# Patient Record
Sex: Male | Born: 1960 | Race: Black or African American | Hispanic: No | Smoking: Former smoker
Health system: Southern US, Community
[De-identification: ages and names within clinical notes are randomized; demographics above are authoritative.]

## PROBLEM LIST (undated history)

## (undated) DIAGNOSIS — J189 Pneumonia, unspecified organism: Secondary | ICD-10-CM

## (undated) DIAGNOSIS — J45909 Unspecified asthma, uncomplicated: Secondary | ICD-10-CM

## (undated) DIAGNOSIS — I82409 Acute embolism and thrombosis of unspecified deep veins of unspecified lower extremity: Secondary | ICD-10-CM

## (undated) DIAGNOSIS — I10 Essential (primary) hypertension: Secondary | ICD-10-CM

## (undated) DIAGNOSIS — Z9989 Dependence on other enabling machines and devices: Secondary | ICD-10-CM

## (undated) DIAGNOSIS — Z86718 Personal history of other venous thrombosis and embolism: Secondary | ICD-10-CM

## (undated) DIAGNOSIS — I509 Heart failure, unspecified: Secondary | ICD-10-CM

## (undated) DIAGNOSIS — N189 Chronic kidney disease, unspecified: Secondary | ICD-10-CM

## (undated) DIAGNOSIS — N289 Disorder of kidney and ureter, unspecified: Secondary | ICD-10-CM

## (undated) DIAGNOSIS — G4733 Obstructive sleep apnea (adult) (pediatric): Secondary | ICD-10-CM

## (undated) DIAGNOSIS — M109 Gout, unspecified: Secondary | ICD-10-CM

## (undated) DIAGNOSIS — I119 Hypertensive heart disease without heart failure: Secondary | ICD-10-CM

## (undated) DIAGNOSIS — I2699 Other pulmonary embolism without acute cor pulmonale: Secondary | ICD-10-CM

## (undated) DIAGNOSIS — G4730 Sleep apnea, unspecified: Secondary | ICD-10-CM

## (undated) DIAGNOSIS — J449 Chronic obstructive pulmonary disease, unspecified: Secondary | ICD-10-CM

## (undated) DIAGNOSIS — I480 Paroxysmal atrial fibrillation: Secondary | ICD-10-CM

## (undated) HISTORY — DX: Disorder of kidney and ureter, unspecified: N28.9

## (undated) HISTORY — DX: Sleep apnea, unspecified: G47.30

## (undated) HISTORY — DX: Acute embolism and thrombosis of unspecified deep veins of unspecified lower extremity: I82.409

## (undated) HISTORY — DX: Personal history of other venous thrombosis and embolism: Z86.718

## (undated) HISTORY — DX: Paroxysmal atrial fibrillation: I48.0

## (undated) HISTORY — DX: Hypertensive heart disease without heart failure: I11.9

## (undated) HISTORY — DX: Chronic kidney disease, unspecified: N18.9

## (undated) HISTORY — DX: Unspecified asthma, uncomplicated: J45.909

## (undated) HISTORY — DX: Chronic obstructive pulmonary disease, unspecified: J44.9

---

## 1990-03-24 HISTORY — PX: UMBILICAL HERNIA REPAIR: SHX196

## 2006-05-28 ENCOUNTER — Emergency Department: Payer: Self-pay | Admitting: Emergency Medicine

## 2006-05-28 ENCOUNTER — Other Ambulatory Visit: Payer: Self-pay

## 2006-06-08 ENCOUNTER — Ambulatory Visit: Payer: Self-pay | Admitting: Internal Medicine

## 2006-06-13 ENCOUNTER — Ambulatory Visit: Payer: Self-pay | Admitting: Internal Medicine

## 2007-02-08 ENCOUNTER — Emergency Department: Payer: Self-pay | Admitting: Emergency Medicine

## 2007-02-12 ENCOUNTER — Emergency Department: Payer: Self-pay | Admitting: Emergency Medicine

## 2007-02-16 ENCOUNTER — Emergency Department: Payer: Self-pay | Admitting: Emergency Medicine

## 2007-02-21 ENCOUNTER — Emergency Department: Payer: Self-pay | Admitting: Emergency Medicine

## 2007-02-24 ENCOUNTER — Ambulatory Visit: Payer: Self-pay | Admitting: Cardiology

## 2007-02-25 ENCOUNTER — Ambulatory Visit: Payer: Self-pay | Admitting: Cardiology

## 2007-02-25 ENCOUNTER — Encounter: Payer: Self-pay | Admitting: Cardiology

## 2007-02-25 ENCOUNTER — Inpatient Hospital Stay (HOSPITAL_COMMUNITY): Admission: EM | Admit: 2007-02-25 | Discharge: 2007-02-27 | Payer: Self-pay | Admitting: Emergency Medicine

## 2007-08-30 ENCOUNTER — Observation Stay (HOSPITAL_COMMUNITY): Admission: EM | Admit: 2007-08-30 | Discharge: 2007-08-31 | Payer: Self-pay | Admitting: Emergency Medicine

## 2007-08-31 ENCOUNTER — Ambulatory Visit: Payer: Self-pay | Admitting: Internal Medicine

## 2007-08-31 ENCOUNTER — Encounter (INDEPENDENT_AMBULATORY_CARE_PROVIDER_SITE_OTHER): Payer: Self-pay | Admitting: Cardiovascular Disease

## 2007-09-01 ENCOUNTER — Other Ambulatory Visit: Payer: Self-pay | Admitting: *Deleted

## 2007-09-01 ENCOUNTER — Encounter (INDEPENDENT_AMBULATORY_CARE_PROVIDER_SITE_OTHER): Payer: Self-pay | Admitting: *Deleted

## 2008-01-24 ENCOUNTER — Emergency Department (HOSPITAL_COMMUNITY): Admission: EM | Admit: 2008-01-24 | Discharge: 2008-01-24 | Payer: Self-pay | Admitting: Emergency Medicine

## 2008-02-22 ENCOUNTER — Emergency Department (HOSPITAL_COMMUNITY): Admission: EM | Admit: 2008-02-22 | Discharge: 2008-02-22 | Payer: Self-pay | Admitting: Emergency Medicine

## 2008-03-24 DIAGNOSIS — I2699 Other pulmonary embolism without acute cor pulmonale: Secondary | ICD-10-CM

## 2008-03-24 HISTORY — DX: Other pulmonary embolism without acute cor pulmonale: I26.99

## 2008-05-17 ENCOUNTER — Encounter: Payer: Self-pay | Admitting: Internal Medicine

## 2008-08-22 ENCOUNTER — Encounter: Payer: Self-pay | Admitting: Internal Medicine

## 2008-08-29 ENCOUNTER — Inpatient Hospital Stay (HOSPITAL_COMMUNITY): Admission: EM | Admit: 2008-08-29 | Discharge: 2008-08-31 | Payer: Self-pay | Admitting: Emergency Medicine

## 2008-08-29 ENCOUNTER — Ambulatory Visit: Payer: Self-pay | Admitting: Vascular Surgery

## 2008-08-29 ENCOUNTER — Encounter (INDEPENDENT_AMBULATORY_CARE_PROVIDER_SITE_OTHER): Payer: Self-pay | Admitting: Internal Medicine

## 2008-10-11 ENCOUNTER — Other Ambulatory Visit: Payer: Self-pay | Admitting: Internal Medicine

## 2008-11-01 ENCOUNTER — Other Ambulatory Visit: Payer: Self-pay | Admitting: Family Medicine

## 2009-05-07 ENCOUNTER — Emergency Department (HOSPITAL_COMMUNITY): Admission: EM | Admit: 2009-05-07 | Discharge: 2009-05-07 | Payer: Self-pay | Admitting: Emergency Medicine

## 2009-08-10 ENCOUNTER — Emergency Department (HOSPITAL_COMMUNITY): Admission: EM | Admit: 2009-08-10 | Discharge: 2009-08-10 | Payer: Self-pay | Admitting: Emergency Medicine

## 2010-05-14 ENCOUNTER — Emergency Department (HOSPITAL_COMMUNITY): Payer: Managed Care, Other (non HMO)

## 2010-05-14 ENCOUNTER — Inpatient Hospital Stay (HOSPITAL_COMMUNITY)
Admission: EM | Admit: 2010-05-14 | Discharge: 2010-05-16 | DRG: 310 | Disposition: A | Discharge status: Home / Self Care | Payer: Managed Care, Other (non HMO) | Attending: Internal Medicine | Admitting: Internal Medicine

## 2010-05-14 ENCOUNTER — Emergency Department (HOSPITAL_COMMUNITY)
Admission: EM | Admit: 2010-05-14 | Discharge: 2010-05-14 | Disposition: A | Discharge status: Home / Self Care | Payer: Managed Care, Other (non HMO) | Attending: Emergency Medicine | Admitting: Emergency Medicine

## 2010-05-14 DIAGNOSIS — R059 Cough, unspecified: Secondary | ICD-10-CM | POA: Insufficient documentation

## 2010-05-14 DIAGNOSIS — R05 Cough: Secondary | ICD-10-CM | POA: Insufficient documentation

## 2010-05-14 DIAGNOSIS — M79609 Pain in unspecified limb: Secondary | ICD-10-CM | POA: Diagnosis present

## 2010-05-14 DIAGNOSIS — G4733 Obstructive sleep apnea (adult) (pediatric): Secondary | ICD-10-CM | POA: Diagnosis present

## 2010-05-14 DIAGNOSIS — R748 Abnormal levels of other serum enzymes: Secondary | ICD-10-CM | POA: Diagnosis present

## 2010-05-14 DIAGNOSIS — N289 Disorder of kidney and ureter, unspecified: Secondary | ICD-10-CM | POA: Diagnosis present

## 2010-05-14 DIAGNOSIS — J209 Acute bronchitis, unspecified: Secondary | ICD-10-CM | POA: Diagnosis present

## 2010-05-14 DIAGNOSIS — J45909 Unspecified asthma, uncomplicated: Secondary | ICD-10-CM | POA: Insufficient documentation

## 2010-05-14 DIAGNOSIS — R071 Chest pain on breathing: Secondary | ICD-10-CM | POA: Insufficient documentation

## 2010-05-14 DIAGNOSIS — I10 Essential (primary) hypertension: Secondary | ICD-10-CM | POA: Diagnosis present

## 2010-05-14 DIAGNOSIS — I498 Other specified cardiac arrhythmias: Principal | ICD-10-CM | POA: Diagnosis present

## 2010-05-14 DIAGNOSIS — E876 Hypokalemia: Secondary | ICD-10-CM | POA: Diagnosis present

## 2010-05-14 DIAGNOSIS — Z86718 Personal history of other venous thrombosis and embolism: Secondary | ICD-10-CM

## 2010-05-14 DIAGNOSIS — E860 Dehydration: Secondary | ICD-10-CM | POA: Diagnosis present

## 2010-05-14 LAB — CBC
HCT: 47.5 % (ref 39.0–52.0)
Hemoglobin: 15.1 g/dL (ref 13.0–17.0)
MCH: 26.6 pg (ref 26.0–34.0)
RBC: 5.67 MIL/uL (ref 4.22–5.81)

## 2010-05-14 LAB — BASIC METABOLIC PANEL
BUN: 26 mg/dL — ABNORMAL HIGH (ref 6–23)
Calcium: 9 mg/dL (ref 8.4–10.5)
Creatinine, Ser: 2.54 mg/dL — ABNORMAL HIGH (ref 0.4–1.5)
GFR calc Af Amer: 33 mL/min — ABNORMAL LOW (ref >60)
Potassium: 4.1 mEq/L (ref 3.5–5.1)
Sodium: 138 mEq/L (ref 135–145)

## 2010-05-14 LAB — COMPREHENSIVE METABOLIC PANEL
ALT: 22 U/L (ref 0–53)
AST: 32 U/L (ref 0–37)
Albumin: 3.5 g/dL (ref 3.5–5.2)
CO2: 32 mEq/L (ref 19–32)
Chloride: 95 mEq/L — ABNORMAL LOW (ref 96–112)
Creatinine, Ser: 2.47 mg/dL — ABNORMAL HIGH (ref 0.4–1.5)
GFR calc Af Amer: 34 mL/min — ABNORMAL LOW (ref >60)
GFR calc non Af Amer: 28 mL/min — ABNORMAL LOW (ref >60)
Sodium: 138 mEq/L (ref 135–145)
Total Bilirubin: 0.5 mg/dL (ref 0.3–1.2)

## 2010-05-14 LAB — CARDIAC PANEL(CRET KIN+CKTOT+MB+TROPI)
CK, MB: 14.6 ng/mL (ref 0.3–4.0)
Relative Index: 2.6 — ABNORMAL HIGH (ref 0.0–2.5)

## 2010-05-14 LAB — BRAIN NATRIURETIC PEPTIDE: Pro B Natriuretic peptide (BNP): 36.9 pg/mL (ref 0.0–100.0)

## 2010-05-14 LAB — D-DIMER, QUANTITATIVE: D-Dimer, Quant: 0.4 ug/mL-FEU (ref 0.00–0.48)

## 2010-05-14 LAB — DIFFERENTIAL
Basophils Absolute: 0 10*3/uL (ref 0.0–0.1)
Basophils Relative: 0 % (ref 0–1)
Eosinophils Absolute: 0 10*3/uL (ref 0.0–0.7)
Eosinophils Relative: 0 % (ref 0–5)
Lymphocytes Relative: 18 % (ref 12–46)
Lymphs Abs: 0.9 10*3/uL (ref 0.7–4.0)
Monocytes Absolute: 0.2 10*3/uL (ref 0.1–1.0)

## 2010-05-14 MED ORDER — XENON XE 133 GAS
10.7000 | GAS_FOR_INHALATION | Freq: Once | RESPIRATORY_TRACT | Status: AC | PRN
  Administered 2010-05-14: 11 via RESPIRATORY_TRACT

## 2010-05-14 MED ORDER — TECHNETIUM TO 99M ALBUMIN AGGREGATED
4.0000 | Freq: Once | INTRAVENOUS | Status: AC | PRN
  Administered 2010-05-14: 4 via INTRAVENOUS

## 2010-05-15 LAB — CARDIAC PANEL(CRET KIN+CKTOT+MB+TROPI)
Total CK: 557 U/L — ABNORMAL HIGH (ref 7–232)
Troponin I: 0.12 ng/mL — ABNORMAL HIGH (ref 0.00–0.06)

## 2010-05-15 LAB — URINALYSIS, ROUTINE W REFLEX MICROSCOPIC
Bilirubin Urine: NEGATIVE
Hgb urine dipstick: NEGATIVE
Nitrite: NEGATIVE
Specific Gravity, Urine: 1.022 (ref 1.005–1.030)
Urobilinogen, UA: 1 mg/dL (ref 0.0–1.0)
pH: 5 (ref 5.0–8.0)

## 2010-05-15 LAB — BASIC METABOLIC PANEL
BUN: 25 mg/dL — ABNORMAL HIGH (ref 6–23)
Calcium: 9 mg/dL (ref 8.4–10.5)
Creatinine, Ser: 1.9 mg/dL — ABNORMAL HIGH (ref 0.4–1.5)
GFR calc Af Amer: 46 mL/min — ABNORMAL LOW (ref >60)
GFR calc non Af Amer: 38 mL/min — ABNORMAL LOW (ref >60)

## 2010-05-15 LAB — LIPID PANEL
Cholesterol: 165 mg/dL (ref 0–200)
HDL: 62 mg/dL (ref >39)
LDL Cholesterol: 91 mg/dL (ref 0–99)
Total CHOL/HDL Ratio: 2.7 RATIO
Triglycerides: 59 mg/dL (ref <150)

## 2010-05-15 LAB — HEMOGLOBIN A1C
Hgb A1c MFr Bld: 6.5 % — ABNORMAL HIGH (ref <5.7)
Mean Plasma Glucose: 140 mg/dL — ABNORMAL HIGH (ref <117)

## 2010-05-15 LAB — TSH: TSH: 0.857 u[IU]/mL (ref 0.350–4.500)

## 2010-05-15 LAB — CBC
MCH: 26.9 pg (ref 26.0–34.0)
MCHC: 31.5 g/dL (ref 30.0–36.0)
Platelets: 183 10*3/uL (ref 150–400)

## 2010-05-15 LAB — HEPARIN LEVEL (UNFRACTIONATED): Heparin Unfractionated: 0.67 IU/mL (ref 0.30–0.70)

## 2010-05-15 LAB — NA AND K (SODIUM & POTASSIUM), RAND UR: Potassium Urine: 65 mEq/L

## 2010-05-15 LAB — CREATININE, URINE, RANDOM: Creatinine, Urine: 231.5 mg/dL

## 2010-05-16 LAB — CBC
HCT: 44.3 % (ref 39.0–52.0)
MCH: 26.4 pg (ref 26.0–34.0)
MCV: 86.5 fL (ref 78.0–100.0)
RDW: 13.4 % (ref 11.5–15.5)
WBC: 6.3 10*3/uL (ref 4.0–10.5)

## 2010-05-16 LAB — URINE CULTURE: Culture  Setup Time: 201202220420

## 2010-05-30 NOTE — Discharge Summary (Signed)
NAMETARRANCE, JAGIELLO NO.:  [id number]  MEDICAL RECORD NO.:  [id number]           PATIENT TYPE:  I  LOCATION:  [id number]                         FACILITY:  [facility]:  Leana Gamer, MDDATE OF BIRTH:  January 06, 1961  DATE OF ADMISSION:  05/14/2010 DATE OF DISCHARGE:  05/16/2010                              DISCHARGE SUMMARY   DISCHARGE DISPOSITION:  Home.  FINAL DISCHARGE DIAGNOSES: 1. Nonsustained supraventricular tachycardia. 2. Obstructive sleep apnea. 3. Morbid obesity. 4. Hypertension. 5. Acute bronchitis. 6. Hypokalemia, resolved. 7. Abnormal cardiac enzymes, felt to be nonischemic. 8. Remote history of pulmonary emboli and deep vein thrombosis. 9. Acute renal insufficiency, resolved. 10.Dehydration, resolved.  DISCHARGE MEDICATIONS: 1. Albuterol inhaler 1 puff q.4h. p.r.n. 2. Prednisone taper over 4 days from 40 mg down to 10 mg p.o. daily. 3. Clonidine 0.1 mg p.o. daily. 4. Hydrochlorothiazide 25 mg p.o. daily. 5. Norvasc 10 mg p.o. daily. 6. Potassium chloride 20 mEq p.o. daily.  CONSULTANTS:  Ezzard Standing, MD, Cardiology  PROCEDURES:  None.  DIAGNOSTIC STUDIES: 1. A 12-lead EKG which shows normal sinus rhythm on today. 2. V/Q scan which showed low probability for pulmonary embolus. 3. Two-view chest x-ray which shows mild cardiomegaly and no active     disease. 4. Two-view chest x-ray which shows no acute disease.  PRIMARY CARE PHYSICIAN:  Unassigned in this area.  Primary physician in [place], [place].  The patient unable to give the doctor's name.  CODE STATUS:  Full code.  ALLERGIES:  CONTRAST MEDIA.  CHIEF COMPLAINTS:  Chest tightness, left arm pain, and diaphoresis.  HISTORY OF PRESENT ILLNESS:  Please refer to the H and P by Dr. Raelyn Number for details of the HPI.  However, in short this is a very morbidly obese gentleman with a history of remote DVT and pulmonary embolus, who presented with chest pain and the  above symptoms.  HOSPITAL COURSE: 1. Chest tightness, left arm pain, and diaphoresis.  The patient had     his cardiac enzymes cycled.  He had some mild elevation in his     cardiac enzymes and Dr. Wynonia Lawman from Cardiology was consulted.  He     felt that the elevation of cardiac enzymes were noncardiac and were     likely related to his renal function.  He also diagnosed the     patient with nonsustained supraventricular tachycardia.  It was     felt that the patient's symptoms were likely secondary to the NSVT.     The patient was started on a Cardizem drip, which controlled the     rate and was then discontinued.  He was continued on his clonidine     0.1.  On today, the patient has a normal sinus rhythm on EKG.  He     is ambulating without any symptoms and his heart rate is controlled     in the 80s.  The patient is to follow up with his primary care     physician for further evaluation and management. 2. Acute bronchitis.  The patient did have some wheezing felt to  be     secondary to acute bronchitis.  The patient apparently had been     started on a prednisone taper and unbeknownst was taking his     prednisone here in the hospital.  The patient is continued on a     prednisone taper over the next 4 days.  The patient also has an     albuterol inhaler to be used as needed. 3. Hypertension.  The patient does have hypertension.  However, I will     defer to his primary care physician for further titration.  He is     continued on his clonidine, Norvasc, and hydrochlorothiazide. 4. Hypokalemia.  The patient had some mild derangements in his     potassium and this has been replaced.  He is continued on potassium     supplementation. 5. Morbid obesity, obstructive sleep apnea.  The patient's body     habitus certainly lends itself a diagnosis of obstructive sleep     apnea.  The patient states that he was studied on a polysomnogram     approximately 2 years ago and was given a  prescription for CPAP.     He states, however, that he had wore the CPAP in the hospital and     he could not tolerate it, thus he did not continue with the CPAP.     I had a long discussion with the patient and he is in agreement     with it.  We are arranging for him to have an outpatient     polysomnogram and to proceed further from there.  Condition at time of discharge is stable.  Vital signs as follows; temperature 98.6, heart rate 80, blood pressure 172/83, respiratory rate 20, O2 sats are 92% on room air.  The patient does have sats dropping into the 80s during sleep and will need to have O2 available for sleep overnight.  Lungs, the patient only has some very, very incidental and occasional mild diffuse wheezing. Cardiovascular, he has got a normal S1 and S2.  He has normal sinus rhythm on the monitor.  There are no murmurs, rubs, or gallops noted. He is also in normal sinus rhythm with a 12-lead EKG.  Abdomen is morbidly obese, soft, nontender, nondistended.  I cannot appreciate any masses or hepatosplenomegaly.  Extremities show no clubbing, cyanosis, or edema.  Neurological, there no focal neurological deficits.  Cranial nerves II-XII are grossly intact.  Psychiatric, he is alert and oriented x3.  Good insight and cognition with recent and remote recall.  FOLLOWUP:  The patient will follow up with his primary care physician within 1 week.  The patient will also follow up for his outpatient polysomnogram for further treatment of his obstructive sleep apnea.  Total time for this discharge is greater than 30 minutes.     Leana Gamer, MD     MAM/MEDQ  D:  05/16/2010  T:  05/16/2010  Job:  [id number]  Electronically Signed by Liston Alba MD on 05/29/2010 09:50:55 PM

## 2010-06-10 LAB — POCT I-STAT, CHEM 8
BUN: 18 mg/dL (ref 6–23)
Creatinine, Ser: 1.3 mg/dL (ref 0.4–1.5)
Potassium: 3 mEq/L — ABNORMAL LOW (ref 3.5–5.1)
Sodium: 139 mEq/L (ref 135–145)

## 2010-06-10 LAB — DIFFERENTIAL
Eosinophils Relative: 1 % (ref 0–5)
Lymphocytes Relative: 33 % (ref 12–46)
Lymphs Abs: 1.5 10*3/uL (ref 0.7–4.0)
Monocytes Relative: 8 % (ref 3–12)
Neutrophils Relative %: 57 % (ref 43–77)

## 2010-06-10 LAB — CBC
HCT: 40.6 % (ref 39.0–52.0)
Platelets: 178 10*3/uL (ref 150–400)
RBC: 4.8 MIL/uL (ref 4.22–5.81)
WBC: 4.7 10*3/uL (ref 4.0–10.5)
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
## 2010-06-19 ENCOUNTER — Ambulatory Visit (HOSPITAL_BASED_OUTPATIENT_CLINIC_OR_DEPARTMENT_OTHER): Payer: Self-pay

## 2010-06-19 ENCOUNTER — Ambulatory Visit (HOSPITAL_BASED_OUTPATIENT_CLINIC_OR_DEPARTMENT_OTHER): Payer: Managed Care, Other (non HMO) | Attending: Internal Medicine

## 2010-06-19 DIAGNOSIS — G4733 Obstructive sleep apnea (adult) (pediatric): Secondary | ICD-10-CM | POA: Insufficient documentation

## 2010-06-22 DIAGNOSIS — G4733 Obstructive sleep apnea (adult) (pediatric): Secondary | ICD-10-CM

## 2010-06-22 DIAGNOSIS — R0609 Other forms of dyspnea: Secondary | ICD-10-CM

## 2010-06-22 DIAGNOSIS — R0989 Other specified symptoms and signs involving the circulatory and respiratory systems: Secondary | ICD-10-CM

## 2010-07-01 LAB — BASIC METABOLIC PANEL
BUN: 8 mg/dL (ref 6–23)
BUN: 9 mg/dL (ref 6–23)
CO2: 36 mEq/L — ABNORMAL HIGH (ref 19–32)
CO2: 36 mEq/L — ABNORMAL HIGH (ref 19–32)
Calcium: 8.5 mg/dL (ref 8.4–10.5)
Calcium: 8.8 mg/dL (ref 8.4–10.5)
Calcium: 9.3 mg/dL (ref 8.4–10.5)
Chloride: 100 mEq/L (ref 96–112)
Creatinine, Ser: 1.26 mg/dL (ref 0.4–1.5)
Creatinine, Ser: 1.3 mg/dL (ref 0.4–1.5)
Creatinine, Ser: 1.42 mg/dL (ref 0.4–1.5)
GFR calc Af Amer: >60 mL/min (ref >60)
GFR calc Af Amer: >60 mL/min (ref >60)
GFR calc non Af Amer: 53 mL/min — ABNORMAL LOW (ref >60)
GFR calc non Af Amer: >60 mL/min (ref >60)
Glucose, Bld: 106 mg/dL — ABNORMAL HIGH (ref 70–99)
Sodium: 142 mEq/L (ref 135–145)
Sodium: 142 mEq/L (ref 135–145)

## 2010-07-01 LAB — CBC
Hemoglobin: 13.6 g/dL (ref 13.0–17.0)
MCHC: 32.7 g/dL (ref 30.0–36.0)
MCHC: 33 g/dL (ref 30.0–36.0)
MCV: 84.2 fL (ref 78.0–100.0)
Platelets: 159 10*3/uL (ref 150–400)
Platelets: 161 10*3/uL (ref 150–400)
Platelets: 179 10*3/uL (ref 150–400)
RBC: 4.46 MIL/uL (ref 4.22–5.81)
RBC: 4.89 MIL/uL (ref 4.22–5.81)
RDW: 14.4 % (ref 11.5–15.5)
RDW: 14.6 % (ref 11.5–15.5)
RDW: 14.7 % (ref 11.5–15.5)
RDW: 15.1 % (ref 11.5–15.5)

## 2010-07-01 LAB — DIFFERENTIAL
Basophils Absolute: 0 10*3/uL (ref 0.0–0.1)
Basophils Relative: 0 % (ref 0–1)
Eosinophils Absolute: 0 10*3/uL (ref 0.0–0.7)
Lymphocytes Relative: 22 % (ref 12–46)
Monocytes Absolute: 0.5 10*3/uL (ref 0.1–1.0)
Monocytes Relative: 7 % (ref 3–12)
Monocytes Relative: 9 % (ref 3–12)
Neutro Abs: 4.9 10*3/uL (ref 1.7–7.7)

## 2010-07-01 LAB — PROTIME-INR
INR: 1.1 (ref 0.00–1.49)
INR: 1.1 (ref 0.00–1.49)
Prothrombin Time: 14.4 seconds (ref 11.6–15.2)
Prothrombin Time: 14.6 seconds (ref 11.6–15.2)

## 2010-07-01 LAB — D-DIMER, QUANTITATIVE: D-Dimer, Quant: 15.58 ug/mL-FEU — ABNORMAL HIGH (ref 0.00–0.48)

## 2010-07-01 LAB — BLOOD GAS, ARTERIAL
Bicarbonate: 34.2 mEq/L — ABNORMAL HIGH (ref 20.0–24.0)
pH, Arterial: 7.383 (ref 7.350–7.450)
pO2, Arterial: 77.4 mmHg — ABNORMAL LOW (ref 80.0–100.0)

## 2010-07-01 LAB — BRAIN NATRIURETIC PEPTIDE: Pro B Natriuretic peptide (BNP): <30 pg/mL (ref 0.0–100.0)

## 2010-07-01 LAB — POCT CARDIAC MARKERS: Troponin i, poc: <0.05 ng/mL (ref 0.00–0.09)

## 2010-07-01 LAB — APTT: aPTT: 51 seconds — ABNORMAL HIGH (ref 24–37)

## 2010-07-01 LAB — CK TOTAL AND CKMB (NOT AT ARMC): Total CK: 597 U/L — ABNORMAL HIGH (ref 7–232)

## 2010-07-01 LAB — CARDIAC PANEL(CRET KIN+CKTOT+MB+TROPI)
Relative Index: 0.9 (ref 0.0–2.5)
Total CK: 547 U/L — ABNORMAL HIGH (ref 7–232)
Troponin I: 0.06 ng/mL (ref 0.00–0.06)

## 2010-07-28 ENCOUNTER — Emergency Department (HOSPITAL_COMMUNITY)
Admission: EM | Admit: 2010-07-28 | Discharge: 2010-07-28 | Disposition: A | Discharge status: Home / Self Care | Payer: Managed Care, Other (non HMO) | Attending: Emergency Medicine | Admitting: Emergency Medicine

## 2010-07-28 DIAGNOSIS — M109 Gout, unspecified: Secondary | ICD-10-CM | POA: Insufficient documentation

## 2010-07-28 DIAGNOSIS — J45909 Unspecified asthma, uncomplicated: Secondary | ICD-10-CM | POA: Insufficient documentation

## 2010-07-28 DIAGNOSIS — I10 Essential (primary) hypertension: Secondary | ICD-10-CM | POA: Insufficient documentation

## 2010-08-06 NOTE — Discharge Summary (Signed)
Phillip [name], Phillip [name] NO.:  [id number]   MEDICAL RECORD NO.:  [id number]          PATIENT TYPE:  INP   LOCATION:  [id number]                         FACILITY:  [facility]   PHYSICIAN:  Neysa Bonito, MD  DATE OF BIRTH:  09-Apr-1960   DATE OF ADMISSION:  08/28/2008  DATE OF DISCHARGE:                               DISCHARGE SUMMARY   PRIMARY CARE PHYSICIAN:  Unassigned to [facility].   CHIEF COMPLAINT:  Shortness of breath.   HISTORY OF PRESENT ILLNESS:  This is a 49 year old morbidly obese, very  pleasant African American male with history significant for bronchial  asthma who presented with shortness of breath and left lower extremity  swelling.  The patient was diagnosed with PE as per the CT angiogram and  with popliteal DVT on the lower extremity Doppler.  The patient was  started on Coumadin and Lovenox, and currently he is stable for  discharge.  He does not require oxygen.  The patient is eager to leave  to home, although he does not have primary physician in our area.  He  stated that he called his insurance company, and they gave him a list he  can access on the Internet and he wanted to follow up the INR as  outpatient.  I had a lengthy discussion in regards to the follow-up and  the side effects of Coumadin, and beside the fact that the patient had  Coumadin and anticoagulation  material.  He is aware of the risk  regarding to the management of his Coumadin.   DISCHARGE DIAGNOSES:  1. Bilateral lower lobes pulmonary embolism.  2. Left lower extremity deep venous thrombosis.  3. Hypertension.  4. Morbid obesity.  5. History of bronchial asthma.   DISCHARGE MEDICATIONS:  1. Lovenox 180 mg for 3 more days.  2. Coumadin 15 mg nightly.  INR should be followed up no later than      Monday.  The patient is aware.  3. Hydrochlorothiazide 25 mg daily.  4. Norvasc 10 mg daily.  5. Clonidine 0.1 mg p.o. daily.  6. Potassium chloride 20 mEq p.o. daily.  7. Albuterol  inhaler 2 puffs q.i.d. p.r.n.  8. Nasal cannula oxygen 4 liters at night.   HOSPITAL PROCEDURES AND RESULTS:  CT angiogram on June 29, 2008  impression:  Is showing bilateral pulmonary emboli.   DISCHARGE PLAN:  The patient was discharged to follow up with his  primary physician.  Again the importance of follow-up of the INR is  stressed and explained to the patient thoroughly.  The patient is aware  and agreeable to the discharge and follow-up plans.      Neysa Bonito, MD  Electronically Signed     EME/MEDQ  D:  08/31/2008  T:  08/31/2008  Job:  [id number]
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
## 2010-08-06 NOTE — H&P (Signed)
Phillip [name], Phillip [name] NO.:  [id number]  MEDICAL RECORD NO.:  [id number]           PATIENT TYPE:  E  LOCATION:  WLED                         FACILITY:  [facility]  PHYSICIAN:  Lyn Records, MD      DATE OF BIRTH:  1961/03/08  DATE OF ADMISSION:  05/14/2010 DATE OF DISCHARGE:                             HISTORY & PHYSICAL   PRIMARY CARE PHYSICIAN:  Unassigned.  CHIEF COMPLAINT:  Chest tightness, left arm pain, diaphoresis  HISTORY OF PRESENT ILLNESS:  The patient is a 50 year old man with a history of PE in June 2010, chronic DVT, hypertension, OSA not on CPAP, morbid obesity who presents with an episode of sudden-onset diaphoresis "go feeling clammy," left arm pain, palpitations, dizziness, and chest tightness today that lasted from 12:30 p.m. to 2:30 p.m. and resolved when he got to the emergency room and was given aspirin and nitroglycerin.  The patient denies any syncope.  He does also report dyspnea on exertion with activities for the past 3 years, however, reports that it is worse intermittently.  He denies PND, orthopnea at this time. He was actually seen in the emergency department yesterday for some "congestion," chest tightness and was diagnosed with asthma exacerbation, however, these symptoms  feel different today.  The patient reports that his symptoms are also not similar to when he had his prior PE either, although he continues to report an occasional dull pain in the left lower part of his chest.  No fever, chills.  No recent illnesses.  No other acute complaints.  No lower extremity edema, although the patient reports that if he sits on a bar stool for a long time, he does get some numbness in his left leg.  REVIEW OF SYSTEMS:  Review of 10-organ systems is done is negative except as stated above in the HPI.  ALLERGIES:  No known drug allergies.  MEDICATIONS: 1. Clonidine 0.1 mg daily. 2. Norvasc 10 mg daily. 3. Hydrochlorothiazide 25 mg  daily. 4. KCl 20 mEq daily. 5. Albuterol as needed.  PAST MEDICAL HISTORY: 1. Asthma, PE in June 2010. 2. DVT (chronic). 3. Hypertension. 4. OSA, not on CPAP. 5. Hernia repair in 1992.  SOCIAL HISTORY:  Nonsmoker, quit more than 20 years ago.  No alcohol.  FAMILY HISTORY:  Father had heart transplant for what sounds like ischemic cardiomyopathy.  PHYSICAL EXAMINATION:  VITAL SIGNS:  Blood pressure 149/74, pulse 140, respirations 20, temperature 98.1. GENERAL:  The patient is in no acute distress. HEENT:  Mucous membranes moist.  Sclerae anicteric. CV:  Tachy.  No murmurs. LUNGS:  Clear to auscultation bilaterally.  No wheezes.  Nonlabored respiratory effort. ABDOMEN:  Obese, soft, nontender, nondistended. EXTREMITIES:  No lower extremity edema, although it is difficult to tell due to the patient's body habitus. NEUROLOGIC:  Cranial nerves II through XII grossly intact and nonfocal. SKIN:  No rashes. CHEST:  Nontender to palpation.  LABORATORY DATA:  White count 5.1, hemoglobin 15.1, platelets 221.  INR 0.95, troponin is 0.06, CK is 596, CK-MB 11.7.  Sodium 138, potassium 4.1, chloride 97, bicarbonate 28, BUN 26, creatinine  2.54 and prior creatinine was 1.26, glucose 224.  Chest x-ray is negative.  Echo from 2009 shows a normal EF, although it was a difficult study so they did not comment on much else.  ASSESSMENT AND PLAN:  The patient is a 50 year old man with a history of pulmonary embolism, deep vein thrombosis, hypertension, untreated sleep apnea, morbid obesity who presents with an episode of diaphoresis, chest pain, left arm pain, and palpitations today. 1. Chest tightness, dyspnea on exertion, left arm pain, diaphoresis,     palpitations.  At this time, his constellation of symptoms could     represent a recurrent pulmonary embolism versus an acute coronary     syndrome versus asthma versus other.  The patient's EKG at this     time is showing a narrow complex  regular supraventricular     tachycardia, but his EKG does not show any acute ischemic changes.     At this time, the most likely arrhythmia is 2:1 flutter that     appears too slow and the EKG is not classic for atrioventricular     nodal reentrant tachycardia.  The patient's first troponin is     negative, however, his CK-MB is slightly elevated.  I have     consulted Cardiology and they will see the patient. It is difficult     to determine a rate control strategy for this patient as he was     just seen in the emergency department yesterday for asthma, so we     would not want to start a beta-blocker.  However, he could also     have been diagnosed heart failure, so I would not want to use a     calcium channel blocker.  We will defer rate controlling options to     Cardiology.  At this point, the patient's vital signs are stable.     We will put him on empiric heparin drip for now for the elevated CK-     MB or possible acute coronary syndrome and also empiric treatment     for possible pulmonary embolism.  We will check a stat D-dimer.  We     will check a BNP.  Given the patient's symptoms of dyspnea on     exertion, we will check a TSH.  Given the tachycardia, I have also     ordered an echo to be done in the morning to evaluate the patient's     EF.  If the D-dimer is positive, we will check a V/Q scan.  The     patient's elevated creatinine makes it impossible to get a contrast     CT angio.  At this time, the patient does not have any symptoms and     feels better.  We will monitor the patient on tele, check serial     cardiac enzymes.  We will check a fasting lipid panel and     hemoglobin A1c to risk stratify him. 2. Acute kidney injury.  The patient's creatinine is up to 2.54 from a     baseline of 1.26.  We will check UA, urine culture, urine     electrolytes, and do a fluid challenge.  Recheck his creatinine in     the morning and hold his diuretics. 3. Hypertension.  We  will continue the patient's home clonidine,     amlodipine.  We will hold his home hydrochlorothiazide for the     acute kidney injury, as  above.  He may need to be started on     additional agents of his blood pressure, it is not controlled on     the above regimen. 4. Asthma.  The patient is currently stable from this standpoint.  We     will put him on as needed albuterol, may need to switch to Xopenex     if the patient continues to be tachycardic. 5. Fluid, electrolytes, nutrition.  Fluid challenge, as above.     Replete electrolytes as needed.  We will put the patient on heart-     healthy diet. 6. Prophylaxis.  The patient will be on a heparin drip, and we will     ask this to discontinue, he will need to be put on Lovenox for deep     vein thrombosis prophylaxis.  DISPOSITION:  The patient is full code.          ______________________________ Lyn Records, MD     JF/MEDQ  D:  05/14/2010  T:  05/14/2010  Job:  [id number]  Electronically Signed by Lyn Records MD on 08/06/2010 08:22:38 PM

## 2010-08-06 NOTE — H&P (Signed)
[name], Phillip [name] NO.:  [id number]   MEDICAL RECORD NO.:  [id number]          PATIENT TYPE:  INP   LOCATION:  [facility]                         FACILITY:  [facility]   PHYSICIAN:  Karma Lew, MD          DATE OF BIRTH:  02-12-1961   DATE OF ADMISSION:  02/24/2007  DATE OF DISCHARGE:                              HISTORY & PHYSICAL   CHIEF COMPLAINT:  Cough and nausea times one week.   HISTORY OF PRESENT ILLNESS:  The patient is a 50 year old male with no  significant cardiac history, who comes into the ER with complaints of  cough for the past month as well as nausea. Incidentally, the patient  also reported that he has had dyspnea on exertion for the past year as  well as some mild lower extremity edema that has worsened over the past  couple of weeks.  He has had a prior evaluation of his cardiac function,  which has been normal. He has also had a stress test, which is a nuclear  stress test, which he describes as normal. He has also had a workup for  obstructive sleep apnea, which he was told he did have, but has not  gotten his BiPAP or C-Pap machine.  In terms of his cough, he has had no  fevers. He has had some mild sputum production and initiated over one  month ago with little benefit with cough suppressants and antacids.  The  patient denies any substernal chest pain or pressure. No syncope. No  PND. He can not lie flat  but I think it was mostly due to his obesity.   PAST MEDICAL HISTORY:  1. Morbid obesity.  2. Hypertension.  3. GERD.  4. Asthma.   SOCIAL HISTORY:  Former IT trainer, but now retired. No smoking or  alcohol. No drugs.   FAMILY HISTORY:  His father had heart transplant at age 45 secondary to  ischemic cardiomyopathy.   MEDICATIONS:  Diovan, hydrochlorothiazide, Benzonatate and Reglan.   ALLERGIES:  No known drug allergies.   REVIEW OF SYSTEMS:  Negative except otherwise dictated by the HPI.   PHYSICAL EXAMINATION:  Blood pressure  173/100, heart rate of 95,  respirations are 12, sating 98% on room air. GENERAL:  A well-developed  and well-nourished morbidly obese African-American male in no acute  distress. HEENT: Moist mucous membranes. No scleral icterus.  No  conjunctival pallor.  No lymphadenopathy. NECK: Supple. Full room of  motion. No arterial venous distention. Jugular venous distention could  not be assessed due to obesity. CARDIOVASCULAR: Regular rate and rhythm,  no rubs, murmurs or gallops. CHEST: Is clear to auscultation  bilaterally. No wheezes, rales or rhonchi. ABDOMEN: Is soft, obese, but  nontender and nondistended. Normal active bowel sounds.  EXTREMITIES:  Trace pretibial edema to 1+. NEURO EXAM: Is nonfocal.   MEDICAL DECISION MAKING:  Chest x-ray demonstrates no acute infiltrates.  No evidence of volume overload.  EKGs normal sinus rhythm with left  ventricular hypertrophy with repolarization abnormalities as well as  some nonspecific  T  wave abnormality.   LABORATORY DATA:  Normal white count. Hemoglobin 13.5. BUN and  creatinine 17 and 1.5, which is new. Glucose mildly elevated at 116,  potassium slightly low at 3.1. His natruretic peptide is 38. A CPK of  1605 with a negative MB fraction. Troponin mildly elevated at 0.12.   IMPRESSION:  1. Mild right sided heart failure symptoms likely secondary to      obstructive sleep apnea and possibly exacerbated by recent viral      illness.  2. Uncontrolled hypertension.  3. Mild troponin elevation likely secondary to acute renal failure,      however, will rule out myocardial infarction by serial enzymes.      Hypertension  4. Acute renal failure.  5. Morbid obesity.   PLAN:  Admit the patient to Dr. Nichola Sizer service. Heparin and aspirin  for now given positive troponin. Will follow the enzymes serially. I  expect this is likely due to his increased creatinine and muscle mass.  His troponin is mildly elevated and do not think he has got an  acute  coronary syndrome. Will get an echocardiogram in the morning to evaluate  right sided pressures and I assume he probably has some diastolic  dysfunction as well given his LVH and uncontrolled hypertension.  Will  also initiate him on some beta blockers for better rate control and  hypertensive control. Continue him on his hydrochlorothiazide for now.  Pending what his bio marker trend is given an asymptomatic gentleman, I  do not feel strongly to start anticoagulation and will leave this to Dr.  Olevia Perches whether or not he wants to further restratify him.  Also probably  needs evaluation for his known obstructive sleep apnea maybe some help  getting his CPAP machine titrated at home.      Karma Lew, MD  Electronically Signed     JT/MEDQ  D:  02/25/2007  T:  02/25/2007  Job:  [id number]

## 2010-08-06 NOTE — Consult Note (Signed)
NAMEJAVARIS, Phillip [name] NO.:  [id number]   MEDICAL RECORD NO.:  [id number]          PATIENT TYPE:  INP   LOCATION:  2041                         FACILITY:  [facility]   PHYSICIAN:  Christena Deem. Melvyn Novas, MD, FCCPDATE OF BIRTH:  Feb 07, 1961   DATE OF CONSULTATION:  DATE OF DISCHARGE:  09/01/2007                                 CONSULTATION   CHIEF COMPLAINT:  Dyspnea and cough.   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  At that point, he weighed about  250 pounds and has been gradually up to 389.  However, at baseline, he  can still states that he can walk half a mile, but gets short of breath  if he walks up more than 14 steps.  Acutely 1 week prior to admission,  after traveling to [place], he developed chest tightness associated  with a hacking cough for which he used his Pro-Air p.r.n. (note that he  does not normally require any maintenance therapy) and then developed  chills up to 102 fever, and while in [place], was seen in the ER and  treated empirically with antibiotics and prednisone.  The antibiotic  consisted and Zithromax.  He did improve somewhat then developed  purulent cough production with apparent sputum after he was started on  the antibiotics and prednisone and seen in the emergency room.  However,  subsequent to this, his breathing worsened and  started to notice traces  of bloody sputum and went to [facility] where there was  concern that he might have cardiac issues and therefore was admitted to  the Cardiology Service.  However, workup there did not indicate any  definite MI and the CT scan suggested bilateral pneumonia.  We were  asked therefore at that point to see him.   The patient still feels somewhat congested but is not short of breath,  ambulating around the room, which is an improvement  from baseline.  He  denies any classic pleuritic pain, active sinus complaints, ongoing  fevers, chills, sweats, leg swelling, calf pain, or difficulty with  swallowing, recent dental work, or unusual exposure history.   PAST MEDICAL HISTORY:  1. Morbid obesity complicated by sleep apnea that was diagnosed in      [place] a year ago, for which he was prescribed CPAP machine,      but never used it.  2. Hypertension.  3. Suspected reflux.  apparent clinical diagnosis   ALLERGIES:  None known.   SOCIAL HISTORY:  He quit smoking years prior to his disability injury in  1999.  He denies any alcohol history.   FAMILY HISTORY:  Positive for ischemic heart disease of his father in  his 85s.   REVIEW OF SYSTEMS:  Taken in detail and negative except as outlined  above.   PHYSICAL EXAMINATION:  This is a pleasant massively obese black male in  no acute distress.  He is afebrile here in  the hospital and normal vital  signs.  HEENT is unremarkable.  Dentition intact.  Nasal dermis normal.  Ear canals are clear bilaterally.  Neck was supple without cervical  adenopathy or tenderness.  Trachea is midline with no megaly.  Lung  fields reveal a few inspiratory and expiratory rhonchi, but no definite  bronchial changes.  Regular rate and rhythm without murmur, gallop, or  rub.  S1 and S2 were diminished.  Abdomen was massively obese, but is  benign.  Extremities are warm without calf tenderness, cyanosis,  clubbing, or edema.  Bevelyn Buckles' sign is negative.   LABORATORY DATA:  CBC was normal.  Chemistry profile on admission  revealed a BUN of 22, creatinine of 1.72, but on the day I saw him, his  BUN was 16 with a creatinine of 1.4 and Bicarbon was 38.  All of this  was on September 01, 2007.  TSH is normal.  BNP is less than 100 and has been  that times several days.   Chest x-ray does suggest increased markings over baseline which is  available from February 24, 2007, does not show convincing  evidence of  lung disease.  On CT scan, he does have patchy bronco areas suggestive  of bronchopneumonia bilaterally in the bases.   Hemoglobin saturation is adequate on room air, but he does desaturate  into the mid 80s walking.   IMPRESSION:  1. Acute exertional hypoxemia related to possible underlying chronic      obstructive pulmonary disease/asthmatic bronchitis plus morbid      obesity with poor inspiratory capacity on this basis.  I recommend      he be treated acutely for bronchopneumonia with Avelox x5 days at      400 mg per day and then follow up either with Dr. Brynda Greathouse or back      here in the Pulmonary Department at [facility] if Dr. Brynda Greathouse      prefers.  I would also recommend for now he be maintained on Advair      250/50 b.i.d. with the options to stop it  once stabilizes to      obtain a baseline  set of PFTs obtained to see whether or not he is      in need of chronic maintenance therapy (if his FEV1 is less than      50% predicted or he has more than one exacerbation a year, he would      be a candidate for maintenance therapy with Advair or Symbicort).  2. Morbid obesity complicated by chronic poor exercise tolerance and      also now sleep apnea diagnosed in the last year.  He has been      nonadherent with CPAP.  We would be happy to see him in the      Pulmonary Clinic to work out the details for why he is nonadherent      if Dr. Brynda Greathouse would prefer or alternatively he could send him to a      sleep specialist in [place].  Morbid obesity is also probably      complicated by clinical gastrointestinal reflux disease.  In this      setting, he is maintained on Protonix, but also needs to understand      to avoid menthol lozenges noting that he was chewing ment gum on my      arrival, which I strongly recommended he not do.   At this point, I do not feel  continued hospitalization is needed and he  could be supplied with oxygen for home therapy for now, but will  need  careful outpatient follow-up either by Dr. Brynda Greathouse, a designated pulmonary  doctor at Dr. Jerene Dilling discretion in [place] or we will be happy to  see him back in [facility] Pulmonary Division.      Christena Deem. Melvyn Novas, MD, [facility]  Electronically Signed     MBW/MEDQ  D:  09/01/2007  T:  09/02/2007  Job:  [id number]   cc:   Eber Hong
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
## 2010-08-09 NOTE — Discharge Summary (Signed)
NAMEJEROMIE, MEZIERE NO.:  [id number]   MEDICAL RECORD NO.:  [id number]          PATIENT TYPE:  INP   LOCATION:  [place]                         FACILITY:  [place]:  Cristopher Estimable. Lattie Haw, MD, FACCDATE OF BIRTH:  March 29, 1960   DATE OF ADMISSION:  02/25/2007  DATE OF DISCHARGE:  02/27/2007                               DISCHARGE SUMMARY   PROCEDURES:  2D echocardiogram.   PRIMARY FINAL DISCHARGE DIAGNOSIS:  Dyspnea, multifactorial, probable  upper respiratory infection.   SECONDARY DIAGNOSES:  1. Sleep apnea, pulmonary consulted.  2. Hypertension, difficult to control.  3. Obesity.  4. Gastroesophageal reflux disease.  5. Asthma,  6. Family history of coronary artery disease.  7. Status post echocardiogram this admission, with severely limited      images, right ventricle never seen, and overall left ventricular      function may be okay.  8. Hyperglycemia, with a hemoglobin A1c of 6.3.  9. Dyslipidemia, with a total cholesterol of 178, triglycerides 29,      HDL 62, LDL 110.   HOSPITAL COURSE:  Mr. Benda is a 50 year old male with no previous  history of coronary artery disease.  He came to the emergency room on  February 25, 2007 for cough, nausea, and dyspnea on exertion.  He was  admitted for further evaluation.   His cardiac enzymes were negative for MI.  His potassium was slightly  low upon admission at 3.1, but this was supplemented.  His CKs were  elevated, with a peak of 1605, and MBs were slightly elevated as well,  with a peak of 14.9, but the index was within normal limits, and  troponin I was only minimally elevated at a peak of 0.12.  A urinary  creatinine was 244.5, with a urine sodium of 110.   Mr. Fleming was continued on his home diuretics.  He was given a Z-PAK for  possible upper respiratory infection.  He was continued on his home dose  of Diovan/HCTZ and had a beta blocker added to his medication regimen.  By discharge, his blood  pressure control had improved, and his blood  pressure was 163/83.  His heart rate was running in the 70s, in sinus  rhythm.  Dr. Percival Spanish felt that he would benefit from pulmonary consult,  and this was arranged as an outpatient.  On December 6. 2008, Dr.  Lattie Haw evaluated him and felt that he could be safely discharged with  outpatient followup arranged.   DISCHARGE CONDITION:  Improved.   DISCHARGE INSTRUCTIONS:  1. He is to stick to a low-sodium, heart-healthy diet.  2. He is to follow up with Dr. Gwenette Greet on March 11, 2007 at 11:30,      and to follow up with Dr. Percival Spanish as well.   DISCHARGE MEDICATIONS:  1. Protonix 40 mg b.i.d.  2. Diovan/HCT 320/25 daily  3. Reglan 10 mg t.i.d. a.c. and at bedtime.  4. Benzonatate p.r.n.  5. Tussionex  p.r.n.  6. Toprol-XL 100 mg daily.  7. Zithromax 250 mg a day.  8. Amlodipine 5 mg q. day.  Rosaria Ferries, PA-C      Cristopher Estimable. Lattie Haw, MD, [facility]  Electronically Signed    RB/MEDQ  D:  04/15/2007  T:  04/16/2007  Job:  [id number]

## 2010-09-17 ENCOUNTER — Emergency Department (HOSPITAL_COMMUNITY)
Admission: EM | Admit: 2010-09-17 | Discharge: 2010-09-18 | Disposition: A | Discharge status: Home / Self Care | Payer: Managed Care, Other (non HMO) | Attending: Emergency Medicine | Admitting: Emergency Medicine

## 2010-09-17 ENCOUNTER — Emergency Department (HOSPITAL_COMMUNITY): Payer: Managed Care, Other (non HMO)

## 2010-09-17 DIAGNOSIS — I10 Essential (primary) hypertension: Secondary | ICD-10-CM | POA: Insufficient documentation

## 2010-09-17 DIAGNOSIS — R079 Chest pain, unspecified: Secondary | ICD-10-CM | POA: Insufficient documentation

## 2010-09-17 DIAGNOSIS — Z86718 Personal history of other venous thrombosis and embolism: Secondary | ICD-10-CM | POA: Insufficient documentation

## 2010-09-17 DIAGNOSIS — M25469 Effusion, unspecified knee: Secondary | ICD-10-CM | POA: Insufficient documentation

## 2010-09-17 DIAGNOSIS — M25569 Pain in unspecified knee: Secondary | ICD-10-CM | POA: Insufficient documentation

## 2010-09-17 DIAGNOSIS — M1990 Unspecified osteoarthritis, unspecified site: Secondary | ICD-10-CM | POA: Insufficient documentation

## 2010-09-17 DIAGNOSIS — R55 Syncope and collapse: Secondary | ICD-10-CM | POA: Insufficient documentation

## 2010-09-17 DIAGNOSIS — R0602 Shortness of breath: Secondary | ICD-10-CM | POA: Insufficient documentation

## 2010-09-17 LAB — D-DIMER, QUANTITATIVE: D-Dimer, Quant: 1.04 ug/mL-FEU — ABNORMAL HIGH (ref 0.00–0.48)

## 2010-09-18 ENCOUNTER — Ambulatory Visit (HOSPITAL_COMMUNITY)
Admission: RE | Admit: 2010-09-18 | Discharge: 2010-09-18 | Disposition: A | Discharge status: Home / Self Care | Payer: Managed Care, Other (non HMO) | Source: Ambulatory Visit | Attending: Emergency Medicine | Admitting: Emergency Medicine

## 2010-09-18 DIAGNOSIS — M79609 Pain in unspecified limb: Secondary | ICD-10-CM | POA: Insufficient documentation

## 2010-12-19 LAB — CBC
HCT: 40.8
Hemoglobin: 13.3
Hemoglobin: 13.5
Hemoglobin: 13.9
MCHC: 32.7
MCHC: 32.8
MCHC: 33.1
MCV: 83.5
MCV: 83.8
Platelets: 173
RBC: 4.85
RBC: 4.86
RBC: 5.1
RDW: 14.4
RDW: 14.4
RDW: 14.5
WBC: 6.1

## 2010-12-19 LAB — COMPREHENSIVE METABOLIC PANEL
ALT: 52
Albumin: 3.6
Alkaline Phosphatase: 51
BUN: 16
CO2: 38 — ABNORMAL HIGH
Calcium: 8.8
Creatinine, Ser: 1.41
GFR calc non Af Amer: 54 — ABNORMAL LOW
Glucose, Bld: 101 — ABNORMAL HIGH
Potassium: 2.9 — ABNORMAL LOW
Sodium: 137
Total Protein: 6.2
Total Protein: 6.3

## 2010-12-19 LAB — CARDIAC PANEL(CRET KIN+CKTOT+MB+TROPI)
CK, MB: 11.7 — ABNORMAL HIGH
CK, MB: 13.2 — ABNORMAL HIGH
Relative Index: 1.2
Relative Index: 1.2
Total CK: 1013 — ABNORMAL HIGH
Total CK: 1085 — ABNORMAL HIGH
Total CK: 967 — ABNORMAL HIGH
Troponin I: 0.07 — ABNORMAL HIGH
Troponin I: 0.08 — ABNORMAL HIGH
Troponin I: 0.08 — ABNORMAL HIGH

## 2010-12-19 LAB — BASIC METABOLIC PANEL
BUN: 18
CO2: 35 — ABNORMAL HIGH
Calcium: 8.9
Chloride: 99
Creatinine, Ser: 1.44
GFR calc Af Amer: >60
GFR calc non Af Amer: 53 — ABNORMAL LOW
Glucose, Bld: 163 — ABNORMAL HIGH
Potassium: 3.3 — ABNORMAL LOW
Sodium: 143

## 2010-12-19 LAB — LIPID PANEL
LDL Cholesterol: 71
Triglycerides: 98

## 2010-12-19 LAB — DIFFERENTIAL
Basophils Absolute: 0
Basophils Relative: 1
Eosinophils Absolute: 0
Neutro Abs: 2.9
Neutrophils Relative %: 52

## 2010-12-19 LAB — HEMOGLOBIN A1C
Hgb A1c MFr Bld: 6.3 — ABNORMAL HIGH
Mean Plasma Glucose: 147

## 2010-12-19 LAB — CK TOTAL AND CKMB (NOT AT ARMC)
CK, MB: 11.9 — ABNORMAL HIGH
Relative Index: 0.7
Relative Index: 0.7
Total CK: 1762 — ABNORMAL HIGH

## 2010-12-19 LAB — HEPARIN LEVEL (UNFRACTIONATED): Heparin Unfractionated: 1.47 — ABNORMAL HIGH

## 2010-12-19 LAB — POCT CARDIAC MARKERS
CKMB, poc: 12.8
CKMB, poc: 8.4
Troponin i, poc: <0.05
Troponin i, poc: <0.05

## 2010-12-19 LAB — POCT I-STAT, CHEM 8
Calcium, Ion: 1.08 — ABNORMAL LOW
Chloride: 97
HCT: 44
Potassium: 2.7 — CL
Sodium: 139

## 2010-12-19 LAB — TROPONIN I: Troponin I: 0.13 — ABNORMAL HIGH

## 2010-12-19 LAB — URINALYSIS, ROUTINE W REFLEX MICROSCOPIC
Nitrite: NEGATIVE
Specific Gravity, Urine: 1.023
Urobilinogen, UA: 0.2

## 2010-12-19 LAB — TSH
TSH: 0.829
TSH: 2.765

## 2010-12-19 LAB — ALDOLASE
Aldolase: 16.2 U/L — ABNORMAL HIGH (ref <=8.1)
Aldolase: 22.6 U/L — ABNORMAL HIGH (ref <=8.1)

## 2010-12-19 LAB — EXPECTORATED SPUTUM ASSESSMENT W GRAM STAIN, RFLX TO RESP C

## 2010-12-19 LAB — B-NATRIURETIC PEPTIDE (CONVERTED LAB): Pro B Natriuretic peptide (BNP): 43

## 2010-12-24 LAB — POCT I-STAT, CHEM 8
Chloride: 97
HCT: 42
Potassium: 3.5

## 2010-12-24 LAB — OCCULT BLOOD X 1 CARD TO LAB, STOOL: Fecal Occult Bld: POSITIVE

## 2010-12-30 LAB — COMPREHENSIVE METABOLIC PANEL
Albumin: 3.5
Alkaline Phosphatase: 47
BUN: 17
Chloride: 99
Glucose, Bld: 116 — ABNORMAL HIGH
Potassium: 3.1 — ABNORMAL LOW
Total Bilirubin: 1.1

## 2010-12-30 LAB — DIFFERENTIAL
Basophils Absolute: 0.1
Basophils Relative: 1
Eosinophils Absolute: 0.1 — ABNORMAL LOW
Monocytes Relative: 8
Neutro Abs: 5
Neutrophils Relative %: 65

## 2010-12-30 LAB — URINE CULTURE: Special Requests: NEGATIVE

## 2010-12-30 LAB — CBC
HCT: 40.1
HCT: 40.3
Hemoglobin: 13.5
Hemoglobin: 13.5
MCHC: 33.4
MCV: 83.4
Platelets: 191
Platelets: 194
RDW: 14.7
RDW: 14.7
WBC: 7.7
WBC: 7.8

## 2010-12-30 LAB — HEPARIN LEVEL (UNFRACTIONATED)
Heparin Unfractionated: 0.62
Heparin Unfractionated: 0.78 — ABNORMAL HIGH
Heparin Unfractionated: 0.87 — ABNORMAL HIGH
Heparin Unfractionated: 1.04 — ABNORMAL HIGH

## 2010-12-30 LAB — URINALYSIS, ROUTINE W REFLEX MICROSCOPIC
Nitrite: NEGATIVE
Protein, ur: NEGATIVE
Specific Gravity, Urine: 1.026
Urobilinogen, UA: 1

## 2010-12-30 LAB — APTT
aPTT: 144 — ABNORMAL HIGH
aPTT: 28

## 2010-12-30 LAB — POCT CARDIAC MARKERS
CKMB, poc: 8.1
Operator id: 4761
Troponin i, poc: <0.05
Troponin i, poc: <0.05

## 2010-12-30 LAB — PROTIME-INR
Prothrombin Time: 13
Prothrombin Time: 13.3

## 2010-12-30 LAB — CARDIAC PANEL(CRET KIN+CKTOT+MB+TROPI)
CK, MB: 12.5 — ABNORMAL HIGH
Relative Index: 1
Total CK: 1089 — ABNORMAL HIGH
Troponin I: 0.08 — ABNORMAL HIGH

## 2010-12-30 LAB — CK TOTAL AND CKMB (NOT AT ARMC)
CK, MB: 14.9 — ABNORMAL HIGH
Total CK: 1605 — ABNORMAL HIGH

## 2010-12-30 LAB — LIPID PANEL
Cholesterol: 178
LDL Cholesterol: 110 — ABNORMAL HIGH

## 2010-12-30 LAB — TROPONIN I: Troponin I: 0.12 — ABNORMAL HIGH

## 2010-12-30 LAB — B-NATRIURETIC PEPTIDE (CONVERTED LAB): Pro B Natriuretic peptide (BNP): 38.3

## 2011-01-02 ENCOUNTER — Emergency Department (HOSPITAL_COMMUNITY): Payer: Managed Care, Other (non HMO)

## 2011-01-02 ENCOUNTER — Inpatient Hospital Stay (HOSPITAL_COMMUNITY)
Admission: EM | Admit: 2011-01-02 | Discharge: 2011-01-05 | DRG: 445 | Disposition: A | Discharge status: Home / Self Care | Payer: Managed Care, Other (non HMO) | Attending: Internal Medicine | Admitting: Internal Medicine

## 2011-01-02 DIAGNOSIS — M109 Gout, unspecified: Secondary | ICD-10-CM | POA: Diagnosis present

## 2011-01-02 DIAGNOSIS — Z86718 Personal history of other venous thrombosis and embolism: Secondary | ICD-10-CM

## 2011-01-02 DIAGNOSIS — I10 Essential (primary) hypertension: Secondary | ICD-10-CM | POA: Diagnosis present

## 2011-01-02 DIAGNOSIS — R112 Nausea with vomiting, unspecified: Secondary | ICD-10-CM | POA: Diagnosis present

## 2011-01-02 DIAGNOSIS — G4733 Obstructive sleep apnea (adult) (pediatric): Secondary | ICD-10-CM | POA: Diagnosis present

## 2011-01-02 DIAGNOSIS — K7689 Other specified diseases of liver: Secondary | ICD-10-CM | POA: Diagnosis present

## 2011-01-02 DIAGNOSIS — Z86711 Personal history of pulmonary embolism: Secondary | ICD-10-CM

## 2011-01-02 DIAGNOSIS — J45909 Unspecified asthma, uncomplicated: Secondary | ICD-10-CM | POA: Diagnosis present

## 2011-01-02 DIAGNOSIS — Z6841 Body Mass Index (BMI) 40.0 and over, adult: Secondary | ICD-10-CM

## 2011-01-02 DIAGNOSIS — K8020 Calculus of gallbladder without cholecystitis without obstruction: Principal | ICD-10-CM | POA: Diagnosis present

## 2011-01-02 DIAGNOSIS — E876 Hypokalemia: Secondary | ICD-10-CM | POA: Diagnosis present

## 2011-01-02 DIAGNOSIS — N289 Disorder of kidney and ureter, unspecified: Secondary | ICD-10-CM | POA: Diagnosis present

## 2011-01-02 LAB — COMPREHENSIVE METABOLIC PANEL
CO2: 31 mEq/L (ref 19–32)
Calcium: 9.6 mg/dL (ref 8.4–10.5)
Creatinine, Ser: 1.27 mg/dL (ref 0.50–1.35)
GFR calc Af Amer: 76 mL/min — ABNORMAL LOW (ref >90)
GFR calc non Af Amer: 65 mL/min — ABNORMAL LOW (ref >90)
Glucose, Bld: 118 mg/dL — ABNORMAL HIGH (ref 70–99)

## 2011-01-02 LAB — DIFFERENTIAL
Basophils Relative: 1 % (ref 0–1)
Eosinophils Absolute: 0 10*3/uL (ref 0.0–0.7)
Eosinophils Relative: 1 % (ref 0–5)
Lymphs Abs: 1.2 10*3/uL (ref 0.7–4.0)

## 2011-01-02 LAB — URINALYSIS, ROUTINE W REFLEX MICROSCOPIC
Glucose, UA: NEGATIVE mg/dL
Hgb urine dipstick: NEGATIVE
Ketones, ur: NEGATIVE mg/dL
Protein, ur: NEGATIVE mg/dL

## 2011-01-02 LAB — CBC
MCH: 26.2 pg (ref 26.0–34.0)
MCV: 84.2 fL (ref 78.0–100.0)
Platelets: 190 10*3/uL (ref 150–400)
RDW: 13.2 % (ref 11.5–15.5)
WBC: 3.3 10*3/uL — ABNORMAL LOW (ref 4.0–10.5)

## 2011-01-03 ENCOUNTER — Inpatient Hospital Stay (HOSPITAL_COMMUNITY): Payer: Managed Care, Other (non HMO)

## 2011-01-03 DIAGNOSIS — R1031 Right lower quadrant pain: Secondary | ICD-10-CM

## 2011-01-03 LAB — COMPREHENSIVE METABOLIC PANEL
ALT: 349 U/L — ABNORMAL HIGH (ref 0–53)
AST: 279 U/L — ABNORMAL HIGH (ref 0–37)
CO2: 38 mEq/L — ABNORMAL HIGH (ref 19–32)
Calcium: 9.4 mg/dL (ref 8.4–10.5)
Chloride: 97 mEq/L (ref 96–112)
GFR calc non Af Amer: 64 mL/min — ABNORMAL LOW (ref >90)
Sodium: 139 mEq/L (ref 135–145)
Total Bilirubin: 1.5 mg/dL — ABNORMAL HIGH (ref 0.3–1.2)

## 2011-01-03 LAB — CBC
Platelets: 176 10*3/uL (ref 150–400)
RBC: 5.04 MIL/uL (ref 4.22–5.81)
WBC: 2.9 10*3/uL — ABNORMAL LOW (ref 4.0–10.5)

## 2011-01-03 MED ORDER — SINCALIDE 5 MCG IJ SOLR: 0.0200 ug/kg | Freq: Once | INTRAMUSCULAR | Status: DC

## 2011-01-03 MED ORDER — TECHNETIUM TC 99M MEBROFENIN IV KIT
5.0000 | PACK | Freq: Once | INTRAVENOUS | Status: AC | PRN
  Administered 2011-01-03: 5 via INTRAVENOUS

## 2011-01-04 LAB — MAGNESIUM: Magnesium: 1.9 mg/dL (ref 1.5–2.5)

## 2011-01-04 LAB — COMPREHENSIVE METABOLIC PANEL
ALT: 324 U/L — ABNORMAL HIGH (ref 0–53)
BUN: 10 mg/dL (ref 6–23)
Calcium: 9.7 mg/dL (ref 8.4–10.5)
Creatinine, Ser: 1.16 mg/dL (ref 0.50–1.35)
GFR calc Af Amer: 84 mL/min — ABNORMAL LOW (ref >90)
Glucose, Bld: 93 mg/dL (ref 70–99)
Sodium: 139 mEq/L (ref 135–145)
Total Protein: 6.6 g/dL (ref 6.0–8.3)

## 2011-01-04 LAB — CBC
Hemoglobin: 14.1 g/dL (ref 13.0–17.0)
MCH: 27.1 pg (ref 26.0–34.0)
MCHC: 32 g/dL (ref 30.0–36.0)
MCV: 84.8 fL (ref 78.0–100.0)

## 2011-01-05 LAB — HEPATITIS PANEL, ACUTE
HCV Ab: NEGATIVE
Hep A IgM: NEGATIVE
Hepatitis B Surface Ag: NEGATIVE

## 2011-01-05 LAB — COMPREHENSIVE METABOLIC PANEL
Albumin: 3.4 g/dL — ABNORMAL LOW (ref 3.5–5.2)
BUN: 15 mg/dL (ref 6–23)
Calcium: 9.4 mg/dL (ref 8.4–10.5)
Creatinine, Ser: 1.54 mg/dL — ABNORMAL HIGH (ref 0.50–1.35)
GFR calc Af Amer: 60 mL/min — ABNORMAL LOW (ref >90)
Glucose, Bld: 90 mg/dL (ref 70–99)
Total Protein: 6.3 g/dL (ref 6.0–8.3)

## 2011-01-05 LAB — LIPASE, BLOOD: Lipase: 40 U/L (ref 11–59)

## 2011-01-05 LAB — AMYLASE: Amylase: 48 U/L (ref 0–105)

## 2011-01-05 NOTE — H&P (Signed)
NAMEANDER, Phillip [name]  MEDICAL RECORD NO.:  [id number]  LOCATION:  [facility]                         FACILITY:  [facility]  PHYSICIAN:  Derrill Kay, MD       DATE OF BIRTH:  14-Mar-1961  DATE OF ADMISSION:  01/02/2011 DATE OF DISCHARGE:                             HISTORY & PHYSICAL   CHIEF COMPLAINT:  Right upper quadrant abdominal pain.  HISTORY OF PRESENT ILLNESS:  [name] is a 50 year old male with a history of asthma, obesity, previous blood clots, who presents to the emergency department because of right upper quadrant abdominal pain that started suddenly this morning.  It has been sharp in nature.  He still has his gallbladder.  He had associated nausea and vomiting with it.  It started, went away, and then he ate and the pain got worse after he ate. His LFTs are elevated.  His alk phos is normal.  Total bili is normal. He denies any fevers.  His pain is much better now in the ED.  We are being asked to admit the patient as surgery-on-call did not deem this was a surgical patient.  The patient is much more comfortable now in the ED.  REVIEW OF SYSTEMS:  Otherwise negative.  PAST MEDICAL HISTORY: 1. Obstructive sleep apnea. 2. Nonsustained supraventricular tachycardia. 3. Morbid obesity. 4. Hypertension. 5. Remote history of pulmonary embolism and deep vein thrombosis, off     anticoagulation.  ALLERGIES:  None.  MEDICATIONS:  Amlodipine, clonidine, colchicine, HCTZ, potassium chloride.  SOCIAL HISTORY:  Denies smoking.  No alcohol.  No IV drug abuse.  He has no history of hepatitis.  PHYSICAL EXAMINATION:  VITAL SIGNS:  Temperature is 98.3; blood pressure initially is 192/106, now it is 159/81; pulse 62; respirations 20; 96% O2 sats on room air. GENERAL:  He is alert and oriented x4.  No apparent stress, cooperative, and friendly. HEENT:  Extraocular movements intact.  Pupils equal, reactive to light. Oropharynx clear.  Mucous  membrane moist. NECK:  No JVD.  No carotid bruits. COR:  Regular rate and rhythm without murmurs or gallops. CHEST:  Clear to auscultation bilaterally.  No wheezes, rhonchi or rales. ABDOMEN:  Soft.  It is tender to palpation in the right upper quadrant, nondistended, obese.  No rebound.  No guarding.  Positive bowel sounds. Nonacute abdomen. EXTREMITIES:  No clubbing, cyanosis, or edema. PSYCH:  Normal affect. NEURO:  No focal neurologic deficits. SKIN:  No rashes.  LABORATORY DATA:  Potassium 3.1, BUN and creatinine are normal.  AST is 553, ALT is 441, alk phos is 116, total bili is normal.  Lipase is normal.  Urinalysis is negative.  Cardiac enzymes negative.  White count 3.3, hemoglobin 13.4.  Abdominal ultrasound, cholelithiasis, poor quality due to his size.  ASSESSMENT/PLAN:  This is a 50 year old male with elevated liver function tests and biliary colic. 1. Biliary colic with possible acute cholecystitis with elevated liver     function tests.  Common bile duct stone was not visualized well on     the ultrasound due to his body habitus.  He might have passed the     stone; however,  his alk phos and total bilirubin is normal at this     time.  We will need to call surgery in the morning.  Dr. Harlow Asa,     who is on-call tonight has already been spoken to by the ED     physician; however, he deferred admission to [place], so I am not really     clear if he is aware that he needs to see the patient.  This will     need to be clarified in the morning as the patient probably would     need his gallbladder out at some point.  I will send off for acute     hepatitis panel. 2. Hypertension.  We will place him on some p.r.n. hydralazine also as     needed for systolic over 99991111 and diastolic over A999333. 3. History of venous thromboembolism in the past.  We will place him     on Lovenox prophylactically. 4. Morbid obesity, stable. 5. The patient is full code.  Further recommendation pending  over     hospital course.          ______________________________ Derrill Kay, MD     RD/MEDQ  D:  01/03/2011  T:  01/03/2011  Job:  [id number]  Electronically Signed by Derrill Kay MD on 01/05/2011 07:47:56 PM

## 2011-01-08 NOTE — Consult Note (Signed)
Phillip [name], Phillip [name] NO.:  [id number]  MEDICAL RECORD NO.:  [id number]  LOCATION:  98                         FACILITY:  [facility]  PHYSICIAN:  Orson Ape. Rise Patience, M.D.DATE OF BIRTH:  03-Oct-1960  DATE OF CONSULTATION:  01/03/2011 DATE OF DISCHARGE:                                CONSULTATION   PRIMARY CARE DOCTOR:  Dr. Selena Lesser Willey.  CARDIOLOGY:  Dr. Tollie Eth.  CHIEF COMPLAINT:  Right upper quadrant pain after eating.  BRIEF HISTORY:  The patient is a 50 year old black male who started having pain Saturday morning after eating eggs for breakfast.  It was in his right upper quadrant, lasted about 2 hours.  He had some nausea followed by vomiting and then felt better.  The 2nd episode was yesterday again after eating breakfast and taking his morning medications.  This time, it lasted about 6 hours.  He had nausea and vomiting again.  The pain was worse at this time, going to his back and his shoulders.  He actually went home from work and then rested for a while, but when he got up, he still had pain, came to the emergency room at [facility] for evaluation.  Currently, he is pain free.  PAST MEDICAL HISTORY: 1. Asthma. 2. Obesity, 69 inches and 181 kg down from 429 pounds.  BMI of 58.9. 3. Obstructive sleep apnea with CPAP. 4. Hypertension. 5. History of PE and DVT, August 31, 2008, currently off     anticoagulation. 6. Gout. 7. Hospitalized May 14, 2010 through May 16, 2010 with     nonsustained ventricular tachycardia and acute bronchitis.  A 2-D     echo done, May 15, 2010 was difficult to read because of its     size.  The EF was estimated at 55%.  PAST SURGICAL HISTORY:  Umbilical hernia repair in 1993.  FAMILY HISTORY:  Both parents are living.  His father had a heart transplant in 1999.  He is still living and doing well.  Mother is 42 and is on CPAP.  He has 3 brothers all in good health.  SOCIAL  HISTORY:  Tobacco never.  Alcohol none x20 years.  Drugs none. He is single.  He currently drives trucks.  He is a retired Agricultural consultant, on disability secondary to smoke inhalation.  REVIEW OF SYSTEMS:  Fever, none.  Weight down on diet.  CV:  Negative for headaches, dizziness, or syncope.  PULMONARY:  Negative.  He uses CPAP at night.  CARDIAC:  No chest pain or palpitations.  GI:  Negative for GERD.  Positive for nausea and vomiting x2 over the last week.  No diarrhea, constipation, or blood.  GU:  No trouble voiding.  LOWER EXTREMITIES:  No edema or claudication.  PSYCHIATRIC:  No changes. SKIN:  No changes.  CURRENT MEDICATIONS:  Norvasc, clonidine, colchicine, hydrochlorothiazide, and KCl.  ALLERGIES:  None.  PHYSICAL EXAMINATION:  GENERAL:  He is a well-nourished and well- developed African American male in no acute distress. VITAL SIGNS:  Temperature is 98.2, heart rate was 66, blood pressure was 186/97, respiratory rate is 12, and saturations are 100% on room air.  HEAD:  Normocephalic. EARS, NOSE, THROAT, AND MOUTH:  Within normal limits. NECK:  Trachea is in the midline.  There are no bruits.  No JVD. LUNGS:  Respiratory effort is normal.  Lungs are clear to auscultation and percussion. CARDIAC:  No murmur or rub was heard.  Normal S1 and S2.  Pulses are +2 and equal, both the upper and lower extremities. CHEST:  Nontender. ABDOMEN:  Bowel sounds are normal.  Abdomen is not distended.  He is not tender.  No hernia, masses, or abscess noted. GU/RECTAL:  Deferred. LYMPHATIC:  Lymphadenopathy, none palpated. MUSCULOSKELETAL:  He has trace edema in both lower extremities.  No other musculoskeletal changes. SKIN:  Normal.  No changes. NEUROLOGIC:  No focal deficits.  Cranial nerves II through XII are intact. PSYCHIATRIC:  Normal affect.  LABORATORY DATA:  Sodium is 139, potassium is 2.9, chloride is 97, CO2 is 38, BUN is 13, creatinine is 1.29, glucose is 99, total  bilirubin today is 1.5, alkaline phosphatase 114, SGOT 279, and SGPT is 349.  On admission, total bilirubin is 1.2, alkaline phosphatase 116, SGOT 553, and SGPT is 441.  White count is 2.9, hemoglobin 13.6, hematocrit 42, and platelets 176,000.  DIAGNOSTICS:  Ultrasound, October 2012, shows questionable gallstones at the neck.  No gallbladder wall thickening or pericholecystic fluid. Murphy sign was negative.  A HIDA scan was obtained, which showed a patent cystic duct and common bile duct.  Gallbladder EF was diminished at 15%.  He had no pain during the study.  ADDITIONAL LABORATORY DATA:  UA was negative.  Troponin was 0.03.  D- dimer was slightly elevated at 1.04.  IMPRESSION: 1. Questionable cholelithiasis with elevated LFTs.  HIDA scan showing     an ejection fraction of 15%. 2. Hypokalemia. 3. Mild renal insufficiency. 4. Leukopenia. 5. Hypertension. 6. History of supraventricular tachycardia, ejection fraction in     February was 55%. 7. History of pulmonary embolus and deep venous thrombosis. 8. Obstructive sleep apnea, on CPAP. 9. Gout. 10.History of asthma and bronchitis. 11.BMI of 58.9.  PLAN:  He has been seen and evaluated by Dr. Rise Patience.  He is currently completely asymptomatic.  We are going to recommend the hospitalist get him medically cleared for surgery.  We will keep him n.p.o., repeat his labs in the a.m., but he will need cardiac clearance prior to any surgery.  He will be seen in the a.m. by Dr. Hassell Done or Dr. Dalbert Batman.     Lydia Guiles, P.A.   ______________________________ Orson Ape. Rise Patience, M.D.    WDJ/MEDQ  D:  01/03/2011  T:  01/03/2011  Job:  [id number]  cc:   Ezzard Standing, M.D. [phone] Email: [email or web address]  Dr. Brynda Greathouse.  Electronically Signed by Jeanella Anton M.D. on 01/08/2011 08:55:40 AM

## 2011-01-24 NOTE — Discharge Summary (Signed)
NAMEMELBERN, CEFALO NO.:  [id number]  MEDICAL RECORD NO.:  [id number]  LOCATION:  [id number]                         FACILITY:  [facility]  PHYSICIAN:  Julieta Bellini, MDDATE OF BIRTH:  1960-11-27  DATE OF ADMISSION:  01/02/2011 DATE OF DISCHARGE:  01/05/2011                              DISCHARGE SUMMARY   DISCHARGE DIAGNOSES: 1. Presumed cholelithiasis with biliary colic unfortunately due to the     patient's body habitus.  HIDA scan and ultrasound of the abdomen     might not be accurate. 2. Transaminitis. 3. Hepatic steatosis. 4. Sleep apnea. 5. Morbid obesity. 6. History of supraventricular tachycardia, currently sinus rhythm. 7. History in the past of pulmonary embolism and deep venous     thrombosis.  At this point, off anticoagulation. 8. Gout. 9. Hypertension, which was uncontrolled.  DISCHARGE MEDICATIONS: 1. Clonidine 0.1 mg 1 tablet by mouth twice a day. 2. Losartan 50 mg 1 tablet by mouth daily. 3. Potassium chloride 40 mEq by mouth daily. 4. Albuterol 1 puff inhaled every 4 hours as needed for shortness of     breath. 5. Colchicine 0.6 mg 1 tablet by mouth daily as needed for gout. 6. HCTZ 25 mg 1 tablet by mouth daily. 7. Amlodipine 10 mg 1 tablet by mouth daily.  DISPOSITION AND FOLLOWUP:  The patient had been discharged in a stable and improved condition.  Currently, not complaining of any abdominal pain, nausea, vomiting, and able to tolerate p.o.  The patient had received instructions to follow a low-calorie and a low-fat diet and also keeping a heart healthy diet low in sodium.  He is going to arrange a followup appointment with primary care physician over the next 4-5 days.  It will be important to repeat at that time a comprehensive metabolic panel in order to look into the patient's LFTs to make sure that his transaminitis has now returned completely to normal, since it was already in the process of trending down.  Also important  to check on this lab work, the patient's creatinine and electrolytes, since his potassium was a little bit low most likely due to the use of diuretics. His creatinine was mildly elevated at discharge after he was started on losartan and his medications adjusted causing improvement in blood pressure control.  The patient will require further adjustment as an outpatient on his hypertension and also further followup and encouragement on his weight loss process.  PROCEDURE PERFORMED DURING THIS HOSPITALIZATION:  The patient has ultrasound of the abdomen that demonstrated questionable gallstones in a very limited examination due to the patient's body habitus without evidence of cholecystitis.  The common bile duct measures up to 1 cm proximally, however, it is now visualized within the mid and distal portions.  Hepatic steatosis, no overt hydronephrosis.  The patient also had a HIDA scan that demonstrated patency of the cystic and common bile ducts with a low gallbladder ejection fraction.  No other procedures were performed during this hospitalization.  Surgery was consulted initially.  Please refer to the consultation note for further details, and also cardiology was consulted for cardiac clearance in case that the patient require cholecystectomy, specifically,  Dr. Terrence Dupont.  HISTORY OF PRESENT ILLNESS:  The patient is a 50 year old male with a history of seasonal asthma, obesity, previous blood clots, who presents to the emergency department because of right upper quadrant abdominal pain that started suddenly on the morning of admission.  The patient described the pain to be sharp in nature and was associated with nausea and vomiting when the pain started.  The pain came after he ate fatty foods.  In the ED, the patient had elevated LFTs.  Alk phos was normal and his bilirubin was initially normal.  PERTINENT LABORATORY DATA:  Includes a CBC with differential showing a white blood cell  of 4.0, hemoglobin 13.4, platelets 190.  Troponins 0.03.  Urinalysis, negative.  Lipase 43.  Comprehensive metabolic panel demonstrated a sodium of 138, potassium 3.1, chloride 97, bicarb 31, glucose 118, BUN 13, creatinine 1.27, bilirubin 1.2, alk phos 116, AST 553, ALT 441, total protein 7.2, albumin 3.7, calcium 9.6, magnesium was 1.9.  Complete hepatitis panel was negative.  Amylase 48.  At discharge, comprehensive metabolic panel demonstrated a sodium of 139, potassium 3.3, but with orders to receive 40 mEq of potassium prior to going home, chloride 98, bicarb 37, glucose 90, BUN 15, creatinine 1.3, bilirubin 0.8, alk phos 121, AST 83, ALT 220, total protein 6.3, albumin 3.4, calcium 9.4.  HOSPITAL COURSE BY PROBLEM: 1. Abdominal pain with associated nausea and vomiting after the     patient ate fatty food.  Most likely per son, cholelithiasis with a     biliary colic at this point.  After discussing the case with     surgery and reviewing a HIDA scan and also ultrasound findings plus     the fact that after the patient stayed 1 day on clear liquid diet,     he was not having any further pain in his liver function and     started trending down appropriately.  The decision was not to     operate on this patient and just to follow his symptoms clinically.     The patient have significant high risk for surgical procedure at     this point due to his morbid obesity.  The patient will follow a     low-fat diet, and he will follow with primary care physician in     order to assess complete resolution of his symptoms. 2. Transaminitis at this point might be secondary to the biliary colic     that he had plus also due to hepatic steatosis found on his     ultrasound.  At discharge, levels were coming down and the patient     was not having any further symptoms.  Hepatitis panel was negative.     He will follow with primary care physician in order to repeat this     lab over the next 5  days. 3. Hepatic steatosis.  The patient advised to follow a low-fat diet.     He is not using any statins at this point.  The patient will need     further followup of this problem as an outpatient. 4. Sleep apnea.  He will continue using CPAP. 5. Morbid obesity.  The patient has been advised to follow a low-     calorie diet and also to increase exercise.  He will continue     following with primary care physician for further assistance and     help plus encouragement on his weight loss process. 6. History  of supraventricular tachycardia in the past.  The patient     is currently without any abnormal rate or rhythm and completely     normal cardiac enzymes.  He has been advised to follow a heart     healthy diet. 7. History of hypertension.  After the patient was evaluated by     Cardiology, recommendation was to start the patient on losartan 50     mg by mouth daily, and also to increase his clonidine to twice a     day.  The patient will continue taking the rest of his medications     as previously prescribed.  I will follow a low-sodium diet.  Once     he was kept on this medication for 2 days prior to discharge, his     vital signs demonstrated significant improvement and his blood     pressure controlled, but his creatinine went up a little bit most     likely due to the initiation of these medications and mild     decreased perfusion with a better control blood pressure.  He is     going to follow with primary care physician for further adjustment     on the dose of his antihypertensive drugs and to follow on the     elevation of the creatinine level. 8. History of PE and DVT in the past.  At this point, not having any     lower extremity pain.  No chest pain.  No shortness of breath.  He     is off anticoagulation. 9. Gout.  No acute flares.  He will continue using p.r.n. colchicine. 10.Elevated creatinine, after initiation of ARBs.  Most likely due to     the medication plus  the decreased perfusion, now that the blood     pressure is better controlled.  The patient has been advised to     keep himself hydrated and to follow with primary care physician in     order to repeat labs as an outpatient over the next 5 days and     adjust the doses as needed/discontinuation of the medication if     required.  PHYSICAL EXAMINATION:  VITAL SIGNS:  At discharge, temperature 97.7, respiratory system demonstrated a respiratory rate of 19, heart rate 67, blood pressure 137/81, oxygen saturation 96% on room air. GENERAL:  The patient was in no acute distress.  No nausea.  No vomiting.  No abdominal pain. RESPIRATORY SYSTEM:  Clear to auscultation bilaterally. HEART:  Regular rate and rhythm.  No murmurs, gallops, or rubs. ABDOMEN:  Soft, obese.  Positive bowel sounds.  No tenderness. EXTREMITIES:  No cyanosis.  Trace of edema was appreciated bilaterally. NEUROLOGIC EXAM:  Nonfocal.     Julieta Bellini, MD     CEM/MEDQ  D:  01/05/2011  T:  01/05/2011  Job:  [id number]  cc:   Dr. Randol Kern  Electronically Signed by Barton Dubois MD on 01/24/2011 10:29:53 PM

## 2011-03-11 ENCOUNTER — Encounter: Payer: Self-pay | Admitting: *Deleted

## 2011-03-11 ENCOUNTER — Emergency Department (HOSPITAL_COMMUNITY)
Admission: EM | Admit: 2011-03-11 | Discharge: 2011-03-12 | Disposition: A | Discharge status: Home / Self Care | Payer: Managed Care, Other (non HMO) | Attending: Emergency Medicine | Admitting: Emergency Medicine

## 2011-03-11 DIAGNOSIS — I10 Essential (primary) hypertension: Secondary | ICD-10-CM | POA: Insufficient documentation

## 2011-03-11 DIAGNOSIS — R358 Other polyuria: Secondary | ICD-10-CM | POA: Insufficient documentation

## 2011-03-11 DIAGNOSIS — M549 Dorsalgia, unspecified: Secondary | ICD-10-CM

## 2011-03-11 DIAGNOSIS — R61 Generalized hyperhidrosis: Secondary | ICD-10-CM | POA: Insufficient documentation

## 2011-03-11 DIAGNOSIS — R3589 Other polyuria: Secondary | ICD-10-CM | POA: Insufficient documentation

## 2011-03-11 DIAGNOSIS — R109 Unspecified abdominal pain: Secondary | ICD-10-CM | POA: Insufficient documentation

## 2011-03-11 DIAGNOSIS — Z79899 Other long term (current) drug therapy: Secondary | ICD-10-CM | POA: Insufficient documentation

## 2011-03-11 DIAGNOSIS — R631 Polydipsia: Secondary | ICD-10-CM | POA: Insufficient documentation

## 2011-03-11 HISTORY — DX: Essential (primary) hypertension: I10

## 2011-03-11 NOTE — ED Notes (Addendum)
Pt in c/o left flank pain x 2-3 hours, pain is increased with movement, denies other symptoms

## 2011-03-12 LAB — DIFFERENTIAL
Eosinophils Absolute: 0 10*3/uL (ref 0.0–0.7)
Lymphocytes Relative: 39 % (ref 12–46)
Lymphs Abs: 2.2 10*3/uL (ref 0.7–4.0)
Monocytes Relative: 9 % (ref 3–12)
Neutrophils Relative %: 51 % (ref 43–77)

## 2011-03-12 LAB — BASIC METABOLIC PANEL
BUN: 18 mg/dL (ref 6–23)
CO2: 36 mEq/L — ABNORMAL HIGH (ref 19–32)
GFR calc non Af Amer: 49 mL/min — ABNORMAL LOW (ref >90)
Glucose, Bld: 99 mg/dL (ref 70–99)
Potassium: 3.3 mEq/L — ABNORMAL LOW (ref 3.5–5.1)

## 2011-03-12 LAB — CBC
Hemoglobin: 14.2 g/dL (ref 13.0–17.0)
MCH: 27.4 pg (ref 26.0–34.0)
MCV: 84.8 fL (ref 78.0–100.0)
RBC: 5.19 MIL/uL (ref 4.22–5.81)

## 2011-03-12 LAB — URINALYSIS, ROUTINE W REFLEX MICROSCOPIC
Glucose, UA: NEGATIVE mg/dL
Hgb urine dipstick: NEGATIVE
Specific Gravity, Urine: 1.024 (ref 1.005–1.030)
Urobilinogen, UA: 1 mg/dL (ref 0.0–1.0)
pH: 6 (ref 5.0–8.0)

## 2011-03-12 MED ORDER — HYDROCODONE-ACETAMINOPHEN 5-500 MG PO TABS: 1.0000 | ORAL_TABLET | Freq: Four times a day (QID) | ORAL | Status: AC | PRN

## 2011-03-12 MED ORDER — CYCLOBENZAPRINE HCL 10 MG PO TABS: 10.0000 mg | ORAL_TABLET | Freq: Two times a day (BID) | ORAL | Status: AC | PRN

## 2011-03-12 NOTE — ED Notes (Signed)
Patient stable upon discharge.  

## 2011-03-12 NOTE — ED Notes (Signed)
Ask patient again if he could give a urine sample and he stated not at this time

## 2011-03-12 NOTE — ED Provider Notes (Signed)
History     CSN: [id number] Arrival date & time: 03/11/2011  8:26 PM   First MD Initiated Contact with Patient 03/11/11 2351      Chief Complaint  Patient presents with  . Flank Pain    (Consider location/radiation/quality/duration/timing/severity/associated sxs/prior treatment) HPI Comments: Patient was in the shower tonight when he developed abrupt onset of left flank pain that was 10 out of 10. Denies any nausea or vomiting but states it didn't make him breakout in a sweat. He states he's never had pain like this before and in the last 30 minutes to an hour the pain is started to improve on its own and now is 3/10.  Patient is a 50 y.o. male presenting with flank pain. The history is provided by the patient.  Flank Pain This is a new problem. The current episode started 1 to 2 hours ago. The problem occurs constantly. The problem has been gradually improving. Pertinent negatives include no chest pain, no abdominal pain, no headaches and no shortness of breath. The symptoms are aggravated by twisting (Certain positions). The symptoms are relieved by nothing. He has tried nothing for the symptoms. The treatment provided significant relief.    Past Medical History  Diagnosis Date  . Hypertension     History reviewed. No pertinent past surgical history.  History reviewed. No pertinent family history.  History  Substance Use Topics  . Smoking status: Not on file  . Smokeless tobacco: Not on file  . Alcohol Use:       Review of Systems  Constitutional: Positive for diaphoresis. Negative for chills.  Respiratory: Negative for shortness of breath.   Cardiovascular: Negative for chest pain.  Gastrointestinal: Negative for abdominal pain.  Genitourinary: Positive for flank pain. Negative for dysuria and difficulty urinating.       Polyuria and polydipsia  Neurological: Negative for headaches.  All other systems reviewed and are negative.    Allergies  Review of patient's  allergies indicates no known allergies.  Home Medications   Current Outpatient Rx  Name Route Sig Dispense Refill  . ALBUTEROL SULFATE HFA 108 (90 BASE) MCG/ACT IN AERS Inhalation Inhale 2 puffs into the lungs every 6 (six) hours as needed.      [name] AMLODIPINE BESYLATE 10 MG PO TABS Oral Take 10 mg by mouth daily.      [name] CLONIDINE HCL 0.1 MG PO TABS Oral Take 0.1 mg by mouth daily.      [name] HYDROCHLOROTHIAZIDE 25 MG PO TABS Oral Take 25 mg by mouth daily.      [name] POTASSIUM CHLORIDE CRYS CR 20 MEQ PO TBCR Oral Take 20 mEq by mouth daily.        BP 209/105  Pulse 95  Temp(Src) 98.7 F (37.1 C) (Oral)  Resp 20  SpO2 96%  Physical Exam  Nursing note and vitals reviewed. Constitutional: He is oriented to person, place, and time. He appears well-developed and well-nourished. No distress.  HENT:  Head: Normocephalic and atraumatic.  Mouth/Throat: Oropharynx is clear and moist.  Eyes: Conjunctivae and EOM are normal. Pupils are equal, round, and reactive to light.  Neck: Normal range of motion. Neck supple.  Cardiovascular: Normal rate, regular rhythm and intact distal pulses.   No murmur heard. Pulmonary/Chest: Effort normal and breath sounds normal. No respiratory distress. He has no wheezes. He has no rales.  Abdominal: Soft. He exhibits no distension. There is no tenderness. There is no rebound, no guarding and no CVA tenderness.  Musculoskeletal:  Normal range of motion. He exhibits no edema and no tenderness.       Unable to reproduce the pain by palpation however when the patient twisted to the right states his pain is worse.  Neurological: He is alert and oriented to person, place, and time.  Skin: Skin is warm and dry. No rash noted. No erythema.  Psychiatric: He has a normal mood and affect. His behavior is normal.    ED Course  Procedures (including critical care time)  Labs Reviewed  BASIC METABOLIC PANEL - Abnormal; Notable for the following:    Potassium 3.3 (*)     Chloride 95 (*)    CO2 36 (*)    Creatinine, Ser 1.62 (*)    GFR calc non Af Amer 49 (*)    GFR calc Af Amer 56 (*)    All other components within normal limits  URINALYSIS, ROUTINE W REFLEX MICROSCOPIC - Abnormal; Notable for the following:    Bilirubin Urine SMALL (*)    Ketones, ur TRACE (*)    All other components within normal limits  CBC  DIFFERENTIAL   No results found.   No diagnosis found.    MDM  Patient with left-sided back pain that started abruptly today while he was in the shower. Patient denies bending or twisting a certain way right before the pain started he said it was very sharp and intense and did not radiate. He denies any history of kidney stones and states that currently his left flank pain is much improved. It is now only a 3/10. He denies any change in his urine or dysuria. No fevers, and no neurologic symptoms. But states that recently he has had polydipsia. Patient is a morbidly obese man with no diagnosis of diabetes. He has a history of hypertension and today is 209/105. He denies any chest pain, shortness of breath, headache. Will check CBC, BMP, UA eval for blood in the urine and for signs of kidney stone or diabetes.  3:16 AM Labs within normal limits other than a creatinine that was 1.6 without any old to compare. Repeat blood pressure was 130/70. Most likely musculoskeletal pain. Will get supportive care. We'll have him followup with his doctor for recheck of his creatinine.      Blanchie Dessert, MD 03/12/11 [phone]

## 2011-03-12 NOTE — ED Notes (Signed)
Patient states that he is unable to use the restroom at this time

## 2011-08-27 ENCOUNTER — Emergency Department (HOSPITAL_COMMUNITY): Payer: Managed Care, Other (non HMO)

## 2011-08-27 ENCOUNTER — Emergency Department (HOSPITAL_COMMUNITY)
Admission: EM | Admit: 2011-08-27 | Discharge: 2011-08-27 | Disposition: A | Discharge status: Home / Self Care | Payer: Managed Care, Other (non HMO) | Attending: Emergency Medicine | Admitting: Emergency Medicine

## 2011-08-27 ENCOUNTER — Encounter (HOSPITAL_COMMUNITY): Payer: Self-pay | Admitting: *Deleted

## 2011-08-27 DIAGNOSIS — I10 Essential (primary) hypertension: Secondary | ICD-10-CM | POA: Insufficient documentation

## 2011-08-27 DIAGNOSIS — Z79899 Other long term (current) drug therapy: Secondary | ICD-10-CM | POA: Insufficient documentation

## 2011-08-27 DIAGNOSIS — B351 Tinea unguium: Secondary | ICD-10-CM | POA: Insufficient documentation

## 2011-08-27 DIAGNOSIS — M79673 Pain in unspecified foot: Secondary | ICD-10-CM

## 2011-08-27 DIAGNOSIS — M109 Gout, unspecified: Secondary | ICD-10-CM | POA: Insufficient documentation

## 2011-08-27 DIAGNOSIS — M79609 Pain in unspecified limb: Secondary | ICD-10-CM | POA: Insufficient documentation

## 2011-08-27 HISTORY — DX: Gout, unspecified: M10.9

## 2011-08-27 MED ORDER — IBUPROFEN 600 MG PO TABS: 600.0000 mg | ORAL_TABLET | Freq: Four times a day (QID) | ORAL | Status: AC | PRN

## 2011-08-27 MED ORDER — TERBINAFINE HCL 250 MG PO TABS: 250.0000 mg | ORAL_TABLET | Freq: Every day | ORAL | Status: DC

## 2011-08-27 NOTE — ED Notes (Signed)
Pt here from home with c/o pain on left foot radiating up left leg. Pain began 3 days ago. Pt describes pain as "pins and needles" and states that leg is sometimes numb. Hx of blood clots in 2009 -  treated at [facility], per pt.

## 2011-08-27 NOTE — ED Provider Notes (Signed)
History     CSN: [id number]  Arrival date & time 08/27/11  1047   First MD Initiated Contact with Patient 08/27/11 1248      Chief Complaint  Patient presents with  . Leg Pain    (Consider location/radiation/quality/duration/timing/severity/associated sxs/prior treatment) HPI Comments: Patient is [name] with a history of pulmonary embolism in 2009 and hypertension that presents emergency department with a chief complaint of left ankle pain.  Onset of symptoms began 3 days ago and has gradually worsened until today.  Pain is worsened with weightbearing and movement.  Pain radiates up to the left lateral thigh. Pt has not taken anything to relieve pain. Patient denies any shortness of breath, chest pain, calf pain, claudication, recent trauma, syncope, or palpitations. IN addition pt requests tx for onychomycosis.   Patient is a 51 y.o. male presenting with leg pain. The history is provided by the patient.  Leg Pain     Past Medical History  Diagnosis Date  . Hypertension   . Gout     Past Surgical History  Procedure Date  . Hernia repair     No family history on file.  History  Substance Use Topics  . Smoking status: Never Smoker   . Smokeless tobacco: Not on file  . Alcohol Use: No      Review of Systems  All other systems reviewed and are negative.    Allergies  Review of patient's allergies indicates no known allergies.  Home Medications   Current Outpatient Rx  Name Route Sig Dispense Refill  . AMLODIPINE BESYLATE 10 MG PO TABS Oral Take 10 mg by mouth daily.      [name] CLONIDINE HCL 0.1 MG PO TABS Oral Take 0.1 mg by mouth daily.      [name] HYDROCHLOROTHIAZIDE 25 MG PO TABS Oral Take 25 mg by mouth daily.      [name] POTASSIUM CHLORIDE CRYS ER 20 MEQ PO TBCR Oral Take 20 mEq by mouth daily.      . ALBUTEROL SULFATE HFA 108 (90 BASE) MCG/ACT IN AERS Inhalation Inhale 2 puffs into the lungs every 6 (six) hours as needed. For shortness of breath      BP  135/71  Pulse 70  Temp(Src) 98.1 F (36.7 C) (Oral)  Resp 14  Ht 5\' 10"  (1.778 m)  Wt 406 lb (184.16 kg)  BMI 58.25 kg/m2  SpO2 98%  Physical Exam  Nursing note and vitals reviewed. Constitutional: He is oriented to person, place, and time. He appears well-developed and well-nourished. No distress.       Morbidly obese, NAD  HENT:  Head: Normocephalic and atraumatic.  Eyes: Conjunctivae and EOM are normal.  Neck: Normal range of motion.  Cardiovascular:       RRR, intact distal pulses, no pitting edema  Pulmonary/Chest: Effort normal.       LCAB  Musculoskeletal: Normal range of motion.       No ttp of calf, thigh, or bony prominence of foot or ankle.   Neurological: He is alert and oriented to person, place, and time.  Skin: Skin is warm and dry. No rash noted. He is not diaphoretic.       No erythema, warmth, or extremity swelling   Psychiatric: He has a normal mood and affect. His behavior is normal.    ED Course  Procedures (including critical care time)  Labs Reviewed - No data to display Dg Ankle Complete Left  08/27/2011  *RADIOLOGY REPORT*  Clinical Data: Anterior medial ankle pain for 3 days.  No known injury.  LEFT ANKLE COMPLETE - 3+ VIEW  Comparison: None.  Findings: The mineralization and alignment are normal.  There is no evidence of acute fracture or dislocation.  There is medial malleolar spurring and a small plantar calcaneal spur.  The soft tissues surrounding the ankle appear diffusely prominent without apparent focal swelling.  IMPRESSION: No acute osseous findings or focal swelling identified.  Original Report Authenticated By: Vivia Ewing, M.D.     No diagnosis found.    MDM  Onchomycosis, foot pain  Exam non concerning for PE or DVT. Radiology reviewed. Pt dc with antiinflammatory medication and advised to f-u w pcp. Return precautions for PE and DVT discussed. Pt verbalizes understanding. Pt hemodynamically stable and in NAD prior to dc.          Verl Dicker, [place] 08/27/11 1355

## 2011-08-27 NOTE — Discharge Instructions (Signed)
SEEK IMMEDIATE MEDICAL CARE IF:  You have chest pain.  You have trouble breathing.  You have new or increased swelling or pain in one leg.  You cough up blood.  You notice blood in vomit, in a bowel movement, or in urine.  You have an oral temperature above 102 F (38.9 C), not controlled by medicine.

## 2011-08-27 NOTE — ED Notes (Signed)
Pt returned from xray

## 2011-08-30 NOTE — ED Provider Notes (Signed)
  I performed a history and physical examination of Phillip [name] and discussed his management with Ms. Larose Kells  I agree with the history, physical, assessment, and plan of care, with the following exceptions: None  I was present for the following procedures: None Time Spent in Critical Care of the patient: None Time spent in discussions with the patient and family:5 Rowyn Spilde Shelda Jakes, MD 08/30/11 [phone]

## 2012-01-07 ENCOUNTER — Emergency Department (HOSPITAL_COMMUNITY): Payer: Managed Care, Other (non HMO)

## 2012-01-07 ENCOUNTER — Encounter (HOSPITAL_COMMUNITY): Payer: Self-pay | Admitting: *Deleted

## 2012-01-07 ENCOUNTER — Emergency Department (HOSPITAL_COMMUNITY)
Admission: EM | Admit: 2012-01-07 | Discharge: 2012-01-07 | Disposition: A | Discharge status: Home / Self Care | Payer: Managed Care, Other (non HMO) | Attending: Emergency Medicine | Admitting: Emergency Medicine

## 2012-01-07 DIAGNOSIS — R109 Unspecified abdominal pain: Secondary | ICD-10-CM | POA: Insufficient documentation

## 2012-01-07 DIAGNOSIS — R10814 Left lower quadrant abdominal tenderness: Secondary | ICD-10-CM | POA: Insufficient documentation

## 2012-01-07 DIAGNOSIS — R944 Abnormal results of kidney function studies: Secondary | ICD-10-CM | POA: Insufficient documentation

## 2012-01-07 DIAGNOSIS — E876 Hypokalemia: Secondary | ICD-10-CM | POA: Insufficient documentation

## 2012-01-07 DIAGNOSIS — R110 Nausea: Secondary | ICD-10-CM | POA: Insufficient documentation

## 2012-01-07 DIAGNOSIS — Z79899 Other long term (current) drug therapy: Secondary | ICD-10-CM | POA: Insufficient documentation

## 2012-01-07 DIAGNOSIS — I10 Essential (primary) hypertension: Secondary | ICD-10-CM | POA: Insufficient documentation

## 2012-01-07 DIAGNOSIS — I491 Atrial premature depolarization: Secondary | ICD-10-CM | POA: Insufficient documentation

## 2012-01-07 LAB — COMPREHENSIVE METABOLIC PANEL
BUN: 17 mg/dL (ref 6–23)
Calcium: 9.4 mg/dL (ref 8.4–10.5)
Creatinine, Ser: 1.36 mg/dL — ABNORMAL HIGH (ref 0.50–1.35)
GFR calc Af Amer: 69 mL/min — ABNORMAL LOW (ref >90)
GFR calc non Af Amer: 60 mL/min — ABNORMAL LOW (ref >90)
Glucose, Bld: 106 mg/dL — ABNORMAL HIGH (ref 70–99)
Sodium: 141 mEq/L (ref 135–145)
Total Protein: 7 g/dL (ref 6.0–8.3)

## 2012-01-07 LAB — URINALYSIS, ROUTINE W REFLEX MICROSCOPIC
Nitrite: NEGATIVE
Protein, ur: NEGATIVE mg/dL
Specific Gravity, Urine: 1.024 (ref 1.005–1.030)
Urobilinogen, UA: 1 mg/dL (ref 0.0–1.0)

## 2012-01-07 LAB — LIPASE, BLOOD: Lipase: 39 U/L (ref 11–59)

## 2012-01-07 LAB — CBC WITH DIFFERENTIAL/PLATELET
Basophils Relative: 0 % (ref 0–1)
Eosinophils Absolute: 0 10*3/uL (ref 0.0–0.7)
Hemoglobin: 13.8 g/dL (ref 13.0–17.0)
Lymphs Abs: 1.9 10*3/uL (ref 0.7–4.0)
Monocytes Relative: 8 % (ref 3–12)
Neutro Abs: 2.3 10*3/uL (ref 1.7–7.7)
Neutrophils Relative %: 50 % (ref 43–77)
Platelets: 199 10*3/uL (ref 150–400)
RBC: 5.06 MIL/uL (ref 4.22–5.81)

## 2012-01-07 MED ORDER — SODIUM CHLORIDE 0.9 % IV SOLN
Freq: Once | INTRAVENOUS | Status: AC
  Administered 2012-01-07: 06:00:00 via INTRAVENOUS

## 2012-01-07 MED ORDER — PROMETHAZINE HCL 25 MG PO TABS: 25.0000 mg | ORAL_TABLET | Freq: Four times a day (QID) | ORAL | Status: DC | PRN

## 2012-01-07 MED ORDER — POTASSIUM CHLORIDE CRYS ER 20 MEQ PO TBCR
40.0000 meq | EXTENDED_RELEASE_TABLET | Freq: Once | ORAL | Status: AC
  Administered 2012-01-07: 40 meq via ORAL
  Filled 2012-01-07: qty 2

## 2012-01-07 MED ORDER — ONDANSETRON HCL 4 MG/2ML IJ SOLN
4.0000 mg | Freq: Once | INTRAMUSCULAR | Status: AC
  Administered 2012-01-07: 4 mg via INTRAVENOUS
  Filled 2012-01-07: qty 2

## 2012-01-07 NOTE — ED Notes (Signed)
Pt c/o left lower abd pain; woke him up this morning; nausea; last bm this morning

## 2012-01-07 NOTE — ED Provider Notes (Signed)
History     CSN: [id number]  Arrival date & time 01/07/12  [id number]   First MD Initiated Contact with Patient 01/07/12 [phone]      Chief Complaint  Patient presents with  . Abdominal Pain    (Consider location/radiation/quality/duration/timing/severity/associated sxs/prior treatment) HPI Comments: Phillip [name] 51 y.o. male   The chief complaint is: Patient presents with:   Abdominal Pain    51 year old male presents today with chief complaint of left lower quadrant abdominal pain. Patient states that he was awoken this morning with left lower quadrant abdominal pain. He states the pain "feels like its gas." Patient make daily bm - last one yesterday morning. +mild nausea. No vomiting, diarrhea. Pain is rated at 5/10. Does not radiate.  Nothing tried. No aggravating/alleviating factors.  Pain is intermittent. Denies fevers, chills, myalgias, arthralgias. Denies DOE, SOB, chest tightness or pressure, radiation to left arm, jaw or back, or diaphoresis. Denies dysuria, flank pain, suprapubic pain, frequency, urgency, or hematuria. Denies headaches, light headedness, weakness, visual disturbances.      The history is provided by the patient and medical records. No language interpreter was used.    Past Medical History  Diagnosis Date  . Hypertension   . Gout     Past Surgical History  Procedure Date  . Hernia repair     No family history on file.  History  Substance Use Topics  . Smoking status: Never Smoker   . Smokeless tobacco: Not on file  . Alcohol Use: No      Review of Systems  Constitutional: Negative for fever and chills.  Respiratory: Negative for cough and shortness of breath.   Cardiovascular: Negative for chest pain and palpitations.  Gastrointestinal: Positive for nausea and abdominal pain. Negative for vomiting, diarrhea and constipation.  Genitourinary: Negative for dysuria, urgency and frequency.  Musculoskeletal: Negative for myalgias and  arthralgias.  Skin: Negative for rash.  Neurological: Negative for headaches.    Allergies  Review of patient's allergies indicates no known allergies.  Home Medications   Current Outpatient Rx  Name Route Sig Dispense Refill  . ALBUTEROL SULFATE HFA 108 (90 BASE) MCG/ACT IN AERS Inhalation Inhale 2 puffs into the lungs every 6 (six) hours as needed. For shortness of breath    . AMLODIPINE BESYLATE 10 MG PO TABS Oral Take 10 mg by mouth daily.      [name] CLONIDINE HCL 0.1 MG PO TABS Oral Take 0.1 mg by mouth daily.      [name] HYDROCHLOROTHIAZIDE 25 MG PO TABS Oral Take 25 mg by mouth daily.      [name] POTASSIUM CHLORIDE CRYS ER 20 MEQ PO TBCR Oral Take 20 mEq by mouth daily.        BP 133/73  Pulse 64  Temp 98.5 F (36.9 C)  Resp 20  SpO2 100%  Physical Exam  Nursing note and vitals reviewed. Constitutional: He is oriented to person, place, and time. He appears well-developed and well-nourished. No distress.       Pleasant, morbidly obese male in NAD  HENT:  Head: Normocephalic and atraumatic.  Eyes: Conjunctivae normal are normal. No scleral icterus.  Neck: Normal range of motion. Neck supple.  Cardiovascular: Normal rate, normal heart sounds and intact distal pulses.        Regularly Irregular rhythm  Pulmonary/Chest: Effort normal and breath sounds normal. No respiratory distress.  Abdominal: Soft. Bowel sounds are normal. He exhibits no distension and no mass. There is tenderness (mild tenderness to deep  palpation of left lower quadrant). There is no rebound and no guarding.  Musculoskeletal: He exhibits no edema.  Neurological: He is alert and oriented to person, place, and time.  Skin: Skin is warm and dry. He is not diaphoretic.  Psychiatric: His behavior is normal.    ED Course  Procedures (including critical care time)  Results for orders placed during the hospital encounter of 01/07/12  CBC WITH DIFFERENTIAL      Component Value Range   WBC 4.6  4.0 - 10.5 K/uL   RBC  5.06  4.22 - 5.81 MIL/uL   Hemoglobin 13.8  13.0 - 17.0 g/dL   HCT 42.8  39.0 - 52.0 %   MCV 84.6  78.0 - 100.0 fL   MCH 27.3  26.0 - 34.0 pg   MCHC 32.2  30.0 - 36.0 g/dL   RDW 13.5  11.5 - 15.5 %   Platelets 199  150 - 400 K/uL   Neutrophils Relative 50  43 - 77 %   Neutro Abs 2.3  1.7 - 7.7 K/uL   Lymphocytes Relative 41  12 - 46 %   Lymphs Abs 1.9  0.7 - 4.0 K/uL   Monocytes Relative 8  3 - 12 %   Monocytes Absolute 0.4  0.1 - 1.0 K/uL   Eosinophils Relative 1  0 - 5 %   Eosinophils Absolute 0.0  0.0 - 0.7 K/uL   Basophils Relative 0  0 - 1 %   Basophils Absolute 0.0  0.0 - 0.1 K/uL  COMPREHENSIVE METABOLIC PANEL      Component Value Range   Sodium 141  135 - 145 mEq/L   Potassium 3.1 (*) 3.5 - 5.1 mEq/L   Chloride 99  96 - 112 mEq/L   CO2 33 (*) 19 - 32 mEq/L   Glucose, Bld 106 (*) 70 - 99 mg/dL   BUN 17  6 - 23 mg/dL   Creatinine, Ser 1.36 (*) 0.50 - 1.35 mg/dL   Calcium 9.4  8.4 - 10.5 mg/dL   Total Protein 7.0  6.0 - 8.3 g/dL   Albumin 3.6  3.5 - 5.2 g/dL   AST 19  0 - 37 U/L   ALT 16  0 - 53 U/L   Alkaline Phosphatase 66  39 - 117 U/L   Total Bilirubin 0.4  0.3 - 1.2 mg/dL   GFR calc non Af Amer 60 (*) >90 mL/min   GFR calc Af Amer 69 (*) >90 mL/min  LIPASE, BLOOD      Component Value Range   Lipase 39  11 - 59 U/L     Ct Abdomen Pelvis Wo Contrast  01/07/2012  *RADIOLOGY REPORT*  Clinical Data: Abdominal pain.  Left-sided flank pain.  CT ABDOMEN AND PELVIS WITHOUT CONTRAST  Technique:  Multidetector CT imaging of the abdomen and pelvis was performed following the standard protocol without intravenous contrast.  Comparison: No priors.  Findings:  Lung Bases: Unremarkable.  Abdomen/Pelvis:  There are no abnormal calcifications within the collecting system of either kidney, along the course of either ureter, or within the lumen of the urinary bladder.  Additionally, there is no hydroureteronephrosis or perinephric stranding to suggest significant urinary tract  obstruction at this time.  The unenhanced appearance of the liver, gallbladder, pancreas, spleen, bilateral adrenal glands and bilateral kidneys is unremarkable.  There is no ascites or pneumoperitoneum and no pathologic distension of bowel.  No definite pathologic lymphadenopathy identified within the abdomen or pelvis on this noncontrast  CT examination.  The appendix is normal.  Urinary bladder is unremarkable in appearance.  Musculoskeletal: There are no aggressive appearing lytic or blastic lesions noted in the visualized portions of the skeleton.  IMPRESSION: 1.  No acute findings in the abdomen or pelvis to account for the patient's symptoms.  Specifically, no abnormal urinary tract calculi. 2.  Normal appendix.   Original Report Authenticated By: Etheleen Mayhew, M.D.    Dg Abd Acute W/chest  01/07/2012  *RADIOLOGY REPORT*  Clinical Data: Left lower quadrant pain  ACUTE ABDOMEN SERIES (ABDOMEN 2 VIEW & CHEST 1 VIEW)  Comparison: Chest x-ray 05/14/2010  Findings: Prominent heart.  Negative for heart failure.  Lungs are clear without infiltrate or effusion.  Limited detail in the abdomen due to large patient size.  Negative for bowel obstruction.  Gas is present in nondilated large and small bowel.  Negative for free intraperitoneal air.  IMPRESSION: No acute abnormality.  The exam is limited by patient size.   Original Report Authenticated By: Truett Perna, M.D.       Date: 01/07/2012  Rate: 77  Rhythm: normal sinus rhythm  QRS Axis: normal  Intervals: normal  ST/T Wave abnormalities:   Conduction Disutrbances: none  Narrative Interpretation:  Normal EKG- multiple PACs  Old EKG Reviewed: No significant changes noted     No diagnosis found.    MDM  6:53 AM BP 133/73  Pulse 64  Temp 98.5 F (36.9 C)  Resp 20  SpO2 100% Patient physical exam unremarkable for acute abdomen. I suspect gas or constipation.  Patient with Decreased GFR, renal insufficiency.  Hypokalemia. Will get  an acute abdominal series.   8:52 AM CT abdomen negative for stones or other acute abnormalities.  I have advised patient that it is likely gas and that getting in a warm tub  Of water may help.  I also advised patient on healthy eating habits including a majority fresh fruits and vegetables, lean meats and plenty of water and exercise.   Patient with PACs on ekg and monitor.  Patient states that he gets winded when walking up steps and asks if this has something to do with PACs. I suspect that this has more to do with body habitus, but I am giving patient f.u with cardiology.  Discussed reasons to seek immediate care. Patient expresses understanding and agrees with plan.    Margarita Mail, PA-C 01/07/12 [place], PA-C 01/08/12 2125

## 2012-01-08 NOTE — ED Provider Notes (Signed)
Medical screening examination/treatment/procedure(s) were conducted as a shared visit with non-physician practitioner(s) and myself.  I personally evaluated the patient during the encounter   Malvin Johns, MD 01/08/12 2336

## 2012-05-30 ENCOUNTER — Encounter (HOSPITAL_COMMUNITY): Payer: Self-pay | Admitting: Emergency Medicine

## 2012-05-30 ENCOUNTER — Emergency Department (HOSPITAL_COMMUNITY): Payer: Managed Care, Other (non HMO)

## 2012-05-30 ENCOUNTER — Emergency Department (HOSPITAL_COMMUNITY)
Admission: EM | Admit: 2012-05-30 | Discharge: 2012-05-30 | Disposition: A | Discharge status: Home / Self Care | Payer: Managed Care, Other (non HMO) | Attending: Emergency Medicine | Admitting: Emergency Medicine

## 2012-05-30 DIAGNOSIS — Z79899 Other long term (current) drug therapy: Secondary | ICD-10-CM | POA: Insufficient documentation

## 2012-05-30 DIAGNOSIS — I10 Essential (primary) hypertension: Secondary | ICD-10-CM | POA: Insufficient documentation

## 2012-05-30 DIAGNOSIS — M109 Gout, unspecified: Secondary | ICD-10-CM

## 2012-05-30 MED ORDER — DEXAMETHASONE SODIUM PHOSPHATE 10 MG/ML IJ SOLN
10.0000 mg | Freq: Once | INTRAMUSCULAR | Status: AC
  Administered 2012-05-30: 10 mg via INTRAMUSCULAR
  Filled 2012-05-30: qty 1

## 2012-05-30 MED ORDER — INDOMETHACIN 25 MG PO CAPS: 25.0000 mg | ORAL_CAPSULE | Freq: Three times a day (TID) | ORAL | Status: DC

## 2012-05-30 MED ORDER — OXYCODONE-ACETAMINOPHEN 5-325 MG PO TABS: 1.0000 | ORAL_TABLET | ORAL | Status: DC | PRN

## 2012-05-30 NOTE — ED Notes (Signed)
Pt states has had gout in past in R foot, states he thinks it is now in L foot.

## 2012-05-30 NOTE — ED Provider Notes (Signed)
History    This chart was scribed for non-physician practitioner working with Delora Fuel, MD by Malen Gauze, ED Scribe. This patient was seen in room [place] and the patient's care was started at 6:03PM.     CSN: [id number]  Arrival date & time 05/30/12  1647   None     Chief Complaint  Patient presents with  . Foot Pain    (Consider location/radiation/quality/duration/timing/severity/associated sxs/prior treatment) The history is provided by the patient. No language interpreter was used.   Phillip [name] is a 52 y.o. male who presents to the Emergency Department complaining of constant, moderate left foot pain and swelling with an onset this morning around 14 hours ago. He reports a history of gout in his right foot, but he reports he thinks it is now present in his left foot. He has not taken any medications for his gout. He reports the pain woke him up from sleep. He reports warmness to his foot. He reports his gout is regularly followed by his PCP; he reports he would have just gone to his PCP's office if it was not the weekend. He denies any mechanism of injury to the foot. Denies HA, fever, neck pain, sore throat, rash, back pain, CP, SOB, abdominal pain, nausea, emesis, diarrhea, dysuria, or extremity weakness, numbness, or tingling. Denies history of cellulitis. No known allergies. No other pertinent medical symptoms.  Past Medical History  Diagnosis Date  . Hypertension   . Gout     Past Surgical History  Procedure Laterality Date  . Hernia repair      No family history on file.  History  Substance Use Topics  . Smoking status: Never Smoker   . Smokeless tobacco: Not on file  . Alcohol Use: No      Review of Systems 10 Systems reviewed and all are negative for acute change except as noted in the HPI.   Allergies  Review of patient's allergies indicates no known allergies.  Home Medications   Current Outpatient Rx  Name  Route  Sig  Dispense  Refill  .  amLODipine (NORVASC) 10 MG tablet   Oral   Take 10 mg by mouth daily.           . cloNIDine (CATAPRES) 0.1 MG tablet   Oral   Take 0.1 mg by mouth daily.           . hydrochlorothiazide (HYDRODIURIL) 25 MG tablet   Oral   Take 25 mg by mouth daily.           . potassium chloride SA (K-DUR,KLOR-CON) 20 MEQ tablet   Oral   Take 20 mEq by mouth daily.           [name] albuterol (PROVENTIL HFA;VENTOLIN HFA) 108 (90 BASE) MCG/ACT inhaler   Inhalation   Inhale 2 puffs into the lungs every 6 (six) hours as needed. For shortness of breath         . indomethacin (INDOCIN) 25 MG capsule   Oral   Take 1 capsule (25 mg total) by mouth 3 (three) times daily with meals. May take up to 50mg  three times a day if no improvement with 25mg .   30 capsule   0   . oxyCODONE-acetaminophen (PERCOCET) 5-325 MG per tablet   Oral   Take 1-2 tablets by mouth every 4 (four) hours as needed for pain.   10 tablet   0     BP 174/97  Pulse 97  Temp(Src) 97.8 F (  36.6 C) (Oral)  SpO2 100%  Physical Exam  Nursing note and vitals reviewed. Constitutional: He is oriented to person, place, and time. He appears well-developed.  HENT:  Head: Normocephalic.  Eyes: Conjunctivae are normal.  Neck: No tracheal deviation present.  Cardiovascular:  No murmur heard. Musculoskeletal: Normal range of motion. He exhibits edema and tenderness.  Left foot: Grossly swollen across the metatarsals with focal area of erythema over 1st MTP joint with warmth to the touch  Neurological: He is alert and oriented to person, place, and time. He has normal reflexes. No cranial nerve deficit. Coordination normal.  Skin: Skin is warm.  Psychiatric: He has a normal mood and affect.    ED Course  Procedures (including critical care time)  DIAGNOSTIC STUDIES: Oxygen Saturation is 100% on room air, normal by my interpretation.    COORDINATION OF CARE:  6:25PM - he will be given crutches, dexamethasone injection,  indomethacin, and percocet to alleviate the pain. He is advised to f/u with his PCP for further management of his chronic foot pain. He is ready for d/c.   Labs Reviewed - No data to display No results found.   1. Podagra       MDM  Patient with sxs consistent with gout flair. Treatment described above.  I have warned patient of other possiblities such as developing cellulitis.  Patient expresses understanding of sxs and is aware of reasons to retur to the ed or seek immediated medical care.   I personally performed the services described in this documentation, which was scribed in my presence. The recorded information has been reviewed and is accurate.        Margarita Mail, PA-C 05/31/12 [phone]

## 2012-05-30 NOTE — ED Notes (Signed)
Awaiting crutches

## 2012-05-30 NOTE — ED Notes (Signed)
D/c instructions reviewed w/ pt and family - pt and family deny any further questions or concerns at present. Pt ambulating independently w/ assistance of crutches w/ steady gait on d/c in no acute distress, A&Ox4. Rx given x2

## 2012-05-31 NOTE — ED Provider Notes (Signed)
Medical screening examination/treatment/procedure(s) were performed by non-physician practitioner and as supervising physician I was immediately available for consultation/collaboration.   Delora Fuel, MD [id number]

## 2012-12-13 ENCOUNTER — Emergency Department (HOSPITAL_COMMUNITY): Payer: Managed Care, Other (non HMO)

## 2012-12-13 ENCOUNTER — Encounter (HOSPITAL_COMMUNITY): Payer: Self-pay | Admitting: Emergency Medicine

## 2012-12-13 ENCOUNTER — Emergency Department (HOSPITAL_COMMUNITY)
Admission: EM | Admit: 2012-12-13 | Discharge: 2012-12-14 | Disposition: A | Discharge status: Home / Self Care | Payer: Managed Care, Other (non HMO) | Attending: Emergency Medicine | Admitting: Emergency Medicine

## 2012-12-13 DIAGNOSIS — Z862 Personal history of diseases of the blood and blood-forming organs and certain disorders involving the immune mechanism: Secondary | ICD-10-CM | POA: Insufficient documentation

## 2012-12-13 DIAGNOSIS — Y929 Unspecified place or not applicable: Secondary | ICD-10-CM | POA: Insufficient documentation

## 2012-12-13 DIAGNOSIS — Z8639 Personal history of other endocrine, nutritional and metabolic disease: Secondary | ICD-10-CM | POA: Insufficient documentation

## 2012-12-13 DIAGNOSIS — X500XXA Overexertion from strenuous movement or load, initial encounter: Secondary | ICD-10-CM | POA: Insufficient documentation

## 2012-12-13 DIAGNOSIS — I10 Essential (primary) hypertension: Secondary | ICD-10-CM | POA: Insufficient documentation

## 2012-12-13 DIAGNOSIS — S8990XA Unspecified injury of unspecified lower leg, initial encounter: Secondary | ICD-10-CM | POA: Insufficient documentation

## 2012-12-13 DIAGNOSIS — Z79899 Other long term (current) drug therapy: Secondary | ICD-10-CM | POA: Insufficient documentation

## 2012-12-13 DIAGNOSIS — Y9389 Activity, other specified: Secondary | ICD-10-CM | POA: Insufficient documentation

## 2012-12-13 DIAGNOSIS — M25562 Pain in left knee: Secondary | ICD-10-CM

## 2012-12-13 NOTE — ED Notes (Signed)
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 Pt presents with ice to the extremity. Pt is A/O x4 and in NAD.

## 2012-12-14 MED ORDER — NAPROXEN 500 MG PO TABS: 500.0000 mg | ORAL_TABLET | Freq: Two times a day (BID) | ORAL | Status: DC

## 2012-12-14 MED ORDER — HYDROCODONE-ACETAMINOPHEN 5-325 MG PO TABS
1.0000 | ORAL_TABLET | Freq: Once | ORAL | Status: AC
  Administered 2012-12-14: 1 via ORAL
  Filled 2012-12-14: qty 1

## 2012-12-14 MED ORDER — HYDROCODONE-ACETAMINOPHEN 5-325 MG PO TABS: 1.0000 | ORAL_TABLET | ORAL | Status: DC | PRN

## 2012-12-14 NOTE — ED Provider Notes (Signed)
CSN: [id number]     Arrival date & time 12/13/12  2055 History   First MD Initiated Contact with Patient 12/13/12 2322     Chief Complaint  Patient presents with  . Knee Injury   HPI  History provided by the patient. Patient is a 52 year old male with history of hypertension and gout who presents with complaints of left knee pain injury. 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 Since that time he has had continued pain and some swelling in the knee area. She does report having old injuries to both his knees he states came from heavy use and when he played football. He reports history of meniscal tear in his left knee but states he was never able to have surgery. He states his knee sometimes cause him problems however his pain injury today are much worse. Pain does not radiate. He denies any weakness or numbness in lower leg or foot. Pain is worse with moving and with bearing weight. He did apply ice which has only helped slightly. He has not used any medications or other treatments for symptoms. Denies any other aggravating or alleviating factors. No other injuries or complaints.     Past Medical History  Diagnosis Date  . Hypertension   . Gout    Past Surgical History  Procedure Laterality Date  . Hernia repair     No family history on file. History  Substance Use Topics  . Smoking status: Never Smoker   . Smokeless tobacco: Not on file  . Alcohol Use: No    Review of Systems  Musculoskeletal: Negative for back pain.  Neurological: Negative for weakness and numbness.  All other systems reviewed and are negative.    Allergies  Review of patient's allergies indicates no known allergies.  Home Medications   Current Outpatient Rx  Name  Route  Sig  Dispense  Refill  . amLODipine (NORVASC) 10 MG tablet   Oral   Take 10 mg by mouth daily.           . cloNIDine  (CATAPRES) 0.1 MG tablet   Oral   Take 0.1 mg by mouth daily.           . hydrochlorothiazide (HYDRODIURIL) 25 MG tablet   Oral   Take 25 mg by mouth daily.           . potassium chloride SA (K-DUR,KLOR-CON) 20 MEQ tablet   Oral   Take 20 mEq by mouth daily.           [name] albuterol (PROVENTIL HFA;VENTOLIN HFA) 108 (90 BASE) MCG/ACT inhaler   Inhalation   Inhale 2 puffs into the lungs every 6 (six) hours as needed. For shortness of breath         . HYDROcodone-acetaminophen (NORCO) 5-325 MG per tablet   Oral   Take 1-2 tablets by mouth every 4 (four) hours as needed for pain.   10 tablet   0   . naproxen (NAPROSYN) 500 MG tablet   Oral   Take 1 tablet (500 mg total) by mouth 2 (two) times daily.   30 tablet   0    BP 168/82  Pulse 83  Temp(Src) 98.3 F (36.8 C) (Oral)  Resp 20  SpO2 94% Physical Exam  Nursing note and vitals reviewed. Constitutional: He is oriented to person, place, and time. He  appears well-developed and well-nourished.  Morbidly obese  HENT:  Head: Normocephalic and atraumatic.  Cardiovascular: Normal rate and regular rhythm.   Pulmonary/Chest: Effort normal and breath sounds normal.  Abdominal: Soft.  Musculoskeletal:       Lumbar back: Normal.  Exam is somewhat limited secondary to patient habitus and his pains. There is reduced range of motion of the left knee. He does exhibit tenderness throughout the knee primarily the anterior aspect. There is no mass or deformity. Skin appears normal without erythema or change in temperature. Patient has normal distal pulses and sensation in the foot.  No pain or reduced range of motion of the left hip.  Neurological: He is alert and oriented to person, place, and time. He has normal strength. No sensory deficit.  Skin: Skin is warm. No rash noted. No erythema.  Psychiatric: He has a normal mood and affect. His behavior is normal.    ED Course  Procedures   Imaging Review Dg Knee Complete 4  Views Left  12/13/2012   CLINICAL DATA:  Fall. Left knee pain.  EXAM: LEFT KNEE - COMPLETE 4+ VIEW  COMPARISON:  Plain films left knee 09/17/2010.  FINDINGS: No acute bony or joint abnormality is identified. Small to moderate joint effusion is noted. Again seen is advanced tricompartmental osteoarthritis appearing worst about the medial compartment.  IMPRESSION: Negative for fracture.  Advanced tricompartmental osteoarthritis and small to moderate joint effusion.   Electronically Signed   By: Inge Rise M.D.   On: 12/13/2012 22:15    MDM   1. Knee pain, acute, left    Patient seen and evaluated. Patient appears well in no acute distress. X-rays reviewed without signs of fracture or dislocation of the knee. He does have significant pain and swelling around the knee. Will place a knee immobilizer. Given his weight he will be unable to use crutches. We'll recommend he use assisted walking device from a medical supply store such as walker.  Martie Lee, PA-C 12/14/12 [phone]

## 2012-12-14 NOTE — ED Provider Notes (Signed)
Medical screening examination/treatment/procedure(s) were performed by non-physician practitioner and as supervising physician I was immediately available for consultation/collaboration.  Hoy Morn, MD 12/14/12 0400

## 2013-01-26 ENCOUNTER — Emergency Department (HOSPITAL_COMMUNITY)
Admission: EM | Admit: 2013-01-26 | Discharge: 2013-01-26 | Disposition: A | Discharge status: Home / Self Care | Payer: Managed Care, Other (non HMO) | Attending: Emergency Medicine | Admitting: Emergency Medicine

## 2013-01-26 ENCOUNTER — Encounter (HOSPITAL_COMMUNITY): Payer: Self-pay | Admitting: Emergency Medicine

## 2013-01-26 ENCOUNTER — Emergency Department (HOSPITAL_COMMUNITY): Payer: Managed Care, Other (non HMO)

## 2013-01-26 DIAGNOSIS — R1084 Generalized abdominal pain: Secondary | ICD-10-CM | POA: Insufficient documentation

## 2013-01-26 DIAGNOSIS — Z862 Personal history of diseases of the blood and blood-forming organs and certain disorders involving the immune mechanism: Secondary | ICD-10-CM | POA: Insufficient documentation

## 2013-01-26 DIAGNOSIS — R0602 Shortness of breath: Secondary | ICD-10-CM | POA: Insufficient documentation

## 2013-01-26 DIAGNOSIS — R944 Abnormal results of kidney function studies: Secondary | ICD-10-CM | POA: Insufficient documentation

## 2013-01-26 DIAGNOSIS — Z79899 Other long term (current) drug therapy: Secondary | ICD-10-CM | POA: Insufficient documentation

## 2013-01-26 DIAGNOSIS — R110 Nausea: Secondary | ICD-10-CM | POA: Insufficient documentation

## 2013-01-26 DIAGNOSIS — N289 Disorder of kidney and ureter, unspecified: Secondary | ICD-10-CM

## 2013-01-26 DIAGNOSIS — Z8639 Personal history of other endocrine, nutritional and metabolic disease: Secondary | ICD-10-CM | POA: Insufficient documentation

## 2013-01-26 DIAGNOSIS — R109 Unspecified abdominal pain: Secondary | ICD-10-CM

## 2013-01-26 DIAGNOSIS — D709 Neutropenia, unspecified: Secondary | ICD-10-CM | POA: Insufficient documentation

## 2013-01-26 DIAGNOSIS — I10 Essential (primary) hypertension: Secondary | ICD-10-CM | POA: Insufficient documentation

## 2013-01-26 LAB — CBC WITH DIFFERENTIAL/PLATELET
Basophils Absolute: 0 10*3/uL (ref 0.0–0.1)
Basophils Relative: 0 % (ref 0–1)
Eosinophils Relative: 0 % (ref 0–5)
HCT: 44.6 % (ref 39.0–52.0)
Lymphocytes Relative: 53 % — ABNORMAL HIGH (ref 12–46)
MCHC: 33 g/dL (ref 30.0–36.0)
MCV: 82.3 fL (ref 78.0–100.0)
Monocytes Absolute: 0.5 10*3/uL (ref 0.1–1.0)
Neutro Abs: 1 10*3/uL — ABNORMAL LOW (ref 1.7–7.7)
Platelets: 197 10*3/uL (ref 150–400)
RDW: 13 % (ref 11.5–15.5)
WBC: 3.3 10*3/uL — ABNORMAL LOW (ref 4.0–10.5)

## 2013-01-26 LAB — COMPREHENSIVE METABOLIC PANEL
ALT: 28 U/L (ref 0–53)
AST: 32 U/L (ref 0–37)
Albumin: 3.9 g/dL (ref 3.5–5.2)
CO2: 31 mEq/L (ref 19–32)
Calcium: 9.7 mg/dL (ref 8.4–10.5)
Creatinine, Ser: 2.14 mg/dL — ABNORMAL HIGH (ref 0.50–1.35)
Sodium: 136 mEq/L (ref 135–145)
Total Protein: 7.5 g/dL (ref 6.0–8.3)

## 2013-01-26 LAB — URINALYSIS, ROUTINE W REFLEX MICROSCOPIC
Ketones, ur: NEGATIVE mg/dL
Leukocytes, UA: NEGATIVE
Protein, ur: NEGATIVE mg/dL
Urobilinogen, UA: 1 mg/dL (ref 0.0–1.0)

## 2013-01-26 MED ORDER — POTASSIUM CHLORIDE CRYS ER 20 MEQ PO TBCR
40.0000 meq | EXTENDED_RELEASE_TABLET | Freq: Once | ORAL | Status: AC
  Administered 2013-01-26: 40 meq via ORAL
  Filled 2013-01-26: qty 2

## 2013-01-26 MED ORDER — IOHEXOL 300 MG/ML  SOLN
50.0000 mL | Freq: Once | INTRAMUSCULAR | Status: AC | PRN
  Administered 2013-01-26: 50 mL via ORAL

## 2013-01-26 MED ORDER — SODIUM CHLORIDE 0.9 % IV BOLUS (SEPSIS)
1000.0000 mL | Freq: Once | INTRAVENOUS | Status: AC
  Administered 2013-01-26: 1000 mL via INTRAVENOUS

## 2013-01-26 NOTE — ED Provider Notes (Signed)
CSN: [id number]     Arrival date & time 01/26/13  0050 History   First MD Initiated Contact with Patient 01/26/13 0055     Chief Complaint  Patient presents with  . Abdominal Pain   (Consider location/radiation/quality/duration/timing/severity/associated sxs/prior Treatment) HPI Comments: Patient is a 52 year old male with a history of hypertension who presents for abdominal pain with onset 4 days ago. Patient states the pain is intermittent and radiates from his left mid abdomen to his right mid abdomen. Patient states the pain is brought on with eating and without alleviating factors. He states the pain is usually relieved after an hour or so. He has not tried anything over-the-counter for pain relief. Patient endorses associated fever 102.32F orally 2 days ago as well as darker colored urine with a foul smelling odor. He further endorses associated intermittent nausea, shortness of breath with worsening pain, and clay-colored stool with normal consistency. Patient denies associated chest pain, vomiting, diarrhea, melena or hematochezia, dysuria or hematuria, and numbness or tingling. Patient denies having any abdominal pain at this time.  Patient is a 52 y.o. male presenting with abdominal pain. The history is provided by the patient. No language interpreter was used.  Abdominal Pain Associated symptoms: fever, nausea and shortness of breath   Associated symptoms: no chest pain, no diarrhea, no dysuria, no hematuria and no vomiting     Past Medical History  Diagnosis Date  . Hypertension   . Gout    Past Surgical History  Procedure Laterality Date  . Hernia repair     Family History  Problem Relation Age of Onset  . Diabetes Father   . Hypertension Other   . Diabetes Other    History  Substance Use Topics  . Smoking status: Never Smoker   . Smokeless tobacco: Not on file  . Alcohol Use: No    Review of Systems  Constitutional: Positive for fever.  Respiratory: Positive for  shortness of breath.   Cardiovascular: Negative for chest pain.  Gastrointestinal: Positive for nausea and abdominal pain. Negative for vomiting, diarrhea and blood in stool.  Genitourinary: Negative for dysuria and hematuria.  All other systems reviewed and are negative.    Allergies  Review of patient's allergies indicates no known allergies.  Home Medications   Current Outpatient Rx  Name  Route  Sig  Dispense  Refill  . amLODipine (NORVASC) 10 MG tablet   Oral   Take 10 mg by mouth daily.           . cloNIDine (CATAPRES) 0.1 MG tablet   Oral   Take 0.1 mg by mouth daily.           . hydrochlorothiazide (HYDRODIURIL) 25 MG tablet   Oral   Take 25 mg by mouth daily.           . potassium chloride SA (K-DUR,KLOR-CON) 20 MEQ tablet   Oral   Take 20 mEq by mouth daily.           [name] albuterol (PROVENTIL HFA;VENTOLIN HFA) 108 (90 BASE) MCG/ACT inhaler   Inhalation   Inhale 2 puffs into the lungs every 6 (six) hours as needed. For shortness of breath          BP 106/74  Pulse 125  Temp(Src) 99.4 F (37.4 C) (Oral)  Resp 18  Ht 5\' 9"  (1.753 m)  Wt 340 lb (154.223 kg)  BMI 50.19 kg/m2  SpO2 99%  Physical Exam  Nursing note and vitals reviewed. Constitutional: He  is oriented to person, place, and time. He appears well-developed and well-nourished. No distress.  HENT:  Head: Normocephalic and atraumatic.  Eyes: Conjunctivae and EOM are normal. No scleral icterus.  Neck: Normal range of motion.  Cardiovascular: Normal rate, regular rhythm and normal heart sounds.   Pulmonary/Chest: Effort normal and breath sounds normal. No respiratory distress. He has no wheezes. He has no rales.  Abdominal: Soft. There is no rebound and no guarding.  Extremely large body habitus. No peritoneal signs or evidence of acute surgical abdomen. Mild diffuse tenderness appreciated.  Musculoskeletal: Normal range of motion.  Neurological: He is alert and oriented to person, place,  and time.  Skin: Skin is warm and dry. No rash noted. He is not diaphoretic. No erythema. No pallor.  Psychiatric: He has a normal mood and affect. His behavior is normal.    ED Course  Procedures (including critical care time) Labs Review Labs Reviewed  CBC WITH DIFFERENTIAL - Abnormal; Notable for the following:    WBC 3.3 (*)    Neutrophils Relative % 31 (*)    Neutro Abs 1.0 (*)    Lymphocytes Relative 53 (*)    Monocytes Relative 16 (*)    All other components within normal limits  COMPREHENSIVE METABOLIC PANEL - Abnormal; Notable for the following:    Potassium 2.8 (*)    Chloride 93 (*)    Glucose, Bld 121 (*)    Creatinine, Ser 2.14 (*)    GFR calc non Af Amer 34 (*)    GFR calc Af Amer 40 (*)    All other components within normal limits  URINALYSIS, ROUTINE W REFLEX MICROSCOPIC  LIPASE, BLOOD   Imaging Review Ct Abdomen Pelvis Wo Contrast  01/26/2013   CLINICAL DATA:  Abdominal pain. Dark urine.  EXAM: CT ABDOMEN AND PELVIS WITHOUT CONTRAST  TECHNIQUE: Multidetector CT imaging of the abdomen and pelvis was performed following the standard protocol without intravenous contrast.  COMPARISON:  CT of the abdomen and pelvis 01/07/2012.  FINDINGS: Lung Bases: Unremarkable.  Abdomen/Pelvis: There are no abnormal calcifications within the collecting system of either kidney, along the course of either ureter, or within the lumen of the urinary bladder. No hydroureteronephrosis or perinephric stranding to suggest urinary tract obstruction at this time. The unenhanced appearance of the kidneys is unremarkable bilaterally.  The unenhanced appearance of the liver, gallbladder, pancreas, spleen and bilateral adrenal glands is unremarkable. Mild atherosclerosis throughout the abdominal and pelvic vasculature, without definite aneurysm. No significant volume of ascites. No pneumoperitoneum. No pathologic distention of small bowel. Normal appendix. No definite lymphadenopathy identified within  the abdomen or pelvis on today's non contrast CT examination. Prostate gland and urinary bladder are unremarkable in appearance.  Musculoskeletal: There are no aggressive appearing lytic or blastic lesions noted in the visualized portions of the skeleton.  IMPRESSION: 1. No acute findings in the abdomen or pelvis to account for the patient's symptoms. 2. Normal appendix.   Electronically Signed   By: Vinnie Langton M.D.   On: 01/26/2013 05:09    EKG Interpretation   None       MDM   1. Kidney function abnormal   2. Neutropenia   3. Abdominal pain    52 year old male with a history of hypertension presents for abdominal pain. Abdominal pain is sporadic and associated with eating, radiating from his left midabdomen to his right midabdomen. Physical exam significant for tenderness in the right upper quadrant as well as left lower quadrant and right  lower quadrant. No peritoneal signs or evidence of acute surgical abdomen. Workup to include CBC, CMP, lipase, and UA as well as CT abdomen and pelvis. Patient declines pain medicine as he says his abdomen is not hurting him at this time.  Labs today significant for neutropenia as well as hypokalemia, and elevated creatinine. Kidney function significantly worsened since the patient's last visit a year ago. CT abdomen and pelvis with no acute intra abdominal processes to explain patient's pain. He is remain well and nontoxic appearing, hemodynamically stable, and afebrile throughout ED course. Potassium repleted orally today.  Have reviewed patient's findings with him. I have discussed with the patient I do not believe further workup is indicated at this time; however, I do believe he warrants close follow up with his primary care provider regarding his worsening kidney function and neutropenia. Patient verbalizes understanding. He is stable and appropriate for discharge with primary care followup. Return precautions discussed and patient agreeable to  plan with no unaddressed concerns.  Antonietta Breach, PA-C 01/26/13 2100

## 2013-01-26 NOTE — ED Notes (Signed)
Pt states he is having abd pain that starts on the left side and radiates around to the right  Pt states the pain varies in intensity from a 2 to a 5 on the pain scale  Pt states he noticed his urine in dark in color and has an odor  Pt states he has been drinking plenty of fluids  Mild nausea without vomiting

## 2013-02-01 NOTE — ED Provider Notes (Signed)
Medical screening examination/treatment/procedure(s) were conducted as a shared visit with non-physician practitioner(s) and myself.  I personally evaluated the patient during the encounter.  Morbidly obese male with worsening abd pain, concern for diverticulitis, difficult exam due to body habitus.  Plan for CT scan  Kalman Drape, MD 02/01/13 [phone]

## 2013-05-29 ENCOUNTER — Emergency Department (HOSPITAL_COMMUNITY)
Admission: EM | Admit: 2013-05-29 | Discharge: 2013-05-29 | Disposition: A | Discharge status: Home / Self Care | Payer: Managed Care, Other (non HMO) | Attending: Emergency Medicine | Admitting: Emergency Medicine

## 2013-05-29 ENCOUNTER — Encounter (HOSPITAL_COMMUNITY): Payer: Self-pay | Admitting: Emergency Medicine

## 2013-05-29 DIAGNOSIS — I10 Essential (primary) hypertension: Secondary | ICD-10-CM | POA: Insufficient documentation

## 2013-05-29 DIAGNOSIS — Z862 Personal history of diseases of the blood and blood-forming organs and certain disorders involving the immune mechanism: Secondary | ICD-10-CM | POA: Insufficient documentation

## 2013-05-29 DIAGNOSIS — T148XXA Other injury of unspecified body region, initial encounter: Secondary | ICD-10-CM

## 2013-05-29 DIAGNOSIS — Z8639 Personal history of other endocrine, nutritional and metabolic disease: Secondary | ICD-10-CM | POA: Insufficient documentation

## 2013-05-29 DIAGNOSIS — Z79899 Other long term (current) drug therapy: Secondary | ICD-10-CM | POA: Insufficient documentation

## 2013-05-29 DIAGNOSIS — Y93E1 Activity, personal bathing and showering: Secondary | ICD-10-CM | POA: Insufficient documentation

## 2013-05-29 DIAGNOSIS — Y9289 Other specified places as the place of occurrence of the external cause: Secondary | ICD-10-CM | POA: Insufficient documentation

## 2013-05-29 DIAGNOSIS — IMO0002 Reserved for concepts with insufficient information to code with codable children: Secondary | ICD-10-CM | POA: Insufficient documentation

## 2013-05-29 DIAGNOSIS — Z8719 Personal history of other diseases of the digestive system: Secondary | ICD-10-CM | POA: Insufficient documentation

## 2013-05-29 DIAGNOSIS — X58XXXA Exposure to other specified factors, initial encounter: Secondary | ICD-10-CM | POA: Insufficient documentation

## 2013-05-29 DIAGNOSIS — R1031 Right lower quadrant pain: Secondary | ICD-10-CM

## 2013-05-29 DIAGNOSIS — Z9889 Other specified postprocedural states: Secondary | ICD-10-CM | POA: Insufficient documentation

## 2013-05-29 LAB — URINALYSIS, ROUTINE W REFLEX MICROSCOPIC
Bilirubin Urine: NEGATIVE
Glucose, UA: NEGATIVE mg/dL
Hgb urine dipstick: NEGATIVE
Ketones, ur: NEGATIVE mg/dL
LEUKOCYTES UA: NEGATIVE
NITRITE: NEGATIVE
PH: 6 (ref 5.0–8.0)
Protein, ur: 30 mg/dL — AB
Specific Gravity, Urine: 1.02 (ref 1.005–1.030)
Urobilinogen, UA: 0.2 mg/dL (ref 0.0–1.0)

## 2013-05-29 LAB — CBC WITH DIFFERENTIAL/PLATELET
BASOS ABS: 0 10*3/uL (ref 0.0–0.1)
BASOS PCT: 1 % (ref 0–1)
Eosinophils Absolute: 0 10*3/uL (ref 0.0–0.7)
Eosinophils Relative: 1 % (ref 0–5)
HCT: 43.1 % (ref 39.0–52.0)
HEMOGLOBIN: 13.8 g/dL (ref 13.0–17.0)
LYMPHS PCT: 35 % (ref 12–46)
Lymphs Abs: 1.8 10*3/uL (ref 0.7–4.0)
MCH: 26.8 pg (ref 26.0–34.0)
MCHC: 32 g/dL (ref 30.0–36.0)
MCV: 83.9 fL (ref 78.0–100.0)
Monocytes Absolute: 0.4 10*3/uL (ref 0.1–1.0)
Monocytes Relative: 7 % (ref 3–12)
NEUTROS ABS: 3 10*3/uL (ref 1.7–7.7)
Neutrophils Relative %: 57 % (ref 43–77)
Platelets: 217 10*3/uL (ref 150–400)
RBC: 5.14 MIL/uL (ref 4.22–5.81)
RDW: 14.2 % (ref 11.5–15.5)
WBC: 5.2 10*3/uL (ref 4.0–10.5)

## 2013-05-29 LAB — URINE MICROSCOPIC-ADD ON

## 2013-05-29 LAB — BASIC METABOLIC PANEL
BUN: 20 mg/dL (ref 6–23)
CHLORIDE: 100 meq/L (ref 96–112)
CO2: 31 mEq/L (ref 19–32)
Calcium: 9.7 mg/dL (ref 8.4–10.5)
Creatinine, Ser: 1.48 mg/dL — ABNORMAL HIGH (ref 0.50–1.35)
GFR calc non Af Amer: 53 mL/min — ABNORMAL LOW (ref >90)
GFR, EST AFRICAN AMERICAN: 62 mL/min — AB (ref >90)
Glucose, Bld: 91 mg/dL (ref 70–99)
POTASSIUM: 4 meq/L (ref 3.7–5.3)
Sodium: 144 mEq/L (ref 137–147)

## 2013-05-29 NOTE — ED Provider Notes (Signed)
Medical screening examination/treatment/procedure(s) were conducted as a shared visit with non-physician practitioner(s) and myself.  I personally evaluated the patient during the encounter.   EKG Interpretation None      53 year old obese male presenting with right lower quadrant pain. Intermittent. Well-appearing, nontoxic, obese, no tenderness in the right lower quadrant or anywhere else in his abdomen. Pain is reproduced when he sits up from a supine position. No pain with passive range of motion of right hip. No hernia appreciated on genitourinary exam, although somewhat limited by morbid obesity. His symptoms seem most consistent with musculoskeletal pain/muscle strain.  Will treat with NSAIDs and have him follow up with his PCP.  Clinical Impression: 1. RLQ abdominal pain   2. Muscle strain       Arbie Cookey, MD 05/29/13 2044

## 2013-05-29 NOTE — ED Notes (Signed)
He c/o rlq area abd. Pain since Fri. (2 days ago), which is slowly becoming more pronounced.  He denies fever/n/v/d and is in no distress.

## 2013-05-29 NOTE — ED Provider Notes (Signed)
CSN: [id number]     Arrival date & time 05/29/13  1741 History   First MD Initiated Contact with Patient 05/29/13 1848     Chief Complaint  Patient presents with  . Abdominal Pain     (Consider location/radiation/quality/duration/timing/severity/associated sxs/prior Treatment) HPI Pt is a morbidly obese 53yo male with hx of HTN and gout c/o RLQ pain that started on Friday, 3/6, while pt was in the shower. Pt states pain is sharp and stabbing in nature, [id number] with certain movements that only last a few seconds at a time. Reports aching, 2/10 when sharp pain is not present. Does report hx of gallbladder problems but states this pain does not feel similar, denies upper abdominal pain. Reports hx of umbilical hernia repair in 1992, no other abdominal surgeries.  Denies fever, n/v/d.  Denies known injury.  Has not taken any pain medication PTA.  Past Medical History  Diagnosis Date  . Hypertension   . Gout    Past Surgical History  Procedure Laterality Date  . Hernia repair     Family History  Problem Relation Age of Onset  . Diabetes Father   . Hypertension Other   . Diabetes Other    History  Substance Use Topics  . Smoking status: Never Smoker   . Smokeless tobacco: Not on file  . Alcohol Use: No    Review of Systems  Constitutional: Negative for fever and chills.  Gastrointestinal: Positive for anal bleeding ( RLQ). Negative for nausea, vomiting and diarrhea.  Genitourinary: Negative for dysuria, frequency and hematuria.  Musculoskeletal: Negative for back pain.  All other systems reviewed and are negative.      Allergies  Review of patient's allergies indicates no known allergies.  Home Medications   Current Outpatient Rx  Name  Route  Sig  Dispense  Refill  . albuterol (PROVENTIL HFA;VENTOLIN HFA) 108 (90 BASE) MCG/ACT inhaler   Inhalation   Inhale 2 puffs into the lungs every 6 (six) hours as needed for shortness of breath.          [name] amLODipine (NORVASC) 10  MG tablet   Oral   Take 10 mg by mouth every morning.          . cloNIDine (CATAPRES) 0.1 MG tablet   Oral   Take 0.1 mg by mouth every morning.          . naproxen sodium (ANAPROX) 220 MG tablet   Oral   Take 220 mg by mouth 2 (two) times daily as needed (pain).          BP 145/66  Pulse 73  Temp(Src) 99.1 F (37.3 C) (Oral)  Resp 18  SpO2 96% Physical Exam  Nursing note and vitals reviewed. Constitutional: He appears well-developed and well-nourished.  Morbidly obese male standing next to exam bed, texting, NAD. Able to get back into exam bed w/o difficulty.  HENT:  Head: Normocephalic and atraumatic.  Eyes: Conjunctivae are normal. No scleral icterus.  Neck: Normal range of motion. Neck supple.  Cardiovascular: Normal rate, regular rhythm and normal heart sounds.   Pulmonary/Chest: Effort normal and breath sounds normal. No respiratory distress. He has no wheezes. He has no rales. He exhibits no tenderness.  Abdominal: Soft. Bowel sounds are normal. He exhibits no distension and no mass. There is no tenderness. There is no rebound and no guarding.  Obese abdomen, soft, non-tender. PE limited by body habitus.  Pain reproduced by having pt sit up.    Musculoskeletal: Normal  range of motion.  Neurological: He is alert.  Skin: Skin is warm and dry.    ED Course  Procedures (including critical care time) Labs Review Labs Reviewed  BASIC METABOLIC PANEL - Abnormal; Notable for the following:    Creatinine, Ser 1.48 (*)    GFR calc non Af Amer 53 (*)    GFR calc Af Amer 62 (*)    All other components within normal limits  URINALYSIS, ROUTINE W REFLEX MICROSCOPIC - Abnormal; Notable for the following:    Protein, ur 30 (*)    All other components within normal limits  CBC WITH DIFFERENTIAL  URINE MICROSCOPIC-ADD ON   Imaging Review No results found.   EKG Interpretation None      MDM   Final diagnoses:  RLQ abdominal pain  Muscle strain    pt c/o  intermittent RLQ pain that started Friday, 3/6. Denies fever, n/v/d. abd exam-limited due to body habitus. Soft, non-tender.  Pain reproduced by having pt sit up in exam bed.   Labs-CBC, BMP, UA: unremarkable.   Discussed pt with Dr. Doy Mince, will tx as muscle strain, acetaminophen and ibuprofen. Not concerned for surgical abdomen at this time, specifically appendicitis, hernia, or SBO.  Will discharge home to f/u with PCP. Return precautions provided. Pt verbalized understanding and agreement with tx plan.     Noland Fordyce, PA-C 05/29/13 2054

## 2013-05-29 NOTE — ED Notes (Signed)
Pt aware urine sample is needed 

## 2013-05-30 NOTE — ED Provider Notes (Signed)
Medical screening examination/treatment/procedure(s) were conducted as a shared visit with non-physician practitioner(s) and myself.  I personally evaluated the patient during the encounter.   EKG Interpretation None        Houston Siren III, MD 05/30/13 1329

## 2014-01-22 DIAGNOSIS — I509 Heart failure, unspecified: Secondary | ICD-10-CM

## 2014-01-22 HISTORY — DX: Heart failure, unspecified: I50.9

## 2014-05-05 ENCOUNTER — Emergency Department (HOSPITAL_COMMUNITY): Payer: Managed Care, Other (non HMO)

## 2014-05-05 ENCOUNTER — Encounter (HOSPITAL_COMMUNITY): Payer: Self-pay | Admitting: Emergency Medicine

## 2014-05-05 ENCOUNTER — Emergency Department (HOSPITAL_COMMUNITY)
Admission: EM | Admit: 2014-05-05 | Discharge: 2014-05-05 | Disposition: A | Discharge status: Home / Self Care | Payer: Managed Care, Other (non HMO) | Attending: Emergency Medicine | Admitting: Emergency Medicine

## 2014-05-05 DIAGNOSIS — M109 Gout, unspecified: Secondary | ICD-10-CM | POA: Diagnosis not present

## 2014-05-05 DIAGNOSIS — Z79899 Other long term (current) drug therapy: Secondary | ICD-10-CM | POA: Insufficient documentation

## 2014-05-05 DIAGNOSIS — J069 Acute upper respiratory infection, unspecified: Secondary | ICD-10-CM | POA: Insufficient documentation

## 2014-05-05 DIAGNOSIS — J029 Acute pharyngitis, unspecified: Secondary | ICD-10-CM | POA: Diagnosis present

## 2014-05-05 DIAGNOSIS — I10 Essential (primary) hypertension: Secondary | ICD-10-CM | POA: Insufficient documentation

## 2014-05-05 DIAGNOSIS — Z791 Long term (current) use of non-steroidal anti-inflammatories (NSAID): Secondary | ICD-10-CM | POA: Insufficient documentation

## 2014-05-05 MED ORDER — GI COCKTAIL ~~LOC~~
30.0000 mL | Freq: Once | ORAL | Status: AC
  Administered 2014-05-05: 30 mL via ORAL
  Filled 2014-05-05: qty 30

## 2014-05-05 MED ORDER — ALBUTEROL SULFATE HFA 108 (90 BASE) MCG/ACT IN AERS
2.0000 | INHALATION_SPRAY | RESPIRATORY_TRACT | Status: DC
  Administered 2014-05-05: 2 via RESPIRATORY_TRACT
  Filled 2014-05-05: qty 6.7

## 2014-05-05 NOTE — ED Notes (Addendum)
Pt reports he uses CPAP at home, states machine drained out of water and pt woke up SOB. Pt states he has hx of asthma but has run out of albuterol. Pt is alert and oriented. Pt also reports sore throat.

## 2014-05-05 NOTE — ED Notes (Signed)
Pt reports his cpap machine did not drain out of water, his sinuses were running and he was blowing out yellow phlegm.

## 2014-05-05 NOTE — Discharge Instructions (Signed)
Upper Respiratory Infection, Adult An upper respiratory infection (URI) is also sometimes known as the common cold. The upper respiratory tract includes the nose, sinuses, throat, trachea, and bronchi. Bronchi are the airways leading to the lungs. Most people improve within 1 week, but symptoms can last up to 2 weeks. A residual cough may last even longer.  CAUSES Many different viruses can infect the tissues lining the upper respiratory tract. The tissues become irritated and inflamed and often become very moist. Mucus production is also common. A cold is contagious. You can easily spread the virus to others by oral contact. This includes kissing, sharing a glass, coughing, or sneezing. Touching your mouth or nose and then touching a surface, which is then touched by another person, can also spread the virus. SYMPTOMS  Symptoms typically develop 1 to 3 days after you come in contact with a cold virus. Symptoms vary from person to person. They may include:  Runny nose.  Sneezing.  Nasal congestion.  Sinus irritation.  Sore throat.  Loss of voice (laryngitis).  Cough.  Fatigue.  Muscle aches.  Loss of appetite.  Headache.  Low-grade fever. DIAGNOSIS  You might diagnose your own cold based on familiar symptoms, since most people get a cold 2 to 3 times a year. Your caregiver can confirm this based on your exam. Most importantly, your caregiver can check that your symptoms are not due to another disease such as strep throat, sinusitis, pneumonia, asthma, or epiglottitis. Blood tests, throat tests, and X-rays are not necessary to diagnose a common cold, but they may sometimes be helpful in excluding other more serious diseases. Your caregiver will decide if any further tests are required. RISKS AND COMPLICATIONS  You may be at risk for a more severe case of the common cold if you smoke cigarettes, have chronic heart disease (such as heart failure) or lung disease (such as asthma), or if  you have a weakened immune system. The very young and very old are also at risk for more serious infections. Bacterial sinusitis, middle ear infections, and bacterial pneumonia can complicate the common cold. The common cold can worsen asthma and chronic obstructive pulmonary disease (COPD). Sometimes, these complications can require emergency medical care and may be life-threatening. PREVENTION  The best way to protect against getting a cold is to practice good hygiene. Avoid oral or hand contact with people with cold symptoms. Wash your hands often if contact occurs. There is no clear evidence that vitamin C, vitamin E, echinacea, or exercise reduces the chance of developing a cold. However, it is always recommended to get plenty of rest and practice good nutrition. TREATMENT  Treatment is directed at relieving symptoms. There is no cure. Antibiotics are not effective, because the infection is caused by a virus, not by bacteria. Treatment may include:  Increased fluid intake. Sports drinks offer valuable electrolytes, sugars, and fluids.  Breathing heated mist or steam (vaporizer or shower).  Eating chicken soup or other clear broths, and maintaining good nutrition.  Getting plenty of rest.  Using gargles or lozenges for comfort.  Controlling fevers with ibuprofen or acetaminophen as directed by your caregiver.  Increasing usage of your inhaler if you have asthma. Zinc gel and zinc lozenges, taken in the first 24 hours of the common cold, can shorten the duration and lessen the severity of symptoms. Pain medicines may help with fever, muscle aches, and throat pain. A variety of non-prescription medicines are available to treat congestion and runny nose. Your caregiver   can make recommendations and may suggest nasal or lung inhalers for other symptoms.  HOME CARE INSTRUCTIONS   Only take over-the-counter or prescription medicines for pain, discomfort, or fever as directed by your  caregiver.  Use a warm mist humidifier or inhale steam from a shower to increase air moisture. This may keep secretions moist and make it easier to breathe.  Drink enough water and fluids to keep your urine clear or pale yellow.  Rest as needed.  Return to work when your temperature has returned to normal or as your caregiver advises. You may need to stay home longer to avoid infecting others. You can also use a face mask and careful hand washing to prevent spread of the virus. SEEK MEDICAL CARE IF:   After the first few days, you feel you are getting worse rather than better.  You need your caregiver's advice about medicines to control symptoms.  You develop chills, worsening shortness of breath, or brown or red sputum. These may be signs of pneumonia.  You develop yellow or brown nasal discharge or pain in the face, especially when you bend forward. These may be signs of sinusitis.  You develop a fever, swollen neck glands, pain with swallowing, or white areas in the back of your throat. These may be signs of strep throat. SEEK IMMEDIATE MEDICAL CARE IF:   You have a fever.  You develop severe or persistent headache, ear pain, sinus pain, or chest pain.  You develop wheezing, a prolonged cough, cough up blood, or have a change in your usual mucus (if you have chronic lung disease).  You develop sore muscles or a stiff neck. Document Released: 09/03/2000 Document Revised: 06/02/2011 Document Reviewed: 06/15/2013 [employer] Patient Information 2015 [employer]. This information is not intended to replace advice given to you by your health care provider. Make sure you discuss any questions you have with your health care provider.  

## 2014-05-05 NOTE — ED Provider Notes (Signed)
CSN: [id number]     Arrival date & time 05/05/14  0201 History   First MD Initiated Contact with Patient 05/05/14 [phone]     Chief Complaint  Patient presents with  . Shortness of Breath  . Sore Throat      HPI Patient ports nasal congestion and cough over the past 48 hours.  No significant shortness of breath.  He feels like he has mild sore throat as well.  He feels that he has some postnasal drip.  Think he would benefit from azithromycin.  He denies fevers and chills.  No recent sick contacts.  Denies abdominal pain.  No nausea vomiting or diarrhea.  His symptoms are mild in severity.  Nothing worsens or improves his symptoms.  He does have a history of asthma and states he ran out of his albuterol.  He denies significant shortness of breath at this time but reports he has been coughing over the past 4 days.   Past Medical History  Diagnosis Date  . Hypertension   . Gout    Past Surgical History  Procedure Laterality Date  . Hernia repair     Family History  Problem Relation Age of Onset  . Diabetes Father   . Hypertension Other   . Diabetes Other    History  Substance Use Topics  . Smoking status: Never Smoker   . Smokeless tobacco: Not on file  . Alcohol Use: No    Review of Systems  All other systems reviewed and are negative.     Allergies  Review of patient's allergies indicates no known allergies.  Home Medications   Prior to Admission medications   Medication Sig Start Date End Date Taking? Authorizing Provider  albuterol (PROVENTIL HFA;VENTOLIN HFA) 108 (90 BASE) MCG/ACT inhaler Inhale 2 puffs into the lungs every 6 (six) hours as needed for shortness of breath.    Yes Historical Provider, MD  amLODipine (NORVASC) 10 MG tablet Take 10 mg by mouth every morning.    Yes Historical Provider, MD  cloNIDine (CATAPRES) 0.1 MG tablet Take 0.1 mg by mouth every morning.    Yes Historical Provider, MD  naproxen sodium (ANAPROX) 220 MG tablet Take 220 mg by mouth 2  (two) times daily as needed (pain).   Yes Historical Provider, MD   BP 155/65 mmHg  Pulse 88  Temp(Src) 99.4 F (37.4 C) (Oral)  Resp 18  SpO2 93% Physical Exam  Constitutional: He is oriented to person, place, and time. He appears well-developed and well-nourished.  HENT:  Head: Normocephalic and atraumatic.  Posterior pharynx is normal.  Uvula is midline.  Eyes: EOM are normal.  Neck: Normal range of motion.  Cardiovascular: Normal rate, regular rhythm, normal heart sounds and intact distal pulses.   Pulmonary/Chest: Effort normal and breath sounds normal. No respiratory distress.  Abdominal: Soft. He exhibits no distension. There is no tenderness.  Musculoskeletal: Normal range of motion.  Neurological: He is alert and oriented to person, place, and time.  Skin: Skin is warm and dry.  Psychiatric: He has a normal mood and affect. Judgment normal.  Nursing note and vitals reviewed.   ED Course  Procedures (including critical care time) Labs Review Labs Reviewed - No data to display  Imaging Review Dg Chest 2 View  05/05/2014   CLINICAL DATA:  Cold for a week. Cough for 3 days. Sore throat this morning. Shortness of breath.  EXAM: CHEST  2 VIEW  COMPARISON:  05/14/2010  FINDINGS: Shallow inspiration. Normal  heart size and pulmonary vascularity. No focal airspace disease or consolidation in the lungs. No blunting of costophrenic angles. No pneumothorax. Mediastinal contours appear intact.  IMPRESSION: No active cardiopulmonary disease.   Electronically Signed   By: Lucienne Capers M.D.   On: 05/05/2014 02:55     EKG Interpretation   Date/Time:  Friday May 05 2014 02:25:03 EST Ventricular Rate:  81 PR Interval:  151 QRS Duration: 88 QT Interval:  392 QTC Calculation: 455 R Axis:   -25 Text Interpretation:  Sinus rhythm Borderline left axis deviation Abnormal  R-wave progression, late transition Abnormal T, consider ischemia, lateral  leads No significant change was  found Confirmed by Laquinton Bihm  MD, Tristan Proto  ([id number]) on 05/05/2014 3:26:45 AM      MDM   Final diagnoses:  Upper respiratory tract infection    Suspect viral upper respiratory tract infection.  Well appearing.  Vital signs are normal.  Chest x-ray clear.  Discharge home in good condition.    Hoy Morn, MD 05/05/14 [phone]

## 2014-05-06 ENCOUNTER — Inpatient Hospital Stay (HOSPITAL_COMMUNITY)
Admission: EM | Admit: 2014-05-06 | Discharge: 2014-05-09 | DRG: 193 | Disposition: A | Discharge status: Home / Self Care | Payer: Managed Care, Other (non HMO) | Attending: Internal Medicine | Admitting: Internal Medicine

## 2014-05-06 ENCOUNTER — Encounter (HOSPITAL_COMMUNITY): Payer: Self-pay | Admitting: *Deleted

## 2014-05-06 ENCOUNTER — Emergency Department (HOSPITAL_COMMUNITY): Payer: Managed Care, Other (non HMO)

## 2014-05-06 DIAGNOSIS — Z6841 Body Mass Index (BMI) 40.0 and over, adult: Secondary | ICD-10-CM | POA: Diagnosis not present

## 2014-05-06 DIAGNOSIS — J45901 Unspecified asthma with (acute) exacerbation: Secondary | ICD-10-CM | POA: Diagnosis present

## 2014-05-06 DIAGNOSIS — E662 Morbid (severe) obesity with alveolar hypoventilation: Secondary | ICD-10-CM | POA: Diagnosis present

## 2014-05-06 DIAGNOSIS — N182 Chronic kidney disease, stage 2 (mild): Secondary | ICD-10-CM | POA: Diagnosis present

## 2014-05-06 DIAGNOSIS — J40 Bronchitis, not specified as acute or chronic: Secondary | ICD-10-CM | POA: Diagnosis present

## 2014-05-06 DIAGNOSIS — N184 Chronic kidney disease, stage 4 (severe): Secondary | ICD-10-CM | POA: Diagnosis present

## 2014-05-06 DIAGNOSIS — E876 Hypokalemia: Secondary | ICD-10-CM | POA: Diagnosis present

## 2014-05-06 DIAGNOSIS — Z8249 Family history of ischemic heart disease and other diseases of the circulatory system: Secondary | ICD-10-CM | POA: Diagnosis not present

## 2014-05-06 DIAGNOSIS — I129 Hypertensive chronic kidney disease with stage 1 through stage 4 chronic kidney disease, or unspecified chronic kidney disease: Secondary | ICD-10-CM | POA: Diagnosis present

## 2014-05-06 DIAGNOSIS — R0602 Shortness of breath: Secondary | ICD-10-CM | POA: Diagnosis present

## 2014-05-06 DIAGNOSIS — J189 Pneumonia, unspecified organism: Principal | ICD-10-CM | POA: Diagnosis present

## 2014-05-06 DIAGNOSIS — Z833 Family history of diabetes mellitus: Secondary | ICD-10-CM | POA: Diagnosis not present

## 2014-05-06 DIAGNOSIS — J9621 Acute and chronic respiratory failure with hypoxia: Secondary | ICD-10-CM | POA: Diagnosis present

## 2014-05-06 DIAGNOSIS — Z79899 Other long term (current) drug therapy: Secondary | ICD-10-CM | POA: Diagnosis not present

## 2014-05-06 DIAGNOSIS — J4530 Mild persistent asthma, uncomplicated: Secondary | ICD-10-CM | POA: Diagnosis present

## 2014-05-06 DIAGNOSIS — G4733 Obstructive sleep apnea (adult) (pediatric): Secondary | ICD-10-CM | POA: Diagnosis present

## 2014-05-06 DIAGNOSIS — R739 Hyperglycemia, unspecified: Secondary | ICD-10-CM | POA: Diagnosis not present

## 2014-05-06 DIAGNOSIS — R0902 Hypoxemia: Secondary | ICD-10-CM | POA: Diagnosis present

## 2014-05-06 DIAGNOSIS — Z9989 Dependence on other enabling machines and devices: Secondary | ICD-10-CM

## 2014-05-06 HISTORY — DX: Morbid (severe) obesity due to excess calories: E66.01

## 2014-05-06 HISTORY — DX: Obstructive sleep apnea (adult) (pediatric): Z99.89

## 2014-05-06 HISTORY — DX: Obstructive sleep apnea (adult) (pediatric): G47.33

## 2014-05-06 LAB — BRAIN NATRIURETIC PEPTIDE: B Natriuretic Peptide: 125.6 pg/mL — ABNORMAL HIGH (ref 0.0–100.0)

## 2014-05-06 LAB — COMPREHENSIVE METABOLIC PANEL
ALT: 21 U/L (ref 0–53)
AST: 32 U/L (ref 0–37)
Albumin: 3.9 g/dL (ref 3.5–5.2)
Alkaline Phosphatase: 85 U/L (ref 39–117)
Anion gap: 9 (ref 5–15)
BUN: 19 mg/dL (ref 6–23)
CALCIUM: 8.9 mg/dL (ref 8.4–10.5)
CO2: 30 mmol/L (ref 19–32)
CREATININE: 1.74 mg/dL — AB (ref 0.50–1.35)
Chloride: 99 mmol/L (ref 96–112)
GFR calc Af Amer: 51 mL/min — ABNORMAL LOW (ref >90)
GFR, EST NON AFRICAN AMERICAN: 44 mL/min — AB (ref >90)
Glucose, Bld: 209 mg/dL — ABNORMAL HIGH (ref 70–99)
POTASSIUM: 3.1 mmol/L — AB (ref 3.5–5.1)
Sodium: 138 mmol/L (ref 135–145)
Total Bilirubin: 0.6 mg/dL (ref 0.3–1.2)
Total Protein: 7.4 g/dL (ref 6.0–8.3)

## 2014-05-06 LAB — CBC WITH DIFFERENTIAL/PLATELET
BASOS ABS: 0 10*3/uL (ref 0.0–0.1)
Basophils Relative: 0 % (ref 0–1)
Eosinophils Absolute: 0 10*3/uL (ref 0.0–0.7)
Eosinophils Relative: 1 % (ref 0–5)
HEMATOCRIT: 42.6 % (ref 39.0–52.0)
Hemoglobin: 13.3 g/dL (ref 13.0–17.0)
Lymphocytes Relative: 18 % (ref 12–46)
Lymphs Abs: 1.5 10*3/uL (ref 0.7–4.0)
MCH: 27.1 pg (ref 26.0–34.0)
MCHC: 31.2 g/dL (ref 30.0–36.0)
MCV: 86.8 fL (ref 78.0–100.0)
MONO ABS: 0.7 10*3/uL (ref 0.1–1.0)
MONOS PCT: 8 % (ref 3–12)
NEUTROS PCT: 73 % (ref 43–77)
Neutro Abs: 5.9 10*3/uL (ref 1.7–7.7)
PLATELETS: 163 10*3/uL (ref 150–400)
RBC: 4.91 MIL/uL (ref 4.22–5.81)
RDW: 13.5 % (ref 11.5–15.5)
WBC: 8.1 10*3/uL (ref 4.0–10.5)

## 2014-05-06 LAB — I-STAT TROPONIN, ED: Troponin i, poc: 0.02 ng/mL (ref 0.00–0.08)

## 2014-05-06 MED ORDER — IOHEXOL 350 MG/ML SOLN
100.0000 mL | Freq: Once | INTRAVENOUS | Status: AC | PRN
  Administered 2014-05-06: 125 mL via INTRAVENOUS

## 2014-05-06 MED ORDER — IPRATROPIUM-ALBUTEROL 0.5-2.5 (3) MG/3ML IN SOLN
3.0000 mL | Freq: Once | RESPIRATORY_TRACT | Status: AC
  Administered 2014-05-06: 3 mL via RESPIRATORY_TRACT
  Filled 2014-05-06: qty 3

## 2014-05-06 MED ORDER — LEVOFLOXACIN IN D5W 750 MG/150ML IV SOLN
750.0000 mg | Freq: Once | INTRAVENOUS | Status: AC
  Administered 2014-05-07: 750 mg via INTRAVENOUS
  Filled 2014-05-06: qty 150

## 2014-05-06 MED ORDER — SODIUM CHLORIDE 0.9 % IV BOLUS (SEPSIS)
1000.0000 mL | Freq: Once | INTRAVENOUS | Status: AC
  Administered 2014-05-06: 1000 mL via INTRAVENOUS

## 2014-05-06 MED ORDER — METHYLPREDNISOLONE SODIUM SUCC 125 MG IJ SOLR
125.0000 mg | Freq: Once | INTRAMUSCULAR | Status: AC
  Administered 2014-05-06: 125 mg via INTRAVENOUS
  Filled 2014-05-06: qty 2

## 2014-05-06 NOTE — ED Provider Notes (Addendum)
CSN: [id number]     Arrival date & time 05/06/14  2036 History   First MD Initiated Contact with Patient 05/06/14 2053     Chief Complaint  Patient presents with  . URI  . Shortness of Breath     (Consider location/radiation/quality/duration/timing/severity/associated sxs/prior Treatment) The history is provided by the patient.  Phillip [name] is a 54 y.o. male hx of HTN, gout here with shortness of breath, congestion. He has been having shortness of breath and congestion for the last 4 days. Worse when he exerts himself. He has some productive cough with brownish sputum as well. Came in yesterday and had a normal chest x-ray so was sent home with albuterol. However he states that his symptoms has not been better even with the albuterol. Had fever 101 at home today. Wear bipap at night and woke up yesterday with shortness of breath.    Past Medical History  Diagnosis Date  . Hypertension   . Gout   . OSA on CPAP   . Morbid obesity    Past Surgical History  Procedure Laterality Date  . Hernia repair     Family History  Problem Relation Age of Onset  . Diabetes Father   . CAD Father   . Hypertension Other   . Diabetes Other    History  Substance Use Topics  . Smoking status: Never Smoker   . Smokeless tobacco: Not on file  . Alcohol Use: No    Review of Systems  Respiratory: Positive for shortness of breath.   All other systems reviewed and are negative.     Allergies  Review of patient's allergies indicates no known allergies.  Home Medications   Prior to Admission medications   Medication Sig Start Date End Date Taking? Authorizing Provider  albuterol (PROVENTIL HFA;VENTOLIN HFA) 108 (90 BASE) MCG/ACT inhaler Inhale 2 puffs into the lungs every 6 (six) hours as needed for shortness of breath.    Yes Historical Provider, MD  amLODipine (NORVASC) 10 MG tablet Take 10 mg by mouth every morning.    Yes Historical Provider, MD  cloNIDine (CATAPRES) 0.1 MG tablet Take  0.1 mg by mouth every morning.    Yes Historical Provider, MD  naproxen sodium (ANAPROX) 220 MG tablet Take 220 mg by mouth 2 (two) times daily as needed (pain).   Yes Historical Provider, MD   BP 159/89 mmHg  Pulse 115  Temp(Src) 99 F (37.2 C) (Oral)  Resp 22  SpO2 96% Physical Exam  Constitutional: He is oriented to person, place, and time.  Obese, tachypneic   HENT:  Head: Normocephalic.  Mouth/Throat: Oropharynx is clear and moist.  Eyes: Conjunctivae and EOM are normal. Pupils are equal, round, and reactive to light.  Neck: Normal range of motion. Neck supple.  Cardiovascular: Regular rhythm and normal heart sounds.   Slightly tachy   Pulmonary/Chest:  Tachypneic, diffuse wheezing.   Abdominal: Soft. Bowel sounds are normal. He exhibits no distension. There is no tenderness. There is no rebound.  Musculoskeletal: Normal range of motion.  1+ edema bilaterally   Neurological: He is alert and oriented to person, place, and time. No cranial nerve deficit. Coordination normal.  Skin: Skin is warm and dry.  Psychiatric: He has a normal mood and affect. His behavior is normal. Judgment and thought content normal.  Nursing note and vitals reviewed.   ED Course  Procedures (including critical care time) Labs Review Labs Reviewed  COMPREHENSIVE METABOLIC PANEL - Abnormal; Notable for the following:  Potassium 3.1 (*)    Glucose, Bld 209 (*)    Creatinine, Ser 1.74 (*)    GFR calc non Af Amer 44 (*)    GFR calc Af Amer 51 (*)    All other components within normal limits  BRAIN NATRIURETIC PEPTIDE - Abnormal; Notable for the following:    B Natriuretic Peptide 125.6 (*)    All other components within normal limits  CBC WITH DIFFERENTIAL/PLATELET  Randolm Idol, ED    Imaging Review Dg Chest 2 View  05/06/2014   CLINICAL DATA:  Cough congestion and dyspnea for 4 days.  EXAM: CHEST  2 VIEW  COMPARISON:  05/05/2014  FINDINGS: The lungs are clear. There are no effusions.  Pulmonary vasculature is normal. Hilar, mediastinal and cardiac contours appear unremarkable.  No significant skeletal abnormalities are evident. There is no significant interval change.  IMPRESSION: No active cardiopulmonary disease.   Electronically Signed   By: Andreas Newport M.D.   On: 05/06/2014 21:29   Dg Chest 2 View  05/05/2014   CLINICAL DATA:  Cold for a week. Cough for 3 days. Sore throat this morning. Shortness of breath.  EXAM: CHEST  2 VIEW  COMPARISON:  05/14/2010  FINDINGS: Shallow inspiration. Normal heart size and pulmonary vascularity. No focal airspace disease or consolidation in the lungs. No blunting of costophrenic angles. No pneumothorax. Mediastinal contours appear intact.  IMPRESSION: No active cardiopulmonary disease.   Electronically Signed   By: Lucienne Capers M.D.   On: 05/05/2014 02:55   Ct Angio Chest Pe W/cm &/or Wo Cm  05/06/2014   CLINICAL DATA:  Acute onset of shortness of breath and congestion. Productive cough. Initial encounter.  EXAM: CT ANGIOGRAPHY CHEST WITH CONTRAST  TECHNIQUE: Multidetector CT imaging of the chest was performed using the standard protocol during bolus administration of intravenous contrast. Multiplanar CT image reconstructions and MIPs were obtained to evaluate the vascular anatomy.  CONTRAST:  122mL OMNIPAQUE IOHEXOL 350 MG/ML SOLN  COMPARISON:  CTA of the chest performed 08/10/2009, and chest radiograph performed earlier today at 9:20 p.m.  FINDINGS: There is no evidence of pulmonary embolus.  Mild patchy right basilar airspace opacities are concerning for pneumonia. There is no evidence of pleural effusion or pneumothorax. No masses are identified; no abnormal focal contrast enhancement is seen.  The mediastinum is unremarkable in appearance. No mediastinal lymphadenopathy is seen. No pericardial effusion is identified. The great vessels are grossly unremarkable in appearance. No axillary lymphadenopathy is seen. The visualized portions of  the thyroid gland are unremarkable in appearance.  The visualized portions of the liver and spleen are unremarkable.  No acute osseous abnormalities are seen.  Review of the MIP images confirms the above findings.  IMPRESSION: 1. No evidence of pulmonary embolus. 2. Mild patchy right lower lobe pneumonia noted.   Electronically Signed   By: Garald Balding M.D.   On: 05/06/2014 23:45     EKG Interpretation   Date/Time:  Saturday May 06 2014 21:14:39 EST Ventricular Rate:  101 PR Interval:  147 QRS Duration: 87 QT Interval:  347 QTC Calculation: 450 R Axis:   -30 Text Interpretation:  Sinus tachycardia Atrial premature complexes Left  axis deviation Abnormal T, consider ischemia, lateral leads Baseline  wander in lead(s) I V4 V6 No significant change since last tracing  Confirmed by YAO  MD, DAVID ([id number]) on 05/06/2014 10:37:06 PM      MDM   Final diagnoses:  Shortness of breath  Bronchitis   Phillip  [name] is a 54 y.o. male here with SOB, wheezing. I think its likely multifactorial from obesity hypoventilation syndrome and bronchitis, possible pneumonia. Also consider new onset CHF or PE. Will get labs, cxr, BNP. Will give nebs and steroids and reassess.   11 PM Patient desat to 86% with ambulation. CXR showed no pneumonia. BNP normal. Given IV steroids. Cr. 1.7. CT pending. Hospitalist will admit.   11:49 PM CT showed no PE, but has pneumonia. Given levaquin, will admit.     Wandra Arthurs, MD 05/06/14 [id number]  Wandra Arthurs, MD 05/06/14 [phone]

## 2014-05-06 NOTE — H&P (Signed)
PCP: Phillip Noble, MD    Chief Complaint: Orders of breath and cough productive of brown sputum  HPI: Phillip [name] is a 54 y.o. male   has a past medical history of Hypertension; Gout; OSA on CPAP; and Morbid obesity.   Presented with  Patient has history of morbid obesity, sleep apnea on CPAP machine and asthma. Patient has reported that he has ran out of albuterol. For past 4 days he develop worsening cough, shortness of breath and wheezing. Low-grade fever up to 100.1. Patient was seen in [place] along emergency department yesterday on the 12th was given prescription for albuterol that time chest x-ray was unremarkable. Today at 2 AM patient woke up because while being on the Cipro she and he coughed up large amount of mucus. He notices mucus was brown. Patient continued to have worsening shortness of breath especially walking and presented back to emergency department. Was found to be somewhat hypoxic down to 86% while ambulating heart rate was noted to be elevated 1:15. Patient afebrile while in emergency department although reporting home fevers. Chest x-ray again unremarkable. Patient reports chest pain only with coughing. Given hypoxia and shortness of breath with risk factors of morbid obesity CT scan with contrast of the chest was ordered to rule out PE showed no PEut evidence of CAP  Hospitalist was called for admission for  CAP in the setting of asthma  Review of Systems:    Pertinent positives include: productive cough, Fevers, chills, wheezing. shortness of breath at rest.  dyspnea on exertion,   Constitutional:  No weight loss, night sweats, fatigue, weight loss  HEENT:  No headaches, Difficulty swallowing,Tooth/dental problems,Sore throat,  No sneezing, itching, ear ache, nasal congestion, post nasal drip,  Cardio-vascular:  No chest pain, Orthopnea, PND, anasarca, dizziness, palpitations.no Bilateral lower extremity swelling  GI:  No heartburn, indigestion, abdominal  pain, nausea, vomiting, diarrhea, change in bowel habits, loss of appetite, melena, blood in stool, hematemesis Resp:  no No excess mucus, no  No non-productive cough, No coughing up of blood.No change in color of mucus.No  Skin:  no rash or lesions. No jaundice GU:  no dysuria, change in color of urine, no urgency or frequency. No straining to urinate.  No flank pain.  Musculoskeletal:  No joint pain or no joint swelling. No decreased range of motion. No back pain.  Psych:  No change in mood or affect. No depression or anxiety. No memory loss.  Neuro: no localizing neurological complaints, no tingling, no weakness, no double vision, no gait abnormality, no slurred speech, no confusion  Otherwise ROS are negative except for above, 10 systems were reviewed  Past Medical History: Past Medical History  Diagnosis Date  . Hypertension   . Gout   . OSA on CPAP   . Morbid obesity    Past Surgical History  Procedure Laterality Date  . Hernia repair       Medications: Prior to Admission medications   Medication Sig Start Date End Date Taking? Authorizing Provider  albuterol (PROVENTIL HFA;VENTOLIN HFA) 108 (90 BASE) MCG/ACT inhaler Inhale 2 puffs into the lungs every 6 (six) hours as needed for shortness of breath.    Yes Historical Provider, MD  amLODipine (NORVASC) 10 MG tablet Take 10 mg by mouth every morning.    Yes Historical Provider, MD  cloNIDine (CATAPRES) 0.1 MG tablet Take 0.1 mg by mouth every morning.    Yes Historical Provider, MD  naproxen sodium (ANAPROX) 220 MG tablet  Take 220 mg by mouth 2 (two) times daily as needed (pain).   Yes Historical Provider, MD    Allergies:  No Known Allergies  Social History:  Ambulatory   Independently        reports that he has never smoked. He does not have any smokeless tobacco history on file. He reports that he does not drink alcohol or use illicit drugs.    Family History: family history includes CAD in his father;  Diabetes in his father and other; Hypertension in his other.    Physical Exam: Patient Vitals for the past 24 hrs:  BP Temp Temp src Pulse Resp SpO2  05/06/14 2042 159/89 mmHg 99 F (37.2 C) Oral 115 22 96 %    1. General:  in No Acute distress 2. Psychological: Alert and  Oriented 3. Head/ENT:   Moist Mucous Membranes                          Head Non traumatic, neck supple                          Normal   Dentition 4. SKIN: normal  Skin turgor,  Skin clean Dry and intact no rash 5. Heart: rapid but Regular rate and rhythm no Murmur, Rub or gallop 6. Lungs: Clear to auscultation bilaterally, some wheezes no crackles   7. Abdomen: Soft, non-tender, Non distended, obese 8. Lower extremities: no clubbing, cyanosis, or edema 9. Neurologically Grossly intact, moving all 4 extremities equally 10. MSK: Normal range of motion  body mass index is unknown because there is no weight on file.   Labs on Admission:   Results for orders placed or performed during the hospital encounter of 05/06/14 (from the past 24 hour(s))  CBC with Differential     Status: None   Collection Time: 05/06/14  9:28 PM  Result Value Ref Range   WBC 8.1 4.0 - 10.5 K/uL   RBC 4.91 4.22 - 5.81 MIL/uL   Hemoglobin 13.3 13.0 - 17.0 g/dL   HCT 42.6 39.0 - 52.0 %   MCV 86.8 78.0 - 100.0 fL   MCH 27.1 26.0 - 34.0 pg   MCHC 31.2 30.0 - 36.0 g/dL   RDW 13.5 11.5 - 15.5 %   Platelets 163 150 - 400 K/uL   Neutrophils Relative % 73 43 - 77 %   Neutro Abs 5.9 1.7 - 7.7 K/uL   Lymphocytes Relative 18 12 - 46 %   Lymphs Abs 1.5 0.7 - 4.0 K/uL   Monocytes Relative 8 3 - 12 %   Monocytes Absolute 0.7 0.1 - 1.0 K/uL   Eosinophils Relative 1 0 - 5 %   Eosinophils Absolute 0.0 0.0 - 0.7 K/uL   Basophils Relative 0 0 - 1 %   Basophils Absolute 0.0 0.0 - 0.1 K/uL  Comprehensive metabolic panel     Status: Abnormal   Collection Time: 05/06/14  9:28 PM  Result Value Ref Range   Sodium 138 135 - 145 mmol/L   Potassium  3.1 (L) 3.5 - 5.1 mmol/L   Chloride 99 96 - 112 mmol/L   CO2 30 19 - 32 mmol/L   Glucose, Bld 209 (H) 70 - 99 mg/dL   BUN 19 6 - 23 mg/dL   Creatinine, Ser 1.74 (H) 0.50 - 1.35 mg/dL   Calcium 8.9 8.4 - 10.5 mg/dL   Total Protein 7.4 6.0 -  8.3 g/dL   Albumin 3.9 3.5 - 5.2 g/dL   AST 32 0 - 37 U/L   ALT 21 0 - 53 U/L   Alkaline Phosphatase 85 39 - 117 U/L   Total Bilirubin 0.6 0.3 - 1.2 mg/dL   GFR calc non Af Amer 44 (L) >90 mL/min   GFR calc Af Amer 51 (L) >90 mL/min   Anion gap 9 5 - 15  Brain natriuretic peptide     Status: Abnormal   Collection Time: 05/06/14  9:28 PM  Result Value Ref Range   B Natriuretic Peptide 125.6 (H) 0.0 - 100.0 pg/mL  I-stat troponin, ED     Status: None   Collection Time: 05/06/14  9:40 PM  Result Value Ref Range   Troponin i, poc 0.02 0.00 - 0.08 ng/mL   Comment 3            UA not obtained  Lab Results  Component Value Date   HGBA1C * 05/14/2010    6.5 (NOTE)                                                                       According to the ADA Clinical Practice Recommendations for 2011, when HbA1c is used as a screening test:   >=6.5%   Diagnostic of Diabetes Mellitus           (if abnormal result  is confirmed)  5.7-6.4%   Increased risk of developing Diabetes Mellitus  References:Diagnosis and Classification of Diabetes Mellitus,Diabetes [id number] 1):S62-S69 and Standards of Medical Care in         Diabetes - 2011,Diabetes Care,2011,34  (Suppl 1):S11-S61.    CrCl cannot be calculated (Unknown ideal weight.).  BNP (last 3 results) No results for input(s): PROBNP in the last 8760 hours.  Other results:  I have pearsonaly reviewed this: ECG REPORT  Rate: 101  Rhythm: ST w PAC ST&T Change: no ischemic changes   There were no vitals filed for this visit.   Cultures:    Component Value Date/Time   SDES URINE, CLEAN CATCH 05/15/2010 0036   SPECREQUEST IMMUNE:NORM UT SYMPT:NEG 05/15/2010 0036   CULT INSIGNIFICANT  GROWTH 05/15/2010 0036   REPTSTATUS 05/16/2010 FINAL 05/15/2010 0036     Radiological Exams on Admission: Dg Chest 2 View  05/06/2014   CLINICAL DATA:  Cough congestion and dyspnea for 4 days.  EXAM: CHEST  2 VIEW  COMPARISON:  05/05/2014  FINDINGS: The lungs are clear. There are no effusions. Pulmonary vasculature is normal. Hilar, mediastinal and cardiac contours appear unremarkable.  No significant skeletal abnormalities are evident. There is no significant interval change.  IMPRESSION: No active cardiopulmonary disease.   Electronically Signed   By: Andreas Newport M.D.   On: 05/06/2014 21:29   Dg Chest 2 View  05/05/2014   CLINICAL DATA:  Cold for a week. Cough for 3 days. Sore throat this morning. Shortness of breath.  EXAM: CHEST  2 VIEW  COMPARISON:  05/14/2010  FINDINGS: Shallow inspiration. Normal heart size and pulmonary vascularity. No focal airspace disease or consolidation in the lungs. No blunting of costophrenic angles. No pneumothorax. Mediastinal contours appear intact.  IMPRESSION: No active cardiopulmonary disease.   Electronically Signed   By: Gwyndolyn Saxon  Gerilyn Nestle M.D.   On: 05/05/2014 02:55    Chart has been reviewed  Assessment/Plan  54 year old gentleman with history of asthma presents with wheezing and cough productive of brown sputum and fevers CT showed CAP  Present on Admission:  . CAP (community acquired pneumonia) -  - will admit for treatment of CAP will start on appropriate antibiotic coverage.   Obtain sputum cultures, blood cultures if febrile or if decompensates.  Provide oxygen as needed.   . OSA on CPAP - continue CPAP while in hospital  . Hypokalemia - replace . CKD (chronic kidney disease) stage 2, GFR 60-89 ml/min - continue to monitor after IV contrast has been administered  . Asthma exacerbation - give albuterol and Atrovent nebulizer. Patient received Solu-Medrol while in emergency department. For now hold off on steroids given diagnosis of pneumonia        Prophylaxis:  Lovenox, Protonix  CODE STATUS:  FULL CODE     Other plan as per orders.  I have spent a total of 55 min on this admission  Phillip [name] 05/06/2014, 11:21 PM  [facility]  Pager [phone]   after 2 AM please page floor coverage PA If 7AM-7PM, please contact the day team taking care of the patient  [email or web address]  Password [email or web address]

## 2014-05-06 NOTE — ED Notes (Signed)
Pt states that he has had cough and congestion x 4 days; pt was seen last pm and diagnosed with URI; pt states that he feels more short of breath and more so with activity; pt able to speak in full sentences in triage; pt  takes a deep breath at times and states "I feel like I need to catch my breath"; pt also reports productive cough with brownish sputum

## 2014-05-07 DIAGNOSIS — J189 Pneumonia, unspecified organism: Principal | ICD-10-CM

## 2014-05-07 LAB — PHOSPHORUS: Phosphorus: 2.7 mg/dL (ref 2.3–4.6)

## 2014-05-07 LAB — COMPREHENSIVE METABOLIC PANEL
ALBUMIN: 3.9 g/dL (ref 3.5–5.2)
ALT: 21 U/L (ref 0–53)
AST: 31 U/L (ref 0–37)
Alkaline Phosphatase: 79 U/L (ref 39–117)
Anion gap: 11 (ref 5–15)
BUN: 18 mg/dL (ref 6–23)
CO2: 29 mmol/L (ref 19–32)
CREATININE: 1.51 mg/dL — AB (ref 0.50–1.35)
Calcium: 8.9 mg/dL (ref 8.4–10.5)
Chloride: 98 mmol/L (ref 96–112)
GFR calc non Af Amer: 52 mL/min — ABNORMAL LOW (ref >90)
GFR, EST AFRICAN AMERICAN: 60 mL/min — AB (ref >90)
Glucose, Bld: 187 mg/dL — ABNORMAL HIGH (ref 70–99)
Potassium: 3.3 mmol/L — ABNORMAL LOW (ref 3.5–5.1)
Sodium: 138 mmol/L (ref 135–145)
Total Bilirubin: 0.4 mg/dL (ref 0.3–1.2)
Total Protein: 7.6 g/dL (ref 6.0–8.3)

## 2014-05-07 LAB — EXPECTORATED SPUTUM ASSESSMENT W REFEX TO RESP CULTURE: SPECIAL REQUESTS: NORMAL

## 2014-05-07 LAB — GLUCOSE, CAPILLARY: Glucose-Capillary: 192 mg/dL — ABNORMAL HIGH (ref 70–99)

## 2014-05-07 LAB — TSH: TSH: 1.724 u[IU]/mL (ref 0.350–4.500)

## 2014-05-07 LAB — STREP PNEUMONIAE URINARY ANTIGEN: STREP PNEUMO URINARY ANTIGEN: NEGATIVE

## 2014-05-07 LAB — MAGNESIUM: Magnesium: 1.8 mg/dL (ref 1.5–2.5)

## 2014-05-07 LAB — EXPECTORATED SPUTUM ASSESSMENT W GRAM STAIN, RFLX TO RESP C

## 2014-05-07 MED ORDER — POTASSIUM CHLORIDE CRYS ER 20 MEQ PO TBCR
20.0000 meq | EXTENDED_RELEASE_TABLET | Freq: Once | ORAL | Status: AC
  Administered 2014-05-07: 20 meq via ORAL
  Filled 2014-05-07: qty 1

## 2014-05-07 MED ORDER — CEFTRIAXONE SODIUM IN DEXTROSE 20 MG/ML IV SOLN
1.0000 g | INTRAVENOUS | Status: DC
  Administered 2014-05-07 – 2014-05-09 (×3): 1 g via INTRAVENOUS
  Filled 2014-05-07 (×3): qty 50

## 2014-05-07 MED ORDER — CLONIDINE HCL 0.1 MG PO TABS
0.1000 mg | ORAL_TABLET | Freq: Every morning | ORAL | Status: DC
  Administered 2014-05-07 – 2014-05-09 (×3): 0.1 mg via ORAL
  Filled 2014-05-07 (×3): qty 1

## 2014-05-07 MED ORDER — POTASSIUM CHLORIDE CRYS ER 20 MEQ PO TBCR
40.0000 meq | EXTENDED_RELEASE_TABLET | Freq: Once | ORAL | Status: AC
  Administered 2014-05-07: 40 meq via ORAL
  Filled 2014-05-07: qty 2

## 2014-05-07 MED ORDER — ALBUTEROL SULFATE (2.5 MG/3ML) 0.083% IN NEBU
2.5000 mg | INHALATION_SOLUTION | RESPIRATORY_TRACT | Status: DC | PRN
  Administered 2014-05-08: 2.5 mg via RESPIRATORY_TRACT
  Filled 2014-05-07: qty 3

## 2014-05-07 MED ORDER — SODIUM CHLORIDE 0.9 % IV SOLN
INTRAVENOUS | Status: DC
  Administered 2014-05-07: 02:00:00 via INTRAVENOUS

## 2014-05-07 MED ORDER — IPRATROPIUM BROMIDE 0.02 % IN SOLN
0.5000 mg | Freq: Four times a day (QID) | RESPIRATORY_TRACT | Status: DC
  Administered 2014-05-07: 0.5 mg via RESPIRATORY_TRACT
  Filled 2014-05-07: qty 2.5

## 2014-05-07 MED ORDER — IPRATROPIUM-ALBUTEROL 0.5-2.5 (3) MG/3ML IN SOLN
3.0000 mL | Freq: Two times a day (BID) | RESPIRATORY_TRACT | Status: DC
  Administered 2014-05-07 – 2014-05-09 (×4): 3 mL via RESPIRATORY_TRACT
  Filled 2014-05-07 (×4): qty 3

## 2014-05-07 MED ORDER — ENOXAPARIN SODIUM 40 MG/0.4ML ~~LOC~~ SOLN
40.0000 mg | SUBCUTANEOUS | Status: DC
  Administered 2014-05-07 – 2014-05-09 (×3): 40 mg via SUBCUTANEOUS
  Filled 2014-05-07 (×3): qty 0.4

## 2014-05-07 MED ORDER — AMLODIPINE BESYLATE 10 MG PO TABS
10.0000 mg | ORAL_TABLET | Freq: Every morning | ORAL | Status: DC
  Administered 2014-05-07 – 2014-05-09 (×3): 10 mg via ORAL
  Filled 2014-05-07 (×3): qty 1

## 2014-05-07 MED ORDER — DEXTROSE 5 % IV SOLN
500.0000 mg | INTRAVENOUS | Status: DC
  Administered 2014-05-07 – 2014-05-09 (×3): 500 mg via INTRAVENOUS
  Filled 2014-05-07 (×3): qty 500

## 2014-05-07 NOTE — ED Notes (Signed)
Patient transported to CT 

## 2014-05-07 NOTE — Progress Notes (Addendum)
PROGRESS NOTE    Phillip [name] [id number] DOB: 08-18-1960 DOA: 05/06/2014 PCP: [name], MD  HPI/Brief narrative 54 year old male patient with history of morbid obesity, OSA on nightly C Pap, asthma, presented with 4 days history of worsening cough, dyspnea, wheezing, low-grade fever. Recently seen in ED and prescribed albuterol. Return with worsening symptoms. In the ED, found to be hypoxic at 86%, tachycardic 115/m. Repeat chest x-ray unremarkable. CTA chest negative for PE but showed right lower lobe CAP.   Assessment/Plan:  1. Right lower lobe community acquired pneumonia: Continue IV Rocephin and azithromycin. 2. Acute hypoxic respiratory failure: Secondary to pneumonia complicating underlying OSA and asthma. Improved. 3. OSA: Continue nightly CPAP. 4. Asthma exacerbation: Had bronchospasm on admission which has since improved. No wheezing. Received a dose of Solu-Medrol in ED. Continue oxygen, bronchodilators and antibiotics. 5. Morbid obesity: 6. Hypokalemia: Replace and follow BMP 7. Essential hypertension: Controlled. Continue clonidine and amlodipine. 8. Stage II chronic kidney disease: Creatinine probably at baseline. 9. Hyperglycemia: Possibly from Solu-Medrol. Check CBGs and A1c. SSI as needed.   Code Status: Full Family Communication: None at bedside Disposition Plan: DC home possibly 2/15   Consultants:  None  Procedures:  None  Antibiotics:  IV Rocephin 2/13>  IV Azithromycin 2/13>   Subjective: Patient states that he feels a whole lot better. Denies dyspnea. Still has mild productive cough. No wheezing or chest pain.  Objective: Filed Vitals:   05/07/14 0524 05/07/14 0526 05/07/14 0533 05/07/14 0804  BP:   149/80   Pulse:   101   Temp:   98.9 F (37.2 C)   TempSrc:   Oral   Resp:   22   Height:      Weight:      SpO2: 80% 96% 97% 92%    Intake/Output Summary (Last 24 hours) at 05/07/14 1322 Last data filed at 05/07/14 0645  Gross  per 24 hour  Intake 1166.67 ml  Output    725 ml  Net 441.67 ml   Filed Weights   05/07/14 0130  Weight: 181.892 kg (401 lb)     Exam:  General exam: Moderately built and morbidly obese male sitting up comfortably in bed. Respiratory system: Occasional posterior rhonchi but otherwise clear to auscultation. No increased work of breathing. Cardiovascular system: S1 & S2 heard, RRR. No JVD, murmurs, gallops, clicks or pedal edema. Gastrointestinal system: Abdomen is nondistended, soft and nontender. Normal bowel sounds heard. Central nervous system: Alert and oriented. No focal neurological deficits. Extremities: Symmetric 5 x 5 power.   Data Reviewed: Basic Metabolic Panel:  Recent Labs Lab 05/06/14 2128 05/07/14 0444  NA 138 138  K 3.1* 3.3*  CL 99 98  CO2 30 29  GLUCOSE 209* 187*  BUN 19 18  CREATININE 1.74* 1.51*  CALCIUM 8.9 8.9  MG  --  1.8  PHOS  --  2.7   Liver Function Tests:  Recent Labs Lab 05/06/14 2128 05/07/14 0444  AST 32 31  ALT 21 21  ALKPHOS 85 79  BILITOT 0.6 0.4  PROT 7.4 7.6  ALBUMIN 3.9 3.9   No results for input(s): LIPASE, AMYLASE in the last 168 hours. No results for input(s): AMMONIA in the last 168 hours. CBC:  Recent Labs Lab 05/06/14 2128  WBC 8.1  NEUTROABS 5.9  HGB 13.3  HCT 42.6  MCV 86.8  PLT 163   Cardiac Enzymes: No results for input(s): CKTOTAL, CKMB, CKMBINDEX, TROPONINI in the last 168 hours. BNP (last  3 results) No results for input(s): PROBNP in the last 8760 hours. CBG: No results for input(s): GLUCAP in the last 168 hours.  Recent Results (from the past 240 hour(s))  Culture, sputum-assessment     Status: None   Collection Time: 05/07/14  2:49 AM  Result Value Ref Range Status   Specimen Description SPUTUM  Final   Special Requests Normal  Final   Sputum evaluation   Final    THIS SPECIMEN IS ACCEPTABLE. RESPIRATORY CULTURE REPORT TO FOLLOW.   Report Status 05/07/2014 FINAL  Final          Studies: Dg Chest 2 View  05/06/2014   CLINICAL DATA:  Cough congestion and dyspnea for 4 days.  EXAM: CHEST  2 VIEW  COMPARISON:  05/05/2014  FINDINGS: The lungs are clear. There are no effusions. Pulmonary vasculature is normal. Hilar, mediastinal and cardiac contours appear unremarkable.  No significant skeletal abnormalities are evident. There is no significant interval change.  IMPRESSION: No active cardiopulmonary disease.   Electronically Signed   By: Andreas Newport M.D.   On: 05/06/2014 21:29   Ct Angio Chest Pe W/cm &/or Wo Cm  05/06/2014   CLINICAL DATA:  Acute onset of shortness of breath and congestion. Productive cough. Initial encounter.  EXAM: CT ANGIOGRAPHY CHEST WITH CONTRAST  TECHNIQUE: Multidetector CT imaging of the chest was performed using the standard protocol during bolus administration of intravenous contrast. Multiplanar CT image reconstructions and MIPs were obtained to evaluate the vascular anatomy.  CONTRAST:  156mL OMNIPAQUE IOHEXOL 350 MG/ML SOLN  COMPARISON:  CTA of the chest performed 08/10/2009, and chest radiograph performed earlier today at 9:20 p.m.  FINDINGS: There is no evidence of pulmonary embolus.  Mild patchy right basilar airspace opacities are concerning for pneumonia. There is no evidence of pleural effusion or pneumothorax. No masses are identified; no abnormal focal contrast enhancement is seen.  The mediastinum is unremarkable in appearance. No mediastinal lymphadenopathy is seen. No pericardial effusion is identified. The great vessels are grossly unremarkable in appearance. No axillary lymphadenopathy is seen. The visualized portions of the thyroid gland are unremarkable in appearance.  The visualized portions of the liver and spleen are unremarkable.  No acute osseous abnormalities are seen.  Review of the MIP images confirms the above findings.  IMPRESSION: 1. No evidence of pulmonary embolus. 2. Mild patchy right lower lobe pneumonia noted.    Electronically Signed   By: Garald Balding M.D.   On: 05/06/2014 23:45        Scheduled Meds: . sodium chloride   Intravenous STAT  . amLODipine  10 mg Oral q morning - 10a  . azithromycin  500 mg Intravenous Q24H  . cefTRIAXone (ROCEPHIN)  IV  1 g Intravenous Q24H  . cloNIDine  0.1 mg Oral q morning - 10a  . enoxaparin (LOVENOX) injection  40 mg Subcutaneous Q24H  . ipratropium-albuterol  3 mL Nebulization BID   Continuous Infusions:   Active Problems:   Bronchitis   OSA on CPAP   Hypoxia   Hypokalemia   CKD (chronic kidney disease) stage 2, GFR 60-89 ml/min   Asthma exacerbation   Pneumonia   CAP (community acquired pneumonia)    Time spent: 82 minutes    Felix Meras, MD, FACP, FHM. [facility] Pager [phone]  If 7PM-7AM, please contact night-coverage [email or web address] Password [email or web address] 05/07/2014, 1:22 PM    LOS: 1 day

## 2014-05-07 NOTE — Progress Notes (Signed)
Pt has stated that he doesn't want to use CPAP tonight.  Pt states that he just wants to sleep with O2.  Pt to notify RT if he changes his mind.  RT to monitor and assess as needed.

## 2014-05-08 DIAGNOSIS — J9601 Acute respiratory failure with hypoxia: Secondary | ICD-10-CM

## 2014-05-08 LAB — GLUCOSE, CAPILLARY
GLUCOSE-CAPILLARY: 162 mg/dL — AB (ref 70–99)
Glucose-Capillary: 108 mg/dL — ABNORMAL HIGH (ref 70–99)
Glucose-Capillary: 105 mg/dL — ABNORMAL HIGH (ref 70–99)

## 2014-05-08 LAB — BASIC METABOLIC PANEL
Anion gap: 4 — ABNORMAL LOW (ref 5–15)
BUN: 24 mg/dL — ABNORMAL HIGH (ref 6–23)
CO2: 32 mmol/L (ref 19–32)
CREATININE: 1.53 mg/dL — AB (ref 0.50–1.35)
Calcium: 8.6 mg/dL (ref 8.4–10.5)
Chloride: 101 mmol/L (ref 96–112)
GFR calc Af Amer: 59 mL/min — ABNORMAL LOW (ref >90)
GFR, EST NON AFRICAN AMERICAN: 51 mL/min — AB (ref >90)
GLUCOSE: 124 mg/dL — AB (ref 70–99)
Potassium: 3.7 mmol/L (ref 3.5–5.1)
Sodium: 137 mmol/L (ref 135–145)

## 2014-05-08 LAB — LEGIONELLA ANTIGEN, URINE

## 2014-05-08 LAB — HEMOGLOBIN A1C
Hgb A1c MFr Bld: 6.3 % — ABNORMAL HIGH (ref 4.8–5.6)
Mean Plasma Glucose: 134 mg/dL

## 2014-05-08 NOTE — Procedures (Signed)
Pt refuses cpap

## 2014-05-08 NOTE — Progress Notes (Signed)
PROGRESS NOTE    Phillip [name] [id number] DOB: 01-Jul-1960 DOA: 05/06/2014 PCP: [name], MD  HPI/Brief narrative 54 year old male patient with history of morbid obesity, OSA on nightly C Pap, asthma, presented with 4 days history of worsening cough, dyspnea, wheezing, low-grade fever. Recently seen in ED and prescribed albuterol. Return with worsening symptoms. In the ED, found to be hypoxic at 86%, tachycardic 115/m. Repeat chest x-ray unremarkable. CTA chest negative for PE but showed right lower lobe CAP.   Assessment/Plan:  1. Right lower lobe community acquired pneumonia: Continue IV Rocephin and azithromycin for additional day and then DC on PO Abx.. 2. Acute hypoxic respiratory failure: Secondary to pneumonia complicating underlying OSA and asthma. Still hypoxic on RA at rest. Check desat eval prior to DC. 3. OSA: Continue nightly CPAP. 4. Asthma exacerbation: Had bronchospasm on admission which has since improved. No significant wheezing. Received a dose of Solu-Medrol in ED. Continue oxygen, bronchodilators and antibiotics. 5. Morbid obesity: 6. Hypokalemia: Replaced 7. Essential hypertension: Controlled. Continue clonidine and amlodipine. 8. Stage II chronic kidney disease: Creatinine at baseline. 9. Hyperglycemia: Possibly from Solu-Medrol. A1c 6.3. SSI as needed.   Code Status: Full Family Communication: None at bedside Disposition Plan: DC home possibly 2/16   Consultants:  None  Procedures:  None  Antibiotics:  IV Rocephin 2/13>  IV Azithromycin 2/13>   Subjective: Some productive cough- improving.  Objective: Filed Vitals:   05/08/14 0512 05/08/14 0635 05/08/14 0947 05/08/14 1005  BP: 142/66  146/69   Pulse: 80   83  Temp: 98.2 F (36.8 C)     TempSrc: Oral     Resp: 20   20  Height:      Weight:      SpO2: 98% 99% 97% 97%    Intake/Output Summary (Last 24 hours) at 05/08/14 1228 Last data filed at 05/08/14 0512  Gross per 24 hour    Intake    840 ml  Output   1325 ml  Net   -485 ml   Filed Weights   05/07/14 0130  Weight: 181.892 kg (401 lb)     Exam:  General exam: Moderately built and morbidly obese male sitting up comfortably in bed. Respiratory system: Occasional posterior rhonchi but otherwise clear to auscultation. No increased work of breathing. Cardiovascular system: S1 & S2 heard, RRR. No JVD, murmurs, gallops, clicks or pedal edema. Gastrointestinal system: Abdomen is nondistended, soft and nontender. Normal bowel sounds heard. Central nervous system: Alert and oriented. No focal neurological deficits. Extremities: Symmetric 5 x 5 power.   Data Reviewed: Basic Metabolic Panel:  Recent Labs Lab 05/06/14 2128 05/07/14 0444 05/08/14 0450  NA 138 138 137  K 3.1* 3.3* 3.7  CL 99 98 101  CO2 30 29 32  GLUCOSE 209* 187* 124*  BUN 19 18 24*  CREATININE 1.74* 1.51* 1.53*  CALCIUM 8.9 8.9 8.6  MG  --  1.8  --   PHOS  --  2.7  --    Liver Function Tests:  Recent Labs Lab 05/06/14 2128 05/07/14 0444  AST 32 31  ALT 21 21  ALKPHOS 85 79  BILITOT 0.6 0.4  PROT 7.4 7.6  ALBUMIN 3.9 3.9   No results for input(s): LIPASE, AMYLASE in the last 168 hours. No results for input(s): AMMONIA in the last 168 hours. CBC:  Recent Labs Lab 05/06/14 2128  WBC 8.1  NEUTROABS 5.9  HGB 13.3  HCT 42.6  MCV 86.8  PLT 163  Cardiac Enzymes: No results for input(s): CKTOTAL, CKMB, CKMBINDEX, TROPONINI in the last 168 hours. BNP (last 3 results) No results for input(s): PROBNP in the last 8760 hours. CBG:  Recent Labs Lab 05/07/14 2022 05/08/14 0921 05/08/14 1131  GLUCAP 192* 162* 108*    Recent Results (from the past 240 hour(s))  Culture, sputum-assessment     Status: None   Collection Time: 05/07/14  2:49 AM  Result Value Ref Range Status   Specimen Description SPUTUM  Final   Special Requests Normal  Final   Sputum evaluation   Final    THIS SPECIMEN IS ACCEPTABLE. RESPIRATORY  CULTURE REPORT TO FOLLOW.   Report Status 05/07/2014 FINAL  Final  Culture, respiratory (NON-Expectorated)     Status: None (Preliminary result)   Collection Time: 05/07/14  2:49 AM  Result Value Ref Range Status   Specimen Description SPUTUM  Final   Special Requests NONE  Final   Gram Stain PENDING  Incomplete   Culture   Final    NORMAL OROPHARYNGEAL FLORA Performed at [facility]    Report Status PENDING  Incomplete         Studies: Dg Chest 2 View  05/06/2014   CLINICAL DATA:  Cough congestion and dyspnea for 4 days.  EXAM: CHEST  2 VIEW  COMPARISON:  05/05/2014  FINDINGS: The lungs are clear. There are no effusions. Pulmonary vasculature is normal. Hilar, mediastinal and cardiac contours appear unremarkable.  No significant skeletal abnormalities are evident. There is no significant interval change.  IMPRESSION: No active cardiopulmonary disease.   Electronically Signed   By: Andreas Newport M.D.   On: 05/06/2014 21:29   Ct Angio Chest Pe W/cm &/or Wo Cm  05/06/2014   CLINICAL DATA:  Acute onset of shortness of breath and congestion. Productive cough. Initial encounter.  EXAM: CT ANGIOGRAPHY CHEST WITH CONTRAST  TECHNIQUE: Multidetector CT imaging of the chest was performed using the standard protocol during bolus administration of intravenous contrast. Multiplanar CT image reconstructions and MIPs were obtained to evaluate the vascular anatomy.  CONTRAST:  159mL OMNIPAQUE IOHEXOL 350 MG/ML SOLN  COMPARISON:  CTA of the chest performed 08/10/2009, and chest radiograph performed earlier today at 9:20 p.m.  FINDINGS: There is no evidence of pulmonary embolus.  Mild patchy right basilar airspace opacities are concerning for pneumonia. There is no evidence of pleural effusion or pneumothorax. No masses are identified; no abnormal focal contrast enhancement is seen.  The mediastinum is unremarkable in appearance. No mediastinal lymphadenopathy is seen. No pericardial effusion is  identified. The great vessels are grossly unremarkable in appearance. No axillary lymphadenopathy is seen. The visualized portions of the thyroid gland are unremarkable in appearance.  The visualized portions of the liver and spleen are unremarkable.  No acute osseous abnormalities are seen.  Review of the MIP images confirms the above findings.  IMPRESSION: 1. No evidence of pulmonary embolus. 2. Mild patchy right lower lobe pneumonia noted.   Electronically Signed   By: Garald Balding M.D.   On: 05/06/2014 23:45        Scheduled Meds: . amLODipine  10 mg Oral q morning - 10a  . azithromycin  500 mg Intravenous Q24H  . cefTRIAXone (ROCEPHIN)  IV  1 g Intravenous Q24H  . cloNIDine  0.1 mg Oral q morning - 10a  . enoxaparin (LOVENOX) injection  40 mg Subcutaneous Q24H  . ipratropium-albuterol  3 mL Nebulization BID   Continuous Infusions:   Active Problems:  Bronchitis   OSA on CPAP   Hypoxia   Hypokalemia   CKD (chronic kidney disease) stage 2, GFR 60-89 ml/min   Asthma exacerbation   Pneumonia   CAP (community acquired pneumonia)    Time spent: 33 minutes    Neha Waight, MD, FACP, FHM. [facility] Pager [phone]  If 7PM-7AM, please contact night-coverage [email or web address] Password [email or web address] 05/08/2014, 12:28 PM    LOS: 2 days

## 2014-05-09 LAB — CULTURE, RESPIRATORY: Culture: NORMAL

## 2014-05-09 LAB — GLUCOSE, CAPILLARY
GLUCOSE-CAPILLARY: 106 mg/dL — AB (ref 70–99)
Glucose-Capillary: 111 mg/dL — ABNORMAL HIGH (ref 70–99)
Glucose-Capillary: 99 mg/dL (ref 70–99)

## 2014-05-09 LAB — CULTURE, RESPIRATORY W GRAM STAIN

## 2014-05-09 MED ORDER — LEVOFLOXACIN 750 MG PO TABS: 750.0000 mg | ORAL_TABLET | Freq: Every day | ORAL | Status: DC

## 2014-05-09 MED ORDER — LEVOFLOXACIN 750 MG PO TABS
750.0000 mg | ORAL_TABLET | Freq: Every day | ORAL | Status: DC
  Administered 2014-05-09: 750 mg via ORAL
  Filled 2014-05-09: qty 1

## 2014-05-09 MED ORDER — LEVOFLOXACIN 750 MG PO TABS
750.0000 mg | ORAL_TABLET | Freq: Every day | ORAL | Status: DC
  Filled 2014-05-09: qty 1

## 2014-05-09 MED ORDER — ALBUTEROL SULFATE HFA 108 (90 BASE) MCG/ACT IN AERS: 2.0000 | INHALATION_SPRAY | Freq: Four times a day (QID) | RESPIRATORY_TRACT | Status: DC | PRN

## 2014-05-09 NOTE — Care Management Note (Signed)
CARE MANAGEMENT NOTE 05/09/2014  Patient:  Phillip [name], Phillip [name]   Account Number:  [id number]  Date Initiated:  05/09/2014  Documentation initiated by:  Marney Doctor  Subjective/Objective Assessment:   54 yo admitted with Bronchitis     Action/Plan:   From home alone   Anticipated DC Date:  05/09/2014   Anticipated DC Plan:  [place]  CM consult      Choice offered to / List presented to:             Status of service:  Completed, signed off Medicare Important Message given?   (If response is "NO", the following Medicare IM given date fields will be blank) Date Medicare IM given:   Medicare IM given by:   Date Additional Medicare IM given:   Additional Medicare IM given by:    Discharge Disposition:    Per UR Regulation:  Reviewed for med. necessity/level of care/duration of stay  If discussed at [place] of Stay Meetings, dates discussed:    Comments:  05/09/14 Marney Doctor RN,BSN,NCM Was notified by RN at 1225 that pt now states he wants a portable 02 tank for home.  [facility] called and informed of pt need. Order faxed to [place] and I was informed that it could take up to 4 hours for the tank to get here. Staff RN alerted of this. CM will continue to follow.  Marney Doctor RN,BSN,NCM [phone] MD order for Home 02.  Met with pt at bedside.  Pt states he has home 02 and a cpap at home currently (through [place]).  Pt states that he does not have a portable tank and he does not want one. He states that he has not used his 02 machine in about 3 years.  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  Pt encouraged multiple times to allow this CM to contact [facility] to obtain a portable tank for him and pt refuses. Staff RN made aware.  No other CM needs noted at this time.

## 2014-05-09 NOTE — Progress Notes (Signed)
SATURATION QUALIFICATIONS: (This note is used to comply with regulatory documentation for home oxygen)  Patient Saturations on Room Air at Rest = 94%  Patient Saturations on Room Air while Ambulating = 87%  Patient Saturations on 2Liters of oxygen while Ambulating = 99%  Please briefly explain why patient needs home oxygen:

## 2014-05-09 NOTE — Discharge Summary (Signed)
Physician Discharge Summary  Phillip [name] [id number] DOB: 1961/03/07 DOA: 05/06/2014  PCP: Phillip Noble, MD  Admit date: 05/06/2014 Discharge date: 05/09/2014  Time spent: Less than 30 minutes  Recommendations for Outpatient Follow-up:  1. Dr. Nicky Pugh, PCP in 5 days with repeat labs (CBC & BMP). 2. Recommend follow-up chest x-ray in 4-6 weeks to ensure resolution of pneumonia findings.  Discharge Diagnoses:  Active Problems:   Bronchitis   OSA on CPAP   Hypoxia   Hypokalemia   CKD (chronic kidney disease) stage 2, GFR 60-89 ml/min   Asthma exacerbation   Pneumonia   CAP (community acquired pneumonia)   Discharge Condition: Improved & Stable  Diet recommendation: Heart healthy diet.  Filed Weights   05/07/14 0130  Weight: 181.892 kg (401 lb)    History of present illness:  54 year old male patient with history of morbid obesity, OSA on nightly C Pap, asthma, presented with 4 days history of worsening cough, dyspnea, wheezing, low-grade fever. Recently seen in ED and prescribed albuterol. Return with worsening symptoms. In the ED, found to be hypoxic at 86%, tachycardic 115/m. Repeat chest x-ray unremarkable. CTA chest negative for PE but showed right lower lobe CAP.   Hospital Course:   1. Right lower lobe community acquired pneumonia: Treated empirically with IV Rocephin and azithromycin for 3 days. Will transition to by mouth levofloxacin and complete total 7 days treatment. Although initial chest x-ray was unremarkable, recommend follow-up chest x-ray in 4-6 weeks to ensure stability in no acute findings. 2. Acute hypoxic respiratory failure: Secondary to pneumonia complicating underlying OSA and asthma. Still hypoxic on RA with activity. Patient desaturated to 87% on room air with activity. He may have underlying chronic hypoxic respiratory failure. Patient will be discharged on oxygen 2 L/m, with activity. This can be reevaluated during outpatient  follow-up. 3. OSA: Continue nightly CPAP. 4. Asthma exacerbation: Had bronchospasm on admission which has since improved. No significant wheezing. Received a dose of Solu-Medrol in ED. Continue oxygen, bronchodilators and antibiotics. 5. Morbid obesity: 6. Hypokalemia: Replaced 7. Essential hypertension: Controlled. Continue clonidine and amlodipine. 8. Stage II chronic kidney disease: Creatinine at baseline. 9. Hyperglycemia: Possibly from Solu-Medrol. A1c 6.3. SSI as needed.  Consultations:  None  Procedures:  None    Discharge Exam:  Complaints:  Feels much better. Denies dyspnea. Minimal dry cough.  Filed Vitals:   05/09/14 0918 05/09/14 0937 05/09/14 1000 05/09/14 1005  BP:  118/59 121/67   Pulse:  92 93   Temp:   98.7 F (37.1 C)   TempSrc:   Oral   Resp:   20   Height:      Weight:      SpO2: 97%  98% 94%    General exam: Moderately built and morbidly obese male seen ambulating comfortably in the halls without assistance. Respiratory system: Occasional posterior rhonchi but otherwise clear to auscultation. No increased work of breathing. Cardiovascular system: S1 & S2 heard, RRR. No JVD, murmurs, gallops, clicks or pedal edema. Gastrointestinal system: Abdomen is nondistended, soft and nontender. Normal bowel sounds heard. Central nervous system: Alert and oriented. No focal neurological deficits. Extremities: Symmetric 5 x 5 power.  Discharge Instructions      Discharge Instructions    Call MD for:  difficulty breathing, headache or visual disturbances    Complete by:  As directed      Call MD for:  temperature >100.4    Complete by:  As directed  Diet - low sodium heart healthy    Complete by:  As directed      Discharge instructions    Complete by:  As directed   1) Continue CPAP at bedtime. 2) Oxygen via nasal cannula at 2 L/min, especially with activity.     Increase activity slowly    Complete by:  As directed             Medication  List    TAKE these medications        albuterol 108 (90 BASE) MCG/ACT inhaler  Commonly known as:  PROVENTIL HFA;VENTOLIN HFA  Inhale 2 puffs into the lungs every 6 (six) hours as needed for shortness of breath.     amLODipine 10 MG tablet  Commonly known as:  NORVASC  Take 10 mg by mouth every morning.     cloNIDine 0.1 MG tablet  Commonly known as:  CATAPRES  Take 0.1 mg by mouth every morning.     levofloxacin 750 MG tablet  Commonly known as:  LEVAQUIN  Take 1 tablet (750 mg total) by mouth daily. Start: 05/10/2014     naproxen sodium 220 MG tablet  Commonly known as:  ANAPROX  Take 220 mg by mouth 2 (two) times daily as needed (pain).       Follow-up Information    Follow up with Phillip Noble, MD. Schedule an appointment as soon as possible for a visit in 5 days.   Specialty:  Internal Medicine   Why:  Hospital follow-up. To be seen with repeat labs (CBC & BMP). Recommend repeating chest x-ray in 4-6 weeks.   Contact information:   [address], [place] [address] [phone]        The results of significant diagnostics from this hospitalization (including imaging, microbiology, ancillary and laboratory) are listed below for reference.    Significant Diagnostic Studies: Dg Chest 2 View  05/06/2014   CLINICAL DATA:  Cough congestion and dyspnea for 4 days.  EXAM: CHEST  2 VIEW  COMPARISON:  05/05/2014  FINDINGS: The lungs are clear. There are no effusions. Pulmonary vasculature is normal. Hilar, mediastinal and cardiac contours appear unremarkable.  No significant skeletal abnormalities are evident. There is no significant interval change.  IMPRESSION: No active cardiopulmonary disease.   Electronically Signed   By: Andreas Newport M.D.   On: 05/06/2014 21:29   Dg Chest 2 View  05/05/2014   CLINICAL DATA:  Cold for a week. Cough for 3 days. Sore throat this morning. Shortness of breath.  EXAM: CHEST  2 VIEW  COMPARISON:  05/14/2010  FINDINGS:  Shallow inspiration. Normal heart size and pulmonary vascularity. No focal airspace disease or consolidation in the lungs. No blunting of costophrenic angles. No pneumothorax. Mediastinal contours appear intact.  IMPRESSION: No active cardiopulmonary disease.   Electronically Signed   By: Lucienne Capers M.D.   On: 05/05/2014 02:55   Ct Angio Chest Pe W/cm &/or Wo Cm  05/06/2014   CLINICAL DATA:  Acute onset of shortness of breath and congestion. Productive cough. Initial encounter.  EXAM: CT ANGIOGRAPHY CHEST WITH CONTRAST  TECHNIQUE: Multidetector CT imaging of the chest was performed using the standard protocol during bolus administration of intravenous contrast. Multiplanar CT image reconstructions and MIPs were obtained to evaluate the vascular anatomy.  CONTRAST:  123mL OMNIPAQUE IOHEXOL 350 MG/ML SOLN  COMPARISON:  CTA of the chest performed 08/10/2009, and chest radiograph performed earlier today at 9:20 p.m.  FINDINGS: There  is no evidence of pulmonary embolus.  Mild patchy right basilar airspace opacities are concerning for pneumonia. There is no evidence of pleural effusion or pneumothorax. No masses are identified; no abnormal focal contrast enhancement is seen.  The mediastinum is unremarkable in appearance. No mediastinal lymphadenopathy is seen. No pericardial effusion is identified. The great vessels are grossly unremarkable in appearance. No axillary lymphadenopathy is seen. The visualized portions of the thyroid gland are unremarkable in appearance.  The visualized portions of the liver and spleen are unremarkable.  No acute osseous abnormalities are seen.  Review of the MIP images confirms the above findings.  IMPRESSION: 1. No evidence of pulmonary embolus. 2. Mild patchy right lower lobe pneumonia noted.   Electronically Signed   By: Garald Balding M.D.   On: 05/06/2014 23:45    Microbiology: Recent Results (from the past 240 hour(s))  Culture, sputum-assessment     Status: None    Collection Time: 05/07/14  2:49 AM  Result Value Ref Range Status   Specimen Description SPUTUM  Final   Special Requests Normal  Final   Sputum evaluation   Final    THIS SPECIMEN IS ACCEPTABLE. RESPIRATORY CULTURE REPORT TO FOLLOW.   Report Status 05/07/2014 FINAL  Final  Culture, respiratory (NON-Expectorated)     Status: None   Collection Time: 05/07/14  2:49 AM  Result Value Ref Range Status   Specimen Description SPUTUM  Final   Special Requests NONE  Final   Gram Stain   Final    MODERATE WBC PRESENT,BOTH PMN AND MONONUCLEAR FEW SQUAMOUS EPITHELIAL CELLS PRESENT MODERATE GRAM POSITIVE COCCI IN PAIRS AND CHAINS RARE GRAM NEGATIVE RODS Performed at [facility]    Culture   Final    NORMAL OROPHARYNGEAL FLORA Performed at [facility]    Report Status 05/09/2014 FINAL  Final     Labs: Basic Metabolic Panel:  Recent Labs Lab 05/06/14 2128 05/07/14 0444 05/08/14 0450  NA 138 138 137  K 3.1* 3.3* 3.7  CL 99 98 101  CO2 30 29 32  GLUCOSE 209* 187* 124*  BUN 19 18 24*  CREATININE 1.74* 1.51* 1.53*  CALCIUM 8.9 8.9 8.6  MG  --  1.8  --   PHOS  --  2.7  --    Liver Function Tests:  Recent Labs Lab 05/06/14 2128 05/07/14 0444  AST 32 31  ALT 21 21  ALKPHOS 85 79  BILITOT 0.6 0.4  PROT 7.4 7.6  ALBUMIN 3.9 3.9   No results for input(s): LIPASE, AMYLASE in the last 168 hours. No results for input(s): AMMONIA in the last 168 hours. CBC:  Recent Labs Lab 05/06/14 2128  WBC 8.1  NEUTROABS 5.9  HGB 13.3  HCT 42.6  MCV 86.8  PLT 163   Cardiac Enzymes: No results for input(s): CKTOTAL, CKMB, CKMBINDEX, TROPONINI in the last 168 hours. BNP: BNP (last 3 results)  Recent Labs  05/06/14 2128  BNP 125.6*    ProBNP (last 3 results) No results for input(s): PROBNP in the last 8760 hours.  CBG:  Recent Labs Lab 05/08/14 1131 05/08/14 1652 05/08/14 2130 05/09/14 0805 05/09/14 1130  GLUCAP 108* 105* 111* 106* 99         Signed:  HONGALGI,ANAND, MD, FACP, FHM. [facility] Pager [phone]  If 7PM-7AM, please contact night-coverage [email or web address] Password [email or web address] 05/09/2014, 11:54 AM

## 2014-05-09 NOTE — Progress Notes (Signed)
Patient's d/c instructions rendered,teach back utilized,patient verbalized  understanding. Notified CM that he requested a portable oxygen. CM said that she had proceeded with the request,and will follow -up. Patient notified.

## 2014-05-09 NOTE — Discharge Instructions (Signed)

## 2014-05-09 NOTE — Progress Notes (Signed)
Patient oxygen level when ambulating was 87% RA. At rest 94% RA and with 2L oxygen per nasal cannula- 99%. Dr. Algis Liming aware re: these results

## 2014-05-09 NOTE — Progress Notes (Signed)
Patient did not have 02 on.  He just finished washing up .  91 until deep breaths then he came up to 97.

## 2014-05-09 NOTE — Care Management Note (Signed)
CARE MANAGEMENT NOTE 05/09/2014  Patient:  Phillip [name], Phillip [name]   Account Number:  [id number]  Date Initiated:  05/09/2014  Documentation initiated by:  Marney Doctor  Subjective/Objective Assessment:   54 yo admitted with Bronchitis     Action/Plan:   From home alone   Anticipated DC Date:  05/09/2014   Anticipated DC Plan:  [place]  CM consult      Choice offered to / List presented to:             Status of service:  Completed, signed off Medicare Important Message given?   (If response is "NO", the following Medicare IM given date fields will be blank) Date Medicare IM given:   Medicare IM given by:   Date Additional Medicare IM given:   Additional Medicare IM given by:    Discharge Disposition:    Per UR Regulation:  Reviewed for med. necessity/level of care/duration of stay  If discussed at [place] of Stay Meetings, dates discussed:    Comments:  Marney Doctor RN,BSN,NCM [phone] MD order for Home 02.  Met with pt at bedside.  Pt states he has home 02 and a cpap at home currently (through [place]).  Pt states that he does not have a portable tank and he does not want one. He states that he has not used his 02 machine in about 3 years.  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  Pt encouraged multiple times to allow this CM to contact [facility] to obtain a portable tank for him and pt refuses. Staff RN made aware.  No other CM needs noted at this time.

## 2014-12-04 ENCOUNTER — Encounter (HOSPITAL_COMMUNITY): Payer: Self-pay | Admitting: Family Medicine

## 2014-12-04 ENCOUNTER — Emergency Department (HOSPITAL_BASED_OUTPATIENT_CLINIC_OR_DEPARTMENT_OTHER)
Admit: 2014-12-04 | Discharge: 2014-12-04 | Disposition: A | Discharge status: Home / Self Care | Payer: Managed Care, Other (non HMO) | Attending: Emergency Medicine | Admitting: Emergency Medicine

## 2014-12-04 ENCOUNTER — Emergency Department (HOSPITAL_COMMUNITY): Payer: Managed Care, Other (non HMO)

## 2014-12-04 ENCOUNTER — Emergency Department (HOSPITAL_COMMUNITY)
Admission: EM | Admit: 2014-12-04 | Discharge: 2014-12-04 | Disposition: A | Discharge status: Home / Self Care | Payer: Managed Care, Other (non HMO) | Attending: Emergency Medicine | Admitting: Emergency Medicine

## 2014-12-04 DIAGNOSIS — M79609 Pain in unspecified limb: Secondary | ICD-10-CM | POA: Diagnosis not present

## 2014-12-04 DIAGNOSIS — R05 Cough: Secondary | ICD-10-CM | POA: Insufficient documentation

## 2014-12-04 DIAGNOSIS — Z79899 Other long term (current) drug therapy: Secondary | ICD-10-CM | POA: Diagnosis not present

## 2014-12-04 DIAGNOSIS — G4733 Obstructive sleep apnea (adult) (pediatric): Secondary | ICD-10-CM | POA: Insufficient documentation

## 2014-12-04 DIAGNOSIS — M109 Gout, unspecified: Secondary | ICD-10-CM | POA: Insufficient documentation

## 2014-12-04 DIAGNOSIS — Z9981 Dependence on supplemental oxygen: Secondary | ICD-10-CM | POA: Insufficient documentation

## 2014-12-04 DIAGNOSIS — R0600 Dyspnea, unspecified: Secondary | ICD-10-CM | POA: Insufficient documentation

## 2014-12-04 DIAGNOSIS — I10 Essential (primary) hypertension: Secondary | ICD-10-CM | POA: Insufficient documentation

## 2014-12-04 DIAGNOSIS — R0602 Shortness of breath: Secondary | ICD-10-CM | POA: Diagnosis present

## 2014-12-04 LAB — CBC WITH DIFFERENTIAL/PLATELET
BASOS PCT: 0 % (ref 0–1)
Basophils Absolute: 0 10*3/uL (ref 0.0–0.1)
Eosinophils Absolute: 0 10*3/uL (ref 0.0–0.7)
Eosinophils Relative: 1 % (ref 0–5)
HEMATOCRIT: 41.6 % (ref 39.0–52.0)
Hemoglobin: 12.8 g/dL — ABNORMAL LOW (ref 13.0–17.0)
LYMPHS ABS: 1.4 10*3/uL (ref 0.7–4.0)
Lymphocytes Relative: 31 % (ref 12–46)
MCH: 27.5 pg (ref 26.0–34.0)
MCHC: 30.8 g/dL (ref 30.0–36.0)
MCV: 89.3 fL (ref 78.0–100.0)
MONO ABS: 0.3 10*3/uL (ref 0.1–1.0)
MONOS PCT: 7 % (ref 3–12)
NEUTROS ABS: 2.8 10*3/uL (ref 1.7–7.7)
NEUTROS PCT: 61 % (ref 43–77)
Platelets: 169 10*3/uL (ref 150–400)
RBC: 4.66 MIL/uL (ref 4.22–5.81)
RDW: 13.7 % (ref 11.5–15.5)
WBC: 4.5 10*3/uL (ref 4.0–10.5)

## 2014-12-04 LAB — URINALYSIS, ROUTINE W REFLEX MICROSCOPIC
Bilirubin Urine: NEGATIVE
GLUCOSE, UA: NEGATIVE mg/dL
Hgb urine dipstick: NEGATIVE
KETONES UR: NEGATIVE mg/dL
LEUKOCYTES UA: NEGATIVE
Nitrite: NEGATIVE
PH: 6.5 (ref 5.0–8.0)
Protein, ur: 30 mg/dL — AB
Specific Gravity, Urine: 1.017 (ref 1.005–1.030)
Urobilinogen, UA: 1 mg/dL (ref 0.0–1.0)

## 2014-12-04 LAB — BASIC METABOLIC PANEL
Anion gap: 7 (ref 5–15)
BUN: 16 mg/dL (ref 6–20)
CHLORIDE: 101 mmol/L (ref 101–111)
CO2: 35 mmol/L — AB (ref 22–32)
CREATININE: 1.29 mg/dL — AB (ref 0.61–1.24)
Calcium: 8.8 mg/dL — ABNORMAL LOW (ref 8.9–10.3)
GFR calc Af Amer: >60 mL/min (ref >60)
GFR calc non Af Amer: >60 mL/min (ref >60)
GLUCOSE: 97 mg/dL (ref 65–99)
POTASSIUM: 3.2 mmol/L — AB (ref 3.5–5.1)
Sodium: 143 mmol/L (ref 135–145)

## 2014-12-04 LAB — D-DIMER, QUANTITATIVE: D-Dimer, Quant: 0.7 ug/mL-FEU — ABNORMAL HIGH (ref 0.00–0.48)

## 2014-12-04 LAB — TROPONIN I: Troponin I: 0.03 ng/mL (ref <0.031)

## 2014-12-04 LAB — URINE MICROSCOPIC-ADD ON: URINE-OTHER: NONE SEEN

## 2014-12-04 LAB — BRAIN NATRIURETIC PEPTIDE: B Natriuretic Peptide: 185.8 pg/mL — ABNORMAL HIGH (ref 0.0–100.0)

## 2014-12-04 NOTE — ED Notes (Signed)
MD at bedside. 

## 2014-12-04 NOTE — ED Notes (Signed)
Patient states he has been short of breath for a while but tonight at 3:00 am it got worse. For about one month, he states he has a dry cough and fatigue. Pt has an inhaler but didn't use it before arriving.

## 2014-12-04 NOTE — Discharge Instructions (Signed)

## 2014-12-04 NOTE — Progress Notes (Signed)
*  PRELIMINARY RESULTS* Vascular Ultrasound Left lower extremity venous duplex has been completed.  Preliminary findings: negative for DVT and baker's cyst. Multiple varicose veins noted in the left calf.   Landry Mellow, RDMS, RVT  12/04/2014, 9:00 AM

## 2014-12-04 NOTE — ED Provider Notes (Signed)
CSN: [id number]     Arrival date & time 12/04/14  0417 History   First MD Initiated Contact with Patient 12/04/14 0447     Chief Complaint  Patient presents with  . Shortness of Breath     (Consider location/radiation/quality/duration/timing/severity/associated sxs/prior Treatment) HPI Comments: 54 year old male patient with history of morbid obesity, OSA on nightly C Pap, asthma, PE comes in with cc of dib, cough with congestion. Pt has been having dib for several weeks now, and he has been progressively getting worse. His DIB is exertional. No chest pain. Pt has been having some anterior chest congestion that started 2 weeks ago which has progressed. + cough, dry. No CAD, CHF. PT also c/o arm pain, L side for 2 months now which is constant. Worse with certain position and moving.  Patient is a 54 y.o. male presenting with shortness of breath. The history is provided by the patient.  Shortness of Breath Associated symptoms: cough   Associated symptoms: no abdominal pain and no chest pain     Past Medical History  Diagnosis Date  . Hypertension   . Gout   . OSA on CPAP   . Morbid obesity    Past Surgical History  Procedure Laterality Date  . Hernia repair     Family History  Problem Relation Age of Onset  . Diabetes Father   . CAD Father   . Hypertension Other   . Diabetes Other    Social History  Substance Use Topics  . Smoking status: Never Smoker   . Smokeless tobacco: None  . Alcohol Use: No    Review of Systems  Constitutional: Negative for activity change and appetite change.  Respiratory: Positive for cough and shortness of breath.   Cardiovascular: Negative for chest pain.  Gastrointestinal: Negative for abdominal pain.  Genitourinary: Negative for dysuria.  Musculoskeletal: Positive for arthralgias.  All other systems reviewed and are negative.     Allergies  Review of patient's allergies indicates no known allergies.  Home Medications   Prior to  Admission medications   Medication Sig Start Date End Date Taking? Authorizing Provider  albuterol (PROVENTIL HFA;VENTOLIN HFA) 108 (90 BASE) MCG/ACT inhaler Inhale 2 puffs into the lungs every 6 (six) hours as needed for shortness of breath. 05/09/14  Yes Modena Jansky, MD  amLODipine (NORVASC) 10 MG tablet Take 10 mg by mouth every morning.    Yes Historical Provider, MD  cloNIDine (CATAPRES) 0.1 MG tablet Take 0.1 mg by mouth every morning.    Yes Historical Provider, MD  colchicine 0.6 MG tablet Take 0.6 mg by mouth daily as needed. For gout   Yes Historical Provider, MD  levofloxacin (LEVAQUIN) 750 MG tablet Take 1 tablet (750 mg total) by mouth daily. Start: 05/10/2014 Patient not taking: Reported on 12/04/2014 05/09/14   Modena Jansky, MD  naproxen sodium (ANAPROX) 220 MG tablet Take 220 mg by mouth 2 (two) times daily as needed (pain).    Historical Provider, MD   BP 136/81 mmHg  Pulse 69  Temp(Src) 98.1 F (36.7 C) (Oral)  Resp 18  Ht 5\' 9"  (1.753 m)  Wt 401 lb (181.892 kg)  BMI 59.19 kg/m2  SpO2 95% Physical Exam  Constitutional: He is oriented to person, place, and time. He appears well-developed.  HENT:  Head: Normocephalic and atraumatic.  Eyes: Conjunctivae and EOM are normal. Pupils are equal, round, and reactive to light.  Neck: Normal range of motion. Neck supple.  Cardiovascular: Normal rate, regular  rhythm and normal heart sounds.   Pulmonary/Chest: Effort normal and breath sounds normal. No respiratory distress. He has no wheezes.  Abdominal: Soft. Bowel sounds are normal. He exhibits no distension. There is no tenderness. There is no rebound and no guarding.  Neurological: He is alert and oriented to person, place, and time.  Skin: Skin is warm.  Nursing note and vitals reviewed.   ED Course  Procedures (including critical care time) Labs Review Labs Reviewed  CBC WITH DIFFERENTIAL/PLATELET - Abnormal; Notable for the following:    Hemoglobin 12.8 (*)     All other components within normal limits  BRAIN NATRIURETIC PEPTIDE - Abnormal; Notable for the following:    B Natriuretic Peptide 185.8 (*)    All other components within normal limits  BASIC METABOLIC PANEL - Abnormal; Notable for the following:    Potassium 3.2 (*)    CO2 35 (*)    Creatinine, Ser 1.29 (*)    Calcium 8.8 (*)    All other components within normal limits  URINALYSIS, ROUTINE W REFLEX MICROSCOPIC (NOT AT [facility]) - Abnormal; Notable for the following:    Protein, ur 30 (*)    All other components within normal limits  D-DIMER, QUANTITATIVE (NOT AT [facility]) - Abnormal; Notable for the following:    D-Dimer, Quant 0.70 (*)    All other components within normal limits  TROPONIN I  URINE MICROSCOPIC-ADD ON    Imaging Review Dg Chest 2 View  12/04/2014   CLINICAL DATA:  Awoke with shortness of breath and difficulty breathing this morning.  EXAM: CHEST  2 VIEW  COMPARISON:  Radiographs and CT 05/06/2014  FINDINGS: The cardiomediastinal contours are normal. The lungs are clear. Pulmonary vasculature is normal. No consolidation, pleural effusion, or pneumothorax. No acute osseous abnormalities are seen.  IMPRESSION: No acute pulmonary process.   Electronically Signed   By: Jeb Levering M.D.   On: 12/04/2014 06:48   I have personally reviewed and evaluated these images and lab results as part of my medical decision-making.   EKG Interpretation   Date/Time:  Monday December 04 2014 05:52:45 EDT Ventricular Rate:  64 PR Interval:  154 QRS Duration: 91 QT Interval:  419 QTC Calculation: 432 R Axis:   -15 Text Interpretation:  Sinus rhythm Borderline left axis deviation Low  voltage, precordial leads Nonspecific T abnormalities, lateral leads No  significant change since last tracing Confirmed by Samadhi Mahurin, MD, Wilda Wetherell  ([id number]) on 12/04/2014 7:26:42 AM      MDM   Final diagnoses:  Dyspnea    Pt with DIB, cough, shoulder pain. All of his symptoms are not new. He  felt that his dib got worse at night, so he comes to the ER. He is comfortable right now. Lung exam is clear. Trop and CXR and EKG are reassuring. Dimer ordered for leg swelling - and US DVT ordered as it is +. Dr. Wilson Singer to f.u.     Varney Biles, MD 12/05/14 [phone]

## 2015-02-20 ENCOUNTER — Emergency Department (HOSPITAL_COMMUNITY): Payer: Managed Care, Other (non HMO)

## 2015-02-20 ENCOUNTER — Encounter (HOSPITAL_COMMUNITY): Payer: Self-pay | Admitting: Emergency Medicine

## 2015-02-20 ENCOUNTER — Inpatient Hospital Stay (HOSPITAL_COMMUNITY)
Admission: EM | Admit: 2015-02-20 | Discharge: 2015-02-22 | DRG: 291 | Disposition: A | Discharge status: Home / Self Care | Payer: Managed Care, Other (non HMO) | Attending: Internal Medicine | Admitting: Internal Medicine

## 2015-02-20 DIAGNOSIS — M10012 Idiopathic gout, left shoulder: Secondary | ICD-10-CM

## 2015-02-20 DIAGNOSIS — N183 Chronic kidney disease, stage 3 (moderate): Secondary | ICD-10-CM | POA: Diagnosis present

## 2015-02-20 DIAGNOSIS — Z833 Family history of diabetes mellitus: Secondary | ICD-10-CM

## 2015-02-20 DIAGNOSIS — M7989 Other specified soft tissue disorders: Secondary | ICD-10-CM | POA: Diagnosis not present

## 2015-02-20 DIAGNOSIS — N184 Chronic kidney disease, stage 4 (severe): Secondary | ICD-10-CM | POA: Diagnosis present

## 2015-02-20 DIAGNOSIS — R600 Localized edema: Secondary | ICD-10-CM | POA: Diagnosis present

## 2015-02-20 DIAGNOSIS — Z86711 Personal history of pulmonary embolism: Secondary | ICD-10-CM

## 2015-02-20 DIAGNOSIS — M79609 Pain in unspecified limb: Secondary | ICD-10-CM | POA: Diagnosis not present

## 2015-02-20 DIAGNOSIS — R0602 Shortness of breath: Secondary | ICD-10-CM | POA: Diagnosis present

## 2015-02-20 DIAGNOSIS — I5023 Acute on chronic systolic (congestive) heart failure: Secondary | ICD-10-CM | POA: Diagnosis present

## 2015-02-20 DIAGNOSIS — J9600 Acute respiratory failure, unspecified whether with hypoxia or hypercapnia: Secondary | ICD-10-CM | POA: Diagnosis present

## 2015-02-20 DIAGNOSIS — Z79899 Other long term (current) drug therapy: Secondary | ICD-10-CM

## 2015-02-20 DIAGNOSIS — R0600 Dyspnea, unspecified: Secondary | ICD-10-CM | POA: Diagnosis not present

## 2015-02-20 DIAGNOSIS — I10 Essential (primary) hypertension: Secondary | ICD-10-CM | POA: Diagnosis present

## 2015-02-20 DIAGNOSIS — J9601 Acute respiratory failure with hypoxia: Secondary | ICD-10-CM

## 2015-02-20 DIAGNOSIS — R7989 Other specified abnormal findings of blood chemistry: Secondary | ICD-10-CM | POA: Diagnosis present

## 2015-02-20 DIAGNOSIS — Z6841 Body Mass Index (BMI) 40.0 and over, adult: Secondary | ICD-10-CM | POA: Diagnosis not present

## 2015-02-20 DIAGNOSIS — M109 Gout, unspecified: Secondary | ICD-10-CM | POA: Diagnosis present

## 2015-02-20 DIAGNOSIS — J9621 Acute and chronic respiratory failure with hypoxia: Secondary | ICD-10-CM | POA: Diagnosis present

## 2015-02-20 DIAGNOSIS — G4733 Obstructive sleep apnea (adult) (pediatric): Secondary | ICD-10-CM | POA: Diagnosis present

## 2015-02-20 DIAGNOSIS — R0902 Hypoxemia: Secondary | ICD-10-CM | POA: Diagnosis present

## 2015-02-20 DIAGNOSIS — Z9119 Patient's noncompliance with other medical treatment and regimen: Secondary | ICD-10-CM

## 2015-02-20 DIAGNOSIS — E876 Hypokalemia: Secondary | ICD-10-CM | POA: Diagnosis present

## 2015-02-20 DIAGNOSIS — Z9981 Dependence on supplemental oxygen: Secondary | ICD-10-CM | POA: Diagnosis not present

## 2015-02-20 DIAGNOSIS — I130 Hypertensive heart and chronic kidney disease with heart failure and stage 1 through stage 4 chronic kidney disease, or unspecified chronic kidney disease: Principal | ICD-10-CM | POA: Diagnosis present

## 2015-02-20 DIAGNOSIS — Z9989 Dependence on other enabling machines and devices: Secondary | ICD-10-CM

## 2015-02-20 DIAGNOSIS — Z8249 Family history of ischemic heart disease and other diseases of the circulatory system: Secondary | ICD-10-CM

## 2015-02-20 DIAGNOSIS — R778 Other specified abnormalities of plasma proteins: Secondary | ICD-10-CM | POA: Diagnosis present

## 2015-02-20 HISTORY — DX: Pneumonia, unspecified organism: J18.9

## 2015-02-20 HISTORY — DX: Other pulmonary embolism without acute cor pulmonale: I26.99

## 2015-02-20 LAB — CBC
HCT: 43.9 % (ref 39.0–52.0)
Hemoglobin: 13.2 g/dL (ref 13.0–17.0)
MCH: 26.9 pg (ref 26.0–34.0)
MCHC: 30.1 g/dL (ref 30.0–36.0)
MCV: 89.4 fL (ref 78.0–100.0)
PLATELETS: 184 10*3/uL (ref 150–400)
RBC: 4.91 MIL/uL (ref 4.22–5.81)
RDW: 13.8 % (ref 11.5–15.5)
WBC: 4.7 10*3/uL (ref 4.0–10.5)

## 2015-02-20 LAB — I-STAT TROPONIN, ED: TROPONIN I, POC: 0.03 ng/mL (ref 0.00–0.08)

## 2015-02-20 LAB — APTT: APTT: 35 s (ref 24–37)

## 2015-02-20 LAB — BRAIN NATRIURETIC PEPTIDE: B NATRIURETIC PEPTIDE 5: 194.3 pg/mL — AB (ref 0.0–100.0)

## 2015-02-20 LAB — BASIC METABOLIC PANEL
Anion gap: 8 (ref 5–15)
BUN: 14 mg/dL (ref 6–20)
CHLORIDE: 97 mmol/L — AB (ref 101–111)
CO2: 35 mmol/L — ABNORMAL HIGH (ref 22–32)
CREATININE: 1.51 mg/dL — AB (ref 0.61–1.24)
Calcium: 8.9 mg/dL (ref 8.9–10.3)
GFR calc non Af Amer: 51 mL/min — ABNORMAL LOW (ref >60)
GFR, EST AFRICAN AMERICAN: 60 mL/min — AB (ref >60)
Glucose, Bld: 198 mg/dL — ABNORMAL HIGH (ref 65–99)
Potassium: 3.4 mmol/L — ABNORMAL LOW (ref 3.5–5.1)
SODIUM: 140 mmol/L (ref 135–145)

## 2015-02-20 LAB — PROTIME-INR
INR: 1.02 (ref 0.00–1.49)
PROTHROMBIN TIME: 13.6 s (ref 11.6–15.2)

## 2015-02-20 LAB — TROPONIN I
TROPONIN I: 0.04 ng/mL — AB (ref <0.031)
Troponin I: 0.03 ng/mL (ref <0.031)

## 2015-02-20 MED ORDER — ACETAMINOPHEN 325 MG PO TABS: 650.0000 mg | ORAL_TABLET | Freq: Four times a day (QID) | ORAL | Status: DC | PRN

## 2015-02-20 MED ORDER — METHYLPREDNISOLONE SODIUM SUCC 125 MG IJ SOLR
125.0000 mg | Freq: Once | INTRAMUSCULAR | Status: AC
  Administered 2015-02-20: 125 mg via INTRAVENOUS
  Filled 2015-02-20: qty 2

## 2015-02-20 MED ORDER — HEPARIN BOLUS VIA INFUSION
4000.0000 [IU] | Freq: Once | INTRAVENOUS | Status: DC
  Filled 2015-02-20: qty 4000

## 2015-02-20 MED ORDER — ALUM & MAG HYDROXIDE-SIMETH 200-200-20 MG/5ML PO SUSP: 30.0000 mL | Freq: Four times a day (QID) | ORAL | Status: DC | PRN

## 2015-02-20 MED ORDER — IOHEXOL 350 MG/ML SOLN
100.0000 mL | Freq: Once | INTRAVENOUS | Status: AC | PRN
  Administered 2015-02-20: 100 mL via INTRAVENOUS

## 2015-02-20 MED ORDER — ACETAMINOPHEN 650 MG RE SUPP: 650.0000 mg | Freq: Four times a day (QID) | RECTAL | Status: DC | PRN

## 2015-02-20 MED ORDER — ONDANSETRON HCL 4 MG/2ML IJ SOLN: 4.0000 mg | Freq: Four times a day (QID) | INTRAMUSCULAR | Status: DC | PRN

## 2015-02-20 MED ORDER — HEPARIN SODIUM (PORCINE) 5000 UNIT/ML IJ SOLN: 5000.0000 [IU] | Freq: Three times a day (TID) | INTRAMUSCULAR | Status: DC

## 2015-02-20 MED ORDER — CLONIDINE HCL 0.1 MG PO TABS
0.1000 mg | ORAL_TABLET | Freq: Two times a day (BID) | ORAL | Status: DC
  Administered 2015-02-21 – 2015-02-22 (×4): 0.1 mg via ORAL
  Filled 2015-02-20 (×4): qty 1

## 2015-02-20 MED ORDER — LORAZEPAM 2 MG/ML IJ SOLN
1.0000 mg | Freq: Once | INTRAMUSCULAR | Status: AC
  Administered 2015-02-20: 1 mg via INTRAVENOUS
  Filled 2015-02-20: qty 1

## 2015-02-20 MED ORDER — ALBUTEROL SULFATE HFA 108 (90 BASE) MCG/ACT IN AERS: 2.0000 | INHALATION_SPRAY | Freq: Four times a day (QID) | RESPIRATORY_TRACT | Status: DC | PRN

## 2015-02-20 MED ORDER — DIPHENHYDRAMINE HCL 50 MG/ML IJ SOLN
25.0000 mg | Freq: Once | INTRAMUSCULAR | Status: AC
  Administered 2015-02-20: 25 mg via INTRAVENOUS
  Filled 2015-02-20: qty 1

## 2015-02-20 MED ORDER — NITROGLYCERIN 0.4 MG SL SUBL: 0.4000 mg | SUBLINGUAL_TABLET | SUBLINGUAL | Status: DC | PRN

## 2015-02-20 MED ORDER — HEPARIN BOLUS VIA INFUSION
4000.0000 [IU] | Freq: Once | INTRAVENOUS | Status: AC
  Administered 2015-02-21: 4000 [IU] via INTRAVENOUS
  Filled 2015-02-20: qty 4000

## 2015-02-20 MED ORDER — HYDRALAZINE HCL 20 MG/ML IJ SOLN: 5.0000 mg | INTRAMUSCULAR | Status: DC | PRN

## 2015-02-20 MED ORDER — GUAIFENESIN ER 600 MG PO TB12
600.0000 mg | ORAL_TABLET | Freq: Two times a day (BID) | ORAL | Status: DC
Start: 2015-02-20 — End: 2015-02-22
  Administered 2015-02-21 – 2015-02-22 (×4): 600 mg via ORAL
  Filled 2015-02-20 (×6): qty 1

## 2015-02-20 MED ORDER — ATORVASTATIN CALCIUM 40 MG PO TABS
40.0000 mg | ORAL_TABLET | Freq: Every day | ORAL | Status: DC
  Administered 2015-02-21 (×2): 40 mg via ORAL
  Filled 2015-02-20 (×4): qty 1

## 2015-02-20 MED ORDER — AMLODIPINE BESYLATE 10 MG PO TABS
10.0000 mg | ORAL_TABLET | Freq: Every morning | ORAL | Status: DC
  Administered 2015-02-21 – 2015-02-22 (×2): 10 mg via ORAL
  Filled 2015-02-20 (×2): qty 1

## 2015-02-20 MED ORDER — ONDANSETRON HCL 4 MG PO TABS: 4.0000 mg | ORAL_TABLET | Freq: Four times a day (QID) | ORAL | Status: DC | PRN

## 2015-02-20 MED ORDER — HEPARIN (PORCINE) IN NACL 100-0.45 UNIT/ML-% IJ SOLN
2200.0000 [IU]/h | INTRAMUSCULAR | Status: DC
  Administered 2015-02-21: 2200 [IU]/h via INTRAVENOUS
  Filled 2015-02-20 (×2): qty 250

## 2015-02-20 MED ORDER — MORPHINE SULFATE (PF) 2 MG/ML IV SOLN: 2.0000 mg | INTRAVENOUS | Status: DC | PRN

## 2015-02-20 MED ORDER — ASPIRIN 325 MG PO TABS
325.0000 mg | ORAL_TABLET | Freq: Every day | ORAL | Status: DC
Start: 2015-02-20 — End: 2015-02-21
  Administered 2015-02-21 (×2): 325 mg via ORAL
  Filled 2015-02-20 (×2): qty 1

## 2015-02-20 MED ORDER — HEPARIN (PORCINE) IN NACL 100-0.45 UNIT/ML-% IJ SOLN
2200.0000 [IU]/h | INTRAMUSCULAR | Status: DC
  Filled 2015-02-20: qty 250

## 2015-02-20 MED ORDER — SODIUM CHLORIDE 0.9 % IJ SOLN
3.0000 mL | Freq: Two times a day (BID) | INTRAMUSCULAR | Status: DC
Start: 2015-02-20 — End: 2015-02-22

## 2015-02-20 NOTE — ED Notes (Signed)
Pt ambulated with out assistance SPO2 75-78 MD is aware.

## 2015-02-20 NOTE — ED Provider Notes (Signed)
CSN: [id number]     Arrival date & time 02/20/15  1616 History   First MD Initiated Contact with Patient 02/20/15 1655     Chief Complaint  Patient presents with  . Shortness of Breath     (Consider location/radiation/quality/duration/timing/severity/associated sxs/prior Treatment) Patient is a 54 y.o. male presenting with shortness of breath. The history is provided by the patient and medical records.  Shortness of Breath Severity:  Mild Onset quality:  Gradual Duration:  1 week Timing:  Constant Progression:  Worsening Chronicity:  Recurrent Context: activity   Relieved by:  None tried Worsened by:  Nothing tried Ineffective treatments:  None tried Associated symptoms: cough (dry)   Associated symptoms: no abdominal pain, no chest pain, no fever, no hemoptysis, no PND, no sputum production, no syncope and no vomiting     Past Medical History  Diagnosis Date  . Hypertension   . Gout   . OSA on CPAP   . Morbid obesity ([place])   . Pulmonary embolism ([place])   . PNA (pneumonia)    Past Surgical History  Procedure Laterality Date  . Hernia repair     Family History  Problem Relation Age of Onset  . Diabetes Father   . CAD Father   . Hypertension Other   . Diabetes Other    Social History  Substance Use Topics  . Smoking status: Never Smoker   . Smokeless tobacco: None  . Alcohol Use: No    Review of Systems  Constitutional: Negative for fever.  Eyes: Negative for photophobia and pain.  Respiratory: Positive for cough (dry) and shortness of breath. Negative for hemoptysis and sputum production.   Cardiovascular: Positive for leg swelling. Negative for chest pain, syncope and PND.  Gastrointestinal: Negative for nausea, vomiting and abdominal pain.  All other systems reviewed and are negative.     Allergies  Review of patient's allergies indicates no known allergies.  Home Medications   Prior to Admission medications   Medication Sig Start Date End Date  Taking? Authorizing Provider  albuterol (PROVENTIL HFA;VENTOLIN HFA) 108 (90 BASE) MCG/ACT inhaler Inhale 2 puffs into the lungs every 6 (six) hours as needed for shortness of breath. 05/09/14  Yes Modena Jansky, MD  amLODipine (NORVASC) 10 MG tablet Take 10 mg by mouth every morning.    Yes Historical Provider, MD  cloNIDine (CATAPRES) 0.1 MG tablet Take 0.1 mg by mouth every morning.    Yes Historical Provider, MD  colchicine 0.6 MG tablet Take 0.6 mg by mouth daily as needed. For gout   Yes Historical Provider, MD  naproxen sodium (ANAPROX) 220 MG tablet Take 220 mg by mouth 2 (two) times daily as needed (pain).   Yes Historical Provider, MD  levofloxacin (LEVAQUIN) 750 MG tablet Take 1 tablet (750 mg total) by mouth daily. Start: 05/10/2014 Patient not taking: Reported on 12/04/2014 05/09/14   Modena Jansky, MD   BP 126/64 mmHg  Pulse 69  Temp(Src) 97.8 F (36.6 C) (Oral)  Resp 13  Wt 400 lb (181.439 kg)  SpO2 97% Physical Exam  Constitutional: He is oriented to person, place, and time. He appears well-developed and well-nourished.  HENT:  Head: Normocephalic and atraumatic.  Eyes: Conjunctivae are normal. Pupils are equal, round, and reactive to light.  Neck: Normal range of motion.  Cardiovascular: Normal rate and regular rhythm.   Pulmonary/Chest: Effort normal and breath sounds normal. No respiratory distress.  Abdominal: Soft. He exhibits no distension. There is no tenderness. There  is no rebound.  Musculoskeletal: Normal range of motion.  Neurological: He is alert and oriented to person, place, and time. No cranial nerve deficit. Coordination normal.  Skin: Skin is warm and dry.  Nursing note and vitals reviewed.   ED Course  Procedures (including critical care time) Labs Review Labs Reviewed  BASIC METABOLIC PANEL - Abnormal; Notable for the following:    Potassium 3.4 (*)    Chloride 97 (*)    CO2 35 (*)    Glucose, Bld 198 (*)    Creatinine, Ser 1.51 (*)    GFR  calc non Af Amer 51 (*)    GFR calc Af Amer 60 (*)    All other components within normal limits  TROPONIN I - Abnormal; Notable for the following:    Troponin I 0.04 (*)    All other components within normal limits  BRAIN NATRIURETIC PEPTIDE - Abnormal; Notable for the following:    B Natriuretic Peptide 194.3 (*)    All other components within normal limits  CBC  I-STAT TROPOININ, ED    Imaging Review Dg Chest 2 View  02/20/2015  CLINICAL DATA:  Shortness of breath for 3 months worse in past 2 weeks, shortness of breath after normal every day activities, history obesity, pulmonary embolism, pneumonia, bronchitis, hypertension EXAM: CHEST  2 VIEW COMPARISON:  12/04/2014 FINDINGS: Enlargement of cardiac silhouette. Mediastinal contours and pulmonary vascularity normal. Lungs clear. No pleural effusion or pneumothorax. Bones unremarkable. IMPRESSION: Mild enlargement of cardiac silhouette. No acute abnormalities. Electronically Signed   By: Lavonia Dana M.D.   On: 02/20/2015 17:46   I have personally reviewed and evaluated these images and lab results as part of my medical decision-making.   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 Low sats on arrival. LLE edema. Initial  concern for PE, DVT, pneumonia, obstruction 2/2 body habitus, cardiac causes. [place] negative, CTA inconclusive, positive troponin and BNP. Attempted ambulation in the department and sats dropped to 70-71% consistently. Discussed with hospitalist the possibility of getting a V/Q scan in the am. Started on heparin and admitted for further management of hypoxia.      Merrily Pew, MD 02/21/15 1225

## 2015-02-20 NOTE — Progress Notes (Addendum)
ANTICOAGULATION CONSULT NOTE - Initial Consult  Pharmacy Consult for Heparin Indication: R/O PE  No Known Allergies  Patient Measurements: Weight: (!) 400 lb (181.439 kg) Heparin Dosing Weight:   Vital Signs: Temp: 97.8 F (36.6 C) (11/29 1628) Temp Source: Oral (11/29 1628) BP: 176/87 mmHg (11/29 2224) Pulse Rate: 82 (11/29 2224)  Labs:  Recent Labs  02/20/15 1639 02/20/15 1756  HGB 13.2  --   HCT 43.9  --   PLT 184  --   CREATININE 1.51*  --   TROPONINI  --  0.04*    Estimated Creatinine Clearance: 93.1 mL/min (by C-G formula based on Cr of 1.51).   Medical History: Past Medical History  Diagnosis Date  . Hypertension   . Gout   . OSA on CPAP   . Morbid obesity ([place])   . Pulmonary embolism ([place])   . PNA (pneumonia)     Medications:  Infusions:  . heparin      Assessment: Patient with history of PE.  BLE dopplers negative for DVT, but per MD, chest CT could not rule out PE.  MD wants heparin per pharmacy, will follow up with VQ scan.  Goal of Therapy:  Heparin level 0.3-0.7 units/ml Monitor platelets by anticoagulation protocol: Yes   Plan:  Heparin bolus 4000  units iv x1 Heparin drip at 2200 units/hr Daily CBC Next heparin level at 0800    Phillip [name] 02/20/2015,11:04 PM

## 2015-02-20 NOTE — Progress Notes (Signed)
VASCULAR LAB PRELIMINARY  PRELIMINARY  PRELIMINARY  PRELIMINARY  Bilateral lower extremity venous duplex  completed.    Preliminary report:  Bilateral:  No evidence of DVT, superficial thrombosis, or Baker's Cyst.    Lesly Pontarelli, RVT 02/20/2015, 8:32 PM

## 2015-02-20 NOTE — ED Notes (Signed)
Pt reports SOB for the past week. Pt had a period of immobility after a long ride. Hx of PNA and PE. Pt O2 sat 86% on RA. Pt placed on 2L [place]. Pt reports he has bilateral leg swelling, worse on L side.

## 2015-02-20 NOTE — H&P (Signed)
[facility] History and Physical  Phillip [name] [id number] DOB: 08/16/1960 DOA: 02/20/2015  Referring physician: ED physician PCP: [name], MD  Specialists:   Chief Complaint: Shortness of breath, asymmetric leg edema  HPI: Phillip [name] is a 54 y.o. male with PMH of hypertension, pulmonary embolism 04/24/2008, OSA on CPAP, obesity,CKD-III, gout, who presents with shortness of breath, asymmetric leg edema.  Patient reports that he has been having shortness of breath about one week. He does not have fever, use, chest pain. He has a mild dry cough, no wheezing. P 1atient also reports having mild leg edema (left is worse than the right). Patient had period of immobility (to [place] from to [place], 3-hour driving) recently. Patient does not have abdominal pain, diarrhea, symptoms of UTI, unilateral weakness, rashes. Patient was found to have oxygen desaturation to 86% on room air, and further to 76% on ambulation in emergency room.  In ED, patient was found to have negative lower extremity venous Doppler for DVT, elevated troponin 0.04, WBC 4.7, temperature normal, no tachycardia, potassium 3.4, stable renal function, negative chest x-ray, BNP 194.3. CT angiogram of chest is negative for PE, but the test is significantly limited beyond the lobar level due to patient's obesity. Patient is admitted to inpatient for further direction treatment.  Where does patient live?   At home   Can patient participate in ADLs?  Yes    Review of Systems:   General: no fevers, chills, no changes in body weight, has fatigue HEENT: no blurry vision, hearing changes or sore throat Pulm: has dyspnea, coughing, no wheezing CV: no chest pain, palpitations Abd: no nausea, vomiting, abdominal pain, diarrhea, constipation GU: no dysuria, burning on urination, increased urinary frequency, hematuria  Ext: has leg edema Neuro: no unilateral weakness, numbness, or tingling, no vision change or hearing loss Skin: no  rash MSK: No muscle spasm, no deformity, no limitation of range of movement in spin Heme: No easy bruising.  Travel history: No recent long distant travel.  Allergy: No Known Allergies  Past Medical History  Diagnosis Date  . Hypertension   . Gout   . OSA on CPAP   . Morbid obesity ([place])   . Pulmonary embolism ([place])   . PNA (pneumonia)     Past Surgical History  Procedure Laterality Date  . Hernia repair      Social History:  reports that he has never smoked. He does not have any smokeless tobacco history on file. He reports that he does not drink alcohol or use illicit drugs.  Family History:  Family History  Problem Relation Age of Onset  . Diabetes Father   . CAD Father   . Hypertension Other   . Diabetes Other      Prior to Admission medications   Medication Sig Start Date End Date Taking? Authorizing Provider  albuterol (PROVENTIL HFA;VENTOLIN HFA) 108 (90 BASE) MCG/ACT inhaler Inhale 2 puffs into the lungs every 6 (six) hours as needed for shortness of breath. 05/09/14  Yes Modena Jansky, MD  amLODipine (NORVASC) 10 MG tablet Take 10 mg by mouth every morning.    Yes Historical Provider, MD  cloNIDine (CATAPRES) 0.1 MG tablet Take 0.1 mg by mouth every morning.    Yes Historical Provider, MD  colchicine 0.6 MG tablet Take 0.6 mg by mouth daily as needed. For gout   Yes Historical Provider, MD  naproxen sodium (ANAPROX) 220 MG tablet Take 220 mg by mouth 2 (two) times daily as  needed (pain).   Yes Historical Provider, MD  levofloxacin (LEVAQUIN) 750 MG tablet Take 1 tablet (750 mg total) by mouth daily. Start: 05/10/2014 Patient not taking: Reported on 12/04/2014 05/09/14   Modena Jansky, MD    Physical Exam: Filed Vitals:   02/20/15 1743 02/20/15 1830 02/20/15 2042 02/20/15 2224  BP:  126/64 164/93 176/87  Pulse:  69 76 82  Temp:      TempSrc:      Resp:  13 20 22   Weight: 181.439 kg (400 lb)     SpO2:  97% 91% 93%   General: Not in acute  distress HEENT:       Eyes: PERRL, EOMI, no scleral icterus.       ENT: No discharge from the ears and nose, no pharynx injection, no tonsillar enlargement.        Neck: No JVD, no bruit, no mass felt. Heme: No neck lymph node enlargement. Cardiac: S1/S2, RRR, No murmurs, No gallops or rubs. Pulm: No rales, wheezing, rhonchi or rubs. Abd: Soft, nondistended, nontender, no rebound pain, no organomegaly, BS present. Ext: 1+ pitting leg edema in the left leg and trace leg edema in the R leg. 2+DP/PT pulse bilaterally. Musculoskeletal: No joint deformities, No joint redness or warmth, no limitation of ROM in spin. Skin: No rashes.  Neuro: Alert, oriented X3, cranial nerves II-XII grossly intact, muscle strength 5/5 in all extremities, sensation to light touch intact.  Psych: Patient is not psychotic, no suicidal or hemocidal ideation.  Labs on Admission:  Basic Metabolic Panel:  Recent Labs Lab 02/20/15 1639  NA 140  K 3.4*  CL 97*  CO2 35*  GLUCOSE 198*  BUN 14  CREATININE 1.51*  CALCIUM 8.9   Liver Function Tests: No results for input(s): AST, ALT, ALKPHOS, BILITOT, PROT, ALBUMIN in the last 168 hours. No results for input(s): LIPASE, AMYLASE in the last 168 hours. No results for input(s): AMMONIA in the last 168 hours. CBC:  Recent Labs Lab 02/20/15 1639  WBC 4.7  HGB 13.2  HCT 43.9  MCV 89.4  PLT 184   Cardiac Enzymes:  Recent Labs Lab 02/20/15 1756  TROPONINI 0.04*    BNP (last 3 results)  Recent Labs  05/06/14 2128 12/04/14 0538 02/20/15 1757  BNP 125.6* 185.8* 194.3*    ProBNP (last 3 results) No results for input(s): PROBNP in the last 8760 hours.  CBG: No results for input(s): GLUCAP in the last 168 hours.  Radiological Exams on Admission: Dg Chest 2 View  02/20/2015  CLINICAL DATA:  Shortness of breath for 3 months worse in past 2 weeks, shortness of breath after normal every day activities, history obesity, pulmonary embolism, pneumonia,  bronchitis, hypertension EXAM: CHEST  2 VIEW COMPARISON:  12/04/2014 FINDINGS: Enlargement of cardiac silhouette. Mediastinal contours and pulmonary vascularity normal. Lungs clear. No pleural effusion or pneumothorax. Bones unremarkable. IMPRESSION: Mild enlargement of cardiac silhouette. No acute abnormalities. Electronically Signed   By: Lavonia Dana M.D.   On: 02/20/2015 17:46   Ct Angio Chest Pe W/cm &/or Wo Cm  02/20/2015  CLINICAL DATA:  Shortness of breath since last week. History of pneumonia and pulmonary embolism. EXAM: CT ANGIOGRAPHY CHEST WITH CONTRAST TECHNIQUE: Multidetector CT imaging of the chest was performed using the standard protocol during bolus administration of intravenous contrast. Multiplanar CT image reconstructions and MIPs were obtained to evaluate the vascular anatomy. CONTRAST:  139mL OMNIPAQUE IOHEXOL 350 MG/ML SOLN COMPARISON:  05/06/2014 FINDINGS: THORACIC INLET/BODY WALL: No acute  abnormality. MEDIASTINUM: Mild cardiomegaly. No pericardial effusion. Suboptimal opacification of pulmonary arteries due to patient size and bolus dispersion. There is also intermittent respiratory motion. No evidence of pulmonary embolism with certainty mainly limited to the lobar level and proximal. Negative aorta. No adenopathy. LUNG WINDOWS: No consolidation. No effusion. 5 mm right middle lobe pulmonary nodule is stable over multiple years and benign. UPPER ABDOMEN: No acute findings. OSSEOUS: No acute fracture.  No suspicious lytic or blastic lesions. Review of the MIP images confirms the above findings. IMPRESSION: No evidence of pulmonary embolism, but significantly limited beyond the lobar level due to patient factors. Consider lower extremity Doppler in this patient with reported leg swelling. Electronically Signed   By: Monte Fantasia M.D.   On: 02/20/2015 20:02    EKG: Independently reviewed. QTC 470, T-wave inversion in lead I/aVL, which is existed in previous EKG on 05/06/14,  LAD   Assessment/Plan Principal Problem:   Acute respiratory failure (HCC) Active Problems:   OSA on CPAP   Hypoxia   Hypokalemia   CKD (chronic kidney disease), stage III   Hypertension   Gout   Elevated troponin   SOB (shortness of breath)   Leg edema  Acute respiratory failure ([place]): etiology is not clear. No pneumonia by chest x-ray. CT angiogram is negative for PE, but study is very limited due to patient's body habitus. Given his history of pulmonary embolism, this possibility cannot be ruled out completely. Another potential differential diagnosis congestive heart failure given bilateral leg edema, but his BNP is 194 and no pulmonary edema by chest x-ray, making this diagnosis less likely.  -will admit tele bed -Start IV heparin empirically for possible PE -V/Q in AM -prn albuterol Nebs for SOB  Elevated troponin: no chest pain. No new EKG change. Likely due to demanding ischemia 2/2 elevated bp (199/105). - cycle CE q6 x3 and repeat her EKG in the am  - Nitroglycerin, Morphine, and aspirin, lipitor  - Risk factor stratification: will check FLP and A1C  - Consider cardiology consult if test positive for CEs  - 2d echo  OSA - on CPAP  Mild Hypokalemia: Potassium is 3.4. -Follow-up by the map  CKD (chronic kidney disease), stage III: Baseline creatinine 1.3-1.7. His creatinine is 1.51, BUN 14, which is at baseline. -Follow-up renal function by BMP  Hypertension: bp =199/105 -amlodipine -increase clonidine frequency from 0.1 mg daily 2 twice daily -IV hydralazine prn -0.2 mg of nitroglycerin patch 1  Gout: stable.used to be on allopurinol. Currently not taking this medication. -hold allopurinol  DVT ppx: SQ Heparin    Code Status: Full code Family Communication: None at bed side.    Disposition Plan: Admit to inpatient   Date of Service 02/20/2015    Ivor Costa [facility] Pager [phone]  If 7PM-7AM, please contact  night-coverage [email or web address] Password [email or web address] 02/20/2015, 11:15 PM

## 2015-02-21 ENCOUNTER — Encounter (HOSPITAL_COMMUNITY): Payer: Self-pay | Admitting: *Deleted

## 2015-02-21 ENCOUNTER — Inpatient Hospital Stay (HOSPITAL_COMMUNITY): Payer: Managed Care, Other (non HMO)

## 2015-02-21 DIAGNOSIS — I10 Essential (primary) hypertension: Secondary | ICD-10-CM

## 2015-02-21 DIAGNOSIS — R0600 Dyspnea, unspecified: Secondary | ICD-10-CM

## 2015-02-21 DIAGNOSIS — J9621 Acute and chronic respiratory failure with hypoxia: Secondary | ICD-10-CM

## 2015-02-21 DIAGNOSIS — G4733 Obstructive sleep apnea (adult) (pediatric): Secondary | ICD-10-CM

## 2015-02-21 DIAGNOSIS — Z9981 Dependence on supplemental oxygen: Secondary | ICD-10-CM

## 2015-02-21 DIAGNOSIS — R600 Localized edema: Secondary | ICD-10-CM

## 2015-02-21 LAB — CBC
HEMATOCRIT: 45 % (ref 39.0–52.0)
HEMOGLOBIN: 13.7 g/dL (ref 13.0–17.0)
MCH: 27.3 pg (ref 26.0–34.0)
MCHC: 30.4 g/dL (ref 30.0–36.0)
MCV: 89.8 fL (ref 78.0–100.0)
PLATELETS: 196 10*3/uL (ref 150–400)
RBC: 5.01 MIL/uL (ref 4.22–5.81)
RDW: 13.8 % (ref 11.5–15.5)
WBC: 5.9 10*3/uL (ref 4.0–10.5)

## 2015-02-21 LAB — COMPREHENSIVE METABOLIC PANEL
ALBUMIN: 3.8 g/dL (ref 3.5–5.0)
ALK PHOS: 74 U/L (ref 38–126)
ALT: 18 U/L (ref 17–63)
ANION GAP: 3 — AB (ref 5–15)
AST: 19 U/L (ref 15–41)
BUN: 17 mg/dL (ref 6–20)
CALCIUM: 8.8 mg/dL — AB (ref 8.9–10.3)
CHLORIDE: 99 mmol/L — AB (ref 101–111)
CO2: 38 mmol/L — AB (ref 22–32)
Creatinine, Ser: 1.4 mg/dL — ABNORMAL HIGH (ref 0.61–1.24)
GFR calc non Af Amer: 56 mL/min — ABNORMAL LOW (ref >60)
GLUCOSE: 149 mg/dL — AB (ref 65–99)
POTASSIUM: 4 mmol/L (ref 3.5–5.1)
SODIUM: 140 mmol/L (ref 135–145)
Total Bilirubin: 0.6 mg/dL (ref 0.3–1.2)
Total Protein: 6.8 g/dL (ref 6.5–8.1)

## 2015-02-21 LAB — TROPONIN I: Troponin I: <0.03 ng/mL (ref <0.031)

## 2015-02-21 LAB — LIPID PANEL
Cholesterol: 175 mg/dL (ref 0–200)
HDL: 66 mg/dL (ref >40)
LDL CALC: 102 mg/dL — AB (ref 0–99)
Total CHOL/HDL Ratio: 2.7 RATIO
Triglycerides: 37 mg/dL (ref <150)
VLDL: 7 mg/dL (ref 0–40)

## 2015-02-21 LAB — URINALYSIS, ROUTINE W REFLEX MICROSCOPIC
Bilirubin Urine: NEGATIVE
GLUCOSE, UA: NEGATIVE mg/dL
HGB URINE DIPSTICK: NEGATIVE
Ketones, ur: NEGATIVE mg/dL
LEUKOCYTES UA: NEGATIVE
Nitrite: NEGATIVE
PROTEIN: NEGATIVE mg/dL
SPECIFIC GRAVITY, URINE: 1.018 (ref 1.005–1.030)
pH: 5 (ref 5.0–8.0)

## 2015-02-21 LAB — HEPARIN LEVEL (UNFRACTIONATED): Heparin Unfractionated: 1.12 IU/mL — ABNORMAL HIGH (ref 0.30–0.70)

## 2015-02-21 LAB — RAPID URINE DRUG SCREEN, HOSP PERFORMED
AMPHETAMINES: NOT DETECTED
BARBITURATES: NOT DETECTED
Benzodiazepines: NOT DETECTED
Cocaine: NOT DETECTED
Opiates: NOT DETECTED
TETRAHYDROCANNABINOL: NOT DETECTED

## 2015-02-21 LAB — HIV ANTIBODY (ROUTINE TESTING W REFLEX): HIV SCREEN 4TH GENERATION: NONREACTIVE

## 2015-02-21 MED ORDER — FUROSEMIDE 10 MG/ML IJ SOLN
40.0000 mg | Freq: Once | INTRAMUSCULAR | Status: AC
  Administered 2015-02-21: 40 mg via INTRAVENOUS
  Filled 2015-02-21: qty 4

## 2015-02-21 MED ORDER — TECHNETIUM TC 99M DIETHYLENETRIAME-PENTAACETIC ACID: 32.0000 | Freq: Once | INTRAVENOUS | Status: DC | PRN

## 2015-02-21 MED ORDER — ASPIRIN 81 MG PO CHEW
81.0000 mg | CHEWABLE_TABLET | Freq: Every day | ORAL | Status: DC
  Administered 2015-02-22: 81 mg via ORAL
  Filled 2015-02-21: qty 1

## 2015-02-21 MED ORDER — ALBUTEROL SULFATE (2.5 MG/3ML) 0.083% IN NEBU: 2.5000 mg | INHALATION_SOLUTION | Freq: Four times a day (QID) | RESPIRATORY_TRACT | Status: DC | PRN

## 2015-02-21 MED ORDER — HEPARIN (PORCINE) IN NACL 100-0.45 UNIT/ML-% IJ SOLN
1800.0000 [IU]/h | INTRAMUSCULAR | Status: DC
Start: 2015-02-21 — End: 2015-02-21
  Filled 2015-02-21: qty 250

## 2015-02-21 MED ORDER — HEPARIN (PORCINE) IN NACL 100-0.45 UNIT/ML-% IJ SOLN
1800.0000 [IU]/h | INTRAMUSCULAR | Status: DC
  Filled 2015-02-21: qty 250

## 2015-02-21 MED ORDER — PERFLUTREN LIPID MICROSPHERE
1.0000 mL | INTRAVENOUS | Status: AC | PRN
  Filled 2015-02-21: qty 10

## 2015-02-21 MED ORDER — NITROGLYCERIN 0.2 MG/HR TD PT24
0.2000 mg | MEDICATED_PATCH | Freq: Once | TRANSDERMAL | Status: AC
  Administered 2015-02-21: 0.2 mg via TRANSDERMAL
  Filled 2015-02-21: qty 1

## 2015-02-21 MED ORDER — TECHNETIUM TO 99M ALBUMIN AGGREGATED: 4.0000 | Freq: Once | INTRAVENOUS | Status: DC | PRN

## 2015-02-21 NOTE — Progress Notes (Signed)
PT Cancellation Note  Patient Details Name: Phillip [name] MRN: [id number] DOB: [birth year]   Cancelled Treatment:    Reason Eval/Treat Not Completed: Patient at procedure or test/unavailable (pt at radiology. Will follow. )   Philomena Doheny 02/21/2015, 9:37 AM [phone]

## 2015-02-21 NOTE — Progress Notes (Signed)
PT Cancellation Note  Patient Details Name: Phillip [name] MRN: [id number] DOB: 1960-08-02   Cancelled Treatment:    Reason Eval/Treat Not Completed: Patient at procedure or test/unavailable. Pt still gone for test. Will see tomorrow.    Blondell Reveal Kistler 02/21/2015, 1:58 PM [phone]

## 2015-02-21 NOTE — Progress Notes (Signed)
PROGRESS NOTE  Phillip [name] [id number] DOB: Apr 25, 1960 DOA: 02/20/2015 PCP: [name], MD  HPI/Recap of past 24 hours: Talking in full sentences, NAD, still some edema bilateral lower extremity, echo pending,   Assessment/Plan: Principal Problem:   Acute respiratory failure ([place]) Active Problems:   OSA on CPAP   Hypoxia   Hypokalemia   CKD (chronic kidney disease), stage III   Hypertension   Gout   Elevated troponin   SOB (shortness of breath)   Leg edema  Acute on chronic hypoxia respiratory failure [facility]):  Patient has been on home oxygen on prn basis for the last few years,  CT angiogram is negative for PE, but study is very limited due to patient's body habitus.  Given his history of pulmonary embolism, this possibility cannot be ruled out completely. He was started on heparin drip, this was stopped after V/q scan with low probability and venous US no DVT.   Another potential differential diagnosis congestive heart failure given bilateral leg edema, BNP 194 but in obese individual this is not reliable,  no pulmonary edema by chest x-ray, patient admit he has increase salt intake recently, noticed weight gain, nocturnal cough. Echo pending ,start iv lasix   Elevated troponin: first troponin 0.04, rest of troponin <0.03 no chest pain. No new EKG change. Likely due to demanding ischemia 2/2 elevated bp (199/105). -  Nitroglycerin, Morphine, and aspirin, lipitor (patient was not on asa or statin PTA) - Risk factor stratification: FLP with ldl 102,  A1C pending  -  2d echo pending  obesity/OSA - noncompliant with CPAP Life style modification  Mild Hypokalemia: Potassium is 3.4. -Follow-up by the map  CKD (chronic kidney disease), stage III: Baseline creatinine 1.3-1.7. His creatinine is 1.51, BUN 14, which is at baseline. -Follow-up renal function by BMP  Hypertension: bp =199/105 -amlodipine -increase clonidine frequency from 0.1 mg daily 2 twice daily -IV  hydralazine prn -0.2 mg of nitroglycerin patch 1 -bp better, advised patient to take daily asa 81mg   Gout: stable.used to be on allopurinol. Currently not taking this medication. -hold allopurinol, check uric acid  DVT ppx: SQ Heparin   Code Status: full  Family Communication: patient   Disposition Plan: home when medically stable, likely in 1-2 days   Consultants:  none  Procedures:  none  Antibiotics:  none   Objective: BP 148/86 mmHg  Pulse 63  Temp(Src) 98.1 F (36.7 C) (Oral)  Resp 20  Ht 5\' 6"  (1.676 m)  Wt 414 lb 1.6 oz (187.835 kg)  BMI 66.87 kg/m2  SpO2 94%  Intake/Output Summary (Last 24 hours) at 02/21/15 1215 Last data filed at 02/21/15 0855  Gross per 24 hour  Intake      0 ml  Output    500 ml  Net   -500 ml   Filed Weights   02/20/15 1743 02/21/15 0016  Weight: 400 lb (181.439 kg) 414 lb 1.6 oz (187.835 kg)    Exam:   General:  NAD, obesity  Cardiovascular: RRR  Respiratory: CTABL  Abdomen: Soft/ND/NT, positive BS  Musculoskeletal: bilateral lower extremity pitting Edema  Neuro: aaox3, no focal deficit  Data Reviewed: Basic Metabolic Panel:  Recent Labs Lab 02/20/15 1639 02/21/15 0431  NA 140 140  K 3.4* 4.0  CL 97* 99*  CO2 35* 38*  GLUCOSE 198* 149*  BUN 14 17  CREATININE 1.51* 1.40*  CALCIUM 8.9 8.8*   Liver Function Tests:  Recent Labs Lab 02/21/15 0431  AST  19  ALT 18  ALKPHOS 74  BILITOT 0.6  PROT 6.8  ALBUMIN 3.8   No results for input(s): LIPASE, AMYLASE in the last 168 hours. No results for input(s): AMMONIA in the last 168 hours. CBC:  Recent Labs Lab 02/20/15 1639 02/21/15 0500  WBC 4.7 5.9  HGB 13.2 13.7  HCT 43.9 45.0  MCV 89.4 89.8  PLT 184 196   Cardiac Enzymes:    Recent Labs Lab 02/20/15 1756 02/20/15 2311 02/21/15 0431 02/21/15 1120  TROPONINI 0.04* 0.03 <0.03 <0.03   BNP (last 3 results)  Recent Labs  05/06/14 2128 12/04/14 0538 02/20/15 1757  BNP 125.6*  185.8* 194.3*    ProBNP (last 3 results) No results for input(s): PROBNP in the last 8760 hours.  CBG: No results for input(s): GLUCAP in the last 168 hours.  No results found for this or any previous visit (from the past 240 hour(s)).   Studies: Dg Chest 2 View  02/20/2015  CLINICAL DATA:  Shortness of breath for 3 months worse in past 2 weeks, shortness of breath after normal every day activities, history obesity, pulmonary embolism, pneumonia, bronchitis, hypertension EXAM: CHEST  2 VIEW COMPARISON:  12/04/2014 FINDINGS: Enlargement of cardiac silhouette. Mediastinal contours and pulmonary vascularity normal. Lungs clear. No pleural effusion or pneumothorax. Bones unremarkable. IMPRESSION: Mild enlargement of cardiac silhouette. No acute abnormalities. Electronically Signed   By: Lavonia Dana M.D.   On: 02/20/2015 17:46   Ct Angio Chest Pe W/cm &/or Wo Cm  02/20/2015  CLINICAL DATA:  Shortness of breath since last week. History of pneumonia and pulmonary embolism. EXAM: CT ANGIOGRAPHY CHEST WITH CONTRAST TECHNIQUE: Multidetector CT imaging of the chest was performed using the standard protocol during bolus administration of intravenous contrast. Multiplanar CT image reconstructions and MIPs were obtained to evaluate the vascular anatomy. CONTRAST:  18mL OMNIPAQUE IOHEXOL 350 MG/ML SOLN COMPARISON:  05/06/2014 FINDINGS: THORACIC INLET/BODY WALL: No acute abnormality. MEDIASTINUM: Mild cardiomegaly. No pericardial effusion. Suboptimal opacification of pulmonary arteries due to patient size and bolus dispersion. There is also intermittent respiratory motion. No evidence of pulmonary embolism with certainty mainly limited to the lobar level and proximal. Negative aorta. No adenopathy. LUNG WINDOWS: No consolidation. No effusion. 5 mm right middle lobe pulmonary nodule is stable over multiple years and benign. UPPER ABDOMEN: No acute findings. OSSEOUS: No acute fracture.  No suspicious lytic or  blastic lesions. Review of the MIP images confirms the above findings. IMPRESSION: No evidence of pulmonary embolism, but significantly limited beyond the lobar level due to patient factors. Consider lower extremity Doppler in this patient with reported leg swelling. Electronically Signed   By: Monte Fantasia M.D.   On: 02/20/2015 20:02   Nm Pulmonary Perf And Vent  02/21/2015  CLINICAL DATA:  Shortness of breath. EXAM: NUCLEAR MEDICINE VENTILATION - PERFUSION LUNG SCAN TECHNIQUE: Ventilation images were obtained in multiple projections using inhaled aerosol Tc-78m DTPA. Perfusion images were obtained in multiple projections after intravenous injection of Tc-13m MAA. RADIOPHARMACEUTICALS:  32.0 Technetium-15m DTPA aerosol inhalation and 4.0 Technetium-18m MAA IV COMPARISON:  Chest x-ray 02/20/2015.  Chest CT 02/20/2015 FINDINGS: Elevation of the right hemidiaphragm again noted. Minimal ventilatory defects noted, possibly related to mild atelectasis. No perfusion defects noted. IMPRESSION: Low probability scan. No evidence of pulmonary embolus. No perfusion defects noted. Minimal ventilation defects noted, possibly related to mild atelectasis . Electronically Signed   By: [place]   On: 02/21/2015 10:47    Scheduled Meds: . amLODipine  10  mg Oral q morning - 10a  . [START ON 02/22/2015] aspirin  81 mg Oral Daily  . atorvastatin  40 mg Oral q1800  . cloNIDine  0.1 mg Oral BID  . furosemide  40 mg Intravenous Once  . guaiFENesin  600 mg Oral BID  . nitroGLYCERIN  0.2 mg Transdermal Once  . sodium chloride  3 mL Intravenous Q12H    Continuous Infusions:    Time spent: 53mins  Renia Mikelson MD, PhD  [facility] Pager [phone]. If 7PM-7AM, please contact night-coverage at [email or web address], password [facility] 02/21/2015, 12:15 PM  LOS: 1 day

## 2015-02-21 NOTE — Progress Notes (Signed)
  Echocardiogram 2D Echocardiogram has been performed.  Phillip [name] 02/21/2015, 5:11 PM

## 2015-02-21 NOTE — Progress Notes (Signed)
ANTICOAGULATION CONSULT NOTE - Follow Up Consult  Pharmacy Consult for Heparin Indication: r/o PE  No Known Allergies  Patient Measurements: Height: 5\' 6"  (167.6 cm) Weight: (!) 414 lb 1.6 oz (187.835 kg) IBW/kg (Calculated) : 63.8 Heparin Dosing Weight: 112 kg  Vital Signs: Temp: 98.1 F (36.7 C) (11/30 0605) Temp Source: Oral (11/30 0605) BP: 121/81 mmHg (11/30 0605) Pulse Rate: 63 (11/30 0605)  Labs:  Recent Labs  02/20/15 1639 02/20/15 1756 02/20/15 2311 02/21/15 0431 02/21/15 0500 02/21/15 0802  HGB 13.2  --   --   --  13.7  --   HCT 43.9  --   --   --  45.0  --   PLT 184  --   --   --  196  --   APTT  --   --  35  --   --   --   LABPROT  --   --  13.6  --   --   --   INR  --   --  1.02  --   --   --   HEPARINUNFRC  --   --   --   --   --  1.12*  CREATININE 1.51*  --   --  1.40*  --   --   TROPONINI  --  0.04* 0.03 <0.03  --   --     Estimated Creatinine Clearance: 99 mL/min (by C-G formula based on Cr of 1.4).   Assessment: 35 yoM with PMH of HTN, h/o of PE 04/24/2008, OSA on CPAP, obesity, CKD-III, gout, who presents with shortness of breath, asymmetric leg edema.  Admitted for r/o PE.  CT chest unable to completely rule out.  Dopplers and VQ scan pending.  Started on heparin infusion.  Today, 02/21/2015: - First heparin level supratherapeutic at 1.12 with infusion at 2200 units/hr and 4000 unit bolus - CBC WNL - No bleeding/complications reported.   Goal of Therapy:  Heparin level 0.3-0.7 units/ml Monitor platelets by anticoagulation protocol: Yes   Plan:  1.  Hold heparin infusion for 1 hour then resume at reduced rate of 1800 units/hr. 2.  Check heparin level 6 hours after resuming infusion. 3.  F/u results of PE work-up. 4.  Daily heparin level and CBC while on heparin infusion.  Hershal Coria 02/21/2015,9:12 AM

## 2015-02-21 NOTE — Care Management Note (Signed)
Case Management Note  Patient Details  Name: Renay Langenberg MRN: [id number] Date of Birth: 1960-10-24  Subjective/Objective:    54 y/o m admitted w/Acute respiratory failure.From home.PT cons-await recc.                Action/Plan:d/c plan home.   Expected Discharge Date:                  Expected Discharge Plan:  Home/Self Care  In-House Referral:     Discharge planning Services  CM Consult  Post Acute Care Choice:    Choice offered to:     DME Arranged:    DME Agency:     HH Arranged:    HH Agency:     Status of Service:  In process, will continue to follow  Medicare Important Message Given:    Date Medicare IM Given:    Medicare IM give by:    Date Additional Medicare IM Given:    Additional Medicare Important Message give by:     If discussed at [place] of Stay Meetings, dates discussed:    Additional Comments:  Dessa Phi, RN 02/21/2015, 2:22 PM

## 2015-02-22 DIAGNOSIS — R7989 Other specified abnormal findings of blood chemistry: Secondary | ICD-10-CM

## 2015-02-22 DIAGNOSIS — I5023 Acute on chronic systolic (congestive) heart failure: Secondary | ICD-10-CM

## 2015-02-22 DIAGNOSIS — N183 Chronic kidney disease, stage 3 (moderate): Secondary | ICD-10-CM

## 2015-02-22 LAB — HEMOGLOBIN A1C
HEMOGLOBIN A1C: 6.1 % — AB (ref 4.8–5.6)
MEAN PLASMA GLUCOSE: 128 mg/dL

## 2015-02-22 LAB — BASIC METABOLIC PANEL
ANION GAP: 6 (ref 5–15)
BUN: 25 mg/dL — ABNORMAL HIGH (ref 6–20)
CHLORIDE: 98 mmol/L — AB (ref 101–111)
CO2: 37 mmol/L — AB (ref 22–32)
Calcium: 8.8 mg/dL — ABNORMAL LOW (ref 8.9–10.3)
Creatinine, Ser: 1.67 mg/dL — ABNORMAL HIGH (ref 0.61–1.24)
GFR calc non Af Amer: 46 mL/min — ABNORMAL LOW (ref >60)
GFR, EST AFRICAN AMERICAN: 53 mL/min — AB (ref >60)
Glucose, Bld: 112 mg/dL — ABNORMAL HIGH (ref 65–99)
Potassium: 3.8 mmol/L (ref 3.5–5.1)
Sodium: 141 mmol/L (ref 135–145)

## 2015-02-22 LAB — MAGNESIUM: Magnesium: 1.8 mg/dL (ref 1.7–2.4)

## 2015-02-22 LAB — URIC ACID: Uric Acid, Serum: 8.4 mg/dL — ABNORMAL HIGH (ref 4.4–7.6)

## 2015-02-22 LAB — PSA: PSA: 0.38 ng/mL (ref 0.00–4.00)

## 2015-02-22 MED ORDER — ASPIRIN 81 MG PO CHEW: 81.0000 mg | CHEWABLE_TABLET | Freq: Every day | ORAL | Status: DC

## 2015-02-22 MED ORDER — FUROSEMIDE 40 MG PO TABS: 40.0000 mg | ORAL_TABLET | ORAL | Status: DC

## 2015-02-22 MED ORDER — CARVEDILOL 6.25 MG PO TABS: 6.2500 mg | ORAL_TABLET | Freq: Two times a day (BID) | ORAL | Status: DC

## 2015-02-22 MED ORDER — ATORVASTATIN CALCIUM 40 MG PO TABS: 40.0000 mg | ORAL_TABLET | Freq: Every day | ORAL | Status: DC

## 2015-02-22 MED ORDER — FEBUXOSTAT 40 MG PO TABS: 40.0000 mg | ORAL_TABLET | Freq: Every day | ORAL | Status: DC

## 2015-02-22 NOTE — Discharge Summary (Addendum)
Discharge Summary  Phillip [name] [id number] DOB: [birth year]  PCP: [name], MD  Admit date: 02/20/2015 Discharge date: 02/22/2015  Time spent: <6mins  Recommendations for Outpatient Follow-up:  1. F/u with PMD within a week for hospital discharge followup, repeat cbc/bmp at follow up 2. Establish care with [place] [facility]ology for chf 3. New meds: coreg/lasix/asa/lipitor/uloric  Discharge Diagnoses:  Active Hospital Problems   Diagnosis Date Noted  . Acute respiratory failure ([place]) 02/20/2015  . Elevated troponin 02/20/2015  . SOB (shortness of breath) 02/20/2015  . Leg edema 02/20/2015  . Hypertension   . Gout   . CKD (chronic kidney disease), stage III 05/06/2014  . Hypokalemia 05/06/2014  . OSA on CPAP 05/06/2014  . Hypoxia 05/06/2014    Resolved Hospital Problems   Diagnosis Date Noted Date Resolved  No resolved problems to display.    Discharge Condition: stable  Diet recommendation: heart healthy/carb modified  Filed Weights   02/20/15 1743 02/21/15 0016  Weight: 400 lb (181.439 kg) 414 lb 1.6 oz (187.835 kg)    History of present illness:  Phillip [name] is a 54 y.o. male with PMH of hypertension, pulmonary embolism 04/24/2008, OSA on CPAP, obesity,CKD-III, gout, who presents with shortness of breath, asymmetric leg edema.  Patient reports that he has been having shortness of breath about one week. He does not have fever, use, chest pain. He has a mild dry cough, no wheezing. P 1atient also reports having mild leg edema (left is worse than the right). Patient had period of immobility (to and from to [place], 3-hour driving) recently. Patient does not have abdominal pain, diarrhea, symptoms of UTI, unilateral weakness, rashes. Patient was found to have oxygen desaturation to 86% on room air, and further to 76% on ambulation in emergency room.  In ED, patient was found to have negative lower extremity venous Doppler for DVT, elevated troponin 0.04, WBC 4.7,  temperature normal, no tachycardia, potassium 3.4, stable renal function, negative chest x-ray, BNP 194.3. CT angiogram of chest is negative for PE, but the test is significantly limited beyond the lobar level due to patient's obesity. Patient is admitted to inpatient for further direction treatment.  Hospital Course:  Principal Problem:   Acute respiratory failure (H[facility]) Active Problems:   OSA on CPAP   Hypoxia   Hypokalemia   CKD (chronic kidney disease), stage III   Hypertension   Gout   Elevated troponin   SOB (shortness of breath)   Leg edema  Acute on chronic hypoxia respiratory failure [facility]):  Patient has been on home oxygen on prn basis for the last few years,  CT angiogram is negative for PE, but study is very limited due to patient's body habitus.  Given his history of pulmonary embolism, this possibility cannot be ruled out completely. He was started on heparin drip, this was stopped after V/q scan with low probability and venous US no DVT.  likely from congestive heart failure given bilateral leg edema, BNP 194 but in obese individual this is not reliable, no pulmonary edema by chest x-ray, patient admit he has increase salt intake recently, noticed weight gain, nocturnal cough. Echo EF 40-45%  ,improved with iv lasix Discharge home with oral lasix every other day, coreg  Acute on chronic systolic chf: new diagnose. Ef 40-45%. Started lasix/coreg, not started acei due to elevation of cr. Outpatient pmd and cardiology follow up, repeat bmp.  Elevated troponin: first troponin 0.04, rest of troponin <0.03 no chest pain. No new EKG  change. Likely due to demanding ischemia 2/2 elevated bp (199/105). - Nitroglycerin, Morphine, and aspirin, lipitor (patient was not on asa or statin PTA) - Risk factor stratification: FLP with ldl 102, A1C 6.1 - 2d echo ef 40 -45% Discharged home with asa/lipitor/coreg, outpatient cardiology follow up.  obesity/OSA - noncompliant with  CPAP Life style modification  Mild Hypokalemia: Potassium is 3.4. -replaced  CKD (chronic kidney disease), stage III: Baseline creatinine 1.3-1.7. His creatinine is 1.51, BUN 14, which is at baseline. -renal dosing meds, repeat bmp at hospital discharge follow up  Hypertension: bp =199/105 on admission -continue amlodipine -newly started on coreg, d/c clonidine -bp better, advised patient to take daily asa 81mg   Gout: stable.used to be on allopurinol. Currently not taking this medication. -hold allopurinol,  uric acid 8.4 Start uloric due to elevated cr level.    Code Status: full  Family Communication: patient   Disposition Plan: home 12/1   Consultants:  none  Procedures:  none  Antibiotics: none  Discharge Exam: BP 156/92 mmHg  Pulse 78  Temp(Src) 98 F (36.7 C) (Oral)  Resp 20  Ht 5\' 6"  (1.676 m)  Wt 414 lb 1.6 oz (187.835 kg)  BMI 66.87 kg/m2  SpO2 98%   General: NAD, obesity  Cardiovascular: RRR  Respiratory: CTABL  Abdomen: Soft/ND/NT, positive BS  Musculoskeletal: bilateral lower extremity pitting Edema has subsided  Neuro: aaox3, no focal deficit   Discharge Instructions You were cared for by a hospitalist during your hospital stay. If you have any questions about your discharge medications or the care you received while you were in the hospital after you are discharged, you can call the unit and asked to speak with the hospitalist on call if the hospitalist that took care of you is not available. Once you are discharged, your primary care physician will handle any further medical issues. Please note that NO REFILLS for any discharge medications will be authorized once you are discharged, as it is imperative that you return to your primary care physician (or establish a relationship with a primary care physician if you do not have one) for your aftercare needs so that they can reassess your need for medications and monitor your lab  values.  Discharge Instructions    Diet - low sodium heart healthy    Complete by:  As directed   Low fat     Increase activity slowly    Complete by:  As directed             Medication List    STOP taking these medications        cloNIDine 0.1 MG tablet  Commonly known as:  CATAPRES     levofloxacin 750 MG tablet  Commonly known as:  LEVAQUIN     naproxen sodium 220 MG tablet  Commonly known as:  ANAPROX      TAKE these medications        albuterol 108 (90 BASE) MCG/ACT inhaler  Commonly known as:  PROVENTIL HFA;VENTOLIN HFA  Inhale 2 puffs into the lungs every 6 (six) hours as needed for shortness of breath.     amLODipine 10 MG tablet  Commonly known as:  NORVASC  Take 10 mg by mouth every morning.     aspirin 81 MG chewable tablet  Chew 1 tablet (81 mg total) by mouth daily.     atorvastatin 40 MG tablet  Commonly known as:  LIPITOR  Take 1 tablet (40 mg total) by mouth daily at  6 PM.     carvedilol 6.25 MG tablet  Commonly known as:  COREG  Take 1 tablet (6.25 mg total) by mouth 2 (two) times daily with a meal.     colchicine 0.6 MG tablet  Take 0.6 mg by mouth daily as needed. For gout     febuxostat 40 MG tablet  Commonly known as:  ULORIC  Take 1 tablet (40 mg total) by mouth daily.     furosemide 40 MG tablet  Commonly known as:  LASIX  Take 1 tablet (40 mg total) by mouth every other day.       No Known Allergies     Follow-up Information    Follow up with [name], MD.   Specialty:  Internal Medicine   Contact information:   [address] [place] [address] [phone]       Follow up with [facility] In 3 weeks.   Specialty:  Cardiology   Why:  for chf   Contact information:   [address] [place] [phone]       The results of significant diagnostics from this hospitalization (including imaging, microbiology, ancillary and laboratory) are  listed below for reference.    Significant Diagnostic Studies: Dg Chest 2 View  02/20/2015  CLINICAL DATA:  Shortness of breath for 3 months worse in past 2 weeks, shortness of breath after normal every day activities, history obesity, pulmonary embolism, pneumonia, bronchitis, hypertension EXAM: CHEST  2 VIEW COMPARISON:  12/04/2014 FINDINGS: Enlargement of cardiac silhouette. Mediastinal contours and pulmonary vascularity normal. Lungs clear. No pleural effusion or pneumothorax. Bones unremarkable. IMPRESSION: Mild enlargement of cardiac silhouette. No acute abnormalities. Electronically Signed   By: Lavonia Dana M.D.   On: 02/20/2015 17:46   Ct Angio Chest Pe W/cm &/or Wo Cm  02/20/2015  CLINICAL DATA:  Shortness of breath since last week. History of pneumonia and pulmonary embolism. EXAM: CT ANGIOGRAPHY CHEST WITH CONTRAST TECHNIQUE: Multidetector CT imaging of the chest was performed using the standard protocol during bolus administration of intravenous contrast. Multiplanar CT image reconstructions and MIPs were obtained to evaluate the vascular anatomy. CONTRAST:  173mL OMNIPAQUE IOHEXOL 350 MG/ML SOLN COMPARISON:  05/06/2014 FINDINGS: THORACIC INLET/BODY WALL: No acute abnormality. MEDIASTINUM: Mild cardiomegaly. No pericardial effusion. Suboptimal opacification of pulmonary arteries due to patient size and bolus dispersion. There is also intermittent respiratory motion. No evidence of pulmonary embolism with certainty mainly limited to the lobar level and proximal. Negative aorta. No adenopathy. LUNG WINDOWS: No consolidation. No effusion. 5 mm right middle lobe pulmonary nodule is stable over multiple years and benign. UPPER ABDOMEN: No acute findings. OSSEOUS: No acute fracture.  No suspicious lytic or blastic lesions. Review of the MIP images confirms the above findings. IMPRESSION: No evidence of pulmonary embolism, but significantly limited beyond the lobar level due to patient factors.  Consider lower extremity Doppler in this patient with reported leg swelling. Electronically Signed   By: Monte Fantasia M.D.   On: 02/20/2015 20:02   Nm Pulmonary Perf And Vent  02/21/2015  CLINICAL DATA:  Shortness of breath. EXAM: NUCLEAR MEDICINE VENTILATION - PERFUSION LUNG SCAN TECHNIQUE: Ventilation images were obtained in multiple projections using inhaled aerosol Tc-5m DTPA. Perfusion images were obtained in multiple projections after intravenous injection of Tc-73m MAA. RADIOPHARMACEUTICALS:  32.0 Technetium-78m DTPA aerosol inhalation and 4.0 Technetium-21m MAA IV COMPARISON:  Chest x-ray 02/20/2015.  Chest CT 02/20/2015 FINDINGS: Elevation of the  right hemidiaphragm again noted. Minimal ventilatory defects noted, possibly related to mild atelectasis. No perfusion defects noted. IMPRESSION: Low probability scan. No evidence of pulmonary embolus. No perfusion defects noted. Minimal ventilation defects noted, possibly related to mild atelectasis . Electronically Signed   By: Marcello Moores  Register   On: 02/21/2015 10:47    Microbiology: No results found for this or any previous visit (from the past 240 hour(s)).   Labs: Basic Metabolic Panel:  Recent Labs Lab 02/20/15 1639 02/21/15 0431 02/22/15 0434  NA 140 140 141  K 3.4* 4.0 3.8  CL 97* 99* 98*  CO2 35* 38* 37*  GLUCOSE 198* 149* 112*  BUN 14 17 25*  CREATININE 1.51* 1.40* 1.67*  CALCIUM 8.9 8.8* 8.8*  MG  --   --  1.8   Liver Function Tests:  Recent Labs Lab 02/21/15 0431  AST 19  ALT 18  ALKPHOS 74  BILITOT 0.6  PROT 6.8  ALBUMIN 3.8   No results for input(s): LIPASE, AMYLASE in the last 168 hours. No results for input(s): AMMONIA in the last 168 hours. CBC:  Recent Labs Lab 02/20/15 1639 02/21/15 0500  WBC 4.7 5.9  HGB 13.2 13.7  HCT 43.9 45.0  MCV 89.4 89.8  PLT 184 196   Cardiac Enzymes:  Recent Labs Lab 02/20/15 1756 02/20/15 2311 02/21/15 0431 02/21/15 1120  TROPONINI 0.04* 0.03 <0.03 <0.03    BNP: BNP (last 3 results)  Recent Labs  05/06/14 2128 12/04/14 0538 02/20/15 1757  BNP 125.6* 185.8* 194.3*    ProBNP (last 3 results) No results for input(s): PROBNP in the last 8760 hours.  CBG: No results for input(s): GLUCAP in the last 168 hours.     SignedFlorencia Reasons MD, PhD  [facility] 02/22/2015, 11:40 AM

## 2015-02-22 NOTE — Progress Notes (Signed)
PT Cancellation Note  Patient Details Name: Phillip [name] MRN: [id number] DOB: 1960-07-29   Cancelled Treatment:    Reason Eval/Treat Not Completed: PT screened, no needs identified, will sign off. Spoke with pt who denied need for PT services. Pt reports he mobilizes without difficulty. Pt states he is ready to d/c home. Will sign off. Thanks.    Weston Anna, MPT Pager: [phone]

## 2015-03-07 ENCOUNTER — Encounter (HOSPITAL_COMMUNITY): Payer: Self-pay | Admitting: Emergency Medicine

## 2015-03-07 ENCOUNTER — Emergency Department (HOSPITAL_COMMUNITY): Payer: Managed Care, Other (non HMO)

## 2015-03-07 ENCOUNTER — Inpatient Hospital Stay (HOSPITAL_COMMUNITY)
Admission: EM | Admit: 2015-03-07 | Discharge: 2015-03-11 | DRG: 292 | Disposition: A | Discharge status: Home / Self Care | Payer: Managed Care, Other (non HMO) | Attending: Internal Medicine | Admitting: Internal Medicine

## 2015-03-07 DIAGNOSIS — R0602 Shortness of breath: Secondary | ICD-10-CM | POA: Diagnosis present

## 2015-03-07 DIAGNOSIS — Z8249 Family history of ischemic heart disease and other diseases of the circulatory system: Secondary | ICD-10-CM

## 2015-03-07 DIAGNOSIS — R079 Chest pain, unspecified: Secondary | ICD-10-CM | POA: Diagnosis present

## 2015-03-07 DIAGNOSIS — G4733 Obstructive sleep apnea (adult) (pediatric): Secondary | ICD-10-CM | POA: Diagnosis present

## 2015-03-07 DIAGNOSIS — Z86711 Personal history of pulmonary embolism: Secondary | ICD-10-CM

## 2015-03-07 DIAGNOSIS — J45909 Unspecified asthma, uncomplicated: Secondary | ICD-10-CM | POA: Diagnosis present

## 2015-03-07 DIAGNOSIS — I130 Hypertensive heart and chronic kidney disease with heart failure and stage 1 through stage 4 chronic kidney disease, or unspecified chronic kidney disease: Secondary | ICD-10-CM | POA: Diagnosis present

## 2015-03-07 DIAGNOSIS — I10 Essential (primary) hypertension: Secondary | ICD-10-CM | POA: Diagnosis not present

## 2015-03-07 DIAGNOSIS — I5023 Acute on chronic systolic (congestive) heart failure: Principal | ICD-10-CM | POA: Diagnosis present

## 2015-03-07 DIAGNOSIS — R0902 Hypoxemia: Secondary | ICD-10-CM | POA: Diagnosis present

## 2015-03-07 DIAGNOSIS — I5042 Chronic combined systolic (congestive) and diastolic (congestive) heart failure: Secondary | ICD-10-CM | POA: Diagnosis present

## 2015-03-07 DIAGNOSIS — I160 Hypertensive urgency: Secondary | ICD-10-CM | POA: Diagnosis present

## 2015-03-07 DIAGNOSIS — N183 Chronic kidney disease, stage 3 (moderate): Secondary | ICD-10-CM | POA: Diagnosis present

## 2015-03-07 DIAGNOSIS — Z7982 Long term (current) use of aspirin: Secondary | ICD-10-CM

## 2015-03-07 DIAGNOSIS — Z79899 Other long term (current) drug therapy: Secondary | ICD-10-CM

## 2015-03-07 DIAGNOSIS — E785 Hyperlipidemia, unspecified: Secondary | ICD-10-CM | POA: Diagnosis present

## 2015-03-07 DIAGNOSIS — Z833 Family history of diabetes mellitus: Secondary | ICD-10-CM

## 2015-03-07 DIAGNOSIS — Z6841 Body Mass Index (BMI) 40.0 and over, adult: Secondary | ICD-10-CM

## 2015-03-07 DIAGNOSIS — Z9989 Dependence on other enabling machines and devices: Secondary | ICD-10-CM

## 2015-03-07 DIAGNOSIS — R0789 Other chest pain: Secondary | ICD-10-CM | POA: Diagnosis not present

## 2015-03-07 DIAGNOSIS — N184 Chronic kidney disease, stage 4 (severe): Secondary | ICD-10-CM | POA: Diagnosis present

## 2015-03-07 DIAGNOSIS — M109 Gout, unspecified: Secondary | ICD-10-CM | POA: Diagnosis present

## 2015-03-07 DIAGNOSIS — Z8701 Personal history of pneumonia (recurrent): Secondary | ICD-10-CM

## 2015-03-07 DIAGNOSIS — I248 Other forms of acute ischemic heart disease: Secondary | ICD-10-CM | POA: Diagnosis present

## 2015-03-07 HISTORY — DX: Heart failure, unspecified: I50.9

## 2015-03-07 LAB — CBC WITH DIFFERENTIAL/PLATELET
BASOS ABS: 0 10*3/uL (ref 0.0–0.1)
Basophils Relative: 0 %
Eosinophils Absolute: 0.1 10*3/uL (ref 0.0–0.7)
Eosinophils Relative: 1 %
HCT: 41.1 % (ref 39.0–52.0)
Hemoglobin: 12.8 g/dL — ABNORMAL LOW (ref 13.0–17.0)
LYMPHS ABS: 1.5 10*3/uL (ref 0.7–4.0)
Lymphocytes Relative: 27 %
MCH: 27.8 pg (ref 26.0–34.0)
MCHC: 31.1 g/dL (ref 30.0–36.0)
MCV: 89.3 fL (ref 78.0–100.0)
MONO ABS: 0.6 10*3/uL (ref 0.1–1.0)
MONOS PCT: 10 %
NEUTROS PCT: 62 %
Neutro Abs: 3.3 10*3/uL (ref 1.7–7.7)
PLATELETS: 187 10*3/uL (ref 150–400)
RBC: 4.6 MIL/uL (ref 4.22–5.81)
RDW: 13.7 % (ref 11.5–15.5)
WBC: 5.5 10*3/uL (ref 4.0–10.5)

## 2015-03-07 LAB — COMPREHENSIVE METABOLIC PANEL
ALBUMIN: 3.6 g/dL (ref 3.5–5.0)
ALT: 14 U/L — AB (ref 17–63)
AST: 23 U/L (ref 15–41)
Alkaline Phosphatase: 72 U/L (ref 38–126)
Anion gap: 7 (ref 5–15)
BUN: 14 mg/dL (ref 6–20)
CHLORIDE: 101 mmol/L (ref 101–111)
CO2: 34 mmol/L — AB (ref 22–32)
Calcium: 8.6 mg/dL — ABNORMAL LOW (ref 8.9–10.3)
Creatinine, Ser: 1.51 mg/dL — ABNORMAL HIGH (ref 0.61–1.24)
GFR calc Af Amer: 60 mL/min — ABNORMAL LOW (ref >60)
GFR calc non Af Amer: 51 mL/min — ABNORMAL LOW (ref >60)
GLUCOSE: 93 mg/dL (ref 65–99)
POTASSIUM: 3.5 mmol/L (ref 3.5–5.1)
Sodium: 142 mmol/L (ref 135–145)
Total Bilirubin: 0.5 mg/dL (ref 0.3–1.2)
Total Protein: 6.4 g/dL — ABNORMAL LOW (ref 6.5–8.1)

## 2015-03-07 LAB — I-STAT TROPONIN, ED: TROPONIN I, POC: 0.04 ng/mL (ref 0.00–0.08)

## 2015-03-07 LAB — TROPONIN I: TROPONIN I: 0.04 ng/mL — AB (ref <0.031)

## 2015-03-07 LAB — PROTIME-INR
INR: 0.99 (ref 0.00–1.49)
Prothrombin Time: 13.3 seconds (ref 11.6–15.2)

## 2015-03-07 LAB — BRAIN NATRIURETIC PEPTIDE: B NATRIURETIC PEPTIDE 5: 282.4 pg/mL — AB (ref 0.0–100.0)

## 2015-03-07 MED ORDER — FEBUXOSTAT 40 MG PO TABS
40.0000 mg | ORAL_TABLET | Freq: Every day | ORAL | Status: DC
  Administered 2015-03-08 – 2015-03-11 (×4): 40 mg via ORAL
  Filled 2015-03-07 (×4): qty 1

## 2015-03-07 MED ORDER — NITROGLYCERIN 0.4 MG SL SUBL: 0.4000 mg | SUBLINGUAL_TABLET | SUBLINGUAL | Status: DC | PRN

## 2015-03-07 MED ORDER — ACETAMINOPHEN 325 MG PO TABS: 650.0000 mg | ORAL_TABLET | ORAL | Status: DC | PRN

## 2015-03-07 MED ORDER — HYDRALAZINE HCL 20 MG/ML IJ SOLN: 10.0000 mg | INTRAMUSCULAR | Status: DC | PRN

## 2015-03-07 MED ORDER — LEVOFLOXACIN 500 MG PO TABS
500.0000 mg | ORAL_TABLET | Freq: Every day | ORAL | Status: AC
  Administered 2015-03-08: 500 mg via ORAL
  Filled 2015-03-07: qty 1

## 2015-03-07 MED ORDER — ENOXAPARIN SODIUM 40 MG/0.4ML ~~LOC~~ SOLN
40.0000 mg | SUBCUTANEOUS | Status: DC
  Administered 2015-03-07 – 2015-03-10 (×4): 40 mg via SUBCUTANEOUS
  Filled 2015-03-07 (×5): qty 0.4

## 2015-03-07 MED ORDER — FUROSEMIDE 40 MG PO TABS
40.0000 mg | ORAL_TABLET | Freq: Every day | ORAL | Status: DC
  Administered 2015-03-07: 40 mg via ORAL
  Filled 2015-03-07 (×2): qty 1

## 2015-03-07 MED ORDER — ASPIRIN 81 MG PO CHEW
81.0000 mg | CHEWABLE_TABLET | Freq: Every day | ORAL | Status: DC
  Administered 2015-03-08 – 2015-03-11 (×4): 81 mg via ORAL
  Filled 2015-03-07 (×4): qty 1

## 2015-03-07 MED ORDER — CARVEDILOL 6.25 MG PO TABS
6.2500 mg | ORAL_TABLET | Freq: Two times a day (BID) | ORAL | Status: DC
  Administered 2015-03-07: 6.25 mg via ORAL
  Filled 2015-03-07: qty 1

## 2015-03-07 MED ORDER — ONDANSETRON HCL 4 MG/2ML IJ SOLN: 4.0000 mg | Freq: Four times a day (QID) | INTRAMUSCULAR | Status: DC | PRN

## 2015-03-07 MED ORDER — MORPHINE SULFATE (PF) 2 MG/ML IV SOLN: 2.0000 mg | INTRAVENOUS | Status: DC | PRN

## 2015-03-07 MED ORDER — ASPIRIN 81 MG PO CHEW
324.0000 mg | CHEWABLE_TABLET | Freq: Once | ORAL | Status: AC
  Administered 2015-03-07: 324 mg via ORAL
  Filled 2015-03-07: qty 4

## 2015-03-07 MED ORDER — ATORVASTATIN CALCIUM 40 MG PO TABS
40.0000 mg | ORAL_TABLET | Freq: Every day | ORAL | Status: DC
  Administered 2015-03-07 – 2015-03-11 (×5): 40 mg via ORAL
  Filled 2015-03-07 (×5): qty 1

## 2015-03-07 NOTE — H&P (Addendum)
PCP:  [name], MD  [facility] [place]  Referring provider Erin   Chief Complaint:  Chest pain and shortness of breath  HPI: Phillip [name] is a 54 y.o. male   has a past medical history of Hypertension; Gout; OSA on CPAP; Morbid obesity ([place]); Pulmonary embolism ([place]); PNA (pneumonia); and CHF (congestive heart failure) ([place]).   Presented with  Chest discomfort since yesterday it was coming and going. Started at rest while laying on his front. When he rolled he felt it in his shoulders and then left arm including his fingers.  Today at 2 PM he had chest pain level 3 felt like someone was standing on his chest. This sensation lasted one hours and currently resolved. Initial troponin is WNL ECG non-ischemic. Reports having stress test done in the past 1 year denies any fever or chills no syncope Patient has been on 2 L of oxygen at baseline for almost 1 year.  He states he was recommended to be on oxygen 3 years ago but did not feel like he needed that until past year. Patient have been having increased cough since developing sinus infection he was started empirically on Levaquin.   Of note 2 weeks ago patient was discharged from hospital with diagnosis of CHF exacerbation. He was well responded to Lasix at that time was instructed to follow up with cardiology unfortunately he was not able to do so.  Hospitalist was called for admission for Chest dyscomfort Review of Systems:    Pertinent positives include: chest pain, shortness of breath at rest. dyspnea on exertion,   Constitutional:  No weight loss, night sweats, Fevers, chills, fatigue, weight loss  HEENT:  No headaches, Difficulty swallowing,Tooth/dental problems,Sore throat,  No sneezing, itching, ear ache, nasal congestion, post nasal drip,  Cardio-vascular:  No Orthopnea, PND, anasarca, dizziness, palpitations.no Bilateral lower extremity swelling  GI:  No heartburn, indigestion, abdominal pain, nausea, vomiting,  diarrhea, change in bowel habits, loss of appetite, melena, blood in stool, hematemesis Resp:   No excess mucus, no productive cough, No non-productive cough, No coughing up of blood.No change in color of mucus.No wheezing. Skin:  no rash or lesions. No jaundice GU:  no dysuria, change in color of urine, no urgency or frequency. No straining to urinate.  No flank pain.  Musculoskeletal:  No joint pain or no joint swelling. No decreased range of motion. No back pain.  Psych:  No change in mood or affect. No depression or anxiety. No memory loss.  Neuro: no localizing neurological complaints, no tingling, no weakness, no double vision, no gait abnormality, no slurred speech, no confusion  Otherwise ROS are negative except for above, 10 systems were reviewed  Past Medical History: Past Medical History  Diagnosis Date  . Hypertension   . Gout   . OSA on CPAP   . Morbid obesity ([place])   . Pulmonary embolism ([place])   . PNA (pneumonia)   . CHF (congestive heart failure) [facility])    Past Surgical History  Procedure Laterality Date  . Hernia repair       Medications: Prior to Admission medications   Medication Sig Start Date End Date Taking? Authorizing Provider  albuterol (PROVENTIL HFA;VENTOLIN HFA) 108 (90 BASE) MCG/ACT inhaler Inhale 2 puffs into the lungs every 6 (six) hours as needed for shortness of breath. 05/09/14  Yes Modena Jansky, MD  aspirin 81 MG chewable tablet Chew 1 tablet (81 mg total) by mouth daily. 02/22/15  Yes Florencia Reasons,  MD  atorvastatin (LIPITOR) 40 MG tablet Take 1 tablet (40 mg total) by mouth daily at 6 PM. 02/22/15  Yes Florencia Reasons, MD  carvedilol (COREG) 6.25 MG tablet Take 1 tablet (6.25 mg total) by mouth 2 (two) times daily with a meal. 02/22/15  Yes Florencia Reasons, MD  febuxostat (ULORIC) 40 MG tablet Take 1 tablet (40 mg total) by mouth daily. 02/22/15  Yes Florencia Reasons, MD  furosemide (LASIX) 40 MG tablet Take 1 tablet (40 mg total) by mouth every other day. 02/22/15  Yes  Florencia Reasons, MD  levofloxacin (LEVAQUIN) 500 MG tablet Take 500 mg by mouth daily. ABT Start Date 03/02/15 & End Date 03/08/15.   Yes Historical Provider, MD  colchicine 0.6 MG tablet Take 0.6 mg by mouth daily as needed. For gout    Historical Provider, MD    Allergies:  No Known Allergies  Social History:  Ambulatory  independently   Lives at home alone,       reports that he has never smoked. He does not have any smokeless tobacco history on file. He reports that he does not drink alcohol or use illicit drugs.    Family History: family history includes CAD in his father; Diabetes in his father and other; Hypertension in his other.    Physical Exam: Patient Vitals for the past 24 hrs:  BP Temp Temp src Pulse Resp SpO2  03/07/15 2039 171/80 mmHg - - 75 18 98 %  03/07/15 1830 164/82 mmHg - - 64 19 98 %  03/07/15 1736 (!) 204/103 mmHg 98.8 F (37.1 C) Oral 73 22 (!) 89 %    1. General:  in No Acute distress 2. Psychological: Alert and   Oriented 3. Head/ENT:     Dry Mucous Membranes                          Head Non traumatic, neck supple                          Normal  Dentition 4. SKIN:   decreased Skin turgor,  Skin clean Dry and intact no rash 5. Heart: Regular rate and rhythm no Murmur, Rub or gallop 6. Lungs: Distant breath sounds due to habitus difficult exam no wheezes or crackles   7. Abdomen: Soft, non-tender, Non distended, obese 8. Lower extremities: no clubbing, cyanosis, or edema 9. Neurologically Grossly intact, moving all 4 extremities equally 10. MSK: Normal range of motion  body mass index is unknown because there is no weight on file.   Labs on Admission:   Results for orders placed or performed during the hospital encounter of 03/07/15 (from the past 24 hour(s))  CBC with Differential     Status: Abnormal   Collection Time: 03/07/15  6:06 PM  Result Value Ref Range   WBC 5.5 4.0 - 10.5 K/uL   RBC 4.60 4.22 - 5.81 MIL/uL   Hemoglobin 12.8 (L) 13.0 -  17.0 g/dL   HCT 41.1 39.0 - 52.0 %   MCV 89.3 78.0 - 100.0 fL   MCH 27.8 26.0 - 34.0 pg   MCHC 31.1 30.0 - 36.0 g/dL   RDW 13.7 11.5 - 15.5 %   Platelets 187 150 - 400 K/uL   Neutrophils Relative % 62 %   Lymphocytes Relative 27 %   Monocytes Relative 10 %   Eosinophils Relative 1 %   Basophils Relative 0 %  Neutro Abs 3.3 1.7 - 7.7 K/uL   Lymphs Abs 1.5 0.7 - 4.0 K/uL   Monocytes Absolute 0.6 0.1 - 1.0 K/uL   Eosinophils Absolute 0.1 0.0 - 0.7 K/uL   Basophils Absolute 0.0 0.0 - 0.1 K/uL   Smear Review MORPHOLOGY UNREMARKABLE   Comprehensive metabolic panel     Status: Abnormal   Collection Time: 03/07/15  6:06 PM  Result Value Ref Range   Sodium 142 135 - 145 mmol/L   Potassium 3.5 3.5 - 5.1 mmol/L   Chloride 101 101 - 111 mmol/L   CO2 34 (H) 22 - 32 mmol/L   Glucose, Bld 93 65 - 99 mg/dL   BUN 14 6 - 20 mg/dL   Creatinine, Ser 1.51 (H) 0.61 - 1.24 mg/dL   Calcium 8.6 (L) 8.9 - 10.3 mg/dL   Total Protein 6.4 (L) 6.5 - 8.1 g/dL   Albumin 3.6 3.5 - 5.0 g/dL   AST 23 15 - 41 U/L   ALT 14 (L) 17 - 63 U/L   Alkaline Phosphatase 72 38 - 126 U/L   Total Bilirubin 0.5 0.3 - 1.2 mg/dL   GFR calc non Af Amer 51 (L) >60 mL/min   GFR calc Af Amer 60 (L) >60 mL/min   Anion gap 7 5 - 15  Brain natriuretic peptide     Status: Abnormal   Collection Time: 03/07/15  6:06 PM  Result Value Ref Range   B Natriuretic Peptide 282.4 (H) 0.0 - 100.0 pg/mL  Protime-INR     Status: None   Collection Time: 03/07/15  6:06 PM  Result Value Ref Range   Prothrombin Time 13.3 11.6 - 15.2 seconds   INR 0.99 0.00 - 1.49  I-Stat Troponin, ED - 0, 3, 6 hours (not at [facility])     Status: None   Collection Time: 03/07/15  6:23 PM  Result Value Ref Range   Troponin i, poc 0.04 0.00 - 0.08 ng/mL   Comment 3            UA not obtained  Lab Results  Component Value Date   HGBA1C 6.1* 02/21/2015    CrCl cannot be calculated (Unknown ideal weight.).  BNP (last 3 results) No results for input(s):  PROBNP in the last 8760 hours.  Other results:  I have pearsonaly reviewed this: ECG REPORT  Rate:  79  Rhythm: NSR ST&T Change: no ischemic changes QTC 458   There were no vitals filed for this visit.   Cultures:    Component Value Date/Time   SDES SPUTUM 05/07/2014 0249   SDES URINE, CLEAN CATCH 05/07/2014 0249   SDES SPUTUM 05/07/2014 0249   SPECREQUEST Normal 05/07/2014 0249   SPECREQUEST NONE 05/07/2014 0249   SPECREQUEST NONE 05/07/2014 0249   CULT  05/07/2014 0249    NORMAL OROPHARYNGEAL FLORA Performed at [place] 05/07/2014 FINAL 05/07/2014 0249   REPTSTATUS 05/08/2014 FINAL 05/07/2014 0249   REPTSTATUS 05/09/2014 FINAL 05/07/2014 0249     Radiological Exams on Admission: Dg Chest 2 View  03/07/2015  CLINICAL DATA:  Left-sided chest pain beginning this afternoon. Worsening shortness of breath and fatigue for 2 days. Chronic kidney disease and congestive heart failure. EXAM: CHEST  2 VIEW COMPARISON:  02/20/2015 FINDINGS: Mild cardiomegaly stable. Both lungs are clear. No evidence of pneumothorax or pleural effusion. IMPRESSION: Mild cardiomegaly.  No active lung disease. Electronically Signed   By: Earle Gell M.D.   On: 03/07/2015 18:04  Chart has been reviewed  Family not at  Bedside    Assessment/Plan  54yo M with hx of chronic combined CHF, HTN, here with atypical chest pain   Present on Admission:  . Chest pain- given risk factors will admit, monitor on telemetry, cycle cardiac enzymes, obtain serial ECG. Further risk stratify with lipid panel, hgA1C, obtain TSH. Make sure patient is on Aspirin. Further treatment based on the currently pending results.  . SOB (shortness of breath) patient have had prior workup for this in the past and seems to be chronic and ongoing. We'll make sure he is on home oxygen. Patient may benefit from pulmonary rehabilitation, patient has history of asthma make sure it is on albuterol currently no  wheezing appreciated recently has been diagnosed with upper URI but was treated with Levaquin as days to more all lead him finish the antibiotic course it is possible patient has some underlying bronchitis.  . Hypoxia transient improved well patient was back to his home level of oxygen of 2 L  . CKD (chronic kidney disease), stage III - chronic currently at baseline we will avoid renal toxic medications  . Obesity - will obtain diet consult  . Hypertensive urgency - will resume home medications to ensure compliance. Give hydralazine as needed chest pain may be partially secondary to poorly controlled blood pressure  . OSA on CPAP -CPAPper resiratoryordered . Hypertension resume home medications  . Chronic combined systolic (congestive) and diastolic (congestive) heart failure (HCC) - increase dose of Lasix given worsening shortness of breath to 40 twice a day. Give 1 dose of Lasix IV to see if he may be retaining fluid intra-abdominally weight on admission 415 LB which is up about 10 pounds   Prophylaxis:  Lovenox   CODE STATUS:  FULL CODE  as per patient    Disposition: To home once workup is complete and patient is stable  Other plan as per orders.  I have spent a total of 55 min on this admission  Alexandera Kuntzman 03/07/2015, 8:42 PM  [facility]  Pager [phone]   after 2 AM please page floor coverage PA If 7AM-7PM, please contact the day team taking care of the patient  [email or web address]  Password [email or web address]

## 2015-03-07 NOTE — ED Notes (Addendum)
Pt complaining of left sided chest pain with left finger tingling starting around 14:00 this afternoon. 2 days prior began having worsening SOB and fatigue with the pain starting today. Denies N/V/D fever and chills. New diagnoses of CHF

## 2015-03-07 NOTE — ED Notes (Signed)
Pt transported to 1404 via stretcher, remains on monitor & O2 @ 2L/[place].

## 2015-03-07 NOTE — ED Notes (Addendum)
Pt remains on monitor & oxygen @ 2 L/[place].  Pt states "I took my ASA today."  Informed will speak to provider r/t same.  Pt verbalized understanding.

## 2015-03-07 NOTE — ED Notes (Signed)
UNABLE TO COLLECT LABS AT THIS TIME PATIENT IS GOING TO XRAY

## 2015-03-07 NOTE — ED Provider Notes (Signed)
CSN: [id number]     Arrival date & time 03/07/15  1723 History   First MD Initiated Contact with Patient 03/07/15 1733     Chief Complaint  Patient presents with  . Chest Pain  . Shortness of Breath     (Consider location/radiation/quality/duration/timing/severity/associated sxs/prior Treatment) Patient is a 54 y.o. male presenting with chest pain and shortness of breath.  Chest Pain Pain location:  L chest Pain quality: pressure and radiating   Pain quality comment:  Numb/tingling Pain radiates to:  L shoulder and L arm Pain severity:  Moderate (4/10) Onset quality:  Sudden Duration:  4 hours Timing:  Constant Progression:  Waxing and waning Chronicity:  New Relieved by:  Nothing Worsened by:  Exertion Associated symptoms: cough (2 months), diaphoresis, fatigue, lower extremity edema (since july, fluid pills,  decreased since then), palpitations and shortness of breath   Associated symptoms: no abdominal pain, no anorexia, no back pain, no fever, no headache, no nausea, no numbness and not vomiting   Risk factors: hypertension and obesity   Risk factors: no coronary artery disease and no diabetes mellitus   Risk factors comment:  Hx of stress test greater than 5 yrs ago (adenosine), CP feelign fatigued since October Shortness of Breath Associated symptoms: chest pain, cough (2 months) and diaphoresis   Associated symptoms: no abdominal pain, no fever, no headaches, no rash, no sore throat and no vomiting     Past Medical History  Diagnosis Date  . Hypertension   . Gout   . OSA on CPAP   . Morbid obesity ([place])   . Pulmonary embolism ([place])   . PNA (pneumonia)   . CHF (congestive heart failure) [facility])    Past Surgical History  Procedure Laterality Date  . Hernia repair     Family History  Problem Relation Age of Onset  . Diabetes Father   . CAD Father   . Hypertension Other   . Diabetes Other    Social History  Substance Use Topics  . Smoking status: Never  Smoker   . Smokeless tobacco: None  . Alcohol Use: No    Review of Systems  Constitutional: Positive for diaphoresis and fatigue. Negative for fever.  HENT: Negative for sore throat.   Eyes: Negative for visual disturbance.  Respiratory: Positive for cough (2 months) and shortness of breath.   Cardiovascular: Positive for chest pain and palpitations.  Gastrointestinal: Negative for nausea, vomiting, abdominal pain and anorexia.  Genitourinary: Negative for difficulty urinating.  Musculoskeletal: Negative for back pain and neck stiffness.  Skin: Negative for rash.  Neurological: Negative for syncope, numbness and headaches.      Allergies  Review of patient's allergies indicates no known allergies.  Home Medications   Prior to Admission medications   Medication Sig Start Date End Date Taking? Authorizing Provider  albuterol (PROVENTIL HFA;VENTOLIN HFA) 108 (90 BASE) MCG/ACT inhaler Inhale 2 puffs into the lungs every 6 (six) hours as needed for shortness of breath. 05/09/14  Yes Modena Jansky, MD  aspirin 81 MG chewable tablet Chew 1 tablet (81 mg total) by mouth daily. 02/22/15  Yes Florencia Reasons, MD  atorvastatin (LIPITOR) 40 MG tablet Take 1 tablet (40 mg total) by mouth daily at 6 PM. 02/22/15  Yes Florencia Reasons, MD  carvedilol (COREG) 6.25 MG tablet Take 1 tablet (6.25 mg total) by mouth 2 (two) times daily with a meal. 02/22/15  Yes Florencia Reasons, MD  febuxostat (ULORIC) 40 MG tablet Take 1 tablet (40  mg total) by mouth daily. 02/22/15  Yes Florencia Reasons, MD  furosemide (LASIX) 40 MG tablet Take 1 tablet (40 mg total) by mouth every other day. 02/22/15  Yes Florencia Reasons, MD  levofloxacin (LEVAQUIN) 500 MG tablet Take 500 mg by mouth daily. ABT Start Date 03/02/15 & End Date 03/08/15.   Yes Historical Provider, MD  colchicine 0.6 MG tablet Take 0.6 mg by mouth daily as needed. For gout    Historical Provider, MD   BP 171/76 mmHg  Pulse 71  Temp(Src) 98.2 F (36.8 C) (Oral)  Resp 16  Ht 5\' 9"  (1.753 m)   Wt 416 lb (188.696 kg)  BMI 61.40 kg/m2  SpO2 98% Physical Exam  Constitutional: He is oriented to person, place, and time. He appears well-developed and well-nourished. No distress.  HENT:  Head: Normocephalic and atraumatic.  Eyes: Conjunctivae and EOM are normal.  Neck: Normal range of motion.  Cardiovascular: Normal rate, regular rhythm, normal heart sounds and intact distal pulses.  Exam reveals no gallop and no friction rub.   No murmur heard. Pulmonary/Chest: Effort normal and breath sounds normal. No respiratory distress. He has no wheezes. He has no rales. He exhibits no tenderness.  Abdominal: Soft. He exhibits no distension. There is no tenderness. There is no guarding.  Musculoskeletal: He exhibits no edema.  Neurological: He is alert and oriented to person, place, and time.  Skin: Skin is warm and dry. He is not diaphoretic.  Nursing note and vitals reviewed.   ED Course  Procedures (including critical care time) Labs Review Labs Reviewed  CBC WITH DIFFERENTIAL/PLATELET - Abnormal; Notable for the following:    Hemoglobin 12.8 (*)    All other components within normal limits  COMPREHENSIVE METABOLIC PANEL - Abnormal; Notable for the following:    CO2 34 (*)    Creatinine, Ser 1.51 (*)    Calcium 8.6 (*)    Total Protein 6.4 (*)    ALT 14 (*)    GFR calc non Af Amer 51 (*)    GFR calc Af Amer 60 (*)    All other components within normal limits  BRAIN NATRIURETIC PEPTIDE - Abnormal; Notable for the following:    B Natriuretic Peptide 282.4 (*)    All other components within normal limits  TROPONIN I - Abnormal; Notable for the following:    Troponin I 0.04 (*)    All other components within normal limits  PROTIME-INR  URINE RAPID DRUG SCREEN, HOSP PERFORMED  TROPONIN I  TROPONIN I  I-STAT TROPOININ, ED    Imaging Review Dg Chest 2 View  03/07/2015  CLINICAL DATA:  Left-sided chest pain beginning this afternoon. Worsening shortness of breath and fatigue  for 2 days. Chronic kidney disease and congestive heart failure. EXAM: CHEST  2 VIEW COMPARISON:  02/20/2015 FINDINGS: Mild cardiomegaly stable. Both lungs are clear. No evidence of pneumothorax or pleural effusion. IMPRESSION: Mild cardiomegaly.  No active lung disease. Electronically Signed   By: Earle Gell M.D.   On: 03/07/2015 18:04   I have personally reviewed and evaluated these images and lab results as part of my medical decision-making.   EKG Interpretation   Date/Time:  Wednesday March 07 2015 17:33:22 EST Ventricular Rate:  79 PR Interval:  148 QRS Duration: 89 QT Interval:  399 QTC Calculation: 457 R Axis:   -48 Text Interpretation:  Sinus rhythm Left anterior fascicular block Abnormal  R-wave progression, late transition No significant change since last  tracing Confirmed by  [facility] MD, Junie Panning ([id number]) on 03/07/2015 7:22:40 PM      MDM   Final diagnoses:  Other chest pain   54 year old male with a history of morbid obesity, CK D, hypertension, PE, OSA, asthma, CHF with an ejection fraction of 45% presents with concern for chest pain and shortness of breath. Patient was recently admitted for shortness of breath, and had a CTA PE study and VQ scan which showed low probability for PE. Today patient developed chest pain which is a new symptom, and describes it as a pressure-like tingling which worsens with exertion and is associated so she was shortness of breath and diaphoresis. EKG is unchanged from prior. Chest x-ray shows mild cardiomegaly without signs of pulmonary edema. BNP is increased from prior up to 282. I-STAT troponin is 0.04. Patient given aspirin. He is chest pain-free at this time. He is high risk HEAR score with concerning symptoms and will admit for further chest pain rule out evaluation.     Gareth Morgan, MD 03/08/15 [phone]

## 2015-03-07 NOTE — ED Notes (Signed)
Hospitalist @ [facility].

## 2015-03-08 DIAGNOSIS — I160 Hypertensive urgency: Secondary | ICD-10-CM | POA: Diagnosis not present

## 2015-03-08 DIAGNOSIS — R0902 Hypoxemia: Secondary | ICD-10-CM

## 2015-03-08 DIAGNOSIS — I5023 Acute on chronic systolic (congestive) heart failure: Principal | ICD-10-CM

## 2015-03-08 DIAGNOSIS — I10 Essential (primary) hypertension: Secondary | ICD-10-CM | POA: Diagnosis not present

## 2015-03-08 DIAGNOSIS — E669 Obesity, unspecified: Secondary | ICD-10-CM

## 2015-03-08 DIAGNOSIS — R079 Chest pain, unspecified: Secondary | ICD-10-CM | POA: Diagnosis not present

## 2015-03-08 DIAGNOSIS — N183 Chronic kidney disease, stage 3 (moderate): Secondary | ICD-10-CM

## 2015-03-08 DIAGNOSIS — G4733 Obstructive sleep apnea (adult) (pediatric): Secondary | ICD-10-CM

## 2015-03-08 LAB — RAPID URINE DRUG SCREEN, HOSP PERFORMED
Amphetamines: NOT DETECTED
BARBITURATES: NOT DETECTED
Benzodiazepines: NOT DETECTED
Cocaine: NOT DETECTED
Opiates: NOT DETECTED
Tetrahydrocannabinol: NOT DETECTED

## 2015-03-08 LAB — TROPONIN I
Troponin I: 0.04 ng/mL — ABNORMAL HIGH (ref <0.031)
Troponin I: 0.03 ng/mL (ref <0.031)

## 2015-03-08 MED ORDER — IPRATROPIUM BROMIDE 0.02 % IN SOLN
0.5000 mg | Freq: Four times a day (QID) | RESPIRATORY_TRACT | Status: DC
  Administered 2015-03-08: 0.5 mg via RESPIRATORY_TRACT
  Filled 2015-03-08 (×2): qty 2.5

## 2015-03-08 MED ORDER — ALBUTEROL SULFATE (2.5 MG/3ML) 0.083% IN NEBU
2.5000 mg | INHALATION_SOLUTION | RESPIRATORY_TRACT | Status: DC | PRN
  Administered 2015-03-08: 2.5 mg via RESPIRATORY_TRACT
  Filled 2015-03-08 (×2): qty 3

## 2015-03-08 MED ORDER — IPRATROPIUM-ALBUTEROL 0.5-2.5 (3) MG/3ML IN SOLN: 3.0000 mL | Freq: Four times a day (QID) | RESPIRATORY_TRACT | Status: DC

## 2015-03-08 MED ORDER — ISOSORB DINITRATE-HYDRALAZINE 20-37.5 MG PO TABS
1.0000 | ORAL_TABLET | Freq: Two times a day (BID) | ORAL | Status: DC
  Administered 2015-03-08 (×2): 1 via ORAL
  Filled 2015-03-08 (×3): qty 1

## 2015-03-08 MED ORDER — FUROSEMIDE 10 MG/ML IJ SOLN
40.0000 mg | Freq: Two times a day (BID) | INTRAMUSCULAR | Status: DC
  Administered 2015-03-08 – 2015-03-11 (×6): 40 mg via INTRAVENOUS
  Filled 2015-03-08 (×6): qty 4

## 2015-03-08 MED ORDER — CARVEDILOL 12.5 MG PO TABS
12.5000 mg | ORAL_TABLET | Freq: Two times a day (BID) | ORAL | Status: DC
  Administered 2015-03-08 – 2015-03-11 (×8): 12.5 mg via ORAL
  Filled 2015-03-08 (×5): qty 1
  Filled 2015-03-08: qty 2
  Filled 2015-03-08 (×2): qty 1

## 2015-03-08 MED ORDER — IPRATROPIUM-ALBUTEROL 0.5-2.5 (3) MG/3ML IN SOLN
3.0000 mL | Freq: Three times a day (TID) | RESPIRATORY_TRACT | Status: DC
  Administered 2015-03-08 – 2015-03-10 (×9): 3 mL via RESPIRATORY_TRACT
  Filled 2015-03-08 (×8): qty 3

## 2015-03-08 MED ORDER — FUROSEMIDE 40 MG PO TABS
40.0000 mg | ORAL_TABLET | Freq: Two times a day (BID) | ORAL | Status: DC
  Administered 2015-03-08: 40 mg via ORAL
  Filled 2015-03-08: qty 1

## 2015-03-08 NOTE — Progress Notes (Signed)
[facility] PROGRESS NOTE Interim History: 54 y/o male who has PMH of Hypertension; Gout; OSA on CPAP; Morbid obesity ([place]); Pulmonary embolism ([place]); PNA (pneumonia); and CHF (congestive heart failure) ([place]). Presented with atypical CP and SOB. Found to have approx 10 pounds weight gain since last discharge and has signs of fluid overload.    Filed Weights   03/07/15 2126  Weight: 188.696 kg (416 lb)        Intake/Output Summary (Last 24 hours) at 03/08/15 1702 Last data filed at 03/08/15 1030  Gross per 24 hour  Intake    240 ml  Output    200 ml  Net     40 ml     Assessment/Plan: 1-Chest pain: atypical, but with risk factors  -troponin flat elevation and normalization after IV lasix given -most likely demand ischemia from CHF exacerbation -no ischemic changes on EKG -will monitor -continue tx for CHF -will continue b-blocker, continue ASA, continue statins and started on BIDIL  2-acute on chronic systolic HF: with mild hypoxia -last EF 40-45% -will continue low sodium diet -continue IV lasix -check daily weight and strict intake/output -will continue B-blocker and will add BIDIL  4-hypoxia: multifactorial: OSA, OHS and CHF exacerbation -recently recovering from acute bronchitis -will continue treatment for CHF exacerbation -low calorie diet and exercise discussed with patient -will continue CPAP QHS per RT  5-Obesity: Body mass index is 61.4 kg/(m^2). -discussed with patient about low calorie diet and exercise   6-Hypertensive urgency:  -will continue coreg (dose adjusted to 12.5mg  BID), will also continue lasix and will add BIDIL -will follow VS and adjust antihypertensive regimen as needed -continue low sodium diet  7-CKD (chronic kidney disease), stage III -Cr currently at baseline -will monitor with ongoing IV diuresis   8-OSA: will continue QHS CPAP    Code Status: Full Family Communication: no family at bedside Disposition Plan: continue  IV lasix, follow volume status and adjust medications for heart failure.   Consultants:  None   Procedures: ECHO: recently done; demonstrated EF 40-45%  Antibiotics:  levaquin (last dose on 12/15)  HPI/Subjective: No fever. Reports some orthopnea and SOB (with minimal exertion). Patient with O2 supplementation and signs of fluid overload.  Objective: Filed Vitals:   03/08/15 0552 03/08/15 0935 03/08/15 1443 03/08/15 1523  BP: 172/94  171/91   Pulse: 65  68   Temp: 98 F (36.7 C)  97.5 F (36.4 C)   TempSrc: Oral  Oral   Resp: 16  18   Height:      Weight:      SpO2: 97% 96% 100% 96%     Exam:  General: Alert, awake, oriented x3, in no acute distress. Still SOB and requiring 2 L of oxygen supplementation. Also reports some orthopnea  HEENT: No bruits, no goiter. Mild JVD on exam appreciated Heart: Regular rate and rhythm, no rubs or gallops on exam Lungs: decrease BS, positive bibasilar crackles, no wheezing  Abdomen: Soft, nontender, nondistended, positive bowel sounds.  Neuro: Grossly intact, nonfocal.   Data Reviewed: Basic Metabolic Panel:  Recent Labs Lab 03/07/15 1806  NA 142  K 3.5  CL 101  CO2 34*  GLUCOSE 93  BUN 14  CREATININE 1.51*  CALCIUM 8.6*   Liver Function Tests:  Recent Labs Lab 03/07/15 1806  AST 23  ALT 14*  ALKPHOS 72  BILITOT 0.5  PROT 6.4*  ALBUMIN 3.6   CBC:  Recent Labs Lab 03/07/15 1806  WBC 5.5  NEUTROABS  3.3  HGB 12.8*  HCT 41.1  MCV 89.3  PLT 187   Cardiac Enzymes:  Recent Labs Lab 03/07/15 2052 03/08/15 0300 03/08/15 0940  TROPONINI 0.04* 0.04* 0.03   BNP (last 3 results)  Recent Labs  12/04/14 0538 02/20/15 1757 03/07/15 1806  BNP 185.8* 194.3* 282.4*    Studies: Dg Chest 2 View  03/07/2015  CLINICAL DATA:  Left-sided chest pain beginning this afternoon. Worsening shortness of breath and fatigue for 2 days. Chronic kidney disease and congestive heart failure. EXAM: CHEST  2 VIEW  COMPARISON:  02/20/2015 FINDINGS: Mild cardiomegaly stable. Both lungs are clear. No evidence of pneumothorax or pleural effusion. IMPRESSION: Mild cardiomegaly.  No active lung disease. Electronically Signed   By: Earle Gell M.D.   On: 03/07/2015 18:04    Scheduled Meds: . aspirin  81 mg Oral Daily  . atorvastatin  40 mg Oral q1800  . carvedilol  12.5 mg Oral BID WC  . enoxaparin (LOVENOX) injection  40 mg Subcutaneous Q24H  . febuxostat  40 mg Oral Daily  . furosemide  40 mg Intravenous BID  . ipratropium-albuterol  3 mL Nebulization TID  . isosorbide-hydrALAZINE  1 tablet Oral BID   Continuous Infusions:   Time: 30 minutes  Barton Dubois  [facility] Pager [phone]. If 7PM-7AM, please contact night-coverage at [email or web address], password [facility] 03/08/2015, 5:02 PM

## 2015-03-09 DIAGNOSIS — I130 Hypertensive heart and chronic kidney disease with heart failure and stage 1 through stage 4 chronic kidney disease, or unspecified chronic kidney disease: Secondary | ICD-10-CM | POA: Diagnosis present

## 2015-03-09 DIAGNOSIS — E785 Hyperlipidemia, unspecified: Secondary | ICD-10-CM | POA: Diagnosis present

## 2015-03-09 DIAGNOSIS — R079 Chest pain, unspecified: Secondary | ICD-10-CM | POA: Diagnosis not present

## 2015-03-09 DIAGNOSIS — Z8249 Family history of ischemic heart disease and other diseases of the circulatory system: Secondary | ICD-10-CM | POA: Diagnosis not present

## 2015-03-09 DIAGNOSIS — Z7982 Long term (current) use of aspirin: Secondary | ICD-10-CM | POA: Diagnosis not present

## 2015-03-09 DIAGNOSIS — E669 Obesity, unspecified: Secondary | ICD-10-CM | POA: Diagnosis not present

## 2015-03-09 DIAGNOSIS — I248 Other forms of acute ischemic heart disease: Secondary | ICD-10-CM | POA: Diagnosis present

## 2015-03-09 DIAGNOSIS — J45909 Unspecified asthma, uncomplicated: Secondary | ICD-10-CM | POA: Diagnosis present

## 2015-03-09 DIAGNOSIS — G4733 Obstructive sleep apnea (adult) (pediatric): Secondary | ICD-10-CM | POA: Diagnosis present

## 2015-03-09 DIAGNOSIS — R0602 Shortness of breath: Secondary | ICD-10-CM | POA: Diagnosis present

## 2015-03-09 DIAGNOSIS — R0902 Hypoxemia: Secondary | ICD-10-CM | POA: Diagnosis present

## 2015-03-09 DIAGNOSIS — Z6841 Body Mass Index (BMI) 40.0 and over, adult: Secondary | ICD-10-CM | POA: Diagnosis not present

## 2015-03-09 DIAGNOSIS — M109 Gout, unspecified: Secondary | ICD-10-CM | POA: Diagnosis present

## 2015-03-09 DIAGNOSIS — I5023 Acute on chronic systolic (congestive) heart failure: Secondary | ICD-10-CM | POA: Diagnosis present

## 2015-03-09 DIAGNOSIS — N183 Chronic kidney disease, stage 3 (moderate): Secondary | ICD-10-CM | POA: Diagnosis present

## 2015-03-09 DIAGNOSIS — Z86711 Personal history of pulmonary embolism: Secondary | ICD-10-CM | POA: Diagnosis not present

## 2015-03-09 DIAGNOSIS — Z833 Family history of diabetes mellitus: Secondary | ICD-10-CM | POA: Diagnosis not present

## 2015-03-09 DIAGNOSIS — Z79899 Other long term (current) drug therapy: Secondary | ICD-10-CM | POA: Diagnosis not present

## 2015-03-09 DIAGNOSIS — Z8701 Personal history of pneumonia (recurrent): Secondary | ICD-10-CM | POA: Diagnosis not present

## 2015-03-09 DIAGNOSIS — I160 Hypertensive urgency: Secondary | ICD-10-CM | POA: Diagnosis present

## 2015-03-09 DIAGNOSIS — I10 Essential (primary) hypertension: Secondary | ICD-10-CM | POA: Diagnosis not present

## 2015-03-09 LAB — BASIC METABOLIC PANEL
ANION GAP: 7 (ref 5–15)
BUN: 18 mg/dL (ref 6–20)
CALCIUM: 8.7 mg/dL — AB (ref 8.9–10.3)
CO2: 37 mmol/L — AB (ref 22–32)
Chloride: 100 mmol/L — ABNORMAL LOW (ref 101–111)
Creatinine, Ser: 1.61 mg/dL — ABNORMAL HIGH (ref 0.61–1.24)
GFR, EST AFRICAN AMERICAN: 55 mL/min — AB (ref >60)
GFR, EST NON AFRICAN AMERICAN: 48 mL/min — AB (ref >60)
Glucose, Bld: 98 mg/dL (ref 65–99)
Potassium: 3.6 mmol/L (ref 3.5–5.1)
Sodium: 144 mmol/L (ref 135–145)

## 2015-03-09 MED ORDER — DOCUSATE SODIUM 100 MG PO CAPS
100.0000 mg | ORAL_CAPSULE | Freq: Two times a day (BID) | ORAL | Status: DC
  Administered 2015-03-09 – 2015-03-11 (×5): 100 mg via ORAL
  Filled 2015-03-09 (×7): qty 1

## 2015-03-09 MED ORDER — ISOSORB DINITRATE-HYDRALAZINE 20-37.5 MG PO TABS
1.5000 | ORAL_TABLET | Freq: Two times a day (BID) | ORAL | Status: DC
  Administered 2015-03-09 – 2015-03-11 (×5): 1.5 via ORAL
  Filled 2015-03-09 (×6): qty 1.5

## 2015-03-09 MED ORDER — POLYETHYLENE GLYCOL 3350 17 G PO PACK
17.0000 g | PACK | Freq: Every day | ORAL | Status: DC
  Administered 2015-03-09 – 2015-03-11 (×3): 17 g via ORAL
  Filled 2015-03-09 (×4): qty 1

## 2015-03-09 NOTE — Progress Notes (Signed)
[facility] PROGRESS NOTE Interim History: 54 y/o male who has PMH of Hypertension; Gout; OSA on CPAP; Morbid obesity ([place]); Pulmonary embolism ([place]); PNA (pneumonia); and CHF (congestive heart failure) ([place]). Presented with atypical CP and SOB. Found to have approx 10 pounds weight gain since last discharge and has signs of fluid overload.    Filed Weights   03/07/15 2126 03/09/15 0511  Weight: 188.696 kg (416 lb) 188.061 kg (414 lb 9.6 oz)        Intake/Output Summary (Last 24 hours) at 03/09/15 1302 Last data filed at 03/09/15 0048  Gross per 24 hour  Intake      0 ml  Output    300 ml  Net   -300 ml     Assessment/Plan: 1-Chest pain: atypical, but with risk factors. -troponin flat elevation and normalization after IV lasix given -most likely demand ischemia from CHF exacerbation -no ischemic changes on EKG -Patient is pain free now; will monitor -continue tx for CHF -will continue b-blocker, continue ASA, continue statins and BIDIL  2-acute on chronic systolic HF: with mild hypoxia -last EF 40-45% -will continue low sodium diet -continue IV lasix -check daily weight and strict intake/output -will continue B-blocker and BIDIL -close follow up of his renal function and electrolytes   4-hypoxia: multifactorial: OSA, OHS and CHF exacerbation -recently recovering from acute bronchitis -will continue treatment for CHF exacerbation -low calorie diet and exercise discussed with patient -will continue CPAP QHS per RT  5-Obesity: Body mass index is 61.2 kg/(m^2). -discussed with patient about low calorie diet and exercise   6-Hypertensive urgency:  -will continue coreg (dose adjusted to 12.5mg  BID), will also continue lasix and will increase BIDIL dose -will follow VS and adjust antihypertensive regimen as needed -continue low sodium diet  7-CKD (chronic kidney disease), stage III -Cr currently at baseline  -will monitor with ongoing IV diuresis   8-OSA:  will continue QHS CPAP   Code Status: Full Family Communication: no family at bedside Disposition Plan: continue IV lasix, follow volume status and adjust medications for heart failure.   Consultants:  None   Procedures: ECHO: recently done; demonstrated EF 40-45%  Antibiotics:  levaquin (last dose on 12/15)  HPI/Subjective: No fever. Denies CP; reports breathing is better, but still feels SOB and using 2L oxygen supplementation. Positive signs of fluid overload seen on exam.  Objective: Filed Vitals:   03/08/15 2120 03/08/15 2206 03/09/15 0511 03/09/15 0938  BP: 189/95  178/92   Pulse: 72 75 74   Temp: 98.9 F (37.2 C)  98.3 F (36.8 C)   TempSrc: Oral  Oral   Resp: 12 16 16    Height:      Weight:   188.061 kg (414 lb 9.6 oz)   SpO2: 99% 98% 97% 98%     Exam:  General: Alert, awake, oriented x3, in no acute distress. Denies CP. Still with orthopnea and SOB on exertion. Using 2 L of oxygen supplementation.  HEENT: No bruits, no goiter. Mild JVD on exam appreciated Heart: Regular rate and rhythm, no rubs or gallops on exam Lungs: decrease BS, positive bibasilar crackles, no wheezing  Abdomen: Soft, nontender, nondistended, positive bowel sounds.  Neuro: Grossly intact, nonfocal.   Data Reviewed: Basic Metabolic Panel:  Recent Labs Lab 03/07/15 1806 03/09/15 0423  NA 142 144  K 3.5 3.6  CL 101 100*  CO2 34* 37*  GLUCOSE 93 98  BUN 14 18  CREATININE 1.51* 1.61*  CALCIUM 8.6* 8.7*  Liver Function Tests:  Recent Labs Lab 03/07/15 1806  AST 23  ALT 14*  ALKPHOS 72  BILITOT 0.5  PROT 6.4*  ALBUMIN 3.6   CBC:  Recent Labs Lab 03/07/15 1806  WBC 5.5  NEUTROABS 3.3  HGB 12.8*  HCT 41.1  MCV 89.3  PLT 187   Cardiac Enzymes:  Recent Labs Lab 03/07/15 2052 03/08/15 0300 03/08/15 0940  TROPONINI 0.04* 0.04* 0.03   BNP (last 3 results)  Recent Labs  12/04/14 0538 02/20/15 1757 03/07/15 1806  BNP 185.8* 194.3* 282.4*     Studies: Dg Chest 2 View  03/07/2015  CLINICAL DATA:  Left-sided chest pain beginning this afternoon. Worsening shortness of breath and fatigue for 2 days. Chronic kidney disease and congestive heart failure. EXAM: CHEST  2 VIEW COMPARISON:  02/20/2015 FINDINGS: Mild cardiomegaly stable. Both lungs are clear. No evidence of pneumothorax or pleural effusion. IMPRESSION: Mild cardiomegaly.  No active lung disease. Electronically Signed   By: Earle Gell M.D.   On: 03/07/2015 18:04    Scheduled Meds: . aspirin  81 mg Oral Daily  . atorvastatin  40 mg Oral q1800  . carvedilol  12.5 mg Oral BID WC  . docusate sodium  100 mg Oral BID  . enoxaparin (LOVENOX) injection  40 mg Subcutaneous Q24H  . febuxostat  40 mg Oral Daily  . furosemide  40 mg Intravenous BID  . ipratropium-albuterol  3 mL Nebulization TID  . isosorbide-hydrALAZINE  1.5 tablet Oral BID  . polyethylene glycol  17 g Oral Daily   Continuous Infusions:   Time: 30 minutes  Barton Dubois  [facility] Pager [phone]. If 7PM-7AM, please contact night-coverage at [email or web address], password [facility] 03/09/2015, 1:02 PM

## 2015-03-10 DIAGNOSIS — I10 Essential (primary) hypertension: Secondary | ICD-10-CM

## 2015-03-10 DIAGNOSIS — R0602 Shortness of breath: Secondary | ICD-10-CM

## 2015-03-10 LAB — BASIC METABOLIC PANEL
Anion gap: 7 (ref 5–15)
BUN: 19 mg/dL (ref 6–20)
CHLORIDE: 97 mmol/L — AB (ref 101–111)
CO2: 40 mmol/L — ABNORMAL HIGH (ref 22–32)
CREATININE: 1.66 mg/dL — AB (ref 0.61–1.24)
Calcium: 8.3 mg/dL — ABNORMAL LOW (ref 8.9–10.3)
GFR calc Af Amer: 53 mL/min — ABNORMAL LOW (ref >60)
GFR, EST NON AFRICAN AMERICAN: 46 mL/min — AB (ref >60)
GLUCOSE: 85 mg/dL (ref 65–99)
Potassium: 3.3 mmol/L — ABNORMAL LOW (ref 3.5–5.1)
SODIUM: 144 mmol/L (ref 135–145)

## 2015-03-10 NOTE — Progress Notes (Signed)
Pt wants CPAP at around 2300, RT to monitor and assess as needed.

## 2015-03-10 NOTE — Progress Notes (Signed)
RT placed patient on CPAP. Patient home setting is 12 cmH2O. Patient is tolerating well. RT will continue to monitor.

## 2015-03-10 NOTE — Progress Notes (Signed)
[facility] PROGRESS NOTE Interim History: 53 y/o male who has PMH of Hypertension; Gout; OSA on CPAP; Morbid obesity ([place]); Pulmonary embolism ([place]); PNA (pneumonia); and CHF (congestive heart failure) ([place]). Presented with atypical CP and SOB. Found to have approx 10 pounds weight gain since last discharge and has signs of fluid overload.    Filed Weights   03/07/15 2126 03/09/15 0511 03/10/15 0639  Weight: 188.696 kg (416 lb) 188.061 kg (414 lb 9.6 oz) 187.698 kg (413 lb 12.8 oz)        Intake/Output Summary (Last 24 hours) at 03/10/15 1128 Last data filed at 03/10/15 1045  Gross per 24 hour  Intake    360 ml  Output    950 ml  Net   -590 ml     Assessment/Plan: 1-Chest pain: atypical, but with risk factors. -troponin flat elevation and normalization after IV lasix given -most likely demand ischemia from CHF exacerbation -no ischemic changes on EKG -Patient is pain free now; will monitor -continue tx for CHF -will continue b-blocker, continue ASA, continue statins and BIDIL  2-acute on chronic systolic HF: with mild hypoxia -last EF 40-45% -will continue low sodium diet -continue IV lasix -check daily weight and strict intake/output -will continue B-blocker and BIDIL -close follow up of his renal function and electrolytes   4-hypoxia: multifactorial: OSA, OHS and CHF exacerbation -recently recovering from acute bronchitis -will continue treatment for CHF exacerbation -low calorie diet and exercise discussed with patient -will continue CPAP QHS per RT  5-Obesity: Body mass index is 61.08 kg/(m^2). -discussed with patient about low calorie diet and exercise   6-Hypertensive urgency:  -will continue coreg (dose adjusted to 12.5mg  BID), will also continue lasix and will increase BIDIL dose -will follow VS and adjust antihypertensive regimen as needed -continue low sodium diet  7-CKD (chronic kidney disease), stage III -Cr currently at baseline  -will  monitor with ongoing IV diuresis   8-OSA: will continue QHS CPAP   Code Status: Full Family Communication: no family at bedside Disposition Plan: continue IV lasix for another 24 hours, follow volume status and adjust medications for heart failure.   Consultants:  None   Procedures: ECHO: recently done; demonstrated EF 40-45%  Antibiotics:  levaquin (last dose on 12/15)  HPI/Subjective: No fever. Denies CP; reports breathing is much better and endorses that he is urinating a lot.  Objective: Filed Vitals:   03/09/15 2018 03/09/15 2118 03/10/15 0639 03/10/15 0836  BP:  158/76 165/82   Pulse:  67 72   Temp:  98.7 F (37.1 C) 99 F (37.2 C)   TempSrc:  Oral Oral   Resp:  20 20   Height:      Weight:   187.698 kg (413 lb 12.8 oz)   SpO2: 98% 99% 99% 99%     Exam:  General: Alert, awake, oriented x3, in no acute distress. Denies CP and endorses improvement in his breathing.  HEENT: No bruits, no goiter. Very mild JVD on exam appreciated Heart: Regular rate and rhythm, no rubs or gallops on exam Lungs: improve air movement, decrease BS at bases still; no frank crackles, no wheezing  Abdomen: Soft, nontender, nondistended, positive bowel sounds.  Neuro: Grossly intact, nonfocal.   Data Reviewed: Basic Metabolic Panel:  Recent Labs Lab 03/07/15 1806 03/09/15 0423 03/10/15 0415  NA 142 144 144  K 3.5 3.6 3.3*  CL 101 100* 97*  CO2 34* 37* 40*  GLUCOSE 93 98 85  BUN 14 18  19  CREATININE 1.51* 1.61* 1.66*  CALCIUM 8.6* 8.7* 8.3*   Liver Function Tests:  Recent Labs Lab 03/07/15 1806  AST 23  ALT 14*  ALKPHOS 72  BILITOT 0.5  PROT 6.4*  ALBUMIN 3.6   CBC:  Recent Labs Lab 03/07/15 1806  WBC 5.5  NEUTROABS 3.3  HGB 12.8*  HCT 41.1  MCV 89.3  PLT 187   Cardiac Enzymes:  Recent Labs Lab 03/07/15 2052 03/08/15 0300 03/08/15 0940  TROPONINI 0.04* 0.04* 0.03   BNP (last 3 results)  Recent Labs  12/04/14 0538 02/20/15 1757  03/07/15 1806  BNP 185.8* 194.3* 282.4*    Studies: No results found.  Scheduled Meds: . aspirin  81 mg Oral Daily  . atorvastatin  40 mg Oral q1800  . carvedilol  12.5 mg Oral BID WC  . docusate sodium  100 mg Oral BID  . enoxaparin (LOVENOX) injection  40 mg Subcutaneous Q24H  . febuxostat  40 mg Oral Daily  . furosemide  40 mg Intravenous BID  . ipratropium-albuterol  3 mL Nebulization TID  . isosorbide-hydrALAZINE  1.5 tablet Oral BID  . polyethylene glycol  17 g Oral Daily   Continuous Infusions:   Time: 30 minutes  Barton Dubois  [facility] Pager [phone]. If 7PM-7AM, please contact night-coverage at [email or web address], password [facility] 03/10/2015, 11:28 AM  LOS: 1 day

## 2015-03-11 LAB — BASIC METABOLIC PANEL
ANION GAP: 8 (ref 5–15)
BUN: 19 mg/dL (ref 6–20)
CHLORIDE: 99 mmol/L — AB (ref 101–111)
CO2: 39 mmol/L — ABNORMAL HIGH (ref 22–32)
CREATININE: 1.57 mg/dL — AB (ref 0.61–1.24)
Calcium: 8.7 mg/dL — ABNORMAL LOW (ref 8.9–10.3)
GFR calc non Af Amer: 49 mL/min — ABNORMAL LOW (ref >60)
GFR, EST AFRICAN AMERICAN: 57 mL/min — AB (ref >60)
Glucose, Bld: 95 mg/dL (ref 65–99)
Potassium: 3.3 mmol/L — ABNORMAL LOW (ref 3.5–5.1)
SODIUM: 146 mmol/L — AB (ref 135–145)

## 2015-03-11 MED ORDER — CARVEDILOL 12.5 MG PO TABS: 12.5000 mg | ORAL_TABLET | Freq: Two times a day (BID) | ORAL | Status: DC

## 2015-03-11 MED ORDER — FUROSEMIDE 40 MG PO TABS: 60.0000 mg | ORAL_TABLET | Freq: Two times a day (BID) | ORAL | Status: DC

## 2015-03-11 MED ORDER — POTASSIUM CHLORIDE CRYS ER 20 MEQ PO TBCR
40.0000 meq | EXTENDED_RELEASE_TABLET | ORAL | Status: AC
  Administered 2015-03-11 (×2): 40 meq via ORAL
  Filled 2015-03-11 (×2): qty 2

## 2015-03-11 MED ORDER — IPRATROPIUM-ALBUTEROL 0.5-2.5 (3) MG/3ML IN SOLN: 3.0000 mL | Freq: Two times a day (BID) | RESPIRATORY_TRACT | Status: DC

## 2015-03-11 MED ORDER — ISOSORB DINITRATE-HYDRALAZINE 20-37.5 MG PO TABS: 1.5000 | ORAL_TABLET | Freq: Two times a day (BID) | ORAL | Status: DC

## 2015-03-11 NOTE — Discharge Summary (Signed)
Physician Discharge Summary  Phillip [name] [id number] DOB: 1961-03-04 DOA: 03/07/2015  PCP: [name], MD  Admit date: 03/07/2015 Discharge date: 03/11/2015  Time spent: 40 minutes  Recommendations for Outpatient Follow-up:  1. BMET to follow electrolytes and renal function 2. Reassess BP and adjust antihypertensive as needed 3. Needs further titration on his HF medications    BNP    Component Value Date/Time   PROBNP 36.9 05/14/2010 1430                Discharge Diagnoses:  Active Problems:   OSA on CPAP   Hypoxia   CKD (chronic kidney disease), stage III   Hypertension   SOB (shortness of breath)   Chest pain   Obesity   Hypertensive urgency   Chronic combined systolic (congestive) and diastolic (congestive) heart failure (HCC)   Acute on chronic systolic HF (heart failure) ([place])   Discharge Condition: stable and improved. Patient discharge home not needing oxygen and with ability to ambulate on hallways w/o feeling SOB. Will follow with PCP in 1 week and will set up appointment with heart failure clinic in 10 days  Diet recommendation: heart healthy and low calorie diet    History of present illness:  54 y.o. male has a past medical history of Hypertension; Gout; OSA on CPAP; Morbid obesity (Phillip [name]); Pulmonary embolism (Phillip [name]); PNA (pneumonia); and CHF (congestive heart failure) (Phillip [name]). Who presented with Chest discomfort since yesterday it was coming and going. Started at rest while laying on his front. When he rolled he felt it in his shoulders and then left arm including his fingers. Today at 2 PM he had chest pain level 3 felt like someone was standing on his chest. This sensation lasted one hours and currently resolved. Initial troponin is WNL ECG non-ischemic. Reports having stress test done in the past 1 year denies any fever or chills no syncope Patient has been on 2 L of oxygen at baseline for almost 1 year.  He states he was recommended to be on  oxygen 3 years ago but did not feel like he needed that until past year. Patient have been having increased cough since developing sinus infection he was started empirically on Levaquin.   Hospital Course:  1-Chest pain: atypical, but with risk factors. -troponin flat elevation and normalization after IV lasix given -most likely demand ischemia from CHF exacerbation -no ischemic changes on EKG -Patient is pain free at discharge -continue tx for CHF -will continue b-blocker, continue ASA, continue statins and BIDIL  2-acute on chronic systolic HF: with mild hypoxia -last EF 40-45% -will continue low sodium diet -will discharge on lasix BID 60mg  and also on coreg 12.5 mg BID and BIDIL -unable to use ACE or ARB due to renal failure -needs follow up with heart failure clinic -check daily weight  4-hypoxia: multifactorial: OSA, OHS and CHF exacerbation -recently recovering from acute bronchitis -will continue treatment for CHF  -low calorie diet and exercise discussed with patient -will continue CPAP QHS   5-Obesity: Body mass index is 61.08 kg/(m^2). -discussed with patient about low calorie diet and exercise   6-Hypertensive urgency/essential HTN  -will discharge on Coreg (dose adjusted to 12.5mg  BID), will also continue lasix, but now 60mg  BID and will discharge on 1.5 tablets of BIDIL twice a day -will recommend close follow of his VS and adjust antihypertensive regimen as needed -instructed to follow low sodium diet  7-CKD (chronic kidney disease), stage III -Cr currently at baseline  -  will recommend close follow up; especially with adjustment to diuretics   8-OSA: will continue QHS CPAP  9-HLD: continue statins   Procedures:  See below for x-ray reports   ECHO: recently done; demonstrated EF 40-45%  Consultations:  None   Discharge Exam: Filed Vitals:   03/11/15 0605 03/11/15 1352  BP: 145/59 146/59  Pulse: 70 71  Temp: 98.6 F (37 C) 98.6 F (37 C)    Resp: 20 20   General: Alert, awake, oriented x3, in no acute distress. Denies CP and endorses improvement in his breathing. No needs for O2 supplementation HEENT: No bruits, no goiter. Very mild JVD on exam appreciated (difficult to properly assess due to body habitus) Heart: Regular rate and rhythm, no rubs or gallops on exam Lungs: improve air movement, decrease BS at bases still; no frank crackles, no wheezing  Abdomen: Soft, nontender, nondistended, positive bowel sounds.  Neuro: Grossly intact, nonfocal.   Discharge Instructions  Discharge Instructions    Diet - low sodium heart healthy    Complete by:  As directed      Discharge instructions    Complete by:  As directed   Take medications as prescribed Follow low sodium diet (less 2 gram daily) Check weight on daily basis (red flag if you gain more than 3 pounds overnight and/or more than 5 pounds in a week) Maintain adequate hydration Use your CPAP every night (at least 8 hours)            Medication List    STOP taking these medications        levofloxacin 500 MG tablet  Commonly known as:  LEVAQUIN      TAKE these medications        albuterol 108 (90 BASE) MCG/ACT inhaler  Commonly known as:  PROVENTIL HFA;VENTOLIN HFA  Inhale 2 puffs into the lungs every 6 (six) hours as needed for shortness of breath.     aspirin 81 MG chewable tablet  Chew 1 tablet (81 mg total) by mouth daily.     atorvastatin 40 MG tablet  Commonly known as:  LIPITOR  Take 1 tablet (40 mg total) by mouth daily at 6 PM.     carvedilol 12.5 MG tablet  Commonly known as:  COREG  Take 1 tablet (12.5 mg total) by mouth 2 (two) times daily with a meal.     colchicine 0.6 MG tablet  Take 0.6 mg by mouth daily as needed. For gout     febuxostat 40 MG tablet  Commonly known as:  ULORIC  Take 1 tablet (40 mg total) by mouth daily.     furosemide 40 MG tablet  Commonly known as:  LASIX  Take 1.5 tablets (60 mg total) by mouth 2  (two) times daily.     isosorbide-hydrALAZINE 20-37.5 MG tablet  Commonly known as:  BIDIL  Take 1.5 tablets by mouth 2 (two) times daily.       No Known Allergies     Follow-up Information    Follow up with [name], MD. Schedule an appointment as soon as possible for a visit in 1 week.   Specialty:  Internal Medicine   Contact information:   [address] [place] [address] [phone]       Call [place].   Specialty:  Cardiology   Why:  to set up appointment in 10 days (Dr. [facility])   Contact information:   [address] [id number] mc [place] [phone]      The results of significant diagnostics from this hospitalization (including imaging, microbiology, ancillary and laboratory) are listed below for reference.    Significant Diagnostic Studies: Dg Chest 2 View  03/07/2015  CLINICAL DATA:  Left-sided chest pain beginning this afternoon. Worsening shortness of breath and fatigue for 2 days. Chronic kidney disease and congestive heart failure. EXAM: CHEST  2 VIEW COMPARISON:  02/20/2015 FINDINGS: Mild cardiomegaly stable. Both lungs are clear. No evidence of pneumothorax or pleural effusion. IMPRESSION: Mild cardiomegaly.  No active lung disease. Electronically Signed   By: Earle Gell M.D.   On: 03/07/2015 18:04   Dg Chest 2 View  02/20/2015  CLINICAL DATA:  Shortness of breath for 3 months worse in past 2 weeks, shortness of breath after normal every day activities, history obesity, pulmonary embolism, pneumonia, bronchitis, hypertension EXAM: CHEST  2 VIEW COMPARISON:  12/04/2014 FINDINGS: Enlargement of cardiac silhouette. Mediastinal contours and pulmonary vascularity normal. Lungs clear. No pleural effusion or pneumothorax. Bones unremarkable. IMPRESSION: Mild enlargement of cardiac silhouette. No acute abnormalities. Electronically Signed   By: Lavonia Dana M.D.    On: 02/20/2015 17:46   Ct Angio Chest Pe W/cm &/or Wo Cm  02/20/2015  CLINICAL DATA:  Shortness of breath since last week. History of pneumonia and pulmonary embolism. EXAM: CT ANGIOGRAPHY CHEST WITH CONTRAST TECHNIQUE: Multidetector CT imaging of the chest was performed using the standard protocol during bolus administration of intravenous contrast. Multiplanar CT image reconstructions and MIPs were obtained to evaluate the vascular anatomy. CONTRAST:  124mL OMNIPAQUE IOHEXOL 350 MG/ML SOLN COMPARISON:  05/06/2014 FINDINGS: THORACIC INLET/BODY WALL: No acute abnormality. MEDIASTINUM: Mild cardiomegaly. No pericardial effusion. Suboptimal opacification of pulmonary arteries due to patient size and bolus dispersion. There is also intermittent respiratory motion. No evidence of pulmonary embolism with certainty mainly limited to the lobar level and proximal. Negative aorta. No adenopathy. LUNG WINDOWS: No consolidation. No effusion. 5 mm right middle lobe pulmonary nodule is stable over multiple years and benign. UPPER ABDOMEN: No acute findings. OSSEOUS: No acute fracture.  No suspicious lytic or blastic lesions. Review of the MIP images confirms the above findings. IMPRESSION: No evidence of pulmonary embolism, but significantly limited beyond the lobar level due to patient factors. Consider lower extremity Doppler in this patient with reported leg swelling. Electronically Signed   By: Monte Fantasia M.D.   On: 02/20/2015 20:02   Nm Pulmonary Perf And Vent  02/21/2015  CLINICAL DATA:  Shortness of breath. EXAM: NUCLEAR MEDICINE VENTILATION - PERFUSION LUNG SCAN TECHNIQUE: Ventilation images were obtained in multiple projections using inhaled aerosol Tc-49m DTPA. Perfusion images were obtained in multiple projections after intravenous injection of Tc-64m MAA. RADIOPHARMACEUTICALS:  32.0 Technetium-31m DTPA aerosol inhalation and 4.0 Technetium-39m MAA IV COMPARISON:  Chest x-ray 02/20/2015.  Chest CT  02/20/2015 FINDINGS: Elevation of the right hemidiaphragm again noted. Minimal ventilatory defects noted, possibly related to mild atelectasis. No perfusion defects noted. IMPRESSION: Low probability scan. No evidence of pulmonary embolus. No perfusion defects noted. Minimal ventilation defects noted, possibly related to mild atelectasis . Electronically Signed   By: Marcello Moores  Register   On: 02/21/2015 10:47   Labs: Basic Metabolic Panel:  Recent Labs Lab 03/07/15 1806 03/09/15 0423 03/10/15 0415 03/11/15 0521  NA 142 144 144 146*  K 3.5 3.6 3.3* 3.3*  CL 101 100* 97* 99*  CO2 34* 37* 40* 39*  GLUCOSE 93 98 85 95  BUN 14  18 19 19   CREATININE 1.51* 1.61* 1.66* 1.57*  CALCIUM 8.6* 8.7* 8.3* 8.7*   Liver Function Tests:  Recent Labs Lab 03/07/15 1806  AST 23  ALT 14*  ALKPHOS 72  BILITOT 0.5  PROT 6.4*  ALBUMIN 3.6   CBC:  Recent Labs Lab 03/07/15 1806  WBC 5.5  NEUTROABS 3.3  HGB 12.8*  HCT 41.1  MCV 89.3  PLT 187   Cardiac Enzymes:  Recent Labs Lab 03/07/15 2052 03/08/15 0300 03/08/15 0940  TROPONINI 0.04* 0.04* 0.03   BNP: BNP (last 3 results)  Recent Labs  12/04/14 0538 02/20/15 1757 03/07/15 1806  BNP 185.8* 194.3* 282.4*    Signed:  Barton Dubois  [facility] 03/11/2015, 4:18 PM

## 2015-05-18 ENCOUNTER — Inpatient Hospital Stay (HOSPITAL_COMMUNITY)
Admission: EM | Admit: 2015-05-18 | Discharge: 2015-05-23 | DRG: 202 | Disposition: A | Discharge status: Home / Self Care | Payer: Managed Care, Other (non HMO) | Attending: Internal Medicine | Admitting: Internal Medicine

## 2015-05-18 ENCOUNTER — Encounter (HOSPITAL_COMMUNITY): Payer: Self-pay

## 2015-05-18 ENCOUNTER — Emergency Department (HOSPITAL_COMMUNITY): Payer: Managed Care, Other (non HMO)

## 2015-05-18 DIAGNOSIS — J9600 Acute respiratory failure, unspecified whether with hypoxia or hypercapnia: Secondary | ICD-10-CM | POA: Diagnosis not present

## 2015-05-18 DIAGNOSIS — B974 Respiratory syncytial virus as the cause of diseases classified elsewhere: Secondary | ICD-10-CM | POA: Diagnosis present

## 2015-05-18 DIAGNOSIS — I129 Hypertensive chronic kidney disease with stage 1 through stage 4 chronic kidney disease, or unspecified chronic kidney disease: Secondary | ICD-10-CM | POA: Diagnosis present

## 2015-05-18 DIAGNOSIS — N184 Chronic kidney disease, stage 4 (severe): Secondary | ICD-10-CM | POA: Diagnosis present

## 2015-05-18 DIAGNOSIS — Z9989 Dependence on other enabling machines and devices: Secondary | ICD-10-CM

## 2015-05-18 DIAGNOSIS — Z6841 Body Mass Index (BMI) 40.0 and over, adult: Secondary | ICD-10-CM

## 2015-05-18 DIAGNOSIS — R197 Diarrhea, unspecified: Secondary | ICD-10-CM | POA: Diagnosis present

## 2015-05-18 DIAGNOSIS — Z23 Encounter for immunization: Secondary | ICD-10-CM

## 2015-05-18 DIAGNOSIS — M109 Gout, unspecified: Secondary | ICD-10-CM | POA: Diagnosis present

## 2015-05-18 DIAGNOSIS — J9601 Acute respiratory failure with hypoxia: Secondary | ICD-10-CM | POA: Diagnosis present

## 2015-05-18 DIAGNOSIS — Z86711 Personal history of pulmonary embolism: Secondary | ICD-10-CM

## 2015-05-18 DIAGNOSIS — I5042 Chronic combined systolic (congestive) and diastolic (congestive) heart failure: Secondary | ICD-10-CM | POA: Diagnosis present

## 2015-05-18 DIAGNOSIS — I509 Heart failure, unspecified: Secondary | ICD-10-CM | POA: Insufficient documentation

## 2015-05-18 DIAGNOSIS — J45901 Unspecified asthma with (acute) exacerbation: Secondary | ICD-10-CM | POA: Diagnosis not present

## 2015-05-18 DIAGNOSIS — Z79899 Other long term (current) drug therapy: Secondary | ICD-10-CM

## 2015-05-18 DIAGNOSIS — G4733 Obstructive sleep apnea (adult) (pediatric): Secondary | ICD-10-CM | POA: Diagnosis present

## 2015-05-18 DIAGNOSIS — N179 Acute kidney failure, unspecified: Secondary | ICD-10-CM | POA: Diagnosis present

## 2015-05-18 DIAGNOSIS — E785 Hyperlipidemia, unspecified: Secondary | ICD-10-CM | POA: Diagnosis present

## 2015-05-18 DIAGNOSIS — N183 Chronic kidney disease, stage 3 (moderate): Secondary | ICD-10-CM | POA: Diagnosis present

## 2015-05-18 DIAGNOSIS — E860 Dehydration: Secondary | ICD-10-CM | POA: Diagnosis present

## 2015-05-18 DIAGNOSIS — Z8249 Family history of ischemic heart disease and other diseases of the circulatory system: Secondary | ICD-10-CM

## 2015-05-18 DIAGNOSIS — Z833 Family history of diabetes mellitus: Secondary | ICD-10-CM

## 2015-05-18 DIAGNOSIS — R0602 Shortness of breath: Secondary | ICD-10-CM | POA: Diagnosis not present

## 2015-05-18 DIAGNOSIS — E876 Hypokalemia: Secondary | ICD-10-CM | POA: Diagnosis not present

## 2015-05-18 DIAGNOSIS — I10 Essential (primary) hypertension: Secondary | ICD-10-CM | POA: Diagnosis present

## 2015-05-18 DIAGNOSIS — I471 Supraventricular tachycardia: Secondary | ICD-10-CM | POA: Diagnosis present

## 2015-05-18 DIAGNOSIS — Z7982 Long term (current) use of aspirin: Secondary | ICD-10-CM

## 2015-05-18 LAB — BASIC METABOLIC PANEL
Anion gap: 10 (ref 5–15)
BUN: 18 mg/dL (ref 6–20)
CHLORIDE: 101 mmol/L (ref 101–111)
CO2: 30 mmol/L (ref 22–32)
Calcium: 9.1 mg/dL (ref 8.9–10.3)
Creatinine, Ser: 1.7 mg/dL — ABNORMAL HIGH (ref 0.61–1.24)
GFR, EST AFRICAN AMERICAN: 52 mL/min — AB (ref >60)
GFR, EST NON AFRICAN AMERICAN: 45 mL/min — AB (ref >60)
Glucose, Bld: 100 mg/dL — ABNORMAL HIGH (ref 65–99)
POTASSIUM: 3.2 mmol/L — AB (ref 3.5–5.1)
SODIUM: 141 mmol/L (ref 135–145)

## 2015-05-18 LAB — CBC WITH DIFFERENTIAL/PLATELET
BASOS ABS: 0 10*3/uL (ref 0.0–0.1)
Basophils Relative: 1 %
EOS ABS: 0.1 10*3/uL (ref 0.0–0.7)
EOS PCT: 2 %
HCT: 41.9 % (ref 39.0–52.0)
HEMOGLOBIN: 12.7 g/dL — AB (ref 13.0–17.0)
LYMPHS ABS: 1.5 10*3/uL (ref 0.7–4.0)
LYMPHS PCT: 34 %
MCH: 26.5 pg (ref 26.0–34.0)
MCHC: 30.3 g/dL (ref 30.0–36.0)
MCV: 87.5 fL (ref 78.0–100.0)
Monocytes Absolute: 0.5 10*3/uL (ref 0.1–1.0)
Monocytes Relative: 12 %
NEUTROS PCT: 51 %
Neutro Abs: 2.3 10*3/uL (ref 1.7–7.7)
PLATELETS: 166 10*3/uL (ref 150–400)
RBC: 4.79 MIL/uL (ref 4.22–5.81)
RDW: 13.7 % (ref 11.5–15.5)
WBC: 4.4 10*3/uL (ref 4.0–10.5)

## 2015-05-18 LAB — I-STAT TROPONIN, ED: Troponin i, poc: 0.02 ng/mL (ref 0.00–0.08)

## 2015-05-18 LAB — BRAIN NATRIURETIC PEPTIDE: B NATRIURETIC PEPTIDE 5: 429.7 pg/mL — AB (ref 0.0–100.0)

## 2015-05-18 MED ORDER — IPRATROPIUM BROMIDE 0.02 % IN SOLN
0.5000 mg | Freq: Once | RESPIRATORY_TRACT | Status: AC
  Administered 2015-05-18: 0.5 mg via RESPIRATORY_TRACT
  Filled 2015-05-18: qty 2.5

## 2015-05-18 MED ORDER — ALBUTEROL SULFATE (2.5 MG/3ML) 0.083% IN NEBU
5.0000 mg | INHALATION_SOLUTION | Freq: Once | RESPIRATORY_TRACT | Status: AC
  Administered 2015-05-18: 5 mg via RESPIRATORY_TRACT
  Filled 2015-05-18: qty 6

## 2015-05-18 NOTE — ED Provider Notes (Signed)
CSN: [id number]     Arrival date & time 05/18/15  1758 History   First MD Initiated Contact with Patient 05/18/15 2052     Chief Complaint  Patient presents with  . Shortness of Breath   Phillip [name] is a 55 y.o. male with a history of CHF and asthma who presents to the emergency department complaining of increasing cough and wheezing since yesterday. She also reports chest pain and tightness that is worse with coughing. He currently contains a very mild substernal nonradiating chest pain. He reports feeling slightly short of breath currently. He endorses some wheezing. He endorses some slight increased ankle edema. He has been taking his torsemide as prescribed. He denies fevers, body aches, palpitations, leg pain, dizziness, lightheadedness, syncope, sore throat, trouble swallowing or rashes.    Patient is a 55 y.o. male presenting with shortness of breath. The history is provided by the patient. No language interpreter was used.  Shortness of Breath Associated symptoms: chest pain, cough and wheezing   Associated symptoms: no abdominal pain, no fever, no headaches, no neck pain, no rash, no sore throat and no vomiting     Past Medical History  Diagnosis Date  . Hypertension   . Gout   . OSA on CPAP   . Morbid obesity ([place])   . Pulmonary embolism ([place])   . PNA (pneumonia)   . CHF (congestive heart failure) [facility])    Past Surgical History  Procedure Laterality Date  . Hernia repair     Family History  Problem Relation Age of Onset  . Diabetes Father   . CAD Father   . Hypertension Other   . Diabetes Other    Social History  Substance Use Topics  . Smoking status: Never Smoker   . Smokeless tobacco: None  . Alcohol Use: No    Review of Systems  Constitutional: Negative for fever and chills.  HENT: Negative for congestion and sore throat.   Eyes: Negative for visual disturbance.  Respiratory: Positive for cough, chest tightness, shortness of breath and wheezing.    Cardiovascular: Positive for chest pain. Negative for palpitations.  Gastrointestinal: Negative for nausea, vomiting and abdominal pain.  Genitourinary: Negative for dysuria.  Musculoskeletal: Negative for back pain and neck pain.  Skin: Negative for rash.  Neurological: Negative for dizziness, syncope, light-headedness and headaches.      Allergies  Review of patient's allergies indicates no known allergies.  Home Medications   Prior to Admission medications   Medication Sig Start Date End Date Taking? Authorizing Provider  albuterol (PROVENTIL HFA;VENTOLIN HFA) 108 (90 BASE) MCG/ACT inhaler Inhale 2 puffs into the lungs every 6 (six) hours as needed for shortness of breath. 05/09/14  Yes Modena Jansky, MD  aspirin 81 MG chewable tablet Chew 1 tablet (81 mg total) by mouth daily. 02/22/15  Yes Florencia Reasons, MD  carvedilol (COREG) 12.5 MG tablet Take 1 tablet (12.5 mg total) by mouth 2 (two) times daily with a meal. 03/11/15  Yes Barton Dubois, MD  cloNIDine (CATAPRES) 0.1 MG tablet Take 0.1 mg by mouth daily.   Yes Historical Provider, MD  colchicine 0.6 MG tablet Take 0.6 mg by mouth daily as needed. For gout   Yes Historical Provider, MD  guaifenesin (ROBITUSSIN) 100 MG/5ML syrup Take 200 mg by mouth 3 (three) times daily as needed for cough.   Yes Historical Provider, MD  isosorbide-hydrALAZINE (BIDIL) 20-37.5 MG tablet Take 1.5 tablets by mouth 2 (two) times daily. 03/11/15  Yes Clifton James  Dyann Kief, MD  torsemide (DEMADEX) 20 MG tablet Take 40 mg by mouth daily.   Yes Historical Provider, MD  atorvastatin (LIPITOR) 40 MG tablet Take 1 tablet (40 mg total) by mouth daily at 6 PM. Patient not taking: Reported on 05/18/2015 02/22/15   Florencia Reasons, MD  febuxostat (ULORIC) 40 MG tablet Take 1 tablet (40 mg total) by mouth daily. Patient not taking: Reported on 05/18/2015 02/22/15   Florencia Reasons, MD  furosemide (LASIX) 40 MG tablet Take 1.5 tablets (60 mg total) by mouth 2 (two) times daily. Patient not  taking: Reported on 05/18/2015 03/11/15   Barton Dubois, MD   BP 187/88 mmHg  Pulse 71  Temp(Src) 99.3 F (37.4 C) (Oral)  Resp 23  SpO2 98% Physical Exam  Constitutional: He appears well-developed and well-nourished. No distress.  Nontoxic appearing obese male.  HENT:  Head: Normocephalic and atraumatic.  Right Ear: External ear normal.  Left Ear: External ear normal.  Mouth/Throat: Oropharynx is clear and moist. No oropharyngeal exudate.  Eyes: Conjunctivae are normal. Pupils are equal, round, and reactive to light. Right eye exhibits no discharge. Left eye exhibits no discharge.  Neck: Normal range of motion. Neck supple. No JVD present. No tracheal deviation present.  Cardiovascular: Normal rate, regular rhythm, normal heart sounds and intact distal pulses.  Exam reveals no gallop and no friction rub.   No murmur heard. Heart rate is 68. Bilateral radial pulses are intact. Good capillary refill.  Pulmonary/Chest: Effort normal. No respiratory distress. He has wheezes. He has no rales. He exhibits no tenderness.  Lung sounds slightly diminished bilaterally with scattered wheezes. Symmetric chest expansion bilaterally. No chest wall tenderness.  Abdominal: Soft. There is no tenderness. There is no rebound and no guarding.  Musculoskeletal: He exhibits edema. He exhibits no tenderness.  Mild ankle edema noted bilaterally. No calf edema or tenderness.  Lymphadenopathy:    He has no cervical adenopathy.  Neurological: He is alert. Coordination normal.  Skin: Skin is warm and dry. No rash noted. He is not diaphoretic. No erythema. No pallor.  Psychiatric: He has a normal mood and affect. His behavior is normal.  Nursing note and vitals reviewed.   ED Course  Procedures (including critical care time) Labs Review Labs Reviewed  BASIC METABOLIC PANEL - Abnormal; Notable for the following:    Potassium 3.2 (*)    Glucose, Bld 100 (*)    Creatinine, Ser 1.70 (*)    GFR calc non Af  Amer 45 (*)    GFR calc Af Amer 52 (*)    All other components within normal limits  CBC WITH DIFFERENTIAL/PLATELET - Abnormal; Notable for the following:    Hemoglobin 12.7 (*)    All other components within normal limits  BRAIN NATRIURETIC PEPTIDE - Abnormal; Notable for the following:    B Natriuretic Peptide 429.7 (*)    All other components within normal limits  RESPIRATORY VIRUS PANEL  CULTURE, BLOOD (ROUTINE X 2)  CULTURE, BLOOD (ROUTINE X 2)  CULTURE, EXPECTORATED SPUTUM-ASSESSMENT  GRAM STAIN  INFLUENZA PANEL BY PCR (TYPE A & B, H1N1)  MAGNESIUM  CREATININE, URINE, RANDOM  UREA NITROGEN, URINE  STREP PNEUMONIAE URINARY ANTIGEN  PROTIME-INR  APTT  I-STAT TROPOININ, ED    Imaging Review Dg Chest 2 View  05/18/2015  CLINICAL DATA:  Chest tightness beginning last night. Cough earlier today. Shortness of breath. EXAM: CHEST  2 VIEW COMPARISON:  03/07/2015 FINDINGS: Mild cardiomegaly. No overt edema. No focal airspace opacities or  effusions. No acute bony abnormality. IMPRESSION: Mild cardiomegaly.  No active disease. Electronically Signed   By: Rolm Baptise M.D.   On: 05/18/2015 18:57   I have personally reviewed and evaluated these images and lab results as part of my medical decision-making.   EKG Interpretation   Date/Time:  Friday May 18 2015 18:17:18 EST Ventricular Rate:  67 PR Interval:  158 QRS Duration: 99 QT Interval:  439 QTC Calculation: 463 R Axis:   -28 Text Interpretation:  Sinus rhythm Atrial premature complex Borderline  left axis deviation Abnormal R-wave progression, late transition Abnormal  T, consider ischemia, lateral leads No significant change since last  tracing Confirmed by ALLEN  MD, ANTHONY ([id number]) on 05/18/2015 11:39:53 PM      Filed Vitals:   05/18/15 1807 05/18/15 2137 05/18/15 2138  BP: 191/85 187/88   Pulse: 72 71   Temp: 98.5 F (36.9 C) 99.3 F (37.4 C)   TempSrc: Oral Oral   Resp: 24 23   SpO2: 95% 98% 98%     MDM    Meds given in ED:  Medications  albuterol (PROVENTIL) (2.5 MG/3ML) 0.083% nebulizer solution 5 mg (not administered)  cloNIDine (CATAPRES) tablet 0.1 mg (not administered)  carvedilol (COREG) tablet 12.5 mg (not administered)  isosorbide-hydrALAZINE (BIDIL) 20-37.5 MG per tablet 1.5 tablet (not administered)  aspirin chewable tablet 81 mg (not administered)  colchicine tablet 0.6 mg (not administered)  ipratropium-albuterol (DUONEB) 0.5-2.5 (3) MG/3ML nebulizer solution 3 mL (not administered)  methylPREDNISolone sodium succinate (SOLU-MEDROL) 125 mg/2 mL injection 60 mg (not administered)  dextromethorphan-guaiFENesin (MUCINEX DM) 30-600 MG per 12 hr tablet 1 tablet (not administered)  azithromycin (ZITHROMAX) tablet 500 mg (not administered)    Followed by  azithromycin (ZITHROMAX) tablet 250 mg (not administered)  potassium chloride 20 MEQ/15ML (10%) solution 30 mEq (not administered)  enoxaparin (LOVENOX) injection 40 mg (not administered)  albuterol (PROVENTIL) (2.5 MG/3ML) 0.083% nebulizer solution 5 mg (5 mg Nebulization Given 05/18/15 1816)  albuterol (PROVENTIL) (2.5 MG/3ML) 0.083% nebulizer solution 5 mg (5 mg Nebulization Given 05/18/15 2135)  ipratropium (ATROVENT) nebulizer solution 0.5 mg (0.5 mg Nebulization Given 05/18/15 2135)    New Prescriptions   No medications on file    Final diagnoses:  Acute on chronic congestive heart failure, unspecified congestive heart failure type [facility])   This is a 55 y.o. male with a history of CHF and asthma who presents to the emergency department complaining of increasing cough and wheezing since yesterday. She also reports chest pain and tightness that is worse with coughing. He currently contains a very mild substernal nonradiating chest pain. He reports feeling slightly short of breath currently. He endorses some wheezing. He endorses some slight increased ankle edema. On exam the patient is afebrile and nontoxic-appearing. He is  slightly diminished lung sounds bilaterally with scattered wheezes. He reports shortness of breath. He reports no relief after first breathing treatment. Will obtain chest x-ray, blood work and provide a second breathing treatment and reevaluate. Chest x-ray indicates mild cardiomegaly and no active disease. CBC is unremarkable. BNP is elevated at 429 which is the highest his is ever been. BMP shows an elevated creatinine of 1.70 which is also elevated for him. Patient likely CHF exacerbation. He reports no relief after second breathing treatment. Will admit for diuresis and continued workup. Patient is in agreement with admission.  I consulted with Dr. Blaine Hamper who accepted the patient for admission requested telemetry temporary admission orders.  This patient was discussed with Dr.  Zenia Resides who agrees with assessment and plan.     Waynetta Pean, PA-C 05/19/15 0148  Lacretia Leigh, MD 05/20/15 2224

## 2015-05-18 NOTE — ED Notes (Signed)
Bed: WA15 Expected date:  Expected time:  Means of arrival:  Comments: Hold for triage 5 

## 2015-05-18 NOTE — H&P (Addendum)
[facility] History and Physical  Phillip [name] [id number] DOB: 08-05-1960 DOA: 05/18/2015  Referring physician: ED physician PCP: [name], MD  Specialists:   Chief Complaint: Cough and shortness of breath  HPI: Phillip [name] is a 55 y.o. male with PMH of asthma, combined systolic and diastolic congestive heart failure, hypertension, hyperlipidemia, gout, OSA on CPAP, obesity, PE 2010, chronic kidney disease-stage III, who presents with cough and shortness breath.  Patient reports that he has cough and shortness of breath in the past 2 days. He coughs up yellow colored sputum, no fever or chills. He has some mild chest pain which is introduced by coughing only. At rest, he does not have chest pain. He also has runny nose, no sore throat. Because of severe cough, he vomited once. Patient reports that he had 3 bowel movements with loose stool this today. Currently no diarrhea, nausea, vomiting or abdominal pain. Patient does not have symptoms of UTI or unilateral weakness. He reports that his body weight has decreased from 403-->394 Lbs recently.  In ED, patient was found to have BNP 429.7, negative troponin, WBC 4.4, temperature 99.3, no tachycardia, potassium 3.2, slightly worsening renal function, negative chest x-ray for infiltration. Patient admitted to inpatient for further eval and treatment.  EKG: Independently reviewed. QTC 463, LAD, PA-C, poor R-wave progression, mild T-wave inversion in lateral leads.  Where does patient live?   At home  Can patient participate in ADLs?  Yes   Review of Systems:   General: no fevers, chills, no changes in body weight, has fatigue HEENT: no blurry vision, hearing changes or sore throat Pulm: has dyspnea, coughing, wheezing CV: has chest pain, no palpitations Abd: had nausea, vomiting, diarrhea, no constipation and abdominal pain, GU: no dysuria, burning on urination, increased urinary frequency, hematuria  Ext: trace leg edema Neuro:  no unilateral weakness, numbness, or tingling, no vision change or hearing loss Skin: no rash MSK: No muscle spasm, no deformity, no limitation of range of movement in spin Heme: No easy bruising.  Travel history: No recent long distant travel.  Allergy: No Known Allergies  Past Medical History  Diagnosis Date  . Hypertension   . Gout   . OSA on CPAP   . Morbid obesity ([place])   . Pulmonary embolism ([place])   . PNA (pneumonia)   . CHF (congestive heart failure) [facility])     Past Surgical History  Procedure Laterality Date  . Hernia repair      Social History:  reports that he has never smoked. He does not have any smokeless tobacco history on file. He reports that he does not drink alcohol or use illicit drugs.  Family History:  Family History  Problem Relation Age of Onset  . Diabetes Father   . CAD Father   . Hypertension Other   . Diabetes Other      Prior to Admission medications   Medication Sig Start Date End Date Taking? Authorizing Provider  albuterol (PROVENTIL HFA;VENTOLIN HFA) 108 (90 BASE) MCG/ACT inhaler Inhale 2 puffs into the lungs every 6 (six) hours as needed for shortness of breath. 05/09/14  Yes Modena Jansky, MD  aspirin 81 MG chewable tablet Chew 1 tablet (81 mg total) by mouth daily. 02/22/15  Yes Florencia Reasons, MD  carvedilol (COREG) 12.5 MG tablet Take 1 tablet (12.5 mg total) by mouth 2 (two) times daily with a meal. 03/11/15  Yes Barton Dubois, MD  cloNIDine (CATAPRES) 0.1 MG tablet Take 0.1 mg by  mouth daily.   Yes Historical Provider, MD  colchicine 0.6 MG tablet Take 0.6 mg by mouth daily as needed. For gout   Yes Historical Provider, MD  guaifenesin (ROBITUSSIN) 100 MG/5ML syrup Take 200 mg by mouth 3 (three) times daily as needed for cough.   Yes Historical Provider, MD  isosorbide-hydrALAZINE (BIDIL) 20-37.5 MG tablet Take 1.5 tablets by mouth 2 (two) times daily. 03/11/15  Yes Barton Dubois, MD  torsemide (DEMADEX) 20 MG tablet Take 40 mg by mouth  daily.   Yes Historical Provider, MD  atorvastatin (LIPITOR) 40 MG tablet Take 1 tablet (40 mg total) by mouth daily at 6 PM. Patient not taking: Reported on 05/18/2015 02/22/15   Florencia Reasons, MD  febuxostat (ULORIC) 40 MG tablet Take 1 tablet (40 mg total) by mouth daily. Patient not taking: Reported on 05/18/2015 02/22/15   Florencia Reasons, MD  furosemide (LASIX) 40 MG tablet Take 1.5 tablets (60 mg total) by mouth 2 (two) times daily. Patient not taking: Reported on 05/18/2015 03/11/15   Barton Dubois, MD    Physical Exam: Filed Vitals:   05/18/15 2137 05/18/15 2138 05/19/15 0157 05/19/15 0204  BP: 187/88  193/81 185/78  Pulse: 71  71   Temp: 99.3 F (37.4 C)  99.6 F (37.6 C)   TempSrc: Oral  Oral   Resp: 23     Height:   5\' 5"  (1.651 m)   Weight:    179.851 kg (396 lb 8 oz)  SpO2: 98% 98% 91%    General: Not in acute distress HEENT:       Eyes: PERRL, EOMI, no scleral icterus.       ENT: No discharge from the ears and nose, no pharynx injection, no tonsillar enlargement.        Neck: Difficult to assess JVD due to morbid obesity, no bruit, no mass felt. Heme: No neck lymph node enlargement. Cardiac: S1/S2, RRR, No murmurs, No gallops or rubs. Pulm: Wheezing bilaterally. No rales or rubs. Abd: Soft, nondistended, nontender, no rebound pain, no organomegaly, BS present. Ext: Trace leg edema bilaterally. 2+DP/PT pulse bilaterally. Musculoskeletal: No joint deformities, No joint redness or warmth, no limitation of ROM in spin. Skin: No rashes.  Neuro: Alert, oriented X3, cranial nerves II-XII grossly intact, moves all extremities normally. Psych: Patient is not psychotic, no suicidal or hemocidal ideation.  Labs on Admission:  Basic Metabolic Panel:  Recent Labs Lab 05/18/15 2208  NA 141  K 3.2*  CL 101  CO2 30  GLUCOSE 100*  BUN 18  CREATININE 1.70*  CALCIUM 9.1   Liver Function Tests: No results for input(s): AST, ALT, ALKPHOS, BILITOT, PROT, ALBUMIN in the last 168 hours. No  results for input(s): LIPASE, AMYLASE in the last 168 hours. No results for input(s): AMMONIA in the last 168 hours. CBC:  Recent Labs Lab 05/18/15 2208  WBC 4.4  NEUTROABS 2.3  HGB 12.7*  HCT 41.9  MCV 87.5  PLT 166   Cardiac Enzymes: No results for input(s): CKTOTAL, CKMB, CKMBINDEX, TROPONINI in the last 168 hours.  BNP (last 3 results)  Recent Labs  02/20/15 1757 03/07/15 1806 05/18/15 2208  BNP 194.3* 282.4* 429.7*    ProBNP (last 3 results) No results for input(s): PROBNP in the last 8760 hours.  CBG: No results for input(s): GLUCAP in the last 168 hours.  Radiological Exams on Admission: Dg Chest 2 View  05/18/2015  CLINICAL DATA:  Chest tightness beginning last night. Cough earlier today. Shortness of  breath. EXAM: CHEST  2 VIEW COMPARISON:  03/07/2015 FINDINGS: Mild cardiomegaly. No overt edema. No focal airspace opacities or effusions. No acute bony abnormality. IMPRESSION: Mild cardiomegaly.  No active disease. Electronically Signed   By: Rolm Baptise M.D.   On: 05/18/2015 18:57    Assessment/Plan Principal Problem:   Asthma exacerbation Active Problems:   OSA on CPAP   Hypokalemia   Acute renal failure superimposed on stage 3 chronic kidney disease (HCC)   Acute respiratory failure (HCC)   Hypertension   Gout   Chronic combined systolic and diastolic CHF (congestive heart failure) (HCC)   Diarrhea   Acute respiratory failure due to Asthma exacerbation: Patient has cough, shortness of breath and wheezing, consistent with asthma exacerbation. Patient has trace leg edema, his volume status seems to fine, less likely to have CHF exacerbation. -will admit patient to telemetry bed (due to CHF) for observation -Nebulizers: scheduled Duoneb and prn albuterol -Solu-Medrol 60 mg IV bid -Oral azithromycin for 5 days.  -Mucinex for cough  -Urine S. pneumococcal antigen -Follow up blood culture x2, sputum culture, respiratory virus panel, Flu pcr  Chronic  combined systolic and diastolic congestive heart failure: 2-D echo 02/21/15 showed EF 40-45% with grade 1 diastolic dysfunction. Patient is on torsemide 40 mg daily at home. His BNP is slightly elevated at 429.7, but he has only trace amount of leg edema, clinically does not seem to have CHF exacerbation. Also his renal function is slightly worsening, will not escalate diuretics. -will continue home dose torsemide, starting tomorrow -on aspirin, Coreg and bidil  OSA - on CPAP  Hypokalemia: K= 3.2 on admission. - Repleted - Check Mg level  HTN: -On clonidine, torsemide and coreg -IV hydralazine when necessary  Gout: -continue home Colchicine prn  AoCKD-III: Baseline Cre is 1.5, his Cre 1.7, BUN 18 is on admission, slightly worsening. Likely due to prerenal secondary to dehydration and continuation of diruetics. - Check FeNa - Follow up renal function by BMP - Hold torsemide tonight, starting tomorrow morning  Diarrhea: Patient had 3 bowel movements with loose stool today, currently no diarrhea or abdominal pain. -OBSERVE closely. Recheck C. difficile PCR if patient has diarrhea again (not ordered yet)   DVT ppx:  SQ Lovenox  Code Status: Full code Family Communication: None at bed side. Disposition Plan: Admit to inpatient   Date of Service 05/19/2015    Ivor Costa [facility] Pager [phone]  If 7PM-7AM, please contact night-coverage [email or web address] Password [email or web address] 05/19/2015, 2:14 AM

## 2015-05-18 NOTE — ED Notes (Signed)
Pt with shortness of breath starting yesterday. Asthma.  Out of inhaler.  Chest tight with cough.  No fever

## 2015-05-19 ENCOUNTER — Encounter (HOSPITAL_COMMUNITY): Payer: Self-pay

## 2015-05-19 DIAGNOSIS — R197 Diarrhea, unspecified: Secondary | ICD-10-CM | POA: Diagnosis present

## 2015-05-19 DIAGNOSIS — Z23 Encounter for immunization: Secondary | ICD-10-CM | POA: Diagnosis not present

## 2015-05-19 DIAGNOSIS — M10012 Idiopathic gout, left shoulder: Secondary | ICD-10-CM

## 2015-05-19 DIAGNOSIS — Z79899 Other long term (current) drug therapy: Secondary | ICD-10-CM | POA: Diagnosis not present

## 2015-05-19 DIAGNOSIS — E785 Hyperlipidemia, unspecified: Secondary | ICD-10-CM | POA: Diagnosis present

## 2015-05-19 DIAGNOSIS — E860 Dehydration: Secondary | ICD-10-CM | POA: Diagnosis present

## 2015-05-19 DIAGNOSIS — N183 Chronic kidney disease, stage 3 (moderate): Secondary | ICD-10-CM

## 2015-05-19 DIAGNOSIS — M109 Gout, unspecified: Secondary | ICD-10-CM | POA: Diagnosis present

## 2015-05-19 DIAGNOSIS — Z6841 Body Mass Index (BMI) 40.0 and over, adult: Secondary | ICD-10-CM | POA: Diagnosis not present

## 2015-05-19 DIAGNOSIS — I509 Heart failure, unspecified: Secondary | ICD-10-CM | POA: Diagnosis not present

## 2015-05-19 DIAGNOSIS — J45901 Unspecified asthma with (acute) exacerbation: Secondary | ICD-10-CM | POA: Diagnosis present

## 2015-05-19 DIAGNOSIS — Z86711 Personal history of pulmonary embolism: Secondary | ICD-10-CM | POA: Diagnosis not present

## 2015-05-19 DIAGNOSIS — I129 Hypertensive chronic kidney disease with stage 1 through stage 4 chronic kidney disease, or unspecified chronic kidney disease: Secondary | ICD-10-CM | POA: Diagnosis present

## 2015-05-19 DIAGNOSIS — J9601 Acute respiratory failure with hypoxia: Secondary | ICD-10-CM | POA: Diagnosis present

## 2015-05-19 DIAGNOSIS — B974 Respiratory syncytial virus as the cause of diseases classified elsewhere: Secondary | ICD-10-CM | POA: Diagnosis present

## 2015-05-19 DIAGNOSIS — N179 Acute kidney failure, unspecified: Secondary | ICD-10-CM | POA: Diagnosis present

## 2015-05-19 DIAGNOSIS — Z8249 Family history of ischemic heart disease and other diseases of the circulatory system: Secondary | ICD-10-CM | POA: Diagnosis not present

## 2015-05-19 DIAGNOSIS — Z833 Family history of diabetes mellitus: Secondary | ICD-10-CM | POA: Diagnosis not present

## 2015-05-19 DIAGNOSIS — Z7982 Long term (current) use of aspirin: Secondary | ICD-10-CM | POA: Diagnosis not present

## 2015-05-19 DIAGNOSIS — I471 Supraventricular tachycardia: Secondary | ICD-10-CM | POA: Diagnosis present

## 2015-05-19 DIAGNOSIS — I5042 Chronic combined systolic (congestive) and diastolic (congestive) heart failure: Secondary | ICD-10-CM | POA: Diagnosis present

## 2015-05-19 DIAGNOSIS — J9600 Acute respiratory failure, unspecified whether with hypoxia or hypercapnia: Secondary | ICD-10-CM | POA: Diagnosis not present

## 2015-05-19 DIAGNOSIS — E876 Hypokalemia: Secondary | ICD-10-CM | POA: Diagnosis present

## 2015-05-19 DIAGNOSIS — G4733 Obstructive sleep apnea (adult) (pediatric): Secondary | ICD-10-CM | POA: Diagnosis present

## 2015-05-19 DIAGNOSIS — R0602 Shortness of breath: Secondary | ICD-10-CM | POA: Diagnosis present

## 2015-05-19 LAB — INFLUENZA PANEL BY PCR (TYPE A & B)
H1N1 flu by pcr: NOT DETECTED
INFLAPCR: NEGATIVE
Influenza B By PCR: NEGATIVE

## 2015-05-19 LAB — TROPONIN I
TROPONIN I: 0.04 ng/mL — AB (ref <0.031)
TROPONIN I: 0.03 ng/mL (ref <0.031)
Troponin I: 0.04 ng/mL — ABNORMAL HIGH (ref <0.031)

## 2015-05-19 LAB — STREP PNEUMONIAE URINARY ANTIGEN: Strep Pneumo Urinary Antigen: NEGATIVE

## 2015-05-19 LAB — PROTIME-INR
INR: 1.21 (ref 0.00–1.49)
Prothrombin Time: 15 seconds (ref 11.6–15.2)

## 2015-05-19 LAB — MAGNESIUM: Magnesium: 1.9 mg/dL (ref 1.7–2.4)

## 2015-05-19 LAB — APTT: APTT: 35 s (ref 24–37)

## 2015-05-19 LAB — CREATININE, URINE, RANDOM: Creatinine, Urine: 87.25 mg/dL

## 2015-05-19 MED ORDER — CLONIDINE HCL 0.1 MG PO TABS
0.1000 mg | ORAL_TABLET | Freq: Every day | ORAL | Status: DC
  Administered 2015-05-19 – 2015-05-23 (×5): 0.1 mg via ORAL
  Filled 2015-05-19 (×5): qty 1

## 2015-05-19 MED ORDER — AZITHROMYCIN 250 MG PO TABS
250.0000 mg | ORAL_TABLET | Freq: Every day | ORAL | Status: DC
  Administered 2015-05-20 – 2015-05-21 (×2): 250 mg via ORAL
  Filled 2015-05-19 (×2): qty 1

## 2015-05-19 MED ORDER — IPRATROPIUM-ALBUTEROL 0.5-2.5 (3) MG/3ML IN SOLN
3.0000 mL | Freq: Four times a day (QID) | RESPIRATORY_TRACT | Status: DC
Start: 2015-05-19 — End: 2015-05-23
  Administered 2015-05-19 – 2015-05-23 (×17): 3 mL via RESPIRATORY_TRACT
  Filled 2015-05-19 (×18): qty 3

## 2015-05-19 MED ORDER — ENOXAPARIN SODIUM 40 MG/0.4ML ~~LOC~~ SOLN
40.0000 mg | SUBCUTANEOUS | Status: DC
  Administered 2015-05-19 – 2015-05-23 (×5): 40 mg via SUBCUTANEOUS
  Filled 2015-05-19 (×6): qty 0.4

## 2015-05-19 MED ORDER — IPRATROPIUM-ALBUTEROL 0.5-2.5 (3) MG/3ML IN SOLN
3.0000 mL | RESPIRATORY_TRACT | Status: DC
  Administered 2015-05-19: 3 mL via RESPIRATORY_TRACT
  Filled 2015-05-19: qty 3

## 2015-05-19 MED ORDER — METHYLPREDNISOLONE SODIUM SUCC 125 MG IJ SOLR
60.0000 mg | Freq: Two times a day (BID) | INTRAMUSCULAR | Status: DC
  Administered 2015-05-19 – 2015-05-21 (×5): 60 mg via INTRAVENOUS
  Filled 2015-05-19 (×6): qty 2

## 2015-05-19 MED ORDER — DM-GUAIFENESIN ER 30-600 MG PO TB12
1.0000 | ORAL_TABLET | Freq: Two times a day (BID) | ORAL | Status: DC
  Administered 2015-05-19 – 2015-05-23 (×10): 1 via ORAL
  Filled 2015-05-19 (×12): qty 1

## 2015-05-19 MED ORDER — FUROSEMIDE 10 MG/ML IJ SOLN
40.0000 mg | Freq: Once | INTRAMUSCULAR | Status: AC
  Administered 2015-05-19: 40 mg via INTRAVENOUS
  Filled 2015-05-19: qty 4

## 2015-05-19 MED ORDER — POTASSIUM CHLORIDE CRYS ER 20 MEQ PO TBCR
40.0000 meq | EXTENDED_RELEASE_TABLET | Freq: Once | ORAL | Status: AC
  Administered 2015-05-19: 40 meq via ORAL
  Filled 2015-05-19: qty 2

## 2015-05-19 MED ORDER — FUROSEMIDE 10 MG/ML IJ SOLN: 40.0000 mg | Freq: Once | INTRAMUSCULAR | Status: DC

## 2015-05-19 MED ORDER — CARVEDILOL 12.5 MG PO TABS
12.5000 mg | ORAL_TABLET | Freq: Two times a day (BID) | ORAL | Status: DC
  Administered 2015-05-19 – 2015-05-20 (×4): 12.5 mg via ORAL
  Filled 2015-05-19 (×7): qty 1

## 2015-05-19 MED ORDER — TORSEMIDE 20 MG PO TABS
40.0000 mg | ORAL_TABLET | Freq: Every day | ORAL | Status: DC
  Administered 2015-05-19: 40 mg via ORAL
  Filled 2015-05-19: qty 2

## 2015-05-19 MED ORDER — ISOSORB DINITRATE-HYDRALAZINE 20-37.5 MG PO TABS
1.5000 | ORAL_TABLET | Freq: Two times a day (BID) | ORAL | Status: DC
  Administered 2015-05-19 – 2015-05-23 (×9): 1.5 via ORAL
  Filled 2015-05-19 (×2): qty 1.5
  Filled 2015-05-19 (×8): qty 2
  Filled 2015-05-19: qty 1.5
  Filled 2015-05-19: qty 2

## 2015-05-19 MED ORDER — COLCHICINE 0.6 MG PO TABS
0.6000 mg | ORAL_TABLET | Freq: Every day | ORAL | Status: DC | PRN
  Filled 2015-05-19: qty 1

## 2015-05-19 MED ORDER — ASPIRIN 81 MG PO CHEW
81.0000 mg | CHEWABLE_TABLET | Freq: Every day | ORAL | Status: DC
  Administered 2015-05-19 – 2015-05-23 (×5): 81 mg via ORAL
  Filled 2015-05-19 (×5): qty 1

## 2015-05-19 MED ORDER — AZITHROMYCIN 250 MG PO TABS
500.0000 mg | ORAL_TABLET | Freq: Every day | ORAL | Status: AC
  Administered 2015-05-19: 500 mg via ORAL
  Filled 2015-05-19: qty 2

## 2015-05-19 MED ORDER — INFLUENZA VAC SPLIT QUAD 0.5 ML IM SUSY
0.5000 mL | PREFILLED_SYRINGE | INTRAMUSCULAR | Status: AC
  Administered 2015-05-20: 0.5 mL via INTRAMUSCULAR
  Filled 2015-05-19 (×3): qty 0.5

## 2015-05-19 MED ORDER — HYDRALAZINE HCL 20 MG/ML IJ SOLN
5.0000 mg | INTRAMUSCULAR | Status: DC | PRN
  Administered 2015-05-19 – 2015-05-20 (×5): 5 mg via INTRAVENOUS
  Filled 2015-05-19 (×5): qty 1

## 2015-05-19 MED ORDER — POTASSIUM CHLORIDE 20 MEQ/15ML (10%) PO SOLN
30.0000 meq | Freq: Once | ORAL | Status: AC
  Administered 2015-05-19: 30 meq via ORAL
  Filled 2015-05-19: qty 30

## 2015-05-19 MED ORDER — ALBUTEROL SULFATE (2.5 MG/3ML) 0.083% IN NEBU: 5.0000 mg | INHALATION_SOLUTION | RESPIRATORY_TRACT | Status: DC | PRN

## 2015-05-19 NOTE — Progress Notes (Signed)
[facility] PROGRESS NOTE   Phillip [name] [id number] DOB: March 13, 1961 DOA: 05/18/2015 PCP: [name], MD  HPI/Subjective: Feels much better, denies any fever or chills. Still has some SOB, cough and clear sputum production.  Assessment/Plan: Principal Problem:   Asthma exacerbation Active Problems:   OSA on CPAP   Hypokalemia   Acute renal failure superimposed on stage 3 chronic kidney disease (HCC)   Acute respiratory failure (HCC)   Hypertension   Gout   Chronic combined systolic and diastolic CHF (congestive heart failure) (HCC)   Diarrhea   Acute respiratory failure due to Asthma exacerbation:  Patient has cough, shortness of breath and wheezing, consistent with asthma exacerbation. Patient has trace leg edema, his volume status seems to fine, less likely to have CHF exacerbation. -will admit patient to telemetry bed (due to CHF) for observation -Nebulizers: scheduled Duoneb and prn albuterol -Solu-Medrol 60 mg IV bid -Oral azithromycin for 5 days.  -Mucinex for cough  -Urine S. pneumococcal antigen -Follow up blood culture x2, sputum culture, respiratory virus panel, Flu pcr  Chronic combined systolic and diastolic congestive heart failure:  2-D echo 02/21/15 showed EF 40-45% with grade 1 diastolic dysfunction.  Patient is on torsemide 40 mg daily at home. His BNP is slightly elevated at 429.7, but he has only trace amount of leg edema. -On aspirin, Coreg and bidil. -We will hold torsemide and start IV Lasix.  OSA - on CPAP  Hypokalemia: K= 3.2 on admission. - Repleted - Check Mg level  HTN: -On clonidine, torsemide and coreg -IV hydralazine when necessary  Gout: -continue home Colchicine prn  AoCKD-III: Baseline Cre is 1.5, his Cre 1.7, BUN 18 is on admission, slightly worsening. Likely due to prerenal secondary to dehydration and continuation of diruetics. - Check FeNa - Follow up renal function by BMP - Hold torsemide tonight, starting  tomorrow morning  Diarrhea: Patient had 3 bowel movements with loose stool today, currently no diarrhea or abdominal pain. -OBSERVE closely. Recheck C. difficile PCR if patient has diarrhea again (not ordered yet)  Code Status: Full Code Family Communication: Plan discussed with the patient. Disposition Plan: Remains inpatient Diet: Diet 2 gram sodium Room service appropriate?: Yes; Fluid consistency:: Thin  Consultants:  None  Procedures:  None  Antibiotics:  Azithromycin   Objective: Filed Vitals:   05/19/15 0636 05/19/15 0752  BP: 178/76 156/65  Pulse: 82 77  Temp: 98.9 F (37.2 C)   Resp: 18     Intake/Output Summary (Last 24 hours) at 05/19/15 1235 Last data filed at 05/19/15 0900  Gross per 24 hour  Intake    240 ml  Output   1075 ml  Net   -835 ml   Filed Weights   05/19/15 0204  Weight: 179.851 kg (396 lb 8 oz)    Exam: General: Alert and awake, oriented x3, not in any acute distress. HEENT: anicteric sclera, pupils reactive to light and accommodation, EOMI CVS: S1-S2 clear, no murmur rubs or gallops Chest: clear to auscultation bilaterally, no wheezing, rales or rhonchi Abdomen: soft nontender, nondistended, normal bowel sounds, no organomegaly Extremities: no cyanosis, clubbing or edema noted bilaterally Neuro: Cranial nerves II-XII intact, no focal neurological deficits  Data Reviewed: Basic Metabolic Panel:  Recent Labs Lab 05/18/15 2208 05/19/15 0223  NA 141  --   K 3.2*  --   CL 101  --   CO2 30  --   GLUCOSE 100*  --   BUN 18  --  CREATININE 1.70*  --   CALCIUM 9.1  --   MG  --  1.9   Liver Function Tests: No results for input(s): AST, ALT, ALKPHOS, BILITOT, PROT, ALBUMIN in the last 168 hours. No results for input(s): LIPASE, AMYLASE in the last 168 hours. No results for input(s): AMMONIA in the last 168 hours. CBC:  Recent Labs Lab 05/18/15 2208  WBC 4.4  NEUTROABS 2.3  HGB 12.7*  HCT 41.9  MCV 87.5  PLT 166    Cardiac Enzymes:  Recent Labs Lab 05/19/15 0222 05/19/15 0828  TROPONINI 0.04* 0.04*   BNP (last 3 results)  Recent Labs  02/20/15 1757 03/07/15 1806 05/18/15 2208  BNP 194.3* 282.4* 429.7*    ProBNP (last 3 results) No results for input(s): PROBNP in the last 8760 hours.  CBG: No results for input(s): GLUCAP in the last 168 hours.  Micro Recent Results (from the past 240 hour(s))  Culture, blood (routine x 2) Call MD if unable to obtain prior to antibiotics being given     Status: None (Preliminary result)   Collection Time: 05/19/15  1:33 AM  Result Value Ref Range Status   Specimen Description   Final    BLOOD RIGHT HAND Performed at [facility]    Special Requests BOTTLES DRAWN AEROBIC AND ANAEROBIC 5CC  Final   Culture PENDING  Incomplete   Report Status PENDING  Incomplete  Culture, blood (routine x 2) Call MD if unable to obtain prior to antibiotics being given     Status: None (Preliminary result)   Collection Time: 05/19/15  1:34 AM  Result Value Ref Range Status   Specimen Description   Final    BLOOD LEFT HAND Performed at [facility]    Special Requests BOTTLES DRAWN AEROBIC AND ANAEROBIC 5CC  Final   Culture PENDING  Incomplete   Report Status PENDING  Incomplete     Studies: Dg Chest 2 View  05/18/2015  CLINICAL DATA:  Chest tightness beginning last night. Cough earlier today. Shortness of breath. EXAM: CHEST  2 VIEW COMPARISON:  03/07/2015 FINDINGS: Mild cardiomegaly. No overt edema. No focal airspace opacities or effusions. No acute bony abnormality. IMPRESSION: Mild cardiomegaly.  No active disease. Electronically Signed   By: Rolm Baptise M.D.   On: 05/18/2015 18:57    Scheduled Meds: . aspirin  81 mg Oral Daily  . [START ON 05/20/2015] azithromycin  250 mg Oral Daily  . carvedilol  12.5 mg Oral BID WC  . cloNIDine  0.1 mg Oral Daily  . dextromethorphan-guaiFENesin  1 tablet Oral BID  . enoxaparin (LOVENOX) injection  40  mg Subcutaneous Q24H  . [START ON 05/20/2015] Influenza vac split quadrivalent PF  0.5 mL Intramuscular Tomorrow-1000  . ipratropium-albuterol  3 mL Nebulization QID  . isosorbide-hydrALAZINE  1.5 tablet Oral BID  . methylPREDNISolone (SOLU-MEDROL) injection  60 mg Intravenous Q12H  . torsemide  40 mg Oral Daily   Continuous Infusions:      Time spent: 35 minutes    [facility]  [facility] Pager [phone] If 7PM-7AM, please contact night-coverage at [email or web address], password [facility] 05/19/2015, 12:35 PM  LOS: 0 days

## 2015-05-19 NOTE — Progress Notes (Signed)
Utilization review completed.  

## 2015-05-20 DIAGNOSIS — I10 Essential (primary) hypertension: Secondary | ICD-10-CM

## 2015-05-20 LAB — BASIC METABOLIC PANEL
Anion gap: 9 (ref 5–15)
BUN: 19 mg/dL (ref 6–20)
CHLORIDE: 100 mmol/L — AB (ref 101–111)
CO2: 32 mmol/L (ref 22–32)
CREATININE: 1.39 mg/dL — AB (ref 0.61–1.24)
Calcium: 9 mg/dL (ref 8.9–10.3)
GFR calc Af Amer: >60 mL/min (ref >60)
GFR calc non Af Amer: 57 mL/min — ABNORMAL LOW (ref >60)
GLUCOSE: 133 mg/dL — AB (ref 65–99)
Potassium: 3.8 mmol/L (ref 3.5–5.1)
SODIUM: 141 mmol/L (ref 135–145)

## 2015-05-20 LAB — CBC
HCT: 42.3 % (ref 39.0–52.0)
Hemoglobin: 12.8 g/dL — ABNORMAL LOW (ref 13.0–17.0)
MCH: 26.4 pg (ref 26.0–34.0)
MCHC: 30.3 g/dL (ref 30.0–36.0)
MCV: 87.4 fL (ref 78.0–100.0)
PLATELETS: 176 10*3/uL (ref 150–400)
RBC: 4.84 MIL/uL (ref 4.22–5.81)
RDW: 13.6 % (ref 11.5–15.5)
WBC: 6.9 10*3/uL (ref 4.0–10.5)

## 2015-05-20 LAB — UREA NITROGEN, URINE: Urea Nitrogen, Ur: 364 mg/dL

## 2015-05-20 MED ORDER — POTASSIUM CHLORIDE CRYS ER 20 MEQ PO TBCR
40.0000 meq | EXTENDED_RELEASE_TABLET | Freq: Once | ORAL | Status: AC
  Administered 2015-05-20: 40 meq via ORAL
  Filled 2015-05-20: qty 2

## 2015-05-20 MED ORDER — FUROSEMIDE 10 MG/ML IJ SOLN
40.0000 mg | Freq: Two times a day (BID) | INTRAMUSCULAR | Status: DC
Start: 2015-05-20 — End: 2015-05-21
  Administered 2015-05-20 – 2015-05-21 (×2): 40 mg via INTRAVENOUS
  Filled 2015-05-20 (×3): qty 4

## 2015-05-20 MED ORDER — DOCUSATE SODIUM 100 MG PO CAPS
100.0000 mg | ORAL_CAPSULE | Freq: Two times a day (BID) | ORAL | Status: DC
  Administered 2015-05-20 – 2015-05-23 (×7): 100 mg via ORAL
  Filled 2015-05-20 (×7): qty 1

## 2015-05-20 MED ORDER — CARVEDILOL 25 MG PO TABS
25.0000 mg | ORAL_TABLET | Freq: Two times a day (BID) | ORAL | Status: DC
  Administered 2015-05-20 – 2015-05-23 (×6): 25 mg via ORAL
  Filled 2015-05-20 (×6): qty 1

## 2015-05-20 NOTE — Progress Notes (Signed)
Initial Nutrition Assessment  DOCUMENTATION CODES:   Morbid obesity  INTERVENTION:   RD to monitor for educational needs  NUTRITION DIAGNOSIS:   Unintentional weight loss related to chronic illness as evidenced by percent weight loss.  GOAL:   Patient will meet greater than or equal to 90% of their needs  MONITOR:   PO intake, Labs, Weight trends, I & O's  REASON FOR ASSESSMENT:   Consult Assessment of nutrition requirement/status  ASSESSMENT:   55 y.o. male with PMH of asthma, combined systolic and diastolic congestive heart failure, hypertension, hyperlipidemia, gout, OSA on CPAP, obesity, PE 2010, chronic kidney disease-stage III, who presents with cough and shortness breath.  Patient currently eating 100% of meals, on a 2g sodium diet. Pt reports recent weight loss of 11 lb. Per weight history, pt has lost 18 lb since 03/11/15 (4% wt loss x 2 months, insignificant for time frame). Weight loss could be from fluid loss, Lasix has been ordered for patient. Pt was admitted with at least 2 loose stools. Pt is being tested c.diff.  Will monitor for education needs.  Medications: Colace BID, Lasix injection BID Labs reviewed.  Diet Order:  Diet 2 gram sodium Room service appropriate?: Yes; Fluid consistency:: Thin  Skin:  Reviewed, no issues  Last BM:  2/24  Height:   Ht Readings from Last 1 Encounters:  05/19/15 5\' 5"  (1.651 m)    Weight:   Wt Readings from Last 1 Encounters:  05/20/15 392 lb 10.2 oz (178.1 kg)    Ideal Body Weight:  61.8 kg  BMI:  Body mass index is 65.34 kg/(m^2).  Estimated Nutritional Needs:   Kcal:  2000-2200  Protein:  80-90g  Fluid:  1.8-2L/day  EDUCATION NEEDS:   Education needs no appropriate at this time  Clayton Bibles, MS, RD, LDN Pager: [phone] After Hours Pager: [phone]

## 2015-05-20 NOTE — Progress Notes (Addendum)
[id number] pt had 5 minute run of SVT with HR 150-160. Pt asymptomatic. MD notified.  EKG done.    1010 run of SVT max HR 170.  MD notified.  Pt in the bathroom washing up at the time.  Asymptomatic.

## 2015-05-20 NOTE — Progress Notes (Signed)
[facility] PROGRESS NOTE   Phillip [name] [id number] DOB: [birth year] DOA: 05/18/2015 PCP: [name], MD  HPI/Subjective: Had an SVT while he was in the bathroom, back to normal. Twelve-lead EKG did not show any arrhythmias. He feels much better  Assessment/Plan: Principal Problem:   Asthma exacerbation Active Problems:   OSA on CPAP   Hypokalemia   Acute renal failure superimposed on stage 3 chronic kidney disease (HCC)   Acute respiratory failure (HCC)   Hypertension   Gout   Chronic combined systolic and diastolic CHF (congestive heart failure) (HCC)   Diarrhea   Acute respiratory failure due to Asthma exacerbation:  Patient has cough, shortness of breath and wheezing, consistent with asthma exacerbation. Patient has trace leg edema, his volume status seems to fine, less likely to have CHF exacerbation. -will admit patient to telemetry bed (due to CHF) for observation -Nebulizers: scheduled Duoneb and prn albuterol -Solu-Medrol 60 mg IV bid -Oral azithromycin for 5 days.  -Mucinex for cough  -Follow blood cultures, urine for pneumococcus antigen is negative. -Flow PCR is negative, respiratory virus panel is pending.  Chronic combined systolic and diastolic congestive heart failure:  2-D echo 02/21/15 showed EF 40-45% with grade 1 diastolic dysfunction.  Patient is on torsemide 40 mg daily at home. His BNP is slightly elevated at 429.7, but he has only trace amount of leg edema. -On aspirin, Coreg and bidil. -We will hold torsemide and start IV Lasix.  SVT -Had short run of SVT while he was in the bathroom trying to have a bowel movement. -Patient is on Coreg, I'll increase the dose to 25 mg twice a day and watch his heart rate.  OSA - on CPAP  Hypokalemia: K= 3.2 on admission. - Repleted - Check Mg level  HTN: -On clonidine, torsemide and coreg -IV hydralazine when necessary  Gout: -continue home Colchicine prn  AoCKD-III: Baseline Cre is  1.5, his Cre 1.7, BUN 18 is on admission, slightly worsening. Likely due to prerenal secondary to dehydration and continuation of diruetics. - Check FeNa - Follow up renal function by BMP - Hold torsemide tonight, starting tomorrow morning  Diarrhea: Patient had 3 bowel movements with loose stool today, currently no diarrhea or abdominal pain. -OBSERVE closely. Recheck C. difficile PCR if patient has diarrhea again (not ordered yet)  Code Status: Full Code Family Communication: Plan discussed with the patient. Disposition Plan: Remains inpatient Diet: Diet 2 gram sodium Room service appropriate?: Yes; Fluid consistency:: Thin  Consultants:  None  Procedures:  None  Antibiotics:  Azithromycin   Objective: Filed Vitals:   05/20/15 0549 05/20/15 0950  BP: 172/91 177/91  Pulse: 66   Temp: 98 F (36.7 C)   Resp: 20     Intake/Output Summary (Last 24 hours) at 05/20/15 1159 Last data filed at 05/20/15 1017  Gross per 24 hour  Intake   1080 ml  Output   3101 ml  Net  -2021 ml   Filed Weights   05/19/15 0204 05/20/15 0549  Weight: 179.851 kg (396 lb 8 oz) 178.1 kg (392 lb 10.2 oz)    Exam: General: Alert and awake, oriented x3, not in any acute distress. HEENT: anicteric sclera, pupils reactive to light and accommodation, EOMI CVS: S1-S2 clear, no murmur rubs or gallops Chest: clear to auscultation bilaterally, no wheezing, rales or rhonchi Abdomen: soft nontender, nondistended, normal bowel sounds, no organomegaly Extremities: no cyanosis, clubbing or edema noted bilaterally Neuro: Cranial nerves II-XII intact, no focal neurological  deficits  Data Reviewed: Basic Metabolic Panel:  Recent Labs Lab 05/18/15 2208 05/19/15 0223 05/20/15 0459  NA 141  --  141  K 3.2*  --  3.8  CL 101  --  100*  CO2 30  --  32  GLUCOSE 100*  --  133*  BUN 18  --  19  CREATININE 1.70*  --  1.39*  CALCIUM 9.1  --  9.0  MG  --  1.9  --    Liver Function Tests: No results  for input(s): AST, ALT, ALKPHOS, BILITOT, PROT, ALBUMIN in the last 168 hours. No results for input(s): LIPASE, AMYLASE in the last 168 hours. No results for input(s): AMMONIA in the last 168 hours. CBC:  Recent Labs Lab 05/18/15 2208 05/20/15 0459  WBC 4.4 6.9  NEUTROABS 2.3  --   HGB 12.7* 12.8*  HCT 41.9 42.3  MCV 87.5 87.4  PLT 166 176   Cardiac Enzymes:  Recent Labs Lab 05/19/15 0222 05/19/15 0828 05/19/15 1455  TROPONINI 0.04* 0.04* 0.03   BNP (last 3 results)  Recent Labs  02/20/15 1757 03/07/15 1806 05/18/15 2208  BNP 194.3* 282.4* 429.7*    ProBNP (last 3 results) No results for input(s): PROBNP in the last 8760 hours.  CBG: No results for input(s): GLUCAP in the last 168 hours.  Micro Recent Results (from the past 240 hour(s))  Culture, blood (routine x 2) Call MD if unable to obtain prior to antibiotics being given     Status: None (Preliminary result)   Collection Time: 05/19/15  1:33 AM  Result Value Ref Range Status   Specimen Description   Final    BLOOD RIGHT HAND Performed at [facility]    Special Requests BOTTLES DRAWN AEROBIC AND ANAEROBIC 5CC  Final   Culture PENDING  Incomplete   Report Status PENDING  Incomplete  Culture, blood (routine x 2) Call MD if unable to obtain prior to antibiotics being given     Status: None (Preliminary result)   Collection Time: 05/19/15  1:34 AM  Result Value Ref Range Status   Specimen Description   Final    BLOOD LEFT HAND Performed at [facility]    Special Requests BOTTLES DRAWN AEROBIC AND ANAEROBIC 5CC  Final   Culture PENDING  Incomplete   Report Status PENDING  Incomplete     Studies: Dg Chest 2 View  05/18/2015  CLINICAL DATA:  Chest tightness beginning last night. Cough earlier today. Shortness of breath. EXAM: CHEST  2 VIEW COMPARISON:  03/07/2015 FINDINGS: Mild cardiomegaly. No overt edema. No focal airspace opacities or effusions. No acute bony abnormality. IMPRESSION:  Mild cardiomegaly.  No active disease. Electronically Signed   By: Rolm Baptise M.D.   On: 05/18/2015 18:57    Scheduled Meds: . aspirin  81 mg Oral Daily  . azithromycin  250 mg Oral Daily  . carvedilol  12.5 mg Oral BID WC  . cloNIDine  0.1 mg Oral Daily  . dextromethorphan-guaiFENesin  1 tablet Oral BID  . docusate sodium  100 mg Oral BID  . enoxaparin (LOVENOX) injection  40 mg Subcutaneous Q24H  . Influenza vac split quadrivalent PF  0.5 mL Intramuscular Tomorrow-1000  . ipratropium-albuterol  3 mL Nebulization QID  . isosorbide-hydrALAZINE  1.5 tablet Oral BID  . methylPREDNISolone (SOLU-MEDROL) injection  60 mg Intravenous Q12H   Continuous Infusions:      Time spent: 35 minutes    [facility] Pager [phone]  If 7PM-7AM, please contact night-coverage at [email or web address], password [facility] 05/20/2015, 11:59 AM  LOS: 1 day

## 2015-05-21 LAB — BASIC METABOLIC PANEL
Anion gap: 9 (ref 5–15)
BUN: 28 mg/dL — ABNORMAL HIGH (ref 6–20)
CALCIUM: 9 mg/dL (ref 8.9–10.3)
CO2: 32 mmol/L (ref 22–32)
CREATININE: 1.76 mg/dL — AB (ref 0.61–1.24)
Chloride: 101 mmol/L (ref 101–111)
GFR calc Af Amer: 50 mL/min — ABNORMAL LOW (ref >60)
GFR calc non Af Amer: 43 mL/min — ABNORMAL LOW (ref >60)
GLUCOSE: 133 mg/dL — AB (ref 65–99)
Potassium: 3.9 mmol/L (ref 3.5–5.1)
Sodium: 142 mmol/L (ref 135–145)

## 2015-05-21 LAB — RESPIRATORY VIRUS PANEL
Adenovirus: NEGATIVE
INFLUENZA B 1: NEGATIVE
Influenza A: NEGATIVE
METAPNEUMOVIRUS: NEGATIVE
PARAINFLUENZA 1 A: NEGATIVE
PARAINFLUENZA 3 A: NEGATIVE
Parainfluenza 2: NEGATIVE
Respiratory Syncytial Virus A: POSITIVE — AB
Respiratory Syncytial Virus B: NEGATIVE
Rhinovirus: NEGATIVE

## 2015-05-21 MED ORDER — METHYLPREDNISOLONE SODIUM SUCC 125 MG IJ SOLR
60.0000 mg | Freq: Three times a day (TID) | INTRAMUSCULAR | Status: DC
Start: 2015-05-21 — End: 2015-05-22
  Administered 2015-05-21 – 2015-05-22 (×3): 60 mg via INTRAVENOUS
  Filled 2015-05-21 (×2): qty 2

## 2015-05-21 MED ORDER — ALBUTEROL SULFATE (2.5 MG/3ML) 0.083% IN NEBU: 2.5000 mg | INHALATION_SOLUTION | RESPIRATORY_TRACT | Status: DC | PRN

## 2015-05-21 MED ORDER — FUROSEMIDE 10 MG/ML IJ SOLN: 40.0000 mg | Freq: Every day | INTRAMUSCULAR | Status: DC

## 2015-05-21 MED ORDER — LEVOFLOXACIN IN D5W 750 MG/150ML IV SOLN
750.0000 mg | INTRAVENOUS | Status: DC
  Administered 2015-05-21: 750 mg via INTRAVENOUS
  Filled 2015-05-21: qty 150

## 2015-05-21 MED ORDER — FUROSEMIDE 10 MG/ML IJ SOLN: 40.0000 mg | Freq: Two times a day (BID) | INTRAMUSCULAR | Status: DC

## 2015-05-21 MED ORDER — FUROSEMIDE 10 MG/ML IJ SOLN
40.0000 mg | Freq: Two times a day (BID) | INTRAMUSCULAR | Status: DC
  Administered 2015-05-21 – 2015-05-22 (×2): 40 mg via INTRAVENOUS
  Filled 2015-05-21 (×2): qty 4

## 2015-05-21 NOTE — Progress Notes (Signed)
Spoke with pt regarding cpap.  Pt asked that I come back around midnight to assist with cpap.  RT will return at that time per pt request.

## 2015-05-21 NOTE — Progress Notes (Signed)
[facility] PROGRESS NOTE   Phillip [name] [id number] DOB: 09/13/1960 DOA: 05/18/2015 PCP: [name], MD  HPI/Subjective: He is still having tightness, SOB.  Coughing dark sputum.   Assessment/Plan: Principal Problem:   Asthma exacerbation Active Problems:   OSA on CPAP   Hypokalemia   Acute renal failure superimposed on stage 3 chronic kidney disease (HCC)   Acute respiratory failure (HCC)   Hypertension   Gout   Chronic combined systolic and diastolic CHF (congestive heart failure) (HCC)   Diarrhea   Acute respiratory failure due to Asthma exacerbation:  Patient has cough, shortness of breath and wheezing, consistent with asthma exacerbation. Patient has trace leg edema, his volume status seems to fine, less likely to have CHF exacerbation. -Nebulizers: scheduled Duoneb and prn albuterol -Solu-Medrol 60 mg IV  Change to TID  -Change Azithro to Levaquin to cover for PNA.  -Mucinex for cough  -Follow blood cultures, urine for pneumococcus antigen is negative. - PCR is negative, respiratory virus panel is pending.  Chronic combined systolic and diastolic congestive heart failure:  2-D echo 02/21/15 showed EF 40-45% with grade 1 diastolic dysfunction.  Patient is on torsemide 40 mg daily at home. His BNP is slightly elevated at 429.7, but he has only trace amount of leg edema. -On aspirin, Coreg and bidil. -We will hold torsemide and started  IV Lasix 2-26 -negative 3 L. Mildly increase Cr monitor on lasix.   SVT -Had short run of SVT while he was in the bathroom trying to have a bowel movement. -Patient is on Coreg, which was increased to  25 mg twice a day 2-26  OSA - on CPAP  Hypokalemia: resolved.   HTN: -On clonidine, torsemide and coreg -IV hydralazine when necessary  Gout: -continue home Colchicine prn  AoCKD-III: Baseline Cre is 1.5, his Cre 1.7, BUN 18 is on admission, slightly worsening. Likely due to prerenal secondary to dehydration and  continuation of diruetics.  -holding torsemide.  -monitor on lasix.   Diarrhea: Patient had 3 bowel movements with loose stool today, currently no diarrhea or abdominal pain. -OBSERVE closely. Recheck C. difficile PCR if patient has diarrhea again (not ordered yet)  Code Status: Full Code Family Communication: Plan discussed with the patient. Disposition Plan: Remains inpatient Diet: Diet 2 gram sodium Room service appropriate?: Yes; Fluid consistency:: Thin  Consultants:  None  Procedures:  None  Antibiotics:  Azithromycin discontinue 2-27  Levaquin 2-27    Objective: Filed Vitals:   05/20/15 2100 05/21/15 0609  BP: 159/90 134/50  Pulse: 71 64  Temp: 98 F (36.7 C) 97.6 F (36.4 C)  Resp: 20 18    Intake/Output Summary (Last 24 hours) at 05/21/15 1257 Last data filed at 05/21/15 0617  Gross per 24 hour  Intake    720 ml  Output   1150 ml  Net   -430 ml   Filed Weights   05/19/15 0204 05/20/15 0549 05/21/15 0609  Weight: 179.851 kg (396 lb 8 oz) 178.1 kg (392 lb 10.2 oz) 169.1 kg (372 lb 12.8 oz)    Exam: General: Alert and awake, oriented x3, not in any acute distress. HEENT: anicteric sclera, pupils reactive to light and accommodation, EOMI CVS: S1-S2 clear, no murmur rubs or gallops Chest: bilateral wheezing.  Abdomen: soft nontender, nondistended, normal bowel sounds, no organomegaly Extremities: no cyanosis, clubbing or edema noted bilaterally Neuro: Cranial nerves II-XII intact, no focal neurological deficits  Data Reviewed: Basic Metabolic Panel:  Recent Labs Lab 05/18/15  2208 05/19/15 0223 05/20/15 0459 05/21/15 0438  NA 141  --  141 142  K 3.2*  --  3.8 3.9  CL 101  --  100* 101  CO2 30  --  32 32  GLUCOSE 100*  --  133* 133*  BUN 18  --  19 28*  CREATININE 1.70*  --  1.39* 1.76*  CALCIUM 9.1  --  9.0 9.0  MG  --  1.9  --   --    Liver Function Tests: No results for input(s): AST, ALT, ALKPHOS, BILITOT, PROT, ALBUMIN in the last  168 hours. No results for input(s): LIPASE, AMYLASE in the last 168 hours. No results for input(s): AMMONIA in the last 168 hours. CBC:  Recent Labs Lab 05/18/15 2208 05/20/15 0459  WBC 4.4 6.9  NEUTROABS 2.3  --   HGB 12.7* 12.8*  HCT 41.9 42.3  MCV 87.5 87.4  PLT 166 176   Cardiac Enzymes:  Recent Labs Lab 05/19/15 0222 05/19/15 0828 05/19/15 1455  TROPONINI 0.04* 0.04* 0.03   BNP (last 3 results)  Recent Labs  02/20/15 1757 03/07/15 1806 05/18/15 2208  BNP 194.3* 282.4* 429.7*    ProBNP (last 3 results) No results for input(s): PROBNP in the last 8760 hours.  CBG: No results for input(s): GLUCAP in the last 168 hours.  Micro Recent Results (from the past 240 hour(s))  Culture, blood (routine x 2) Call MD if unable to obtain prior to antibiotics being given     Status: None (Preliminary result)   Collection Time: 05/19/15  1:33 AM  Result Value Ref Range Status   Specimen Description BLOOD RIGHT HAND  Final   Special Requests BOTTLES DRAWN AEROBIC AND ANAEROBIC 5CC  Final   Culture   Final    NO GROWTH 1 DAY Performed at [facility]    Report Status PENDING  Incomplete  Culture, blood (routine x 2) Call MD if unable to obtain prior to antibiotics being given     Status: None (Preliminary result)   Collection Time: 05/19/15  1:34 AM  Result Value Ref Range Status   Specimen Description BLOOD LEFT HAND  Final   Special Requests BOTTLES DRAWN AEROBIC AND ANAEROBIC 5CC  Final   Culture   Final    NO GROWTH 1 DAY Performed at [facility]    Report Status PENDING  Incomplete     Studies: No results found.  Scheduled Meds: . aspirin  81 mg Oral Daily  . azithromycin  250 mg Oral Daily  . carvedilol  25 mg Oral BID WC  . cloNIDine  0.1 mg Oral Daily  . dextromethorphan-guaiFENesin  1 tablet Oral BID  . docusate sodium  100 mg Oral BID  . enoxaparin (LOVENOX) injection  40 mg Subcutaneous Q24H  . furosemide  40 mg Intravenous BID    . ipratropium-albuterol  3 mL Nebulization QID  . isosorbide-hydrALAZINE  1.5 tablet Oral BID  . methylPREDNISolone (SOLU-MEDROL) injection  60 mg Intravenous Q12H   Continuous Infusions:      Time spent: 35 minutes    Yashica Sterbenz A  [facility] Pager [phone] If 7PM-7AM, please contact night-coverage at [email or web address], password [facility] 05/21/2015, 12:57 PM  LOS: 2 days

## 2015-05-21 NOTE — Progress Notes (Signed)
Pharmacy Antibiotic Note  Phillip [name] is a 55 y.o. male admitted on 05/18/2015 with COPD and now r/o  pneumonia.  Pharmacy has been consulted for Levaquin dosing.  Plan:  Levaquin 750mg  IV q24h  Follow up renal fxn, culture results, and clinical course.   Height: 5\' 5"  (165.1 cm) Weight: (!) 372 lb 12.8 oz (169.1 kg) IBW/kg (Calculated) : 61.5  Temp (24hrs), Avg:97.9 F (36.6 C), Min:97.6 F (36.4 C), Max:98.1 F (36.7 C)   Recent Labs Lab 05/18/15 2208 05/20/15 0459 05/21/15 0438  WBC 4.4 6.9  --   CREATININE 1.70* 1.39* 1.76*    Estimated Creatinine Clearance: 72.6 mL/min (by C-G formula based on Cr of 1.76).   CrCl ~ 50 ml/min (normalized)  No Known Allergies  Antimicrobials this admission: Azithromycin 2/25 >> 2/27 Levaquin 2/27 >>   Dose adjustments this admission:  Microbiology results: 2/25 BCx: ngtd 2/25 Influenza PCR: negative 2/25 Respiratory virus panel: pending 2/25 Strep pneumo Ur Ag: negative   Thank you for allowing pharmacy to be a part of this patient's care.  Gretta Arab PharmD, BCPS Pager [phone] 05/21/2015 1:45 PM

## 2015-05-22 DIAGNOSIS — J9601 Acute respiratory failure with hypoxia: Secondary | ICD-10-CM

## 2015-05-22 DIAGNOSIS — I509 Heart failure, unspecified: Secondary | ICD-10-CM | POA: Insufficient documentation

## 2015-05-22 LAB — BASIC METABOLIC PANEL
ANION GAP: 9 (ref 5–15)
BUN: 29 mg/dL — ABNORMAL HIGH (ref 6–20)
CALCIUM: 8.8 mg/dL — AB (ref 8.9–10.3)
CO2: 33 mmol/L — ABNORMAL HIGH (ref 22–32)
CREATININE: 1.69 mg/dL — AB (ref 0.61–1.24)
Chloride: 102 mmol/L (ref 101–111)
GFR, EST AFRICAN AMERICAN: 52 mL/min — AB (ref >60)
GFR, EST NON AFRICAN AMERICAN: 45 mL/min — AB (ref >60)
GLUCOSE: 136 mg/dL — AB (ref 65–99)
Potassium: 3.8 mmol/L (ref 3.5–5.1)
Sodium: 144 mmol/L (ref 135–145)

## 2015-05-22 LAB — EXPECTORATED SPUTUM ASSESSMENT W REFEX TO RESP CULTURE

## 2015-05-22 LAB — EXPECTORATED SPUTUM ASSESSMENT W GRAM STAIN, RFLX TO RESP C

## 2015-05-22 MED ORDER — LEVOFLOXACIN 750 MG PO TABS
750.0000 mg | ORAL_TABLET | Freq: Every day | ORAL | Status: DC
  Administered 2015-05-22 – 2015-05-23 (×2): 750 mg via ORAL
  Filled 2015-05-22 (×2): qty 1

## 2015-05-22 MED ORDER — FUROSEMIDE 10 MG/ML IJ SOLN
60.0000 mg | Freq: Two times a day (BID) | INTRAMUSCULAR | Status: DC
  Administered 2015-05-22 – 2015-05-23 (×2): 60 mg via INTRAVENOUS
  Filled 2015-05-22 (×2): qty 6

## 2015-05-22 MED ORDER — METHYLPREDNISOLONE SODIUM SUCC 40 MG IJ SOLR
40.0000 mg | Freq: Two times a day (BID) | INTRAMUSCULAR | Status: DC
  Administered 2015-05-22 – 2015-05-23 (×2): 40 mg via INTRAVENOUS
  Filled 2015-05-22 (×2): qty 1

## 2015-05-22 MED ORDER — LEVOFLOXACIN 500 MG PO TABS: 250.0000 mg | ORAL_TABLET | Freq: Every day | ORAL | Status: DC

## 2015-05-22 NOTE — Progress Notes (Signed)
Pharmacy Antibiotic Note  Phillip [name] is a 55 y.o. male admitted on 05/18/2015 with COPD, RSV positive, and r/o  pneumonia.  Pharmacy has been consulted for Levaquin dosing.  Plan:  Levaquin 750mg  PO q24h  Follow up renal fxn, culture results, and clinical course.   Height: 5\' 5"  (165.1 cm) Weight: (!) 398 lb 6.4 oz (180.713 kg) IBW/kg (Calculated) : 61.5  Temp (24hrs), Avg:97.9 F (36.6 C), Min:97.2 F (36.2 C), Max:98.4 F (36.9 C)   Recent Labs Lab 05/18/15 2208 05/20/15 0459 05/21/15 0438 05/22/15 0418  WBC 4.4 6.9  --   --   CREATININE 1.70* 1.39* 1.76* 1.69*    Estimated Creatinine Clearance: 79 mL/min (by C-G formula based on Cr of 1.69).   CrCl ~ 52 ml/min (normalized)  No Known Allergies  Antimicrobials this admission: Azithromycin 2/25 >> 2/27 Levaquin 2/27 >>   Dose adjustments this admission:  Microbiology results: 2/25 BCx: ngtd 2/25 Influenza PCR: negative 2/25 Respiratory virus panel: RSV A + 2/25 Strep pneumo Ur Ag: negative 2/28 Sputum: sent   Thank you for allowing pharmacy to be a part of this patient's care.  Gretta Arab PharmD, BCPS Pager [phone] 05/22/2015 9:37 AM

## 2015-05-22 NOTE — Progress Notes (Addendum)
[facility] PROGRESS NOTE   Phillip [name] [id number] DOB: 1960/07/02 DOA: 05/18/2015 PCP: [name], MD    Assessment/Plan: Principal Problem:   Asthma exacerbation Active Problems:   OSA on CPAP   Hypokalemia   Acute renal failure superimposed on stage 3 chronic kidney disease (HCC)   Acute respiratory failure (HCC)   Hypertension   Gout   Chronic combined systolic and diastolic CHF (congestive heart failure) (HCC)   Diarrhea   Acute on chronic congestive heart failure ([place])   Subjective-patient resting comfortably and having his breakfast, denies chest pain or shortness of breath  Assessment and plan  Acute respiratory failure due to Asthma exacerbation: RSV A   Symptoms improving, agree that symptoms are less likely due to   CHF exacerbation. -Nebulizers: scheduled Duoneb and prn albuterol -Solu-Medrol 60 mg IV  Change to TID , start tapering steroids Continue Levaquin 3 more days then discontinue -Mucinex for cough  -Follow blood cultures, urine for pneumococcus antigen is negative. - PCR is negative, respiratory virus panel positive  Chronic combined systolic and diastolic congestive heart failure:  2-D echo 02/21/15 showed EF 40-45% with grade 1 diastolic dysfunction.  Patient is on torsemide 40 mg daily at home. His BNP is slightly elevated at 429.7, but he has only trace amount of leg edema. -On aspirin, Coreg and bidil. Continue IV Lasix  Continue strict I's and O's  SVT -Had short run of SVT while he was in the bathroom trying to have a bowel movement. -Patient is on Coreg, continue 25 mg twice a day    OSA - on CPAP  Hypokalemia: resolved.   HTN: -On clonidine, torsemide and coreg -IV hydralazine when necessary  Gout: -continue home Colchicine prn  AoCKD-III: Baseline Cre is 1.5, his Cre 1.7, BUN 18 is on admission, slightly worsening. Likely due to prerenal secondary to dehydration and continuation of diruetics.  --monitor on  lasix.   Diarrhea: Patient had 3 bowel movements with loose stool today, currently no diarrhea or abdominal pain.    Code Status: Full Code Family Communication: Plan discussed with the patient. Disposition Plan: Anticipate discharge tomorrow if improving Diet: Diet 2 gram sodium Room service appropriate?: Yes; Fluid consistency:: Thin  Consultants:  None  Procedures:  None  Antibiotics:  Azithromycin discontinue 2-27  Levaquin 2-27    Objective: Filed Vitals:   05/22/15 0012 05/22/15 0517  BP:  157/72  Pulse: 78 70  Temp:  97.2 F (36.2 C)  Resp: 20 18    Intake/Output Summary (Last 24 hours) at 05/22/15 0911 Last data filed at 05/22/15 0900  Gross per 24 hour  Intake    840 ml  Output   1001 ml  Net   -161 ml   Filed Weights   05/20/15 0549 05/21/15 0609 05/22/15 0517  Weight: 178.1 kg (392 lb 10.2 oz) 169.1 kg (372 lb 12.8 oz) 180.713 kg (398 lb 6.4 oz)    Exam: General: Alert and awake, oriented x3, not in any acute distress. HEENT: anicteric sclera, pupils reactive to light and accommodation, EOMI CVS: S1-S2 clear, no murmur rubs or gallops Chest: bilateral wheezing.  Abdomen: soft nontender, nondistended, normal bowel sounds, no organomegaly Extremities: no cyanosis, clubbing or edema noted bilaterally Neuro: Cranial nerves II-XII intact, no focal neurological deficits  Data Reviewed: Basic Metabolic Panel:  Recent Labs Lab 05/18/15 2208 05/19/15 0223 05/20/15 0459 05/21/15 0438 05/22/15 0418  NA 141  --  141 142 144  K 3.2*  --  3.8 3.9 3.8  CL 101  --  100* 101 102  CO2 30  --  32 32 33*  GLUCOSE 100*  --  133* 133* 136*  BUN 18  --  19 28* 29*  CREATININE 1.70*  --  1.39* 1.76* 1.69*  CALCIUM 9.1  --  9.0 9.0 8.8*  MG  --  1.9  --   --   --    Liver Function Tests: No results for input(s): AST, ALT, ALKPHOS, BILITOT, PROT, ALBUMIN in the last 168 hours. No results for input(s): LIPASE, AMYLASE in the last 168 hours. No results  for input(s): AMMONIA in the last 168 hours. CBC:  Recent Labs Lab 05/18/15 2208 05/20/15 0459  WBC 4.4 6.9  NEUTROABS 2.3  --   HGB 12.7* 12.8*  HCT 41.9 42.3  MCV 87.5 87.4  PLT 166 176   Cardiac Enzymes:  Recent Labs Lab 05/19/15 0222 05/19/15 0828 05/19/15 1455  TROPONINI 0.04* 0.04* 0.03   BNP (last 3 results)  Recent Labs  02/20/15 1757 03/07/15 1806 05/18/15 2208  BNP 194.3* 282.4* 429.7*    ProBNP (last 3 results) No results for input(s): PROBNP in the last 8760 hours.  CBG: No results for input(s): GLUCAP in the last 168 hours.  Micro Recent Results (from the past 240 hour(s))  Culture, blood (routine x 2) Call MD if unable to obtain prior to antibiotics being given     Status: None (Preliminary result)   Collection Time: 05/19/15  1:33 AM  Result Value Ref Range Status   Specimen Description BLOOD RIGHT HAND  Final   Special Requests BOTTLES DRAWN AEROBIC AND ANAEROBIC 5CC  Final   Culture   Final    NO GROWTH 2 DAYS Performed at [facility]    Report Status PENDING  Incomplete  Culture, blood (routine x 2) Call MD if unable to obtain prior to antibiotics being given     Status: None (Preliminary result)   Collection Time: 05/19/15  1:34 AM  Result Value Ref Range Status   Specimen Description BLOOD LEFT HAND  Final   Special Requests BOTTLES DRAWN AEROBIC AND ANAEROBIC 5CC  Final   Culture   Final    NO GROWTH 2 DAYS Performed at [facility]    Report Status PENDING  Incomplete  Respiratory virus panel     Status: Abnormal   Collection Time: 05/19/15  2:32 AM  Result Value Ref Range Status   Respiratory Syncytial Virus A Positive (A) Negative Final   Respiratory Syncytial Virus B Negative Negative Final   Influenza A Negative Negative Final   Influenza B Negative Negative Final   Parainfluenza 1 Negative Negative Final   Parainfluenza 2 Negative Negative Final   Parainfluenza 3 Negative Negative Final   Metapneumovirus  Negative Negative Final   Rhinovirus Negative Negative Final   Adenovirus Negative Negative Final    Comment: (NOTE) Performed At: [facility] [address] [place], [place] [id number] Lindon Romp MD [id number]   Culture, sputum-assessment     Status: None   Collection Time: 05/22/15 12:06 AM  Result Value Ref Range Status   Specimen Description SPU  Final   Special Requests NONE  Final   Sputum evaluation   Final    THIS SPECIMEN IS ACCEPTABLE. RESPIRATORY CULTURE REPORT TO FOLLOW.   Report Status 05/22/2015 FINAL  Final     Studies: No results found.  Scheduled Meds: . aspirin  81 mg Oral Daily  .  carvedilol  25 mg Oral BID WC  . cloNIDine  0.1 mg Oral Daily  . dextromethorphan-guaiFENesin  1 tablet Oral BID  . docusate sodium  100 mg Oral BID  . enoxaparin (LOVENOX) injection  40 mg Subcutaneous Q24H  . furosemide  40 mg Intravenous BID  . ipratropium-albuterol  3 mL Nebulization QID  . isosorbide-hydrALAZINE  1.5 tablet Oral BID  . levofloxacin (LEVAQUIN) IV  750 mg Intravenous Q24H  . methylPREDNISolone (SOLU-MEDROL) injection  60 mg Intravenous 3 times per day   Continuous Infusions:      Time spent: 35 minutes    [facility]  [facility] Pager [phone] If 7PM-7AM, please contact night-coverage at [email or web address], password [facility] 05/22/2015, 9:11 AM  LOS: 3 days

## 2015-05-23 LAB — COMPREHENSIVE METABOLIC PANEL
ALBUMIN: 3.3 g/dL — AB (ref 3.5–5.0)
ALT: 34 U/L (ref 17–63)
AST: 21 U/L (ref 15–41)
Alkaline Phosphatase: 54 U/L (ref 38–126)
Anion gap: 7 (ref 5–15)
BILIRUBIN TOTAL: 0.6 mg/dL (ref 0.3–1.2)
BUN: 34 mg/dL — AB (ref 6–20)
CHLORIDE: 99 mmol/L — AB (ref 101–111)
CO2: 35 mmol/L — ABNORMAL HIGH (ref 22–32)
Calcium: 8.6 mg/dL — ABNORMAL LOW (ref 8.9–10.3)
Creatinine, Ser: 1.65 mg/dL — ABNORMAL HIGH (ref 0.61–1.24)
GFR, EST AFRICAN AMERICAN: 54 mL/min — AB (ref >60)
GFR, EST NON AFRICAN AMERICAN: 46 mL/min — AB (ref >60)
Glucose, Bld: 132 mg/dL — ABNORMAL HIGH (ref 65–99)
POTASSIUM: 3.4 mmol/L — AB (ref 3.5–5.1)
SODIUM: 141 mmol/L (ref 135–145)
TOTAL PROTEIN: 6 g/dL — AB (ref 6.5–8.1)

## 2015-05-23 LAB — CBC
HCT: 42.6 % (ref 39.0–52.0)
HEMOGLOBIN: 13.1 g/dL (ref 13.0–17.0)
MCH: 26.9 pg (ref 26.0–34.0)
MCHC: 30.8 g/dL (ref 30.0–36.0)
MCV: 87.5 fL (ref 78.0–100.0)
PLATELETS: 174 10*3/uL (ref 150–400)
RBC: 4.87 MIL/uL (ref 4.22–5.81)
RDW: 13.6 % (ref 11.5–15.5)
WBC: 8.6 10*3/uL (ref 4.0–10.5)

## 2015-05-23 MED ORDER — GUAIFENESIN ER 600 MG PO TB12: 1200.0000 mg | ORAL_TABLET | Freq: Two times a day (BID) | ORAL | Status: DC

## 2015-05-23 MED ORDER — POTASSIUM CHLORIDE CRYS ER 20 MEQ PO TBCR
40.0000 meq | EXTENDED_RELEASE_TABLET | Freq: Once | ORAL | Status: AC
  Administered 2015-05-23: 40 meq via ORAL
  Filled 2015-05-23: qty 2

## 2015-05-23 MED ORDER — PREDNISONE 20 MG PO TABS: 40.0000 mg | ORAL_TABLET | Freq: Every day | ORAL | Status: DC

## 2015-05-23 MED ORDER — DOCUSATE SODIUM 100 MG PO CAPS: 100.0000 mg | ORAL_CAPSULE | Freq: Two times a day (BID) | ORAL | Status: DC

## 2015-05-23 MED ORDER — PREDNISONE 10 MG PO TABS: ORAL_TABLET | ORAL | Status: DC

## 2015-05-23 MED ORDER — PANTOPRAZOLE SODIUM 40 MG PO TBEC: 40.0000 mg | DELAYED_RELEASE_TABLET | Freq: Every day | ORAL | Status: DC

## 2015-05-23 MED ORDER — PREDNISOLONE 5 MG PO TABS: 40.0000 mg | ORAL_TABLET | Freq: Every day | ORAL | Status: DC

## 2015-05-23 NOTE — Discharge Summary (Signed)
Phillip [name], is a 55 y.o. male  DOB 05/10/1960  MRN [id number].  Admission date:  05/18/2015  Admitting Physician  Ivor Costa, MD  Discharge Date:  05/23/2015   Primary MD  [name], MD  Recommendations for primary care physician for things to follow:  - Please check 2 view chest x-ray during next visit   Admission Diagnosis  Acute on chronic congestive heart failure, unspecified congestive heart failure type [facility]) [I50.9]   Discharge Diagnosis  Acute on chronic congestive heart failure, unspecified congestive heart failure type ([place]) [I50.9]    Principal Problem:   Asthma exacerbation Active Problems:   OSA on CPAP   Hypokalemia   Acute renal failure superimposed on stage 3 chronic kidney disease (HCC)   Acute respiratory failure (HCC)   Hypertension   Gout   Chronic combined systolic and diastolic CHF (congestive heart failure) (HCC)   Diarrhea   Acute on chronic congestive heart failure ([place])      Past Medical History  Diagnosis Date  . Hypertension   . Gout   . OSA on CPAP   . Morbid obesity ([place])   . Pulmonary embolism ([place])   . PNA (pneumonia)   . CHF (congestive heart failure) [facility])     Past Surgical History  Procedure Laterality Date  . Hernia repair         History of present illness and  Hospital Course:     Kindly see H&P for history of present illness and admission details, please review complete Labs, Consult reports and Test reports for all details in brief  HPI  from the history and physical done on the day of admission 05/18/2015 HPI: Phillip [name] is a 55 y.o. male with PMH of asthma, combined systolic and diastolic congestive heart failure, hypertension, hyperlipidemia, gout, OSA on CPAP, obesity, PE 2010, chronic kidney disease-stage III, who presents with cough and shortness breath.  Patient reports that he has cough and shortness of breath in the past 2  days. He coughs up yellow colored sputum, no fever or chills. He has some mild chest pain which is introduced by coughing only. At rest, he does not have chest pain. He also has runny nose, no sore throat. Because of severe cough, he vomited once. Patient reports that he had 3 bowel movements with loose stool this today. Currently no diarrhea, nausea, vomiting or abdominal pain. Patient does not have symptoms of UTI or unilateral weakness. He reports that his body weight has decreased from 403-->394 Lbs recently.  In ED, patient was found to have BNP 429.7, negative troponin, WBC 4.4, temperature 99.3, no tachycardia, potassium 3.2, slightly worsening renal function, negative chest x-ray for infiltration. Patient admitted to inpatient for further eval and treatment.   Hospital Course   Acute respiratory failure due to Asthma exacerbation: RSV A  - Hypoxic respiratory failure secondary to asthma exacerbation in the setting of acute viral infection RSV A, initially on IV steroids, tapered gradually, addition to by mouth prednisone on discharge, continue  with prednisone taper, to be given PPI prescription for GI prophylaxis. - Continue with Mucinex for cough - urine for pneumococcus antigen is negative. - Continue with when necessary albuterol inhaler  Chronic combined systolic and diastolic congestive heart failure:  - 2-D echo  02/21/15 showed EF 40-45% with grade 1 diastolic dysfunction.  - He  is on torsemide 40 mg daily at home. His BNP is slightly elevated at 429.7, but he has only trace amount of leg edema, treated with IV diuresis during hospital stay, resumed on home dose torsemide on discharge -On aspirin, Coreg and bidil.   SVT -Had short run of SVT while he was in the bathroom trying to have a bowel movement. -Patient is on Coreg, continue 25 mg twice a day, right monitored and repleted closely.  OSA - on CPAP  Hypokalemia - Repleted  HTN: -On clonidine, torsemide and  coreg  Gout: -Continue with home medication  CKD-III:  - Baseline  Cre is 1.6 his Cre 1.7, BUN 18 is on admission, crit 21.6 x 1 discharge  Diarrhea: Patient had 3 bowel movements with loose stool today, currently no diarrhea or abdominal pain.  Morbid obesity - Nutrition consult appreciated  Discharge Condition:  Stable   Follow UP  Follow-up Information    Follow up with [name], MD. Schedule an appointment as soon as possible for a visit in 1 week.   Specialty:  Internal Medicine   Why:  Keep your appointment on 05/28/2015   Contact information:   [place] [address] [phone]         Discharge Instructions  and  Discharge Medications         Discharge Instructions    Diet - low sodium heart healthy    Complete by:  As directed      Discharge instructions    Complete by:  As directed   Follow with Primary MD [name], MD in 7 days   Get CBC, CMP, 2 view Chest X ray checked  by Primary MD next visit.    Activity: As tolerated with Full fall precautions use walker/cane & assistance as needed   Disposition Home **   Diet: Heart Healthy ** , with feeding assistance and aspiration precautions.  For Heart failure patients - Check your Weight same time everyday, if you gain over 2 pounds, or you develop in leg swelling, experience more shortness of breath or chest pain, call your Primary MD immediately. Follow Cardiac Low Salt Diet and 1.5 lit/day fluid restriction.   On your next visit with your primary care physician please Get Medicines reviewed and adjusted.   Please request your Prim.MD to go over all Hospital Tests and Procedure/Radiological results at the follow up, please get all Hospital records sent to your Prim MD by signing hospital release before you go home.   If you experience worsening of your admission symptoms, develop shortness of breath, life threatening emergency, suicidal or homicidal thoughts you  must seek medical attention immediately by calling 911 or calling your MD immediately  if symptoms less severe.  You Must read complete instructions/literature along with all the possible adverse reactions/side effects for all the Medicines you take and that have been prescribed to you. Take any new Medicines after you have completely understood and accpet all the possible adverse reactions/side effects.   Do not drive, operating heavy machinery, perform activities at heights, swimming or participation in water activities or provide baby sitting services  if your were admitted for syncope or siezures until you have seen by Primary MD or a Neurologist and advised to do so again.  Do not drive when taking Pain medications.    Do not take more than prescribed Pain, Sleep and Anxiety Medications  Special Instructions: If you have smoked or chewed Tobacco  in the last 2 yrs please stop smoking, stop any regular Alcohol  and or any Recreational drug use.  Wear Seat belts while driving.   Please note  You were cared for by a hospitalist during your hospital stay. If you have any questions about your discharge medications or the care you received while you were in the hospital after you are discharged, you can call the unit and asked to speak with the hospitalist on call if the hospitalist that took care of you is not available. Once you are discharged, your primary care physician will handle any further medical issues. Please note that NO REFILLS for any discharge medications will be authorized once you are discharged, as it is imperative that you return to your primary care physician (or establish a relationship with a primary care physician if you do not have one) for your aftercare needs so that they can reassess your need for medications and monitor your lab values.     Increase activity slowly    Complete by:  As directed             Medication List    STOP taking these medications         furosemide 40 MG tablet  Commonly known as:  LASIX      TAKE these medications        albuterol 108 (90 Base) MCG/ACT inhaler  Commonly known as:  PROVENTIL HFA;VENTOLIN HFA  Inhale 2 puffs into the lungs every 6 (six) hours as needed for shortness of breath.     aspirin 81 MG chewable tablet  Chew 1 tablet (81 mg total) by mouth daily.     atorvastatin 40 MG tablet  Commonly known as:  LIPITOR  Take 1 tablet (40 mg total) by mouth daily at 6 PM.     carvedilol 12.5 MG tablet  Commonly known as:  COREG  Take 1 tablet (12.5 mg total) by mouth 2 (two) times daily with a meal.     cloNIDine 0.1 MG tablet  Commonly known as:  CATAPRES  Take 0.1 mg by mouth daily.     colchicine 0.6 MG tablet  Take 0.6 mg by mouth daily as needed. For gout     docusate sodium 100 MG capsule  Commonly known as:  COLACE  Take 1 capsule (100 mg total) by mouth 2 (two) times daily.     febuxostat 40 MG tablet  Commonly known as:  ULORIC  Take 1 tablet (40 mg total) by mouth daily.     guaifenesin 100 MG/5ML syrup  Commonly known as:  ROBITUSSIN  Take 200 mg by mouth 3 (three) times daily as needed for cough.     guaiFENesin 600 MG 12 hr tablet  Commonly known as:  MUCINEX  Take 2 tablets (1,200 mg total) by mouth 2 (two) times daily.     isosorbide-hydrALAZINE 20-37.5 MG tablet  Commonly known as:  BIDIL  Take 1.5 tablets by mouth 2 (two) times daily.     pantoprazole 40 MG tablet  Commonly known as:  PROTONIX  Take 1 tablet (40 mg total) by mouth daily.     predniSONE  10 MG tablet  Commonly known as:  DELTASONE  Please take 40 mg oral daily for 3 days, then 30 mg oral daily for 2 days, then 20 mg oral daily for 2 days, then 10 mg oral for 2 days, then stop     torsemide 20 MG tablet  Commonly known as:  DEMADEX  Take 40 mg by mouth daily.          Diet and Activity recommendation: See Discharge Instructions above   Consults obtained - none   Major procedures and  Radiology Reports - PLEASE review detailed and final reports for all details, in brief -      Dg Chest 2 View  05/18/2015  CLINICAL DATA:  Chest tightness beginning last night. Cough earlier today. Shortness of breath. EXAM: CHEST  2 VIEW COMPARISON:  03/07/2015 FINDINGS: Mild cardiomegaly. No overt edema. No focal airspace opacities or effusions. No acute bony abnormality. IMPRESSION: Mild cardiomegaly.  No active disease. Electronically Signed   By: Rolm Baptise M.D.   On: 05/18/2015 18:57    Micro Results     Recent Results (from the past 240 hour(s))  Culture, blood (routine x 2) Call MD if unable to obtain prior to antibiotics being given     Status: None (Preliminary result)   Collection Time: 05/19/15  1:33 AM  Result Value Ref Range Status   Specimen Description BLOOD RIGHT HAND  Final   Special Requests BOTTLES DRAWN AEROBIC AND ANAEROBIC 5CC  Final   Culture   Final    NO GROWTH 3 DAYS Performed at [facility]    Report Status PENDING  Incomplete  Culture, blood (routine x 2) Call MD if unable to obtain prior to antibiotics being given     Status: None (Preliminary result)   Collection Time: 05/19/15  1:34 AM  Result Value Ref Range Status   Specimen Description BLOOD LEFT HAND  Final   Special Requests BOTTLES DRAWN AEROBIC AND ANAEROBIC 5CC  Final   Culture   Final    NO GROWTH 3 DAYS Performed at [facility]    Report Status PENDING  Incomplete  Respiratory virus panel     Status: Abnormal   Collection Time: 05/19/15  2:32 AM  Result Value Ref Range Status   Respiratory Syncytial Virus A Positive (A) Negative Final   Respiratory Syncytial Virus B Negative Negative Final   Influenza A Negative Negative Final   Influenza B Negative Negative Final   Parainfluenza 1 Negative Negative Final   Parainfluenza 2 Negative Negative Final   Parainfluenza 3 Negative Negative Final   Metapneumovirus Negative Negative Final   Rhinovirus Negative Negative  Final   Adenovirus Negative Negative Final    Comment: (NOTE) Performed At: [facility] [address] [place], [place] [id number] Lindon Romp MD [id number]   Culture, sputum-assessment     Status: None   Collection Time: 05/22/15 12:06 AM  Result Value Ref Range Status   Specimen Description SPU  Final   Special Requests NONE  Final   Sputum evaluation   Final    THIS SPECIMEN IS ACCEPTABLE. RESPIRATORY CULTURE REPORT TO FOLLOW.   Report Status 05/22/2015 FINAL  Final  Culture, respiratory (NON-Expectorated)     Status: None (Preliminary result)   Collection Time: 05/22/15 12:06 AM  Result Value Ref Range Status   Specimen Description SPUTUM  Final   Special Requests NONE  Final   Gram Stain   Final    ABUNDANT  WBC PRESENT, PREDOMINANTLY PMN RARE SQUAMOUS EPITHELIAL CELLS PRESENT ABUNDANT GRAM POSITIVE COCCI IN PAIRS MODERATE GRAM POSITIVE RODS Performed at [facility]    Culture PENDING  Incomplete   Report Status PENDING  Incomplete       Today   Subjective:   Phillip [name] today has no headache,no chest or abdominal  pain,no new weakness tingling or numbness, feels much better wants to go home today.   Objective:   Blood pressure 159/74, pulse 60, temperature 97.8 F (36.6 C), temperature source Oral, resp. rate 16, height 5\' 5"  (1.651 m), weight 180.804 kg (398 lb 9.6 oz), SpO2 93 %.   Intake/Output Summary (Last 24 hours) at 05/23/15 1039 Last data filed at 05/23/15 0630  Gross per 24 hour  Intake   2090 ml  Output   1750 ml  Net    340 ml    Exam Awake Alert, Oriented x 3, No new F.N deficits, Normal affect [place].AT,PERRAL Supple Neck,No JVD, No cervical lymphadenopathy appriciated.  Symmetrical Chest wall movement, Good air movement bilaterally, CTAB, no active wheezing RRR,No Gallops,Rubs or new Murmurs, No Parasternal Heave +ve B.Sounds, Abd Soft, Non tender, No organomegaly appriciated, No rebound -guarding or rigidity. No  Cyanosis, Clubbing or edema, No new Rash or bruise  Data Review   CBC w Diff:  Lab Results  Component Value Date   WBC 8.6 05/23/2015   HGB 13.1 05/23/2015   HCT 42.6 05/23/2015   PLT 174 05/23/2015   LYMPHOPCT 34 05/18/2015   MONOPCT 12 05/18/2015   EOSPCT 2 05/18/2015   BASOPCT 1 05/18/2015    CMP:  Lab Results  Component Value Date   NA 141 05/23/2015   K 3.4* 05/23/2015   CL 99* 05/23/2015   CO2 35* 05/23/2015   BUN 34* 05/23/2015   CREATININE 1.65* 05/23/2015   PROT 6.0* 05/23/2015   ALBUMIN 3.3* 05/23/2015   BILITOT 0.6 05/23/2015   ALKPHOS 54 05/23/2015   AST 21 05/23/2015   ALT 34 05/23/2015  .   Total Time in preparing paper work, data evaluation and todays exam - 35 minutes  Coralie Stanke M.D on 05/23/2015 at 10:39 AM  [facility]   Office  [phone]

## 2015-05-23 NOTE — Progress Notes (Signed)
Nutrition Follow-up  DOCUMENTATION CODES:   Morbid obesity  INTERVENTION:  - Continue 2 gm Na diet - RD will continue to monitor for needs  NUTRITION DIAGNOSIS:   Unintentional weight loss related to chronic illness as evidenced by percent weight loss. - resolved, N/A  No new nutrition dx at this time.  GOAL:   Patient will meet greater than or equal to 90% of their needs -met with meals  MONITOR:   PO intake, Labs, Weight trends, I & O's  ASSESSMENT:   55 y.o. male with PMH of asthma, combined systolic and diastolic congestive heart failure, hypertension, hyperlipidemia, gout, OSA on CPAP, obesity, PE 2010, chronic kidney disease-stage III, who presents with cough and shortness breath.  3/1 Pt continues with 100% intakes since last assessment. Noted 6 lb weight gain since 2/26 which, similar to previous weight loss, may be fluid related. Pt is meeting needs. No muscle or fat wasting noted. Medications reviewed; 60 mg IV Lasix BID. Labs reviewed; K: 3.4 mmol/L, Cl: 99 mmol/L, BUN/creatinine elevated, Ca: 8.6 mg/dL, GFR: 46.    2/26 - Patient currently eating 100% of meals, on a 2g sodium diet.  - Pt reports recent weight loss of 11 lb.  - Per weight history, pt has lost 18 lb since 03/11/15 (4% wt loss x 2 months, insignificant for time frame).  - Weight loss could be from fluid loss, Lasix has been ordered for patient.  - Pt was admitted with at least 2 loose stools.  - Pt is being tested for c.diff.   Diet Order:  Diet 2 gram sodium Room service appropriate?: Yes; Fluid consistency:: Thin  Skin:  Reviewed, no issues  Last BM:  PTA  Height:   Ht Readings from Last 1 Encounters:  05/19/15 _0  (1.651 m)    Weight:   Wt Readings from Last 1 Encounters:  05/23/15 398 lb 9.6 oz (180.804 kg)    Ideal Body Weight:  61.8 kg  BMI:  Body mass index is 66.33 kg/(m^2).  Estimated Nutritional Needs:   Kcal:  2000-2200  Protein:  80-90g  Fluid:   1.8-2L/day  EDUCATION NEEDS:   Education needs no appropriate at this time     Jarome Matin, RD, LDN Inpatient Clinical Dietitian Pager # [phone] After hours/weekend pager # [phone]

## 2015-05-23 NOTE — Discharge Instructions (Signed)
Follow with Primary MD [name], MD in 7 days   Get CBC, CMP, 2 view Chest X ray checked  by Primary MD next visit.    Activity: As tolerated with Full fall precautions use walker/cane & assistance as needed   Disposition Home    Diet: Heart Healthy  , with feeding assistance and aspiration precautions.  For Heart failure patients - Check your Weight same time everyday, if you gain over 2 pounds, or you develop in leg swelling, experience more shortness of breath or chest pain, call your Primary MD immediately. Follow Cardiac Low Salt Diet and 1.5 lit/day fluid restriction.   On your next visit with your primary care physician please Get Medicines reviewed and adjusted.   Please request your Prim.MD to go over all Hospital Tests and Procedure/Radiological results at the follow up, please get all Hospital records sent to your Prim MD by signing hospital release before you go home.   If you experience worsening of your admission symptoms, develop shortness of breath, life threatening emergency, suicidal or homicidal thoughts you must seek medical attention immediately by calling 911 or calling your MD immediately  if symptoms less severe.  You Must read complete instructions/literature along with all the possible adverse reactions/side effects for all the Medicines you take and that have been prescribed to you. Take any new Medicines after you have completely understood and accpet all the possible adverse reactions/side effects.   Do not drive, operating heavy machinery, perform activities at heights, swimming or participation in water activities or provide baby sitting services if your were admitted for syncope or siezures until you have seen by Primary MD or a Neurologist and advised to do so again.  Do not drive when taking Pain medications.    Do not take more than prescribed Pain, Sleep and Anxiety Medications  Special Instructions: If you have smoked or chewed Tobacco  in  the last 2 yrs please stop smoking, stop any regular Alcohol  and or any Recreational drug use.  Wear Seat belts while driving.   Please note  You were cared for by a hospitalist during your hospital stay. If you have any questions about your discharge medications or the care you received while you were in the hospital after you are discharged, you can call the unit and asked to speak with the hospitalist on call if the hospitalist that took care of you is not available. Once you are discharged, your primary care physician will handle any further medical issues. Please note that NO REFILLS for any discharge medications will be authorized once you are discharged, as it is imperative that you return to your primary care physician (or establish a relationship with a primary care physician if you do not have one) for your aftercare needs so that they can reassess your need for medications and monitor your lab values.

## 2015-05-24 LAB — CULTURE, BLOOD (ROUTINE X 2)
CULTURE: NO GROWTH
Culture: NO GROWTH

## 2015-05-24 LAB — CULTURE, RESPIRATORY

## 2015-05-24 LAB — CULTURE, RESPIRATORY W GRAM STAIN: Culture: NORMAL

## 2015-06-20 ENCOUNTER — Emergency Department (HOSPITAL_COMMUNITY): Payer: Managed Care, Other (non HMO)

## 2015-06-20 ENCOUNTER — Encounter (HOSPITAL_COMMUNITY): Payer: Self-pay

## 2015-06-20 ENCOUNTER — Observation Stay (HOSPITAL_COMMUNITY)
Admission: EM | Admit: 2015-06-20 | Discharge: 2015-06-22 | Disposition: A | Discharge status: Home / Self Care | Payer: Managed Care, Other (non HMO) | Attending: Internal Medicine | Admitting: Internal Medicine

## 2015-06-20 DIAGNOSIS — I471 Supraventricular tachycardia, unspecified: Secondary | ICD-10-CM | POA: Diagnosis present

## 2015-06-20 DIAGNOSIS — Z9981 Dependence on supplemental oxygen: Secondary | ICD-10-CM | POA: Insufficient documentation

## 2015-06-20 DIAGNOSIS — E785 Hyperlipidemia, unspecified: Secondary | ICD-10-CM | POA: Diagnosis not present

## 2015-06-20 DIAGNOSIS — E876 Hypokalemia: Secondary | ICD-10-CM | POA: Diagnosis present

## 2015-06-20 DIAGNOSIS — K219 Gastro-esophageal reflux disease without esophagitis: Secondary | ICD-10-CM | POA: Diagnosis not present

## 2015-06-20 DIAGNOSIS — E662 Morbid (severe) obesity with alveolar hypoventilation: Secondary | ICD-10-CM | POA: Insufficient documentation

## 2015-06-20 DIAGNOSIS — M109 Gout, unspecified: Secondary | ICD-10-CM | POA: Diagnosis present

## 2015-06-20 DIAGNOSIS — I4581 Long QT syndrome: Secondary | ICD-10-CM | POA: Diagnosis not present

## 2015-06-20 DIAGNOSIS — Z6841 Body Mass Index (BMI) 40.0 and over, adult: Secondary | ICD-10-CM | POA: Diagnosis not present

## 2015-06-20 DIAGNOSIS — Z86718 Personal history of other venous thrombosis and embolism: Secondary | ICD-10-CM | POA: Diagnosis not present

## 2015-06-20 DIAGNOSIS — G4733 Obstructive sleep apnea (adult) (pediatric): Secondary | ICD-10-CM | POA: Diagnosis present

## 2015-06-20 DIAGNOSIS — Z7982 Long term (current) use of aspirin: Secondary | ICD-10-CM | POA: Insufficient documentation

## 2015-06-20 DIAGNOSIS — Z23 Encounter for immunization: Secondary | ICD-10-CM | POA: Insufficient documentation

## 2015-06-20 DIAGNOSIS — I10 Essential (primary) hypertension: Secondary | ICD-10-CM | POA: Diagnosis present

## 2015-06-20 DIAGNOSIS — I5042 Chronic combined systolic (congestive) and diastolic (congestive) heart failure: Secondary | ICD-10-CM | POA: Insufficient documentation

## 2015-06-20 DIAGNOSIS — I130 Hypertensive heart and chronic kidney disease with heart failure and stage 1 through stage 4 chronic kidney disease, or unspecified chronic kidney disease: Secondary | ICD-10-CM | POA: Diagnosis not present

## 2015-06-20 DIAGNOSIS — M10012 Idiopathic gout, left shoulder: Secondary | ICD-10-CM | POA: Diagnosis not present

## 2015-06-20 DIAGNOSIS — R002 Palpitations: Secondary | ICD-10-CM | POA: Insufficient documentation

## 2015-06-20 DIAGNOSIS — Z86711 Personal history of pulmonary embolism: Secondary | ICD-10-CM | POA: Insufficient documentation

## 2015-06-20 DIAGNOSIS — R0902 Hypoxemia: Secondary | ICD-10-CM

## 2015-06-20 DIAGNOSIS — J9621 Acute and chronic respiratory failure with hypoxia: Secondary | ICD-10-CM | POA: Diagnosis not present

## 2015-06-20 DIAGNOSIS — R0609 Other forms of dyspnea: Secondary | ICD-10-CM

## 2015-06-20 DIAGNOSIS — Z9989 Dependence on other enabling machines and devices: Secondary | ICD-10-CM

## 2015-06-20 DIAGNOSIS — R0789 Other chest pain: Secondary | ICD-10-CM | POA: Diagnosis present

## 2015-06-20 DIAGNOSIS — R0602 Shortness of breath: Secondary | ICD-10-CM | POA: Diagnosis not present

## 2015-06-20 DIAGNOSIS — R918 Other nonspecific abnormal finding of lung field: Secondary | ICD-10-CM | POA: Insufficient documentation

## 2015-06-20 DIAGNOSIS — N183 Chronic kidney disease, stage 3 (moderate): Secondary | ICD-10-CM | POA: Insufficient documentation

## 2015-06-20 LAB — BASIC METABOLIC PANEL
ANION GAP: 9 (ref 5–15)
BUN: 17 mg/dL (ref 6–20)
CHLORIDE: 102 mmol/L (ref 101–111)
CO2: 32 mmol/L (ref 22–32)
Calcium: 9.3 mg/dL (ref 8.9–10.3)
Creatinine, Ser: 1.81 mg/dL — ABNORMAL HIGH (ref 0.61–1.24)
GFR calc non Af Amer: 41 mL/min — ABNORMAL LOW (ref >60)
GFR, EST AFRICAN AMERICAN: 48 mL/min — AB (ref >60)
Glucose, Bld: 112 mg/dL — ABNORMAL HIGH (ref 65–99)
POTASSIUM: 3.4 mmol/L — AB (ref 3.5–5.1)
SODIUM: 143 mmol/L (ref 135–145)

## 2015-06-20 LAB — I-STAT TROPONIN, ED: TROPONIN I, POC: 0.03 ng/mL (ref 0.00–0.08)

## 2015-06-20 LAB — CBC
HCT: 37.3 % — ABNORMAL LOW (ref 39.0–52.0)
HEMOGLOBIN: 12 g/dL — AB (ref 13.0–17.0)
MCH: 27 pg (ref 26.0–34.0)
MCHC: 32.2 g/dL (ref 30.0–36.0)
MCV: 83.8 fL (ref 78.0–100.0)
Platelets: 208 10*3/uL (ref 150–400)
RBC: 4.45 MIL/uL (ref 4.22–5.81)
RDW: 14.1 % (ref 11.5–15.5)
WBC: 4.4 10*3/uL (ref 4.0–10.5)

## 2015-06-20 LAB — BRAIN NATRIURETIC PEPTIDE: B NATRIURETIC PEPTIDE 5: 394 pg/mL — AB (ref 0.0–100.0)

## 2015-06-20 MED ORDER — POTASSIUM CHLORIDE CRYS ER 20 MEQ PO TBCR
20.0000 meq | EXTENDED_RELEASE_TABLET | ORAL | Status: AC
  Administered 2015-06-20: 20 meq via ORAL
  Filled 2015-06-20: qty 1

## 2015-06-20 MED ORDER — IOPAMIDOL (ISOVUE-370) INJECTION 76%
100.0000 mL | Freq: Once | INTRAVENOUS | Status: AC | PRN
  Administered 2015-06-20: 100 mL via INTRAVENOUS

## 2015-06-20 MED ORDER — ONDANSETRON HCL 4 MG/2ML IJ SOLN
4.0000 mg | Freq: Once | INTRAMUSCULAR | Status: AC
  Administered 2015-06-20: 4 mg via INTRAVENOUS
  Filled 2015-06-20: qty 2

## 2015-06-20 MED ORDER — IPRATROPIUM-ALBUTEROL 0.5-2.5 (3) MG/3ML IN SOLN
3.0000 mL | RESPIRATORY_TRACT | Status: AC
  Administered 2015-06-20 (×3): 3 mL via RESPIRATORY_TRACT
  Filled 2015-06-20: qty 6
  Filled 2015-06-20: qty 3

## 2015-06-20 NOTE — ED Provider Notes (Signed)
CSN: [id number]     Arrival date & time 06/20/15  1656 History   First MD Initiated Contact with Patient 06/20/15 1816     Chief Complaint  Patient presents with  . Shortness of Breath  . Chest Pain     (Consider location/radiation/quality/duration/timing/severity/associated sxs/prior Treatment) Patient is a 54 y.o. male presenting with shortness of breath, chest pain, and general illness. The history is provided by the patient.  Shortness of Breath Associated symptoms: no abdominal pain, no chest pain, no fever, no headaches, no rash and no vomiting   Chest Pain Associated symptoms: no abdominal pain, no fever, no headache, no palpitations, no shortness of breath and not vomiting   Illness Severity:  Mild Onset quality:  Sudden Duration:  2 days Timing:  Constant Progression:  Worsening Chronicity:  New Associated symptoms: no abdominal pain, no chest pain, no congestion, no diarrhea, no fever, no headaches, no myalgias, no rash, no shortness of breath and no vomiting    55 yo M With a chief complaint shortness of breath. This been going on for the past couple days. He denies it feels like last time he had a PE. Patient also has had cough and congestion for the past couple days. Has a history of asthma and has been wheezing a bit as well. Doesn't think his heart failure as he had some loss of weight over the past couple months. Feel that his left lower extremity has been more swollen than the right recently. Patient is not on anticoagulation. Last PE was 6 years ago. 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  Past Medical History  Diagnosis Date  . Hypertension   . Gout   . OSA on CPAP   . Morbid obesity ([place])   . Pulmonary embolism ([place])   . PNA (pneumonia)   . CHF (congestive heart failure) [facility])    Past Surgical History  Procedure Laterality Date  . Hernia repair     Family  History  Problem Relation Age of Onset  . Diabetes Father   . CAD Father   . Hypertension Other   . Diabetes Other    Social History  Substance Use Topics  . Smoking status: Never Smoker   . Smokeless tobacco: None  . Alcohol Use: No    Review of Systems  Constitutional: Negative for fever and chills.  HENT: Negative for congestion and facial swelling.   Eyes: Negative for discharge and visual disturbance.  Respiratory: Negative for shortness of breath.   Cardiovascular: Negative for chest pain and palpitations.  Gastrointestinal: Negative for vomiting, abdominal pain and diarrhea.  Musculoskeletal: Negative for myalgias and arthralgias.  Skin: Negative for color change and rash.  Neurological: Negative for tremors, syncope and headaches.  Psychiatric/Behavioral: Negative for confusion and dysphoric mood.      Allergies  Review of patient's allergies indicates no known allergies.  Home Medications   Prior to Admission medications   Medication Sig Start Date End Date Taking? Authorizing Provider  albuterol (PROVENTIL HFA;VENTOLIN HFA) 108 (90 BASE) MCG/ACT inhaler Inhale 2 puffs into the lungs every 6 (six) hours as needed for shortness of breath. 05/09/14  Yes Modena Jansky, MD  aspirin 81 MG chewable tablet Chew 1 tablet (81 mg total) by mouth daily. 02/22/15  Yes Florencia Reasons, MD  carvedilol (COREG) 12.5 MG tablet Take 1 tablet (12.5 mg total) by mouth 2 (two)  times daily with a meal. 03/11/15  Yes Barton Dubois, MD  cloNIDine (CATAPRES) 0.1 MG tablet Take 0.1 mg by mouth daily.   Yes Historical Provider, MD  guaifenesin (ROBITUSSIN) 100 MG/5ML syrup Take 200 mg by mouth 3 (three) times daily as needed for cough.   Yes Historical Provider, MD  guaiFENesin-dextromethorphan (ROBITUSSIN DM) 100-10 MG/5ML syrup Take 5 mLs by mouth every 4 (four) hours as needed for cough.   Yes Historical Provider, MD  hydrALAZINE (APRESOLINE) 50 MG tablet Take 50 mg by mouth 2 (two) times daily.    Yes Historical Provider, MD  isosorbide mononitrate (IMDUR) 30 MG 24 hr tablet Take 30 mg by mouth daily.   Yes Historical Provider, MD  montelukast (SINGULAIR) 10 MG tablet Take 10 mg by mouth at bedtime.   Yes Historical Provider, MD  pantoprazole (PROTONIX) 40 MG tablet Take 1 tablet (40 mg total) by mouth daily. 05/23/15  Yes Albertine Patricia, MD  pseudoephedrine-dextromethorphan-guaifenesin (ROBITUSSIN-PE) 30-10-100 MG/5ML solution Take 10 mLs by mouth 4 (four) times daily as needed for cough.   Yes Historical Provider, MD  torsemide (DEMADEX) 20 MG tablet Take 40 mg by mouth daily.   Yes Historical Provider, MD  atorvastatin (LIPITOR) 40 MG tablet Take 1 tablet (40 mg total) by mouth daily at 6 PM. Patient not taking: Reported on 05/18/2015 02/22/15   Florencia Reasons, MD  colchicine 0.6 MG tablet Take 0.6 mg by mouth daily as needed. For gout    Historical Provider, MD  docusate sodium (COLACE) 100 MG capsule Take 1 capsule (100 mg total) by mouth 2 (two) times daily. Patient not taking: Reported on 06/20/2015 05/23/15   Silver Huguenin Elgergawy, MD  febuxostat (ULORIC) 40 MG tablet Take 1 tablet (40 mg total) by mouth daily. Patient not taking: Reported on 05/18/2015 02/22/15   Florencia Reasons, MD  guaiFENesin (MUCINEX) 600 MG 12 hr tablet Take 2 tablets (1,200 mg total) by mouth 2 (two) times daily. Patient not taking: Reported on 06/20/2015 05/23/15   Silver Huguenin Elgergawy, MD  isosorbide-hydrALAZINE (BIDIL) 20-37.5 MG tablet Take 1.5 tablets by mouth 2 (two) times daily. Patient not taking: Reported on 06/20/2015 03/11/15   Barton Dubois, MD  predniSONE (DELTASONE) 10 MG tablet Please take 40 mg oral daily for 3 days, then 30 mg oral daily for 2 days, then 20 mg oral daily for 2 days, then 10 mg oral for 2 days, then stop Patient not taking: Reported on 06/20/2015 05/23/15   Silver Huguenin Elgergawy, MD   BP 178/98 mmHg  Pulse 79  Temp(Src) 98.3 F (36.8 C) (Oral)  Resp 15  SpO2 83% Physical Exam  Constitutional: He is  oriented to person, place, and time. He appears well-developed and well-nourished.  HENT:  Head: Normocephalic and atraumatic.  Eyes: EOM are normal. Pupils are equal, round, and reactive to light.  Neck: Normal range of motion. Neck supple. No JVD present.  Cardiovascular: Normal rate and regular rhythm.  Exam reveals no gallop and no friction rub.   No murmur heard. Pulmonary/Chest: No respiratory distress. He has no wheezes.  Abdominal: He exhibits no distension. There is no rebound and no guarding.  Musculoskeletal: Normal range of motion.  Neurological: He is alert and oriented to person, place, and time.  Skin: No rash noted. No pallor.  Psychiatric: He has a normal mood and affect. His behavior is normal.  Nursing note and vitals reviewed.   ED Course  Procedures (including critical care time) Labs Review Labs Reviewed  BASIC METABOLIC PANEL - Abnormal; Notable for the following:    Potassium 3.4 (*)    Glucose, Bld 112 (*)    Creatinine, Ser 1.81 (*)    GFR calc non Af Amer 41 (*)    GFR calc Af Amer 48 (*)    All other components within normal limits  CBC - Abnormal; Notable for the following:    Hemoglobin 12.0 (*)    HCT 37.3 (*)    All other components within normal limits  BRAIN NATRIURETIC PEPTIDE - Abnormal; Notable for the following:    B Natriuretic Peptide 394.0 (*)    All other components within normal limits  I-STAT TROPOININ, ED    Imaging Review Dg Chest 2 View  06/20/2015  CLINICAL DATA:  Chest pain, left side back pain, shortness of breath starting today EXAM: CHEST  2 VIEW COMPARISON:  05/18/2015 FINDINGS: Cardiomediastinal silhouette is stable. No acute infiltrate or pleural effusion. No pulmonary edema. Mild elevation of the right hemidiaphragm again noted. IMPRESSION: No active cardiopulmonary disease. Electronically Signed   By: Lahoma Crocker M.D.   On: 06/20/2015 18:06   Ct Angio Chest Pe W/cm &/or Wo Cm  06/20/2015  CLINICAL DATA:  Acute onset of  shortness of breath while doing laundry today. Personal history of pulmonary emboli. EXAM: CT ANGIOGRAPHY CHEST WITH CONTRAST TECHNIQUE: Multidetector CT imaging of the chest was performed using the standard protocol during bolus administration of intravenous contrast. Multiplanar CT image reconstructions and MIPs were obtained to evaluate the vascular anatomy. CONTRAST:  100 cc Isovue 370 COMPARISON:  Radiography same day.  CT 04/26/2014 FINDINGS: The lungs are clear. Pulmonary arterial opacification is moderate. No pulmonary emboli. The aorta appears normal. No visible coronary artery calcification. No pleural or pericardial fluid. No acute bone finding. Review of the MIP images confirms the above findings. IMPRESSION: Negative exam. Limited detail because of patient's size. The lungs are clear. No pulmonary emboli seen. Electronically Signed   By: Nelson Chimes M.D.   On: 06/20/2015 21:25   I have personally reviewed and evaluated these images and lab results as part of my medical decision-making.   EKG Interpretation   Date/Time:  Wednesday June 20 2015 17:40:33 EDT Ventricular Rate:  68 PR Interval:  147 QRS Duration: 89 QT Interval:  426 QTC Calculation: 453 R Axis:   -3 Text Interpretation:  Sinus rhythm Low voltage, precordial leads  Borderline T abnormalities, lateral leads No significant change since last  tracing Confirmed by Marchelle Rinella MD, Quillian Quince [id number]) on 06/20/2015 6:17:12 PM      MDM   Final diagnoses:  SOB (shortness of breath) on exertion    55 yo M with a chief complaint of shortness of breath. Patient has multiple other etiologies that could cause the shortness of breath. He feels like this is likely last PE. Will obtain a CT angiography chest.  CT scan of the chest was negative for PE. Patient was ambulated and O2 sat dropped into the low 80s. Unsure the exact etiology of this hypoxia. Will admit.  The patients results and plan were reviewed and discussed.   Any x-rays  performed were independently reviewed by myself.   Differential diagnosis were considered with the presenting HPI.  Medications  ipratropium-albuterol (DUONEB) 0.5-2.5 (3) MG/3ML nebulizer solution 3 mL (3 mLs Nebulization Given 06/20/15 1916)  ondansetron (ZOFRAN) injection 4 mg (4 mg Intravenous Given 06/20/15 1916)  iopamidol (ISOVUE-370) 76 % injection 100 mL (100 mLs Intravenous Contrast Given 06/20/15 2111)  potassium  chloride SA (K-DUR,KLOR-CON) CR tablet 20 mEq (20 mEq Oral Given 06/20/15 2248)    Filed Vitals:   06/20/15 1731 06/20/15 2146  BP: 154/75 178/98  Pulse: 74 79  Temp: 98.3 F (36.8 C)   TempSrc: Oral   Resp: 18 15  SpO2: 94% 83%    Final diagnoses:  SOB (shortness of breath) on exertion    Admission/ observation were discussed with the admitting physician, patient and/or family and they are comfortable with the plan.    Deno Etienne, DO 06/20/15 2315

## 2015-06-20 NOTE — ED Notes (Signed)
Patient reports that he was doing laundry and became SOB which normally does not occur. Patient c/o pain left upper leg, right upper arm and left chest pain. 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

## 2015-06-20 NOTE — H&P (Signed)
[facility] History and Physical  Phillip [name] [id number] DOB: 1960-07-07 DOA: 06/20/2015  Referring physician: Dr. Tyrone Nine PCP: [name], MD   Chief Complaint: Shortness of breath  HPI:  Phillip [name] is a 55 year old male with a past medical history significant for obesity, HTN, combined systolic and diastolic CHF last EF [id number] in 01/2015, CKD stage III, OSA on CPAP, HLD, and gout; who presents with complaints of acutely worsening shortness of breath. Patient notes that he's been dealing with symptoms of shortness of breath for at least 2 years now. However this morning while trying to do laundry he reported being to winded to walk from one side of the room to the other. Tried using his albuterol inhaler with some mild relief of symptoms. Patient also notes that he has home oxygen with the concentrator and what sounds like portable oxygen tanks, but he notes that these are very heavy and only last 2 hours. He works in a dump truck and notes that it's impractical for him to be able to get  in and out the truck with these type of containers   Reporting associated symptoms of intermittent tightness feeling on the left side of his chest and a in his back. Rates pain as a 2 out of 10 on the pain scale. Symptoms while last a few seconds and self resolved without him needed to do anything. He reports the symptoms can happen at rest and with activity. Since January he reports his weight has come down from 429 pounds to 388 pounds. He weighs himself at least 3 times a week and denies any recent significant weight gain. He has changed his diet and only eats what can fit in the palm of his hand per meal. He had  scheduled and  appointment with pulmonology in the a.m.  Patient notes that he was referred to Dr. Sung Amabile previously in the past, but was told that his heart failure was not severe enough. He reports that he was never set up with a another cardiologist thereafter. 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   Review of Systems  Constitutional: Positive for weight loss. Negative for fever, chills and diaphoresis.  HENT: Negative for hearing loss.   Eyes: Negative for photophobia and pain.  Respiratory: Positive for shortness of breath and wheezing. Negative for hemoptysis.   Cardiovascular: Positive for leg swelling. Negative for chest pain.       Positive for chest tightness intermittently  Gastrointestinal: Negative for nausea, vomiting, abdominal pain and blood in stool.  Genitourinary: Negative for urgency and frequency.  Musculoskeletal: Positive for joint pain. Negative for back pain and falls.  Skin: Negative for itching.  Neurological: Negative for speech change, focal weakness and headaches.  Endo/Heme/Allergies: Negative for environmental allergies. Does not bruise/bleed easily.  Psychiatric/Behavioral: Negative for hallucinations and substance abuse.     Past Medical History  Diagnosis Date  . Hypertension   . Gout   . OSA on CPAP   . Morbid obesity ([place])   . Pulmonary embolism ([place])   . PNA (pneumonia)   . CHF (congestive heart failure) [facility])      Past Surgical History  Procedure Laterality Date  . Hernia repair        Social History:  reports that he has never smoked. He does not have any smokeless tobacco history on file. He reports that he does not  drink alcohol or use illicit drugs. Where does patient live--home   Can patient participate in ADLs?Yes  No Known Allergies  Family History  Problem Relation Age of Onset  . Diabetes Father   . CAD Father   . Hypertension Other   . Diabetes Other        Prior to Admission medications   Medication Sig Start Date End Date Taking? Authorizing Provider  albuterol (PROVENTIL HFA;VENTOLIN HFA) 108 (90 BASE) MCG/ACT inhaler Inhale 2 puffs into the lungs every 6 (six) hours as needed for shortness of  breath. 05/09/14  Yes Modena Jansky, MD  aspirin 81 MG chewable tablet Chew 1 tablet (81 mg total) by mouth daily. 02/22/15  Yes Florencia Reasons, MD  carvedilol (COREG) 12.5 MG tablet Take 1 tablet (12.5 mg total) by mouth 2 (two) times daily with a meal. 03/11/15  Yes Barton Dubois, MD  cloNIDine (CATAPRES) 0.1 MG tablet Take 0.1 mg by mouth daily.   Yes Historical Provider, MD  guaifenesin (ROBITUSSIN) 100 MG/5ML syrup Take 200 mg by mouth 3 (three) times daily as needed for cough.   Yes Historical Provider, MD  guaiFENesin-dextromethorphan (ROBITUSSIN DM) 100-10 MG/5ML syrup Take 5 mLs by mouth every 4 (four) hours as needed for cough.   Yes Historical Provider, MD  hydrALAZINE (APRESOLINE) 50 MG tablet Take 50 mg by mouth 2 (two) times daily.   Yes Historical Provider, MD  isosorbide mononitrate (IMDUR) 30 MG 24 hr tablet Take 30 mg by mouth daily.   Yes Historical Provider, MD  montelukast (SINGULAIR) 10 MG tablet Take 10 mg by mouth at bedtime.   Yes Historical Provider, MD  pantoprazole (PROTONIX) 40 MG tablet Take 1 tablet (40 mg total) by mouth daily. 05/23/15  Yes Albertine Patricia, MD  pseudoephedrine-dextromethorphan-guaifenesin (ROBITUSSIN-PE) 30-10-100 MG/5ML solution Take 10 mLs by mouth 4 (four) times daily as needed for cough.   Yes Historical Provider, MD  torsemide (DEMADEX) 20 MG tablet Take 40 mg by mouth daily.   Yes Historical Provider, MD  atorvastatin (LIPITOR) 40 MG tablet Take 1 tablet (40 mg total) by mouth daily at 6 PM. Patient not taking: Reported on 05/18/2015 02/22/15   Florencia Reasons, MD  colchicine 0.6 MG tablet Take 0.6 mg by mouth daily as needed. For gout    Historical Provider, MD  docusate sodium (COLACE) 100 MG capsule Take 1 capsule (100 mg total) by mouth 2 (two) times daily. Patient not taking: Reported on 06/20/2015 05/23/15   Silver Huguenin Elgergawy, MD  febuxostat (ULORIC) 40 MG tablet Take 1 tablet (40 mg total) by mouth daily. Patient not taking: Reported on 05/18/2015  02/22/15   Florencia Reasons, MD  guaiFENesin (MUCINEX) 600 MG 12 hr tablet Take 2 tablets (1,200 mg total) by mouth 2 (two) times daily. Patient not taking: Reported on 06/20/2015 05/23/15   Silver Huguenin Elgergawy, MD  isosorbide-hydrALAZINE (BIDIL) 20-37.5 MG tablet Take 1.5 tablets by mouth 2 (two) times daily. Patient not taking: Reported on 06/20/2015 03/11/15   Barton Dubois, MD  predniSONE (DELTASONE) 10 MG tablet Please take 40 mg oral daily for 3 days, then 30 mg oral daily for 2 days, then 20 mg oral daily for 2 days, then 10 mg oral for 2 days, then stop Patient not taking: Reported on 06/20/2015 05/23/15   Albertine Patricia, MD     Physical Exam: Filed Vitals:   06/20/15 1731 06/20/15 2146  BP: 154/75 178/98  Pulse: 74 79  Temp: 98.3 F (36.8 C)  TempSrc: Oral   Resp: 18 15  SpO2: 94% 83%     Constitutional: Vital signs reviewed. Patient is morbidly obese, but appears to be in no acute distress able to talk in full complete sentences. Head: Normocephalic and atraumatic  Ear: TM normal bilaterally  Mouth: no erythema or exudates, MMM  Eyes: PERRL, EOMI, conjunctivae normal, No scleral icterus.  Neck: Supple, Trachea midline normal ROM, No JVD, mass, thyromegaly, or carotid bruit present.  Cardiovascular: RRR, S1 normal, S2 normal, no MRG, pulses symmetric and intact bilaterally  Pulmonary/Chest: decreased overall aeration heard bilaterally. No signs of active wheezing present  Abdominal: Soft. Non-tender, non-distended, bowel sounds are normal, no masses, organomegaly, or guarding present.  GU: no CVA tenderness Musculoskeletal: No joint deformities, erythema, or stiffness, ROM full and no nontender Ext: +1 pitting edema bilaterally worse on the left leg. No cyanosis, pulses palpable bilaterally (DP and PT)  Hematology: no cervical, inginal, or axillary adenopathy.  Neurological: A&O x3, Strenght is normal and symmetric bilaterally, cranial nerve II-XII are grossly intact, no focal motor  deficit, sensory intact to light touch bilaterally.  Skin: Warm, dry and intact. No rash, cyanosis, or clubbing.  Psychiatric: Normal mood and affect. speech and behavior is normal. Judgment and thought content normal. Cognition and memory are normal.      Data Review   Micro Results No results found for this or any previous visit (from the past 240 hour(s)).  Radiology Reports Dg Chest 2 View  06/20/2015  CLINICAL DATA:  Chest pain, left side back pain, shortness of breath starting today EXAM: CHEST  2 VIEW COMPARISON:  05/18/2015 FINDINGS: Cardiomediastinal silhouette is stable. No acute infiltrate or pleural effusion. No pulmonary edema. Mild elevation of the right hemidiaphragm again noted. IMPRESSION: No active cardiopulmonary disease. Electronically Signed   By: Lahoma Crocker M.D.   On: 06/20/2015 18:06   Ct Angio Chest Pe W/cm &/or Wo Cm  06/20/2015  CLINICAL DATA:  Acute onset of shortness of breath while doing laundry today. Personal history of pulmonary emboli. EXAM: CT ANGIOGRAPHY CHEST WITH CONTRAST TECHNIQUE: Multidetector CT imaging of the chest was performed using the standard protocol during bolus administration of intravenous contrast. Multiplanar CT image reconstructions and MIPs were obtained to evaluate the vascular anatomy. CONTRAST:  100 cc Isovue 370 COMPARISON:  Radiography same day.  CT 04/26/2014 FINDINGS: The lungs are clear. Pulmonary arterial opacification is moderate. No pulmonary emboli. The aorta appears normal. No visible coronary artery calcification. No pleural or pericardial fluid. No acute bone finding. Review of the MIP images confirms the above findings. IMPRESSION: Negative exam. Limited detail because of patient's size. The lungs are clear. No pulmonary emboli seen. Electronically Signed   By: Nelson Chimes M.D.   On: 06/20/2015 21:25     CBC  Recent Labs Lab 06/20/15 1825  WBC 4.4  HGB 12.0*  HCT 37.3*  PLT 208  MCV 83.8  MCH 27.0  MCHC 32.2  RDW  14.1    Chemistries   Recent Labs Lab 06/20/15 1825  NA 143  K 3.4*  CL 102  CO2 32  GLUCOSE 112*  BUN 17  CREATININE 1.81*  CALCIUM 9.3   ------------------------------------------------------------------------------------------------------------------ CrCl cannot be calculated (Unknown ideal weight.). ------------------------------------------------------------------------------------------------------------------ No results for input(s): HGBA1C in the last 72 hours. ------------------------------------------------------------------------------------------------------------------ No results for input(s): CHOL, HDL, LDLCALC, TRIG, CHOLHDL, LDLDIRECT in the last 72 hours. ------------------------------------------------------------------------------------------------------------------ No results for input(s): TSH, T4TOTAL, T3FREE, THYROIDAB in the last 72 hours.  Invalid input(s): FREET3 ------------------------------------------------------------------------------------------------------------------  No results for input(s): VITAMINB12, FOLATE, FERRITIN, TIBC, IRON, RETICCTPCT in the last 72 hours.  Coagulation profile No results for input(s): INR, PROTIME in the last 168 hours.  No results for input(s): DDIMER in the last 72 hours.  Cardiac Enzymes No results for input(s): CKMB, TROPONINI, MYOGLOBIN in the last 168 hours.  Invalid input(s): CK ------------------------------------------------------------------------------------------------------------------ Invalid input(s): POCBNP   CBG: No results for input(s): GLUCAP in the last 168 hours.     EKG: Independently reviewed. sinus rhythm with low voltage in the precordial leads   Assessment/Plan  SOB (shortness of breath on exertion with hypoxia:  Acute on chronic.  patient found to have O2 desaturations as low as 83% with ambulation. This does not appear to be new as patient has been previously set up with home  oxygen.  - Admit to a telemetry bed - continuous pulse oximetry with nasal cannula oxygen to keep O2 sats greater than 92% - Consult care management to investigate patient's concerns with oxygen  - Will need to reschedule pulmonary consultation the patient had 4 today   Chest tightness: Acute. Patient's symptoms could be secondary to muscle spasms as seen to have mild hypokalemia on admission. Will ensure that there is no underlying cardiac cause the patient's symptoms.  - Trend cardiac troponins  -  Patient may benefit from a stress test  - Consult cardiology for further recommendations in a.m.  Combined systolic and diastolic congestive heart failure: 2-D echo 02/21/15 showed EF 40-45% with grade 1 diastolic dysfunction. Patient is on torsemide 40 mg daily at home. His BNP is slightly elevated at 394,  but he has only trace amount of leg edema, clinically does not seem to have CHF exacerbation. Also his renal function is slightly worsening, will not escalate diuretics. -will continue home dose torsemide, starting tomorrow -on aspirin, Coreg and bidil  Hypokalemia: Potassium was 3.4 on admission. - Given 20 mEq of potassium chloride  Esential hypertension: Appears uncontrolled. Question small cuff possibly elevating blood pressure. -On clonidine, torsemide, and coreg -IV hydralazine when necessary  CKD-III: Baseline creatinine is 1.6, but it appears acutely elevated to Cre 1.81 - Continue to monitor  OSA on CPAP - Respiratory to supply CPAP  Gerd -Continue Protonix   Gout: stable. No active signs of acute gout flare  -continue home Colchicine prn  DVT ppx: Lovenox  Code Status:   full Family Communication: bedside Disposition Plan: admit   Total time spent 55 minutes.Greater than 50% of this time was spent in counseling, explanation of diagnosis, planning of further management, and coordination of care  [facility] Pager [phone]  If 7PM-7AM,  please contact night-coverage [email or web address] Password [facility] 06/20/2015, 10:33 PM

## 2015-06-20 NOTE — ED Notes (Signed)
Pt's O2 stayed around 83! During walk.  Perfect waveform and pt in no distress whatsoever

## 2015-06-20 NOTE — ED Notes (Signed)
Pt dipped to 88% during ambulation

## 2015-06-20 NOTE — ED Notes (Signed)
MD at bedside. Dr. Smith

## 2015-06-21 ENCOUNTER — Observation Stay (HOSPITAL_BASED_OUTPATIENT_CLINIC_OR_DEPARTMENT_OTHER): Payer: Managed Care, Other (non HMO)

## 2015-06-21 ENCOUNTER — Encounter (HOSPITAL_COMMUNITY): Payer: Self-pay | Admitting: Physician Assistant

## 2015-06-21 ENCOUNTER — Institutional Professional Consult (permissible substitution): Payer: Self-pay | Admitting: Internal Medicine

## 2015-06-21 DIAGNOSIS — R002 Palpitations: Secondary | ICD-10-CM

## 2015-06-21 DIAGNOSIS — G4733 Obstructive sleep apnea (adult) (pediatric): Secondary | ICD-10-CM | POA: Diagnosis not present

## 2015-06-21 DIAGNOSIS — E876 Hypokalemia: Secondary | ICD-10-CM | POA: Diagnosis not present

## 2015-06-21 DIAGNOSIS — I471 Supraventricular tachycardia: Secondary | ICD-10-CM | POA: Diagnosis not present

## 2015-06-21 DIAGNOSIS — R0789 Other chest pain: Secondary | ICD-10-CM | POA: Diagnosis present

## 2015-06-21 DIAGNOSIS — J9621 Acute and chronic respiratory failure with hypoxia: Secondary | ICD-10-CM | POA: Diagnosis not present

## 2015-06-21 DIAGNOSIS — I10 Essential (primary) hypertension: Secondary | ICD-10-CM | POA: Diagnosis not present

## 2015-06-21 LAB — BASIC METABOLIC PANEL
Anion gap: 9 (ref 5–15)
BUN: 18 mg/dL (ref 6–20)
CHLORIDE: 102 mmol/L (ref 101–111)
CO2: 35 mmol/L — ABNORMAL HIGH (ref 22–32)
CREATININE: 1.72 mg/dL — AB (ref 0.61–1.24)
Calcium: 9.1 mg/dL (ref 8.9–10.3)
GFR calc Af Amer: 51 mL/min — ABNORMAL LOW (ref >60)
GFR calc non Af Amer: 44 mL/min — ABNORMAL LOW (ref >60)
GLUCOSE: 96 mg/dL (ref 65–99)
POTASSIUM: 3.4 mmol/L — AB (ref 3.5–5.1)
SODIUM: 146 mmol/L — AB (ref 135–145)

## 2015-06-21 LAB — CBC
HCT: 36.9 % — ABNORMAL LOW (ref 39.0–52.0)
HEMOGLOBIN: 11.6 g/dL — AB (ref 13.0–17.0)
MCH: 27.1 pg (ref 26.0–34.0)
MCHC: 31.4 g/dL (ref 30.0–36.0)
MCV: 86.2 fL (ref 78.0–100.0)
PLATELETS: 206 10*3/uL (ref 150–400)
RBC: 4.28 MIL/uL (ref 4.22–5.81)
RDW: 14.5 % (ref 11.5–15.5)
WBC: 4.2 10*3/uL (ref 4.0–10.5)

## 2015-06-21 LAB — TROPONIN I
TROPONIN I: 0.05 ng/mL — AB (ref <0.031)
Troponin I: 0.04 ng/mL — ABNORMAL HIGH (ref <0.031)
Troponin I: 0.04 ng/mL — ABNORMAL HIGH (ref <0.031)

## 2015-06-21 LAB — ECHOCARDIOGRAM COMPLETE
Height: 69.5 in
WEIGHTICAEL: 6248 [oz_av]

## 2015-06-21 LAB — MAGNESIUM: MAGNESIUM: 2.1 mg/dL (ref 1.7–2.4)

## 2015-06-21 MED ORDER — HYDRALAZINE HCL 20 MG/ML IJ SOLN
20.0000 mg | Freq: Once | INTRAMUSCULAR | Status: AC
Start: 2015-06-21 — End: 2015-06-21
  Administered 2015-06-21: 20 mg via INTRAVENOUS
  Filled 2015-06-21: qty 1

## 2015-06-21 MED ORDER — ISOSORBIDE MONONITRATE ER 30 MG PO TB24
30.0000 mg | ORAL_TABLET | Freq: Every day | ORAL | Status: DC
  Filled 2015-06-21: qty 1

## 2015-06-21 MED ORDER — IPRATROPIUM BROMIDE 0.02 % IN SOLN: 0.5000 mg | RESPIRATORY_TRACT | Status: DC | PRN

## 2015-06-21 MED ORDER — POTASSIUM CHLORIDE CRYS ER 20 MEQ PO TBCR
40.0000 meq | EXTENDED_RELEASE_TABLET | Freq: Two times a day (BID) | ORAL | Status: AC
  Administered 2015-06-21 (×2): 40 meq via ORAL
  Filled 2015-06-21 (×2): qty 2

## 2015-06-21 MED ORDER — COLCHICINE 0.6 MG PO TABS
0.6000 mg | ORAL_TABLET | Freq: Every day | ORAL | Status: DC | PRN
  Filled 2015-06-21: qty 1

## 2015-06-21 MED ORDER — ISOSORB DINITRATE-HYDRALAZINE 20-37.5 MG PO TABS
1.5000 | ORAL_TABLET | Freq: Two times a day (BID) | ORAL | Status: DC
  Administered 2015-06-21 – 2015-06-22 (×3): 1.5 via ORAL
  Filled 2015-06-21 (×4): qty 1.5

## 2015-06-21 MED ORDER — PNEUMOCOCCAL VAC POLYVALENT 25 MCG/0.5ML IJ INJ
0.5000 mL | INJECTION | INTRAMUSCULAR | Status: AC
  Administered 2015-06-22: 0.5 mL via INTRAMUSCULAR
  Filled 2015-06-21 (×3): qty 0.5

## 2015-06-21 MED ORDER — ACETAMINOPHEN 325 MG PO TABS
650.0000 mg | ORAL_TABLET | Freq: Four times a day (QID) | ORAL | Status: DC | PRN
Start: 2015-06-21 — End: 2015-06-22

## 2015-06-21 MED ORDER — ENOXAPARIN SODIUM 80 MG/0.8ML ~~LOC~~ SOLN
80.0000 mg | Freq: Every day | SUBCUTANEOUS | Status: DC
  Administered 2015-06-21 (×2): 80 mg via SUBCUTANEOUS
  Filled 2015-06-21 (×3): qty 0.8

## 2015-06-21 MED ORDER — HYDRALAZINE HCL 20 MG/ML IJ SOLN
5.0000 mg | INTRAMUSCULAR | Status: DC | PRN
  Administered 2015-06-21: 5 mg via INTRAVENOUS
  Filled 2015-06-21: qty 1

## 2015-06-21 MED ORDER — ACETAMINOPHEN 650 MG RE SUPP
650.0000 mg | Freq: Four times a day (QID) | RECTAL | Status: DC | PRN
Start: 2015-06-21 — End: 2015-06-22

## 2015-06-21 MED ORDER — ONDANSETRON HCL 4 MG/2ML IJ SOLN: 4.0000 mg | Freq: Four times a day (QID) | INTRAMUSCULAR | Status: DC | PRN

## 2015-06-21 MED ORDER — PANTOPRAZOLE SODIUM 40 MG PO TBEC
40.0000 mg | DELAYED_RELEASE_TABLET | Freq: Every day | ORAL | Status: DC
  Administered 2015-06-21 – 2015-06-22 (×2): 40 mg via ORAL
  Filled 2015-06-21 (×2): qty 1

## 2015-06-21 MED ORDER — HYDRALAZINE HCL 50 MG PO TABS
50.0000 mg | ORAL_TABLET | Freq: Two times a day (BID) | ORAL | Status: DC
  Administered 2015-06-21: 50 mg via ORAL
  Filled 2015-06-21 (×2): qty 1

## 2015-06-21 MED ORDER — CLONIDINE HCL 0.1 MG PO TABS
0.1000 mg | ORAL_TABLET | Freq: Every day | ORAL | Status: DC
  Administered 2015-06-21 – 2015-06-22 (×2): 0.1 mg via ORAL
  Filled 2015-06-21 (×2): qty 1

## 2015-06-21 MED ORDER — ALBUTEROL SULFATE (2.5 MG/3ML) 0.083% IN NEBU: 2.5000 mg | INHALATION_SOLUTION | RESPIRATORY_TRACT | Status: DC | PRN

## 2015-06-21 MED ORDER — MONTELUKAST SODIUM 10 MG PO TABS
10.0000 mg | ORAL_TABLET | Freq: Every day | ORAL | Status: DC
  Administered 2015-06-21 (×2): 10 mg via ORAL
  Filled 2015-06-21 (×3): qty 1

## 2015-06-21 MED ORDER — ASPIRIN 81 MG PO CHEW
81.0000 mg | CHEWABLE_TABLET | Freq: Every day | ORAL | Status: DC
  Administered 2015-06-21 – 2015-06-22 (×2): 81 mg via ORAL
  Filled 2015-06-21 (×2): qty 1

## 2015-06-21 MED ORDER — TORSEMIDE 20 MG PO TABS
40.0000 mg | ORAL_TABLET | Freq: Every day | ORAL | Status: DC
  Administered 2015-06-21 – 2015-06-22 (×2): 40 mg via ORAL
  Filled 2015-06-21 (×2): qty 2

## 2015-06-21 MED ORDER — CARVEDILOL 12.5 MG PO TABS
12.5000 mg | ORAL_TABLET | Freq: Two times a day (BID) | ORAL | Status: DC
  Administered 2015-06-21 – 2015-06-22 (×3): 12.5 mg via ORAL
  Filled 2015-06-21 (×3): qty 1

## 2015-06-21 MED ORDER — POLYETHYLENE GLYCOL 3350 17 G PO PACK
17.0000 g | PACK | Freq: Every day | ORAL | Status: DC
  Administered 2015-06-21 – 2015-06-22 (×2): 17 g via ORAL
  Filled 2015-06-21 (×2): qty 1

## 2015-06-21 MED ORDER — ONDANSETRON HCL 4 MG PO TABS: 4.0000 mg | ORAL_TABLET | Freq: Four times a day (QID) | ORAL | Status: DC | PRN

## 2015-06-21 NOTE — Progress Notes (Signed)
  Echocardiogram 2D Echocardiogram has been performed.  Phillip [name] 06/21/2015, 4:23 PM

## 2015-06-21 NOTE — Consult Note (Addendum)
CARDIOLOGY CONSULT NOTE   Patient ID: Phillip [name] MRN: [id number] DOB/AGE: 12-17-1960 55 y.o.  Admit date: 06/20/2015  Primary Physician   [name], MD Primary Cardiologist   New, was Dr Phillip [name] Reason for Consultation   SVT  [id number] Phillip [name] is a 55 y.o. year old male with a history of PE 2010 2nd inactivity, DVT, OSA not on CPAP, HTN, morbid obesity, CKD III, gout.   Admitted 01/2015 w/ SOB, LE edema. EF 40-45% by echo, no WMA, grade 1 dd.   He has been losing weight deliberately, has gone from 429 lbs down to 389. Has chronic DOE that was getting better with the weight loss.   Pt doing laundry 03/29 and had sudden onset of fatigue and DOE. He did not get relief with an inhaler. The SOB reminded him of his PE sx so he came in. While on telemetry, he had palpitations and had SVT on monitor so cards asked to see.   Mr Phillip [name] rarely gets palpitations. The last episode was about 2 years ago. The episodes have not lasted very long, and there are no associated symptoms.   Mr Phillip [name] had some atypical chest pain. He would get sharp pains at several spots on his left chest, back and arm. They would last 1-2 seconds and were sharp. He is not concerned about the pain, but is concerned that the DOE is an anginal equivalent. He admits that the DOE was improving with the weight loss, until yesterday.     Past Medical History  Diagnosis Date  . Hypertension   . Gout   . OSA on CPAP   . Morbid obesity ([place])   . Pulmonary embolism ([place])   . PNA (pneumonia)   . CHF (congestive heart failure) [facility])      Past Surgical History  Procedure Laterality Date  . Hernia repair      No Known Allergies  I have reviewed the patient's current medications . aspirin  81 mg Oral Daily  . carvedilol  12.5 mg Oral BID WC  . cloNIDine  0.1 mg Oral Daily  . enoxaparin (LOVENOX) injection  80 mg Subcutaneous QHS  . isosorbide-hydrALAZINE  1.5 tablet Oral BID  . montelukast  10 mg Oral QHS  .  pantoprazole  40 mg Oral Daily  . [START ON 06/22/2015] pneumococcal 23 valent vaccine  0.5 mL Intramuscular Tomorrow-1000  . potassium chloride  40 mEq Oral BID  . torsemide  40 mg Oral Daily     acetaminophen **OR** acetaminophen, albuterol, colchicine, ipratropium, ondansetron **OR** ondansetron (ZOFRAN) IV  Medication Sig  albuterol (PROVENTIL HFA;VENTOLIN HFA) 108 (90 BASE) MCG/ACT inhaler Inhale 2 puffs into the lungs every 6 (six) hours as needed for shortness of breath.  aspirin 81 MG chewable tablet Chew 1 tablet (81 mg total) by mouth daily.  carvedilol (COREG) 12.5 MG tablet Take 1 tablet (12.5 mg total) by mouth 2 (two) times daily with a meal.  cloNIDine (CATAPRES) 0.1 MG tablet Take 0.1 mg by mouth daily.  guaifenesin (ROBITUSSIN) 100 MG/5ML syrup Take 200 mg by mouth 3 (three) times daily as needed for cough.  guaiFENesin-dextromethorphan (ROBITUSSIN DM) 100-10 MG/5ML syrup Take 5 mLs by mouth every 4 (four) hours as needed for cough.  hydrALAZINE (APRESOLINE) 50 MG tablet Take 50 mg by mouth 2 (two) times daily.  isosorbide mononitrate (IMDUR) 30 MG 24 hr tablet Take 30 mg by mouth daily.  montelukast (SINGULAIR) 10 MG tablet Take 10 mg  by mouth at bedtime.  pantoprazole (PROTONIX) 40 MG tablet Take 1 tablet (40 mg total) by mouth daily.  pseudoephedrine-dextromethorphan-guaifenesin (ROBITUSSIN-PE) 30-10-100 MG/5ML solution Take 10 mLs by mouth 4 (four) times daily as needed for cough.  torsemide (DEMADEX) 20 MG tablet Take 40 mg by mouth daily.  atorvastatin (LIPITOR) 40 MG tablet Take 1 tablet (40 mg total) by mouth daily at 6 PM. Patient not taking: Reported on 05/18/2015  colchicine 0.6 MG tablet Take 0.6 mg by mouth daily as needed. For gout  docusate sodium (COLACE) 100 MG capsule Take 1 capsule (100 mg total) by mouth 2 (two) times daily. Patient not taking: Reported on 06/20/2015  febuxostat (ULORIC) 40 MG tablet Take 1 tablet (40 mg total) by mouth daily. Patient not  taking: Reported on 05/18/2015  guaiFENesin (MUCINEX) 600 MG 12 hr tablet Take 2 tablets (1,200 mg total) by mouth 2 (two) times daily. Patient not taking: Reported on 06/20/2015  isosorbide-hydrALAZINE (BIDIL) 20-37.5 MG tablet Take 1.5 tablets by mouth 2 (two) times daily. Patient not taking: Reported on 06/20/2015  predniSONE (DELTASONE) 10 MG tablet Please take 40 mg oral daily for 3 days, then 30 mg oral daily for 2 days, then 20 mg oral daily for 2 days, then 10 mg oral for 2 days, then stop Patient not taking: Reported on 06/20/2015     Social History   Social History  . Marital Status: Divorced    Spouse Name: N/A  . Number of Children: N/A  . Years of Education: N/A   Occupational History  . disabled, drove a dump truck    Social History Main Topics  . Smoking status: Never Smoker   . Smokeless tobacco: Not on file  . Alcohol Use: No  . Drug Use: No  . Sexual Activity: Not on file   Other Topics Concern  . Not on file   Social History Narrative   Pt lives in [place], alone.    Family Status  Relation Status Death Age  . Mother Alive     No cardiac issues  . Father Alive     heart transplant after MI's and CABG.   Family History  Problem Relation Age of Onset  . Diabetes Father   . CAD Father   . Hypertension Other   . Diabetes Other      ROS:  Full 14 point review of systems complete and found to be negative unless listed above.  Physical Exam: Blood pressure 163/92, pulse 124, temperature 98.1 F (36.7 C), temperature source Axillary, resp. rate 18, height 5' 9.5" (1.765 m), weight 390 lb 8 oz (177.13 kg), SpO2 97 %.  General: Well developed, well nourished, male in no acute distress Head: Eyes PERRLA, No xanthomas.   Normocephalic and atraumatic, oropharynx without edema or exudate. Dentition: good Lungs: few rales bases, good air exchange Heart: HRRR S1 S2, no rub/gallop, no murrmur. pulses are 2+ all 4 extrem.   Neck: No carotid bruits. No  lymphadenopathy.  JVD not seen due to body habitus. Abdomen: Bowel sounds present, abdomen soft and non-tender without masses or hernias noted. Msk:  No spine or cva tenderness. No weakness, no joint deformities or effusions. Extremities: No clubbing or cyanosis. No edema.  Neuro: Alert and oriented X 3. No focal deficits noted. Psych:  Good affect, responds appropriately Skin: No rashes or lesions noted.  Labs:   Lab Results  Component Value Date   WBC 4.2 06/21/2015   HGB 11.6* 06/21/2015   HCT 36.9*  06/21/2015   MCV 86.2 06/21/2015   PLT 206 06/21/2015     Recent Labs Lab 06/21/15 0341  NA 146*  K 3.4*  CL 102  CO2 35*  BUN 18  CREATININE 1.72*  CALCIUM 9.1  GLUCOSE 96   MAGNESIUM  Date Value Ref Range Status  06/21/2015 2.1 1.7 - 2.4 mg/dL Final    Recent Labs  06/21/15 0029 06/21/15 0341 06/21/15 0548  TROPONINI 0.05* 0.04* 0.04*    Recent Labs  06/20/15 1834  TROPIPOC 0.03   B NATRIURETIC PEPTIDE  Date/Time Value Ref Range Status  06/20/2015 06:25 PM 394.0* 0.0 - 100.0 pg/mL Final  05/18/2015 10:08 PM 429.7* 0.0 - 100.0 pg/mL Final   Lab Results  Component Value Date   CHOL 175 02/21/2015   HDL 66 02/21/2015   LDLCALC 102* 02/21/2015   TRIG 37 02/21/2015   Echo: 02/21/2015 - Left ventricle: The cavity size was normal. Wall thickness was  increased in a pattern of moderate LVH. Systolic function was  mildly to moderately reduced. The estimated ejection fraction was  in the range of 40% to 45%. Doppler parameters are consistent  with abnormal left ventricular relaxation (grade 1 diastolic  dysfunction). - Aortic valve: There was mild regurgitation.  ECG:  06/21/2015 SR, T wave inversions I and AVL, no sig change from 04/2015  Radiology:  Dg Chest 2 View 06/20/2015  CLINICAL DATA:  Chest pain, left side back pain, shortness of breath starting today EXAM: CHEST  2 VIEW COMPARISON:  05/18/2015 FINDINGS: Cardiomediastinal silhouette is  stable. No acute infiltrate or pleural effusion. No pulmonary edema. Mild elevation of the right hemidiaphragm again noted. IMPRESSION: No active cardiopulmonary disease. Electronically Signed   By: Lahoma Crocker M.D.   On: 06/20/2015 18:06   Ct Angio Chest Pe W/cm &/or Wo Cm 06/20/2015  CLINICAL DATA:  Acute onset of shortness of breath while doing laundry today. Personal history of pulmonary emboli. EXAM: CT ANGIOGRAPHY CHEST WITH CONTRAST TECHNIQUE: Multidetector CT imaging of the chest was performed using the standard protocol during bolus administration of intravenous contrast. Multiplanar CT image reconstructions and MIPs were obtained to evaluate the vascular anatomy. CONTRAST:  100 cc Isovue 370 COMPARISON:  Radiography same day.  CT 04/26/2014 FINDINGS: The lungs are clear. Pulmonary arterial opacification is moderate. No pulmonary emboli. The aorta appears normal. No visible coronary artery calcification. No pleural or pericardial fluid. No acute bone finding. Review of the MIP images confirms the above findings. IMPRESSION: Negative exam. Limited detail because of patient's size. The lungs are clear. No pulmonary emboli seen. Electronically Signed   By: Nelson Chimes M.D.   On: 06/20/2015 21:25    ASSESSMENT AND PLAN:   The patient was seen today by Dr [name], the patient evaluated and the data reviewed.    Palpitations - SVT on telemetry - very limited symptoms from this and does not happen often. - keep on telemetry - not on BB but has hx resp issues, possible asthmatic bronchitis - Dr Melvyn Novas saw in consult 08/2007  Principal Problem:   Acute on chronic respiratory failure with hypoxia (HCC) - no volume overload by exam, no PE by CT but the exam was limited. - per IM - will ck echo  Active Problems:   OSA on CPAP - encouraged compliance    Hypokalemia - per IM, supp ordered  Otherwise, per IM   Essential hypertension   Gout   Chest discomfort   Signed: Rosaria Ferries,  PA-C 06/21/2015 10:20  AM Beeper [id number]  Co-Sign MD The patient has been seen in conjunction with Rosaria Ferries, PA-C. All aspects of care have been considered and discussed. The patient has been personally interviewed, examined, and all clinical data has been reviewed.   Had a relatively short episode of PSVT at 175 bpm, lasting up proximally 15 minutes without overt symptoms.  Agree with monitoring to see what the frequency of the arrhythmia is. If significant recurrences, calcium channel blocker therapy can be started, either verapamil or diltiazem.  Echo to assess cardiac structural integrity.  Will need a 30 day continuous monitor at discharge.

## 2015-06-21 NOTE — Progress Notes (Signed)
Lovenox per Pharmacy for DVT Prophylaxis    Pharmacy has been consulted from dosing enoxaparin (lovenox) in this patient for DVT prophylaxis.  The pharmacist has reviewed pertinent labs (Hgb _12__; PLT__208_), patient weight (_177__kg) and renal function (CrCl_~77__mL/min) and decided that enoxaparin _80_mg SQ Q_24_Hrs is appropriate for this patient.  The pharmacy department will sign off at this time.  Please reconsult pharmacy if status changes or for further issues.  Thank you  Cyndia Diver PharmD, BCPS  06/21/2015, 12:17 AM

## 2015-06-21 NOTE — Progress Notes (Addendum)
[facility] PROGRESS NOTE    Progress Note   Phillip [name] [id number] DOB: 01-07-1961 DOA: 06/20/2015 PCP: [name], MD   Brief Narrative:   Phillip [name] is an 55 y.o. male past medical history of obesity, OSA, combined systolic and diastolic heart failure with an EF of 40% that comes in for dyspnea on exertion and mild chest tightness and palpitations intermittently.  Assessment/Plan:   Acute on chronic respiratory failure with hypoxia (HCC) With chest tightness: Cardiac biomarkers have been flat likely due to chronic renal disease. Lungs are clear to auscultation. CT of the chest show no acute PEs he has no swelling of the lower extremities. He has been monitored on telemetry with episodes of SVTs that correlate with his episodes of chest tightness and dyspnea. I will consult cardiology. Had an episode of dyspnea and chest tightness as I was speaking to him, this correlates with the episodes of SVTs or tachycardia he was having on telemetry, and both of them resolved spontaneously.  Chronic combined systolic and diastolic heart failure: With an EF of 40%. BNP mildly elevated without lower extremity edema, continue torsemide, Coreg and BiDil.  Hypokalemia: Continue to replete orally recheck a magnesium level.  Essential hypertension: Uncontrolled continue titrate antihypertensive medications, DC hydralazine IV when necessary.  Chronic kidney disease stage III: Seems to be at baseline.  Obstructive sleep apnea and obesity hypoventilation syndrome: Continue oxygen and BiPAP at night.    DVT Prophylaxis - Lovenox ordered.  Family Communication: none Disposition Plan: Unable to determined. Code Status:     Code Status Orders        Start     Ordered   06/21/15 0008  Full code   Continuous     06/21/15 0010    Code Status History    Date Active Date Inactive Code Status Order ID Comments User Context   05/19/2015  1:03 AM 05/23/2015  4:05 PM Full Code  [id number]  Ivor Costa, MD ED   03/07/2015  9:25 PM 03/11/2015  8:40 PM Full Code [id number]  Toy Baker, MD Inpatient   02/20/2015 11:02 PM 02/22/2015  5:00 PM Full Code [id number]  Ivor Costa, MD ED   05/07/2014  1:42 AM 05/09/2014  8:33 PM Full Code [id number]  Toy Baker, MD Inpatient        IV Access:    Peripheral IV   Procedures and diagnostic studies:   Dg Chest 2 View  06/20/2015  CLINICAL DATA:  Chest pain, left side back pain, shortness of breath starting today EXAM: CHEST  2 VIEW COMPARISON:  05/18/2015 FINDINGS: Cardiomediastinal silhouette is stable. No acute infiltrate or pleural effusion. No pulmonary edema. Mild elevation of the right hemidiaphragm again noted. IMPRESSION: No active cardiopulmonary disease. Electronically Signed   By: Lahoma Crocker M.D.   On: 06/20/2015 18:06   Ct Angio Chest Pe W/cm &/or Wo Cm  06/20/2015  CLINICAL DATA:  Acute onset of shortness of breath while doing laundry today. Personal history of pulmonary emboli. EXAM: CT ANGIOGRAPHY CHEST WITH CONTRAST TECHNIQUE: Multidetector CT imaging of the chest was performed using the standard protocol during bolus administration of intravenous contrast. Multiplanar CT image reconstructions and MIPs were obtained to evaluate the vascular anatomy. CONTRAST:  100 cc Isovue 370 COMPARISON:  Radiography same day.  CT 04/26/2014 FINDINGS: The lungs are clear. Pulmonary arterial opacification is moderate. No pulmonary emboli. The aorta appears normal. No visible coronary artery calcification. No pleural or pericardial fluid. No acute bone finding. Review  of the MIP images confirms the above findings. IMPRESSION: Negative exam. Limited detail because of patient's size. The lungs are clear. No pulmonary emboli seen. Electronically Signed   By: Nelson Chimes M.D.   On: 06/20/2015 21:25     Medical Consultants:    None.  Anti-Infectives:   Anti-infectives    None      Subjective:    Phillip [name] he  relates he is at episode of chest tightness and dyspnea while laying in bed.  Objective:    Filed Vitals:   06/21/15 0600 06/21/15 0641 06/21/15 0744 06/21/15 0827  BP: 181/107 154/83 143/69 163/92  Pulse: 65 67 83 124  Temp: 98.1 F (36.7 C)     TempSrc: Axillary     Resp: 18   18  Height:      Weight:      SpO2: 100%   97%    Intake/Output Summary (Last 24 hours) at 06/21/15 0854 Last data filed at 06/21/15 0602  Gross per 24 hour  Intake      0 ml  Output    525 ml  Net   -525 ml   Filed Weights   06/21/15 0012  Weight: 177.13 kg (390 lb 8 oz)    Exam: Gen:  NAD, Morbidly obese. Cardiovascular:  RRR. Chest and lungs:   CTAB Abdomen:  Abdomen soft, NT/ND, + BS Extremities:  No edema   Data Reviewed:    Labs: Basic Metabolic Panel:  Recent Labs Lab 06/20/15 1825 06/21/15 0341  NA 143 146*  K 3.4* 3.4*  CL 102 102  CO2 32 35*  GLUCOSE 112* 96  BUN 17 18  CREATININE 1.81* 1.72*  CALCIUM 9.3 9.1   GFR Estimated Creatinine Clearance: 81 mL/min (by C-G formula based on Cr of 1.72). Liver Function Tests: No results for input(s): AST, ALT, ALKPHOS, BILITOT, PROT, ALBUMIN in the last 168 hours. No results for input(s): LIPASE, AMYLASE in the last 168 hours. No results for input(s): AMMONIA in the last 168 hours. Coagulation profile No results for input(s): INR, PROTIME in the last 168 hours.  CBC:  Recent Labs Lab 06/20/15 1825 06/21/15 0341  WBC 4.4 4.2  HGB 12.0* 11.6*  HCT 37.3* 36.9*  MCV 83.8 86.2  PLT 208 206   Cardiac Enzymes:  Recent Labs Lab 06/21/15 0029 06/21/15 0341 06/21/15 0548  TROPONINI 0.05* 0.04* 0.04*   BNP (last 3 results) No results for input(s): PROBNP in the last 8760 hours. CBG: No results for input(s): GLUCAP in the last 168 hours. D-Dimer: No results for input(s): DDIMER in the last 72 hours. Hgb A1c: No results for input(s): HGBA1C in the last 72 hours. Lipid Profile: No results for input(s): CHOL,  HDL, LDLCALC, TRIG, CHOLHDL, LDLDIRECT in the last 72 hours. Thyroid function studies: No results for input(s): TSH, T4TOTAL, T3FREE, THYROIDAB in the last 72 hours.  Invalid input(s): FREET3 Anemia work up: No results for input(s): VITAMINB12, FOLATE, FERRITIN, TIBC, IRON, RETICCTPCT in the last 72 hours. Sepsis Labs:  Recent Labs Lab 06/20/15 1825 06/21/15 0341  WBC 4.4 4.2   Microbiology No results found for this or any previous visit (from the past 240 hour(s)).   Medications:   . aspirin  81 mg Oral Daily  . carvedilol  12.5 mg Oral BID WC  . cloNIDine  0.1 mg Oral Daily  . enoxaparin (LOVENOX) injection  80 mg Subcutaneous QHS  . hydrALAZINE  50 mg Oral BID  . isosorbide mononitrate  30  mg Oral Daily  . montelukast  10 mg Oral QHS  . pantoprazole  40 mg Oral Daily  . [START ON 06/22/2015] pneumococcal 23 valent vaccine  0.5 mL Intramuscular Tomorrow-1000  . torsemide  40 mg Oral Daily   Continuous Infusions:   Time spent: 25 min     Charlynne Cousins  [facility] Pager [phone]  *Please refer to [email or web address], password [email or web address] to get updated schedule on who will round on this patient, as hospitalists switch teams weekly. If 7PM-7AM, please contact night-coverage at [email or web address], password [email or web address] for any overnight needs.  06/21/2015, 8:54 AM

## 2015-06-21 NOTE — Progress Notes (Signed)
Pt was up using the bathroom and CMT notified this RN that patient HR was in 170's-180's. Patient could feel HR increase, but did not experience any pain. VSS. EKG was obtained when patient got back in bed, but HR had dropped and patient was in sinus rhythm. Dr. Aileen Fass was made aware and assessed patient. Will continue to monitor.

## 2015-06-21 NOTE — Care Management Note (Signed)
Case Management Note  Patient Details  Name: Etta Gleave MRN: [id number] Date of Birth: 06-07-1960  Subjective/Objective: 55 y/o m admitted w/Acute on chronic resp failure. From home. Patient has Active w/[employer] home 02-currently uses 02 2l [place] continuously, has travel tank.  Contacted [employer] rep Renard Hamper will talk to PCP/patient once @ home to discuss portables appropriate. Patient voiced understanding.                 Action/Plan:d/c home.   Expected Discharge Date:                  Expected Discharge Plan:  Home/Self Care  In-House Referral:     Discharge planning Services  CM Consult  Post Acute Care Choice:  Durable Medical Equipment ([employer]-home 02 2l [place] continuous) Choice offered to:     DME Arranged:    DME Agency:     HH Arranged:    HH Agency:     Status of Service:  Completed, signed off  Medicare Important Message Given:    Date Medicare IM Given:    Medicare IM give by:    Date Additional Medicare IM Given:    Additional Medicare Important Message give by:     If discussed at [place] of Stay Meetings, dates discussed:    Additional Comments:  Dessa Phi, RN 06/21/2015, 12:58 PM

## 2015-06-22 ENCOUNTER — Other Ambulatory Visit: Payer: Self-pay | Admitting: Physician Assistant

## 2015-06-22 ENCOUNTER — Institutional Professional Consult (permissible substitution): Payer: Self-pay | Admitting: Internal Medicine

## 2015-06-22 DIAGNOSIS — I471 Supraventricular tachycardia, unspecified: Secondary | ICD-10-CM | POA: Diagnosis present

## 2015-06-22 DIAGNOSIS — R0789 Other chest pain: Secondary | ICD-10-CM | POA: Diagnosis not present

## 2015-06-22 DIAGNOSIS — J9621 Acute and chronic respiratory failure with hypoxia: Secondary | ICD-10-CM | POA: Diagnosis not present

## 2015-06-22 DIAGNOSIS — E876 Hypokalemia: Secondary | ICD-10-CM | POA: Diagnosis not present

## 2015-06-22 DIAGNOSIS — I10 Essential (primary) hypertension: Secondary | ICD-10-CM | POA: Diagnosis not present

## 2015-06-22 MED ORDER — POTASSIUM CHLORIDE ER 10 MEQ PO TBCR: 10.0000 meq | EXTENDED_RELEASE_TABLET | Freq: Every day | ORAL | Status: DC

## 2015-06-22 MED ORDER — DILTIAZEM HCL ER COATED BEADS 120 MG PO CP24
120.0000 mg | ORAL_CAPSULE | Freq: Every day | ORAL | Status: DC
  Administered 2015-06-22: 120 mg via ORAL
  Filled 2015-06-22: qty 1

## 2015-06-22 MED ORDER — DILTIAZEM HCL ER COATED BEADS 120 MG PO CP24: 120.0000 mg | ORAL_CAPSULE | Freq: Every day | ORAL | Status: DC

## 2015-06-22 NOTE — Discharge Summary (Signed)
Physician Discharge Summary  Albie Cordray [id number] DOB: 12/06/1960 DOA: 06/20/2015  PCP: [name], MD  Admit date: 06/20/2015 Discharge date: 06/22/2015  Time spent: 35 minutes  Recommendations for Outpatient Follow-up:  Follow-up with cardiology as an outpatient for 30 day event monitor. Basic metabolic panel to monitor his potassium.  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 On the day of admission he try to do laundry and he was winded from simple task so he decided to come to the ED.  Hospital Course:  Acute on chronic respiratory failure with hypoxia and chest tightness: Unclear etiology, cardiac biomarkers remained flaccid and had no further chest pain. CT MG of the chest was negative for PE. 2-D echo was done that showed an EF of 55% with grade 1 diastolic heart failure. He was monitored on telemetry and he had 2-3 episode of SVT that correlated with his chest tightness and this dyspnea. Cardiology was consulted who recommended to start it on Cardizem and have a 30 day event monitor as an outpatient.  Chronic, systolic and diastolic heart failure: No changes were made to his medication continue preadmission regimen.  Hypokalemia: It was repleted now resolved, likely due to diuretic therapy would go home on low-dose potassium supplements.  Essential hypertension: Blood pressure remained stable  throughout her hospital stay no changes were made.  Chronic kidney disease stage III: Creatinine remained at baseline.  Obstructive sleep apnea/obesity hypoventilation syndrome: Continue BiPAP.   Procedures:  CTA of the chest  2-D echo  Consultations:  Cardiology  Discharge Exam: Filed Vitals:   06/22/15 0431 06/22/15 0941  BP: 129/54 140/67  Pulse: 62 61  Temp: 97.8 F (36.6 C) 98.2 F (36.8 C)  Resp: 14 14    General: A&O x 3 Cardiovascular: RRR Respiratory: good air movement CTA B/L  Discharge Instructions   Discharge Instructions    Diet - low sodium heart healthy    Complete by:  As directed      Increase activity slowly    Complete by:  As directed           Current Discharge Medication List    START taking these medications   Details  diltiazem (CARDIZEM CD) 120 MG 24 hr capsule Take 1 capsule (120 mg total) by mouth daily. Qty: 30 capsule, Refills: 0    potassium chloride (K-DUR) 10 MEQ tablet Take 1 tablet (10 mEq total) by mouth daily. Qty: 30 tablet, Refills: 0      CONTINUE these medications which have NOT CHANGED   Details  albuterol (PROVENTIL HFA;VENTOLIN HFA) 108 (90 BASE) MCG/ACT inhaler Inhale 2 puffs into the lungs every 6 (six) hours as needed for shortness of breath. Qty: 18 g, Refills: 0    aspirin 81 MG chewable tablet Chew 1 tablet (81 mg total) by mouth daily. Qty: 30 tablet, Refills: 3    carvedilol (COREG) 12.5 MG tablet Take 1  tablet (12.5 mg total) by mouth 2 (two) times daily with a meal. Qty: 60 tablet, Refills: 3    cloNIDine (CATAPRES) 0.1 MG tablet Take 0.1 mg by mouth daily.    hydrALAZINE (APRESOLINE) 50 MG tablet Take 50 mg by mouth 2 (two) times daily.    montelukast (SINGULAIR) 10 MG tablet Take 10 mg by mouth at bedtime.    pantoprazole (PROTONIX) 40 MG tablet Take 1 tablet (40 mg total) by mouth daily. Qty: 30 tablet, Refills: 0    torsemide (DEMADEX) 20 MG tablet Take 40 mg by mouth daily.     atorvastatin (LIPITOR) 40 MG tablet Take 1 tablet (40 mg total) by mouth daily at 6 PM. Qty: 30 tablet, Refills: 0    colchicine 0.6 MG tablet Take 0.6 mg by mouth daily as needed. For gout    docusate sodium (COLACE) 100 MG capsule Take 1 capsule (100 mg total) by mouth 2 (two) times daily. Qty: 10 capsule, Refills: 0    febuxostat (ULORIC) 40 MG tablet Take 1 tablet (40 mg total) by mouth daily. Qty: 30 tablet, Refills: 0    guaiFENesin (MUCINEX) 600 MG 12 hr tablet Take 2 tablets (1,200 mg total) by mouth 2 (two) times daily. Qty: 20 tablet, Refills: 0    isosorbide-hydrALAZINE (BIDIL) 20-37.5 MG tablet Take 1.5 tablets by mouth 2 (two) times daily. Qty: 90 tablet, Refills: 3    predniSONE (DELTASONE) 10 MG tablet Please take 40 mg oral daily for 3 days, then 30 mg oral daily for 2 days, then 20 mg oral daily for 2 days, then 10 mg oral for 2 days, then stop Qty: 24 tablet, Refills: 0      STOP taking these medications     guaifenesin (ROBITUSSIN) 100 MG/5ML syrup      guaiFENesin-dextromethorphan (ROBITUSSIN DM) 100-10 MG/5ML syrup      isosorbide mononitrate (IMDUR) 30 MG 24 hr tablet      pseudoephedrine-dextromethorphan-guaifenesin (ROBITUSSIN-PE) 30-10-100 MG/5ML solution        No Known Allergies Follow-up Information    Follow up with [facility] On 06/26/2015.   Specialty:  Cardiology   Why:  Get monitor put on at 2:30 pm   Contact information:   [address] [place] [address] [phone]      Follow up with Sinclair Grooms, MD On 08/06/2015.   Specialty:  Cardiology   Why:  See MD at 4:00 pm, please arrive 15 minutes early for paperwork.    Contact information:   [id number] [place] [address] [phone]        The results of significant diagnostics from this hospitalization (including imaging, microbiology, ancillary and laboratory) are listed below for reference.     Significant Diagnostic Studies: Dg Chest 2 View  06/20/2015  CLINICAL DATA:  Chest pain, left side back pain, shortness of breath starting today EXAM: CHEST  2 VIEW COMPARISON:  05/18/2015 FINDINGS: Cardiomediastinal silhouette is stable. No acute infiltrate or pleural effusion. No pulmonary edema. Mild elevation of the right hemidiaphragm again noted. IMPRESSION: No active cardiopulmonary disease. Electronically Signed   By: Lahoma Crocker M.D.   On: 06/20/2015 18:06   Ct Angio Chest Pe W/cm &/or Wo Cm  06/20/2015  CLINICAL DATA:  Acute onset of shortness of breath while doing laundry today. Personal history of pulmonary emboli. EXAM: CT ANGIOGRAPHY CHEST WITH CONTRAST TECHNIQUE: Multidetector CT imaging of the chest was performed using the  standard protocol during bolus administration of intravenous contrast. Multiplanar CT image reconstructions and MIPs were obtained to evaluate the vascular anatomy. CONTRAST:  100 cc Isovue 370 COMPARISON:  Radiography same day.  CT 04/26/2014 FINDINGS: The lungs are clear. Pulmonary arterial opacification is moderate. No pulmonary emboli. The aorta appears normal. No visible coronary artery calcification. No pleural or pericardial fluid. No acute bone finding. Review of the MIP images confirms the above findings. IMPRESSION: Negative exam. Limited detail because of patient's size. The lungs are clear. No pulmonary emboli seen. Electronically Signed   By: Nelson Chimes M.D.   On: 06/20/2015 21:25    Microbiology: No results found for this or any previous visit (from the past 240 hour(s)).   Labs: Basic Metabolic Panel:  Recent Labs Lab 06/20/15 1825 06/21/15 0341 06/21/15 0546  NA 143 146*  --   K 3.4* 3.4*  --   CL 102 102  --   CO2 32 35*  --   GLUCOSE 112* 96  --   BUN 17 18  --   CREATININE 1.81* 1.72*  --   CALCIUM 9.3 9.1  --   MG  --   --  2.1   Liver Function Tests: No results for input(s): AST, ALT, ALKPHOS, BILITOT, PROT, ALBUMIN in the  last 168 hours. No results for input(s): LIPASE, AMYLASE in the last 168 hours. No results for input(s): AMMONIA in the last 168 hours. CBC:  Recent Labs Lab 06/20/15 1825 06/21/15 0341  WBC 4.4 4.2  HGB 12.0* 11.6*  HCT 37.3* 36.9*  MCV 83.8 86.2  PLT 208 206   Cardiac Enzymes:  Recent Labs Lab 06/21/15 0029 06/21/15 0341 06/21/15 0548  TROPONINI 0.05* 0.04* 0.04*   BNP: BNP (last 3 results)  Recent Labs  03/07/15 1806 05/18/15 2208 06/20/15 1825  BNP 282.4* 429.7* 394.0*    ProBNP (last 3 results) No results for input(s): PROBNP in the last 8760 hours.  CBG: No results for input(s): GLUCAP in the last 168 hours.    Signed:  Charlynne Cousins MD.  [facility] 06/22/2015, 10:41 AM

## 2015-06-22 NOTE — Progress Notes (Signed)
Completed D/C teaching with patient. Gave prescriptions. Went over medications. Patient will be D/C home in stable condition.

## 2015-06-22 NOTE — Progress Notes (Signed)
Patient Name: Phillip [name] Date of Encounter: 06/22/2015  Principal Problem:   Acute on chronic respiratory failure with hypoxia (HCC) Active Problems:   OSA on CPAP   Hypokalemia   Essential hypertension   Gout   Chest discomfort   SVT (supraventricular tachycardia) [facility])   Primary Cardiologist: Dr [name] Patient Profile: 55 y.o. year old male with a history of PE 2010 2nd inactivity, DVT, OSA on CPAP, HTN, morbid obesity, CKD III, gout. Hx EF 40% 01/2015 (did not f/u cards). Admitted 03/29 w/ DOE/fatigue, cards seeing for SVT.  SUBJECTIVE: No more episodes that he felt, no chest pain, no SOB.  OBJECTIVE Filed Vitals:   06/21/15 1458 06/21/15 2027 06/21/15 2242 06/22/15 0431  BP: 129/69 152/67  129/54  Pulse: 67 57 72 62  Temp: 98.4 F (36.9 C) 98.2 F (36.8 C)  97.8 F (36.6 C)  TempSrc: Oral Oral  Axillary  Resp: 20 16  14   Height:      Weight:      SpO2: 99% 100%  100%    Intake/Output Summary (Last 24 hours) at 06/22/15 0850 Last data filed at 06/22/15 0800  Gross per 24 hour  Intake   1440 ml  Output    625 ml  Net    815 ml   Filed Weights   06/21/15 0012  Weight: 390 lb 8 oz (177.13 kg)    PHYSICAL EXAM General: Well developed, well nourished, male in no acute distress. Head: Normocephalic, atraumatic.  Neck: Supple without bruits, JVD not able to assess Lungs:  Resp regular and unlabored, CTA. Heart: RRR, S1, S2, no S3, S4, or murmur; no rub. Abdomen: Soft, non-tender, non-distended, BS + x 4.  Extremities: No clubbing, cyanosis, edema.  Neuro: Alert and oriented X 3. Moves all extremities spontaneously. Psych: Normal affect.  LABS: CBC:  Recent Labs  06/20/15 1825 06/21/15 0341  WBC 4.4 4.2  HGB 12.0* 11.6*  HCT 37.3* 36.9*  MCV 83.8 86.2  PLT 208 [id number]   Basic Metabolic Panel:  Recent Labs  06/20/15 1825 06/21/15 0341 06/21/15 0546  NA 143 146*  --   K 3.4* 3.4*  --   CL 102 102  --   CO2 32 35*  --   GLUCOSE 112* 96  --     BUN 17 18  --   CREATININE 1.81* 1.72*  --   CALCIUM 9.3 9.1  --   MG  --   --  2.1   Cardiac Enzymes:  Recent Labs  06/21/15 0029 06/21/15 0341 06/21/15 0548  TROPONINI 0.05* 0.04* 0.04*    Recent Labs  06/20/15 1834  TROPIPOC 0.03   BNP:  B NATRIURETIC PEPTIDE  Date/Time Value Ref Range Status  06/20/2015 06:25 PM 394.0* 0.0 - 100.0 pg/mL Final  05/18/2015 10:08 PM 429.7* 0.0 - 100.0 pg/mL Final    TELE:   SR, episodes of ?SVT vs MAT seen on telemetry review.      ECHO: 03/30 - Procedure narrative: Transthoracic echocardiography. Image  quality was adequate. The study was technically difficult, as a  result of poor sound wave transmission and body habitus. - Left ventricle: The cavity size was moderately dilated. There was  moderate concentric hypertrophy. Systolic function was normal.  The estimated ejection fraction was in the range of 55% to 60%.  Doppler parameters are consistent with abnormal left ventricular  relaxation (grade 1 diastolic dysfunction). The E/e&' ratio is  between 8-15, suggesting indeterminate LV filling pressure. -  Aortic valve: Poorly visualized. There was moderate  regurgitation. - Left atrium: The atrium was normal in size. - Inferior vena cava: The vessel was normal in size. The  respirophasic diameter changes were in the normal range (>= 50%),  consistent with normal central venous pressure. Impressions: - Technically difficult study. LVEF 55-60%, moderate LVH, normal  wall motion, diastolic dysfunction, indeterminate LV Filling  pressure, poorly visualized aortic valve without stenosis,  however, there is moderate AI, normal LA size, normal IVC.  Consider TEE to further evaluate the aortic insufficiency.   Radiology/Studies: Dg Chest 2 View 06/20/2015  CLINICAL DATA:  Chest pain, left side back pain, shortness of breath starting today EXAM: CHEST  2 VIEW COMPARISON:  05/18/2015 FINDINGS: Cardiomediastinal  silhouette is stable. No acute infiltrate or pleural effusion. No pulmonary edema. Mild elevation of the right hemidiaphragm again noted. IMPRESSION: No active cardiopulmonary disease. Electronically Signed   By: Lahoma Crocker M.D.   On: 06/20/2015 18:06   Ct Angio Chest Pe W/cm &/or Wo Cm 06/20/2015  CLINICAL DATA:  Acute onset of shortness of breath while doing laundry today. Personal history of pulmonary emboli. EXAM: CT ANGIOGRAPHY CHEST WITH CONTRAST TECHNIQUE: Multidetector CT imaging of the chest was performed using the standard protocol during bolus administration of intravenous contrast. Multiplanar CT image reconstructions and MIPs were obtained to evaluate the vascular anatomy. CONTRAST:  100 cc Isovue 370 COMPARISON:  Radiography same day.  CT 04/26/2014 FINDINGS: The lungs are clear. Pulmonary arterial opacification is moderate. No pulmonary emboli. The aorta appears normal. No visible coronary artery calcification. No pleural or pericardial fluid. No acute bone finding. Review of the MIP images confirms the above findings. IMPRESSION: Negative exam. Limited detail because of patient's size. The lungs are clear. No pulmonary emboli seen. Electronically Signed   By: Nelson Chimes M.D.   On: 06/20/2015 21:25     Current Medications:  . aspirin  81 mg Oral Daily  . carvedilol  12.5 mg Oral BID WC  . cloNIDine  0.1 mg Oral Daily  . enoxaparin (LOVENOX) injection  80 mg Subcutaneous QHS  . isosorbide-hydrALAZINE  1.5 tablet Oral BID  . montelukast  10 mg Oral QHS  . pantoprazole  40 mg Oral Daily  . pneumococcal 23 valent vaccine  0.5 mL Intramuscular Tomorrow-1000  . polyethylene glycol  17 g Oral Daily  . torsemide  40 mg Oral Daily      ASSESSMENT AND PLAN:   SVT (supraventricular tachycardia) (HCC) - episodes overnight, pt did not feel them - think BP will tolerate low-dose verapamil or Diltiazem - Will start Diltiazem CD 120 mg qd - Event monitor and f/u appt scheduled - EF OK by  echo, results above. - MD to see later today, after that, OK for DC.    AI - see echo report, MD advise if TEE needed  Otherwise, per IM Principal Problem:   Acute on chronic respiratory failure with hypoxia ([place]) Active Problems:   OSA on CPAP   Hypokalemia   Essential hypertension   Gout   Chest discomfort    Signed, Barrett, Suanne Marker , PA-C 8:50 AM 06/22/2015 The patient has been seen in conjunction with Rosaria Ferries, PA-C. All aspects of care have been considered and discussed. The patient has been personally interviewed, examined, and all clinical data has been reviewed.   Agree with the above documentation and recommendations. The patient is a 30 day continuous ambulatory monitor. Low-dose diltiazem therapy has been started and can be titrated  as needed basis upon findings from the monitor.  Please call if we may be of further assistance.

## 2015-06-26 ENCOUNTER — Ambulatory Visit (INDEPENDENT_AMBULATORY_CARE_PROVIDER_SITE_OTHER): Payer: Managed Care, Other (non HMO)

## 2015-06-26 DIAGNOSIS — I471 Supraventricular tachycardia: Secondary | ICD-10-CM | POA: Diagnosis not present

## 2015-07-04 ENCOUNTER — Emergency Department (HOSPITAL_COMMUNITY): Payer: Managed Care, Other (non HMO)

## 2015-07-04 ENCOUNTER — Inpatient Hospital Stay (HOSPITAL_COMMUNITY)
Admission: EM | Admit: 2015-07-04 | Discharge: 2015-07-08 | DRG: 194 | Disposition: A | Discharge status: Home / Self Care | Payer: Managed Care, Other (non HMO) | Attending: Internal Medicine | Admitting: Internal Medicine

## 2015-07-04 ENCOUNTER — Encounter (HOSPITAL_COMMUNITY): Payer: Self-pay | Admitting: Emergency Medicine

## 2015-07-04 DIAGNOSIS — Z79899 Other long term (current) drug therapy: Secondary | ICD-10-CM | POA: Diagnosis not present

## 2015-07-04 DIAGNOSIS — G4733 Obstructive sleep apnea (adult) (pediatric): Secondary | ICD-10-CM | POA: Diagnosis present

## 2015-07-04 DIAGNOSIS — I509 Heart failure, unspecified: Secondary | ICD-10-CM | POA: Diagnosis present

## 2015-07-04 DIAGNOSIS — M109 Gout, unspecified: Secondary | ICD-10-CM | POA: Diagnosis present

## 2015-07-04 DIAGNOSIS — Z86711 Personal history of pulmonary embolism: Secondary | ICD-10-CM

## 2015-07-04 DIAGNOSIS — Y95 Nosocomial condition: Secondary | ICD-10-CM | POA: Diagnosis present

## 2015-07-04 DIAGNOSIS — J1000 Influenza due to other identified influenza virus with unspecified type of pneumonia: Secondary | ICD-10-CM | POA: Diagnosis present

## 2015-07-04 DIAGNOSIS — Z833 Family history of diabetes mellitus: Secondary | ICD-10-CM

## 2015-07-04 DIAGNOSIS — Z8249 Family history of ischemic heart disease and other diseases of the circulatory system: Secondary | ICD-10-CM | POA: Diagnosis not present

## 2015-07-04 DIAGNOSIS — J1100 Influenza due to unidentified influenza virus with unspecified type of pneumonia: Secondary | ICD-10-CM | POA: Diagnosis not present

## 2015-07-04 DIAGNOSIS — R748 Abnormal levels of other serum enzymes: Secondary | ICD-10-CM | POA: Diagnosis present

## 2015-07-04 DIAGNOSIS — I110 Hypertensive heart disease with heart failure: Secondary | ICD-10-CM | POA: Diagnosis present

## 2015-07-04 DIAGNOSIS — I10 Essential (primary) hypertension: Secondary | ICD-10-CM | POA: Diagnosis present

## 2015-07-04 DIAGNOSIS — R0902 Hypoxemia: Secondary | ICD-10-CM | POA: Diagnosis present

## 2015-07-04 DIAGNOSIS — Z9989 Dependence on other enabling machines and devices: Secondary | ICD-10-CM

## 2015-07-04 DIAGNOSIS — R778 Other specified abnormalities of plasma proteins: Secondary | ICD-10-CM | POA: Diagnosis present

## 2015-07-04 DIAGNOSIS — E876 Hypokalemia: Secondary | ICD-10-CM | POA: Diagnosis present

## 2015-07-04 DIAGNOSIS — J189 Pneumonia, unspecified organism: Secondary | ICD-10-CM | POA: Diagnosis not present

## 2015-07-04 DIAGNOSIS — R7989 Other specified abnormal findings of blood chemistry: Secondary | ICD-10-CM

## 2015-07-04 DIAGNOSIS — E662 Morbid (severe) obesity with alveolar hypoventilation: Secondary | ICD-10-CM | POA: Diagnosis present

## 2015-07-04 DIAGNOSIS — Z6841 Body Mass Index (BMI) 40.0 and over, adult: Secondary | ICD-10-CM

## 2015-07-04 DIAGNOSIS — Z7982 Long term (current) use of aspirin: Secondary | ICD-10-CM

## 2015-07-04 DIAGNOSIS — R05 Cough: Secondary | ICD-10-CM | POA: Diagnosis present

## 2015-07-04 DIAGNOSIS — A419 Sepsis, unspecified organism: Secondary | ICD-10-CM

## 2015-07-04 LAB — BASIC METABOLIC PANEL
ANION GAP: 8 (ref 5–15)
BUN: 18 mg/dL (ref 6–20)
CALCIUM: 9.2 mg/dL (ref 8.9–10.3)
CO2: 33 mmol/L — AB (ref 22–32)
CREATININE: 1.95 mg/dL — AB (ref 0.61–1.24)
Chloride: 101 mmol/L (ref 101–111)
GFR, EST AFRICAN AMERICAN: 44 mL/min — AB (ref >60)
GFR, EST NON AFRICAN AMERICAN: 38 mL/min — AB (ref >60)
Glucose, Bld: 147 mg/dL — ABNORMAL HIGH (ref 65–99)
Potassium: 3.1 mmol/L — ABNORMAL LOW (ref 3.5–5.1)
SODIUM: 142 mmol/L (ref 135–145)

## 2015-07-04 LAB — CBC WITH DIFFERENTIAL/PLATELET
BASOS ABS: 0 10*3/uL (ref 0.0–0.1)
BASOS PCT: 0 %
EOS ABS: 0 10*3/uL (ref 0.0–0.7)
EOS PCT: 1 %
HCT: 41 % (ref 39.0–52.0)
Hemoglobin: 13 g/dL (ref 13.0–17.0)
Lymphocytes Relative: 8 %
Lymphs Abs: 0.4 10*3/uL — ABNORMAL LOW (ref 0.7–4.0)
MCH: 27 pg (ref 26.0–34.0)
MCHC: 31.7 g/dL (ref 30.0–36.0)
MCV: 85.2 fL (ref 78.0–100.0)
MONO ABS: 0.6 10*3/uL (ref 0.1–1.0)
Monocytes Relative: 11 %
Neutro Abs: 4.5 10*3/uL (ref 1.7–7.7)
Neutrophils Relative %: 80 %
PLATELETS: 150 10*3/uL (ref 150–400)
RBC: 4.81 MIL/uL (ref 4.22–5.81)
RDW: 14.1 % (ref 11.5–15.5)
WBC: 5.6 10*3/uL (ref 4.0–10.5)

## 2015-07-04 LAB — TROPONIN I: TROPONIN I: 0.05 ng/mL — AB (ref <0.031)

## 2015-07-04 LAB — BRAIN NATRIURETIC PEPTIDE: B NATRIURETIC PEPTIDE 5: 321.1 pg/mL — AB (ref 0.0–100.0)

## 2015-07-04 LAB — I-STAT CG4 LACTIC ACID, ED: LACTIC ACID, VENOUS: 1.62 mmol/L (ref 0.5–2.0)

## 2015-07-04 MED ORDER — PREDNISONE 20 MG PO TABS
60.0000 mg | ORAL_TABLET | Freq: Once | ORAL | Status: AC
  Administered 2015-07-04: 60 mg via ORAL
  Filled 2015-07-04: qty 3

## 2015-07-04 MED ORDER — HYDROCODONE-HOMATROPINE 5-1.5 MG/5ML PO SYRP
5.0000 mL | ORAL_SOLUTION | Freq: Once | ORAL | Status: AC
Start: 2015-07-04 — End: 2015-07-04
  Administered 2015-07-04: 5 mL via ORAL
  Filled 2015-07-04: qty 5

## 2015-07-04 MED ORDER — ACETAMINOPHEN 500 MG PO TABS
1000.0000 mg | ORAL_TABLET | Freq: Once | ORAL | Status: AC
  Administered 2015-07-04: 1000 mg via ORAL
  Filled 2015-07-04: qty 2

## 2015-07-04 MED ORDER — IPRATROPIUM-ALBUTEROL 0.5-2.5 (3) MG/3ML IN SOLN
3.0000 mL | Freq: Once | RESPIRATORY_TRACT | Status: AC
  Administered 2015-07-04: 3 mL via RESPIRATORY_TRACT
  Filled 2015-07-04: qty 3

## 2015-07-04 MED ORDER — ALBUTEROL SULFATE (2.5 MG/3ML) 0.083% IN NEBU
5.0000 mg | INHALATION_SOLUTION | Freq: Once | RESPIRATORY_TRACT | Status: AC
  Administered 2015-07-04: 5 mg via RESPIRATORY_TRACT
  Filled 2015-07-04: qty 6

## 2015-07-04 MED ORDER — VANCOMYCIN HCL IN DEXTROSE 1-5 GM/200ML-% IV SOLN
1000.0000 mg | Freq: Once | INTRAVENOUS | Status: AC
  Administered 2015-07-04: 1000 mg via INTRAVENOUS
  Filled 2015-07-04: qty 200

## 2015-07-04 MED ORDER — VANCOMYCIN HCL IN DEXTROSE 1-5 GM/200ML-% IV SOLN
1000.0000 mg | Freq: Once | INTRAVENOUS | Status: AC
  Administered 2015-07-05: 1000 mg via INTRAVENOUS
  Filled 2015-07-04: qty 200

## 2015-07-04 MED ORDER — HYDRALAZINE HCL 50 MG PO TABS
50.0000 mg | ORAL_TABLET | Freq: Once | ORAL | Status: AC
  Administered 2015-07-04: 50 mg via ORAL
  Filled 2015-07-04: qty 1

## 2015-07-04 MED ORDER — CEFEPIME HCL 2 G IJ SOLR
2.0000 g | Freq: Once | INTRAMUSCULAR | Status: AC
  Administered 2015-07-04: 2 g via INTRAVENOUS
  Filled 2015-07-04: qty 2

## 2015-07-04 MED ORDER — CARVEDILOL 12.5 MG PO TABS
12.5000 mg | ORAL_TABLET | Freq: Two times a day (BID) | ORAL | Status: DC
  Administered 2015-07-05 – 2015-07-08 (×7): 12.5 mg via ORAL
  Filled 2015-07-04 (×8): qty 1

## 2015-07-04 NOTE — H&P (Signed)
[facility] History and Physical  Phillip [name] [id number] DOB: 1960/09/02 DOA: 07/04/2015  Referring physician: Tomasa Rand, M.D. PCP: [name], MD   Chief Complaint: Cough and pleuritic chest pain  HPI: Phillip [name] is a 55 y.o. male with a past medical history hypertension, gout, OSA on CPAP, morbid obesity, pulmonary embolism, pneumonia, CHF who comes emergency department with above chief complaint.  Per patient, last night he went to bed feeling okay. He states that he usually wakes up like clockwork at 3:30 to go urinate, but last night around 1 AM he woke up with persistent dry cough, that trigger 1 episode of emesis. Since then, he has been coughing very often despite using antitussive medication. He is states that he now has chest pain from all the coughing. He had a low-grade temperature at home, but had 102F fever while in the ER. He denies travel history or sick contacts. He states that he lives alone and given all his medical problems in the last few months, he has unable to work since November.  In the ER, the patient was hypoxic in the 80s without supplemental oxygen. This normalized after the patient was put on nasal cannula oxygen. Workup is significant for mildly elevated troponin and hypokalemia. Chest radiograph did not show any acute findings.   Review of Systems:  Constitutional:  Positive Fevers, chills, fatigue.  Intentional diet induced weight loss, night sweats. HEENT:  No headaches, Difficulty swallowing,Tooth/dental problems,Sore throat,  No sneezing, itching, ear ache, nasal congestion, post nasal drip,  Cardio-vascular:  No chest pain, Orthopnea, PND, swelling in lower extremities, anasarca, dizziness, palpitations  GI:  Positive nausea, vomiting, loss of appetite  No heartburn, indigestion, abdominal pain, diarrhea, change in bowel habits,  Resp:  Positive dyspnea, persistent dry cough, wheezing. No hemoptysis Skin:  no rash or lesions.    GU:  no dysuria, change in color of urine, no urgency or frequency. No flank pain.  Musculoskeletal:  No joint pain or swelling. No decreased range of motion. No back pain.  Psych:  No change in mood or affect. No depression or anxiety. No memory loss.   Past Medical History  Diagnosis Date  . Hypertension   . Gout   . OSA on CPAP   . Morbid obesity ([place])   . Pulmonary embolism ([place])   . PNA (pneumonia)   . CHF (congestive heart failure) [facility])    Past Surgical History  Procedure Laterality Date  . Hernia repair     Social History:  reports that he has never smoked. He does not have any smokeless tobacco history on file. He reports that he does not drink alcohol or use illicit drugs.  No Known Allergies  Family History  Problem Relation Age of Onset  . Diabetes Father   . CAD Father   . Hypertension Other   . Diabetes Other     Prior to Admission medications   Medication Sig Start Date End Date Taking? Authorizing Provider  albuterol (PROVENTIL HFA;VENTOLIN HFA) 108 (90 BASE) MCG/ACT inhaler Inhale 2 puffs into the lungs every 6 (six) hours as needed for shortness of breath. 05/09/14  Yes Modena Jansky, MD  aspirin 81 MG chewable tablet Chew 1 tablet (81 mg total) by mouth daily. Patient taking differently: Chew 81 mg by mouth at bedtime.  02/22/15  Yes Florencia Reasons, MD  carvedilol (COREG) 12.5 MG tablet Take 1 tablet (12.5 mg total) by mouth 2 (two) times daily with a meal. 03/11/15  Yes Barton Dubois, MD  cloNIDine (CATAPRES) 0.1 MG tablet Take 0.1 mg by mouth daily.   Yes Historical Provider, MD  colchicine 0.6 MG tablet Take 0.6 mg by mouth daily as needed (gout flare up.). For gout   Yes Historical Provider, MD  dextromethorphan (ROBITUSSIN 12 HOUR COUGH) 30 MG/5ML liquid Take 15 mg by mouth daily as needed for cough.   Yes Historical Provider, MD  diltiazem (CARDIZEM CD) 120 MG 24 hr capsule Take 1 capsule (120 mg total) by mouth daily. 06/22/15  Yes Charlynne Cousins,  MD  hydrALAZINE (APRESOLINE) 50 MG tablet Take 50 mg by mouth 2 (two) times daily.   Yes Historical Provider, MD  ibuprofen (ADVIL,MOTRIN) 200 MG tablet Take 200 mg by mouth every 6 (six) hours as needed for moderate pain.   Yes Historical Provider, MD  montelukast (SINGULAIR) 10 MG tablet Take 10 mg by mouth at bedtime.   Yes Historical Provider, MD  potassium chloride (K-DUR) 10 MEQ tablet Take 1 tablet (10 mEq total) by mouth daily. 06/22/15  Yes Charlynne Cousins, MD  torsemide (DEMADEX) 20 MG tablet Take 40 mg by mouth daily.   Yes Historical Provider, MD  atorvastatin (LIPITOR) 40 MG tablet Take 1 tablet (40 mg total) by mouth daily at 6 PM. Patient not taking: Reported on 05/18/2015 02/22/15   Florencia Reasons, MD  docusate sodium (COLACE) 100 MG capsule Take 1 capsule (100 mg total) by mouth 2 (two) times daily. Patient not taking: Reported on 06/20/2015 05/23/15   Silver Huguenin Elgergawy, MD  febuxostat (ULORIC) 40 MG tablet Take 1 tablet (40 mg total) by mouth daily. Patient not taking: Reported on 05/18/2015 02/22/15   Florencia Reasons, MD  guaiFENesin (MUCINEX) 600 MG 12 hr tablet Take 2 tablets (1,200 mg total) by mouth 2 (two) times daily. Patient not taking: Reported on 06/20/2015 05/23/15   Silver Huguenin Elgergawy, MD  isosorbide-hydrALAZINE (BIDIL) 20-37.5 MG tablet Take 1.5 tablets by mouth 2 (two) times daily. Patient not taking: Reported on 06/20/2015 03/11/15   Barton Dubois, MD  pantoprazole (PROTONIX) 40 MG tablet Take 1 tablet (40 mg total) by mouth daily. Patient not taking: Reported on 07/04/2015 05/23/15   Silver Huguenin Elgergawy, MD  predniSONE (DELTASONE) 10 MG tablet Please take 40 mg oral daily for 3 days, then 30 mg oral daily for 2 days, then 20 mg oral daily for 2 days, then 10 mg oral for 2 days, then stop Patient not taking: Reported on 06/20/2015 05/23/15   Albertine Patricia, MD   Physical Exam: Marcelline Mates:   07/05/15 0028 07/05/15 0034 07/05/15 0049 07/05/15 0051  BP:   145/77   Pulse: 67  75    Temp:  98.9 F (37.2 C) 99.6 F (37.6 C)   TempSrc:  Oral Oral   Resp:   22   Height:   5\' 10"  (1.778 m)   Weight:   175.1 kg (386 lb 0.4 oz)   SpO2: 97%  97% 99%    Wt Readings from Last 3 Encounters:  07/05/15 175.1 kg (386 lb 0.4 oz)  06/21/15 177.13 kg (390 lb 8 oz)  05/23/15 180.804 kg (398 lb 9.6 oz)    General:  Appears calm and comfortable Eyes: PERRL, normal lids, irises & conjunctiva ENT: grossly normal hearing, lips & tongue. Oral mucosa is dry. Neck: no LAD, masses or thyromegaly Cardiovascular: RRR, no m/r/g. No LE pitting edema. Positive lymphedema Telemetry: SR, no arrhythmias  Respiratory: Bibasilar Rales, bilateral wheezing and  rhonchi. No accessory muscle use. Abdomen: soft, ntnd Skin: no rash or induration seen on limited exam Musculoskeletal: grossly normal tone BUE/BLE Psychiatric: grossly normal mood and affect, speech fluent and appropriate Neurologic: Awake, alert, oriented 4, grossly non-focal.          Labs on Admission:  Basic Metabolic Panel:  Recent Labs Lab 07/04/15 2109  NA 142  K 3.1*  CL 101  CO2 33*  GLUCOSE 147*  BUN 18  CREATININE 1.95*  CALCIUM 9.2   CBC:  Recent Labs Lab 07/04/15 2109  WBC 5.6  NEUTROABS 4.5  HGB 13.0  HCT 41.0  MCV 85.2  PLT 150   Cardiac Enzymes:  Recent Labs Lab 07/04/15 2109  TROPONINI 0.05*    BNP (last 3 results)  Recent Labs  05/18/15 2208 06/20/15 1825 07/04/15 2109  BNP 429.7* 394.0* 321.1*     Radiological Exams on Admission: Dg Chest 2 View  07/04/2015  CLINICAL DATA:  Cough and fever with chest tightness since 1 a.m. this morning EXAM: CHEST  2 VIEW COMPARISON:  06/20/2015 FINDINGS: Heart is borderline in size. Lungs are clear. No effusions. No acute bony abnormality. IMPRESSION: No active cardiopulmonary disease. Electronically Signed   By: Rolm Baptise M.D.   On: 07/04/2015 20:49    EKG: Independently reviewed. Vent. rate 79 BPM PR interval 162 ms QRS duration  97 ms QT/QTc 390/447 ms P-R-T axes 61 -51 144 Age not entered, assumed to be 55 years old for purpose of ECG interpretation Sinus rhythm Borderline left axis deviation Abnormal R-wave progression, late transition Abnormal T, consider ischemia, lateral leads  Assessment/Plan Principal Problem:   HCAP (healthcare-associated pneumonia) Admit to telemetry/inpatient.. Continue supplemental oxygen. Continue bronchodilators as needed. Follow-up blood cultures and sensitivity. Check urine antigens for strep pneumoniae and Legionella. I will continue HCAP antibiotic coverage, at least until influenza results are back.  Active Problems:   OSA on CPAP Continue CPAP ventilation was in the hospital.    Hypokalemia We will replace.    Essential hypertension Continue current antihypertensive therapy. Monitor blood pressure.    Elevated troponin Telemetry monitoring and trend troponin levels.     Code Status: Full code. DVT Prophylaxis: Lovenox SQ. Family Communication:  Disposition Plan: Admit for further evaluation and treatment.  Time spent: About 70 minutes were used in the process of this admission.  Reubin Milan, M.D. [facility] Pager [phone].

## 2015-07-04 NOTE — ED Notes (Signed)
Pt states that he has had a cough with fever and chest tightness since 1am this morning. Alert and oriented.

## 2015-07-04 NOTE — Progress Notes (Signed)
Pharmacy Antibiotic Note  Phillip [name] is a 55 y.o. male admitted on 07/04/2015 with HCAP.  Pharmacy has been consulted for Vancomycin and Cefepime dosing. First doses ordered in the ED. SCr not available yet. Hx CKD.  Plan: Vancomycin loading dose of 2g IV total. F/u SCr and order maintenance regimens of Vanc and Cefepime.  Height: 5' 9.5" (176.5 cm) Weight: (!) 384 lb (174.181 kg) IBW/kg (Calculated) : 71.85  Temp (24hrs), Avg:101.8 F (38.8 C), Min:100.8 F (38.2 C), Max:102.8 F (39.3 C)   Recent Labs Lab 07/04/15 2109  WBC 5.6    Estimated Creatinine Clearance: 80.2 mL/min (by C-G formula based on Cr of 1.72).    No Known Allergies  Antimicrobials this admission: Cefepime 4/12 >>  Vanc 4/12 >>   Dose adjustments this admission:  Microbiology results: 4/12 BCx:  4/12 Influenza panel:   Thank you for allowing pharmacy to be a part of this patient's care.  Romeo Rabon, PharmD, pager [phone]. 07/04/2015,9:57 PM.

## 2015-07-04 NOTE — ED Provider Notes (Signed)
CSN: [id number]     Arrival date & time 07/04/15  1952 History   First MD Initiated Contact with Patient 07/04/15 2005     Chief Complaint  Patient presents with  . Cough  . Chest Pain     (Consider location/radiation/quality/duration/timing/severity/associated sxs/prior Treatment) HPI Patient was discharged from the hospital 12 days ago Patient states that he went to bed feeling all right last night. He was sleeping with his CPAP on with the windows open. At 1 AM he awoke with harsh coughing. He reports he has had coughing ever since. It is very deep and when he coughs hard it hurts in the center of his chest. Cough has been dry. He reports low-grade fever. No increase in baseline lower extremity swelling. He reports taking daily torsemide as prescribed.  Patient reports this is the third time this year he's had this problem. He states he is scheduled to see a pulmonologist on May 5. He has several home inhalers but no nebulizer machine. Past Medical History  Diagnosis Date  . Hypertension   . Gout   . OSA on CPAP   . Morbid obesity ([place])   . Pulmonary embolism ([place])   . PNA (pneumonia)   . CHF (congestive heart failure) [facility])    Past Surgical History  Procedure Laterality Date  . Hernia repair     Family History  Problem Relation Age of Onset  . Diabetes Father   . CAD Father   . Hypertension Other   . Diabetes Other    Social History  Substance Use Topics  . Smoking status: Never Smoker   . Smokeless tobacco: None  . Alcohol Use: No    Review of Systems 10 Systems reviewed and are negative for acute change except as noted in the HPI.    Allergies  Review of patient's allergies indicates no known allergies.  Home Medications   Prior to Admission medications   Medication Sig Start Date End Date Taking? Authorizing Provider  albuterol (PROVENTIL HFA;VENTOLIN HFA) 108 (90 BASE) MCG/ACT inhaler Inhale 2 puffs into the lungs every 6 (six) hours as needed for  shortness of breath. 05/09/14  Yes Modena Jansky, MD  aspirin 81 MG chewable tablet Chew 1 tablet (81 mg total) by mouth daily. Patient taking differently: Chew 81 mg by mouth at bedtime.  02/22/15  Yes Florencia Reasons, MD  carvedilol (COREG) 12.5 MG tablet Take 1 tablet (12.5 mg total) by mouth 2 (two) times daily with a meal. 03/11/15  Yes Barton Dubois, MD  cloNIDine (CATAPRES) 0.1 MG tablet Take 0.1 mg by mouth daily.   Yes Historical Provider, MD  colchicine 0.6 MG tablet Take 0.6 mg by mouth daily as needed (gout flare up.). For gout   Yes Historical Provider, MD  dextromethorphan (ROBITUSSIN 12 HOUR COUGH) 30 MG/5ML liquid Take 15 mg by mouth daily as needed for cough.   Yes Historical Provider, MD  diltiazem (CARDIZEM CD) 120 MG 24 hr capsule Take 1 capsule (120 mg total) by mouth daily. 06/22/15  Yes Charlynne Cousins, MD  hydrALAZINE (APRESOLINE) 50 MG tablet Take 50 mg by mouth 2 (two) times daily.   Yes Historical Provider, MD  ibuprofen (ADVIL,MOTRIN) 200 MG tablet Take 200 mg by mouth every 6 (six) hours as needed for moderate pain.   Yes Historical Provider, MD  montelukast (SINGULAIR) 10 MG tablet Take 10 mg by mouth at bedtime.   Yes Historical Provider, MD  potassium chloride (K-DUR) 10 MEQ tablet Take  1 tablet (10 mEq total) by mouth daily. 06/22/15  Yes Charlynne Cousins, MD  torsemide (DEMADEX) 20 MG tablet Take 40 mg by mouth daily.   Yes Historical Provider, MD  atorvastatin (LIPITOR) 40 MG tablet Take 1 tablet (40 mg total) by mouth daily at 6 PM. Patient not taking: Reported on 05/18/2015 02/22/15   Florencia Reasons, MD  docusate sodium (COLACE) 100 MG capsule Take 1 capsule (100 mg total) by mouth 2 (two) times daily. Patient not taking: Reported on 06/20/2015 05/23/15   Silver Huguenin Elgergawy, MD  febuxostat (ULORIC) 40 MG tablet Take 1 tablet (40 mg total) by mouth daily. Patient not taking: Reported on 05/18/2015 02/22/15   Florencia Reasons, MD  guaiFENesin (MUCINEX) 600 MG 12 hr tablet Take 2 tablets  (1,200 mg total) by mouth 2 (two) times daily. Patient not taking: Reported on 06/20/2015 05/23/15   Silver Huguenin Elgergawy, MD  isosorbide-hydrALAZINE (BIDIL) 20-37.5 MG tablet Take 1.5 tablets by mouth 2 (two) times daily. Patient not taking: Reported on 06/20/2015 03/11/15   Barton Dubois, MD  pantoprazole (PROTONIX) 40 MG tablet Take 1 tablet (40 mg total) by mouth daily. Patient not taking: Reported on 07/04/2015 05/23/15   Silver Huguenin Elgergawy, MD  predniSONE (DELTASONE) 10 MG tablet Please take 40 mg oral daily for 3 days, then 30 mg oral daily for 2 days, then 20 mg oral daily for 2 days, then 10 mg oral for 2 days, then stop Patient not taking: Reported on 06/20/2015 05/23/15   Silver Huguenin Elgergawy, MD   BP 168/83 mmHg  Pulse 77  Temp(Src) 102.8 F (39.3 C) (Oral)  Resp 22  Ht 5' 9.5" (1.765 m)  Wt 384 lb (174.181 kg)  BMI 55.91 kg/m2  SpO2 97% Physical Exam  Constitutional: He is oriented to person, place, and time.  Morbid obese. He is nontoxic and alert. Mild increased work of breathing at rest. Harsh paroxysmal cough  HENT:  Head: Normocephalic and atraumatic.  Mouth/Throat: Oropharynx is clear and moist.  Eyes: EOM are normal. Pupils are equal, round, and reactive to light.  Cardiovascular: Normal rate, regular rhythm, normal heart sounds and intact distal pulses.   Pulmonary/Chest:  Mild increased work of breathing. Bilateral expiratory wheeze.  Abdominal: Soft. There is no tenderness.  Morbid obesity.  Musculoskeletal:  1-2+ pitting edema bilateral lower extremities.  Neurological: He is alert and oriented to person, place, and time. Coordination normal.  Skin: Skin is warm and dry.  Psychiatric: He has a normal mood and affect.    ED Course  Procedures (including critical care time) Labs Review Labs Reviewed  BASIC METABOLIC PANEL - Abnormal; Notable for the following:    Potassium 3.1 (*)    CO2 33 (*)    Glucose, Bld 147 (*)    Creatinine, Ser 1.95 (*)    GFR calc non  Af Amer 38 (*)    GFR calc Af Amer 44 (*)    All other components within normal limits  BRAIN NATRIURETIC PEPTIDE - Abnormal; Notable for the following:    B Natriuretic Peptide 321.1 (*)    All other components within normal limits  TROPONIN I - Abnormal; Notable for the following:    Troponin I 0.05 (*)    All other components within normal limits  CBC WITH DIFFERENTIAL/PLATELET - Abnormal; Notable for the following:    Lymphs Abs 0.4 (*)    All other components within normal limits  CULTURE, BLOOD (ROUTINE X 2)  CULTURE, BLOOD (ROUTINE  X 2)  URINE CULTURE  INFLUENZA PANEL BY PCR (TYPE A & B, H1N1)  URINALYSIS, ROUTINE W REFLEX MICROSCOPIC (NOT AT [facility])  I-STAT CG4 LACTIC ACID, ED    Imaging Review Dg Chest 2 View  07/04/2015  CLINICAL DATA:  Cough and fever with chest tightness since 1 a.m. this morning EXAM: CHEST  2 VIEW COMPARISON:  06/20/2015 FINDINGS: Heart is borderline in size. Lungs are clear. No effusions. No acute bony abnormality. IMPRESSION: No active cardiopulmonary disease. Electronically Signed   By: Rolm Baptise M.D.   On: 07/04/2015 20:49   I have personally reviewed and evaluated these images and lab results as part of my medical decision-making.   EKG Interpretation   Date/Time:  Wednesday July 04 2015 20:07:27 EDT Ventricular Rate:  79 PR Interval:  162 QRS Duration: 97 QT Interval:  390 QTC Calculation: 447 R Axis:   -29 Text Interpretation:  Age not entered, assumed to be  55 years old for  purpose of ECG interpretation Sinus rhythm Borderline left axis deviation  Abnormal R-wave progression, late transition Abnormal T, consider  ischemia, lateral leads no sig change from previous Confirmed by Johnney Killian,  MD, Jeannie Done [id number]) on 07/04/2015 8:21:53 PM     Consult: Dr. Olevia Bowens for admission. MDM   Final diagnoses:  HCAP (healthcare-associated pneumonia)  Hypoventilation associated with obesity ([place])   Patient presents with cough for 2 days now. He  reports increasing shortness of breath and harsh cough episodes. He does have active wheezing. Temperature has spiked to 102.8. At this time patient will be admitted for ongoing treatment. He has had recent hospitalization within the less than 2 weeks. As his symptoms are respiratory, he will be treated for healthcare associated pneumonia.    Charlesetta Shanks, MD 07/04/15 [phone]

## 2015-07-05 DIAGNOSIS — I10 Essential (primary) hypertension: Secondary | ICD-10-CM

## 2015-07-05 DIAGNOSIS — J1100 Influenza due to unidentified influenza virus with unspecified type of pneumonia: Secondary | ICD-10-CM

## 2015-07-05 LAB — COMPREHENSIVE METABOLIC PANEL
ALBUMIN: 4.1 g/dL (ref 3.5–5.0)
ALT: 14 U/L — AB (ref 17–63)
AST: 23 U/L (ref 15–41)
Alkaline Phosphatase: 65 U/L (ref 38–126)
Anion gap: 12 (ref 5–15)
BUN: 18 mg/dL (ref 6–20)
CHLORIDE: 100 mmol/L — AB (ref 101–111)
CO2: 32 mmol/L (ref 22–32)
CREATININE: 1.7 mg/dL — AB (ref 0.61–1.24)
Calcium: 9.1 mg/dL (ref 8.9–10.3)
GFR calc Af Amer: 52 mL/min — ABNORMAL LOW (ref >60)
GFR calc non Af Amer: 45 mL/min — ABNORMAL LOW (ref >60)
GLUCOSE: 142 mg/dL — AB (ref 65–99)
POTASSIUM: 3.5 mmol/L (ref 3.5–5.1)
SODIUM: 144 mmol/L (ref 135–145)
Total Bilirubin: 0.7 mg/dL (ref 0.3–1.2)
Total Protein: 6.9 g/dL (ref 6.5–8.1)

## 2015-07-05 LAB — CBC WITH DIFFERENTIAL/PLATELET
Basophils Absolute: 0 10*3/uL (ref 0.0–0.1)
Basophils Relative: 0 %
EOS ABS: 0 10*3/uL (ref 0.0–0.7)
EOS PCT: 0 %
HCT: 39 % (ref 39.0–52.0)
Hemoglobin: 12.3 g/dL — ABNORMAL LOW (ref 13.0–17.0)
LYMPHS ABS: 0.5 10*3/uL — AB (ref 0.7–4.0)
LYMPHS PCT: 10 %
MCH: 26.7 pg (ref 26.0–34.0)
MCHC: 31.5 g/dL (ref 30.0–36.0)
MCV: 84.6 fL (ref 78.0–100.0)
MONOS PCT: 2 %
Monocytes Absolute: 0.1 10*3/uL (ref 0.1–1.0)
Neutro Abs: 4.5 10*3/uL (ref 1.7–7.7)
Neutrophils Relative %: 88 %
PLATELETS: 147 10*3/uL — AB (ref 150–400)
RBC: 4.61 MIL/uL (ref 4.22–5.81)
RDW: 14 % (ref 11.5–15.5)
WBC: 5.1 10*3/uL (ref 4.0–10.5)

## 2015-07-05 LAB — URINALYSIS, ROUTINE W REFLEX MICROSCOPIC
BILIRUBIN URINE: NEGATIVE
Glucose, UA: NEGATIVE mg/dL
HGB URINE DIPSTICK: NEGATIVE
KETONES UR: NEGATIVE mg/dL
Leukocytes, UA: NEGATIVE
NITRITE: NEGATIVE
PROTEIN: NEGATIVE mg/dL
Specific Gravity, Urine: 1.017 (ref 1.005–1.030)
pH: 5.5 (ref 5.0–8.0)

## 2015-07-05 LAB — INFLUENZA PANEL BY PCR (TYPE A & B)
H1N1FLUPCR: NOT DETECTED
INFLAPCR: NEGATIVE
Influenza B By PCR: POSITIVE — AB

## 2015-07-05 LAB — TROPONIN I
TROPONIN I: 0.05 ng/mL — AB (ref <0.031)
Troponin I: 0.05 ng/mL — ABNORMAL HIGH (ref <0.031)

## 2015-07-05 LAB — STREP PNEUMONIAE URINARY ANTIGEN: STREP PNEUMO URINARY ANTIGEN: NEGATIVE

## 2015-07-05 MED ORDER — DILTIAZEM HCL ER COATED BEADS 120 MG PO CP24
120.0000 mg | ORAL_CAPSULE | Freq: Every day | ORAL | Status: DC
  Administered 2015-07-05 – 2015-07-08 (×4): 120 mg via ORAL
  Filled 2015-07-05 (×4): qty 1

## 2015-07-05 MED ORDER — CEFEPIME HCL 2 G IJ SOLR
2.0000 g | Freq: Three times a day (TID) | INTRAMUSCULAR | Status: DC
  Administered 2015-07-05: 2 g via INTRAVENOUS
  Filled 2015-07-05 (×2): qty 2

## 2015-07-05 MED ORDER — HYDRALAZINE HCL 50 MG PO TABS
50.0000 mg | ORAL_TABLET | Freq: Two times a day (BID) | ORAL | Status: DC
  Administered 2015-07-05 – 2015-07-08 (×7): 50 mg via ORAL
  Filled 2015-07-05 (×7): qty 1

## 2015-07-05 MED ORDER — ALBUTEROL SULFATE (2.5 MG/3ML) 0.083% IN NEBU
2.5000 mg | INHALATION_SOLUTION | RESPIRATORY_TRACT | Status: DC | PRN
  Administered 2015-07-05 – 2015-07-06 (×4): 2.5 mg via RESPIRATORY_TRACT
  Filled 2015-07-05 (×4): qty 3

## 2015-07-05 MED ORDER — POTASSIUM CHLORIDE CRYS ER 20 MEQ PO TBCR
40.0000 meq | EXTENDED_RELEASE_TABLET | Freq: Once | ORAL | Status: AC
  Administered 2015-07-05: 40 meq via ORAL
  Filled 2015-07-05: qty 2

## 2015-07-05 MED ORDER — CLONIDINE HCL 0.1 MG PO TABS
0.1000 mg | ORAL_TABLET | Freq: Three times a day (TID) | ORAL | Status: DC
  Administered 2015-07-05 – 2015-07-08 (×9): 0.1 mg via ORAL
  Filled 2015-07-05 (×10): qty 1

## 2015-07-05 MED ORDER — CLONIDINE HCL 0.1 MG PO TABS: 0.1000 mg | ORAL_TABLET | Freq: Two times a day (BID) | ORAL | Status: DC

## 2015-07-05 MED ORDER — CARVEDILOL 12.5 MG PO TABS: 12.5000 mg | ORAL_TABLET | Freq: Two times a day (BID) | ORAL | Status: DC

## 2015-07-05 MED ORDER — POTASSIUM CHLORIDE ER 10 MEQ PO TBCR
20.0000 meq | EXTENDED_RELEASE_TABLET | Freq: Every day | ORAL | Status: DC
  Administered 2015-07-05 – 2015-07-08 (×4): 20 meq via ORAL
  Filled 2015-07-05 (×6): qty 2

## 2015-07-05 MED ORDER — VANCOMYCIN HCL 10 G IV SOLR: 1750.0000 mg | INTRAVENOUS | Status: DC

## 2015-07-05 MED ORDER — CLONIDINE HCL 0.1 MG PO TABS
0.1000 mg | ORAL_TABLET | Freq: Every day | ORAL | Status: DC
  Administered 2015-07-05: 0.1 mg via ORAL
  Filled 2015-07-05: qty 1

## 2015-07-05 MED ORDER — ALBUTEROL SULFATE (2.5 MG/3ML) 0.083% IN NEBU
2.5000 mg | INHALATION_SOLUTION | Freq: Three times a day (TID) | RESPIRATORY_TRACT | Status: DC
  Administered 2015-07-06 – 2015-07-08 (×7): 2.5 mg via RESPIRATORY_TRACT
  Filled 2015-07-05 (×7): qty 3

## 2015-07-05 MED ORDER — IBUPROFEN 200 MG PO TABS
200.0000 mg | ORAL_TABLET | Freq: Four times a day (QID) | ORAL | Status: DC | PRN
  Administered 2015-07-08: 200 mg via ORAL
  Filled 2015-07-05: qty 1

## 2015-07-05 MED ORDER — ENOXAPARIN SODIUM 80 MG/0.8ML ~~LOC~~ SOLN
80.0000 mg | Freq: Every day | SUBCUTANEOUS | Status: DC
  Administered 2015-07-05 – 2015-07-07 (×4): 80 mg via SUBCUTANEOUS
  Filled 2015-07-05 (×4): qty 0.8

## 2015-07-05 MED ORDER — MONTELUKAST SODIUM 10 MG PO TABS
10.0000 mg | ORAL_TABLET | Freq: Every day | ORAL | Status: DC
  Administered 2015-07-05 – 2015-07-07 (×4): 10 mg via ORAL
  Filled 2015-07-05 (×4): qty 1

## 2015-07-05 MED ORDER — POTASSIUM CHLORIDE ER 10 MEQ PO TBCR
10.0000 meq | EXTENDED_RELEASE_TABLET | Freq: Every day | ORAL | Status: DC
  Filled 2015-07-05: qty 1

## 2015-07-05 MED ORDER — DEXTROMETHORPHAN POLISTIREX ER 30 MG/5ML PO SUER
15.0000 mg | Freq: Every day | ORAL | Status: DC | PRN
  Administered 2015-07-06 – 2015-07-08 (×3): 15 mg via ORAL
  Filled 2015-07-05 (×7): qty 5

## 2015-07-05 MED ORDER — TORSEMIDE 20 MG PO TABS
40.0000 mg | ORAL_TABLET | Freq: Every day | ORAL | Status: DC
  Administered 2015-07-05 – 2015-07-08 (×4): 40 mg via ORAL
  Filled 2015-07-05 (×4): qty 2

## 2015-07-05 MED ORDER — COLCHICINE 0.6 MG PO TABS
0.6000 mg | ORAL_TABLET | Freq: Every day | ORAL | Status: DC | PRN
  Filled 2015-07-05: qty 1

## 2015-07-05 MED ORDER — OSELTAMIVIR PHOSPHATE 75 MG PO CAPS
75.0000 mg | ORAL_CAPSULE | Freq: Two times a day (BID) | ORAL | Status: DC
  Administered 2015-07-05 – 2015-07-08 (×8): 75 mg via ORAL
  Filled 2015-07-05 (×8): qty 1

## 2015-07-05 NOTE — ED Notes (Signed)
Report attempted 

## 2015-07-05 NOTE — Progress Notes (Signed)
Pharmacy Antibiotic Note  Kirk Zajdel is a 55 y.o. male admitted on 07/04/2015 with pneumonia.  Pharmacy has been consulted for Vancomycin, cefepime  dosing.  Plan: Vancomycin 1750mg  IV every 24 hours.  Goal trough 15-20 mcg/mL.  Cefepime 2gm iv q8hr  Height: 5\' 10"  (177.8 cm) Weight: (!) 386 lb 0.4 oz (175.1 kg) IBW/kg (Calculated) : 73  Temp (24hrs), Avg:100.5 F (38.1 C), Min:98.9 F (37.2 C), Max:102.8 F (39.3 C)   Recent Labs Lab 07/04/15 2109 07/04/15 2215  WBC 5.6  --   CREATININE 1.95*  --   LATICACIDVEN  --  1.62    Estimated Creatinine Clearance: 71.3 mL/min (by C-G formula based on Cr of 1.95).    No Known Allergies  Antimicrobials this admission: Cefepime 4/12 >>  Vanc 4/12 >>    Microbiology results: 4/12 BCx:  4/12 Influenza panel:   Thank you for allowing pharmacy to be a part of this patient's care.  Nani Skillern Crowford 07/05/2015 12:57 AM

## 2015-07-05 NOTE — Progress Notes (Signed)
Lovenox per Pharmacy for DVT Prophylaxis    Pharmacy has been consulted from dosing enoxaparin (lovenox) in this patient for DVT prophylaxis.  The pharmacist has reviewed pertinent labs (Hgb _13__; PLT_150__), patient weight (_174__kg) and renal function (CrCl__70_mL/min) and decided that enoxaparin _80_mg SQ Q_24_Hrs is appropriate for this patient.  The pharmacy department will sign off at this time.  Please reconsult pharmacy if status changes or for further issues.  Thank you  Cyndia Diver PharmD, BCPS  07/05/2015, 12:53 AM

## 2015-07-05 NOTE — Progress Notes (Signed)
[facility] PROGRESS NOTE  Phillip [name] [id number] DOB: 1960/10/27 DOA: 07/04/2015 PCP: [name], MD  Assessment/Plan: 1. Influenza B. -Phillip [name] was admitted overnight when he presented with complaints of fevers chills associate with shortness of breath and pleuritic type chest pain. -Overnight had a MAXIMUM TEMPERATURE of 102.8. -Chest x-ray did not reveal acute infiltrate, discontinued IV vancomycin and cefepime -Continue supportive care -Anticipate discharge in the next 24 hours if he remains stable  2.  Hypertension -Blood pressures are elevated, this afternoon having systolic blood pressures in the 170s, will increase his clonidine to 0.1 mg by mouth 3 times a day meanwhile continue Coreg 12.5 mg by mouth twice a day and hydralazine 50 mg by mouth daily.   Code Status: Full code Family Communication: Family not present Disposition Plan: Anticipate discharge in the next 24 hours   Antibiotics:  Cefepime discontinued on 07/05/2015  Ankle mycins discontinued on 07/05/2015  HPI/Subjective: Phillip [name] is a pleasant 55 year old gentleman with a past medical history of hypertension, morbid obesity who was admitted to the medicine service on 07/04/2015 when he presented with complaints of cough, fevers, chills and pleuritic type chest pain. Chest x-ray did not reveal acute cardiopulmonary disease. Flu swab came back positive for influenza B. He was started on Tamiflu.   Objective: Filed Vitals:   07/05/15 0049 07/05/15 0638  BP: 145/77 167/80  Pulse: 75 76  Temp: 99.6 F (37.6 C) 99.2 F (37.3 C)  Resp: 22 20    Intake/Output Summary (Last 24 hours) at 07/05/15 1418 Last data filed at 07/05/15 1247  Gross per 24 hour  Intake    530 ml  Output   1600 ml  Net  -1070 ml   Filed Weights   07/04/15 2015 07/05/15 0049  Weight: 174.181 kg (384 lb) 175.1 kg (386 lb 0.4 oz)    Exam:   General:  Nontoxic appearing, awake and alert oriented  Cardiovascular:  Regular rate and rhythm normal S1-S2 no murmurs or gallops  Respiratory: Lungs overall clear to auscultation bilaterally no wheezing rhonchi or rales  Abdomen: Soft nontender nondistended  Musculoskeletal: no edema  Data Reviewed: Basic Metabolic Panel:  Recent Labs Lab 07/04/15 2109 07/05/15 0509  NA 142 144  K 3.1* 3.5  CL 101 100*  CO2 33* 32  GLUCOSE 147* 142*  BUN 18 18  CREATININE 1.95* 1.70*  CALCIUM 9.2 9.1   Liver Function Tests:  Recent Labs Lab 07/05/15 0509  AST 23  ALT 14*  ALKPHOS 65  BILITOT 0.7  PROT 6.9  ALBUMIN 4.1   No results for input(s): LIPASE, AMYLASE in the last 168 hours. No results for input(s): AMMONIA in the last 168 hours. CBC:  Recent Labs Lab 07/04/15 2109 07/05/15 0509  WBC 5.6 5.1  NEUTROABS 4.5 4.5  HGB 13.0 12.3*  HCT 41.0 39.0  MCV 85.2 84.6  PLT 150 147*   Cardiac Enzymes:  Recent Labs Lab 07/04/15 2109 07/05/15 0509 07/05/15 1113  TROPONINI 0.05* 0.05* 0.05*   BNP (last 3 results)  Recent Labs  05/18/15 2208 06/20/15 1825 07/04/15 2109  BNP 429.7* 394.0* 321.1*    ProBNP (last 3 results) No results for input(s): PROBNP in the last 8760 hours.  CBG: No results for input(s): GLUCAP in the last 168 hours.  No results found for this or any previous visit (from the past 240 hour(s)).   Studies: Dg Chest 2 View  07/04/2015  CLINICAL DATA:  Cough and fever with chest tightness  since 1 a.m. this morning EXAM: CHEST  2 VIEW COMPARISON:  06/20/2015 FINDINGS: Heart is borderline in size. Lungs are clear. No effusions. No acute bony abnormality. IMPRESSION: No active cardiopulmonary disease. Electronically Signed   By: Rolm Baptise M.D.   On: 07/04/2015 20:49    Scheduled Meds: . carvedilol  12.5 mg Oral BID WC  . cloNIDine  0.1 mg Oral BID  . diltiazem  120 mg Oral Daily  . enoxaparin (LOVENOX) injection  80 mg Subcutaneous QHS  . hydrALAZINE  50 mg Oral BID  . montelukast  10 mg Oral QHS  .  oseltamivir  75 mg Oral BID  . potassium chloride  20 mEq Oral Daily  . torsemide  40 mg Oral Daily   Continuous Infusions:   Principal Problem:   HCAP (healthcare-associated pneumonia) Active Problems:   OSA on CPAP   Hypokalemia   Essential hypertension   Elevated troponin    Time spent: 25 min    Kelvin Cellar  [facility] Pager [phone]. If 7PM-7AM, please contact night-coverage at [email or web address], password [facility] 07/05/2015, 2:18 PM  LOS: 1 day

## 2015-07-06 ENCOUNTER — Inpatient Hospital Stay (HOSPITAL_COMMUNITY): Payer: Managed Care, Other (non HMO)

## 2015-07-06 LAB — CBC WITH DIFFERENTIAL/PLATELET
BASOS PCT: 0 %
Basophils Absolute: 0 10*3/uL (ref 0.0–0.1)
EOS ABS: 0 10*3/uL (ref 0.0–0.7)
EOS PCT: 0 %
HCT: 38.2 % — ABNORMAL LOW (ref 39.0–52.0)
HEMOGLOBIN: 12 g/dL — AB (ref 13.0–17.0)
LYMPHS ABS: 1.3 10*3/uL (ref 0.7–4.0)
Lymphocytes Relative: 30 %
MCH: 26.7 pg (ref 26.0–34.0)
MCHC: 31.4 g/dL (ref 30.0–36.0)
MCV: 85.1 fL (ref 78.0–100.0)
MONOS PCT: 19 %
Monocytes Absolute: 0.8 10*3/uL (ref 0.1–1.0)
NEUTROS PCT: 51 %
Neutro Abs: 2.3 10*3/uL (ref 1.7–7.7)
PLATELETS: 124 10*3/uL — AB (ref 150–400)
RBC: 4.49 MIL/uL (ref 4.22–5.81)
RDW: 14 % (ref 11.5–15.5)
WBC: 4.4 10*3/uL (ref 4.0–10.5)

## 2015-07-06 LAB — COMPREHENSIVE METABOLIC PANEL
ALK PHOS: 55 U/L (ref 38–126)
ALT: 17 U/L (ref 17–63)
AST: 31 U/L (ref 15–41)
Albumin: 3.6 g/dL (ref 3.5–5.0)
Anion gap: 10 (ref 5–15)
BUN: 21 mg/dL — AB (ref 6–20)
CALCIUM: 8.6 mg/dL — AB (ref 8.9–10.3)
CHLORIDE: 98 mmol/L — AB (ref 101–111)
CO2: 33 mmol/L — AB (ref 22–32)
CREATININE: 1.89 mg/dL — AB (ref 0.61–1.24)
GFR, EST AFRICAN AMERICAN: 45 mL/min — AB (ref >60)
GFR, EST NON AFRICAN AMERICAN: 39 mL/min — AB (ref >60)
Glucose, Bld: 104 mg/dL — ABNORMAL HIGH (ref 65–99)
Potassium: 3.3 mmol/L — ABNORMAL LOW (ref 3.5–5.1)
Sodium: 141 mmol/L (ref 135–145)
Total Bilirubin: 0.6 mg/dL (ref 0.3–1.2)
Total Protein: 6.2 g/dL — ABNORMAL LOW (ref 6.5–8.1)

## 2015-07-06 LAB — URINE CULTURE: Culture: NO GROWTH

## 2015-07-06 MED ORDER — POTASSIUM CHLORIDE CRYS ER 20 MEQ PO TBCR
40.0000 meq | EXTENDED_RELEASE_TABLET | Freq: Once | ORAL | Status: AC
  Administered 2015-07-06: 40 meq via ORAL
  Filled 2015-07-06: qty 2

## 2015-07-06 MED ORDER — GUAIFENESIN ER 600 MG PO TB12
1200.0000 mg | ORAL_TABLET | Freq: Two times a day (BID) | ORAL | Status: DC
  Administered 2015-07-06 – 2015-07-08 (×5): 1200 mg via ORAL
  Filled 2015-07-06 (×7): qty 2

## 2015-07-06 NOTE — Progress Notes (Signed)
[facility] PROGRESS NOTE  Phillip [name] [id number] DOB: June 08, 1960 DOA: 07/04/2015 PCP: [name], MD  Assessment/Plan: 1. Influenza B. -[name] was admitted overnight when he presented with complaints of fevers chills associate with shortness of breath and pleuritic type chest pain. -Overnight had a MAXIMUM TEMPERATURE of 100.4 -Chest x-ray did not reveal acute infiltrate, discontinued IV vancomycin and cefepime -Continue supportive care -07/06/2015 [name] reporting feeling worse, had MAXIMUM TEMPERATURE of 100.4 overnight. -Will repeat chest x-ray to assure stability today  2.  Hypertension -Blood pressures are elevated, this afternoon having systolic blood pressures in the 170s, increase his clonidine to 0.1 mg by mouth 3 times a day meanwhile continue Coreg 12.5 mg by mouth twice a day and hydralazine 50 mg by mouth daily.   Code Status: Full code Family Communication: Family not present Disposition Plan: Anticipate discharge in the next 24 hours   Antibiotics:  Cefepime discontinued on 07/05/2015  Vancomycin discontinued on 07/05/2015  HPI/Subjective: [name] is a pleasant 55 year old gentleman with a past medical history of hypertension, morbid obesity who was admitted to the medicine service on 07/04/2015 when he presented with complaints of cough, fevers, chills and pleuritic type chest pain. Chest x-ray did not reveal acute cardiopulmonary disease. Flu swab came back positive for influenza B. He was started on Tamiflu.   Objective: Filed Vitals:   07/06/15 1128 07/06/15 1537  BP: 194/88 143/64  Pulse:  60  Temp: 100.4 F (38 C) 99.5 F (37.5 C)  Resp:  18    Intake/Output Summary (Last 24 hours) at 07/06/15 1601 Last data filed at 07/06/15 1539  Gross per 24 hour  Intake    480 ml  Output   3475 ml  Net  -2995 ml   Filed Weights   07/04/15 2015 07/05/15 0049  Weight: 174.181 kg (384 lb) 175.1 kg (386 lb 0.4 oz)    Exam:   General:  Patient reports really worse today, appears ill  Cardiovascular: Regular rate and rhythm normal S1-S2 no murmurs or gallops  Respiratory: Lungs overall clear to auscultation bilaterally no wheezing rhonchi or rales  Abdomen: Soft nontender nondistended  Musculoskeletal: no edema  Data Reviewed: Basic Metabolic Panel:  Recent Labs Lab 07/04/15 2109 07/05/15 0509 07/06/15 0507  NA 142 144 141  K 3.1* 3.5 3.3*  CL 101 100* 98*  CO2 33* 32 33*  GLUCOSE 147* 142* 104*  BUN 18 18 21*  CREATININE 1.95* 1.70* 1.89*  CALCIUM 9.2 9.1 8.6*   Liver Function Tests:  Recent Labs Lab 07/05/15 0509 07/06/15 0507  AST 23 31  ALT 14* 17  ALKPHOS 65 55  BILITOT 0.7 0.6  PROT 6.9 6.2*  ALBUMIN 4.1 3.6   No results for input(s): LIPASE, AMYLASE in the last 168 hours. No results for input(s): AMMONIA in the last 168 hours. CBC:  Recent Labs Lab 07/04/15 2109 07/05/15 0509 07/06/15 0507  WBC 5.6 5.1 4.4  NEUTROABS 4.5 4.5 2.3  HGB 13.0 12.3* 12.0*  HCT 41.0 39.0 38.2*  MCV 85.2 84.6 85.1  PLT 150 147* 124*   Cardiac Enzymes:  Recent Labs Lab 07/04/15 2109 07/05/15 0509 07/05/15 1113  TROPONINI 0.05* 0.05* 0.05*   BNP (last 3 results)  Recent Labs  05/18/15 2208 06/20/15 1825 07/04/15 2109  BNP 429.7* 394.0* 321.1*    ProBNP (last 3 results) No results for input(s): PROBNP in the last 8760 hours.  CBG: No results for input(s): GLUCAP in the last 168 hours.  Recent Results (from the past 240 hour(s))  Culture, blood (routine x 2)     Status: None (Preliminary result)   Collection Time: 07/04/15 10:04 PM  Result Value Ref Range Status   Specimen Description BLOOD RIGHT ANTECUBITAL  Final   Special Requests BOTTLES DRAWN AEROBIC AND ANAEROBIC 5ML  Final   Culture   Final    NO GROWTH 1 DAY Performed at [facility]    Report Status PENDING  Incomplete  Culture, blood (routine x 2)     Status: None (Preliminary result)   Collection Time:  07/04/15 10:10 PM  Result Value Ref Range Status   Specimen Description BLOOD RIGHT ANTECUBITAL  Final   Special Requests BOTTLES DRAWN AEROBIC AND ANAEROBIC 5CC  Final   Culture   Final    NO GROWTH 1 DAY Performed at [facility]    Report Status PENDING  Incomplete  Urine culture     Status: None   Collection Time: 07/04/15 10:22 PM  Result Value Ref Range Status   Specimen Description URINE, CLEAN CATCH  Final   Special Requests NONE  Final   Culture   Final    NO GROWTH 1 DAY Performed at [facility]    Report Status 07/06/2015 FINAL  Final     Studies: Dg Chest 2 View  07/04/2015  CLINICAL DATA:  Cough and fever with chest tightness since 1 a.m. this morning EXAM: CHEST  2 VIEW COMPARISON:  06/20/2015 FINDINGS: Heart is borderline in size. Lungs are clear. No effusions. No acute bony abnormality. IMPRESSION: No active cardiopulmonary disease. Electronically Signed   By: Rolm Baptise M.D.   On: 07/04/2015 20:49    Scheduled Meds: . albuterol  2.5 mg Nebulization TID  . carvedilol  12.5 mg Oral BID WC  . cloNIDine  0.1 mg Oral TID  . diltiazem  120 mg Oral Daily  . enoxaparin (LOVENOX) injection  80 mg Subcutaneous QHS  . guaiFENesin  1,200 mg Oral BID  . hydrALAZINE  50 mg Oral BID  . montelukast  10 mg Oral QHS  . oseltamivir  75 mg Oral BID  . potassium chloride  20 mEq Oral Daily  . potassium chloride  40 mEq Oral Once  . torsemide  40 mg Oral Daily   Continuous Infusions:   Principal Problem:   Influenza with pneumonia Active Problems:   OSA on CPAP   Hypokalemia   Essential hypertension   Elevated troponin    Time spent: 25 min    Kelvin Cellar  [facility] Pager [phone]. If 7PM-7AM, please contact night-coverage at [email or web address], password [facility] 07/06/2015, 4:01 PM  LOS: 2 days

## 2015-07-07 LAB — CBC WITH DIFFERENTIAL/PLATELET
BASOS ABS: 0 10*3/uL (ref 0.0–0.1)
BASOS PCT: 0 %
EOS ABS: 0 10*3/uL (ref 0.0–0.7)
Eosinophils Relative: 0 %
HEMATOCRIT: 40 % (ref 39.0–52.0)
HEMOGLOBIN: 12.5 g/dL — AB (ref 13.0–17.0)
Lymphocytes Relative: 48 %
Lymphs Abs: 1.6 10*3/uL (ref 0.7–4.0)
MCH: 26.8 pg (ref 26.0–34.0)
MCHC: 31.3 g/dL (ref 30.0–36.0)
MCV: 85.8 fL (ref 78.0–100.0)
Monocytes Absolute: 0.5 10*3/uL (ref 0.1–1.0)
Monocytes Relative: 13 %
NEUTROS ABS: 1.3 10*3/uL — AB (ref 1.7–7.7)
NEUTROS PCT: 39 %
Platelets: 135 10*3/uL — ABNORMAL LOW (ref 150–400)
RBC: 4.66 MIL/uL (ref 4.22–5.81)
RDW: 14 % (ref 11.5–15.5)
WBC: 3.4 10*3/uL — AB (ref 4.0–10.5)

## 2015-07-07 LAB — COMPREHENSIVE METABOLIC PANEL
ALK PHOS: 50 U/L (ref 38–126)
ALT: 21 U/L (ref 17–63)
ANION GAP: 11 (ref 5–15)
AST: 43 U/L — ABNORMAL HIGH (ref 15–41)
Albumin: 3.7 g/dL (ref 3.5–5.0)
BILIRUBIN TOTAL: 0.7 mg/dL (ref 0.3–1.2)
BUN: 24 mg/dL — ABNORMAL HIGH (ref 6–20)
CALCIUM: 8.7 mg/dL — AB (ref 8.9–10.3)
CO2: 35 mmol/L — ABNORMAL HIGH (ref 22–32)
CREATININE: 1.9 mg/dL — AB (ref 0.61–1.24)
Chloride: 98 mmol/L — ABNORMAL LOW (ref 101–111)
GFR, EST AFRICAN AMERICAN: 45 mL/min — AB (ref >60)
GFR, EST NON AFRICAN AMERICAN: 39 mL/min — AB (ref >60)
Glucose, Bld: 104 mg/dL — ABNORMAL HIGH (ref 65–99)
Potassium: 3.4 mmol/L — ABNORMAL LOW (ref 3.5–5.1)
SODIUM: 144 mmol/L (ref 135–145)
TOTAL PROTEIN: 6.5 g/dL (ref 6.5–8.1)

## 2015-07-07 LAB — LEGIONELLA PNEUMOPHILA SEROGP 1 UR AG: L. pneumophila Serogp 1 Ur Ag: NEGATIVE

## 2015-07-07 NOTE — Progress Notes (Signed)
[facility] PROGRESS NOTE  Phillip [name] [id number] DOB: September 20, 1960 DOA: 07/04/2015 PCP: Phillip Noble, MD  Assessment/Plan: 1. Influenza B. -Phillip [name] was admitted overnight when he presented with complaints of fevers chills associate with shortness of breath and pleuritic type chest pain. -Overnight had a MAXIMUM TEMPERATURE of 100.4 -Chest x-ray did not reveal acute infiltrate, discontinued IV vancomycin and cefepime -Continue supportive care -07/06/2015 Phillip. [name] reporting feeling worse, had MAXIMUM TEMPERATURE of 100.4 overnight. -Repeat chest x-ray on 07/06/2015 showing no evidence of edema or consolidation -Patient showing slow improvement  2.  Hypertension -Blood pressures are elevated, this afternoon having systolic blood pressures in the 170s, increase his clonidine to 0.1 mg by mouth 3 times a day meanwhile continue Coreg 12.5 mg by mouth twice a day and hydralazine 50 mg by mouth daily.   Code Status: Full code Family Communication: Family not present Disposition Plan: Anticipate discharge in the next 24 hours   Antibiotics:  Cefepime discontinued on 07/05/2015  Vancomycin discontinued on 07/05/2015  HPI/Subjective: Phillip [name] is a pleasant 55 year old gentleman with a past medical history of hypertension, morbid obesity who was admitted to the medicine service on 07/04/2015 when he presented with complaints of cough, fevers, chills and pleuritic type chest pain. Chest x-ray did not reveal acute cardiopulmonary disease. Flu swab came back positive for influenza B. He was started on Tamiflu.   Objective: Filed Vitals:   07/07/15 0511 07/07/15 0956  BP: 139/70 142/75  Pulse: 66   Temp: 99.4 F (37.4 C)   Resp: 22     Intake/Output Summary (Last 24 hours) at 07/07/15 1522 Last data filed at 07/07/15 0512  Gross per 24 hour  Intake    240 ml  Output   1950 ml  Net  -1710 ml   Filed Weights   07/04/15 2015 07/05/15 0049 07/07/15 0954  Weight: 174.181 kg  (384 lb) 175.1 kg (386 lb 0.4 oz) 173.1 kg (381 lb 9.9 oz)    Exam:   General: Seems little better compared to yesterday, still ill-appearing  Cardiovascular: Regular rate and rhythm normal S1-S2 no murmurs or gallops  Respiratory: Lungs overall clear to auscultation bilaterally no wheezing rhonchi or rales  Abdomen: Soft nontender nondistended  Musculoskeletal: no edema  Data Reviewed: Basic Metabolic Panel:  Recent Labs Lab 07/04/15 2109 07/05/15 0509 07/06/15 0507 07/07/15 0503  NA 142 144 141 144  K 3.1* 3.5 3.3* 3.4*  CL 101 100* 98* 98*  CO2 33* 32 33* 35*  GLUCOSE 147* 142* 104* 104*  BUN 18 18 21* 24*  CREATININE 1.95* 1.70* 1.89* 1.90*  CALCIUM 9.2 9.1 8.6* 8.7*   Liver Function Tests:  Recent Labs Lab 07/05/15 0509 07/06/15 0507 07/07/15 0503  AST 23 31 43*  ALT 14* 17 21  ALKPHOS 65 55 50  BILITOT 0.7 0.6 0.7  PROT 6.9 6.2* 6.5  ALBUMIN 4.1 3.6 3.7   No results for input(s): LIPASE, AMYLASE in the last 168 hours. No results for input(s): AMMONIA in the last 168 hours. CBC:  Recent Labs Lab 07/04/15 2109 07/05/15 0509 07/06/15 0507 07/07/15 0503  WBC 5.6 5.1 4.4 3.4*  NEUTROABS 4.5 4.5 2.3 1.3*  HGB 13.0 12.3* 12.0* 12.5*  HCT 41.0 39.0 38.2* 40.0  MCV 85.2 84.6 85.1 85.8  PLT 150 147* 124* 135*   Cardiac Enzymes:  Recent Labs Lab 07/04/15 2109 07/05/15 0509 07/05/15 1113  TROPONINI 0.05* 0.05* 0.05*   BNP (last 3 results)  Recent Labs  05/18/15  2208 06/20/15 1825 07/04/15 2109  BNP 429.7* 394.0* 321.1*    ProBNP (last 3 results) No results for input(s): PROBNP in the last 8760 hours.  CBG: No results for input(s): GLUCAP in the last 168 hours.  Recent Results (from the past 240 hour(s))  Culture, blood (routine x 2)     Status: None (Preliminary result)   Collection Time: 07/04/15 10:04 PM  Result Value Ref Range Status   Specimen Description BLOOD RIGHT ANTECUBITAL  Final   Special Requests BOTTLES DRAWN  AEROBIC AND ANAEROBIC 5ML  Final   Culture   Final    NO GROWTH 1 DAY Performed at [facility]    Report Status PENDING  Incomplete  Culture, blood (routine x 2)     Status: None (Preliminary result)   Collection Time: 07/04/15 10:10 PM  Result Value Ref Range Status   Specimen Description BLOOD RIGHT ANTECUBITAL  Final   Special Requests BOTTLES DRAWN AEROBIC AND ANAEROBIC 5CC  Final   Culture   Final    NO GROWTH 1 DAY Performed at [facility]    Report Status PENDING  Incomplete  Urine culture     Status: None   Collection Time: 07/04/15 10:22 PM  Result Value Ref Range Status   Specimen Description URINE, CLEAN CATCH  Final   Special Requests NONE  Final   Culture   Final    NO GROWTH 1 DAY Performed at [facility]    Report Status 07/06/2015 FINAL  Final     Studies: Dg Chest 2 View  07/06/2015  CLINICAL DATA:  Influenza with difficulty breathing and fever EXAM: CHEST  2 VIEW COMPARISON:  July 04, 2015 FINDINGS: There is no edema or consolidation. Heart is upper normal in size with pulmonary vascularity within normal limits. No adenopathy. No bone lesions. IMPRESSION: No edema or consolidation. Electronically Signed   By: Lowella Grip III M.D.   On: 07/06/2015 17:45    Scheduled Meds: . albuterol  2.5 mg Nebulization TID  . carvedilol  12.5 mg Oral BID WC  . cloNIDine  0.1 mg Oral TID  . diltiazem  120 mg Oral Daily  . enoxaparin (LOVENOX) injection  80 mg Subcutaneous QHS  . guaiFENesin  1,200 mg Oral BID  . hydrALAZINE  50 mg Oral BID  . montelukast  10 mg Oral QHS  . oseltamivir  75 mg Oral BID  . potassium chloride  20 mEq Oral Daily  . torsemide  40 mg Oral Daily   Continuous Infusions:   Principal Problem:   Influenza with pneumonia Active Problems:   OSA on CPAP   Hypokalemia   Essential hypertension   Elevated troponin    Time spent: 15 min    Phillip [name]  [facility] Pager [phone]. If 7PM-7AM,  please contact night-coverage at [email or web address], password [email or web address] 07/07/2015, 3:22 PM  LOS: 3 days

## 2015-07-08 DIAGNOSIS — G4733 Obstructive sleep apnea (adult) (pediatric): Secondary | ICD-10-CM

## 2015-07-08 MED ORDER — OSELTAMIVIR PHOSPHATE 75 MG PO CAPS: 75.0000 mg | ORAL_CAPSULE | Freq: Two times a day (BID) | ORAL | Status: DC

## 2015-07-08 MED ORDER — GUAIFENESIN ER 600 MG PO TB12: 1200.0000 mg | ORAL_TABLET | Freq: Two times a day (BID) | ORAL | Status: DC

## 2015-07-08 MED ORDER — CLONIDINE HCL 0.1 MG PO TABS: 0.1000 mg | ORAL_TABLET | Freq: Three times a day (TID) | ORAL | Status: DC

## 2015-07-08 MED ORDER — ALBUTEROL SULFATE (2.5 MG/3ML) 0.083% IN NEBU: 2.5000 mg | INHALATION_SOLUTION | RESPIRATORY_TRACT | Status: DC | PRN

## 2015-07-08 MED ORDER — POTASSIUM CHLORIDE CRYS ER 20 MEQ PO TBCR
40.0000 meq | EXTENDED_RELEASE_TABLET | Freq: Four times a day (QID) | ORAL | Status: DC
  Administered 2015-07-08: 40 meq via ORAL
  Filled 2015-07-08: qty 2

## 2015-07-08 NOTE — Discharge Summary (Signed)
Physician Discharge Summary  Phillip [name] [id number] DOB: Jul 18, 1960 DOA: 07/04/2015  PCP: [name], MD  Admit date: 07/04/2015 Discharge date: 07/08/2015  Time spent: 35 minutes  Recommendations for Outpatient Follow-up:  1. Please follow-up on blood pressures, he was hypertensive during this hospitalization for which clonidine 0.1 mg was increased to 3 times a day dosing from daily 2. He was admitted for influenza  Discharge Diagnoses:  Principal Problem:   Influenza with pneumonia Active Problems:   OSA on CPAP   Hypokalemia   Essential hypertension   Elevated troponin   Discharge Condition: Stable  Diet recommendation: Heart healthy  Filed Weights   07/05/15 0049 07/07/15 0954 07/08/15 0447  Weight: 175.1 kg (386 lb 0.4 oz) 173.1 kg (381 lb 9.9 oz) 174.272 kg (384 lb 3.2 oz)    History of present illness:  Phillip [name] is a 55 y.o. male with a past medical history hypertension, gout, OSA on CPAP, morbid obesity, pulmonary embolism, pneumonia, CHF who comes emergency department with above chief complaint.  Per patient, last night he went to bed feeling okay. He states that he usually wakes up like clockwork at 3:30 to go urinate, but last night around 1 AM he woke up with persistent dry cough, that trigger 1 episode of emesis. Since then, he has been coughing very often despite using antitussive medication. He is states that he now has chest pain from all the coughing. He had a low-grade temperature at home, but had 102F fever while in the ER. He denies travel history or sick contacts. He states that he lives alone and given all his medical problems in the last few months, he has unable to work since November.  In the ER, the patient was hypoxic in the 80s without supplemental oxygen. This normalized after the patient was put on nasal cannula oxygen. Workup is significant for mildly elevated troponin and hypokalemia. Chest radiograph did not show any acute  findings  Hospital Course:  [name] is a pleasant 55 year old gentleman with a past medical history of hypertension, morbid obesity who was admitted to the medicine service on 07/04/2015 when he presented with complaints of cough, fevers, chills and pleuritic type chest pain. Chest x-ray did not reveal acute cardiopulmonary disease. Flu swab came back positive for influenza B. He was started on Tamiflu. Repeat chest x-ray 07/06/2015 did not reveal evidence of edema or consolidation as he showed gradual clinical improvement. During this hospitalization he was hypertensive for which his clonidine was increased from 0.1 mg daily to 0.1 mg by mouth 3 times a day. By 07/08/2015 he reported feeling off to go home and was discharged on this date   Discharge Exam: Filed Vitals:   07/08/15 0447 07/08/15 0746  BP: 152/76 144/78  Pulse: 63 59  Temp: 98.4 F (36.9 C)   Resp: 20     General: Sitting up at bedside chair, awake and alert no acute distress states feeling a little better Cardiovascular: Regular rate rhythm normal S1-S2 no murmurs rubs or gallops Respiratory: As by lateral crackles and rhonchi, although overall showing improvement Abdomen: Soft nontender nondistended  Discharge Instructions   Discharge Instructions    Call MD for:  difficulty breathing, headache or visual disturbances    Complete by:  As directed      Call MD for:  extreme fatigue    Complete by:  As directed      Call MD for:  hives    Complete by:  As directed  Call MD for:  persistant dizziness or light-headedness    Complete by:  As directed      Call MD for:  persistant nausea and vomiting    Complete by:  As directed      Call MD for:  redness, tenderness, or signs of infection (pain, swelling, redness, odor or green/yellow discharge around incision site)    Complete by:  As directed      Call MD for:  severe uncontrolled pain    Complete by:  As directed      Call MD for:  temperature >100.4     Complete by:  As directed      Call MD for:    Complete by:  As directed      Diet - low sodium heart healthy    Complete by:  As directed      Increase activity slowly    Complete by:  As directed           Current Discharge Medication List    START taking these medications   Details  albuterol (PROVENTIL) (2.5 MG/3ML) 0.083% nebulizer solution Take 3 mLs (2.5 mg total) by nebulization every 4 (four) hours as needed for wheezing or shortness of breath. Qty: 75 mL, Refills: 12    oseltamivir (TAMIFLU) 75 MG capsule Take 1 capsule (75 mg total) by mouth 2 (two) times daily. Qty: 4 capsule, Refills: 0      CONTINUE these medications which have CHANGED   Details  cloNIDine (CATAPRES) 0.1 MG tablet Take 1 tablet (0.1 mg total) by mouth 3 (three) times daily. Qty: 60 tablet, Refills: 11    guaiFENesin (MUCINEX) 600 MG 12 hr tablet Take 2 tablets (1,200 mg total) by mouth 2 (two) times daily. Qty: 30 tablet, Refills: 0      CONTINUE these medications which have NOT CHANGED   Details  albuterol (PROVENTIL HFA;VENTOLIN HFA) 108 (90 BASE) MCG/ACT inhaler Inhale 2 puffs into the lungs every 6 (six) hours as needed for shortness of breath. Qty: 18 g, Refills: 0    aspirin 81 MG chewable tablet Chew 1 tablet (81 mg total) by mouth daily. Qty: 30 tablet, Refills: 3    carvedilol (COREG) 12.5 MG tablet Take 1 tablet (12.5 mg total) by mouth 2 (two) times daily with a meal. Qty: 60 tablet, Refills: 3    colchicine 0.6 MG tablet Take 0.6 mg by mouth daily as needed (gout flare up.). For gout    dextromethorphan (ROBITUSSIN 12 HOUR COUGH) 30 MG/5ML liquid Take 15 mg by mouth daily as needed for cough.    diltiazem (CARDIZEM CD) 120 MG 24 hr capsule Take 1 capsule (120 mg total) by mouth daily. Qty: 30 capsule, Refills: 0    hydrALAZINE (APRESOLINE) 50 MG tablet Take 50 mg by mouth 2 (two) times daily.    ibuprofen (ADVIL,MOTRIN) 200 MG tablet Take 200 mg by mouth every 6 (six) hours  as needed for moderate pain.    montelukast (SINGULAIR) 10 MG tablet Take 10 mg by mouth at bedtime.    potassium chloride (K-DUR) 10 MEQ tablet Take 1 tablet (10 mEq total) by mouth daily. Qty: 30 tablet, Refills: 0    torsemide (DEMADEX) 20 MG tablet Take 40 mg by mouth daily.      STOP taking these medications     atorvastatin (LIPITOR) 40 MG tablet      docusate sodium (COLACE) 100 MG capsule      febuxostat (ULORIC) 40 MG tablet  isosorbide-hydrALAZINE (BIDIL) 20-37.5 MG tablet      pantoprazole (PROTONIX) 40 MG tablet      predniSONE (DELTASONE) 10 MG tablet        No Known Allergies    The results of significant diagnostics from this hospitalization (including imaging, microbiology, ancillary and laboratory) are listed below for reference.    Significant Diagnostic Studies: Dg Chest 2 View  07/06/2015  CLINICAL DATA:  Influenza with difficulty breathing and fever EXAM: CHEST  2 VIEW COMPARISON:  July 04, 2015 FINDINGS: There is no edema or consolidation. Heart is upper normal in size with pulmonary vascularity within normal limits. No adenopathy. No bone lesions. IMPRESSION: No edema or consolidation. Electronically Signed   By: Lowella Grip III M.D.   On: 07/06/2015 17:45   Dg Chest 2 View  07/04/2015  CLINICAL DATA:  Cough and fever with chest tightness since 1 a.m. this morning EXAM: CHEST  2 VIEW COMPARISON:  06/20/2015 FINDINGS: Heart is borderline in size. Lungs are clear. No effusions. No acute bony abnormality. IMPRESSION: No active cardiopulmonary disease. Electronically Signed   By: Rolm Baptise M.D.   On: 07/04/2015 20:49   Dg Chest 2 View  06/20/2015  CLINICAL DATA:  Chest pain, left side back pain, shortness of breath starting today EXAM: CHEST  2 VIEW COMPARISON:  05/18/2015 FINDINGS: Cardiomediastinal silhouette is stable. No acute infiltrate or pleural effusion. No pulmonary edema. Mild elevation of the right hemidiaphragm again noted.  IMPRESSION: No active cardiopulmonary disease. Electronically Signed   By: Lahoma Crocker M.D.   On: 06/20/2015 18:06   Ct Angio Chest Pe W/cm &/or Wo Cm  06/20/2015  CLINICAL DATA:  Acute onset of shortness of breath while doing laundry today. Personal history of pulmonary emboli. EXAM: CT ANGIOGRAPHY CHEST WITH CONTRAST TECHNIQUE: Multidetector CT imaging of the chest was performed using the standard protocol during bolus administration of intravenous contrast. Multiplanar CT image reconstructions and MIPs were obtained to evaluate the vascular anatomy. CONTRAST:  100 cc Isovue 370 COMPARISON:  Radiography same day.  CT 04/26/2014 FINDINGS: The lungs are clear. Pulmonary arterial opacification is moderate. No pulmonary emboli. The aorta appears normal. No visible coronary artery calcification. No pleural or pericardial fluid. No acute bone finding. Review of the MIP images confirms the above findings. IMPRESSION: Negative exam. Limited detail because of patient's size. The lungs are clear. No pulmonary emboli seen. Electronically Signed   By: Nelson Chimes M.D.   On: 06/20/2015 21:25    Microbiology: Recent Results (from the past 240 hour(s))  Culture, blood (routine x 2)     Status: None (Preliminary result)   Collection Time: 07/04/15 10:04 PM  Result Value Ref Range Status   Specimen Description BLOOD RIGHT ANTECUBITAL  Final   Special Requests BOTTLES DRAWN AEROBIC AND ANAEROBIC 5ML  Final   Culture   Final    NO GROWTH 2 DAYS Performed at [facility]    Report Status PENDING  Incomplete  Culture, blood (routine x 2)     Status: None (Preliminary result)   Collection Time: 07/04/15 10:10 PM  Result Value Ref Range Status   Specimen Description BLOOD RIGHT ANTECUBITAL  Final   Special Requests BOTTLES DRAWN AEROBIC AND ANAEROBIC 5CC  Final   Culture   Final    NO GROWTH 2 DAYS Performed at [facility]    Report Status PENDING  Incomplete  Urine culture     Status: None    Collection Time: 07/04/15 10:22  PM  Result Value Ref Range Status   Specimen Description URINE, CLEAN CATCH  Final   Special Requests NONE  Final   Culture   Final    NO GROWTH 1 DAY Performed at [facility]    Report Status 07/06/2015 FINAL  Final     Labs: Basic Metabolic Panel:  Recent Labs Lab 07/04/15 2109 07/05/15 0509 07/06/15 0507 07/07/15 0503  NA 142 144 141 144  K 3.1* 3.5 3.3* 3.4*  CL 101 100* 98* 98*  CO2 33* 32 33* 35*  GLUCOSE 147* 142* 104* 104*  BUN 18 18 21* 24*  CREATININE 1.95* 1.70* 1.89* 1.90*  CALCIUM 9.2 9.1 8.6* 8.7*   Liver Function Tests:  Recent Labs Lab 07/05/15 0509 07/06/15 0507 07/07/15 0503  AST 23 31 43*  ALT 14* 17 21  ALKPHOS 65 55 50  BILITOT 0.7 0.6 0.7  PROT 6.9 6.2* 6.5  ALBUMIN 4.1 3.6 3.7   No results for input(s): LIPASE, AMYLASE in the last 168 hours. No results for input(s): AMMONIA in the last 168 hours. CBC:  Recent Labs Lab 07/04/15 2109 07/05/15 0509 07/06/15 0507 07/07/15 0503  WBC 5.6 5.1 4.4 3.4*  NEUTROABS 4.5 4.5 2.3 1.3*  HGB 13.0 12.3* 12.0* 12.5*  HCT 41.0 39.0 38.2* 40.0  MCV 85.2 84.6 85.1 85.8  PLT 150 147* 124* 135*   Cardiac Enzymes:  Recent Labs Lab 07/04/15 2109 07/05/15 0509 07/05/15 1113  TROPONINI 0.05* 0.05* 0.05*   BNP: BNP (last 3 results)  Recent Labs  05/18/15 2208 06/20/15 1825 07/04/15 2109  BNP 429.7* 394.0* 321.1*    ProBNP (last 3 results) No results for input(s): PROBNP in the last 8760 hours.  CBG: No results for input(s): GLUCAP in the last 168 hours.     Signed:  Kelvin Cellar MD.  [facility] 07/08/2015, 9:41 AM

## 2015-07-08 NOTE — Progress Notes (Signed)
Discharge paperwork complete. Prescriptions given. Patient will be D/C home in stable condition. Awaiting nebulizer machine before leaving.

## 2015-07-08 NOTE — Progress Notes (Signed)
CM notified [place] DME rep, Merry Proud to please deliver the nebulizer to room prior to discharge.  No other CM needs were communicated.

## 2015-07-08 NOTE — Progress Notes (Signed)
Patient states that he will self administer cpap when ready. RT will continue to monitor.

## 2015-07-10 LAB — CULTURE, BLOOD (ROUTINE X 2)
CULTURE: NO GROWTH
Culture: NO GROWTH

## 2015-07-24 ENCOUNTER — Ambulatory Visit (INDEPENDENT_AMBULATORY_CARE_PROVIDER_SITE_OTHER): Payer: Managed Care, Other (non HMO) | Admitting: Internal Medicine

## 2015-07-24 ENCOUNTER — Encounter: Payer: Self-pay | Admitting: Internal Medicine

## 2015-07-24 VITALS — BP 128/90 | HR 64 | Ht 69.5 in | Wt 389.4 lb

## 2015-07-24 DIAGNOSIS — J4530 Mild persistent asthma, uncomplicated: Secondary | ICD-10-CM

## 2015-07-24 DIAGNOSIS — R0602 Shortness of breath: Secondary | ICD-10-CM | POA: Diagnosis not present

## 2015-07-24 LAB — NITRIC OXIDE: Nitric Oxide: 7

## 2015-07-24 MED ORDER — FLUTICASONE FUROATE-VILANTEROL 100-25 MCG/INH IN AEPB: 1.0000 | INHALATION_SPRAY | Freq: Every morning | RESPIRATORY_TRACT | Status: DC

## 2015-07-24 NOTE — Patient Instructions (Signed)
Plan A = Automatic = BREO  Take one click each am - one or two smooth deep drags    Plan B = Backup Only use your albuterol as a rescue medication to be used if you can't catch your breath by resting or doing a relaxed purse lip breathing pattern.  - The less you use it, the better it will work when you need it. - Ok to use the inhaler up to 2 puffs  every 4 hours if you must but call for appointment if use goes up over your usual need - Don't leave home without it !!  (think of it like the spare tire for your car)   Plan C = Crisis - only use your albuterol nebulizer if you first try Plan B and it fails to help > ok to use the nebulizer up to every 4 hours but if start needing it regularly call for immediate appointment   Please schedule a follow up office visit in 6 weeks, call sooner if needed with all inhalers in hand

## 2015-07-24 NOTE — Progress Notes (Signed)
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 Please follow-up on blood pressures, he was hypertensive during this hospitalization for which clonidine 0.1 mg was increased to 3 times a day dosing from daily 2. He was admitted for influenza  Discharge Diagnoses:  Principal Problem:  Influenza with pneumonia Active Problems:  OSA on CPAP  Hypokalemia  Essential hypertension with nl echo 06/21/15   Elevated troponin  New rx: neb albuterol/ maint on coreg   07/24/2015 1st [facility] office visit/ Romona Murdy   Chief Complaint  Patient presents with  . Pulmonary Consult    self ref.-sob with exertion x 5 yrs.,wt. gain,[facility] last mth. with flu/pneumonia/CHF,cough-yellow,occass. wheezing,Using ventolin 1 x this wk.has neb. machine used 2x last wk.,midchest tightness,  Peak wt 429 at [facility] with pna and breathing improved since then  Back to baseline = 100 yards s stopping and be tired at slower [facility] = can't walk a nl pace on a flat grade s sob but does fine slow and flat eg shopping ok/ crosses parking lot  Can't go up steps at all s stopping multiple times  Sleeping fine on cpap per PC Has breo but prefers to use saba   No obvious day to day or daytime variability or assoc chronic cough or cp or chest tightness, subjective wheeze or overt sinus or hb symptoms. No unusual exp hx or h/o childhood pna/ asthma or knowledge of premature birth.  Sleeping ok without nocturnal  or early am exacerbation  of respiratory  c/o's or  need for noct saba. Also denies any obvious fluctuation of symptoms with weather or environmental changes or other aggravating or alleviating factors except as outlined above   Current Medications, Allergies, Complete Past Medical History, Past Surgical History, Family History, and Social History were reviewed in [employer] record.  ROS  The following are not active complaints unless bolded sore throat, dysphagia, dental problems, itching, sneezing,  nasal congestion or excess/ purulent secretions, ear ache,   fever, chills, sweats, unintended wt loss, classically pleuritic or exertional cp, hemoptysis,  orthopnea pnd or leg swelling, presyncope, palpitations, abdominal pain, anorexia, nausea, vomiting, diarrhea  or change in bowel or bladder habits, change in stools or urine, dysuria,hematuria,  rash, arthralgias, visual complaints, headache, numbness, weakness or ataxia or problems with walking or coordination,  change in mood/affect or memory.                Review of Systems     Objective:   Physical Exam    massively obese bm nad  Wt Readings from Last 3 Encounters:  07/24/15 389 lb 6.4 oz (176.631 kg)  07/08/15 384 lb 3.2 oz (174.272 kg)  06/21/15 390 lb 8 oz (177.13 kg)    Vital signs reviewed  HEENT: nl dentition, turbinates, and oropharynx. Nl external ear canals without cough reflex   NECK :  without JVD/Nodes/TM/ nl carotid upstrokes bilaterally   LUNGS: no acc muscle use,  Nl  contour chest which is clear to A and P bilaterally without cough on insp or exp maneuvers   CV:  RRR  no s3 or murmur or increase in P2, no edema   ABD:  soft and nontender with nl inspiratory excursion in the supine position. No bruits or organomegaly, bowel sounds nl  MS:  Nl gait/ ext warm without deformities, calf tenderness, cyanosis or clubbing No obvious joint restrictions   SKIN: warm and dry without lesions    NEURO:  alert, approp, nl sensorium with   no motor deficits      I personally reviewed images and agree with radiology impression as follows:  CXR:  07/06/15 No edema or consolidation.    I personally reviewed images and agree with radiology impression as follows:  CT Chest   06/20/15 Negative exam. Limited detail because of patient's size. The lungs are clear. No pulmonary emboli seen.    Assessment:

## 2015-07-25 DIAGNOSIS — J4530 Mild persistent asthma, uncomplicated: Secondary | ICD-10-CM | POA: Insufficient documentation

## 2015-07-25 NOTE — Assessment & Plan Note (Signed)
Body mass index is 56.7    Lab Results  Component Value Date   TSH 1.724 05/07/2014     Contributing to gerd tendency/ doe/reviewed the need and the process to achieve and maintain neg calorie balance > defer f/u primary care including intermittently monitoring thyroid status

## 2015-07-25 NOTE — Assessment & Plan Note (Signed)
Spirometry more c/w restrictive changes than obst/ reviewed

## 2015-07-25 NOTE — Assessment & Plan Note (Addendum)
NO  07/24/2015   =  7 - Spirometry 07/24/2015  FEV1 1.56 (48%)  Ratio 69 with fef25-75 22% s rx day of study   DDX of  difficult airways management almost all start with A and  include Adherence, Ace Inhibitors, Acid Reflux, Active Sinus Disease, Alpha 1 Antitripsin deficiency, Anxiety masquerading as Airways dz,  ABPA,  Allergy(esp in young), Aspiration (esp in elderly), Adverse effects of meds,  Active smokers, A bunch of PE's (a small clot burden can't cause this syndrome unless there is already severe underlying pulm or vascular dz with poor reserve) plus two Bs  = Bronchiectasis and Beta blocker use..and one C= CHF   In this case Adherence is the biggest issue and starts with  inability to use HFA effectively and also  understand that SABA treats the symptoms but doesn't get to the underlying problem (inflammation).  I used  the analogy of putting steroid cream on a rash to help explain the meaning of topical therapy and the need to get the drug to the target tissue.   - - The proper method of use, as well as anticipated side effects, of a metered-dose inhaler are discussed and demonstrated to the patient. Improved effectiveness after extensive coaching during this visit to a level of approximately 90 % from a baseline of 75 % > try restart Breo 100 each am   ? Allergy > unlikely with NO so low, no need for testing for now  ? BB > if BB needed: Strongly prefer in this setting: Bystolic, the most beta -1  selective Beta blocker available in sample form, with bisoprolol the most selective generic choice  on the market.   Total time devoted to counseling  = 35/23m review case with pt/ discussion of options/alternatives/ personally creating in presence of pt  then going over specific  Instructions directly with the pt including how to use all of the meds but in particular covering each new medication in detail (see avs)

## 2015-08-04 ENCOUNTER — Encounter: Payer: Self-pay | Admitting: Interventional Cardiology

## 2015-08-04 DIAGNOSIS — I480 Paroxysmal atrial fibrillation: Secondary | ICD-10-CM | POA: Insufficient documentation

## 2015-08-06 ENCOUNTER — Encounter: Payer: Self-pay | Admitting: Interventional Cardiology

## 2015-08-06 ENCOUNTER — Ambulatory Visit (INDEPENDENT_AMBULATORY_CARE_PROVIDER_SITE_OTHER): Payer: Managed Care, Other (non HMO) | Admitting: Interventional Cardiology

## 2015-08-06 VITALS — BP 134/84 | HR 73 | Ht 70.0 in | Wt 381.2 lb

## 2015-08-06 DIAGNOSIS — Z9989 Dependence on other enabling machines and devices: Secondary | ICD-10-CM

## 2015-08-06 DIAGNOSIS — G4733 Obstructive sleep apnea (adult) (pediatric): Secondary | ICD-10-CM | POA: Diagnosis not present

## 2015-08-06 DIAGNOSIS — I480 Paroxysmal atrial fibrillation: Secondary | ICD-10-CM

## 2015-08-06 DIAGNOSIS — I10 Essential (primary) hypertension: Secondary | ICD-10-CM | POA: Diagnosis not present

## 2015-08-06 DIAGNOSIS — I5042 Chronic combined systolic (congestive) and diastolic (congestive) heart failure: Secondary | ICD-10-CM | POA: Diagnosis not present

## 2015-08-06 DIAGNOSIS — I471 Supraventricular tachycardia: Secondary | ICD-10-CM

## 2015-08-06 MED ORDER — WARFARIN SODIUM 5 MG PO TABS: 5.0000 mg | ORAL_TABLET | Freq: Every day | ORAL | Status: DC

## 2015-08-06 NOTE — Progress Notes (Signed)
Cardiology Office Note    Date:  08/06/2015   ID:  [name], [name] [birth year], MRN [id number]  PCP:  [name], MD  Cardiologist: Sinclair Grooms, MD   Chief Complaint  Patient presents with  . Atrial Fibrillation    History of Present Illness:  Phillip [name] is a 55 y.o. 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  Admitted 01/2015 w/ SOB, LE edema. EF 40-45% by echo, no WMA, grade 1 dd.   He was readmitted to the hospital on 3/29 with sudden onset of fatigue and dyspnea on exertion. With his prior history of PE, he became concerned and came to the emergency room. While on telemetry he was noted to have SVT at 175 bpm  He was asymptomatic. The arrhythmia converted spontaneously. He admitted a history of occasional palpitations. At discharge from the hospital and wore a 30 day monitor. The monitor demonstrated several episodes of atrial fibrillation with controlled ventricular response. No PSVT was noted. The patient is back today for follow-up and knows that 24 hours ago while in the shower he developed sudden rapid palpitations and felt as though he might faint. This lasted approximately 15 minutes and resolved. He had never felt this way before.  He denies chest pain. He denies orthopnea. He has been purposefully losing weight and is now down into the 300s. He was previously greater than 425 pounds.  Past Medical History  Diagnosis Date  . Hypertension   . Gout   . OSA on CPAP   . Morbid obesity ([place])   . Pulmonary embolism ([place])   . PNA (pneumonia)   . CHF (congestive heart failure) [facility])     Past Surgical History  Procedure Laterality Date  . Hernia repair      Current Medications: Outpatient  Prescriptions Prior to Visit  Medication Sig Dispense Refill  . albuterol (PROVENTIL HFA;VENTOLIN HFA) 108 (90 BASE) MCG/ACT inhaler Inhale 2 puffs into the lungs every 6 (six) hours as needed for shortness of breath. 18 g 0  . albuterol (PROVENTIL) (2.5 MG/3ML) 0.083% nebulizer solution Take 3 mLs (2.5 mg total) by nebulization every 4 (four) hours as needed for wheezing or shortness of breath. 75 mL 12  . cloNIDine (CATAPRES) 0.1 MG tablet Take 1 tablet (0.1 mg total) by mouth 3 (three) times daily. 60 tablet 11  . colchicine 0.6 MG tablet Take 0.6 mg by mouth daily as needed (gout flare up.). For gout    . fluticasone furoate-vilanterol (BREO ELLIPTA) 100-25 MCG/INH AEPB Inhale 1 puff into the lungs every morning.    . hydrALAZINE (APRESOLINE) 50 MG tablet Take 50 mg by mouth 2 (two) times daily.    [name] ibuprofen (ADVIL,MOTRIN) 200 MG tablet Take 200 mg by mouth every 6 (six) hours as needed for moderate pain.    . montelukast (SINGULAIR) 10 MG tablet Take 10 mg by mouth at bedtime.    . torsemide (DEMADEX) 20 MG tablet Take 40 mg by mouth daily.    [name] aspirin 81 MG chewable tablet Chew 1 tablet (81 mg total) by mouth daily. (Patient not taking: Reported on 08/06/2015) 30 tablet 3  . diltiazem (CARDIZEM CD) 120 MG 24 hr capsule Take 1 capsule (120 mg total) by mouth daily. 30 capsule 0  .  potassium chloride (K-DUR) 10 MEQ tablet Take 1 tablet (10 mEq total) by mouth daily. 30 tablet 0   No facility-administered medications prior to visit.     Allergies:   Review of patient's allergies indicates no known allergies.   Social History   Social History  . Marital Status: Divorced    Spouse Name: N/A  . Number of Children: N/A  . Years of Education: N/A   Occupational History  . disabled, drove a dump truck    Social History Main Topics  . Smoking status: Former Smoker -- 0.25 packs/day for 1 years    Types: Cigarettes    Quit date: 07/23/1989  . Smokeless tobacco: Never Used  . Alcohol  Use: No  . Drug Use: No  . Sexual Activity: Not Currently   Other Topics Concern  . None   Social History Narrative   Pt lives in [place], alone.   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   Has 2 boys.     Family History:  The patient's His father has undergone heart transplantation. family history includes Allergies in his brother; CAD in his father; Diabetes in his father and other; Hypertension in his other.   ROS:   Please see the history of present illness.   Dyspnea on exertion, irregular heartbeat, and lower extremity swelling intermittently.All other systems reviewed and are negative.   PHYSICAL EXAM:   VS:  BP 134/84 mmHg  Pulse 73  Ht 5\' 10"  (1.778 m)  Wt 381 lb 3.2 oz (172.911 kg)  BMI 54.70 kg/m2   GEN: Well nourished, well developed, in no acute distress HEENT: normal Neck: no JVD, carotid bruits, or masses Cardiac: RRR; no murmurs, rubs, or gallops,no edema  Respiratory:  clear to auscultation bilaterally, normal work of breathing GI: soft, nontender, nondistended, + BS MS: no deformity or atrophy Skin: warm and dry, no rash Neuro:  Alert and Oriented x 3, Strength and sensation are intact Psych: euthymic mood, full affect  Wt Readings from Last 3 Encounters:  08/06/15 381 lb 3.2 oz (172.911 kg)  07/24/15 389 lb 6.4 oz (176.631 kg)  07/08/15 384 lb 3.2 oz (174.272 kg)      Studies/Labs Reviewed:   EKG:  EKG Not repeated. The most recent EKG reveals left axis deviation, normal sinus rhythm, left atrial abnormality, poor wave progression, and nonspecific ST-T wave abnormality. This tracing is from 07/05/2015  Recent Labs: 06/21/2015: Magnesium 2.1 07/04/2015: B Natriuretic Peptide 321.1* 07/07/2015: ALT 21; BUN 24*; Creatinine, Ser 1.90*; Hemoglobin 12.5*; Platelets 135*; Potassium 3.4*; Sodium 144   Lipid Panel    Component Value Date/Time   CHOL 175 02/21/2015 0431   TRIG 37 02/21/2015 0431   HDL 66 02/21/2015 0431     CHOLHDL 2.7 02/21/2015 0431   VLDL 7 02/21/2015 0431   LDLCALC 102* 02/21/2015 0431    Additional studies/ records that were reviewed today include:  Review of monitor strips from 06/21/2015 while hospitalized does reveal rapid narrow complex SVT that is self terminating.  30 day monitor completed 07/25/2015:  Study Highlights     Basic rhythm is NSR  Paroxysmal AF with controlled rate  PAF is noted.   ECHOCARDIOGRAM 06/21/15: Study Conclusions  - Procedure narrative: Transthoracic echocardiography. Image  quality was adequate. The study was technically difficult, as a  result of poor sound wave transmission and body habitus. - Left ventricle: The cavity size was moderately dilated. There was  moderate concentric hypertrophy.  Systolic function was normal.  The estimated ejection fraction was in the range of 55% to 60%.  Doppler parameters are consistent with abnormal left ventricular  relaxation (grade 1 diastolic dysfunction). The E/e&' ratio is  between 8-15, suggesting indeterminate LV filling pressure. - Aortic valve: Poorly visualized. There was moderate  regurgitation. - Left atrium: The atrium was normal in size. - Inferior vena cava: The vessel was normal in size. The  respirophasic diameter changes were in the normal range (>= 50%),  consistent with normal central venous pressure.   DIAGNOSIS: 1. Paroxysmal atrial fibrillation (HCC)   2. Paroxysmal supraventricular tachycardia ([place])   3. Chronic combined systolic and diastolic CHF (congestive heart failure) ([place])   4. Essential hypertension   5. OSA on CPAP      PLAN:  In order of problems listed above:  1. Atrial fibrillation has been documented on the 30 day monitor. CHADS VASC score is 2 (prior history of congestive heart failure and hypertension). Coumadin therapy is started for long-term prophylaxis against embolic stroke. 2.   PSVT has been previously documented and I'm concerned by the  most recent episode of very rapid tachycardia with near syncope that occurred within the last 24 hours. I presume that this was PSVT but could actually be some other more malignant arrhythmia. Rather than performing an additional 30 day monitor, I believe a loop recorder may be indicated. This will help [place] further determine management strategy for the patient depending upon the relative frequency of atrial fibrillation and/or PSVT. Whether rhythm control will become necessary will be made more obvious with ability to have more continuous monitoring. 3.  Most recent echocardiogram 05/2015 reveals EF 50-60% versus 40-45% in 01/2015. The LV is dilated and there is moderately severe left ventricular hypertrophy. I encouraged continued efforts at weight loss, salt restriction, and aerobic activity. 4.  Restriction and Weight Loss or Recommended for Management of Hypertension    Medication Adjustments/Labs and Tests Ordered: Current medicines are reviewed at length with the patient today.  Concerns regarding medicines are outlined above.  Medication changes, Labs and Tests ordered today are listed in the Patient Instructions below. Patient Instructions  Medication Instructions:  Your physician has recommended you make the following change in your medication:  1. Start coumadin ( 5 mg ) daily today, [facility] [place]: -None  Testing/Procedures: -None  Follow-Up: Your physician recommends that you keep your scheduled  follow-up appointment with Dr. Gaspar Bidding have been referred to coumadin clinic to get your INR checked You have been referred to EP for possible loop recorder   Any Other Special Instructions Will Be Listed Below (If Applicable).     If you need a refill on your cardiac medications before your next appointment, please call your pharmacy.       Signed, Sinclair Grooms, MD  08/06/2015 5:30 PM    [address],  [place], [place]  [address] Phone: [phone]; Fax: [phone]

## 2015-08-06 NOTE — Patient Instructions (Signed)
Medication Instructions:  Your physician has recommended you make the following change in your medication:  1. Start coumadin ( 5 mg ) daily today, [facility] [place]: -None  Testing/Procedures: -None  Follow-Up: Your physician recommends that you keep your scheduled  follow-up appointment with Dr. Gaspar Bidding have been referred to coumadin clinic to get your INR checked You have been referred to EP for possible loop recorder   Any Other Special Instructions Will Be Listed Below (If Applicable).     If you need a refill on your cardiac medications before your next appointment, please call your pharmacy.

## 2015-08-17 ENCOUNTER — Ambulatory Visit (INDEPENDENT_AMBULATORY_CARE_PROVIDER_SITE_OTHER): Payer: Managed Care, Other (non HMO) | Admitting: *Deleted

## 2015-08-17 DIAGNOSIS — I82409 Acute embolism and thrombosis of unspecified deep veins of unspecified lower extremity: Secondary | ICD-10-CM

## 2015-08-17 DIAGNOSIS — Z86718 Personal history of other venous thrombosis and embolism: Secondary | ICD-10-CM

## 2015-08-17 DIAGNOSIS — I4891 Unspecified atrial fibrillation: Secondary | ICD-10-CM | POA: Insufficient documentation

## 2015-08-17 DIAGNOSIS — Z7901 Long term (current) use of anticoagulants: Secondary | ICD-10-CM | POA: Diagnosis not present

## 2015-08-17 DIAGNOSIS — Z86711 Personal history of pulmonary embolism: Secondary | ICD-10-CM

## 2015-08-17 HISTORY — DX: Acute embolism and thrombosis of unspecified deep veins of unspecified lower extremity: I82.409

## 2015-08-17 LAB — POCT INR: INR: 1.3

## 2015-08-17 NOTE — Patient Instructions (Signed)

## 2015-08-24 ENCOUNTER — Ambulatory Visit (INDEPENDENT_AMBULATORY_CARE_PROVIDER_SITE_OTHER): Payer: Managed Care, Other (non HMO) | Admitting: *Deleted

## 2015-08-24 DIAGNOSIS — Z7901 Long term (current) use of anticoagulants: Secondary | ICD-10-CM | POA: Diagnosis not present

## 2015-08-24 DIAGNOSIS — Z86718 Personal history of other venous thrombosis and embolism: Secondary | ICD-10-CM

## 2015-08-24 DIAGNOSIS — I4891 Unspecified atrial fibrillation: Secondary | ICD-10-CM | POA: Diagnosis not present

## 2015-08-24 DIAGNOSIS — Z86711 Personal history of pulmonary embolism: Secondary | ICD-10-CM | POA: Diagnosis not present

## 2015-08-24 LAB — POCT INR: INR: 1.3

## 2015-08-28 ENCOUNTER — Other Ambulatory Visit: Payer: Self-pay | Admitting: Pharmacist

## 2015-08-28 DIAGNOSIS — G4733 Obstructive sleep apnea (adult) (pediatric): Secondary | ICD-10-CM

## 2015-08-28 DIAGNOSIS — Z9989 Dependence on other enabling machines and devices: Secondary | ICD-10-CM

## 2015-08-28 DIAGNOSIS — I5042 Chronic combined systolic (congestive) and diastolic (congestive) heart failure: Secondary | ICD-10-CM

## 2015-08-28 DIAGNOSIS — I10 Essential (primary) hypertension: Secondary | ICD-10-CM

## 2015-08-28 DIAGNOSIS — I480 Paroxysmal atrial fibrillation: Secondary | ICD-10-CM

## 2015-08-28 MED ORDER — WARFARIN SODIUM 5 MG PO TABS: ORAL_TABLET | ORAL | Status: DC

## 2015-08-31 ENCOUNTER — Ambulatory Visit (INDEPENDENT_AMBULATORY_CARE_PROVIDER_SITE_OTHER): Payer: Managed Care, Other (non HMO) | Admitting: *Deleted

## 2015-08-31 DIAGNOSIS — Z86711 Personal history of pulmonary embolism: Secondary | ICD-10-CM | POA: Diagnosis not present

## 2015-08-31 DIAGNOSIS — Z86718 Personal history of other venous thrombosis and embolism: Secondary | ICD-10-CM | POA: Diagnosis not present

## 2015-08-31 DIAGNOSIS — Z7901 Long term (current) use of anticoagulants: Secondary | ICD-10-CM

## 2015-08-31 DIAGNOSIS — I4891 Unspecified atrial fibrillation: Secondary | ICD-10-CM | POA: Diagnosis not present

## 2015-08-31 LAB — POCT INR: INR: 1.4

## 2015-09-07 ENCOUNTER — Ambulatory Visit (INDEPENDENT_AMBULATORY_CARE_PROVIDER_SITE_OTHER): Payer: Managed Care, Other (non HMO) | Admitting: Internal Medicine

## 2015-09-07 ENCOUNTER — Encounter: Payer: Self-pay | Admitting: Internal Medicine

## 2015-09-07 ENCOUNTER — Encounter (HOSPITAL_COMMUNITY): Payer: Self-pay | Admitting: Emergency Medicine

## 2015-09-07 ENCOUNTER — Ambulatory Visit: Payer: Managed Care, Other (non HMO) | Admitting: Pharmacist

## 2015-09-07 ENCOUNTER — Emergency Department (HOSPITAL_COMMUNITY)
Admission: EM | Admit: 2015-09-07 | Discharge: 2015-09-08 | Disposition: A | Discharge status: Home / Self Care | Payer: Managed Care, Other (non HMO) | Attending: Emergency Medicine | Admitting: Emergency Medicine

## 2015-09-07 VITALS — BP 152/94 | HR 76 | Ht 70.0 in | Wt 370.0 lb

## 2015-09-07 DIAGNOSIS — R109 Unspecified abdominal pain: Secondary | ICD-10-CM | POA: Diagnosis not present

## 2015-09-07 DIAGNOSIS — I10 Essential (primary) hypertension: Secondary | ICD-10-CM

## 2015-09-07 DIAGNOSIS — Z86718 Personal history of other venous thrombosis and embolism: Secondary | ICD-10-CM

## 2015-09-07 DIAGNOSIS — Z87891 Personal history of nicotine dependence: Secondary | ICD-10-CM | POA: Diagnosis not present

## 2015-09-07 DIAGNOSIS — G4733 Obstructive sleep apnea (adult) (pediatric): Secondary | ICD-10-CM | POA: Diagnosis not present

## 2015-09-07 DIAGNOSIS — Z7901 Long term (current) use of anticoagulants: Secondary | ICD-10-CM

## 2015-09-07 DIAGNOSIS — Z7982 Long term (current) use of aspirin: Secondary | ICD-10-CM | POA: Diagnosis not present

## 2015-09-07 DIAGNOSIS — Z7951 Long term (current) use of inhaled steroids: Secondary | ICD-10-CM | POA: Insufficient documentation

## 2015-09-07 DIAGNOSIS — I110 Hypertensive heart disease with heart failure: Secondary | ICD-10-CM | POA: Insufficient documentation

## 2015-09-07 DIAGNOSIS — Z791 Long term (current) use of non-steroidal anti-inflammatories (NSAID): Secondary | ICD-10-CM | POA: Insufficient documentation

## 2015-09-07 DIAGNOSIS — I480 Paroxysmal atrial fibrillation: Secondary | ICD-10-CM

## 2015-09-07 DIAGNOSIS — I509 Heart failure, unspecified: Secondary | ICD-10-CM | POA: Diagnosis not present

## 2015-09-07 DIAGNOSIS — Z79899 Other long term (current) drug therapy: Secondary | ICD-10-CM | POA: Diagnosis not present

## 2015-09-07 DIAGNOSIS — I4891 Unspecified atrial fibrillation: Secondary | ICD-10-CM

## 2015-09-07 DIAGNOSIS — Z86711 Personal history of pulmonary embolism: Secondary | ICD-10-CM

## 2015-09-07 DIAGNOSIS — Z9989 Dependence on other enabling machines and devices: Secondary | ICD-10-CM

## 2015-09-07 MED ORDER — LOSARTAN POTASSIUM 25 MG PO TABS: 25.0000 mg | ORAL_TABLET | Freq: Every day | ORAL | Status: DC

## 2015-09-07 MED ORDER — RIVAROXABAN 20 MG PO TABS: 20.0000 mg | ORAL_TABLET | Freq: Every day | ORAL | Status: DC

## 2015-09-07 NOTE — Patient Instructions (Signed)
Medication Instructions:  Your physician has recommended you make the following change in your medication:  1)Stop Coumadin 2) Start Xarelto 20 mg daily 3) Start Losartan 25 mg daily   Labwork: None ordered   Testing/Procedures: None ordered   Follow-Up: Your physician recommends that you schedule a follow-up appointment in: 4 weeks with PCP(already scheduled) and as needed with Dr Rayann Heman   Any Other Special Instructions Will Be Listed Below (If Applicable).     If you need a refill on your cardiac medications before your next appointment, please call your pharmacy.

## 2015-09-07 NOTE — ED Notes (Signed)
Patient here with complaints of right side flank pain x3 weeks, increased today. Reports that he is taking a diuretic so does not know if he has urinary symptoms. Pain 10/10. Reports being on Coumadin but was discontinued today.

## 2015-09-07 NOTE — Progress Notes (Signed)
Electrophysiology Office Note   Date:  09/07/2015   ID:  Tayshawn, Breining 1960/07/22, MRN [id number]  PCP:  [name], MD  Cardiologist:  Dr [name] Primary Electrophysiologist: Thompson Grayer, MD    Chief Complaint  Patient presents with  . Atrial Fibrillation     History of Present Illness: Phillip [name] is a 55 y.o. male who presents today for electrophysiology evaluation.   The patient has extreme obesity (429 lbs at heaviest), OSA on CPAP, HTN, and recent diagnosis of afib who presents for EP consultation.  He was admitted 3/17 with "congestion".  He reports having difficulty with SOB at that time.  While ambulating to the bathroom, he had SVT on telemetry.  I have reviewed strips which revealed fast afib vs atach at that time.  He had an outpatient event monitor (also reviewed today) which documented afib with better rate control.  He has done well recently.  He continues to lose weight (has lost from 429 to 370 lbs).   His SOB has resolved.   He was evaluated by Dr [name] 08/06/15 and noted about 15 minutes of tachypalpitations with presyncope at that time.  He is referred for consideration of ILR implantation.   He denies any further palpitations, dizziness, presyncope or syncope.  Today, he denies symptoms of chest pain,  orthopnea, PND, lower extremity edema, claudication, further presyncope, syncope, bleeding, or neurologic sequela. The patient is tolerating medications without difficulties and is otherwise without complaint today.    Past Medical History  Diagnosis Date  . Gout   . OSA on CPAP   . Morbid obesity ([place])   . Pulmonary embolism ([place])   . PNA (pneumonia)   . CHF (congestive heart failure) ([place]) 11/15    EF 40-45%, improved with follow-up  . Hypertensive cardiovascular disease   . Paroxysmal atrial fibrillation (HCC)   . Obstructive sleep apnea     complaint with CPAP   Past Surgical History  Procedure Laterality Date  . Umbilical hernia repair  1992      Current Outpatient Prescriptions  Medication Sig Dispense Refill  . albuterol (PROVENTIL HFA;VENTOLIN HFA) 108 (90 BASE) MCG/ACT inhaler Inhale 2 puffs into the lungs every 6 (six) hours as needed for shortness of breath. 18 g 0  . albuterol (PROVENTIL) (2.5 MG/3ML) 0.083% nebulizer solution Take 3 mLs (2.5 mg total) by nebulization every 4 (four) hours as needed for wheezing or shortness of breath. 75 mL 12  . aspirin 81 MG tablet Take 81 mg by mouth at bedtime.    . cloNIDine (CATAPRES) 0.1 MG tablet Take 1 tablet (0.1 mg total) by mouth 3 (three) times daily. 60 tablet 11  . colchicine 0.6 MG tablet Take 0.6 mg by mouth daily as needed (gout flare up.). For gout    . fluticasone furoate-vilanterol (BREO ELLIPTA) 100-25 MCG/INH AEPB Inhale 1 puff into the lungs every morning.    . hydrALAZINE (APRESOLINE) 50 MG tablet Take 50 mg by mouth 2 (two) times daily.    [name] ibuprofen (ADVIL,MOTRIN) 200 MG tablet Take 200 mg by mouth every 6 (six) hours as needed for moderate pain.    . montelukast (SINGULAIR) 10 MG tablet Take 10 mg by mouth at bedtime.    . torsemide (DEMADEX) 20 MG tablet Take 40 mg by mouth daily.    [name] warfarin (COUMADIN) 5 MG tablet Take as directed by Coumadin Clinic. 50 tablet 1   No current facility-administered medications for this visit.  Allergies:   Review of patient's allergies indicates no known allergies.   Social History:  The patient  reports that he quit smoking about 26 years ago. His smoking use included Cigarettes. He has a .25 pack-year smoking history. He has never used smokeless tobacco. He reports that he does not drink alcohol or use illicit drugs.   Family History:  The patient's  family history includes Allergies in his brother; CAD in his father; Diabetes in his father and other; Hypertension in his other.    ROS:  Please see the history of present illness.   All other systems are reviewed and negative.    PHYSICAL EXAM: VS:  BP 152/94 mmHg   Pulse 76  Ht 5\' 10"  (1.778 m)  Wt 370 lb (167.831 kg)  BMI 53.09 kg/m2  SpO2 93% , BMI Body mass index is 53.09 kg/(m^2). GEN: morbid obesity, in no acute distress HEENT: normal Neck: no JVD, carotid bruits, or masses Cardiac: RRR; no murmurs, rubs, or gallops,+1 edema  Respiratory:  clear to auscultation bilaterally, normal work of breathing GI: soft, nontender, nondistended, + BS MS: no deformity or atrophy Skin: warm and dry  Neuro:  Strength and sensation are intact Psych: euthymic mood, full affect  EKG:  EKG is ordered today. The ekg ordered today shows sinus rhythm with PVCs, LVH, nonspecific ST/T changes   Recent Labs: 06/21/2015: Magnesium 2.1 07/04/2015: B Natriuretic Peptide 321.1* 07/07/2015: ALT 21; BUN 24*; Creatinine, Ser 1.90*; Hemoglobin 12.5*; Platelets 135*; Potassium 3.4*; Sodium 144    Lipid Panel     Component Value Date/Time   CHOL 175 02/21/2015 0431   TRIG 37 02/21/2015 0431   HDL 66 02/21/2015 0431   CHOLHDL 2.7 02/21/2015 0431   VLDL 7 02/21/2015 0431   LDLCALC 102* 02/21/2015 0431     Wt Readings from Last 3 Encounters:  09/07/15 370 lb (167.831 kg)  08/06/15 381 lb 3.2 oz (172.911 kg)  07/24/15 389 lb 6.4 oz (176.631 kg)      Other studies Reviewed: Additional studies/ records that were reviewed today include: event monitor, echos, hospital records, Dr Darliss Ridgel notes   ASSESSMENT AND PLAN:  1.  Paroxysmal atrial fibrillation Likely exacerbated by comorbidities including hypertension and morbid obesity On anticoagulatin with coumadin though has been subtherapeutic. Today, I discussed Coumadin as well as novel anticoagulants including Pradaxa, Xarelto, Savaysa, and Eliquis today as indicated for risk reduction in stroke and systemic emboli with nonvalvular atrial fibrillation.  Risks, benefits, and alternatives to each of these drugs were discussed at length today.  We also discussed that given his BMI, there is very little data for NOACs in  this patient group.  His CrCl is 100.  He is very clear that he would like to stop coumadin and start xarelto 20mg  daily at this time.  Ideally, should start beta blocker or diltiazem.  Given reactive airway disease, I would favor diltiazem.  Perhaps this can be started on return with Dr [name] (I am already starting xarelto and losartan today)  2. PTE Anticoagulation as above  3. Obesity Body mass index is 53.09 kg/(m^2). Weight loss strongly advised He is working diligently on this  4. HTN Given gout and prior reduced EF, will start losartan 25mg  daily.  This medicine has also been shown to reduce atrial fibrosis which may help afib. He will follow with PCP with bmet in 4 weeks.  5. Palpitations I have reviewed monitor tracings from 06/21/15 in epic.  I suspect that this was  either atach or very fast afib. I think that addition of diltiazem may be the best long term strategy. I also agree with Dr [name] that implantable loop recorder placement is very reasonable for afib management and further evaluation of palpitations.  Risks of implant discussed with patient.  He is very clear that he does not wish to have ILR placed at this time.  If he has additional palpitations, he may be more willing to proceed.  I am happy to assist at that time. I agree with Dr [name] that wearing additional 30 day monitors would not be beneficial.  6. CRI Would consider ultrasound of kidneys and further workup by PCP Avoid NSAIDs Add losartan 25mg  daily Follow-up with PCP  7. OSA Compliance with CPAP encouraged  Follow-up with Dr [name] as scheduled Follow-up with PCP as above I will see as needed going forward  Current medicines are reviewed at length with the patient today.   The patient does not have concerns regarding his medicines.  The following changes were made today:  none   Signed, Thompson Grayer, MD  09/07/2015 9:09 AM     [facility] [address] [place]  [address] [phone] (office) [phone] (fax)

## 2015-09-08 ENCOUNTER — Emergency Department (HOSPITAL_COMMUNITY): Payer: Managed Care, Other (non HMO)

## 2015-09-08 LAB — URINALYSIS, ROUTINE W REFLEX MICROSCOPIC
BILIRUBIN URINE: NEGATIVE
Glucose, UA: NEGATIVE mg/dL
Hgb urine dipstick: NEGATIVE
KETONES UR: NEGATIVE mg/dL
Leukocytes, UA: NEGATIVE
NITRITE: NEGATIVE
PROTEIN: NEGATIVE mg/dL
Specific Gravity, Urine: 1.014 (ref 1.005–1.030)
pH: 5.5 (ref 5.0–8.0)

## 2015-09-08 LAB — I-STAT CHEM 8, ED
BUN: 25 mg/dL — ABNORMAL HIGH (ref 6–20)
Calcium, Ion: 1.14 mmol/L (ref 1.12–1.23)
Chloride: 100 mmol/L — ABNORMAL LOW (ref 101–111)
Creatinine, Ser: 1.7 mg/dL — ABNORMAL HIGH (ref 0.61–1.24)
Glucose, Bld: 93 mg/dL (ref 65–99)
HEMATOCRIT: 38 % — AB (ref 39.0–52.0)
HEMOGLOBIN: 12.9 g/dL — AB (ref 13.0–17.0)
Potassium: 3.1 mmol/L — ABNORMAL LOW (ref 3.5–5.1)
SODIUM: 143 mmol/L (ref 135–145)
TCO2: 31 mmol/L (ref 0–100)

## 2015-09-08 MED ORDER — HYDROCODONE-ACETAMINOPHEN 5-325 MG PO TABS: 1.0000 | ORAL_TABLET | ORAL | Status: DC | PRN

## 2015-09-08 NOTE — ED Notes (Signed)
Pt returned from CT via stretcher.

## 2015-09-08 NOTE — ED Notes (Signed)
Patient transported to CT 

## 2015-09-08 NOTE — ED Provider Notes (Signed)
CSN: [id number]     Arrival date & time 09/07/15  2335 History   First MD Initiated Contact with Patient 09/08/15 0202     Chief Complaint  Patient presents with  . Flank Pain     (Consider location/radiation/quality/duration/timing/severity/associated sxs/prior Treatment) HPI Comments: Patient with a history of morbid obesity, OSA on CPAP, PE, Atrial Fibrillation on Xarelto, CHF, HTN presents with complaint of progressively worsening right flank pain for 3 weeks. No radiation of the pain, nausea, vomiting or fever. No urinary symptoms of hematuria, dysuria, or difficulty urinating. No abdominal pain. No injury. The pain is worse with certain movements but also present with rest. No SOB, or chest pain. No cough. He has a history of renal insufficiency.  Patient is a 55 y.o. male presenting with flank pain. The history is provided by the patient. No language interpreter was used.  Flank Pain This is a new problem. Pertinent negatives include no abdominal pain, chest pain, coughing, fever, myalgias, nausea, vomiting or weakness.    Past Medical History  Diagnosis Date  . Gout   . OSA on CPAP   . Morbid obesity ([place])   . Pulmonary embolism ([place])   . PNA (pneumonia)   . CHF (congestive heart failure) ([place]) 11/15    EF 40-45%, improved with follow-up  . Hypertensive cardiovascular disease   . Paroxysmal atrial fibrillation ([facility])   . Obstructive sleep apnea     complaint with CPAP   Past Surgical History  Procedure Laterality Date  . Umbilical hernia repair  1992   Family History  Problem Relation Age of Onset  . Diabetes Father   . CAD Father   . Hypertension Other   . Diabetes Other   . Allergies Brother    Social History  Substance Use Topics  . Smoking status: Former Smoker -- 0.25 packs/day for 1 years    Types: Cigarettes    Quit date: 07/23/1989  . Smokeless tobacco: Never Used  . Alcohol Use: No    Review of Systems  Constitutional: Negative for fever.   Respiratory: Negative for cough and shortness of breath.   Cardiovascular: Negative for chest pain.  Gastrointestinal: Negative for nausea, vomiting and abdominal pain.  Genitourinary: Positive for flank pain. Negative for dysuria, hematuria and difficulty urinating.  Musculoskeletal: Negative for myalgias.  Neurological: Negative for weakness.      Allergies  Review of patient's allergies indicates no known allergies.  Home Medications   Prior to Admission medications   Medication Sig Start Date End Date Taking? Authorizing Provider  albuterol (PROVENTIL HFA;VENTOLIN HFA) 108 (90 BASE) MCG/ACT inhaler Inhale 2 puffs into the lungs every 6 (six) hours as needed for shortness of breath. 05/09/14   Modena Jansky, MD  albuterol (PROVENTIL) (2.5 MG/3ML) 0.083% nebulizer solution Take 3 mLs (2.5 mg total) by nebulization every 4 (four) hours as needed for wheezing or shortness of breath. 07/08/15   Kelvin Cellar, MD  aspirin 81 MG tablet Take 81 mg by mouth at bedtime.    Historical Provider, MD  cloNIDine (CATAPRES) 0.1 MG tablet Take 1 tablet (0.1 mg total) by mouth 3 (three) times daily. 07/08/15   Kelvin Cellar, MD  colchicine 0.6 MG tablet Take 0.6 mg by mouth daily as needed (gout flare up.). For gout    Historical Provider, MD  fluticasone furoate-vilanterol (BREO ELLIPTA) 100-25 MCG/INH AEPB Inhale 1 puff into the lungs every morning. 07/24/15   Tanda Rockers, MD  hydrALAZINE (APRESOLINE) 50 MG tablet Take  50 mg by mouth 2 (two) times daily.    Historical Provider, MD  ibuprofen (ADVIL,MOTRIN) 200 MG tablet Take 200 mg by mouth every 6 (six) hours as needed for moderate pain.    Historical Provider, MD  losartan (COZAAR) 25 MG tablet Take 1 tablet (25 mg total) by mouth daily. 09/07/15   Thompson Grayer, MD  montelukast (SINGULAIR) 10 MG tablet Take 10 mg by mouth at bedtime.    Historical Provider, MD  rivaroxaban (XARELTO) 20 MG TABS tablet Take 1 tablet (20 mg total) by mouth daily  with supper. 09/07/15   Thompson Grayer, MD  torsemide (DEMADEX) 20 MG tablet Take 40 mg by mouth daily.    Historical Provider, MD   BP 184/86 mmHg  Pulse 72  Temp(Src) 98.2 F (36.8 C) (Oral)  Resp 20  SpO2 95% Physical Exam  Constitutional: He is oriented to person, place, and time. He appears well-developed and well-nourished.  Neck: Normal range of motion.  Cardiovascular: Normal rate.   Pulmonary/Chest: Effort normal.  Abdominal: Soft. There is no tenderness. There is no rebound and no guarding.  Genitourinary:  Right flank tenderness.   Musculoskeletal: Normal range of motion.  Neurological: He is alert and oriented to person, place, and time.  Skin: Skin is warm and dry.  Psychiatric: He has a normal mood and affect.    ED Course  Procedures (including critical care time) Labs Review Labs Reviewed  URINALYSIS, ROUTINE W REFLEX MICROSCOPIC (NOT AT [facility])   Results for orders placed or performed during the hospital encounter of 09/07/15  Urinalysis, Routine w reflex microscopic- may I&O cath if menses  Result Value Ref Range   Color, Urine YELLOW YELLOW   APPearance CLEAR CLEAR   Specific Gravity, Urine 1.014 1.005 - 1.030   pH 5.5 5.0 - 8.0   Glucose, UA NEGATIVE NEGATIVE mg/dL   Hgb urine dipstick NEGATIVE NEGATIVE   Bilirubin Urine NEGATIVE NEGATIVE   Ketones, ur NEGATIVE NEGATIVE mg/dL   Protein, ur NEGATIVE NEGATIVE mg/dL   Nitrite NEGATIVE NEGATIVE   Leukocytes, UA NEGATIVE NEGATIVE  I-stat Chem 8, ED  Result Value Ref Range   Sodium 143 135 - 145 mmol/L   Potassium 3.1 (L) 3.5 - 5.1 mmol/L   Chloride 100 (L) 101 - 111 mmol/L   BUN 25 (H) 6 - 20 mg/dL   Creatinine, Ser 1.70 (H) 0.61 - 1.24 mg/dL   Glucose, Bld 93 65 - 99 mg/dL   Calcium, Ion 1.14 1.12 - 1.23 mmol/L   TCO2 31 0 - 100 mmol/L   Hemoglobin 12.9 (L) 13.0 - 17.0 g/dL   HCT 38.0 (L) 39.0 - 52.0 %   Ct Renal Stone Study  09/08/2015  CLINICAL DATA:  Right flank pain for 3 weeks, increased today.  EXAM: CT ABDOMEN AND PELVIS WITHOUT CONTRAST TECHNIQUE: Multidetector CT imaging of the abdomen and pelvis was performed following the standard protocol without IV contrast. COMPARISON:  01/26/2013 FINDINGS: Lung bases are clear. The kidneys are symmetrical in size and shape. No hydronephrosis or hydroureter. No renal, ureteral, or bladder stones. Unenhanced appearance of the liver, spleen, gallbladder, pancreas, adrenal glands, abdominal aorta, inferior vena cava, and retroperitoneal lymph nodes is unremarkable. Stomach and small bowel are decompressed. Stool-filled colon without abnormal distention. No free air or free fluid in the abdomen. Pelvis: Prostate gland is not enlarged. Bladder wall is not thickened. No free or loculated pelvic fluid collections. No pelvic mass or lymphadenopathy. The appendix is normal. Degenerative changes in the  spine. IMPRESSION: No renal or ureteral stone or obstruction. Electronically Signed   By: Lucienne Capers M.D.   On: 09/08/2015 03:06    Imaging Review No results found. I have personally reviewed and evaluated these images and lab results as part of my medical decision-making.   EKG Interpretation None      MDM   Final diagnoses:  None    1. Right flank pain  Patient with right flank pain x 3 weeks, worse today. No fever, hematuria or history of kidney stones. UA negative for evidence of hematuria or infection. Renal function at baseline. Mild hypokalemia. Pain is some worse with movement. Will treat for muscular pain and encourage PCP follow up for recheck.    Charlann Lange, PA-C 09/08/15 0345  Veryl Speak, MD 09/08/15 [phone]

## 2015-09-08 NOTE — Discharge Instructions (Signed)
Flank Pain °Flank pain refers to pain that is located on the side of the body between the upper abdomen and the back. The pain may occur over a short period of time (acute) or may be long-term or reoccurring (chronic). It may be mild or severe. Flank pain can be caused by many things. °CAUSES  °Some of the more common causes of flank pain include: °· Muscle strains.   °· Muscle spasms.   °· A disease of your spine (vertebral disk disease).   °· A lung infection (pneumonia).   °· Fluid around your lungs (pulmonary edema).   °· A kidney infection.   °· Kidney stones.   °· A very painful skin rash caused by the chickenpox virus (shingles).   °· Gallbladder disease.   °HOME CARE INSTRUCTIONS  °Home care will depend on the cause of your pain. In general, °· Rest as directed by your caregiver. °· Drink enough fluids to keep your urine clear or pale yellow. °· Only take over-the-counter or prescription medicines as directed by your caregiver. Some medicines may help relieve the pain. °· Tell your caregiver about any changes in your pain. °· Follow up with your caregiver as directed. °SEEK IMMEDIATE MEDICAL CARE IF:  °· Your pain is not controlled with medicine.   °· You have new or worsening symptoms. °· Your pain increases.   °· You have abdominal pain.   °· You have shortness of breath.   °· You have persistent nausea or vomiting.   °· You have swelling in your abdomen.   °· You feel faint or pass out.   °· You have blood in your urine. °· You have a fever or persistent symptoms for more than 2-3 days. °· You have a fever and your symptoms suddenly get worse. °MAKE SURE YOU:  °· Understand these instructions. °· Will watch your condition. °· Will get help right away if you are not doing well or get worse. °  °This information is not intended to replace advice given to you by your health care provider. Make sure you discuss any questions you have with your health care provider. °  °Document Released: 05/01/2005 Document  Revised: 12/03/2011 Document Reviewed: 10/23/2011 °Elsevier Interactive Patient Education ©2016 [employer] ° °

## 2015-09-08 NOTE — ED Notes (Signed)
Notified EDP,Delo,MD., pt. i-stat Chem 8 results potassium 3.1 and hemogloblin 12.9 and RN,Autumn made aware.

## 2015-09-10 ENCOUNTER — Encounter: Payer: Self-pay | Admitting: Internal Medicine

## 2015-09-10 ENCOUNTER — Ambulatory Visit (INDEPENDENT_AMBULATORY_CARE_PROVIDER_SITE_OTHER): Payer: Managed Care, Other (non HMO) | Admitting: Internal Medicine

## 2015-09-10 VITALS — BP 148/90 | HR 74 | Ht 70.0 in | Wt 372.0 lb

## 2015-09-10 DIAGNOSIS — J4530 Mild persistent asthma, uncomplicated: Secondary | ICD-10-CM | POA: Diagnosis not present

## 2015-09-10 DIAGNOSIS — J40 Bronchitis, not specified as acute or chronic: Secondary | ICD-10-CM | POA: Diagnosis not present

## 2015-09-10 LAB — NITRIC OXIDE: Nitric Oxide: 17

## 2015-09-10 NOTE — Progress Notes (Signed)
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 and maintained that wt of < 260 up until retired in 1999 with smoke exp in [place] at "79% lung capacity" per pulmonologist on prn saba with progressive wt gain since then assoc with progessive doe so self referred to pulmonary clinic 07/24/2015 sp admit:   Admit date: 07/04/2015 Discharge date: 07/08/2015   Discharge Diagnoses:  Principal Problem:  Influenza with pneumonia Active Problems:  OSA on CPAP  Hypokalemia  Essential hypertension with nl echo 06/21/15   Elevated troponin  New rx: neb albuterol/ maint on coreg   07/24/2015 1st [facility] office visit/ [facility]   Chief Complaint  Patient presents with  . Pulmonary Consult    self ref.-sob with exertion x 5 yrs.,wt. gain,[facility] last mth. with flu/pneumonia/CHF,cough-yellow,occass. wheezing,Using ventolin 1 x this wk.has neb. machine used 2x last wk.,midchest tightness,  [facility] wt 429 at [facility] with pna and breathing improved since then  Back to baseline = 100 yards s stopping and be tired at slower [facility] = can't walk a nl pace on a flat grade s sob but does fine slow and flat eg shopping ok/ crosses parking lot  Can't go up steps at all s stopping multiple times  Sleeping fine on cpap per PC Has breo but prefers to use saba  rec Plan A = Automatic = BREO  Take one click each am - one or two smooth deep drags  Plan B = Backup Only use your albuterol as a rescue medication to be used if you can't catch your breath by resting or doing a relaxed purse lip breathing pattern.  - The less you use it, the better it will work when you need it. - Ok to use the inhaler up to 2 puffs  every 4 hours if you must but call for appointment if use goes up over your usual need - Don't leave home without it !!  (think of it like the spare tire for your car)  Plan  C = Crisis - only use your albuterol nebulizer if you first try Plan B and it fails to help > ok to use the nebulizer up to every 4 hours but if start needing it regularly call for immediate appointment Please schedule a follow up office visit in 6 weeks, call sooner if needed with all inhalers in hand     09/10/2015  f/u ov/[facility] re:  Morbid obesity ? Asthma just on singulair and  prn saba  - no longer on coreg Chief Complaint  Patient presents with  . Follow-up    Pt states that his breathing is much improved. He has not used breo or albuterol for the past 3 wks.   improving ex tol / on NOAC for paf and h/o dvt L    No obvious day to day or daytime variability or assoc excess/ purulent sputum or mucus plugs   or cp or chest tightness, subjective wheeze or overt sinus or hb symptoms. No unusual exp hx or h/o childhood pna/ asthma or knowledge of premature birth.  Sleeping ok without nocturnal  or early am exacerbation  of respiratory  c/o's or need for noct saba. Also denies any obvious fluctuation of symptoms with weather or environmental changes or other aggravating or alleviating factors except  as outlined above   Current Medications, Allergies, Complete Past Medical History, Past Surgical History, Family History, and Social History were reviewed in [employer] record.  ROS  The following are not active complaints unless bolded sore throat, dysphagia, dental problems, itching, sneezing,  nasal congestion or excess/ purulent secretions, ear ache,   fever, chills, sweats, unintended wt loss, classically pleuritic or exertional cp, hemoptysis,  orthopnea pnd or leg swelling, presyncope, palpitations, abdominal pain, anorexia, nausea, vomiting, diarrhea  or change in bowel or bladder habits, change in stools or urine, dysuria,hematuria,  rash, arthralgias, visual complaints, headache, numbness, weakness or ataxia or problems with walking or coordination,  change in  mood/affect or memory.            Objective:   Physical Exam    massively obese bm nad   09/10/2015       372   07/24/15 389 lb 6.4 oz (176.631 kg)  07/08/15 384 lb 3.2 oz (174.272 kg)  06/21/15 390 lb 8 oz (177.13 kg)    Vital signs reviewed  HEENT: nl dentition, turbinates, and oropharynx. Nl external ear canals without cough reflex   NECK :  without JVD/Nodes/TM/ nl carotid upstrokes bilaterally   LUNGS: no acc muscle use,  Nl contour chest which is clear to A and P bilaterally without cough on insp or exp maneuvers   CV:  RRR  no s3 or murmur or increase in P2, no edema   ABD:  soft and nontender with nl inspiratory excursion in the supine position. No bruits or organomegaly, bowel sounds nl  MS:  Nl gait/ ext warm without deformities, calf tenderness, cyanosis or clubbing No obvious joint restrictions   SKIN: warm and dry without lesions    NEURO:  alert, approp, nl sensorium with  no motor deficits      I personally reviewed images and agree with radiology impression as follows:  CXR:  07/06/15 No edema or consolidation.    I personally reviewed images and agree with radiology impression as follows:  CT Chest   06/20/15 Negative exam. Limited detail because of patient's size. The lungs are clear. No pulmonary emboli seen.    Assessment:

## 2015-09-10 NOTE — Patient Instructions (Addendum)
Weight control is simply a matter of calorie balance which needs to be tilted in your favor by eating less and exercising more.  To get the most out of exercise, you need to be continuously aware that you are short of breath, but never out of breath, for 30 minutes daily. As you improve, it will actually be easier for you to do the same amount of exercise  in  30 minutes so always push to the level where you are short of breath.  If this does not result in gradual weight reduction then I strongly recommend you see a nutritionist with a food diary x 2 weeks so that we can work out a negative calorie balance which is universally effective in steady weight loss programs.  Think of your calorie balance like you do your bank account where in this case you want the balance to go down so you must take in less calories than you burn up.  It's just that simple:  Hard to do, but easy to understand.  Good luck!   If breathing starts getting worse or need more than twice weekly albuterol, resume the BREO one click each am  Continue daily singulair (montelukast)   Please schedule a follow up visit in 3 months but call sooner if needed with pfts on return

## 2015-09-13 ENCOUNTER — Encounter: Payer: Self-pay | Admitting: Internal Medicine

## 2015-09-13 NOTE — Assessment & Plan Note (Signed)
Body mass index is 53.38 trending down now/ encouraged to continue  Lab Results  Component Value Date   TSH 1.724 05/07/2014     Contributing to gerd tendency/ doe/reviewed the need and the process to achieve and maintain neg calorie balance > defer f/u primary care including intermittently monitoring thyroid status

## 2015-09-13 NOTE — Assessment & Plan Note (Signed)
singulair maint rx as of 09/10/2015 with NO = 31 / no saba or other controller needed now that he's off BB   All goals of chronic asthma control met including optimal function and elimination of symptoms with minimal need for rescue therapy.  Contingencies discussed in full including contacting this office immediately if not controlling the symptoms using the rule of two's.     I had an extended discussion with the patient reviewing all relevant studies completed to date and  lasting 15 to 20 minutes of a 25 minute visit    Each maintenance medication was reviewed in detail including most importantly the difference between maintenance and prns and under what circumstances the prns are to be triggered using an action plan format that is not reflected in the computer generated alphabetically organized AVS.    Please see instructions for details which were reviewed in writing and the patient given a copy highlighting the part that I personally wrote and discussed at today's ov.

## 2015-10-10 NOTE — Progress Notes (Signed)
Cardiology Office Note    Date:  10/11/2015   ID:  Phillip, [name] 30-May-1960, MRN [id number]  PCP:  [name], MD  Cardiologist: Sinclair Grooms, MD   Chief Complaint  Patient presents with  . Atrial Fibrillation  . Shortness of Breath    History of Present Illness:  Phillip [name] is a 55 y.o. male for follow-up of paroxysmal atrial fibrillation (identified on thirty-day continuous monitor), remote history of pulmonary embolism, history of DVT, obstructive sleep apnea, morbid obesity, hypertension, and chronic combined systolic and diastolic heart failure with EF 40-45%.  The patient is doing well. He feels extremely tired today because he has been working for 24 hours straight. He denies any prolonged episodes of palpitation. He has an occasional flutter that resolved instantaneously. He decided against loop recorder. He decided against any attempted ablative procedure.  He has sleep apnea and wears a Pap.  Past Medical History  Diagnosis Date  . Gout   . OSA on CPAP   . Morbid obesity ([place])   . Pulmonary embolism ([place])   . PNA (pneumonia)   . CHF (congestive heart failure) ([place]) 11/15    EF 40-45%, improved with follow-up  . Hypertensive cardiovascular disease   . Paroxysmal atrial fibrillation (HCC)   . Obstructive sleep apnea     complaint with CPAP    Past Surgical History  Procedure Laterality Date  . Umbilical hernia repair  1992    Current Medications: Outpatient Prescriptions Prior to Visit  Medication Sig Dispense Refill  . albuterol (PROVENTIL HFA;VENTOLIN HFA) 108 (90 BASE) MCG/ACT inhaler Inhale 2 puffs into the lungs every 6 (six) hours as needed for shortness of breath. 18 g 0  . albuterol (PROVENTIL) (2.5 MG/3ML) 0.083% nebulizer solution Take 3 mLs (2.5 mg total) by nebulization every 4 (four) hours as needed for wheezing or shortness of breath. 75 mL 12  . aspirin 81 MG tablet Take 81 mg by mouth at bedtime.    . cloNIDine (CATAPRES) 0.1 MG  tablet Take 1 tablet (0.1 mg total) by mouth 3 (three) times daily. 60 tablet 11  . colchicine 0.6 MG tablet Take 0.6 mg by mouth daily as needed (gout flare up.). For gout    . fluticasone furoate-vilanterol (BREO ELLIPTA) 100-25 MCG/INH AEPB Inhale 1 puff into the lungs every morning.    . hydrALAZINE (APRESOLINE) 50 MG tablet Take 50 mg by mouth 2 (two) times daily.    [name] HYDROcodone-acetaminophen (NORCO/VICODIN) 5-325 MG tablet Take 1-2 tablets by mouth every 4 (four) hours as needed. 12 tablet 0  . ibuprofen (ADVIL,MOTRIN) 200 MG tablet Take 200 mg by mouth every 6 (six) hours as needed for moderate pain.    [name] losartan (COZAAR) 25 MG tablet Take 1 tablet (25 mg total) by mouth daily. 90 tablet 3  . montelukast (SINGULAIR) 10 MG tablet Take 10 mg by mouth at bedtime.    . rivaroxaban (XARELTO) 20 MG TABS tablet Take 1 tablet (20 mg total) by mouth daily with supper. 30 tablet 11  . torsemide (DEMADEX) 20 MG tablet Take 40 mg by mouth daily.     No facility-administered medications prior to visit.     Allergies:   Review of patient's allergies indicates no known allergies.   Social History   Social History  . Marital Status: Divorced    Spouse Name: N/A  . Number of Children: N/A  . Years of Education: N/A   Occupational History  .  disabled, drove a dump truck    Social History Main Topics  . Smoking status: Former Smoker -- 0.25 packs/day for 1 years    Types: Cigarettes    Quit date: 07/23/1989  . Smokeless tobacco: Never Used  . Alcohol Use: No  . Drug Use: No  . Sexual Activity: Not Currently   Other Topics Concern  . None   Social History Narrative   Pt lives in [place], alone.   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   Has 2 boys.     Family History:  The patient's family history includes Allergies in his brother; CAD in his father; Diabetes in his father and other; Hypertension in his other.   ROS:   Please see the history of  present illness.    Physically inactive.  All other systems reviewed and are negative.   PH Immobility due to musculoskeletal discomfort. He advocates sleep apnea useYSICAL EXAM:   VS:  BP 160/96 mmHg  Pulse 92  Ht 5\' 10"  (1.778 m)  Wt 377 lb 12.8 oz (171.369 kg)  BMI 54.21 kg/m2   GEN: Well nourished, well developed, in no acute distress. Morbidly obese.  HEENT: normal Neck: no JVD, carotid bruits, or masses Cardiac: RRR; no murmurs, rubs, or gallops. Trace bilateral ankle edema . Respiratory:  clear to auscultation bilaterally, normal work of breathing GI: soft, nontender, nondistended, + BS MS: no deformity or atrophy Skin: warm and dry, no rash Neuro:  Alert and Oriented x 3, Strength and sensation are intact Psych: euthymic mood, full affect  Wt Readings from Last 3 Encounters:  10/11/15 377 lb 12.8 oz (171.369 kg)  09/10/15 372 lb (168.738 kg)  09/07/15 370 lb (167.831 kg)      Studies/Labs Reviewed:   EKG:  EKG  Is not repeated  Recent Labs: 06/21/2015: Magnesium 2.1 07/04/2015: B Natriuretic Peptide 321.1* 07/07/2015: ALT 21; Platelets 135* 09/08/2015: BUN 25*; Creatinine, Ser 1.70*; Hemoglobin 12.9*; Potassium 3.1*; Sodium 143   Lipid Panel    Component Value Date/Time   CHOL 175 02/21/2015 0431   TRIG 37 02/21/2015 0431   HDL 66 02/21/2015 0431   CHOLHDL 2.7 02/21/2015 0431   VLDL 7 02/21/2015 0431   LDLCALC 102* 02/21/2015 0431    Additional studies/ records that were reviewed today include:  No new data    ASSESSMENT:    1. Paroxysmal atrial fibrillation (HCC)   2. Chronic combined systolic and diastolic CHF (congestive heart failure) ([place])   3. Essential hypertension   4. Morbid (severe) obesity due to excess calories ([place])   5. Long term (current) use of anticoagulants [Z79.01]      PLAN:  In order of problems listed above:   No clinically significant recurrent episodes of atrial fibrillation or tachycardia.  No gross evidence of volume  overload. Lungs are clear.  Poor control however the patient is been up for 24 consecutive hours and as of the end of his dosing for hypertension. He will call take his medication and get some rest today.  We again discussed increasing aerobic activity and decreasing caloric intake for weight loss.  No complications on Xarelto.    Medication Adjustments/Labs and Tests Ordered: Current medicines are reviewed at length with the patient today.  Concerns regarding medicines are outlined above.  Medication changes, Labs and Tests ordered today are listed in the Patient Instructions below. Patient Instructions  Medication Instructions:  None  Labwork: None  Testing/Procedures: None  Follow-Up: Your physician wants you to follow-up in: 6-8 months with Dr. [name]. You will receive a reminder letter in the mail two months in advance. If you don't receive a letter, please call our office to schedule the follow-up appointment.   Any Other Special Instructions Will Be Listed Below (If Applicable).  Low-Sodium Eating Plan Sodium raises blood pressure and causes water to be held in the body. Getting less sodium from food will help lower your blood pressure, reduce any swelling, and protect your heart, liver, and kidneys. We get sodium by adding salt (sodium chloride) to food. Most of our sodium comes from canned, boxed, and frozen foods. Restaurant foods, fast foods, and pizza are also very high in sodium. Even if you take medicine to lower your blood pressure or to reduce fluid in your body, getting less sodium from your food is important. WHAT IS MY PLAN? Most people should limit their sodium intake to 2,300 mg a day. Your health care provider recommends that you limit your sodium intake to ___2g= 2,000mg _______ a day.  WHAT DO I NEED TO KNOW ABOUT THIS EATING PLAN? For the low-sodium eating plan, you will follow these general guidelines:  Choose foods with a % Daily Value for sodium of less  than 5% (as listed on the food label).   Use salt-free seasonings or herbs instead of table salt or sea salt.   Check with your health care provider or pharmacist before using salt substitutes.   Eat fresh foods.  Eat more vegetables and fruits.  Limit canned vegetables. If you do use them, rinse them well to decrease the sodium.   Limit cheese to 1 oz (28 g) per day.   Eat lower-sodium products, often labeled as "lower sodium" or "no salt added."  Avoid foods that contain monosodium glutamate (MSG). MSG is sometimes added to [place] food and some canned foods.  Check food labels (Nutrition Facts labels) on foods to learn how much sodium is in one serving.  Eat more home-cooked food and less restaurant, buffet, and fast food.  When eating at a restaurant, ask that your food be prepared with less salt, or no salt if possible.  HOW DO I READ FOOD LABELS FOR SODIUM INFORMATION? The Nutrition Facts label lists the amount of sodium in one serving of the food. If you eat more than one serving, you must multiply the listed amount of sodium by the number of servings. Food labels may also identify foods as:  Sodium free--Less than 5 mg in a serving.  Very low sodium--35 mg or less in a serving.  Low sodium--140 mg or less in a serving.  Light in sodium--50% less sodium in a serving. For example, if a food that usually has 300 mg of sodium is changed to become light in sodium, it will have 150 mg of sodium.  Reduced sodium--25% less sodium in a serving. For example, if a food that usually has 400 mg of sodium is changed to reduced sodium, it will have 300 mg of sodium. WHAT FOODS CAN I EAT? Grains Low-sodium cereals, including oats, puffed wheat and rice, and shredded wheat cereals. Low-sodium crackers. Unsalted rice and pasta. Lower-sodium bread.  Vegetables Frozen or fresh vegetables. Low-sodium or reduced-sodium canned vegetables. Low-sodium or reduced-sodium tomato sauce  and paste. Low-sodium or reduced-sodium tomato and vegetable juices.  Fruits Fresh, frozen, and canned fruit. Fruit juice.  Meat and Other Protein Products Low-sodium canned tuna and salmon. Fresh or frozen meat, poultry, seafood, and fish.  Lamb. Unsalted nuts. Dried beans, peas, and lentils without added salt. Unsalted canned beans. Homemade soups without salt. Eggs.  Dairy Milk. Soy milk. Ricotta cheese. Low-sodium or reduced-sodium cheeses. Yogurt.  Condiments Fresh and dried herbs and spices. Salt-free seasonings. Onion and garlic powders. Low-sodium varieties of mustard and ketchup. Fresh or refrigerated horseradish. Lemon juice.  Fats and Oils Reduced-sodium salad dressings. Unsalted butter.  Other Unsalted popcorn and pretzels.  The items listed above may not be a complete list of recommended foods or beverages. Contact your dietitian for more options. WHAT FOODS ARE NOT RECOMMENDED? Grains Instant hot cereals. Bread stuffing, pancake, and biscuit mixes. Croutons. Seasoned rice or pasta mixes. Noodle soup cups. Boxed or frozen macaroni and cheese. Self-rising flour. Regular salted crackers. Vegetables Regular canned vegetables. Regular canned tomato sauce and paste. Regular tomato and vegetable juices. Frozen vegetables in sauces. Salted [place] fries. Olives. Angie Fava. Relishes. Sauerkraut. Salsa. Meat and Other Protein Products Salted, canned, smoked, spiced, or pickled meats, seafood, or fish. Bacon, ham, sausage, hot dogs, corned beef, chipped beef, and packaged luncheon meats. Salt pork. Jerky. Pickled herring. Anchovies, regular canned tuna, and sardines. Salted nuts. Dairy Processed cheese and cheese spreads. Cheese curds. Blue cheese and cottage cheese. Buttermilk.  Condiments Onion and garlic salt, seasoned salt, table salt, and sea salt. Canned and packaged gravies. Worcestershire sauce. Tartar sauce. Barbecue sauce. Teriyaki sauce. Soy sauce, including reduced  sodium. Steak sauce. Fish sauce. Oyster sauce. Cocktail sauce. Horseradish that you find on the shelf. Regular ketchup and mustard. Meat flavorings and tenderizers. Bouillon cubes. Hot sauce. Tabasco sauce. Marinades. Taco seasonings. Relishes. Fats and Oils Regular salad dressings. Salted butter. Margarine. Ghee. Bacon fat.  Other Potato and tortilla chips. Corn chips and puffs. Salted popcorn and pretzels. Canned or dried soups. Pizza. Frozen entrees and pot pies.  The items listed above may not be a complete list of foods and beverages to avoid. Contact your dietitian for more information.   This information is not intended to replace advice given to you by your health care provider. Make sure you discuss any questions you have with your health care provider.   Document Released: 08/30/2001 Document Revised: 03/31/2014 Document Reviewed: 01/12/2013 Elsevier Interactive Patient Education [employer].    If you need a refill on your cardiac medications before your next appointment, please call your pharmacy.       Signed, Sinclair Grooms, MD  10/11/2015 10:52 AM    [facility] [address], [place], [place]  [address] Phone: [phone]; Fax: [phone]

## 2015-10-11 ENCOUNTER — Ambulatory Visit: Payer: Self-pay | Admitting: Interventional Cardiology

## 2015-10-11 ENCOUNTER — Encounter: Payer: Self-pay | Admitting: Interventional Cardiology

## 2015-10-11 ENCOUNTER — Ambulatory Visit (INDEPENDENT_AMBULATORY_CARE_PROVIDER_SITE_OTHER): Payer: Managed Care, Other (non HMO) | Admitting: Interventional Cardiology

## 2015-10-11 VITALS — BP 160/96 | HR 92 | Ht 70.0 in | Wt 377.8 lb

## 2015-10-11 DIAGNOSIS — I480 Paroxysmal atrial fibrillation: Secondary | ICD-10-CM | POA: Diagnosis not present

## 2015-10-11 DIAGNOSIS — I10 Essential (primary) hypertension: Secondary | ICD-10-CM

## 2015-10-11 DIAGNOSIS — Z7901 Long term (current) use of anticoagulants: Secondary | ICD-10-CM

## 2015-10-11 DIAGNOSIS — I5042 Chronic combined systolic (congestive) and diastolic (congestive) heart failure: Secondary | ICD-10-CM | POA: Diagnosis not present

## 2015-10-11 NOTE — Patient Instructions (Signed)
Medication Instructions:  None  Labwork: None  Testing/Procedures: None  Follow-Up: Your physician wants you to follow-up in: 6-8 months with Dr. [name]. You will receive a reminder letter in the mail two months in advance. If you don't receive a letter, please call our office to schedule the follow-up appointment.   Any Other Special Instructions Will Be Listed Below (If Applicable).  Low-Sodium Eating Plan Sodium raises blood pressure and causes water to be held in the body. Getting less sodium from food will help lower your blood pressure, reduce any swelling, and protect your heart, liver, and kidneys. We get sodium by adding salt (sodium chloride) to food. Most of our sodium comes from canned, boxed, and frozen foods. Restaurant foods, fast foods, and pizza are also very high in sodium. Even if you take medicine to lower your blood pressure or to reduce fluid in your body, getting less sodium from your food is important. WHAT IS MY PLAN? Most people should limit their sodium intake to 2,300 mg a day. Your health care provider recommends that you limit your sodium intake to ___2g= 2,000mg _______ a day.  WHAT DO I NEED TO KNOW ABOUT THIS EATING PLAN? For the low-sodium eating plan, you will follow these general guidelines:  Choose foods with a % Daily Value for sodium of less than 5% (as listed on the food label).   Use salt-free seasonings or herbs instead of table salt or sea salt.   Check with your health care provider or pharmacist before using salt substitutes.   Eat fresh foods.  Eat more vegetables and fruits.  Limit canned vegetables. If you do use them, rinse them well to decrease the sodium.   Limit cheese to 1 oz (28 g) per day.   Eat lower-sodium products, often labeled as "lower sodium" or "no salt added."  Avoid foods that contain monosodium glutamate (MSG). MSG is sometimes added to [place] food and some canned foods.  Check food labels (Nutrition  Facts labels) on foods to learn how much sodium is in one serving.  Eat more home-cooked food and less restaurant, buffet, and fast food.  When eating at a restaurant, ask that your food be prepared with less salt, or no salt if possible.  HOW DO I READ FOOD LABELS FOR SODIUM INFORMATION? The Nutrition Facts label lists the amount of sodium in one serving of the food. If you eat more than one serving, you must multiply the listed amount of sodium by the number of servings. Food labels may also identify foods as:  Sodium free--Less than 5 mg in a serving.  Very low sodium--35 mg or less in a serving.  Low sodium--140 mg or less in a serving.  Light in sodium--50% less sodium in a serving. For example, if a food that usually has 300 mg of sodium is changed to become light in sodium, it will have 150 mg of sodium.  Reduced sodium--25% less sodium in a serving. For example, if a food that usually has 400 mg of sodium is changed to reduced sodium, it will have 300 mg of sodium. WHAT FOODS CAN I EAT? Grains Low-sodium cereals, including oats, puffed wheat and rice, and shredded wheat cereals. Low-sodium crackers. Unsalted rice and pasta. Lower-sodium bread.  Vegetables Frozen or fresh vegetables. Low-sodium or reduced-sodium canned vegetables. Low-sodium or reduced-sodium tomato sauce and paste. Low-sodium or reduced-sodium tomato and vegetable juices.  Fruits Fresh, frozen, and canned fruit. Fruit juice.  Meat and Other Protein Products Low-sodium canned tuna  and salmon. Fresh or frozen meat, poultry, seafood, and fish. Lamb. Unsalted nuts. Dried beans, peas, and lentils without added salt. Unsalted canned beans. Homemade soups without salt. Eggs.  Dairy Milk. Soy milk. Ricotta cheese. Low-sodium or reduced-sodium cheeses. Yogurt.  Condiments Fresh and dried herbs and spices. Salt-free seasonings. Onion and garlic powders. Low-sodium varieties of mustard and ketchup. Fresh or  refrigerated horseradish. Lemon juice.  Fats and Oils Reduced-sodium salad dressings. Unsalted butter.  Other Unsalted popcorn and pretzels.  The items listed above may not be a complete list of recommended foods or beverages. Contact your dietitian for more options. WHAT FOODS ARE NOT RECOMMENDED? Grains Instant hot cereals. Bread stuffing, pancake, and biscuit mixes. Croutons. Seasoned rice or pasta mixes. Noodle soup cups. Boxed or frozen macaroni and cheese. Self-rising flour. Regular salted crackers. Vegetables Regular canned vegetables. Regular canned tomato sauce and paste. Regular tomato and vegetable juices. Frozen vegetables in sauces. Salted [place] fries. Olives. Angie Fava. Relishes. Sauerkraut. Salsa. Meat and Other Protein Products Salted, canned, smoked, spiced, or pickled meats, seafood, or fish. Bacon, ham, sausage, hot dogs, corned beef, chipped beef, and packaged luncheon meats. Salt pork. Jerky. Pickled herring. Anchovies, regular canned tuna, and sardines. Salted nuts. Dairy Processed cheese and cheese spreads. Cheese curds. Blue cheese and cottage cheese. Buttermilk.  Condiments Onion and garlic salt, seasoned salt, table salt, and sea salt. Canned and packaged gravies. Worcestershire sauce. Tartar sauce. Barbecue sauce. Teriyaki sauce. Soy sauce, including reduced sodium. Steak sauce. Fish sauce. Oyster sauce. Cocktail sauce. Horseradish that you find on the shelf. Regular ketchup and mustard. Meat flavorings and tenderizers. Bouillon cubes. Hot sauce. Tabasco sauce. Marinades. Taco seasonings. Relishes. Fats and Oils Regular salad dressings. Salted butter. Margarine. Ghee. Bacon fat.  Other Potato and tortilla chips. Corn chips and puffs. Salted popcorn and pretzels. Canned or dried soups. Pizza. Frozen entrees and pot pies.  The items listed above may not be a complete list of foods and beverages to avoid. Contact your dietitian for more information.   This  information is not intended to replace advice given to you by your health care provider. Make sure you discuss any questions you have with your health care provider.   Document Released: 08/30/2001 Document Revised: 03/31/2014 Document Reviewed: 01/12/2013 Elsevier Interactive Patient Education [employer].    If you need a refill on your cardiac medications before your next appointment, please call your pharmacy.

## 2015-10-15 ENCOUNTER — Emergency Department (HOSPITAL_COMMUNITY): Payer: Managed Care, Other (non HMO)

## 2015-10-15 ENCOUNTER — Emergency Department (HOSPITAL_COMMUNITY)
Admission: EM | Admit: 2015-10-15 | Discharge: 2015-10-16 | Disposition: A | Discharge status: Home / Self Care | Payer: Managed Care, Other (non HMO) | Attending: Emergency Medicine | Admitting: Emergency Medicine

## 2015-10-15 DIAGNOSIS — Z7982 Long term (current) use of aspirin: Secondary | ICD-10-CM | POA: Insufficient documentation

## 2015-10-15 DIAGNOSIS — I110 Hypertensive heart disease with heart failure: Secondary | ICD-10-CM | POA: Diagnosis not present

## 2015-10-15 DIAGNOSIS — R0789 Other chest pain: Secondary | ICD-10-CM | POA: Insufficient documentation

## 2015-10-15 DIAGNOSIS — Z7951 Long term (current) use of inhaled steroids: Secondary | ICD-10-CM | POA: Insufficient documentation

## 2015-10-15 DIAGNOSIS — I480 Paroxysmal atrial fibrillation: Secondary | ICD-10-CM | POA: Diagnosis not present

## 2015-10-15 DIAGNOSIS — Z6841 Body Mass Index (BMI) 40.0 and over, adult: Secondary | ICD-10-CM | POA: Insufficient documentation

## 2015-10-15 DIAGNOSIS — I5042 Chronic combined systolic (congestive) and diastolic (congestive) heart failure: Secondary | ICD-10-CM | POA: Diagnosis not present

## 2015-10-15 DIAGNOSIS — Z79899 Other long term (current) drug therapy: Secondary | ICD-10-CM | POA: Diagnosis not present

## 2015-10-15 DIAGNOSIS — Z87891 Personal history of nicotine dependence: Secondary | ICD-10-CM | POA: Diagnosis not present

## 2015-10-15 DIAGNOSIS — R0602 Shortness of breath: Secondary | ICD-10-CM | POA: Insufficient documentation

## 2015-10-15 DIAGNOSIS — Z791 Long term (current) use of non-steroidal anti-inflammatories (NSAID): Secondary | ICD-10-CM | POA: Diagnosis not present

## 2015-10-15 NOTE — ED Notes (Signed)
Bed: [facility]2 Expected date:  Expected time:  Means of arrival:  Comments: Hold for clorox

## 2015-10-15 NOTE — ED Triage Notes (Signed)
Pt states that he woke up this morning after using his CPAP and started having chest tightness and wheezing. Took home nebs and states he feels fine now but wanted too be checked out. Alert and oriented.

## 2015-10-16 LAB — I-STAT TROPONIN, ED: TROPONIN I, POC: 0.04 ng/mL (ref 0.00–0.08)

## 2015-10-16 NOTE — ED Provider Notes (Signed)
[place] DEPT Provider Note   CSN: [id number] Arrival date & time: 10/15/15  2203  First Provider Contact:  First MD Initiated Contact with Patient 10/16/15 0535        History   Chief Complaint Chief Complaint  Patient presents with  . Chest Pain    HPI Phillip [name] is a 55 y.o. male with a history of CHF, asthma and morbid obesity. He is here with chest pain that began yesterday around noon. The pain was located along the left anterior axillary line, was well localized, is described as dull and was mild in severity. The pain was episodic lasting several minutes at a time. The pain was not triggered nor relieved by any known factor. He did have some radiation to the shoulder. He has not had any pains since yesterday evening. He also developed dyspnea on exertion yesterday. He self-administered an albuterol neb treatment and has been asymptomatic since. He denies nausea, vomiting or diaphoresis.  HPI    Past Medical History:  Diagnosis Date  . CHF (congestive heart failure) ([place]) 11/15   EF 40-45%, improved with follow-up  . Gout   . Hypertensive cardiovascular disease   . Morbid obesity ([place])   . Obstructive sleep apnea    complaint with CPAP  . OSA on CPAP   . Paroxysmal atrial fibrillation ([facility])   . PNA (pneumonia)   . Pulmonary embolism [facility])     Patient Active Problem List   Diagnosis Date Noted  . Atrial fibrillation ([place]) [I48.91] 08/17/2015  . Long term (current) use of anticoagulants [Z79.01] 08/17/2015  . Hx pulmonary embolism 08/17/2015  . DVT (deep venous thrombosis) ([place]) 08/17/2015  . Deep vein thrombosis (DVT) of lower extremity ([place]) 08/17/2015  . Paroxysmal atrial fibrillation ([place]) 08/04/2015  . HCAP (healthcare-associated pneumonia) 07/04/2015  . SVT (supraventricular tachycardia) ([place]) 06/22/2015  . Acute on chronic respiratory failure with hypoxia ([place]) 06/21/2015  . Morbid (severe) obesity due to excess calories ([place]) 03/07/2015  . Chronic  combined systolic and diastolic CHF (congestive heart failure) ([place]) 03/07/2015  . Acute respiratory failure ([place]) 02/20/2015  . Essential hypertension   . Gout   . CAP (community acquired pneumonia) 05/07/2014  . Mild persistent asthma in adult without complication [id number]  . OSA on CPAP 05/06/2014  . Hypoxia 05/06/2014  . Acute renal failure superimposed on stage 3 chronic kidney disease ([place]) 05/06/2014  . Asthma exacerbation 05/06/2014    Past Surgical History:  Procedure Laterality Date  . UMBILICAL HERNIA REPAIR  1992       Home Medications    Prior to Admission medications   Medication Sig Start Date End Date Taking? Authorizing Provider  albuterol (PROVENTIL HFA;VENTOLIN HFA) 108 (90 BASE) MCG/ACT inhaler Inhale 2 puffs into the lungs every 6 (six) hours as needed for shortness of breath. 05/09/14  Yes Modena Jansky, MD  albuterol (PROVENTIL) (2.5 MG/3ML) 0.083% nebulizer solution Take 3 mLs (2.5 mg total) by nebulization every 4 (four) hours as needed for wheezing or shortness of breath. 07/08/15  Yes Kelvin Cellar, MD  aspirin 81 MG tablet Take 81 mg by mouth at bedtime.   Yes Historical Provider, MD  cloNIDine (CATAPRES) 0.1 MG tablet Take 1 tablet (0.1 mg total) by mouth 3 (three) times daily. 07/08/15  Yes Kelvin Cellar, MD  colchicine 0.6 MG tablet Take 0.6 mg by mouth daily as needed (gout flare up.). For gout   Yes Historical Provider, MD  fluticasone furoate-vilanterol (BREO ELLIPTA) 100-25 MCG/INH AEPB Inhale 1  puff into the lungs every morning. 07/24/15  Yes Tanda Rockers, MD  hydrALAZINE (APRESOLINE) 50 MG tablet Take 50 mg by mouth 2 (two) times daily.   Yes Historical Provider, MD  ibuprofen (ADVIL,MOTRIN) 200 MG tablet Take 200 mg by mouth every 6 (six) hours as needed for moderate pain.   Yes Historical Provider, MD  losartan (COZAAR) 25 MG tablet Take 1 tablet (25 mg total) by mouth daily. 09/07/15  Yes Thompson Grayer, MD  montelukast (SINGULAIR) 10 MG  tablet Take 10 mg by mouth at bedtime.   Yes Historical Provider, MD  rivaroxaban (XARELTO) 20 MG TABS tablet Take 1 tablet (20 mg total) by mouth daily with supper. 09/07/15  Yes Thompson Grayer, MD  torsemide (DEMADEX) 20 MG tablet Take 40 mg by mouth daily.   Yes Historical Provider, MD  HYDROcodone-acetaminophen (NORCO/VICODIN) 5-325 MG tablet Take 1-2 tablets by mouth every 4 (four) hours as needed. Patient not taking: Reported on 10/16/2015 09/08/15   Charlann Lange, PA-C    Family History Family History  Problem Relation Age of Onset  . Diabetes Father   . CAD Father   . Hypertension Other   . Diabetes Other   . Allergies Brother     Social History Social History  Substance Use Topics  . Smoking status: Former Smoker    Packs/day: 0.25    Years: 1.00    Types: Cigarettes    Quit date: 07/23/1989  . Smokeless tobacco: Never Used  . Alcohol use No     Allergies   Review of patient's allergies indicates no known allergies.   Review of Systems Review of Systems  All other systems reviewed and are negative.    Physical Exam Updated Vital Signs BP 170/82   Pulse 77   Temp 98.2 F (36.8 C) (Oral)   Resp 17   Ht 5\' 10"  (1.778 m)   Wt (!) 375 lb (170.1 kg)   SpO2 93%   BMI 53.81 kg/m   Physical Exam General: Well-developed, Morbidly obese male in no acute distress; appearance consistent with age of record HENT: normocephalic; atraumatic Eyes: pupils equal, round and reactive to light; extraocular muscles intact Neck: supple Heart: regular rate and rhythm; no murmurs, rubs or gallops Chest: Nontender Lungs: clear to auscultation bilaterally Abdomen: soft; obese; nontender; bowel sounds present Extremities: No deformity; full range of motion; pulses normal Neurologic: Awake, alert and oriented; motor function intact in all extremities and symmetric; no facial droop Skin: Warm and dry Psychiatric: Normal mood and affect    ED Treatments / Results   Nursing  notes and vitals signs, including pulse oximetry, reviewed.  Summary of this visit's results, reviewed by myself:  Labs:  Results for orders placed or performed during the hospital encounter of 10/15/15 (from the past 24 hour(s))  I-stat troponin, ED     Status: None   Collection Time: 10/16/15  6:01 AM  Result Value Ref Range   Troponin i, poc 0.04 0.00 - 0.08 ng/mL   Comment 3            Imaging Studies: Dg Chest 2 View  Result Date: 10/15/2015 CLINICAL DATA:  Left-sided chest pain and shortness of breath beginning this morning. History of CHF and obstructive sleep apnea. History of pneumonia and prior pulmonary embolism. EXAM: CHEST  2 VIEW COMPARISON:  07/04/2015; 06/20/2015; 05/18/2015; chest CT - 06/20/2015 FINDINGS: Examination is degraded due to patient body habitus. Grossly unchanged enlarged cardiac silhouette and mediastinal contours. Evaluation the retrosternal  clear space obscured secondary to overlying soft tissues. There is persistent mild elevation / eventration of the right hemidiaphragm. No focal airspace opacities. No pleural effusion or pneumothorax. No evidence of edema. No acute osseous abnormalities. IMPRESSION: Similar findings of cardiomegaly without definitive superimposed acute cardiopulmonary disease. Electronically Signed   By: Sandi Mariscal M.D.   On: 10/15/2015 22:57   EKG  EKG Interpretation  Date/Time:  Monday October 15 2015 22:07:53 EDT Ventricular Rate:  83 PR Interval:    QRS Duration: 88 QT Interval:  444 QTC Calculation: 522 R Axis:   25 Text Interpretation:  Sinus rhythm Low voltage, precordial leads Borderline repolarization abnormality Prolonged QT interval Baseline wander in lead(s) I III aVL aVF No significant change was found Confirmed by Arius Harnois  MD, Jenny Reichmann ([id number]) on 10/16/2015 5:37:00 AM       Procedures (including critical care time)   Final Clinical Impressions(s) / ED Diagnoses  The patient has been asymptomatic in the ED. His EKG shows  no significant changes, he has no acute findings on his chest x-ray and his troponin is within normal limits. He will follow-up with his cardiologist,  Dr. [name]   Final diagnoses:  Atypical chest pain  SOB (shortness of breath)      Shanon Rosser, MD 10/16/15 [phone]

## 2015-11-15 ENCOUNTER — Telehealth: Payer: Self-pay | Admitting: Internal Medicine

## 2015-11-15 MED ORDER — FLUTICASONE FUROATE-VILANTEROL 100-25 MCG/INH IN AEPB: 1.0000 | INHALATION_SPRAY | Freq: Every morning | RESPIRATORY_TRACT | 6 refills | Status: DC

## 2015-11-15 NOTE — Telephone Encounter (Signed)
Called and spoke with pt and he is requesting that the breo be sent to his pharmacy.  rx has been sent in and pt is aware. Nothing further is needed.

## 2015-11-29 ENCOUNTER — Ambulatory Visit (INDEPENDENT_AMBULATORY_CARE_PROVIDER_SITE_OTHER): Payer: Managed Care, Other (non HMO) | Admitting: Pulmonary Disease

## 2015-11-29 ENCOUNTER — Encounter: Payer: Self-pay | Admitting: Pulmonary Disease

## 2015-11-29 ENCOUNTER — Telehealth: Payer: Self-pay | Admitting: Internal Medicine

## 2015-11-29 VITALS — BP 156/90 | HR 78 | Ht 70.0 in | Wt 381.6 lb

## 2015-11-29 DIAGNOSIS — G4733 Obstructive sleep apnea (adult) (pediatric): Secondary | ICD-10-CM | POA: Diagnosis not present

## 2015-11-29 DIAGNOSIS — Z9989 Dependence on other enabling machines and devices: Principal | ICD-10-CM

## 2015-11-29 MED ORDER — FLUTICASONE FUROATE-VILANTEROL 100-25 MCG/INH IN AEPB: 1.0000 | INHALATION_SPRAY | Freq: Every day | RESPIRATORY_TRACT | 0 refills | Status: DC

## 2015-11-29 MED ORDER — PREDNISONE 20 MG PO TABS: 10.0000 mg | ORAL_TABLET | Freq: Every day | ORAL | 0 refills | Status: DC

## 2015-11-29 NOTE — Progress Notes (Signed)
Subjective:    Patient ID: Phillip [name], male    DOB: May 02, 1960, 55 y.o.   MRN: [id number]  Synopsis: Patient of Dr. Christinia Gully who has asthma and morbid obesity. Exhaled NO 2017 31 ppm   HPI Chief Complaint  Patient presents with  . Acute Visit    MW pt presenting with wheezing, congestion, coughing but nothing is coming up,     Song says that for one week he has been congested in his chest.  He has been taking Breo but he still has chest tightness some dyspnea.  He says that prior to this he had no dyspnea.  He has a rattling in his chest now at night.  He says that he has used his breathing greatment (albuterol) at night which helps.  No sick contacts, no fevers, no chills.  No sinus symptoms.    He has been using the [facility] on an as needed basis until 7 days ago.    Past Medical History:  Diagnosis Date  . CHF (congestive heart failure) ([place]) 11/15   EF 40-45%, improved with follow-up  . Gout   . Hypertensive cardiovascular disease   . Morbid obesity ([place])   . Obstructive sleep apnea    complaint with CPAP  . OSA on CPAP   . Paroxysmal atrial fibrillation (H[facility])   . PNA (pneumonia)   . Pulmonary embolism ([facility])       Review of Systems     Objective:   Physical Exam  Vitals:   11/29/15 1400  BP: (!) 156/90  Pulse: 78  SpO2: 96%  Weight: (!) 381 lb 9.6 oz (173.1 kg)  Height: 5\' 10"  (1.778 m)    RA  Gen: obese but well appearing HENT: OP clear,  neck supple PULM: CTA B, normal percussion CV: RRR, no mgr, trace edema GI: BS+, soft, nontender Derm: no cyanosis or rash Psyche: normal mood and affect      Assessment & Plan:  Asthma with acute exacerbation He is having an exacerbation of his asthma. He does not note any sort of acid reflux or sinus symptoms which would explain his current symptoms.  It sounds as if he's been taking the Breo on an as-needed basis.   Plan: Prednisone taper Albuterol as needed Mucinex over-the-counter Considering the  fact that he's having an exacerbation of going to ask him to take [facility] on a daily basis and then talk to Dr. Melvyn Novas about whether or not he needs to continue this when he follows up.  OSA on CPAP He has been noted significant chest congestion every time he uses his CPAP machine. He says that he cleans his machine regularly and he has been replacing the tubing on a routine basis. He most recently replaced the tubing approximately one week ago.  Plan: Prescribed new CPAP machine  > 50% of this visit face to face, 26 minute visit   Current Outpatient Prescriptions:  .  albuterol (PROVENTIL HFA;VENTOLIN HFA) 108 (90 BASE) MCG/ACT inhaler, Inhale 2 puffs into the lungs every 6 (six) hours as needed for shortness of breath., Disp: 18 g, Rfl: 0 .  albuterol (PROVENTIL) (2.5 MG/3ML) 0.083% nebulizer solution, Take 3 mLs (2.5 mg total) by nebulization every 4 (four) hours as needed for wheezing or shortness of breath., Disp: 75 mL, Rfl: 12 .  aspirin 81 MG tablet, Take 81 mg by mouth at bedtime., Disp: , Rfl:  .  cloNIDine (CATAPRES) 0.1 MG tablet, Take 1 tablet (0.1 mg total) by  mouth 3 (three) times daily., Disp: 60 tablet, Rfl: 11 .  colchicine 0.6 MG tablet, Take 0.6 mg by mouth daily as needed (gout flare up.). For gout, Disp: , Rfl:  .  fluticasone furoate-vilanterol (BREO ELLIPTA) 100-25 MCG/INH AEPB, Inhale 1 puff into the lungs every morning., Disp: 60 each, Rfl: 6 .  hydrALAZINE (APRESOLINE) 50 MG tablet, Take 50 mg by mouth 2 (two) times daily., Disp: , Rfl:  .  ibuprofen (ADVIL,MOTRIN) 200 MG tablet, Take 200 mg by mouth every 6 (six) hours as needed for moderate pain., Disp: , Rfl:  .  losartan (COZAAR) 25 MG tablet, Take 1 tablet (25 mg total) by mouth daily., Disp: 90 tablet, Rfl: 3 .  montelukast (SINGULAIR) 10 MG tablet, Take 10 mg by mouth at bedtime., Disp: , Rfl:  .  rivaroxaban (XARELTO) 20 MG TABS tablet, Take 1 tablet (20 mg total) by mouth daily with supper., Disp: 30 tablet, Rfl:  11 .  torsemide (DEMADEX) 20 MG tablet, Take 40 mg by mouth daily., Disp: , Rfl:  .  fluticasone furoate-vilanterol (BREO ELLIPTA) 100-25 MCG/INH AEPB, Inhale 1 puff into the lungs daily., Disp: 2 each, Rfl: 0 .  predniSONE (DELTASONE) 20 MG tablet, Take 0.5 tablets (10 mg total) by mouth daily with breakfast., Disp: 5 tablet, Rfl: 0

## 2015-11-29 NOTE — Assessment & Plan Note (Signed)
He has been noted significant chest congestion every time he uses his CPAP machine. He says that he cleans his machine regularly and he has been replacing the tubing on a routine basis. He most recently replaced the tubing approximately one week ago.  Plan: Prescribed new CPAP machine

## 2015-11-29 NOTE — Assessment & Plan Note (Signed)
He is having an exacerbation of his asthma. He does not note any sort of acid reflux or sinus symptoms which would explain his current symptoms.  It sounds as if he's been taking the Breo on an as-needed basis.   Plan: Prednisone taper Albuterol as needed Mucinex over-the-counter Considering the fact that he's having an exacerbation of going to ask him to take [facility] on a daily basis and then talk to Dr. Melvyn Novas about whether or not he needs to continue this when he follows up.

## 2015-11-29 NOTE — Telephone Encounter (Signed)
Called spoke with pt. appt scheduled this afternoon with BQ for appt.

## 2015-11-29 NOTE — Patient Instructions (Signed)
Take the prednisone as prescribed Take over-the-counter guaifenesin 600 mg twice a day Take Breo daily Use your albuterol as needed for chest tightness and shortness of breath We will prescribe a new CPAP machine Come back and see Dr. Melvyn Novas in 4 weeks

## 2015-11-29 NOTE — Telephone Encounter (Signed)
Pt c/o chest congestion, prod cough with yellow mucus, increased sob X1 wk.  Pt has been taking albuterol nebs and inhalers, requesting further recs.  Denies fever, chest pain, low 02 sats (pt wears 2lpm) Pharmacy: [facility].

## 2015-12-11 ENCOUNTER — Ambulatory Visit: Payer: Self-pay | Admitting: Internal Medicine

## 2015-12-14 ENCOUNTER — Encounter (HOSPITAL_COMMUNITY): Payer: Self-pay | Admitting: *Deleted

## 2015-12-14 ENCOUNTER — Inpatient Hospital Stay (HOSPITAL_COMMUNITY)
Admission: EM | Admit: 2015-12-14 | Discharge: 2015-12-16 | DRG: 291 | Disposition: A | Discharge status: Home / Self Care | Payer: Managed Care, Other (non HMO) | Attending: Internal Medicine | Admitting: Internal Medicine

## 2015-12-14 ENCOUNTER — Emergency Department (HOSPITAL_COMMUNITY): Payer: Managed Care, Other (non HMO)

## 2015-12-14 DIAGNOSIS — J440 Chronic obstructive pulmonary disease with acute lower respiratory infection: Secondary | ICD-10-CM | POA: Diagnosis present

## 2015-12-14 DIAGNOSIS — J4531 Mild persistent asthma with (acute) exacerbation: Secondary | ICD-10-CM | POA: Diagnosis present

## 2015-12-14 DIAGNOSIS — Z833 Family history of diabetes mellitus: Secondary | ICD-10-CM

## 2015-12-14 DIAGNOSIS — I130 Hypertensive heart and chronic kidney disease with heart failure and stage 1 through stage 4 chronic kidney disease, or unspecified chronic kidney disease: Secondary | ICD-10-CM | POA: Diagnosis not present

## 2015-12-14 DIAGNOSIS — E1122 Type 2 diabetes mellitus with diabetic chronic kidney disease: Secondary | ICD-10-CM | POA: Diagnosis present

## 2015-12-14 DIAGNOSIS — Z9981 Dependence on supplemental oxygen: Secondary | ICD-10-CM

## 2015-12-14 DIAGNOSIS — Z86718 Personal history of other venous thrombosis and embolism: Secondary | ICD-10-CM

## 2015-12-14 DIAGNOSIS — J441 Chronic obstructive pulmonary disease with (acute) exacerbation: Secondary | ICD-10-CM | POA: Diagnosis present

## 2015-12-14 DIAGNOSIS — Z9989 Dependence on other enabling machines and devices: Secondary | ICD-10-CM

## 2015-12-14 DIAGNOSIS — J9601 Acute respiratory failure with hypoxia: Secondary | ICD-10-CM | POA: Diagnosis not present

## 2015-12-14 DIAGNOSIS — J9600 Acute respiratory failure, unspecified whether with hypoxia or hypercapnia: Secondary | ICD-10-CM | POA: Diagnosis present

## 2015-12-14 DIAGNOSIS — N183 Chronic kidney disease, stage 3 (moderate): Secondary | ICD-10-CM | POA: Diagnosis present

## 2015-12-14 DIAGNOSIS — I10 Essential (primary) hypertension: Secondary | ICD-10-CM | POA: Diagnosis present

## 2015-12-14 DIAGNOSIS — I480 Paroxysmal atrial fibrillation: Secondary | ICD-10-CM | POA: Diagnosis present

## 2015-12-14 DIAGNOSIS — I4891 Unspecified atrial fibrillation: Secondary | ICD-10-CM | POA: Diagnosis present

## 2015-12-14 DIAGNOSIS — Z79899 Other long term (current) drug therapy: Secondary | ICD-10-CM

## 2015-12-14 DIAGNOSIS — Z86711 Personal history of pulmonary embolism: Secondary | ICD-10-CM | POA: Diagnosis present

## 2015-12-14 DIAGNOSIS — J45901 Unspecified asthma with (acute) exacerbation: Secondary | ICD-10-CM

## 2015-12-14 DIAGNOSIS — Z8249 Family history of ischemic heart disease and other diseases of the circulatory system: Secondary | ICD-10-CM

## 2015-12-14 DIAGNOSIS — Z87891 Personal history of nicotine dependence: Secondary | ICD-10-CM

## 2015-12-14 DIAGNOSIS — J4530 Mild persistent asthma, uncomplicated: Secondary | ICD-10-CM | POA: Diagnosis present

## 2015-12-14 DIAGNOSIS — Z6841 Body Mass Index (BMI) 40.0 and over, adult: Secondary | ICD-10-CM

## 2015-12-14 DIAGNOSIS — Z7982 Long term (current) use of aspirin: Secondary | ICD-10-CM

## 2015-12-14 DIAGNOSIS — J189 Pneumonia, unspecified organism: Secondary | ICD-10-CM | POA: Diagnosis present

## 2015-12-14 DIAGNOSIS — G4733 Obstructive sleep apnea (adult) (pediatric): Secondary | ICD-10-CM | POA: Diagnosis present

## 2015-12-14 DIAGNOSIS — I5033 Acute on chronic diastolic (congestive) heart failure: Secondary | ICD-10-CM | POA: Diagnosis present

## 2015-12-14 DIAGNOSIS — I471 Supraventricular tachycardia, unspecified: Secondary | ICD-10-CM | POA: Diagnosis present

## 2015-12-14 DIAGNOSIS — Z7951 Long term (current) use of inhaled steroids: Secondary | ICD-10-CM

## 2015-12-14 DIAGNOSIS — R000 Tachycardia, unspecified: Secondary | ICD-10-CM

## 2015-12-14 DIAGNOSIS — Z7901 Long term (current) use of anticoagulants: Secondary | ICD-10-CM

## 2015-12-14 DIAGNOSIS — M109 Gout, unspecified: Secondary | ICD-10-CM | POA: Diagnosis present

## 2015-12-14 DIAGNOSIS — I82409 Acute embolism and thrombosis of unspecified deep veins of unspecified lower extremity: Secondary | ICD-10-CM | POA: Diagnosis present

## 2015-12-14 DIAGNOSIS — R0600 Dyspnea, unspecified: Secondary | ICD-10-CM | POA: Diagnosis not present

## 2015-12-14 LAB — CBC WITH DIFFERENTIAL/PLATELET
Basophils Absolute: 0 10*3/uL (ref 0.0–0.1)
Basophils Relative: 1 %
EOS ABS: 0.1 10*3/uL (ref 0.0–0.7)
Eosinophils Relative: 1 %
HEMATOCRIT: 37.5 % — AB (ref 39.0–52.0)
HEMOGLOBIN: 11.8 g/dL — AB (ref 13.0–17.0)
LYMPHS ABS: 1.4 10*3/uL (ref 0.7–4.0)
Lymphocytes Relative: 33 %
MCH: 27.4 pg (ref 26.0–34.0)
MCHC: 31.5 g/dL (ref 30.0–36.0)
MCV: 87 fL (ref 78.0–100.0)
Monocytes Absolute: 0.3 10*3/uL (ref 0.1–1.0)
Monocytes Relative: 7 %
NEUTROS ABS: 2.5 10*3/uL (ref 1.7–7.7)
NEUTROS PCT: 58 %
Platelets: 179 10*3/uL (ref 150–400)
RBC: 4.31 MIL/uL (ref 4.22–5.81)
RDW: 14.5 % (ref 11.5–15.5)
WBC: 4.2 10*3/uL (ref 4.0–10.5)

## 2015-12-14 LAB — BASIC METABOLIC PANEL
ANION GAP: 8 (ref 5–15)
BUN: 23 mg/dL — ABNORMAL HIGH (ref 6–20)
CALCIUM: 8.8 mg/dL — AB (ref 8.9–10.3)
CHLORIDE: 103 mmol/L (ref 101–111)
CO2: 31 mmol/L (ref 22–32)
Creatinine, Ser: 1.77 mg/dL — ABNORMAL HIGH (ref 0.61–1.24)
GFR calc non Af Amer: 42 mL/min — ABNORMAL LOW (ref >60)
GFR, EST AFRICAN AMERICAN: 49 mL/min — AB (ref >60)
GLUCOSE: 109 mg/dL — AB (ref 65–99)
POTASSIUM: 3 mmol/L — AB (ref 3.5–5.1)
Sodium: 142 mmol/L (ref 135–145)

## 2015-12-14 LAB — CBG MONITORING, ED: GLUCOSE-CAPILLARY: 182 mg/dL — AB (ref 65–99)

## 2015-12-14 LAB — GLUCOSE, CAPILLARY
GLUCOSE-CAPILLARY: 88 mg/dL (ref 65–99)
Glucose-Capillary: 112 mg/dL — ABNORMAL HIGH (ref 65–99)

## 2015-12-14 LAB — BRAIN NATRIURETIC PEPTIDE: B Natriuretic Peptide: 318.9 pg/mL — ABNORMAL HIGH (ref 0.0–100.0)

## 2015-12-14 LAB — TSH: TSH: 0.632 u[IU]/mL (ref 0.350–4.500)

## 2015-12-14 LAB — I-STAT TROPONIN, ED: TROPONIN I, POC: 0.05 ng/mL (ref 0.00–0.08)

## 2015-12-14 LAB — STREP PNEUMONIAE URINARY ANTIGEN: STREP PNEUMO URINARY ANTIGEN: NEGATIVE

## 2015-12-14 MED ORDER — RIVAROXABAN 20 MG PO TABS
20.0000 mg | ORAL_TABLET | Freq: Every day | ORAL | Status: DC
  Administered 2015-12-14 – 2015-12-15 (×2): 20 mg via ORAL
  Filled 2015-12-14 (×2): qty 1

## 2015-12-14 MED ORDER — IPRATROPIUM BROMIDE 0.02 % IN SOLN
0.5000 mg | RESPIRATORY_TRACT | Status: DC | PRN
  Administered 2015-12-14 (×2): 0.5 mg via RESPIRATORY_TRACT
  Filled 2015-12-14 (×2): qty 2.5

## 2015-12-14 MED ORDER — ASPIRIN 81 MG PO TABS: 81.0000 mg | ORAL_TABLET | Freq: Every day | ORAL | Status: DC

## 2015-12-14 MED ORDER — ASPIRIN EC 81 MG PO TBEC
81.0000 mg | DELAYED_RELEASE_TABLET | Freq: Every day | ORAL | Status: DC
  Administered 2015-12-14 – 2015-12-15 (×2): 81 mg via ORAL
  Filled 2015-12-14 (×2): qty 1

## 2015-12-14 MED ORDER — SODIUM CHLORIDE 0.9% FLUSH
3.0000 mL | Freq: Two times a day (BID) | INTRAVENOUS | Status: DC
  Administered 2015-12-14: 3 mL via INTRAVENOUS

## 2015-12-14 MED ORDER — SODIUM CHLORIDE 0.9 % IV SOLN: 250.0000 mL | INTRAVENOUS | Status: DC | PRN

## 2015-12-14 MED ORDER — POTASSIUM CHLORIDE CRYS ER 20 MEQ PO TBCR
40.0000 meq | EXTENDED_RELEASE_TABLET | Freq: Once | ORAL | Status: AC
  Administered 2015-12-14: 40 meq via ORAL
  Filled 2015-12-14: qty 2

## 2015-12-14 MED ORDER — FUROSEMIDE 10 MG/ML IJ SOLN
40.0000 mg | Freq: Every day | INTRAMUSCULAR | Status: DC
  Administered 2015-12-14 – 2015-12-15 (×2): 40 mg via INTRAVENOUS
  Filled 2015-12-14 (×2): qty 4

## 2015-12-14 MED ORDER — ACETAMINOPHEN 325 MG PO TABS
650.0000 mg | ORAL_TABLET | Freq: Four times a day (QID) | ORAL | Status: DC | PRN
Start: 2015-12-14 — End: 2015-12-16

## 2015-12-14 MED ORDER — ALBUTEROL SULFATE (2.5 MG/3ML) 0.083% IN NEBU
5.0000 mg | INHALATION_SOLUTION | RESPIRATORY_TRACT | Status: DC | PRN
  Administered 2015-12-14 (×2): 5 mg via RESPIRATORY_TRACT
  Filled 2015-12-14 (×2): qty 6

## 2015-12-14 MED ORDER — CLONIDINE HCL 0.1 MG PO TABS
0.1000 mg | ORAL_TABLET | Freq: Three times a day (TID) | ORAL | Status: DC
  Administered 2015-12-14 – 2015-12-16 (×7): 0.1 mg via ORAL
  Filled 2015-12-14 (×7): qty 1

## 2015-12-14 MED ORDER — MONTELUKAST SODIUM 10 MG PO TABS
10.0000 mg | ORAL_TABLET | Freq: Every day | ORAL | Status: DC
  Administered 2015-12-14 – 2015-12-15 (×2): 10 mg via ORAL
  Filled 2015-12-14 (×2): qty 1

## 2015-12-14 MED ORDER — METHYLPREDNISOLONE SODIUM SUCC 125 MG IJ SOLR
125.0000 mg | Freq: Once | INTRAMUSCULAR | Status: AC
  Administered 2015-12-14: 125 mg via INTRAVENOUS
  Filled 2015-12-14: qty 2

## 2015-12-14 MED ORDER — FLUTICASONE FUROATE-VILANTEROL 100-25 MCG/INH IN AEPB
1.0000 | INHALATION_SPRAY | Freq: Every day | RESPIRATORY_TRACT | Status: DC
  Administered 2015-12-14 – 2015-12-16 (×3): 1 via RESPIRATORY_TRACT
  Filled 2015-12-14: qty 28

## 2015-12-14 MED ORDER — ORAL CARE MOUTH RINSE
15.0000 mL | Freq: Two times a day (BID) | OROMUCOSAL | Status: DC
  Administered 2015-12-14 – 2015-12-15 (×2): 15 mL via OROMUCOSAL

## 2015-12-14 MED ORDER — ALBUTEROL SULFATE (2.5 MG/3ML) 0.083% IN NEBU: 2.5000 mg | INHALATION_SOLUTION | RESPIRATORY_TRACT | Status: DC | PRN

## 2015-12-14 MED ORDER — INSULIN ASPART 100 UNIT/ML ~~LOC~~ SOLN
0.0000 [IU] | Freq: Three times a day (TID) | SUBCUTANEOUS | Status: DC
Start: 2015-12-14 — End: 2015-12-16
  Administered 2015-12-14: 2 [IU] via SUBCUTANEOUS
  Administered 2015-12-15: 1 [IU] via SUBCUTANEOUS
  Filled 2015-12-14: qty 1

## 2015-12-14 MED ORDER — LEVOFLOXACIN 750 MG PO TABS
750.0000 mg | ORAL_TABLET | ORAL | Status: DC
  Administered 2015-12-15 – 2015-12-16 (×2): 750 mg via ORAL
  Filled 2015-12-14 (×2): qty 1

## 2015-12-14 MED ORDER — SODIUM CHLORIDE 0.9% FLUSH
3.0000 mL | Freq: Two times a day (BID) | INTRAVENOUS | Status: DC
  Administered 2015-12-15 – 2015-12-16 (×2): 3 mL via INTRAVENOUS

## 2015-12-14 MED ORDER — HYDRALAZINE HCL 50 MG PO TABS
50.0000 mg | ORAL_TABLET | Freq: Two times a day (BID) | ORAL | Status: DC
  Administered 2015-12-14 – 2015-12-16 (×5): 50 mg via ORAL
  Filled 2015-12-14 (×5): qty 1

## 2015-12-14 MED ORDER — SODIUM CHLORIDE 0.9% FLUSH: 3.0000 mL | INTRAVENOUS | Status: DC | PRN

## 2015-12-14 MED ORDER — ACETAMINOPHEN 650 MG RE SUPP: 650.0000 mg | Freq: Four times a day (QID) | RECTAL | Status: DC | PRN

## 2015-12-14 MED ORDER — LOSARTAN POTASSIUM 50 MG PO TABS
25.0000 mg | ORAL_TABLET | Freq: Every day | ORAL | Status: DC
  Administered 2015-12-14 – 2015-12-16 (×3): 25 mg via ORAL
  Filled 2015-12-14 (×3): qty 1

## 2015-12-14 MED ORDER — LEVOFLOXACIN IN D5W 750 MG/150ML IV SOLN
750.0000 mg | Freq: Once | INTRAVENOUS | Status: AC
  Administered 2015-12-14: 750 mg via INTRAVENOUS

## 2015-12-14 NOTE — ED Notes (Signed)
Admitting MD at bedside.

## 2015-12-14 NOTE — ED Provider Notes (Signed)
[facility] DEPT Provider Note   CSN: [id number] Arrival date & time: 12/14/15  0302  By signing my name below, I, Higinio Plan, attest that this documentation has been prepared under the direction and in the presence of Sherwood Gambler, MD . Electronically Signed: Higinio Plan, Scribe. 12/14/2015. 3:22 AM.  History   Chief Complaint Chief Complaint  Patient presents with  . Respiratory Distress   The history is provided by the patient. No language interpreter was used.   HPI Comments: Phillip [name] is a 55 y.o. male with PMHx of CHF, PE, hypertensive cardiovascular disease, who presents to the Emergency Department for an evaluation of shortness of breath that began over 24 hours ago and suddenly worsened 4 hours PTA. Pt reports he used a breathing treatment and home oxygen yesterday with mild relief; he notes he only uses home oxygen as needed. He states associated cough, congestion and bilateral ankle swelling that began 1 week ago. He denies fever, rhinorrhea, chest pain, nausea and vomiting. Pt states he visited his PCP 2 weeks ago for his congestion and was prescribed mucinex and prednisone which he has now completed. He notes he also takes torsemide every day.   Past Medical History:  Diagnosis Date  . CHF (congestive heart failure) ([place]) 11/15   EF 40-45%, improved with follow-up  . Diabetes mellitus without complication ([place])   . Gout   . Hypertension   . Hypertensive cardiovascular disease   . Morbid obesity ([place])   . Obstructive sleep apnea    complaint with CPAP  . OSA on CPAP   . Paroxysmal atrial fibrillation (H[facility])   . PNA (pneumonia)   . Pulmonary embolism [facility])     Patient Active Problem List   Diagnosis Date Noted  . Atrial fibrillation ([place]) [I48.91] 08/17/2015  . Long term (current) use of anticoagulants [Z79.01] 08/17/2015  . Hx pulmonary embolism 08/17/2015  . DVT (deep venous thrombosis) ([place]) 08/17/2015  . Deep vein thrombosis (DVT) of lower extremity ([place])  08/17/2015  . Paroxysmal atrial fibrillation ([place]) 08/04/2015  . HCAP (healthcare-associated pneumonia) 07/04/2015  . SVT (supraventricular tachycardia) ([place]) 06/22/2015  . Acute on chronic respiratory failure with hypoxia ([place]) 06/21/2015  . Morbid (severe) obesity due to excess calories ([place]) 03/07/2015  . Chronic combined systolic and diastolic CHF (congestive heart failure) ([place]) 03/07/2015  . Acute respiratory failure ([place]) 02/20/2015  . Essential hypertension   . Gout   . CAP (community acquired pneumonia) 05/07/2014  . Mild persistent asthma in adult without complication 92/33/0076  . OSA on CPAP 05/06/2014  . Hypoxia 05/06/2014  . Acute renal failure superimposed on stage 3 chronic kidney disease ([place]) 05/06/2014  . Asthma with acute exacerbation 05/06/2014    Past Surgical History:  Procedure Laterality Date  . UMBILICAL HERNIA REPAIR  1992    Home Medications    Prior to Admission medications   Medication Sig Start Date End Date Taking? Authorizing Provider  albuterol (PROVENTIL HFA;VENTOLIN HFA) 108 (90 BASE) MCG/ACT inhaler Inhale 2 puffs into the lungs every 6 (six) hours as needed for shortness of breath. 05/09/14  Yes Modena Jansky, MD  albuterol (PROVENTIL) (2.5 MG/3ML) 0.083% nebulizer solution Take 3 mLs (2.5 mg total) by nebulization every 4 (four) hours as needed for wheezing or shortness of breath. 07/08/15  Yes Kelvin Cellar, MD  aspirin 81 MG tablet Take 81 mg by mouth at bedtime.   Yes Historical Provider, MD  colchicine 0.6 MG tablet Take 0.6 mg by mouth daily as needed (  gout flare up.). For gout   Yes Historical Provider, MD  fluticasone furoate-vilanterol (BREO ELLIPTA) 100-25 MCG/INH AEPB Inhale 1 puff into the lungs daily. 11/29/15  Yes Juanito Doom, MD  hydrALAZINE (APRESOLINE) 50 MG tablet Take 50 mg by mouth 2 (two) times daily.   Yes Historical Provider, MD  ibuprofen (ADVIL,MOTRIN) 200 MG tablet Take 200 mg by mouth every 6 (six) hours as  needed for moderate pain.   Yes Historical Provider, MD  losartan (COZAAR) 25 MG tablet Take 1 tablet (25 mg total) by mouth daily. 09/07/15  Yes Thompson Grayer, MD  montelukast (SINGULAIR) 10 MG tablet Take 10 mg by mouth at bedtime.   Yes Historical Provider, MD  naproxen sodium (ANAPROX) 220 MG tablet Take 220 mg by mouth 2 (two) times daily with a meal.   Yes Historical Provider, MD  rivaroxaban (XARELTO) 20 MG TABS tablet Take 1 tablet (20 mg total) by mouth daily with supper. 09/07/15  Yes Thompson Grayer, MD  torsemide (DEMADEX) 20 MG tablet Take 40 mg by mouth daily.   Yes Historical Provider, MD  cloNIDine (CATAPRES) 0.1 MG tablet Take 1 tablet (0.1 mg total) by mouth 3 (three) times daily. Patient not taking: Reported on 12/14/2015 07/08/15   Kelvin Cellar, MD  fluticasone furoate-vilanterol (BREO ELLIPTA) 100-25 MCG/INH AEPB Inhale 1 puff into the lungs every morning. Patient not taking: Reported on 12/14/2015 11/15/15   Tanda Rockers, MD  predniSONE (DELTASONE) 20 MG tablet Take 0.5 tablets (10 mg total) by mouth daily with breakfast. Patient not taking: Reported on 12/14/2015 11/29/15   Juanito Doom, MD    Family History Family History  Problem Relation Age of Onset  . Diabetes Father   . CAD Father   . Hypertension Other   . Diabetes Other   . Allergies Brother     Social History Social History  Substance Use Topics  . Smoking status: Former Smoker    Packs/day: 0.25    Years: 1.00    Types: Cigarettes    Quit date: 07/23/1989  . Smokeless tobacco: Never Used  . Alcohol use No     Allergies   Review of patient's allergies indicates no known allergies.   Review of Systems Review of Systems  Constitutional: Negative for fever.  HENT: Positive for congestion. Negative for rhinorrhea.   Respiratory: Positive for cough and shortness of breath.   Cardiovascular: Positive for leg swelling. Negative for chest pain.   Physical Exam Updated Vital Signs BP 166/77   Pulse  95   Temp 98.1 F (36.7 C) (Oral)   Resp 26   SpO2 (!) 85%   Physical Exam  Constitutional: He is oriented to person, place, and time. He appears well-developed and well-nourished. No distress.  Morbidly obese  HENT:  Head: Normocephalic and atraumatic.  Right Ear: External ear normal.  Left Ear: External ear normal.  Nose: Nose normal.  Eyes: Right eye exhibits no discharge. Left eye exhibits no discharge.  Neck: Neck supple.  Cardiovascular: Normal rate, regular rhythm and normal heart sounds.   Pulmonary/Chest: Effort normal. He has wheezes (diffuse expiratory wheezes).  No significant increase in work of breathing   Abdominal: Soft. There is no tenderness.  Musculoskeletal: He exhibits edema.  Neurological: He is alert and oriented to person, place, and time.  Skin: Skin is warm and dry. He is not diaphoretic.  Nursing note and vitals reviewed.  ED Treatments / Results  Labs (all labs ordered are listed, but only  abnormal results are displayed) Labs Reviewed  BASIC METABOLIC PANEL - Abnormal; Notable for the following:       Result Value   Potassium 3.0 (*)    Glucose, Bld 109 (*)    BUN 23 (*)    Creatinine, Ser 1.77 (*)    Calcium 8.8 (*)    GFR calc non Af Amer 42 (*)    GFR calc Af Amer 49 (*)    All other components within normal limits  BRAIN NATRIURETIC PEPTIDE - Abnormal; Notable for the following:    B Natriuretic Peptide 318.9 (*)    All other components within normal limits  CBC WITH DIFFERENTIAL/PLATELET - Abnormal; Notable for the following:    Hemoglobin 11.8 (*)    HCT 37.5 (*)    All other components within normal limits  CULTURE, BLOOD (ROUTINE X 2)  CULTURE, BLOOD (ROUTINE X 2)  I-STAT TROPOININ, ED    EKG  EKG Interpretation  Date/Time:  Friday December 14 2015 03:15:44 EDT Ventricular Rate:  74 PR Interval:    QRS Duration: 91 QT Interval:  432 QTC Calculation: 480 R Axis:   -24 Text Interpretation:  Sinus rhythm Abnormal R-wave  progression, late transition Left ventricular hypertrophy Nonspecific T abnormalities, lateral leads Borderline prolonged QT interval no significant change since July 2017 Confirmed by Regenia Skeeter MD, Keyvon Herter [id number]) on 12/14/2015 3:23:23 AM       Radiology Dg Chest 2 View  Result Date: 12/14/2015 CLINICAL DATA:  Asthma and shortness of breath for 24 hours. Chest feels tight. EXAM: CHEST  2 VIEW COMPARISON:  10/15/2015 FINDINGS: Shallow inspiration. Cardiac enlargement. Normal pulmonary vascularity. Suggestion of vague increased density in the right upper lung, new since prior study, suggesting possible developing pneumonia in this area. Left lung appears clear and expanded. No blunting of costophrenic angles. No pneumothorax. Mediastinal contours appear intact. IMPRESSION: Possible early infiltration in the right upper lung suggesting developing pneumonia. Electronically Signed   By: Lucienne Capers M.D.   On: 12/14/2015 04:58    Procedures Procedures (including critical care time)  Medications Ordered in ED Medications  levofloxacin (LEVAQUIN) IVPB 750 mg (750 mg Intravenous New Bag/Given 12/14/15 0550)  potassium chloride SA (K-DUR,KLOR-CON) CR tablet 40 mEq (not administered)  methylPREDNISolone sodium succinate (SOLU-MEDROL) 125 mg/2 mL injection 125 mg (125 mg Intravenous Given 12/14/15 0344)    DIAGNOSTIC STUDIES:  Oxygen Saturation is 85% on RA, low by my interpretation.    COORDINATION OF CARE:  3:18 AM Discussed treatment plan with pt at bedside and pt agreed to plan.  Initial Impression / Assessment and Plan / ED Course  I have reviewed the triage vital signs and the nursing notes.  Pertinent labs & imaging results that were available during my care of the patient were reviewed by me and considered in my medical decision making (see chart for details).  Clinical Course  Comment By Time  Bronchospasm vs edema. I think PE and ACS are less likely. No distress. Breathing  treatments, solumedrol, CXR, labs, ECG Sherwood Gambler, MD 09/22 0319  X-ray shows likely developing pneumonia. He is feeling better, however feels intermittent palpitations. He is still wheezy and intermittently develops a rapid rate of around 150-160 each per minute. Have not been able to catch an EKG due to the rate returns back to normal quickly. However telemetry review indicates it could be SVT versus A. fib. Due to this with him needing more breathing treatments of think he needs to continue to be monitored. Plan  to admit to the hospitalist. Sherwood Gambler, MD 09/22 [phone]    Dr. Blaine Hamper to admit. He is mostly in NSR with brief changes into this tachyarrhythmia. Is narrow complex. Not lasting long enough for cardioversion or other acute treatment. Still needs respiratory support but must be more cautious with the albuterol so I think monitoring overnight is in his best interest.  I personally performed the services described in this documentation, which was scribed in my presence. The recorded information has been reviewed and is accurate.   Final Clinical Impressions(s) / ED Diagnoses   Final diagnoses:  Community acquired pneumonia  Asthma exacerbation  Tachyarrhythmia    New Prescriptions New Prescriptions   No medications on file     Sherwood Gambler, MD 12/14/15 [phone]

## 2015-12-14 NOTE — Progress Notes (Signed)
Report from [place], [place].

## 2015-12-14 NOTE — Progress Notes (Signed)
Pt arrived from ED and received by J. Asaro Therapist, sports and S. Lewis RN. Pt A&Ox4, denies pain/SOB/discomfort. Pt oriented to callbell and environment. POC discussed. Tele box 6 confirmed w/CCMD by 2 RNs. Pt resting comfortably. No c/o.

## 2015-12-14 NOTE — ED Notes (Signed)
EKG given to EDP,[place],.MD., for review. 

## 2015-12-14 NOTE — Progress Notes (Signed)
This is a no charge note  Pending in admission per Dr. Regenia Skeeter  55 year old man with past medical history of hypertension, diabetes mellitus, asthma, gout, A fib and PE on Xarelto, who presents with shortness of breath, oxygen saturation 77%. Chest x-ray showed possible right upper lobe infiltration. Patient states that he gained 11 pounds recently. BNP 318, negative troponin, WBC 4.2. Pt has wheezing on auscultation bradycardia. One dose of Levaquin was given in ED. Pt is accepted to tele for obs  Ivor Costa, MD  [facility] Pager [phone]  If 7PM-7AM, please contact night-coverage [email or web address] Password [facility] 12/14/2015, 6:12 AM

## 2015-12-14 NOTE — ED Notes (Signed)
Bed: WA17 Expected date:  Expected time:  Means of arrival:  Comments: 

## 2015-12-14 NOTE — ED Notes (Signed)
Pt can go to floor at 14:43.

## 2015-12-14 NOTE — ED Triage Notes (Signed)
Pt arrives to the ER with complaints of difficulty breathing; pt states that he has a hx of CHF and has felt "congested" since yesterday; pt states that it has gotten progressively worse since then; pt arrives with O2 sats 77% on room air

## 2015-12-14 NOTE — ED Notes (Addendum)
Report called to [address].

## 2015-12-14 NOTE — H&P (Signed)
History and Physical    Phillip [name] JOI:[id number] DOB: 05-25-1960 DOA: 12/14/2015  PCP: [name], MD  Patient coming from: Home  Chief Complaint: Dyspnea  HPI: Phillip [name] is a 55 y.o. male with medical history significant of Asthma,? COPD, atrial fibrillation, PE, DVT, diastolic heart failure, OSA, obesity. Patient reports he has had a one-day history of dyspnea with associated wheezing. He uses his albuterol and Breo Ellipta home which have not helped. Symptoms worsened overnight so he came in for evaluation.  ED Course: Vitals: Afebrile, slightly hypertensive. Hypoxemic on arrival to 85% and currently on nasal cannula O2 at 2 L Labs: Creatinine appears stable. He has hypokalemia and elevated BNP. Troponin was negative. Hemoglobin slightly low at 11.8. Imaging: Chest x-ray is concerning for early pneumonia Medications/Course: Patient given Solu-Medrol, potassium, albuterol, Atrovent and Levaquin.  Review of Systems: Review of Systems  Constitutional: Negative for chills and fever.  Respiratory: Positive for cough, shortness of breath and wheezing. Negative for sputum production.   Cardiovascular: Positive for palpitations and leg swelling. Negative for chest pain.  Gastrointestinal: Negative for abdominal pain, constipation, diarrhea, nausea and vomiting.  All other systems reviewed and are negative.   Past Medical History:  Diagnosis Date  . CHF (congestive heart failure) ([place]) 11/15   EF 40-45%, improved with follow-up  . Diabetes mellitus without complication ([place])   . Gout   . Hypertension   . Hypertensive cardiovascular disease   . Morbid obesity ([place])   . Obstructive sleep apnea    complaint with CPAP  . OSA on CPAP   . Paroxysmal atrial fibrillation (HCC)   . PNA (pneumonia)   . Pulmonary embolism [facility])     Past Surgical History:  Procedure Laterality Date  . [place]     reports that he quit smoking about 26 years ago. His smoking  use included Cigarettes. He has a 0.25 pack-year smoking history. He has never used smokeless tobacco. He reports that he does not drink alcohol or use drugs.  No Known Allergies  Family History  Problem Relation Age of Onset  . Diabetes Father   . CAD Father   . Hypertension Other   . Diabetes Other   . Allergies Brother     Prior to Admission medications   Medication Sig Start Date End Date Taking? Authorizing Provider  albuterol (PROVENTIL HFA;VENTOLIN HFA) 108 (90 BASE) MCG/ACT inhaler Inhale 2 puffs into the lungs every 6 (six) hours as needed for shortness of breath. 05/09/14  Yes Modena Jansky, MD  albuterol (PROVENTIL) (2.5 MG/3ML) 0.083% nebulizer solution Take 3 mLs (2.5 mg total) by nebulization every 4 (four) hours as needed for wheezing or shortness of breath. 07/08/15  Yes Kelvin Cellar, MD  aspirin 81 MG tablet Take 81 mg by mouth at bedtime.   Yes Historical Provider, MD  colchicine 0.6 MG tablet Take 0.6 mg by mouth daily as needed (gout flare up.). For gout   Yes Historical Provider, MD  fluticasone furoate-vilanterol (BREO ELLIPTA) 100-25 MCG/INH AEPB Inhale 1 puff into the lungs daily. 11/29/15  Yes Juanito Doom, MD  hydrALAZINE (APRESOLINE) 50 MG tablet Take 50 mg by mouth 2 (two) times daily.   Yes Historical Provider, MD  ibuprofen (ADVIL,MOTRIN) 200 MG tablet Take 200 mg by mouth every 6 (six) hours as needed for moderate pain.   Yes Historical Provider, MD  losartan (COZAAR) 25 MG tablet Take 1 tablet (25 mg total) by mouth daily. 09/07/15  Yes Thompson Grayer, MD  montelukast (SINGULAIR) 10 MG tablet Take 10 mg by mouth at bedtime.   Yes Historical Provider, MD  naproxen sodium (ANAPROX) 220 MG tablet Take 220 mg by mouth 2 (two) times daily with a meal.   Yes Historical Provider, MD  rivaroxaban (XARELTO) 20 MG TABS tablet Take 1 tablet (20 mg total) by mouth daily with supper. 09/07/15  Yes Thompson Grayer, MD  torsemide (DEMADEX) 20 MG tablet Take 40 mg by mouth  daily.   Yes Historical Provider, MD  cloNIDine (CATAPRES) 0.1 MG tablet Take 1 tablet (0.1 mg total) by mouth 3 (three) times daily. Patient not taking: Reported on 12/14/2015 07/08/15   Kelvin Cellar, MD  predniSONE (DELTASONE) 20 MG tablet Take 0.5 tablets (10 mg total) by mouth daily with breakfast. Patient not taking: Reported on 12/14/2015 11/29/15   Juanito Doom, MD    Physical Exam: Vitals:   12/14/15 2778 12/14/15 0915 12/14/15 0929 12/14/15 1014  BP:   149/83 131/70  Pulse:  90  101  Resp:  23  18  Temp: 98.4 F (36.9 C)   98.4 F (36.9 C)  TempSrc: Oral   Oral  SpO2:  100% 98% 96%     Constitutional: NAD, calm, comfortable, elderly Obese Eyes: PERRL, lids and conjunctivae normal ENMT: Mucous membranes are moist. Posterior pharynx clear of any exudate or lesions.Normal dentition.  Neck: normal, supple, no masses, no thyromegaly Respiratory: Decreased breath sounds bilaterally. No wheezing heard on exam. No crackles. No accessory muscle use. Cardiovascular: Regular rate and rhythm, no murmurs / rubs / gallops. 2+ pedal pulses. Trace edema Abdomen: no tenderness, no masses palpated. Bowel sounds positive.  Musculoskeletal: no clubbing / cyanosis. No joint deformity upper and lower extremities. Good ROM, no contractures. Normal muscle tone.  Skin: no rashes, lesions, ulcers. No induration Neurologic: CN 2-12 grossly intact. Sensation intact, DTR normal. Strength 5/5 in all 4.  Psychiatric: Normal judgment and insight. Alert and oriented x 3. Normal mood.   Labs on Admission: I have personally reviewed following labs and imaging studies  CBC:  Recent Labs Lab 12/14/15 0327  WBC 4.2  NEUTROABS 2.5  HGB 11.8*  HCT 37.5*  MCV 87.0  PLT 242   Basic Metabolic Panel:  Recent Labs Lab 12/14/15 0327  NA 142  K 3.0*  CL 103  CO2 31  GLUCOSE 109*  BUN 23*  CREATININE 1.77*  CALCIUM 8.8*   Urine analysis:    Component Value Date/Time   COLORURINE YELLOW  09/08/2015 0050   APPEARANCEUR CLEAR 09/08/2015 0050   LABSPEC 1.014 09/08/2015 0050   PHURINE 5.5 09/08/2015 0050   GLUCOSEU NEGATIVE 09/08/2015 0050   HGBUR NEGATIVE 09/08/2015 0050   BILIRUBINUR NEGATIVE 09/08/2015 0050   KETONESUR NEGATIVE 09/08/2015 0050   PROTEINUR NEGATIVE 09/08/2015 0050   UROBILINOGEN 1.0 12/04/2014 0546   NITRITE NEGATIVE 09/08/2015 0050   LEUKOCYTESUR NEGATIVE 09/08/2015 0050    Radiological Exams on Admission: Dg Chest 2 View  Result Date: 12/14/2015 CLINICAL DATA:  Asthma and shortness of breath for 24 hours. Chest feels tight. EXAM: CHEST  2 VIEW COMPARISON:  10/15/2015 FINDINGS: Shallow inspiration. Cardiac enlargement. Normal pulmonary vascularity. Suggestion of vague increased density in the right upper lung, new since prior study, suggesting possible developing pneumonia in this area. Left lung appears clear and expanded. No blunting of costophrenic angles. No pneumothorax. Mediastinal contours appear intact. IMPRESSION: Possible early infiltration in the right upper lung suggesting developing pneumonia. Electronically Signed  By: Lucienne Capers M.D.   On: 12/14/2015 04:58    EKG: Independently reviewed.  Assessment/Plan Principal Problem:   CAP (community acquired pneumonia) Active Problems:   Mild persistent asthma in adult without complication   OSA on CPAP   Acute respiratory failure (HCC)   Essential hypertension   Morbid (severe) obesity due to excess calories (HCC)   SVT (supraventricular tachycardia) (HCC)   Paroxysmal atrial fibrillation (HCC)   Atrial fibrillation ([place]) [I48.91]   Long term (current) use of anticoagulants [Z79.01]   Hx pulmonary embolism   DVT (deep venous thrombosis) (HCC)  Dyspnea Acute respiratory failure with hypoxemia Possibly secondary to me acquired pneumonia. Clinically this appears more like an asthma exacerbation. His BNP is elevated and patient is up a few pounds. Possible heart failure contributing.  Blood pressure is up. Last EF of 55-60% in March 2017 -Continue Levaquin for treatment of CAP -Continue home inhalers -Will give Lasix 40 mg daily IV -Wean oxygen to room air -Weight  Hypertension -Continue home clonidine and losartan  Atrial fibrillation History of pulmonary embolism and DVT -Continue Xarelto  Obstructive sleep apnea -Continue CPAP daily at bedtime  CKD stage III Stable -Repeat BMP in the morning   DVT prophylaxis: Xarelto  Code Status: Full code  Family Communication: None at bedside  Disposition Plan: Likely discharge home tomorrow  Consults called: None  Admission status: Observation, telemetry    Cordelia Poche, MD [facility] Pager [phone]  If 7PM-7AM, please contact night-coverage [email or web address] Password [facility]  12/14/2015, 10:28 AM

## 2015-12-15 DIAGNOSIS — J4531 Mild persistent asthma with (acute) exacerbation: Secondary | ICD-10-CM | POA: Diagnosis present

## 2015-12-15 DIAGNOSIS — J189 Pneumonia, unspecified organism: Secondary | ICD-10-CM

## 2015-12-15 DIAGNOSIS — Z86711 Personal history of pulmonary embolism: Secondary | ICD-10-CM

## 2015-12-15 DIAGNOSIS — J9601 Acute respiratory failure with hypoxia: Secondary | ICD-10-CM | POA: Diagnosis present

## 2015-12-15 DIAGNOSIS — J441 Chronic obstructive pulmonary disease with (acute) exacerbation: Secondary | ICD-10-CM | POA: Diagnosis present

## 2015-12-15 DIAGNOSIS — R0600 Dyspnea, unspecified: Secondary | ICD-10-CM | POA: Diagnosis present

## 2015-12-15 DIAGNOSIS — Z8249 Family history of ischemic heart disease and other diseases of the circulatory system: Secondary | ICD-10-CM | POA: Diagnosis not present

## 2015-12-15 DIAGNOSIS — I130 Hypertensive heart and chronic kidney disease with heart failure and stage 1 through stage 4 chronic kidney disease, or unspecified chronic kidney disease: Secondary | ICD-10-CM | POA: Diagnosis present

## 2015-12-15 DIAGNOSIS — I480 Paroxysmal atrial fibrillation: Secondary | ICD-10-CM

## 2015-12-15 DIAGNOSIS — Z86718 Personal history of other venous thrombosis and embolism: Secondary | ICD-10-CM | POA: Diagnosis not present

## 2015-12-15 DIAGNOSIS — Z87891 Personal history of nicotine dependence: Secondary | ICD-10-CM | POA: Diagnosis not present

## 2015-12-15 DIAGNOSIS — J440 Chronic obstructive pulmonary disease with acute lower respiratory infection: Secondary | ICD-10-CM | POA: Diagnosis present

## 2015-12-15 DIAGNOSIS — I10 Essential (primary) hypertension: Secondary | ICD-10-CM | POA: Diagnosis not present

## 2015-12-15 DIAGNOSIS — Z6841 Body Mass Index (BMI) 40.0 and over, adult: Secondary | ICD-10-CM | POA: Diagnosis not present

## 2015-12-15 DIAGNOSIS — Z833 Family history of diabetes mellitus: Secondary | ICD-10-CM | POA: Diagnosis not present

## 2015-12-15 DIAGNOSIS — N183 Chronic kidney disease, stage 3 (moderate): Secondary | ICD-10-CM | POA: Diagnosis present

## 2015-12-15 DIAGNOSIS — I471 Supraventricular tachycardia: Secondary | ICD-10-CM | POA: Diagnosis present

## 2015-12-15 DIAGNOSIS — G4733 Obstructive sleep apnea (adult) (pediatric): Secondary | ICD-10-CM

## 2015-12-15 DIAGNOSIS — Z7951 Long term (current) use of inhaled steroids: Secondary | ICD-10-CM | POA: Diagnosis not present

## 2015-12-15 DIAGNOSIS — Z7982 Long term (current) use of aspirin: Secondary | ICD-10-CM | POA: Diagnosis not present

## 2015-12-15 DIAGNOSIS — Z7901 Long term (current) use of anticoagulants: Secondary | ICD-10-CM | POA: Diagnosis not present

## 2015-12-15 DIAGNOSIS — Z9981 Dependence on supplemental oxygen: Secondary | ICD-10-CM | POA: Diagnosis not present

## 2015-12-15 DIAGNOSIS — M109 Gout, unspecified: Secondary | ICD-10-CM | POA: Diagnosis present

## 2015-12-15 DIAGNOSIS — E1122 Type 2 diabetes mellitus with diabetic chronic kidney disease: Secondary | ICD-10-CM | POA: Diagnosis present

## 2015-12-15 DIAGNOSIS — I5033 Acute on chronic diastolic (congestive) heart failure: Secondary | ICD-10-CM | POA: Diagnosis present

## 2015-12-15 LAB — BASIC METABOLIC PANEL
Anion gap: 6 (ref 5–15)
BUN: 23 mg/dL — AB (ref 6–20)
CALCIUM: 9 mg/dL (ref 8.9–10.3)
CHLORIDE: 104 mmol/L (ref 101–111)
CO2: 33 mmol/L — ABNORMAL HIGH (ref 22–32)
CREATININE: 1.7 mg/dL — AB (ref 0.61–1.24)
GFR calc Af Amer: 51 mL/min — ABNORMAL LOW (ref >60)
GFR calc non Af Amer: 44 mL/min — ABNORMAL LOW (ref >60)
Glucose, Bld: 107 mg/dL — ABNORMAL HIGH (ref 65–99)
Potassium: 3.6 mmol/L (ref 3.5–5.1)
SODIUM: 143 mmol/L (ref 135–145)

## 2015-12-15 LAB — CBC
HCT: 35.6 % — ABNORMAL LOW (ref 39.0–52.0)
Hemoglobin: 11 g/dL — ABNORMAL LOW (ref 13.0–17.0)
MCH: 27 pg (ref 26.0–34.0)
MCHC: 30.9 g/dL (ref 30.0–36.0)
MCV: 87.5 fL (ref 78.0–100.0)
PLATELETS: 164 10*3/uL (ref 150–400)
RBC: 4.07 MIL/uL — ABNORMAL LOW (ref 4.22–5.81)
RDW: 14.4 % (ref 11.5–15.5)
WBC: 8.7 10*3/uL (ref 4.0–10.5)

## 2015-12-15 LAB — GLUCOSE, CAPILLARY
GLUCOSE-CAPILLARY: 113 mg/dL — AB (ref 65–99)
GLUCOSE-CAPILLARY: 104 mg/dL — AB (ref 65–99)
Glucose-Capillary: 100 mg/dL — ABNORMAL HIGH (ref 65–99)
Glucose-Capillary: 132 mg/dL — ABNORMAL HIGH (ref 65–99)

## 2015-12-15 LAB — HEMOGLOBIN A1C
Hgb A1c MFr Bld: 5.5 % (ref 4.8–5.6)
Mean Plasma Glucose: 111 mg/dL

## 2015-12-15 LAB — HIV ANTIBODY (ROUTINE TESTING W REFLEX): HIV Screen 4th Generation wRfx: NONREACTIVE

## 2015-12-15 LAB — MAGNESIUM: Magnesium: 2.1 mg/dL (ref 1.7–2.4)

## 2015-12-15 MED ORDER — FUROSEMIDE 10 MG/ML IJ SOLN
40.0000 mg | Freq: Two times a day (BID) | INTRAMUSCULAR | Status: DC
  Administered 2015-12-15 – 2015-12-16 (×3): 40 mg via INTRAVENOUS
  Filled 2015-12-15 (×3): qty 4

## 2015-12-15 MED ORDER — TIOTROPIUM BROMIDE MONOHYDRATE 18 MCG IN CAPS
18.0000 ug | ORAL_CAPSULE | Freq: Every day | RESPIRATORY_TRACT | Status: DC
  Administered 2015-12-15 – 2015-12-16 (×2): 18 ug via RESPIRATORY_TRACT
  Filled 2015-12-15: qty 5

## 2015-12-15 MED ORDER — LEVALBUTEROL HCL 1.25 MG/0.5ML IN NEBU
1.2500 mg | INHALATION_SOLUTION | RESPIRATORY_TRACT | Status: DC | PRN
  Filled 2015-12-15: qty 0.5

## 2015-12-15 MED ORDER — METHYLPREDNISOLONE SODIUM SUCC 125 MG IJ SOLR
60.0000 mg | Freq: Two times a day (BID) | INTRAMUSCULAR | Status: DC
  Administered 2015-12-15 – 2015-12-16 (×3): 60 mg via INTRAVENOUS
  Filled 2015-12-15 (×3): qty 2

## 2015-12-15 NOTE — Progress Notes (Addendum)
PROGRESS NOTE  Phillip [name]  HDQ:[id number] DOB: October 04, 1960 DOA: 12/14/2015 PCP: [name], MD  Brief Narrative:  Phillip [name] is a 55 y.o. male with medical history significant of Asthma,? COPD, atrial fibrillation, PE, DVT, diastolic heart failure, OSA, obesity. Patient presented with a one week history of cough and fatigue that suddenly worsened the day of admission.  He had been seen a few weeks prior to admission by Pulmonology and prescribed a prednisone pack which he completed, but he has not gotten back to his baseline.   Hypoxemic on arrival to 85% and currently on nasal cannula O2 at 2 L Chest x-ray is concerning for early pneumonia.  Patient given Solu-Medrol, potassium, albuterol, Atrovent and Levaquin and admitted to COPD exacerbation.   Assessment & Plan:   Principal Problem:   CAP (community acquired pneumonia) Active Problems:   Mild persistent asthma in adult without complication   OSA on CPAP   Acute respiratory failure (HCC)   Essential hypertension   Morbid (severe) obesity due to excess calories (HCC)   SVT (supraventricular tachycardia) (HCC)   Paroxysmal atrial fibrillation (HCC)   Atrial fibrillation ([place]) [I48.91]   Long term (current) use of anticoagulants [Z79.01]   Hx pulmonary embolism   DVT (deep venous thrombosis) (HCC)  Acute respiratory failure with hypoxemia likely secondary to asthma exacerbation, possible pneumonia and possible acute on chronic diastolic heart failure.   His BNP is elevated and patient is up a few pounds. Last EF of 55-60% in March 2017 -Continue Levaquin for treatment of CAP -Continue home inhalers - Increase to Lasix 40 mg daily IV BID - Start solumedrol -  Start spiriva  Hypertension, BP stable -Continue home clonidine and losartan  Atrial fibrillation, generally a-fib with runs of SVT or RVR on telemetry -  Check electrolytes -  Continue clonidine -  Consider addition of CCB if recurrent -  No BB due to  asthma/COPD -  Continue xarelto -  Change albuterol to xopenex  History of pulmonary embolism and DVT -Continue Xarelto   Obstructive sleep apnea -Continue CPAP daily at bedtime  CKD stage III Stable -Repeat BMP in the morning  Morbid obesity Body mass index is 55.5 kg/m.    DVT prophylaxis: Xarelto  Code Status: Full code  Family Communication: None at bedside  Disposition Plan:   Home when able to complete ADLs without severe dyspnea or hypoxia   Consultants:   none  Procedures:  none  Antimicrobials:   Levofloxacin 9/22 >    Subjective:  Feeling much better, but still wheezing at baseline.  Somewhat winded after coming back from the bathroom.  Denies fevers, chills currently, but may have had some at home.    Objective: Vitals:   12/14/15 2240 12/15/15 0632 12/15/15 0958 12/15/15 1100  BP:  137/87    Pulse:  69 (!) 195 73  Resp: 20 16    Temp:  97.6 F (36.4 C)    TempSrc:  Oral    SpO2:  98% 94%   Weight:      Height:        Intake/Output Summary (Last 24 hours) at 12/15/15 1308 Last data filed at 12/15/15 0900  Gross per 24 hour  Intake              700 ml  Output                0 ml  Net  700 ml   Filed Weights   12/14/15 1600  Weight: (!) 175.5 kg (386 lb 12.8 oz)    Examination:  General exam:  Adult male, obese.  No acute distress.  HEENT:  NCAT, MMM Respiratory system:  Diminished bilateral breath sounds throughout, full high-pitched expiratory wheeze, no focal rales or rhonchi Cardiovascular system: IRRR, normal S1/S2. No murmurs, rubs, gallops or clicks.  Warm extremities Gastrointestinal system: Normal active bowel sounds, soft, nondistended, nontender. MSK:  Normal tone and bulk, 1+ bilateral lower extremity edema Neuro:  Grossly intact    Data Reviewed: I have personally reviewed following labs and imaging studies  CBC:  Recent Labs Lab 12/14/15 0327 12/15/15 0527  WBC 4.2 8.7  NEUTROABS 2.5  --     HGB 11.8* 11.0*  HCT 37.5* 35.6*  MCV 87.0 87.5  PLT 179 169   Basic Metabolic Panel:  Recent Labs Lab 12/14/15 0327 12/15/15 0527  NA 142 143  K 3.0* 3.6  CL 103 104  CO2 31 33*  GLUCOSE 109* 107*  BUN 23* 23*  CREATININE 1.77* 1.70*  CALCIUM 8.8* 9.0   GFR: Estimated Creatinine Clearance: 81 mL/min (by C-G formula based on SCr of 1.7 mg/dL (H)). Liver Function Tests: No results for input(s): AST, ALT, ALKPHOS, BILITOT, PROT, ALBUMIN in the last 168 hours. No results for input(s): LIPASE, AMYLASE in the last 168 hours. No results for input(s): AMMONIA in the last 168 hours. Coagulation Profile: No results for input(s): INR, PROTIME in the last 168 hours. Cardiac Enzymes: No results for input(s): CKTOTAL, CKMB, CKMBINDEX, TROPONINI in the last 168 hours. BNP (last 3 results) No results for input(s): PROBNP in the last 8760 hours. HbA1C:  Recent Labs  12/14/15 1153  HGBA1C 5.5   CBG:  Recent Labs Lab 12/14/15 1214 12/14/15 1731 12/14/15 2125 12/15/15 0735 12/15/15 1152  GLUCAP 182* 88 112* 113* 100*   Lipid Profile: No results for input(s): CHOL, HDL, LDLCALC, TRIG, CHOLHDL, LDLDIRECT in the last 72 hours. Thyroid Function Tests:  Recent Labs  12/14/15 1153  TSH 0.632   Anemia Panel: No results for input(s): VITAMINB12, FOLATE, FERRITIN, TIBC, IRON, RETICCTPCT in the last 72 hours. Urine analysis:    Component Value Date/Time   COLORURINE YELLOW 09/08/2015 0050   APPEARANCEUR CLEAR 09/08/2015 0050   LABSPEC 1.014 09/08/2015 0050   PHURINE 5.5 09/08/2015 0050   GLUCOSEU NEGATIVE 09/08/2015 0050   HGBUR NEGATIVE 09/08/2015 0050   BILIRUBINUR NEGATIVE 09/08/2015 0050   KETONESUR NEGATIVE 09/08/2015 0050   PROTEINUR NEGATIVE 09/08/2015 0050   UROBILINOGEN 1.0 12/04/2014 0546   NITRITE NEGATIVE 09/08/2015 0050   LEUKOCYTESUR NEGATIVE 09/08/2015 0050   Sepsis Labs: @LABRCNTIP (procalcitonin:4,lacticidven:4)  )No results found for this or any  previous visit (from the past 240 hour(s)).    Radiology Studies: Dg Chest 2 View  Result Date: 12/14/2015 CLINICAL DATA:  Asthma and shortness of breath for 24 hours. Chest feels tight. EXAM: CHEST  2 VIEW COMPARISON:  10/15/2015 FINDINGS: Shallow inspiration. Cardiac enlargement. Normal pulmonary vascularity. Suggestion of vague increased density in the right upper lung, new since prior study, suggesting possible developing pneumonia in this area. Left lung appears clear and expanded. No blunting of costophrenic angles. No pneumothorax. Mediastinal contours appear intact. IMPRESSION: Possible early infiltration in the right upper lung suggesting developing pneumonia. Electronically Signed   By: Lucienne Capers M.D.   On: 12/14/2015 04:58     Scheduled Meds: . aspirin EC  81 mg Oral QHS  . cloNIDine  0.1 mg Oral TID  . fluticasone furoate-vilanterol  1 puff Inhalation Daily  . furosemide  40 mg Intravenous BID  . hydrALAZINE  50 mg Oral BID  . insulin aspart  0-9 Units Subcutaneous TID WC  . levofloxacin  750 mg Oral Q24H  . losartan  25 mg Oral Daily  . mouth rinse  15 mL Mouth Rinse BID  . methylPREDNISolone (SOLU-MEDROL) injection  60 mg Intravenous BID  . montelukast  10 mg Oral QHS  . rivaroxaban  20 mg Oral Q supper  . sodium chloride flush  3 mL Intravenous Q12H  . sodium chloride flush  3 mL Intravenous Q12H  . tiotropium  18 mcg Inhalation Daily   Continuous Infusions:    LOS: 0 days    Time spent: 30 min    Janece Canterbury, MD [facility] Pager [phone]  If 7PM-7AM, please contact night-coverage [email or web address] Password [email or web address] 12/15/2015, 1:08 PM

## 2015-12-16 DIAGNOSIS — I10 Essential (primary) hypertension: Secondary | ICD-10-CM

## 2015-12-16 DIAGNOSIS — J4530 Mild persistent asthma, uncomplicated: Secondary | ICD-10-CM

## 2015-12-16 LAB — GLUCOSE, CAPILLARY
GLUCOSE-CAPILLARY: 99 mg/dL (ref 65–99)
Glucose-Capillary: 120 mg/dL — ABNORMAL HIGH (ref 65–99)

## 2015-12-16 MED ORDER — CLONIDINE HCL 0.1 MG PO TABS: 0.1000 mg | ORAL_TABLET | Freq: Three times a day (TID) | ORAL | 0 refills | Status: DC

## 2015-12-16 MED ORDER — PREDNISONE 20 MG PO TABS: ORAL_TABLET | ORAL | 0 refills | Status: DC

## 2015-12-16 MED ORDER — TIOTROPIUM BROMIDE MONOHYDRATE 18 MCG IN CAPS: 18.0000 ug | ORAL_CAPSULE | Freq: Every day | RESPIRATORY_TRACT | 0 refills | Status: DC

## 2015-12-16 MED ORDER — LEVOFLOXACIN 750 MG PO TABS: 750.0000 mg | ORAL_TABLET | ORAL | 0 refills | Status: DC

## 2015-12-16 NOTE — Progress Notes (Signed)
SATURATION QUALIFICATIONS: (This note is used to comply with regulatory documentation for home oxygen)  Patient Saturations on Room Air at Rest = 95  Patient Saturations on Room Air while Ambulating = 84  Patient Saturations on  2 Liters of oxygen while Ambulating = 100%  Please briefly explain why patient needs home oxygen: to maintain adequate oxygenation while ambulating

## 2015-12-16 NOTE — Progress Notes (Signed)
Discharge instructions reviewed with patient utilizing teach back method. Advised patient per MD (Dr. Sheran Fava) to wear oxygen at all times until he follows up with Dr. Melvyn Novas in one week. Patient verbalized understanding of these instructions. Patient discharged to home

## 2015-12-16 NOTE — Discharge Summary (Signed)
Physician Discharge Summary  Saunders [place] VZD:[id number] DOB: [birth year] DOA: 12/14/2015  PCP: [name], MD  Admit date: 12/14/2015 Discharge date: 12/16/2015  Admitted From: home  Disposition:  home  Recommendations for Outpatient Follow-up:  1. Follow up with Pulmonology in 1 week.   2. Levofloxacin, Spiriva, and Prednisone prescribed at discharge 3. PCP for routine health maintenance  Home Health:  none  Equipment/Devices:  none  Discharge Condition:  Stable, improved CODE STATUS:  full  Diet recommendation:  Healthy heart   Brief/Interim Summary:  Phillip [name] a 55 y.o.malewith medical history significant of Asthma,? COPD, atrial fibrillation, PE, DVT, diastolic heart failure, OSA, obesity. Patient presented with a one week history of cough and fatigue that suddenly worsened the day of admission.  He had been seen a few weeks prior to admission by Pulmonology and prescribed a prednisone pack which he completed, but he never got back to his baseline.   Hypoxemic on arrival to 85% and currently on nasal cannula O2 at 2L.  Chest x-ray was concerning for early pneumonia.  Patient given Solu-Medrol, albuterol, Atrovent and Levaquin and admitted for COPD exacerbation.  He was also diuresed with lasix 40mg  IV BID.  Ins and outs were not strictly recorded and his weight was unchanged.  Despite no diureses, he had improvement in his dyspnea and tachycardia over two days and was able to ambulate the halls on 2L [place] without significant dyspnea on the date of discharge.    Discharge Diagnoses:  Principal Problem:   CAP (community acquired pneumonia) Active Problems:   Mild persistent asthma in adult without complication   OSA on CPAP   Acute respiratory failure (HCC)   Essential hypertension   Morbid (severe) obesity due to excess calories (HCC)   SVT (supraventricular tachycardia) (HCC)   Paroxysmal atrial fibrillation (HCC)   Atrial fibrillation ([place]) [I48.91]   Long term (current)  use of anticoagulants [Z79.01]   Hx pulmonary embolism   DVT (deep venous thrombosis) (HCC)  Acute respiratory failure with hypoxemia likely secondary to asthma exacerbation, possible pneumonia and possible acute on chronic diastolic heart failure.   His BNP is elevated and patient is up a few pounds. Last EF of 55-60% in March 2017 -Continue Levaquin for treatment of CAP -Continued home inhalers - Resumed torsemide - Prednisone taper with 10mg  daily until follow up with Pulmonology -  Started spiriva -  Close follow up with pulmonology  Hypertension, BP mildly elevated -Continue home clonidine and losartan  Atrial fibrillation, generally a-fib with runs of SVT or RVR on telemetry, rate controlled -  No BB due to asthma/COPD -  Continued xarelto  History of pulmonary embolism and DVT -Continued Xarelto   Obstructive sleep apnea -Continued CPAP nightly  CKD stage III, stable  Morbid obesity Body mass index is 55.5 kg/m.  Discharge Instructions  Discharge Instructions    (HEART FAILURE PATIENTS) Call MD:  Anytime you have any of the following symptoms: 1) 3 pound weight gain in 24 hours or 5 pounds in 1 week 2) shortness of breath, with or without a dry hacking cough 3) swelling in the hands, feet or stomach 4) if you have to sleep on extra pillows at night in order to breathe.    Complete by:  As directed    Call MD for:  difficulty breathing, headache or visual disturbances    Complete by:  As directed    Call MD for:  extreme fatigue    Complete by:  As  directed    Call MD for:  hives    Complete by:  As directed    Call MD for:  persistant dizziness or light-headedness    Complete by:  As directed    Call MD for:  persistant nausea and vomiting    Complete by:  As directed    Call MD for:  severe uncontrolled pain    Complete by:  As directed    Call MD for:  temperature >100.4    Complete by:  As directed    Diet - low sodium heart healthy    Complete by:   As directed    Increase activity slowly    Complete by:  As directed        Medication List    STOP taking these medications   naproxen sodium 220 MG tablet Commonly known as:  ANAPROX     TAKE these medications   albuterol 108 (90 Base) MCG/ACT inhaler Commonly known as:  PROVENTIL HFA;VENTOLIN HFA Inhale 2 puffs into the lungs every 6 (six) hours as needed for shortness of breath.   albuterol (2.5 MG/3ML) 0.083% nebulizer solution Commonly known as:  PROVENTIL Take 3 mLs (2.5 mg total) by nebulization every 4 (four) hours as needed for wheezing or shortness of breath.   aspirin 81 MG tablet Take 81 mg by mouth at bedtime.   cloNIDine 0.1 MG tablet Commonly known as:  CATAPRES Take 1 tablet (0.1 mg total) by mouth 3 (three) times daily.   colchicine 0.6 MG tablet Take 0.6 mg by mouth daily as needed (gout flare up.). For gout   fluticasone furoate-vilanterol 100-25 MCG/INH Aepb Commonly known as:  BREO ELLIPTA Inhale 1 puff into the lungs daily.   hydrALAZINE 50 MG tablet Commonly known as:  APRESOLINE Take 50 mg by mouth 2 (two) times daily.   ibuprofen 200 MG tablet Commonly known as:  ADVIL,MOTRIN Take 200 mg by mouth every 6 (six) hours as needed for moderate pain.   levofloxacin 750 MG tablet Commonly known as:  LEVAQUIN Take 1 tablet (750 mg total) by mouth daily. Start taking on:  12/17/2015   losartan 25 MG tablet Commonly known as:  COZAAR Take 1 tablet (25 mg total) by mouth daily.   predniSONE 20 MG tablet Commonly known as:  DELTASONE Take 3 tabs daily for 2 day, 2 tabs daily x 2 days, then 1 tab daily x 2 days,  Then half tab daily thereafter What changed:  how much to take  how to take this  when to take this  additional instructions   rivaroxaban 20 MG Tabs tablet Commonly known as:  XARELTO Take 1 tablet (20 mg total) by mouth daily with supper.   SINGULAIR 10 MG tablet Generic drug:  montelukast Take 10 mg by mouth at  bedtime.   tiotropium 18 MCG inhalation capsule Commonly known as:  SPIRIVA Place 1 capsule (18 mcg total) into inhaler and inhale daily. Start taking on:  12/17/2015   torsemide 20 MG tablet Commonly known as:  DEMADEX Take 40 mg by mouth daily.      Follow-up Information    Christinia Gully, MD. Schedule an appointment as soon as possible for a visit in 1 week(s).   Specialty:  Pulmonary Disease Contact information: [address] [address] [phone]        [name], MD Follow up in 2 week(s).   Specialty:  Internal Medicine Why:  as needed Contact information:  [place] [address] [phone]          No Known Allergies  Consultations: none   Procedures/Studies: Dg Chest 2 View  Result Date: 12/14/2015 CLINICAL DATA:  Asthma and shortness of breath for 24 hours. Chest feels tight. EXAM: CHEST  2 VIEW COMPARISON:  10/15/2015 FINDINGS: Shallow inspiration. Cardiac enlargement. Normal pulmonary vascularity. Suggestion of vague increased density in the right upper lung, new since prior study, suggesting possible developing pneumonia in this area. Left lung appears clear and expanded. No blunting of costophrenic angles. No pneumothorax. Mediastinal contours appear intact. IMPRESSION: Possible early infiltration in the right upper lung suggesting developing pneumonia. Electronically Signed   By: Lucienne Capers M.D.   On: 12/14/2015 04:58    Subjective: Feeling much better.  Chest tightness has improved.  Walked in halls and is feeling near baseline today.  Asking to go home.    Discharge Exam: Vitals:   12/16/15 0511 12/16/15 0846  BP: (!) 165/71 (!) 169/79  Pulse: 62 88  Resp: 18   Temp: 97.9 F (36.6 C)    Vitals:   12/15/15 2115 12/16/15 0511 12/16/15 0745 12/16/15 0846  BP: (!) 174/81 (!) 165/71  (!) 169/79  Pulse: 63 62  88  Resp: 18 18    Temp: 98.4 F (36.9 C) 97.9 F (36.6 C)    TempSrc: Oral Oral     SpO2: 98% 92% 97% 94%  Weight:  (!) 175.4 kg (386 lb 11 oz)    Height:        General exam:  Adult male, obese.  No acute distress.  HEENT:  NCAT, MMM Respiratory system:  Diminished bilateral breath sounds throughout, wheeze resolved, no focal rales or rhonchi Cardiovascular system: IRRR, normal S1/S2. No murmurs, rubs, gallops or clicks.  Warm extremities Gastrointestinal system: Normal active bowel sounds, soft, nondistended, nontender. MSK:  Normal tone and bulk, trace bilateral lower extremity edema Neuro:  Grossly intact    The results of significant diagnostics from this hospitalization (including imaging, microbiology, ancillary and laboratory) are listed below for reference.     Microbiology: Recent Results (from the past 240 hour(s))  Culture, blood (routine x 2)     Status: None (Preliminary result)   Collection Time: 12/14/15  5:20 AM  Result Value Ref Range Status   Specimen Description BLOOD RIGHT HAND  Final   Special Requests BOTTLES DRAWN AEROBIC AND ANAEROBIC 5ML  Final   Culture NO GROWTH 1 DAY  Final   Report Status PENDING  Incomplete  Culture, blood (routine x 2)     Status: None (Preliminary result)   Collection Time: 12/14/15  5:30 AM  Result Value Ref Range Status   Specimen Description BLOOD LEFT ANTECUBITAL  Final   Special Requests BOTTLES DRAWN AEROBIC AND ANAEROBIC 5ML  Final   Culture NO GROWTH 1 DAY  Final   Report Status PENDING  Incomplete  Culture, blood (routine x 2) Call MD if unable to obtain prior to antibiotics being given     Status: None (Preliminary result)   Collection Time: 12/14/15 11:48 AM  Result Value Ref Range Status   Specimen Description BLOOD RIGHT HAND  Final   Special Requests BOTTLES DRAWN AEROBIC AND ANAEROBIC 5ML  Final   Culture NO GROWTH 1 DAY  Final   Report Status PENDING  Incomplete  Culture, blood (routine x 2) Call MD if unable to obtain prior to antibiotics being given     Status: None (Preliminary result)  Collection Time: 12/14/15 11:58 AM  Result Value Ref Range Status   Specimen Description BLOOD LEFT ANTECUBITAL  Final   Special Requests BOTTLES DRAWN AEROBIC ONLY 10ML  Final   Culture NO GROWTH 1 DAY  Final   Report Status PENDING  Incomplete     Labs: BNP (last 3 results)  Recent Labs  06/20/15 1825 07/04/15 2109 12/14/15 0327  BNP 394.0* 321.1* 403.4*   Basic Metabolic Panel:  Recent Labs Lab 12/14/15 0327 12/15/15 0527  NA 142 143  K 3.0* 3.6  CL 103 104  CO2 31 33*  GLUCOSE 109* 107*  BUN 23* 23*  CREATININE 1.77* 1.70*  CALCIUM 8.8* 9.0  MG  --  2.1   Liver Function Tests: No results for input(s): AST, ALT, ALKPHOS, BILITOT, PROT, ALBUMIN in the last 168 hours. No results for input(s): LIPASE, AMYLASE in the last 168 hours. No results for input(s): AMMONIA in the last 168 hours. CBC:  Recent Labs Lab 12/14/15 0327 12/15/15 0527  WBC 4.2 8.7  NEUTROABS 2.5  --   HGB 11.8* 11.0*  HCT 37.5* 35.6*  MCV 87.0 87.5  PLT 179 164   Cardiac Enzymes: No results for input(s): CKTOTAL, CKMB, CKMBINDEX, TROPONINI in the last 168 hours. BNP: Invalid input(s): POCBNP CBG:  Recent Labs Lab 12/15/15 0735 12/15/15 1152 12/15/15 1644 12/15/15 2105 12/16/15 0758  GLUCAP 113* 100* 132* 104* 120*   D-Dimer No results for input(s): DDIMER in the last 72 hours. Hgb A1c  Recent Labs  12/14/15 1153  HGBA1C 5.5   Lipid Profile No results for input(s): CHOL, HDL, LDLCALC, TRIG, CHOLHDL, LDLDIRECT in the last 72 hours. Thyroid function studies  Recent Labs  12/14/15 1153  TSH 0.632   Anemia work up No results for input(s): VITAMINB12, FOLATE, FERRITIN, TIBC, IRON, RETICCTPCT in the last 72 hours. Urinalysis    Component Value Date/Time   COLORURINE YELLOW 09/08/2015 0050   APPEARANCEUR CLEAR 09/08/2015 0050   LABSPEC 1.014 09/08/2015 0050   PHURINE 5.5 09/08/2015 0050   GLUCOSEU NEGATIVE 09/08/2015 0050   HGBUR NEGATIVE 09/08/2015 0050    BILIRUBINUR NEGATIVE 09/08/2015 0050   KETONESUR NEGATIVE 09/08/2015 0050   PROTEINUR NEGATIVE 09/08/2015 0050   UROBILINOGEN 1.0 12/04/2014 0546   NITRITE NEGATIVE 09/08/2015 0050   LEUKOCYTESUR NEGATIVE 09/08/2015 0050   Sepsis Labs Invalid input(s): PROCALCITONIN,  WBC,  LACTICIDVEN   Time coordinating discharge: Over 30 minutes  SIGNED:   Janece Canterbury, MD  [facility] 12/16/2015, 11:09 AM Pager   If 7PM-7AM, please contact night-coverage [email or web address] Password [email or web address]

## 2015-12-16 NOTE — Progress Notes (Signed)
Ambulated patient in hallway approximately 300 ft on room air oxygen level dropped to 84%. Patient denied shortness of breath. The highest amount of beats per minute while ambulating was 126. Patient tolerated well oxygen applied @ 2 liters via nasal cannula oxygen  sat returned 98 %

## 2015-12-19 LAB — CULTURE, BLOOD (ROUTINE X 2)
CULTURE: NO GROWTH
CULTURE: NO GROWTH
CULTURE: NO GROWTH
Culture: NO GROWTH

## 2015-12-27 ENCOUNTER — Ambulatory Visit (INDEPENDENT_AMBULATORY_CARE_PROVIDER_SITE_OTHER): Payer: Managed Care, Other (non HMO) | Admitting: Internal Medicine

## 2015-12-27 ENCOUNTER — Encounter: Payer: Self-pay | Admitting: Internal Medicine

## 2015-12-27 VITALS — BP 138/76 | HR 86 | Ht 70.0 in | Wt 387.0 lb

## 2015-12-27 DIAGNOSIS — J9601 Acute respiratory failure with hypoxia: Secondary | ICD-10-CM | POA: Diagnosis not present

## 2015-12-27 DIAGNOSIS — J4530 Mild persistent asthma, uncomplicated: Secondary | ICD-10-CM

## 2015-12-27 MED ORDER — ALBUTEROL SULFATE HFA 108 (90 BASE) MCG/ACT IN AERS: INHALATION_SPRAY | RESPIRATORY_TRACT | 11 refills | Status: DC

## 2015-12-27 MED ORDER — FLUTICASONE FUROATE-VILANTEROL 100-25 MCG/INH IN AEPB: 1.0000 | INHALATION_SPRAY | Freq: Every day | RESPIRATORY_TRACT | 0 refills | Status: DC

## 2015-12-27 NOTE — Patient Instructions (Addendum)
Plan A = Automatic =  BREO/ Spiriva daily on rising to start your day regardless of what time of the day it is   Plan B = Backup Only use your albuterol (proair) as a rescue medication to be used if you can't catch your breath by resting or doing a relaxed purse lip breathing pattern.  - The less you use it, the better it will work when you need it. - Ok to use the inhaler up to 2 puffs  every 4 hours if you must but call for appointment if use goes up over your usual need - Don't leave home without it !!  (think of it like the spare tire for your car)   Plan C = Crisis - only use your albuterol nebulizer if you first try Plan B and it fails to help > ok to use the nebulizer up to every 4 hours but if start needing it regularly call for immediate appointment   Keep your appointment for pfts and follow up here same day

## 2015-12-27 NOTE — Progress Notes (Signed)
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  at wt 240 and maintained that wt of < 260 up until retired in 1999 with smoke exp in [place] at "79% lung capacity" per pulmonologist on prn saba with progressive wt gain since then assoc with progessive doe so self referred to pulmonary clinic 07/24/2015 sp admit:   Admit date: 07/04/2015 Discharge date: 07/08/2015   Discharge Diagnoses:  Principal Problem:  Influenza with pneumonia Active Problems:  OSA on CPAP  Hypokalemia  Essential hypertension with nl echo 06/21/15   Elevated troponin  New rx: neb albuterol/ maint on coreg   07/24/2015 1st [facility] office visit/ Phillip [name]   Chief Complaint  Patient presents with  . Pulmonary Consult    self ref.-sob with exertion x 5 yrs.,wt. gain,[facility] last mth. with flu/pneumonia/CHF,cough-yellow,occass. wheezing,Using ventolin 1 x this wk.has neb. machine used 2x last wk.,midchest tightness,  Peak wt 429 at [facility] with pna and breathing improved since then  Back to baseline = 100 yards s stopping and be tired at slower [facility] = can't walk a nl pace on a flat grade s sob but does fine slow and flat eg shopping ok/ crosses parking lot  Can't go up steps at all s stopping multiple times  Sleeping fine on cpap per PC Has breo but prefers to use saba  rec Plan A = Automatic = BREO  Take one click each am - one or two smooth deep drags  Plan B = Backup Only use your albuterol as a rescue medication to be used if you can't catch your breath by resting or doing a relaxed purse lip breathing pattern.  - The less you use it, the better it will work when you need it. - Ok to use the inhaler up to 2 puffs  every 4 hours if you must but call for appointment if use goes up over your usual need - Don't leave home without it !!  (think of it like the spare tire for your  car)  Plan C = Crisis - only use your albuterol nebulizer if you first try Plan B and it fails to help > ok to use the nebulizer up to every 4 hours but if start needing it regularly call for immediate appointment Please schedule a follow up office visit in 6 weeks, call sooner if needed with all inhalers in hand     09/10/2015  f/u ov/Phillip [name] re:  Morbid obesity ? Asthma just on singulair and  prn saba  - no longer on coreg Chief Complaint  Patient presents with  . Follow-up    Pt states that his breathing is much improved. He has not used breo or albuterol for the past 3 wks.   improving ex tol / on NOAC for paf and h/o dvt L  rec Weight control via neg cal bal  If breathing starts getting worse or need more than twice weekly albuterol, resume the BREO one click each am Continue daily singulair (montelukast)  Return with full pfts    11/29/15  Acute flare  Take the prednisone as prescribed Take over-the-counter guaifenesin 600 mg twice a day Take Breo daily Use your albuterol as needed for chest tightness and shortness of breath We will prescribe a new CPAP machine    Admit date:  12/14/2015 Discharge date: 12/16/2015  Admitted From: home  Disposition:  home  Recommendations for Outpatient Follow-up:  1. Follow up with Pulmonology in 1 week.   2. Levofloxacin, Spiriva, and Prednisone prescribed at discharge 3. PCP for routine health maintenance  Home Health:  none  Equipment/Devices:  none  Discharge Condition:  Stable, improved CODE STATUS:  full  Diet recommendation:  Healthy heart   Brief/Interim Summary:  Phillip [name] a 55 y.o.malewith medical history significant of Asthma,? COPD, atrial fibrillation, PE, DVT, diastolic heart failure, OSA, obesity. Patient presented with a one week history of cough and fatigue that suddenly worsened the day of admission. He had been seen a few weeks prior to admission by Pulmonology and prescribed a prednisone pack which he  completed, but he never got back to his baseline. Hypoxemic on arrival to 85% and currently on nasal cannula O2 at 2L.  Chest x-ray was concerning for early pneumonia. Patient given Solu-Medrol, albuterol, Atrovent and Levaquin and admitted for COPD exacerbation.  He was also diuresed with lasix 40mg  IV BID.  Ins and outs were not strictly recorded and his weight was unchanged.  Despite no diureses, he had improvement in his dyspnea and tachycardia over two days and was able to ambulate the halls on 2L [place] without significant dyspnea on the date of discharge.    Discharge Diagnoses:  Principal Problem:   CAP (community acquired pneumonia) Active Problems:   Mild persistent asthma in adult without complication   OSA on CPAP   Acute respiratory failure (HCC)   Essential hypertension   Morbid (severe) obesity due to excess calories (HCC)   SVT (supraventricular tachycardia) (HCC)   Paroxysmal atrial fibrillation (HCC)   Atrial fibrillation ([place]) [I48.91]   Long term (current) use of anticoagulants [Z79.01]   Hx pulmonary embolism   DVT (deep venous thrombosis) (HCC)  Acute respiratory failure with hypoxemia likely secondary to asthma exacerbation, possible pneumonia and possible acute on chronic diastolic heart failure.His BNP is elevated and patient is up a few pounds. Last EF of 55-60% in March 2017 -Continue Levaquin for treatment of CAP -Continued home inhalers - Resumed torsemide - Prednisone taper with 10mg  daily until follow up with Pulmonology - Started spiriva -  Close follow up with pulmonology  Hypertension, BP mildly elevated -Continue home clonidine and losartan  Atrial fibrillation, generally a-fib with runs of SVT or RVR on telemetry, rate controlled - No BB due to asthma/COPD - Continued xarelto  History of pulmonary embolism and DVT -Continued Xarelto   Obstructive sleep apnea -Continued CPAP nightly  CKD stage III, stable  Morbid obesity Body  mass index is 55.5 kg/m    12/27/2015  f/u ov/Phillip [name] re: post hosp f/u transition of care  Chief Complaint  Patient presents with  . Hospitalization Follow-up    Breathing is "so so"- not back to baseline since hospital d/c. He is not wheezing and has very little cough.   typically wakes up 10 am then breo/ spiriva and no need for rescue at all  Has 02 to use only as needed cpap x years but not clear under what HCP's direction  No obvious day to day or daytime variability or assoc excess/ purulent sputum or mucus plugs or hemoptysis or cp or chest tightness, subjective wheeze or overt sinus or hb symptoms. No unusual exp hx or h/o childhood pna/ asthma or knowledge of premature birth.  Sleeping ok without nocturnal  or early am exacerbation  of respiratory  c/o's or need  for noct saba. Also denies any obvious fluctuation of symptoms with weather or environmental changes or other aggravating or alleviating factors except as outlined above   Current Medications, Allergies, Complete Past Medical History, Past Surgical History, Family History, and Social History were reviewed in [employer] record.  ROS  The following are not active complaints unless bolded sore throat, dysphagia, dental problems, itching, sneezing,  nasal congestion or excess/ purulent secretions, ear ache,   fever, chills, sweats, unintended wt loss, classically pleuritic or exertional cp,  orthopnea pnd or leg swelling, presyncope, palpitations, abdominal pain, anorexia, nausea, vomiting, diarrhea  or change in bowel or bladder habits, change in stools or urine, dysuria,hematuria,  rash, arthralgias, visual complaints, headache, numbness, weakness or ataxia or problems with walking or coordination,  change in mood/affect or memory.                     Objective:   Physical Exam    massively obese bm nad  12/27/2015        387   09/10/2015       372   07/24/15 389 lb 6.4 oz (176.631 kg)   07/08/15 384 lb 3.2 oz (174.272 kg)  06/21/15 390 lb 8 oz (177.13 kg)    Vital signs reviewed - note sats 93% RA on arrival   HEENT: nl dentition, turbinates, and oropharynx. Nl external ear canals without cough reflex   NECK :  without JVD/Nodes/TM/ nl carotid upstrokes bilaterally   LUNGS: no acc muscle use,  Nl contour chest which is clear to A and P bilaterally without cough on insp or exp maneuvers   CV:  RRR  no s3 or murmur or increase in P2, no edema   ABD:  soft and nontender with nl inspiratory excursion in the supine position. No bruits or organomegaly, bowel sounds nl  MS:  Nl gait/ ext warm without deformities, calf tenderness, cyanosis or clubbing No obvious joint restrictions   SKIN: warm and dry without lesions    NEURO:  alert, approp, nl sensorium with  no motor deficits        I personally reviewed images and agree with radiology impression as follows:  CXR:   12/14/15 Shallow inspiration. Cardiac enlargement. Normal pulmonary vascularity. Suggestion of vague increased density in the right upper lung, new since prior study, suggesting possible developing pneumonia in this area. Left lung appears clear and expanded. No blunting of costophrenic angles. No pneumothorax. Mediastinal contours appear intact. My impression: R hemithorax density explained by body habitus    Assessment:

## 2015-12-28 ENCOUNTER — Encounter: Payer: Self-pay | Admitting: Internal Medicine

## 2015-12-28 NOTE — Assessment & Plan Note (Addendum)
singulair maint rx as of 09/10/2015 with NO = 31 / no saba or other controller needed  - admit 12/14/15 with flare ? Assoc with chf/ cap (doubt latter) - clearly though this is not copd and probably could have been avoided had he initiated the breo as per previous instructions  I had an extended discussion with the patient reviewing all relevant studies(including extensives hosp records)  completed to date and  lasting 25 minutes of a 40  minute transition of care office  visit    Each maintenance medication was reviewed in detail including most importantly the difference between maintenance and prns and under what circumstances the prns are to be triggered using an action plan format that is not reflected in the computer generated alphabetically organized AVS.    Please see instructions for details which were reviewed in writing and the patient given a copy highlighting the part that I personally wrote and discussed at today's ov.

## 2015-12-28 NOTE — Assessment & Plan Note (Signed)
Body mass index is 55.53  Trending back up  Lab Results  Component Value Date   TSH 0.632 12/14/2015     Contributing to gerd tendency/ doe/reviewed the need and the process to achieve and maintain neg calorie balance > defer f/u primary care including intermittently monitoring thyroid status

## 2015-12-28 NOTE — Assessment & Plan Note (Signed)
Likely multifactorial/ resolved

## 2015-12-31 ENCOUNTER — Encounter: Payer: Self-pay | Admitting: Pulmonary Disease

## 2016-01-21 ENCOUNTER — Encounter: Payer: Self-pay | Admitting: Internal Medicine

## 2016-01-21 ENCOUNTER — Encounter (INDEPENDENT_AMBULATORY_CARE_PROVIDER_SITE_OTHER): Payer: Managed Care, Other (non HMO) | Admitting: Internal Medicine

## 2016-01-21 ENCOUNTER — Ambulatory Visit (INDEPENDENT_AMBULATORY_CARE_PROVIDER_SITE_OTHER): Payer: Managed Care, Other (non HMO) | Admitting: Internal Medicine

## 2016-01-21 ENCOUNTER — Ambulatory Visit (INDEPENDENT_AMBULATORY_CARE_PROVIDER_SITE_OTHER)
Admission: RE | Admit: 2016-01-21 | Discharge: 2016-01-21 | Disposition: A | Discharge status: Home / Self Care | Payer: Managed Care, Other (non HMO) | Source: Ambulatory Visit | Attending: Internal Medicine | Admitting: Internal Medicine

## 2016-01-21 VITALS — BP 140/82 | HR 86 | Ht 67.25 in | Wt 390.0 lb

## 2016-01-21 DIAGNOSIS — J40 Bronchitis, not specified as acute or chronic: Secondary | ICD-10-CM

## 2016-01-21 DIAGNOSIS — J4530 Mild persistent asthma, uncomplicated: Secondary | ICD-10-CM | POA: Diagnosis not present

## 2016-01-21 LAB — PULMONARY FUNCTION TEST
DL/VA % PRED: 120 %
DL/VA: 5.37 ml/min/mmHg/L
DLCO UNC: 21.12 ml/min/mmHg
DLCO cor % pred: 71 %
DLCO cor: 20.61 ml/min/mmHg
DLCO unc % pred: 73 %
FEF 25-75 Post: 2.95 L/sec
FEF 25-75 Pre: 1.8 L/sec
FEF2575-%CHANGE-POST: 63 %
FEF2575-%PRED-POST: 100 %
FEF2575-%Pred-Pre: 61 %
FEV1-%CHANGE-POST: 12 %
FEV1-%Pred-Post: 71 %
FEV1-%Pred-Pre: 63 %
FEV1-PRE: 1.87 L
FEV1-Post: 2.11 L
FEV1FVC-%Change-Post: 5 %
FEV1FVC-%Pred-Pre: 100 %
FEV6-%Change-Post: 7 %
FEV6-%PRED-POST: 69 %
FEV6-%Pred-Pre: 65 %
FEV6-PRE: 2.36 L
FEV6-Post: 2.52 L
FEV6FVC-%CHANGE-POST: 0 %
FEV6FVC-%PRED-PRE: 102 %
FEV6FVC-%Pred-Post: 103 %
FVC-%CHANGE-POST: 6 %
FVC-%Pred-Post: 67 %
FVC-%Pred-Pre: 63 %
FVC-Post: 2.52 L
FVC-Pre: 2.36 L
POST FEV1/FVC RATIO: 84 %
PRE FEV6/FVC RATIO: 100 %
Post FEV6/FVC ratio: 100 %
Pre FEV1/FVC ratio: 79 %
RV % PRED: 67 %
RV: 1.32 L
TLC % pred: 63 %
TLC: 4.07 L

## 2016-01-21 MED ORDER — BUDESONIDE-FORMOTEROL FUMARATE 160-4.5 MCG/ACT IN AERO: 2.0000 | INHALATION_SPRAY | Freq: Two times a day (BID) | RESPIRATORY_TRACT | 0 refills | Status: DC

## 2016-01-21 MED ORDER — BUDESONIDE-FORMOTEROL FUMARATE 160-4.5 MCG/ACT IN AERO: 2.0000 | INHALATION_SPRAY | Freq: Two times a day (BID) | RESPIRATORY_TRACT | 11 refills | Status: DC

## 2016-01-21 NOTE — Progress Notes (Signed)
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 and maintained that wt of < 260 up until retired in 1999 with smoke exp in [place] at "79% lung capacity" per pulmonologist on prn saba with progressive wt gain since then assoc with progessive doe so self referred to pulmonary clinic 07/24/2015 sp admit:   Admit date: 07/04/2015 Discharge date: 07/08/2015   Discharge Diagnoses:  Principal Problem:  Influenza with pneumonia Active Problems:  OSA on CPAP  Hypokalemia  Essential hypertension with nl echo 06/21/15   Elevated troponin  New rx: neb albuterol/ maint on coreg   07/24/2015 1st [facility] office visit/ Phillip [name]   Chief Complaint  Patient presents with  . Pulmonary Consult    self ref.-sob with exertion x 5 yrs.,wt. gain,[facility] last mth. with flu/pneumonia/CHF,cough-yellow,occass. wheezing,Using ventolin 1 x this wk.has neb. machine used 2x last wk.,midchest tightness,  Peak wt 429 at [facility] with pna and breathing improved since then  Back to baseline = 100 yards s stopping and be tired at slower [facility] = can't walk a nl pace on a flat grade s sob but does fine slow and flat eg shopping ok/ crosses parking lot  Can't go up steps at all s stopping multiple times  Sleeping fine on cpap per PC Has breo but prefers to use saba  rec Plan A = Automatic = BREO  Take one click each am - one or two smooth deep drags  Plan B = Backup Only use your albuterol as a rescue medication to be used if you can't catch your breath by resting or doing a relaxed purse lip breathing pattern.  - The less you use it, the better it will work when you need it. - Ok to use the inhaler up to 2 puffs  every 4 hours if you must but call for appointment if use goes up over your usual need - Don't leave home without it !!  (think of it like the spare tire for your  car)  Plan C = Crisis - only use your albuterol nebulizer if you first try Plan B and it fails to help > ok to use the nebulizer up to every 4 hours but if start needing it regularly call for immediate appointment Please schedule a follow up office visit in 6 weeks, call sooner if needed with all inhalers in hand     09/10/2015  f/u ov/Phillip [name] re:  Morbid obesity ? Asthma just on singulair and  prn saba  - no longer on coreg Chief Complaint  Patient presents with  . Follow-up    Pt states that his breathing is much improved. He has not used breo or albuterol for the past 3 wks.   improving ex tol / on NOAC for paf and h/o dvt L  rec Weight control via neg cal bal  If breathing starts getting worse or need more than twice weekly albuterol, resume the BREO one click each am Continue daily singulair (montelukast)  Return with full pfts    11/29/15  Acute flare  Take the prednisone as prescribed Take over-the-counter guaifenesin 600 mg twice a day Take Breo daily Use your albuterol as needed for chest tightness and shortness of breath We will prescribe a new CPAP machine    Admit date:  12/14/2015 Discharge date: 12/16/2015   Recommendations for Outpatient Follow-up:  1. Follow up with Pulmonology in 1 week.   2. Levofloxacin, Spiriva, and Prednisone prescribed at discharge 3. PCP for routine health maintenance  Home Health:  none  Equipment/Devices:  none  Discharge Condition:  Stable, improved CODE STATUS:  full  Diet recommendation:  Healthy heart   Brief/Interim Summary:  Phillip [name] a 55 y.o.malewith medical history significant of Asthma,? COPD, atrial fibrillation, PE, DVT, diastolic heart failure, OSA, obesity. Patient presented with a one week history of cough and fatigue that suddenly worsened the day of admission. He had been seen a few weeks prior to admission by Pulmonology and prescribed a prednisone pack which he completed, but he never got back to his  baseline. Hypoxemic on arrival to 85% and currently on nasal cannula O2 at 2L.  Chest x-ray was concerning for early pneumonia. Patient given Solu-Medrol, albuterol, Atrovent and Levaquin and admitted for COPD exacerbation.  He was also diuresed with lasix 40mg  IV BID.  Ins and outs were not strictly recorded and his weight was unchanged.  Despite no diureses, he had improvement in his dyspnea and tachycardia over two days and was able to ambulate the halls on 2L [name] without significant dyspnea on the date of discharge.    Discharge Diagnoses:  Principal Problem:   CAP (community acquired pneumonia) Active Problems:   Mild persistent asthma in adult without complication   OSA on CPAP   Acute respiratory failure (HCC)   Essential hypertension   Morbid (severe) obesity due to excess calories (HCC)   SVT (supraventricular tachycardia) (HCC)   Paroxysmal atrial fibrillation (HCC)   Atrial fibrillation ([place]) [I48.91]   Long term (current) use of anticoagulants [Z79.01]   Hx pulmonary embolism   DVT (deep venous thrombosis) (HCC)  Acute respiratory failure with hypoxemia likely secondary to asthma exacerbation, possible pneumonia and possible acute on chronic diastolic heart failure.His BNP is elevated and patient is up a few pounds. Last EF of 55-60% in March 2017 -Continue Levaquin for treatment of CAP -Continued home inhalers - Resumed torsemide - Prednisone taper with 10mg  daily until follow up with Pulmonology - Started spiriva -  Close follow up with pulmonology  Hypertension, BP mildly elevated -Continue home clonidine and losartan  Atrial fibrillation, generally a-fib with runs of SVT or RVR on telemetry, rate controlled - No BB due to asthma/COPD - Continued xarelto  History of pulmonary embolism and DVT -Continued Xarelto   Obstructive sleep apnea -Continued CPAP nightly  CKD stage III, stable  Morbid obesity Body mass index is 55.5 kg/m    12/27/2015   f/u ov/Phillip [name] re: post hosp f/u transition of care  Chief Complaint  Patient presents with  . Hospitalization Follow-up    Breathing is "so so"- not back to baseline since hospital d/c. He is not wheezing and has very little cough.   typically wakes up 10 am then breo/ spiriva and no need for rescue at all  Has 02 to use only as needed cpap x years but not clear under whose direction rec Plan A = Automatic =  BREO/ Spiriva daily on rising to start your day regardless of what time of the day it is  Plan B = Backup Only use your albuterol (proair)  Plan C = Crisis - only use your albuterol nebulizer if you first try Plan B and it fails to help > ok to use the nebulizer up to every 4 hours but if start  needing it regularly call for immediate appointment Keep your appointment for pfts and follow up here same day      01/21/2016  f/u ov/Berdell Hostetler re: asthma/ breo 100and no spiriva since 01/19/16  Has 02 not using  Chief Complaint  Patient presents with  . Follow-up    PFT's done today. Breathing is unchanged. He has minimal non prod cough. He has not had to use albuterol inhaler or neb.   sleeping ok on new cpap machine from aps  - not limited by breathing from desired activities  But very sedentary/ last pred 01/21/2016    No obvious day to day or daytime variability or assoc excess/ purulent sputum or mucus plugs or hemoptysis or cp or chest tightness, subjective wheeze or overt sinus or hb symptoms. No unusual exp hx or h/o childhood pna/ asthma or knowledge of premature birth.  Sleeping ok without nocturnal  or early am exacerbation  of respiratory  c/o's or need for noct saba. Also denies any obvious fluctuation of symptoms with weather or environmental changes or other aggravating or alleviating factors except as outlined above   Current Medications, Allergies, Complete Past Medical History, Past Surgical History, Family History, and Social History were reviewed in [employer] record.  ROS  The following are not active complaints unless bolded sore throat, dysphagia, dental problems, itching, sneezing,  nasal congestion or excess/ purulent secretions, ear ache,   fever, chills, sweats, unintended wt loss, classically pleuritic or exertional cp,  orthopnea pnd or leg swelling, presyncope, palpitations, abdominal pain, anorexia, nausea, vomiting, diarrhea  or change in bowel or bladder habits, change in stools or urine, dysuria,hematuria,  rash, arthralgias, visual complaints, headache, numbness, weakness or ataxia or problems with walking or coordination,  change in mood/affect or memory.                     Objective:   Physical Exam    massively obese bm nad  01/21/2016      390  12/27/2015        387   09/10/2015       372   07/24/15 389 lb 6.4 oz (176.631 kg)  07/08/15 384 lb 3.2 oz (174.272 kg)  06/21/15 390 lb 8 oz (177.13 kg)    Vital signs reviewed -  - Note on arrival 02 sats  93% on RA    HEENT: nl dentition, turbinates, and oropharynx. Nl external ear canals without cough reflex   NECK :  without JVD/Nodes/TM/ nl carotid upstrokes bilaterally   LUNGS: no acc muscle use,  Nl contour chest which is clear to A and P bilaterally without cough on insp or exp maneuvers   CV:  RRR  no s3 or murmur or increase in P2, no edema   ABD:  soft and nontender with nl inspiratory excursion in the supine position. No bruits or organomegaly, bowel sounds nl  MS:  Nl gait/ ext warm without deformities, calf tenderness, cyanosis or clubbing No obvious joint restrictions   SKIN: warm and dry without lesions    NEURO:  alert, approp, nl sensorium with  no motor deficits       CXR PA and Lateral:   01/21/2016 :    I personally reviewed images and agree with radiology impression as follows:   FINDINGS: Cardiomegaly with normal pulmonary vascularity. No focal infiltrate. Previously identified right upper lobe infiltrate is clear.  No pleural effusion or pneumothorax. Bilateral apical pleural thickening noted most  consistent with scarring. No acute bony abnormality .     Assessment:

## 2016-01-21 NOTE — Patient Instructions (Addendum)
We need a download from your cpap machine and we will be referring you to a sleep specialist if needed   Stop Breo and start symbicort 160 Take 2 puffs first thing in am and then another 2 puffs about 12 hours later.   Please remember to go to the  x-ray department downstairs for your tests - we will call you with the results when they are available.     Please schedule a follow up visit in 3 months but call sooner if needed

## 2016-01-21 NOTE — Assessment & Plan Note (Signed)
ERV  01/21/2016  = 24%  Body mass index is 60.63   Lab Results  Component Value Date   TSH 0.632 12/14/2015     Contributing to gerd tendency/ doe/reviewed the need and the process to achieve and maintain neg calorie balance > defer f/u primary care including intermittently monitoring thyroid status

## 2016-01-21 NOTE — Assessment & Plan Note (Addendum)
singulair maint rx as of 09/10/2015 with NO = 31 / no saba or other controller needed  - admit 12/14/15 with flare ? Assoc with chf/ cap - PFT's  01/21/2016  FEV1 2.11 (71 % ) ratio 84  p 12 % improvement from saba p breo 100 and on last day of pred taper  prior to study with DLCO  73/82  % corrects to 120 % for alv volume    - 01/21/2016  After extensive coaching HFA effectiveness =    90% > change to symbicort 160 2bid   pfts show completely nl p saba but that's after am breo and pred so needs a change from breo/ does not have copd so ok to leave off spiriva   I had an extended discussion with the patient reviewing all relevant studies completed to date and  lasting 15 to 20 minutes of a 25 minute visit    Each maintenance medication was reviewed in detail including most importantly the difference between maintenance and prns and under what circumstances the prns are to be triggered using an action plan format that is not reflected in the computer generated alphabetically organized AVS.    Please see instructions for details which were reviewed in writing and the patient given a copy highlighting the part that I personally wrote and discussed at today's ov.

## 2016-01-22 NOTE — Progress Notes (Signed)
Spoke with pt and notified of results per Dr. Wert. Pt verbalized understanding and denied any questions. 

## 2016-01-25 ENCOUNTER — Encounter: Payer: Self-pay | Admitting: Internal Medicine

## 2016-02-04 ENCOUNTER — Inpatient Hospital Stay (HOSPITAL_COMMUNITY)
Admission: EM | Admit: 2016-02-04 | Discharge: 2016-02-07 | DRG: 291 | Disposition: A | Discharge status: Home / Self Care | Payer: Managed Care, Other (non HMO) | Attending: Internal Medicine | Admitting: Internal Medicine

## 2016-02-04 ENCOUNTER — Emergency Department (HOSPITAL_COMMUNITY): Payer: Managed Care, Other (non HMO)

## 2016-02-04 ENCOUNTER — Encounter (HOSPITAL_COMMUNITY): Payer: Self-pay

## 2016-02-04 DIAGNOSIS — J45909 Unspecified asthma, uncomplicated: Secondary | ICD-10-CM | POA: Diagnosis present

## 2016-02-04 DIAGNOSIS — Z79899 Other long term (current) drug therapy: Secondary | ICD-10-CM

## 2016-02-04 DIAGNOSIS — I480 Paroxysmal atrial fibrillation: Secondary | ICD-10-CM | POA: Diagnosis present

## 2016-02-04 DIAGNOSIS — D649 Anemia, unspecified: Secondary | ICD-10-CM | POA: Diagnosis present

## 2016-02-04 DIAGNOSIS — I5031 Acute diastolic (congestive) heart failure: Secondary | ICD-10-CM | POA: Diagnosis not present

## 2016-02-04 DIAGNOSIS — I493 Ventricular premature depolarization: Secondary | ICD-10-CM | POA: Diagnosis present

## 2016-02-04 DIAGNOSIS — J9621 Acute and chronic respiratory failure with hypoxia: Secondary | ICD-10-CM | POA: Diagnosis present

## 2016-02-04 DIAGNOSIS — R778 Other specified abnormalities of plasma proteins: Secondary | ICD-10-CM

## 2016-02-04 DIAGNOSIS — R0602 Shortness of breath: Secondary | ICD-10-CM | POA: Diagnosis not present

## 2016-02-04 DIAGNOSIS — M7122 Synovial cyst of popliteal space [Baker], left knee: Secondary | ICD-10-CM | POA: Diagnosis present

## 2016-02-04 DIAGNOSIS — R7989 Other specified abnormal findings of blood chemistry: Secondary | ICD-10-CM

## 2016-02-04 DIAGNOSIS — N183 Chronic kidney disease, stage 3 (moderate): Secondary | ICD-10-CM

## 2016-02-04 DIAGNOSIS — Z9989 Dependence on other enabling machines and devices: Secondary | ICD-10-CM

## 2016-02-04 DIAGNOSIS — I160 Hypertensive urgency: Secondary | ICD-10-CM | POA: Diagnosis present

## 2016-02-04 DIAGNOSIS — M7989 Other specified soft tissue disorders: Secondary | ICD-10-CM | POA: Diagnosis not present

## 2016-02-04 DIAGNOSIS — Z86718 Personal history of other venous thrombosis and embolism: Secondary | ICD-10-CM | POA: Diagnosis not present

## 2016-02-04 DIAGNOSIS — Z7982 Long term (current) use of aspirin: Secondary | ICD-10-CM

## 2016-02-04 DIAGNOSIS — E1122 Type 2 diabetes mellitus with diabetic chronic kidney disease: Secondary | ICD-10-CM | POA: Diagnosis present

## 2016-02-04 DIAGNOSIS — E876 Hypokalemia: Secondary | ICD-10-CM | POA: Diagnosis present

## 2016-02-04 DIAGNOSIS — Z86711 Personal history of pulmonary embolism: Secondary | ICD-10-CM | POA: Diagnosis not present

## 2016-02-04 DIAGNOSIS — I5042 Chronic combined systolic (congestive) and diastolic (congestive) heart failure: Secondary | ICD-10-CM | POA: Diagnosis present

## 2016-02-04 DIAGNOSIS — N179 Acute kidney failure, unspecified: Secondary | ICD-10-CM | POA: Diagnosis present

## 2016-02-04 DIAGNOSIS — G4733 Obstructive sleep apnea (adult) (pediatric): Secondary | ICD-10-CM

## 2016-02-04 DIAGNOSIS — Z6841 Body Mass Index (BMI) 40.0 and over, adult: Secondary | ICD-10-CM

## 2016-02-04 DIAGNOSIS — M79609 Pain in unspecified limb: Secondary | ICD-10-CM | POA: Diagnosis not present

## 2016-02-04 DIAGNOSIS — I5043 Acute on chronic combined systolic (congestive) and diastolic (congestive) heart failure: Secondary | ICD-10-CM | POA: Diagnosis present

## 2016-02-04 DIAGNOSIS — I471 Supraventricular tachycardia, unspecified: Secondary | ICD-10-CM | POA: Diagnosis present

## 2016-02-04 DIAGNOSIS — Z87891 Personal history of nicotine dependence: Secondary | ICD-10-CM

## 2016-02-04 DIAGNOSIS — I5033 Acute on chronic diastolic (congestive) heart failure: Secondary | ICD-10-CM | POA: Diagnosis not present

## 2016-02-04 DIAGNOSIS — Z833 Family history of diabetes mellitus: Secondary | ICD-10-CM

## 2016-02-04 DIAGNOSIS — I130 Hypertensive heart and chronic kidney disease with heart failure and stage 1 through stage 4 chronic kidney disease, or unspecified chronic kidney disease: Principal | ICD-10-CM | POA: Diagnosis present

## 2016-02-04 DIAGNOSIS — N184 Chronic kidney disease, stage 4 (severe): Secondary | ICD-10-CM | POA: Diagnosis present

## 2016-02-04 DIAGNOSIS — M109 Gout, unspecified: Secondary | ICD-10-CM | POA: Diagnosis present

## 2016-02-04 DIAGNOSIS — I82409 Acute embolism and thrombosis of unspecified deep veins of unspecified lower extremity: Secondary | ICD-10-CM | POA: Diagnosis present

## 2016-02-04 DIAGNOSIS — Z7951 Long term (current) use of inhaled steroids: Secondary | ICD-10-CM

## 2016-02-04 DIAGNOSIS — R748 Abnormal levels of other serum enzymes: Secondary | ICD-10-CM | POA: Diagnosis present

## 2016-02-04 DIAGNOSIS — I509 Heart failure, unspecified: Secondary | ICD-10-CM | POA: Insufficient documentation

## 2016-02-04 DIAGNOSIS — Z9981 Dependence on supplemental oxygen: Secondary | ICD-10-CM

## 2016-02-04 DIAGNOSIS — I351 Nonrheumatic aortic (valve) insufficiency: Secondary | ICD-10-CM | POA: Diagnosis present

## 2016-02-04 DIAGNOSIS — Z7901 Long term (current) use of anticoagulants: Secondary | ICD-10-CM

## 2016-02-04 DIAGNOSIS — I5032 Chronic diastolic (congestive) heart failure: Secondary | ICD-10-CM

## 2016-02-04 DIAGNOSIS — Z8249 Family history of ischemic heart disease and other diseases of the circulatory system: Secondary | ICD-10-CM

## 2016-02-04 LAB — CBC WITH DIFFERENTIAL/PLATELET
BASOS ABS: 0 10*3/uL (ref 0.0–0.1)
Basophils Absolute: 0 10*3/uL (ref 0.0–0.1)
Basophils Relative: 0 %
Basophils Relative: 1 %
EOS ABS: 0 10*3/uL (ref 0.0–0.7)
EOS PCT: 1 %
EOS PCT: 0 %
Eosinophils Absolute: 0 10*3/uL (ref 0.0–0.7)
HCT: 36.1 % — ABNORMAL LOW (ref 39.0–52.0)
HEMATOCRIT: 37.3 % — AB (ref 39.0–52.0)
Hemoglobin: 11.9 g/dL — ABNORMAL LOW (ref 13.0–17.0)
Hemoglobin: 11.3 g/dL — ABNORMAL LOW (ref 13.0–17.0)
LYMPHS ABS: 1.4 10*3/uL (ref 0.7–4.0)
LYMPHS ABS: 1.5 10*3/uL (ref 0.7–4.0)
LYMPHS PCT: 26 %
Lymphocytes Relative: 27 %
MCH: 27.9 pg (ref 26.0–34.0)
MCH: 27.3 pg (ref 26.0–34.0)
MCHC: 31.9 g/dL (ref 30.0–36.0)
MCHC: 31.3 g/dL (ref 30.0–36.0)
MCV: 87.4 fL (ref 78.0–100.0)
MCV: 87.2 fL (ref 78.0–100.0)
MONO ABS: 0.3 10*3/uL (ref 0.1–1.0)
MONOS PCT: 6 %
Monocytes Absolute: 0.4 10*3/uL (ref 0.1–1.0)
Monocytes Relative: 8 %
NEUTROS ABS: 3.6 10*3/uL (ref 1.7–7.7)
Neutro Abs: 3.4 10*3/uL (ref 1.7–7.7)
Neutrophils Relative %: 67 %
Neutrophils Relative %: 64 %
PLATELETS: 159 10*3/uL (ref 150–400)
PLATELETS: 155 10*3/uL (ref 150–400)
RBC: 4.27 MIL/uL (ref 4.22–5.81)
RBC: 4.14 MIL/uL — AB (ref 4.22–5.81)
RDW: 14.2 % (ref 11.5–15.5)
RDW: 14.3 % (ref 11.5–15.5)
WBC: 5.4 10*3/uL (ref 4.0–10.5)
WBC: 5.3 10*3/uL (ref 4.0–10.5)

## 2016-02-04 LAB — URINALYSIS, ROUTINE W REFLEX MICROSCOPIC
Bilirubin Urine: NEGATIVE
GLUCOSE, UA: NEGATIVE mg/dL
HGB URINE DIPSTICK: NEGATIVE
Ketones, ur: NEGATIVE mg/dL
Leukocytes, UA: NEGATIVE
Nitrite: NEGATIVE
PROTEIN: NEGATIVE mg/dL
Specific Gravity, Urine: 1.011 (ref 1.005–1.030)
pH: 7.5 (ref 5.0–8.0)

## 2016-02-04 LAB — BRAIN NATRIURETIC PEPTIDE: B Natriuretic Peptide: 539.2 pg/mL — ABNORMAL HIGH (ref 0.0–100.0)

## 2016-02-04 LAB — COMPREHENSIVE METABOLIC PANEL
ALK PHOS: 55 U/L (ref 38–126)
ALT: 23 U/L (ref 17–63)
AST: 26 U/L (ref 15–41)
Albumin: 3.8 g/dL (ref 3.5–5.0)
Anion gap: 4 — ABNORMAL LOW (ref 5–15)
BILIRUBIN TOTAL: 0.8 mg/dL (ref 0.3–1.2)
BUN: 26 mg/dL — AB (ref 6–20)
CHLORIDE: 105 mmol/L (ref 101–111)
CO2: 32 mmol/L (ref 22–32)
CREATININE: 2.09 mg/dL — AB (ref 0.61–1.24)
Calcium: 8.5 mg/dL — ABNORMAL LOW (ref 8.9–10.3)
GFR calc Af Amer: 40 mL/min — ABNORMAL LOW (ref >60)
GFR, EST NON AFRICAN AMERICAN: 34 mL/min — AB (ref >60)
Glucose, Bld: 94 mg/dL (ref 65–99)
Potassium: 3.1 mmol/L — ABNORMAL LOW (ref 3.5–5.1)
Sodium: 141 mmol/L (ref 135–145)
Total Protein: 6.3 g/dL — ABNORMAL LOW (ref 6.5–8.1)

## 2016-02-04 LAB — BASIC METABOLIC PANEL
Anion gap: 9 (ref 5–15)
BUN: 26 mg/dL — AB (ref 6–20)
CALCIUM: 8.8 mg/dL — AB (ref 8.9–10.3)
CO2: 30 mmol/L (ref 22–32)
CREATININE: 2.17 mg/dL — AB (ref 0.61–1.24)
Chloride: 104 mmol/L (ref 101–111)
GFR calc Af Amer: 38 mL/min — ABNORMAL LOW (ref >60)
GFR, EST NON AFRICAN AMERICAN: 33 mL/min — AB (ref >60)
GLUCOSE: 111 mg/dL — AB (ref 65–99)
Potassium: 3.4 mmol/L — ABNORMAL LOW (ref 3.5–5.1)
Sodium: 143 mmol/L (ref 135–145)

## 2016-02-04 LAB — TROPONIN I
TROPONIN I: 0.07 ng/mL — AB (ref <0.03)
Troponin I: 0.07 ng/mL (ref <0.03)
Troponin I: 0.06 ng/mL (ref <0.03)
Troponin I: 0.06 ng/mL (ref <0.03)

## 2016-02-04 LAB — I-STAT TROPONIN, ED: Troponin i, poc: 0.08 ng/mL (ref 0.00–0.08)

## 2016-02-04 LAB — TSH: TSH: 1.536 u[IU]/mL (ref 0.350–4.500)

## 2016-02-04 MED ORDER — NITROGLYCERIN 2 % TD OINT
1.0000 [in_us] | TOPICAL_OINTMENT | Freq: Once | TRANSDERMAL | Status: AC
  Administered 2016-02-04: 1 [in_us] via TOPICAL
  Filled 2016-02-04: qty 1

## 2016-02-04 MED ORDER — MONTELUKAST SODIUM 10 MG PO TABS
10.0000 mg | ORAL_TABLET | Freq: Every day | ORAL | Status: DC
  Administered 2016-02-04 – 2016-02-06 (×3): 10 mg via ORAL
  Filled 2016-02-04 (×3): qty 1

## 2016-02-04 MED ORDER — ASPIRIN EC 81 MG PO TBEC
81.0000 mg | DELAYED_RELEASE_TABLET | Freq: Every day | ORAL | Status: DC
  Administered 2016-02-04 – 2016-02-06 (×3): 81 mg via ORAL
  Filled 2016-02-04 (×3): qty 1

## 2016-02-04 MED ORDER — COLCHICINE 0.6 MG PO TABS: 0.6000 mg | ORAL_TABLET | Freq: Every day | ORAL | Status: DC | PRN

## 2016-02-04 MED ORDER — CLONIDINE HCL 0.1 MG PO TABS
0.1000 mg | ORAL_TABLET | Freq: Three times a day (TID) | ORAL | Status: DC
  Administered 2016-02-04 – 2016-02-07 (×10): 0.1 mg via ORAL
  Filled 2016-02-04 (×10): qty 1

## 2016-02-04 MED ORDER — HYDRALAZINE HCL 50 MG PO TABS
50.0000 mg | ORAL_TABLET | Freq: Two times a day (BID) | ORAL | Status: DC
  Administered 2016-02-04 – 2016-02-07 (×7): 50 mg via ORAL
  Filled 2016-02-04 (×7): qty 1

## 2016-02-04 MED ORDER — FUROSEMIDE 10 MG/ML IJ SOLN
80.0000 mg | Freq: Once | INTRAMUSCULAR | Status: AC
  Administered 2016-02-04: 80 mg via INTRAVENOUS
  Filled 2016-02-04: qty 8

## 2016-02-04 MED ORDER — RIVAROXABAN 20 MG PO TABS
20.0000 mg | ORAL_TABLET | Freq: Every day | ORAL | Status: DC
  Administered 2016-02-04 – 2016-02-06 (×3): 20 mg via ORAL
  Filled 2016-02-04 (×3): qty 1

## 2016-02-04 MED ORDER — ONDANSETRON HCL 4 MG PO TABS: 4.0000 mg | ORAL_TABLET | Freq: Four times a day (QID) | ORAL | Status: DC | PRN

## 2016-02-04 MED ORDER — HYDRALAZINE HCL 20 MG/ML IJ SOLN: 10.0000 mg | INTRAMUSCULAR | Status: DC | PRN

## 2016-02-04 MED ORDER — ALBUTEROL SULFATE HFA 108 (90 BASE) MCG/ACT IN AERS: 2.0000 | INHALATION_SPRAY | Freq: Four times a day (QID) | RESPIRATORY_TRACT | Status: DC

## 2016-02-04 MED ORDER — ONDANSETRON HCL 4 MG/2ML IJ SOLN: 4.0000 mg | Freq: Four times a day (QID) | INTRAMUSCULAR | Status: DC | PRN

## 2016-02-04 MED ORDER — MOMETASONE FURO-FORMOTEROL FUM 200-5 MCG/ACT IN AERO
2.0000 | INHALATION_SPRAY | Freq: Two times a day (BID) | RESPIRATORY_TRACT | Status: DC
  Administered 2016-02-04 – 2016-02-07 (×7): 2 via RESPIRATORY_TRACT
  Filled 2016-02-04: qty 8.8

## 2016-02-04 MED ORDER — DILTIAZEM HCL 30 MG PO TABS
30.0000 mg | ORAL_TABLET | Freq: Four times a day (QID) | ORAL | Status: DC
  Administered 2016-02-04 – 2016-02-05 (×3): 30 mg via ORAL
  Filled 2016-02-04 (×3): qty 1

## 2016-02-04 MED ORDER — ORAL CARE MOUTH RINSE
15.0000 mL | Freq: Two times a day (BID) | OROMUCOSAL | Status: DC
  Administered 2016-02-04 – 2016-02-07 (×3): 15 mL via OROMUCOSAL

## 2016-02-04 MED ORDER — ALBUTEROL SULFATE (2.5 MG/3ML) 0.083% IN NEBU: 2.5000 mg | INHALATION_SOLUTION | RESPIRATORY_TRACT | Status: DC | PRN

## 2016-02-04 MED ORDER — HYDRALAZINE HCL 20 MG/ML IJ SOLN
10.0000 mg | Freq: Once | INTRAMUSCULAR | Status: AC
  Administered 2016-02-04: 10 mg via INTRAVENOUS
  Filled 2016-02-04: qty 1

## 2016-02-04 MED ORDER — SODIUM CHLORIDE 0.9 % IV SOLN
30.0000 meq | Freq: Once | INTRAVENOUS | Status: AC
  Administered 2016-02-04: 30 meq via INTRAVENOUS
  Filled 2016-02-04: qty 15

## 2016-02-04 MED ORDER — FUROSEMIDE 10 MG/ML IJ SOLN
80.0000 mg | Freq: Two times a day (BID) | INTRAMUSCULAR | Status: DC
  Administered 2016-02-04 – 2016-02-05 (×3): 80 mg via INTRAVENOUS
  Filled 2016-02-04 (×3): qty 8

## 2016-02-04 MED ORDER — LOSARTAN POTASSIUM 50 MG PO TABS
25.0000 mg | ORAL_TABLET | Freq: Every day | ORAL | Status: DC
  Administered 2016-02-04 – 2016-02-07 (×4): 25 mg via ORAL
  Filled 2016-02-04 (×4): qty 1

## 2016-02-04 MED ORDER — ASPIRIN 81 MG PO CHEW
324.0000 mg | CHEWABLE_TABLET | Freq: Once | ORAL | Status: AC
  Administered 2016-02-04: 324 mg via ORAL
  Filled 2016-02-04: qty 4

## 2016-02-04 MED ORDER — ACETAMINOPHEN 325 MG PO TABS: 650.0000 mg | ORAL_TABLET | Freq: Four times a day (QID) | ORAL | Status: DC | PRN

## 2016-02-04 MED ORDER — CLONIDINE HCL 0.1 MG PO TABS
0.1000 mg | ORAL_TABLET | Freq: Once | ORAL | Status: AC
  Administered 2016-02-04: 0.1 mg via ORAL
  Filled 2016-02-04: qty 1

## 2016-02-04 MED ORDER — ACETAMINOPHEN 650 MG RE SUPP: 650.0000 mg | Freq: Four times a day (QID) | RECTAL | Status: DC | PRN

## 2016-02-04 NOTE — ED Notes (Signed)
Notified Dr Dina Rich and [facility] RN of troponin 0.07.

## 2016-02-04 NOTE — Progress Notes (Signed)
PROGRESS NOTE    Phillip [name]  ASN:[id number] DOB: 12/21/1960 DOA: 02/04/2016 PCP: [name], MD   Brief Narrative: 55 y.o. male with history of chronic combined diastolic and systolic heart failure, hypertension, morbid obesity, OSA, paroxysmal atrial fibrillation, gout presents to the ER because of acute worsening of shortness of breath and leg swelling.  Assessment & Plan:   # Acute on chronic diastolic CHF (congestive heart failure) ([place]): Echo from 2017 and March reviewed consistent with grade 1 diastolic dysfunction and normal systolic function. Patient now presented with shortness of breath, lower extremity edema and pulmonary vascular congestion consistent with CHF. -Continue Lasix IV, history ins and outs, daily weight -Repeat echocardiogram -Monitor clinical response  #Mild elevation in troponin level: On reviewing patient's chart, patient has chronic elevation in troponin level. Patient does not have chest pain. We will get echocardiogram. I will consult cardiologist.  #SVT: Currently are treated improved. Patient has hypokalemia which is repleted. Magnesium level acceptable. Continue to monitor in telemeter.  # OSA on CPAP: Patient is morbidly obese. Continue CPAP in the hospital. Patient has chronic hypoxic respiratory failure and uses oxygen at home intermittently. -Continue bronchodilators.  #  Acute renal failure superimposed on stage 3 chronic kidney disease ([place]): Likely hemodynamically mediated. I will check UA. Denied any urinary symptoms. Continue to monitor serum creatinine level and electrolyte. Patient is on IV Lasix.  # Paroxysmal atrial fibrillation [facility]): Monitor heart rate. Currently on systemic anticoagulation with xarelto.  #Hypertensive urgency: Continue clonidine, hydralazine, losartan and IV Lasix. Monitor blood pressure. There is hydralazine IV when necessary orders for uncontrolled blood pressure.  DVT prophylaxis: Systemic anticoagulation. Code  Status: Full code Family Communication: No family present at bedside Disposition Plan: Likely discharge home in 1-2 days.  Consultants:   Calling cardiology consult today.  Procedures: None Antimicrobials: None  Subjective: Patient was seen and examined at bedside. Patient reported that his shortness of breath is better but is still there. Denied chest pain, fever, chills, nausea, vomiting,    Objective: Vitals:   02/04/16 0337 02/04/16 0400 02/04/16 0456 02/04/16 0924  BP: 155/92 172/98 (!) 167/96   Pulse:  77 80   Resp:   18   Temp:   98.4 F (36.9 C)   TempSrc:   Oral   SpO2:  93% 97% 98%  Weight:   (!) 178.4 kg (393 lb 4.8 oz)   Height:   5\' 8"  (1.727 m)     Intake/Output Summary (Last 24 hours) at 02/04/16 1148 Last data filed at 02/04/16 0900  Gross per 24 hour  Intake              240 ml  Output             1425 ml  Net            -1185 ml   Filed Weights   02/04/16 0148 02/04/16 0456  Weight: (!) 176.9 kg (390 lb) (!) 178.4 kg (393 lb 4.8 oz)    Examination:  General exam: Appears calm and comfortable . Morbidly obese male. Respiratory system: Clear to auscultation. Respiratory effort normal. No wheezing or crackle Cardiovascular system: S1 & S2 heard, RRR. Trace bilateral lower extremity edema. Gastrointestinal system: Abdomen is nondistended, soft and nontender. Normal bowel sounds heard. Central nervous system: Alert and oriented. No focal neurological deficits. Extremities: Symmetric 5 x 5 power. Skin: No rashes, lesions or ulcers Psychiatry: Judgement and insight appear normal. Mood & affect appropriate.  Data Reviewed: I have personally reviewed following labs and imaging studies  CBC:  Recent Labs Lab 02/04/16 0219 02/04/16 0539  WBC 5.4 5.3  NEUTROABS 3.6 3.4  HGB 11.9* 11.3*  HCT 37.3* 36.1*  MCV 87.4 87.2  PLT 159 979   Basic Metabolic Panel:  Recent Labs Lab 02/04/16 0219 02/04/16 0539  NA 143 141  K 3.4* 3.1*  CL 104  105  CO2 30 32  GLUCOSE 111* 94  BUN 26* 26*  CREATININE 2.17* 2.09*  CALCIUM 8.8* 8.5*   GFR: Estimated Creatinine Clearance: 65 mL/min (by C-G formula based on SCr of 2.09 mg/dL (H)). Liver Function Tests:  Recent Labs Lab 02/04/16 0539  AST 26  ALT 23  ALKPHOS 55  BILITOT 0.8  PROT 6.3*  ALBUMIN 3.8   No results for input(s): LIPASE, AMYLASE in the last 168 hours. No results for input(s): AMMONIA in the last 168 hours. Coagulation Profile: No results for input(s): INR, PROTIME in the last 168 hours. Cardiac Enzymes:  Recent Labs Lab 02/04/16 0219 02/04/16 0539  TROPONINI 0.07* 0.06*   BNP (last 3 results) No results for input(s): PROBNP in the last 8760 hours. HbA1C: No results for input(s): HGBA1C in the last 72 hours. CBG: No results for input(s): GLUCAP in the last 168 hours. Lipid Profile: No results for input(s): CHOL, HDL, LDLCALC, TRIG, CHOLHDL, LDLDIRECT in the last 72 hours. Thyroid Function Tests:  Recent Labs  02/04/16 0539  TSH 1.536   Anemia Panel: No results for input(s): VITAMINB12, FOLATE, FERRITIN, TIBC, IRON, RETICCTPCT in the last 72 hours. Sepsis Labs: No results for input(s): PROCALCITON, LATICACIDVEN in the last 168 hours.  No results found for this or any previous visit (from the past 240 hour(s)).       Radiology Studies: Dg Chest 2 View  Result Date: 02/04/2016 CLINICAL DATA:  Acute onset of shortness of breath. Nonproductive cough and lower extremity swelling. Initial encounter. EXAM: CHEST  2 VIEW COMPARISON:  Chest radiograph performed 01/21/2016 FINDINGS: The lungs are well-aerated. Mild vascular congestion is noted. There is no evidence of focal opacification, pleural effusion or pneumothorax. The heart is mildly enlarged. No acute osseous abnormalities are seen. IMPRESSION: Mild vascular congestion and mild cardiomegaly. Lungs remain grossly clear. Electronically Signed   By: Garald Balding M.D.   On: 02/04/2016 03:11         Scheduled Meds: . aspirin EC  81 mg Oral QHS  . cloNIDine  0.1 mg Oral TID  . furosemide  80 mg Intravenous Q12H  . hydrALAZINE  50 mg Oral BID  . losartan  25 mg Oral Daily  . mouth rinse  15 mL Mouth Rinse BID  . mometasone-formoterol  2 puff Inhalation BID  . montelukast  10 mg Oral QHS  . potassium chloride (KCL MULTIRUN) 30 mEq in 265 mL IVPB  30 mEq Intravenous Once  . rivaroxaban  20 mg Oral Q supper   Continuous Infusions:   LOS: 0 days    Time spent: 32 minutes    Sheriden Archibeque Tanna Furry, MD [facility] Pager [phone]  If 7PM-7AM, please contact night-coverage [email or web address] Password [email or web address] 02/04/2016, 11:48 AM

## 2016-02-04 NOTE — H&P (Signed)
History and Physical    Phillip [name] ZOX:[id number] DOB: [birth year] DOA: 02/04/2016  PCP: [name], MD  Patient coming from: Home.  Chief Complaint: Shortness of breath.  HPI: Phillip [name] is a 55 y.o. male with history of chronic combined diastolic and systolic heart failure, hypertension, morbid obesity, OSA, paroxysmal atrial fibrillation, gout presents to the ER because of acute worsening of shortness of breath last night. Patient states he was cleaning his clothes when he slowly started developing short of breath which worsened after he took a shower. Denies any productive cough fever chills. In the ER patient's blood pressure was markedly elevated. Chest x-ray shows congestion with labs showing mildly elevated troponin and elevated BNP. Patient was given Lasix 80 mg IV and nitroglycerin patch and admitted for acute decompensated CHF and hypertensive urgency. Patient states he has been compliant with his medications.   ED Course: Lasix 80 mg IV followed by a nitroglycerin patch was given.  Review of Systems: As per HPI, rest all negative.   Past Medical History:  Diagnosis Date  . CHF (congestive heart failure) ([place]) 11/15   EF 40-45%, improved with follow-up  . Diabetes mellitus without complication ([place])   . Gout   . Hypertension   . Hypertensive cardiovascular disease   . Morbid obesity ([place])   . Obstructive sleep apnea    complaint with CPAP  . OSA on CPAP   . Paroxysmal atrial fibrillation ([facility])   . PNA (pneumonia)   . Pulmonary embolism [facility]     Past Surgical History:  Procedure Laterality Date  . May     reports that he quit smoking about 26 years ago. His smoking use included Cigarettes. He has a 0.25 pack-year smoking history. He has never used smokeless tobacco. He reports that he does not drink alcohol or use drugs.  No Known Allergies  Family History  Problem Relation Age of Onset  . Diabetes Father   . CAD Father   .  Hypertension Other   . Diabetes Other   . Allergies Brother     Prior to Admission medications   Medication Sig Start Date End Date Taking? Authorizing Provider  albuterol (PROAIR HFA) 108 (90 Base) MCG/ACT inhaler 2 puffs every 4 hours as needed only  if your can't catch your breath 12/27/15  Yes Tanda Rockers, MD  albuterol (PROVENTIL) (2.5 MG/3ML) 0.083% nebulizer solution Take 3 mLs (2.5 mg total) by nebulization every 4 (four) hours as needed for wheezing or shortness of breath. 07/08/15  Yes Kelvin Cellar, MD  aspirin 81 MG tablet Take 81 mg by mouth at bedtime.   Yes Historical Provider, MD  budesonide-formoterol (SYMBICORT) 160-4.5 MCG/ACT inhaler Inhale 2 puffs into the lungs 2 (two) times daily. 01/21/16  Yes Tanda Rockers, MD  cloNIDine (CATAPRES) 0.1 MG tablet Take 1 tablet (0.1 mg total) by mouth 3 (three) times daily. 12/16/15  Yes Janece Canterbury, MD  colchicine 0.6 MG tablet Take 0.6 mg by mouth daily as needed (gout flare up.). For gout   Yes Historical Provider, MD  hydrALAZINE (APRESOLINE) 50 MG tablet Take 50 mg by mouth 2 (two) times daily.   Yes Historical Provider, MD  ibuprofen (ADVIL,MOTRIN) 200 MG tablet Take 400 mg by mouth every 6 (six) hours as needed for moderate pain.    Yes Historical Provider, MD  losartan (COZAAR) 25 MG tablet Take 1 tablet (25 mg total) by mouth daily. 09/07/15  Yes Thompson Grayer,  MD  montelukast (SINGULAIR) 10 MG tablet Take 10 mg by mouth at bedtime.   Yes Historical Provider, MD  OXYGEN Inhale 2 L into the lungs daily. 2lpm when needed per pt    Yes Historical Provider, MD  rivaroxaban (XARELTO) 20 MG TABS tablet Take 1 tablet (20 mg total) by mouth daily with supper. 09/07/15  Yes Thompson Grayer, MD  torsemide (DEMADEX) 20 MG tablet Take 40 mg by mouth daily.   Yes Historical Provider, MD  UNABLE TO FIND Med Name: CPAP with sleep   Yes Historical Provider, MD  predniSONE (DELTASONE) 20 MG tablet Take 3 tabs daily for 2 day, 2 tabs daily x 2  days, then 1 tab daily x 2 days,  Then half tab daily thereafter Patient not taking: Reported on 02/04/2016 12/16/15   Janece Canterbury, MD    Physical Exam: Vitals:   02/04/16 0330 02/04/16 0337 02/04/16 0400 02/04/16 0456  BP: 155/92 155/92 172/98 (!) 167/96  Pulse: 71  77 80  Resp: 12   18  Temp:    98.4 F (36.9 C)  TempSrc:    Oral  SpO2: 98%  93% 97%  Weight:    (!) 178.4 kg (393 lb 4.8 oz)  Height:    5\' 8"  (1.727 m)      Constitutional: Obese not in distress. Vitals:   02/04/16 0330 02/04/16 0337 02/04/16 0400 02/04/16 0456  BP: 155/92 155/92 172/98 (!) 167/96  Pulse: 71  77 80  Resp: 12   18  Temp:    98.4 F (36.9 C)  TempSrc:    Oral  SpO2: 98%  93% 97%  Weight:    (!) 178.4 kg (393 lb 4.8 oz)  Height:    5\' 8"  (1.727 m)   Eyes: Anicteric no pallor. ENMT: No discharge from the ears eyes nose and mouth. Neck: No mass felt. JVD mildly elevated. Respiratory: No rhonchi or crepitations. Cardiovascular: S1 and S2 heard no murmurs appreciated. Abdomen: Soft nontender bowel sounds present. No guarding or rigidity. Musculoskeletal: Mild lower extremity edema, bilaterally. Skin: No rash. Skin appears warm. Neurologic: Alert awake oriented to time place and person. Moves all extremities. Psychiatric: Appears normal. Normal affect.   Labs on Admission: I have personally reviewed following labs and imaging studies  CBC:  Recent Labs Lab 02/04/16 0219  WBC 5.4  NEUTROABS 3.6  HGB 11.9*  HCT 37.3*  MCV 87.4  PLT 342   Basic Metabolic Panel:  Recent Labs Lab 02/04/16 0219  NA 143  K 3.4*  CL 104  CO2 30  GLUCOSE 111*  BUN 26*  CREATININE 2.17*  CALCIUM 8.8*   GFR: Estimated Creatinine Clearance: 62.6 mL/min (by C-G formula based on SCr of 2.17 mg/dL (H)). Liver Function Tests: No results for input(s): AST, ALT, ALKPHOS, BILITOT, PROT, ALBUMIN in the last 168 hours. No results for input(s): LIPASE, AMYLASE in the last 168 hours. No results for  input(s): AMMONIA in the last 168 hours. Coagulation Profile: No results for input(s): INR, PROTIME in the last 168 hours. Cardiac Enzymes:  Recent Labs Lab 02/04/16 0219  TROPONINI 0.07*   BNP (last 3 results) No results for input(s): PROBNP in the last 8760 hours. HbA1C: No results for input(s): HGBA1C in the last 72 hours. CBG: No results for input(s): GLUCAP in the last 168 hours. Lipid Profile: No results for input(s): CHOL, HDL, LDLCALC, TRIG, CHOLHDL, LDLDIRECT in the last 72 hours. Thyroid Function Tests: No results for input(s): TSH, T4TOTAL, FREET4, T3FREE,  THYROIDAB in the last 72 hours. Anemia Panel: No results for input(s): VITAMINB12, FOLATE, FERRITIN, TIBC, IRON, RETICCTPCT in the last 72 hours. Urine analysis:    Component Value Date/Time   COLORURINE YELLOW 09/08/2015 0050   APPEARANCEUR CLEAR 09/08/2015 0050   LABSPEC 1.014 09/08/2015 0050   PHURINE 5.5 09/08/2015 0050   GLUCOSEU NEGATIVE 09/08/2015 0050   HGBUR NEGATIVE 09/08/2015 0050   BILIRUBINUR NEGATIVE 09/08/2015 0050   KETONESUR NEGATIVE 09/08/2015 0050   PROTEINUR NEGATIVE 09/08/2015 0050   UROBILINOGEN 1.0 12/04/2014 0546   NITRITE NEGATIVE 09/08/2015 0050   LEUKOCYTESUR NEGATIVE 09/08/2015 0050   Sepsis Labs: @LABRCNTIP (procalcitonin:4,lacticidven:4) )No results found for this or any previous visit (from the past 240 hour(s)).   Radiological Exams on Admission: Dg Chest 2 View  Result Date: 02/04/2016 CLINICAL DATA:  Acute onset of shortness of breath. Nonproductive cough and lower extremity swelling. Initial encounter. EXAM: CHEST  2 VIEW COMPARISON:  Chest radiograph performed 01/21/2016 FINDINGS: The lungs are well-aerated. Mild vascular congestion is noted. There is no evidence of focal opacification, pleural effusion or pneumothorax. The heart is mildly enlarged. No acute osseous abnormalities are seen. IMPRESSION: Mild vascular congestion and mild cardiomegaly. Lungs remain grossly  clear. Electronically Signed   By: Garald Balding M.D.   On: 02/04/2016 03:11    EKG: Independently reviewed. Normal sinus rhythm.  Assessment/Plan Principal Problem:   Acute diastolic CHF (congestive heart failure) ([facility]) Active Problems:   OSA on CPAP   Acute renal failure superimposed on stage 3 chronic kidney disease ([facility])   Paroxysmal atrial fibrillation ([facility])   DVT (deep venous thrombosis) ([facility])   CHF (congestive heart failure) ([place])    1. Acute decompensated combined diastolic and systolic CHF last EF measured in March 2017 was 55-60% with grade 1 diastolic dysfunction - patient has been placed on Lasix 80 mg every 12. Closely follow intake output metabolic panel and daily weights. On Cozaar. Which may need to be held if creatinine is worsening. 2. Hypertensive urgency probably contributing to #1 - patient is on clonidine and hydralazine and Cozaar. In addition I have placed patient on when necessary IV hydralazine. If blood pressure does not improve then may have increase patient's hydralazine dose and frequency. 3. Elevated troponin probably secondary to #1 - cycle cardiac markers. 4. Acute on chronic renal failure stage 3-4 - if creatinine worsens may have to hold Cozaar. Closely follow intake output and metabolic panel. 5. History of asthma - continue inhalers. 6. Paroxysmal atrial fibrillation - chads 2 vasc score is 2. Patient is on xarelto. 7. History of gout on colchicine. 8. History of DVT on xarelto. 9. Anemia - follow CBC.   DVT prophylaxis: Xarelto. Code Status: Full code.  Family Communication: Discussed with patient.  Disposition Plan: Home.  Consults called: None.  Admission status: Inpatient. Likely stay 2 days.    Rise Patience MD [facility] Pager [phone].  If 7PM-7AM, please contact night-coverage [email or web address] Password [email or web address]  02/04/2016, 5:30 AM

## 2016-02-04 NOTE — ED Notes (Signed)
PT transported to XRay 

## 2016-02-04 NOTE — ED Provider Notes (Signed)
[facility] DEPT Provider Note   CSN: [id number] Arrival date & time: 02/04/16  0140    By signing my name below, I, Macon Large, attest that this documentation has been prepared under the direction and in the presence of Merryl Hacker, MD. Electronically Signed: Macon Large, ED Scribe. 02/04/16. 2:34 AM.  History   Chief Complaint Chief Complaint  Patient presents with  . Shortness of Breath   The history is provided by the patient. No language interpreter was used.    HPI Comments: Phillip [name] is a 55 y.o. male PMHx of CHF and A-fib, who presents to the Emergency Department complaining of moderate, constant, SOB onset four days ago. Pt reports his SOB has been gradually worsening onset ~11pm yesterday evening. Pt notes his SOB increased due to the change of weather last week. He states he was doing laundry when he began to sweat. He then got into the shower he got more short of breath. Pt states he grabbed his oxygen tank and went to bed. He notes having a sensation of something sitting on his chest which made it harder for him to breathe. Rates the pressure 5 out of 10. Per Pt, he does note a h/o pneumonia, and describes his SOB feels similar to those symptoms. He also notes having a similar episode with his CHF. He does report taking albuterol at home with little to no relief. Pt also reports an increase of leg swelling compared to his normal baseline.  he takes torsemide daily. Pt denies nausea,vomiting, fever. Pt states he is a non-smoker. No additional complaints at this time.   Past Medical History:  Diagnosis Date  . CHF (congestive heart failure) ([place]) 11/15   EF 40-45%, improved with follow-up  . Diabetes mellitus without complication ([place])   . Gout   . Hypertension   . Hypertensive cardiovascular disease   . Morbid obesity ([place])   . Obstructive sleep apnea    complaint with CPAP  . OSA on CPAP   . Paroxysmal atrial fibrillation (HCC)   . PNA (pneumonia)    . Pulmonary embolism [facility])     Patient Active Problem List   Diagnosis Date Noted  . Atrial fibrillation ([place]) [I48.91] 08/17/2015  . Long term (current) use of anticoagulants [Z79.01] 08/17/2015  . Hx pulmonary embolism 08/17/2015  . DVT (deep venous thrombosis) ([place]) 08/17/2015  . Deep vein thrombosis (DVT) of lower extremity ([place]) 08/17/2015  . Paroxysmal atrial fibrillation ([place]) 08/04/2015  . HCAP (healthcare-associated pneumonia) 07/04/2015  . SVT (supraventricular tachycardia) ([place]) 06/22/2015  . Acute on chronic respiratory failure with hypoxia ([place]) 06/21/2015  . Morbid (severe) obesity due to excess calories ([place]) 03/07/2015  . Chronic combined systolic and diastolic CHF (congestive heart failure) ([place]) 03/07/2015  . Acute respiratory failure ([place]) 02/20/2015  . Essential hypertension   . Gout   . CAP (community acquired pneumonia) 05/07/2014  . Mild persistent asthma in adult without complication 27/08/2374  . OSA on CPAP 05/06/2014  . Hypoxia 05/06/2014  . Acute renal failure superimposed on stage 3 chronic kidney disease ([place]) 05/06/2014  . Asthma with acute exacerbation 05/06/2014    Past Surgical History:  Procedure Laterality Date  . UMBILICAL HERNIA REPAIR  1992       Home Medications    Prior to Admission medications   Medication Sig Start Date End Date Taking? Authorizing Provider  albuterol (PROAIR HFA) 108 (90 Base) MCG/ACT inhaler 2 puffs every 4 hours as needed only  if your can't catch  your breath 12/27/15  Yes Tanda Rockers, MD  albuterol (PROVENTIL) (2.5 MG/3ML) 0.083% nebulizer solution Take 3 mLs (2.5 mg total) by nebulization every 4 (four) hours as needed for wheezing or shortness of breath. 07/08/15  Yes Kelvin Cellar, MD  aspirin 81 MG tablet Take 81 mg by mouth at bedtime.   Yes Historical Provider, MD  budesonide-formoterol (SYMBICORT) 160-4.5 MCG/ACT inhaler Inhale 2 puffs into the lungs 2 (two) times daily. 01/21/16  Yes Tanda Rockers,  MD  cloNIDine (CATAPRES) 0.1 MG tablet Take 1 tablet (0.1 mg total) by mouth 3 (three) times daily. 12/16/15  Yes Janece Canterbury, MD  colchicine 0.6 MG tablet Take 0.6 mg by mouth daily as needed (gout flare up.). For gout   Yes Historical Provider, MD  hydrALAZINE (APRESOLINE) 50 MG tablet Take 50 mg by mouth 2 (two) times daily.   Yes Historical Provider, MD  ibuprofen (ADVIL,MOTRIN) 200 MG tablet Take 400 mg by mouth every 6 (six) hours as needed for moderate pain.    Yes Historical Provider, MD  losartan (COZAAR) 25 MG tablet Take 1 tablet (25 mg total) by mouth daily. 09/07/15  Yes Thompson Grayer, MD  montelukast (SINGULAIR) 10 MG tablet Take 10 mg by mouth at bedtime.   Yes Historical Provider, MD  OXYGEN Inhale 2 L into the lungs daily. 2lpm when needed per pt    Yes Historical Provider, MD  rivaroxaban (XARELTO) 20 MG TABS tablet Take 1 tablet (20 mg total) by mouth daily with supper. 09/07/15  Yes Thompson Grayer, MD  torsemide (DEMADEX) 20 MG tablet Take 40 mg by mouth daily.   Yes Historical Provider, MD  UNABLE TO FIND Med Name: CPAP with sleep   Yes Historical Provider, MD  predniSONE (DELTASONE) 20 MG tablet Take 3 tabs daily for 2 day, 2 tabs daily x 2 days, then 1 tab daily x 2 days,  Then half tab daily thereafter Patient not taking: Reported on 02/04/2016 12/16/15   Janece Canterbury, MD    Family History Family History  Problem Relation Age of Onset  . Diabetes Father   . CAD Father   . Hypertension Other   . Diabetes Other   . Allergies Brother     Social History Social History  Substance Use Topics  . Smoking status: Former Smoker    Packs/day: 0.25    Years: 1.00    Types: Cigarettes    Quit date: 07/23/1989  . Smokeless tobacco: Never Used  . Alcohol use No     Allergies   Patient has no known allergies.   Review of Systems Review of Systems  Constitutional: Negative for fever.  Respiratory: Positive for shortness of breath.   Cardiovascular: Positive for  chest pain and leg swelling.  Gastrointestinal: Negative for nausea and vomiting.  All other systems reviewed and are negative.    Physical Exam Updated Vital Signs BP (!) 151/102   Pulse 77   Temp 98.7 F (37.1 C) (Oral)   Resp 19   Ht 5\' 8"  (1.727 m)   Wt (!) 390 lb (176.9 kg)   SpO2 97%   BMI 59.30 kg/m   Physical Exam  Constitutional: He is oriented to person, place, and time. He appears well-developed and well-nourished.  Morbidly obese  HENT:  Head: Normocephalic and atraumatic.  Cardiovascular: Normal rate, regular rhythm and normal heart sounds.   No murmur heard. Pulmonary/Chest: Effort normal and breath sounds normal. No respiratory distress.  Occasional expiratory squeak left lower lobe  Abdominal: Soft. Bowel sounds are normal. There is no tenderness. There is no rebound.  Musculoskeletal: He exhibits edema.  1+ bilateral lower extremity edema  Neurological: He is alert and oriented to person, place, and time.  Skin: Skin is warm and dry.  Psychiatric: He has a normal mood and affect.  Nursing note and vitals reviewed.    ED Treatments / Results   DIAGNOSTIC STUDIES: Oxygen Saturation is 98% on RA, normal by my interpretation.    COORDINATION OF CARE: 3:27 AM Discussed treatment plan with pt at bedside which includes labs, Chest XR, EKG and pt agreed to plan.   Labs (all labs ordered are listed, but only abnormal results are displayed) Labs Reviewed  CBC WITH DIFFERENTIAL/PLATELET - Abnormal; Notable for the following:       Result Value   Hemoglobin 11.9 (*)    HCT 37.3 (*)    All other components within normal limits  BASIC METABOLIC PANEL - Abnormal; Notable for the following:    Potassium 3.4 (*)    Glucose, Bld 111 (*)    BUN 26 (*)    Creatinine, Ser 2.17 (*)    Calcium 8.8 (*)    GFR calc non Af Amer 33 (*)    GFR calc Af Amer 38 (*)    All other components within normal limits  TROPONIN I - Abnormal; Notable for the following:     Troponin I 0.07 (*)    All other components within normal limits  BRAIN NATRIURETIC PEPTIDE  I-STAT TROPOININ, ED    EKG  EKG Interpretation  Date/Time:  Monday February 04 2016 02:01:44 EST Ventricular Rate:  85 PR Interval:    QRS Duration: 88 QT Interval:  410 QTC Calculation: 488 R Axis:   -26 Text Interpretation:  Sinus rhythm Ventricular premature complex Probable left atrial enlargement Borderline left axis deviation Borderline prolonged QT interval Confirmed by Dina Rich  MD, Cythnia Osmun ([id number]) on 02/04/2016 2:16:25 AM       Radiology Dg Chest 2 View  Result Date: 02/04/2016 CLINICAL DATA:  Acute onset of shortness of breath. Nonproductive cough and lower extremity swelling. Initial encounter. EXAM: CHEST  2 VIEW COMPARISON:  Chest radiograph performed 01/21/2016 FINDINGS: The lungs are well-aerated. Mild vascular congestion is noted. There is no evidence of focal opacification, pleural effusion or pneumothorax. The heart is mildly enlarged. No acute osseous abnormalities are seen. IMPRESSION: Mild vascular congestion and mild cardiomegaly. Lungs remain grossly clear. Electronically Signed   By: Garald Balding M.D.   On: 02/04/2016 03:11    Procedures Procedures (including critical care time)  Medications Ordered in ED Medications  furosemide (LASIX) injection 80 mg (not administered)  cloNIDine (CATAPRES) tablet 0.1 mg (not administered)  hydrALAZINE (APRESOLINE) injection 10 mg (not administered)  nitroGLYCERIN (NITROGLYN) 2 % ointment 1 inch (1 inch Topical Given 02/04/16 0232)  aspirin chewable tablet 324 mg (324 mg Oral Given 02/04/16 0250)     Initial Impression / Assessment and Plan / ED Course  I have reviewed the triage vital signs and the nursing notes.  Pertinent labs & imaging results that were available during my care of the patient were reviewed by me and considered in my medical decision making (see chart for details).  Clinical Course     Patient  presents with one-day history of shortness of breath. Worsening over this evening acutely. Also reports some chest pressure.  Reports similar episodes with CHF exacerbation and pneumonia. He is nontoxic.  Patient notably hypertensive. Respiratory exam  is fairly benign without significant wheezing only expiratory squeaks.  Chest x-ray shows mild vascular congestion. Lab work notable for mild worsening of his renal function. Troponin 0.07. Baseline 0.05. This could be related to worsening renal function; however, ACS is a consideration. No cardiac cath or stress testing noted in his chart. On recheck, he reports some improvement with nitroglycerin paste. He was given a full dose aspirin. Continues to endorse shortness of breath. Feel his clinical picture is most likely CHF, early versus hypertensive urgency versus ACS. Patient was given his clonidine and IV hydralazine. He was also given 1 dose of 80 mg of Lasix. He will need close monitoring given his renal function. He will also need cycling of his enzymes and potentially cardiology evaluation if troponins trend upward.  Final Clinical Impressions(s) / ED Diagnoses   Final diagnoses:  Acute on chronic combined systolic and diastolic congestive heart failure (HCC)  SOB (shortness of breath)  Elevated troponin    New Prescriptions New Prescriptions   No medications on file   I personally performed the services described in this documentation, which was scribed in my presence. The recorded information has been reviewed and is accurate.     Merryl Hacker, MD 02/04/16 [phone]

## 2016-02-04 NOTE — ED Notes (Signed)
Pt refused wheelchair to room sat upon entering room on pulse ox 69% primary nurse informed of event.

## 2016-02-04 NOTE — ED Notes (Signed)
PA at bedside.

## 2016-02-04 NOTE — ED Triage Notes (Signed)
Shortness of breath worse since last pm and non productive cough no fever voiced with some leg swelling states hx CHF.

## 2016-02-04 NOTE — ED Notes (Signed)
MD notified of pt's manual BP.
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
## 2016-02-05 ENCOUNTER — Encounter (HOSPITAL_COMMUNITY): Payer: Self-pay

## 2016-02-05 ENCOUNTER — Inpatient Hospital Stay (HOSPITAL_COMMUNITY): Payer: Managed Care, Other (non HMO)

## 2016-02-05 DIAGNOSIS — R748 Abnormal levels of other serum enzymes: Secondary | ICD-10-CM

## 2016-02-05 DIAGNOSIS — I509 Heart failure, unspecified: Secondary | ICD-10-CM

## 2016-02-05 LAB — BASIC METABOLIC PANEL
ANION GAP: 8 (ref 5–15)
BUN: 27 mg/dL — AB (ref 6–20)
CALCIUM: 8.6 mg/dL — AB (ref 8.9–10.3)
CO2: 31 mmol/L (ref 22–32)
CREATININE: 1.85 mg/dL — AB (ref 0.61–1.24)
Chloride: 104 mmol/L (ref 101–111)
GFR calc Af Amer: 46 mL/min — ABNORMAL LOW (ref >60)
GFR, EST NON AFRICAN AMERICAN: 40 mL/min — AB (ref >60)
GLUCOSE: 101 mg/dL — AB (ref 65–99)
Potassium: 3.2 mmol/L — ABNORMAL LOW (ref 3.5–5.1)
Sodium: 143 mmol/L (ref 135–145)

## 2016-02-05 LAB — MAGNESIUM: Magnesium: 2.1 mg/dL (ref 1.7–2.4)

## 2016-02-05 LAB — ECHOCARDIOGRAM COMPLETE
HEIGHTINCHES: 68 in
WEIGHTICAEL: 6209.6 [oz_av]

## 2016-02-05 MED ORDER — POTASSIUM CHLORIDE CRYS ER 20 MEQ PO TBCR
40.0000 meq | EXTENDED_RELEASE_TABLET | Freq: Every day | ORAL | Status: DC
  Administered 2016-02-05: 40 meq via ORAL
  Filled 2016-02-05: qty 2

## 2016-02-05 MED ORDER — PERFLUTREN LIPID MICROSPHERE
INTRAVENOUS | Status: AC
  Filled 2016-02-05: qty 10

## 2016-02-05 MED ORDER — PERFLUTREN LIPID MICROSPHERE
1.0000 mL | INTRAVENOUS | Status: AC | PRN
  Administered 2016-02-05: 2 mL via INTRAVENOUS
  Filled 2016-02-05: qty 10

## 2016-02-05 MED ORDER — FUROSEMIDE 40 MG PO TABS
80.0000 mg | ORAL_TABLET | Freq: Two times a day (BID) | ORAL | Status: DC
  Administered 2016-02-05 – 2016-02-07 (×4): 80 mg via ORAL
  Filled 2016-02-05 (×4): qty 2

## 2016-02-05 MED ORDER — DILTIAZEM HCL 60 MG PO TABS
60.0000 mg | ORAL_TABLET | Freq: Three times a day (TID) | ORAL | Status: DC
  Administered 2016-02-05 – 2016-02-06 (×3): 60 mg via ORAL
  Filled 2016-02-05 (×3): qty 1

## 2016-02-05 MED ORDER — POTASSIUM CHLORIDE CRYS ER 20 MEQ PO TBCR
40.0000 meq | EXTENDED_RELEASE_TABLET | Freq: Two times a day (BID) | ORAL | Status: DC
  Administered 2016-02-05 – 2016-02-07 (×4): 40 meq via ORAL
  Filled 2016-02-05 (×4): qty 2

## 2016-02-05 NOTE — Progress Notes (Signed)
Patient Name: Phillip [name] Date of Encounter: 02/05/2016  Primary Cardiologist: Dr [facility] Problem List     Principal Problem:   Acute diastolic CHF (congestive heart failure) (HCC) Active Problems:   OSA on CPAP   Acute renal failure superimposed on stage 3 chronic kidney disease (HCC)   Paroxysmal atrial fibrillation (HCC)   DVT (deep venous thrombosis) (HCC)   CHF (congestive heart failure) (HCC)   Acute on chronic diastolic CHF (congestive heart failure) ([place])     Subjective   Breathing better today. No sig palpitations. Had accidental dietary indiscretions w/ prepared foods, will pay attention to the sodium in them from now on. Had lost 40 lbs, wants to keep going.   Inpatient Medications    Scheduled Meds: . aspirin EC  81 mg Oral QHS  . cloNIDine  0.1 mg Oral TID  . diltiazem  30 mg Oral Q6H  . furosemide  80 mg Intravenous Q12H  . hydrALAZINE  50 mg Oral BID  . losartan  25 mg Oral Daily  . mouth rinse  15 mL Mouth Rinse BID  . mometasone-formoterol  2 puff Inhalation BID  . montelukast  10 mg Oral QHS  . potassium chloride  40 mEq Oral Daily  . rivaroxaban  20 mg Oral Q supper   Continuous Infusions:  PRN Meds: acetaminophen **OR** acetaminophen, albuterol, colchicine, hydrALAZINE, ondansetron **OR** ondansetron (ZOFRAN) IV   Vital Signs    Vitals:   02/05/16 0015 02/05/16 0602 02/05/16 0609 02/05/16 0925  BP: (!) 141/68 (!) 153/72    Pulse:  66  70  Resp:  20  18  Temp:  97.4 F (36.3 C)    TempSrc:  Axillary    SpO2:  98%  99%  Weight:   (!) 388 lb 1.6 oz (176 kg)   Height:        Intake/Output Summary (Last 24 hours) at 02/05/16 0945 Last data filed at 02/05/16 0606  Gross per 24 hour  Intake             1230 ml  Output             2400 ml  Net            -1170 ml   Filed Weights   02/04/16 0148 02/04/16 0456 02/05/16 0609  Weight: (!) 390 lb (176.9 kg) (!) 393 lb 4.8 oz (178.4 kg) (!) 388 lb 1.6 oz (176 kg)    Physical Exam      GEN: Well nourished, well developed, in no acute distress.  HEENT: Grossly normal.  Neck: Supple, no JVD seen, no HJR noted,no carotid bruits, or masses. Cardiac: RRR, no murmurs, rubs, or gallops. No clubbing, cyanosis, edema.  Radials/DP/PT 2+ and equal bilaterally.  Respiratory:  Respirations regular and unlabored, decreased BS bases bilaterally. GI: Soft, nontender, nondistended, BS + x 4. MS: no deformity or atrophy. Skin: warm and dry, no rash. Neuro:  Strength and sensation are intact. Psych: AAOx3.  Normal affect.  Labs    CBC  Recent Labs  02/04/16 0219 02/04/16 0539  WBC 5.4 5.3  NEUTROABS 3.6 3.4  HGB 11.9* 11.3*  HCT 37.3* 36.1*  MCV 87.4 87.2  PLT 159 341   Basic Metabolic Panel  Recent Labs  02/04/16 0539 02/05/16 0322  NA 141 143  K 3.1* 3.2*  CL 105 104  CO2 32 31  GLUCOSE 94 101*  BUN 26* 27*  CREATININE 2.09* 1.85*  CALCIUM 8.5* 8.6*  Liver Function Tests  Recent Labs  02/04/16 0539  AST 26  ALT 23  ALKPHOS 55  BILITOT 0.8  PROT 6.3*  ALBUMIN 3.8   Cardiac Enzymes  Recent Labs  02/04/16 0539 02/04/16 1108 02/04/16 1722  TROPONINI 0.06* 0.06* 0.07*   Thyroid Function Tests  Recent Labs  02/04/16 0539  TSH 1.536   B Natriuretic Peptide  Date Value Ref Range Status  02/04/2016 539.2 (H) 0.0 - 100.0 pg/mL Final    Telemetry    SR, 2 episodes ?PAT, HR 179 at one point - Personally Reviewed  ECG    n/a - Personally Reviewed  Radiology    Dg Chest 2 View  Result Date: 02/04/2016 CLINICAL DATA:  Acute onset of shortness of breath. Nonproductive cough and lower extremity swelling. Initial encounter. EXAM: CHEST  2 VIEW COMPARISON:  Chest radiograph performed 01/21/2016 FINDINGS: The lungs are well-aerated. Mild vascular congestion is noted. There is no evidence of focal opacification, pleural effusion or pneumothorax. The heart is mildly enlarged. No acute osseous abnormalities are seen. IMPRESSION: Mild vascular  congestion and mild cardiomegaly. Lungs remain grossly clear. Electronically Signed   By: Garald Balding M.D.   On: 02/04/2016 03:11    Cardiac Studies   ECHO ordered  Patient Profile      55 yo m with hx of PAF, remote PE, hx of DVT, OSA on CPAP, morbid obesity, HTN, combined chronic systolic and diastolic HF, EF 56-21% w/ G1DD, moderate AI. CHA2DS2VASc score 2 on Xarelto. Last nuc 2008 no ischemia, no hx of cath. Admitted 11/13 w/ SOB, LE edema>>CHF exacerbation  Assessment & Plan    1. Acute combined systolic and diastolic HF last echo with improved EF-   - Repeat Echo pending  - elevated troponin may be demand due to acute HF - HTN on admit with BP 187/121 - I/O neg 1.7 L so far, wt today 388, trending down - continue IV Lasix, may be able to change to PO in am  2. SVT vs PAT rate 179 with hx of asthma  - on cardizem 30 mg q 6 hr, monitor for bradycardia - HR/BP not low, will increase to 60 mg q8 hr  3. OSA uses Cpap  4. Chronic resp failure on prn home o2  5. PAF on Xarelto with CHAD2S2VASc score 2   6. HTN urgency on admit now continues to be elevated 153/72   7. Acute on chronic renal failure.  - BUN no sig change, Cr improved  - follow, per IM  8. Elevated troponin probably 2nd HTN urgency and HF   9. Hypokalemia: - 2nd diuresis - 40 meq supplement ordered - think he will need another dose today  Signed, Barrett, Rhonda, PA-C  02/05/2016, 9:45 AM   I have seen and examined the patient along with Barrett, Suanne Marker, PA-C .  I have reviewed the chart, notes and new data.  I agree with PA's note.  Key new complaints: breathing has improved Key examination changes: normal rhythm, no overt hypervolemia on exam, but obesity impedes evaluation. Key new findings / data: protracted SVT last night in 180s, brief episode this AM 150s. Renal function improving with diuresis.  PLAN: Agree with increasing diltiazem, gradually replace the hydralazine. Hopefully  arrhythmia will also subside as we correct the hypokalemia.  Sanda Klein, MD, [place] [phone] 02/05/2016, 10:57 AM

## 2016-02-05 NOTE — Plan of Care (Signed)
Problem: Nutrition: Goal: Adequate nutrition will be maintained Outcome: Progressing Gave patient information about Heart Healthy diet and low sodium diet.

## 2016-02-05 NOTE — Progress Notes (Signed)
  Echocardiogram 2D Echocardiogram with Definity has been performed.  Tresa Res 02/05/2016, 2:07 PM

## 2016-02-05 NOTE — Progress Notes (Signed)
PROGRESS NOTE    Phillip [name]  UMP:[id number] DOB: 11/03/1960 DOA: 02/04/2016 PCP: [name], MD   Brief Narrative: 55 y.o. male with history of chronic combined diastolic and systolic heart failure, hypertension, morbid obesity, OSA, paroxysmal atrial fibrillation, gout presents to the ER because of acute worsening of shortness of breath and leg swelling.  Assessment & Plan:   # Acute on chronic diastolic CHF (congestive heart failure) ([place]): Echo from 2017 and March reviewed consistent with grade 1 diastolic dysfunction and normal systolic function. Patient now presented with shortness of breath, lower extremity edema and pulmonary vascular congestion consistent with CHF. -Clinically improved with IV Lasix. He has net negative of 1.7 later. Lower extremity edema improved. I will change Lasix to oral 80 twice a day. Continue to monitor ins and outs, daily weight. -I emphasized on low salt diet with the patient. -Pending echocardiogram  #Mild elevation in troponin level: On reviewing patient's chart, patient has chronic elevation in troponin level. Patient does not have chest pain. Evaluation ongoing including echocardiogram.  #SVT: Continue to monitor in telemeter. Had SVT last night. Added diltiazem by cardiologist. Monitor electrolytes. Replete potassium per hypokalemia. Check magnesium level. Repeat lab. Cardiology evaluation appreciated.  # OSA on CPAP: Patient is morbidly obese. Continue CPAP in the hospital. Patient has chronic hypoxic respiratory failure and uses oxygen at home intermittently. Try to wean oxygen at the time. -Continue bronchodilators.  #  Acute renal failure superimposed on stage 3 chronic kidney disease ([place]): Likely hemodynamically mediated. UA unremarkable. Serum creatinine level improving with IV Lasix. Patient denied urinary symptoms. Monitor BMP. Avoid nephrotoxins. Patient will benefit from nephrology evaluation and follow-up as an outpatient.  #  Paroxysmal atrial fibrillation [facility]): Monitor heart rate. Currently on systemic anticoagulation with xarelto.  #Hypertensive urgency: Continue clonidine, hydralazine, losartan and Lasix. Added diltiazem by cardiologist for rate control. Monitor blood pressure. There is hydralazine IV when necessary orders for uncontrolled blood pressure.  DVT prophylaxis: Systemic anticoagulation. Code Status: Full code Family Communication: No family present at bedside Disposition Plan: Likely discharge home in 1-2 days.  Consultants:   Cardiologist.  Procedures: None Antimicrobials: None  Subjective: Patient was seen and examined at bedside. Patient reported feeling much better today. He had increased urine output. Denied fever, chills, chest pain, shortness of breath, nausea vomiting. Able to ambulate.   Objective: Vitals:   02/05/16 0602 02/05/16 0609 02/05/16 0925 02/05/16 1032  BP: (!) 153/72   (!) 145/69  Pulse: 66  70 70  Resp: 20  18   Temp: 97.4 F (36.3 C)     TempSrc: Axillary     SpO2: 98%  99%   Weight:  (!) 176 kg (388 lb 1.6 oz)    Height:        Intake/Output Summary (Last 24 hours) at 02/05/16 1302 Last data filed at 02/05/16 0606  Gross per 24 hour  Intake              845 ml  Output             2100 ml  Net            -1255 ml   Filed Weights   02/04/16 0148 02/04/16 0456 02/05/16 0609  Weight: (!) 176.9 kg (390 lb) (!) 178.4 kg (393 lb 4.8 oz) (!) 176 kg (388 lb 1.6 oz)    Examination:  General exam: Not in distress, sitting on bed comfortable. Respiratory system: Clear to auscultation bilateral, no wheezing  or crackle. Cardiovascular system: Regular rate rhythm, S1-S2 normal. Very trace lower extremity edema. Improving. Gastrointestinal system: Abdomen is nondistended, soft and nontender. Normal bowel sounds heard. Central nervous system: Alert and oriented. No focal neurological deficits. Extremities: Symmetric 5 x 5 power. Skin: No rashes, lesions or  ulcers Psychiatry: Judgement and insight appear normal. Mood & affect appropriate.     Data Reviewed: I have personally reviewed following labs and imaging studies  CBC:  Recent Labs Lab 02/04/16 0219 02/04/16 0539  WBC 5.4 5.3  NEUTROABS 3.6 3.4  HGB 11.9* 11.3*  HCT 37.3* 36.1*  MCV 87.4 87.2  PLT 159 734   Basic Metabolic Panel:  Recent Labs Lab 02/04/16 0219 02/04/16 0539 02/05/16 0322  NA 143 141 143  K 3.4* 3.1* 3.2*  CL 104 105 104  CO2 30 32 31  GLUCOSE 111* 94 101*  BUN 26* 26* 27*  CREATININE 2.17* 2.09* 1.85*  CALCIUM 8.8* 8.5* 8.6*   GFR: Estimated Creatinine Clearance: 72.8 mL/min (by C-G formula based on SCr of 1.85 mg/dL (H)). Liver Function Tests:  Recent Labs Lab 02/04/16 0539  AST 26  ALT 23  ALKPHOS 55  BILITOT 0.8  PROT 6.3*  ALBUMIN 3.8   No results for input(s): LIPASE, AMYLASE in the last 168 hours. No results for input(s): AMMONIA in the last 168 hours. Coagulation Profile: No results for input(s): INR, PROTIME in the last 168 hours. Cardiac Enzymes:  Recent Labs Lab 02/04/16 0219 02/04/16 0539 02/04/16 1108 02/04/16 1722  TROPONINI 0.07* 0.06* 0.06* 0.07*   BNP (last 3 results) No results for input(s): PROBNP in the last 8760 hours. HbA1C: No results for input(s): HGBA1C in the last 72 hours. CBG: No results for input(s): GLUCAP in the last 168 hours. Lipid Profile: No results for input(s): CHOL, HDL, LDLCALC, TRIG, CHOLHDL, LDLDIRECT in the last 72 hours. Thyroid Function Tests:  Recent Labs  02/04/16 0539  TSH 1.536   Anemia Panel: No results for input(s): VITAMINB12, FOLATE, FERRITIN, TIBC, IRON, RETICCTPCT in the last 72 hours. Sepsis Labs: No results for input(s): PROCALCITON, LATICACIDVEN in the last 168 hours.  No results found for this or any previous visit (from the past 240 hour(s)).       Radiology Studies: Dg Chest 2 View  Result Date: 02/04/2016 CLINICAL DATA:  Acute onset of shortness  of breath. Nonproductive cough and lower extremity swelling. Initial encounter. EXAM: CHEST  2 VIEW COMPARISON:  Chest radiograph performed 01/21/2016 FINDINGS: The lungs are well-aerated. Mild vascular congestion is noted. There is no evidence of focal opacification, pleural effusion or pneumothorax. The heart is mildly enlarged. No acute osseous abnormalities are seen. IMPRESSION: Mild vascular congestion and mild cardiomegaly. Lungs remain grossly clear. Electronically Signed   By: Garald Balding M.D.   On: 02/04/2016 03:11        Scheduled Meds: . aspirin EC  81 mg Oral QHS  . cloNIDine  0.1 mg Oral TID  . diltiazem  60 mg Oral Q8H  . furosemide  80 mg Oral BID  . hydrALAZINE  50 mg Oral BID  . losartan  25 mg Oral Daily  . mouth rinse  15 mL Mouth Rinse BID  . mometasone-formoterol  2 puff Inhalation BID  . montelukast  10 mg Oral QHS  . potassium chloride  40 mEq Oral BID  . rivaroxaban  20 mg Oral Q supper   Continuous Infusions:   LOS: 1 day    Time spent: 30 minutes  Damiah Mcdonald Tanna Furry, MD [facility] Pager [phone]  If 7PM-7AM, please contact night-coverage [email or web address] Password [email or web address] 02/05/2016, 1:02 PM

## 2016-02-06 ENCOUNTER — Inpatient Hospital Stay (HOSPITAL_COMMUNITY): Payer: Managed Care, Other (non HMO)

## 2016-02-06 DIAGNOSIS — M7989 Other specified soft tissue disorders: Secondary | ICD-10-CM

## 2016-02-06 DIAGNOSIS — G4733 Obstructive sleep apnea (adult) (pediatric): Secondary | ICD-10-CM

## 2016-02-06 DIAGNOSIS — M79609 Pain in unspecified limb: Secondary | ICD-10-CM

## 2016-02-06 DIAGNOSIS — I5033 Acute on chronic diastolic (congestive) heart failure: Secondary | ICD-10-CM

## 2016-02-06 DIAGNOSIS — I5042 Chronic combined systolic (congestive) and diastolic (congestive) heart failure: Secondary | ICD-10-CM

## 2016-02-06 DIAGNOSIS — Z9989 Dependence on other enabling machines and devices: Secondary | ICD-10-CM

## 2016-02-06 DIAGNOSIS — I480 Paroxysmal atrial fibrillation: Secondary | ICD-10-CM

## 2016-02-06 LAB — BASIC METABOLIC PANEL WITH GFR
Anion gap: 8 (ref 5–15)
BUN: 26 mg/dL — ABNORMAL HIGH (ref 6–20)
CO2: 32 mmol/L (ref 22–32)
Calcium: 9.1 mg/dL (ref 8.9–10.3)
Chloride: 103 mmol/L (ref 101–111)
Creatinine, Ser: 1.98 mg/dL — ABNORMAL HIGH (ref 0.61–1.24)
GFR calc Af Amer: 43 mL/min — ABNORMAL LOW
GFR calc non Af Amer: 37 mL/min — ABNORMAL LOW
Glucose, Bld: 102 mg/dL — ABNORMAL HIGH (ref 65–99)
Potassium: 3.5 mmol/L (ref 3.5–5.1)
Sodium: 143 mmol/L (ref 135–145)

## 2016-02-06 MED ORDER — DILTIAZEM HCL ER COATED BEADS 240 MG PO CP24
240.0000 mg | ORAL_CAPSULE | Freq: Every day | ORAL | Status: DC
  Administered 2016-02-06 – 2016-02-07 (×2): 240 mg via ORAL
  Filled 2016-02-06 (×2): qty 1

## 2016-02-06 NOTE — Progress Notes (Signed)
VASCULAR LAB PRELIMINARY  PRELIMINARY  PRELIMINARY  PRELIMINARY  Left lower extremity venous duplex completed.    Preliminary report:  Left:  No evidence of DVTor superficial thrombosis. There is an area of mixed echoes in the popliteal fossa consistent with a possible Baker's cyst.  Phillip [name], RVS 02/06/2016, 10:40 AM

## 2016-02-06 NOTE — Progress Notes (Signed)
Patient Name: Phillip [name] Date of Encounter: 02/06/2016  Primary Cardiologist: Dr. Laurena Bering Problem List     Principal Problem:   Acute diastolic CHF (congestive heart failure) (HCC) Active Problems:   OSA on CPAP   Acute renal failure superimposed on stage 3 chronic kidney disease (HCC)   Paroxysmal atrial fibrillation (HCC)   DVT (deep venous thrombosis) (HCC)   CHF (congestive heart failure) (HCC)   Acute on chronic diastolic CHF (congestive heart failure) ([place])     Subjective   Breathing better, walked halls today and did well  Inpatient Medications    Scheduled Meds: . aspirin EC  81 mg Oral QHS  . cloNIDine  0.1 mg Oral TID  . diltiazem  60 mg Oral Q8H  . furosemide  80 mg Oral BID  . hydrALAZINE  50 mg Oral BID  . losartan  25 mg Oral Daily  . mouth rinse  15 mL Mouth Rinse BID  . mometasone-formoterol  2 puff Inhalation BID  . montelukast  10 mg Oral QHS  . potassium chloride  40 mEq Oral BID  . rivaroxaban  20 mg Oral Q supper   Continuous Infusions:  PRN Meds: acetaminophen **OR** acetaminophen, albuterol, colchicine, hydrALAZINE, ondansetron **OR** ondansetron (ZOFRAN) IV   Vital Signs    Vitals:   02/06/16 0547 02/06/16 1106 02/06/16 1107 02/06/16 1109  BP: (!) 169/84     Pulse: 65  79   Resp: 18  18   Temp: 97.7 F (36.5 C)     TempSrc: Oral     SpO2: 97% 98% 98% 98%  Weight: (!) 389 lb 6.4 oz (176.6 kg)     Height:        Intake/Output Summary (Last 24 hours) at 02/06/16 1126 Last data filed at 02/06/16 0800  Gross per 24 hour  Intake             1740 ml  Output             1851 ml  Net             -111 ml   Filed Weights   02/04/16 0456 02/05/16 0609 02/06/16 0547  Weight: (!) 393 lb 4.8 oz (178.4 kg) (!) 388 lb 1.6 oz (176 kg) (!) 389 lb 6.4 oz (176.6 kg)    Physical Exam   GEN: Well nourished- morbid obesity,  in no acute distress.  HEENT: normocephalic, sclera clear, mucus membranes moist.  Neck: Supple, no  JVD Cardiac: RRR, no murmurs, rubs, or gallops. No clubbing, cyanosis,  1+ edema but improved   Radials/DP/PT 2+ and equal bilaterally.  Respiratory:  Respirations regular and unlabored, clear to auscultation bilaterally without rales, rhonchi or wheezes. GI: Abd -Soft, nontender, nondistended, BS + x 4. MS: no deformity or atrophy. Skin: warm and dry, brisk capillary refill, no obvious rash Neuro:  Alert and oriented X 3 MAE, follows commands Psych: answers questions appropriately,Normal and pleasant affect.   Labs    CBC  Recent Labs  02/04/16 0219 02/04/16 0539  WBC 5.4 5.3  NEUTROABS 3.6 3.4  HGB 11.9* 11.3*  HCT 37.3* 36.1*  MCV 87.4 87.2  PLT 159 468   Basic Metabolic Panel  Recent Labs  02/05/16 0322 02/06/16 0329  NA 143 143  K 3.2* 3.5  CL 104 103  CO2 31 32  GLUCOSE 101* 102*  BUN 27* 26*  CREATININE 1.85* 1.98*  CALCIUM 8.6* 9.1  MG 2.1  --  Liver Function Tests  Recent Labs  02/04/16 0539  AST 26  ALT 23  ALKPHOS 55  BILITOT 0.8  PROT 6.3*  ALBUMIN 3.8   No results for input(s): LIPASE, AMYLASE in the last 72 hours. Cardiac Enzymes  Recent Labs  02/04/16 0539 02/04/16 1108 02/04/16 1722  TROPONINI 0.06* 0.06* 0.07*   Thyroid Function Tests  Recent Labs  02/04/16 0539  TSH 1.536    Telemetry    SR to ST brief 6 beats of SVT. - Personally Reviewed  ECG    No new from consult - Personally Reviewed  Radiology    No results found.  Cardiac Studies   ECHO 02/05/16 Study Conclusions  - Left ventricle: The cavity size was normal. Wall thickness was   increased in a pattern of moderate LVH. Systolic function was   mildly reduced. The estimated ejection fraction was in the range   of 45% to 50%. Possible hypokinesis of the inferolateral and   inferior myocardium. Features are consistent with a pseudonormal   left ventricular filling pattern, with concomitant abnormal   relaxation and increased filling pressure (grade 2  diastolic   dysfunction). - Aortic valve: There was mild regurgitation.  Impressions:  - Extremely limited due to poor sound wave transmission; definity   used; possible hypokinesis of the inferior and inferolateral wall   with mildly reduced LV systolic function; grade 2 diastolic   dysfunction; right heart not well viisualized; suggest MUGA or   cardiac MRI if clinically indicated.  Patient Profile       55 yo m with hx of PAF, remote PE, hx of DVT, OSA on CPAP, morbid obesity, HTN, combined chronic systolic and diastolic HF, EF 15-40% w/ G1DD, moderate AI. CHA2DS2VASc score 2 on Xarelto.Last nuc 2008 no ischemia, no hx of cath. Admitted 11/13 w/ SOB, LE edema>>CHF exacerbation  Assessment & Plan    1. Acute combined systolic and diastolic HF last echo with improved EF-  - Repeat Echo with decrease in EF to 45-50% from 55 to 60% now with G2DD  Lt atrium normal in size--   - elevated troponin may be demand due to acute HF - HTN on admit with BP 187/121 - I/O neg 2.3 L so far, wt today 389, up slightly (intake of 3330 yesterday will do fluid restriction) - continue IV Lasix,  changed to PO today  2. SVT vs PAT rate 179 with hx of asthma  - on cardizem 60 mg q 8 hr, monitor for bradycardia - HR/BP not low,  - one brief episode SVT but BP elevated ? Change to Cardizem CD 240 Vs 180   3. OSA uses Cpap  4. Chronic resp failure on prn home o2  5. PAF on Xarelto with CHAD2S2VASc score 2 no a fib.  6. HTN urgency on admit now continues to be elevated 153/72 - today 169/84   7. Acute on chronic renal failure.  - BUN no sig change, Cr improved  -follow, per IM  8. Elevated troponin probably 2nd HTN urgency and HF -flat   9. Hypokalemia: - 2nd diuresis - 40 meq supplement ordered - he is on KDUr 40 meq BID ( normal mag.)   Signed, Cecilie Kicks, NP  02/06/2016, 11:26 AM  [place] Pager [phone]  After 5 or weekends  [phone]   I have seen and examined the patient along with Cecilie Kicks, NP.  I have reviewed the chart, notes and new data.  I agree  with NP's note.  Key new complaints: feeling well Key examination changes: no overt hypervolemia, BP still high Key new findings / data: very brief single episode of PAT  PLAN: Switch to sustained release diltiazem 240 mg daily. Can continue to titrate up the dose as an outpt and hopefully replace the clonidine, but BP is still high today. Probably wise to have Nephrology follow up scheduled at DC.  Sanda Klein, MD, [place] [phone] 02/06/2016, 1:13 PM

## 2016-02-06 NOTE — Progress Notes (Signed)
PROGRESS NOTE  Phillip [name]  HGD:[id number] DOB: 1960/10/02 DOA: 02/04/2016 PCP: [name], MD  Brief Narrative:  55 y.o.malewith history of chronic combined diastolic and systolic heart failure, hypertension, morbid obesity, OSA, paroxysmal atrial fibrillation, gout presents to the ER because of acute worsening of shortness of breath and leg swelling.  Feeling better today.  Changing to oral lasix today.    Assessment & Plan:   Principal Problem:   Acute diastolic CHF (congestive heart failure) (HCC) Active Problems:   OSA on CPAP   Acute renal failure superimposed on stage 3 chronic kidney disease (HCC)   Paroxysmal atrial fibrillation (HCC)   DVT (deep venous thrombosis) (HCC)   CHF (congestive heart failure) (HCC)   Acute on chronic diastolic CHF (congestive heart failure) (HCC)  Acute on chronic respiratory failure due to acute on chronic diastolic CHF (congestive heart failure) ([place]): Echo from 2017 and March reviewed consistent with grade 1 diastolic dysfunction and normal systolic function. Patient presented with shortness of breath, lower extremity edema and pulmonary vascular congestion consistent with CHF. -Clinically improved with IV Lasix -  Changed to lasix 80mg  PO BID by cardiology.  Normally on torsemide 40mg  once daily at home - Neg 2.1L since admission - ECHO:  EF 45-50% with hypokinesis of the inferolateral and inferior myocardium, grade 2 DD, mild aortic valve regurg  Mild elevation in troponin level, chronic.  Chest pain free.  May be related to SVT  SVT, episodes less severe and less frequent over last 24 hours -  Continue dilt at current dose -  appreciate cardiology assistance  OSA on CPAP: Patient is morbidly obese. Continue CPAP in the hospital.   Acute renal failure superimposed on stage 3 chronic kidney disease ([place]), likely cardiorenal syndrome, creatinine trended down with diuresis.   - nephrology evaluation as an outpatient.  Paroxysmal  atrial fibrillation [facility]): Monitor heart rate. Currently on systemic anticoagulation with xarelto.  Hypertensive urgency: Continue clonidine, hydralazine, losartan and Lasix. Added diltiazem by cardiologist for rate control. Monitor blood pressure. There is hydralazine IV when necessary orders for uncontrolled blood pressure.  Asymmetric lower extremity edema -  Duplex lower extremity:  Left leg with Baker's Cyst.  No DVT   DVT prophylaxis:  xarelto Code Status:  Full Family Communication:  Patient alone Disposition Plan:  Home likely tomorrow.     Consultants:   Cardiology, [facility]  Procedures:  ECHO Vascular duplex  Antimicrobials:   none    Subjective:  SOB has improved.  Using oxygen for comfort.  Edema is better, but left leg seems more swollen than the right leg.    Objective: Vitals:   02/06/16 1106 02/06/16 1107 02/06/16 1109 02/06/16 1550  BP:    131/61  Pulse:  79  71  Resp:  18  19  Temp:    98.1 F (36.7 C)  TempSrc:    Oral  SpO2: 98% 98% 98% 95%  Weight:      Height:        Intake/Output Summary (Last 24 hours) at 02/06/16 1635 Last data filed at 02/06/16 1553  Gross per 24 hour  Intake             2015 ml  Output             1751 ml  Net              264 ml   Filed Weights   02/04/16 0456 02/05/16 0609 02/06/16 0547  Weight: [name])  178.4 kg (393 lb 4.8 oz) (!) 176 kg (388 lb 1.6 oz) (!) 176.6 kg (389 lb 6.4 oz)    Examination:  General exam:  Adult male.  No acute distress.  HEENT:  NCAT, MMM Respiratory system: Clear to auscultation bilaterally Cardiovascular system: Regular rate and rhythm, normal S1/S2. No murmurs, rubs, gallops or clicks.  Warm extremities Gastrointestinal system: Normal active bowel sounds, soft, nondistended, nontender. MSK:  Normal tone and bulk, 2+ nonpitting edema of the LLE Neuro:  Grossly intact    Data Reviewed: I have personally reviewed following labs and imaging studies  CBC:  Recent Labs Lab  02/04/16 0219 02/04/16 0539  WBC 5.4 5.3  NEUTROABS 3.6 3.4  HGB 11.9* 11.3*  HCT 37.3* 36.1*  MCV 87.4 87.2  PLT 159 092   Basic Metabolic Panel:  Recent Labs Lab 02/04/16 0219 02/04/16 0539 02/05/16 0322 02/06/16 0329  NA 143 141 143 143  K 3.4* 3.1* 3.2* 3.5  CL 104 105 104 103  CO2 30 32 31 32  GLUCOSE 111* 94 101* 102*  BUN 26* 26* 27* 26*  CREATININE 2.17* 2.09* 1.85* 1.98*  CALCIUM 8.8* 8.5* 8.6* 9.1  MG  --   --  2.1  --    GFR: Estimated Creatinine Clearance: 68.2 mL/min (by C-G formula based on SCr of 1.98 mg/dL (H)). Liver Function Tests:  Recent Labs Lab 02/04/16 0539  AST 26  ALT 23  ALKPHOS 55  BILITOT 0.8  PROT 6.3*  ALBUMIN 3.8   No results for input(s): LIPASE, AMYLASE in the last 168 hours. No results for input(s): AMMONIA in the last 168 hours. Coagulation Profile: No results for input(s): INR, PROTIME in the last 168 hours. Cardiac Enzymes:  Recent Labs Lab 02/04/16 0219 02/04/16 0539 02/04/16 1108 02/04/16 1722  TROPONINI 0.07* 0.06* 0.06* 0.07*   BNP (last 3 results) No results for input(s): PROBNP in the last 8760 hours. HbA1C: No results for input(s): HGBA1C in the last 72 hours. CBG: No results for input(s): GLUCAP in the last 168 hours. Lipid Profile: No results for input(s): CHOL, HDL, LDLCALC, TRIG, CHOLHDL, LDLDIRECT in the last 72 hours. Thyroid Function Tests:  Recent Labs  02/04/16 0539  TSH 1.536   Anemia Panel: No results for input(s): VITAMINB12, FOLATE, FERRITIN, TIBC, IRON, RETICCTPCT in the last 72 hours. Urine analysis:    Component Value Date/Time   COLORURINE YELLOW 02/04/2016 [place] 02/04/2016 1234   LABSPEC 1.011 02/04/2016 1234   PHURINE 7.5 02/04/2016 1234   GLUCOSEU NEGATIVE 02/04/2016 1234   HGBUR NEGATIVE 02/04/2016 1234   BILIRUBINUR NEGATIVE 02/04/2016 1234   KETONESUR NEGATIVE 02/04/2016 1234   PROTEINUR NEGATIVE 02/04/2016 1234   UROBILINOGEN 1.0 12/04/2014 0546    NITRITE NEGATIVE 02/04/2016 1234   LEUKOCYTESUR NEGATIVE 02/04/2016 1234   Sepsis Labs: @LABRCNTIP (procalcitonin:4,lacticidven:4)  )No results found for this or any previous visit (from the past 240 hour(s)).    Radiology Studies: No results found.   Scheduled Meds: . aspirin EC  81 mg Oral QHS  . cloNIDine  0.1 mg Oral TID  . diltiazem  240 mg Oral Daily  . furosemide  80 mg Oral BID  . hydrALAZINE  50 mg Oral BID  . losartan  25 mg Oral Daily  . mouth rinse  15 mL Mouth Rinse BID  . mometasone-formoterol  2 puff Inhalation BID  . montelukast  10 mg Oral QHS  . potassium chloride  40 mEq Oral BID  . rivaroxaban  20 mg Oral Q supper   Continuous Infusions:   LOS: 2 days    Time spent: 30 min    Janece Canterbury, MD [facility] Pager [phone]  If 7PM-7AM, please contact night-coverage [email or web address] Password [facility] 02/06/2016, 4:35 PM

## 2016-02-06 NOTE — Progress Notes (Signed)
SATURATION QUALIFICATIONS: (This note is used to comply with regulatory documentation for home oxygen)  Patient Saturations on Room Air at Rest = 94%  Patient Saturations on Room Air while Ambulating = 90%  Patient Saturations on  Liters of oxygen while Ambulating = %  Please briefly explain why patient needs home oxygen: 

## 2016-02-07 ENCOUNTER — Telehealth: Payer: Self-pay | Admitting: Cardiology

## 2016-02-07 DIAGNOSIS — I5043 Acute on chronic combined systolic (congestive) and diastolic (congestive) heart failure: Secondary | ICD-10-CM

## 2016-02-07 LAB — BASIC METABOLIC PANEL
Anion gap: 6 (ref 5–15)
BUN: 26 mg/dL — AB (ref 6–20)
CHLORIDE: 104 mmol/L (ref 101–111)
CO2: 30 mmol/L (ref 22–32)
Calcium: 8.6 mg/dL — ABNORMAL LOW (ref 8.9–10.3)
Creatinine, Ser: 1.87 mg/dL — ABNORMAL HIGH (ref 0.61–1.24)
GFR calc Af Amer: 46 mL/min — ABNORMAL LOW (ref >60)
GFR calc non Af Amer: 39 mL/min — ABNORMAL LOW (ref >60)
GLUCOSE: 102 mg/dL — AB (ref 65–99)
POTASSIUM: 3.7 mmol/L (ref 3.5–5.1)
Sodium: 140 mmol/L (ref 135–145)

## 2016-02-07 MED ORDER — POTASSIUM CHLORIDE CRYS ER 20 MEQ PO TBCR: 20.0000 meq | EXTENDED_RELEASE_TABLET | Freq: Every day | ORAL | 0 refills | Status: DC

## 2016-02-07 MED ORDER — DILTIAZEM HCL ER COATED BEADS 240 MG PO CP24: 240.0000 mg | ORAL_CAPSULE | Freq: Every day | ORAL | 0 refills | Status: DC

## 2016-02-07 MED ORDER — TORSEMIDE 20 MG PO TABS: 40.0000 mg | ORAL_TABLET | Freq: Two times a day (BID) | ORAL | 0 refills | Status: DC

## 2016-02-07 MED ORDER — COLCHICINE 0.6 MG PO TABS: 0.3000 mg | ORAL_TABLET | Freq: Every day | ORAL | Status: DC | PRN

## 2016-02-07 MED ORDER — ALBUTEROL SULFATE HFA 108 (90 BASE) MCG/ACT IN AERS: INHALATION_SPRAY | RESPIRATORY_TRACT | 0 refills | Status: DC

## 2016-02-07 NOTE — Discharge Instructions (Signed)
Heart Failure  Heart failure means your heart has trouble pumping blood. This makes it hard for your body to work well. Heart failure is usually a long-term (chronic) condition. You must take good care of yourself and follow your doctor's treatment plan.  HOME CARE   Take your heart medicine as told by your doctor.    Do not stop taking medicine unless your doctor tells you to.    Do not skip any dose of medicine.    Refill your medicines before they run out.    Take other medicines only as told by your doctor or pharmacist.   Stay active if told by your doctor. The elderly and people with severe heart failure should talk with a doctor about physical activity.   Eat heart-healthy foods. Choose foods that are without trans fat and are low in saturated fat, cholesterol, and salt (sodium). This includes fresh or frozen fruits and vegetables, fish, lean meats, fat-free or low-fat dairy foods, whole grains, and high-fiber foods. Lentils and dried peas and beans (legumes) are also good choices.   Limit salt if told by your doctor.   Cook in a healthy way. Roast, grill, broil, bake, poach, steam, or stir-fry foods.   Limit fluids as told by your doctor.   Weigh yourself every morning. Do this after you pee (urinate) and before you eat breakfast. Write down your weight to give to your doctor.   Take your blood pressure and write it down if your doctor tells you to.   Ask your doctor how to check your pulse. Check your pulse as told.   Lose weight if told by your doctor.   Stop smoking or chewing tobacco. Do not use gum or patches that help you quit without your doctor's approval.   Schedule and go to doctor visits as told.   Nonpregnant women should have no more than 1 drink a day. Men should have no more than 2 drinks a day. Talk to your doctor about drinking alcohol.   Stop illegal drug use.   Stay current with shots (immunizations).   Manage your health conditions as told by your doctor.   Learn to  manage your stress.   Rest when you are tired.   If it is really hot outside:    Avoid intense activities.    Use air conditioning or fans, or get in a cooler place.    Avoid caffeine and alcohol.    Wear loose-fitting, lightweight, and light-colored clothing.   If it is really cold outside:    Avoid intense activities.    Layer your clothing.    Wear mittens or gloves, a hat, and a scarf when going outside.    Avoid alcohol.   Learn about heart failure and get support as needed.   Get help to maintain or improve your quality of life and your ability to care for yourself as needed.  GET HELP IF:    You gain weight quickly.   You are more Neosha Switalski of breath than usual.   You cannot do your normal activities.   You tire easily.   You cough more than normal, especially with activity.   You have any or more puffiness (swelling) in areas such as your hands, feet, ankles, or belly (abdomen).   You cannot sleep because it is hard to breathe.   You feel like your heart is beating fast (palpitations).   You get dizzy or light-headed when you stand up.  GET HELP   RIGHT AWAY IF:    You have trouble breathing.   There is a change in mental status, such as becoming less alert or not being able to focus.   You have chest pain or discomfort.   You faint.  MAKE SURE YOU:    Understand these instructions.   Will watch your condition.   Will get help right away if you are not doing well or get worse.     This information is not intended to replace advice given to you by your health care provider. Make sure you discuss any questions you have with your health care provider.     Document Released: 12/18/2007 Document Revised: 03/31/2014 Document Reviewed: 04/26/2012  Elsevier Interactive Patient Education 2017 [employer]

## 2016-02-07 NOTE — Discharge Summary (Addendum)
Physician Discharge Summary  Phillip [name] ZWC:[id number] DOB: 17-Jul-1960 DOA: 02/04/2016  PCP: [name], MD  Admit date: 02/04/2016 Discharge date: 02/07/2016  Admitted From: home  Disposition:  home  Recommendations for Outpatient Follow-up:  1. Follow up with Cardiology in 10 days at already scheduled appointment.  BMP to check creatinine and potassium.   2. Torsemide increased to 40mg  po BID 3. Discharge wt 176 kg 4. PCP:  Consider referral to nephrology for CKD stage 3.  Please address gout management as patient now has drug-drug interaction between newly added diltiazem and his colchicine  Home Health:  none  Equipment/Devices:  Already has home oxygen  Discharge Condition:  Stable, improved CODE STATUS:  full  Diet recommendation:  Low sodium 1.5L fluid restriction   Brief/Interim Summary:  55 y.o.malewith history of chronic combined diastolic and systolic heart failure, hypertension, morbid obesity, OSA, paroxysmal atrial fibrillation, gout presented to the ER because of acute worsening of shortness of breath and leg swelling.  He was diuresed with lasix 80mg  IV BID and is negative 3L since admission.  Cardiology recommended increasing his home torsemide to 40mg  PO BID.  He had recurrent episodes of SVT and was started on diltiazem which improved the frequency and maximum heart rate of his SVT episodes.  His ECHO demonstrated slightly decreased EF to 40-45% which was felt to be secondary to his SVT.     Discharge Diagnoses:  Principal Problem:   Chronic combined systolic and diastolic CHF (congestive heart failure) (HCC) Active Problems:   OSA on CPAP   Chronic kidney disease, stage 3   SVT (supraventricular tachycardia) (HCC)   Paroxysmal atrial fibrillation (HCC)   DVT (deep venous thrombosis) (HCC)   Acute on chronic diastolic CHF (congestive heart failure) (HCC)  Acute on chronic respiratory failure due to acute on chronic diastolic and systolic CHF  (congestive heart failure) (HCC) - ECHO:  EF 45-50% with hypokinesis of the inferolateral andinferior myocardium, grade 2 DD, mild aortic valve regurg -  Clinically improved with IV Lasix -  Changed to lasix 80mg  PO BID by cardiology.  Normally on torsemide 40mg  once daily at home -  Not on beta blocker per cardiology due to heart rate issues -  Continue ARB and hydralazine -  Consider addition of spironolactone if systolic heart failure persists -  Advised to stop all NSAIDS -  Neg 2.8L since admission  Mild elevation in troponin level, chronic.  Chest pain free.  Likely related to SVT  SVT, one brief burst around 11AM 11/15 which resolved after a few seconds.   -  Continue dilt at current dose -  appreciate cardiology assistance  OSA on CPAP: Patient is morbidly obese. Continue CPAP in the hospital.   Stage 3 chronic kidney disease ([place]), creatinine trended down with diuresis suggesting mild cardiorenal syndrome - nephrology evaluation as an outpatient. - continued ARB  Paroxysmal atrial fibrillation ([place]), predominantly SR with SVT during hospitalization -  Continue xarelto -  Discontinued aspirin   Hypertensive urgency: Continued clonidine, hydralazine, losartan and Lasix. Added diltiazem by cardiologist for rate control.  Asymmetric lower extremity edema -  Duplex lower extremity:  Left leg with Baker's Cyst.  No DVT  Hx of DVT, stable, continue xarelto  Gout -  Drug-drug interaction with colchicine and diltiazem -  Patient advised to only use colchicine PRN and reduce dose of 0.3mg  until he can follow up with his PCP to discuss alternatives for gout maintenance  Discharge Instructions  Discharge  Instructions    (HEART FAILURE PATIENTS) Call MD:  Anytime you have any of the following symptoms: 1) 3 pound weight gain in 24 hours or 5 pounds in 1 week 2) shortness of breath, with or without a dry hacking cough 3) swelling in the hands, feet or stomach 4) if you  have to sleep on extra pillows at night in order to breathe.    Complete by:  As directed    Diet - low sodium heart healthy    Complete by:  As directed    Increase activity slowly    Complete by:  As directed        Medication List    STOP taking these medications   aspirin 81 MG tablet   ibuprofen 200 MG tablet Commonly known as:  ADVIL,MOTRIN     TAKE these medications   albuterol (2.5 MG/3ML) 0.083% nebulizer solution Commonly known as:  PROVENTIL Take 3 mLs (2.5 mg total) by nebulization every 4 (four) hours as needed for wheezing or shortness of breath.   albuterol 108 (90 Base) MCG/ACT inhaler Commonly known as:  PROAIR HFA 2 puffs every 4 hours as needed only  if your can't catch your breath   budesonide-formoterol 160-4.5 MCG/ACT inhaler Commonly known as:  SYMBICORT Inhale 2 puffs into the lungs 2 (two) times daily.   cloNIDine 0.1 MG tablet Commonly known as:  CATAPRES Take 1 tablet (0.1 mg total) by mouth 3 (three) times daily.   colchicine 0.6 MG tablet Commonly known as:  COLCRYS Take 0.5 tablets (0.3 mg total) by mouth daily as needed (gout flare up.). For gout What changed:  how much to take   diltiazem 240 MG 24 hr capsule Commonly known as:  CARDIZEM CD Take 1 capsule (240 mg total) by mouth daily.   hydrALAZINE 50 MG tablet Commonly known as:  APRESOLINE Take 50 mg by mouth 2 (two) times daily.   losartan 25 MG tablet Commonly known as:  COZAAR Take 1 tablet (25 mg total) by mouth daily.   OXYGEN Inhale 2 L into the lungs daily. 2lpm when needed per pt   potassium chloride SA 20 MEQ tablet Commonly known as:  K-DUR,KLOR-CON Take 1 tablet (20 mEq total) by mouth daily.   rivaroxaban 20 MG Tabs tablet Commonly known as:  XARELTO Take 1 tablet (20 mg total) by mouth daily with supper.   SINGULAIR 10 MG tablet Generic drug:  montelukast Take 10 mg by mouth at bedtime.   torsemide 20 MG tablet Commonly known as:  DEMADEX Take 2  tablets (40 mg total) by mouth 2 (two) times daily. What changed:  when to take this   [place] Name: CPAP with sleep      [place] III, MD Follow up on 02/18/2016.   Specialty:  Cardiology Why:  at 2:30 Pm his Nurse Practitioner Cecilie Kicks Contact information: [address] [place] [address] [phone]        [name], MD. Schedule an appointment as soon as possible for a visit in 1 month(s).   Specialty:  Internal Medicine Why:  referral to nephrology Contact information: [address] [place] [address] [phone]          No Known Allergies  Consultations: Cardiology, [facility]   Procedures/Studies: Dg Chest 2 View  Result Date: 02/04/2016 CLINICAL DATA:  Acute onset of shortness of breath. Nonproductive cough and lower extremity swelling.  Initial encounter. EXAM: CHEST  2 VIEW COMPARISON:  Chest radiograph performed 01/21/2016 FINDINGS: The lungs are well-aerated. Mild vascular congestion is noted. There is no evidence of focal opacification, pleural effusion or pneumothorax. The heart is mildly enlarged. No acute osseous abnormalities are seen. IMPRESSION: Mild vascular congestion and mild cardiomegaly. Lungs remain grossly clear. Electronically Signed   By: Garald Balding M.D.   On: 02/04/2016 03:11   Dg Chest 2 View  Result Date: 01/21/2016 CLINICAL DATA:  Asthma.  High blood pressure. EXAM: CHEST  2 VIEW COMPARISON:  12/14/2015 . FINDINGS: Cardiomegaly with normal pulmonary vascularity. No focal infiltrate. Previously identified right upper lobe infiltrate is clear. No pleural effusion or pneumothorax. Bilateral apical pleural thickening noted most consistent with scarring. No acute bony abnormality . IMPRESSION: Over 1 No focal infiltrate. Previously identified right upper lobe infiltrate has cleared. 2. Stable cardiomegaly.  No evidence of CHF. Electronically Signed   By: Marcello Moores   Register   On: 01/21/2016 15:03   ECHO: Left ventricle: The cavity size was normal. Wall thickness was   increased in a pattern of moderate LVH. Systolic function was   mildly reduced. The estimated ejection fraction was in the range   of 45% to 50%. Possible hypokinesis of the inferolateral and   inferior myocardium. Features are consistent with a pseudonormal   left ventricular filling pattern, with concomitant abnormal   relaxation and increased filling pressure (grade 2 diastolic   dysfunction). - Aortic valve: There was mild regurgitation.   Subjective: Feeling well.  Denies SOB.  Ambulated yesterday without difficulty.  Swelling in legs continues to improve  Discharge Exam: Vitals:   02/07/16 0635 02/07/16 1052  BP: (!) 145/68 135/66  Pulse: (!) 58   Resp: 20   Temp: 98.1 F (36.7 C)    Vitals:   02/06/16 2206 02/07/16 0635 02/07/16 0850 02/07/16 1052  BP: 140/73 (!) 145/68  135/66  Pulse: 65 (!) 58    Resp: 20 20    Temp: 97.8 F (36.6 C) 98.1 F (36.7 C)    TempSrc: Oral Oral    SpO2: 95% 95% 97%   Weight:  (!) 176.4 kg (389 lb)    Height:        General: obese male, awake, alert, not in acute distress Cardiovascular: RRR, S1/S2 +, no rubs, no gallops Respiratory: CTA bilaterally, no wheezing, no rhonchi Abdominal: Soft, NT, ND, bowel sounds + Extremities:  Trace nonpitting edema of the left lower extremity with palpable baker's cyst behind left knee    The results of significant diagnostics from this hospitalization (including imaging, microbiology, ancillary and laboratory) are listed below for reference.     Microbiology: No results found for this or any previous visit (from the past 240 hour(s)).   Labs: BNP (last 3 results)  Recent Labs  07/04/15 2109 12/14/15 0327 02/04/16 0218  BNP 321.1* 318.9* 256.3*   Basic Metabolic Panel:  Recent Labs Lab 02/04/16 0219 02/04/16 0539 02/05/16 0322 02/06/16 0329 02/07/16 0334  NA 143 141 143  143 140  K 3.4* 3.1* 3.2* 3.5 3.7  CL 104 105 104 103 104  CO2 30 32 31 32 30  GLUCOSE 111* 94 101* 102* 102*  BUN 26* 26* 27* 26* 26*  CREATININE 2.17* 2.09* 1.85* 1.98* 1.87*  CALCIUM 8.8* 8.5* 8.6* 9.1 8.6*  MG  --   --  2.1  --   --    Liver Function Tests:  Recent Labs Lab 02/04/16 0539  AST 26  ALT 23  ALKPHOS 55  BILITOT 0.8  PROT 6.3*  ALBUMIN 3.8   No results for input(s): LIPASE, AMYLASE in the last 168 hours. No results for input(s): AMMONIA in the last 168 hours. CBC:  Recent Labs Lab 02/04/16 0219 02/04/16 0539  WBC 5.4 5.3  NEUTROABS 3.6 3.4  HGB 11.9* 11.3*  HCT 37.3* 36.1*  MCV 87.4 87.2  PLT 159 155   Cardiac Enzymes:  Recent Labs Lab 02/04/16 0219 02/04/16 0539 02/04/16 1108 02/04/16 1722  TROPONINI 0.07* 0.06* 0.06* 0.07*   BNP: Invalid input(s): POCBNP CBG: No results for input(s): GLUCAP in the last 168 hours. D-Dimer No results for input(s): DDIMER in the last 72 hours. Hgb A1c No results for input(s): HGBA1C in the last 72 hours. Lipid Profile No results for input(s): CHOL, HDL, LDLCALC, TRIG, CHOLHDL, LDLDIRECT in the last 72 hours. Thyroid function studies No results for input(s): TSH, T4TOTAL, T3FREE, THYROIDAB in the last 72 hours.  Invalid input(s): FREET3 Anemia work up No results for input(s): VITAMINB12, FOLATE, FERRITIN, TIBC, IRON, RETICCTPCT in the last 72 hours. Urinalysis    Component Value Date/Time   COLORURINE YELLOW 02/04/2016 [place] 02/04/2016 1234   LABSPEC 1.011 02/04/2016 1234   PHURINE 7.5 02/04/2016 1234   GLUCOSEU NEGATIVE 02/04/2016 1234   HGBUR NEGATIVE 02/04/2016 [place] 02/04/2016 [place] 02/04/2016 1234   PROTEINUR NEGATIVE 02/04/2016 1234   UROBILINOGEN 1.0 12/04/2014 0546   NITRITE NEGATIVE 02/04/2016 1234   LEUKOCYTESUR NEGATIVE 02/04/2016 1234   Sepsis Labs Invalid input(s): PROCALCITONIN,  WBC,  LACTICIDVEN   Time  coordinating discharge: Over 30 minutes  SIGNED:   Janece Canterbury, MD  [facility] 02/07/2016, 2:42 PM Pager   If 7PM-7AM, please contact night-coverage [email or web address] Password [email or web address]

## 2016-02-07 NOTE — Progress Notes (Signed)
Mr. Boehle is discharged home, no further tachycardia on monitor, discussed calling us if wt climbs. Wt today 389 and he is neg 3, 437.  BP improved.

## 2016-02-07 NOTE — Progress Notes (Signed)
Spoke with pt concerning discharge needs. He states he is good, no needs.

## 2016-02-07 NOTE — Telephone Encounter (Signed)
New message       TCM appt on 02-18-16 with Cecilie Kicks per Cecilie Kicks

## 2016-02-08 NOTE — Telephone Encounter (Signed)
TCM pt; d/c 11/16;  LM to call back

## 2016-02-11 NOTE — Telephone Encounter (Signed)
Left message to call back  

## 2016-02-13 NOTE — Telephone Encounter (Signed)
Patient contacted regarding discharge from [facility] on 02/07/16.  Patient understands to follow up with provider Sandria Senter on Mon 02/20/16 at 2:30 at [facility].. Patient understands discharge instructions? yes Patient understands medications and regiment? yes Patient understands to bring all medications to this visit? yes  He states he is doing good.  Did have some pain in his (R) big toe, gout, but is doing better now.  Wt is decreasing.  Yesterday weighed 378 and in hospital wt was 388. No SOB unless he walks up stairs.  States he has Oxygen to use PRN.  Denies edema in ankles or arms.  Understands to bring medications to appointment on Monday 11/29.  All questions addressed and verbalizes understanding of all medications and dosages.

## 2016-02-18 ENCOUNTER — Encounter: Payer: Self-pay | Admitting: Cardiology

## 2016-02-18 ENCOUNTER — Telehealth: Payer: Self-pay | Admitting: Cardiology

## 2016-02-18 ENCOUNTER — Ambulatory Visit (INDEPENDENT_AMBULATORY_CARE_PROVIDER_SITE_OTHER): Payer: Managed Care, Other (non HMO) | Admitting: Cardiology

## 2016-02-18 VITALS — BP 144/80 | HR 72 | Ht 69.0 in | Wt 376.0 lb

## 2016-02-18 DIAGNOSIS — G4733 Obstructive sleep apnea (adult) (pediatric): Secondary | ICD-10-CM

## 2016-02-18 DIAGNOSIS — N289 Disorder of kidney and ureter, unspecified: Secondary | ICD-10-CM | POA: Diagnosis not present

## 2016-02-18 DIAGNOSIS — I5042 Chronic combined systolic (congestive) and diastolic (congestive) heart failure: Secondary | ICD-10-CM

## 2016-02-18 DIAGNOSIS — I480 Paroxysmal atrial fibrillation: Secondary | ICD-10-CM

## 2016-02-18 DIAGNOSIS — Z86711 Personal history of pulmonary embolism: Secondary | ICD-10-CM

## 2016-02-18 DIAGNOSIS — Z9989 Dependence on other enabling machines and devices: Secondary | ICD-10-CM

## 2016-02-18 DIAGNOSIS — I10 Essential (primary) hypertension: Secondary | ICD-10-CM

## 2016-02-18 LAB — BASIC METABOLIC PANEL
BUN: 32 mg/dL — ABNORMAL HIGH (ref 7–25)
CALCIUM: 9.3 mg/dL (ref 8.6–10.3)
CO2: 34 mmol/L — ABNORMAL HIGH (ref 20–31)
CREATININE: 2.23 mg/dL — AB (ref 0.70–1.33)
Chloride: 102 mmol/L (ref 98–110)
Glucose, Bld: 92 mg/dL (ref 65–99)
Potassium: 3.6 mmol/L (ref 3.5–5.3)
Sodium: 144 mmol/L (ref 135–146)

## 2016-02-18 MED ORDER — DILTIAZEM HCL ER COATED BEADS 360 MG PO CP24: 360.0000 mg | ORAL_CAPSULE | Freq: Every day | ORAL | 4 refills | Status: DC

## 2016-02-18 NOTE — Telephone Encounter (Signed)
Per Nicki Reaper, verifying dose increase on Dilt and verifying provider knowledge of colchicine. I advised per today's note all prescriptions were reviewed with patient and there were no questions, also Dilt increased from 240 to 360. Scott voiced understanding and thanks.

## 2016-02-18 NOTE — Progress Notes (Signed)
Cardiology Office Note   Date:  02/18/2016   ID:  Kilo, Eshelman 18-Jan-1961, MRN [id number]  PCP:  [name], MD  Cardiologist:  Dr. [name]    Chief Complaint  Patient presents with  . Hospitalization Follow-up    HTN and diastolic HF      History of Present Illness: Phillip [name] is a 55 y.o. male who presents for post hospitalization for acute on chronic diastolic HF.  Acute renal failure on stage 3 KD and PAF along with HTN.[name]   He has a hx of PAF, remote PE, hx of DVT, OSA, morbid obesity and HTN also with combined chronic systolic and diastolic HF. With EF 40-45%.  He has been wearing CPAP.  With CHA2DS2VASc score 2 and pt on xarelto.    Last echo 06/21/15 with EF 55-60% G1DD, moderate aortic regurgitation.  Last nuc 2008 no ischemia, no hx of cath.    His SOB occurred more acutely though he does state he has been more SOB than usual.  But after walking up stairs doing laundry he back acutely SOB and tried to cool off, then took a shower and it increased.  He had some chest pressure.  He does now only with movement of getting out of bed.   Wt at discharge 389 and he is neg 3, 437.  BP improved. For increased PAF we added CCB and will titrate.   Today no SOB but still with fatigue with exertion.  His heart will race with this as well.  With any activity he has rapid HR.  No chest pain.  Continues to use CPAP.  He has seen Dr. Rayann Heman in the past, and at that time was not amenable to [facility],  If after titration of medication HR continues elevated we will pursue again.   Past Medical History:  Diagnosis Date  . CHF (congestive heart failure) ([place]) 11/15   EF 40-45%, improved with follow-up  . Diabetes mellitus without complication ([place])   . Gout   . Hypertension   . Hypertensive cardiovascular disease   . Morbid obesity ([place])   . Obstructive sleep apnea    complaint with CPAP  . OSA on CPAP   . Paroxysmal atrial fibrillation (H[facility])   . PNA (pneumonia)   . Pulmonary  embolism [facility])     Past Surgical History:  Procedure Laterality Date  . UMBILICAL HERNIA REPAIR  1992     Current Outpatient Prescriptions  Medication Sig Dispense Refill  . albuterol (PROVENTIL HFA;VENTOLIN HFA) 108 (90 Base) MCG/ACT inhaler Inhale 2 puffs into the lungs every 4 (four) hours as needed for wheezing or shortness of breath.    [name] albuterol (PROVENTIL) (2.5 MG/3ML) 0.083% nebulizer solution Take 3 mLs (2.5 mg total) by nebulization every 4 (four) hours as needed for wheezing or shortness of breath. 75 mL 12  . budesonide-formoterol (SYMBICORT) 160-4.5 MCG/ACT inhaler Inhale 2 puffs into the lungs 2 (two) times daily. 1 Inhaler 11  . cloNIDine (CATAPRES) 0.1 MG tablet Take 1 tablet (0.1 mg total) by mouth 3 (three) times daily. 90 tablet 0  . colchicine (COLCRYS) 0.6 MG tablet Take 0.5 tablets (0.3 mg total) by mouth daily as needed (gout flare up.). For gout    . diltiazem (CARDIZEM CD) 240 MG 24 hr capsule Take 1 capsule (240 mg total) by mouth daily. 30 capsule 0  . hydrALAZINE (APRESOLINE) 50 MG tablet Take 50 mg by mouth 2 (two) times daily.    [name]  losartan (COZAAR) 25 MG tablet Take 1 tablet (25 mg total) by mouth daily. 90 tablet 3  . montelukast (SINGULAIR) 10 MG tablet Take 10 mg by mouth at bedtime.    . OXYGEN Inhale 2 L into the lungs daily. 2lpm when needed per pt     . potassium chloride SA (K-DUR,KLOR-CON) 20 MEQ tablet Take 1 tablet (20 mEq total) by mouth daily. 30 tablet 0  . rivaroxaban (XARELTO) 20 MG TABS tablet Take 1 tablet (20 mg total) by mouth daily with supper. 30 tablet 11  . torsemide (DEMADEX) 20 MG tablet Take 2 tablets (40 mg total) by mouth 2 (two) times daily. 60 tablet 0  . UNABLE TO FIND Med Name: CPAP with sleep     No current facility-administered medications for this visit.     Allergies:   Patient has no known allergies.    Social History:  The patient  reports that he quit smoking about 26 years ago. His smoking use included  Cigarettes. He has a 0.25 pack-year smoking history. He has never used smokeless tobacco. He reports that he does not drink alcohol or use drugs.   Family History:  The patient's family history includes Allergies in his brother; CAD in his father; Diabetes in his father and other; Hypertension in his other.    ROS:  General:no colds or fevers,  weight continues to decrease Skin:no rashes or ulcers HEENT:no blurred vision, no congestion CV:see HPI PUL:see HPI GI:no diarrhea constipation or melena, no indigestion GU:no hematuria, no dysuria MS:no joint pain, no claudication Neuro:no syncope, no lightheadedness Endo:no diabetes, no thyroid disease  Wt Readings from Last 3 Encounters:  02/18/16 (!) 376 lb (170.6 kg)  02/07/16 (!) 389 lb (176.4 kg)  01/21/16 (!) 390 lb (176.9 kg)     PHYSICAL EXAM: VS:  BP (!) 144/80   Pulse 72   Ht 5\' 9"  (1.753 m)   Wt (!) 376 lb (170.6 kg)   BMI 55.53 kg/m  , BMI Body mass index is 55.53 kg/m. General:Pleasant affect, NAD Skin:Warm and dry, brisk capillary refill HEENT:normocephalic, sclera clear, mucus membranes moist Neck:supple, no JVD, no bruits  Heart:S1S2 RRR without murmur, gallup, rub or click Lungs:clear without rales, rhonchi, or wheezes GBT:DVVO, non tender, + BS, do not palpate liver spleen or masses Ext:no lower ext edema, 2+ pedal pulses, 2+ radial pulses Neuro:alert and oriented, MAE, follows commands, + facial symmetry    EKG:  EKG is NOT ordered today.    Recent Labs: 02/04/2016: ALT 23; B Natriuretic Peptide 539.2; Hemoglobin 11.3; Platelets 155; TSH 1.536 02/05/2016: Magnesium 2.1 02/07/2016: BUN 26; Creatinine, Ser 1.87; Potassium 3.7; Sodium 140    Lipid Panel    Component Value Date/Time   CHOL 175 02/21/2015 0431   TRIG 37 02/21/2015 0431   HDL 66 02/21/2015 0431   CHOLHDL 2.7 02/21/2015 0431   VLDL 7 02/21/2015 0431   LDLCALC 102 (H) 02/21/2015 0431       Other studies Reviewed: Additional  studies/ records that were reviewed today include:  ECHO: 02/05/16. Study Conclusions  - Left ventricle: The cavity size was normal. Wall thickness was   increased in a pattern of moderate LVH. Systolic function was   mildly reduced. The estimated ejection fraction was in the range   of 45% to 50%. Possible hypokinesis of the inferolateral and   inferior myocardium. Features are consistent with a pseudonormal   left ventricular filling pattern, with concomitant abnormal   relaxation and increased filling  pressure (grade 2 diastolic   dysfunction). - Aortic valve: There was mild regurgitation.  Impressions:  - Extremely limited due to poor sound wave transmission; definity   used; possible hypokinesis of the inferior and inferolateral wall   with mildly reduced LV systolic function; grade 2 diastolic   dysfunction; right heart not well viisualized; suggest MUGA or   cardiac MRI if clinically indicated.    ASSESSMENT AND PLAN:   1. Acute combined systolic and diastolic HF last echo with improved EF-  - Repeat Echo with decrease in EF to 45-50% from 55 to 60% now with G2DD  Lt atrium normal in size--   -euvolemic currently.   2. SVT vs PATrate 179with hx of asthma  - on cardizem 240 mg will increase to 360 mg daily, I will follow up in 2 weeks and increase again and possibly decrease clonidine.    3.OSAuses Cpap  4. Chronic resp failureon prn home o2 stable  5.PAFon Xarelto with CHAD2S2VASc score 2 ? a fib.  6. HTN urgencyon admit now improved as we increase CCB may be able to decrease clonidine.  7. Acute on chronic renal failure.  -will check BMP  8. Elevated troponin in hospitalprobably 2nd HTN urgency and HF -flat   9. Hypokalemia: - will recheck BMP   Current medicines are reviewed with the patient today.  The patient Has no concerns regarding medicines.  The following changes have been made:  See above Labs/ tests ordered today  include:see above  Disposition:   FU:  see above  Signed, Cecilie Kicks, NP  02/18/2016 2:34 PM    [address], [place], [address] [place], [place] Phone: [phone]; Fax: [phone]

## 2016-02-18 NOTE — Telephone Encounter (Signed)
Phillip [name] is calling because he have medication question

## 2016-02-18 NOTE — Patient Instructions (Addendum)
Medication Instructions:   START TAKING DILTIAZEM 360 MG ONCE A DAY   If you need a refill on your cardiac medications before your next appointment, please call your pharmacy.  Labwork: BMET TODAY    Testing/Procedures: NONE ORDERED  TODAY   Follow-Up: IN 2 WEEKS WITH [facility] FOR BP AND EKG CHECK   3 MONTHS WITH DR [name]    Any Other Special Instructions Will Be Listed Below (If Applicable).

## 2016-02-21 ENCOUNTER — Telehealth: Payer: Self-pay | Admitting: *Deleted

## 2016-02-21 DIAGNOSIS — R7989 Other specified abnormal findings of blood chemistry: Secondary | ICD-10-CM

## 2016-02-21 MED ORDER — TORSEMIDE 20 MG PO TABS: 20.0000 mg | ORAL_TABLET | Freq: Two times a day (BID) | ORAL | 0 refills | Status: DC

## 2016-02-21 NOTE — Telephone Encounter (Signed)
Pt aware of his lab results. He has been advised to Decrease the Torsemide to 20 mg bid and to D/C Losartan. Pt will come by office one day next wee for repeat bmet. Order in [place]. Medication changes made.

## 2016-02-21 NOTE — Telephone Encounter (Signed)
-----   Message from Isaiah Serge, NP sent at 02/19/2016  8:02 AM EST ----- Decrease the torsemide to one pill BID ie: 20 mg BID, and stop losartan.  Recheck BMP in 1 week.

## 2016-02-22 ENCOUNTER — Telehealth: Payer: Self-pay | Admitting: Interventional Cardiology

## 2016-02-22 NOTE — Telephone Encounter (Signed)
Mr.Tomasello is returning a call about his medication for his Gout . Please call

## 2016-02-22 NOTE — Telephone Encounter (Signed)
Spoke with pt and he asked if he is still suppose to take a half tab vs a whole tab of his colchicine.  Advised pt dose was not changed, just advised to take BID and call PCP if no improvement.  Pt verbalized understanding and was appreciative for assistance.

## 2016-02-25 ENCOUNTER — Other Ambulatory Visit (INDEPENDENT_AMBULATORY_CARE_PROVIDER_SITE_OTHER): Payer: Managed Care, Other (non HMO)

## 2016-02-25 DIAGNOSIS — R7989 Other specified abnormal findings of blood chemistry: Secondary | ICD-10-CM

## 2016-02-25 LAB — BASIC METABOLIC PANEL
BUN: 21 mg/dL (ref 7–25)
CALCIUM: 9.4 mg/dL (ref 8.6–10.3)
CO2: 31 mmol/L (ref 20–31)
Chloride: 103 mmol/L (ref 98–110)
Creat: 1.79 mg/dL — ABNORMAL HIGH (ref 0.70–1.33)
Glucose, Bld: 104 mg/dL — ABNORMAL HIGH (ref 65–99)
POTASSIUM: 3.7 mmol/L (ref 3.5–5.3)
SODIUM: 145 mmol/L (ref 135–146)

## 2016-03-05 ENCOUNTER — Telehealth: Payer: Self-pay | Admitting: Interventional Cardiology

## 2016-03-05 NOTE — Telephone Encounter (Signed)
Loop diuretics can increase uric acid which can lead to gout flares. Pt is on torsemide BID, however he likely needs to remain on this because of his heart failure. He is not on any other cardiac meds that can exacerbate gout flares. Would advise him to continue current meds, use the colchicine that's on his med list for the flare, and to limit alcohol and red meat intake because they can also exacerbate gout.

## 2016-03-05 NOTE — Telephone Encounter (Signed)
Spoke with pt and advised him of recommendations per Dr. [name]. Pt verbalized understanding and was in agreement with this plan.

## 2016-03-05 NOTE — Telephone Encounter (Signed)
Pt calling to cxl appt 03-07-16, have a flare up with gout , was told some heart meds can do that-pls advise

## 2016-03-05 NOTE — Telephone Encounter (Signed)
Spoke with pt about recommendations per [place].  Pt states that he has been taking his Colchicine 0.3mg  for a week now and gout flare up has worsened.  Pt states right ankle is painful, red and swollen.  Pt states this is the worst flare up he has had.  Pt woke up during the night to go to the restroom and states he could barely walk to get to the bathroom.  Advised pt I would send message to Dr. [name] for review and advisement.

## 2016-03-05 NOTE — Telephone Encounter (Signed)
He needs to discuss this with his primary care physician. He may need to be placed on prednisone. This is not something that I would manage as his cardiologist. If we stop the diuretic he will have recurrent swelling and shortness of breath.

## 2016-03-05 NOTE — Telephone Encounter (Signed)
Is pt on anything that can cause a gout flare up?

## 2016-03-07 ENCOUNTER — Ambulatory Visit: Payer: Self-pay | Admitting: Cardiology

## 2016-04-16 ENCOUNTER — Ambulatory Visit (INDEPENDENT_AMBULATORY_CARE_PROVIDER_SITE_OTHER): Payer: Managed Care, Other (non HMO) | Admitting: Podiatry

## 2016-04-16 VITALS — BP 166/93 | HR 77

## 2016-04-16 DIAGNOSIS — B351 Tinea unguium: Secondary | ICD-10-CM | POA: Diagnosis not present

## 2016-04-16 DIAGNOSIS — Z8739 Personal history of other diseases of the musculoskeletal system and connective tissue: Secondary | ICD-10-CM | POA: Diagnosis not present

## 2016-04-16 DIAGNOSIS — L603 Nail dystrophy: Secondary | ICD-10-CM

## 2016-04-16 DIAGNOSIS — E1340 Other specified diabetes mellitus with diabetic neuropathy, unspecified: Secondary | ICD-10-CM

## 2016-04-16 DIAGNOSIS — L608 Other nail disorders: Secondary | ICD-10-CM | POA: Diagnosis not present

## 2016-04-16 DIAGNOSIS — M79609 Pain in unspecified limb: Secondary | ICD-10-CM | POA: Diagnosis not present

## 2016-04-16 DIAGNOSIS — Z79899 Other long term (current) drug therapy: Secondary | ICD-10-CM

## 2016-04-16 MED ORDER — ALLOPURINOL 100 MG PO TABS: 100.0000 mg | ORAL_TABLET | Freq: Every day | ORAL | 6 refills | Status: DC

## 2016-04-17 LAB — HEPATIC FUNCTION PANEL
ALT: 18 U/L (ref 9–46)
AST: 22 U/L (ref 10–35)
Albumin: 3.8 g/dL (ref 3.6–5.1)
Alkaline Phosphatase: 78 U/L (ref 40–115)
BILIRUBIN DIRECT: 0.1 mg/dL (ref <=0.2)
BILIRUBIN INDIRECT: 0.4 mg/dL (ref 0.2–1.2)
Total Bilirubin: 0.5 mg/dL (ref 0.2–1.2)
Total Protein: 6.3 g/dL (ref 6.1–8.1)

## 2016-04-17 LAB — HEMOGLOBIN A1C
HEMOGLOBIN A1C: 5.5 % (ref <5.7)
MEAN PLASMA GLUCOSE: 111 mg/dL

## 2016-04-22 ENCOUNTER — Ambulatory Visit (INDEPENDENT_AMBULATORY_CARE_PROVIDER_SITE_OTHER): Payer: Managed Care, Other (non HMO) | Admitting: Internal Medicine

## 2016-04-22 ENCOUNTER — Encounter: Payer: Self-pay | Admitting: Internal Medicine

## 2016-04-22 VITALS — BP 146/82 | HR 92 | Ht 69.5 in | Wt 374.0 lb

## 2016-04-22 DIAGNOSIS — J4530 Mild persistent asthma, uncomplicated: Secondary | ICD-10-CM

## 2016-04-22 LAB — NITRIC OXIDE: Nitric Oxide: 17

## 2016-04-22 MED ORDER — BUDESONIDE-FORMOTEROL FUMARATE 80-4.5 MCG/ACT IN AERO: 2.0000 | INHALATION_SPRAY | Freq: Two times a day (BID) | RESPIRATORY_TRACT | 0 refills | Status: DC

## 2016-04-22 MED ORDER — BUDESONIDE-FORMOTEROL FUMARATE 80-4.5 MCG/ACT IN AERO: 2.0000 | INHALATION_SPRAY | Freq: Two times a day (BID) | RESPIRATORY_TRACT | 11 refills | Status: DC

## 2016-04-22 NOTE — Assessment & Plan Note (Addendum)
singulair maint rx as of 09/10/2015 with NO = 31 / no saba or other controller needed  - admit 12/14/15 with flare ? Assoc with chf/ cap - PFT's  01/21/2016  FEV1 2.11 (71 % ) ratio 84  p 12 % improvement from saba p breo 100 and on last day of pred taper  prior to study with DLCO  73/82  % corrects to 120 % for alv volume  - 01/21/2016    change to symbicort  160 2bid  - FENO 04/22/2016  =   16 on symb 160 with sub optimal technique and singuair  - 04/22/2016  After extensive coaching HFA effectiveness = 75%   > try symbicort 80 2bid   All goals of chronic asthma control met including optimal function and elimination of symptoms with minimal need for rescue therapy on symb 160 2bid despite poor hfa so should do just as well on the 80 2bid plus singulair   Contingencies discussed in full including contacting this office immediately if not controlling the symptoms using the rule of two's.     I had an extended discussion with the patient reviewing all relevant studies completed to date and  lasting 15 to 20 minutes of a 25 minute visit    Each maintenance medication was reviewed in detail including most importantly the difference between maintenance and prns and under what circumstances the prns are to be triggered using an action plan format that is not reflected in the computer generated alphabetically organized AVS.    Please see AVS for specific instructions unique to this visit that I personally wrote and verbalized to the the pt in detail and then reviewed with pt  by my nurse highlighting any  changes in therapy recommended at today's visit to their plan of care.

## 2016-04-22 NOTE — Progress Notes (Signed)
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  at wt 240 and maintained that wt of < 260 up until retired from [employer] in 1999 with smoke exp in [place] at "79% lung capacity" per pulmonologist on prn saba with progressive wt gain since then assoc with progessive doe so self referred to pulmonary clinic 07/24/2015 sp admit:   Admit date: 07/04/2015 Discharge date: 07/08/2015   Discharge Diagnoses:  Principal Problem:  Influenza with pneumonia Active Problems:  OSA on CPAP  Hypokalemia  Essential hypertension with nl echo 06/21/15   Elevated troponin  New rx: neb albuterol/ maint on coreg   07/24/2015 [facility] office visit/ Phillip [name]   Chief Complaint  Patient presents with  . Pulmonary Consult    self ref.-sob with exertion x 5 yrs.,wt. gain,[facility] last mth. with flu/pneumonia/CHF,cough-yellow,occass. wheezing,Using ventolin 1 x this wk.has neb. machine used 2x last wk.,midchest tightness,  Peak wt 429 at [facility] with pna and breathing improved since then  Back to baseline = 100 yards s stopping and be tired at slower [facility] = can't walk a nl pace on a flat grade s sob but does fine slow and flat eg shopping ok/ crosses parking lot  Can't go up steps at all s stopping multiple times  Sleeping fine on cpap per PC Has breo but prefers to use saba  rec Plan A = Automatic = BREO  Take one click each am - one or two smooth deep drags  Plan B = Backup Only use your albuterol as a rescue medication to be used if you can't catch your breath by resting or doing a relaxed purse lip breathing pattern.  - The less you use it, the better it will work when you need it. - Ok to use the inhaler up to 2 puffs  every 4 hours if you must but call for appointment if use goes up over your usual need - Don't leave home without it !!  (think of it like the spare  tire for your car)  Plan C = Crisis - only use your albuterol nebulizer if you first try Plan B and it fails to help > ok to use the nebulizer up to every 4 hours but if start needing it regularly call for immediate appointment Please schedule a follow up office visit in 6 weeks, call sooner if needed with all inhalers in hand     09/10/2015  f/u ov/Phillip [name] re:  Morbid obesity ? Asthma just on singulair and  prn saba  - no longer on coreg Chief Complaint  Patient presents with  . Follow-up    Pt states that his breathing is much improved. He has not used breo or albuterol for the past 3 wks.   improving ex tol / on NOAC for paf and h/o dvt L  rec Weight control via neg cal bal  If breathing starts getting worse or need more than twice weekly albuterol, resume the BREO one click each am Continue daily singulair (montelukast)  Return with full pfts    11/29/15  Acute flare  Take the prednisone as prescribed Take over-the-counter guaifenesin 600 mg twice a day Take Breo daily Use your albuterol as needed for chest tightness and shortness of breath We will prescribe a new CPAP machine  Admit date: 12/14/2015 Discharge date: 12/16/2015  Brief/Interim Summary:  Phillip [name] a 56 y.o.malewith medical history significant of Asthma,? COPD, atrial fibrillation, PE, DVT, diastolic heart failure, OSA, obesity. Patient presented with a one week history of cough and fatigue that suddenly worsened the day of admission. He had been seen a few weeks prior to admission by Pulmonology and prescribed a prednisone pack which he completed, but he never got back to his baseline. Hypoxemic on arrival to 85% and currently on nasal cannula O2 at 2L.  Chest x-ray was concerning for early pneumonia. Patient given Solu-Medrol, albuterol, Atrovent and Levaquin and admitted for COPD exacerbation.  He was also diuresed with lasix 40mg  IV BID.  Ins and outs were not strictly recorded and his weight was  unchanged.  Despite no diureses, he had improvement in his dyspnea and tachycardia over two days and was able to ambulate the halls on 2L [place] without significant dyspnea on the date of discharge.    Discharge Diagnoses:  Principal Problem:   CAP (community acquired pneumonia) Active Problems:   Mild persistent asthma in adult without complication   OSA on CPAP   Acute respiratory failure (HCC)   Essential hypertension   Morbid (severe) obesity due to excess calories (HCC)   SVT (supraventricular tachycardia) (HCC)   Paroxysmal atrial fibrillation (HCC)   Atrial fibrillation (Phillip [name]) [I48.91]   Long term (current) use of anticoagulants [Z79.01]   Hx pulmonary embolism   DVT (deep venous thrombosis) (HCC)  Acute respiratory failure with hypoxemia likely secondary to asthma exacerbation, possible pneumonia and possible acute on chronic diastolic heart failure.His BNP is elevated and patient is up a few pounds. Last EF of 55-60% in March 2017 -Continue Levaquin for treatment of CAP -Continued home inhalers - Resumed torsemide - Prednisone taper with 10mg  daily until follow up with Pulmonology - Started spiriva -  Close follow up with pulmonology  Hypertension, BP mildly elevated -Continue home clonidine and losartan  Atrial fibrillation, generally a-fib with runs of SVT or RVR on telemetry, rate controlled - No BB due to asthma/COPD - Continued xarelto  History of pulmonary embolism and DVT -Continued Xarelto   Obstructive sleep apnea -Continued CPAP nightly  CKD stage III, stable  Morbid obesity Body mass index is 55.5 kg/m    12/27/2015  f/u ov/Phillip [name] re: post hosp f/u transition of care  Chief Complaint  Patient presents with  . Hospitalization Follow-up    Breathing is "so so"- not back to baseline since hospital d/c. He is not wheezing and has very little cough.   typically wakes up 10 am then breo/ spiriva and no need for rescue at all  Has 02 to use only  as needed cpap x years but not clear under whose direction rec Plan A = Automatic =  BREO/ Spiriva daily on rising to start your day regardless of what time of the day it is  Plan B = Backup Only use your albuterol (proair)  Plan C = Crisis - only use your albuterol nebulizer if you first try Plan B and it fails to help > ok to use the nebulizer up to every 4 hours but if start needing it regularly call for immediate appointment Keep your appointment for pfts and follow up here same day      01/21/2016  f/u ov/Ranyah Groeneveld re: asthma/ breo 100and no spiriva since 01/19/16  Has 02 not using  Chief Complaint  Patient presents with  . Follow-up    PFT's done  today. Breathing is unchanged. He has minimal non prod cough. He has not had to use albuterol inhaler or neb.   sleeping ok on new cpap machine from [facility]  - not limited by breathing from desired activities  But very sedentary/ last pred 01/21/2016  rec We need a download from your cpap machine and we will be referring you to a sleep specialist if needed  Stop Breo and start symbicort 160 Take 2 puffs first thing in am and then another 2 puffs about 12 hours later.     04/22/2016  f/u ov/Joanne Brander re: asthma maint on symb 160 2bid / no saba Chief Complaint  Patient presents with  . Follow-up    He has had some minimal chest and nasal congestion recently. He has been coughing some, non prod. He has not needed albuterol inhaler or neb.    mild sense of throat and chest congestion on symb 160 2bid but sleeping fine on cpap s 02 and no noct/am flarees  No obvious day to day or daytime variability or assoc excess/ purulent sputum or mucus plugs or hemoptysis or cp or chest tightness, subjective wheeze or overt sinus or hb symptoms. No unusual exp hx or h/o childhood pna/ asthma or knowledge of premature birth.  Sleeping ok without nocturnal  or early am exacerbation  of respiratory  c/o's or need for noct saba. Also denies any obvious fluctuation of  symptoms with weather or environmental changes or other aggravating or alleviating factors except as outlined above   Current Medications, Allergies, Complete Past Medical History, Past Surgical History, Family History, and Social History were reviewed in [employer] record.  ROS  The following are not active complaints unless bolded sore throat, dysphagia, dental problems, itching, sneezing,  nasal congestion or excess/ purulent secretions, ear ache,   fever, chills, sweats, unintended wt loss, classically pleuritic or exertional cp,  orthopnea pnd or leg swelling, presyncope, palpitations, abdominal pain, anorexia, nausea, vomiting, diarrhea  or change in bowel or bladder habits, change in stools or urine, dysuria,hematuria,  rash, arthralgias, visual complaints, headache, numbness, weakness or ataxia or problems with walking or coordination,  change in mood/affect or memory.                     Objective:   Physical Exam    massively obese bm nad with occasional throat clearing chewing mint gum   04/22/2016        374  01/21/2016      390  12/27/2015        387   09/10/2015       372   07/24/15 389 lb 6.4 oz (176.631 kg)  07/08/15 384 lb 3.2 oz (174.272 kg)  06/21/15 390 lb 8 oz (177.13 kg)    Vital signs reviewed -  - Note on arrival 02 sats  99% on RA and bp 146/82     HEENT: nl dentition, turbinates, and oropharynx. Nl external ear canals without cough reflex   NECK :  without JVD/Nodes/TM/ nl carotid upstrokes bilaterally   LUNGS: no acc muscle use,  Nl contour chest which is clear to A and P bilaterally without cough on insp or exp maneuvers   CV:  RRR  no s3 or murmur or increase in P2, no edema   ABD:  soft and nontender with nl inspiratory excursion in the supine position. No bruits or organomegaly, bowel sounds nl  MS:  Nl gait/ ext warm without deformities,  calf tenderness, cyanosis or clubbing No obvious joint restrictions   SKIN: warm  and dry without lesions    NEURO:  alert, approp, nl sensorium with  no motor deficits       CXR PA and Lateral:   01/21/2016 :    I personally reviewed images and agree with radiology impression as follows:   FINDINGS: Cardiomegaly with normal pulmonary vascularity. No focal infiltrate. Previously identified right upper lobe infiltrate is clear. No pleural effusion or pneumothorax. Bilateral apical pleural thickening noted most consistent with scarring. No acute bony abnormality .     Assessment:

## 2016-04-22 NOTE — Patient Instructions (Addendum)
GERD (REFLUX)  is an extremely common cause of respiratory symptoms just like yours , many times with no obvious heartburn at all.    It can be treated with medication, but also with lifestyle changes including elevation of the head of your bed (ideally with 6 inch  bed blocks),  Smoking cessation, avoidance of late meals, excessive alcohol, and avoid fatty foods, chocolate, peppermint, colas, red wine, and acidic juices such as orange juice.  NO MINT OR MENTHOL PRODUCTS SO NO COUGH DROPS   USE SUGARLESS CANDY INSTEAD (Jolley ranchers or Stover's or Life Savers) or even ice chips will also do - the key is to swallow to prevent all throat clearing. NO OIL BASED VITAMINS - use powdered substitutes.  Work on inhaler technique:  relax and gently blow all the way out then take a nice smooth deep breath back in, triggering the inhaler at same time you start breathing in.  Hold for up to 5 seconds if you can. Blow out thru nose. Rinse and gargle with water when done    Plan A = Automatic = change symbicort to 80 Take 2 puffs first thing in am and then another 2 puffs about 12 hours later.      Plan B = Backup Only use your albuterol (proair) as a rescue medication to be used if you can't catch your breath by resting or doing a relaxed purse lip breathing pattern.  - The less you use it, the better it will work when you need it. - Ok to use the inhaler up to 2 puffs  every 4 hours if you must but call for appointment if use goes up over your usual need - Don't leave home without it !!  (think of it like the spare tire for your car)   Plan C = Crisis - only use your albuterol nebulizer if you first try Plan B and it fails to help > ok to use the nebulizer up to every 4 hours but if start needing it regularly call for immediate appointment   Please schedule a follow up visit in 3 months but call sooner if needed

## 2016-04-22 NOTE — Assessment & Plan Note (Signed)
ERV  01/21/2016  = 24%   Body mass index is 54.44 trending down  Lab Results  Component Value Date   TSH 1.536 02/04/2016     Contributing to gerd tendency/ doe/reviewed the need and the process to achieve and maintain neg calorie balance > defer f/u primary care including intermittently monitoring thyroid status

## 2016-04-25 ENCOUNTER — Other Ambulatory Visit: Payer: Self-pay | Admitting: Cardiology

## 2016-04-27 NOTE — Progress Notes (Signed)
   Subjective:  Patient presents today for multiple complaints. Patient states that he does have a history of gout. Last gout attack was approximately 2 months ago his ankle joints patient states that he been taking Colcrys daily preventative measures of gout Patient also complains today of possible fungal nails. Patient states that his toenails have been discolored and thickened for several years now.  Patient also states that he is not sure if he is diabetic or not. He believes that in the past he is possibly been diagnosed with prediabetes. He does complain bilateral lower extremity neuropathy.  Objective/Physical Exam General: The patient is alert and oriented x3 in no acute distress.  Dermatology:  hyperkeratotic, thickened, dystrophic nails noted 1-5 bilateral.Skin is warm, dry and supple bilateral lower extremities. Negative for open lesions or macerations.  Vascular: Palpable pedal pulses bilateral. There is mild edema noted to the bilateral lower extremities.  Capillary refill within normal limits.  Neurological: Epicritic and protective threshold grossly intact bilaterally.   Musculoskeletal Exam: Range of motion within normal limits to all pedal and ankle joints bilateral. Muscle strength 5/5 in all groups bilateral.    Assessment: #1  history of gout bilateral lower extremities #2 possible onychomycotic fungal nails 1-5 bilateral  #3 possible diabetes mellitus #4 Possible bilateral lower extremity neuropathy   Plan of Care:  #1 Patient was evaluated. #2  today we had a detailed discussion about the nature of gout. Informed the patient that Colcrys for acute inflammatory episodes. This is a strong anti-inflammatory that can cause GI ulcerations with chronic use.  #3 discontinue colchicine. Prescription for allopurinol provided   #4 today nail biopsy was performed and sent to pathology for fungal culture #5 liver function test orders were placed #6 hemoglobin A1c orders were  placed #7 return to clinic in 4 weeks   Edrick Kins, DPM [facility]  Dr. Edrick Kins, [address]                                        [place], [place] [address]                Office [phone]  Fax [phone]

## 2016-04-29 ENCOUNTER — Other Ambulatory Visit: Payer: Self-pay | Admitting: *Deleted

## 2016-04-29 MED ORDER — TORSEMIDE 20 MG PO TABS: 20.0000 mg | ORAL_TABLET | Freq: Two times a day (BID) | ORAL | 2 refills | Status: DC

## 2016-05-14 ENCOUNTER — Ambulatory Visit (INDEPENDENT_AMBULATORY_CARE_PROVIDER_SITE_OTHER): Payer: Managed Care, Other (non HMO) | Admitting: Podiatry

## 2016-05-14 DIAGNOSIS — M25572 Pain in left ankle and joints of left foot: Secondary | ICD-10-CM | POA: Diagnosis not present

## 2016-05-14 DIAGNOSIS — M659 Synovitis and tenosynovitis, unspecified: Secondary | ICD-10-CM | POA: Diagnosis not present

## 2016-05-14 DIAGNOSIS — L608 Other nail disorders: Secondary | ICD-10-CM

## 2016-05-14 DIAGNOSIS — L603 Nail dystrophy: Secondary | ICD-10-CM

## 2016-05-14 DIAGNOSIS — M109 Gout, unspecified: Secondary | ICD-10-CM | POA: Diagnosis not present

## 2016-05-14 DIAGNOSIS — B351 Tinea unguium: Secondary | ICD-10-CM

## 2016-05-14 DIAGNOSIS — M7752 Other enthesopathy of left foot: Secondary | ICD-10-CM

## 2016-05-14 DIAGNOSIS — M79609 Pain in unspecified limb: Secondary | ICD-10-CM

## 2016-05-14 MED ORDER — NONFORMULARY OR COMPOUNDED ITEM: 1.0000 [drp] | Freq: Every day | 2 refills | Status: DC

## 2016-05-14 MED ORDER — TERBINAFINE HCL 250 MG PO TABS: 250.0000 mg | ORAL_TABLET | Freq: Every day | ORAL | 2 refills | Status: DC

## 2016-05-14 MED ORDER — BETAMETHASONE SOD PHOS & ACET 6 (3-3) MG/ML IJ SUSP: 3.0000 mg | Freq: Once | INTRAMUSCULAR | Status: DC

## 2016-05-14 NOTE — Progress Notes (Signed)
   Subjective:  Patient presents today for follow-up evaluation of multiple complaints. Patient follows up 4 history of gout to the bilateral lower extremities more symptomatic on the left ankle. Patient also is following up for possible fungal nails 1-5 bilateral. He believes that he possibly diabetic and so he also follows up to determine whether or not he is a diabetic.  Objective/Physical Exam General: The patient is alert and oriented x3 in no acute distress.  Dermatology:  hyperkeratotic, thickened, dystrophic nails noted 1-5 bilateral.Skin is warm, dry and supple bilateral lower extremities. Negative for open lesions or macerations.  Vascular: Palpable pedal pulses bilateral. There is mild edema noted to the bilateral lower extremities.  Capillary refill within normal limits.  Neurological: Epicritic and protective threshold grossly intact bilaterally.   Musculoskeletal Exam: Range of motion within normal limits to all pedal and ankle joints bilateral. Muscle strength 5/5 in all groups bilateral.    Assessment: #1  history of gout bilateral lower extremities #2 onychomycotic fungal nails 1-5 bilateral  #3 ankle joint capsulitis with synovitis left  Plan of Care:  #1 Patient was evaluated. #2 today we reviewed all lab results which were taken on last visit. Hemoglobin A1c which was ordered was 5.5 mg/dL. This would make the patient not diabetic. #3 fungal culture results were reviewed today. Patient is positive for onychomycosis. Liver function tests were within normal limits. #4 injection of 0.5 mL Celestone Soluspan injected patient's left ankle joint. #5 continue allopurinol #6 prescription for terbinafine 250 mg #90 #7 prescription for antifungal nail lacquer through [facility] #8 return to clinic in 4 months  Edrick Kins, DPM [facility]  Dr. Edrick Kins, [address]                                        [place], [place] [address]                 Office [phone]  Fax [phone]

## 2016-05-16 ENCOUNTER — Ambulatory Visit (INDEPENDENT_AMBULATORY_CARE_PROVIDER_SITE_OTHER): Payer: Managed Care, Other (non HMO) | Admitting: Interventional Cardiology

## 2016-05-16 ENCOUNTER — Encounter: Payer: Self-pay | Admitting: Interventional Cardiology

## 2016-05-16 VITALS — BP 132/84 | HR 75 | Ht 69.0 in | Wt 385.8 lb

## 2016-05-16 DIAGNOSIS — I10 Essential (primary) hypertension: Secondary | ICD-10-CM | POA: Diagnosis not present

## 2016-05-16 DIAGNOSIS — G4733 Obstructive sleep apnea (adult) (pediatric): Secondary | ICD-10-CM

## 2016-05-16 DIAGNOSIS — I471 Supraventricular tachycardia, unspecified: Secondary | ICD-10-CM

## 2016-05-16 DIAGNOSIS — Z9989 Dependence on other enabling machines and devices: Secondary | ICD-10-CM | POA: Diagnosis not present

## 2016-05-16 DIAGNOSIS — I480 Paroxysmal atrial fibrillation: Secondary | ICD-10-CM | POA: Diagnosis not present

## 2016-05-16 DIAGNOSIS — I82403 Acute embolism and thrombosis of unspecified deep veins of lower extremity, bilateral: Secondary | ICD-10-CM

## 2016-05-16 DIAGNOSIS — I5033 Acute on chronic diastolic (congestive) heart failure: Secondary | ICD-10-CM | POA: Diagnosis not present

## 2016-05-16 NOTE — Progress Notes (Signed)
Cardiology Office Note    Date:  05/16/2016   ID:  Tandy, Lewin [birth year], MRN [id number]  PCP:  [name], MD  Cardiologist: Sinclair Grooms, MD   Chief Complaint  Patient presents with  . Cardiac Valve Problem    History of Present Illness:  Phillip [name] is a 56 y.o. male who presents for hx of PAF, remote PE, hx of DVT, OSA, morbid obesity and HTN also with combined chronic systolic and diastolic HF. With EF 40-45%. He has been wearing CPAP. With CHA2DS2VASc score 2 and pt on xarelto. Last echo 06/21/15 with EF 55-60% G1DD, moderate aortic regurgitation. Last nuc 2008 no ischemia, no hx of cath.   With gradual increase in physical activity, exertional tolerance is improved. No lower extremity edema. No medication side effects. No bleeding on anticoagulation therapy. He denies any prolonged episodes of palpitation. He has not had syncope.   Past Medical History:  Diagnosis Date  . CHF (congestive heart failure) ([place]) 11/15   EF 40-45%, improved with follow-up  . Diabetes mellitus without complication ([place])   . Gout   . Hypertension   . Hypertensive cardiovascular disease   . Morbid obesity ([place])   . Obstructive sleep apnea    complaint with CPAP  . OSA on CPAP   . Paroxysmal atrial fibrillation (HCC)   . PNA (pneumonia)   . Pulmonary embolism [facility])     Past Surgical History:  Procedure Laterality Date  . UMBILICAL HERNIA REPAIR  1992    Current Medications: Outpatient Medications Prior to Visit  Medication Sig Dispense Refill  . albuterol (PROVENTIL HFA;VENTOLIN HFA) 108 (90 Base) MCG/ACT inhaler Inhale 2 puffs into the lungs every 4 (four) hours as needed for wheezing or shortness of breath.    [name] albuterol (PROVENTIL) (2.5 MG/3ML) 0.083% nebulizer solution Take 3 mLs (2.5 mg total) by nebulization every 4 (four) hours as needed for wheezing or shortness of breath. 75 mL 12  . allopurinol (ZYLOPRIM) 100 MG tablet Take 1 tablet (100 mg total) by  mouth daily. 30 tablet 6  . budesonide-formoterol (SYMBICORT) 80-4.5 MCG/ACT inhaler Inhale 2 puffs into the lungs 2 (two) times daily. 1 Inhaler 0  . cloNIDine (CATAPRES) 0.1 MG tablet Take 1 tablet (0.1 mg total) by mouth 3 (three) times daily. 90 tablet 0  . colchicine (COLCRYS) 0.6 MG tablet Take 0.5 tablets (0.3 mg total) by mouth daily as needed (gout flare up.). For gout    . hydrALAZINE (APRESOLINE) 50 MG tablet Take 50 mg by mouth 2 (two) times daily.    . montelukast (SINGULAIR) 10 MG tablet Take 10 mg by mouth at bedtime.    . NONFORMULARY OR COMPOUNDED ITEM Apply 1 drop topically daily. Nail Lacquer: Fluconazole 2% Terbinafine 1% DMSO 30 each 2  . OXYGEN Inhale 2 L into the lungs daily. 2lpm when needed per pt     . rivaroxaban (XARELTO) 20 MG TABS tablet Take 1 tablet (20 mg total) by mouth daily with supper. 30 tablet 11  . terbinafine (LAMISIL) 250 MG tablet Take 1 tablet (250 mg total) by mouth daily. 30 tablet 2  . torsemide (DEMADEX) 20 MG tablet Take 1 tablet (20 mg total) by mouth 2 (two) times daily. 180 tablet 2  . UNABLE TO FIND Med Name: CPAP with sleep    . diltiazem (CARDIZEM CD) 360 MG 24 hr capsule Take 1 capsule (360 mg total) by mouth daily. (Patient not taking: Reported on 04/22/2016)  30 capsule 4   Facility-Administered Medications Prior to Visit  Medication Dose Route Frequency Provider Last Rate Last Dose  . betamethasone acetate-betamethasone sodium phosphate (CELESTONE) injection 3 mg  3 mg Intramuscular Once Edrick Kins, DPM         Allergies:   Patient has no known allergies.   Social History   Social History  . Marital status: Divorced    Spouse name: N/A  . Number of children: N/A  . Years of education: N/A   Occupational History  . disabled, drove a dump truck    Social History Main Topics  . Smoking status: Former Smoker    Packs/day: 0.25    Years: 1.00    Types: Cigarettes    Quit date: 07/23/1989  . Smokeless tobacco: Never Used  .  Alcohol use No  . Drug use: No  . Sexual activity: Not Currently   Other Topics Concern  . None   Social History Narrative   Pt lives in [place], alone.   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   Has 2 boys.     Family History:  The patient's family history includes Allergies in his brother; CAD in his father; Diabetes in his father and other; Hypertension in his other.   ROS:   Please see the history of present illness.    Left knee discomfort. Recurring episodes of gout. Morbid obesity.  All other systems reviewed and are negative.   PHYSICAL EXAM:   VS:  BP 132/84 (BP Location: Left Arm)   Pulse 75   Ht 5\' 9"  (1.753 m)   Wt (!) 385 lb 12.8 oz (175 kg)   BMI 56.97 kg/m    GEN: Well nourished, well developed, in no acute distress  HEENT: normal  Neck: no JVD, carotid bruits, or masses Cardiac: RRR; no murmurs, rubs, or gallops,no edema  Respiratory:  clear to auscultation bilaterally, normal work of breathing GI: soft, nontender, nondistended, + BS MS: no deformity or atrophy  Skin: warm and dry, no rash Neuro:  Alert and Oriented x 3, Strength and sensation are intact Psych: euthymic mood, full affect  Wt Readings from Last 3 Encounters:  05/16/16 (!) 385 lb 12.8 oz (175 kg)  04/22/16 (!) 374 lb (169.6 kg)  02/18/16 (!) 376 lb (170.6 kg)      Studies/Labs Reviewed:   EKG:  EKG  none  Recent Labs: 02/04/2016: B Natriuretic Peptide 539.2; Hemoglobin 11.3; Platelets 155; TSH 1.536 02/05/2016: Magnesium 2.1 02/25/2016: BUN 21; Creat 1.79; Potassium 3.7; Sodium 145 04/16/2016: ALT 18   Lipid Panel    Component Value Date/Time   CHOL 175 02/21/2015 0431   TRIG 37 02/21/2015 0431   HDL 66 02/21/2015 0431   CHOLHDL 2.7 02/21/2015 0431   VLDL 7 02/21/2015 0431   LDLCALC 102 (H) 02/21/2015 0431    Additional studies/ records that were reviewed today include:  None    ASSESSMENT:    1. Paroxysmal atrial fibrillation  (HCC)   2. SVT (supraventricular tachycardia) ([place])   3. Essential hypertension   4. Acute deep vein thrombosis (DVT) of both lower extremities, unspecified vein (HCC)   5. Acute on chronic diastolic CHF (congestive heart failure) ([place])   6. OSA on CPAP      PLAN:  In order of problems listed above:  1. No clinical recurrence. Patient is on Xarelto and plan is to continue. 2. Stable on current medical regimen. 3. Excellent control  on salt restriction and medications as listed above. 4. DVT being treated with Xarelto. 5. No evidence of volume overload. Salt restriction, 2 g. 6. Encourage compliance with C Pap    Medication Adjustments/Labs and Tests Ordered: Current medicines are reviewed at length with the patient today.  Concerns regarding medicines are outlined above.  Medication changes, Labs and Tests ordered today are listed in the Patient Instructions below. Patient Instructions  Medication Instructions:  Your physician recommends that you continue on your current medications as directed. Please refer to the Current Medication list given to you today.   Labwork: None ordered  Testing/Procedures: None ordered  Follow-Up: Your physician wants you to follow-up in 1 year with Dr. [name]. You will receive a reminder letter in the mail two months in advance. If you don't receive a letter, please call our office to schedule the follow-up appointment.   Any Other Special Instructions Will Be Listed Below (If Applicable).     If you need a refill on your cardiac medications before your next appointment, please call your pharmacy.      Signed, Sinclair Grooms, MD  05/16/2016 4:59 PM    [facility] [place], [place], [place]  [address] Phone: [phone]; Fax: [phone]

## 2016-05-16 NOTE — Patient Instructions (Signed)

## 2016-06-02 ENCOUNTER — Emergency Department (HOSPITAL_COMMUNITY)
Admission: EM | Admit: 2016-06-02 | Discharge: 2016-06-02 | Disposition: A | Discharge status: Home / Self Care | Payer: Managed Care, Other (non HMO) | Attending: Emergency Medicine | Admitting: Emergency Medicine

## 2016-06-02 ENCOUNTER — Encounter (HOSPITAL_COMMUNITY): Payer: Self-pay | Admitting: Family Medicine

## 2016-06-02 ENCOUNTER — Emergency Department (HOSPITAL_COMMUNITY): Payer: Managed Care, Other (non HMO)

## 2016-06-02 DIAGNOSIS — I5033 Acute on chronic diastolic (congestive) heart failure: Secondary | ICD-10-CM | POA: Diagnosis not present

## 2016-06-02 DIAGNOSIS — J45909 Unspecified asthma, uncomplicated: Secondary | ICD-10-CM | POA: Insufficient documentation

## 2016-06-02 DIAGNOSIS — Z87891 Personal history of nicotine dependence: Secondary | ICD-10-CM | POA: Insufficient documentation

## 2016-06-02 DIAGNOSIS — R0600 Dyspnea, unspecified: Secondary | ICD-10-CM | POA: Diagnosis not present

## 2016-06-02 DIAGNOSIS — Z7901 Long term (current) use of anticoagulants: Secondary | ICD-10-CM | POA: Insufficient documentation

## 2016-06-02 DIAGNOSIS — I130 Hypertensive heart and chronic kidney disease with heart failure and stage 1 through stage 4 chronic kidney disease, or unspecified chronic kidney disease: Secondary | ICD-10-CM | POA: Insufficient documentation

## 2016-06-02 DIAGNOSIS — R0609 Other forms of dyspnea: Secondary | ICD-10-CM

## 2016-06-02 DIAGNOSIS — R0602 Shortness of breath: Secondary | ICD-10-CM | POA: Diagnosis present

## 2016-06-02 DIAGNOSIS — N183 Chronic kidney disease, stage 3 (moderate): Secondary | ICD-10-CM | POA: Insufficient documentation

## 2016-06-02 LAB — COMPREHENSIVE METABOLIC PANEL
ALK PHOS: 61 U/L (ref 38–126)
ALT: 17 U/L (ref 17–63)
AST: 22 U/L (ref 15–41)
Albumin: 3.7 g/dL (ref 3.5–5.0)
Anion gap: 5 (ref 5–15)
BUN: 21 mg/dL — AB (ref 6–20)
CALCIUM: 9 mg/dL (ref 8.9–10.3)
CO2: 32 mmol/L (ref 22–32)
CREATININE: 1.8 mg/dL — AB (ref 0.61–1.24)
Chloride: 106 mmol/L (ref 101–111)
GFR calc non Af Amer: 41 mL/min — ABNORMAL LOW (ref >60)
GFR, EST AFRICAN AMERICAN: 48 mL/min — AB (ref >60)
Glucose, Bld: 99 mg/dL (ref 65–99)
Potassium: 3.5 mmol/L (ref 3.5–5.1)
SODIUM: 143 mmol/L (ref 135–145)
Total Bilirubin: 0.7 mg/dL (ref 0.3–1.2)
Total Protein: 6.6 g/dL (ref 6.5–8.1)

## 2016-06-02 LAB — CBC WITH DIFFERENTIAL/PLATELET
Basophils Absolute: 0 10*3/uL (ref 0.0–0.1)
Basophils Relative: 1 %
Eosinophils Absolute: 0.1 10*3/uL (ref 0.0–0.7)
Eosinophils Relative: 2 %
HCT: 36.1 % — ABNORMAL LOW (ref 39.0–52.0)
HEMOGLOBIN: 11.4 g/dL — AB (ref 13.0–17.0)
LYMPHS ABS: 1.6 10*3/uL (ref 0.7–4.0)
LYMPHS PCT: 31 %
MCH: 27 pg (ref 26.0–34.0)
MCHC: 31.6 g/dL (ref 30.0–36.0)
MCV: 85.3 fL (ref 78.0–100.0)
Monocytes Absolute: 0.3 10*3/uL (ref 0.1–1.0)
Monocytes Relative: 7 %
NEUTROS PCT: 59 %
Neutro Abs: 3.1 10*3/uL (ref 1.7–7.7)
Platelets: 163 10*3/uL (ref 150–400)
RBC: 4.23 MIL/uL (ref 4.22–5.81)
RDW: 15 % (ref 11.5–15.5)
WBC: 5.2 10*3/uL (ref 4.0–10.5)

## 2016-06-02 LAB — I-STAT TROPONIN, ED: TROPONIN I, POC: 0.03 ng/mL (ref 0.00–0.08)

## 2016-06-02 LAB — BRAIN NATRIURETIC PEPTIDE: B Natriuretic Peptide: 558.3 pg/mL — ABNORMAL HIGH (ref 0.0–100.0)

## 2016-06-02 MED ORDER — FUROSEMIDE 10 MG/ML IJ SOLN
40.0000 mg | Freq: Once | INTRAMUSCULAR | Status: AC
  Administered 2016-06-02: 40 mg via INTRAMUSCULAR
  Filled 2016-06-02: qty 4

## 2016-06-02 NOTE — ED Notes (Signed)
Pt ambulated to radiology

## 2016-06-02 NOTE — ED Triage Notes (Signed)
Patient reports he is experiencing shortness of breath that started a couple of days ago. Also, complains of a dry cough. Denies fever. Pt relates his symptoms could be related to his medical hx of CHF. Patient is ambulatory with no acute distress from the lobby to triage.

## 2016-06-02 NOTE — ED Provider Notes (Signed)
[facility] DEPT Provider Note   CSN: [id number] Arrival date & time: 06/02/16  1709     History   Chief Complaint Chief Complaint  Patient presents with  . Shortness of Breath    HPI Phillip [name] is a 56 y.o. male.  HPI Patient presents with several days of increased shortness of breath. Torus breath is worse with exertion and lying flat. Admits to nonproductive cough. Denies any chest pain. No fever or chills. He has had bilateral lower extremity swelling. No recent changes to his torsemide or missed doses. Past Medical History:  Diagnosis Date  . CHF (congestive heart failure) ([place]) 11/15   EF 40-45%, improved with follow-up  . Gout   . Hypertension   . Hypertensive cardiovascular disease   . Morbid obesity ([place])   . Obstructive sleep apnea    complaint with CPAP  . OSA on CPAP   . Paroxysmal atrial fibrillation (HCC)   . PNA (pneumonia)   . Pulmonary embolism [facility])     Patient Active Problem List   Diagnosis Date Noted  . Acute on chronic diastolic CHF (congestive heart failure) ([place])   . Long term (current) use of anticoagulants [Z79.01] 08/17/2015  . Hx pulmonary embolism 08/17/2015  . Deep vein thrombosis (DVT) of lower extremity ([place]) 08/17/2015  . Paroxysmal atrial fibrillation ([place]) 08/04/2015  . HCAP (healthcare-associated pneumonia) 07/04/2015  . SVT (supraventricular tachycardia) ([place]) 06/22/2015  . Acute on chronic respiratory failure with hypoxia ([name]) 06/21/2015  . Morbid (severe) obesity due to excess calories ([place]) 03/07/2015  . Acute respiratory failure ([place]) 02/20/2015  . Elevated troponin 02/20/2015  . Essential hypertension   . Gout   . CAP (community acquired pneumonia) 05/07/2014  . Mild persistent asthma in adult without complication 17/61/6073  . OSA on CPAP 05/06/2014  . Hypoxia 05/06/2014  . Chronic kidney disease, stage 3 05/06/2014  . Asthma with acute exacerbation 05/06/2014    Past Surgical History:  Procedure Laterality  Date  . UMBILICAL HERNIA REPAIR  1992       Home Medications    Prior to Admission medications   Medication Sig Start Date End Date Taking? Authorizing Provider  allopurinol (ZYLOPRIM) 100 MG tablet Take 1 tablet (100 mg total) by mouth daily. 04/16/16  Yes Edrick Kins, DPM  budesonide-formoterol (SYMBICORT) 80-4.5 MCG/ACT inhaler Inhale 2 puffs into the lungs 2 (two) times daily. 04/22/16  Yes Tanda Rockers, MD  cloNIDine (CATAPRES) 0.1 MG tablet Take 1 tablet (0.1 mg total) by mouth 3 (three) times daily. 12/16/15  Yes Janece Canterbury, MD  hydrALAZINE (APRESOLINE) 50 MG tablet Take 50 mg by mouth 2 (two) times daily.   Yes Historical Provider, MD  montelukast (SINGULAIR) 10 MG tablet Take 10 mg by mouth at bedtime.   Yes Historical Provider, MD  NONFORMULARY OR COMPOUNDED ITEM Apply 1 drop topically daily. Nail Lacquer: Fluconazole 2% Terbinafine 1% DMSO 05/14/16  Yes Edrick Kins, DPM  rivaroxaban (XARELTO) 20 MG TABS tablet Take 1 tablet (20 mg total) by mouth daily with supper. 09/07/15  Yes Thompson Grayer, MD  terbinafine (LAMISIL) 250 MG tablet Take 1 tablet (250 mg total) by mouth daily. 05/14/16  Yes Edrick Kins, DPM  torsemide (DEMADEX) 20 MG tablet Take 1 tablet (20 mg total) by mouth 2 (two) times daily. 04/29/16  Yes Isaiah Serge, NP  albuterol (PROVENTIL HFA;VENTOLIN HFA) 108 (90 Base) MCG/ACT inhaler Inhale 2 puffs into the lungs every 4 (four) hours as needed for wheezing or  shortness of breath.    Historical Provider, MD  albuterol (PROVENTIL) (2.5 MG/3ML) 0.083% nebulizer solution Take 3 mLs (2.5 mg total) by nebulization every 4 (four) hours as needed for wheezing or shortness of breath. 07/08/15   Kelvin Cellar, MD  colchicine (COLCRYS) 0.6 MG tablet Take 0.5 tablets (0.3 mg total) by mouth daily as needed (gout flare up.). For gout 02/07/16   Janece Canterbury, MD  OXYGEN Inhale 2 L into the lungs daily. 2lpm when needed per pt     Historical Provider, MD  UNABLE TO FIND Med  Name: CPAP with sleep    Historical Provider, MD    Family History Family History  Problem Relation Age of Onset  . Diabetes Father   . CAD Father   . Hypertension Other   . Diabetes Other   . Allergies Brother     Social History Social History  Substance Use Topics  . Smoking status: Former Smoker    Packs/day: 0.25    Years: 1.00    Types: Cigarettes    Quit date: 07/23/1989  . Smokeless tobacco: Never Used  . Alcohol use No     Allergies   Patient has no known allergies.   Review of Systems Review of Systems  Constitutional: Negative for chills and fever.  Respiratory: Positive for cough and shortness of breath.   Cardiovascular: Positive for leg swelling. Negative for chest pain and palpitations.  Gastrointestinal: Negative for abdominal pain, constipation, diarrhea, nausea and vomiting.  Genitourinary: Negative for dysuria, flank pain and frequency.  Musculoskeletal: Negative for back pain, myalgias, neck pain and neck stiffness.  Skin: Negative for rash and wound.  Neurological: Negative for dizziness, light-headedness, numbness and headaches.  All other systems reviewed and are negative.    Physical Exam Updated Vital Signs BP 171/76   Pulse 62   Temp 97.9 F (36.6 C) (Oral)   Resp 21   Ht 5\' 9"  (1.753 m)   Wt (!) 376 lb (170.6 kg)   SpO2 97%   BMI 55.53 kg/m   Physical Exam  Constitutional: He is oriented to person, place, and time. He appears well-developed and well-nourished. No distress.  HENT:  Head: Normocephalic and atraumatic.  Mouth/Throat: Oropharynx is clear and moist.  Eyes: EOM are normal. Pupils are equal, round, and reactive to light.  Neck: Normal range of motion. Neck supple.  Cardiovascular: Normal rate and regular rhythm.  Exam reveals no gallop and no friction rub.   No murmur heard. Pulmonary/Chest: Effort normal and breath sounds normal. No respiratory distress. He has no wheezes. He has no rales. He exhibits no tenderness.    Abdominal: Soft. Bowel sounds are normal. There is no tenderness. There is no rebound and no guarding.  Musculoskeletal: Normal range of motion. He exhibits edema. He exhibits no tenderness.  1+ bilateral lower extremity pitting edema. No tenderness or asymmetry.  Neurological: He is alert and oriented to person, place, and time.  Moving all extremities without deficit. Sensation fully intact.  Skin: Skin is warm and dry. Capillary refill takes less than 2 seconds. No rash noted. He is not diaphoretic. No erythema.  Psychiatric: He has a normal mood and affect. His behavior is normal.  Nursing note and vitals reviewed.    ED Treatments / Results  Labs (all labs ordered are listed, but only abnormal results are displayed) Labs Reviewed  CBC WITH DIFFERENTIAL/PLATELET - Abnormal; Notable for the following:       Result Value   Hemoglobin 11.4 (*)  HCT 36.1 (*)    All other components within normal limits  COMPREHENSIVE METABOLIC PANEL - Abnormal; Notable for the following:    BUN 21 (*)    Creatinine, Ser 1.80 (*)    GFR calc non Af Amer 41 (*)    GFR calc Af Amer 48 (*)    All other components within normal limits  BRAIN NATRIURETIC PEPTIDE - Abnormal; Notable for the following:    B Natriuretic Peptide 558.3 (*)    All other components within normal limits  I-STAT TROPOININ, ED    EKG  EKG Interpretation  Date/Time:  Monday June 02 2016 17:24:53 EDT Ventricular Rate:  77 PR Interval:    QRS Duration: 96 QT Interval:  433 QTC Calculation: 491 R Axis:   -29 Text Interpretation:  Sinus rhythm Borderline left axis deviation Abnormal R-wave progression, late transition Abnormal T, consider ischemia, lateral leads Confirmed by Lita Mains  MD, Arilla Hice ([id number]) on 06/02/2016 6:38:36 PM       Radiology Dg Chest 2 View  Result Date: 06/02/2016 CLINICAL DATA:  Patient reports he is experiencing shortness of breath that started a couple of days ago. Also, complains of a dry  cough. Denies fever. Pt relates his symptoms could be related to his medical hx of CHF. EXAM: CHEST  2 VIEW COMPARISON:  02/04/2016 FINDINGS: Mild enlargement of the cardiopericardial silhouette, without edema. Stable mild tortuosity of the descending thoracic aorta. The lungs appear clear. No appreciable significant bony abnormality. IMPRESSION: 1. Mild enlargement of the cardiopericardial silhouette, without edema. Mildly tortuous thoracic aorta. No acute findings. Electronically Signed   By: Van Clines M.D.   On: 06/02/2016 17:51    Procedures Procedures (including critical care time)  Medications Ordered in ED Medications  furosemide (LASIX) injection 40 mg (40 mg Intramuscular Given 06/02/16 2102)     Initial Impression / Assessment and Plan / ED Course  I have reviewed the triage vital signs and the nursing notes.  Pertinent labs & imaging results that were available during my care of the patient were reviewed by me and considered in my medical decision making (see chart for details).     BNP is mildly elevated over baseline. Given Lasix in the emergency department and advised to follow-up with his primary physician.  Final Clinical Impressions(s) / ED Diagnoses   Final diagnoses:  Dyspnea on exertion    New Prescriptions Discharge Medication List as of 06/02/2016  8:29 PM       Julianne Rice, MD 06/03/16 0040

## 2016-06-04 ENCOUNTER — Telehealth: Payer: Self-pay | Admitting: Interventional Cardiology

## 2016-06-04 NOTE — Telephone Encounter (Signed)
Find out how breathing is doing. Did lasix in ER help? May need to increase OP dose. How much is he taking.  ----- Message -----  From: Leanna Battles, RN  Sent: 06/02/2016  9:10 PM  To: Belva Crome, MD    Left message to call back

## 2016-06-06 NOTE — Telephone Encounter (Signed)
Spoke with pt and informed him of recommendations per Dr. [name].  Pt verbalized understanding and was in agreement with this plan.

## 2016-06-06 NOTE — Telephone Encounter (Signed)
Spoke with pt and he states that is breathing is really good since leaving hospital and feels Lasix did help.  Takes Torsemide 20mg  BID.  Pt wants to know if ok to take an extra dose of Torsemide when he is SOB or feels like he is holding fluid?  Advised I would send message to Dr. [name] for review and advisement.

## 2016-06-06 NOTE — Telephone Encounter (Signed)
He needs to wait daily. It is okay to take an extra torsemide on rare occasions if he has increasing shortness of breath. If this becomes relatively common, he needs to let us know so that we can make a permanent medication adjustment.

## 2016-06-20 ENCOUNTER — Telehealth: Payer: Self-pay

## 2016-06-20 NOTE — Telephone Encounter (Signed)
Sure, Rx Colcrys 0.6mg  QD #30 no refills.  Thanks, Dr. Amalia Hailey

## 2016-06-20 NOTE — Telephone Encounter (Signed)
Pt called stating that he wants an Rx for colchrys he is still having what he thinks is gout pain and the allopurinol did not help

## 2016-06-21 ENCOUNTER — Emergency Department (HOSPITAL_COMMUNITY)
Admission: EM | Admit: 2016-06-21 | Discharge: 2016-06-21 | Disposition: A | Discharge status: Home / Self Care | Payer: Managed Care, Other (non HMO) | Attending: Emergency Medicine | Admitting: Emergency Medicine

## 2016-06-21 ENCOUNTER — Encounter (HOSPITAL_COMMUNITY): Payer: Self-pay

## 2016-06-21 DIAGNOSIS — I5033 Acute on chronic diastolic (congestive) heart failure: Secondary | ICD-10-CM | POA: Diagnosis not present

## 2016-06-21 DIAGNOSIS — M79672 Pain in left foot: Secondary | ICD-10-CM

## 2016-06-21 DIAGNOSIS — N183 Chronic kidney disease, stage 3 (moderate): Secondary | ICD-10-CM | POA: Insufficient documentation

## 2016-06-21 DIAGNOSIS — Z87891 Personal history of nicotine dependence: Secondary | ICD-10-CM | POA: Insufficient documentation

## 2016-06-21 DIAGNOSIS — M10072 Idiopathic gout, left ankle and foot: Secondary | ICD-10-CM | POA: Insufficient documentation

## 2016-06-21 DIAGNOSIS — I130 Hypertensive heart and chronic kidney disease with heart failure and stage 1 through stage 4 chronic kidney disease, or unspecified chronic kidney disease: Secondary | ICD-10-CM | POA: Insufficient documentation

## 2016-06-21 DIAGNOSIS — Z79899 Other long term (current) drug therapy: Secondary | ICD-10-CM | POA: Diagnosis not present

## 2016-06-21 DIAGNOSIS — Z7901 Long term (current) use of anticoagulants: Secondary | ICD-10-CM | POA: Diagnosis not present

## 2016-06-21 MED ORDER — OXYCODONE-ACETAMINOPHEN 5-325 MG PO TABS
2.0000 | ORAL_TABLET | Freq: Once | ORAL | Status: AC
  Administered 2016-06-21: 2 via ORAL
  Filled 2016-06-21: qty 2

## 2016-06-21 MED ORDER — COLCHICINE 0.6 MG PO TABS: 0.6000 mg | ORAL_TABLET | Freq: Two times a day (BID) | ORAL | 0 refills | Status: DC

## 2016-06-21 MED ORDER — IBUPROFEN 800 MG PO TABS
800.0000 mg | ORAL_TABLET | Freq: Once | ORAL | Status: AC
  Administered 2016-06-21: 800 mg via ORAL
  Filled 2016-06-21: qty 1

## 2016-06-21 MED ORDER — HYDROCODONE-ACETAMINOPHEN 5-325 MG PO TABS: 1.0000 | ORAL_TABLET | ORAL | 0 refills | Status: DC | PRN

## 2016-06-21 NOTE — Discharge Instructions (Signed)
1. Medications: colchicine, vicodin, usual home medications 2. Treatment: rest, drink plenty of fluids,  3. Follow Up: Please followup with Dr. Amalia Hailey in 2-3 days days for discussion of your diagnoses and further evaluation after today's visit; if you do not have a primary care doctor use the resource guide provided to find one; Please return to the ER for fevers greater than 100.6, worsening pain/redness, or other concerns

## 2016-06-21 NOTE — ED Notes (Signed)
Pt's temp taken=99.6 oral

## 2016-06-21 NOTE — ED Triage Notes (Signed)
PT C/O LEFT FOOT PAIN AND SWELLING SINCE THIS MORNING. PT STS HE HAS A HX OF GOUT, AND THE GREAT TOE STARTED HURTING FIRST, THEN THE PAIN ANS SWELLING HAS BEEN TRAVELING UP HIS FOOT ALL DAY. PT STS HE TOOK HIS ALLOPURINOL TODAY W/O RELIEF. DENIES INJURY.

## 2016-06-21 NOTE — ED Notes (Signed)
PT DISCHARGED. INSTRUCTIONS AND PRESCRIPTIONS GIVEN. AAOX4. PT IN NO APPARENT DISTRESS. THE OPPORTUNITY TO ASK QUESTIONS WAS PROVIDED. 

## 2016-06-21 NOTE — ED Provider Notes (Signed)
[facility] DEPT Provider Note   CSN: [id number] Arrival date & time: 06/21/16  1905   By signing my name below, I, Delton Prairie, attest that this documentation has been prepared under the direction and in the presence of  [employer], PA-C. Electronically Signed: Delton Prairie, ED Scribe. 06/21/16. 8:19 PM.   History   Chief Complaint Chief Complaint  Patient presents with  . Foot Pain    HPI Comments:  Phillip [name] is a 56 y.o. male, with a PMHx of gout, PE, atrial fibrillation on Xarelto, HTN and CHF, who presents to the Emergency Department complaining of acute onset, "20/10", left foot pain with associated swelling which began around 3 AM today. He notes his pain originates at his left big toe and radiates up to his left ankle. His pain is worse when his foot is not elevated and upon palpation. Pt notes his last gout flare up was about 5 months ago. He has taken Allopurinol (daily x 2 months) with no relief. Pt denies gastrointestinal bleeding, any other leg swelling, numbness, wounds, palpitations or any other associated symptoms. He also denies any recent injuries to his foot, a hx of DM or a hx IV drug use. No known drug allergies noted.   Podiatry: Dr. Daylene Katayama  PCP: [name], MD  The history is provided by the patient and medical records. No language interpreter was used.    Past Medical History:  Diagnosis Date  . CHF (congestive heart failure) ([place]) 11/15   EF 40-45%, improved with follow-up  . Gout   . Hypertension   . Hypertensive cardiovascular disease   . Morbid obesity ([place])   . Obstructive sleep apnea    complaint with CPAP  . OSA on CPAP   . Paroxysmal atrial fibrillation (H[facility])   . PNA (pneumonia)   . Pulmonary embolism [facility])     Patient Active Problem List   Diagnosis Date Noted  . Acute on chronic diastolic CHF (congestive heart failure) ([place])   . Long term (current) use of anticoagulants [Z79.01] 08/17/2015  . Hx pulmonary embolism  08/17/2015  . Deep vein thrombosis (DVT) of lower extremity ([place]) 08/17/2015  . Paroxysmal atrial fibrillation ([place]) 08/04/2015  . HCAP (healthcare-associated pneumonia) 07/04/2015  . SVT (supraventricular tachycardia) ([place]) 06/22/2015  . Acute on chronic respiratory failure with hypoxia ([employer]) 06/21/2015  . Morbid (severe) obesity due to excess calories ([place]) 03/07/2015  . Acute respiratory failure ([place]) 02/20/2015  . Elevated troponin 02/20/2015  . Essential hypertension   . Gout   . CAP (community acquired pneumonia) 05/07/2014  . Mild persistent asthma in adult without complication 97/67/3419  . OSA on CPAP 05/06/2014  . Hypoxia 05/06/2014  . Chronic kidney disease, stage 3 05/06/2014  . Asthma with acute exacerbation 05/06/2014    Past Surgical History:  Procedure Laterality Date  . UMBILICAL HERNIA REPAIR  1992    Home Medications    Prior to Admission medications   Medication Sig Start Date End Date Taking? Authorizing Provider  albuterol (PROVENTIL HFA;VENTOLIN HFA) 108 (90 Base) MCG/ACT inhaler Inhale 2 puffs into the lungs every 4 (four) hours as needed for wheezing or shortness of breath.    Historical Provider, MD  albuterol (PROVENTIL) (2.5 MG/3ML) 0.083% nebulizer solution Take 3 mLs (2.5 mg total) by nebulization every 4 (four) hours as needed for wheezing or shortness of breath. 07/08/15   Kelvin Cellar, MD  allopurinol (ZYLOPRIM) 100 MG tablet Take 1 tablet (100 mg total) by mouth daily.  04/16/16   Edrick Kins, DPM  budesonide-formoterol (SYMBICORT) 80-4.5 MCG/ACT inhaler Inhale 2 puffs into the lungs 2 (two) times daily. 04/22/16   Tanda Rockers, MD  cloNIDine (CATAPRES) 0.1 MG tablet Take 1 tablet (0.1 mg total) by mouth 3 (three) times daily. 12/16/15   Janece Canterbury, MD  colchicine 0.6 MG tablet Take 1 tablet (0.6 mg total) by mouth 2 (two) times daily. 06/21/16   Letrell Attwood, PA-C  hydrALAZINE (APRESOLINE) 50 MG tablet Take 50 mg by mouth 2 (two) times  daily.    Historical Provider, MD  HYDROcodone-acetaminophen (NORCO/VICODIN) 5-325 MG tablet Take 1 tablet by mouth every 4 (four) hours as needed. 06/21/16   Ayad Nieman, PA-C  montelukast (SINGULAIR) 10 MG tablet Take 10 mg by mouth at bedtime.    Historical Provider, MD  NONFORMULARY OR COMPOUNDED ITEM Apply 1 drop topically daily. [place]: Fluconazole 2% Terbinafine 1% DMSO 05/14/16   Edrick Kins, DPM  OXYGEN Inhale 2 L into the lungs daily. 2lpm when needed per pt     Historical Provider, MD  rivaroxaban (XARELTO) 20 MG TABS tablet Take 1 tablet (20 mg total) by mouth daily with supper. 09/07/15   Thompson Grayer, MD  terbinafine (LAMISIL) 250 MG tablet Take 1 tablet (250 mg total) by mouth daily. 05/14/16   Edrick Kins, DPM  torsemide (DEMADEX) 20 MG tablet Take 1 tablet (20 mg total) by mouth 2 (two) times daily. 04/29/16   Isaiah Serge, NP  UNABLE TO FIND Med Name: CPAP with sleep    Historical Provider, MD    Family History Family History  Problem Relation Age of Onset  . Diabetes Father   . CAD Father   . Hypertension Other   . Diabetes Other   . Allergies Brother     Social History Social History  Substance Use Topics  . Smoking status: Former Smoker    Packs/day: 0.25    Years: 1.00    Types: Cigarettes    Quit date: 07/23/1989  . Smokeless tobacco: Never Used  . Alcohol use No     Allergies   Patient has no known allergies.   Review of Systems Review of Systems  Cardiovascular: Negative for palpitations and leg swelling.  Musculoskeletal: Positive for joint swelling and myalgias.  Skin: Negative for wound.  Neurological: Negative for numbness.    Physical Exam Updated Vital Signs BP (!) 165/71 (BP Location: Left Arm)   Pulse 82   Temp 99.8 F (37.7 C) (Oral)   Resp 20   Ht 5\' 9"  (1.753 m)   Wt (!) 169.6 kg   SpO2 93%   BMI 55.23 kg/m   Physical Exam  Constitutional: He appears well-developed and well-nourished. No distress.  HENT:    Head: Normocephalic and atraumatic.  Eyes: Conjunctivae are normal.  Neck: Normal range of motion.  Cardiovascular: Normal rate, regular rhythm and intact distal pulses.   Capillary refill < 3 sec  Pulmonary/Chest: Effort normal and breath sounds normal.  Musculoskeletal: He exhibits tenderness. He exhibits no edema.  Left great toe with erythema and TTP at the MTP, limited ROM due to pain.  No peripheral edema   Neurological: He is alert. Coordination normal.  Sensation intact Strength 5/5 in the LLE, but pt refuses to move his left great toe for strength testing  Skin: Skin is warm and dry. He is not diaphoretic. There is erythema.  No tenting of the skin  Psychiatric: He has a normal mood  and affect.  Nursing note and vitals reviewed.    ED Treatments / Results  DIAGNOSTIC STUDIES:  Oxygen Saturation is 94% on [place], adequate by my interpretation.    COORDINATION OF CARE:  8:11 PM Discussed treatment plan with pt at bedside and pt agreed to plan.   Procedures Procedures (including critical care time)  Medications Ordered in ED Medications  oxyCODONE-acetaminophen (PERCOCET/ROXICET) 5-325 MG per tablet 2 tablet (2 tablets Oral Given 06/21/16 2022)  ibuprofen (ADVIL,MOTRIN) tablet 800 mg (800 mg Oral Given 06/21/16 2022)     Initial Impression / Assessment and Plan / ED Course  I have reviewed the triage vital signs and the nursing notes.  Pertinent labs & imaging results that were available during my care of the patient were reviewed by me and considered in my medical decision making (see chart for details).     Pt presents with monoarticular pain, swelling and erythema.  Pt is afebrile.  Doubt septic joint, pt is low risk and has hx of gout. HTN noted here, but pt with hs of same and no signs of hypertensive urgency. Pt without known peptic ulcer disease and not receiving concurrent treatment on warfarin. Pt dc with colchicine as this is his preference and vicodin.  Discussed that pt should respond to treatment with in 24 hour of begining treatment & likely resolve in 2-3 days. He is to follow-up with his podiatrist.    Final Clinical Impressions(s) / ED Diagnoses   Final diagnoses:  Foot pain, left  Acute idiopathic gout involving toe of left foot    New Prescriptions New Prescriptions   COLCHICINE 0.6 MG TABLET    Take 1 tablet (0.6 mg total) by mouth 2 (two) times daily.   HYDROCODONE-ACETAMINOPHEN (NORCO/VICODIN) 5-325 MG TABLET    Take 1 tablet by mouth every 4 (four) hours as needed.    I personally performed the services described in this documentation, which was scribed in my presence. The recorded information has been reviewed and is accurate.    Jarrett Soho Nazario Russom, PA-C 06/21/16 2121    Virgel Manifold, MD 06/23/16 [phone]

## 2016-06-24 ENCOUNTER — Other Ambulatory Visit: Payer: Self-pay

## 2016-06-24 NOTE — Progress Notes (Signed)
error 

## 2016-06-30 ENCOUNTER — Telehealth: Payer: Self-pay | Admitting: *Deleted

## 2016-06-30 NOTE — Telephone Encounter (Signed)
Pt states he is currently taking Allopurinol and is having a gout flare and request colcrys. I informed pt that Dr.Evans had not written for the Colcrys, [place], PA-C had prescribed. I told him Dr. Amalia Hailey would want to see him, and he may want to contact [place], PA-C for a refill if he did not want an appt with Dr. Amalia Hailey at this time. Pt states understanding.

## 2016-07-07 ENCOUNTER — Ambulatory Visit (INDEPENDENT_AMBULATORY_CARE_PROVIDER_SITE_OTHER): Payer: Managed Care, Other (non HMO) | Admitting: Podiatry

## 2016-07-07 ENCOUNTER — Ambulatory Visit (INDEPENDENT_AMBULATORY_CARE_PROVIDER_SITE_OTHER): Payer: Managed Care, Other (non HMO)

## 2016-07-07 DIAGNOSIS — M79672 Pain in left foot: Secondary | ICD-10-CM | POA: Diagnosis not present

## 2016-07-07 DIAGNOSIS — M109 Gout, unspecified: Secondary | ICD-10-CM

## 2016-07-07 DIAGNOSIS — M10472 Other secondary gout, left ankle and foot: Secondary | ICD-10-CM

## 2016-07-07 MED ORDER — COLCHICINE 0.6 MG PO TABS: 0.6000 mg | ORAL_TABLET | Freq: Every day | ORAL | 0 refills | Status: DC

## 2016-07-07 MED ORDER — ALLOPURINOL 300 MG PO TABS: 300.0000 mg | ORAL_TABLET | Freq: Every day | ORAL | 6 refills | Status: DC

## 2016-07-09 NOTE — Progress Notes (Signed)
   Subjective: Patient is a 56 year old male presenting today with a complaint of left great toe pain that began approximately 3 weeks ago. She reports associated pain along the medial side of the left foot. She states she went to the ED on 06/21/16 for the same complaint. She states the allopurinol was helpful until the gout flared up.    Objective/Physical Exam General: The patient is alert and oriented x3 in no acute distress.  Dermatology: Skin is warm, dry and supple bilateral lower extremities. Negative for open lesions or macerations.  Vascular: Palpable pedal pulses bilaterally. No edema or erythema noted. Capillary refill within normal limits.  Neurological: Epicritic and protective threshold grossly intact bilaterally.   Musculoskeletal Exam: Pain with palpation to the left first MPJ. Range of motion within normal limits to all pedal and ankle joints bilateral. Muscle strength 5/5 in all groups bilateral.   Radiographic Exam:  Normal osseous mineralization. Joint spaces preserved. No fracture/dislocation/boney destruction.    Assessment: #1 acute gout left first MPJ   Plan of Care:  #1 Patient was evaluated. #2 injection of 0.5 mL Celestone Soluspan injected into the left first MPJ #3 prescription for colchicine 0.6 mg #4 prescription for allopurinol 300 mg #5 return to clinic in 8 weeks   Edrick Kins, DPM [facility]  Dr. Edrick Kins, [address]                                        [place], [place] [address]                Office [phone]  Fax [phone]

## 2016-07-14 MED ORDER — BETAMETHASONE SOD PHOS & ACET 6 (3-3) MG/ML IJ SUSP: 3.0000 mg | Freq: Once | INTRAMUSCULAR | Status: DC

## 2016-07-21 ENCOUNTER — Ambulatory Visit (INDEPENDENT_AMBULATORY_CARE_PROVIDER_SITE_OTHER): Payer: Managed Care, Other (non HMO) | Admitting: Internal Medicine

## 2016-07-21 ENCOUNTER — Encounter: Payer: Self-pay | Admitting: Internal Medicine

## 2016-07-21 VITALS — BP 124/86 | HR 83 | Ht 69.0 in | Wt 362.0 lb

## 2016-07-21 DIAGNOSIS — J4530 Mild persistent asthma, uncomplicated: Secondary | ICD-10-CM | POA: Diagnosis not present

## 2016-07-21 MED ORDER — BUDESONIDE-FORMOTEROL FUMARATE 80-4.5 MCG/ACT IN AERO: 2.0000 | INHALATION_SPRAY | Freq: Two times a day (BID) | RESPIRATORY_TRACT | 0 refills | Status: DC

## 2016-07-21 NOTE — Progress Notes (Signed)
Subjective:     Patient ID: Phillip [name], male   DOB: [birth year],     MRN: [id number]   Brief patient profile:  56  yobm no significant  smoking hx / played [employer] at [employer] level  at wt 240 and maintained that wt of < 260 up until retired from [employer] in 1999 with smoke exp in [place] at "79% lung capacity" per pulmonologist on prn saba with progressive wt gain since then assoc with progessive doe so self referred to pulmonary clinic 07/24/2015 sp admit:   Admit date: 07/04/2015 Discharge date: 07/08/2015   Discharge Diagnoses:  Principal Problem:  Influenza with pneumonia Active Problems:  OSA on CPAP  Hypokalemia  Essential hypertension with nl echo 06/21/15   Elevated troponin  New rx: neb albuterol/ maint on coreg   07/24/2015 1st [facility] office visit/ Amando Ishikawa   Chief Complaint  Patient presents with  . Pulmonary Consult    self ref.-sob with exertion x 5 yrs.,wt. gain,[facility] last mth. with flu/pneumonia/CHF,cough-yellow,occass. wheezing,Using ventolin 1 x this wk.has neb. machine used 2x last wk.,midchest tightness,  Peak wt 429 at [facility] with pna and breathing improved since then  Back to baseline = 100 yards s stopping and be tired at slower [facility] = can't walk a nl pace on a flat grade s sob but does fine slow and flat eg shopping ok/ crosses parking lot  Can't go up steps at all s stopping multiple times  Sleeping fine on cpap per PC Has breo but prefers to use saba  rec Plan A = Automatic = BREO  Take one click each am - one or two smooth deep drags  Plan B = Backup Only use your albuterol as a rescue medication to be used if you can't catch your breath by resting or doing a relaxed purse lip breathing pattern.  - The less you use it, the better it will work when you need it. - Ok to use the inhaler up to 2 puffs  every 4 hours if you must but call for appointment if use goes up over your usual need - Don't leave home without it !!  (think of it like the spare  tire for your car)  Plan C = Crisis - only use your albuterol nebulizer if you first try Plan B and it fails to help > ok to use the nebulizer up to every 4 hours but if start needing it regularly call for immediate appointment Please schedule a follow up office visit in 6 weeks, call sooner if needed with all inhalers in hand     09/10/2015  f/u ov/Burnell Matlin re:  Morbid obesity ? Asthma just on singulair and  prn saba  - no longer on coreg Chief Complaint  Patient presents with  . Follow-up    Pt states that his breathing is much improved. He has not used breo or albuterol for the past 3 wks.   improving ex tol / on NOAC for paf and h/o dvt L  rec Weight control via neg cal bal  If breathing starts getting worse or need more than twice weekly albuterol, resume the BREO one click each am Continue daily singulair (montelukast)  Return with full pfts    11/29/15  Acute flare  Take the prednisone as prescribed Take over-the-counter guaifenesin 600 mg twice a day Take Breo daily Use your albuterol as needed for chest tightness and shortness of breath We will prescribe a new CPAP machine  Admit date: 12/14/2015 Discharge date: 12/16/2015  Brief/Interim Summary:  [name] a 56 y.o.malewith medical history significant of Asthma,? COPD, atrial fibrillation, PE, DVT, diastolic heart failure, OSA, obesity. Patient presented with a one week history of cough and fatigue that suddenly worsened the day of admission. He had been seen a few weeks prior to admission by Pulmonology and prescribed a prednisone pack which he completed, but he never got back to his baseline. Hypoxemic on arrival to 85% and currently on nasal cannula O2 at 2L.  Chest x-ray was concerning for early pneumonia. Patient given Solu-Medrol, albuterol, Atrovent and Levaquin and admitted for COPD exacerbation.  He was also diuresed with lasix 40mg  IV BID.  Ins and outs were not strictly recorded and his weight was  unchanged.  Despite no diureses, he had improvement in his dyspnea and tachycardia over two days and was able to ambulate the halls on 2L [place] without significant dyspnea on the date of discharge.    Discharge Diagnoses:  Principal Problem:   CAP (community acquired pneumonia) Active Problems:   Mild persistent asthma in adult without complication   OSA on CPAP   Acute respiratory failure (HCC)   Essential hypertension   Morbid (severe) obesity due to excess calories (HCC)   SVT (supraventricular tachycardia) (HCC)   Paroxysmal atrial fibrillation (HCC)   Atrial fibrillation ([place]) [I48.91]   Long term (current) use of anticoagulants [Z79.01]   Hx pulmonary embolism   DVT (deep venous thrombosis) (HCC)  Acute respiratory failure with hypoxemia likely secondary to asthma exacerbation, possible pneumonia and possible acute on chronic diastolic heart failure.His BNP is elevated and patient is up a few pounds. Last EF of 55-60% in March 2017 -Continue Levaquin for treatment of CAP -Continued home inhalers - Resumed torsemide - Prednisone taper with 10mg  daily until follow up with Pulmonology - Started spiriva -  Close follow up with pulmonology  Hypertension, BP mildly elevated -Continue home clonidine and losartan  Atrial fibrillation, generally a-fib with runs of SVT or RVR on telemetry, rate controlled - No BB due to asthma/COPD - Continued xarelto  History of pulmonary embolism and DVT -Continued Xarelto   Obstructive sleep apnea -Continued CPAP nightly  CKD stage III, stable  Morbid obesity Body mass index is 55.5 kg/m    12/27/2015  f/u ov/Lamona Eimer re: post hosp f/u transition of care  Chief Complaint  Patient presents with  . Hospitalization Follow-up    Breathing is "so so"- not back to baseline since hospital d/c. He is not wheezing and has very little cough.   typically wakes up 10 am then breo/ spiriva and no need for rescue at all  Has 02 to use only  as needed cpap x years but not clear under whose direction rec Plan A = Automatic =  BREO/ Spiriva daily on rising to start your day regardless of what time of the day it is  Plan B = Backup Only use your albuterol (proair)  Plan C = Crisis - only use your albuterol nebulizer if you first try Plan B and it fails to help > ok to use the nebulizer up to every 4 hours but if start needing it regularly call for immediate appointment Keep your appointment for pfts and follow up here same day      01/21/2016  f/u ov/Luiza Carranco re: asthma/ breo 100and no spiriva since 01/19/16  Has 02 not using  Chief Complaint  Patient presents with  . Follow-up    PFT's done  today. Breathing is unchanged. He has minimal non prod cough. He has not had to use albuterol inhaler or neb.   sleeping ok on new cpap machine from [facility]  - not limited by breathing from desired activities  But very sedentary/ last pred 01/21/2016  rec We need a download from your cpap machine and we will be referring you to a sleep specialist if needed  Stop Breo and start symbicort 160 Take 2 puffs first thing in am and then another 2 puffs about 12 hours later.     04/22/2016  f/u ov/Temprance Wyre re: asthma maint on symb 160 2bid / no saba Chief Complaint  Patient presents with  . Follow-up    He has had some minimal chest and nasal congestion recently. He has been coughing some, non prod. He has not needed albuterol inhaler or neb.    mild sense of throat and chest congestion on symb 160 2bid but sleeping fine on cpap s 02 and no noct/am flares rec GERD   Work on inhaler technique:    Plan A = Automatic = change symbicort to 80 Take 2 puffs first thing in am and then another 2 puffs about 12 hours later.  Plan B = Backup Only use your albuterol (proair) as a rescue medication   Plan C = Crisis - only use your albuterol nebulizer if you first try Plan B and it fails to help      07/21/2016  f/u ov/Jocelyn Lowery re: chronic asthma/ better on lower  dose symb since last ov / no need for saba hfa or neb  Chief Complaint  Patient presents with  . Follow-up    3 mo f/u for asthma. Denies any increased SOB or chest pains. Is still losing weight and is noticing his breathing is getting better.    No obvious day to day or daytime variability or assoc excess/ purulent sputum or mucus plugs or hemoptysis or cp or chest tightness, subjective wheeze or overt sinus or hb symptoms. No unusual exp hx or h/o childhood pna/ asthma or knowledge of premature birth.  Sleeping ok without nocturnal  or early am exacerbation  of respiratory  c/o's or need for noct saba. Also denies any obvious fluctuation of symptoms with weather or environmental changes or other aggravating or alleviating factors except as outlined above   Current Medications, Allergies, Complete Past Medical History, Past Surgical History, Family History, and Social History were reviewed in [employer] record.  ROS  The following are not active complaints unless bolded sore throat, dysphagia, dental problems, itching, sneezing,  nasal congestion or excess/ purulent secretions, ear ache,   fever, chills, sweats, unintended wt loss, classically pleuritic or exertional cp,  orthopnea pnd or leg swelling, presyncope, palpitations, abdominal pain, anorexia, nausea, vomiting, diarrhea  or change in bowel or bladder habits, change in stools or urine, dysuria,hematuria,  rash, arthralgias, visual complaints, headache, numbness, weakness or ataxia or problems with walking or coordination,  change in mood/affect or memory.                      Objective:   Physical Exam    massively obese bm nad still chewing mint gum      07/21/2016        362  04/22/2016        374  01/21/2016      390  12/27/2015        387   09/10/2015  372   07/24/15 389 lb 6.4 oz (176.631 kg)  07/08/15 384 lb 3.2 oz (174.272 kg)  06/21/15 390 lb 8 oz (177.13 kg)    Vital signs reviewed -  -  Note on arrival 02 sats  95% on RA     HEENT: nl dentition, turbinates, and oropharynx. Nl external ear canals without cough reflex   NECK :  without JVD/Nodes/TM/ nl carotid upstrokes bilaterally   LUNGS: no acc muscle use,  Nl contour chest which is clear to A and P bilaterally without cough or wheezing on insp or exp maneuvers   CV:  RRR  no s3 or murmur or increase in P2, no edema   ABD:  soft and nontender with nl inspiratory excursion in the supine position. No bruits or organomegaly, bowel sounds nl  MS:  Nl gait/ ext warm without deformities, calf tenderness, cyanosis or clubbing No obvious joint restrictions   SKIN: warm and dry without lesions    NEURO:  alert, approp, nl sensorium with  no motor deficits       CXR PA and Lateral:   01/21/2016 :    I personally reviewed images and agree with radiology impression as follows:   FINDINGS: Cardiomegaly with normal pulmonary vascularity. No focal infiltrate. Previously identified right upper lobe infiltrate is clear. No pleural effusion or pneumothorax. Bilateral apical pleural thickening noted most consistent with scarring. No acute bony abnormality .     Assessment:

## 2016-07-21 NOTE — Assessment & Plan Note (Signed)
ERV  01/21/2016  = 24%   Body mass index is 53.46 kg/m.  -  trending down/ encouraged  Lab Results  Component Value Date   TSH 1.536 02/04/2016     Contributing to gerd risk/ doe/reviewed the need and the process to achieve and maintain neg calorie balance > defer f/u primary care including intermittently monitoring thyroid status

## 2016-07-21 NOTE — Assessment & Plan Note (Signed)
singulair maint rx as of 09/10/2015 with NO = 31 / no saba or other controller needed  - admit 12/14/15 with flare ? Assoc with chf/ cap - PFT's  01/21/2016  FEV1 2.11 (71 % ) ratio 84  p 12 % improvement from saba p breo 100 and on last day of pred taper  prior to study with DLCO  73/82  % corrects to 120 % for alv volume  - 01/21/2016    change to symbicort  160 2bid  - FENO 04/22/2016  =   16 on symb 160 with sub optimal technique and singuair  - 04/22/2016    try symbicort 80 2bid   - 07/21/2016  After extensive coaching HFA effectiveness =    75%   All goals of chronic asthma control met including optimal function and elimination of symptoms with minimal need for rescue therapy.  Contingencies discussed in full including contacting this office immediately if not controlling the symptoms using the rule of two's.     Each maintenance medication was reviewed in detail including most importantly the difference between maintenance and as needed and under what circumstances the prns are to be used.  Please see AVS for specific  Instructions which are unique to this visit and I personally typed out  which were reviewed in detail in writing with the patient and a copy provided.    f/u can be q 6 m

## 2016-07-21 NOTE — Patient Instructions (Signed)
Work on inhaler technique:  relax and gently blow all the way out then take a nice smooth deep breath back in, triggering the inhaler at same time you start breathing in.  Hold for up to 5 seconds if you can. Blow out thru nose. Rinse and gargle with water when done     Please schedule a follow up visit in 6  months but call sooner if needed

## 2016-08-20 ENCOUNTER — Emergency Department (HOSPITAL_COMMUNITY)
Admission: EM | Admit: 2016-08-20 | Discharge: 2016-08-20 | Disposition: A | Discharge status: Home / Self Care | Payer: Managed Care, Other (non HMO) | Attending: Emergency Medicine | Admitting: Emergency Medicine

## 2016-08-20 ENCOUNTER — Emergency Department (HOSPITAL_COMMUNITY): Payer: Managed Care, Other (non HMO)

## 2016-08-20 DIAGNOSIS — I110 Hypertensive heart disease with heart failure: Secondary | ICD-10-CM | POA: Diagnosis not present

## 2016-08-20 DIAGNOSIS — Z79899 Other long term (current) drug therapy: Secondary | ICD-10-CM | POA: Insufficient documentation

## 2016-08-20 DIAGNOSIS — R0602 Shortness of breath: Secondary | ICD-10-CM

## 2016-08-20 DIAGNOSIS — Z87891 Personal history of nicotine dependence: Secondary | ICD-10-CM | POA: Diagnosis not present

## 2016-08-20 DIAGNOSIS — I130 Hypertensive heart and chronic kidney disease with heart failure and stage 1 through stage 4 chronic kidney disease, or unspecified chronic kidney disease: Secondary | ICD-10-CM | POA: Diagnosis not present

## 2016-08-20 DIAGNOSIS — I5033 Acute on chronic diastolic (congestive) heart failure: Secondary | ICD-10-CM | POA: Insufficient documentation

## 2016-08-20 DIAGNOSIS — Z8679 Personal history of other diseases of the circulatory system: Secondary | ICD-10-CM

## 2016-08-20 DIAGNOSIS — J45909 Unspecified asthma, uncomplicated: Secondary | ICD-10-CM | POA: Insufficient documentation

## 2016-08-20 DIAGNOSIS — N183 Chronic kidney disease, stage 3 (moderate): Secondary | ICD-10-CM | POA: Insufficient documentation

## 2016-08-20 LAB — POCT I-STAT, CHEM 8
BUN: 32 mg/dL — AB (ref 6–20)
CALCIUM ION: 1.15 mmol/L (ref 1.15–1.40)
Chloride: 101 mmol/L (ref 101–111)
Creatinine, Ser: 2 mg/dL — ABNORMAL HIGH (ref 0.61–1.24)
Glucose, Bld: 100 mg/dL — ABNORMAL HIGH (ref 65–99)
HEMATOCRIT: 39 % (ref 39.0–52.0)
HEMOGLOBIN: 13.3 g/dL (ref 13.0–17.0)
Potassium: 3.2 mmol/L — ABNORMAL LOW (ref 3.5–5.1)
SODIUM: 143 mmol/L (ref 135–145)
TCO2: 31 mmol/L (ref 0–100)

## 2016-08-20 MED ORDER — FUROSEMIDE 10 MG/ML IJ SOLN
60.0000 mg | Freq: Once | INTRAMUSCULAR | Status: AC
  Administered 2016-08-20: 60 mg via INTRAMUSCULAR
  Filled 2016-08-20: qty 8

## 2016-08-20 MED ORDER — ALBUTEROL SULFATE (2.5 MG/3ML) 0.083% IN NEBU
5.0000 mg | INHALATION_SOLUTION | Freq: Once | RESPIRATORY_TRACT | Status: AC
  Administered 2016-08-20: 5 mg via RESPIRATORY_TRACT
  Filled 2016-08-20: qty 6

## 2016-08-20 MED ORDER — DEXAMETHASONE SODIUM PHOSPHATE 10 MG/ML IJ SOLN
10.0000 mg | Freq: Once | INTRAMUSCULAR | Status: AC
  Administered 2016-08-20: 10 mg via INTRAMUSCULAR
  Filled 2016-08-20: qty 1

## 2016-08-20 NOTE — ED Provider Notes (Signed)
[facility] DEPT Provider Note   CSN: [id number] Arrival date & time: 08/20/16  0149  By signing my name below, I, Collene Leyden, attest that this documentation has been prepared under the direction and in the presence of Kamyrah Feeser, Barbette Hair, MD. Electronically Signed: Collene Leyden, Scribe. 08/20/16. 4:06 AM.  History   Chief Complaint Chief Complaint  Patient presents with  . Shortness of Breath   HPI Comments: Phillip [name] is a 56 y.o. male with a history of congestive heart failure (last echo revealed an EF of 40-45%), HTN, OSA on CPAP, PAF, and PE on xarelto, who presents to the Emergency Department complaining intermittent shortness of breath that began yesterday. Patient states his shortness of breath is worse on exertion. Patient reports an associated cough, and LE edema. Patient reports using his Symbicort inhaler daily with some relief. Reports "I think that this is mild heart failure." Denies chest pain. Patient reports taking his torsemide as prescribed. Patient denies any fever, chills, nausea, vomiting, abdominal pain, diaphoresis, or any additional symptoms.   The history is provided by the patient. No language interpreter was used.    Past Medical History:  Diagnosis Date  . CHF (congestive heart failure) ([place]) 11/15   EF 40-45%, improved with follow-up  . Gout   . Hypertension   . Hypertensive cardiovascular disease   . Morbid obesity ([place])   . Obstructive sleep apnea    complaint with CPAP  . OSA on CPAP   . Paroxysmal atrial fibrillation (H[facility])   . PNA (pneumonia)   . Pulmonary embolism [facility])     Patient Active Problem List   Diagnosis Date Noted  . Acute on chronic diastolic CHF (congestive heart failure) ([facility])   . Long term (current) use of anticoagulants [Z79.01] 08/17/2015  . Hx pulmonary embolism 08/17/2015  . Deep vein thrombosis (DVT) of lower extremity ([place]) 08/17/2015  . Paroxysmal atrial fibrillation ([place]) 08/04/2015  . HCAP  (healthcare-associated pneumonia) 07/04/2015  . SVT (supraventricular tachycardia) ([place]) 06/22/2015  . Acute on chronic respiratory failure with hypoxia ([place]) 06/21/2015  . Morbid (severe) obesity due to excess calories ([place]) 03/07/2015  . Acute respiratory failure ([place]) 02/20/2015  . Elevated troponin 02/20/2015  . Essential hypertension   . Gout   . CAP (community acquired pneumonia) 05/07/2014  . Mild persistent asthma in adult without complication 45/80/9983  . OSA on CPAP 05/06/2014  . Hypoxia 05/06/2014  . Chronic kidney disease, stage 3 05/06/2014  . Asthma with acute exacerbation 05/06/2014    Past Surgical History:  Procedure Laterality Date  . UMBILICAL HERNIA REPAIR  1992       Home Medications    Prior to Admission medications   Medication Sig Start Date End Date Taking? Authorizing Provider  albuterol (PROVENTIL HFA;VENTOLIN HFA) 108 (90 Base) MCG/ACT inhaler Inhale 2 puffs into the lungs every 4 (four) hours as needed for wheezing or shortness of breath.    [provider]  albuterol (PROVENTIL) (2.5 MG/3ML) 0.083% nebulizer solution Take 3 mLs (2.5 mg total) by nebulization every 4 (four) hours as needed for wheezing or shortness of breath. 07/08/15   Kelvin Cellar, MD  allopurinol (ZYLOPRIM) 300 MG tablet Take 1 tablet (300 mg total) by mouth daily. 07/07/16   Edrick Kins, DPM  budesonide-formoterol (SYMBICORT) 80-4.5 MCG/ACT inhaler Inhale 2 puffs into the lungs 2 (two) times daily. 04/22/16   Tanda Rockers, MD  budesonide-formoterol (SYMBICORT) 80-4.5 MCG/ACT inhaler Inhale 2 puffs into the lungs 2 (two) times daily. 07/21/16  Tanda Rockers, MD  cloNIDine (CATAPRES) 0.1 MG tablet Take 1 tablet (0.1 mg total) by mouth 3 (three) times daily. 12/16/15   Janece Canterbury, MD  colchicine 0.6 MG tablet Take 1 tablet (0.6 mg total) by mouth daily. 07/07/16   Edrick Kins, DPM  hydrALAZINE (APRESOLINE) 50 MG tablet Take 50 mg by mouth 2 (two) times daily.     [provider]  HYDROcodone-acetaminophen (NORCO/VICODIN) 5-325 MG tablet Take 1 tablet by mouth every 4 (four) hours as needed. 06/21/16   Muthersbaugh, Jarrett Soho, PA-C  montelukast (SINGULAIR) 10 MG tablet Take 10 mg by mouth at bedtime.    [provider]  NONFORMULARY OR COMPOUNDED ITEM Apply 1 drop topically daily. Nail Lacquer: Fluconazole 2% Terbinafine 1% DMSO 05/14/16   Evans, Dorathy Daft, DPM  OXYGEN Inhale 2 L into the lungs daily. 2lpm when needed per pt     [provider]  rivaroxaban (XARELTO) 20 MG TABS tablet Take 1 tablet (20 mg total) by mouth daily with supper. 09/07/15   Allred, Jeneen Rinks, MD  terbinafine (LAMISIL) 250 MG tablet Take 1 tablet (250 mg total) by mouth daily. 05/14/16   Edrick Kins, DPM  torsemide (DEMADEX) 20 MG tablet Take 1 tablet (20 mg total) by mouth 2 (two) times daily. 04/29/16   Isaiah Serge, NP  UNABLE TO FIND Med Name: CPAP with sleep    [provider]    Family History Family History  Problem Relation Age of Onset  . Diabetes Father   . CAD Father   . Hypertension Other   . Diabetes Other   . Allergies Brother     Social History Social History  Substance Use Topics  . Smoking status: Former Smoker    Packs/day: 0.25    Years: 1.00    Types: Cigarettes    Quit date: 07/23/1989  . Smokeless tobacco: Never Used  . Alcohol use No     Allergies   Patient has no known allergies.   Review of Systems Review of Systems  Constitutional: Negative for chills, diaphoresis and fever.  Respiratory: Positive for shortness of breath.   Cardiovascular: Positive for leg swelling. Negative for chest pain.  Gastrointestinal: Negative for abdominal pain, diarrhea, nausea and vomiting.  All other systems reviewed and are negative.    Physical Exam Updated Vital Signs BP (!) 145/79 (BP Location: Left Arm)   Pulse 83   Temp 98.5 F (36.9 C) (Oral)   Resp 20   SpO2 96%   Physical Exam  Constitutional: He is  oriented to person, place, and time. He appears well-developed and well-nourished.  Morbidly obese  HENT:  Head: Normocephalic and atraumatic.  Cardiovascular: Normal rate, regular rhythm and normal heart sounds.   No murmur heard. Pulmonary/Chest: Effort normal. No respiratory distress. He has wheezes.  Scant expiratory wheeze  Abdominal: Soft. There is no tenderness.  Musculoskeletal: He exhibits edema.  1+ bilateral lower extremity edema to the ankles  Neurological: He is alert and oriented to person, place, and time.  Skin: Skin is warm and dry.  Psychiatric: He has a normal mood and affect.  Nursing note and vitals reviewed.    ED Treatments / Results  DIAGNOSTIC STUDIES: Oxygen Saturation is 96% on RA, normal by my interpretation.    COORDINATION OF CARE: 3:28 AM Discussed treatment plan with pt at bedside and pt agreed to plan, which includes lasix and steroids.   Labs (all labs ordered are listed, but only abnormal results  are displayed) Labs Reviewed  POCT I-STAT, CHEM 8 - Abnormal; Notable for the following:       Result Value   Potassium 3.2 (*)    BUN 32 (*)    Creatinine, Ser 2.00 (*)    Glucose, Bld 100 (*)    All other components within normal limits  I-STAT CHEM 8, ED    EKG  EKG Interpretation  Date/Time:  Wednesday Aug 20 2016 02:02:20 EDT Ventricular Rate:  78 PR Interval:    QRS Duration: 124 QT Interval:  460 QTC Calculation: 524 R Axis:   -37 Text Interpretation:  Atrial flutter/fibrillation LVH with IVCD, LAD and secondary repol abnrm Borderline ST elevation, lateral leads Prolonged QT interval Confirmed by Thayer Jew [phone]) on 08/20/2016 3:11:02 AM       Radiology Dg Chest 2 View  Result Date: 08/20/2016 CLINICAL DATA:  56 year old male with shortness of breath. EXAM: CHEST  2 VIEW COMPARISON:  Chest radiograph dated 06/02/2016 FINDINGS: The lungs are clear. There is no pleural effusion or pneumothorax. There is stable mild  cardiomegaly. No vascular congestion or edema. There is mild tortuosity of the thoracic aorta. No acute osseous pathology. IMPRESSION: 1. No acute cardiopulmonary process. 2. Stable mild cardiomegaly.  No vascular congestion or edema. Electronically Signed   By: Anner Crete M.D.   On: 08/20/2016 02:45    Procedures Procedures (including critical care time)  Medications Ordered in ED Medications  albuterol (PROVENTIL) (2.5 MG/3ML) 0.083% nebulizer solution 5 mg (5 mg Nebulization Given 08/20/16 0248)  dexamethasone (DECADRON) injection 10 mg (10 mg Intramuscular Given 08/20/16 0356)  furosemide (LASIX) injection 60 mg (60 mg Intramuscular Given 08/20/16 0357)     Initial Impression / Assessment and Plan / ED Course  I have reviewed the triage vital signs and the nursing notes.  Pertinent labs & imaging results that were available during my care of the patient were reviewed by me and considered in my medical decision making (see chart for details).     Patient presents with shortness of breath. History of asthma and heart failure. Reports that he feels like this is increased fluid. He is nontoxic on exam. Satting 96%. Only scant wheezing. Also reports some improvement with inhalers. EKG shows atrial flutter. Patient has a history of same and is on Xarelto. He is rate controlled. He is mildly volume overloaded on exam. Chest x-ray does not show any overt volume overload. Suspect mild CHF plus or minus a component of asthma given wheeze. CMP is at the patient's baseline. Patient was given one dose of IM Lasix and a dose of Decadron to cover for reactive airways. He ambulated and maintain his pulse ox without difficulty. Continue to monitor closely at home. Follow-up with primary physician.  After history, exam, and medical workup I feel the patient has been appropriately medically screened and is safe for discharge home. Pertinent diagnoses were discussed with the patient. Patient was given  return precautions.   Final Clinical Impressions(s) / ED Diagnoses   Final diagnoses:  Shortness of breath  History of congestive heart failure    New Prescriptions New Prescriptions   No medications on file   I personally performed the services described in this documentation, which was scribed in my presence. The recorded information has been reviewed and is accurate.    Merryl Hacker, MD 08/20/16 [phone]

## 2016-08-20 NOTE — ED Triage Notes (Addendum)
Pt c/o SOB, chest tightness, and wheezing for since yesterday. Pt had breathing treatment and inhaler, didn't help much. Intolerant of activity.

## 2016-08-20 NOTE — Discharge Instructions (Signed)
You were seen today for shortness of breath. This is likely mild CHF.  You were given one dose of diuretic medication. Monitor her symptoms closely at home. If you develop increasing shortness of breath, chest pain or any new or worsening symptoms you need to be reevaluated.

## 2016-08-20 NOTE — ED Notes (Signed)
ED Provider at bedside. 

## 2016-09-10 ENCOUNTER — Other Ambulatory Visit: Payer: Self-pay | Admitting: Internal Medicine

## 2016-09-10 NOTE — Telephone Encounter (Signed)
Age 19yrs Wt 164.2kg (07/21/2016) Hgb 11.4 HCT 36.1 (06/02/2016) SrCr 2.0 (08/20/2016) CrCl 98.06  Refill done on Xarelto 20 mg daily as requested

## 2016-09-12 ENCOUNTER — Emergency Department (HOSPITAL_COMMUNITY): Payer: Managed Care, Other (non HMO)

## 2016-09-12 ENCOUNTER — Telehealth: Payer: Self-pay | Admitting: Internal Medicine

## 2016-09-12 ENCOUNTER — Encounter (HOSPITAL_COMMUNITY): Payer: Self-pay

## 2016-09-12 ENCOUNTER — Emergency Department (HOSPITAL_COMMUNITY)
Admission: EM | Admit: 2016-09-12 | Discharge: 2016-09-12 | Disposition: A | Discharge status: Home / Self Care | Payer: Managed Care, Other (non HMO) | Attending: Emergency Medicine | Admitting: Emergency Medicine

## 2016-09-12 ENCOUNTER — Other Ambulatory Visit: Payer: Self-pay | Admitting: Internal Medicine

## 2016-09-12 DIAGNOSIS — J45909 Unspecified asthma, uncomplicated: Secondary | ICD-10-CM | POA: Insufficient documentation

## 2016-09-12 DIAGNOSIS — R05 Cough: Secondary | ICD-10-CM | POA: Insufficient documentation

## 2016-09-12 DIAGNOSIS — R0989 Other specified symptoms and signs involving the circulatory and respiratory systems: Secondary | ICD-10-CM | POA: Diagnosis present

## 2016-09-12 DIAGNOSIS — R0602 Shortness of breath: Secondary | ICD-10-CM | POA: Diagnosis not present

## 2016-09-12 DIAGNOSIS — Z5321 Procedure and treatment not carried out due to patient leaving prior to being seen by health care provider: Secondary | ICD-10-CM | POA: Insufficient documentation

## 2016-09-12 MED ORDER — ALBUTEROL SULFATE (2.5 MG/3ML) 0.083% IN NEBU: 2.5000 mg | INHALATION_SOLUTION | Freq: Four times a day (QID) | RESPIRATORY_TRACT | 11 refills | Status: DC | PRN

## 2016-09-12 MED ORDER — ALBUTEROL SULFATE (2.5 MG/3ML) 0.083% IN NEBU
5.0000 mg | INHALATION_SOLUTION | Freq: Once | RESPIRATORY_TRACT | Status: DC
  Filled 2016-09-12: qty 6

## 2016-09-12 NOTE — ED Triage Notes (Signed)
Pt c/o chest congestion and SOB with activity since Tuesday. Pt states that he is coughing up yellow sputum. He denies hemoptysis or chest pain. A&Ox4. Ambulatory. Hx of asthma.

## 2016-09-12 NOTE — Telephone Encounter (Signed)
Pt requesting asap refill on albuterol neb.  This has been sent to preferred pharmacy.  Nothing further needed.

## 2016-09-12 NOTE — ED Notes (Signed)
Pt states that he is feeling better and is going to get his nebulizer meds from his primary physician.

## 2016-09-15 ENCOUNTER — Encounter (HOSPITAL_COMMUNITY): Payer: Self-pay | Admitting: Emergency Medicine

## 2016-09-15 ENCOUNTER — Other Ambulatory Visit: Payer: Self-pay

## 2016-09-15 ENCOUNTER — Emergency Department (HOSPITAL_COMMUNITY): Payer: Managed Care, Other (non HMO)

## 2016-09-15 ENCOUNTER — Emergency Department (HOSPITAL_COMMUNITY)
Admission: EM | Admit: 2016-09-15 | Discharge: 2016-09-15 | Disposition: A | Discharge status: Home / Self Care | Payer: Managed Care, Other (non HMO) | Attending: Emergency Medicine | Admitting: Emergency Medicine

## 2016-09-15 DIAGNOSIS — Z87891 Personal history of nicotine dependence: Secondary | ICD-10-CM | POA: Insufficient documentation

## 2016-09-15 DIAGNOSIS — N183 Chronic kidney disease, stage 3 (moderate): Secondary | ICD-10-CM | POA: Insufficient documentation

## 2016-09-15 DIAGNOSIS — R0602 Shortness of breath: Secondary | ICD-10-CM | POA: Diagnosis present

## 2016-09-15 DIAGNOSIS — J45909 Unspecified asthma, uncomplicated: Secondary | ICD-10-CM | POA: Diagnosis not present

## 2016-09-15 DIAGNOSIS — I130 Hypertensive heart and chronic kidney disease with heart failure and stage 1 through stage 4 chronic kidney disease, or unspecified chronic kidney disease: Secondary | ICD-10-CM | POA: Diagnosis not present

## 2016-09-15 DIAGNOSIS — I5023 Acute on chronic systolic (congestive) heart failure: Secondary | ICD-10-CM | POA: Insufficient documentation

## 2016-09-15 LAB — BASIC METABOLIC PANEL
ANION GAP: 6 (ref 5–15)
BUN: 25 mg/dL — ABNORMAL HIGH (ref 6–20)
CHLORIDE: 106 mmol/L (ref 101–111)
CO2: 31 mmol/L (ref 22–32)
CREATININE: 1.81 mg/dL — AB (ref 0.61–1.24)
Calcium: 9.1 mg/dL (ref 8.9–10.3)
GFR calc non Af Amer: 41 mL/min — ABNORMAL LOW (ref >60)
GFR, EST AFRICAN AMERICAN: 47 mL/min — AB (ref >60)
Glucose, Bld: 104 mg/dL — ABNORMAL HIGH (ref 65–99)
POTASSIUM: 3.3 mmol/L — AB (ref 3.5–5.1)
SODIUM: 143 mmol/L (ref 135–145)

## 2016-09-15 LAB — BRAIN NATRIURETIC PEPTIDE: B NATRIURETIC PEPTIDE 5: 868.5 pg/mL — AB (ref 0.0–100.0)

## 2016-09-15 LAB — CBC
HEMATOCRIT: 36.1 % — AB (ref 39.0–52.0)
Hemoglobin: 11.5 g/dL — ABNORMAL LOW (ref 13.0–17.0)
MCH: 26.7 pg (ref 26.0–34.0)
MCHC: 31.9 g/dL (ref 30.0–36.0)
MCV: 84 fL (ref 78.0–100.0)
PLATELETS: 182 10*3/uL (ref 150–400)
RBC: 4.3 MIL/uL (ref 4.22–5.81)
RDW: 15.7 % — ABNORMAL HIGH (ref 11.5–15.5)
WBC: 4.2 10*3/uL (ref 4.0–10.5)

## 2016-09-15 LAB — I-STAT TROPONIN, ED: Troponin i, poc: 0.05 ng/mL (ref 0.00–0.08)

## 2016-09-15 MED ORDER — FUROSEMIDE 40 MG PO TABS
60.0000 mg | ORAL_TABLET | Freq: Once | ORAL | Status: AC
Start: 2016-09-15 — End: 2016-09-15
  Administered 2016-09-15: 60 mg via ORAL
  Filled 2016-09-15: qty 2

## 2016-09-15 MED ORDER — ALBUTEROL SULFATE (2.5 MG/3ML) 0.083% IN NEBU
5.0000 mg | INHALATION_SOLUTION | Freq: Once | RESPIRATORY_TRACT | Status: AC
  Administered 2016-09-15: 5 mg via RESPIRATORY_TRACT
  Filled 2016-09-15: qty 6

## 2016-09-15 NOTE — ED Triage Notes (Signed)
Patient complaining of chest pain and tightness. Patient states he used he inhalers at home but is still having difficulty breathing. Patient stated that he is having SOB.

## 2016-09-15 NOTE — ED Notes (Addendum)
Pt is alert and oriented x 4 and is verbally responsive. Pt is c/o chest tightness to mid sternal chest. Pt states that the pain started 1 week ago and has a productive cough with yellow phlegm.

## 2016-09-15 NOTE — Discharge Instructions (Signed)
Double your diuretic dose over the next 2 days.   Call your primary cardiologist for follow-up.[name]   Please weigh yourself daily and record these weights for review by your cardiologist

## 2016-09-15 NOTE — ED Notes (Signed)
Patient transported to X-ray 

## 2016-09-17 ENCOUNTER — Ambulatory Visit (INDEPENDENT_AMBULATORY_CARE_PROVIDER_SITE_OTHER): Payer: Managed Care, Other (non HMO) | Admitting: Podiatry

## 2016-09-17 DIAGNOSIS — M109 Gout, unspecified: Secondary | ICD-10-CM

## 2016-09-17 NOTE — ED Provider Notes (Signed)
[facility] DEPT Provider Note   CSN: [id number] Arrival date & time: 09/15/16  9892     History   Chief Complaint Chief Complaint  Patient presents with  . Shortness of Breath    HPI Phillip [name] is a 56 y.o. male. Patient reports productive cough and congestion with some mild shortness breath since Tuesday.  No hemoptysis.  He does report a history of asthma.  He's tried his home MDI without improvement.  No fevers or chills.  No unilateral leg swelling.  No history DVT or pulmonary embolism.  Symptoms are mild to moderate in severity.  Denies orthopnea.   The history is provided by the patient and medical records.    Past Medical History:  Diagnosis Date  . CHF (congestive heart failure) ([place]) 11/15   EF 40-45%, improved with follow-up  . Gout   . Hypertension   . Hypertensive cardiovascular disease   . Morbid obesity ([place])   . Obstructive sleep apnea    complaint with CPAP  . OSA on CPAP   . Paroxysmal atrial fibrillation (H[facility])   . PNA (pneumonia)   . Pulmonary embolism [facility])     Patient Active Problem List   Diagnosis Date Noted  . Acute on chronic diastolic CHF (congestive heart failure) ([place])   . Long term (current) use of anticoagulants [Z79.01] 08/17/2015  . Hx pulmonary embolism 08/17/2015  . Deep vein thrombosis (DVT) of lower extremity ([place]) 08/17/2015  . Paroxysmal atrial fibrillation ([place]) 08/04/2015  . HCAP (healthcare-associated pneumonia) 07/04/2015  . SVT (supraventricular tachycardia) ([place]) 06/22/2015  . Acute on chronic respiratory failure with hypoxia ([place]) 06/21/2015  . Morbid (severe) obesity due to excess calories ([place]) 03/07/2015  . Acute respiratory failure ([place]) 02/20/2015  . Elevated troponin 02/20/2015  . Essential hypertension   . Gout   . CAP (community acquired pneumonia) 05/07/2014  . Mild persistent asthma in adult without complication 11/94/1740  . OSA on CPAP 05/06/2014  . Hypoxia 05/06/2014  . Chronic kidney disease,  stage 3 05/06/2014  . Asthma with acute exacerbation 05/06/2014    Past Surgical History:  Procedure Laterality Date  . UMBILICAL HERNIA REPAIR  1992       Home Medications    Prior to Admission medications   Medication Sig Start Date End Date Taking? Authorizing Provider  albuterol (PROVENTIL HFA;VENTOLIN HFA) 108 (90 Base) MCG/ACT inhaler Inhale 2 puffs into the lungs every 4 (four) hours as needed for wheezing or shortness of breath.    [provider]  albuterol (PROVENTIL) (2.5 MG/3ML) 0.083% nebulizer solution TAKE 3 ML'S BY NEBULIZATION EVERY 4 HOURS AS NEEDED FOR WHEEZING OR SHORTNESS OF BREATH 09/12/16   Tanda Rockers, MD  albuterol (PROVENTIL) (2.5 MG/3ML) 0.083% nebulizer solution Take 3 mLs (2.5 mg total) by nebulization every 6 (six) hours as needed for wheezing or shortness of breath. 09/12/16   Tanda Rockers, MD  allopurinol (ZYLOPRIM) 300 MG tablet Take 1 tablet (300 mg total) by mouth daily. 07/07/16   Edrick Kins, DPM  budesonide-formoterol (SYMBICORT) 80-4.5 MCG/ACT inhaler Inhale 2 puffs into the lungs 2 (two) times daily. 04/22/16   Tanda Rockers, MD  budesonide-formoterol (SYMBICORT) 80-4.5 MCG/ACT inhaler Inhale 2 puffs into the lungs 2 (two) times daily. 07/21/16   Tanda Rockers, MD  cloNIDine (CATAPRES) 0.1 MG tablet Take 1 tablet (0.1 mg total) by mouth 3 (three) times daily. 12/16/15   Janece Canterbury, MD  colchicine 0.6 MG tablet Take 1 tablet (0.6 mg total)  by mouth daily. 07/07/16   Edrick Kins, DPM  hydrALAZINE (APRESOLINE) 50 MG tablet Take 50 mg by mouth 2 (two) times daily.    [provider]  HYDROcodone-acetaminophen (NORCO/VICODIN) 5-325 MG tablet Take 1 tablet by mouth every 4 (four) hours as needed. 06/21/16   Muthersbaugh, Jarrett Soho, PA-C  montelukast (SINGULAIR) 10 MG tablet Take 10 mg by mouth at bedtime.    [provider]  NONFORMULARY OR COMPOUNDED ITEM Apply 1 drop topically daily. Nail Lacquer: Fluconazole 2%  Terbinafine 1% DMSO 05/14/16   Evans, Dorathy Daft, DPM  OXYGEN Inhale 2 L into the lungs daily. 2lpm when needed per pt     [provider]  terbinafine (LAMISIL) 250 MG tablet Take 1 tablet (250 mg total) by mouth daily. 05/14/16   Edrick Kins, DPM  torsemide (DEMADEX) 20 MG tablet Take 1 tablet (20 mg total) by mouth 2 (two) times daily. 04/29/16   Isaiah Serge, NP  UNABLE TO FIND Med Name: CPAP with sleep    [provider]  XARELTO 20 MG TABS tablet TAKE 1 TABLET BY MOUTH DAILY WITH SUPPER 09/10/16   Belva Crome, MD    Family History Family History  Problem Relation Age of Onset  . Diabetes Father   . CAD Father   . Hypertension Other   . Diabetes Other   . Allergies Brother     Social History Social History  Substance Use Topics  . Smoking status: Former Smoker    Packs/day: 0.25    Years: 1.00    Types: Cigarettes    Quit date: 07/23/1989  . Smokeless tobacco: Never Used  . Alcohol use No     Allergies   Patient has no known allergies.   Review of Systems Review of Systems  All other systems reviewed and are negative.    Physical Exam Updated Vital Signs BP (!) 160/79 (BP Location: Left Arm)   Pulse 75   Temp 98.2 F (36.8 C) (Oral)   Resp 18   Ht 5' 9.5" (1.765 m)   Wt (!) 166.9 kg (368 lb)   SpO2 97%   BMI 53.56 kg/m   Physical Exam  Constitutional: He is oriented to person, place, and time. He appears well-developed and well-nourished.  Morbidly obese  HENT:  Head: Normocephalic and atraumatic.  Eyes: EOM are normal.  Neck: Normal range of motion.  Cardiovascular: Normal rate, regular rhythm, normal heart sounds and intact distal pulses.   Pulmonary/Chest: Effort normal and breath sounds normal. No respiratory distress.  Abdominal: Soft. He exhibits no distension. There is no tenderness.  Musculoskeletal: Normal range of motion.  Neurological: He is alert and oriented to person, place, and time.  Skin: Skin is warm and dry.    Psychiatric: He has a normal mood and affect. Judgment normal.  Nursing note and vitals reviewed.    ED Treatments / Results  Labs (all labs ordered are listed, but only abnormal results are displayed) Labs Reviewed  BASIC METABOLIC PANEL - Abnormal; Notable for the following:       Result Value   Potassium 3.3 (*)    Glucose, Bld 104 (*)    BUN 25 (*)    Creatinine, Ser 1.81 (*)    GFR calc non Af Amer 41 (*)    GFR calc Af Amer 47 (*)    All other components within normal limits  CBC - Abnormal; Notable for the following:    Hemoglobin 11.5 (*)  HCT 36.1 (*)    RDW 15.7 (*)    All other components within normal limits  BRAIN NATRIURETIC PEPTIDE - Abnormal; Notable for the following:    B Natriuretic Peptide 868.5 (*)    All other components within normal limits  I-STAT TROPOININ, ED    EKG  EKG Interpretation  Date/Time:  Monday September 15 2016 04:55:14 EDT Ventricular Rate:  73 PR Interval:    QRS Duration: 94 QT Interval:  449 QTC Calculation: 495 R Axis:   3 Text Interpretation:  Sinus rhythm Borderline T abnormalities, lateral leads Borderline prolonged QT interval No significant change was found Confirmed by Jola Schmidt [phone]) on 09/15/2016 5:27:55 AM Also confirmed by Jola Schmidt [phone]), editor Drema Pry [phone])  on 09/15/2016 6:55:25 AM       Radiology Dg Chest 2 View  Result Date: 09/15/2016 CLINICAL DATA:  Worsening shortness of breath, chest tightness for week. History CHF, COPD, hypertension. EXAM: CHEST  2 VIEW COMPARISON:  Chest radiograph September 12, 2016 FINDINGS: Stable cardiomegaly. Pulmonary vascular congestion without pleural effusion or focal consolidation. Mediastinal silhouette is nonsuspicious, no pneumothorax. Soft tissue planes and included osseous structures are nonsuspicious given large body habitus. IMPRESSION: Stable cardiomegaly, increasing pulmonary vascular congestion. Electronically Signed   By: Elon Alas M.D.    On: 09/15/2016 05:39    Procedures Procedures (including critical care time)  Medications Ordered in ED Medications  albuterol (PROVENTIL) (2.5 MG/3ML) 0.083% nebulizer solution 5 mg (5 mg Nebulization Given 09/15/16 0518)  furosemide (LASIX) tablet 60 mg (60 mg Oral Given 09/15/16 0817)     Initial Impression / Assessment and Plan / ED Course  I have reviewed the triage vital signs and the nursing notes.  Pertinent labs & imaging results that were available during my care of the patient were reviewed by me and considered in my medical decision making (see chart for details).     Patient received bronchodilators in the emergency department and feels much better at this time.there is some degree of congestive heart failure as well.  Lasix now.  Home with Lasix.  Primary care and cardiology follow-up.  Patient understands to return to the ER for new or worsening symptoms    Final Clinical Impressions(s) / ED Diagnoses   Final diagnoses:  Acute on chronic systolic congestive heart failure [facility])    New Prescriptions Discharge Medication List as of 09/15/2016  7:12 AM       Jola Schmidt, MD 09/17/16 [phone]

## 2016-09-18 NOTE — Progress Notes (Signed)
   Subjective: Patient is a 56 year old male presenting today for follow-up evaluation of an acute gout attack of the left first toe. He states his pain has improved and his family under control. He denies any new complaints at this time.    Objective/Physical Exam General: The patient is alert and oriented x3 in no acute distress.  Dermatology: Skin is warm, dry and supple bilateral lower extremities. Negative for open lesions or macerations.  Vascular: Palpable pedal pulses bilaterally. No edema or erythema noted. Capillary refill within normal limits.  Neurological: Epicritic and protective threshold grossly intact bilaterally.   Musculoskeletal Exam: Range of motion within normal limits to all pedal and ankle joints bilateral. Muscle strength 5/5 in all groups bilateral.    Assessment: #1 acute gout left first MPJ-resolved   Plan of Care:  #1 Patient was evaluated. #2 continue taking allopurinol 300 mg managed by PCP. #3 discontinue Colcrys 0.6 mg. #4 return to clinic when necessary.  Edrick Kins, DPM [facility]  Dr. Edrick Kins, [place]                                        [address], [place] [address]                Office [phone]  Fax [phone]

## 2016-09-19 ENCOUNTER — Ambulatory Visit: Payer: Self-pay | Admitting: Internal Medicine

## 2016-09-30 ENCOUNTER — Other Ambulatory Visit (INDEPENDENT_AMBULATORY_CARE_PROVIDER_SITE_OTHER): Payer: Managed Care, Other (non HMO)

## 2016-09-30 ENCOUNTER — Ambulatory Visit (INDEPENDENT_AMBULATORY_CARE_PROVIDER_SITE_OTHER)
Admission: RE | Admit: 2016-09-30 | Discharge: 2016-09-30 | Disposition: A | Discharge status: Home / Self Care | Payer: Managed Care, Other (non HMO) | Source: Ambulatory Visit | Attending: Internal Medicine | Admitting: Internal Medicine

## 2016-09-30 ENCOUNTER — Ambulatory Visit (INDEPENDENT_AMBULATORY_CARE_PROVIDER_SITE_OTHER): Payer: Managed Care, Other (non HMO) | Admitting: Internal Medicine

## 2016-09-30 ENCOUNTER — Encounter: Payer: Self-pay | Admitting: Internal Medicine

## 2016-09-30 VITALS — BP 130/84 | HR 88 | Ht 69.5 in | Wt 358.4 lb

## 2016-09-30 DIAGNOSIS — R0609 Other forms of dyspnea: Secondary | ICD-10-CM

## 2016-09-30 DIAGNOSIS — J4530 Mild persistent asthma, uncomplicated: Secondary | ICD-10-CM

## 2016-09-30 LAB — CBC WITH DIFFERENTIAL/PLATELET
BASOS PCT: 1.2 % (ref 0.0–3.0)
Basophils Absolute: 0.1 10*3/uL (ref 0.0–0.1)
EOS PCT: 0.6 % (ref 0.0–5.0)
Eosinophils Absolute: 0 10*3/uL (ref 0.0–0.7)
HCT: 38.8 % — ABNORMAL LOW (ref 39.0–52.0)
HEMOGLOBIN: 12.5 g/dL — AB (ref 13.0–17.0)
LYMPHS ABS: 1.4 10*3/uL (ref 0.7–4.0)
Lymphocytes Relative: 26.3 % (ref 12.0–46.0)
MCHC: 32.3 g/dL (ref 30.0–36.0)
MCV: 84.8 fl (ref 78.0–100.0)
MONO ABS: 0.4 10*3/uL (ref 0.1–1.0)
Monocytes Relative: 7.6 % (ref 3.0–12.0)
NEUTROS PCT: 64.3 % (ref 43.0–77.0)
Neutro Abs: 3.4 10*3/uL (ref 1.4–7.7)
Platelets: 164 10*3/uL (ref 150.0–400.0)
RBC: 4.57 Mil/uL (ref 4.22–5.81)
RDW: 17 % — AB (ref 11.5–15.5)
WBC: 5.3 10*3/uL (ref 4.0–10.5)

## 2016-09-30 LAB — BRAIN NATRIURETIC PEPTIDE: Pro B Natriuretic peptide (BNP): 877 pg/mL — ABNORMAL HIGH (ref 0.0–100.0)

## 2016-09-30 LAB — BASIC METABOLIC PANEL
BUN: 29 mg/dL — ABNORMAL HIGH (ref 6–23)
CALCIUM: 9.4 mg/dL (ref 8.4–10.5)
CO2: 31 mEq/L (ref 19–32)
CREATININE: 2.32 mg/dL — AB (ref 0.40–1.50)
Chloride: 106 mEq/L (ref 96–112)
GFR: 37.89 mL/min — AB (ref >60.00)
GLUCOSE: 99 mg/dL (ref 70–99)
Potassium: 3.4 mEq/L — ABNORMAL LOW (ref 3.5–5.1)
SODIUM: 146 meq/L — AB (ref 135–145)

## 2016-09-30 MED ORDER — MOMETASONE FURO-FORMOTEROL FUM 100-5 MCG/ACT IN AERO: 2.0000 | INHALATION_SPRAY | Freq: Every day | RESPIRATORY_TRACT | 0 refills | Status: DC

## 2016-09-30 NOTE — Patient Instructions (Addendum)
Plan A = Automatic = Symbicort 80 Take 2 puffs first thing in am and then another 2 puffs about 12 hours later.     Plan B = Backup Only use your albuterol as a rescue medication to be used if you can't catch your breath by resting or doing a relaxed purse lip breathing pattern.  - The less you use it, the better it will work when you need it. - Ok to use the inhaler up to 2 puffs  every 4 hours if you must but call for appointment if use goes up over your usual need - Don't leave home without it !!  (think of it like the spare tire for your car)   Plan C = Crisis - only use your albuterol nebulizer if you first try Plan B and it fails to help > ok to use the nebulizer up to every 4 hours but if start needing it regularly call for immediate appointment   Please remember to go to the lab and x-ray department downstairs in the basement  for your tests - we will call you with the results when they are available.       Keep your follow up appt  - add inquire re use of 02/cpap/singulair next ov as appears to be ad libbing on rx

## 2016-09-30 NOTE — Progress Notes (Signed)
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  at wt 240 and maintained that wt of < 260 up until retired from [employer] in 1999 with smoke exp in [place] at "79% lung capacity" per pulmonologist on prn saba with progressive wt gain since then assoc with progessive doe so self referred to pulmonary clinic 07/24/2015 sp admit:   Admit date: 07/04/2015 Discharge date: 07/08/2015   Discharge Diagnoses:  Principal Problem:  Influenza with pneumonia Active Problems:  OSA on CPAP  Hypokalemia  Essential hypertension with nl echo 06/21/15   Elevated troponin  New rx: neb albuterol/ maint on coreg   07/24/2015 1st [facility] office visit/ [facility]   Chief Complaint  Patient presents with  . Pulmonary Consult    self ref.-sob with exertion x 5 yrs.,wt. gain,[facility] last mth. with flu/pneumonia/CHF,cough-yellow,occass. wheezing,Using ventolin 1 x this wk.has neb. machine used 2x last wk.,midchest tightness,  Peak wt 429 at [facility] with pna and breathing improved since then  Back to baseline = 100 yards s stopping and be tired at slower [facility] = can't walk a nl pace on a flat grade s sob but does fine slow and flat eg shopping ok/ crosses parking lot  Can't go up steps at all s stopping multiple times  Sleeping fine on cpap per PC Has breo but prefers to use saba  rec Plan A = Automatic = BREO  Take one click each am - one or two smooth deep drags  Plan B = Backup Only use your albuterol as a rescue medication to be used if you can't catch your breath by resting or doing a relaxed purse lip breathing pattern.  - The less you use it, the better it will work when you need it. - Ok to use the inhaler up to 2 puffs  every 4 hours if you must but call for appointment if use goes up over your usual need - Don't leave home without it !!  (think of it like the spare  tire for your car)  Plan C = Crisis - only use your albuterol nebulizer if you first try Plan B and it fails to help > ok to use the nebulizer up to every 4 hours but if start needing it regularly call for immediate appointment Please schedule a follow up office visit in 6 weeks, call sooner if needed with all inhalers in hand     09/10/2015  f/u ov/[facility]ert re:  Morbid obesity ? Asthma just on singulair and  prn saba  - no longer on coreg Chief Complaint  Patient presents with  . Follow-up    Pt states that his breathing is much improved. He has not used breo or albuterol for the past 3 wks.   improving ex tol / on NOAC for paf and h/o dvt L  rec Weight control via neg cal bal  If breathing starts getting worse or need more than twice weekly albuterol, resume the BREO one click each am Continue daily singulair (montelukast)  Return with full pfts    11/29/15  Acute flare  Take the prednisone as prescribed Take over-the-counter guaifenesin 600 mg twice a day Take Breo daily Use your albuterol as needed for chest tightness and shortness of breath We will prescribe a new CPAP machine  Admit date: 12/14/2015 Discharge date: 12/16/2015  Brief/Interim Summary:  Phillip [name] a 56 y.o.malewith medical history significant of Asthma,? COPD, atrial fibrillation, PE, DVT, diastolic heart failure, OSA, obesity. Patient presented with a one week history of cough and fatigue that suddenly worsened the day of admission. He had been seen a few weeks prior to admission by Pulmonology and prescribed a prednisone pack which he completed, but he never got back to his baseline. Hypoxemic on arrival to 85% and currently on nasal cannula O2 at 2L.  Chest x-ray was concerning for early pneumonia. Patient given Solu-Medrol, albuterol, Atrovent and Levaquin and admitted for COPD exacerbation.  He was also diuresed with lasix 40mg  IV BID.  Ins and outs were not strictly recorded and his weight was  unchanged.  Despite no diureses, he had improvement in his dyspnea and tachycardia over two days and was able to ambulate the halls on 2L [place] without significant dyspnea on the date of discharge.    Discharge Diagnoses:  Principal Problem:   CAP (community acquired pneumonia) Active Problems:   Mild persistent asthma in adult without complication   OSA on CPAP   Acute respiratory failure (HCC)   Essential hypertension   Morbid (severe) obesity due to excess calories (HCC)   SVT (supraventricular tachycardia) (HCC)   Paroxysmal atrial fibrillation (HCC)   Atrial fibrillation ([place]) [I48.91]   Long term (current) use of anticoagulants [Z79.01]   Hx pulmonary embolism   DVT (deep venous thrombosis) (HCC)  Acute respiratory failure with hypoxemia likely secondary to asthma exacerbation, possible pneumonia and possible acute on chronic diastolic heart failure.His BNP is elevated and patient is up a few pounds. Last EF of 55-60% in March 2017 -Continue Levaquin for treatment of CAP -Continued home inhalers - Resumed torsemide - Prednisone taper with 10mg  daily until follow up with Pulmonology - Started spiriva -  Close follow up with pulmonology  Hypertension, BP mildly elevated -Continue home clonidine and losartan  Atrial fibrillation, generally a-fib with runs of SVT or RVR on telemetry, rate controlled - No BB due to asthma/COPD - Continued xarelto  History of pulmonary embolism and DVT -Continued Xarelto   Obstructive sleep apnea -Continued CPAP nightly  CKD stage III, stable  Morbid obesity Body mass index is 55.5 kg/m    12/27/2015  f/u ov/[facility]ert re: post hosp f/u transition of care  Chief Complaint  Patient presents with  . Hospitalization Follow-up    Breathing is "so so"- not back to baseline since hospital d/c. He is not wheezing and has very little cough.   typically wakes up 10 am then breo/ spiriva and no need for rescue at all  Has 02 to use only  as needed cpap x years but not clear under whose direction rec Plan A = Automatic =  BREO/ Spiriva daily on rising to start your day regardless of what time of the day it is  Plan B = Backup Only use your albuterol (proair)  Plan C = Crisis - only use your albuterol nebulizer if you first try Plan B and it fails to help > ok to use the nebulizer up to every 4 hours but if start needing it regularly call for immediate appointment Keep your appointment for pfts and follow up here same day      01/21/2016  f/u ov/[facility] re: asthma/ breo 100and no spiriva since 01/19/16  Has 02 not using  Chief Complaint  Patient presents with  . Follow-up    PFT's done  today. Breathing is unchanged. He has minimal non prod cough. He has not had to use albuterol inhaler or neb.   sleeping ok on new cpap machine from [facility]  - not limited by breathing from desired activities  But very sedentary/ last pred 01/21/2016  rec We need a download from your cpap machine and we will be referring you to a sleep specialist if needed  Stop Breo and start symbicort 160 Take 2 puffs first thing in am and then another 2 puffs about 12 hours later.     04/22/2016  f/u ov/[facility] re: asthma maint on symb 160 2bid / no saba Chief Complaint  Patient presents with  . Follow-up    He has had some minimal chest and nasal congestion recently. He has been coughing some, non prod. He has not needed albuterol inhaler or neb.    mild sense of throat and chest congestion on symb 160 2bid but sleeping fine on cpap s 02 and no noct/am flares rec GERD   Work on inhaler technique:    Plan A = Automatic = change symbicort to 80 Take 2 puffs first thing in am and then another 2 puffs about 12 hours later.  Plan B = Backup Only use your albuterol (proair) as a rescue medication   Plan C = Crisis - only use your albuterol nebulizer if you first try Plan B and it fails to help      07/21/2016  f/u ov/[facility]ert re: chronic asthma/ better on lower  dose symb since last ov / no need for saba hfa or neb  Chief Complaint  Patient presents with  . Follow-up    3 mo f/u for asthma. Denies any increased SOB or chest pains. Is still losing weight and is noticing his breathing is getting better.   rec Work on inhaler technique    09/30/2016  Acute ov/[facility] re: chronic asthma  symbicort 80  2bid / 02 2lpm hs  Chief Complaint  Patient presents with  . Acute Visit    Pt states every morning he hears a rattling in his chest, still having the sob while walking, still has an occ. cough but mainly non productive, only has chest tightness when he exerts himself, he states he does has some chest conegstion Denies fever, pt unsure if his recent trip to [place] has anything to do with his symtpoms   worse x 3 weeks but better since in ER with dx with chf 09/15/16 and sleeping fine on cpap  Much better p lasix Some better after saba but mostly using neb and skipping hfa saba in action paln  Minimal rattling better p extra dose of diuretic   No obvious day to day or daytime variability or assoc excess/ purulent sputum or mucus plugs or hemoptysis or cp or chest tightness, subjective wheeze or overt sinus or hb symptoms. No unusual exp hx or h/o childhood pna/ asthma or knowledge of premature birth.  Sleeping ok without nocturnal  or early am exacerbation  of respiratory  c/o's or need for noct saba. Also denies any obvious fluctuation of symptoms with weather or environmental changes or other aggravating or alleviating factors except as outlined above   Current Medications, Allergies, Complete Past Medical History, Past Surgical History, Family History, and Social History were reviewed in [employer] record.  ROS  The following are not active complaints unless bolded sore throat, dysphagia, dental problems, itching, sneezing,  nasal congestion or excess/ purulent secretions, ear ache,  fever, chills, sweats, unintended wt loss,  classically pleuritic or exertional cp,  orthopnea pnd or leg swelling, presyncope, palpitations, abdominal pain, anorexia, nausea, vomiting, diarrhea  or change in bowel or bladder habits, change in stools or urine, dysuria,hematuria,  rash, arthralgias, visual complaints, headache, numbness, weakness or ataxia or problems with walking or coordination,  change in mood/affect or memory.                       Objective:   Physical Exam    massively obese bm nad      09/30/2016        358  07/21/2016        362  04/22/2016        374  01/21/2016      390  12/27/2015        387   09/10/2015       372   07/24/15 389 lb 6.4 oz (176.631 kg)  07/08/15 384 lb 3.2 oz (174.272 kg)  06/21/15 390 lb 8 oz (177.13 kg)    Vital signs reviewed -  - Note on arrival 02 sats  93% on RA     HEENT: nl dentition, turbinates, and oropharynx. Nl external ear canals without cough reflex   NECK :  without JVD/Nodes/TM/ nl carotid upstrokes bilaterally   LUNGS: no acc muscle use,  Nl contour chest which is clear to A and P bilaterally without cough or wheezing on insp or exp maneuvers   CV:  RRR  no s3 or murmur or increase in P2, trace sym bilateral lower ext edema   ABD:  soft and nontender with nl inspiratory excursion in the supine position. No bruits or organomegaly, bowel sounds nl  MS:  Nl gait/ ext warm without deformities, calf tenderness, cyanosis or clubbing No obvious joint restrictions   SKIN: warm and dry without lesions    NEURO:  alert, approp, nl sensorium with  no motor deficits     CXR PA and Lateral:   09/30/2016 :    I personally reviewed images and agree with radiology impression as follows:    Cardiomegaly with mild central vascular congestion. No edema or Infiltrate.   Labs ordered/ reviewed:      Chemistry      Component Value Date/Time   NA 146 (H) 09/30/2016 1452   K 3.4 (L) 09/30/2016 1452   CL 106 09/30/2016 1452   CO2 31 09/30/2016 1452   BUN 29 (H)  09/30/2016 1452   CREATININE 2.32 (H) 09/30/2016 1452   CREATININE 1.79 (H) 02/25/2016 1352      Component Value Date/Time   CALCIUM 9.4 09/30/2016 1452   ALKPHOS 61 06/02/2016 1821   AST 22 06/02/2016 1821   ALT 17 06/02/2016 1821   BILITOT 0.7 06/02/2016 1821        Lab Results  Component Value Date   WBC 5.3 09/30/2016   HGB 12.5 (L) 09/30/2016   HCT 38.8 (L) 09/30/2016   MCV 84.8 09/30/2016   PLT 164.0 09/30/2016       EOS                        0.0      09/30/2016      Lab Results  Component Value Date   TSH 1.536 02/04/2016     Lab Results  Component Value Date   PROBNP 877.0 (H) 09/30/2016  Assessment:

## 2016-10-01 LAB — RESPIRATORY ALLERGY PROFILE REGION II ~~LOC~~
Allergen, A. alternata, m6: <0.1 kU/L
Allergen, Cedar tree, t12: <0.1 kU/L
Allergen, Comm Silver Birch, t9: <0.1 kU/L
Allergen, Cottonwood, t14: <0.1 kU/L
Allergen, Mouse Urine Protein, e78: <0.1 kU/L
Allergen, Oak,t7: <0.1 kU/L
Allergen, P. notatum, m1: <0.1 kU/L
Cat Dander: <0.1 kU/L
Common Ragweed: <0.1 kU/L
Dog Dander: <0.1 kU/L
IgE (Immunoglobulin E), Serum: 7 kU/L (ref <115)
Pecan/Hickory Tree IgE: <0.1 kU/L
Rough Pigweed  IgE: <0.1 kU/L
Timothy Grass: <0.1 kU/L

## 2016-10-01 NOTE — Assessment & Plan Note (Addendum)
singulair maint rx as of 09/10/2015 with NO = 31 / no saba or other controller needed  - admit 12/14/15 with flare ? Assoc with chf/ cap - PFT's  01/21/2016  FEV1 2.11 (71 % ) ratio 84  p 12 % improvement from saba p breo 100 and on last day of pred taper  prior to study with DLCO  73/82  % corrects to 120 % for alv volume  - 01/21/2016    change to symbicort  160 2bid  - FENO 04/22/2016  =   16 on symb 160 with sub optimal technique and singuair > d/c'd singulair  - 04/22/2016    try symbicort 80 2bid  - 09/30/2016  After extensive coaching HFA effectiveness =    90%  DDX of  difficult airways management almost all start with A and  include Adherence, Ace Inhibitors, Acid Reflux, Active Sinus Disease, Alpha 1 Antitripsin deficiency, Anxiety masquerading as Airways dz,  ABPA,  Allergy(esp in young), Aspiration (esp in elderly), Adverse effects of meds,  Active smokers, A bunch of PE's (a small clot burden can't cause this syndrome unless there is already severe underlying pulm or vascular dz with poor reserve) plus two Bs  = Bronchiectasis and Beta blocker use..and one C= CHF   Adherence is always the initial "prime suspect" and is a multilayered concern that requires a "trust but verify" approach in every patient - starting with knowing how to use medications, especially inhalers, correctly, keeping up with refills and understanding the fundamental difference between maintenance and prns vs those medications only taken for a very short course and then stopped and not refilled.  - see hfa teaching  ? Allergy > doubt active, symb 80 2 bid should be adequate and will send allergy profile to be complete but note the absence of eosinophils on peripheral sample - note also stopped singulair which is ok for now.  ? A bunch of PE's > very unlikely as he is on Xarelto  ? CHF/ cardiac asthma is the most likely explanation here since he did improve on diuresis and continues to have evidence of CHF on chest x-ray  and by BNP. He was referred back to cardiology for this purpose.   I had an extended discussion with the patient reviewing all relevant studies completed to date and  lasting 25 minutes of a 40  minute post er f/u office  visit     re  severe non-specific but potentially very serious refractory respiratory symptoms of uncertain and potentially multiple  etiologies.   Each maintenance medication was reviewed in detail including most importantly the difference between maintenance and prns and under what circumstances the prns are to be triggered using an action plan format that is not reflected in the computer generated alphabetically organized AVS.    Please see AVS for specific instructions unique to this office visit that I personally wrote and verbalized to the the pt in detail and then reviewed with pt  by my nurse highlighting any changes in therapy/plan of care  recommended at today's visit.

## 2016-10-01 NOTE — Assessment & Plan Note (Signed)
Obviously multifactorial with CHF and obesity much greater than asthma at this point.

## 2016-10-01 NOTE — Assessment & Plan Note (Signed)
ERV  01/21/2016  = 24% c/w body habitus   Body mass index is 52.17 kg/m.  -  trending down with diuresis  Lab Results  Component Value Date   TSH 1.536 02/04/2016     Contributing to gerd risk/ doe/reviewed the need and the process to achieve and maintain neg calorie balance > defer f/u primary care including intermittently monitoring thyroid status

## 2016-10-02 ENCOUNTER — Ambulatory Visit (INDEPENDENT_AMBULATORY_CARE_PROVIDER_SITE_OTHER): Payer: Managed Care, Other (non HMO) | Admitting: Interventional Cardiology

## 2016-10-02 ENCOUNTER — Encounter: Payer: Self-pay | Admitting: Interventional Cardiology

## 2016-10-02 VITALS — BP 136/88 | HR 95 | Ht 69.5 in | Wt 361.8 lb

## 2016-10-02 DIAGNOSIS — I480 Paroxysmal atrial fibrillation: Secondary | ICD-10-CM

## 2016-10-02 DIAGNOSIS — Z7901 Long term (current) use of anticoagulants: Secondary | ICD-10-CM | POA: Diagnosis not present

## 2016-10-02 DIAGNOSIS — Z9989 Dependence on other enabling machines and devices: Secondary | ICD-10-CM

## 2016-10-02 DIAGNOSIS — I10 Essential (primary) hypertension: Secondary | ICD-10-CM | POA: Diagnosis not present

## 2016-10-02 DIAGNOSIS — N183 Chronic kidney disease, stage 3 unspecified: Secondary | ICD-10-CM

## 2016-10-02 DIAGNOSIS — G4733 Obstructive sleep apnea (adult) (pediatric): Secondary | ICD-10-CM | POA: Diagnosis not present

## 2016-10-02 MED ORDER — TORSEMIDE 20 MG PO TABS
ORAL_TABLET | ORAL | 3 refills | Status: DC
Start: 2016-10-02 — End: 2016-12-24

## 2016-10-02 MED ORDER — POTASSIUM CHLORIDE CRYS ER 20 MEQ PO TBCR: 20.0000 meq | EXTENDED_RELEASE_TABLET | Freq: Every day | ORAL | 3 refills | Status: DC

## 2016-10-02 NOTE — Progress Notes (Signed)
Cardiology Office Note    Date:  10/02/2016   ID:  Phillip, [name] 1960/05/24, MRN [id number]  PCP:  [name], MD  Cardiologist: Sinclair Grooms, MD   Chief Complaint  Patient presents with  . Congestive Heart Failure  . Obesity    History of Present Illness:  Phillip [name] is a 56 y.o. male  hx of PAF, remote PE, hx of DVT, OSA, morbid obesity and HTN also with combined chronic systolic and diastolic HF. With EF 40-45%. He has been wearing CPAP. With CHA2DS2VASc score 2 and pt on xarelto. Last echo 06/21/15 with EF 55-60% G1DD, moderate aortic regurgitation. Last nuc 2008 no ischemia, no hx of cath.   He continues to use the emergency room almost his primary care facility. Most recently there 09/30/16 with shortness of breath. Reviewing the emergency room physician's note they state bronchodilator therapy improve the shortness of breath. Patient states that he did not get better and only improved after taking additional torsemide at home. Otherwise doing okay. No set he has been liberally using extra torsemide to help with fluid removal which improves his breathing. He denies chest pain. No significant prolonged periods of palpitation.  Past Medical History:  Diagnosis Date  . CHF (congestive heart failure) ([place]) 11/15   EF 40-45%, improved with follow-up  . Gout   . Hypertension   . Hypertensive cardiovascular disease   . Morbid obesity ([place])   . Obstructive sleep apnea    complaint with CPAP  . OSA on CPAP   . Paroxysmal atrial fibrillation ([facility])   . PNA (pneumonia)   . Pulmonary embolism [facility])     Past Surgical History:  Procedure Laterality Date  . UMBILICAL HERNIA REPAIR  1992    Current Medications: Outpatient Medications Prior to Visit  Medication Sig Dispense Refill  . albuterol (PROVENTIL HFA;VENTOLIN HFA) 108 (90 Base) MCG/ACT inhaler Inhale 2 puffs into the lungs every 4 (four) hours as needed for wheezing or shortness of breath.    [name] albuterol  (PROVENTIL) (2.5 MG/3ML) 0.083% nebulizer solution Take 3 mLs (2.5 mg total) by nebulization every 6 (six) hours as needed for wheezing or shortness of breath. 360 mL 11  . allopurinol (ZYLOPRIM) 300 MG tablet Take 1 tablet (300 mg total) by mouth daily. 30 tablet 6  . budesonide-formoterol (SYMBICORT) 80-4.5 MCG/ACT inhaler Inhale 2 puffs into the lungs 2 (two) times daily. 1 Inhaler 0  . cloNIDine (CATAPRES) 0.1 MG tablet Take 1 tablet (0.1 mg total) by mouth 3 (three) times daily. 90 tablet 0  . colchicine 0.6 MG tablet Take 1 tablet (0.6 mg total) by mouth daily. 30 tablet 0  . hydrALAZINE (APRESOLINE) 50 MG tablet Take 50 mg by mouth 2 (two) times daily.    [name] HYDROcodone-acetaminophen (NORCO/VICODIN) 5-325 MG tablet Take 1 tablet by mouth every 4 (four) hours as needed. 6 tablet 0  . mometasone-formoterol (DULERA) 100-5 MCG/ACT AERO Inhale 2 puffs into the lungs daily. 1 Inhaler 0  . NONFORMULARY OR COMPOUNDED ITEM Apply 1 drop topically daily. Nail Lacquer: Fluconazole 2% Terbinafine 1% DMSO 30 each 2  . OXYGEN Inhale 2 L into the lungs daily. 2lpm when needed per pt     . UNABLE TO FIND Med Name: CPAP with sleep    . XARELTO 20 MG TABS tablet TAKE 1 TABLET BY MOUTH DAILY WITH SUPPER 30 tablet 5  . torsemide (DEMADEX) 20 MG tablet Take 1 tablet (20 mg total) by mouth 2 (  two) times daily. 180 tablet 2  . albuterol (PROVENTIL) (2.5 MG/3ML) 0.083% nebulizer solution TAKE 3 ML'S BY NEBULIZATION EVERY 4 HOURS AS NEEDED FOR WHEEZING OR SHORTNESS OF BREATH (Patient not taking: Reported on 10/02/2016) 120 mL 2  . terbinafine (LAMISIL) 250 MG tablet Take 1 tablet (250 mg total) by mouth daily. (Patient not taking: Reported on 09/30/2016) 30 tablet 2   No facility-administered medications prior to visit.      Allergies:   Patient has no known allergies.   Social History   Social History  . Marital status: Divorced    Spouse name: N/A  . Number of children: N/A  . Years of education: N/A    Occupational History  . disabled, drove a dump truck    Social History Main Topics  . Smoking status: Former Smoker    Packs/day: 0.25    Years: 1.00    Types: Cigarettes    Quit date: 07/23/1989  . Smokeless tobacco: Never Used  . Alcohol use No  . Drug use: No  . Sexual activity: Not Currently   Other Topics Concern  . None   Social History Narrative   Pt lives in [place], alone.   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   Has 2 boys.     Family History:  The patient's family history includes Allergies in his brother; CAD in his father; Diabetes in his father and other; Hypertension in his other.   ROS:   Please see the history of present illness.    Obese, unrestricted caloric intake, otherwise no complaints.  All other systems reviewed and are negative.   PHYSICAL EXAM:   VS:  BP 136/88 (BP Location: Left Arm)   Pulse 95   Ht 5' 9.5" (1.765 m)   Wt (!) 361 lb 12.8 oz (164.1 kg)   BMI 52.66 kg/m    GEN: Well nourished, well developed, in no acute distress . Morbidly obese. Weight is relatively stable over the last 12 months with perhaps a 10 pound reduction. HEENT: normal  Neck: no JVD, carotid bruits, or masses Cardiac: RRR; no murmurs, rubs, or gallops,no edema  Respiratory:  clear to auscultation bilaterally, normal work of breathing GI: soft, nontender, nondistended, + BS MS: no deformity or atrophy  Skin: warm and dry, no rash Neuro:  Alert and Oriented x 3, Strength and sensation are intact Psych: euthymic mood, full affect  Wt Readings from Last 3 Encounters:  10/02/16 (!) 361 lb 12.8 oz (164.1 kg)  09/30/16 (!) 358 lb 6.4 oz (162.6 kg)  09/15/16 (!) 368 lb (166.9 kg)      Studies/Labs Reviewed:   EKG:  EKG  Normal sinus rhythm, left atrial abnormality, left axis deviation, and poor wave progression noted on EKG performed in the emergency room on 09/15/2016  Recent Labs: 02/04/2016: TSH 1.536 02/05/2016:  Magnesium 2.1 06/02/2016: ALT 17 09/15/2016: B Natriuretic Peptide 868.5 09/30/2016: BUN 29; Creatinine, Ser 2.32; Hemoglobin 12.5; Platelets 164.0; Potassium 3.4; Pro B Natriuretic peptide (BNP) 877.0; Sodium 146   Lipid Panel    Component Value Date/Time   CHOL 175 02/21/2015 0431   TRIG 37 02/21/2015 0431   HDL 66 02/21/2015 0431   CHOLHDL 2.7 02/21/2015 0431   VLDL 7 02/21/2015 0431   LDLCALC 102 (H) 02/21/2015 0431    Additional studies/ records that were reviewed today include:  Transient fluctuations and creatinine between 1.3 and 2.2 have been noted over the past 3 years  upon reviewing his laboratory data.  Continues to wear C Pap chronically.    ASSESSMENT:    1. Essential hypertension   2. Paroxysmal atrial fibrillation (HCC)   3. OSA on CPAP   4. Chronic kidney disease, stage 3   5. Long term (current) use of anticoagulants [Z79.01]   6. Morbid (severe) obesity due to excess calories ([place])      PLAN:  In order of problems listed above:  1. Adequate blood pressure control today. The fact that he uses much much more than 20 mg twice a day of torsemide, I will increase him to 40 mg a.m. and 20 mg p.m. Add Kay Dur 20 mEq per day. Basic metabolic panel in 5-40 days. I've encouraged him to not use additional torsemide than what we have prescribed. Clinical follow-up in 3-4 months. Call us if he continues to have recurring episodes of shortness of breath. 2. No episodes of recurrent atrial fibrillation that he is aware of. No bleeding complications on Xarelto. 3. Continue C Pap as noted. 4. Fluctuating BUN and creatinine 5. Watch for evidence of bleeding. May also need to make an adjustment in Xarelto dose. 6. I discussed bariatric surgery as a potential therapy to help with weight reduction. Obesity is his major cardiac risk factor at this point and has a lot to do with his blood pressure and systolic dysfunction.   Avoid taking extra torsemide. Call if she feels worse  on 60 mg per day rather than 40 mg per day. Clinical follow-up in 3-4 months. Bmet 10-14 days.   Medication Adjustments/Labs and Tests Ordered: Current medicines are reviewed at length with the patient today.  Concerns regarding medicines are outlined above.  Medication changes, Labs and Tests ordered today are listed in the Patient Instructions below. Patient Instructions  Medication Instructions:  1) INCREASE Torsemide to 40mg  (2 tablets) in the morning and 20mg  (1 tablet) in the evening. 2) START Potassium 81mEq once daily  Labwork: Your physician recommends that you return for lab work in: 2 weeks (BMET)   Testing/Procedures: None  Follow-Up: Your physician recommends that you schedule a follow-up appointment in: 3-4 months with Dr. [name].    Any Other Special Instructions Will Be Listed Below (If Applicable).     If you need a refill on your cardiac medications before your next appointment, please call your pharmacy.      Signed, Sinclair Grooms, MD  10/02/2016 4:50 PM    [facility] [address], [place], [place]  [address] Phone: [phone]; Fax: [phone]

## 2016-10-02 NOTE — Patient Instructions (Signed)
Medication Instructions:  1) INCREASE Torsemide to 40mg  (2 tablets) in the morning and 20mg  (1 tablet) in the evening. 2) START Potassium 10mEq once daily  Labwork: Your physician recommends that you return for lab work in: 2 weeks (BMET)   Testing/Procedures: None  Follow-Up: Your physician recommends that you schedule a follow-up appointment in: 3-4 months with Dr. [name].    Any Other Special Instructions Will Be Listed Below (If Applicable).     If you need a refill on your cardiac medications before your next appointment, please call your pharmacy.

## 2016-10-16 ENCOUNTER — Other Ambulatory Visit: Payer: Managed Care, Other (non HMO) | Admitting: *Deleted

## 2016-10-16 DIAGNOSIS — N183 Chronic kidney disease, stage 3 unspecified: Secondary | ICD-10-CM

## 2016-10-16 DIAGNOSIS — I480 Paroxysmal atrial fibrillation: Secondary | ICD-10-CM

## 2016-10-16 DIAGNOSIS — I10 Essential (primary) hypertension: Secondary | ICD-10-CM

## 2016-10-16 LAB — BASIC METABOLIC PANEL
BUN/Creatinine Ratio: 15 (ref 9–20)
BUN: 29 mg/dL — ABNORMAL HIGH (ref 6–24)
CALCIUM: 9.5 mg/dL (ref 8.7–10.2)
CO2: 26 mmol/L (ref 20–29)
Chloride: 101 mmol/L (ref 96–106)
Creatinine, Ser: 2 mg/dL — ABNORMAL HIGH (ref 0.76–1.27)
GFR calc Af Amer: 42 mL/min/{1.73_m2} — ABNORMAL LOW (ref >59)
GFR calc non Af Amer: 37 mL/min/{1.73_m2} — ABNORMAL LOW (ref >59)
Glucose: 78 mg/dL (ref 65–99)
POTASSIUM: 3.5 mmol/L (ref 3.5–5.2)
Sodium: 143 mmol/L (ref 134–144)

## 2016-10-17 ENCOUNTER — Other Ambulatory Visit: Payer: Self-pay | Admitting: *Deleted

## 2016-10-17 DIAGNOSIS — E876 Hypokalemia: Secondary | ICD-10-CM

## 2016-10-17 MED ORDER — POTASSIUM CHLORIDE CRYS ER 20 MEQ PO TBCR: 30.0000 meq | EXTENDED_RELEASE_TABLET | Freq: Every day | ORAL | 3 refills | Status: DC

## 2016-11-17 ENCOUNTER — Other Ambulatory Visit: Payer: Managed Care, Other (non HMO) | Admitting: *Deleted

## 2016-11-17 DIAGNOSIS — E876 Hypokalemia: Secondary | ICD-10-CM

## 2016-11-17 LAB — BASIC METABOLIC PANEL
BUN / CREAT RATIO: 11 (ref 9–20)
BUN: 20 mg/dL (ref 6–24)
CHLORIDE: 103 mmol/L (ref 96–106)
CO2: 26 mmol/L (ref 20–29)
Calcium: 9 mg/dL (ref 8.7–10.2)
Creatinine, Ser: 1.87 mg/dL — ABNORMAL HIGH (ref 0.76–1.27)
GFR calc non Af Amer: 40 mL/min/{1.73_m2} — ABNORMAL LOW (ref >59)
GFR, EST AFRICAN AMERICAN: 46 mL/min/{1.73_m2} — AB (ref >59)
GLUCOSE: 97 mg/dL (ref 65–99)
POTASSIUM: 3.5 mmol/L (ref 3.5–5.2)
Sodium: 145 mmol/L — ABNORMAL HIGH (ref 134–144)

## 2016-11-28 ENCOUNTER — Telehealth: Payer: Self-pay | Admitting: Internal Medicine

## 2016-11-28 MED ORDER — ALBUTEROL SULFATE HFA 108 (90 BASE) MCG/ACT IN AERS: 2.0000 | INHALATION_SPRAY | RESPIRATORY_TRACT | 2 refills | Status: DC | PRN

## 2016-11-28 NOTE — Telephone Encounter (Signed)
Spoke with the pt  He states having some cough and congestion and has noticed slight increase in SOB over the past 2 days Still taking symb 2 puffs q12 h  His ventolin inhaler has expired and so he has not been using this  I refilled this for him  Advised can take mucinex for cough and congestion prn  Call Monday for appt if not improving or seek emergent care should symptoms persist or worsen over the wkend  Will forward to MW to make him aware

## 2016-12-01 ENCOUNTER — Telehealth: Payer: Self-pay | Admitting: Internal Medicine

## 2016-12-01 MED ORDER — PREDNISONE 10 MG PO TABS: ORAL_TABLET | ORAL | 0 refills | Status: DC

## 2016-12-01 MED ORDER — ALBUTEROL SULFATE HFA 108 (90 BASE) MCG/ACT IN AERS: 2.0000 | INHALATION_SPRAY | RESPIRATORY_TRACT | 2 refills | Status: DC | PRN

## 2016-12-01 NOTE — Telephone Encounter (Signed)
Prednisone 10 mg take  4 each am x 2 days,   2 each am x 2 days,  1 each am x 2 days and stop  Ov with all meds in hand in 2weeks to see me or NPs

## 2016-12-01 NOTE — Telephone Encounter (Signed)
Rx has been sent to preferred pharmacy. Pt has been scheduled for OV with MW on 12/16/16. Pt is aware and voiced his understanding.

## 2016-12-01 NOTE — Telephone Encounter (Signed)
Pt requesting ventolin refill to be sent to pharmacy.  This has been sent.  Pt also c/o chest tightness, sob Xseveral days.  States he feels like he has chest congestion but is unable to cough up anything.   Denies fever.  States only thing that helps the cough is his ventolin.  Pt requesting further recs.    MW please advise. Thanks.  7/10 AVS: Patient Instructions   Plan A = Automatic = Symbicort 80 Take 2 puffs first thing in am and then another 2 puffs about 12 hours later.      Plan B = Backup Only use your albuterol as a rescue medication to be used if you can't catch your breath by resting or doing a relaxed purse lip breathing pattern.  - The less you use it, the better it will work when you need it. - Ok to use the inhaler up to 2 puffs  every 4 hours if you must but call for appointment if use goes up over your usual need - Don't leave home without it !!  (think of it like the spare tire for your car)    Plan C = Crisis - only use your albuterol nebulizer if you first try Plan B and it fails to help > ok to use the nebulizer up to every 4 hours but if start needing it regularly call for immediate appointment     Please remember to go to the lab and x-ray department downstairs in the basement  for your tests - we will call you with the results when they are available.        Keep your follow up appt  - add inquire re use of 02/cpap/singulair next ov as appears to be ad libbing on rx

## 2016-12-16 ENCOUNTER — Ambulatory Visit (INDEPENDENT_AMBULATORY_CARE_PROVIDER_SITE_OTHER): Payer: Managed Care, Other (non HMO) | Admitting: Internal Medicine

## 2016-12-16 ENCOUNTER — Encounter: Payer: Self-pay | Admitting: Internal Medicine

## 2016-12-16 VITALS — BP 148/82 | HR 93 | Ht 69.5 in | Wt 372.0 lb

## 2016-12-16 DIAGNOSIS — I10 Essential (primary) hypertension: Secondary | ICD-10-CM | POA: Diagnosis not present

## 2016-12-16 DIAGNOSIS — J4530 Mild persistent asthma, uncomplicated: Secondary | ICD-10-CM | POA: Diagnosis not present

## 2016-12-16 MED ORDER — MONTELUKAST SODIUM 10 MG PO TABS: 10.0000 mg | ORAL_TABLET | Freq: Every day | ORAL | 11 refills | Status: DC

## 2016-12-16 NOTE — Progress Notes (Signed)
Subjective:     Patient ID: Phillip [name], male   DOB: Jun 08, 1960,     MRN: [id number]   Brief patient profile:  63  yobm no significant  smoking hx / played LB at semi pro level  at wt 240 and maintained that wt of < 260 up until retired from [employer] in 1999 with smoke exp in [place] at "79% lung capacity" per pulmonologist on prn saba with progressive wt gain since then assoc with progessive doe so self referred to pulmonary clinic 07/24/2015 sp admit:   Admit date: 07/04/2015 Discharge date: 07/08/2015   Discharge Diagnoses:  Principal Problem:  Influenza with pneumonia Active Problems:  OSA on CPAP  Hypokalemia  Essential hypertension with nl echo 06/21/15   Elevated troponin  New rx: neb albuterol/ maint on coreg   07/24/2015 1st [facility] office visit/ Nadie Fiumara   Chief Complaint  Patient presents with  . Pulmonary Consult    self ref.-sob with exertion x 5 yrs.,wt. gain,[facility] last mth. with flu/pneumonia/CHF,cough-yellow,occass. wheezing,Using ventolin 1 x this wk.has neb. machine used 2x last wk.,midchest tightness,  Peak wt 429 at [facility] with pna and breathing improved since then  Back to baseline = 100 yards s stopping and be tired at slower [facility] = can't walk a nl pace on a flat grade s sob but does fine slow and flat eg shopping ok/ crosses parking lot  Can't go up steps at all s stopping multiple times  Sleeping fine on cpap per PC Has breo but prefers to use saba  rec Plan A = Automatic = BREO  Take one click each am - one or two smooth deep drags  Plan B = Backup Only use your albuterol as a rescue medication to be used if you can't catch your breath by resting or doing a relaxed purse lip breathing pattern.  - The less you use it, the better it will work when you need it. - Ok to use the inhaler up to 2 puffs  every 4 hours if you must but call for appointment if use goes up over your usual need - Don't leave home without it !!  (think of it like the spare  tire for your car)  Plan C = Crisis - only use your albuterol nebulizer if you first try Plan B and it fails to help > ok to use the nebulizer up to every 4 hours but if start needing it regularly call for immediate appointment Please schedule a follow up office visit in 6 weeks, call sooner if needed with all inhalers in hand     09/10/2015  f/u ov/Kien Mirsky re:  Morbid obesity ? Asthma just on singulair and  prn saba  - no longer on coreg Chief Complaint  Patient presents with  . Follow-up    Pt states that his breathing is much improved. He has not used breo or albuterol for the past 3 wks.   improving ex tol / on NOAC for paf and h/o dvt L  rec Weight control via neg cal bal  If breathing starts getting worse or need more than twice weekly albuterol, resume the BREO one click each am Continue daily singulair (montelukast)  Return with full pfts    11/29/15  Acute flare  Take the prednisone as prescribed Take over-the-counter guaifenesin 600 mg twice a day Take Breo daily Use your albuterol as needed for chest tightness and shortness of breath We will prescribe a new CPAP machine  Admit date: 12/14/2015 Discharge date: 12/16/2015  Brief/Interim Summary:  Phillip [name] a 56 y.o.malewith medical history significant of Asthma,? COPD, atrial fibrillation, PE, DVT, diastolic heart failure, OSA, obesity. Patient presented with a one week history of cough and fatigue that suddenly worsened the day of admission. He had been seen a few weeks prior to admission by Pulmonology and prescribed a prednisone pack which he completed, but he never got back to his baseline. Hypoxemic on arrival to 85% and currently on nasal cannula O2 at 2L.  Chest x-ray was concerning for early pneumonia. Patient given Solu-Medrol, albuterol, Atrovent and Levaquin and admitted for COPD exacerbation.  He was also diuresed with lasix 40mg  IV BID.  Ins and outs were not strictly recorded and his weight was  unchanged.  Despite no diureses, he had improvement in his dyspnea and tachycardia over two days and was able to ambulate the halls on 2L [place] without significant dyspnea on the date of discharge.    Discharge Diagnoses:  Principal Problem:   CAP (community acquired pneumonia) Active Problems:   Mild persistent asthma in adult without complication   OSA on CPAP   Acute respiratory failure (HCC)   Essential hypertension   Morbid (severe) obesity due to excess calories (HCC)   SVT (supraventricular tachycardia) (HCC)   Paroxysmal atrial fibrillation (HCC)   Atrial fibrillation ([place]) [I48.91]   Long term (current) use of anticoagulants [Z79.01]   Hx pulmonary embolism   DVT (deep venous thrombosis) (HCC)  Acute respiratory failure with hypoxemia likely secondary to asthma exacerbation, possible pneumonia and possible acute on chronic diastolic heart failure.His BNP is elevated and patient is up a few pounds. Last EF of 55-60% in March 2017 -Continue Levaquin for treatment of CAP -Continued home inhalers - Resumed torsemide - Prednisone taper with 10mg  daily until follow up with Pulmonology - Started spiriva -  Close follow up with pulmonology  Hypertension, BP mildly elevated -Continue home clonidine and losartan  Atrial fibrillation, generally a-fib with runs of SVT or RVR on telemetry, rate controlled - No BB due to asthma/COPD - Continued xarelto  History of pulmonary embolism and DVT -Continued Xarelto   Obstructive sleep apnea -Continued CPAP nightly  CKD stage III, stable  Morbid obesity Body mass index is 55.5 kg/m    12/27/2015  f/u ov/Vada Yellen re: post hosp f/u transition of care  Chief Complaint  Patient presents with  . Hospitalization Follow-up    Breathing is "so so"- not back to baseline since hospital d/c. He is not wheezing and has very little cough.   typically wakes up 10 am then breo/ spiriva and no need for rescue at all  Has 02 to use only  as needed cpap x years but not clear under whose direction rec Plan A = Automatic =  BREO/ Spiriva daily on rising to start your day regardless of what time of the day it is  Plan B = Backup Only use your albuterol (proair)  Plan C = Crisis - only use your albuterol nebulizer if you first try Plan B and it fails to help > ok to use the nebulizer up to every 4 hours but if start needing it regularly call for immediate appointment Keep your appointment for pfts and follow up here same day      01/21/2016  f/u ov/Emmersen Garraway re: asthma/ breo 100and no spiriva since 01/19/16  Has 02 not using  Chief Complaint  Patient presents with  . Follow-up    PFT's done  today. Breathing is unchanged. He has minimal non prod cough. He has not had to use albuterol inhaler or neb.   sleeping ok on new cpap machine from [facility]ps  - not limited by breathing from desired activities  But very sedentary/ last pred 01/21/2016  rec We need a download from your cpap machine and we will be referring you to a sleep specialist if needed  Stop Breo and start symbicort 160 Take 2 puffs first thing in am and then another 2 puffs about 12 hours later.     09/30/2016  Acute ov/Thekla Colborn re: chronic asthma  symbicort 80  2bid / 02 2lpm hs  Chief Complaint  Patient presents with  . Acute Visit    Pt states every morning he hears a rattling in his chest, still having the sob while walking, still has an occ. cough but mainly non productive, only has chest tightness when he exerts himself, he states he does has some chest conegstion Denies fever, pt unsure if his recent trip [place] has anything to do with his symtpoms   worse x 3 weeks but better since in ER with dx with chf 09/15/16 and sleeping fine on cpap  Much better p lasix Some better after saba but mostly using neb and skipping hfa saba in action paln  Minimal rattling better p extra dose of diuretic rec Plan A = Automatic = Symbicort 80 Take 2 puffs first thing in am and then  another 2 puffs about 12 hours later.  Plan B = Backup Only use your albuterol as a rescue medication to be used if you can't catch your breath Plan C = Crisis - only use your albuterol nebulizer if you first try Plan B           12/16/2016  f/u ov/Carnelia Oscar re: symbicort 80 plus prn sabas/ no 02 at all  Chief Complaint  Patient presents with  . Follow-up    Increased wheezing in the am x 2 months. He has been using his albuterol inhaler 2 x per day on average. He rarely uses his neb. He had some prod cough with yellow sputum 1 wk ago.    Off singulair since 03/2016 overall worse since but sleeps fine on cpap s 02  - s cpap wakes up gagging and "wheezing"  No obvious day to day or daytime variability or assoc excess/ purulent sputum or mucus plugs or hemoptysis or cp or chest tightness, subjective wheeze or overt sinus or hb symptoms. No unusual exp hx or h/o childhood pna/ asthma or knowledge of premature birth.  Sleeping ok on cpap without nocturnal  or early am exacerbation  of respiratory  c/o's or need for noct saba. Also denies any obvious fluctuation of symptoms with weather or environmental changes or other aggravating or alleviating factors except as outlined above   Current Allergies, Complete Past Medical History, Past Surgical History, Family History, and Social History were reviewed in [employer] record.  ROS  The following are not active complaints unless bolded sore throat, dysphagia, dental problems, itching, sneezing,  nasal congestion or disharge of excess mucus or purulent secretions, ear ache,   fever, chills, sweats, unintended wt loss or wt gain, classically pleuritic or exertional cp,  orthopnea pnd or leg swelling, presyncope, palpitations, abdominal pain, anorexia, nausea, vomiting, diarrhea  or change in bowel habits or bladder habits, change in stools or change in urine, dysuria, hematuria,  rash, arthralgias, visual complaints, headache, numbness,  weakness or  ataxia or problems with walking or coordination,  change in mood/affect or memory.        Current Meds  Medication Sig  . albuterol (PROVENTIL HFA;VENTOLIN HFA) 108 (90 Base) MCG/ACT inhaler Inhale 2 puffs into the lungs every 4 (four) hours as needed for wheezing or shortness of breath.  [name] albuterol (PROVENTIL) (2.5 MG/3ML) 0.083% nebulizer solution Take 3 mLs (2.5 mg total) by nebulization every 6 (six) hours as needed for wheezing or shortness of breath.  . allopurinol (ZYLOPRIM) 300 MG tablet Take 1 tablet (300 mg total) by mouth daily.  . budesonide-formoterol (SYMBICORT) 80-4.5 MCG/ACT inhaler Inhale 2 puffs into the lungs 2 (two) times daily.  . cloNIDine (CATAPRES) 0.1 MG tablet Take 1 tablet (0.1 mg total) by mouth 3 (three) times daily.  . colchicine 0.6 MG tablet Take 1 tablet (0.6 mg total) by mouth daily.  . hydrALAZINE (APRESOLINE) 50 MG tablet Take 50 mg by mouth 2 (two) times daily.  . NONFORMULARY OR COMPOUNDED ITEM Apply 1 drop topically daily. Nail Lacquer: Fluconazole 2% Terbinafine 1% DMSO  . OXYGEN Inhale 2 L into the lungs daily. 2lpm when needed per pt   . potassium chloride SA (K-DUR,KLOR-CON) 20 MEQ tablet Take 1.5 tablets (30 mEq total) by mouth daily.  [name] torsemide (DEMADEX) 20 MG tablet Take 2 tablets (40mg ) by mouth in the morning.  Take 1 tablet (20mg ) by mouth in the evening.  [name] UNABLE TO FIND Med Name: CPAP with sleep  . XARELTO 20 MG TABS tablet TAKE 1 TABLET BY MOUTH DAILY WITH SUPPER                      Objective:   Physical Exam    massively obese bm nad      12/16/2016        372  09/30/2016        358  07/21/2016        362  04/22/2016        374  01/21/2016      390  12/27/2015        387   09/10/2015       372   07/24/15 389 lb 6.4 oz (176.631 kg)  07/08/15 384 lb 3.2 oz (174.272 kg)  06/21/15 390 lb 8 oz (177.13 kg)    Vital signs reviewed -    - Note on arrival 02 sats  93% on RA    HEENT: nl dentition, turbinates, and  oropharynx. Nl external ear canals without cough reflex   NECK :  without JVD/Nodes/TM/ nl carotid upstrokes bilaterally   LUNGS: no acc muscle use,  Nl contour chest which is clear to A and P bilaterally without cough or wheezing on insp or exp maneuvers   CV:  RRR  no s3 or murmur or increase in P2, trace to 1+  sym bilateral lower ext edema   ABD:  soft and nontender with nl inspiratory excursion in the supine position. No bruits or organomegaly, bowel sounds nl  MS:  Nl gait/ ext warm without deformities, calf tenderness, cyanosis or clubbing No obvious joint restrictions   SKIN: warm and dry without lesions    NEURO:  alert, approp, nl sensorium with  no motor deficits              Assessment:

## 2016-12-16 NOTE — Assessment & Plan Note (Addendum)
Adequate control on present rx, reviewed in detail with pt > no change in rx needed  But needs regular int med f/u  Refer to [facility] internal medicine

## 2016-12-16 NOTE — Patient Instructions (Addendum)
Restart singulair 10 mg each pm    Please see patient coordinator before you leave today  to schedule Internal medicine/ [facility]  Change appt to Please schedule a follow up visit in 3 months but call sooner if needed

## 2016-12-18 ENCOUNTER — Encounter: Payer: Self-pay | Admitting: Internal Medicine

## 2016-12-18 NOTE — Assessment & Plan Note (Signed)
singulair maint rx as of 09/10/2015 with NO = 31 / no saba or other controller needed  - admit 12/14/15 with flare ? Assoc with chf/ cap - PFT's  01/21/2016  FEV1 2.11 (71 % ) ratio 84  p 12 % improvement from saba p breo 100 and on last day of pred taper  prior to study with DLCO  73/82  % corrects to 120 % for alv volume  - 01/21/2016    change to symbicort  160 2bid  - FENO 04/22/2016  =   16 on symb 160 with sub optimal technique and singuair > d/c'd singulair  - 04/22/2016    try symbicort 80 2bid - Allergy profile  09/30/16  >  Eos 0.0 /  IgE 7 RAST neg     - 09/30/2016  After extensive coaching HFA effectiveness =    90% - restart singulair 10 mg each am 12/16/2016 >>>  Based on hx did much better on singulair than off though clear on exam today > reasonable just to add it back at this point  I had an extended discussion with the patient reviewing all relevant studies completed to date and  lasting 15 to 20 minutes of a 25 minute visit    Each maintenance medication was reviewed in detail including most importantly the difference between maintenance and prns and under what circumstances the prns are to be triggered using an action plan format that is not reflected in the computer generated alphabetically organized AVS.    Please see AVS for specific instructions unique to this visit that I personally wrote and verbalized to the the pt in detail and then reviewed with pt  by my nurse highlighting any  changes in therapy recommended at today's visit to their plan of care.

## 2016-12-18 NOTE — Assessment & Plan Note (Signed)
ERV  01/21/2016  = 24% c/w body habitus   Body mass index is 54.15 kg/m.  -  trending  up Lab Results  Component Value Date   TSH 1.536 02/04/2016     Contributing to gerd risk/ doe/reviewed the need and the process to achieve and maintain neg calorie balance > defer f/u primary care including intermittently monitoring thyroid status

## 2016-12-20 ENCOUNTER — Encounter (HOSPITAL_COMMUNITY): Payer: Self-pay | Admitting: Emergency Medicine

## 2016-12-20 ENCOUNTER — Inpatient Hospital Stay (HOSPITAL_COMMUNITY)
Admission: EM | Admit: 2016-12-20 | Discharge: 2016-12-24 | DRG: 291 | Disposition: A | Discharge status: Home / Self Care | Payer: Managed Care, Other (non HMO) | Attending: Internal Medicine | Admitting: Internal Medicine

## 2016-12-20 ENCOUNTER — Emergency Department (HOSPITAL_COMMUNITY): Payer: Managed Care, Other (non HMO)

## 2016-12-20 DIAGNOSIS — I5031 Acute diastolic (congestive) heart failure: Secondary | ICD-10-CM | POA: Diagnosis present

## 2016-12-20 DIAGNOSIS — J449 Chronic obstructive pulmonary disease, unspecified: Secondary | ICD-10-CM

## 2016-12-20 DIAGNOSIS — J45901 Unspecified asthma with (acute) exacerbation: Secondary | ICD-10-CM | POA: Diagnosis not present

## 2016-12-20 DIAGNOSIS — I5033 Acute on chronic diastolic (congestive) heart failure: Secondary | ICD-10-CM | POA: Diagnosis present

## 2016-12-20 DIAGNOSIS — I480 Paroxysmal atrial fibrillation: Secondary | ICD-10-CM | POA: Diagnosis present

## 2016-12-20 DIAGNOSIS — N179 Acute kidney failure, unspecified: Secondary | ICD-10-CM

## 2016-12-20 DIAGNOSIS — R072 Precordial pain: Secondary | ICD-10-CM

## 2016-12-20 DIAGNOSIS — D649 Anemia, unspecified: Secondary | ICD-10-CM | POA: Diagnosis present

## 2016-12-20 DIAGNOSIS — Z7901 Long term (current) use of anticoagulants: Secondary | ICD-10-CM

## 2016-12-20 DIAGNOSIS — I5032 Chronic diastolic (congestive) heart failure: Secondary | ICD-10-CM | POA: Diagnosis present

## 2016-12-20 DIAGNOSIS — Z86711 Personal history of pulmonary embolism: Secondary | ICD-10-CM

## 2016-12-20 DIAGNOSIS — Z9981 Dependence on supplemental oxygen: Secondary | ICD-10-CM

## 2016-12-20 DIAGNOSIS — N183 Chronic kidney disease, stage 3 (moderate): Secondary | ICD-10-CM | POA: Diagnosis present

## 2016-12-20 DIAGNOSIS — Z79899 Other long term (current) drug therapy: Secondary | ICD-10-CM

## 2016-12-20 DIAGNOSIS — Z87891 Personal history of nicotine dependence: Secondary | ICD-10-CM

## 2016-12-20 DIAGNOSIS — G4733 Obstructive sleep apnea (adult) (pediatric): Secondary | ICD-10-CM | POA: Diagnosis present

## 2016-12-20 DIAGNOSIS — D696 Thrombocytopenia, unspecified: Secondary | ICD-10-CM | POA: Diagnosis present

## 2016-12-20 DIAGNOSIS — E876 Hypokalemia: Secondary | ICD-10-CM

## 2016-12-20 DIAGNOSIS — I130 Hypertensive heart and chronic kidney disease with heart failure and stage 1 through stage 4 chronic kidney disease, or unspecified chronic kidney disease: Secondary | ICD-10-CM | POA: Diagnosis not present

## 2016-12-20 DIAGNOSIS — M109 Gout, unspecified: Secondary | ICD-10-CM | POA: Diagnosis present

## 2016-12-20 DIAGNOSIS — Z6841 Body Mass Index (BMI) 40.0 and over, adult: Secondary | ICD-10-CM

## 2016-12-20 DIAGNOSIS — I509 Heart failure, unspecified: Secondary | ICD-10-CM

## 2016-12-20 DIAGNOSIS — Z8701 Personal history of pneumonia (recurrent): Secondary | ICD-10-CM

## 2016-12-20 LAB — BASIC METABOLIC PANEL
Anion gap: 8 (ref 5–15)
BUN: 27 mg/dL — ABNORMAL HIGH (ref 6–20)
CALCIUM: 9 mg/dL (ref 8.9–10.3)
CO2: 32 mmol/L (ref 22–32)
CREATININE: 2.16 mg/dL — AB (ref 0.61–1.24)
Chloride: 103 mmol/L (ref 101–111)
GFR calc non Af Amer: 33 mL/min — ABNORMAL LOW (ref >60)
GFR, EST AFRICAN AMERICAN: 38 mL/min — AB (ref >60)
Glucose, Bld: 84 mg/dL (ref 65–99)
Potassium: 3.6 mmol/L (ref 3.5–5.1)
SODIUM: 143 mmol/L (ref 135–145)

## 2016-12-20 LAB — TROPONIN I
TROPONIN I: 0.04 ng/mL — AB (ref <0.03)
Troponin I: 0.04 ng/mL (ref <0.03)
Troponin I: 0.05 ng/mL (ref <0.03)

## 2016-12-20 LAB — CBC
HCT: 35 % — ABNORMAL LOW (ref 39.0–52.0)
Hemoglobin: 10.9 g/dL — ABNORMAL LOW (ref 13.0–17.0)
MCH: 27.2 pg (ref 26.0–34.0)
MCHC: 31.1 g/dL (ref 30.0–36.0)
MCV: 87.3 fL (ref 78.0–100.0)
PLATELETS: 142 10*3/uL — AB (ref 150–400)
RBC: 4.01 MIL/uL — AB (ref 4.22–5.81)
RDW: 15.4 % (ref 11.5–15.5)
WBC: 4.3 10*3/uL (ref 4.0–10.5)

## 2016-12-20 LAB — POCT I-STAT TROPONIN I: TROPONIN I, POC: 0.05 ng/mL (ref 0.00–0.08)

## 2016-12-20 LAB — BRAIN NATRIURETIC PEPTIDE: B Natriuretic Peptide: 748 pg/mL — ABNORMAL HIGH (ref 0.0–100.0)

## 2016-12-20 MED ORDER — ASPIRIN 81 MG PO CHEW
324.0000 mg | CHEWABLE_TABLET | Freq: Once | ORAL | Status: AC
  Administered 2016-12-20: 324 mg via ORAL
  Filled 2016-12-20: qty 4

## 2016-12-20 MED ORDER — MONTELUKAST SODIUM 10 MG PO TABS
10.0000 mg | ORAL_TABLET | Freq: Every day | ORAL | Status: DC
  Administered 2016-12-20 – 2016-12-23 (×4): 10 mg via ORAL
  Filled 2016-12-20 (×4): qty 1

## 2016-12-20 MED ORDER — CLONIDINE HCL 0.1 MG PO TABS
0.1000 mg | ORAL_TABLET | Freq: Three times a day (TID) | ORAL | Status: DC
  Administered 2016-12-20 – 2016-12-24 (×12): 0.1 mg via ORAL
  Filled 2016-12-20 (×12): qty 1

## 2016-12-20 MED ORDER — ALBUTEROL SULFATE (2.5 MG/3ML) 0.083% IN NEBU
2.5000 mg | INHALATION_SOLUTION | Freq: Four times a day (QID) | RESPIRATORY_TRACT | Status: DC | PRN
  Administered 2016-12-20: 2.5 mg via RESPIRATORY_TRACT
  Filled 2016-12-20 (×2): qty 3

## 2016-12-20 MED ORDER — FUROSEMIDE 10 MG/ML IJ SOLN
80.0000 mg | Freq: Two times a day (BID) | INTRAMUSCULAR | Status: AC
  Administered 2016-12-21 (×2): 80 mg via INTRAVENOUS
  Filled 2016-12-20 (×2): qty 8

## 2016-12-20 MED ORDER — ALBUTEROL SULFATE HFA 108 (90 BASE) MCG/ACT IN AERS
2.0000 | INHALATION_SPRAY | RESPIRATORY_TRACT | Status: DC | PRN
  Filled 2016-12-20: qty 6.7

## 2016-12-20 MED ORDER — NITROGLYCERIN 2 % TD OINT
1.0000 [in_us] | TOPICAL_OINTMENT | Freq: Once | TRANSDERMAL | Status: AC
  Administered 2016-12-20: 1 [in_us] via TOPICAL
  Filled 2016-12-20: qty 1

## 2016-12-20 MED ORDER — RIVAROXABAN 20 MG PO TABS
20.0000 mg | ORAL_TABLET | Freq: Every day | ORAL | Status: DC
  Administered 2016-12-20 – 2016-12-24 (×5): 20 mg via ORAL
  Filled 2016-12-20 (×5): qty 1

## 2016-12-20 MED ORDER — POTASSIUM CHLORIDE CRYS ER 20 MEQ PO TBCR
30.0000 meq | EXTENDED_RELEASE_TABLET | Freq: Every day | ORAL | Status: DC
  Administered 2016-12-21 – 2016-12-24 (×4): 30 meq via ORAL
  Filled 2016-12-20 (×4): qty 1

## 2016-12-20 MED ORDER — SODIUM CHLORIDE 0.9% FLUSH
3.0000 mL | Freq: Two times a day (BID) | INTRAVENOUS | Status: DC
  Administered 2016-12-20 – 2016-12-24 (×8): 3 mL via INTRAVENOUS

## 2016-12-20 MED ORDER — SODIUM CHLORIDE 0.9% FLUSH: 3.0000 mL | INTRAVENOUS | Status: DC | PRN

## 2016-12-20 MED ORDER — MOMETASONE FURO-FORMOTEROL FUM 100-5 MCG/ACT IN AERO
2.0000 | INHALATION_SPRAY | Freq: Two times a day (BID) | RESPIRATORY_TRACT | Status: DC
  Administered 2016-12-21 – 2016-12-24 (×7): 2 via RESPIRATORY_TRACT
  Filled 2016-12-20: qty 8.8

## 2016-12-20 MED ORDER — FUROSEMIDE 10 MG/ML IJ SOLN
60.0000 mg | Freq: Once | INTRAMUSCULAR | Status: AC
  Administered 2016-12-20: 60 mg via INTRAVENOUS
  Filled 2016-12-20: qty 8

## 2016-12-20 MED ORDER — CARVEDILOL 3.125 MG PO TABS
3.1250 mg | ORAL_TABLET | Freq: Two times a day (BID) | ORAL | Status: DC
  Administered 2016-12-21 – 2016-12-24 (×8): 3.125 mg via ORAL
  Filled 2016-12-20 (×9): qty 1

## 2016-12-20 MED ORDER — HYDRALAZINE HCL 50 MG PO TABS
50.0000 mg | ORAL_TABLET | Freq: Two times a day (BID) | ORAL | Status: DC
  Administered 2016-12-20 – 2016-12-24 (×8): 50 mg via ORAL
  Filled 2016-12-20 (×8): qty 1

## 2016-12-20 MED ORDER — SODIUM CHLORIDE 0.9 % IV SOLN: 250.0000 mL | INTRAVENOUS | Status: DC | PRN

## 2016-12-20 MED ORDER — ALBUTEROL SULFATE (2.5 MG/3ML) 0.083% IN NEBU
5.0000 mg | INHALATION_SOLUTION | Freq: Once | RESPIRATORY_TRACT | Status: AC
  Administered 2016-12-20: 5 mg via RESPIRATORY_TRACT
  Filled 2016-12-20: qty 6

## 2016-12-20 MED ORDER — HEPARIN SODIUM (PORCINE) 5000 UNIT/ML IJ SOLN: 5000.0000 [IU] | Freq: Three times a day (TID) | INTRAMUSCULAR | Status: DC

## 2016-12-20 MED ORDER — ACETAMINOPHEN 325 MG PO TABS: 650.0000 mg | ORAL_TABLET | ORAL | Status: DC | PRN

## 2016-12-20 MED ORDER — ONDANSETRON HCL 4 MG/2ML IJ SOLN: 4.0000 mg | Freq: Four times a day (QID) | INTRAMUSCULAR | Status: DC | PRN

## 2016-12-20 NOTE — ED Notes (Signed)
Bed: FP84 Expected date:  Expected time:  Means of arrival:  Comments: Triage 3

## 2016-12-20 NOTE — H&P (Signed)
History and Physical    Phillip [name] PJK:[id number] DOB: [birth year] DOA: 12/20/2016  Referring MD/NP/PA: EDP PCP: [name], MD  Outpatient Specialists:Cardiologist, Dr Daneen Schick. Patient coming from: home  Chief Complaint: shortness of breath HPI: Phillip [name] is a 56 y.o. male with medical history significant for but not limited to CHF, sleep apnea, reactive airway disease presenting with 2 week history of progressively worsening shortness of breath on exertion with any fever or chills, no sputum production. No chest pain. He had been treated recently by his pulmonologist for community acquired pneumonia  ED Course: at the ED patient was noted to have some wheezing with significant desaturation on exertion. X-ray showed vascular congestion with elevated B natruretic peptide of 748. Patient admitted for acute CHF  Review of Systems: As per HPI otherwise 10 point review of systems negative.   Past Medical History:  Diagnosis Date  . CHF (congestive heart failure) ([place]) 11/15   EF 40-45%, improved with follow-up  . Gout   . Hypertension   . Hypertensive cardiovascular disease   . Morbid obesity ([place])   . Obstructive sleep apnea    complaint with CPAP  . OSA on CPAP   . Paroxysmal atrial fibrillation (HCC)   . PNA (pneumonia)   . Pulmonary embolism [facility])     Past Surgical History:  Procedure Laterality Date  . [place]     reports that he quit smoking about 27 years ago. His smoking use included Cigarettes. He has a 0.25 pack-year smoking history. He has never used smokeless tobacco. He reports that he does not drink alcohol or use drugs.  No Known Allergies  Family History  Problem Relation Age of Onset  . Diabetes Father   . CAD Father   . Hypertension Other   . Diabetes Other   . Allergies Brother     Prior to Admission medications   Medication Sig Start Date End Date Taking? Authorizing Provider  albuterol (PROVENTIL HFA;VENTOLIN HFA) 108  (90 Base) MCG/ACT inhaler Inhale 2 puffs into the lungs every 4 (four) hours as needed for wheezing or shortness of breath. 12/01/16  Yes Tanda Rockers, MD  albuterol (PROVENTIL) (2.5 MG/3ML) 0.083% nebulizer solution Take 3 mLs (2.5 mg total) by nebulization every 6 (six) hours as needed for wheezing or shortness of breath. 09/12/16  Yes Tanda Rockers, MD  allopurinol (ZYLOPRIM) 300 MG tablet Take 1 tablet (300 mg total) by mouth daily. 07/07/16  Yes Edrick Kins, DPM  budesonide-formoterol (SYMBICORT) 80-4.5 MCG/ACT inhaler Inhale 2 puffs into the lungs 2 (two) times daily. 07/21/16  Yes Tanda Rockers, MD  cloNIDine (CATAPRES) 0.1 MG tablet Take 1 tablet (0.1 mg total) by mouth 3 (three) times daily. 12/16/15  Yes Short, Noah Delaine, MD  colchicine 0.6 MG tablet Take 1 tablet (0.6 mg total) by mouth daily. Patient taking differently: Take 0.6 mg by mouth daily as needed (gout).  07/07/16  Yes Edrick Kins, DPM  hydrALAZINE (APRESOLINE) 50 MG tablet Take 50 mg by mouth 2 (two) times daily.   Yes [provider]  montelukast (SINGULAIR) 10 MG tablet Take 1 tablet (10 mg total) by mouth at bedtime. 12/16/16  Yes Tanda Rockers, MD  naproxen sodium (ANAPROX) 220 MG tablet Take 220 mg by mouth 2 (two) times daily as needed (pain).   Yes [provider]  NONFORMULARY OR COMPOUNDED ITEM Apply 1 drop topically daily. Nail Lacquer: Fluconazole 2% Terbinafine 1% DMSO 05/14/16  Yes Edrick Kins, DPM  OXYGEN Inhale 2 L into the lungs daily. 2lpm when needed per pt    Yes [provider]  potassium chloride SA (K-DUR,KLOR-CON) 20 MEQ tablet Take 1.5 tablets (30 mEq total) by mouth daily. Patient taking differently: Take 20 mEq by mouth daily.  10/17/16  Yes Belva Crome, MD  torsemide (DEMADEX) 20 MG tablet Take 2 tablets (40mg ) by mouth in the morning.  Take 1 tablet (20mg ) by mouth in the evening. 10/02/16  Yes Belva Crome, MD  UNABLE TO FIND Med Name: CPAP with sleep   Yes  [provider]  XARELTO 20 MG TABS tablet TAKE 1 TABLET BY MOUTH DAILY WITH SUPPER 09/10/16  Yes Belva Crome, MD    Physical Exam: Vitals:   12/20/16 0845 12/20/16 0900 12/20/16 0915 12/20/16 0930  BP:    (!) 157/62  Pulse: 77 72 74 74  Resp: (!) 22 (!) 22 19 (!) 21  Temp:      TempSrc:      SpO2: 96% 97% 97% 96%  Weight:      Height:          Constitutional: NAD, calm, comfortable Vitals:   12/20/16 0845 12/20/16 0900 12/20/16 0915 12/20/16 0930  BP:    (!) 157/62  Pulse: 77 72 74 74  Resp: (!) 22 (!) 22 19 (!) 21  Temp:      TempSrc:      SpO2: 96% 97% 97% 96%  Weight:      Height:       Eyes: PERRL, lids and conjunctivae normal ENMT: Mucous membranes are moist. Posterior pharynx clear of any exudate or lesions.Normal dentition.  Neck: normal, supple, no masses, no thyromegaly Respiratory: diminished breath sounds at the bases with bilateral wheezing.  Cardiovascular: Regular rate and rhythm, no murmurs / rubs / gallops. 2+ bipedal pitting edema. 2+ pedal pulses. No carotid bruits.  Abdomen: no tenderness, no masses palpated. No hepatosplenomegaly. Bowel sounds positive.  Musculoskeletal: no clubbing / cyanosis. No joint deformity upper and lower extremities. Good ROM, no contractures. Normal muscle tone.  Skin: no rashes, lesions, ulcers. No induration Neurologic: CN 2-12 grossly intact. Sensation intact, DTR normal. Strength 5/5 in all 4.  Psychiatric: Normal judgment and insight. Alert and oriented x 3. Normal mood.     Labs on Admission: I have personally reviewed following labs and imaging studies  CBC:  Recent Labs Lab 12/20/16 0301  WBC 4.3  HGB 10.9*  HCT 35.0*  MCV 87.3  PLT 161*   Basic Metabolic Panel:  Recent Labs Lab 12/20/16 0301  NA 143  K 3.6  CL 103  CO2 32  GLUCOSE 84  BUN 27*  CREATININE 2.16*  CALCIUM 9.0   GFR: Estimated Creatinine Clearance: 61 mL/min (A) (by C-G formula based on SCr of 2.16 mg/dL (H)). Liver  Function Tests: No results for input(s): AST, ALT, ALKPHOS, BILITOT, PROT, ALBUMIN in the last 168 hours. No results for input(s): LIPASE, AMYLASE in the last 168 hours. No results for input(s): AMMONIA in the last 168 hours. Coagulation Profile: No results for input(s): INR, PROTIME in the last 168 hours. Cardiac Enzymes: No results for input(s): CKTOTAL, CKMB, CKMBINDEX, TROPONINI in the last 168 hours. BNP (last 3 results)  Recent Labs  09/30/16 1452  PROBNP 877.0*   HbA1C: No results for input(s): HGBA1C in the last 72 hours. CBG: No results for input(s): GLUCAP in the last 168 hours. Lipid Profile: No results  for input(s): CHOL, HDL, LDLCALC, TRIG, CHOLHDL, LDLDIRECT in the last 72 hours. Thyroid Function Tests: No results for input(s): TSH, T4TOTAL, FREET4, T3FREE, THYROIDAB in the last 72 hours. Anemia Panel: No results for input(s): VITAMINB12, FOLATE, FERRITIN, TIBC, IRON, RETICCTPCT in the last 72 hours. Urine analysis:    Component Value Date/Time   COLORURINE YELLOW 02/04/2016 [place] 02/04/2016 1234   LABSPEC 1.011 02/04/2016 1234   PHURINE 7.5 02/04/2016 1234   GLUCOSEU NEGATIVE 02/04/2016 1234   HGBUR NEGATIVE 02/04/2016 1234   BILIRUBINUR NEGATIVE 02/04/2016 1234   KETONESUR NEGATIVE 02/04/2016 1234   PROTEINUR NEGATIVE 02/04/2016 1234   UROBILINOGEN 1.0 12/04/2014 0546   NITRITE NEGATIVE 02/04/2016 1234   LEUKOCYTESUR NEGATIVE 02/04/2016 1234   Sepsis Labs: @LABRCNTIP (procalcitonin:4,lacticidven:4) )No results found for this or any previous visit (from the past 240 hour(s)).   Radiological Exams on Admission: Dg Chest 2 View  Result Date: 12/20/2016 CLINICAL DATA:  Dyspnea and chest pressure x2 days. 5 pound weight gain in 1 day. EXAM: CHEST  2 VIEW COMPARISON:  09/30/2016 FINDINGS: Stable cardiomegaly. Uncoiling of the thoracic aorta without aneurysm. Central vascular congestion consistent mild CHF. Subpleural atelectasis and/or  scarring in the left upper lobe. No acute osseous abnormality. No pleural effusion. IMPRESSION: Cardiomegaly with mild central vascular congestion consistent with CHF. Electronically Signed   By: Ashley Royalty M.D.   On: 12/20/2016 03:28    EKG: Independently reviewed.   Assessment/Plan Active Problems:   Asthma with acute exacerbation   Acute on chronic diastolic CHF (congestive heart failure) (HCC)   #1 acute CHF: Chest x-ray with edema without airspace disease Elevated BNP of more than 700 Exacerbating factor being possible recent community acquired pneumonia IV diuresis with monitoring of electrolytes 2-D echocardiogram ordered Follow BNP  #2 bronchial asthma: Bronchodilator nebulizations when necessary  #3 normocytic anemia with thrombocytopenia: Mild Outpatient follow-up    DVT prophylaxis: SCD Code Status: (Full) Family Communication:patient Disposition Plan: anticipate discharged tomorrow on home oxygen at baseline Consults called: none Admission status: obs / tele    OSEI-BONSU,Delonta Yohannes MD [facility] Pager [phone]  If 7PM-7AM, please contact night-coverage [email or web address] Password [email or web address]  12/20/2016, 10:19 AM

## 2016-12-20 NOTE — ED Triage Notes (Signed)
Pt states he has CHF and has been feeling short of breath for the past 2 weeks  Pt states it has been getting worse  Pt states his doctor told him to increase his tenormin and he has but it is not helping  Pt states he has used his inhaler and his neb treatment without relief Pt states he also has some chest pressure that is different from his normal feeling

## 2016-12-20 NOTE — ED Notes (Signed)
Please call Sallie-RN [phone] for report.

## 2016-12-20 NOTE — ED Notes (Signed)
Lab will add on BNP  °

## 2016-12-21 ENCOUNTER — Observation Stay (HOSPITAL_BASED_OUTPATIENT_CLINIC_OR_DEPARTMENT_OTHER): Payer: Managed Care, Other (non HMO)

## 2016-12-21 DIAGNOSIS — I5031 Acute diastolic (congestive) heart failure: Secondary | ICD-10-CM | POA: Diagnosis not present

## 2016-12-21 DIAGNOSIS — I350 Nonrheumatic aortic (valve) stenosis: Secondary | ICD-10-CM | POA: Diagnosis not present

## 2016-12-21 DIAGNOSIS — I5033 Acute on chronic diastolic (congestive) heart failure: Secondary | ICD-10-CM | POA: Diagnosis not present

## 2016-12-21 DIAGNOSIS — R072 Precordial pain: Secondary | ICD-10-CM | POA: Diagnosis not present

## 2016-12-21 LAB — BASIC METABOLIC PANEL
ANION GAP: 9 (ref 5–15)
BUN: 25 mg/dL — AB (ref 6–20)
CALCIUM: 9.1 mg/dL (ref 8.9–10.3)
CO2: 30 mmol/L (ref 22–32)
Chloride: 106 mmol/L (ref 101–111)
Creatinine, Ser: 1.89 mg/dL — ABNORMAL HIGH (ref 0.61–1.24)
GFR calc Af Amer: 45 mL/min — ABNORMAL LOW (ref >60)
GFR, EST NON AFRICAN AMERICAN: 39 mL/min — AB (ref >60)
GLUCOSE: 95 mg/dL (ref 65–99)
Potassium: 3.4 mmol/L — ABNORMAL LOW (ref 3.5–5.1)
SODIUM: 145 mmol/L (ref 135–145)

## 2016-12-21 LAB — HIV ANTIBODY (ROUTINE TESTING W REFLEX): HIV SCREEN 4TH GENERATION: NONREACTIVE

## 2016-12-21 LAB — ECHOCARDIOGRAM COMPLETE
HEIGHTINCHES: 69.5 in
WEIGHTICAEL: 5908.8 [oz_av]

## 2016-12-21 LAB — BRAIN NATRIURETIC PEPTIDE: B Natriuretic Peptide: 1091.6 pg/mL — ABNORMAL HIGH (ref 0.0–100.0)

## 2016-12-21 LAB — TSH: TSH: 1.499 u[IU]/mL (ref 0.350–4.500)

## 2016-12-21 MED ORDER — PERFLUTREN LIPID MICROSPHERE
1.0000 mL | INTRAVENOUS | Status: AC | PRN
  Administered 2016-12-21: 9 mL via INTRAVENOUS
  Filled 2016-12-21: qty 10

## 2016-12-21 NOTE — Progress Notes (Signed)
PROGRESS NOTE    Phillip [name]  GXQ:[id number] DOB: 22-May-1960 DOA: 12/20/2016 PCP: [name], MD    Brief Narrative:  56 y.o. male with medical history significant for but not limited to CHF, sleep apnea, reactive airway disease presenting with 2 week history of progressively worsening shortness of breath on exertion with any fever or chills, no sputum production. No chest pain. He had been treated recently by his pulmonologist for community acquired pneumonia  Assessment & Plan:   Active Problems:   Asthma with acute exacerbation   Acute on chronic diastolic CHF (congestive heart failure) (HCC)   Acute CHF (congestive heart failure) ([place])  #1 acute on chronic diastolic CHF: Chest x-ray with edema without airspace disease Elevated BNP of more than 700 Exacerbating factor being possible recent community acquired pneumonia Patient is continued on BID IV lasix with over 3L urine output thus far today 2-D echocardiogram done and reviewed with normal LVEF, grade 2 diastolic dysfunction Repeat bmet in AM  #2 bronchial asthma: Bronchodilator nebulizations when necessary No audible wheezing at this time  #3 normocytic anemia with thrombocytopenia: Mild Outpatient follow-up Stable at present  DVT prophylaxis: SCD's Code Status: Full Family Communication: Pt in room, family not at bedside Disposition Plan: Uncertain at this time  Consultants:     Procedures:     Antimicrobials: Anti-infectives    None       Subjective: Reports feeling better today  Objective: Vitals:   12/20/16 2252 12/21/16 0613 12/21/16 0759 12/21/16 1405  BP:  (!) 162/80    Pulse:  74 87 71  Resp:  18 20 20   Temp:  97.7 F (36.5 C)  98.3 F (36.8 C)  TempSrc:  Axillary  Oral  SpO2: 98% 97% 95% 93%  Weight:  (!) 167.5 kg (369 lb 4.8 oz)    Height:        Intake/Output Summary (Last 24 hours) at 12/21/16 1716 Last data filed at 12/21/16 1406  Gross per 24 hour  Intake              1200 ml  Output             3775 ml  Net            -2575 ml   Filed Weights   12/20/16 0255 12/20/16 2003 12/21/16 0613  Weight: (!) 167.8 kg (370 lb) (!) 167.6 kg (369 lb 6.4 oz) (!) 167.5 kg (369 lb 4.8 oz)    Examination:  General exam: Appears calm and comfortable  Respiratory system: Clear to auscultation. Respiratory effort normal. Cardiovascular system: S1 & S2 heard, RRR. Gastrointestinal system: Abdomen is nondistended, soft and nontender. No organomegaly or masses felt. Normal bowel sounds heard. Central nervous system: Alert and oriented. No focal neurological deficits. Extremities: Symmetric 5 x 5 power. Skin: No rashes, lesions  Psychiatry: Judgement and insight appear normal. Mood & affect appropriate.   Data Reviewed: I have personally reviewed following labs and imaging studies  CBC:  Recent Labs Lab 12/20/16 0301  WBC 4.3  HGB 10.9*  HCT 35.0*  MCV 87.3  PLT 144*   Basic Metabolic Panel:  Recent Labs Lab 12/20/16 0301 12/21/16 0634  NA 143 145  K 3.6 3.4*  CL 103 106  CO2 32 30  GLUCOSE 84 95  BUN 27* 25*  CREATININE 2.16* 1.89*  CALCIUM 9.0 9.1   GFR: Estimated Creatinine Clearance: 69.6 mL/min (A) (by C-G formula based on SCr of 1.89 mg/dL (H)). Liver Function  Tests: No results for input(s): AST, ALT, ALKPHOS, BILITOT, PROT, ALBUMIN in the last 168 hours. No results for input(s): LIPASE, AMYLASE in the last 168 hours. No results for input(s): AMMONIA in the last 168 hours. Coagulation Profile: No results for input(s): INR, PROTIME in the last 168 hours. Cardiac Enzymes:  Recent Labs Lab 12/20/16 1034 12/20/16 1719 12/20/16 2207  TROPONINI 0.04* 0.04* 0.05*   BNP (last 3 results)  Recent Labs  09/30/16 1452  PROBNP 877.0*   HbA1C: No results for input(s): HGBA1C in the last 72 hours. CBG: No results for input(s): GLUCAP in the last 168 hours. Lipid Profile: No results for input(s): CHOL, HDL, LDLCALC, TRIG, CHOLHDL,  LDLDIRECT in the last 72 hours. Thyroid Function Tests:  Recent Labs  12/21/16 0634  TSH 1.499   Anemia Panel: No results for input(s): VITAMINB12, FOLATE, FERRITIN, TIBC, IRON, RETICCTPCT in the last 72 hours. Sepsis Labs: No results for input(s): PROCALCITON, LATICACIDVEN in the last 168 hours.  No results found for this or any previous visit (from the past 240 hour(s)).   Radiology Studies: Dg Chest 2 View  Result Date: 12/20/2016 CLINICAL DATA:  Dyspnea and chest pressure x2 days. 5 pound weight gain in 1 day. EXAM: CHEST  2 VIEW COMPARISON:  09/30/2016 FINDINGS: Stable cardiomegaly. Uncoiling of the thoracic aorta without aneurysm. Central vascular congestion consistent mild CHF. Subpleural atelectasis and/or scarring in the left upper lobe. No acute osseous abnormality. No pleural effusion. IMPRESSION: Cardiomegaly with mild central vascular congestion consistent with CHF. Electronically Signed   By: Ashley Royalty M.D.   On: 12/20/2016 03:28    Scheduled Meds: . carvedilol  3.125 mg Oral BID WC  . cloNIDine  0.1 mg Oral TID  . furosemide  80 mg Intravenous Q12H  . hydrALAZINE  50 mg Oral BID  . mometasone-formoterol  2 puff Inhalation BID  . montelukast  10 mg Oral QHS  . potassium chloride SA  30 mEq Oral Daily  . rivaroxaban  20 mg Oral Q supper  . sodium chloride flush  3 mL Intravenous Q12H   Continuous Infusions: . sodium chloride       LOS: 0 days   Michaelanthony Kempton, Orpah Melter, MD [facility] Pager [phone]  If 7PM-7AM, please contact night-coverage [email or web address] Password [email or web address] 12/21/2016, 5:16 PM

## 2016-12-21 NOTE — Progress Notes (Signed)
  Echocardiogram 2D Echocardiogram with definity has been performed.  Phillip [name] M 12/21/2016, 9:51 AM

## 2016-12-22 DIAGNOSIS — Z8701 Personal history of pneumonia (recurrent): Secondary | ICD-10-CM | POA: Diagnosis not present

## 2016-12-22 DIAGNOSIS — I130 Hypertensive heart and chronic kidney disease with heart failure and stage 1 through stage 4 chronic kidney disease, or unspecified chronic kidney disease: Secondary | ICD-10-CM | POA: Diagnosis present

## 2016-12-22 DIAGNOSIS — Z79899 Other long term (current) drug therapy: Secondary | ICD-10-CM | POA: Diagnosis not present

## 2016-12-22 DIAGNOSIS — J449 Chronic obstructive pulmonary disease, unspecified: Secondary | ICD-10-CM | POA: Diagnosis present

## 2016-12-22 DIAGNOSIS — R072 Precordial pain: Secondary | ICD-10-CM | POA: Diagnosis not present

## 2016-12-22 DIAGNOSIS — E876 Hypokalemia: Secondary | ICD-10-CM | POA: Diagnosis not present

## 2016-12-22 DIAGNOSIS — J45901 Unspecified asthma with (acute) exacerbation: Secondary | ICD-10-CM | POA: Diagnosis present

## 2016-12-22 DIAGNOSIS — D696 Thrombocytopenia, unspecified: Secondary | ICD-10-CM | POA: Diagnosis present

## 2016-12-22 DIAGNOSIS — Z86711 Personal history of pulmonary embolism: Secondary | ICD-10-CM | POA: Diagnosis not present

## 2016-12-22 DIAGNOSIS — Z7901 Long term (current) use of anticoagulants: Secondary | ICD-10-CM | POA: Diagnosis not present

## 2016-12-22 DIAGNOSIS — Z6841 Body Mass Index (BMI) 40.0 and over, adult: Secondary | ICD-10-CM | POA: Diagnosis not present

## 2016-12-22 DIAGNOSIS — Z87891 Personal history of nicotine dependence: Secondary | ICD-10-CM | POA: Diagnosis not present

## 2016-12-22 DIAGNOSIS — I5031 Acute diastolic (congestive) heart failure: Secondary | ICD-10-CM | POA: Diagnosis not present

## 2016-12-22 DIAGNOSIS — D649 Anemia, unspecified: Secondary | ICD-10-CM | POA: Diagnosis present

## 2016-12-22 DIAGNOSIS — G4733 Obstructive sleep apnea (adult) (pediatric): Secondary | ICD-10-CM | POA: Diagnosis present

## 2016-12-22 DIAGNOSIS — Z9989 Dependence on other enabling machines and devices: Secondary | ICD-10-CM | POA: Diagnosis not present

## 2016-12-22 DIAGNOSIS — M109 Gout, unspecified: Secondary | ICD-10-CM | POA: Diagnosis present

## 2016-12-22 DIAGNOSIS — I480 Paroxysmal atrial fibrillation: Secondary | ICD-10-CM | POA: Diagnosis present

## 2016-12-22 DIAGNOSIS — I5033 Acute on chronic diastolic (congestive) heart failure: Secondary | ICD-10-CM | POA: Diagnosis present

## 2016-12-22 DIAGNOSIS — N179 Acute kidney failure, unspecified: Secondary | ICD-10-CM | POA: Diagnosis not present

## 2016-12-22 DIAGNOSIS — N183 Chronic kidney disease, stage 3 (moderate): Secondary | ICD-10-CM | POA: Diagnosis present

## 2016-12-22 DIAGNOSIS — Z9981 Dependence on supplemental oxygen: Secondary | ICD-10-CM | POA: Diagnosis not present

## 2016-12-22 LAB — BASIC METABOLIC PANEL
ANION GAP: 6 (ref 5–15)
BUN: 25 mg/dL — AB (ref 6–20)
CHLORIDE: 104 mmol/L (ref 101–111)
CO2: 34 mmol/L — ABNORMAL HIGH (ref 22–32)
Calcium: 8.8 mg/dL — ABNORMAL LOW (ref 8.9–10.3)
Creatinine, Ser: 2 mg/dL — ABNORMAL HIGH (ref 0.61–1.24)
GFR calc Af Amer: 42 mL/min — ABNORMAL LOW (ref >60)
GFR calc non Af Amer: 36 mL/min — ABNORMAL LOW (ref >60)
Glucose, Bld: 92 mg/dL (ref 65–99)
POTASSIUM: 3.6 mmol/L (ref 3.5–5.1)
SODIUM: 144 mmol/L (ref 135–145)

## 2016-12-22 MED ORDER — POLYETHYLENE GLYCOL 3350 17 G PO PACK
17.0000 g | PACK | Freq: Every day | ORAL | Status: DC
  Administered 2016-12-22 – 2016-12-24 (×3): 17 g via ORAL
  Filled 2016-12-22 (×3): qty 1

## 2016-12-22 MED ORDER — DOCUSATE SODIUM 100 MG PO CAPS
100.0000 mg | ORAL_CAPSULE | Freq: Two times a day (BID) | ORAL | Status: DC
  Administered 2016-12-22 – 2016-12-24 (×5): 100 mg via ORAL
  Filled 2016-12-22 (×5): qty 1

## 2016-12-22 MED ORDER — FUROSEMIDE 10 MG/ML IJ SOLN
60.0000 mg | Freq: Two times a day (BID) | INTRAMUSCULAR | Status: DC
  Administered 2016-12-22 – 2016-12-23 (×4): 60 mg via INTRAVENOUS
  Filled 2016-12-22 (×4): qty 6

## 2016-12-22 NOTE — Care Management Note (Signed)
Case Management Note  Patient Details  Name: [name] MRN: [id number] Date of Birth: [birth year]  Subjective/Objective:56 y/o m admitted w/CHF. From home. CM referral for CHF protocal-1 adm/79months;3 ed visits/39months. On 02-will monitor.                    Action/Plan:d/c home.   Expected Discharge Date:                  Expected Discharge Plan:  Home/Self Care  In-House Referral:     Discharge planning Services  CM Consult  Post Acute Care Choice:    Choice offered to:     DME Arranged:    DME Agency:     HH Arranged:    HH Agency:     Status of Service:  In process, will continue to follow  If discussed at Long Length of Stay Meetings, dates discussed:    Additional Comments:  Dessa Phi, RN 12/22/2016, 11:38 AM

## 2016-12-22 NOTE — Progress Notes (Signed)
PROGRESS NOTE    Phillip [name]  [id number] DOB: [birth year] DOA: 12/20/2016 PCP: [name], MD    Brief Narrative:  56 y.o. male with medical history significant for but not limited to CHF, sleep apnea, reactive airway disease presenting with 2 week history of progressively worsening shortness of breath on exertion with any fever or chills, no sputum production. No chest pain. He had been treated recently by his pulmonologist for community acquired pneumonia  Assessment & Plan:   Active Problems:   Asthma with acute exacerbation   Acute on chronic diastolic CHF (congestive heart failure) (H[facility])   Acute CHF (congestive heart failure) ([facility])   Acute diastolic (congestive) heart failure ([place])  #1 acute on chronic diastolic CHF: Chest x-ray with edema without airspace disease Elevated BNP of more than 700 Exacerbating factor being possible recent community acquired pneumonia Patient is continued on BID IV lasix with good urine output thus far 2-D echocardiogram done and reviewed with normal LVEF, grade 2 diastolic dysfunction Will repeat bmet in AM  #2 bronchial asthma: Bronchodilator nebulizations when necessary Without wheezing at this time  #3 normocytic anemia with thrombocytopenia: Mild  Outpatient follow-up Remains stable at present  DVT prophylaxis: SCD's Code Status: Full Family Communication: Pt in room, family not at bedside Disposition Plan: Uncertain at this time  Consultants:     Procedures:     Antimicrobials: Anti-infectives    None      Subjective: Still feeling congested  Objective: Vitals:   12/21/16 2137 12/21/16 2310 12/22/16 0604 12/22/16 0812  BP: (!) 114/55  (!) 152/74   Pulse: 73  65   Resp: 20 18 20    Temp: 98.5 F (36.9 C)  97.9 F (36.6 C)   TempSrc: Oral  Axillary   SpO2: 100%  94% 95%  Weight:   (!) 166.6 kg (367 lb 4.8 oz)   Height:        Intake/Output Summary (Last 24 hours) at 12/22/16 1333 Last data filed at  12/22/16 1151  Gross per 24 hour  Intake              480 ml  Output             2025 ml  Net            -1545 ml   Filed Weights   12/20/16 2003 12/21/16 0613 12/22/16 0604  Weight: (!) 167.6 kg (369 lb 6.4 oz) (!) 167.5 kg (369 lb 4.8 oz) (!) 166.6 kg (367 lb 4.8 oz)    Examination: General exam: Awake, laying in bed, in nad Respiratory system: Normal respiratory effort, no wheezing Cardiovascular system: regular rate, s1, s2 Gastrointestinal system: Soft, nondistended, positive BS Central nervous system: CN2-12 grossly intact, strength intact Extremities: Perfused, no clubbing Skin: Normal skin turgor, no notable skin lesions seen Psychiatry: Mood normal // no visual hallucinations   Data Reviewed: I have personally reviewed following labs and imaging studies  CBC:  Recent Labs Lab 12/20/16 0301  WBC 4.3  HGB 10.9*  HCT 35.0*  MCV 87.3  PLT 268*   Basic Metabolic Panel:  Recent Labs Lab 12/20/16 0301 12/21/16 0634 12/22/16 0534  NA 143 145 144  K 3.6 3.4* 3.6  CL 103 106 104  CO2 32 30 34*  GLUCOSE 84 95 92  BUN 27* 25* 25*  CREATININE 2.16* 1.89* 2.00*  CALCIUM 9.0 9.1 8.8*   GFR: Estimated Creatinine Clearance: 65.6 mL/min (A) (by C-G formula based on SCr of 2  mg/dL (H)). Liver Function Tests: No results for input(s): AST, ALT, ALKPHOS, BILITOT, PROT, ALBUMIN in the last 168 hours. No results for input(s): LIPASE, AMYLASE in the last 168 hours. No results for input(s): AMMONIA in the last 168 hours. Coagulation Profile: No results for input(s): INR, PROTIME in the last 168 hours. Cardiac Enzymes:  Recent Labs Lab 12/20/16 1034 12/20/16 1719 12/20/16 2207  TROPONINI 0.04* 0.04* 0.05*   BNP (last 3 results)  Recent Labs  09/30/16 1452  PROBNP 877.0*   HbA1C: No results for input(s): HGBA1C in the last 72 hours. CBG: No results for input(s): GLUCAP in the last 168 hours. Lipid Profile: No results for input(s): CHOL, HDL, LDLCALC, TRIG,  CHOLHDL, LDLDIRECT in the last 72 hours. Thyroid Function Tests:  Recent Labs  12/21/16 0634  TSH 1.499   Anemia Panel: No results for input(s): VITAMINB12, FOLATE, FERRITIN, TIBC, IRON, RETICCTPCT in the last 72 hours. Sepsis Labs: No results for input(s): PROCALCITON, LATICACIDVEN in the last 168 hours.  No results found for this or any previous visit (from the past 240 hour(s)).   Radiology Studies: No results found.  Scheduled Meds: . carvedilol  3.125 mg Oral BID WC  . cloNIDine  0.1 mg Oral TID  . docusate sodium  100 mg Oral BID  . furosemide  60 mg Intravenous BID  . hydrALAZINE  50 mg Oral BID  . mometasone-formoterol  2 puff Inhalation BID  . montelukast  10 mg Oral QHS  . polyethylene glycol  17 g Oral Daily  . potassium chloride SA  30 mEq Oral Daily  . rivaroxaban  20 mg Oral Q supper  . sodium chloride flush  3 mL Intravenous Q12H   Continuous Infusions: . sodium chloride       LOS: 0 days   Judiann Celia, Orpah Melter, MD [facility] Pager [phone]  If 7PM-7AM, please contact night-coverage [email or web address] Password [email or web address] 12/22/2016, 1:33 PM

## 2016-12-23 LAB — BASIC METABOLIC PANEL
ANION GAP: 6 (ref 5–15)
BUN: 29 mg/dL — ABNORMAL HIGH (ref 6–20)
CO2: 33 mmol/L — ABNORMAL HIGH (ref 22–32)
Calcium: 8.6 mg/dL — ABNORMAL LOW (ref 8.9–10.3)
Chloride: 103 mmol/L (ref 101–111)
Creatinine, Ser: 2.07 mg/dL — ABNORMAL HIGH (ref 0.61–1.24)
GFR calc non Af Amer: 35 mL/min — ABNORMAL LOW (ref >60)
GFR, EST AFRICAN AMERICAN: 40 mL/min — AB (ref >60)
GLUCOSE: 94 mg/dL (ref 65–99)
POTASSIUM: 3.4 mmol/L — AB (ref 3.5–5.1)
Sodium: 142 mmol/L (ref 135–145)

## 2016-12-23 LAB — MAGNESIUM: Magnesium: 2.1 mg/dL (ref 1.7–2.4)

## 2016-12-23 MED ORDER — BENZONATATE 100 MG PO CAPS
100.0000 mg | ORAL_CAPSULE | Freq: Three times a day (TID) | ORAL | Status: DC | PRN
  Administered 2016-12-23: 100 mg via ORAL
  Filled 2016-12-23: qty 1

## 2016-12-23 MED ORDER — POTASSIUM CHLORIDE CRYS ER 20 MEQ PO TBCR
40.0000 meq | EXTENDED_RELEASE_TABLET | Freq: Once | ORAL | Status: AC
  Administered 2016-12-23: 40 meq via ORAL
  Filled 2016-12-23: qty 2

## 2016-12-23 NOTE — Progress Notes (Signed)
Patient will self administer CPAP when ready for bed

## 2016-12-23 NOTE — Progress Notes (Signed)
PROGRESS NOTE    Phillip [name]  MAU:[id number] DOB: April 22, 1960 DOA: 12/20/2016 PCP: [name], MD    Brief Narrative:  56 y.o. male with medical history significant for but not limited to CHF, sleep apnea, reactive airway disease presenting with 2 week history of progressively worsening shortness of breath on exertion with any fever or chills, no sputum production. No chest pain. He had been treated recently by his pulmonologist for community acquired pneumonia  Assessment & Plan:   Active Problems:   Asthma with acute exacerbation   Acute on chronic diastolic CHF (congestive heart failure) (H[facility])   Acute CHF (congestive heart failure) ([facility])   Acute diastolic (congestive) heart failure ([facility])  #1 acute on chronic diastolic CHF: Chest x-ray with edema without airspace disease Elevated BNP of more than 700 Exacerbating factor being possible recent community acquired pneumonia Patient is continued on BID IV lasix with good urine output thus far 2-D echocardiogram done and reviewed with normal LVEF, grade 2 diastolic dysfunction Overnight with 2.5L urine output noted thus far Repeat bmet in AM   #2 bronchial asthma: Bronchodilator nebulizations when necessary No audible wheezing at this time  #3 normocytic anemia with thrombocytopenia: Mild  Outpatient follow-up recommended Remains stable at present  DVT prophylaxis: SCD's Code Status: Full Family Communication: Pt in room, family not at bedside Disposition Plan: Uncertain at this time  Consultants:     Procedures:     Antimicrobials: Anti-infectives    None      Subjective: Reports feeling better at this time  Objective: Vitals:   12/22/16 2146 12/23/16 0500 12/23/16 0508 12/23/16 0803  BP:   (!) 141/74   Pulse:   (!) 55   Resp:   19   Temp:   97.7 F (36.5 C)   TempSrc:   Oral   SpO2: 95%  92% 94%  Weight:  (!) 166.2 kg (366 lb 6.5 oz)    Height:        Intake/Output Summary (Last 24 hours) at  12/23/16 1410 Last data filed at 12/23/16 0100  Gross per 24 hour  Intake              700 ml  Output             1400 ml  Net             -700 ml   Filed Weights   12/21/16 0613 12/22/16 0604 12/23/16 0500  Weight: (!) 167.5 kg (369 lb 4.8 oz) (!) 166.6 kg (367 lb 4.8 oz) (!) 166.2 kg (366 lb 6.5 oz)    Examination: General exam: Awake, laying in bed, in nad Respiratory system: Normal respiratory effort, no wheezing Cardiovascular system: regular rate, s1, s2 Gastrointestinal system: Soft, nondistended, positive BS Central nervous system: CN2-12 grossly intact, strength intact Extremities: Perfused, no clubbing Skin: Normal skin turgor, no notable skin lesions seen Psychiatry: Mood normal // no visual hallucinations   Data Reviewed: I have personally reviewed following labs and imaging studies  CBC:  Recent Labs Lab 12/20/16 0301  WBC 4.3  HGB 10.9*  HCT 35.0*  MCV 87.3  PLT 563*   Basic Metabolic Panel:  Recent Labs Lab 12/20/16 0301 12/21/16 0634 12/22/16 0534 12/23/16 0515  NA 143 145 144 142  K 3.6 3.4* 3.6 3.4*  CL 103 106 104 103  CO2 32 30 34* 33*  GLUCOSE 84 95 92 94  BUN 27* 25* 25* 29*  CREATININE 2.16* 1.89* 2.00* 2.07*  CALCIUM  9.0 9.1 8.8* 8.6*  MG  --   --   --  2.1   GFR: Estimated Creatinine Clearance: 63.2 mL/min (A) (by C-G formula based on SCr of 2.07 mg/dL (H)). Liver Function Tests: No results for input(s): AST, ALT, ALKPHOS, BILITOT, PROT, ALBUMIN in the last 168 hours. No results for input(s): LIPASE, AMYLASE in the last 168 hours. No results for input(s): AMMONIA in the last 168 hours. Coagulation Profile: No results for input(s): INR, PROTIME in the last 168 hours. Cardiac Enzymes:  Recent Labs Lab 12/20/16 1034 12/20/16 1719 12/20/16 2207  TROPONINI 0.04* 0.04* 0.05*   BNP (last 3 results)  Recent Labs  09/30/16 1452  PROBNP 877.0*   HbA1C: No results for input(s): HGBA1C in the last 72 hours. CBG: No results  for input(s): GLUCAP in the last 168 hours. Lipid Profile: No results for input(s): CHOL, HDL, LDLCALC, TRIG, CHOLHDL, LDLDIRECT in the last 72 hours. Thyroid Function Tests:  Recent Labs  12/21/16 0634  TSH 1.499   Anemia Panel: No results for input(s): VITAMINB12, FOLATE, FERRITIN, TIBC, IRON, RETICCTPCT in the last 72 hours. Sepsis Labs: No results for input(s): PROCALCITON, LATICACIDVEN in the last 168 hours.  No results found for this or any previous visit (from the past 240 hour(s)).   Radiology Studies: No results found.  Scheduled Meds: . carvedilol  3.125 mg Oral BID WC  . cloNIDine  0.1 mg Oral TID  . docusate sodium  100 mg Oral BID  . furosemide  60 mg Intravenous BID  . hydrALAZINE  50 mg Oral BID  . mometasone-formoterol  2 puff Inhalation BID  . montelukast  10 mg Oral QHS  . polyethylene glycol  17 g Oral Daily  . potassium chloride SA  30 mEq Oral Daily  . rivaroxaban  20 mg Oral Q supper  . sodium chloride flush  3 mL Intravenous Q12H   Continuous Infusions: . sodium chloride       LOS: 1 day   CHIU, Orpah Melter, MD [facility] Pager [phone]  If 7PM-7AM, please contact night-coverage [email or web address] Password [email or web address] 12/23/2016, 2:10 PM

## 2016-12-24 DIAGNOSIS — N183 Chronic kidney disease, stage 3 unspecified: Secondary | ICD-10-CM

## 2016-12-24 DIAGNOSIS — J449 Chronic obstructive pulmonary disease, unspecified: Secondary | ICD-10-CM

## 2016-12-24 DIAGNOSIS — N179 Acute kidney failure, unspecified: Secondary | ICD-10-CM

## 2016-12-24 DIAGNOSIS — Z9989 Dependence on other enabling machines and devices: Secondary | ICD-10-CM

## 2016-12-24 DIAGNOSIS — I5033 Acute on chronic diastolic (congestive) heart failure: Secondary | ICD-10-CM

## 2016-12-24 DIAGNOSIS — G4733 Obstructive sleep apnea (adult) (pediatric): Secondary | ICD-10-CM

## 2016-12-24 DIAGNOSIS — E876 Hypokalemia: Secondary | ICD-10-CM

## 2016-12-24 LAB — MAGNESIUM: Magnesium: 2.3 mg/dL (ref 1.7–2.4)

## 2016-12-24 LAB — BASIC METABOLIC PANEL
Anion gap: 6 (ref 5–15)
BUN: 35 mg/dL — ABNORMAL HIGH (ref 6–20)
CALCIUM: 8.7 mg/dL — AB (ref 8.9–10.3)
CHLORIDE: 105 mmol/L (ref 101–111)
CO2: 32 mmol/L (ref 22–32)
CREATININE: 2.26 mg/dL — AB (ref 0.61–1.24)
GFR calc Af Amer: 36 mL/min — ABNORMAL LOW (ref >60)
GFR calc non Af Amer: 31 mL/min — ABNORMAL LOW (ref >60)
GLUCOSE: 96 mg/dL (ref 65–99)
Potassium: 3.8 mmol/L (ref 3.5–5.1)
Sodium: 143 mmol/L (ref 135–145)

## 2016-12-24 MED ORDER — TORSEMIDE 20 MG PO TABS
60.0000 mg | ORAL_TABLET | Freq: Every day | ORAL | Status: DC
  Administered 2016-12-24: 60 mg via ORAL
  Filled 2016-12-24: qty 3

## 2016-12-24 MED ORDER — CARVEDILOL 3.125 MG PO TABS: 3.1250 mg | ORAL_TABLET | Freq: Two times a day (BID) | ORAL | 1 refills | Status: DC

## 2016-12-24 MED ORDER — TORSEMIDE 20 MG PO TABS: ORAL_TABLET | ORAL | 3 refills | Status: DC

## 2016-12-24 NOTE — Discharge Summary (Signed)
Physician Discharge Summary  Judas Mohammad UVO:[id number] DOB: 1961-02-14 DOA: 12/20/2016  PCP: [name], MD  Admit date: 12/20/2016 Discharge date: 12/24/2016  Time spent: 35 minutes  Recommendations for Outpatient Follow-up:  Repeat BMET to follow electrolytes and renal function  Reassess BP and adjust antihypertensive regimen as needed  Repeat CBC to follow platelet and Hgb count  Discharge Diagnoses:  Active Problems:   Asthma with acute exacerbation   Acute on chronic diastolic CHF (congestive heart failure) (HCC)   Acute CHF (congestive heart failure) (HCC)   Acute diastolic (congestive) heart failure (HCC)   Acute renal failure superimposed on stage 3 chronic kidney disease ([place])   Asthma with COPD ([place])   Discharge Condition: stable and improved. No CP, no palpitations and improved breathing. Patient discharge home and instructed to follow up with PCP in 10 days.  Diet recommendation: low calorie and heart healthy diet   Filed Weights   12/22/16 0604 12/23/16 0500 12/24/16 0321  Weight: (!) 166.6 kg (367 lb 4.8 oz) (!) 166.2 kg (366 lb 6.5 oz) (!) 167.5 kg (369 lb 4.3 oz)    History of present illness:  As per H&P written by Dr. Vista Lawman on 12/20/16 56 y.o. male with medical history significant for but not limited to CHF, sleep apnea, reactive airway disease presenting with 2 week history of progressively worsening shortness of breath on exertion with any fever or chills, no sputum production. No chest pain. He had been treated recently by his pulmonologist for community acquired pneumonia  ED Course: at the ED patient was noted to have some wheezing with significant desaturation on exertion. X-ray showed vascular congestion with elevated B natruretic peptide of 748. Patient admitted for acute CHF  Hospital Course:  #1 acute on chronic diastolic CHF: -Chest x-ray with edema without airspace disease -Elevated BNP of 1,091 (in a patient with obesity) -Exacerbating  factor being possible recent community acquired pneumonia and diet indiscretion -good response to IV diuretics -demadex dose adjusted at discharge -2-D echocardiogram done and reviewed with normal LVEF, grade 2 diastolic dysfunction -repeat BMET at follow up visit.   #2 bronchial asthma: -stable and well controlled -continue home Bronchodilator regimen  -no wheezing, and good O2 sat on RA.  #3 normocytic anemia with thrombocytopenia: -Mild  -Outpatient follow-up recommended -Remains stable at present  #4 acute on chronic renal failure: stage 2-3 at baseline -due to CHF exacerbation and subsequent due to aggressive diuresis -diuretic dose adjusted and patient advise to follow low sodium diet and maintain adequate hydration -BMET recommended at follow up  #5 morbid obesity -Body mass index is 53.75 kg/m. -low calorie diet and exercise discussed with patient  #6 hx of gout -stable and no flare appreciated -will continue allopurinol  #7 HTN -BP stable -will continue hydralazine, clonidine and demadex -low dose coreg also added and tolerated -follow BP stability at follow up.  #8 OSA -continue QHS CPAP  #9 PAF -CHADSVASC score 3 -continue xarelto -SR during this admission   Procedures: - Left ventricle: The cavity size was normal. Wall thickness was   increased in a pattern of moderate LVH. Systolic function was   normal. The estimated ejection fraction was in the range of 55%   to 60%. Features are consistent with a pseudonormal left   ventricular filling pattern, with concomitant abnormal relaxation   and increased filling pressure (grade 2 diastolic dysfunction).   Doppler parameters are consistent with high ventricular filling   pressure. - Aortic valve: There was mild  regurgitation. Valve area (VTI):   5.17 cm^2. Valve area (Vmax): 4.93 cm^2. Valve area (Vmean): 4.52   cm^2. - Left atrium: The atrium was severely dilated. - Technically difficult study.  Echocontrast was used to enhance   visualization.  Consultations:  None   Discharge Exam: Vitals:   12/24/16 0321 12/24/16 0735  BP: (!) 113/51   Pulse: (!) 57   Resp: 18   Temp: (!) 97.5 F (36.4 C)   SpO2: 100% 99%   General exam: Awake, laying in bed, in nad Respiratory system: Normal respiratory effort, no wheezing Cardiovascular system: regular rate, s1, s2 Gastrointestinal system: Soft, nondistended, positive BS Central nervous system: CN2-12 grossly intact, strength intact Extremities: Perfused, no clubbing Skin: Normal skin turgor, no notable skin lesions seen Psychiatry: Mood normal // no visual hallucinations    Discharge Instructions   Discharge Instructions    (HEART FAILURE PATIENTS) Call MD:  Anytime you have any of the following symptoms: 1) 3 pound weight gain in 24 hours or 5 pounds in 1 week 2) shortness of breath, with or without a dry hacking cough 3) swelling in the hands, feet or stomach 4) if you have to sleep on extra pillows at night in order to breathe.    Complete by:  As directed    Diet - low sodium heart healthy    Complete by:  As directed    Discharge instructions    Complete by:  As directed    Take medications as prescribed Follow low sodium diet (less than 2 gram daily) Maintain adequate hydration  Arrange follow up with PCP in 10 days     Current Discharge Medication List    START taking these medications   Details  carvedilol (COREG) 3.125 MG tablet Take 1 tablet (3.125 mg total) by mouth 2 (two) times daily with a meal. Qty: 60 tablet, Refills: 1      CONTINUE these medications which have CHANGED   Details  torsemide (DEMADEX) 20 MG tablet Take 3 tablets (60mg ) by mouth in the morning.  Take 1 tablet (20mg ) by mouth in the evening. Qty: 270 tablet, Refills: 3      CONTINUE these medications which have NOT CHANGED   Details  albuterol (PROVENTIL HFA;VENTOLIN HFA) 108 (90 Base) MCG/ACT inhaler Inhale 2 puffs into the lungs  every 4 (four) hours as needed for wheezing or shortness of breath. Qty: 1 Inhaler, Refills: 2    albuterol (PROVENTIL) (2.5 MG/3ML) 0.083% nebulizer solution Take 3 mLs (2.5 mg total) by nebulization every 6 (six) hours as needed for wheezing or shortness of breath. Qty: 360 mL, Refills: 11    allopurinol (ZYLOPRIM) 300 MG tablet Take 1 tablet (300 mg total) by mouth daily. Qty: 30 tablet, Refills: 6    budesonide-formoterol (SYMBICORT) 80-4.5 MCG/ACT inhaler Inhale 2 puffs into the lungs 2 (two) times daily. Qty: 1 Inhaler, Refills: 0    cloNIDine (CATAPRES) 0.1 MG tablet Take 1 tablet (0.1 mg total) by mouth 3 (three) times daily. Qty: 90 tablet, Refills: 0    colchicine 0.6 MG tablet Take 1 tablet (0.6 mg total) by mouth daily. Qty: 30 tablet, Refills: 0    hydrALAZINE (APRESOLINE) 50 MG tablet Take 50 mg by mouth 2 (two) times daily.    montelukast (SINGULAIR) 10 MG tablet Take 1 tablet (10 mg total) by mouth at bedtime. Qty: 30 tablet, Refills: 11    NONFORMULARY OR COMPOUNDED ITEM Apply 1 drop topically daily. Nail Lacquer: Fluconazole 2% Terbinafine 1%  DMSO Qty: 30 each, Refills: 2    OXYGEN Inhale 2 L into the lungs daily. 2lpm when needed per pt     potassium chloride SA (K-DUR,KLOR-CON) 20 MEQ tablet Take 1.5 tablets (30 mEq total) by mouth daily. Qty: 90 tablet, Refills: 3   Associated Diagnoses: Hypokalemia    UNABLE TO FIND Med Name: CPAP with sleep    XARELTO 20 MG TABS tablet TAKE 1 TABLET BY MOUTH DAILY WITH SUPPER Qty: 30 tablet, Refills: 5      STOP taking these medications     naproxen sodium (ANAPROX) 220 MG tablet        No Known Allergies Follow-up Information    [name], MD. Schedule an appointment as soon as possible for a visit in 10 day(s).   Specialty:  Internal Medicine Contact information: [address] [place] [address] [phone]           The results of significant diagnostics from this hospitalization  (including imaging, microbiology, ancillary and laboratory) are listed below for reference.    Significant Diagnostic Studies: Dg Chest 2 View  Result Date: 12/20/2016 CLINICAL DATA:  Dyspnea and chest pressure x2 days. 5 pound weight gain in 1 day. EXAM: CHEST  2 VIEW COMPARISON:  09/30/2016 FINDINGS: Stable cardiomegaly. Uncoiling of the thoracic aorta without aneurysm. Central vascular congestion consistent mild CHF. Subpleural atelectasis and/or scarring in the left upper lobe. No acute osseous abnormality. No pleural effusion. IMPRESSION: Cardiomegaly with mild central vascular congestion consistent with CHF. Electronically Signed   By: Ashley Royalty M.D.   On: 12/20/2016 03:28   Labs: Basic Metabolic Panel:  Recent Labs Lab 12/20/16 0301 12/21/16 0634 12/22/16 0534 12/23/16 0515 12/24/16 0518  NA 143 145 144 142 143  K 3.6 3.4* 3.6 3.4* 3.8  CL 103 106 104 103 105  CO2 32 30 34* 33* 32  GLUCOSE 84 95 92 94 96  BUN 27* 25* 25* 29* 35*  CREATININE 2.16* 1.89* 2.00* 2.07* 2.26*  CALCIUM 9.0 9.1 8.8* 8.6* 8.7*  MG  --   --   --  2.1 2.3   CBC:  Recent Labs Lab 12/20/16 0301  WBC 4.3  HGB 10.9*  HCT 35.0*  MCV 87.3  PLT 142*   Cardiac Enzymes:  Recent Labs Lab 12/20/16 1034 12/20/16 1719 12/20/16 2207  TROPONINI 0.04* 0.04* 0.05*   BNP: BNP (last 3 results)  Recent Labs  09/15/16 0509 12/20/16 0301 12/21/16 0634  BNP 868.5* 748.0* 1,091.6*    ProBNP (last 3 results)  Recent Labs  09/30/16 1452  PROBNP 877.0*    Signed:  Barton Dubois MD.  [facility] 12/24/2016, 5:15 PM

## 2016-12-24 NOTE — Care Management Note (Signed)
Case Management Note  Patient Details  Name: Phillip [name] MRN: [id number] Date of Birth: 1961/02/09  Subjective/Objective:  56 y/o m admitted w/CHF. From home. No CM needs.                  Action/Plan:d/c home.   Expected Discharge Date:                  Expected Discharge Plan:  Home/Self Care  In-House Referral:     Discharge planning Services  CM Consult  Post Acute Care Choice:    Choice offered to:     DME Arranged:    DME Agency:     HH Arranged:    [place] Agency:     Status of Service:  Completed, signed off  If discussed at [employer] of Stay Meetings, dates discussed:    Additional Comments:  Dessa Phi, RN 12/24/2016, 4:33 PM

## 2016-12-30 NOTE — ED Provider Notes (Signed)
[facility] DEPT Provider Note   CSN: [id number] Arrival date & time: 12/20/16  0230     History   Chief Complaint Chief Complaint  Patient presents with  . Shortness of Breath    HPI Phillip [name] is a 56 y.o. male.  HPI Patient increasingly short of breath for 2 weeks duration. Worse with exertion. Patient's also noticing a tightness in his chest. No fever. Symptoms have felt similar to his prior congestive heart failure. He also has history of asthma. He is using his inhalers but not getting any relief. Past Medical History:  Diagnosis Date  . CHF (congestive heart failure) ([place]) 11/15   EF 40-45%, improved with follow-up  . Gout   . Hypertension   . Hypertensive cardiovascular disease   . Morbid obesity ([place])   . Obstructive sleep apnea    complaint with CPAP  . OSA on CPAP   . Paroxysmal atrial fibrillation (HCC)   . PNA (pneumonia)   . Pulmonary embolism [facility])     Patient Active Problem List   Diagnosis Date Noted  . Acute renal failure superimposed on stage 3 chronic kidney disease ([place])   . Asthma with COPD ([place])   . Acute diastolic (congestive) heart failure ([place]) 12/22/2016  . Acute CHF (congestive heart failure) ([place]) 12/20/2016  . Acute on chronic diastolic CHF (congestive heart failure) ([place])   . Long term (current) use of anticoagulants [Z79.01] 08/17/2015  . Hx pulmonary embolism 08/17/2015  . Deep vein thrombosis (DVT) of lower extremity ([place]) 08/17/2015  . Paroxysmal atrial fibrillation ([place]) 08/04/2015  . HCAP (healthcare-associated pneumonia) 07/04/2015  . SVT (supraventricular tachycardia) ([place]) 06/22/2015  . Acute on chronic respiratory failure with hypoxia ([place]) 06/21/2015  . Dyspnea on exertion 06/20/2015  . Obesity, Class III, BMI 40-49.9 (morbid obesity) ([place]) 03/07/2015  . Acute respiratory failure ([place]) 02/20/2015  . Elevated troponin 02/20/2015  . Essential hypertension   . Gout   . CAP (community acquired pneumonia) 05/07/2014  .  Mild persistent asthma in adult without complication 37/62/8315  . OSA on CPAP 05/06/2014  . Hypoxia 05/06/2014  . Hypokalemia 05/06/2014  . Chronic kidney disease, stage 3 ([place]) 05/06/2014  . Asthma with acute exacerbation 05/06/2014    Past Surgical History:  Procedure Laterality Date  . UMBILICAL HERNIA REPAIR  1992       Home Medications    Prior to Admission medications   Medication Sig Start Date End Date Taking? Authorizing Provider  albuterol (PROVENTIL HFA;VENTOLIN HFA) 108 (90 Base) MCG/ACT inhaler Inhale 2 puffs into the lungs every 4 (four) hours as needed for wheezing or shortness of breath. 12/01/16  Yes Tanda Rockers, MD  albuterol (PROVENTIL) (2.5 MG/3ML) 0.083% nebulizer solution Take 3 mLs (2.5 mg total) by nebulization every 6 (six) hours as needed for wheezing or shortness of breath. 09/12/16  Yes Tanda Rockers, MD  allopurinol (ZYLOPRIM) 300 MG tablet Take 1 tablet (300 mg total) by mouth daily. 07/07/16  Yes Edrick Kins, DPM  budesonide-formoterol (SYMBICORT) 80-4.5 MCG/ACT inhaler Inhale 2 puffs into the lungs 2 (two) times daily. 07/21/16  Yes Tanda Rockers, MD  cloNIDine (CATAPRES) 0.1 MG tablet Take 1 tablet (0.1 mg total) by mouth 3 (three) times daily. 12/16/15  Yes Short, Noah Delaine, MD  colchicine 0.6 MG tablet Take 1 tablet (0.6 mg total) by mouth daily. Patient taking differently: Take 0.6 mg by mouth daily as needed (gout).  07/07/16  Yes Edrick Kins, DPM  hydrALAZINE (APRESOLINE) 50 MG  tablet Take 50 mg by mouth 2 (two) times daily.   Yes [provider]  montelukast (SINGULAIR) 10 MG tablet Take 1 tablet (10 mg total) by mouth at bedtime. 12/16/16  Yes Tanda Rockers, MD  NONFORMULARY OR COMPOUNDED ITEM Apply 1 drop topically daily. Nail Lacquer: Fluconazole 2% Terbinafine 1% DMSO 05/14/16  Yes Edrick Kins, DPM  OXYGEN Inhale 2 L into the lungs daily. 2lpm when needed per pt    Yes [provider]  potassium chloride SA  (K-DUR,KLOR-CON) 20 MEQ tablet Take 1.5 tablets (30 mEq total) by mouth daily. Patient taking differently: Take 20 mEq by mouth daily.  10/17/16  Yes Belva Crome, MD  UNABLE TO FIND Med Name: CPAP with sleep   Yes [provider]  XARELTO 20 MG TABS tablet TAKE 1 TABLET BY MOUTH DAILY WITH SUPPER 09/10/16  Yes Belva Crome, MD  carvedilol (COREG) 3.125 MG tablet Take 1 tablet (3.125 mg total) by mouth 2 (two) times daily with a meal. 12/24/16   Barton Dubois, MD  torsemide (DEMADEX) 20 MG tablet Take 3 tablets (60mg ) by mouth in the morning.  Take 1 tablet (20mg ) by mouth in the evening. 12/24/16   Barton Dubois, MD    Family History Family History  Problem Relation Age of Onset  . Diabetes Father   . CAD Father   . Hypertension Other   . Diabetes Other   . Allergies Brother     Social History Social History  Substance Use Topics  . Smoking status: Former Smoker    Packs/day: 0.25    Years: 1.00    Types: Cigarettes    Quit date: 07/23/1989  . Smokeless tobacco: Never Used  . Alcohol use No     Allergies   Patient has no known allergies.   Review of Systems Review of Systems 10 Systems reviewed and are negative for acute change except as noted in the HPI.   Physical Exam Updated Vital Signs BP (!) 113/51 (BP Location: Left Arm)   Pulse (!) 57   Temp (!) 97.5 F (36.4 C) (Oral)   Resp 18   Ht 5' 9.5" (1.765 m)   Wt (!) 167.5 kg (369 lb 4.3 oz)   SpO2 99%   BMI 53.75 kg/m   Physical Exam  Constitutional: He is oriented to person, place, and time.  Patient alert and nontoxic. Mild to moderate increased work of breathing at rest. Morbid obesity.  HENT:  Head: Normocephalic and atraumatic.  Mouth/Throat: Oropharynx is clear and moist.  Eyes: EOM are normal.  Neck: Neck supple.  Cardiovascular: Normal rate and regular rhythm.   Pulmonary/Chest:  Mild increased work of breathing at rest. Soft diminished breath sounds at bases.  Abdominal: Soft. There  is no tenderness.  Musculoskeletal: Normal range of motion. He exhibits edema. He exhibits no tenderness.  Neurological: He is alert and oriented to person, place, and time. No cranial nerve deficit. He exhibits normal muscle tone. Coordination normal.  Skin: Skin is warm and dry.  Psychiatric: He has a normal mood and affect.     ED Treatments / Results  Labs (all labs ordered are listed, but only abnormal results are displayed) Labs Reviewed  BASIC METABOLIC PANEL - Abnormal; Notable for the following:       Result Value   BUN 27 (*)    Creatinine, Ser 2.16 (*)    GFR calc non Af Amer 33 (*)    GFR calc Af Wyvonnia Lora  38 (*)    All other components within normal limits  CBC - Abnormal; Notable for the following:    RBC 4.01 (*)    Hemoglobin 10.9 (*)    HCT 35.0 (*)    Platelets 142 (*)    All other components within normal limits  BRAIN NATRIURETIC PEPTIDE - Abnormal; Notable for the following:    B Natriuretic Peptide 748.0 (*)    All other components within normal limits  TROPONIN I - Abnormal; Notable for the following:    Troponin I 0.04 (*)    All other components within normal limits  TROPONIN I - Abnormal; Notable for the following:    Troponin I 0.04 (*)    All other components within normal limits  TROPONIN I - Abnormal; Notable for the following:    Troponin I 0.05 (*)    All other components within normal limits  BASIC METABOLIC PANEL - Abnormal; Notable for the following:    Potassium 3.4 (*)    BUN 25 (*)    Creatinine, Ser 1.89 (*)    GFR calc non Af Amer 39 (*)    GFR calc Af Amer 45 (*)    All other components within normal limits  BRAIN NATRIURETIC PEPTIDE - Abnormal; Notable for the following:    B Natriuretic Peptide 1,091.6 (*)    All other components within normal limits  BASIC METABOLIC PANEL - Abnormal; Notable for the following:    CO2 34 (*)    BUN 25 (*)    Creatinine, Ser 2.00 (*)    Calcium 8.8 (*)    GFR calc non Af Amer 36 (*)    GFR calc  Af Amer 42 (*)    All other components within normal limits  BASIC METABOLIC PANEL - Abnormal; Notable for the following:    Potassium 3.4 (*)    CO2 33 (*)    BUN 29 (*)    Creatinine, Ser 2.07 (*)    Calcium 8.6 (*)    GFR calc non Af Amer 35 (*)    GFR calc Af Amer 40 (*)    All other components within normal limits  BASIC METABOLIC PANEL - Abnormal; Notable for the following:    BUN 35 (*)    Creatinine, Ser 2.26 (*)    Calcium 8.7 (*)    GFR calc non Af Amer 31 (*)    GFR calc Af Amer 36 (*)    All other components within normal limits  HIV ANTIBODY (ROUTINE TESTING)  TSH  MAGNESIUM  MAGNESIUM  I-STAT TROPONIN, ED  POCT I-STAT TROPONIN I    EKG  EKG Interpretation  Date/Time:  Saturday December 20 2016 02:52:50 EDT Ventricular Rate:  78 PR Interval:    QRS Duration: 90 QT Interval:  442 QTC Calculation: 504 R Axis:   -26 Text Interpretation:  Sinus arrhythmia Borderline left axis deviation Nonspecific T abnormalities, lateral leads Prolonged QT interval no change from previous. Confirmed by Charlesetta Shanks [phone]) on 12/20/2016 8:07:33 AM       Radiology No results found.  Procedures Procedures (including critical care time)  Medications Ordered in ED Medications  furosemide (LASIX) injection 80 mg (80 mg Intravenous Given 12/21/16 1700)  perflutren lipid microspheres (DEFINITY) IV suspension (9 mLs Intravenous Given 12/21/16 0942)  albuterol (PROVENTIL) (2.5 MG/3ML) 0.083% nebulizer solution 5 mg (5 mg Nebulization Given 12/20/16 0334)  furosemide (LASIX) injection 60 mg (60 mg Intravenous Given 12/20/16 0905)  aspirin chewable tablet 324 mg (  324 mg Oral Given 12/20/16 0901)  nitroGLYCERIN (NITROGLYN) 2 % ointment 1 inch (1 inch Topical Given 12/20/16 0901)  potassium chloride SA (K-DUR,KLOR-CON) CR tablet 40 mEq (40 mEq Oral Given 12/23/16 1034)     Initial Impression / Assessment and Plan / ED Course  I have reviewed the triage vital signs and the nursing  notes.  Pertinent labs & imaging results that were available during my care of the patient were reviewed by me and considered in my medical decision making (see chart for details).     Consult: Hospitalist for admission  Final Clinical Impressions(s) / ED Diagnoses   Final diagnoses:  Acute on chronic congestive heart failure, unspecified heart failure type ([place])  Precordial pain   Patient resents with increasing dyspnea and chest pressure. Findings most suggestive of congestive heart failure. His acute on chronic. Plan for admission. New Prescriptions Discharge Medication List as of 12/24/2016  5:37 PM    START taking these medications   Details  carvedilol (COREG) 3.125 MG tablet Take 1 tablet (3.125 mg total) by mouth 2 (two) times daily with a meal., Starting Wed 12/24/2016, Print         Charlesetta Shanks, MD 12/30/16 [phone]

## 2017-01-07 ENCOUNTER — Encounter: Payer: Self-pay | Admitting: Interventional Cardiology

## 2017-01-09 ENCOUNTER — Telehealth: Payer: Self-pay | Admitting: *Deleted

## 2017-01-09 NOTE — Telephone Encounter (Signed)
Refill request for allopurinol. Dr.Evans states pt needs an appt, prior to future refill. Return fax denying.

## 2017-01-20 ENCOUNTER — Ambulatory Visit: Payer: Self-pay | Admitting: Internal Medicine

## 2017-01-20 ENCOUNTER — Ambulatory Visit (INDEPENDENT_AMBULATORY_CARE_PROVIDER_SITE_OTHER): Payer: Managed Care, Other (non HMO) | Admitting: Interventional Cardiology

## 2017-01-20 ENCOUNTER — Encounter: Payer: Self-pay | Admitting: Interventional Cardiology

## 2017-01-20 VITALS — BP 130/76 | HR 68 | Ht 69.5 in | Wt 361.8 lb

## 2017-01-20 DIAGNOSIS — I5033 Acute on chronic diastolic (congestive) heart failure: Secondary | ICD-10-CM | POA: Diagnosis not present

## 2017-01-20 DIAGNOSIS — Z9989 Dependence on other enabling machines and devices: Secondary | ICD-10-CM

## 2017-01-20 DIAGNOSIS — I480 Paroxysmal atrial fibrillation: Secondary | ICD-10-CM | POA: Diagnosis not present

## 2017-01-20 DIAGNOSIS — I10 Essential (primary) hypertension: Secondary | ICD-10-CM

## 2017-01-20 DIAGNOSIS — G4733 Obstructive sleep apnea (adult) (pediatric): Secondary | ICD-10-CM

## 2017-01-20 DIAGNOSIS — I471 Supraventricular tachycardia: Secondary | ICD-10-CM

## 2017-01-20 MED ORDER — TORSEMIDE 20 MG PO TABS: 60.0000 mg | ORAL_TABLET | Freq: Two times a day (BID) | ORAL | 3 refills | Status: DC

## 2017-01-20 NOTE — Patient Instructions (Signed)
Medication Instructions:  1) INCREASE Torsemide to 60mg  (3 tablets) twice daily  Labwork: Your physician recommends that you return for lab work in: 1 week (BMET)   Testing/Procedures: None  Follow-Up:  You have been referred to our [facility].  (Please schedule next available with Dr. Haroldine Laws or Aundra Dubin)  Your physician wants you to follow-up in: 6-8 months with Dr. [name]. You will receive a reminder letter in the mail two months in advance. If you don't receive a letter, please call our office to schedule the follow-up appointment.   Any Other Special Instructions Will Be Listed Below (If Applicable).     If you need a refill on your cardiac medications before your next appointment, please call your pharmacy.

## 2017-01-20 NOTE — Progress Notes (Signed)
Cardiology Office Note    Date:  01/20/2017   ID:  Phillip [name], Phillip [name] 1960-04-11, MRN [id number]  PCP:  [name], MD  Cardiologist: Sinclair Grooms, MD   Chief Complaint  Patient presents with  . Congestive Heart Failure    History of Present Illness:  Phillip [name] is a 56 y.o. male hx of PAF, remote PE, hx of DVT, OSA, morbid obesity and HTN also with combined chronic systolic and diastolic HF. With EF 40-45%.  He has been wearing CPAP.  With CHA2DS2VASc score 2 and pt on xarelto.    Last echo 06/21/15 with EF 55-60% G1DD, moderate aortic regurgitation.  Last nuc 2008 no ischemia, no hx of cath.    He has had multiple admissions to the hospital for volume overload/acute on chronic diastolic heart failure.  These have never been associated with persistent arrhythmia.  Most recent episode occurred in September timeframe and was resolved by IV diuresis.  Upon speaking to the patient, he admits volume excess because he felt thirsty all the time.  His diet is not significantly sodium restricted.  I am not certain that he is absolutely compliant with his medications although he says that he is.  He does adjust the medications based upon this shift that he works.  No associated chest pain.  Denies tachycardia and palpitations.   Past Medical History:  Diagnosis Date  . CHF (congestive heart failure) ([facility]) 11/15   EF 40-45%, improved with follow-up  . Gout   . Hypertension   . Hypertensive cardiovascular disease   . Morbid obesity ([place])   . Obstructive sleep apnea    complaint with CPAP  . OSA on CPAP   . Paroxysmal atrial fibrillation (HCC)   . PNA (pneumonia)   . Pulmonary embolism [facility])     Past Surgical History:  Procedure Laterality Date  . UMBILICAL HERNIA REPAIR  1992    Current Medications: Outpatient Medications Prior to Visit  Medication Sig Dispense Refill  . albuterol (PROVENTIL HFA;VENTOLIN HFA) 108 (90 Base) MCG/ACT inhaler Inhale 2 puffs into the lungs  every 4 (four) hours as needed for wheezing or shortness of breath. 1 Inhaler 2  . albuterol (PROVENTIL) (2.5 MG/3ML) 0.083% nebulizer solution Take 3 mLs (2.5 mg total) by nebulization every 6 (six) hours as needed for wheezing or shortness of breath. 360 mL 11  . allopurinol (ZYLOPRIM) 300 MG tablet Take 1 tablet (300 mg total) by mouth daily. 30 tablet 6  . budesonide-formoterol (SYMBICORT) 80-4.5 MCG/ACT inhaler Inhale 2 puffs into the lungs 2 (two) times daily. 1 Inhaler 0  . carvedilol (COREG) 3.125 MG tablet Take 1 tablet (3.125 mg total) by mouth 2 (two) times daily with a meal. 60 tablet 1  . cloNIDine (CATAPRES) 0.1 MG tablet Take 1 tablet (0.1 mg total) by mouth 3 (three) times daily. 90 tablet 0  . colchicine 0.6 MG tablet Take 1 tablet (0.6 mg total) by mouth daily. (Patient taking differently: Take 0.6 mg by mouth daily as needed (gout). ) 30 tablet 0  . hydrALAZINE (APRESOLINE) 50 MG tablet Take 50 mg by mouth 2 (two) times daily.    . montelukast (SINGULAIR) 10 MG tablet Take 1 tablet (10 mg total) by mouth at bedtime. 30 tablet 11  . NONFORMULARY OR COMPOUNDED ITEM Apply 1 drop topically daily. Nail Lacquer: Fluconazole 2% Terbinafine 1% DMSO 30 each 2  . OXYGEN Inhale 2 L into the lungs daily. 2lpm when needed per pt     .  potassium chloride SA (K-DUR,KLOR-CON) 20 MEQ tablet Take 1.5 tablets (30 mEq total) by mouth daily. (Patient taking differently: Take 20 mEq by mouth daily. ) 90 tablet 3  . UNABLE TO FIND Med Name: CPAP with sleep    . XARELTO 20 MG TABS tablet TAKE 1 TABLET BY MOUTH DAILY WITH SUPPER 30 tablet 5  . torsemide (DEMADEX) 20 MG tablet Take 3 tablets (23m) by mouth in the morning.  Take 1 tablet (219m by mouth in the evening. 270 tablet 3   No facility-administered medications prior to visit.      Allergies:   Patient has no known allergies.   Social History   Social History  . Marital status: Divorced    Spouse name: N/A  . Number of children: N/A  .  Years of education: N/A   Occupational History  . disabled, drove a dump truck    Social History Main Topics  . Smoking status: Former Smoker    Packs/day: 0.25    Years: 1.00    Types: Cigarettes    Quit date: 07/23/1989  . Smokeless tobacco: Never Used  . Alcohol use No  . Drug use: No  . Sexual activity: Not Currently   Other Topics Concern  . None   Social History Narrative   Pt lives in [place].   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   Has 2 boys.     Family History:  The patient's family history includes Allergies in his brother; CAD in his father; Diabetes in his father and other; Hypertension in his other.   ROS:   Please see the history of present illness.    Leg swelling, dyspnea on exertion, orthopnea, hip joint discomfort, and difficulty sleeping. All other systems reviewed and are negative.   PHYSICAL EXAM:   VS:  BP 130/76 (BP Location: Left Arm)   Pulse 68   Ht 5' 9.5" (1.765 m)   Wt (!) 361 lb 12.8 oz (164.1 kg)   BMI 52.66 kg/m    GEN: Well nourished, well developed, in no acute distress.  Morbid obesity. HEENT: normal  Neck: no JVD, carotid bruits, or masses Cardiac: RRR; no murmurs, rubs, or gallops,no edema  Respiratory:  clear to auscultation bilaterally, normal work of breathing GI: soft, nontender, nondistended, + BS MS: no deformity or atrophy  Skin: warm and dry, no rash Neuro:  Alert and Oriented x 3, Strength and sensation are intact Psych: euthymic [name], full affect  Wt Readings from Last 3 Encounters:  01/20/17 (!) 361 lb 12.8 oz (164.1 kg)  12/24/16 (!) 369 lb 4.3 oz (167.5 kg)  12/16/16 (!) 372 lb (168.7 kg)      Studies/Labs Reviewed:   EKG:  EKG no new data.  EKG from the most recent hospital stay revealed normal sinus rhythm, biatrial abnormality, and left axis deviation.  Recent Labs: 06/02/2016: ALT 17 09/30/2016: Pro B Natriuretic peptide (BNP) 877.0 12/20/2016: Hemoglobin 10.9;  Platelets 142 12/21/2016: B Natriuretic Peptide 1,091.6; TSH 1.499 12/24/2016: BUN 35; Creatinine, Ser 2.26; Magnesium 2.3; Potassium 3.8; Sodium 143   Lipid Panel    Component Value Date/Time   CHOL 175 02/21/2015 0431   TRIG 37 02/21/2015 0431   HDL 66 02/21/2015 0431   CHOLHDL 2.7 02/21/2015 0431   VLDL 7 02/21/2015 0431   LDLCALC 102 (H) 02/21/2015 0431    Additional studies/ records that were reviewed today include:  2D Doppler echocardiogram September 2018: Study Conclusions   -  Left ventricle: The cavity size was normal. Wall thickness was   increased in a pattern of moderate LVH. Systolic function was   normal. The estimated ejection fraction was in the range of 55%   to 60%. Features are consistent with a pseudonormal left   ventricular filling pattern, with concomitant abnormal relaxation   and increased filling pressure (grade 2 diastolic dysfunction).   Doppler parameters are consistent with high ventricular filling   pressure. - Aortic valve: There was mild regurgitation. Valve area (VTI):   5.17 cm^2. Valve area (Vmax): 4.93 cm^2. Valve area (Vmean): 4.52   cm^2. - Left atrium: The atrium was severely dilated. - Technically difficult study. Echocontrast was used to enhance   visualization.      ASSESSMENT:    1. Acute on chronic diastolic CHF (congestive heart failure) ([place])   2. SVT (supraventricular tachycardia) (HCC)   3. Paroxysmal atrial fibrillation ([place])   4. Essential hypertension   5. OSA on CPAP   6. Morbid obesity ([place])      PLAN:  In order of problems listed above:  1. Difficult to assess current volume status.  He is clearly improved compared to September.  There is lower extremity edema.  I have instructed him to increase torsemide to 60 mg twice daily with a basic metabolic panel performed in 1 week hence.  I have recommended that he be evaluated by the advanced heart failure team.  He needs a resource rich exposure to improve compliance  and outcome. 2. No recurrent SVT. 3. Blood pressures well controlled today. 4. Continue compliance with CPAP. 5. Weight loss is again discussed.  I have had conversations concerning bariatric surgery but he is not willing to pursue this.  Referral to advanced heart failure team for help with management.  Clinical follow-up in 6 months.  Increase torsemide to 60 mg twice daily with be met in 1 week.  Encourage the patient to weigh daily under similar circumstances.  Notify us if 3 or 5 pound weight gain noted in 1 day or 1 week respectively.  2000 mL fluid restriction is discussed.  General concept of diastolic heart failure also discussed including natural history.    Medication Adjustments/Labs and Tests Ordered: Current medicines are reviewed at length with the patient today.  Concerns regarding medicines are outlined above.  Medication changes, Labs and Tests ordered today are listed in the Patient Instructions below. Patient Instructions  Medication Instructions:  1) INCREASE Torsemide to 15m (3 tablets) twice daily  Labwork: Your physician recommends that you return for lab work in: 1 week (BMET)   Testing/Procedures: None  Follow-Up:  [facility]  (Please schedule next available with Dr. BHaroldine Lawsor MAundra Dubin  Your physician wants you to follow-up in: 6-8 months with Dr. STamala Julian You will receive a reminder letter in the mail two months in advance. If you don't receive a letter, please call our office to schedule the follow-up appointment.   Any Other Special Instructions Will Be Listed Below (If Applicable).     If you need a refill on your cardiac medications before your next appointment, please call your pharmacy.      Signed, HSinclair Grooms MD  01/20/2017 5:31 PM    [address]  [id number]: [phone] Fax: [phone]

## 2017-01-26 ENCOUNTER — Other Ambulatory Visit: Payer: Managed Care, Other (non HMO) | Admitting: *Deleted

## 2017-01-26 DIAGNOSIS — I10 Essential (primary) hypertension: Secondary | ICD-10-CM

## 2017-01-26 DIAGNOSIS — I5033 Acute on chronic diastolic (congestive) heart failure: Secondary | ICD-10-CM

## 2017-01-27 ENCOUNTER — Telehealth: Payer: Self-pay | Admitting: Internal Medicine

## 2017-01-27 LAB — BASIC METABOLIC PANEL
BUN/Creatinine Ratio: 13 (ref 9–20)
BUN: 24 mg/dL (ref 6–24)
CALCIUM: 9.2 mg/dL (ref 8.7–10.2)
CO2: 28 mmol/L (ref 20–29)
CREATININE: 1.89 mg/dL — AB (ref 0.76–1.27)
Chloride: 102 mmol/L (ref 96–106)
GFR calc Af Amer: 45 mL/min/{1.73_m2} — ABNORMAL LOW (ref >59)
GFR, EST NON AFRICAN AMERICAN: 39 mL/min/{1.73_m2} — AB (ref >59)
GLUCOSE: 80 mg/dL (ref 65–99)
Potassium: 3.7 mmol/L (ref 3.5–5.2)
SODIUM: 147 mmol/L — AB (ref 134–144)

## 2017-01-27 NOTE — Telephone Encounter (Signed)
Noted  

## 2017-01-28 ENCOUNTER — Ambulatory Visit: Payer: Managed Care, Other (non HMO) | Admitting: Internal Medicine

## 2017-01-28 ENCOUNTER — Telehealth: Payer: Self-pay | Admitting: *Deleted

## 2017-01-28 ENCOUNTER — Encounter: Payer: Self-pay | Admitting: Internal Medicine

## 2017-01-28 ENCOUNTER — Encounter: Payer: Self-pay | Admitting: Gastroenterology

## 2017-01-28 VITALS — BP 118/74 | HR 64 | Temp 98.1°F | Ht 67.0 in | Wt 359.0 lb

## 2017-01-28 DIAGNOSIS — Z23 Encounter for immunization: Secondary | ICD-10-CM | POA: Diagnosis not present

## 2017-01-28 DIAGNOSIS — M10472 Other secondary gout, left ankle and foot: Secondary | ICD-10-CM

## 2017-01-28 DIAGNOSIS — Z1211 Encounter for screening for malignant neoplasm of colon: Secondary | ICD-10-CM | POA: Diagnosis not present

## 2017-01-28 DIAGNOSIS — J4489 Other specified chronic obstructive pulmonary disease: Secondary | ICD-10-CM

## 2017-01-28 DIAGNOSIS — N183 Chronic kidney disease, stage 3 unspecified: Secondary | ICD-10-CM

## 2017-01-28 DIAGNOSIS — J449 Chronic obstructive pulmonary disease, unspecified: Secondary | ICD-10-CM

## 2017-01-28 DIAGNOSIS — I480 Paroxysmal atrial fibrillation: Secondary | ICD-10-CM

## 2017-01-28 DIAGNOSIS — I5032 Chronic diastolic (congestive) heart failure: Secondary | ICD-10-CM | POA: Diagnosis not present

## 2017-01-28 MED ORDER — ALLOPURINOL 300 MG PO TABS: 300.0000 mg | ORAL_TABLET | Freq: Every day | ORAL | 3 refills | Status: DC

## 2017-01-28 NOTE — Assessment & Plan Note (Signed)
No recent symptoms On xarelto
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
## 2017-01-28 NOTE — Telephone Encounter (Signed)
Refill request for Allopurinol. I reviewed Medications and Dr. Amalia Hailey did not order. Return fax denying.
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
## 2017-01-28 NOTE — Assessment & Plan Note (Signed)
Should be back on ARB

## 2017-01-28 NOTE — Assessment & Plan Note (Addendum)
Seems to have stable DOE Continues with cardiologist and going to CHF clinic also He should be back on losartan for this and CKD----would stop the clonidine instead. Will review with cardiology  11/9 Dr [name] is concerned about the losartan and kidneys Will hold off

## 2017-01-28 NOTE — Assessment & Plan Note (Signed)
Quiet on the allopurinol  Will refill

## 2017-01-28 NOTE — Addendum Note (Signed)
Addended by: Pilar Grammes on: 01/28/2017 01:20 PM   Modules accepted: Orders

## 2017-01-30 ENCOUNTER — Telehealth (HOSPITAL_COMMUNITY): Payer: Self-pay | Admitting: Vascular Surgery

## 2017-01-30 ENCOUNTER — Telehealth: Payer: Self-pay

## 2017-01-30 NOTE — Telephone Encounter (Signed)
Per Dr [name], it is best not to start losartan. Dr Silvio Pate asked that I call pt. Left a message on vm with details.

## 2017-01-30 NOTE — Telephone Encounter (Signed)
Called pt to make NEW PT APPT in [facility]

## 2017-02-04 ENCOUNTER — Telehealth: Payer: Self-pay | Admitting: Internal Medicine

## 2017-02-04 NOTE — Telephone Encounter (Signed)
Spoke with pt, I advised him to call the insurance company to see if they can give him the formulary so we can advise the doctor which inhalers they will cover. Pt states he will call back with those options, he is calling his insurance right now. Will await a call back.

## 2017-02-06 ENCOUNTER — Other Ambulatory Visit: Payer: Self-pay | Admitting: Internal Medicine

## 2017-02-06 MED ORDER — BUDESONIDE-FORMOTEROL FUMARATE 80-4.5 MCG/ACT IN AERO: 2.0000 | INHALATION_SPRAY | Freq: Two times a day (BID) | RESPIRATORY_TRACT | 12 refills | Status: DC

## 2017-02-06 MED ORDER — BUDESONIDE-FORMOTEROL FUMARATE 80-4.5 MCG/ACT IN AERO: 2.0000 | INHALATION_SPRAY | Freq: Two times a day (BID) | RESPIRATORY_TRACT | 3 refills | Status: DC

## 2017-02-06 NOTE — Telephone Encounter (Signed)
Spoke with pt, advised him that I would [facility]. Nothing further is needed.

## 2017-02-06 NOTE — Telephone Encounter (Signed)
Patient calling stating he is going to get the Symbicort through [facility] and they will be sending [place] something so he can get it for a 3 month supply.  Patient's CB [phone].

## 2017-02-17 ENCOUNTER — Telehealth: Payer: Self-pay | Admitting: Internal Medicine

## 2017-02-17 ENCOUNTER — Ambulatory Visit: Payer: Self-pay

## 2017-02-17 NOTE — Telephone Encounter (Signed)
Pt c/o noted small amounts of clear to blood tinged mucous from nose. Pt c/o sinus congestion. Pt is on Xarelto. Advised to call back if blood amounts increase in nasal secretions.   Reason for Disposition . [1] Sinus congestion as part of a cold AND [2] present < 10 days  Answer Assessment - Initial Assessment Questions 1. LOCATION: "Where does it hurt?"      Says his teeth ache 2. ONSET: "When did the sinus pain start?"  (e.g., hours, days)      Sunday "got bad at yesterday" 3. SEVERITY: "How bad is the pain?"   (Scale 1-10; mild, moderate or severe)   - MILD (1-3): doesn't interfere with normal activities    - MODERATE (4-7): interferes with normal activities (e.g., work or school) or awakens from sleep   - SEVERE (8-10): excruciating pain and patient unable to do any normal activities    Tooth pain 5/10 thinks its a cavity started same day as the sinus congestion 4. RECURRENT SYMPTOM: "Have you ever had sinus problems before?" If so, ask: "When was the last time?" and "What happened that time?"      Yes have had sinus problems before last time was 2-3 years ago and was put on a Z pack 5. NASAL CONGESTION: "Is the nose blocked?" If so, ask, "Can you open it or must you breathe through the mouth?"     He can breathe through his nose 6. NASAL DISCHARGE: "Do you have discharge from your nose?" If so ask, "What color?"     Yes the color is clear and bright red. No puddle of blood , its on a wiped on tissue 7. FEVER: "Do you have a fever?" If so, ask: "What is it, how was it measured, and when did it start?"      No fever 8. OTHER SYMPTOMS: "Do you have any other symptoms?" (e.g., sore throat, cough, earache, difficulty breathing)     No other symptoms 9. PREGNANCY: "Is there any chance you are pregnant?" "When was your last menstrual period?"     n/a  Protocols used: SINUS PAIN OR CONGESTION-A-AH

## 2017-02-17 NOTE — Telephone Encounter (Signed)
Copied from [place]. Topic: Inquiry >> Feb 17, 2017  9:01 AM Scherrie Gerlach wrote: Reason for CRM: pt states he has a runny nose and it has been mixed with blood.  This just started yesterday.  Would like to know what you think.

## 2017-02-17 NOTE — Telephone Encounter (Signed)
Spoke to pt and advised per Dr Silvio Pate. Pt states bleeding is minimal and he will try at home remedies.

## 2017-02-17 NOTE — Telephone Encounter (Signed)
See other phone note

## 2017-02-17 NOTE — Telephone Encounter (Signed)
It sounds like minor bleeding at this point. It would be worth him trying nasal saline spray and can also use vaseline or neosporin on a q-tip around the inside of his nose. Most infections are viral and don't need treatment unless they persist for 10-14 days or more. If the bleeding is more significant, a visit with an ENT

## 2017-02-17 NOTE — Telephone Encounter (Signed)
Also please see home care note.

## 2017-02-18 ENCOUNTER — Other Ambulatory Visit: Payer: Self-pay

## 2017-02-18 ENCOUNTER — Emergency Department (HOSPITAL_COMMUNITY)
Admission: EM | Admit: 2017-02-18 | Discharge: 2017-02-18 | Disposition: A | Discharge status: Home / Self Care | Payer: Managed Care, Other (non HMO) | Attending: Emergency Medicine | Admitting: Emergency Medicine

## 2017-02-18 ENCOUNTER — Encounter (HOSPITAL_COMMUNITY): Payer: Self-pay | Admitting: Emergency Medicine

## 2017-02-18 DIAGNOSIS — J449 Chronic obstructive pulmonary disease, unspecified: Secondary | ICD-10-CM | POA: Insufficient documentation

## 2017-02-18 DIAGNOSIS — I130 Hypertensive heart and chronic kidney disease with heart failure and stage 1 through stage 4 chronic kidney disease, or unspecified chronic kidney disease: Secondary | ICD-10-CM | POA: Diagnosis not present

## 2017-02-18 DIAGNOSIS — I509 Heart failure, unspecified: Secondary | ICD-10-CM | POA: Insufficient documentation

## 2017-02-18 DIAGNOSIS — R0981 Nasal congestion: Secondary | ICD-10-CM | POA: Insufficient documentation

## 2017-02-18 DIAGNOSIS — Z87891 Personal history of nicotine dependence: Secondary | ICD-10-CM | POA: Insufficient documentation

## 2017-02-18 DIAGNOSIS — R040 Epistaxis: Secondary | ICD-10-CM | POA: Diagnosis not present

## 2017-02-18 DIAGNOSIS — N183 Chronic kidney disease, stage 3 (moderate): Secondary | ICD-10-CM | POA: Insufficient documentation

## 2017-02-18 DIAGNOSIS — Z79899 Other long term (current) drug therapy: Secondary | ICD-10-CM | POA: Insufficient documentation

## 2017-02-18 MED ORDER — OXYMETAZOLINE HCL 0.05 % NA SOLN: 1.0000 | Freq: Two times a day (BID) | NASAL | 0 refills | Status: DC

## 2017-02-18 NOTE — Discharge Instructions (Signed)
Take an over the counter nasal decongestant and any allergy medication (zyrtec, allegra, loratadine, etc).   For nose bleeds: use afrin spray into bleeding nose, soak a tissue with afrin and place in nose and apply pressure to tip of nose for 15-20 mins at least.  Use afrin for no more than 2-3 days. After 3 days, start using flonase for nasal congestion.   Return if you develop uncontrollable nose bleed, blood in back of throat, coughing blood or clots, vomiting

## 2017-02-18 NOTE — ED Triage Notes (Signed)
Pt arriving with complaints of sinus congestion, pt states he has noticed blood present when blowing his nose since Monday.

## 2017-02-18 NOTE — ED Provider Notes (Signed)
[facility] DEPT Provider Note   CSN: [id number] Arrival date & time: 02/18/17  0737     History   Chief Complaint Chief Complaint  Patient presents with  . Nasal Congestion    HPI Phillip [name] is a 56 y.o. male presents to ED for evaluation of bilateral, intermittent, nose bleed x 3 days. Reports "head cold" x 4 days with nasal congestion, rhinorrhea, PND, dry cough.  Has been taking OTC cold medicine. He is placing tissue paper in nares to stop bleeding. He woke up this morning and noticed blood inside CPAP mask and became concerned. He is on Xarelto. Denies nose trauma, light-headedness, nausea, vomiting, hematemesis, bloody sputum or cough, spitting up blood.   HPI  Past Medical History:  Diagnosis Date  . CHF (congestive heart failure) ([place]) 11/15   EF 40-45%, improved with follow-up  . COPD (chronic obstructive pulmonary disease) ([place])   . Gout   . Hypertension   . Hypertensive cardiovascular disease   . Morbid obesity ([place])   . OSA on CPAP   . Paroxysmal atrial fibrillation ([facility])   . PNA (pneumonia)   . Pulmonary embolism ([place]) 2010   left leg DVT    Patient Active Problem List   Diagnosis Date Noted  . Acute renal failure superimposed on stage 3 chronic kidney disease ([place])   . Asthma with COPD ([place])   . Chronic diastolic heart failure ([place])   . Long term (current) use of anticoagulants [Z79.01] 08/17/2015  . Hx pulmonary embolism 08/17/2015  . Deep vein thrombosis (DVT) of lower extremity ([place]) 08/17/2015  . Paroxysmal atrial fibrillation ([place]) 08/04/2015  . SVT (supraventricular tachycardia) ([place]) 06/22/2015  . Dyspnea on exertion 06/20/2015  . Morbid obesity ([place]) 03/07/2015  . Elevated troponin 02/20/2015  . Essential hypertension   . Gout   . Mild persistent asthma in adult without complication 10/62/6948  . OSA on CPAP 05/06/2014  . Chronic kidney disease, stage 3 ([place]) 05/06/2014  . Asthma with acute exacerbation  05/06/2014    Past Surgical History:  Procedure Laterality Date  . UMBILICAL HERNIA REPAIR  1992       Home Medications    Prior to Admission medications   Medication Sig Start Date End Date Taking? Authorizing Provider  albuterol (PROVENTIL HFA;VENTOLIN HFA) 108 (90 Base) MCG/ACT inhaler Inhale 2 puffs into the lungs every 4 (four) hours as needed for wheezing or shortness of breath. 12/01/16   Tanda Rockers, MD  albuterol (PROVENTIL) (2.5 MG/3ML) 0.083% nebulizer solution Take 3 mLs (2.5 mg total) by nebulization every 6 (six) hours as needed for wheezing or shortness of breath. 09/12/16   Tanda Rockers, MD  allopurinol (ZYLOPRIM) 300 MG tablet Take 1 tablet (300 mg total) daily by mouth. 01/28/17   [name], MD  budesonide-formoterol (SYMBICORT) 80-4.5 MCG/ACT inhaler Inhale 2 puffs 2 (two) times daily into the lungs. 02/06/17   Tanda Rockers, MD  carvedilol (COREG) 3.125 MG tablet Take 1 tablet (3.125 mg total) by mouth 2 (two) times daily with a meal. 12/24/16   Barton Dubois, MD  cloNIDine (CATAPRES) 0.1 MG tablet Take 1 tablet (0.1 mg total) by mouth 3 (three) times daily. 12/16/15   Janece Canterbury, MD  colchicine 0.6 MG tablet Take 1 tablet (0.6 mg total) by mouth daily. Patient taking differently: Take 0.6 mg by mouth daily as needed (gout).  07/07/16   Edrick Kins, DPM  hydrALAZINE (APRESOLINE) 50 MG tablet Take 50 mg by  mouth 2 (two) times daily.    [provider]  montelukast (SINGULAIR) 10 MG tablet Take 1 tablet (10 mg total) by mouth at bedtime. 12/16/16   Tanda Rockers, MD  NONFORMULARY OR COMPOUNDED ITEM Apply 1 drop topically daily. Nail Lacquer: Fluconazole 2% Terbinafine 1% DMSO 05/14/16   Evans, Dorathy Daft, DPM  OXYGEN Inhale 2 L into the lungs daily. 2lpm when needed per pt     [provider]  oxymetazoline (AFRIN NASAL SPRAY) 0.05 % nasal spray Place 1 spray into both nostrils 2 (two) times daily. 02/18/17   Kinnie Feil, PA-C    potassium chloride SA (K-DUR,KLOR-CON) 20 MEQ tablet Take 1.5 tablets (30 mEq total) by mouth daily. Patient taking differently: Take 20 mEq by mouth daily.  10/17/16   Belva Crome, MD  torsemide (DEMADEX) 20 MG tablet Take 3 tablets (60 mg total) by mouth 2 (two) times daily. 01/20/17 01/15/18  Belva Crome, MD  UNABLE TO FIND Med Name: CPAP with sleep    [provider]  XARELTO 20 MG TABS tablet TAKE 1 TABLET BY MOUTH DAILY WITH SUPPER 09/10/16   Belva Crome, MD    Family History Family History  Problem Relation Age of Onset  . Diabetes Father   . CAD Father   . Hypertension Other   . Diabetes Other   . Allergies Brother     Social History Social History   Tobacco Use  . Smoking status: Former Smoker    Packs/day: 0.25    Years: 1.00    Pack years: 0.25    Types: Cigarettes    Last attempt to quit: 07/23/1989    Years since quitting: 27.5  . Smokeless tobacco: Never Used  Substance Use Topics  . Alcohol use: No    Alcohol/week: 0.0 oz  . Drug use: No     Allergies   Patient has no known allergies.   Review of Systems Review of Systems  HENT: Positive for congestion, nosebleeds, postnasal drip, sinus pressure and sneezing.   Respiratory: Positive for cough.   All other systems reviewed and are negative.    Physical Exam Updated Vital Signs BP 140/76 (BP Location: Right Arm)   Pulse 87   Temp 97.9 F (36.6 C) (Oral)   Resp 18   Ht 5\' 9"  (1.753 m)   Wt (!) 160.1 kg (353 lb)   SpO2 96%   BMI 52.13 kg/m   Physical Exam  Constitutional: He is oriented to person, place, and time. He appears well-developed and well-nourished. No distress.  NAD. Obese male.   HENT:  Head: Normocephalic and atraumatic.  Right Ear: External ear normal.  Left Ear: External ear normal.  Moderate mucosal edema and erythema, no visualization of bleeding source on exam. No epistaxis currently.  Posterior oropharynx and tonsils normal No blood in posterior  oropharynx  Eyes: Conjunctivae and EOM are normal. No scleral icterus.  Neck: Normal range of motion. Neck supple.  No cervical adenopathy   Cardiovascular: Normal rate, regular rhythm, normal heart sounds and intact distal pulses.  No murmur heard. Pulmonary/Chest: Effort normal and breath sounds normal. He has no wheezes.  Musculoskeletal: Normal range of motion. He exhibits no deformity.  Neurological: He is alert and oriented to person, place, and time.  Skin: Skin is warm and dry. Capillary refill takes less than 2 seconds.  Psychiatric: He has a normal mood and affect. His behavior is normal. Judgment and thought content normal.  Nursing  note and vitals reviewed.    ED Treatments / Results  Labs (all labs ordered are listed, but only abnormal results are displayed) Labs Reviewed - No data to display  EKG  EKG Interpretation None       Radiology No results found.  Procedures Procedures (including critical care time)  Medications Ordered in ED Medications - No data to display   Initial Impression / Assessment and Plan / ED Course  I have reviewed the triage vital signs and the nursing notes.  Pertinent labs & imaging results that were available during my care of the patient were reviewed by me and considered in my medical decision making (see chart for details).    56 yo male on xarelto presents for intermittent bilateral epistaxis (L>R).  Epistaxis stopped in ED. No signs of posterior epistaxis or hypotension. Exam is reassuring, no bleed and no visualization of source of bleed. No blood in oropharynx. VS WNL .Provided patient instructions on how to stop nose bleed at home. Will d/c with afrin > flonase, decongestants. Discussed return precautions.   Final Clinical Impressions(s) / ED Diagnoses   Final diagnoses:  Nasal congestion  Left-sided epistaxis    ED Discharge Orders        Ordered    oxymetazoline (AFRIN NASAL SPRAY) 0.05 % nasal spray  2 times  daily     02/18/17 0824       Kinnie Feil, PA-C 02/18/17 [id number]    Lacretia Leigh, MD 02/18/17 [phone]

## 2017-02-23 ENCOUNTER — Telehealth: Payer: Self-pay | Admitting: *Deleted

## 2017-02-23 ENCOUNTER — Other Ambulatory Visit: Payer: Self-pay | Admitting: Internal Medicine

## 2017-02-23 NOTE — Telephone Encounter (Signed)
If this has just started it is likely viral and won't respond to antibiotics (we generally don't send antibiotics without a visit). If it persists, like over 7-10 days, or severe symptoms like high fever or SOB, then he should be seen Make sure his nosebleeds are better

## 2017-02-23 NOTE — Telephone Encounter (Signed)
Copied from [place]. Topic: General - Other >> Feb 23, 2017  9:11 AM Yvette Rack wrote: Reason for CRM: patient would like an antibiotic for URI/chest congestion she states that his mucus has turned yellowish green he would like for medicine to be sent to [facility] in [facility]

## 2017-02-23 NOTE — Telephone Encounter (Signed)
Spoke to pt. He will call us if he does not get better.   He said he bought some Afrin spray and used it for 3 days and the nosebleeds have stopped.

## 2017-03-04 ENCOUNTER — Other Ambulatory Visit: Payer: Self-pay | Admitting: Interventional Cardiology

## 2017-03-09 ENCOUNTER — Ambulatory Visit: Payer: Managed Care, Other (non HMO) | Admitting: Internal Medicine

## 2017-03-09 ENCOUNTER — Encounter: Payer: Self-pay | Admitting: Internal Medicine

## 2017-03-09 VITALS — BP 142/80 | HR 95 | Ht 69.0 in | Wt 376.0 lb

## 2017-03-09 DIAGNOSIS — J00 Acute nasopharyngitis [common cold]: Secondary | ICD-10-CM

## 2017-03-09 DIAGNOSIS — G4733 Obstructive sleep apnea (adult) (pediatric): Secondary | ICD-10-CM

## 2017-03-09 DIAGNOSIS — Z9989 Dependence on other enabling machines and devices: Secondary | ICD-10-CM | POA: Diagnosis not present

## 2017-03-09 DIAGNOSIS — J4530 Mild persistent asthma, uncomplicated: Secondary | ICD-10-CM

## 2017-03-09 MED ORDER — AMOXICILLIN-POT CLAVULANATE 875-125 MG PO TABS: 1.0000 | ORAL_TABLET | Freq: Two times a day (BID) | ORAL | 0 refills | Status: AC

## 2017-03-09 NOTE — Patient Instructions (Addendum)
Augmentin 875 mg take one pill twice daily  X 10 days - take at breakfast and supper with large glass of water.  It would help reduce the usual side effects (diarrhea and yeast infections) if you ate cultured yogurt at lunch.   For cough mucinex dm up to 1200 mg every 12 hours as needed    Call if not better and we'll schedule you a sinus CT next and ENT follow up if needed     Please schedule a follow up visit in 3 months but call sooner if needed  Late add:  Need to identify who is guiding him on cpap rx noting he stopped the 02 on his own and at the very least needs ono on cpap s 02

## 2017-03-09 NOTE — Progress Notes (Signed)
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 Acute Visit    Pt states every morning he hears a rattling in his chest, still having the sob while walking, still has an occ. cough but mainly non productive, only has chest tightness when he exerts himself, he states he does has some chest conegstion Denies fever, pt unsure if his recent trip [place] has anything to do with his symtpoms   worse x 3 weeks but better since in ER with dx with chf 09/15/16 and sleeping fine on cpap  Much better p lasix Some better after saba but mostly using neb and skipping hfa saba in action paln  Minimal rattling better p extra dose of diuretic rec Plan A = Automatic = Symbicort 80 Take 2 puffs first thing in am and then another 2 puffs about 12 hours later.  Plan B = Backup Only use your albuterol as a rescue medication to be used if you can't catch your breath Plan C = Crisis - only use your albuterol nebulizer if you first try Plan B           12/16/2016  f/u ov/Julanne Schlueter re: symbicort 80 plus prn sabas/ no 02 at all  Chief Complaint  Patient presents with  . Follow-up    Increased wheezing in the am x 2 months. He has been using his albuterol inhaler 2 x per day on average. He rarely uses his neb. He had some prod cough with yellow sputum 1 wk ago.    Off singulair since 03/2016 overall worse since but sleeps fine on cpap s 02  - s cpap wakes up gagging and "wheezing" rec Restart  singulair 10 mg each pm   Please see patient coordinator before you leave today  to schedule Internal medicine/ [facility] Change appt to Please schedule a follow up visit in 3 months but call sooner if needed      03/09/2017  f/u ov/Benicia Bergevin re:  Asthma/ uses cpap but not 02 -still has it but never uses  Chief Complaint  Patient presents with  . Follow-up    Breathing has been worse since he developed a cold Thanksgiving. He is coughing up some brown to blood tinged sputum.  He rarely uses his albuterol inhaler and has not used his neb.   head cold/ congestion bloody nasty mucus x sev weeks and more sob but not using much rescue at all  Increased doe but comfortable at rest and sleeping on cpap  No obvious day to day or daytime variability or assoc  mucus plugs or hemoptysis or cp or chest tightness, subjective wheeze or overt sinus or hb symptoms. No unusual exposure hx or h/o childhood pna/ asthma or knowledge of premature birth.  Sleeping ok on present cpap settings without nocturnal  or early am  exacerbation  of respiratory  c/o's or need for noct saba. Also denies any obvious fluctuation of symptoms with weather or environmental changes or other aggravating or alleviating factors except as outlined above   Current Allergies, Complete Past Medical History, Past Surgical History, Family History, and Social History were reviewed in [employer] [facility] record.  ROS  The following are not active complaints unless bolded Hoarseness, sore throat, dysphagia, dental problems, itching, sneezing,  nasal congestion or discharge of excess mucus or purulent secretions, ear ache,   fever, chills, sweats, unintended wt loss or wt gain, classically pleuritic or exertional cp,  orthopnea pnd or leg swelling, presyncope, palpitations, abdominal pain, anorexia, nausea, vomiting, diarrhea  or change in bowel habits or change in bladder habits, change in stools or change in urine, dysuria,  hematuria,  rash, arthralgias, visual complaints, headache, numbness, weakness or ataxia or problems with walking or coordination,  change in mood/affect or memory.        Current Meds  Medication Sig  . albuterol (PROVENTIL HFA;VENTOLIN HFA) 108 (90 Base) MCG/ACT inhaler Inhale 2 puffs into the lungs every 4 (four) hours as needed for wheezing or shortness of breath.  [name] albuterol (PROVENTIL) (2.5 MG/3ML) 0.083% nebulizer solution Take 3 mLs (2.5 mg total) by nebulization every 6 (six) hours as needed for wheezing or shortness of breath.  . allopurinol (ZYLOPRIM) 300 MG tablet Take 1 tablet (300 mg total) daily by mouth.  . budesonide-formoterol (SYMBICORT) 80-4.5 MCG/ACT inhaler Inhale 2 puffs 2 (two) times daily into the lungs.  . carvedilol (COREG) 3.125 MG tablet TAKE 1 TABLET (3.125 MG TOTAL) BY MOUTH 2 (TWO) TIMES DAILY WITH A MEAL.  . cloNIDine (CATAPRES) 0.1 MG tablet Take 1 tablet (0.1 mg total) by mouth 3 (three) times daily.  . colchicine 0.6 MG tablet Take 1 tablet (0.6 mg total) by mouth daily. (Patient taking differently: Take 0.6 mg by mouth daily as needed (gout). )  . hydrALAZINE (APRESOLINE) 50 MG tablet Take 50 mg by mouth 2 (two) times daily.  . montelukast (SINGULAIR) 10 MG tablet Take 1 tablet (10 mg total) by mouth at bedtime.  . NONFORMULARY OR COMPOUNDED ITEM Apply 1 drop topically daily. Nail Lacquer: Fluconazole 2% Terbinafine 1% DMSO (Patient not taking: Reported on 03/10/2017)  . OXYGEN Inhale 2 L into the lungs daily. 2lpm when needed per pt   . oxymetazoline (AFRIN NASAL SPRAY) 0.05 % nasal spray Place 1 spray into both nostrils 2 (two) times daily. (Patient taking differently: Place 1 spray into both nostrils 2 (two) times daily as needed for congestion. )  . potassium chloride SA (K-DUR,KLOR-CON) 20 MEQ tablet Take 1.5 tablets (30 mEq total) by mouth daily. (Patient taking differently: Take 20 mEq by mouth daily. )  . torsemide (DEMADEX) 20 MG tablet Take 3 tablets  (60 mg total) by mouth 2 (two) times daily.  [name] UNABLE TO FIND Med Name: CPAP with sleep  . XARELTO 20 MG TABS tablet TAKE 1 TABLET BY MOUTH DAILY WITH SUPPER                        Objective:   Physical Exam  Obese bm/ moderately hoarse   03/09/2017      376  12/16/2016        372  09/30/2016        358  07/21/2016        362  04/22/2016        374  01/21/2016      390  12/27/2015        387   09/10/2015       372   07/24/15 389 lb 6.4 oz (176.631 kg)  07/08/15 384 lb 3.2 oz (174.272 kg)  06/21/15 390 lb 8 oz (177.13 kg)    Vital signs reviewed - Note on arrival 02 sats  99% on RA         HEENT: nl dentition, turbinates bilaterally, and oropharynx. Nl external ear canals without cough reflex   NECK :  without JVD/Nodes/TM/ nl carotid upstrokes bilaterally   LUNGS: no acc muscle use,  Nl contour chest which is clear to A and P bilaterally without cough on insp or exp maneuvers   CV:  RRR  no s3 or murmur or increase in P2, and trace bilateral lower ext pitting edema  ABD:  Massively obese but soft and nontender with poor inspiratory excursion in the supine position. No bruits or organomegaly appreciated, bowel sounds nll  MS:  Nl gait/ ext warm without deformities, calf tenderness, cyanosis or clubbing No obvious joint restrictions   SKIN: warm and dry without lesions    NEURO:  alert, approp, nl sensorium with  no motor or cerebellar deficits apparent.                    Assessment:

## 2017-03-10 ENCOUNTER — Other Ambulatory Visit: Payer: Self-pay

## 2017-03-10 ENCOUNTER — Emergency Department (HOSPITAL_COMMUNITY): Payer: Managed Care, Other (non HMO)

## 2017-03-10 ENCOUNTER — Encounter: Payer: Self-pay | Admitting: Internal Medicine

## 2017-03-10 ENCOUNTER — Inpatient Hospital Stay (HOSPITAL_COMMUNITY)
Admission: EM | Admit: 2017-03-10 | Discharge: 2017-03-12 | DRG: 291 | Disposition: A | Discharge status: Home / Self Care | Payer: Managed Care, Other (non HMO) | Attending: Family Medicine | Admitting: Family Medicine

## 2017-03-10 ENCOUNTER — Encounter (HOSPITAL_COMMUNITY): Payer: Self-pay | Admitting: Nurse Practitioner

## 2017-03-10 ENCOUNTER — Telehealth: Payer: Self-pay | Admitting: *Deleted

## 2017-03-10 DIAGNOSIS — M109 Gout, unspecified: Secondary | ICD-10-CM | POA: Diagnosis present

## 2017-03-10 DIAGNOSIS — J4530 Mild persistent asthma, uncomplicated: Secondary | ICD-10-CM | POA: Diagnosis present

## 2017-03-10 DIAGNOSIS — J449 Chronic obstructive pulmonary disease, unspecified: Secondary | ICD-10-CM | POA: Diagnosis present

## 2017-03-10 DIAGNOSIS — Z6841 Body Mass Index (BMI) 40.0 and over, adult: Secondary | ICD-10-CM

## 2017-03-10 DIAGNOSIS — Z86711 Personal history of pulmonary embolism: Secondary | ICD-10-CM

## 2017-03-10 DIAGNOSIS — G4733 Obstructive sleep apnea (adult) (pediatric): Secondary | ICD-10-CM

## 2017-03-10 DIAGNOSIS — Z8249 Family history of ischemic heart disease and other diseases of the circulatory system: Secondary | ICD-10-CM

## 2017-03-10 DIAGNOSIS — Z833 Family history of diabetes mellitus: Secondary | ICD-10-CM | POA: Diagnosis not present

## 2017-03-10 DIAGNOSIS — J9601 Acute respiratory failure with hypoxia: Secondary | ICD-10-CM | POA: Diagnosis present

## 2017-03-10 DIAGNOSIS — I130 Hypertensive heart and chronic kidney disease with heart failure and stage 1 through stage 4 chronic kidney disease, or unspecified chronic kidney disease: Secondary | ICD-10-CM | POA: Diagnosis present

## 2017-03-10 DIAGNOSIS — T502X5A Adverse effect of carbonic-anhydrase inhibitors, benzothiadiazides and other diuretics, initial encounter: Secondary | ICD-10-CM | POA: Diagnosis present

## 2017-03-10 DIAGNOSIS — J069 Acute upper respiratory infection, unspecified: Secondary | ICD-10-CM | POA: Diagnosis present

## 2017-03-10 DIAGNOSIS — Z79899 Other long term (current) drug therapy: Secondary | ICD-10-CM

## 2017-03-10 DIAGNOSIS — I10 Essential (primary) hypertension: Secondary | ICD-10-CM | POA: Diagnosis not present

## 2017-03-10 DIAGNOSIS — N183 Chronic kidney disease, stage 3 (moderate): Secondary | ICD-10-CM | POA: Diagnosis not present

## 2017-03-10 DIAGNOSIS — I4892 Unspecified atrial flutter: Secondary | ICD-10-CM | POA: Diagnosis present

## 2017-03-10 DIAGNOSIS — Z87891 Personal history of nicotine dependence: Secondary | ICD-10-CM | POA: Diagnosis not present

## 2017-03-10 DIAGNOSIS — Z9989 Dependence on other enabling machines and devices: Principal | ICD-10-CM

## 2017-03-10 DIAGNOSIS — J00 Acute nasopharyngitis [common cold]: Secondary | ICD-10-CM | POA: Insufficient documentation

## 2017-03-10 DIAGNOSIS — I480 Paroxysmal atrial fibrillation: Secondary | ICD-10-CM | POA: Diagnosis present

## 2017-03-10 DIAGNOSIS — N184 Chronic kidney disease, stage 4 (severe): Secondary | ICD-10-CM | POA: Diagnosis present

## 2017-03-10 DIAGNOSIS — Y92230 Patient room in hospital as the place of occurrence of the external cause: Secondary | ICD-10-CM | POA: Diagnosis present

## 2017-03-10 DIAGNOSIS — I509 Heart failure, unspecified: Secondary | ICD-10-CM | POA: Diagnosis not present

## 2017-03-10 DIAGNOSIS — Z792 Long term (current) use of antibiotics: Secondary | ICD-10-CM

## 2017-03-10 DIAGNOSIS — I5043 Acute on chronic combined systolic (congestive) and diastolic (congestive) heart failure: Secondary | ICD-10-CM | POA: Diagnosis present

## 2017-03-10 DIAGNOSIS — Z86718 Personal history of other venous thrombosis and embolism: Secondary | ICD-10-CM

## 2017-03-10 DIAGNOSIS — Z7951 Long term (current) use of inhaled steroids: Secondary | ICD-10-CM

## 2017-03-10 DIAGNOSIS — Z9981 Dependence on supplemental oxygen: Secondary | ICD-10-CM | POA: Diagnosis not present

## 2017-03-10 DIAGNOSIS — Z7901 Long term (current) use of anticoagulants: Secondary | ICD-10-CM

## 2017-03-10 DIAGNOSIS — E876 Hypokalemia: Secondary | ICD-10-CM | POA: Diagnosis present

## 2017-03-10 LAB — CBC WITH DIFFERENTIAL/PLATELET
BASOS ABS: 0 10*3/uL (ref 0.0–0.1)
Basophils Relative: 0 %
EOS PCT: 1 %
Eosinophils Absolute: 0.1 10*3/uL (ref 0.0–0.7)
HEMATOCRIT: 37.1 % — AB (ref 39.0–52.0)
Hemoglobin: 11.7 g/dL — ABNORMAL LOW (ref 13.0–17.0)
LYMPHS ABS: 1.4 10*3/uL (ref 0.7–4.0)
LYMPHS PCT: 29 %
MCH: 27.5 pg (ref 26.0–34.0)
MCHC: 31.5 g/dL (ref 30.0–36.0)
MCV: 87.1 fL (ref 78.0–100.0)
Monocytes Absolute: 0.4 10*3/uL (ref 0.1–1.0)
Monocytes Relative: 8 %
NEUTROS ABS: 3 10*3/uL (ref 1.7–7.7)
Neutrophils Relative %: 62 %
PLATELETS: 196 10*3/uL (ref 150–400)
RBC: 4.26 MIL/uL (ref 4.22–5.81)
RDW: 15.8 % — AB (ref 11.5–15.5)
WBC: 4.9 10*3/uL (ref 4.0–10.5)

## 2017-03-10 LAB — BASIC METABOLIC PANEL
ANION GAP: 9 (ref 5–15)
BUN: 33 mg/dL — ABNORMAL HIGH (ref 6–20)
CALCIUM: 9 mg/dL (ref 8.9–10.3)
CO2: 30 mmol/L (ref 22–32)
Chloride: 102 mmol/L (ref 101–111)
Creatinine, Ser: 1.98 mg/dL — ABNORMAL HIGH (ref 0.61–1.24)
GFR, EST AFRICAN AMERICAN: 42 mL/min — AB (ref >60)
GFR, EST NON AFRICAN AMERICAN: 37 mL/min — AB (ref >60)
GLUCOSE: 96 mg/dL (ref 65–99)
Potassium: 3.3 mmol/L — ABNORMAL LOW (ref 3.5–5.1)
SODIUM: 141 mmol/L (ref 135–145)

## 2017-03-10 LAB — I-STAT TROPONIN, ED: TROPONIN I, POC: 0 ng/mL (ref 0.00–0.08)

## 2017-03-10 LAB — BRAIN NATRIURETIC PEPTIDE: B NATRIURETIC PEPTIDE 5: 675 pg/mL — AB (ref 0.0–100.0)

## 2017-03-10 MED ORDER — MOMETASONE FURO-FORMOTEROL FUM 100-5 MCG/ACT IN AERO
2.0000 | INHALATION_SPRAY | Freq: Two times a day (BID) | RESPIRATORY_TRACT | Status: DC
  Administered 2017-03-10 – 2017-03-12 (×5): 2 via RESPIRATORY_TRACT
  Filled 2017-03-10: qty 8.8

## 2017-03-10 MED ORDER — FUROSEMIDE 10 MG/ML IJ SOLN
60.0000 mg | INTRAMUSCULAR | Status: AC
  Administered 2017-03-10: 60 mg via INTRAVENOUS
  Filled 2017-03-10: qty 8

## 2017-03-10 MED ORDER — SODIUM CHLORIDE 0.9% FLUSH: 3.0000 mL | INTRAVENOUS | Status: DC | PRN

## 2017-03-10 MED ORDER — ZOLPIDEM TARTRATE 5 MG PO TABS
5.0000 mg | ORAL_TABLET | Freq: Every evening | ORAL | Status: DC | PRN
  Filled 2017-03-10: qty 1

## 2017-03-10 MED ORDER — CARVEDILOL 3.125 MG PO TABS
3.1250 mg | ORAL_TABLET | Freq: Two times a day (BID) | ORAL | Status: DC
  Administered 2017-03-10 – 2017-03-12 (×4): 3.125 mg via ORAL
  Filled 2017-03-10 (×5): qty 1

## 2017-03-10 MED ORDER — ONDANSETRON HCL 4 MG/2ML IJ SOLN: 4.0000 mg | Freq: Four times a day (QID) | INTRAMUSCULAR | Status: DC | PRN

## 2017-03-10 MED ORDER — FUROSEMIDE 10 MG/ML IJ SOLN
40.0000 mg | Freq: Two times a day (BID) | INTRAMUSCULAR | Status: DC
  Administered 2017-03-10 – 2017-03-12 (×4): 40 mg via INTRAVENOUS
  Filled 2017-03-10 (×4): qty 4

## 2017-03-10 MED ORDER — MONTELUKAST SODIUM 10 MG PO TABS
10.0000 mg | ORAL_TABLET | Freq: Every day | ORAL | Status: DC
  Administered 2017-03-10 – 2017-03-11 (×2): 10 mg via ORAL
  Filled 2017-03-10 (×2): qty 1

## 2017-03-10 MED ORDER — RIVAROXABAN 20 MG PO TABS
20.0000 mg | ORAL_TABLET | Freq: Every day | ORAL | Status: DC
  Administered 2017-03-10 – 2017-03-11 (×2): 20 mg via ORAL
  Filled 2017-03-10 (×3): qty 1

## 2017-03-10 MED ORDER — ALBUTEROL SULFATE (2.5 MG/3ML) 0.083% IN NEBU
2.5000 mg | INHALATION_SOLUTION | Freq: Four times a day (QID) | RESPIRATORY_TRACT | Status: DC | PRN
  Administered 2017-03-10 – 2017-03-11 (×4): 2.5 mg via RESPIRATORY_TRACT
  Filled 2017-03-10 (×4): qty 3

## 2017-03-10 MED ORDER — ALBUTEROL SULFATE (2.5 MG/3ML) 0.083% IN NEBU
5.0000 mg | INHALATION_SOLUTION | Freq: Once | RESPIRATORY_TRACT | Status: AC
  Administered 2017-03-10: 5 mg via RESPIRATORY_TRACT
  Filled 2017-03-10: qty 6

## 2017-03-10 MED ORDER — SODIUM CHLORIDE 0.9% FLUSH
3.0000 mL | Freq: Two times a day (BID) | INTRAVENOUS | Status: DC
  Administered 2017-03-10 – 2017-03-12 (×4): 3 mL via INTRAVENOUS

## 2017-03-10 MED ORDER — ACETAMINOPHEN 325 MG PO TABS: 650.0000 mg | ORAL_TABLET | ORAL | Status: DC | PRN

## 2017-03-10 MED ORDER — HYDRALAZINE HCL 50 MG PO TABS
50.0000 mg | ORAL_TABLET | Freq: Two times a day (BID) | ORAL | Status: DC
  Administered 2017-03-11 – 2017-03-12 (×3): 50 mg via ORAL
  Filled 2017-03-10 (×3): qty 1

## 2017-03-10 MED ORDER — ALLOPURINOL 300 MG PO TABS
300.0000 mg | ORAL_TABLET | Freq: Every day | ORAL | Status: DC
  Administered 2017-03-10 – 2017-03-12 (×3): 300 mg via ORAL
  Filled 2017-03-10 (×3): qty 1

## 2017-03-10 MED ORDER — OXYMETAZOLINE HCL 0.05 % NA SOLN: 1.0000 | Freq: Two times a day (BID) | NASAL | Status: DC | PRN

## 2017-03-10 MED ORDER — POTASSIUM CHLORIDE CRYS ER 20 MEQ PO TBCR
40.0000 meq | EXTENDED_RELEASE_TABLET | Freq: Two times a day (BID) | ORAL | Status: AC
  Administered 2017-03-10 (×2): 40 meq via ORAL
  Filled 2017-03-10 (×2): qty 2

## 2017-03-10 MED ORDER — CLONIDINE HCL 0.1 MG PO TABS
0.1000 mg | ORAL_TABLET | Freq: Three times a day (TID) | ORAL | Status: DC
  Administered 2017-03-10 – 2017-03-12 (×7): 0.1 mg via ORAL
  Filled 2017-03-10 (×7): qty 1

## 2017-03-10 MED ORDER — SODIUM CHLORIDE 0.9 % IV SOLN: 250.0000 mL | INTRAVENOUS | Status: DC | PRN

## 2017-03-10 MED ORDER — AMOXICILLIN-POT CLAVULANATE 875-125 MG PO TABS
1.0000 | ORAL_TABLET | Freq: Two times a day (BID) | ORAL | Status: DC
  Administered 2017-03-10 (×2): 1 via ORAL
  Filled 2017-03-10 (×2): qty 1

## 2017-03-10 NOTE — Telephone Encounter (Signed)
Spoke with the pt  He states he is unsure of who ordered CPAP and does not know who follows him on this  I advised needs ONO on CPAP without o2  He agreed to this and order sent to [facility]

## 2017-03-10 NOTE — ED Provider Notes (Signed)
Patient seen/examined in the Emergency Department in conjunction with Midlevel Provider [facility] Patient reports shortness of breath, concerning for worsened CHF Exam : awake/alert, decreased BS bilaterally limited due to body size  Plan: admit for CHF   Ripley Fraise, MD 03/10/17 [phone]

## 2017-03-10 NOTE — ED Notes (Signed)
Report given to floor rn 

## 2017-03-10 NOTE — H&P (Signed)
History and Physical    Octavian Godek FTD:[id number] DOB: 1961-02-17 DOA: 03/10/2017  I have briefly reviewed the patient's prior medical records in [place]  PCP: [name], MD  Patient coming from: Home  Chief Complaint: Shortness of breath  HPI: Phillip [name] is a 56 y.o. male with medical history significant of chronic combined systolic and diastolic CHF, COPD, hypertension, morbid obesity, obstructive sleep apnea, paroxysmal A. fib, prior history of DVT on chronic anticoagulation, presents to the emergency room with a chief complaint of progressive shortness of breath.  He traveled to [place] for Thanksgiving, and tells me that ever since he came back he felt a little bit more short winded than normal.  Over the last 3-4 days he has been having a sinus congestion/URI, for which she saw his pulmonologist Dr. Melvyn Novas and was given Augmentin.  He has been difficulties with maintaining good fluid status, and saw Dr. [name] his cardiologist in October and at that time his Demadex dose was increased.  Patient has had no fever or chills, denies any sore throat, denies any muscle aches or diffuse myalgias.  Feels like his sinus pressure is improving.  His breathing has gotten a lot worse in the last 2 days and he has been more and more short of breath with small distances decided to come to the ER.  He does appreciate that he has gained about 3 pounds in the last few days.  He is also noticed more lower extremity swelling.  He reports compliance with his diuretics, and mainly with his diet however ate out on Sunday.  Denies any chest pain however complains of abdominal "tightness", which is normal for him when he gets fluid buildup.  He denies any lightheadedness or dizziness.  ED Course: In the emergency room his vital signs are stable, he was found to be hypoxic on room air requiring supplemental oxygen.  Chest x-ray with increased vascular congestion and cardiomegaly suggesting a degree of  pulmonary edema.  His blood work is pertinent for a creatinine of 1.98, BNP of 675.  He was given IV Lasix and we were asked to admit  Review of Systems: As per HPI otherwise 10 point review of systems negative.   Past Medical History:  Diagnosis Date  . CHF (congestive heart failure) ([place]) 11/15   EF 40-45%, improved with follow-up  . COPD (chronic obstructive pulmonary disease) ([place])   . Gout   . Hypertension   . Hypertensive cardiovascular disease   . Morbid obesity ([place])   . OSA on CPAP   . Paroxysmal atrial fibrillation (HCC)   . PNA (pneumonia)   . Pulmonary embolism ([place]) 2010   left leg DVT    Past Surgical History:  Procedure Laterality Date  . [place]     reports that he quit smoking about 27 years ago. His smoking use included cigarettes. He has a 0.25 pack-year smoking history. he has never used smokeless tobacco. He reports that he does not drink alcohol or use drugs.  No Known Allergies  Family History  Problem Relation Age of Onset  . Diabetes Father   . CAD Father   . Hypertension Other   . Diabetes Other   . Allergies Brother     Prior to Admission medications   Medication Sig Start Date End Date Taking? Authorizing Provider  albuterol (PROVENTIL HFA;VENTOLIN HFA) 108 (90 Base) MCG/ACT inhaler Inhale 2 puffs into the lungs every 4 (four) hours as needed  for wheezing or shortness of breath. 12/01/16  Yes Tanda Rockers, MD  albuterol (PROVENTIL) (2.5 MG/3ML) 0.083% nebulizer solution Take 3 mLs (2.5 mg total) by nebulization every 6 (six) hours as needed for wheezing or shortness of breath. 09/12/16  Yes Tanda Rockers, MD  allopurinol (ZYLOPRIM) 300 MG tablet Take 1 tablet (300 mg total) daily by mouth. 01/28/17  Yes [name], MD  amoxicillin-clavulanate (AUGMENTIN) 875-125 MG tablet Take 1 tablet by mouth 2 (two) times daily for 10 days. 03/09/17 03/19/17 Yes Tanda Rockers, MD  budesonide-formoterol (SYMBICORT) 80-4.5  MCG/ACT inhaler Inhale 2 puffs 2 (two) times daily into the lungs. 02/06/17  Yes Tanda Rockers, MD  carvedilol (COREG) 3.125 MG tablet TAKE 1 TABLET (3.125 MG TOTAL) BY MOUTH 2 (TWO) TIMES DAILY WITH A MEAL. 02/23/17  Yes [name], MD  cloNIDine (CATAPRES) 0.1 MG tablet Take 1 tablet (0.1 mg total) by mouth 3 (three) times daily. 12/16/15  Yes Short, Noah Delaine, MD  colchicine 0.6 MG tablet Take 1 tablet (0.6 mg total) by mouth daily. Patient taking differently: Take 0.6 mg by mouth daily as needed (gout).  07/07/16  Yes Edrick Kins, DPM  hydrALAZINE (APRESOLINE) 50 MG tablet Take 50 mg by mouth 2 (two) times daily.   Yes [provider]  montelukast (SINGULAIR) 10 MG tablet Take 1 tablet (10 mg total) by mouth at bedtime. 12/16/16  Yes Tanda Rockers, MD  OXYGEN Inhale 2 L into the lungs daily. 2lpm when needed per pt    Yes [provider]  oxymetazoline (AFRIN NASAL SPRAY) 0.05 % nasal spray Place 1 spray into both nostrils 2 (two) times daily. Patient taking differently: Place 1 spray into both nostrils 2 (two) times daily as needed for congestion.  02/18/17  Yes Carmon Sails J, PA-C  potassium chloride SA (K-DUR,KLOR-CON) 20 MEQ tablet Take 1.5 tablets (30 mEq total) by mouth daily. Patient taking differently: Take 20 mEq by mouth daily.  10/17/16  Yes Belva Crome, MD  torsemide (DEMADEX) 20 MG tablet Take 3 tablets (60 mg total) by mouth 2 (two) times daily. 01/20/17 01/15/18 Yes Belva Crome, MD  UNABLE TO FIND Med Name: CPAP with sleep   Yes [provider]  XARELTO 20 MG TABS tablet TAKE 1 TABLET BY MOUTH DAILY WITH SUPPER 03/04/17  Yes Belva Crome, MD  NONFORMULARY OR COMPOUNDED ITEM Apply 1 drop topically daily. Nail Lacquer: Fluconazole 2% Terbinafine 1% DMSO Patient not taking: Reported on 03/10/2017 05/14/16   Edrick Kins, DPM    Physical Exam: Vitals:   03/10/17 0321 03/10/17 0510 03/10/17 0600 03/10/17 0700  BP:  (!) 145/89 (!)  158/98 (!) 163/86  Pulse: 88 89 87 89  Resp: 18 15 (!) 23 (!) 35  Temp:      TempSrc:      SpO2: 96% 95% 93% 95%  Weight:      Height:        Constitutional: NAD Eyes: PERRL, lids and conjunctivae normal ENMT: Mucous membranes are moist. Posterior pharynx clear of any exudate or lesions. Neck: Thick Respiratory: clear to auscultation bilaterally, no wheezing, no crackles, however distant breath sounds and exam overall difficult due to morbid obesity. Normal respiratory effort. No accessory muscle use.  Cardiovascular: Regular rate and rhythm, no murmurs / rubs / gallops. 1+ extremity edema.  Cannot appreciate JVD due to body habitus Abdomen: no tenderness, no masses palpated. Bowel sounds positive.  Musculoskeletal: no clubbing /  cyanosis. Normal muscle tone.  Skin: no rashes, lesions, ulcers. No induration Neurologic: CN 2-12 grossly intact. Strength 5/5 in all 4.  Psychiatric: Normal judgment and insight. Alert and oriented x 3. Normal mood.   Labs on Admission: I have personally reviewed following labs and imaging studies  Chest x-ray: Cardiomegaly, pulmonary vascular congestion  CBC: Recent Labs  Lab 03/10/17 0448  WBC 4.9  NEUTROABS 3.0  HGB 11.7*  HCT 37.1*  MCV 87.1  PLT 314   Basic Metabolic Panel: Recent Labs  Lab 03/10/17 0448  NA 141  K 3.3*  CL 102  CO2 30  GLUCOSE 96  BUN 33*  CREATININE 1.98*  CALCIUM 9.0   GFR: Estimated Creatinine Clearance: 66.8 mL/min (A) (by C-G formula based on SCr of 1.98 mg/dL (H)). Liver Function Tests: No results for input(s): AST, ALT, ALKPHOS, BILITOT, PROT, ALBUMIN in the last 168 hours. No results for input(s): LIPASE, AMYLASE in the last 168 hours. No results for input(s): AMMONIA in the last 168 hours. Coagulation Profile: No results for input(s): INR, PROTIME in the last 168 hours. Cardiac Enzymes: No results for input(s): CKTOTAL, CKMB, CKMBINDEX, TROPONINI in the last 168 hours. BNP (last 3 results) Recent  Labs    09/30/16 1452  PROBNP 877.0*   HbA1C: No results for input(s): HGBA1C in the last 72 hours. CBG: No results for input(s): GLUCAP in the last 168 hours. Lipid Profile: No results for input(s): CHOL, HDL, LDLCALC, TRIG, CHOLHDL, LDLDIRECT in the last 72 hours. Thyroid Function Tests: No results for input(s): TSH, T4TOTAL, FREET4, T3FREE, THYROIDAB in the last 72 hours. Anemia Panel: No results for input(s): VITAMINB12, FOLATE, FERRITIN, TIBC, IRON, RETICCTPCT in the last 72 hours. Urine analysis:    Component Value Date/Time   COLORURINE YELLOW 02/04/2016 1234   APPEARANCEUR CLEAR 02/04/2016 1234   LABSPEC 1.011 02/04/2016 1234   PHURINE 7.5 02/04/2016 1234   GLUCOSEU NEGATIVE 02/04/2016 1234   HGBUR NEGATIVE 02/04/2016 1234   BILIRUBINUR NEGATIVE 02/04/2016 1234   KETONESUR NEGATIVE 02/04/2016 1234   PROTEINUR NEGATIVE 02/04/2016 1234   UROBILINOGEN 1.0 12/04/2014 0546   NITRITE NEGATIVE 02/04/2016 1234   LEUKOCYTESUR NEGATIVE 02/04/2016 1234     Radiological Exams on Admission: Dg Chest 2 View  Result Date: 03/10/2017 CLINICAL DATA:  Shortness of breath. EXAM: CHEST  2 VIEW COMPARISON:  12/20/2016 FINDINGS: Cardiomegaly is similar. Slight increased vascular congestion from prior exam. No pleural effusion. No confluent airspace disease or pneumothorax. No acute osseous abnormalities. IMPRESSION: Similar cardiomegaly. Increased vascular congestion. Findings suggest fluid overload/CHF. Electronically Signed   By: Jeb Levering M.D.   On: 03/10/2017 04:02    EKG: Independently reviewed.  Sinus rhythm  Assessment/Plan Active Problems:   Mild persistent asthma in adult without complication   OSA on CPAP   Chronic kidney disease, stage 3 (HCC)   Essential hypertension   Gout   Morbid obesity due to excess calories (HCC)   Paroxysmal atrial fibrillation (HCC)   Long term (current) use of anticoagulants [Z79.01]   CHF (congestive heart failure) (HCC)    Acute  on chronic combined systolic and diastolic CHF -Patient has received IV Lasix in the ER, continue twice daily, daily weights, strict I's and O's -Perhaps a component of dietary noncompliance is at play -Most recent 2D echo was done recently in September 2018 and showed EF of 55-60%, and grade 2 diastolic dysfunction, EF slightly improved from prior studies -Resume home Coreg, not on ACE inhibitors due to chronic kidney disease  Acute hypoxic respiratory failure due to acute pulmonary edema due to CHF -Lasix, wean oxygen as tolerated  Hypertension -Hypertensive in the ED, resume Coreg, resume clonidine and home hydralazine  Paroxysmal A. fib -Continue Xarelto, CHADS Vasc score > 2  Chronic kidney disease stage III -Baseline creatinine around 2 in 2018, currently at baseline, closely monitor with Lasix  Asthma /recent URI -No wheezing, this seems to be stable at this point, continue home medications -Continue Augmentin as per outpatient plan  Obstructive sleep apnea -Continue nightly CPAP  History of DVT -Continue Xarelto  Hypokalemia -Due to diuretics, supplement and recheck  Morbid obesity -will need significant weight loss   DVT prophylaxis: Xarelto  Code Status: Full code  Family Communication: no family at bedside Disposition Plan: Admit to telemetry, home when stable Consults called: None    Admission status: Inpatient   At the time of admission, it appears that the appropriate admission status for this patient is INPATIENT. This is judged to be reasonable and necessary in order to provide the required high service intensity to ensure the patient's safety given the presenting symptoms, physical exam findings, and initial radiographic and laboratory data in the context of their chronic comorbidities. Current circumstances are acute CHF, acute respiratory failure, and it is felt to place patient at high risk for further clinical deterioration threatening life, limb, or  organ. Moreover, it is my clinical judgment that the patient will require inpatient hospital care spanning beyond 2 midnights from the point of admission and that early discharge would result in unnecessary risk of decompensation and readmission or threat to life, limb or bodily function.   Marzetta Board, MD [facility] Pager [phone]  If 7PM-7AM, please contact night-coverage [email or web address] Password [email or web address]  03/10/2017, 8:18 AM

## 2017-03-10 NOTE — Telephone Encounter (Signed)
-----   Message from Tanda Rockers, MD sent at 03/10/2017  6:13 AM EST ----- Need to identify who is guiding him on cpap rx noting he stopped the 02 on his own and at the very least needs ono on cpap s 02

## 2017-03-10 NOTE — ED Provider Notes (Signed)
[facility] DEPT Provider Note   CSN: [id number] Arrival date & time: 03/10/17  0256     History   Chief Complaint Chief Complaint  Patient presents with  . Shortness of Breath    HPI Phillip [name] is a 56 y.o. male.  The history is provided by the patient and medical records.    56 year old male with history of CHF with estimated EF of 40-45%, gout, COPD, hypertension, paroxysmal A. fib on Xarelto, presenting to the ED with shortness of breath.  Patient reports symptoms have been progressively worsening since Saturday night, 3 days ago.  States he has somewhat chronic cough, states slightly worse than normal with some associated nasal congestion.  States yesterday his shortness of breath significantly worsened so he made an appointment with his pulmonologist.  He was seen in the clinic and on arrival there had oxygen saturations of 85%.  He does not use home oxygen.  States after talking with the doctor they felt he most likely had pneumonia, but could not rule out if there was a component of his CHF.  States he was started on antibiotics and sent home with follow-up.  States throughout the night he is continued to feel worse.  He is having orthopnea, lower extremity edema and dyspnea on exertion.  States by the time he walks from his bed to the bathroom he is completely give out and winded.  States he did see his cardiologist, Dr. [name], within the past months and his Demadex was increased from 40 mg twice daily to 60 mg twice daily.  States he has been referred to the heart failure clinic to see Dr. Haroldine Laws for tighter management of his CHF.  He denies any fever, chills, or sweats.  Past Medical History:  Diagnosis Date  . CHF (congestive heart failure) ([place]) 11/15   EF 40-45%, improved with follow-up  . COPD (chronic obstructive pulmonary disease) ([place])   . Gout   . Hypertension   . Hypertensive cardiovascular disease   . Morbid obesity ([place])   . OSA  on CPAP   . Paroxysmal atrial fibrillation (HCC)   . PNA (pneumonia)   . Pulmonary embolism ([place]) 2010   left leg DVT    Patient Active Problem List   Diagnosis Date Noted  . Acute renal failure superimposed on stage 3 chronic kidney disease ([place])   . Asthma with COPD ([place])   . Chronic diastolic heart failure ([place])   . Long term (current) use of anticoagulants [Z79.01] 08/17/2015  . Hx pulmonary embolism 08/17/2015  . Deep vein thrombosis (DVT) of lower extremity ([place]) 08/17/2015  . Paroxysmal atrial fibrillation ([place]) 08/04/2015  . SVT (supraventricular tachycardia) ([place]) 06/22/2015  . Dyspnea on exertion 06/20/2015  . Morbid obesity ([place]) 03/07/2015  . Elevated troponin 02/20/2015  . Essential hypertension   . Gout   . Mild persistent asthma in adult without complication 46/96/2952  . OSA on CPAP 05/06/2014  . Chronic kidney disease, stage 3 ([place]) 05/06/2014  . Asthma with acute exacerbation 05/06/2014    Past Surgical History:  Procedure Laterality Date  . UMBILICAL HERNIA REPAIR  1992       Home Medications    Prior to Admission medications   Medication Sig Start Date End Date Taking? Authorizing Provider  albuterol (PROVENTIL HFA;VENTOLIN HFA) 108 (90 Base) MCG/ACT inhaler Inhale 2 puffs into the lungs every 4 (four) hours as needed for wheezing or shortness of breath. 12/01/16   Tanda Rockers, MD  albuterol (PROVENTIL) (2.5 MG/3ML) 0.083% nebulizer solution Take 3 mLs (2.5 mg total) by nebulization every 6 (six) hours as needed for wheezing or shortness of breath. 09/12/16   Tanda Rockers, MD  allopurinol (ZYLOPRIM) 300 MG tablet Take 1 tablet (300 mg total) daily by mouth. 01/28/17   [name], MD  amoxicillin-clavulanate (AUGMENTIN) 875-125 MG tablet Take 1 tablet by mouth 2 (two) times daily for 10 days. 03/09/17 03/19/17  Tanda Rockers, MD  budesonide-formoterol (SYMBICORT) 80-4.5 MCG/ACT inhaler Inhale 2 puffs 2 (two) times daily into the lungs.  02/06/17   Tanda Rockers, MD  carvedilol (COREG) 3.125 MG tablet TAKE 1 TABLET (3.125 MG TOTAL) BY MOUTH 2 (TWO) TIMES DAILY WITH A MEAL. 02/23/17   [name], MD  cloNIDine (CATAPRES) 0.1 MG tablet Take 1 tablet (0.1 mg total) by mouth 3 (three) times daily. 12/16/15   Janece Canterbury, MD  colchicine 0.6 MG tablet Take 1 tablet (0.6 mg total) by mouth daily. Patient taking differently: Take 0.6 mg by mouth daily as needed (gout).  07/07/16   Edrick Kins, DPM  hydrALAZINE (APRESOLINE) 50 MG tablet Take 50 mg by mouth 2 (two) times daily.    [provider]  montelukast (SINGULAIR) 10 MG tablet Take 1 tablet (10 mg total) by mouth at bedtime. 12/16/16   Tanda Rockers, MD  NONFORMULARY OR COMPOUNDED ITEM Apply 1 drop topically daily. Nail Lacquer: Fluconazole 2% Terbinafine 1% DMSO 05/14/16   Evans, Dorathy Daft, DPM  OXYGEN Inhale 2 L into the lungs daily. 2lpm when needed per pt     [provider]  oxymetazoline (AFRIN NASAL SPRAY) 0.05 % nasal spray Place 1 spray into both nostrils 2 (two) times daily. Patient taking differently: Place 1 spray into both nostrils 2 (two) times daily as needed.  02/18/17   Kinnie Feil, PA-C  potassium chloride SA (K-DUR,KLOR-CON) 20 MEQ tablet Take 1.5 tablets (30 mEq total) by mouth daily. Patient taking differently: Take 20 mEq by mouth daily.  10/17/16   Belva Crome, MD  torsemide (DEMADEX) 20 MG tablet Take 3 tablets (60 mg total) by mouth 2 (two) times daily. 01/20/17 01/15/18  Belva Crome, MD  UNABLE TO FIND Med Name: CPAP with sleep    [provider]  XARELTO 20 MG TABS tablet TAKE 1 TABLET BY MOUTH DAILY WITH SUPPER 03/04/17   Belva Crome, MD    Family History Family History  Problem Relation Age of Onset  . Diabetes Father   . CAD Father   . Hypertension Other   . Diabetes Other   . Allergies Brother     Social History Social History   Tobacco Use  . Smoking status: Former Smoker    Packs/day:  0.25    Years: 1.00    Pack years: 0.25    Types: Cigarettes    Last attempt to quit: 07/23/1989    Years since quitting: 27.6  . Smokeless tobacco: Never Used  Substance Use Topics  . Alcohol use: No    Alcohol/week: 0.0 oz  . Drug use: No     Allergies   Patient has no known allergies.   Review of Systems Review of Systems  Respiratory: Positive for cough and shortness of breath.   All other systems reviewed and are negative.    Physical Exam Updated Vital Signs BP (!) 166/89 (BP Location: Left Arm)   Pulse 88   Temp 98.5 F (36.9 C) (Oral)  Resp 18   Ht 5\' 9"  (1.753 m)   Wt (!) 170.6 kg (376 lb)   SpO2 96%   BMI 55.53 kg/m   Physical Exam  Constitutional: He is oriented to person, place, and time. He appears well-developed and well-nourished.  Morbidly obese  HENT:  Head: Normocephalic and atraumatic.  Mouth/Throat: Oropharynx is clear and moist.  Eyes: Conjunctivae and EOM are normal. Pupils are equal, round, and reactive to light.  Neck: Normal range of motion.  Cardiovascular: Normal rate, regular rhythm and normal heart sounds.  Pulmonary/Chest: Effort normal. He has rales.  Rales at the bases, no acute distress, able to speak in full sentences without difficulty, O2 sat 96% on RA during exam  Abdominal: Soft. Bowel sounds are normal.  Musculoskeletal: Normal range of motion.  1+ pitting edema bilaterally  Neurological: He is alert and oriented to person, place, and time.  Skin: Skin is warm and dry.  Psychiatric: He has a normal mood and affect.  Nursing note and vitals reviewed.    ED Treatments / Results  Labs (all labs ordered are listed, but only abnormal results are displayed) Labs Reviewed  CBC WITH DIFFERENTIAL/PLATELET - Abnormal; Notable for the following components:      Result Value   Hemoglobin 11.7 (*)    HCT 37.1 (*)    RDW 15.8 (*)    All other components within normal limits  BASIC METABOLIC PANEL - Abnormal; Notable for the  following components:   Potassium 3.3 (*)    BUN 33 (*)    Creatinine, Ser 1.98 (*)    GFR calc non Af Amer 37 (*)    GFR calc Af Amer 42 (*)    All other components within normal limits  BRAIN NATRIURETIC PEPTIDE - Abnormal; Notable for the following components:   B Natriuretic Peptide 675.0 (*)    All other components within normal limits  I-STAT TROPONIN, ED    EKG  EKG Interpretation None       Radiology Dg Chest 2 View  Result Date: 03/10/2017 CLINICAL DATA:  Shortness of breath. EXAM: CHEST  2 VIEW COMPARISON:  12/20/2016 FINDINGS: Cardiomegaly is similar. Slight increased vascular congestion from prior exam. No pleural effusion. No confluent airspace disease or pneumothorax. No acute osseous abnormalities. IMPRESSION: Similar cardiomegaly. Increased vascular congestion. Findings suggest fluid overload/CHF. Electronically Signed   By: Jeb Levering M.D.   On: 03/10/2017 04:02    Procedures Procedures (including critical care time)  Medications Ordered in ED Medications  albuterol (PROVENTIL) (2.5 MG/3ML) 0.083% nebulizer solution 5 mg (5 mg Nebulization Given 03/10/17 0442)  furosemide (LASIX) injection 60 mg (60 mg Intravenous Given 03/10/17 0618)     Initial Impression / Assessment and Plan / ED Course  I have reviewed the triage vital signs and the nursing notes.  Pertinent labs & imaging results that were available during my care of the patient were reviewed by me and considered in my medical decision making (see chart for details).  56 year old male presenting to the ED with shortness of breath.  Has been ongoing since the weekend but progressively worsening.  Seen by pulmonologist yesterday and diagnosed with probable pneumonia, however patient states it feels more like his heart failure.  States his cardiologist has already increased his home Lasix but he has not had much relief.  Reports nighttime orthopnea and lower extremity edema as well as dyspnea on  exertion. .  I reviewed his visit from pulmonology office yesterday, it appears he was  saturating 85% on room air in the clinic.  He does not use home oxygen.  Chest x-ray obtained from triage revealing vascular congestion and fluid overload.  While talking with patient in the room saturations were okay at around 96%.  His exam is somewhat limited due to his very large body habitus, however does seem to have some rales at the bases.  We will plan for screening labs.  May need admission for IV diuresis.  6:06 AM Patient reassessed.  Upon entering room he is only saturating 88% on RA just while sitting on bed.  I have reviewed his labs which are consistent with CHF.  At this point given hypoxia at rest, feel he will require admission for IV diuresis.  Discussed with hospitalist, Dr. Hal Hope-- patient will be admitted for ongoing care.  Final Clinical Impressions(s) / ED Diagnoses   Final diagnoses:  Acute on chronic congestive heart failure, unspecified heart failure type [facility])    ED Discharge Orders    None       Larene Pickett, PA-C 03/10/17 8016    Ripley Fraise, MD 03/10/17 [phone]

## 2017-03-10 NOTE — Assessment & Plan Note (Signed)
Body mass index is 55.53 kg/m.  -  trending up Lab Results  Component Value Date   TSH 1.499 12/21/2016     Contributing to gerd risk/ doe/reviewed the need and the process to achieve and maintain neg calorie balance > defer f/u primary care including intermittently monitoring thyroid status

## 2017-03-10 NOTE — ED Notes (Signed)
Patient transported to X-ray 

## 2017-03-10 NOTE — ED Provider Notes (Signed)
ED ECG REPORT   Date: 03/10/2017 0327am  Rate: 90  Rhythm: atrial flutter  QRS Axis: left  Intervals: QT prolonged  ST/T Wave abnormalities: nonspecific ST changes  Conduction Disutrbances:none   I have personally reviewed the EKG tracing and agree with the computerized printout as noted.    Ripley Fraise, MD 03/10/17 [phone]

## 2017-03-10 NOTE — Assessment & Plan Note (Signed)
Need to identify who is guiding him on cpap rx noting he stopped the 02 on his own and at the very least needs ono on cpap s 02 to be sure the problem of noct hypoxemia is being addressed adequately on his present settings

## 2017-03-10 NOTE — ED Triage Notes (Signed)
Patient presents today with c/o sinus congestion and shortness of breath. Patient states he notices blood tinge when he coughs or blows nose, otherwise mucous is yellow tinged. He did see his pulmonologist Dr. Melvyn Novas on yesterday who prescribed him an antibiotic. Patient reports having taken his first doses on yesterday. He is ambulatory and in no obvious respiratory distress. Able to speak in complete sentences. Patient reports states he feels an increasing shortness of breath with activity. Denies any headaches, chest pain, fever, or chills. States he was taking Robitussin for the cough but stopped along with Mucinex DM. Denies any pain. O2 sats is 96% RA.

## 2017-03-10 NOTE — Assessment & Plan Note (Addendum)
singulair maint rx as of 09/10/2015 with NO = 31 / no saba or other controller needed  - admit 12/14/15 with flare ? Assoc with chf/ cap - PFT's  01/21/2016  FEV1 2.11 (71 % ) ratio 84  p 12 % improvement from saba p breo 100 and on last day of pred taper  prior to study with DLCO  73/82  % corrects to 120 % for alv volume  - 01/21/2016    change to symbicort  160 2bid  - FENO 04/22/2016  =   16 on symb 160 with sub optimal technique and singuair > d/c'd singulair  - 04/22/2016    try symbicort 80 2bid - Allergy profile  09/30/16  >  Eos 0.0 /  IgE 7 RAST neg     - 09/30/2016  After extensive coaching HFA effectiveness =    90% - restart singulair 10 mg each am 12/16/2016 >>>  Mild flare in setting of probable rhinitis ? Sinusitis > rec no change in pulmonary meds/ reviewed approp rx with prns   See sep a/p

## 2017-03-10 NOTE — Assessment & Plan Note (Signed)
03/09/2017 rx augmentin x 10 days then sinus CT if not improved

## 2017-03-11 ENCOUNTER — Ambulatory Visit: Payer: Managed Care, Other (non HMO) | Admitting: Podiatry

## 2017-03-11 LAB — BASIC METABOLIC PANEL
Anion gap: 5 (ref 5–15)
BUN: 32 mg/dL — AB (ref 6–20)
CO2: 32 mmol/L (ref 22–32)
Calcium: 8.7 mg/dL — ABNORMAL LOW (ref 8.9–10.3)
Chloride: 106 mmol/L (ref 101–111)
Creatinine, Ser: 1.99 mg/dL — ABNORMAL HIGH (ref 0.61–1.24)
GFR calc Af Amer: 42 mL/min — ABNORMAL LOW (ref >60)
GFR, EST NON AFRICAN AMERICAN: 36 mL/min — AB (ref >60)
GLUCOSE: 102 mg/dL — AB (ref 65–99)
Potassium: 3.6 mmol/L (ref 3.5–5.1)
Sodium: 143 mmol/L (ref 135–145)

## 2017-03-11 MED ORDER — AMOXICILLIN-POT CLAVULANATE 875-125 MG PO TABS
1.0000 | ORAL_TABLET | Freq: Two times a day (BID) | ORAL | Status: DC
  Administered 2017-03-11 – 2017-03-12 (×3): 1 via ORAL
  Filled 2017-03-11 (×3): qty 1

## 2017-03-11 MED ORDER — IPRATROPIUM-ALBUTEROL 0.5-2.5 (3) MG/3ML IN SOLN
3.0000 mL | Freq: Two times a day (BID) | RESPIRATORY_TRACT | Status: DC
  Administered 2017-03-12: 3 mL via RESPIRATORY_TRACT
  Filled 2017-03-11: qty 3

## 2017-03-11 MED ORDER — FUROSEMIDE 10 MG/ML IJ SOLN
40.0000 mg | Freq: Once | INTRAMUSCULAR | Status: AC
  Administered 2017-03-11: 40 mg via INTRAVENOUS
  Filled 2017-03-11: qty 4

## 2017-03-11 NOTE — Progress Notes (Signed)
PROGRESS NOTE  Tori Cupps  TKW:[id number] DOB: [birth year] DOA: 03/10/2017 PCP: [name], MD   Brief Narrative: Cerrone Debold is a 56 y.o. male with a history of chronic combined CHF, COPD, morbid obesity, OSA, PAF, h/o DVT, and HTN who presented to the ED for progressive dyspnea that began around Thanksgiving. He traveled to [place] for Thanksgiving, and tells me that ever since he came back he felt a little bit more short winded than normal.  Over the last 3-4 days he has been having a sinus congestion/URI, for which she saw his pulmonologist Dr. Melvyn Novas and was given Augmentin.  He has been difficulties with maintaining good fluid status, and saw Dr. [name] his cardiologist in October and at that time his Demadex dose was increased.  Patient has had no fever or chills, denies any sore throat, denies any muscle aches or diffuse myalgias.  Feels like his sinus pressure is improving.  His breathing has gotten a lot worse in the last 2 days and he has been more and more short of breath with small distances decided to come to the ER.  He does appreciate that he has gained about 3 pounds in the last few days.  He is also noticed more lower extremity swelling.  He reports compliance with his diuretics, and mainly with his diet however ate out on Sunday.  Denies any chest pain however complains of abdominal "tightness", which is normal for him when he gets fluid buildup.  He denies any lightheadedness or dizziness.  ED Course: In the emergency room his vital signs are stable, he was found to be hypoxic on room air requiring supplemental oxygen.  Chest x-ray with increased vascular congestion and cardiomegaly suggesting a degree of pulmonary edema.  His blood work is pertinent for a creatinine of 1.98, BNP of 675.  He was given IV Lasix and we were asked to admit  Assessment & Plan: Active Problems:   Mild persistent asthma in adult without complication   OSA on CPAP   Chronic kidney disease, stage 3  (HCC)   Essential hypertension   Gout   Morbid obesity due to excess calories (HCC)   Paroxysmal atrial fibrillation (HCC)   Long term (current) use of anticoagulants [Z79.01]   CHF (congestive heart failure) ([place])  Acute on chronic diastolic CHF: Possibly due to lack of salt restriction. Echo Sept 2018 showed EF 55-60% (improved), G2DD - Continue lasix IV BID, creatinine stable, weight down.  - Continue strict I/O, daily weights.  - Nutrition consulted, salt-restricted diet - Resume home coreg, not on ACE inhibitors due to chronic kidney disease  Acute hypoxic respiratory failure due to acute pulmonary edema due to CHF: - Lasix, wean oxygen as tolerated  Hypertension:  - Continue home coreg, clonidine, hydralazine  Paroxysmal A. fib:  - Continue xarelto, coreg. Rate controlled.  Stage III CKD: Cr at baseline ~2.  - Monitor BMP   Asthma: with recent URI. No wheezing.  - Continue augmentin prescribed as outpatient.    Obstructive sleep apnea - CPAP qHS  History of DVT:  - Continue xarelto  Hypokalemia: Resolved with replacement.  - Monitor while on lasix  Morbid obesity:  - Nutrition consulted.  DVT prophylaxis: Xarelto Code Status: Full Family Communication: None at bedside Disposition Plan: Home once improved.   Consultants:   None  Procedures:   None  Antimicrobials:  Augmentin started as outpatient   Subjective: Breathing still worse than baseline, but better than admission. Legs still a  little swollen.  Objective: Vitals:   03/11/17 0609 03/11/17 0637 03/11/17 0832 03/11/17 1312  BP: (!) 141/55   (!) 145/82  Pulse: 88   72  Resp: 20   18  Temp: (!) 97.5 F (36.4 C)   98.4 F (36.9 C)  TempSrc: Oral   Oral  SpO2: 100% 95% 99% 98%  Weight:      Height:        Intake/Output Summary (Last 24 hours) at 03/11/2017 1507 Last data filed at 03/11/2017 1324 Gross per 24 hour  Intake 480 ml  Output 2125 ml  Net -1645 ml   Filed  Weights   03/10/17 0315 03/10/17 1554  Weight: (!) 170.6 kg (376 lb) (!) 168.2 kg (370 lb 13 oz)    Gen: Pleasant, morbidly obese male in no distress Pulm: Non-labored breathing 2L O2. Distant bilaterally.  CV: Irreg. No murmur, rub, or gallop. No JVD, trace pedal edema. GI: Abdomen soft, non-tender, non-distended, with normoactive bowel sounds. No organomegaly or masses felt. Ext: Warm, no deformities Skin: No rashes, lesions no ulcers Neuro: Alert and oriented. No focal neurological deficits. Psych: Judgement and insight appear normal. Mood & affect appropriate.   Data Reviewed: I have personally reviewed following labs and imaging studies  CBC: Recent Labs  Lab 03/10/17 0448  WBC 4.9  NEUTROABS 3.0  HGB 11.7*  HCT 37.1*  MCV 87.1  PLT 130   Basic Metabolic Panel: Recent Labs  Lab 03/10/17 0448 03/11/17 0430  NA 141 143  K 3.3* 3.6  CL 102 106  CO2 30 32  GLUCOSE 96 102*  BUN 33* 32*  CREATININE 1.98* 1.99*  CALCIUM 9.0 8.7*   GFR: Estimated Creatinine Clearance: 65.8 mL/min (A) (by C-G formula based on SCr of 1.99 mg/dL (H)). Liver Function Tests: No results for input(s): AST, ALT, ALKPHOS, BILITOT, PROT, ALBUMIN in the last 168 hours. No results for input(s): LIPASE, AMYLASE in the last 168 hours. No results for input(s): AMMONIA in the last 168 hours. Coagulation Profile: No results for input(s): INR, PROTIME in the last 168 hours. Cardiac Enzymes: No results for input(s): CKTOTAL, CKMB, CKMBINDEX, TROPONINI in the last 168 hours. BNP (last 3 results) Recent Labs    09/30/16 1452  PROBNP 877.0*   HbA1C: No results for input(s): HGBA1C in the last 72 hours. CBG: No results for input(s): GLUCAP in the last 168 hours. Lipid Profile: No results for input(s): CHOL, HDL, LDLCALC, TRIG, CHOLHDL, LDLDIRECT in the last 72 hours. Thyroid Function Tests: No results for input(s): TSH, T4TOTAL, FREET4, T3FREE, THYROIDAB in the last 72 hours. Anemia Panel: No  results for input(s): VITAMINB12, FOLATE, FERRITIN, TIBC, IRON, RETICCTPCT in the last 72 hours. Urine analysis:    Component Value Date/Time   COLORURINE YELLOW 02/04/2016 [place] 02/04/2016 1234   LABSPEC 1.011 02/04/2016 1234   PHURINE 7.5 02/04/2016 1234   GLUCOSEU NEGATIVE 02/04/2016 1234   HGBUR NEGATIVE 02/04/2016 1234   BILIRUBINUR NEGATIVE 02/04/2016 1234   KETONESUR NEGATIVE 02/04/2016 1234   PROTEINUR NEGATIVE 02/04/2016 1234   UROBILINOGEN 1.0 12/04/2014 0546   NITRITE NEGATIVE 02/04/2016 1234   LEUKOCYTESUR NEGATIVE 02/04/2016 1234   No results found for this or any previous visit (from the past 240 hour(s)).    Radiology Studies: Dg Chest 2 View  Result Date: 03/10/2017 CLINICAL DATA:  Shortness of breath. EXAM: CHEST  2 VIEW COMPARISON:  12/20/2016 FINDINGS: Cardiomegaly is similar. Slight increased vascular congestion from prior exam. No pleural effusion.  No confluent airspace disease or pneumothorax. No acute osseous abnormalities. IMPRESSION: Similar cardiomegaly. Increased vascular congestion. Findings suggest fluid overload/CHF. Electronically Signed   By: Jeb Levering M.D.   On: 03/10/2017 04:02    Scheduled Meds: . allopurinol  300 mg Oral Daily  . amoxicillin-clavulanate  1 tablet Oral BID  . carvedilol  3.125 mg Oral BID WC  . cloNIDine  0.1 mg Oral TID  . furosemide  40 mg Intravenous Q12H  . hydrALAZINE  50 mg Oral BID  . mometasone-formoterol  2 puff Inhalation BID  . montelukast  10 mg Oral QHS  . rivaroxaban  20 mg Oral Q supper  . sodium chloride flush  3 mL Intravenous Q12H   Continuous Infusions: . sodium chloride       LOS: 1 day   Time spent: 25 minutes.  Vance Gather, MD [facility] Pager [phone]  If 7PM-7AM, please contact night-coverage [email or web address] Password [email or web address] 03/11/2017, 3:07 PM

## 2017-03-12 DIAGNOSIS — I509 Heart failure, unspecified: Secondary | ICD-10-CM

## 2017-03-12 DIAGNOSIS — I10 Essential (primary) hypertension: Secondary | ICD-10-CM

## 2017-03-12 DIAGNOSIS — Z9989 Dependence on other enabling machines and devices: Secondary | ICD-10-CM

## 2017-03-12 DIAGNOSIS — Z7901 Long term (current) use of anticoagulants: Secondary | ICD-10-CM

## 2017-03-12 DIAGNOSIS — I480 Paroxysmal atrial fibrillation: Secondary | ICD-10-CM

## 2017-03-12 DIAGNOSIS — G4733 Obstructive sleep apnea (adult) (pediatric): Secondary | ICD-10-CM

## 2017-03-12 DIAGNOSIS — N183 Chronic kidney disease, stage 3 (moderate): Secondary | ICD-10-CM

## 2017-03-12 LAB — BASIC METABOLIC PANEL
ANION GAP: 6 (ref 5–15)
BUN: 33 mg/dL — ABNORMAL HIGH (ref 6–20)
CALCIUM: 8.7 mg/dL — AB (ref 8.9–10.3)
CHLORIDE: 106 mmol/L (ref 101–111)
CO2: 31 mmol/L (ref 22–32)
Creatinine, Ser: 2.02 mg/dL — ABNORMAL HIGH (ref 0.61–1.24)
GFR calc Af Amer: 41 mL/min — ABNORMAL LOW (ref >60)
GFR calc non Af Amer: 36 mL/min — ABNORMAL LOW (ref >60)
GLUCOSE: 99 mg/dL (ref 65–99)
Potassium: 3.3 mmol/L — ABNORMAL LOW (ref 3.5–5.1)
Sodium: 143 mmol/L (ref 135–145)

## 2017-03-12 MED ORDER — OXYMETAZOLINE HCL 0.05 % NA SOLN: 1.0000 | Freq: Two times a day (BID) | NASAL | Status: DC | PRN

## 2017-03-12 MED ORDER — POTASSIUM CHLORIDE CRYS ER 20 MEQ PO TBCR
20.0000 meq | EXTENDED_RELEASE_TABLET | Freq: Once | ORAL | Status: AC
Start: 2017-03-12 — End: 2017-03-12
  Administered 2017-03-12: 20 meq via ORAL
  Filled 2017-03-12: qty 1

## 2017-03-12 NOTE — H&P (Signed)
Physician Discharge Summary  Brigg Cape YWV:[id number] DOB: 14-Sep-1960 DOA: 03/10/2017  PCP: [name], MD  Admit date: 03/10/2017 Discharge date: 03/12/2017  Time spent: 35  minutes  Recommendations for Outpatient Follow-up:  1. follow-up PCP in two weeks  Discharge Diagnoses:  Active Problems:   Mild persistent asthma in adult without complication   OSA on CPAP   Chronic kidney disease, stage 3 (HCC)   Essential hypertension   Gout   Morbid obesity due to excess calories (HCC)   Paroxysmal atrial fibrillation (HCC)   Long term (current) use of anticoagulants [Z79.01]   CHF (congestive heart failure) ([place])   Discharge Condition: stable  Diet recommendation: heart healthy diet  Filed Weights   03/10/17 0315 03/10/17 1554 03/12/17 0639  Weight: (!) 170.6 kg (376 lb) (!) 168.2 kg (370 lb 13 oz) (!) 167 kg (368 lb 2.7 oz)    History of present illness:  56 y.o. male with a history of chronic combined CHF, COPD, morbid obesity, OSA, PAF, h/o DVT, and HTN who presented to the ED for progressive dyspnea that began around Thanksgiving. He traveled to [place] for Thanksgiving, and tells me that ever since he came back he felt a little bit more short winded than normal. Over the last 3-4 days he has been having a sinus congestion/URI, for which she saw his pulmonologist Dr. Nonnie Done was given Augmentin. He has been difficulties with maintaining good fluid status, and saw Dr. [name] his cardiologist in October and at that time his Demadex dose was increased. Patient has had no fever or chills, denies any sore throat, denies any muscle aches or diffuse myalgias. Feels like his sinus pressure is improving. His breathing has gotten a lot worse in the last 2 days and he has been more and more short of breath with small distances decided to come to the ER. He does appreciate that he has gained about 3 pounds in the last few days. He is also noticed more lower extremity swelling.  He reports compliance with his diuretics, and mainly with his diet however ate out on Sunday. Denies any chest pain however complains of abdominal "tightness", which is normal for him when he gets fluid buildup.He denies any lightheadedness or dizziness.    Hospital Course:  Acute and chronic diastolic CHF-patient presented with acute exacerbation of diastolic CHF. Echocardiogram from 2018 showed EF 55 to 60%, patient was started on IV Lasix twice a day. Lasix has been stopped all. Patient is back to his baseline. He will continue with torsemide 60 mg twice a day at home. Along with potassium. Today potassium was 3.3, will replace potassium before discharge.  Hypertension-blood pressure is stable, continue coated, chlorine, idolizing.  Paroxysmal atrial fibrillation-continue Xarelto, correct. Heart rate is controlled.  Stage III CKD-creatinine at baseline around 2.  Asthma-patient was prescribed Augmentin is outpatient. Will continue with Augmentin to complete the course.  Obstructive sleep apnea to continue CPAP QHS  History of DVT-continue anticoagulants and with Xarelto    Procedures:  none  Consultations:  none  Discharge Exam: Vitals:   03/12/17 0639 03/12/17 0939  BP: (!) 153/87   Pulse: 89   Resp: 18   Temp: 97.9 F (36.6 C)   SpO2: 99% 99%    General: appears in no acute distress Cardiovascular: S1 S2, regular Respiratory: clear to auscultation bilaterally  Discharge Instructions   Discharge Instructions    Diet - low sodium heart healthy   Complete by:  As directed  Increase activity slowly   Complete by:  As directed      Allergies as of 03/12/2017   No Known Allergies     Medication List    STOP taking these medications   NONFORMULARY OR COMPOUNDED ITEM     TAKE these medications   albuterol (2.5 MG/3ML) 0.083% nebulizer solution Commonly known as:  PROVENTIL Take 3 mLs (2.5 mg total) by nebulization every 6 (six) hours as needed  for wheezing or shortness of breath.   albuterol 108 (90 Base) MCG/ACT inhaler Commonly known as:  PROVENTIL HFA;VENTOLIN HFA Inhale 2 puffs into the lungs every 4 (four) hours as needed for wheezing or shortness of breath.   allopurinol 300 MG tablet Commonly known as:  ZYLOPRIM Take 1 tablet (300 mg total) daily by mouth.   amoxicillin-clavulanate 875-125 MG tablet Commonly known as:  AUGMENTIN Take 1 tablet by mouth 2 (two) times daily for 10 days.   budesonide-formoterol 80-4.5 MCG/ACT inhaler Commonly known as:  SYMBICORT Inhale 2 puffs 2 (two) times daily into the lungs.   carvedilol 3.125 MG tablet Commonly known as:  COREG TAKE 1 TABLET (3.125 MG TOTAL) BY MOUTH 2 (TWO) TIMES DAILY WITH A MEAL.   cloNIDine 0.1 MG tablet Commonly known as:  CATAPRES Take 1 tablet (0.1 mg total) by mouth 3 (three) times daily.   colchicine 0.6 MG tablet Take 1 tablet (0.6 mg total) by mouth daily. What changed:    when to take this  reasons to take this   hydrALAZINE 50 MG tablet Commonly known as:  APRESOLINE Take 50 mg by mouth 2 (two) times daily.   montelukast 10 MG tablet Commonly known as:  SINGULAIR Take 1 tablet (10 mg total) by mouth at bedtime.   OXYGEN Inhale 2 L into the lungs daily. 2lpm when needed per pt   oxymetazoline 0.05 % nasal spray Commonly known as:  AFRIN NASAL SPRAY Place 1 spray into both nostrils 2 (two) times daily as needed for congestion.   potassium chloride SA 20 MEQ tablet Commonly known as:  K-DUR,KLOR-CON Take 1.5 tablets (30 mEq total) by mouth daily. What changed:  how much to take   torsemide 20 MG tablet Commonly known as:  DEMADEX Take 3 tablets (60 mg total) by mouth 2 (two) times daily.   UNABLE TO FIND Med Name: CPAP with sleep   XARELTO 20 MG Tabs tablet Generic drug:  rivaroxaban TAKE 1 TABLET BY MOUTH DAILY WITH SUPPER      No Known Allergies    The results of significant diagnostics from this hospitalization  (including imaging, microbiology, ancillary and laboratory) are listed below for reference.    Significant Diagnostic Studies: Dg Chest 2 View  Result Date: 03/10/2017 CLINICAL DATA:  Shortness of breath. EXAM: CHEST  2 VIEW COMPARISON:  12/20/2016 FINDINGS: Cardiomegaly is similar. Slight increased vascular congestion from prior exam. No pleural effusion. No confluent airspace disease or pneumothorax. No acute osseous abnormalities. IMPRESSION: Similar cardiomegaly. Increased vascular congestion. Findings suggest fluid overload/CHF. Electronically Signed   By: Jeb Levering M.D.   On: 03/10/2017 04:02    Microbiology: No results found for this or any previous visit (from the past 240 hour(s)).   Labs: Basic Metabolic Panel: Recent Labs  Lab 03/10/17 0448 03/11/17 0430 03/12/17 0411  NA 141 143 143  K 3.3* 3.6 3.3*  CL 102 106 106  CO2 30 32 31  GLUCOSE 96 102* 99  BUN 33* 32* 33*  CREATININE 1.98* 1.99* 2.02*  CALCIUM 9.0 8.7* 8.7*   Liver Function Tests: No results for input(s): AST, ALT, ALKPHOS, BILITOT, PROT, ALBUMIN in the last 168 hours. No results for input(s): LIPASE, AMYLASE in the last 168 hours. No results for input(s): AMMONIA in the last 168 hours. CBC: Recent Labs  Lab 03/10/17 0448  WBC 4.9  NEUTROABS 3.0  HGB 11.7*  HCT 37.1*  MCV 87.1  PLT 196   Cardiac Enzymes: No results for input(s): CKTOTAL, CKMB, CKMBINDEX, TROPONINI in the last 168 hours. BNP: BNP (last 3 results) Recent Labs    12/20/16 0301 12/21/16 0634 03/10/17 0448  BNP 748.0* 1,091.6* 675.0*    ProBNP (last 3 results) Recent Labs    09/30/16 1452  PROBNP 877.0*        Signed:  Oswald Hillock MD.  [facility] 03/12/2017, 11:09 AM

## 2017-03-12 NOTE — Plan of Care (Signed)
  Cardiac: Ability to achieve and maintain adequate cardiopulmonary perfusion will improve 03/12/2017 0800 - Progressing by Joel Cowin, Sherryll Burger, RN

## 2017-03-13 ENCOUNTER — Telehealth: Payer: Self-pay

## 2017-03-13 NOTE — Telephone Encounter (Signed)
Transition Care Management Follow-up Telephone Call   Date discharged? 03/12/2017   How have you been since you were released from the hospital? "I'm doing pretty good"   Do you understand why you were in the hospital? yes, "Congestive heart failure"   Do you understand the discharge instructions? yes   Where were you discharged to? Home. Lives alone.    Items Reviewed:  Medications reviewed: no, Did not have list/meds at time of call. States no changes.   Allergies reviewed: yes  Dietary changes reviewed: yes  Referrals reviewed: yes   Functional Questionnaire:   Activities of Daily Living (ADLs):   He states they are independent in the following: ambulation, bathing and hygiene, feeding, continence, grooming, toileting and dressing States they require assistance with the following: None.    Any transportation issues/concerns?: no   Any patient concerns? no   Confirmed importance and date/time of follow-up visits scheduled yes  Provider Appointment booked with: Advised office will call back with appt date/time.   Confirmed with patient if condition begins to worsen call PCP or go to the ER.  Patient was given the office number and encouraged to call back with question or concerns.  : yes

## 2017-03-13 NOTE — Telephone Encounter (Signed)
Scheduled patient for follow up with Dr. Silvio Pate 03/19/17 at 2:00pm.  Thanks.

## 2017-03-13 NOTE — Telephone Encounter (Signed)
okay

## 2017-03-19 ENCOUNTER — Encounter: Payer: Self-pay | Admitting: Internal Medicine

## 2017-03-19 ENCOUNTER — Ambulatory Visit: Payer: Managed Care, Other (non HMO) | Admitting: Internal Medicine

## 2017-03-19 VITALS — BP 154/76 | HR 96 | Temp 98.2°F | Wt 362.0 lb

## 2017-03-19 DIAGNOSIS — I5032 Chronic diastolic (congestive) heart failure: Secondary | ICD-10-CM | POA: Diagnosis not present

## 2017-03-19 NOTE — Assessment & Plan Note (Signed)
Recent exacerbation Better now Seems to be back to his baseline weight Discussed increasing torsemide for weight up 3# Will be going to the CHF clinic

## 2017-03-19 NOTE — Progress Notes (Signed)
Subjective:    Patient ID: Phillip [name], male    DOB: Nov 22, 1960, 56 y.o.   MRN: [id number]  HPI Here for hospital follow up  He is feeling better but still notes some SOB He noticed increased weight and DOE Diagnosed with exacerbation of diastolic CHF Diuresed with IV furosemide Weighing daily now--- 355# at home. Was up as high at 370# Now back home on same dose of torsemide 60 bid  Tries to be careful with eating Uses Mrs Dash---no salt  No chest pain Gets palpitations sometimes--usually if he walks at faster pace Does have follow up with cardiology  Current Outpatient Medications on File Prior to Visit  Medication Sig Dispense Refill  . albuterol (PROVENTIL HFA;VENTOLIN HFA) 108 (90 Base) MCG/ACT inhaler Inhale 2 puffs into the lungs every 4 (four) hours as needed for wheezing or shortness of breath. 1 Inhaler 2  . albuterol (PROVENTIL) (2.5 MG/3ML) 0.083% nebulizer solution Take 3 mLs (2.5 mg total) by nebulization every 6 (six) hours as needed for wheezing or shortness of breath. 360 mL 11  . allopurinol (ZYLOPRIM) 300 MG tablet Take 1 tablet (300 mg total) daily by mouth. 90 tablet 3  . amoxicillin-clavulanate (AUGMENTIN) 875-125 MG tablet Take 1 tablet by mouth 2 (two) times daily for 10 days. 20 tablet 0  . budesonide-formoterol (SYMBICORT) 80-4.5 MCG/ACT inhaler Inhale 2 puffs 2 (two) times daily into the lungs. 3 Inhaler 3  . carvedilol (COREG) 3.125 MG tablet TAKE 1 TABLET (3.125 MG TOTAL) BY MOUTH 2 (TWO) TIMES DAILY WITH A MEAL. 180 tablet 1  . cloNIDine (CATAPRES) 0.1 MG tablet Take 1 tablet (0.1 mg total) by mouth 3 (three) times daily. 90 tablet 0  . colchicine 0.6 MG tablet Take 1 tablet (0.6 mg total) by mouth daily. (Patient taking differently: Take 0.6 mg by mouth daily as needed (gout). ) 30 tablet 0  . hydrALAZINE (APRESOLINE) 50 MG tablet Take 50 mg by mouth 2 (two) times daily.    . montelukast (SINGULAIR) 10 MG tablet Take 1 tablet (10 mg total) by mouth at  bedtime. 30 tablet 11  . OXYGEN Inhale 2 L into the lungs daily. 2lpm when needed per pt     . oxymetazoline (AFRIN NASAL SPRAY) 0.05 % nasal spray Place 1 spray into both nostrils 2 (two) times daily as needed for congestion.    . potassium chloride SA (K-DUR,KLOR-CON) 20 MEQ tablet Take 1.5 tablets (30 mEq total) by mouth daily. (Patient taking differently: Take 20 mEq by mouth daily. ) 90 tablet 3  . torsemide (DEMADEX) 20 MG tablet Take 3 tablets (60 mg total) by mouth 2 (two) times daily. 540 tablet 3  . UNABLE TO FIND Med Name: CPAP with sleep    . XARELTO 20 MG TABS tablet TAKE 1 TABLET BY MOUTH DAILY WITH SUPPER 30 tablet 5   No current facility-administered medications on file prior to visit.     No Known Allergies  Past Medical History:  Diagnosis Date  . CHF (congestive heart failure) ([place]) 11/15   EF 40-45%, improved with follow-up  . COPD (chronic obstructive pulmonary disease) ([place])   . Gout   . Hypertension   . Hypertensive cardiovascular disease   . Morbid obesity ([place])   . OSA on CPAP   . Paroxysmal atrial fibrillation (HCC)   . PNA (pneumonia)   . Pulmonary embolism ([place]) 2010   left leg DVT    Past Surgical History:  Procedure Laterality Date  . UMBILICAL  HERNIA REPAIR  1992    Family History  Problem Relation Age of Onset  . Diabetes Father   . CAD Father   . Hypertension Other   . Diabetes Other   . Allergies Brother     Social History   Socioeconomic History  . Marital status: Divorced    Spouse name: Not on file  . Number of children: 2  . Years of education: Not on file  . Highest education level: Not on file  Social Needs  . Financial resource strain: Not on file  . Food insecurity - worry: Not on file  . Food insecurity - inability: Not on file  . Transportation needs - medical: Not on file  . Transportation needs - non-medical: Not on file  Occupational History  . 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 Occupation: Airline pilot     Comment: Disabled  Tobacco Use  . Smoking status: Former Smoker    Packs/day: 0.25    Years: 1.00    Pack years: 0.25    Types: Cigarettes    Last attempt to quit: 07/23/1989    Years since quitting: 27.6  . Smokeless tobacco: Never Used  Substance and Sexual Activity  . Alcohol use: No    Alcohol/week: 0.0 oz  . Drug use: No  . Sexual activity: Not Currently  Other Topics Concern  . Not on file  Social History Narrative   Pt lives in [place], alone.   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   Has 2 sons in [place]      No living will   Would want sons to make decisions for him   Would accept resuscitation   Review of Systems Appetite is still good--- had feeling of fullness when fluid had built up Sleeping with his CPAP--awakens at night for nocturia but not PND Sleeps flat in bed    Objective:   Physical Exam  Constitutional: No distress.  Neck: No thyromegaly present.  Cardiovascular: Normal rate, regular rhythm, normal heart sounds and intact distal pulses. Exam reveals no gallop.  No murmur heard. Pulmonary/Chest: Breath sounds normal. No respiratory distress. He has no wheezes. He has no rales.  No dullness  Musculoskeletal: He exhibits no edema.  Lymphadenopathy:    He has no cervical adenopathy.          Assessment & Plan:

## 2017-03-19 NOTE — Patient Instructions (Addendum)
If your home weight is 358# or above, please increase the torsemide to 4 tabs twice a day.   Low-Sodium Eating Plan Sodium, which is an element that makes up salt, helps you maintain a healthy balance of fluids in your body. Too much sodium can increase your blood pressure and cause fluid and waste to be held in your body. Your health care provider or dietitian may recommend following this plan if you have high blood pressure (hypertension), kidney disease, liver disease, or heart failure. Eating less sodium can help lower your blood pressure, reduce swelling, and protect your heart, liver, and kidneys. What are tips for following this plan? General guidelines  Most people on this plan should limit their sodium intake to 1,500-2,000 mg (milligrams) of sodium each day. Reading food labels  The Nutrition Facts label lists the amount of sodium in one serving of the food. If you eat more than one serving, you must multiply the listed amount of sodium by the number of servings.  Choose foods with less than 140 mg of sodium per serving.  Avoid foods with 300 mg of sodium or more per serving. Shopping  Look for lower-sodium products, often labeled as "low-sodium" or "no salt added."  Always check the sodium content even if foods are labeled as "unsalted" or "no salt added".  Buy fresh foods. ? Avoid canned foods and premade or frozen meals. ? Avoid canned, cured, or processed meats  Buy breads that have less than 80 mg of sodium per slice. Cooking  Eat more home-cooked food and less restaurant, buffet, and fast food.  Avoid adding salt when cooking. Use salt-free seasonings or herbs instead of table salt or sea salt. Check with your health care provider or pharmacist before using salt substitutes.  Cook with plant-based oils, such as canola, sunflower, or olive oil. Meal planning  When eating at a restaurant, ask that your food be prepared with less salt or no salt, if  possible.  Avoid foods that contain MSG (monosodium glutamate). MSG is sometimes added to [place] food, bouillon, and some canned foods. What foods are recommended? The items listed may not be a complete list. Talk with your dietitian about what dietary choices are best for you. Grains Low-sodium cereals, including oats, puffed wheat and rice, and shredded wheat. Low-sodium crackers. Unsalted rice. Unsalted pasta. Low-sodium bread. Whole-grain breads and whole-grain pasta. Vegetables Fresh or frozen vegetables. "No salt added" canned vegetables. "No salt added" tomato sauce and paste. Low-sodium or reduced-sodium tomato and vegetable juice. Fruits Fresh, frozen, or canned fruit. Fruit juice. Meats and other protein foods Fresh or frozen (no salt added) meat, poultry, seafood, and fish. Low-sodium canned tuna and salmon. Unsalted nuts. Dried peas, beans, and lentils without added salt. Unsalted canned beans. Eggs. Unsalted nut butters. Dairy Milk. Soy milk. Cheese that is naturally low in sodium, such as ricotta cheese, fresh mozzarella, or Swiss cheese Low-sodium or reduced-sodium cheese. Cream cheese. Yogurt. Fats and oils Unsalted butter. Unsalted margarine with no trans fat. Vegetable oils such as canola or olive oils. Seasonings and other foods Fresh and dried herbs and spices. Salt-free seasonings. Low-sodium mustard and ketchup. Sodium-free salad dressing. Sodium-free light mayonnaise. Fresh or refrigerated horseradish. Lemon juice. Vinegar. Homemade, reduced-sodium, or low-sodium soups. Unsalted popcorn and pretzels. Low-salt or salt-free chips. What foods are not recommended? The items listed may not be a complete list. Talk with your dietitian about what dietary choices are best for you. Grains Instant hot cereals. Bread stuffing, pancake, and  biscuit mixes. Croutons. Seasoned rice or pasta mixes. Noodle soup cups. Boxed or frozen macaroni and cheese. Regular salted crackers.  Self-rising flour. Vegetables Sauerkraut, pickled vegetables, and relishes. Olives. [place] fries. Onion rings. Regular canned vegetables (not low-sodium or reduced-sodium). Regular canned tomato sauce and paste (not low-sodium or reduced-sodium). Regular tomato and vegetable juice (not low-sodium or reduced-sodium). Frozen vegetables in sauces. Meats and other protein foods Meat or fish that is salted, canned, smoked, spiced, or pickled. Bacon, ham, sausage, hotdogs, corned beef, chipped beef, packaged lunch meats, salt pork, jerky, pickled herring, anchovies, regular canned tuna, sardines, salted nuts. Dairy Processed cheese and cheese spreads. Cheese curds. Blue cheese. Feta cheese. String cheese. Regular cottage cheese. Buttermilk. Canned milk. Fats and oils Salted butter. Regular margarine. Ghee. Bacon fat. Seasonings and other foods Onion salt, garlic salt, seasoned salt, table salt, and sea salt. Canned and packaged gravies. Worcestershire sauce. Tartar sauce. Barbecue sauce. Teriyaki sauce. Soy sauce, including reduced-sodium. Steak sauce. Fish sauce. Oyster sauce. Cocktail sauce. Horseradish that you find on the shelf. Regular ketchup and mustard. Meat flavorings and tenderizers. Bouillon cubes. Hot sauce and Tabasco sauce. Premade or packaged marinades. Premade or packaged taco seasonings. Relishes. Regular salad dressings. Salsa. Potato and tortilla chips. Corn chips and puffs. Salted popcorn and pretzels. Canned or dried soups. Pizza. Frozen entrees and pot pies. Summary  Eating less sodium can help lower your blood pressure, reduce swelling, and protect your heart, liver, and kidneys.  Most people on this plan should limit their sodium intake to 1,500-2,000 mg (milligrams) of sodium each day.  Canned, boxed, and frozen foods are high in sodium. Restaurant foods, fast foods, and pizza are also very high in sodium. You also get sodium by adding salt to food.  Try to cook at home, eat  more fresh fruits and vegetables, and eat less fast food, canned, processed, or prepared foods. This information is not intended to replace advice given to you by your health care provider. Make sure you discuss any questions you have with your health care provider. Document Released: 08/30/2001 Document Revised: 03/03/2016 Document Reviewed: 03/03/2016 Elsevier Interactive Patient Education  [employer].

## 2017-03-23 ENCOUNTER — Ambulatory Visit: Payer: Managed Care, Other (non HMO) | Admitting: Gastroenterology

## 2017-03-23 ENCOUNTER — Encounter: Payer: Self-pay | Admitting: Gastroenterology

## 2017-03-23 ENCOUNTER — Encounter (INDEPENDENT_AMBULATORY_CARE_PROVIDER_SITE_OTHER): Payer: Self-pay

## 2017-03-23 ENCOUNTER — Other Ambulatory Visit (INDEPENDENT_AMBULATORY_CARE_PROVIDER_SITE_OTHER): Payer: Managed Care, Other (non HMO)

## 2017-03-23 VITALS — BP 162/108 | HR 60 | Ht 66.75 in | Wt 359.0 lb

## 2017-03-23 DIAGNOSIS — Z7901 Long term (current) use of anticoagulants: Secondary | ICD-10-CM | POA: Diagnosis not present

## 2017-03-23 DIAGNOSIS — Z1211 Encounter for screening for malignant neoplasm of colon: Secondary | ICD-10-CM | POA: Diagnosis not present

## 2017-03-23 DIAGNOSIS — D649 Anemia, unspecified: Secondary | ICD-10-CM

## 2017-03-23 DIAGNOSIS — I509 Heart failure, unspecified: Secondary | ICD-10-CM | POA: Diagnosis not present

## 2017-03-23 LAB — VITAMIN B12: Vitamin B-12: 306 pg/mL (ref 211–911)

## 2017-03-23 LAB — TSH: TSH: 1.77 u[IU]/mL (ref 0.35–4.50)

## 2017-03-23 LAB — FERRITIN: Ferritin: 25.1 ng/mL (ref 22.0–322.0)

## 2017-03-23 LAB — FOLATE: Folate: 11.7 ng/mL (ref >5.9)

## 2017-03-23 NOTE — Patient Instructions (Addendum)
If you are age 54 or older, your body mass index should be between 23-30. Your Body mass index is 56.65 kg/m. If this is out of the aforementioned range listed, please consider follow up with your Primary Care Provider.  If you are age 59 or younger, your body mass index should be between 19-25. Your Body mass index is 56.65 kg/m. If this is out of the aformentioned range listed, please consider follow up with your Primary Care Provider.   Please go to the lab in the basement of our building to have lab work done as you leave today.  Please call us once you have had your appointment with Dr. Haroldine Laws and have been cleared to have a colonoscopy. Our March hospital schedule will become available in late January and you can call us at [phone] to schedule.   Thank you for entrusting me with your care and for [facility], Dr. [place] Cellar

## 2017-03-23 NOTE — Progress Notes (Signed)
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 [name] for a new patient consult to discuss colon cancer screening / anemia in light of comorbidities.   Hgb 11.7, MCV 87 - baseline Hgb 11-12s FOBT positive in 2009 Baseline GFR around 30s   Admitted 2 weeks ago to the hospital for CHF exacerbation, although his last echo in September was on 9/30 with an EF of 55%. He states he initially presented with worsening dyspnea and LE edema. He reports he feels improved since his hospitalization, lower extremity edema has resolved and his breathing is improved, although still not back to baseline. He still has exertional dyspnea with stairs, he denies chest pains. He has been referred to Dr. Zoila Shutter / heart failure clinic, due to see them in the next 2 weeks. He uses supplemental oxygen occasionally with sleeping and wears CPAP for OSA  He is never had a prior colonoscopy. He does not see any blood in the stools. He has occasional constipation for which he uses MiraLAX as needed, he states this worked really well for him. He denies any abdominal pains. No reflux or dysphagia. Has a history of PE/DVT for which he is on Xarelto, he has been on this regimen for the past year. In his chart he had positive fecal occult blood testing in 2009, but has never had any follow-up colonoscopy. He denies any family history of colon cancer. He has a mild anemia with baseline hemoglobin 11-12s, MCV in 80s.   Echo 12/21/2016 - EF 55-60%  Past Medical History:  Diagnosis Date  . CHF (congestive heart failure) ([place]) 11/15   EF 40-45%, improved with follow-up  . COPD (chronic obstructive pulmonary disease) ([place])   . DVT (deep vein thrombosis) in pregnancy ([place])   . Gout   . Hypertension   . Hypertensive cardiovascular disease   . Kidney dysfunction   . Morbid obesity ([place])   . OSA on CPAP   . Paroxysmal atrial fibrillation  ([facility])   . PNA (pneumonia)   . Pulmonary embolism ([place]) 2010   left leg DVT     Past Surgical History:  Procedure Laterality Date  . UMBILICAL HERNIA REPAIR  1992   Family History  Problem Relation Age of Onset  . Hypertension Mother   . Diabetes Father   . CAD Father   . Stomach cancer Father   . Clotting disorder Father   . Heart disease Father        transplant  . Allergies Brother    Social History   Tobacco Use  . Smoking status: Former Smoker    Packs/day: 0.25    Years: 1.00    Pack years: 0.25    Types: Cigarettes    Last attempt to quit: 07/23/1989    Years since quitting: 27.6  . Smokeless tobacco: Never Used  Substance Use Topics  . Alcohol use: No    Alcohol/week: 0.0 oz  . Drug use: No   Current Outpatient Medications  Medication Sig Dispense Refill  . albuterol (PROVENTIL HFA;VENTOLIN HFA) 108 (90 Base) MCG/ACT inhaler Inhale 2 puffs into the lungs every 4 (four) hours as needed for wheezing or shortness of breath. 1 Inhaler 2  . albuterol (PROVENTIL) (2.5 MG/3ML) 0.083% nebulizer solution Take 3 mLs (2.5 mg total) by nebulization every 6 (six) hours as needed for wheezing or shortness of breath. 360 mL 11  .  allopurinol (ZYLOPRIM) 300 MG tablet Take 1 tablet (300 mg total) daily by mouth. 90 tablet 3  . budesonide-formoterol (SYMBICORT) 80-4.5 MCG/ACT inhaler Inhale 2 puffs 2 (two) times daily into the lungs. 3 Inhaler 3  . carvedilol (COREG) 3.125 MG tablet TAKE 1 TABLET (3.125 MG TOTAL) BY MOUTH 2 (TWO) TIMES DAILY WITH A MEAL. 180 tablet 1  . cloNIDine (CATAPRES) 0.1 MG tablet Take 1 tablet (0.1 mg total) by mouth 3 (three) times daily. 90 tablet 0  . colchicine 0.6 MG tablet Take 1 tablet (0.6 mg total) by mouth daily. (Patient taking differently: Take 0.6 mg by mouth daily as needed (gout). ) 30 tablet 0  . hydrALAZINE (APRESOLINE) 50 MG tablet Take 50 mg by mouth 2 (two) times daily.    . montelukast (SINGULAIR) 10 MG tablet Take 1 tablet (10 mg  total) by mouth at bedtime. 30 tablet 11  . OXYGEN Inhale 2 L into the lungs daily. 2lpm when needed per pt     . oxymetazoline (AFRIN NASAL SPRAY) 0.05 % nasal spray Place 1 spray into both nostrils 2 (two) times daily as needed for congestion.    . potassium chloride SA (K-DUR,KLOR-CON) 20 MEQ tablet Take 1.5 tablets (30 mEq total) by mouth daily. (Patient taking differently: Take 20 mEq by mouth daily. ) 90 tablet 3  . torsemide (DEMADEX) 20 MG tablet Take 3 tablets (60 mg total) by mouth 2 (two) times daily. 540 tablet 3  . UNABLE TO FIND Med Name: CPAP with sleep    . XARELTO 20 MG TABS tablet TAKE 1 TABLET BY MOUTH DAILY WITH SUPPER 30 tablet 5   No current facility-administered medications for this visit.    No Known Allergies   Review of Systems: All systems reviewed and negative except where noted in HPI.    Dg Chest 2 View  Result Date: 03/10/2017 CLINICAL DATA:  Shortness of breath. EXAM: CHEST  2 VIEW COMPARISON:  12/20/2016 FINDINGS: Cardiomegaly is similar. Slight increased vascular congestion from prior exam. No pleural effusion. No confluent airspace disease or pneumothorax. No acute osseous abnormalities. IMPRESSION: Similar cardiomegaly. Increased vascular congestion. Findings suggest fluid overload/CHF. Electronically Signed   By: Jeb Levering M.D.   On: 03/10/2017 04:02   Lab Results  Component Value Date   WBC 4.9 03/10/2017   HGB 11.7 (L) 03/10/2017   HCT 37.1 (L) 03/10/2017   MCV 87.1 03/10/2017   PLT 196 03/10/2017    Lab Results  Component Value Date   CREATININE 2.02 (H) 03/12/2017   BUN 33 (H) 03/12/2017   NA 143 03/12/2017   K 3.3 (L) 03/12/2017   CL 106 03/12/2017   CO2 31 03/12/2017    Lab Results  Component Value Date   ALT 17 06/02/2016   AST 22 06/02/2016   ALKPHOS 61 06/02/2016   BILITOT 0.7 06/02/2016      Physical Exam: BP (!) 162/108 (BP Location: Left Wrist, Patient Position: Sitting, Cuff Size: Normal)   Pulse 60   Ht 5'  6.75" (1.695 m) Comment: height measured without shoes  Wt (!) 359 lb (162.8 kg)   BMI 56.65 kg/m  Constitutional: Pleasant, male in no acute distress. HEENT: Normocephalic and atraumatic. Conjunctivae are normal. No scleral icterus. Neck supple.  Cardiovascular: Normal rate, regular rhythm.  Pulmonary/chest: Effort normal and breath sounds normal bilaterally Abdominal: Soft, obese / protuberant abdomen, nontender.There are no masses palpable.  Extremities: trace edema LE bilaterally Lymphadenopathy: No cervical adenopathy noted. Neurological: Alert and oriented  to person place and time. Skin: Skin is warm and dry. No rashes noted. Psychiatric: Normal mood and affect. Behavior is normal.   ASSESSMENT AND PLAN: 56 year old male with numerous comorbidities to include morbid obesity, oxygen dependence, history of PE/DVT on Xarelto, and history of heart failure with recent admission for CHF exacerbation, presenting to discuss colon cancer screening. Is incidentally noted to have a normocytic anemia.  Anemia / colon cancer screening - he has no significant GI symptoms nor family history, he is at average risk for colon cancer but overdue for screening. He does have a normocytic anemia, possibly due to CKD, and had a positive fecal occult blood test in 2009 for which he never had a follow-up evaluation. I'm recommending lab workup for his anemia to include iron studies, B12 12 folate, thyroid. If he has an iron deficiency he will warrant upper endoscopy and colonoscopy to further evaluate. If he does not have any iron deficiency, we discussed options for colon cancer screening. With his positive fecal occult blood test remotely, optical colonoscopy is recommended. He is higher than average risk for anesthesia given his weight and comorbidities as outlined. He is getting another opinion on this heart failure with Dr. Zoila Shutter in the next 2 weeks, it would be preferable to await this evaluation first  and ensure his heart failure is well controlled prior to putting him through a colonoscopy. He agreed with this plan. I will await his anemia workup and I asked the patient to call me back after his visit with Dr. Zoila Shutter, and we can discuss the timing of a possible colonoscopy. He will need approval told Xarelto for a few days prior to colonoscopy, after he is cleared for a procedure. He agreed with the plan.  Galena Cellar, MD [facility] Pager [phone]  CC: [name], MD

## 2017-03-24 LAB — IRON AND TIBC
Iron Saturation: 22 % (ref 15–55)
Iron: 74 ug/dL (ref 38–169)
Total Iron Binding Capacity: 334 ug/dL (ref 250–450)
UIBC: 260 ug/dL (ref 111–343)

## 2017-03-27 IMAGING — CR DG CHEST 2V
2 series · 2 of 2 positions shown · non-contrast
Comparison: 02/20/2015

CLINICAL DATA: Left-sided chest pain beginning this afternoon.
Worsening shortness of breath and fatigue for 2 days. Chronic kidney
disease and congestive heart failure.

EXAM:
CHEST  2 VIEW

[w chest pa]
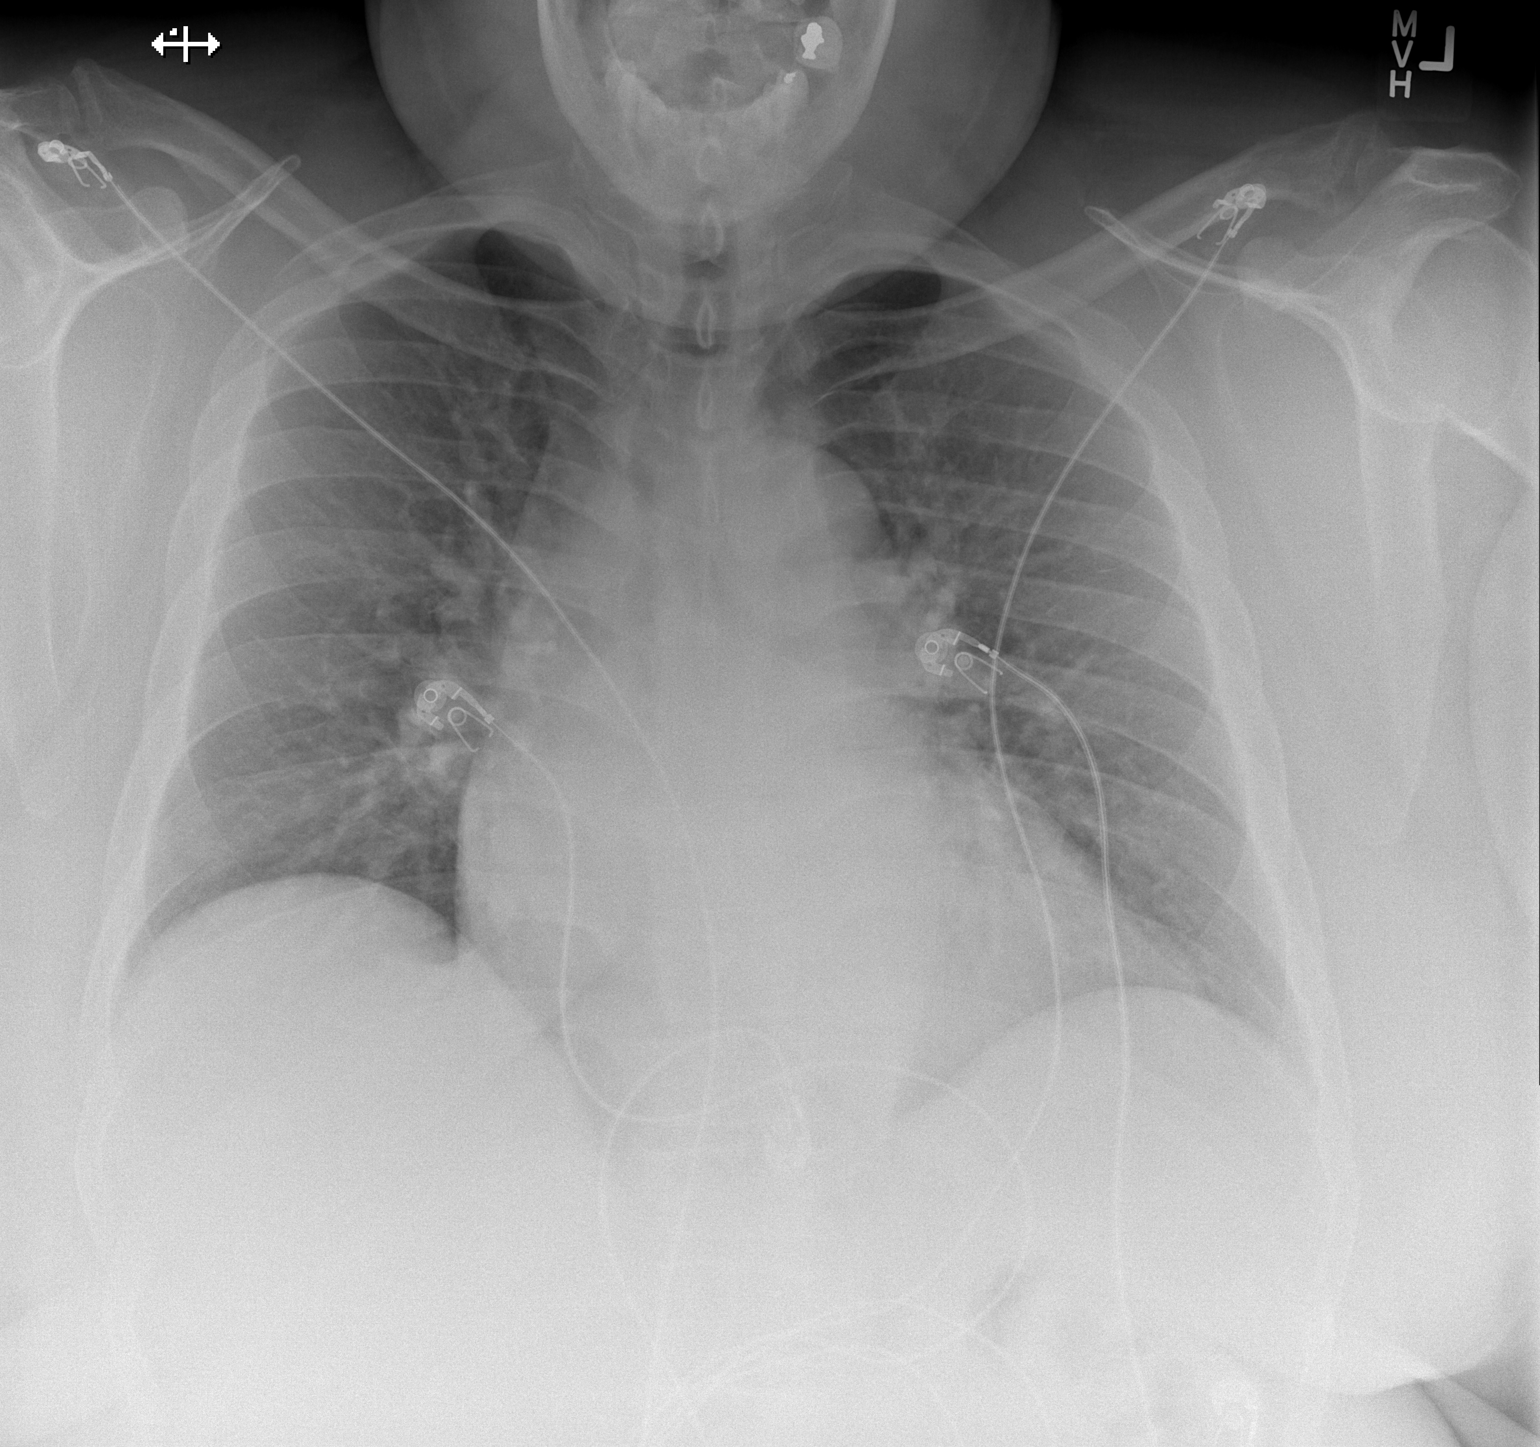

[w chest lat]
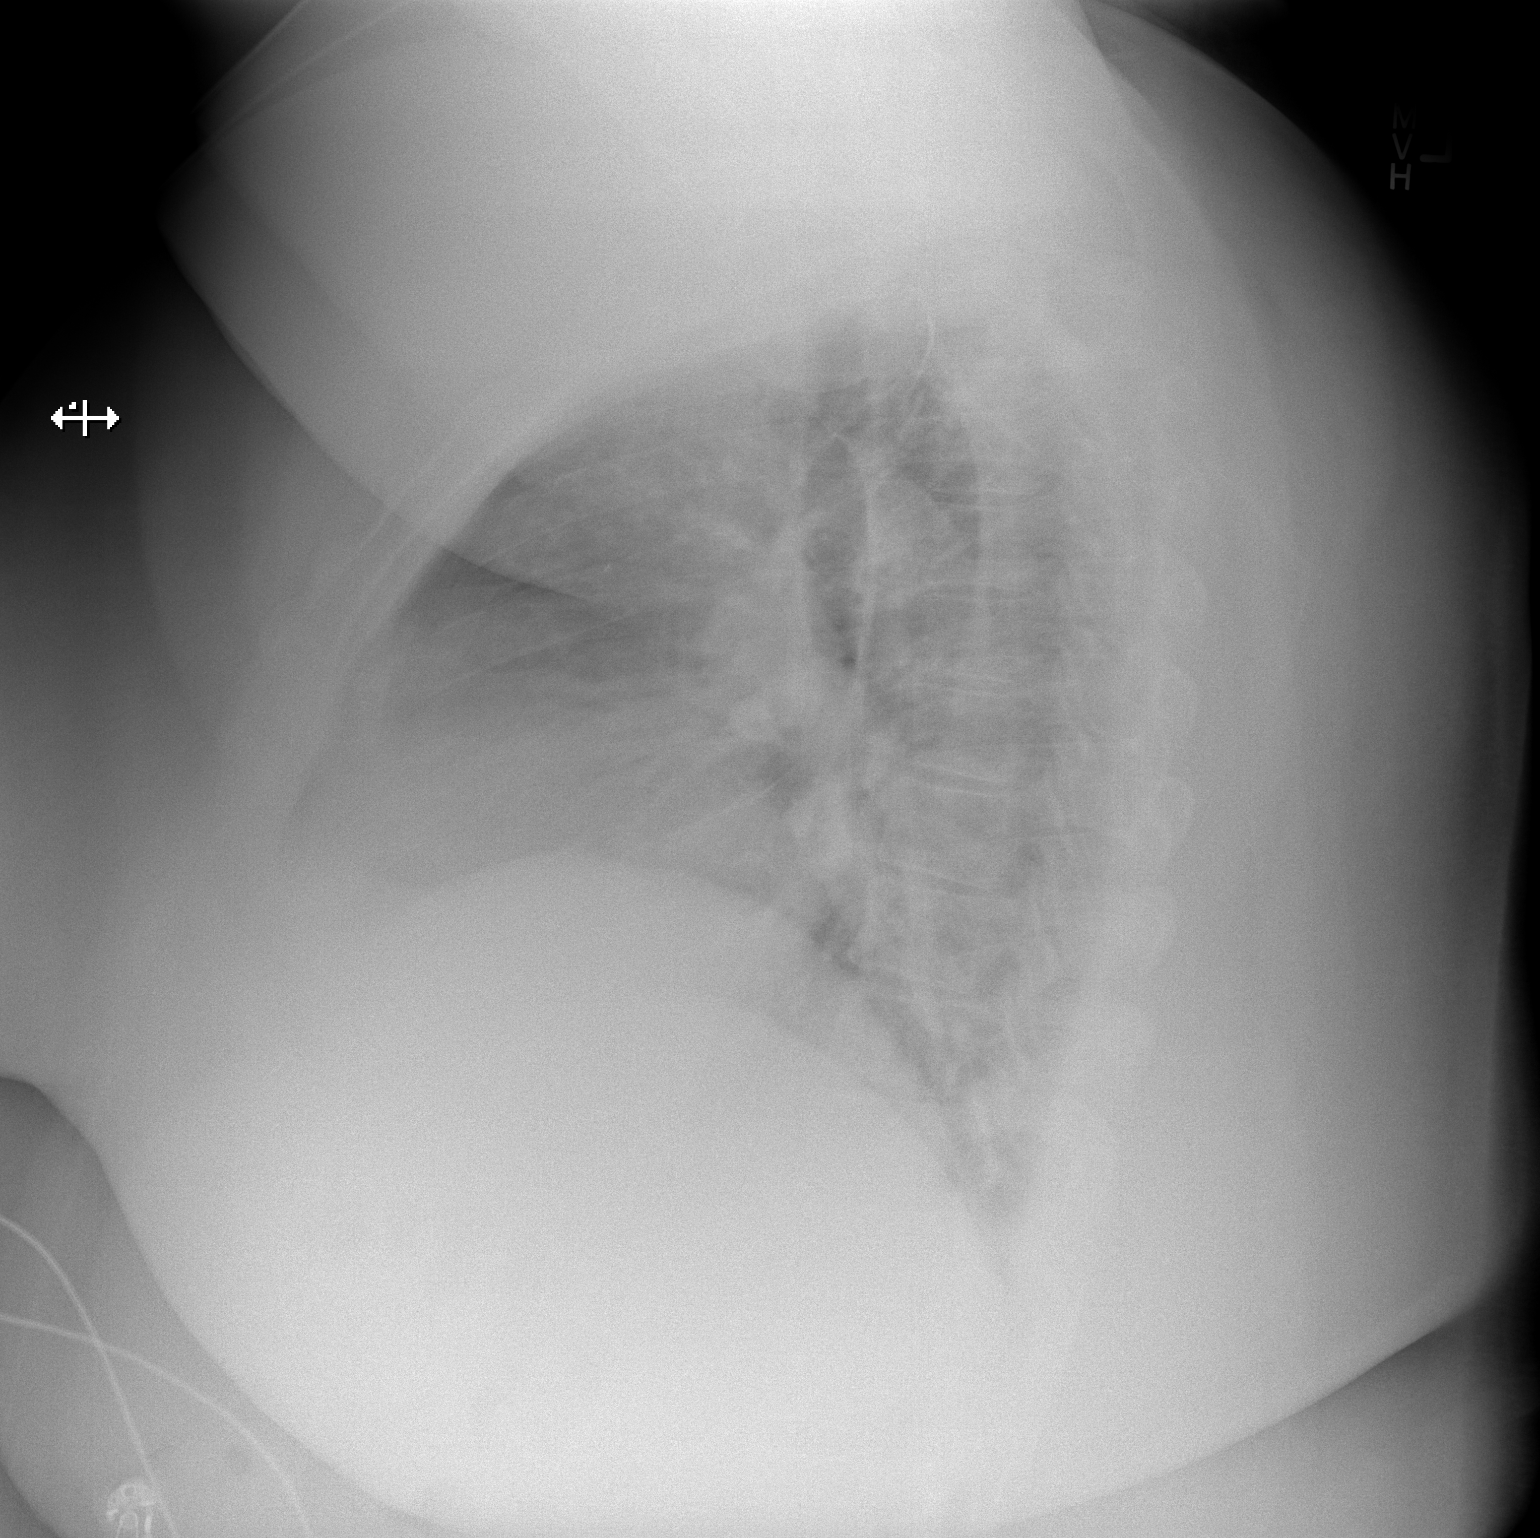

[2 of 2 positions shown; findings below may reference images not displayed]

FINDINGS: Mild cardiomegaly stable. Both lungs are clear. No evidence of
pneumothorax or pleural effusion.
IMPRESSION: Mild cardiomegaly.  No active lung disease.

## 2017-03-30 ENCOUNTER — Encounter (HOSPITAL_COMMUNITY): Payer: Self-pay | Admitting: Internal Medicine

## 2017-03-30 ENCOUNTER — Ambulatory Visit (HOSPITAL_COMMUNITY)
Admission: RE | Admit: 2017-03-30 | Discharge: 2017-03-30 | Disposition: A | Discharge status: Home / Self Care | Payer: Managed Care, Other (non HMO) | Source: Ambulatory Visit | Attending: Internal Medicine | Admitting: Internal Medicine

## 2017-03-30 ENCOUNTER — Other Ambulatory Visit: Payer: Self-pay

## 2017-03-30 VITALS — BP 146/76 | HR 88 | Wt 363.2 lb

## 2017-03-30 DIAGNOSIS — I5032 Chronic diastolic (congestive) heart failure: Secondary | ICD-10-CM | POA: Diagnosis present

## 2017-03-30 DIAGNOSIS — M109 Gout, unspecified: Secondary | ICD-10-CM | POA: Insufficient documentation

## 2017-03-30 DIAGNOSIS — G4733 Obstructive sleep apnea (adult) (pediatric): Secondary | ICD-10-CM | POA: Insufficient documentation

## 2017-03-30 DIAGNOSIS — I10 Essential (primary) hypertension: Secondary | ICD-10-CM

## 2017-03-30 DIAGNOSIS — Z79899 Other long term (current) drug therapy: Secondary | ICD-10-CM | POA: Insufficient documentation

## 2017-03-30 DIAGNOSIS — Z87891 Personal history of nicotine dependence: Secondary | ICD-10-CM | POA: Diagnosis not present

## 2017-03-30 DIAGNOSIS — I110 Hypertensive heart disease with heart failure: Secondary | ICD-10-CM | POA: Insufficient documentation

## 2017-03-30 DIAGNOSIS — I480 Paroxysmal atrial fibrillation: Secondary | ICD-10-CM | POA: Insufficient documentation

## 2017-03-30 DIAGNOSIS — J449 Chronic obstructive pulmonary disease, unspecified: Secondary | ICD-10-CM | POA: Insufficient documentation

## 2017-03-30 DIAGNOSIS — Z86711 Personal history of pulmonary embolism: Secondary | ICD-10-CM | POA: Diagnosis not present

## 2017-03-30 DIAGNOSIS — Z86718 Personal history of other venous thrombosis and embolism: Secondary | ICD-10-CM | POA: Diagnosis not present

## 2017-03-30 DIAGNOSIS — Z7901 Long term (current) use of anticoagulants: Secondary | ICD-10-CM | POA: Diagnosis not present

## 2017-03-30 DIAGNOSIS — I5042 Chronic combined systolic (congestive) and diastolic (congestive) heart failure: Secondary | ICD-10-CM | POA: Insufficient documentation

## 2017-03-30 DIAGNOSIS — Z8249 Family history of ischemic heart disease and other diseases of the circulatory system: Secondary | ICD-10-CM | POA: Insufficient documentation

## 2017-03-30 MED ORDER — SPIRONOLACTONE 25 MG PO TABS: 25.0000 mg | ORAL_TABLET | Freq: Every day | ORAL | 3 refills | Status: DC

## 2017-03-30 MED ORDER — HYDRALAZINE HCL 50 MG PO TABS: 50.0000 mg | ORAL_TABLET | Freq: Two times a day (BID) | ORAL | 3 refills | Status: DC

## 2017-03-30 NOTE — Progress Notes (Signed)
ADVANCED HF CLINIC CONSULT NOTE  Referring Physician: [name] Primary Care: Silvio Pate Primary Cardiologist: [name]   HPI:  Phillip [name] is a 57 y.o. male hx of PAF, remote PE, hx of DVT, OSA, morbid obesity and HTN also with combined chronic systolic and diastolic HF. With EF 40-45%. He has been wearing CPAP. With CHA2DS2VASc score 2 and pt on xarelto.  Last echo 12/21/16 with EF 55-60% G2DD, mild aortic regurgitation. Last nuc 2008 no ischemia, no hx of cath. Referred by Dr. [name] for assistance with HF management.    He has had multiple admissions to the hospital for volume overload/acute on chronic diastolic heart failure. Last admit in 12/18 d/c weight was 368. Lowest weight in last few month recorded 353.   Works as a dump Administrator. Has been on torsemide 60 bid. Weighs himself about every other day. Weight 355 at home. (Previously was 429) Working on weight loss by cutting back on calories. Baking his food. Uses Mrs. Dash. Trying to limit fluids. Typically drinks a lot of fluid. Says he likes to sip water all the time. No significant CP. Can tell when fluid is coming more SOB and leg swelling. Has BP cuff at home but doesn't use it. Has been out of hydralazine for 3 weeks.    Review of Systems: [y] = yes, [ ]  = no   General: Weight gain [ ] ; Weight loss Blue.Reese ]; Anorexia [ ] ; Fatigue [ ] ; Fever [ ] ; Chills [ ] ; Weakness [ ]   Cardiac: Chest pain/pressure [ ] ; Resting SOB [ ] ; Exertional SOB Blue.Reese ]; Orthopnea [ ] ; Pedal Edema [ y]; Palpitations [ ] ; Syncope [ ] ; Presyncope [ ] ; Paroxysmal nocturnal dyspnea[ ]   Pulmonary: Cough [ ] ; Wheezing[ ] ; Hemoptysis[ ] ; Sputum [ ] ; Snoring [ ]   GI: Vomiting[ ] ; Dysphagia[ ] ; Melena[ ] ; Hematochezia [ ] ; Heartburn[ ] ; Abdominal pain [ ] ; Constipation [ ] ; Diarrhea [ ] ; BRBPR [ ]   GU: Hematuria[ ] ; Dysuria [ ] ; Nocturia[ ]   Vascular: Pain in legs with walking [ ] ; Pain in feet with lying flat [ ] ; Non-healing sores [ ] ; Stroke [ ] ; TIA [ ] ; Slurred  speech [ ] ;  Neuro: Headaches[ ] ; Vertigo[ ] ; Seizures[ ] ; Paresthesias[ ] ;Blurred vision [ ] ; Diplopia [ ] ; Vision changes [ ]   Ortho/Skin: Arthritis Blue.Reese ]; Joint pain [ y]; Muscle pain [ ] ; Joint swelling [ ] ; Back Pain [ ] ; Rash [ ]   Psych: Depression[ ] ; Anxiety[ ]   Heme: Bleeding problems [ ] ; Clotting disorders [ ] ; Anemia [ ]   Endocrine: Diabetes [ ] ; Thyroid dysfunction[ ]    Past Medical History:  Diagnosis Date  . CHF (congestive heart failure) ([place]) 11/15   EF 40-45%, improved with follow-up  . COPD (chronic obstructive pulmonary disease) ([place])   . DVT (deep vein thrombosis) in pregnancy ([place])   . Gout   . Hypertension   . Hypertensive cardiovascular disease   . Kidney dysfunction   . Morbid obesity ([place])   . OSA on CPAP   . Paroxysmal atrial fibrillation ([facility])   . PNA (pneumonia)   . Pulmonary embolism ([place]) 2010   left leg DVT    Current Outpatient Medications  Medication Sig Dispense Refill  . albuterol (PROVENTIL HFA;VENTOLIN HFA) 108 (90 Base) MCG/ACT inhaler Inhale 2 puffs into the lungs every 4 (four) hours as needed for wheezing or shortness of breath. 1 Inhaler 2  . albuterol (PROVENTIL) (2.5 MG/3ML) 0.083% nebulizer solution Take 3 mLs (2.5 mg total)  by nebulization every 6 (six) hours as needed for wheezing or shortness of breath. 360 mL 11  . allopurinol (ZYLOPRIM) 300 MG tablet Take 1 tablet (300 mg total) daily by mouth. 90 tablet 3  . budesonide-formoterol (SYMBICORT) 80-4.5 MCG/ACT inhaler Inhale 2 puffs 2 (two) times daily into the lungs. 3 Inhaler 3  . carvedilol (COREG) 3.125 MG tablet TAKE 1 TABLET (3.125 MG TOTAL) BY MOUTH 2 (TWO) TIMES DAILY WITH A MEAL. 180 tablet 1  . cloNIDine (CATAPRES) 0.1 MG tablet Take 1 tablet (0.1 mg total) by mouth 3 (three) times daily. 90 tablet 0  . colchicine 0.6 MG tablet Take 1 tablet (0.6 mg total) by mouth daily. (Patient taking differently: Take 0.6 mg by mouth daily as needed (gout). ) 30 tablet 0  . hydrALAZINE  (APRESOLINE) 50 MG tablet Take 50 mg by mouth 2 (two) times daily.    . montelukast (SINGULAIR) 10 MG tablet Take 1 tablet (10 mg total) by mouth at bedtime. 30 tablet 11  . OXYGEN Inhale 2 L into the lungs daily. 2lpm when needed per pt     . oxymetazoline (AFRIN NASAL SPRAY) 0.05 % nasal spray Place 1 spray into both nostrils 2 (two) times daily as needed for congestion.    . potassium chloride SA (K-DUR,KLOR-CON) 20 MEQ tablet Take 1.5 tablets (30 mEq total) by mouth daily. 90 tablet 3  . torsemide (DEMADEX) 20 MG tablet Take 3 tablets (60 mg total) by mouth 2 (two) times daily. 540 tablet 3  . UNABLE TO FIND Med Name: CPAP with sleep    . XARELTO 20 MG TABS tablet TAKE 1 TABLET BY MOUTH DAILY WITH SUPPER 30 tablet 5   No current facility-administered medications for this encounter.     No Known Allergies    Social History   Socioeconomic History  . Marital status: Divorced    Spouse name: Not on file  . Number of children: 2  . Years of education: Not on file  . Highest education level: Not on file  Social Needs  . Financial resource strain: Not on file  . Food insecurity - worry: Not on file  . Food insecurity - inability: Not on file  . Transportation needs - medical: Not on file  . Transportation needs - non-medical: Not on file  Occupational History  . 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 Occupation: Airline pilot    Comment: Disabled  Tobacco Use  . Smoking status: Former Smoker    Packs/day: 0.25    Years: 1.00    Pack years: 0.25    Types: Cigarettes    Last attempt to quit: 07/23/1989    Years since quitting: 27.7  . Smokeless tobacco: Never Used  Substance and Sexual Activity  . Alcohol use: No    Alcohol/week: 0.0 oz  . Drug use: No  . Sexual activity: Not Currently  Other Topics Concern  . Not on file  Social History Narrative   Pt lives in [place], alone.   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   Has 2 sons in [place]        No living will   Would want sons to make decisions for him   Would accept resuscitation      Family History  Problem Relation Age of Onset  . Hypertension Mother   . Diabetes Father   . CAD Father   . Stomach cancer Father   . Clotting disorder Father   .  Heart disease Father        transplant  . Allergies Brother     Vitals:   03/30/17 1146  BP: (!) 146/76  Pulse: 88  SpO2: 99%  Weight: (!) 363 lb 4 oz (164.8 kg)    PHYSICAL EXAM: General:  Morbidly obese but well appearing. No respiratory difficulty HEENT: normal Neck: supple. No obvious JVD (hard to assess). Carotids 2+ bilat; no bruits. No lymphadenopathy or thryomegaly appreciated. Cor: PMI nondisplaced. Regular rate & rhythm. No rubs, gallops or murmurs. Lungs: clear Abdomen: obese soft, nontender, nondistended. Unable to assess hepatosplenomegaly. No bruits or masses. Good bowel sounds. Extremities: no cyanosis, clubbing, rash, edema Neuro: alert & oriented x 3, cranial nerves grossly intact. moves all 4 extremities w/o difficulty. Affect pleasant.   ASSESSMENT & PLAN: 1. Chronic diastolic HF - volume status actually looks quite good today.  - continue current regimen - We had long talk about need for better self monitoring of BPs and daily weights. Reviewed use of sliding scale diuretics.If weight up 3 pounds with increase torsemide to 80 bid - add spiro 25 mg daily. BMET 1 week and 1 month  - will follow in HF Clinic. Hopefully we can help prevent further hospitalizations   2. Hypertension - BP up a bit - resume hydralazine 50 bid. Add spiro 25 mg daily.  - check BP daily and record (calendar provided)  3. Morbid obesity - Continue to work on weight loss. Interested in bariatric Surgery down the road   Glori Bickers, MD  3:06 PM

## 2017-03-30 NOTE — Discharge Summary (Signed)
Physician Discharge Summary  Phillip [name] ZYS:[id number] DOB: March 04, 1961 DOA: 03/10/2017  PCP: [name], MD  Admit date: 03/10/2017 Discharge date: 03/12/2017  Time spent: 35  minutes  Recommendations for Outpatient Follow-up:  1. follow-up PCP in two weeks  Discharge Diagnoses:  Active Problems:   Mild persistent asthma in adult without complication   OSA on CPAP   Chronic kidney disease, stage 3 (HCC)   Essential hypertension   Gout   Morbid obesity due to excess calories (HCC)   Paroxysmal atrial fibrillation (HCC)   Long term (current) use of anticoagulants [Z79.01]   CHF (congestive heart failure) ([place])   Discharge Condition: stable  Diet recommendation: heart healthy diet       Filed Weights   03/10/17 0315 03/10/17 1554 03/12/17 0639  Weight: (!) 170.6 kg (376 lb) (!) 168.2 kg (370 lb 13 oz) (!) 167 kg (368 lb 2.7 oz)    History of present illness:  57 y.o.malewith a history of chronic combined CHF, COPD, morbid obesity, OSA, PAF, h/o DVT, and HTN who presented to the ED for progressive dyspnea that began around Thanksgiving.He traveled to [place] for Thanksgiving, and tells me that ever since he came back he felt a little bit more short winded than normal. Over the last 3-4 days he has been having a sinus congestion/URI, for which she saw his pulmonologist Dr. Nonnie Done was given Augmentin. He has been difficulties with maintaining good fluid status, and saw Dr. [name] his cardiologist in October and at that time his Demadex dose was increased. Patient has had no fever or chills, denies any sore throat, denies any muscle aches or diffuse myalgias. Feels like his sinus pressure is improving. His breathing has gotten a lot worse in the last 2 days and he has been more and more short of breath with small distances decided to come to the ER. He does appreciate that he has gained about 3 pounds in the last few days. He is also noticed more lower  extremity swelling. He reports compliance with his diuretics, and mainly with his diet however ate out on Sunday. Denies any chest pain however complains of abdominal "tightness", which is normal for him when he gets fluid buildup.He denies any lightheadedness or dizziness.    Hospital Course:  Acute and chronic diastolic CHF-patient presented with acute exacerbation of diastolic CHF. Echocardiogram from 2018 showed EF 55 to 60%, patient was started on IV Lasix twice a day. Lasix has been stopped all. Patient is back to his baseline. He will continue with torsemide 60 mg twice a day at home. Along with potassium. Today potassium was 3.3, will replace potassium before discharge.  Hypertension-blood pressure is stable, continue coated, chlorine, idolizing.  Paroxysmal atrial fibrillation-continue Xarelto, correct. Heart rate is controlled.  Stage III CKD-creatinine at baseline around 2.  Asthma-patient was prescribed Augmentin is outpatient. Will continue with Augmentin to complete the course.  Obstructive sleep apnea to continue CPAP QHS  History of DVT-continue anticoagulants and with Xarelto    Procedures:  none  Consultations:  none  Discharge Exam:     Vitals:   03/12/17 0639 03/12/17 0939  BP: (!) 153/87   Pulse: 89   Resp: 18   Temp: 97.9 F (36.6 C)   SpO2: 99% 99%    General: appears in no acute distress Cardiovascular: S1 S2, regular Respiratory: clear to auscultation bilaterally  Discharge Instructions       Discharge Instructions    Diet - low sodium heart  healthy   Complete by:  As directed    Increase activity slowly   Complete by:  As directed      Allergies as of 03/12/2017   No Known Allergies             Medication List     STOP taking these medications   NONFORMULARY OR COMPOUNDED ITEM     TAKE these medications   albuterol (2.5 MG/3ML) 0.083% nebulizer solution Commonly known as:   PROVENTIL Take 3 mLs (2.5 mg total) by nebulization every 6 (six) hours as needed for wheezing or shortness of breath.   albuterol 108 (90 Base) MCG/ACT inhaler Commonly known as:  PROVENTIL HFA;VENTOLIN HFA Inhale 2 puffs into the lungs every 4 (four) hours as needed for wheezing or shortness of breath.   allopurinol 300 MG tablet Commonly known as:  ZYLOPRIM Take 1 tablet (300 mg total) daily by mouth.   amoxicillin-clavulanate 875-125 MG tablet Commonly known as:  AUGMENTIN Take 1 tablet by mouth 2 (two) times daily for 10 days.   budesonide-formoterol 80-4.5 MCG/ACT inhaler Commonly known as:  SYMBICORT Inhale 2 puffs 2 (two) times daily into the lungs.   carvedilol 3.125 MG tablet Commonly known as:  COREG TAKE 1 TABLET (3.125 MG TOTAL) BY MOUTH 2 (TWO) TIMES DAILY WITH A MEAL.   cloNIDine 0.1 MG tablet Commonly known as:  CATAPRES Take 1 tablet (0.1 mg total) by mouth 3 (three) times daily.   colchicine 0.6 MG tablet Take 1 tablet (0.6 mg total) by mouth daily. What changed:    when to take this  reasons to take this   hydrALAZINE 50 MG tablet Commonly known as:  APRESOLINE Take 50 mg by mouth 2 (two) times daily.   montelukast 10 MG tablet Commonly known as:  SINGULAIR Take 1 tablet (10 mg total) by mouth at bedtime.   OXYGEN Inhale 2 L into the lungs daily. 2lpm when needed per pt   oxymetazoline 0.05 % nasal spray Commonly known as:  AFRIN NASAL SPRAY Place 1 spray into both nostrils 2 (two) times daily as needed for congestion.   potassium chloride SA 20 MEQ tablet Commonly known as:  K-DUR,KLOR-CON Take 1.5 tablets (30 mEq total) by mouth daily. What changed:  how much to take   torsemide 20 MG tablet Commonly known as:  DEMADEX Take 3 tablets (60 mg total) by mouth 2 (two) times daily.   UNABLE TO FIND Med Name: CPAP with sleep   XARELTO 20 MG Tabs tablet Generic drug:  rivaroxaban TAKE 1 TABLET BY MOUTH DAILY WITH SUPPER       No Known Allergies    The results of significant diagnostics from this hospitalization (including imaging, microbiology, ancillary and laboratory) are listed below for reference.    Significant Diagnostic Studies:  ImagingResults  Dg Chest 2 View  Result Date: 03/10/2017 CLINICAL DATA:  Shortness of breath. EXAM: CHEST  2 VIEW COMPARISON:  12/20/2016 FINDINGS: Cardiomegaly is similar. Slight increased vascular congestion from prior exam. No pleural effusion. No confluent airspace disease or pneumothorax. No acute osseous abnormalities. IMPRESSION: Similar cardiomegaly. Increased vascular congestion. Findings suggest fluid overload/CHF. Electronically Signed   By: Jeb Levering M.D.   On: 03/10/2017 04:02     Microbiology: No results found for this or any previous visit (from the past 240 hour(s)).   Labs: Basic Metabolic Panel: LastLabs       Recent Labs  Lab 03/10/17 0448 03/11/17 0430 03/12/17 0411  NA 141 143  143  K 3.3* 3.6 3.3*  CL 102 106 106  CO2 30 32 31  GLUCOSE 96 102* 99  BUN 33* 32* 33*  CREATININE 1.98* 1.99* 2.02*  CALCIUM 9.0 8.7* 8.7*     Liver Function Tests: LastLabs  No results for input(s): AST, ALT, ALKPHOS, BILITOT, PROT, ALBUMIN in the last 168 hours.   LastLabs  No results for input(s): LIPASE, AMYLASE in the last 168 hours.   LastLabs  No results for input(s): AMMONIA in the last 168 hours.   CBC: LastLabs     Recent Labs  Lab 03/10/17 0448  WBC 4.9  NEUTROABS 3.0  HGB 11.7*  HCT 37.1*  MCV 87.1  PLT 196     Cardiac Enzymes: LastLabs  No results for input(s): CKTOTAL, CKMB, CKMBINDEX, TROPONINI in the last 168 hours.   BNP: BNP (last 3 results) RecentLabs(withinlast365days)       Recent Labs    12/20/16 0301 12/21/16 0634 03/10/17 0448  BNP 748.0* 1,091.6* 675.0*      ProBNP (last 3 results) RecentLabs(withinlast365days)     Recent Labs    09/30/16 1452  PROBNP  877.0*          Signed:  Oswald Hillock MD.  [facility] 03/12/2017, 11:09 AM            Routing History

## 2017-03-30 NOTE — Patient Instructions (Signed)
Restart Hydralazine 50 mg Twice daily   Start Spironolactone 25 mg daily  Labs in 1 week and in 1 month  Your physician recommends that you weigh, daily, at the same time every day, and in the same amount of clothing. Please record your daily weights on the handout provided and bring it to your next appointment.  IF YOU GAIN 3 LBS OR MORE IN 24 HOURS INCREASE TORSEMIDE TO 80 MG (4 TABS) Twice daily  Do the following things EVERYDAY: 1) Weigh yourself in the morning before breakfast. Write it down and keep it in a log. 2) Take your medicines as prescribed 3) Eat low salt foods-Limit salt (sodium) to 2000 mg per day.  4) Stay as active as you can everyday 5) Limit all fluids for the day to less than 2 liters

## 2017-04-06 ENCOUNTER — Ambulatory Visit (HOSPITAL_COMMUNITY)
Admission: RE | Admit: 2017-04-06 | Discharge: 2017-04-06 | Disposition: A | Discharge status: Home / Self Care | Payer: Managed Care, Other (non HMO) | Source: Ambulatory Visit | Attending: Cardiology | Admitting: Cardiology

## 2017-04-06 DIAGNOSIS — I5032 Chronic diastolic (congestive) heart failure: Secondary | ICD-10-CM | POA: Insufficient documentation

## 2017-04-06 LAB — BASIC METABOLIC PANEL
ANION GAP: 11 (ref 5–15)
BUN: 31 mg/dL — ABNORMAL HIGH (ref 6–20)
CALCIUM: 9.4 mg/dL (ref 8.9–10.3)
CO2: 31 mmol/L (ref 22–32)
Chloride: 100 mmol/L — ABNORMAL LOW (ref 101–111)
Creatinine, Ser: 2.27 mg/dL — ABNORMAL HIGH (ref 0.61–1.24)
GFR calc Af Amer: 36 mL/min — ABNORMAL LOW (ref >60)
GFR, EST NON AFRICAN AMERICAN: 31 mL/min — AB (ref >60)
Glucose, Bld: 92 mg/dL (ref 65–99)
Potassium: 3.6 mmol/L (ref 3.5–5.1)
Sodium: 142 mmol/L (ref 135–145)

## 2017-04-14 ENCOUNTER — Telehealth: Payer: Self-pay | Admitting: Internal Medicine

## 2017-04-14 NOTE — Telephone Encounter (Signed)
ONO on RA from 04/10/17 done by [facility] reviewed by MW Per MW- normal, no need to wear o2 with sleep  Spoke with pt and notified of results per Dr. Melvyn Novas. Pt verbalized understanding and denied any questions.

## 2017-04-21 ENCOUNTER — Ambulatory Visit (HOSPITAL_COMMUNITY)
Admission: RE | Admit: 2017-04-21 | Discharge: 2017-04-21 | Disposition: A | Discharge status: Home / Self Care | Payer: Managed Care, Other (non HMO) | Source: Ambulatory Visit | Attending: Internal Medicine | Admitting: Internal Medicine

## 2017-04-21 DIAGNOSIS — I5032 Chronic diastolic (congestive) heart failure: Secondary | ICD-10-CM | POA: Insufficient documentation

## 2017-04-21 LAB — BASIC METABOLIC PANEL
Anion gap: 11 (ref 5–15)
BUN: 36 mg/dL — ABNORMAL HIGH (ref 6–20)
CHLORIDE: 102 mmol/L (ref 101–111)
CO2: 28 mmol/L (ref 22–32)
Calcium: 9.2 mg/dL (ref 8.9–10.3)
Creatinine, Ser: 2.22 mg/dL — ABNORMAL HIGH (ref 0.61–1.24)
GFR calc Af Amer: 37 mL/min — ABNORMAL LOW (ref >60)
GFR calc non Af Amer: 32 mL/min — ABNORMAL LOW (ref >60)
GLUCOSE: 80 mg/dL (ref 65–99)
POTASSIUM: 3.8 mmol/L (ref 3.5–5.1)
Sodium: 141 mmol/L (ref 135–145)

## 2017-04-27 ENCOUNTER — Other Ambulatory Visit (HOSPITAL_COMMUNITY): Payer: Managed Care, Other (non HMO)

## 2017-05-11 ENCOUNTER — Ambulatory Visit (HOSPITAL_COMMUNITY)
Admission: RE | Admit: 2017-05-11 | Discharge: 2017-05-11 | Disposition: A | Discharge status: Home / Self Care | Payer: Managed Care, Other (non HMO) | Source: Ambulatory Visit | Attending: Internal Medicine | Admitting: Internal Medicine

## 2017-05-11 ENCOUNTER — Encounter (HOSPITAL_COMMUNITY): Payer: Self-pay

## 2017-05-11 VITALS — BP 138/92 | HR 108 | Wt 368.2 lb

## 2017-05-11 DIAGNOSIS — Z87891 Personal history of nicotine dependence: Secondary | ICD-10-CM | POA: Diagnosis not present

## 2017-05-11 DIAGNOSIS — Z6841 Body Mass Index (BMI) 40.0 and over, adult: Secondary | ICD-10-CM | POA: Diagnosis not present

## 2017-05-11 DIAGNOSIS — G4733 Obstructive sleep apnea (adult) (pediatric): Secondary | ICD-10-CM | POA: Diagnosis not present

## 2017-05-11 DIAGNOSIS — Z86711 Personal history of pulmonary embolism: Secondary | ICD-10-CM | POA: Diagnosis not present

## 2017-05-11 DIAGNOSIS — N183 Chronic kidney disease, stage 3 unspecified: Secondary | ICD-10-CM

## 2017-05-11 DIAGNOSIS — I130 Hypertensive heart and chronic kidney disease with heart failure and stage 1 through stage 4 chronic kidney disease, or unspecified chronic kidney disease: Secondary | ICD-10-CM | POA: Insufficient documentation

## 2017-05-11 DIAGNOSIS — Z8249 Family history of ischemic heart disease and other diseases of the circulatory system: Secondary | ICD-10-CM | POA: Diagnosis not present

## 2017-05-11 DIAGNOSIS — M109 Gout, unspecified: Secondary | ICD-10-CM | POA: Insufficient documentation

## 2017-05-11 DIAGNOSIS — I480 Paroxysmal atrial fibrillation: Secondary | ICD-10-CM | POA: Diagnosis present

## 2017-05-11 DIAGNOSIS — J449 Chronic obstructive pulmonary disease, unspecified: Secondary | ICD-10-CM | POA: Diagnosis not present

## 2017-05-11 DIAGNOSIS — Z9989 Dependence on other enabling machines and devices: Secondary | ICD-10-CM

## 2017-05-11 DIAGNOSIS — I351 Nonrheumatic aortic (valve) insufficiency: Secondary | ICD-10-CM | POA: Insufficient documentation

## 2017-05-11 DIAGNOSIS — I10 Essential (primary) hypertension: Secondary | ICD-10-CM

## 2017-05-11 DIAGNOSIS — Z86718 Personal history of other venous thrombosis and embolism: Secondary | ICD-10-CM | POA: Diagnosis not present

## 2017-05-11 DIAGNOSIS — Z7901 Long term (current) use of anticoagulants: Secondary | ICD-10-CM | POA: Insufficient documentation

## 2017-05-11 DIAGNOSIS — I5032 Chronic diastolic (congestive) heart failure: Secondary | ICD-10-CM

## 2017-05-11 DIAGNOSIS — Z79899 Other long term (current) drug therapy: Secondary | ICD-10-CM | POA: Diagnosis not present

## 2017-05-11 DIAGNOSIS — I5042 Chronic combined systolic (congestive) and diastolic (congestive) heart failure: Secondary | ICD-10-CM | POA: Diagnosis present

## 2017-05-11 LAB — BASIC METABOLIC PANEL
ANION GAP: 10 (ref 5–15)
BUN: 37 mg/dL — ABNORMAL HIGH (ref 6–20)
CALCIUM: 8.9 mg/dL (ref 8.9–10.3)
CO2: 28 mmol/L (ref 22–32)
Chloride: 104 mmol/L (ref 101–111)
Creatinine, Ser: 2.56 mg/dL — ABNORMAL HIGH (ref 0.61–1.24)
GFR, EST AFRICAN AMERICAN: 31 mL/min — AB (ref >60)
GFR, EST NON AFRICAN AMERICAN: 27 mL/min — AB (ref >60)
Glucose, Bld: 140 mg/dL — ABNORMAL HIGH (ref 65–99)
Potassium: 3.6 mmol/L (ref 3.5–5.1)
SODIUM: 142 mmol/L (ref 135–145)

## 2017-05-11 MED ORDER — ALBUTEROL SULFATE HFA 108 (90 BASE) MCG/ACT IN AERS: 2.0000 | INHALATION_SPRAY | RESPIRATORY_TRACT | 0 refills | Status: DC | PRN

## 2017-05-11 NOTE — Progress Notes (Signed)
ADVANCED HF CLINIC CONSULT NOTE  Referring Physician: [name] Primary Care: Silvio Pate Primary Cardiologist: [name]   HPI:  Phillip [name] is a 57 y.o. male hx of PAF, remote PE, hx of DVT, OSA, morbid obesity and HTN also with combined chronic systolic and diastolic HF. With EF 40-45%. He has been wearing CPAP. With CHA2DS2VASc score 2 and pt on xarelto.   Echo 12/21/16 with EF 55-60% G2DD, mild aortic regurgitation. Last nuc 2008 no ischemia, no hx of cath. Referred by Dr. [name] for assistance with HF management.   He has had multiple admissions to the hospital for volume overload/acute on chronic diastolic heart failure. Last admit in 12/18 d/c weight was 368. Lowest weight in last few month recorded 353.   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   He presents today for regular follow up. Just got back this morning from 5-6 hr drive from [place], [place] for a funeral. Evelina Bucy his BP medications around 0830 this am. He states he checks his BP at home, and on Friday was 030 systolic before he left for funeral. Trying to limit fluid and salt. Has started working out. Using TM ~15 minutes daily and lifting weights. He has no problems walking around work site or climbing up into his dump truck.  He states he is taking all medications as directed.   Review of systems complete and found to be negative unless listed in HPI.    Past Medical History:  Diagnosis Date  . CHF (congestive heart failure) ([place]) 11/15   EF 40-45%, improved with follow-up  . COPD (chronic obstructive pulmonary disease) ([place])   . DVT (deep vein thrombosis) in pregnancy ([place])   . Gout   . Hypertension   . Hypertensive cardiovascular disease   . Kidney dysfunction   . Morbid obesity ([place])   . OSA on CPAP   . Paroxysmal atrial fibrillation (HCC)   . PNA (pneumonia)   . Pulmonary embolism ([place]) 2010   left leg DVT   Current Outpatient Medications  Medication Sig Dispense Refill  . carvedilol (COREG)  3.125 MG tablet TAKE 1 TABLET (3.125 MG TOTAL) BY MOUTH 2 (TWO) TIMES DAILY WITH A MEAL. 180 tablet 1  . cloNIDine (CATAPRES) 0.1 MG tablet Take 1 tablet (0.1 mg total) by mouth 3 (three) times daily. 90 tablet 0  . hydrALAZINE (APRESOLINE) 50 MG tablet Take 1 tablet (50 mg total) by mouth 2 (two) times daily. 60 tablet 3  . spironolactone (ALDACTONE) 25 MG tablet Take 1 tablet (25 mg total) by mouth daily. 30 tablet 3  . torsemide (DEMADEX) 20 MG tablet Take 3 tablets (60 mg total) by mouth 2 (two) times daily. 540 tablet 3  . albuterol (PROVENTIL HFA;VENTOLIN HFA) 108 (90 Base) MCG/ACT inhaler Inhale 2 puffs into the lungs every 4 (four) hours as needed for wheezing or shortness of breath. 1 Inhaler 2  . albuterol (PROVENTIL) (2.5 MG/3ML) 0.083% nebulizer solution Take 3 mLs (2.5 mg total) by nebulization every 6 (six) hours as needed for wheezing or shortness of breath. 360 mL 11  . allopurinol (ZYLOPRIM) 300 MG tablet Take 1 tablet (300 mg total) daily by mouth. 90 tablet 3  . budesonide-formoterol (SYMBICORT) 80-4.5 MCG/ACT inhaler Inhale 2 puffs 2 (two) times daily into the lungs. 3 Inhaler 3  . colchicine 0.6 MG tablet Take 1 tablet (0.6 mg total) by mouth daily. (Patient taking differently: Take 0.6 mg by mouth daily as needed (gout). ) 30 tablet  0  . montelukast (SINGULAIR) 10 MG tablet Take 1 tablet (10 mg total) by mouth at bedtime. 30 tablet 11  . OXYGEN Inhale 2 L into the lungs daily. 2lpm when needed per pt     . oxymetazoline (AFRIN NASAL SPRAY) 0.05 % nasal spray Place 1 spray into both nostrils 2 (two) times daily as needed for congestion.    . potassium chloride SA (K-DUR,KLOR-CON) 20 MEQ tablet Take 1.5 tablets (30 mEq total) by mouth daily. 90 tablet 3  . UNABLE TO FIND Med Name: CPAP with sleep    . XARELTO 20 MG TABS tablet TAKE 1 TABLET BY MOUTH DAILY WITH SUPPER 30 tablet 5   No current facility-administered medications for this encounter.    No Known Allergies  Social  History   Socioeconomic History  . Marital status: Divorced    Spouse name: Not on file  . Number of children: 2  . Years of education: Not on file  . Highest education level: Not on file  Social Needs  . Financial resource strain: Not on file  . Food insecurity - worry: Not on file  . Food insecurity - inability: Not on file  . Transportation needs - medical: Not on file  . Transportation needs - non-medical: Not on file  Occupational History  . 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 Occupation: Airline pilot    Comment: Disabled  Tobacco Use  . Smoking status: Former Smoker    Packs/day: 0.25    Years: 1.00    Pack years: 0.25    Types: Cigarettes    Last attempt to quit: 07/23/1989    Years since quitting: 27.8  . Smokeless tobacco: Never Used  Substance and Sexual Activity  . Alcohol use: No    Alcohol/week: 0.0 oz  . Drug use: No  . Sexual activity: Not Currently  Other Topics Concern  . Not on file  Social History Narrative   Pt lives in [place], alone.   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   Has 2 sons in [place]      No living will   Would want sons to make decisions for him   Would accept resuscitation    Family History  Problem Relation Age of Onset  . Hypertension Mother   . Diabetes Father   . CAD Father   . Stomach cancer Father   . Clotting disorder Father   . Heart disease Father        transplant  . Allergies Brother    Vitals:   05/11/17 0937  BP: (!) 138/92  Pulse: (!) 108  SpO2: 97%  Weight: (!) 368 lb 3.2 oz (167 kg)   Wt Readings from Last 3 Encounters:  05/11/17 (!) 368 lb 3.2 oz (167 kg)  03/30/17 (!) 363 lb 4 oz (164.8 kg)  03/23/17 (!) 359 lb (162.8 kg)   PHYSICAL EXAM: General: Morbid obesity. NAD.  HEENT: Normal Neck: Supple. JVP difficult to assess. Carotids 2+ bilat; no bruits. No thyromegaly or nodule noted. Cor: PMI nondisplaced. RRR, No M/G/R noted Lungs: CTAB, normal effort. Abdomen:  Soft, non-tender, non-distended, no HSM. No bruits or masses. +BS  Extremities: No cyanosis, clubbing, or rash. R and LLE no edema.  Neuro: Alert & orientedx3, cranial nerves grossly intact. moves all 4 extremities w/o difficulty. Affect pleasant   ASSESSMENT & PLAN:  1. Chronic diastolic HF - Echo 07/4625 LVEF 55-60%, Grade 2 DD.  - Volume status stable.  -  NYHA II-III symptoms.  - Continue torsemide 60 mg BID. Take torsemide 80 mg BID as needed for weight gain 3 lbs overnight.  - Continue spiro 25 mg daily. BMET today.  - Reinforced fluid restriction to < 2 L daily, sodium restriction to less than 2000 mg daily, and the importance of daily weights.    2. Hypertension - Elevated this am, but reportedly stable at home and just got out of car from 5-6 hr drive.  - Continue hydralazine 50 bid.  - Continue coreg 3.125 mg BID - Continue spiro 25 mg daily.  - Follow BP daily.   3. Morbid obesity - Body mass index is 58.1 kg/m.  - Needs weight loss. Interested in bariatric Surgery down the road. Discussed further today. Wants to continue working on his own for now.   4. CKD III-IV - BMET today.   5. OSA on CPAP - Continue nightly CPAP  BMET today. Will leave meds where they are with stable BPs at home and CKD III-IV.   Shirley Friar, PA-C  9:42 AM  Greater than 50% of the 25 minute visit was spent in counseling/coordination of care regarding disease state education, salt/fluid restriction, sliding scale diuretics, and medication compliance.

## 2017-05-11 NOTE — Patient Instructions (Signed)
Routine lab work today. Will notify you of abnormal results, otherwise no news is good news!  Inhaler refilled.  Follow up 6 weeks with Oda Kilts PA-C.  Take all medication as prescribed the day of your appointment. Bring all medications with you to your appointment.  Do the following things EVERYDAY: 1) Weigh yourself in the morning before breakfast. Write it down and keep it in a log. 2) Take your medicines as prescribed 3) Eat low salt foods-Limit salt (sodium) to 2000 mg per day.  4) Stay as active as you can everyday 5) Limit all fluids for the day to less than 2 liters

## 2017-05-14 ENCOUNTER — Encounter (HOSPITAL_COMMUNITY): Payer: Self-pay

## 2017-05-14 ENCOUNTER — Telehealth (HOSPITAL_COMMUNITY): Payer: Self-pay

## 2017-05-14 NOTE — Telephone Encounter (Signed)
Result Notes for Basic metabolic panel   Notes recorded by Effie Berkshire, RN on 05/14/2017 at 2:24 PM EST 3rd failed attempt to reach patient, LVM, letter mailed, pharmacy verifies patient last p/u Rx 04/24/2017 for 30 day supply. ------  Notes recorded by Kerry Dory, CMA on 05/12/2017 at 4:25 PM EST Left message for patient to call back. [phone] ------  Notes recorded by Kerry Dory, CMA on 05/11/2017 at 3:59 PM EST Left message for patient to call back. [phone]   ------  Notes recorded by Shirley Friar, PA-C on 05/11/2017 at 10:53 AM EST  His fluid looked good on exam today.  Could you please confirm how he is taking his torsemide? Have hold for 1 (one) day/2 doses then continue 60 mg BID. Can we also call pharmacy and make sure he is filling meds? Some concern for compliance.    Legrand Como [address]" [place], PA-C 05/11/2017 10:53 AM

## 2017-05-15 NOTE — Telephone Encounter (Signed)
Patient returned call regarding labs. Pt aware of results and voiced understanding,    Spoke with [facility] pharmacy patient last filled meds 04/24/17, and has been compliant with refills x 2

## 2017-06-08 ENCOUNTER — Ambulatory Visit: Payer: Managed Care, Other (non HMO) | Admitting: Internal Medicine

## 2017-06-16 ENCOUNTER — Encounter: Payer: Self-pay | Admitting: Internal Medicine

## 2017-06-16 ENCOUNTER — Ambulatory Visit: Payer: Managed Care, Other (non HMO) | Admitting: Internal Medicine

## 2017-06-16 VITALS — BP 132/80 | HR 100 | Ht 69.0 in | Wt 374.6 lb

## 2017-06-16 DIAGNOSIS — J4530 Mild persistent asthma, uncomplicated: Secondary | ICD-10-CM

## 2017-06-16 NOTE — Progress Notes (Signed)
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 Acute Visit    Pt states every morning he hears a rattling in his chest, still having the sob while walking, still has an occ. cough but mainly non productive, only has chest tightness when he exerts himself, he states he does has some chest conegstion Denies fever, pt unsure if his recent trip to [place] has anything to do with his symtpoms   worse x 3 weeks but better since in ER with dx with chf 09/15/16 and sleeping fine on cpap  Much better p lasix Some better after saba but mostly using neb and skipping hfa saba in action paln  Minimal rattling better p extra dose of diuretic rec Plan A = Automatic = Symbicort 80 Take 2 puffs first thing in am and then another 2 puffs about 12 hours later.  Plan B = Backup Only use your albuterol as a rescue medication to be used if you can't catch your breath Plan C = Crisis - only use your albuterol nebulizer if you first try Plan B           12/16/2016  f/u ov/Davie Claud re: symbicort 80 plus prn sabas/ no 02 at all  Chief Complaint  Patient presents with  . Follow-up    Increased wheezing in the am x 2 months. He has been using his albuterol inhaler 2 x per day on average. He rarely uses his neb. He had some prod cough with yellow sputum 1 wk ago.    Off singulair since 03/2016 overall worse since but sleeps fine on cpap s 02  - s cpap wakes up gagging and  "wheezing" rec Restart singulair 10 mg each pm   Please see patient coordinator before you leave today  to schedule Internal medicine/ [facility] Change appt to Please schedule a follow up visit in 3 months but call sooner if needed      03/09/2017  f/u ov/Juda Toepfer re:  Asthma/ uses cpap but not 02 -still has it but never uses  Chief Complaint  Patient presents with  . Follow-up    Breathing has been worse since he developed a cold Thanksgiving. He is coughing up some brown to blood tinged sputum.  He rarely uses his albuterol inhaler and has not used his neb.   head cold/ congestion bloody nasty mucus x sev weeks and more sob but not using much rescue at all  Increased doe but comfortable at rest and sleeping on cpap rec  Augmentin 875 mg take one pill twice daily  X 10 days  For cough mucinex dm up to 1200 mg every 12 hours as needed  Call if not better and we'll schedule you a sinus CT next and ENT follow up if needed    06/16/2017  f/u ov/Nyellie Yetter re:  Asthma / MO Chief Complaint  Patient presents with  . Follow-up    Breathing is overall doing well. He had some bloody nasal d/c 1 day ago. He rarely uses his albuterol inhaler or neb.    Dyspnea:  More limited by knees  Cough: minimal  Sleep: on cpap no 02 SABA use:  Rarely   No obvious day to day or daytime variability or assoc excess/ purulent sputum or mucus plugs or hemoptysis or cp or chest tightness, subjective wheeze or overt sinus or hb symptoms. No unusual exposure hx or h/o childhood pna/ asthma or knowledge of premature birth.  Sleeping ok on  cpap  without nocturnal  or early am exacerbation  of respiratory  c/o's or need for noct saba. Also denies any obvious fluctuation of symptoms with weather or environmental changes or other aggravating or alleviating factors except as outlined above   Current Allergies, Complete Past Medical History, Past Surgical History, Family History, and Social History were reviewed in  [employer] record.  ROS  The following are not active complaints unless bolded Hoarseness, sore throat, dysphagia, dental problems, itching, sneezing,  nasal congestion or discharge of bloody mucus or purulent secretions, ear ache,   fever, chills, sweats, unintended wt loss or wt gain, classically pleuritic or exertional cp,  orthopnea pnd or leg swelling, presyncope, palpitations, abdominal pain, anorexia, nausea, vomiting, diarrhea  or change in bowel habits or change in bladder habits, change in stools or change in urine, dysuria, hematuria,  rash, arthralgias, visual complaints, headache, numbness, weakness or ataxia or problems with walking or coordination,  change in mood/affect or memory.          Meds: not able to confirm any but supposed to be on symb 80 2bid / singulair       Objective:   Physical Exam    Obese bm nad    06/16/2017          374  03/09/2017      376  12/16/2016        372  09/30/2016        358  07/21/2016        362  04/22/2016        374  01/21/2016      390  12/27/2015        387   09/10/2015       372   07/24/15 389 lb 6.4 oz (176.631 kg)  07/08/15 384 lb 3.2 oz (174.272 kg)  06/21/15 390 lb 8 oz (177.13 kg)    Vital signs reviewed - Note on arrival 02 sats  99% on RA       HEENT: nl dentition, turbinates bilaterally, and oropharynx. Nl external ear canals without cough reflex   NECK :  without JVD/Nodes/TM/ nl carotid upstrokes bilaterally   LUNGS: no acc muscle use,  Nl contour chest which is clear to A and P bilaterally without cough on insp or exp maneuvers   CV:  RRR  no s3 or murmur or increase in P2, and   trace bilateral sym ankle pitting  edema   ABD:  Massively obese but soft and nontender with limited  inspiratory excursion in the supine position. No bruits or organomegaly appreciated, bowel sounds nl  MS:  Nl gait/ ext warm without deformities, calf tenderness, cyanosis or clubbing No obvious joint  restrictions   SKIN: warm and dry without lesions    NEURO:  alert, approp,  nl sensorium with  no motor or cerebellar deficits apparent.          I personally reviewed images and agree with radiology impression as follows:  CXR:   03/10/17 Similar cardiomegaly. Increased vascular congestion. Findings suggest fluid overload/CHF.             Assessment:

## 2017-06-16 NOTE — Patient Instructions (Signed)
No change in medications    Please schedule a follow up visit in 6  months but call sooner if needed  

## 2017-06-18 ENCOUNTER — Encounter: Payer: Self-pay | Admitting: Internal Medicine

## 2017-06-18 ENCOUNTER — Other Ambulatory Visit: Payer: Self-pay | Admitting: Internal Medicine

## 2017-06-18 NOTE — Assessment & Plan Note (Signed)
singulair maint rx as of 09/10/2015 with NO = 31 / no saba or other controller needed  - admit 12/14/15 with flare ? Assoc with chf/ cap - PFT's  01/21/2016  FEV1 2.11 (71 % ) ratio 84  p 12 % improvement from saba p breo 100 and on last day of pred taper  prior to study with DLCO  73/82  % corrects to 120 % for alv volume  - 01/21/2016    change to symbicort  160 2bid  - FENO 04/22/2016  =   16 on symb 160 with sub optimal technique and singuair > d/c'd singulair  - 04/22/2016    try symbicort 80 2bid - Allergy profile  09/30/16  >  Eos 0.0 /  IgE 7 RAST neg    - 09/30/2016  After extensive coaching HFA effectiveness =    90% - restart singulair 10 mg each am 12/16/2016 >>>   Despite issues with MO and chf/ ? Component of cardiac asthma > All goals of chronic asthma control met including optimal function and elimination of symptoms with minimal need for rescue therapy.  Contingencies discussed in full including contacting this office immediately if not controlling the symptoms using the rule of two's.     Each maintenance medication was reviewed in detail including most importantly the difference between maintenance and as needed and under what circumstances the prns are to be used.  Please see AVS for specific  Instructions which are unique to this visit and I personally typed out  which were reviewed in detail in writing with the patient and a copy provided.

## 2017-06-18 NOTE — Assessment & Plan Note (Signed)
ERV  01/21/2016  = 24% c/w body habitus   Body mass index is 55.32 kg/m.  -  trending = no real change  Lab Results  Component Value Date   TSH 1.77 03/23/2017     Contributing to gerd risk/ doe/reviewed the need and the process to achieve and maintain neg calorie balance > defer f/u primary care including intermittently monitoring thyroid status

## 2017-06-22 ENCOUNTER — Ambulatory Visit (HOSPITAL_COMMUNITY)
Admission: RE | Admit: 2017-06-22 | Discharge: 2017-06-22 | Disposition: A | Discharge status: Home / Self Care | Payer: Managed Care, Other (non HMO) | Source: Ambulatory Visit | Attending: Internal Medicine | Admitting: Internal Medicine

## 2017-06-22 ENCOUNTER — Encounter (HOSPITAL_COMMUNITY): Payer: Self-pay

## 2017-06-22 VITALS — BP 184/106 | HR 129 | Wt 375.0 lb

## 2017-06-22 DIAGNOSIS — N183 Chronic kidney disease, stage 3 unspecified: Secondary | ICD-10-CM

## 2017-06-22 DIAGNOSIS — I480 Paroxysmal atrial fibrillation: Secondary | ICD-10-CM

## 2017-06-22 DIAGNOSIS — I4892 Unspecified atrial flutter: Secondary | ICD-10-CM | POA: Insufficient documentation

## 2017-06-22 DIAGNOSIS — I10 Essential (primary) hypertension: Secondary | ICD-10-CM

## 2017-06-22 DIAGNOSIS — G4733 Obstructive sleep apnea (adult) (pediatric): Secondary | ICD-10-CM

## 2017-06-22 DIAGNOSIS — I5032 Chronic diastolic (congestive) heart failure: Secondary | ICD-10-CM | POA: Diagnosis not present

## 2017-06-22 DIAGNOSIS — I4891 Unspecified atrial fibrillation: Secondary | ICD-10-CM | POA: Insufficient documentation

## 2017-06-22 DIAGNOSIS — Z9989 Dependence on other enabling machines and devices: Secondary | ICD-10-CM

## 2017-06-22 DIAGNOSIS — I483 Typical atrial flutter: Secondary | ICD-10-CM

## 2017-06-22 LAB — CBC
HEMATOCRIT: 42 % (ref 39.0–52.0)
Hemoglobin: 13.1 g/dL (ref 13.0–17.0)
MCH: 27.8 pg (ref 26.0–34.0)
MCHC: 31.2 g/dL (ref 30.0–36.0)
MCV: 89 fL (ref 78.0–100.0)
PLATELETS: 163 10*3/uL (ref 150–400)
RBC: 4.72 MIL/uL (ref 4.22–5.81)
RDW: 15.4 % (ref 11.5–15.5)
WBC: 4.5 10*3/uL (ref 4.0–10.5)

## 2017-06-22 LAB — BASIC METABOLIC PANEL
Anion gap: 9 (ref 5–15)
BUN: 38 mg/dL — ABNORMAL HIGH (ref 6–20)
CALCIUM: 9.5 mg/dL (ref 8.9–10.3)
CHLORIDE: 102 mmol/L (ref 101–111)
CO2: 29 mmol/L (ref 22–32)
CREATININE: 2.45 mg/dL — AB (ref 0.61–1.24)
GFR calc Af Amer: 33 mL/min — ABNORMAL LOW (ref >60)
GFR calc non Af Amer: 28 mL/min — ABNORMAL LOW (ref >60)
GLUCOSE: 109 mg/dL — AB (ref 65–99)
Potassium: 4 mmol/L (ref 3.5–5.1)
Sodium: 140 mmol/L (ref 135–145)

## 2017-06-22 LAB — MAGNESIUM: Magnesium: 2.4 mg/dL (ref 1.7–2.4)

## 2017-06-22 MED ORDER — CARVEDILOL 6.25 MG PO TABS: 6.2500 mg | ORAL_TABLET | Freq: Two times a day (BID) | ORAL | 11 refills | Status: DC

## 2017-06-22 MED ORDER — HYDRALAZINE HCL 50 MG PO TABS: 50.0000 mg | ORAL_TABLET | Freq: Three times a day (TID) | ORAL | 11 refills | Status: DC

## 2017-06-22 NOTE — Progress Notes (Signed)
Advanced Heart Failure Clinic Note   Referring Physician: [name] Primary Care: Silvio Pate Primary Cardiologist: [name]   HPI:  Phillip [name] is a 57 y.o. male hx of PAF, remote PE, hx of DVT, OSA, morbid obesity and HTN also with combined chronic systolic and diastolic HF. With EF 40-45%. He has been wearing CPAP. With CHA2DS2VASc score 2 and pt on xarelto.   Echo 12/21/16 with EF 55-60% G2DD, mild aortic regurgitation. Last nuc 2008 no ischemia, no hx of cath. Referred by Dr. [name] for assistance with HF management.   He has had multiple admissions to the hospital for volume overload/acute on chronic diastolic heart failure. Last admit in 12/18 d/c weight was 368. Lowest weight in last few month recorded 353.   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   He presents today for follow up. Refused med titration last visit. Has been feeling tired for the past week. Weight up 7 lbs.  He is in A-flutter by EKG. He states ablation has not previously been mentioned to him.  He denies palpitations, CP, or SOB. No lightheadedness or dizziness. Only complaint is weight gain and fatigue over past week. He has not missed any doses of his Xarelto. He has not had any bleeding.  He has been watching his fluid and salt intake. Taking all medications as directed. He states he has always taken hydralazine BID. He uses his CPAP nightly. He has not actively been trying to lose weight. Denies ETOH intake.   Review of systems complete and found to be negative unless listed in HPI.    Past Medical History:  Diagnosis Date  . CHF (congestive heart failure) ([facility]) 11/15   EF 40-45%, improved with follow-up  . COPD (chronic obstructive pulmonary disease) ([facility])   . DVT (deep vein thrombosis) in pregnancy ([facility])   . Gout   . Hypertension   . Hypertensive cardiovascular disease   . Kidney dysfunction   . Morbid obesity ([facility])   . OSA on CPAP   . Paroxysmal atrial fibrillation ([facility])   . PNA  (pneumonia)   . Pulmonary embolism ([facility]) 2010   left leg DVT   Current Outpatient Medications  Medication Sig Dispense Refill  . albuterol (PROVENTIL HFA;VENTOLIN HFA) 108 (90 Base) MCG/ACT inhaler Inhale 2 puffs into the lungs every 4 (four) hours as needed for wheezing or shortness of breath. 1 Inhaler 0  . albuterol (PROVENTIL) (2.5 MG/3ML) 0.083% nebulizer solution Take 3 mLs (2.5 mg total) by nebulization every 6 (six) hours as needed for wheezing or shortness of breath. 360 mL 11  . allopurinol (ZYLOPRIM) 300 MG tablet Take 1 tablet (300 mg total) daily by mouth. 90 tablet 3  . budesonide-formoterol (SYMBICORT) 80-4.5 MCG/ACT inhaler Inhale 2 puffs 2 (two) times daily into the lungs. 3 Inhaler 3  . carvedilol (COREG) 3.125 MG tablet TAKE 1 TABLET (3.125 MG TOTAL) BY MOUTH 2 (TWO) TIMES DAILY WITH A MEAL. 180 tablet 1  . cloNIDine (CATAPRES) 0.1 MG tablet TAKE 1 TABLET BY MOUTH 3 TIMES A DAY 90 tablet 1  . colchicine 0.6 MG tablet Take 1 tablet (0.6 mg total) by mouth daily. (Patient taking differently: Take 0.6 mg by mouth daily as needed (gout). ) 30 tablet 0  . hydrALAZINE (APRESOLINE) 50 MG tablet Take 1 tablet (50 mg total) by mouth 2 (two) times daily. 60 tablet 3  . montelukast (SINGULAIR) 10 MG tablet Take 1 tablet (10 mg total) by mouth at bedtime. 30 tablet  11  . OXYGEN Inhale 2 L into the lungs daily. 2lpm when needed per pt     . oxymetazoline (AFRIN NASAL SPRAY) 0.05 % nasal spray Place 1 spray into both nostrils 2 (two) times daily as needed for congestion.    . potassium chloride SA (K-DUR,KLOR-CON) 20 MEQ tablet Take 1.5 tablets (30 mEq total) by mouth daily. 90 tablet 3  . spironolactone (ALDACTONE) 25 MG tablet Take 1 tablet (25 mg total) by mouth daily. 30 tablet 3  . torsemide (DEMADEX) 20 MG tablet Take 3 tablets (60 mg total) by mouth 2 (two) times daily. 540 tablet 3  . UNABLE TO FIND Med Name: CPAP with sleep    . XARELTO 20 MG TABS tablet TAKE 1 TABLET BY MOUTH  DAILY WITH SUPPER 30 tablet 5   No current facility-administered medications for this encounter.    No Known Allergies  Social History   Socioeconomic History  . Marital status: Divorced    Spouse name: Not on file  . Number of children: 2  . Years of education: Not on file  . Highest education level: Not on file  Occupational History  . 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 Occupation: Airline pilot    Comment: Disabled  Social Needs  . Financial resource strain: Not on file  . Food insecurity:    Worry: Not on file    Inability: Not on file  . Transportation needs:    Medical: Not on file    Non-medical: Not on file  Tobacco Use  . Smoking status: Former Smoker    Packs/day: 0.25    Years: 1.00    Pack years: 0.25    Types: Cigarettes    Last attempt to quit: 07/23/1989    Years since quitting: 27.9  . Smokeless tobacco: Never Used  Substance and Sexual Activity  . Alcohol use: No    Alcohol/week: 0.0 oz  . Drug use: No  . Sexual activity: Not Currently  Lifestyle  . Physical activity:    Days per week: Not on file    Minutes per session: Not on file  . Stress: Not on file  Relationships  . Social connections:    Talks on phone: Not on file    Gets together: Not on file    Attends religious service: Not on file    Active member of club or organization: Not on file    Attends meetings of clubs or organizations: Not on file    Relationship status: Not on file  . Intimate partner violence:    Fear of current or ex partner: Not on file    Emotionally abused: Not on file    Physically abused: Not on file    Forced sexual activity: Not on file  Other Topics Concern  . Not on file  Social History Narrative   Pt lives in [place], alone.   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   Has 2 sons in [place]      No living will   Would want sons to make decisions for him   Would accept resuscitation    Family History  Problem Relation  Age of Onset  . Hypertension Mother   . Diabetes Father   . CAD Father   . Stomach cancer Father   . Clotting disorder Father   . Heart disease Father        transplant  . Allergies Brother    Vitals:  06/22/17 0943  BP: (!) 184/106  Pulse: (!) 129  SpO2: 94%  Weight: (!) 375 lb (170.1 kg)   Wt Readings from Last 3 Encounters:  06/22/17 (!) 375 lb (170.1 kg)  06/16/17 (!) 374 lb 9.6 oz (169.9 kg)  05/11/17 (!) 368 lb 3.2 oz (167 kg)   PHYSICAL EXAM: General: Morbidly obese. NAD.  HEENT: Normal anicteric Neck: Supple. JVP hard to assess. Does not llook overly elevated . Carotids 2+ bilat; no bruits. No thyromegaly or nodule noted. Cor: PMI nonpalpable. Irregularly irregular. No M/G/R noted Lungs: CTAB, normal effort. No wheeze Abdomen: Markedly obese. Soft, non-tender, non-distended. Unable to assess for HSM  No bruits or masses. +BS  Extremities: No cyanosis, clubbing, or rash. Trace ankle edema. Warm Neuro: alert & oriented x 3, cranial nerves grossly intact. moves all 4 extremities w/o difficulty. Affect pleasant  ASSESSMENT & PLAN:  1. Chronic diastolic HF - Echo 08/9676 LVEF 55-60%, Grade 2 DD.  - Volume status stable to mildly elevated.  - NYHA II-III in A-flutter. No SOB, mostly fatigue.  - Continue torsemide 60 mg BID for now. May need to take 80 mg BID as needed in Aflutter. BMET today.  - Continue spiro 25 mg daily.  - Reinforced fluid restriction to < 2 L daily, sodium restriction to less than 2000 mg daily, and the importance of daily weights.    2. PAF / Typical A-flutter, paroxysmal - EKG today with Aflutter in 100s - He is on Xarelto 20 mg daily. Appropriate dose per Pharm-D. Discussed personally.  - Will send to Dr. Lovena Le for AFL ablation consideration.   3. Hypertension - Elevated this am. He states he took his medicine. In [place] as above.  - Relatively stable at appt 06/16/17. - Increase hydralazine 50 TID - Increase coreg to 6.25 mg BID.  -  Continue spiro 25 mg daily.  - Follow BP daily.   6. Morbid obesity - Body mass index is 55.38 kg/m.  - Needs weight loss. Interested in bariatric surgery "eventually" but wants to continue to work on his own for now.   5. CKD IV - BMET today.   6. OSA on CPAP - Encouraged continued nightly use.   7. Screening - His EF was normal in September.  - He is OK for colonoscopy with low/moderate risk for peri-operative complications due to his body habitus and OSA.   Will send to EP for AFL ablation. If cannot do in the next week or two, can consider DCCV in the meantime.  Meds and labs as above. RTC 4 weeks   Shirley Friar, PA-C  9:58 AM   Patient seen and examined with the above-signed Advanced Practice Provider and/or Housestaff. I personally reviewed laboratory data, imaging studies and relevant notes. I independently examined the patient and formulated the important aspects of the plan. I have edited the note to reflect any of my changes or salient points. I have personally discussed the plan with the patient and/or family.  57 y/o male with marked obesity, OSA, HTN and PAF. Now presents with symptomatic atrial flutter. Has not had recent PAF that I can see. Has been tolerating AC. Will refer to EP for consideration of AFL ablation. Stressed need to Mr. Whilden of the need to lose weight and control BP as he will almost certainly have recurrent AF if he does not control these RFs. Agree with BP med changes as above.   Glori Bickers, MD  2:19 PM

## 2017-06-22 NOTE — Patient Instructions (Addendum)
Routine lab work today. Will notify you of abnormal results, otherwise no news is good news!  Will refer you to electrophysiology at [facility] to schedule aflutter ablation. Address: [address] ([place]), [place], [place] [address]  Phone: [phone] Their office will call you to schedule.  INCREASE Carvedilol (Coreg) to 6.25 mg twice daily. Can double up on current 3.125 mg tablets you have at home (Take 2 tabs twice daily). New Rx has been sent to your pharmacy for 6.25 mg tablets (Take 1 tab twice daily).  INCREASE Hydralazine to 50 mg tablet THREE times daily. Set alarms on your phone to prevent missing doses.  Routine lab work today. Will notify you of abnormal results, otherwise no news is good news!  Follow up 4 weeks with Oda Kilts PA-C.  ___________________________________________________________ Marin Roberts Code:  Follow up 3 months with Dr. Haroldine Laws. We will call you closer to this time, or you may call our office to schedule 1 month before you are due to be seen.  Take all medication as prescribed the day of your appointment. Bring all medications with you to your appointment.  Do the following things EVERYDAY: 1) Weigh yourself in the morning before breakfast. Write it down and keep it in a log. 2) Take your medicines as prescribed 3) Eat low salt foods-Limit salt (sodium) to 2000 mg per day.  4) Stay as active as you can everyday 5) Limit all fluids for the day to less than 2 liters

## 2017-07-01 ENCOUNTER — Ambulatory Visit: Payer: Managed Care, Other (non HMO) | Admitting: Podiatry

## 2017-07-01 ENCOUNTER — Ambulatory Visit: Payer: Managed Care, Other (non HMO) | Admitting: Internal Medicine

## 2017-07-01 ENCOUNTER — Encounter: Payer: Self-pay | Admitting: Internal Medicine

## 2017-07-01 ENCOUNTER — Encounter: Payer: Self-pay | Admitting: Podiatry

## 2017-07-01 VITALS — BP 124/74 | HR 102 | Ht 69.0 in | Wt 374.0 lb

## 2017-07-01 DIAGNOSIS — I5032 Chronic diastolic (congestive) heart failure: Secondary | ICD-10-CM

## 2017-07-01 DIAGNOSIS — M79676 Pain in unspecified toe(s): Secondary | ICD-10-CM

## 2017-07-01 DIAGNOSIS — I480 Paroxysmal atrial fibrillation: Secondary | ICD-10-CM | POA: Diagnosis not present

## 2017-07-01 DIAGNOSIS — B351 Tinea unguium: Secondary | ICD-10-CM

## 2017-07-01 DIAGNOSIS — I483 Typical atrial flutter: Secondary | ICD-10-CM | POA: Diagnosis not present

## 2017-07-01 NOTE — Patient Instructions (Addendum)
Medication Instructions:  Your physician recommends that you continue on your current medications as directed. Please refer to the Current Medication list given to you today.  Labwork: You will get lab work July 20, 2017.  You can come anytime between 8:00 am and 4:30 pm.  BMP and CBC.  Testing/Procedures: Your physician has recommended that you have an ablation. Catheter ablation is a medical procedure used to treat some cardiac arrhythmias (irregular heartbeats). During catheter ablation, a long, thin, flexible tube is put into a blood vessel in your groin (upper thigh), or neck. This tube is called an ablation catheter. It is then guided to your heart through the blood vessel. Radio frequency waves destroy small areas of heart tissue where abnormal heartbeats may cause an arrhythmia to start. Please see the instruction sheet given to you today.  Follow-Up:  You will follow up with Dr. Rayann Heman 4 weeks after your procedure.  Any Other Special Instructions Will Be Listed Below (If Applicable).  Please arrive at the [facility] main entrance of [facility] hospital at:  6:30 am on Jul 22, 2017 Do not eat or drink after midnight prior to procedure Do not take any medications the morning of the procedure Plan for one night stay-but you may be discharged same day You will need someone to drive you home at discharge  If you need a refill on your cardiac medications before your next appointment, please call your pharmacy.   Cardiac Ablation Cardiac ablation is a procedure to disable (ablate) a small amount of heart tissue in very specific places. The heart has many electrical connections. Sometimes these connections are abnormal and can cause the heart to beat very fast or irregularly. Ablating some of the problem areas can improve the heart rhythm or return it to normal. Ablation may be done for people who:  Have Wolff-Parkinson-White syndrome.  Have fast heart rhythms (tachycardia).  Have  taken medicines for an abnormal heart rhythm (arrhythmia) that were not effective or caused side effects.  Have a high-risk heartbeat that may be life-threatening.  During the procedure, a small incision is made in the neck or the groin, and a long, thin, flexible tube (catheter) is inserted into the incision and moved to the heart. Small devices (electrodes) on the tip of the catheter will send out electrical currents. A type of X-ray (fluoroscopy) will be used to help guide the catheter and to provide images of the heart. Tell a health care provider about:  Any allergies you have.  All medicines you are taking, including vitamins, herbs, eye drops, creams, and over-the-counter medicines.  Any problems you or family members have had with anesthetic medicines.  Any blood disorders you have.  Any surgeries you have had.  Any medical conditions you have, such as kidney failure.  Whether you are pregnant or may be pregnant. What are the risks? Generally, this is a safe procedure. However, problems may occur, including:  Infection.  Bruising and bleeding at the catheter insertion site.  Bleeding into the chest, especially into the sac that surrounds the heart. This is a serious complication.  Stroke or blood clots.  Damage to other structures or organs.  Allergic reaction to medicines or dyes.  Need for a permanent pacemaker if the normal electrical system is damaged. A pacemaker is a small computer that sends electrical signals to the heart and helps your heart beat normally.  The procedure not being fully effective. This may not be recognized until months later. Repeat ablation  procedures are sometimes required.  What happens before the procedure?  Follow instructions from your health care provider about eating or drinking restrictions.  Ask your health care provider about: ? Changing or stopping your regular medicines. This is especially important if you are taking diabetes  medicines or blood thinners. ? Taking medicines such as aspirin and ibuprofen. These medicines can thin your blood. Do not take these medicines before your procedure if your health care provider instructs you not to.  Plan to have someone take you home from the hospital or clinic.  If you will be going home right after the procedure, plan to have someone with you for 24 hours. What happens during the procedure?  To lower your risk of infection: ? Your health care team will wash or sanitize their hands. ? Your skin will be washed with soap. ? Hair may be removed from the incision area.  An IV tube will be inserted into one of your veins.  You will be given a medicine to help you relax (sedative).  The skin on your neck or groin will be numbed.  An incision will be made in your neck or your groin.  A needle will be inserted through the incision and into a large vein in your neck or groin.  A catheter will be inserted into the needle and moved to your heart.  Dye may be injected through the catheter to help your surgeon see the area of the heart that needs treatment.  Electrical currents will be sent from the catheter to ablate heart tissue in desired areas. There are three types of energy that may be used to ablate heart tissue: ? Heat (radiofrequency energy). ? Laser energy. ? Extreme cold (cryoablation).  When the necessary tissue has been ablated, the catheter will be removed.  Pressure will be held on the catheter insertion area to prevent excessive bleeding.  A bandage (dressing) will be placed over the catheter insertion area. The procedure may vary among health care providers and hospitals. What happens after the procedure?  Your blood pressure, heart rate, breathing rate, and blood oxygen level will be monitored until the medicines you were given have worn off.  Your catheter insertion area will be monitored for bleeding. You will need to lie still for a few hours to  ensure that you do not bleed from the catheter insertion area.  Do not drive for 24 hours or as long as directed by your health care provider. Summary  Cardiac ablation is a procedure to disable (ablate) a small amount of heart tissue in very specific places. Ablating some of the problem areas can improve the heart rhythm or return it to normal.  During the procedure, electrical currents will be sent from the catheter to ablate heart tissue in desired areas. This information is not intended to replace advice given to you by your health care provider. Make sure you discuss any questions you have with your health care provider. Document Released: 07/27/2008 Document Revised: 01/28/2016 Document Reviewed: 01/28/2016 Elsevier Interactive Patient Education  [employer].

## 2017-07-01 NOTE — Progress Notes (Signed)
Electrophysiology Office Note   Date:  07/01/2017   ID:  Phillip [name], Phillip [name] November 08, 1960, MRN [id number]  PCP:  [name], MD  Cardiologist:  Dr Haroldine Laws Primary Electrophysiologist: Thompson Grayer, MD    CC: atrial flutter   History of Present Illness: Phillip [name] is a 58 y.o. male who presents today for electrophysiology evaluation.   He is referred by Dr Haroldine Laws for EP consultation regarding atrial flutter.  The patient has a h/o obesity, nonischemc CM, hypertensive cardiovascular disease, CRF (stage IV), and DVT.  He has lost 50 lbs and is working diligently for lifestyle modification.  He has recently been found to have typical appearing atrial flutter.  He is mostly unaware.  He does have some palpitations.  SOB is stable.  + edema.  Today, he denies symptoms of palpitations, chest pain,  claudication, dizziness, presyncope, syncope, bleeding, or neurologic sequela. The patient is tolerating medications without difficulties and is otherwise without complaint today.    Past Medical History:  Diagnosis Date  . CHF (congestive heart failure) ([place]) 11/15   EF 40-45%, improved with follow-up  . COPD (chronic obstructive pulmonary disease) ([place])   . DVT (deep vein thrombosis) in pregnancy ([place])   . Gout   . Hypertension   . Hypertensive cardiovascular disease   . Kidney dysfunction   . Morbid obesity ([place])   . OSA on CPAP   . Paroxysmal atrial fibrillation ([facility])   . PNA (pneumonia)   . Pulmonary embolism ([place]) 2010   left leg DVT   Past Surgical History:  Procedure Laterality Date  . UMBILICAL HERNIA REPAIR  1992     Current Outpatient Medications  Medication Sig Dispense Refill  . albuterol (PROVENTIL HFA;VENTOLIN HFA) 108 (90 Base) MCG/ACT inhaler Inhale 2 puffs into the lungs every 4 (four) hours as needed for wheezing or shortness of breath. 1 Inhaler 0  . albuterol (PROVENTIL) (2.5 MG/3ML) 0.083% nebulizer solution Take 3 mLs (2.5 mg total) by nebulization every  6 (six) hours as needed for wheezing or shortness of breath. 360 mL 11  . allopurinol (ZYLOPRIM) 300 MG tablet Take 1 tablet (300 mg total) daily by mouth. 90 tablet 3  . budesonide-formoterol (SYMBICORT) 80-4.5 MCG/ACT inhaler Inhale 2 puffs 2 (two) times daily into the lungs. 3 Inhaler 3  . carvedilol (COREG) 6.25 MG tablet Take 1 tablet (6.25 mg total) by mouth 2 (two) times daily with a meal. 60 tablet 11  . cloNIDine (CATAPRES) 0.1 MG tablet TAKE 1 TABLET BY MOUTH 3 TIMES A DAY 90 tablet 1  . colchicine 0.6 MG tablet Take 1 tablet (0.6 mg total) by mouth daily. (Patient taking differently: Take 0.6 mg by mouth daily as needed (gout). ) 30 tablet 0  . hydrALAZINE (APRESOLINE) 50 MG tablet Take 1 tablet (50 mg total) by mouth 3 (three) times daily. 90 tablet 11  . montelukast (SINGULAIR) 10 MG tablet Take 1 tablet (10 mg total) by mouth at bedtime. 30 tablet 11  . OXYGEN Inhale 2 L into the lungs daily. 2lpm when needed per pt     . oxymetazoline (AFRIN NASAL SPRAY) 0.05 % nasal spray Place 1 spray into both nostrils 2 (two) times daily as needed for congestion.    . potassium chloride SA (K-DUR,KLOR-CON) 20 MEQ tablet Take 1.5 tablets (30 mEq total) by mouth daily. 90 tablet 3  . torsemide (DEMADEX) 20 MG tablet Take 3 tablets (60 mg total) by mouth 2 (two) times daily. 540 tablet 3  .  UNABLE TO FIND Med Name: CPAP with sleep    . XARELTO 20 MG TABS tablet TAKE 1 TABLET BY MOUTH DAILY WITH SUPPER 30 tablet 5  . spironolactone (ALDACTONE) 25 MG tablet Take 1 tablet (25 mg total) by mouth daily. 30 tablet 3   No current facility-administered medications for this visit.     Allergies:   Patient has no known allergies.   Social History:  The patient  reports that he quit smoking about 27 years ago. His smoking use included cigarettes. He has a 0.25 pack-year smoking history. He has never used smokeless tobacco. He reports that he does not drink alcohol or use drugs.   Family History:  The  patient's family history includes Allergies in his brother; CAD in his father; Clotting disorder in his father; Diabetes in his father; Heart disease in his father; Hypertension in his mother; Stomach cancer in his father.    ROS:  Please see the history of present illness.   All other systems are personally reviewed and negative.    PHYSICAL EXAM: VS:  BP 124/74   Pulse (!) 102   Ht 5\' 9"  (1.753 m)   Wt (!) 374 lb (169.6 kg)   BMI 55.23 kg/m  , BMI Body mass index is 55.23 kg/m. GEN: morbidly obese, in no acute distress  HEENT: normal  Neck: no JVD, carotid bruits, or masses Cardiac: iRRR; no murmurs, rubs, or gallops, + dependant edema  Respiratory:  clear to auscultation bilaterally, normal work of breathing GI: soft, nontender, nondistended, + BS MS: no deformity or atrophy  Skin: warm and dry  Neuro:  Strength and sensation are intact Psych: euthymic mood, full affect  EKG:  EKG is ordered today. The ekg ordered today is personally reviewed and shows typical appearing atrial flutter   Recent Labs: 09/30/2016: Pro B Natriuretic peptide (BNP) 877.0 03/10/2017: B Natriuretic Peptide 675.0 03/23/2017: TSH 1.77 06/22/2017: BUN 38; Creatinine, Ser 2.45; Hemoglobin 13.1; Magnesium 2.4; Platelets 163; Potassium 4.0; Sodium 140  personally reviewed   Lipid Panel     Component Value Date/Time   CHOL 175 02/21/2015 0431   TRIG 37 02/21/2015 0431   HDL 66 02/21/2015 0431   CHOLHDL 2.7 02/21/2015 0431   VLDL 7 02/21/2015 0431   LDLCALC 102 (H) 02/21/2015 0431   personally reviewed   Wt Readings from Last 3 Encounters:  07/01/17 (!) 374 lb (169.6 kg)  06/22/17 (!) 375 lb (170.1 kg)  06/16/17 (!) 374 lb 9.6 oz (169.9 kg)      Other studies personally reviewed: Additional studies/ records that were reviewed today include: CHF clinic notes, prior echo  Review of the above records today demonstrates: EF preserved,  Severe LA enlargement   ASSESSMENT AND PLAN:  1.   Typical appearing atrial flutter The patient has typical appearing atrial flutter.  V rates are elevated.  I worry that this may exacerbate his CHF.  Unfortunately, he also has severe LA enlargement.  I worry that he is at risk for afib at some point.  As he has not had afib documented within the past year or so that I can see (I have reviewed ekgs for the past year).  He does have several episodes of atrial flutter. I do think that it is reasonable to proceed with atrial flutter ablation. Therapeutic strategies for atrial flutter including medicine and ablation were discussed in detail with the patient today. Risk, benefits, and alternatives to EP study and radiofrequency ablation were also discussed in  detail today. These risks include but are not limited to stroke, bleeding, vascular damage, tamponade, perforation, damage to the heart and other structures, AV block requiring pacemaker, worsening renal function, and death. The patient understands these risk and wishes to proceed.  We will therefore proceed with catheter ablation at the next available time.  Carto and anesthesia are requested for this procedure.  Continue xarelto without interruption.    Current medicines are reviewed at length with the patient today.   The patient does not have concerns regarding his medicines.  The following changes were made today:  none  Labs/ tests ordered today include:  Orders Placed This Encounter  Procedures  . EKG 12-Lead     Signed, Thompson Grayer, MD  07/01/2017 9:11 AM     [facility] [address] [place] [address] [phone] (office) [phone] (fax)

## 2017-07-01 NOTE — H&P (View-Only) (Signed)
Electrophysiology Office Note   Date:  07/01/2017   ID:  Phillip [name], Phillip [name] 1960-06-09, MRN [id number]  PCP:  [name], MD  Cardiologist:  Dr Haroldine Laws Primary Electrophysiologist: Thompson Grayer, MD    CC: atrial flutter   History of Present Illness: Phillip [name] is a 57 y.o. male who presents today for electrophysiology evaluation.   He is referred by Dr Haroldine Laws for EP consultation regarding atrial flutter.  The patient has a h/o obesity, nonischemc CM, hypertensive cardiovascular disease, CRF (stage IV), and DVT.  He has lost 50 lbs and is working diligently for lifestyle modification.  He has recently been found to have typical appearing atrial flutter.  He is mostly unaware.  He does have some palpitations.  SOB is stable.  + edema.  Today, he denies symptoms of palpitations, chest pain,  claudication, dizziness, presyncope, syncope, bleeding, or neurologic sequela. The patient is tolerating medications without difficulties and is otherwise without complaint today.    Past Medical History:  Diagnosis Date  . CHF (congestive heart failure) ([place]) 11/15   EF 40-45%, improved with follow-up  . COPD (chronic obstructive pulmonary disease) ([place])   . DVT (deep vein thrombosis) in pregnancy ([place])   . Gout   . Hypertension   . Hypertensive cardiovascular disease   . Kidney dysfunction   . Morbid obesity ([place])   . OSA on CPAP   . Paroxysmal atrial fibrillation ([facility])   . PNA (pneumonia)   . Pulmonary embolism ([place]) 2010   left leg DVT   Past Surgical History:  Procedure Laterality Date  . UMBILICAL HERNIA REPAIR  1992     Current Outpatient Medications  Medication Sig Dispense Refill  . albuterol (PROVENTIL HFA;VENTOLIN HFA) 108 (90 Base) MCG/ACT inhaler Inhale 2 puffs into the lungs every 4 (four) hours as needed for wheezing or shortness of breath. 1 Inhaler 0  . albuterol (PROVENTIL) (2.5 MG/3ML) 0.083% nebulizer solution Take 3 mLs (2.5 mg total) by nebulization every  6 (six) hours as needed for wheezing or shortness of breath. 360 mL 11  . allopurinol (ZYLOPRIM) 300 MG tablet Take 1 tablet (300 mg total) daily by mouth. 90 tablet 3  . budesonide-formoterol (SYMBICORT) 80-4.5 MCG/ACT inhaler Inhale 2 puffs 2 (two) times daily into the lungs. 3 Inhaler 3  . carvedilol (COREG) 6.25 MG tablet Take 1 tablet (6.25 mg total) by mouth 2 (two) times daily with a meal. 60 tablet 11  . cloNIDine (CATAPRES) 0.1 MG tablet TAKE 1 TABLET BY MOUTH 3 TIMES A DAY 90 tablet 1  . colchicine 0.6 MG tablet Take 1 tablet (0.6 mg total) by mouth daily. (Patient taking differently: Take 0.6 mg by mouth daily as needed (gout). ) 30 tablet 0  . hydrALAZINE (APRESOLINE) 50 MG tablet Take 1 tablet (50 mg total) by mouth 3 (three) times daily. 90 tablet 11  . montelukast (SINGULAIR) 10 MG tablet Take 1 tablet (10 mg total) by mouth at bedtime. 30 tablet 11  . OXYGEN Inhale 2 L into the lungs daily. 2lpm when needed per pt     . oxymetazoline (AFRIN NASAL SPRAY) 0.05 % nasal spray Place 1 spray into both nostrils 2 (two) times daily as needed for congestion.    . potassium chloride SA (K-DUR,KLOR-CON) 20 MEQ tablet Take 1.5 tablets (30 mEq total) by mouth daily. 90 tablet 3  . torsemide (DEMADEX) 20 MG tablet Take 3 tablets (60 mg total) by mouth 2 (two) times daily. 540 tablet 3  .  UNABLE TO FIND Med Name: CPAP with sleep    . XARELTO 20 MG TABS tablet TAKE 1 TABLET BY MOUTH DAILY WITH SUPPER 30 tablet 5  . spironolactone (ALDACTONE) 25 MG tablet Take 1 tablet (25 mg total) by mouth daily. 30 tablet 3   No current facility-administered medications for this visit.     Allergies:   Patient has no known allergies.   Social History:  The patient  reports that he quit smoking about 27 years ago. His smoking use included cigarettes. He has a 0.25 pack-year smoking history. He has never used smokeless tobacco. He reports that he does not drink alcohol or use drugs.   Family History:  The  patient's family history includes Allergies in his brother; CAD in his father; Clotting disorder in his father; Diabetes in his father; Heart disease in his father; Hypertension in his mother; Stomach cancer in his father.    ROS:  Please see the history of present illness.   All other systems are personally reviewed and negative.    PHYSICAL EXAM: VS:  BP 124/74   Pulse (!) 102   Ht 5\' 9"  (1.753 m)   Wt (!) 374 lb (169.6 kg)   BMI 55.23 kg/m  , BMI Body mass index is 55.23 kg/m. GEN: morbidly obese, in no acute distress  HEENT: normal  Neck: no JVD, carotid bruits, or masses Cardiac: iRRR; no murmurs, rubs, or gallops, + dependant edema  Respiratory:  clear to auscultation bilaterally, normal work of breathing GI: soft, nontender, nondistended, + BS MS: no deformity or atrophy  Skin: warm and dry  Neuro:  Strength and sensation are intact Psych: euthymic mood, full affect  EKG:  EKG is ordered today. The ekg ordered today is personally reviewed and shows typical appearing atrial flutter   Recent Labs: 09/30/2016: Pro B Natriuretic peptide (BNP) 877.0 03/10/2017: B Natriuretic Peptide 675.0 03/23/2017: TSH 1.77 06/22/2017: BUN 38; Creatinine, Ser 2.45; Hemoglobin 13.1; Magnesium 2.4; Platelets 163; Potassium 4.0; Sodium 140  personally reviewed   Lipid Panel     Component Value Date/Time   CHOL 175 02/21/2015 0431   TRIG 37 02/21/2015 0431   HDL 66 02/21/2015 0431   CHOLHDL 2.7 02/21/2015 0431   VLDL 7 02/21/2015 0431   LDLCALC 102 (H) 02/21/2015 0431   personally reviewed   Wt Readings from Last 3 Encounters:  07/01/17 (!) 374 lb (169.6 kg)  06/22/17 (!) 375 lb (170.1 kg)  06/16/17 (!) 374 lb 9.6 oz (169.9 kg)      Other studies personally reviewed: Additional studies/ records that were reviewed today include: CHF clinic notes, prior echo  Review of the above records today demonstrates: EF preserved,  Severe LA enlargement   ASSESSMENT AND PLAN:  1.   Typical appearing atrial flutter The patient has typical appearing atrial flutter.  V rates are elevated.  I worry that this may exacerbate his CHF.  Unfortunately, he also has severe LA enlargement.  I worry that he is at risk for afib at some point.  As he has not had afib documented within the past year or so that I can see (I have reviewed ekgs for the past year).  He does have several episodes of atrial flutter. I do think that it is reasonable to proceed with atrial flutter ablation. Therapeutic strategies for atrial flutter including medicine and ablation were discussed in detail with the patient today. Risk, benefits, and alternatives to EP study and radiofrequency ablation were also discussed in  detail today. These risks include but are not limited to stroke, bleeding, vascular damage, tamponade, perforation, damage to the heart and other structures, AV block requiring pacemaker, worsening renal function, and death. The patient understands these risk and wishes to proceed.  We will therefore proceed with catheter ablation at the next available time.  Carto and anesthesia are requested for this procedure.  Continue xarelto without interruption.    Current medicines are reviewed at length with the patient today.   The patient does not have concerns regarding his medicines.  The following changes were made today:  none  Labs/ tests ordered today include:  Orders Placed This Encounter  Procedures  . EKG 12-Lead     Signed, Thompson Grayer, MD  07/01/2017 9:11 AM     [facility] [address] [place] [address] [phone] (office) [phone] (fax)

## 2017-07-04 NOTE — Addendum Note (Signed)
Encounter addended by: Jolaine Artist, MD on: 07/04/2017 2:19 PM  Actions taken: LOS modified

## 2017-07-06 NOTE — Progress Notes (Signed)
   SUBJECTIVE Patient presents to office today complaining of elongated, thickened nails that cause pain while ambulating in shoes. He reports some soreness to the right great toenail but denies pain today. He is unable to trim his own nails. Patient is here for further evaluation and treatment.  Past Medical History:  Diagnosis Date  . CHF (congestive heart failure) ([place]) 11/15   EF 40-45%, improved with follow-up  . COPD (chronic obstructive pulmonary disease) ([place])   . Gout   . History of DVT (deep vein thrombosis)   . Hypertension   . Hypertensive cardiovascular disease   . Kidney dysfunction   . Morbid obesity ([place])   . OSA on CPAP   . Paroxysmal atrial fibrillation ([facility])   . PNA (pneumonia)   . Pulmonary embolism ([place]) 2010   left leg DVT    OBJECTIVE General Patient is awake, alert, and oriented x 3 and in no acute distress. Derm Skin is dry and supple bilateral. Negative open lesions or macerations. Remaining integument unremarkable. Nails are tender, long, thickened and dystrophic with subungual debris, consistent with onychomycosis, 1-5 bilateral. No signs of infection noted. Vasc  DP and PT pedal pulses palpable bilaterally. Temperature gradient within normal limits.  Neuro Epicritic and protective threshold sensation grossly intact bilaterally.  Musculoskeletal Exam No symptomatic pedal deformities noted bilateral. Muscular strength within normal limits.  ASSESSMENT 1. Onychodystrophic nails 1-5 bilateral with hyperkeratosis of nails.  2. Onychomycosis of nail due to dermatophyte bilateral 3. Pain in foot bilateral  PLAN OF CARE 1. Patient evaluated today.  2. Instructed to maintain good pedal hygiene and foot care.  3. Mechanical debridement of nails 1-5 bilaterally performed using a nail nipper. Filed with dremel without incident.  4. Return to clinic as needed.    Edrick Kins, DPM [facility]  Dr. Edrick Kins, [place]                                        [place],  [address]                Office [phone]  Fax [phone]

## 2017-07-20 ENCOUNTER — Ambulatory Visit (HOSPITAL_COMMUNITY)
Admission: RE | Admit: 2017-07-20 | Discharge: 2017-07-20 | Disposition: A | Discharge status: Home / Self Care | Payer: Managed Care, Other (non HMO) | Source: Ambulatory Visit | Attending: Cardiology | Admitting: Cardiology

## 2017-07-20 ENCOUNTER — Encounter (HOSPITAL_COMMUNITY): Payer: Self-pay

## 2017-07-20 ENCOUNTER — Other Ambulatory Visit: Payer: Managed Care, Other (non HMO)

## 2017-07-20 VITALS — BP 152/82 | HR 86 | Wt 374.8 lb

## 2017-07-20 DIAGNOSIS — Z79899 Other long term (current) drug therapy: Secondary | ICD-10-CM | POA: Diagnosis not present

## 2017-07-20 DIAGNOSIS — I483 Typical atrial flutter: Secondary | ICD-10-CM | POA: Diagnosis not present

## 2017-07-20 DIAGNOSIS — I480 Paroxysmal atrial fibrillation: Secondary | ICD-10-CM | POA: Diagnosis not present

## 2017-07-20 DIAGNOSIS — I5032 Chronic diastolic (congestive) heart failure: Secondary | ICD-10-CM | POA: Diagnosis not present

## 2017-07-20 DIAGNOSIS — Z9989 Dependence on other enabling machines and devices: Secondary | ICD-10-CM | POA: Diagnosis not present

## 2017-07-20 DIAGNOSIS — I10 Essential (primary) hypertension: Secondary | ICD-10-CM | POA: Diagnosis not present

## 2017-07-20 DIAGNOSIS — Z87891 Personal history of nicotine dependence: Secondary | ICD-10-CM | POA: Diagnosis not present

## 2017-07-20 DIAGNOSIS — G4733 Obstructive sleep apnea (adult) (pediatric): Secondary | ICD-10-CM | POA: Diagnosis not present

## 2017-07-20 DIAGNOSIS — N183 Chronic kidney disease, stage 3 unspecified: Secondary | ICD-10-CM

## 2017-07-20 DIAGNOSIS — I130 Hypertensive heart and chronic kidney disease with heart failure and stage 1 through stage 4 chronic kidney disease, or unspecified chronic kidney disease: Secondary | ICD-10-CM | POA: Diagnosis present

## 2017-07-20 DIAGNOSIS — J449 Chronic obstructive pulmonary disease, unspecified: Secondary | ICD-10-CM | POA: Insufficient documentation

## 2017-07-20 DIAGNOSIS — Z7901 Long term (current) use of anticoagulants: Secondary | ICD-10-CM | POA: Insufficient documentation

## 2017-07-20 DIAGNOSIS — Z6841 Body Mass Index (BMI) 40.0 and over, adult: Secondary | ICD-10-CM | POA: Diagnosis not present

## 2017-07-20 LAB — CBC
HCT: 40.3 % (ref 39.0–52.0)
Hemoglobin: 13.1 g/dL (ref 13.0–17.0)
MCH: 28.5 pg (ref 26.0–34.0)
MCHC: 32.5 g/dL (ref 30.0–36.0)
MCV: 87.8 fL (ref 78.0–100.0)
PLATELETS: 158 10*3/uL (ref 150–400)
RBC: 4.59 MIL/uL (ref 4.22–5.81)
RDW: 15.9 % — ABNORMAL HIGH (ref 11.5–15.5)
WBC: 3.4 10*3/uL — ABNORMAL LOW (ref 4.0–10.5)

## 2017-07-20 LAB — BASIC METABOLIC PANEL
Anion gap: 9 (ref 5–15)
BUN: 42 mg/dL — ABNORMAL HIGH (ref 6–20)
CALCIUM: 9.1 mg/dL (ref 8.9–10.3)
CO2: 30 mmol/L (ref 22–32)
CREATININE: 2.73 mg/dL — AB (ref 0.61–1.24)
Chloride: 101 mmol/L (ref 101–111)
GFR calc non Af Amer: 25 mL/min — ABNORMAL LOW (ref >60)
GFR, EST AFRICAN AMERICAN: 29 mL/min — AB (ref >60)
GLUCOSE: 106 mg/dL — AB (ref 65–99)
Potassium: 4.1 mmol/L (ref 3.5–5.1)
Sodium: 140 mmol/L (ref 135–145)

## 2017-07-20 LAB — MAGNESIUM: Magnesium: 2.2 mg/dL (ref 1.7–2.4)

## 2017-07-20 LAB — PROTIME-INR
INR: 2.03
PROTHROMBIN TIME: 22.8 s — AB (ref 11.4–15.2)

## 2017-07-20 NOTE — Patient Instructions (Signed)
Labs today (will call for abnormal results, otherwise no news is good news)  Follow up in 4 weeks 

## 2017-07-20 NOTE — Progress Notes (Signed)
Advanced Heart Failure Clinic Note   Referring Physician: [name] Primary Care: Silvio Pate Primary Cardiologist: [name]   HPI:  Phillip [name] is a 57 y.o. male hx of PAF, remote PE, hx of DVT, OSA, morbid obesity and HTN also with combined chronic systolic and diastolic HF. With EF 40-45%. He has been wearing CPAP. With CHA2DS2VASc score 2 and pt on xarelto.   Echo 12/21/16 with EF 55-60% G2DD, mild aortic regurgitation. Last nuc 2008 no ischemia, no hx of cath. Referred by Dr. [name] for assistance with HF management.   He has had multiple admissions to the hospital for volume overload/acute on chronic diastolic heart failure. Last admit in 12/18 d/c weight was 368. Lowest weight in last few month recorded 353.   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   He presents today for follow up. Saw Dr. Rayann Heman 07/01/17. Plan for AFL ablation 07/22/17. Is feeling OK. Did take extra diuretics last week for swelling and mildly worse SOB.  He denies palpitations or CP. No lightheadedness or dizziness. Did not take meds this am.  He has been watching his fluid and salt intake. Using CPAP every night. Wants to lost weight.   Review of systems complete and found to be negative unless listed in HPI.    Past Medical History:  Diagnosis Date  . CHF (congestive heart failure) ([place]) 11/15   EF 40-45%, improved with follow-up  . COPD (chronic obstructive pulmonary disease) ([place])   . Gout   . History of DVT (deep vein thrombosis)   . Hypertension   . Hypertensive cardiovascular disease   . Kidney dysfunction   . Morbid obesity ([place])   . OSA on CPAP   . Paroxysmal atrial fibrillation ([facility])   . PNA (pneumonia)   . Pulmonary embolism ([place]) 2010   left leg DVT   Current Outpatient Medications  Medication Sig Dispense Refill  . albuterol (PROVENTIL HFA;VENTOLIN HFA) 108 (90 Base) MCG/ACT inhaler Inhale 2 puffs into the lungs every 4 (four) hours as needed for wheezing or shortness of breath.  1 Inhaler 0  . albuterol (PROVENTIL) (2.5 MG/3ML) 0.083% nebulizer solution Take 3 mLs (2.5 mg total) by nebulization every 6 (six) hours as needed for wheezing or shortness of breath. 360 mL 11  . allopurinol (ZYLOPRIM) 300 MG tablet Take 1 tablet (300 mg total) daily by mouth. 90 tablet 3  . budesonide-formoterol (SYMBICORT) 80-4.5 MCG/ACT inhaler Inhale 2 puffs 2 (two) times daily into the lungs. 3 Inhaler 3  . carvedilol (COREG) 6.25 MG tablet Take 1 tablet (6.25 mg total) by mouth 2 (two) times daily with a meal. 60 tablet 11  . cloNIDine (CATAPRES) 0.1 MG tablet TAKE 1 TABLET BY MOUTH 3 TIMES A DAY 90 tablet 1  . colchicine 0.6 MG tablet Take 1 tablet (0.6 mg total) by mouth daily. (Patient taking differently: Take 0.6 mg by mouth daily as needed (gout). ) 30 tablet 0  . hydrALAZINE (APRESOLINE) 50 MG tablet Take 1 tablet (50 mg total) by mouth 3 (three) times daily. 90 tablet 11  . montelukast (SINGULAIR) 10 MG tablet Take 1 tablet (10 mg total) by mouth at bedtime. 30 tablet 11  . naproxen sodium (ALEVE) 220 MG tablet Take 440 mg by mouth daily as needed (pain).    . OXYGEN Inhale 2 L into the lungs daily. 2lpm when needed per pt     . oxymetazoline (AFRIN NASAL SPRAY) 0.05 % nasal spray Place 1 spray into both  nostrils 2 (two) times daily as needed for congestion.    . potassium chloride SA (K-DUR,KLOR-CON) 20 MEQ tablet Take 1.5 tablets (30 mEq total) by mouth daily. 90 tablet 3  . spironolactone (ALDACTONE) 25 MG tablet Take 1 tablet (25 mg total) by mouth daily. 30 tablet 3  . torsemide (DEMADEX) 20 MG tablet Take 3 tablets (60 mg total) by mouth 2 (two) times daily. 540 tablet 3  . UNABLE TO FIND Med Name: CPAP with sleep    . XARELTO 20 MG TABS tablet TAKE 1 TABLET BY MOUTH DAILY WITH SUPPER 30 tablet 5   No current facility-administered medications for this visit.    No Known Allergies  Social History   Socioeconomic History  . Marital status: Divorced    Spouse name: Not on  file  . Number of children: 2  . Years of education: Not on file  . Highest education level: Not on file  Occupational History  . 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 Occupation: Airline pilot    Comment: Disabled  Social Needs  . Financial resource strain: Not on file  . Food insecurity:    Worry: Not on file    Inability: Not on file  . Transportation needs:    Medical: Not on file    Non-medical: Not on file  Tobacco Use  . Smoking status: Former Smoker    Packs/day: 0.25    Years: 1.00    Pack years: 0.25    Types: Cigarettes    Last attempt to quit: 07/23/1989    Years since quitting: 28.0  . Smokeless tobacco: Never Used  Substance and Sexual Activity  . Alcohol use: No    Alcohol/week: 0.0 oz  . Drug use: No  . Sexual activity: Not Currently  Lifestyle  . Physical activity:    Days per week: Not on file    Minutes per session: Not on file  . Stress: Not on file  Relationships  . Social connections:    Talks on phone: Not on file    Gets together: Not on file    Attends religious service: Not on file    Active member of club or organization: Not on file    Attends meetings of clubs or organizations: Not on file    Relationship status: Not on file  . Intimate partner violence:    Fear of current or ex partner: Not on file    Emotionally abused: Not on file    Physically abused: Not on file    Forced sexual activity: Not on file  Other Topics Concern  . Not on file  Social History Narrative   Pt lives in [place], alone.   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   Has 2 sons in [place]      No living will   Would want sons to make decisions for him   Would accept resuscitation    Family History  Problem Relation Age of Onset  . Hypertension Mother   . Diabetes Father   . CAD Father   . Stomach cancer Father   . Clotting disorder Father   . Heart disease Father        transplant  . Allergies Brother    Vitals:    07/20/17 1030  BP: (!) 152/82  Pulse: 86  SpO2: 97%  Weight: (!) 374 lb 12.8 oz (170 kg)   Wt Readings from Last 3 Encounters:  07/20/17 (!) 374  lb 12.8 oz (170 kg)  07/01/17 (!) 374 lb (169.6 kg)  06/22/17 (!) 375 lb (170.1 kg)   PHYSICAL EXAM: General: Well appearing. No resp difficulty. Morbidly obese.  HEENT: Normal Neck: Supple. JVP difficult to assess, but does not appear elevated. Carotids 2+ bilat; no bruits. No thyromegaly or nodule noted. Cor: PMI nondisplaced. RRR, No M/G/R noted Lungs: CTAB, normal effort. Abdomen: Markedly obese, soft, non-tender, non-distended, no HSM. No bruits or masses. +BS  Extremities: No cyanosis, clubbing, or rash. Trace ankle edema.  Neuro: Alert & orientedx3, cranial nerves grossly intact. moves all 4 extremities w/o difficulty. Affect pleasant   ASSESSMENT & PLAN:  1. Chronic diastolic HF - Echo 05/5571 LVEF 55-60%, Grade 2 DD.  - Volume status stable on exam.  - NYHA  II-early 3 symptoms.  - Continue torsemide 60 mg BID. Can take 80 BID as needed.  - Continue spiro 25 mg daily.  - Reinforced fluid restriction to < 2 L daily, sodium restriction to less than 2000 mg daily, and the importance of daily weights.    2. PAF / Typical A-flutter, paroxysmal - Plan for AFL ablation 07/22/17 with Dr. Rayann Heman.  - Continue Xarelto 20 mg daily. Appropriate dose per Pharm-D.   3. Hypertension - Somewhat elevated this am. Forgot his meds. Has been stable at home and other appointments. Continue same meds for now with upcoming procedure.  - Stable at OV 07/01/17.  - Continue hydralazine 50 TID - Continue coreg to 6.25 mg BID.  - Continue spiro 25 mg daily.  - Reinforced fluid restriction to < 2 L daily, sodium restriction to less than 2000 mg daily, and the importance of daily weights.    6. Morbid obesity - Body mass index is 55.35 kg/m.  - Needs weight loss. Interested in bariatric surgery "eventually" but wants to continue to work on his own for  now.  - No change to current plan.    5. CKD IV - Pre-procedure labs today.   6. OSA on CPAP - Encouraged nightly use.   7. Screening - His EF was normal in September.  - He is OK for colonoscopy with low/moderate risk for peri-operative complications due to his body habitus and OSA.   Will see back s/p AFL ablation. RTC 4 weeks. Labs today.   Shirley Friar, PA-C  9:33 AM   Greater than 50% of the 25 minute visit was spent in counseling/coordination of care regarding disease state education, salt/fluid restriction, sliding scale diuretics, and medication compliance.

## 2017-07-21 ENCOUNTER — Encounter (HOSPITAL_COMMUNITY): Payer: Self-pay | Admitting: Certified Registered Nurse Anesthetist

## 2017-07-22 ENCOUNTER — Ambulatory Visit (HOSPITAL_COMMUNITY): Payer: Managed Care, Other (non HMO) | Admitting: Certified Registered Nurse Anesthetist

## 2017-07-22 ENCOUNTER — Ambulatory Visit (HOSPITAL_COMMUNITY)
Admission: RE | Admit: 2017-07-22 | Discharge: 2017-07-22 | Disposition: A | Discharge status: Home / Self Care | Payer: Managed Care, Other (non HMO) | Source: Ambulatory Visit | Attending: Internal Medicine | Admitting: Internal Medicine

## 2017-07-22 ENCOUNTER — Encounter (HOSPITAL_COMMUNITY)
Admission: RE | Disposition: A | Discharge status: Home / Self Care | Payer: Self-pay | Source: Ambulatory Visit | Attending: Internal Medicine

## 2017-07-22 DIAGNOSIS — I509 Heart failure, unspecified: Secondary | ICD-10-CM | POA: Insufficient documentation

## 2017-07-22 DIAGNOSIS — I471 Supraventricular tachycardia: Secondary | ICD-10-CM | POA: Insufficient documentation

## 2017-07-22 DIAGNOSIS — Z6841 Body Mass Index (BMI) 40.0 and over, adult: Secondary | ICD-10-CM | POA: Insufficient documentation

## 2017-07-22 DIAGNOSIS — I428 Other cardiomyopathies: Secondary | ICD-10-CM | POA: Diagnosis not present

## 2017-07-22 DIAGNOSIS — M109 Gout, unspecified: Secondary | ICD-10-CM | POA: Diagnosis not present

## 2017-07-22 DIAGNOSIS — N184 Chronic kidney disease, stage 4 (severe): Secondary | ICD-10-CM | POA: Diagnosis not present

## 2017-07-22 DIAGNOSIS — Z8249 Family history of ischemic heart disease and other diseases of the circulatory system: Secondary | ICD-10-CM | POA: Insufficient documentation

## 2017-07-22 DIAGNOSIS — J449 Chronic obstructive pulmonary disease, unspecified: Secondary | ICD-10-CM | POA: Diagnosis not present

## 2017-07-22 DIAGNOSIS — I130 Hypertensive heart and chronic kidney disease with heart failure and stage 1 through stage 4 chronic kidney disease, or unspecified chronic kidney disease: Secondary | ICD-10-CM | POA: Insufficient documentation

## 2017-07-22 DIAGNOSIS — Z87891 Personal history of nicotine dependence: Secondary | ICD-10-CM | POA: Diagnosis not present

## 2017-07-22 DIAGNOSIS — Z7901 Long term (current) use of anticoagulants: Secondary | ICD-10-CM | POA: Diagnosis not present

## 2017-07-22 DIAGNOSIS — Z7951 Long term (current) use of inhaled steroids: Secondary | ICD-10-CM | POA: Diagnosis not present

## 2017-07-22 DIAGNOSIS — I4892 Unspecified atrial flutter: Secondary | ICD-10-CM | POA: Insufficient documentation

## 2017-07-22 DIAGNOSIS — Z86711 Personal history of pulmonary embolism: Secondary | ICD-10-CM | POA: Diagnosis not present

## 2017-07-22 DIAGNOSIS — Z86718 Personal history of other venous thrombosis and embolism: Secondary | ICD-10-CM | POA: Insufficient documentation

## 2017-07-22 DIAGNOSIS — I4891 Unspecified atrial fibrillation: Secondary | ICD-10-CM | POA: Diagnosis present

## 2017-07-22 HISTORY — PX: A-FLUTTER ABLATION: EP1230

## 2017-07-22 SURGERY — A-FLUTTER ABLATION
Anesthesia: Monitor Anesthesia Care

## 2017-07-22 MED ORDER — POTASSIUM CHLORIDE CRYS ER 20 MEQ PO TBCR: 30.0000 meq | EXTENDED_RELEASE_TABLET | Freq: Every day | ORAL | Status: DC

## 2017-07-22 MED ORDER — FENTANYL CITRATE (PF) 100 MCG/2ML IJ SOLN
INTRAMUSCULAR | Status: DC | PRN
  Administered 2017-07-22: 100 ug via INTRAVENOUS

## 2017-07-22 MED ORDER — ACETAMINOPHEN 325 MG PO TABS: 650.0000 mg | ORAL_TABLET | ORAL | Status: DC | PRN

## 2017-07-22 MED ORDER — SODIUM CHLORIDE 0.9 % IV SOLN: 250.0000 mL | INTRAVENOUS | Status: DC | PRN

## 2017-07-22 MED ORDER — CARVEDILOL 6.25 MG PO TABS: 6.2500 mg | ORAL_TABLET | Freq: Two times a day (BID) | ORAL | Status: DC

## 2017-07-22 MED ORDER — SUGAMMADEX SODIUM 500 MG/5ML IV SOLN
INTRAVENOUS | Status: DC | PRN
  Administered 2017-07-22: 350 mg via INTRAVENOUS

## 2017-07-22 MED ORDER — SODIUM CHLORIDE 0.9 % IV SOLN
INTRAVENOUS | Status: DC
  Administered 2017-07-22: 07:00:00 via INTRAVENOUS

## 2017-07-22 MED ORDER — HYDROCODONE-ACETAMINOPHEN 5-325 MG PO TABS
1.0000 | ORAL_TABLET | ORAL | Status: DC | PRN
Start: 2017-07-22 — End: 2017-07-22

## 2017-07-22 MED ORDER — SODIUM CHLORIDE 0.9% FLUSH: 3.0000 mL | Freq: Two times a day (BID) | INTRAVENOUS | Status: DC

## 2017-07-22 MED ORDER — BUPIVACAINE HCL (PF) 0.25 % IJ SOLN
INTRAMUSCULAR | Status: DC | PRN
  Administered 2017-07-22: 30 mL

## 2017-07-22 MED ORDER — BUPIVACAINE HCL (PF) 0.25 % IJ SOLN
INTRAMUSCULAR | Status: AC
  Filled 2017-07-22: qty 30

## 2017-07-22 MED ORDER — ROCURONIUM BROMIDE 100 MG/10ML IV SOLN
INTRAVENOUS | Status: DC | PRN
  Administered 2017-07-22: 20 mg via INTRAVENOUS
  Administered 2017-07-22: 50 mg via INTRAVENOUS

## 2017-07-22 MED ORDER — CLONIDINE HCL 0.1 MG PO TABS: 0.1000 mg | ORAL_TABLET | Freq: Three times a day (TID) | ORAL | Status: DC

## 2017-07-22 MED ORDER — SODIUM CHLORIDE 0.9% FLUSH: 3.0000 mL | INTRAVENOUS | Status: DC | PRN

## 2017-07-22 MED ORDER — MIDAZOLAM HCL 5 MG/5ML IJ SOLN
INTRAMUSCULAR | Status: DC | PRN
  Administered 2017-07-22 (×2): 1 mg via INTRAVENOUS

## 2017-07-22 MED ORDER — LIDOCAINE HCL (CARDIAC) PF 100 MG/5ML IV SOSY
PREFILLED_SYRINGE | INTRAVENOUS | Status: DC | PRN
  Administered 2017-07-22: 40 mg via INTRAVENOUS

## 2017-07-22 MED ORDER — ONDANSETRON HCL 4 MG/2ML IJ SOLN: 4.0000 mg | Freq: Four times a day (QID) | INTRAMUSCULAR | Status: DC | PRN

## 2017-07-22 MED ORDER — RIVAROXABAN 20 MG PO TABS: 20.0000 mg | ORAL_TABLET | Freq: Every day | ORAL | Status: DC

## 2017-07-22 MED ORDER — PHENYLEPHRINE HCL 10 MG/ML IJ SOLN
INTRAVENOUS | Status: DC | PRN
  Administered 2017-07-22: 20 ug/min via INTRAVENOUS

## 2017-07-22 MED ORDER — ALLOPURINOL 300 MG PO TABS: 300.0000 mg | ORAL_TABLET | Freq: Every day | ORAL | Status: DC

## 2017-07-22 MED ORDER — PROPOFOL 10 MG/ML IV BOLUS
INTRAVENOUS | Status: DC | PRN
  Administered 2017-07-22: 170 mg via INTRAVENOUS

## 2017-07-22 MED ORDER — ALBUTEROL SULFATE (2.5 MG/3ML) 0.083% IN NEBU: 2.5000 mg | INHALATION_SOLUTION | Freq: Four times a day (QID) | RESPIRATORY_TRACT | Status: DC | PRN

## 2017-07-22 MED ORDER — HYDRALAZINE HCL 50 MG PO TABS: 50.0000 mg | ORAL_TABLET | Freq: Three times a day (TID) | ORAL | Status: DC

## 2017-07-22 MED ORDER — SPIRONOLACTONE 25 MG PO TABS: 25.0000 mg | ORAL_TABLET | Freq: Every day | ORAL | Status: DC

## 2017-07-22 MED ORDER — MONTELUKAST SODIUM 10 MG PO TABS: 10.0000 mg | ORAL_TABLET | Freq: Every day | ORAL | Status: DC

## 2017-07-22 MED ORDER — ALBUTEROL SULFATE HFA 108 (90 BASE) MCG/ACT IN AERS: 2.0000 | INHALATION_SPRAY | RESPIRATORY_TRACT | Status: DC | PRN

## 2017-07-22 MED ORDER — ONDANSETRON HCL 4 MG/2ML IJ SOLN
INTRAMUSCULAR | Status: DC | PRN
  Administered 2017-07-22: 4 mg via INTRAVENOUS

## 2017-07-22 MED ORDER — HYDROCODONE-ACETAMINOPHEN 5-325 MG PO TABS: 1.0000 | ORAL_TABLET | ORAL | Status: DC | PRN

## 2017-07-22 MED ORDER — DEXAMETHASONE SODIUM PHOSPHATE 10 MG/ML IJ SOLN
INTRAMUSCULAR | Status: DC | PRN
  Administered 2017-07-22: 10 mg via INTRAVENOUS

## 2017-07-22 MED ORDER — TORSEMIDE 20 MG PO TABS: 60.0000 mg | ORAL_TABLET | Freq: Two times a day (BID) | ORAL | Status: DC

## 2017-07-22 MED ORDER — PHENYLEPHRINE 40 MCG/ML (10ML) SYRINGE FOR IV PUSH (FOR BLOOD PRESSURE SUPPORT)
PREFILLED_SYRINGE | INTRAVENOUS | Status: DC | PRN
  Administered 2017-07-22: 40 ug via INTRAVENOUS
  Administered 2017-07-22 (×2): 80 ug via INTRAVENOUS

## 2017-07-22 SURGICAL SUPPLY — 9 items
BLANKET WARM UNDERBOD FULL ACC (MISCELLANEOUS) ×3 IMPLANT
CATH EZ STEER NAV 8MM F-J CUR (ABLATOR) ×3 IMPLANT
CATH WEBSTER BI DIR CS D-F CRV (CATHETERS) ×3 IMPLANT
PACK EP LATEX FREE (CUSTOM PROCEDURE TRAY) ×2
PACK EP LF (CUSTOM PROCEDURE TRAY) ×1 IMPLANT
PAD DEFIB LIFELINK (PAD) ×3 IMPLANT
PATCH CARTO3 (PAD) ×3 IMPLANT
SHEATH AVANTI 11CM 7FR (SHEATH) ×3 IMPLANT
SHEATH AVANTI 11CM 8FR (SHEATH) ×3 IMPLANT

## 2017-07-22 NOTE — Anesthesia Preprocedure Evaluation (Signed)
Anesthesia Evaluation  Patient identified by MRN, date of birth, ID band Patient awake    Reviewed: Allergy & Precautions, NPO status , Patient's Chart, lab work & pertinent test results  Airway Mallampati: II  TM Distance: >3 FB Neck ROM: Full    Dental  (+) Teeth Intact, Dental Advisory Given   Pulmonary former smoker,    breath sounds clear to auscultation + decreased breath sounds      Cardiovascular hypertension,  Rhythm:Irregular Rate:Abnormal     Neuro/Psych    GI/Hepatic   Endo/Other    Renal/GU      Musculoskeletal   Abdominal (+) + obese,   Peds  Hematology   Anesthesia Other Findings   Reproductive/Obstetrics                             Anesthesia Physical Anesthesia Plan  ASA: III  Anesthesia Plan: General   Post-op Pain Management:    Induction: Intravenous  PONV Risk Score and Plan: Ondansetron  Airway Management Planned: Oral ETT  Additional Equipment:   Intra-op Plan:   Post-operative Plan: Extubation in OR  Informed Consent: I have reviewed the patients History and Physical, chart, labs and discussed the procedure including the risks, benefits and alternatives for the proposed anesthesia with the patient or authorized representative who has indicated his/her understanding and acceptance.   Dental advisory given  Plan Discussed with: CRNA and Anesthesiologist  Anesthesia Plan Comments:         Anesthesia Quick Evaluation

## 2017-07-22 NOTE — Discharge Instructions (Signed)
No driving for 4 days. No lifting over 5 lbs for 1 week. No sexual activity for 1 week. You may return to work in 1 week. Keep procedure site clean & dry. If you notice increased pain, swelling, bleeding or pus, call/return!  You may shower, but no soaking baths/hot tubs/pools for 1 week.  Restart xarelto tonight 07/22/17  Cardiac Ablation, Care After This sheet gives you information about how to care for yourself after your procedure. Your health care provider may also give you more specific instructions. If you have problems or questions, contact your health care provider. What can I expect after the procedure? After the procedure, it is common to have:  Bruising around your puncture site.  Tenderness around your puncture site.  Skipped heartbeats.  Tiredness (fatigue).  Follow these instructions at home: Puncture site care  Follow instructions from your health care provider about how to take care of your puncture site. Make sure you: ? Wash your hands with soap and water before you change your bandage (dressing). If soap and water are not available, use hand sanitizer. ? Change your dressing as told by your health care provider. ? Leave stitches (sutures), skin glue, or adhesive strips in place. These skin closures may need to stay in place for up to 2 weeks. If adhesive strip edges start to loosen and curl up, you may trim the loose edges. Do not remove adhesive strips completely unless your health care provider tells you to do that.  Check your puncture site every day for signs of infection. Check for: ? Redness, swelling, or pain. ? Fluid or blood. If your puncture site starts to bleed, lie down on your back, apply firm pressure to the area, and contact your health care provider. ? Warmth. ? Pus or a bad smell. Driving  Ask your health care provider when it is safe for you to drive again after the procedure.  Do not drive or use heavy machinery while taking prescription pain  medicine.  Do not drive for 24 hours if you were given a medicine to help you relax (sedative) during your procedure. Activity  Avoid activities that take a lot of effort for at least 3 days after your procedure.  Do not lift anything that is heavier than 10 lb (4.5 kg), or the limit that you are told, until your health care provider says that it is safe.  Return to your normal activities as told by your health care provider. Ask your health care provider what activities are safe for you. General instructions  Take over-the-counter and prescription medicines only as told by your health care provider.  Do not use any products that contain nicotine or tobacco, such as cigarettes and e-cigarettes. If you need help quitting, ask your health care provider.  Do not take baths, swim, or use a hot tub until your health care provider approves.  Do not drink alcohol for 24 hours after your procedure.  Keep all follow-up visits as told by your health care provider. This is important. Contact a health care provider if:  You have redness, mild swelling, or pain around your puncture site.  You have fluid or blood coming from your puncture site that stops after applying firm pressure to the area.  Your puncture site feels warm to the touch.  You have pus or a bad smell coming from your puncture site.  You have a fever.  You have chest pain or discomfort that spreads to your neck, jaw, or arm.  You are sweating a lot.  You feel nauseous.  You have a fast or irregular heartbeat.  You have shortness of breath.  You are dizzy or light-headed and feel the need to lie down.  You have pain or numbness in the arm or leg closest to your puncture site. Get help right away if:  Your puncture site suddenly swells.  Your puncture site is bleeding and the bleeding does not stop after applying firm pressure to the area. These symptoms may represent a serious problem that is an emergency. Do not  wait to see if the symptoms will go away. Get medical help right away. Call your local emergency services (911 in the [place] Do not drive yourself to the hospital. Summary  After the procedure, it is normal to have bruising and tenderness at the puncture site in your groin, neck, or forearm.  Check your puncture site every day for signs of infection.  Get help right away if your puncture site is bleeding and the bleeding does not stop after applying firm pressure to the area. This is a medical emergency. This information is not intended to replace advice given to you by your health care provider. Make sure you discuss any questions you have with your health care provider. Document Released: 06/19/2016 Document Revised: 06/19/2016 Document Reviewed: 06/19/2016 Elsevier Interactive Patient Education  2018 [employer].

## 2017-07-22 NOTE — Anesthesia Postprocedure Evaluation (Signed)
Anesthesia Post Note  Patient: Phillip [name]  Procedure(s) Performed: A-FLUTTER ABLATION (N/A )     Patient location during evaluation: Cath Lab Anesthesia Type: General Level of consciousness: awake and alert, awake and oriented Pain management: pain level controlled Vital Signs Assessment: post-procedure vital signs reviewed and stable Respiratory status: spontaneous breathing, nonlabored ventilation, respiratory function stable and patient connected to nasal cannula oxygen Cardiovascular status: blood pressure returned to baseline and stable Postop Assessment: no apparent nausea or vomiting Anesthetic complications: no    Last Vitals:  Vitals:   07/22/17 1234 07/22/17 1335  BP: (!) 157/88 (!) 151/63  Pulse:    Resp:    Temp:    SpO2:      Last Pain:  Vitals:   07/22/17 1346  TempSrc:   PainSc: 0-No pain                 Joaquin Knebel COKER

## 2017-07-22 NOTE — Anesthesia Procedure Notes (Signed)
Procedure Name: Intubation Date/Time: 07/22/2017 9:02 AM Performed by: Candis Shine, CRNA Pre-anesthesia Checklist: Patient identified, Emergency Drugs available, Suction available and Patient being monitored Patient Re-evaluated:Patient Re-evaluated prior to induction Oxygen Delivery Method: Circle System Utilized Preoxygenation: Pre-oxygenation with 100% oxygen Induction Type: IV induction Ventilation: Mask ventilation without difficulty Laryngoscope Size: Mac and 4 Grade View: Grade I Tube type: Oral Tube size: 7.5 mm Number of attempts: 1 Airway Equipment and Method: Stylet Placement Confirmation: ETT inserted through vocal cords under direct vision,  positive ETCO2 and breath sounds checked- equal and bilateral Secured at: 22 cm Tube secured with: Tape Dental Injury: Teeth and Oropharynx as per pre-operative assessment

## 2017-07-22 NOTE — Interval H&P Note (Signed)
History and Physical Interval Note:  07/22/2017 8:26 AM  Phillip [name]  has presented today for surgery, with the diagnosis of atrial flutter  The various methods of treatment have been discussed with the patient and family. After consideration of risks, benefits and other options for treatment, the patient has consented to  Procedure(s): A-FLUTTER ABLATION (N/A) as a surgical intervention .  The patient's history has been reviewed, patient examined, no change in status, stable for surgery.  I have reviewed the patient's chart and labs.  Questions were answered to the patient's satisfaction.    He reports compliance with xarelto without interruption.  Thompson Grayer

## 2017-07-22 NOTE — Transfer of Care (Signed)
Immediate Anesthesia Transfer of Care Note  Patient: Phillip [name]  Procedure(s) Performed: A-FLUTTER ABLATION (N/A )  Patient Location: Cath Lab  Anesthesia Type:General  Level of Consciousness: awake, alert  and oriented  Airway & Oxygen Therapy: Patient Spontanous Breathing and Patient connected to nasal cannula oxygen  Post-op Assessment: Report given to RN and Post -op Vital signs reviewed and stable  Post vital signs: Reviewed and stable  Last Vitals:  Vitals Value Taken Time  BP 137/75 07/22/2017 10:38 AM  Temp 36.4 C 07/22/2017 10:35 AM  Pulse 72 07/22/2017 10:39 AM  Resp 14 07/22/2017 10:39 AM  SpO2 96 % 07/22/2017 10:39 AM  Vitals shown include unvalidated device data.  Last Pain:  Vitals:   07/22/17 1035  TempSrc: Temporal  PainSc: 0-No pain      Patients Stated Pain Goal: 2 (80/01/23 9359)  Complications: No apparent anesthesia complications

## 2017-07-23 ENCOUNTER — Encounter (HOSPITAL_COMMUNITY): Payer: Self-pay | Admitting: Internal Medicine

## 2017-07-25 ENCOUNTER — Other Ambulatory Visit: Payer: Self-pay | Admitting: Interventional Cardiology

## 2017-07-27 NOTE — Telephone Encounter (Signed)
Xarelto 20mg  refill request received; pt is 57 yrs old, wt-169.6kg, Crea-2.73 on 07/20/17, last seen by HF Clinic on 07/20/17, CrCl-73.34ml/min; will send refill to requested pharmacy.

## 2017-07-28 ENCOUNTER — Ambulatory Visit: Payer: Managed Care, Other (non HMO) | Admitting: Internal Medicine

## 2017-07-28 ENCOUNTER — Telehealth: Payer: Self-pay | Admitting: Internal Medicine

## 2017-07-28 ENCOUNTER — Other Ambulatory Visit: Payer: Self-pay

## 2017-07-28 MED ORDER — RIVAROXABAN 20 MG PO TABS: 20.0000 mg | ORAL_TABLET | Freq: Every day | ORAL | 3 refills | Status: DC

## 2017-07-28 MED ORDER — RIVAROXABAN 20 MG PO TABS: 20.0000 mg | ORAL_TABLET | Freq: Every day | ORAL | 2 refills | Status: DC

## 2017-07-28 NOTE — Telephone Encounter (Signed)
Pt last saw Dr Rayann Heman 07/01/17, last labs 07/20/17 Creat 2.73, age 57, weight 170kg, CrCl 73.51, based on CrCl pt is on appropriate dosage of Xarelto 20mg  QD.  Will refill rx.

## 2017-07-28 NOTE — Telephone Encounter (Signed)
The pt states that he has transferred his Xarelto RX to [facility] because he can get a 90 day supply for $100 and it was $250 at [facility]. He is requesting samples of Xarelto as he was advised that it will take up to 10 days to get his Xarelto from [facility]. He is advised that I have left him 2 bottles of Xarelto samples in the front office. He states that he will pick up today.

## 2017-07-28 NOTE — Telephone Encounter (Signed)
New Message   Patient calling the office for samples of medication:   1.  What medication and dosage are you requesting samples for? XARELTO 20 MG TABS tablet  2.  Are you currently out of this medication? yes

## 2017-08-03 ENCOUNTER — Ambulatory Visit: Payer: Managed Care, Other (non HMO) | Admitting: Internal Medicine

## 2017-08-05 ENCOUNTER — Encounter: Payer: Self-pay | Admitting: Internal Medicine

## 2017-08-05 ENCOUNTER — Ambulatory Visit: Payer: Managed Care, Other (non HMO) | Admitting: Internal Medicine

## 2017-08-05 VITALS — BP 112/74 | HR 74 | Temp 98.2°F | Ht 69.0 in | Wt 381.0 lb

## 2017-08-05 DIAGNOSIS — I82403 Acute embolism and thrombosis of unspecified deep veins of lower extremity, bilateral: Secondary | ICD-10-CM | POA: Diagnosis not present

## 2017-08-05 DIAGNOSIS — I5032 Chronic diastolic (congestive) heart failure: Secondary | ICD-10-CM | POA: Diagnosis not present

## 2017-08-05 DIAGNOSIS — N184 Chronic kidney disease, stage 4 (severe): Secondary | ICD-10-CM

## 2017-08-05 DIAGNOSIS — J449 Chronic obstructive pulmonary disease, unspecified: Secondary | ICD-10-CM | POA: Diagnosis not present

## 2017-08-05 NOTE — Assessment & Plan Note (Signed)
Compensated on his regimen Keeps up with Dr Melvyn Novas

## 2017-08-05 NOTE — Assessment & Plan Note (Signed)
No evidence of recurrence Continues on xarelto

## 2017-08-05 NOTE — Assessment & Plan Note (Signed)
Despite weight gain--seems compensated No evidence of fluid overload Discussed getting back to healthy eating

## 2017-08-05 NOTE — Progress Notes (Signed)
Subjective:    Patient ID: Phillip [name], male    DOB: [birth year], 57 y.o.   MRN: [id number]  HPI Here for follow up of CHF and other chronic health conditions  Recent ablation for atrial flutter by Dr Denita Lung well Still on the xarelto Does feel the "scar" at times from the ablation  Weight is back up He thinks it is from eating more--ready to go back on regimen No edema Sleeps in bed with 2 pillows---no PND (wears the CPAP nightly) Not exercising since the ablation  Keeps up with Dr Melvyn Novas Breathing okay No regular cough or wheezing  Has not seen nephrologist Reviewed labs  Current Outpatient Medications on File Prior to Visit  Medication Sig Dispense Refill  . albuterol (PROVENTIL HFA;VENTOLIN HFA) 108 (90 Base) MCG/ACT inhaler Inhale 2 puffs into the lungs every 4 (four) hours as needed for wheezing or shortness of breath. 1 Inhaler 0  . albuterol (PROVENTIL) (2.5 MG/3ML) 0.083% nebulizer solution Take 3 mLs (2.5 mg total) by nebulization every 6 (six) hours as needed for wheezing or shortness of breath. 360 mL 11  . allopurinol (ZYLOPRIM) 300 MG tablet Take 1 tablet (300 mg total) daily by mouth. 90 tablet 3  . budesonide-formoterol (SYMBICORT) 80-4.5 MCG/ACT inhaler Inhale 2 puffs 2 (two) times daily into the lungs. 3 Inhaler 3  . carvedilol (COREG) 6.25 MG tablet Take 1 tablet (6.25 mg total) by mouth 2 (two) times daily with a meal. 60 tablet 11  . cloNIDine (CATAPRES) 0.1 MG tablet TAKE 1 TABLET BY MOUTH 3 TIMES A DAY 90 tablet 1  . colchicine 0.6 MG tablet Take 1 tablet (0.6 mg total) by mouth daily. (Patient taking differently: Take 0.6 mg by mouth daily as needed (gout). ) 30 tablet 0  . hydrALAZINE (APRESOLINE) 50 MG tablet Take 1 tablet (50 mg total) by mouth 3 (three) times daily. 90 tablet 11  . montelukast (SINGULAIR) 10 MG tablet Take 1 tablet (10 mg total) by mouth at bedtime. 30 tablet 11  . naproxen sodium (ALEVE) 220 MG tablet Take 440 mg by mouth daily as  needed (pain).    . OXYGEN Inhale 2 L into the lungs daily. 2lpm when needed per pt     . oxymetazoline (AFRIN NASAL SPRAY) 0.05 % nasal spray Place 1 spray into both nostrils 2 (two) times daily as needed for congestion.    . potassium chloride SA (K-DUR,KLOR-CON) 20 MEQ tablet Take 1.5 tablets (30 mEq total) by mouth daily. 90 tablet 3  . rivaroxaban (XARELTO) 20 MG TABS tablet Take 1 tablet (20 mg total) by mouth daily with supper. 90 tablet 2  . spironolactone (ALDACTONE) 25 MG tablet Take 1 tablet (25 mg total) by mouth daily. 30 tablet 3  . torsemide (DEMADEX) 20 MG tablet Take 3 tablets (60 mg total) by mouth 2 (two) times daily. 540 tablet 3  . UNABLE TO FIND Med Name: CPAP with sleep     No current facility-administered medications on file prior to visit.     No Known Allergies  Past Medical History:  Diagnosis Date  . CHF (congestive heart failure) ([place]) 11/15   EF 40-45%, improved with follow-up  . COPD (chronic obstructive pulmonary disease) ([place])   . Gout   . History of DVT (deep vein thrombosis)   . Hypertension   . Hypertensive cardiovascular disease   . Kidney dysfunction   . Morbid obesity ([place])   . OSA on CPAP   . Paroxysmal atrial  fibrillation ([place])   . PNA (pneumonia)   . Pulmonary embolism ([place]) 2010   left leg DVT    Past Surgical History:  Procedure Laterality Date  . A-FLUTTER ABLATION N/A 07/22/2017   Procedure: A-FLUTTER ABLATION;  Surgeon: Thompson Grayer, MD;  Location: [facility];  Service: Cardiovascular;  Laterality: N/A;  . UMBILICAL HERNIA REPAIR  1992    Family History  Problem Relation Age of Onset  . Hypertension Mother   . Diabetes Father   . CAD Father   . Stomach cancer Father   . Clotting disorder Father   . Heart disease Father        transplant  . Allergies Brother     Social History   Socioeconomic History  . Marital status: Divorced    Spouse name: Not on file  . Number of children: 2  . Years of education: Not  on file  . Highest education level: Not on file  Occupational History  . 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 Occupation: Airline pilot    Comment: Disabled  Social Needs  . Financial resource strain: Not on file  . Food insecurity:    Worry: Not on file    Inability: Not on file  . Transportation needs:    Medical: Not on file    Non-medical: Not on file  Tobacco Use  . Smoking status: Former Smoker    Packs/day: 0.25    Years: 1.00    Pack years: 0.25    Types: Cigarettes    Last attempt to quit: 07/23/1989    Years since quitting: 28.0  . Smokeless tobacco: Never Used  Substance and Sexual Activity  . Alcohol use: No    Alcohol/week: 0.0 oz  . Drug use: No  . Sexual activity: Not Currently  Lifestyle  . Physical activity:    Days per week: Not on file    Minutes per session: Not on file  . Stress: Not on file  Relationships  . Social connections:    Talks on phone: Not on file    Gets together: Not on file    Attends religious service: Not on file    Active member of club or organization: Not on file    Attends meetings of clubs or organizations: Not on file    Relationship status: Not on file  . Intimate partner violence:    Fear of current or ex partner: Not on file    Emotionally abused: Not on file    Physically abused: Not on file    Forced sexual activity: Not on file  Other Topics Concern  . Not on file  Social History Narrative   Pt lives in [place], alone.   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   Has 2 sons in [place]      No living will   Would want sons to make decisions for him   Would accept resuscitation   Review of Systems  Sleeps okay---nocturia x 2 generally Discussed can push up second diuretic to 2PM or so (was taking in evening) Bowels are okay now---some constipation at times     Objective:   Physical Exam  Constitutional: No distress.  Neck: No thyromegaly present.  Cardiovascular: Normal rate,  regular rhythm and normal heart sounds. Exam reveals no gallop.  No murmur heard. Pulmonary/Chest: Effort normal and breath sounds normal. No respiratory distress. He has no wheezes. He has no rales.  Musculoskeletal: He exhibits  no edema.  Lymphadenopathy:    He has no cervical adenopathy.  Psychiatric: He has a normal mood and affect. His behavior is normal.          Assessment & Plan:

## 2017-08-05 NOTE — Assessment & Plan Note (Signed)
Will set up with nephrologist

## 2017-08-10 ENCOUNTER — Other Ambulatory Visit: Payer: Self-pay | Admitting: Internal Medicine

## 2017-08-18 NOTE — Progress Notes (Signed)
Advanced Heart Failure Clinic Note   Referring Physician: [name] Primary Care: Silvio Pate Primary Cardiologist: [name]   HPI:  Phillip [name] is a 57 y.o. male hx of PAF, remote PE, hx of DVT, OSA, morbid obesity and HTN also with combined chronic systolic and diastolic HF. With EF 40-45%. He has been wearing CPAP. With CHA2DS2VASc score 2 and pt on xarelto.   Echo 12/21/16 with EF 55-60% G2DD, mild aortic regurgitation. Last nuc 2008 no ischemia, no hx of cath. Referred by Dr. [name] for assistance with HF management.   He has had multiple admissions to the hospital for volume overload/acute on chronic diastolic heart failure. Last admit in 12/18 d/c weight was 368. Lowest weight in last few month recorded 353.   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   He presents today for follow up. S/p AFL ablation 07/22/17. Saw Dr. Rayann Heman this am and doing well. His breathing has improved. He states he noticed a difference in his functional status the day after his ablation. He denies CP or palpitations. No lightheadedness or dizziness. He has not taken his medicines yet today, and only got off of work at [place]. Using CPAP every night. Wants to lose weight, and asks if he can start using his treadmill.   Review of systems complete and found to be negative unless listed in HPI.    Past Medical History:  Diagnosis Date  . CHF (congestive heart failure) ([place]) 11/15   EF 40-45%, improved with follow-up  . COPD (chronic obstructive pulmonary disease) ([place])   . Gout   . History of DVT (deep vein thrombosis)   . Hypertension   . Hypertensive cardiovascular disease   . Kidney dysfunction   . Morbid obesity ([place])   . OSA on CPAP   . Paroxysmal atrial fibrillation ([facility])   . PNA (pneumonia)   . Pulmonary embolism ([place]) 2010   left leg DVT   Current Outpatient Medications  Medication Sig Dispense Refill  . carvedilol (COREG) 6.25 MG tablet Take 1 tablet (6.25 mg total) by mouth 2 (two)  times daily with a meal. 60 tablet 11  . hydrALAZINE (APRESOLINE) 50 MG tablet Take 1 tablet (50 mg total) by mouth 3 (three) times daily. 90 tablet 11  . OXYGEN Inhale 2 L into the lungs daily. 2lpm when needed per pt     . potassium chloride SA (K-DUR,KLOR-CON) 20 MEQ tablet Take 1.5 tablets (30 mEq total) by mouth daily. 90 tablet 3  . rivaroxaban (XARELTO) 20 MG TABS tablet Take 1 tablet (20 mg total) by mouth daily with supper. 90 tablet 2  . torsemide (DEMADEX) 20 MG tablet Take 3 tablets (60 mg total) by mouth 2 (two) times daily. 540 tablet 3  . albuterol (PROVENTIL HFA;VENTOLIN HFA) 108 (90 Base) MCG/ACT inhaler Inhale 2 puffs into the lungs every 4 (four) hours as needed for wheezing or shortness of breath. 1 Inhaler 0  . albuterol (PROVENTIL) (2.5 MG/3ML) 0.083% nebulizer solution Take 3 mLs (2.5 mg total) by nebulization every 6 (six) hours as needed for wheezing or shortness of breath. 360 mL 11  . allopurinol (ZYLOPRIM) 300 MG tablet Take 1 tablet (300 mg total) daily by mouth. 90 tablet 3  . budesonide-formoterol (SYMBICORT) 80-4.5 MCG/ACT inhaler Inhale 2 puffs 2 (two) times daily into the lungs. 3 Inhaler 3  . cloNIDine (CATAPRES) 0.1 MG tablet TAKE 1 TABLET BY MOUTH THREE TIMES A DAY 90 tablet 3  . colchicine 0.6 MG  tablet Take 1 tablet (0.6 mg total) by mouth daily. (Patient taking differently: Take 0.6 mg by mouth daily as needed (gout). ) 30 tablet 0  . montelukast (SINGULAIR) 10 MG tablet Take 1 tablet (10 mg total) by mouth at bedtime. 30 tablet 11  . naproxen sodium (ALEVE) 220 MG tablet Take 440 mg by mouth daily as needed (pain).    [name] oxymetazoline (AFRIN NASAL SPRAY) 0.05 % nasal spray Place 1 spray into both nostrils 2 (two) times daily as needed for congestion.    [name] UNABLE TO FIND Med Name: CPAP with sleep     No current facility-administered medications for this encounter.    No Known Allergies  Social History   Socioeconomic History  . Marital status: Divorced      Spouse name: Not on file  . Number of children: 2  . Years of education: Not on file  . Highest education level: Not on file  Occupational History  . 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 Occupation: Airline pilot    Comment: Disabled  Social Needs  . Financial resource strain: Not on file  . Food insecurity:    Worry: Not on file    Inability: Not on file  . Transportation needs:    Medical: Not on file    Non-medical: Not on file  Tobacco Use  . Smoking status: Former Smoker    Packs/day: 0.25    Years: 1.00    Pack years: 0.25    Types: Cigarettes    Last attempt to quit: 07/23/1989    Years since quitting: 28.0  . Smokeless tobacco: Never Used  Substance and Sexual Activity  . Alcohol use: No    Alcohol/week: 0.0 oz  . Drug use: No  . Sexual activity: Not Currently  Lifestyle  . Physical activity:    Days per week: Not on file    Minutes per session: Not on file  . Stress: Not on file  Relationships  . Social connections:    Talks on phone: Not on file    Gets together: Not on file    Attends religious service: Not on file    Active member of club or organization: Not on file    Attends meetings of clubs or organizations: Not on file    Relationship status: Not on file  . Intimate partner violence:    Fear of current or ex partner: Not on file    Emotionally abused: Not on file    Physically abused: Not on file    Forced sexual activity: Not on file  Other Topics Concern  . Not on file  Social History Narrative   Pt lives in [place], alone.   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   Has 2 sons in [place]      No living will   Would want sons to make decisions for him   Would accept resuscitation    Family History  Problem Relation Age of Onset  . Hypertension Mother   . Diabetes Father   . CAD Father   . Stomach cancer Father   . Clotting disorder Father   . Heart disease Father        transplant  . Allergies Brother     Vitals:   08/19/17 1050  BP: (!) 154/98  Pulse: 87  SpO2: 98%  Weight: (!) 379 lb 9.6 oz (172.2 kg)   Wt Readings from Last 3 Encounters:  08/19/17 [name])  379 lb 9.6 oz (172.2 kg)  08/19/17 (!) 380 lb (172.4 kg)  08/05/17 (!) 381 lb (172.8 kg)   PHYSICAL EXAM:  General: Well appearing. No resp difficulty. Markedly obese.  HEENT: Normal Neck: Supple. JVP 5-6. Carotids 2+ bilat; no bruits. No thyromegaly or nodule noted. Cor: PMI nondisplaced. RRR, No M/G/R noted Lungs: CTAB, normal effort. Abdomen: Obese, oft, non-tender, non-distended, no HSM. No bruits or masses. +BS  Extremities: No cyanosis, clubbing, or rash. Trace ankle edema.  Neuro: Alert & orientedx3, cranial nerves grossly intact. moves all 4 extremities w/o difficulty. Affect pleasant   ASSESSMENT & PLAN:  1. Chronic diastolic HF - Echo 03/6071 LVEF 55-60%, Grade 2 DD.  - NYHA II-III symptoms.  - Volume status stable on exam.   - Continue torsemide 60 mg BID. Can take 80 BID as needed.  - No spiro for now with AKI and CKD IV.   - Reinforced fluid restriction to < 2 L daily, sodium restriction to less than 2000 mg daily, and the importance of daily weights.    2. PAF / Typical A-flutter, paroxysmal - s/p AFL ablation 07/22/17 - EKG today shows NSR 81 bpm with PAC - Continue Xarelto 20 mg daily. Appropriate dose per Pharm-D.  - Saw Dr. Rayann Heman this am. All stable.   3. Hypertension.  - Elevated on arrival but has not had meds yet. Was stable at Dr. Bonita Quin office this am.  - Continue hydralazine 50 TID - Continue coreg 6.25 mg BID.   6. Morbid obesity - Body mass index is 56.06 kg/m.  - Needs weight loss. Interested in bariatric surgery "eventually" but wants to continue to work on his own for now.  - No change to current plan.    5. CKD IV - BMET today.   6. OSA on CPAP - Encouraged nightly use.   7. Screening - His EF was normal in September.  - He is OK for colonoscopy with low/moderate risk for  peri-operative complications due to his body habitus and OSA.   Labs today. Continue current meds. RTC 2 months. Sooner with symptoms.   Shirley Friar, PA-C  11:04 AM   Greater than 50% of the 25 minute visit was spent in counseling/coordination of care regarding disease state education, salt/fluid restriction, sliding scale diuretics, and medication compliance.

## 2017-08-19 ENCOUNTER — Encounter: Payer: Self-pay | Admitting: Internal Medicine

## 2017-08-19 ENCOUNTER — Ambulatory Visit: Payer: Managed Care, Other (non HMO) | Admitting: Internal Medicine

## 2017-08-19 ENCOUNTER — Ambulatory Visit (HOSPITAL_COMMUNITY)
Admission: RE | Admit: 2017-08-19 | Discharge: 2017-08-19 | Disposition: A | Discharge status: Home / Self Care | Payer: Managed Care, Other (non HMO) | Source: Ambulatory Visit | Attending: Internal Medicine | Admitting: Internal Medicine

## 2017-08-19 ENCOUNTER — Encounter (HOSPITAL_COMMUNITY): Payer: Self-pay

## 2017-08-19 VITALS — BP 154/98 | HR 87 | Wt 379.6 lb

## 2017-08-19 VITALS — BP 134/72 | HR 82 | Ht 69.0 in | Wt 380.0 lb

## 2017-08-19 DIAGNOSIS — I10 Essential (primary) hypertension: Secondary | ICD-10-CM | POA: Diagnosis not present

## 2017-08-19 DIAGNOSIS — Z9989 Dependence on other enabling machines and devices: Secondary | ICD-10-CM | POA: Diagnosis not present

## 2017-08-19 DIAGNOSIS — I5032 Chronic diastolic (congestive) heart failure: Secondary | ICD-10-CM

## 2017-08-19 DIAGNOSIS — Z8249 Family history of ischemic heart disease and other diseases of the circulatory system: Secondary | ICD-10-CM | POA: Insufficient documentation

## 2017-08-19 DIAGNOSIS — Z7951 Long term (current) use of inhaled steroids: Secondary | ICD-10-CM | POA: Diagnosis not present

## 2017-08-19 DIAGNOSIS — I5042 Chronic combined systolic (congestive) and diastolic (congestive) heart failure: Secondary | ICD-10-CM | POA: Insufficient documentation

## 2017-08-19 DIAGNOSIS — J449 Chronic obstructive pulmonary disease, unspecified: Secondary | ICD-10-CM | POA: Diagnosis not present

## 2017-08-19 DIAGNOSIS — Z6841 Body Mass Index (BMI) 40.0 and over, adult: Secondary | ICD-10-CM | POA: Diagnosis not present

## 2017-08-19 DIAGNOSIS — Z79899 Other long term (current) drug therapy: Secondary | ICD-10-CM | POA: Insufficient documentation

## 2017-08-19 DIAGNOSIS — I130 Hypertensive heart and chronic kidney disease with heart failure and stage 1 through stage 4 chronic kidney disease, or unspecified chronic kidney disease: Secondary | ICD-10-CM | POA: Diagnosis not present

## 2017-08-19 DIAGNOSIS — Z86711 Personal history of pulmonary embolism: Secondary | ICD-10-CM | POA: Diagnosis not present

## 2017-08-19 DIAGNOSIS — Z87891 Personal history of nicotine dependence: Secondary | ICD-10-CM | POA: Diagnosis not present

## 2017-08-19 DIAGNOSIS — Z7901 Long term (current) use of anticoagulants: Secondary | ICD-10-CM | POA: Diagnosis not present

## 2017-08-19 DIAGNOSIS — G4733 Obstructive sleep apnea (adult) (pediatric): Secondary | ICD-10-CM

## 2017-08-19 DIAGNOSIS — I4892 Unspecified atrial flutter: Secondary | ICD-10-CM | POA: Diagnosis not present

## 2017-08-19 DIAGNOSIS — N184 Chronic kidney disease, stage 4 (severe): Secondary | ICD-10-CM | POA: Diagnosis not present

## 2017-08-19 DIAGNOSIS — I483 Typical atrial flutter: Secondary | ICD-10-CM

## 2017-08-19 DIAGNOSIS — Z9889 Other specified postprocedural states: Secondary | ICD-10-CM | POA: Insufficient documentation

## 2017-08-19 DIAGNOSIS — M109 Gout, unspecified: Secondary | ICD-10-CM | POA: Insufficient documentation

## 2017-08-19 DIAGNOSIS — I480 Paroxysmal atrial fibrillation: Secondary | ICD-10-CM

## 2017-08-19 DIAGNOSIS — Z86718 Personal history of other venous thrombosis and embolism: Secondary | ICD-10-CM | POA: Insufficient documentation

## 2017-08-19 LAB — BASIC METABOLIC PANEL
ANION GAP: 10 (ref 5–15)
BUN: 27 mg/dL — ABNORMAL HIGH (ref 6–20)
CALCIUM: 9.1 mg/dL (ref 8.9–10.3)
CO2: 31 mmol/L (ref 22–32)
Chloride: 101 mmol/L (ref 101–111)
Creatinine, Ser: 2.39 mg/dL — ABNORMAL HIGH (ref 0.61–1.24)
GFR, EST AFRICAN AMERICAN: 33 mL/min — AB (ref >60)
GFR, EST NON AFRICAN AMERICAN: 29 mL/min — AB (ref >60)
GLUCOSE: 92 mg/dL (ref 65–99)
Potassium: 3.3 mmol/L — ABNORMAL LOW (ref 3.5–5.1)
Sodium: 142 mmol/L (ref 135–145)

## 2017-08-19 NOTE — Patient Instructions (Addendum)
Medication Instructions:  Your physician recommends that you continue on your current medications as directed. Please refer to the Current Medication list given to you today.   Labwork: None ordered  Testing/Procedures: None ordered  Follow-Up: Your physician recommends that you follow up with Dr. Rayann Heman AS NEEDED    Any Other Special Instructions Will Be Listed Below (If Applicable).     If you need a refill on your cardiac medications before your next appointment, please call your pharmacy.

## 2017-08-19 NOTE — Progress Notes (Signed)
PCP: [name], MD Primary Cardiologist: Bensimhon Primary EP: Dr Tyrone Schimke is a 57 y.o. male who presents today for routine electrophysiology followup.  Since his recent atrial flutter ablation, the patient reports doing very well.  Today, he denies symptoms of palpitations, chest pain, shortness of breath,  lower extremity edema, dizziness, presyncope, or syncope.  The patient is otherwise without complaint today.   Past Medical History:  Diagnosis Date  . CHF (congestive heart failure) ([place]) 11/15   EF 40-45%, improved with follow-up  . COPD (chronic obstructive pulmonary disease) ([place])   . Gout   . History of DVT (deep vein thrombosis)   . Hypertension   . Hypertensive cardiovascular disease   . Kidney dysfunction   . Morbid obesity ([place])   . OSA on CPAP   . Paroxysmal atrial fibrillation ([facility])   . PNA (pneumonia)   . Pulmonary embolism ([place]) 2010   left leg DVT   Past Surgical History:  Procedure Laterality Date  . A-FLUTTER ABLATION N/A 07/22/2017   Procedure: A-FLUTTER ABLATION;  Surgeon: Thompson Grayer, MD;  Location: [facility];  Service: Cardiovascular;  Laterality: N/A;  . UMBILICAL HERNIA REPAIR  1992    ROS- all systems are reviewed and negatives except as per HPI above  Current Outpatient Medications  Medication Sig Dispense Refill  . albuterol (PROVENTIL HFA;VENTOLIN HFA) 108 (90 Base) MCG/ACT inhaler Inhale 2 puffs into the lungs every 4 (four) hours as needed for wheezing or shortness of breath. 1 Inhaler 0  . albuterol (PROVENTIL) (2.5 MG/3ML) 0.083% nebulizer solution Take 3 mLs (2.5 mg total) by nebulization every 6 (six) hours as needed for wheezing or shortness of breath. 360 mL 11  . allopurinol (ZYLOPRIM) 300 MG tablet Take 1 tablet (300 mg total) daily by mouth. 90 tablet 3  . budesonide-formoterol (SYMBICORT) 80-4.5 MCG/ACT inhaler Inhale 2 puffs 2 (two) times daily into the lungs. 3 Inhaler 3  . carvedilol (COREG) 6.25 MG  tablet Take 1 tablet (6.25 mg total) by mouth 2 (two) times daily with a meal. 60 tablet 11  . cloNIDine (CATAPRES) 0.1 MG tablet TAKE 1 TABLET BY MOUTH THREE TIMES A DAY 90 tablet 3  . colchicine 0.6 MG tablet Take 1 tablet (0.6 mg total) by mouth daily. (Patient taking differently: Take 0.6 mg by mouth daily as needed (gout). ) 30 tablet 0  . hydrALAZINE (APRESOLINE) 50 MG tablet Take 1 tablet (50 mg total) by mouth 3 (three) times daily. 90 tablet 11  . montelukast (SINGULAIR) 10 MG tablet Take 1 tablet (10 mg total) by mouth at bedtime. 30 tablet 11  . naproxen sodium (ALEVE) 220 MG tablet Take 440 mg by mouth daily as needed (pain).    . OXYGEN Inhale 2 L into the lungs daily. 2lpm when needed per pt     . oxymetazoline (AFRIN NASAL SPRAY) 0.05 % nasal spray Place 1 spray into both nostrils 2 (two) times daily as needed for congestion.    . potassium chloride SA (K-DUR,KLOR-CON) 20 MEQ tablet Take 1.5 tablets (30 mEq total) by mouth daily. 90 tablet 3  . rivaroxaban (XARELTO) 20 MG TABS tablet Take 1 tablet (20 mg total) by mouth daily with supper. 90 tablet 2  . spironolactone (ALDACTONE) 25 MG tablet Take 1 tablet (25 mg total) by mouth daily. 30 tablet 3  . torsemide (DEMADEX) 20 MG tablet Take 3 tablets (60 mg total) by mouth 2 (two) times daily. 540 tablet 3  .  UNABLE TO FIND Med Name: CPAP with sleep     No current facility-administered medications for this visit.     Physical Exam: Vitals:   08/19/17 0918  BP: 134/72  Pulse: 82  Weight: (!) 380 lb (172.4 kg)  Height: 5\' 9"  (1.753 m)    GEN- The patient is well appearing, alert and oriented x 3 today.   Head- normocephalic, atraumatic Eyes-  Sclera clear, conjunctiva pink Ears- hearing intact Oropharynx- clear Lungs- Clear to ausculation bilaterally, normal work of breathing Heart- Regular rate and rhythm with rare ectopy, no murmurs, rubs or gallops, PMI not laterally displaced GI- soft, NT, ND, + BS Extremities- no  clubbing, cyanosis, or edema  Wt Readings from Last 3 Encounters:  08/19/17 (!) 380 lb (172.4 kg)  08/05/17 (!) 381 lb (172.8 kg)  07/22/17 (!) 375 lb (170.1 kg)    EKG tracing ordered today is personally reviewed and shows sinus rhythm with PACs, nonspecific ST/T changes  Assessment and Plan:  1. Atrial flutter Doing well s/p atrial flutter ablation No recurrence Give prior DVTs, pulmonary embolism, and high likelihood of afib down the road given severe LA enlargement, I would not stop anticoagulation at this time.   2. Obesity Lifestyle modification advised Body mass index is 56.12 kg/m.  Would consider bariatric surgery  3. Chronic diastolic dysfunction/ HTN Will defer to Dr Bensimhon's team who will see later today  He will follow-up with Dr Haroldine Laws and I will see as needed going forward  Thompson Grayer MD, [facility] 08/19/2017 10:22 AM

## 2017-08-19 NOTE — Patient Instructions (Signed)
Routine lab work today. Will notify you of abnormal results, otherwise no news is good news!  STOP Spironolactone.  Follow up 2 months.  ______________________________________________________________ Phillip [name] Code:  1400  Take all medication as prescribed the day of your appointment. Bring all medications with you to your appointment.  Do the following things EVERYDAY: 1) Weigh yourself in the morning before breakfast. Write it down and keep it in a log. 2) Take your medicines as prescribed 3) Eat low salt foods-Limit salt (sodium) to 2000 mg per day.  4) Stay as active as you can everyday 5) Limit all fluids for the day to less than 2 liters

## 2017-08-21 ENCOUNTER — Telehealth (HOSPITAL_COMMUNITY): Payer: Self-pay | Admitting: Cardiology

## 2017-08-21 DIAGNOSIS — E876 Hypokalemia: Secondary | ICD-10-CM

## 2017-08-21 MED ORDER — POTASSIUM CHLORIDE CRYS ER 20 MEQ PO TBCR: 40.0000 meq | EXTENDED_RELEASE_TABLET | Freq: Every day | ORAL | 3 refills | Status: DC

## 2017-08-21 NOTE — Telephone Encounter (Signed)
Notes recorded by Kerry Dory, CMA on 08/21/2017 at 10:21 AM EDT Patient aware. Patient voiced understanding   ------  Notes recorded by Kerry Dory, CMA on 08/19/2017 at 1:52 PM EDT Left message for patient to call back. [phone] (M) ------  Notes recorded by Shirley Friar, PA-C on 08/19/2017 at 12:39 PM EDT Take extra 40 meq of K x 1, then increase chronic dosing to 40 meq daily.    Legrand Como [address]" [place], [place] 08/19/2017 12:39 PM

## 2017-08-21 NOTE — Telephone Encounter (Signed)
-----   Message from Shirley Friar, PA-C sent at 08/19/2017 12:39 PM EDT ----- Take extra 40 meq of K x 1, then increase chronic dosing to 40 meq daily.    Legrand Como [address]" [place], [place] 08/19/2017 12:39 PM

## 2017-09-02 ENCOUNTER — Telehealth: Payer: Self-pay

## 2017-09-02 NOTE — Telephone Encounter (Signed)
Probably should stop 2 days before the procedure to be safe, and start again ASAP after

## 2017-09-02 NOTE — Telephone Encounter (Signed)
Per pts med list Dr Thompson Grayer prescribed Xarelto.

## 2017-09-02 NOTE — Telephone Encounter (Signed)
Copied from [place] [id number]. Topic: General - Other >> Sep 02, 2017  9:21 AM Oneta Rack wrote: Relation to pt: self  Call back number: [phone]    Reason for call:  Patient currently on rivaroxaban (XARELTO) 20 MG TABS tablet and as per patient dentist in order for tooth extraction patient has to d/c medication. Patient would like to know how many days does he have to d/c prior to tooth extraction, please advise >> Sep 02, 2017  9:28 AM Oneta Rack wrote: Relation to pt: self  Call back number: [phone]    Reason for call:  Patient currently on rivaroxaban (XARELTO) 20 MG TABS tablet and as per patient dentist in order for tooth extraction patient has to d/c medication. Patient would like to know how many days does he have to d/c prior to tooth extraction, please advise

## 2017-09-03 NOTE — Telephone Encounter (Signed)
Spoke to pt. He will take Dr Alla German recommendation.

## 2017-09-17 ENCOUNTER — Other Ambulatory Visit: Payer: Self-pay

## 2017-09-17 ENCOUNTER — Emergency Department (HOSPITAL_COMMUNITY): Payer: Managed Care, Other (non HMO)

## 2017-09-17 ENCOUNTER — Emergency Department (HOSPITAL_COMMUNITY)
Admission: EM | Admit: 2017-09-17 | Discharge: 2017-09-18 | Disposition: A | Discharge status: Home / Self Care | Payer: Managed Care, Other (non HMO) | Attending: Emergency Medicine | Admitting: Emergency Medicine

## 2017-09-17 ENCOUNTER — Encounter (HOSPITAL_COMMUNITY): Payer: Self-pay

## 2017-09-17 DIAGNOSIS — Z87891 Personal history of nicotine dependence: Secondary | ICD-10-CM | POA: Diagnosis not present

## 2017-09-17 DIAGNOSIS — R110 Nausea: Secondary | ICD-10-CM

## 2017-09-17 DIAGNOSIS — I130 Hypertensive heart and chronic kidney disease with heart failure and stage 1 through stage 4 chronic kidney disease, or unspecified chronic kidney disease: Secondary | ICD-10-CM | POA: Insufficient documentation

## 2017-09-17 DIAGNOSIS — N184 Chronic kidney disease, stage 4 (severe): Secondary | ICD-10-CM | POA: Diagnosis not present

## 2017-09-17 DIAGNOSIS — H81399 Other peripheral vertigo, unspecified ear: Secondary | ICD-10-CM | POA: Insufficient documentation

## 2017-09-17 DIAGNOSIS — Z7901 Long term (current) use of anticoagulants: Secondary | ICD-10-CM | POA: Insufficient documentation

## 2017-09-17 DIAGNOSIS — I509 Heart failure, unspecified: Secondary | ICD-10-CM | POA: Insufficient documentation

## 2017-09-17 DIAGNOSIS — Z79899 Other long term (current) drug therapy: Secondary | ICD-10-CM | POA: Insufficient documentation

## 2017-09-17 DIAGNOSIS — R42 Dizziness and giddiness: Secondary | ICD-10-CM

## 2017-09-17 DIAGNOSIS — J45909 Unspecified asthma, uncomplicated: Secondary | ICD-10-CM | POA: Diagnosis not present

## 2017-09-17 LAB — I-STAT CHEM 8, ED
BUN: 30 mg/dL — ABNORMAL HIGH (ref 6–20)
CREATININE: 2 mg/dL — AB (ref 0.61–1.24)
Calcium, Ion: 1.17 mmol/L (ref 1.15–1.40)
Chloride: 101 mmol/L (ref 98–111)
GLUCOSE: 101 mg/dL — AB (ref 70–99)
HCT: 39 % (ref 39.0–52.0)
Hemoglobin: 13.3 g/dL (ref 13.0–17.0)
Potassium: 3.4 mmol/L — ABNORMAL LOW (ref 3.5–5.1)
Sodium: 144 mmol/L (ref 135–145)
TCO2: 28 mmol/L (ref 22–32)

## 2017-09-17 NOTE — ED Triage Notes (Signed)
Pt presents to ED from home with dizziness and nausea. Pt reports that the dizziness started suddenly around 1 pm while he was working outside.

## 2017-09-18 ENCOUNTER — Telehealth: Payer: Self-pay

## 2017-09-18 ENCOUNTER — Emergency Department (HOSPITAL_COMMUNITY): Payer: Managed Care, Other (non HMO)

## 2017-09-18 DIAGNOSIS — H81399 Other peripheral vertigo, unspecified ear: Secondary | ICD-10-CM | POA: Diagnosis not present

## 2017-09-18 DIAGNOSIS — I509 Heart failure, unspecified: Secondary | ICD-10-CM | POA: Diagnosis not present

## 2017-09-18 DIAGNOSIS — N184 Chronic kidney disease, stage 4 (severe): Secondary | ICD-10-CM | POA: Diagnosis not present

## 2017-09-18 DIAGNOSIS — Z7901 Long term (current) use of anticoagulants: Secondary | ICD-10-CM | POA: Diagnosis not present

## 2017-09-18 DIAGNOSIS — J45909 Unspecified asthma, uncomplicated: Secondary | ICD-10-CM | POA: Diagnosis not present

## 2017-09-18 DIAGNOSIS — I130 Hypertensive heart and chronic kidney disease with heart failure and stage 1 through stage 4 chronic kidney disease, or unspecified chronic kidney disease: Secondary | ICD-10-CM | POA: Diagnosis not present

## 2017-09-18 DIAGNOSIS — Z79899 Other long term (current) drug therapy: Secondary | ICD-10-CM | POA: Diagnosis not present

## 2017-09-18 DIAGNOSIS — Z87891 Personal history of nicotine dependence: Secondary | ICD-10-CM | POA: Diagnosis not present

## 2017-09-18 DIAGNOSIS — R42 Dizziness and giddiness: Secondary | ICD-10-CM | POA: Diagnosis present

## 2017-09-18 LAB — DIFFERENTIAL
BASOS ABS: 0 10*3/uL (ref 0.0–0.1)
Basophils Relative: 1 %
Eosinophils Absolute: 0.1 10*3/uL (ref 0.0–0.7)
Eosinophils Relative: 1 %
LYMPHS ABS: 1.4 10*3/uL (ref 0.7–4.0)
LYMPHS PCT: 29 %
Monocytes Absolute: 0.4 10*3/uL (ref 0.1–1.0)
Monocytes Relative: 9 %
NEUTROS PCT: 60 %
Neutro Abs: 2.9 10*3/uL (ref 1.7–7.7)

## 2017-09-18 LAB — URINALYSIS, ROUTINE W REFLEX MICROSCOPIC
BILIRUBIN URINE: NEGATIVE
Glucose, UA: NEGATIVE mg/dL
Hgb urine dipstick: NEGATIVE
Ketones, ur: NEGATIVE mg/dL
Leukocytes, UA: NEGATIVE
Nitrite: NEGATIVE
Protein, ur: NEGATIVE mg/dL
SPECIFIC GRAVITY, URINE: 1.009 (ref 1.005–1.030)
pH: 5 (ref 5.0–8.0)

## 2017-09-18 LAB — CBC
HCT: 38.7 % — ABNORMAL LOW (ref 39.0–52.0)
HEMOGLOBIN: 12.5 g/dL — AB (ref 13.0–17.0)
MCH: 29.5 pg (ref 26.0–34.0)
MCHC: 32.3 g/dL (ref 30.0–36.0)
MCV: 91.3 fL (ref 78.0–100.0)
Platelets: 194 10*3/uL (ref 150–400)
RBC: 4.24 MIL/uL (ref 4.22–5.81)
RDW: 14.8 % (ref 11.5–15.5)
WBC: 4.9 10*3/uL (ref 4.0–10.5)

## 2017-09-18 LAB — COMPREHENSIVE METABOLIC PANEL
ALBUMIN: 4 g/dL (ref 3.5–5.0)
ALK PHOS: 79 U/L (ref 38–126)
ALT: 16 U/L (ref 0–44)
AST: 24 U/L (ref 15–41)
Anion gap: 8 (ref 5–15)
BILIRUBIN TOTAL: 0.9 mg/dL (ref 0.3–1.2)
BUN: 32 mg/dL — AB (ref 6–20)
CALCIUM: 9.5 mg/dL (ref 8.9–10.3)
CO2: 33 mmol/L — ABNORMAL HIGH (ref 22–32)
Chloride: 105 mmol/L (ref 98–111)
Creatinine, Ser: 2.08 mg/dL — ABNORMAL HIGH (ref 0.61–1.24)
GFR calc Af Amer: 40 mL/min — ABNORMAL LOW (ref >60)
GFR calc non Af Amer: 34 mL/min — ABNORMAL LOW (ref >60)
GLUCOSE: 108 mg/dL — AB (ref 70–99)
Potassium: 3.5 mmol/L (ref 3.5–5.1)
Sodium: 146 mmol/L — ABNORMAL HIGH (ref 135–145)
TOTAL PROTEIN: 7.2 g/dL (ref 6.5–8.1)

## 2017-09-18 LAB — PROTIME-INR
INR: 2.25
Prothrombin Time: 24.7 seconds — ABNORMAL HIGH (ref 11.4–15.2)

## 2017-09-18 LAB — APTT: APTT: 50 s — AB (ref 24–36)

## 2017-09-18 LAB — RAPID URINE DRUG SCREEN, HOSP PERFORMED
AMPHETAMINES: NOT DETECTED
Benzodiazepines: NOT DETECTED
Cocaine: NOT DETECTED
Opiates: NOT DETECTED
Tetrahydrocannabinol: NOT DETECTED

## 2017-09-18 LAB — I-STAT TROPONIN, ED: Troponin i, poc: 0.03 ng/mL (ref 0.00–0.08)

## 2017-09-18 LAB — ETHANOL: Alcohol, Ethyl (B): <10 mg/dL (ref <10)

## 2017-09-18 MED ORDER — LORAZEPAM 2 MG/ML IJ SOLN
1.0000 mg | Freq: Once | INTRAMUSCULAR | Status: AC
  Administered 2017-09-18: 1 mg via INTRAVENOUS
  Filled 2017-09-18: qty 1

## 2017-09-18 NOTE — ED Notes (Signed)
Pt ambulated is hallway wihtout assistance but still complained of feeling off balanced

## 2017-09-18 NOTE — ED Provider Notes (Signed)
MSE was initiated and I personally evaluated the patient and placed orders (if any) at  3:08 AM on September 18, 2017.  The patient appears stable so that the remainder of the MSE may be completed by another provider.  Here for MRI to r/o CVA as cause of vertigo  PERRL MMM RRR CTAB NABS 5/5 throughout  Now en route to MRI   Maelie Chriswell, MD 09/18/17 0388

## 2017-09-18 NOTE — Telephone Encounter (Signed)
Spoke to pt. He said he is doing a little better. Balance is still a little off.

## 2017-09-18 NOTE — ED Notes (Signed)
ED Provider at bedside. 

## 2017-09-18 NOTE — ED Notes (Signed)
Patient transported to MRI 

## 2017-09-18 NOTE — ED Notes (Signed)
Nausea had resolved however pt continues to "have a little dizziness"

## 2017-09-18 NOTE — ED Provider Notes (Signed)
[facility] DEPT Provider Note   CSN: [id number] Arrival date & time: 09/17/17  2214     History   Chief Complaint Chief Complaint  Patient presents with  . Dizziness  . Nausea    HPI Phillip [name] is a 57 y.o. male.  HPI  57 year old with history of CHF, COPD, CKD, A. Fib, PE on Xarelto comes in with chief complaint of dizziness and nausea. Patient states that he started having dizziness and nausea around 1 PM today.  Patient's dizziness is constant, worse when he is moving.  Dizziness is described as lack of equilibrium.  Patient denies any associated numbness, tingling, vision changes, weakness, slurred speech.  Patient denies any history of strokes, TIA.  Past Medical History:  Diagnosis Date  . CHF (congestive heart failure) ([place]) 11/15   EF 40-45%, improved with follow-up  . COPD (chronic obstructive pulmonary disease) ([place])   . Gout   . History of DVT (deep vein thrombosis)   . Hypertension   . Hypertensive cardiovascular disease   . Kidney dysfunction   . Morbid obesity ([place])   . OSA on CPAP   . Paroxysmal atrial fibrillation ([facility])   . PNA (pneumonia)   . Pulmonary embolism ([place]) 2010   left leg DVT    Patient Active Problem List   Diagnosis Date Noted  . Atrial flutter ([place]) 06/22/2017  . Anemia 03/23/2017  . CHF (congestive heart failure) ([place]) 03/10/2017  . Asthma with COPD ([place])   . Chronic diastolic heart failure ([place])   . Long term (current) use of anticoagulants [Z79.01] 08/17/2015  . Hx pulmonary embolism 08/17/2015  . Deep vein thrombosis (DVT) of lower extremity ([place]) 08/17/2015  . Paroxysmal atrial fibrillation ([place]) 08/04/2015  . SVT (supraventricular tachycardia) ([place]) 06/22/2015  . Dyspnea on exertion 06/20/2015  . Morbid obesity due to excess calories ([place]) 03/07/2015  . Elevated troponin 02/20/2015  . Essential hypertension   . Gout   . Mild persistent asthma in adult without complication 74/25/9563  . OSA  on CPAP 05/06/2014  . Chronic kidney disease, stage IV (severe) ([place]) 05/06/2014  . Asthma with acute exacerbation 05/06/2014    Past Surgical History:  Procedure Laterality Date  . A-FLUTTER ABLATION N/A 07/22/2017   Procedure: A-FLUTTER ABLATION;  Surgeon: Thompson Grayer, MD;  Location: [facility];  Service: Cardiovascular;  Laterality: N/A;  . [place] Medications    Prior to Admission medications   Medication Sig Start Date End Date Taking? Authorizing Provider  albuterol (PROVENTIL HFA;VENTOLIN HFA) 108 (90 Base) MCG/ACT inhaler Inhale 2 puffs into the lungs every 4 (four) hours as needed for wheezing or shortness of breath. 05/11/17  Yes Shirley Friar, PA-C  albuterol (PROVENTIL) (2.5 MG/3ML) 0.083% nebulizer solution Take 3 mLs (2.5 mg total) by nebulization every 6 (six) hours as needed for wheezing or shortness of breath. 09/12/16  Yes Tanda Rockers, MD  allopurinol (ZYLOPRIM) 300 MG tablet Take 1 tablet (300 mg total) daily by mouth. 01/28/17  Yes [name], MD  carvedilol (COREG) 6.25 MG tablet Take 1 tablet (6.25 mg total) by mouth 2 (two) times daily with a meal. 06/22/17  Yes Tillery, Satira Mccallum, PA-C  cloNIDine (CATAPRES) 0.1 MG tablet TAKE 1 TABLET BY MOUTH THREE TIMES A DAY 08/10/17  Yes [name] I, MD  colchicine 0.6 MG tablet Take 1 tablet (0.6 mg total) by mouth daily. Patient taking differently:  Take 0.6 mg by mouth daily as needed (gout).  07/07/16  Yes Edrick Kins, DPM  hydrALAZINE (APRESOLINE) 50 MG tablet Take 1 tablet (50 mg total) by mouth 3 (three) times daily. 06/22/17  Yes Shirley Friar, PA-C  montelukast (SINGULAIR) 10 MG tablet Take 1 tablet (10 mg total) by mouth at bedtime. 12/16/16  Yes Tanda Rockers, MD  naproxen sodium (ALEVE) 220 MG tablet Take 440 mg by mouth daily as needed (pain).   Yes [provider]  oxymetazoline (AFRIN NASAL SPRAY) 0.05 % nasal spray Place 1  spray into both nostrils 2 (two) times daily as needed for congestion. 03/12/17  Yes Oswald Hillock, MD  potassium chloride SA (K-DUR,KLOR-CON) 20 MEQ tablet Take 2 tablets (40 mEq total) by mouth daily. 08/21/17  Yes Shirley Friar, PA-C  rivaroxaban (XARELTO) 20 MG TABS tablet Take 1 tablet (20 mg total) by mouth daily with supper. 07/28/17  Yes Allred, Jeneen Rinks, MD  torsemide (DEMADEX) 20 MG tablet Take 3 tablets (60 mg total) by mouth 2 (two) times daily. 01/20/17 01/15/18 Yes Belva Crome, MD  budesonide-formoterol [facility]) 80-4.5 MCG/ACT inhaler Inhale 2 puffs 2 (two) times daily into the lungs. 02/06/17   Tanda Rockers, MD  OXYGEN Inhale 2 L into the lungs daily. 2lpm when needed per pt     [provider]  UNABLE TO FIND Med Name: CPAP with sleep    [provider]    Family History Family History  Problem Relation Age of Onset  . Hypertension Mother   . Diabetes Father   . CAD Father   . Stomach cancer Father   . Clotting disorder Father   . Heart disease Father        transplant  . Allergies Brother     Social History Social History   Tobacco Use  . Smoking status: Former Smoker    Packs/day: 0.25    Years: 1.00    Pack years: 0.25    Types: Cigarettes    Last attempt to quit: 07/23/1989    Years since quitting: 28.1  . Smokeless tobacco: Never Used  Substance Use Topics  . Alcohol use: No    Alcohol/week: 0.0 oz  . Drug use: No     Allergies   Patient has no known allergies.   Review of Systems Review of Systems  Constitutional: Positive for activity change.  Respiratory: Negative for shortness of breath.   Cardiovascular: Negative for chest pain.  Gastrointestinal: Positive for nausea. Negative for vomiting.  Neurological: Positive for light-headedness. Negative for syncope, facial asymmetry, speech difficulty, weakness and numbness.  All other systems reviewed and are negative.    Physical Exam Updated Vital Signs BP (!)  187/78 (BP Location: Left Arm)   Pulse 73   Temp 98.6 F (37 C)   Resp 20   Ht 5\' 9"  (1.753 m)   Wt (!) 172.8 kg (381 lb)   SpO2 100%   BMI 56.26 kg/m   Physical Exam  Constitutional: He is oriented to person, place, and time. He appears well-developed.  HENT:  Head: Atraumatic.  Eyes: Pupils are equal, round, and reactive to light. EOM are normal.  Neck: Neck supple.  Cardiovascular: Normal rate.  Pulmonary/Chest: Effort normal.  Neurological: He is alert and oriented to person, place, and time. No cranial nerve deficit. Coordination normal.  Cerebellar exam is normal (finger to nose) Sensory exam normal for bilateral upper and lower extremities - and patient is able  to discriminate between sharp and dull. Motor exam is 4+/5   Skin: Skin is warm.  Nursing note and vitals reviewed.  ED Treatments / Results  Labs (all labs ordered are listed, but only abnormal results are displayed) Labs Reviewed  PROTIME-INR - Abnormal; Notable for the following components:      Result Value   Prothrombin Time 24.7 (*)    All other components within normal limits  APTT - Abnormal; Notable for the following components:   aPTT 50 (*)    All other components within normal limits  CBC - Abnormal; Notable for the following components:   Hemoglobin 12.5 (*)    HCT 38.7 (*)    All other components within normal limits  RAPID URINE DRUG SCREEN, HOSP PERFORMED - Abnormal; Notable for the following components:   Barbiturates   (*)    Value: Result not available. Reagent lot number recalled by manufacturer.   All other components within normal limits  URINALYSIS, ROUTINE W REFLEX MICROSCOPIC - Abnormal; Notable for the following components:   Color, Urine STRAW (*)    All other components within normal limits  I-STAT CHEM 8, ED - Abnormal; Notable for the following components:   Potassium 3.4 (*)    BUN 30 (*)    Creatinine, Ser 2.00 (*)    Glucose, Bld 101 (*)    All other components  within normal limits  DIFFERENTIAL  ETHANOL  COMPREHENSIVE METABOLIC PANEL  I-STAT TROPONIN, ED    EKG EKG Interpretation  Date/Time:  Thursday September 17 2017 22:40:12 EDT Ventricular Rate:  69 PR Interval:    QRS Duration: 103 QT Interval:  449 QTC Calculation: 481 R Axis:   -22 Text Interpretation:  Sinus rhythm LVH with secondary repolarization abnormality No acute changes Nonspecific ST and T wave abnormality Confirmed by Varney Biles [phone]) on 09/17/2017 11:05:44 PM   Radiology Ct Head Wo Contrast  Result Date: 09/18/2017 CLINICAL DATA:  57 year old male with ataxia.  Concern for stroke. EXAM: CT HEAD WITHOUT CONTRAST TECHNIQUE: Contiguous axial images were obtained from the base of the skull through the vertex without intravenous contrast. COMPARISON:  None. FINDINGS: Brain: The ventricles and sulci appropriate size for patient's age. The gray-white matter discrimination is preserved. There is no acute intracranial hemorrhage. No mass effect or midline shift. No extra-axial fluid collection. Vascular: No hyperdense vessel or unexpected calcification. Skull: Normal. Negative for fracture or focal lesion. Sinuses/Orbits: Mild mucoperiosteal thickening of paranasal sinuses. No air-fluid level. The mastoid air cells are clear Other: None IMPRESSION: Unremarkable noncontrast CT of the brain. Electronically Signed   By: Anner Crete M.D.   On: 09/18/2017 00:11    Procedures Procedures (including critical care time)  Medications Ordered in ED Medications - No data to display   Initial Impression / Assessment and Plan / ED Course  I have reviewed the triage vital signs and the nursing notes.  Pertinent labs & imaging results that were available during my care of the patient were reviewed by me and considered in my medical decision making (see chart for details).    57 year old male with history of CHF, paroxysmal A. fib, hypertension and PE on Xarelto comes in with chief  complaint of constant dizziness and nausea.  Patient's dizziness is described as disequilibrium.  His neurologic exam is nonfocal, NIH stroke scale is 0.  However, given that patient has constant dizziness with nausea, we will get an MRI to rule out a stroke.  Orthostatics are negative.  Patient  denies any diarrhea, vomiting or significant fluid loss.  He also denies any new medications.  If the MRI is negative patient can go home. Dr. April Palombo aware of the patient.  Final Clinical Impressions(s) / ED Diagnoses   Final diagnoses:  Dizziness  Nausea    ED Discharge Orders    None       Varney Biles, MD 09/18/17 [phone]

## 2017-10-19 ENCOUNTER — Ambulatory Visit (HOSPITAL_COMMUNITY)
Admission: RE | Admit: 2017-10-19 | Discharge: 2017-10-19 | Disposition: A | Discharge status: Home / Self Care | Payer: Managed Care, Other (non HMO) | Source: Ambulatory Visit | Attending: Internal Medicine | Admitting: Internal Medicine

## 2017-10-19 VITALS — BP 170/86 | HR 79 | Wt 377.4 lb

## 2017-10-19 DIAGNOSIS — I5032 Chronic diastolic (congestive) heart failure: Secondary | ICD-10-CM | POA: Insufficient documentation

## 2017-10-19 DIAGNOSIS — Z833 Family history of diabetes mellitus: Secondary | ICD-10-CM | POA: Diagnosis not present

## 2017-10-19 DIAGNOSIS — Z9989 Dependence on other enabling machines and devices: Secondary | ICD-10-CM

## 2017-10-19 DIAGNOSIS — M109 Gout, unspecified: Secondary | ICD-10-CM | POA: Diagnosis not present

## 2017-10-19 DIAGNOSIS — I480 Paroxysmal atrial fibrillation: Secondary | ICD-10-CM | POA: Insufficient documentation

## 2017-10-19 DIAGNOSIS — I5022 Chronic systolic (congestive) heart failure: Secondary | ICD-10-CM | POA: Diagnosis not present

## 2017-10-19 DIAGNOSIS — G4733 Obstructive sleep apnea (adult) (pediatric): Secondary | ICD-10-CM | POA: Insufficient documentation

## 2017-10-19 DIAGNOSIS — N184 Chronic kidney disease, stage 4 (severe): Secondary | ICD-10-CM | POA: Diagnosis not present

## 2017-10-19 DIAGNOSIS — Z86718 Personal history of other venous thrombosis and embolism: Secondary | ICD-10-CM | POA: Insufficient documentation

## 2017-10-19 DIAGNOSIS — Z79899 Other long term (current) drug therapy: Secondary | ICD-10-CM | POA: Insufficient documentation

## 2017-10-19 DIAGNOSIS — J449 Chronic obstructive pulmonary disease, unspecified: Secondary | ICD-10-CM | POA: Diagnosis not present

## 2017-10-19 DIAGNOSIS — Z86711 Personal history of pulmonary embolism: Secondary | ICD-10-CM | POA: Insufficient documentation

## 2017-10-19 DIAGNOSIS — Z8249 Family history of ischemic heart disease and other diseases of the circulatory system: Secondary | ICD-10-CM | POA: Diagnosis not present

## 2017-10-19 DIAGNOSIS — Z7951 Long term (current) use of inhaled steroids: Secondary | ICD-10-CM | POA: Diagnosis not present

## 2017-10-19 DIAGNOSIS — I10 Essential (primary) hypertension: Secondary | ICD-10-CM

## 2017-10-19 DIAGNOSIS — I130 Hypertensive heart and chronic kidney disease with heart failure and stage 1 through stage 4 chronic kidney disease, or unspecified chronic kidney disease: Secondary | ICD-10-CM | POA: Diagnosis not present

## 2017-10-19 DIAGNOSIS — Z800 Family history of malignant neoplasm of digestive organs: Secondary | ICD-10-CM | POA: Diagnosis not present

## 2017-10-19 DIAGNOSIS — Z7901 Long term (current) use of anticoagulants: Secondary | ICD-10-CM | POA: Insufficient documentation

## 2017-10-19 DIAGNOSIS — Z87891 Personal history of nicotine dependence: Secondary | ICD-10-CM | POA: Diagnosis not present

## 2017-10-19 DIAGNOSIS — I483 Typical atrial flutter: Secondary | ICD-10-CM | POA: Diagnosis not present

## 2017-10-19 DIAGNOSIS — Z6841 Body Mass Index (BMI) 40.0 and over, adult: Secondary | ICD-10-CM | POA: Insufficient documentation

## 2017-10-19 LAB — BASIC METABOLIC PANEL
Anion gap: 8 (ref 5–15)
BUN: 21 mg/dL — ABNORMAL HIGH (ref 6–20)
CALCIUM: 9 mg/dL (ref 8.9–10.3)
CO2: 31 mmol/L (ref 22–32)
Chloride: 105 mmol/L (ref 98–111)
Creatinine, Ser: 2.13 mg/dL — ABNORMAL HIGH (ref 0.61–1.24)
GFR calc non Af Amer: 33 mL/min — ABNORMAL LOW (ref >60)
GFR, EST AFRICAN AMERICAN: 38 mL/min — AB (ref >60)
Glucose, Bld: 185 mg/dL — ABNORMAL HIGH (ref 70–99)
Potassium: 3.3 mmol/L — ABNORMAL LOW (ref 3.5–5.1)
SODIUM: 144 mmol/L (ref 135–145)

## 2017-10-19 MED ORDER — HYDRALAZINE HCL 50 MG PO TABS: 75.0000 mg | ORAL_TABLET | Freq: Three times a day (TID) | ORAL | 11 refills | Status: DC

## 2017-10-19 NOTE — Patient Instructions (Signed)
INCREASE Hydralazine to 75 mg (1.5 tabs) three times daily (once every 8 hours). Try using alarms on smart phone as a reminder if taking all 3 doses each day becomes difficult.  Routine lab work today. Will notify you of abnormal results, otherwise no news is good news!  Avoid NSAIDs.  Return in 2 weeks for nurse visit to recheck blood pressure.  Follow up 3 months.  Take all medication as prescribed the day of your appointment. Bring all medications with you to your appointment.  Do the following things EVERYDAY: 1) Weigh yourself in the morning before breakfast. Write it down and keep it in a log. 2) Take your medicines as prescribed 3) Eat low salt foods-Limit salt (sodium) to 2000 mg per day.  4) Stay as active as you can everyday 5) Limit all fluids for the day to less than 2 liters

## 2017-10-19 NOTE — Progress Notes (Signed)
Advanced Heart Failure Clinic Note   Referring Physician: [name] Primary Care: Silvio Pate Primary Cardiologist: [name]  EP: Dr Rayann Heman HF MD: Dr Haroldine Laws   HPI: Phillip [name] is a 57 y.o. male hx of PAF, remote PE, hx of DVT, OSA, morbid obesity, CKD Stage III,  HTN, and  combined chronic systolic/diastolic. S/P A Flutter ablation May 2019 .    Echo 12/21/16 with EF 55-60% G2DD, mild aortic regurgitation. Last nuc 2008 no ischemia, no hx of cath.   He has had multiple admissions to the hospital for volume overload/acute on chronic diastolic heart failure. Last admit in 12/18 d/c weight was 368. Lowest weight in last few month recorded 353.   Today he returns for HF follow up. Overall feeling fine. Denies SOB/PND/Orthopnea. Using CPAP nightly. Appetite ok. No fever or chills. Weight at home 375-378 pounds. Taking all medications. Taking 4 advil a week for joint pain. 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   Review of systems complete and found to be negative unless listed in HPI.    Past Medical History:  Diagnosis Date  . CHF (congestive heart failure) ([place]) 11/15   EF 40-45%, improved with follow-up  . COPD (chronic obstructive pulmonary disease) ([place])   . Gout   . History of DVT (deep vein thrombosis)   . Hypertension   . Hypertensive cardiovascular disease   . Kidney dysfunction   . Morbid obesity ([place])   . OSA on CPAP   . Paroxysmal atrial fibrillation ([facility])   . PNA (pneumonia)   . Pulmonary embolism ([place]) 2010   left leg DVT   Current Outpatient Medications  Medication Sig Dispense Refill  . albuterol (PROVENTIL HFA;VENTOLIN HFA) 108 (90 Base) MCG/ACT inhaler Inhale 2 puffs into the lungs every 4 (four) hours as needed for wheezing or shortness of breath. 1 Inhaler 0  . albuterol (PROVENTIL) (2.5 MG/3ML) 0.083% nebulizer solution Take 3 mLs (2.5 mg total) by nebulization every 6 (six) hours as needed for wheezing or shortness of breath. 360 mL 11  .  allopurinol (ZYLOPRIM) 300 MG tablet Take 1 tablet (300 mg total) daily by mouth. 90 tablet 3  . budesonide-formoterol (SYMBICORT) 80-4.5 MCG/ACT inhaler Inhale 2 puffs 2 (two) times daily into the lungs. 3 Inhaler 3  . carvedilol (COREG) 6.25 MG tablet Take 1 tablet (6.25 mg total) by mouth 2 (two) times daily with a meal. 60 tablet 11  . cloNIDine (CATAPRES) 0.1 MG tablet TAKE 1 TABLET BY MOUTH THREE TIMES A DAY 90 tablet 3  . colchicine 0.6 MG tablet Take 1 tablet (0.6 mg total) by mouth daily. (Patient taking differently: Take 0.6 mg by mouth daily as needed (gout). ) 30 tablet 0  . hydrALAZINE (APRESOLINE) 50 MG tablet Take 1.5 tablets (75 mg total) by mouth 3 (three) times daily. 135 tablet 11  . montelukast (SINGULAIR) 10 MG tablet Take 1 tablet (10 mg total) by mouth at bedtime. 30 tablet 11  . naproxen sodium (ALEVE) 220 MG tablet Take 440 mg by mouth daily as needed (pain).    . OXYGEN Inhale 2 L into the lungs daily. 2lpm when needed per pt     . oxymetazoline (AFRIN NASAL SPRAY) 0.05 % nasal spray Place 1 spray into both nostrils 2 (two) times daily as needed for congestion.    . potassium chloride SA (K-DUR,KLOR-CON) 20 MEQ tablet Take 2 tablets (40 mEq total) by mouth daily. 60 tablet 3  . rivaroxaban (XARELTO) 20 MG TABS  tablet Take 1 tablet (20 mg total) by mouth daily with supper. 90 tablet 2  . torsemide (DEMADEX) 20 MG tablet Take 3 tablets (60 mg total) by mouth 2 (two) times daily. 540 tablet 3  . UNABLE TO FIND Med Name: CPAP with sleep     No current facility-administered medications for this encounter.    No Known Allergies  Social History   Socioeconomic History  . Marital status: Divorced    Spouse name: Not on file  . Number of children: 2  . Years of education: Not on file  . Highest education level: Not on file  Occupational History  . 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 Occupation: Airline pilot    Comment: Disabled  Social Needs  . Financial resource strain:  Not on file  . Food insecurity:    Worry: Not on file    Inability: Not on file  . Transportation needs:    Medical: Not on file    Non-medical: Not on file  Tobacco Use  . Smoking status: Former Smoker    Packs/day: 0.25    Years: 1.00    Pack years: 0.25    Types: Cigarettes    Last attempt to quit: 07/23/1989    Years since quitting: 28.2  . Smokeless tobacco: Never Used  Substance and Sexual Activity  . Alcohol use: No    Alcohol/week: 0.0 oz  . Drug use: No  . Sexual activity: Not Currently  Lifestyle  . Physical activity:    Days per week: Not on file    Minutes per session: Not on file  . Stress: Not on file  Relationships  . Social connections:    Talks on phone: Not on file    Gets together: Not on file    Attends religious service: Not on file    Active member of club or organization: Not on file    Attends meetings of clubs or organizations: Not on file    Relationship status: Not on file  . Intimate partner violence:    Fear of current or ex partner: Not on file    Emotionally abused: Not on file    Physically abused: Not on file    Forced sexual activity: Not on file  Other Topics Concern  . Not on file  Social History Narrative   Pt lives in [place], alone.   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   Has 2 sons in [place]      No living will   Would want sons to make decisions for him   Would accept resuscitation    Family History  Problem Relation Age of Onset  . Hypertension Mother   . Diabetes Father   . CAD Father   . Stomach cancer Father   . Clotting disorder Father   . Heart disease Father        transplant  . Allergies Brother    Vitals:   10/19/17 1131  BP: (!) 170/86  Pulse: 79  SpO2: 98%  Weight: (!) 377 lb 6.4 oz (171.2 kg)   Wt Readings from Last 3 Encounters:  10/19/17 (!) 377 lb 6.4 oz (171.2 kg)  09/17/17 (!) 381 lb (172.8 kg)  08/19/17 (!) 379 lb 9.6 oz (172.2 kg)   PHYSICAL  EXAM: ASSESSMENT & PLAN: General:  Well appearing. No resp difficulty HEENT: normal Neck: supple. no JVD. Carotids 2+ bilat; no bruits. No lymphadenopathy or thryomegaly appreciated. Cor: PMI nondisplaced.  Regular rate & rhythm. No rubs, gallops or murmurs. Lungs: clear Abdomen: obese, soft, nontender, nondistended. No hepatosplenomegaly. No bruits or masses. Good bowel sounds. Extremities: no cyanosis, clubbing, rash, edema Neuro: alert & orientedx3, cranial nerves grossly intact. moves all 4 extremities w/o difficulty. Affect pleasant   1. Chronic diastolic HF - Echo 04/9935 LVEF 55-60%, Grade 2 DD.  NYHA II. Volume status stable. Continue torsemide 60 mg BID. Can take 80 BID as needed.  - Continue spiro 25 mg daily.   2. PAF / Typical A-flutter, paroxysmal - S/P AFL ablation 07/2017. Maintaining NSR.  - Continue Xarelto 20 mg daily.   3. Hypertension - Somewhat elevated this am. Forgot his meds. Has been stable at home and other appointments. Continue same meds for now with upcoming procedure.  - Stable at [place] 07/01/17.  -elevated. Increase hydralazine 75 three times a day.  - Continue coreg to 6.25 mg BID.  - Continue spiro 25 mg daily.   6. Morbid obesity - Body mass index is 55.73 kg/m.  - Discussed portion control.     5. CKD IV Check BMET today Discussed avoiding NSAIDs.   6. OSA on CPAP -Continue CPAP   7. Screening - His EF was normal in September.  - He is OK for colonoscopy with low/moderate risk for peri-operative complications due to his body habitus and OSA.    Follow up in 2 weeks for nursing BP check  Follow up in 3 months for a visit.    Darrick Grinder, NP  12:10 PM

## 2017-10-22 ENCOUNTER — Telehealth: Payer: Self-pay | Admitting: Internal Medicine

## 2017-10-22 MED ORDER — COLCHICINE 0.6 MG PO TABS: 0.6000 mg | ORAL_TABLET | Freq: Every day | ORAL | 1 refills | Status: DC | PRN

## 2017-10-22 NOTE — Telephone Encounter (Signed)
Rx sent electronically.  

## 2017-10-22 NOTE — Telephone Encounter (Signed)
Patient called and said he called [facility] [place].  [facility] told [facility]'t [facility] and they couldn't contact us.  Patient is requesting a refill on [place] and please call it in to [facility].

## 2017-10-29 ENCOUNTER — Telehealth (HOSPITAL_COMMUNITY): Payer: Self-pay

## 2017-10-29 DIAGNOSIS — I5022 Chronic systolic (congestive) heart failure: Secondary | ICD-10-CM

## 2017-10-29 NOTE — Telephone Encounter (Signed)
Result Notes for Basic metabolic panel   Notes recorded by Effie Berkshire, RN on 10/29/2017 at 3:23 PM EDT Patient reports he has missed a few doses the past week or two. Educated patient on importance of med compliance. Coming in Monday for BP check. Will plan to repeat bmet at that time. ------  Notes recorded by Darrick Grinder D, NP on 10/19/2017 at 4:04 PM EDT Potassium low Increased potassium to 40 meq twice a day. Renal function ok. Please call.

## 2017-11-02 ENCOUNTER — Ambulatory Visit (HOSPITAL_COMMUNITY)
Admission: RE | Admit: 2017-11-02 | Discharge: 2017-11-02 | Disposition: A | Discharge status: Home / Self Care | Payer: Managed Care, Other (non HMO) | Source: Ambulatory Visit | Attending: Cardiology | Admitting: Cardiology

## 2017-11-02 DIAGNOSIS — Z79899 Other long term (current) drug therapy: Secondary | ICD-10-CM | POA: Insufficient documentation

## 2017-11-02 DIAGNOSIS — I10 Essential (primary) hypertension: Secondary | ICD-10-CM | POA: Insufficient documentation

## 2017-11-02 DIAGNOSIS — Z0489 Encounter for examination and observation for other specified reasons: Secondary | ICD-10-CM | POA: Insufficient documentation

## 2017-11-02 NOTE — Progress Notes (Signed)
Pt was seen today for a blood pressure check. During his last office visit 10/19/17 his bp was elevated (170/86). Amy increased Hydralazine to 75mg  tid (see note below). Pt took medication 34min prior to appt. Per Caryl Pina continue current medication regimen. Pt aware and agreeable with plan.

## 2017-12-07 ENCOUNTER — Other Ambulatory Visit: Payer: Self-pay | Admitting: Internal Medicine

## 2017-12-08 ENCOUNTER — Other Ambulatory Visit: Payer: Self-pay | Admitting: Internal Medicine

## 2017-12-17 ENCOUNTER — Ambulatory Visit: Payer: Managed Care, Other (non HMO) | Admitting: Internal Medicine

## 2017-12-17 ENCOUNTER — Encounter: Payer: Self-pay | Admitting: Internal Medicine

## 2017-12-17 VITALS — BP 118/80 | HR 60 | Temp 98.0°F | Ht 69.0 in | Wt 379.0 lb

## 2017-12-17 DIAGNOSIS — J4530 Mild persistent asthma, uncomplicated: Secondary | ICD-10-CM | POA: Diagnosis not present

## 2017-12-17 DIAGNOSIS — J00 Acute nasopharyngitis [common cold]: Secondary | ICD-10-CM

## 2017-12-17 MED ORDER — AMOXICILLIN-POT CLAVULANATE 875-125 MG PO TABS: 1.0000 | ORAL_TABLET | Freq: Two times a day (BID) | ORAL | 0 refills | Status: AC

## 2017-12-17 MED ORDER — PREDNISONE 10 MG PO TABS: ORAL_TABLET | ORAL | 0 refills | Status: DC

## 2017-12-17 NOTE — Progress Notes (Signed)
Subjective:     Patient ID: Phillip [name], male   DOB: 18-May-1960,     MRN: [id number]   Brief patient profile:  57  yobm no significant  smoking hx / played LB at semi pro level  at wt 240 and maintained that wt of < 260 up until retired from [employer] in 1999 with smoke exp in [place] at "79% lung capacity" per pulmonologist on prn saba with progressive wt gain since then assoc with progessive doe so self referred to pulmonary clinic 07/24/2015        History of Present Illness   09/30/2016  Acute ov/Dayshia Ballinas re: chronic asthma  symbicort 80  2bid / 02 2lpm hs  Chief Complaint  Patient presents with  . Acute Visit    Pt states every morning he hears a rattling in his chest, still having the sob while walking, still has an occ. cough but mainly non productive, only has chest tightness when he exerts himself, he states he does has some chest conegstion Denies fever, pt unsure if his recent trip to [place] has anything to do with his symtpoms   worse x 3 weeks but better since in ER with dx with chf 09/15/16 and sleeping fine on cpap  Much better p lasix Some better after saba but mostly using neb and skipping hfa saba in action paln  Minimal rattling better p extra dose of diuretic rec Plan A = Automatic = Symbicort 80 Take 2 puffs first thing in am and then another 2 puffs about 12 hours later.  Plan B = Backup Only use your albuterol as a rescue medication to be used if you can't catch your breath Plan C = Crisis - only use your albuterol nebulizer if you first try Plan B           12/16/2016  f/u ov/[name] re: symbicort 80 plus prn sabas/ no 02 at all  Chief Complaint  Patient presents with  . Follow-up    Increased wheezing in the am x 2 months. He has been using his albuterol inhaler 2 x per day on average. He rarely uses his neb. He had some prod cough with yellow sputum 1 wk ago.    Off singulair since 03/2016 overall worse since but sleeps fine on cpap s 02  - s cpap wakes up gagging and  "wheezing" rec Restart singulair 10 mg each pm   Please see patient coordinator before you leave today  to schedule Internal medicine/ [facility]      06/16/2017  f/u ov/[name] re:  Asthma / MO Chief Complaint  Patient presents with  . Follow-up    Breathing is overall doing well. He had some bloody nasal d/c 1 day ago. He rarely uses his albuterol inhaler or neb.    Dyspnea:  More limited by knees  Cough: minimal  Sleep: on cpap no 02 SABA use:  Rarely Meds: not able to confirm any but supposed to be on symb 80 2bid / singulair  rec No change rx    12/17/2017  f/u ov/[name] re: asthma  Chief Complaint  Patient presents with  . Follow-up    he has been coughing for the past 2 wks- prod with yellow to brown sputum.  He has also noticed some minimal SOB and wheezing. He has not used his albuterol inhaler or neb.   Dyspnea:  Treadmill x 15 min at 25mph/ slt incline " feels good"  Cough:starting x 2 weeks prior to OV  =  clearing throat a lot, chewing mint gum / mucus min vol but discolored assoc with nasal congestion  Sleeping: cpap no 02  SABA use: rare 02: has concentrator not using    No obvious day to day or daytime variability or assoc  mucus plugs or hemoptysis or cp or chest tightness, subjective wheeze or overt sinus or hb symptoms.   Sleeping as above flat bed  without nocturnal  or early am exacerbation  of respiratory  c/o's or need for noct saba. Also denies any obvious fluctuation of symptoms with weather or environmental changes or other aggravating or alleviating factors except as outlined above   No unusual exposure hx or h/o childhood pna/ asthma or knowledge of premature birth.  Current Allergies, Complete Past Medical History, Past Surgical History, Family History, and Social History were reviewed in [employer] record.  ROS  The following are not active complaints unless bolded Hoarseness, sore throat, dysphagia, dental problems, itching,  sneezing,  nasal congestion or discharge of excess mucus or purulent secretions, ear ache,   fever, chills, sweats, unintended wt loss or wt gain, classically pleuritic or exertional cp,  orthopnea pnd or arm/hand swelling  or leg swelling, presyncope, palpitations, abdominal pain, anorexia, nausea, vomiting, diarrhea  or change in bowel habits or change in bladder habits, change in stools or change in urine, dysuria, hematuria,  rash, arthralgias, visual complaints, headache, numbness, weakness or ataxia or problems with walking or coordination,  change in mood or  memory.        Current Meds  Medication Sig  . albuterol (PROVENTIL HFA;VENTOLIN HFA) 108 (90 Base) MCG/ACT inhaler Inhale 2 puffs into the lungs every 4 (four) hours as needed for wheezing or shortness of breath.  [name] albuterol (PROVENTIL) (2.5 MG/3ML) 0.083% nebulizer solution Take 3 mLs (2.5 mg total) by nebulization every 6 (six) hours as needed for wheezing or shortness of breath.  . allopurinol (ZYLOPRIM) 300 MG tablet Take 1 tablet (300 mg total) daily by mouth.  . budesonide-formoterol (SYMBICORT) 80-4.5 MCG/ACT inhaler Inhale 2 puffs 2 (two) times daily into the lungs.  . carvedilol (COREG) 6.25 MG tablet Take 1 tablet (6.25 mg total) by mouth 2 (two) times daily with a meal.  . cloNIDine (CATAPRES) 0.1 MG tablet TAKE 1 TABLET BY MOUTH THREE TIMES A DAY  . colchicine 0.6 MG tablet Take 1 tablet (0.6 mg total) by mouth daily as needed (gout).  . hydrALAZINE (APRESOLINE) 50 MG tablet Take 1.5 tablets (75 mg total) by mouth 3 (three) times daily.  . montelukast (SINGULAIR) 10 MG tablet TAKE 1 TABLET BY MOUTH EVERYDAY AT BEDTIME  . naproxen sodium (ALEVE) 220 MG tablet Take 440 mg by mouth daily as needed (pain).  . OXYGEN Inhale 2 L into the lungs daily. 2lpm when needed per pt   . oxymetazoline (AFRIN NASAL SPRAY) 0.05 % nasal spray Place 1 spray into both nostrils 2 (two) times daily as needed for congestion.  . potassium chloride  SA (K-DUR,KLOR-CON) 20 MEQ tablet Take 2 tablets (40 mEq total) by mouth daily.  . rivaroxaban (XARELTO) 20 MG TABS tablet Take 1 tablet (20 mg total) by mouth daily with supper.  . torsemide (DEMADEX) 20 MG tablet Take 3 tablets (60 mg total) by mouth 2 (two) times daily.  [name] UNABLE TO FIND Med Name: CPAP with sleep         Objective:   Physical Exam  Obese bm nad    12/17/2017  379  06/16/2017          374  03/09/2017      376  12/16/2016        372  09/30/2016        358  07/21/2016        362  04/22/2016        374  01/21/2016      390  12/27/2015        387   09/10/2015       372   07/24/15 389 lb 6.4 oz (176.631 kg)  07/08/15 384 lb 3.2 oz (174.272 kg)  06/21/15 390 lb 8 oz (177.13 kg)      Vital signs reviewed - Note on arrival 02 sats  99% on RA        HEENT: nl dentition, turbinates bilaterally, and oropharynx. Nl external ear canals without cough reflex   NECK :  without JVD/Nodes/TM/ nl carotid upstrokes bilaterally   LUNGS: no acc muscle use,  Nl contour chest with distant bs  bilaterally without cough on insp or exp maneuvers   CV:  RRR  no s3 or murmur or increase in P2, and  Trace bilateral sym ankle pitting   ABD:  soft and nontender with nl inspiratory excursion in the supine position. No bruits or organomegaly appreciated, bowel sounds nl  MS:  Nl gait/ ext warm without deformities, calf tenderness, cyanosis or clubbing No obvious joint restrictions   SKIN: warm and dry without lesions    NEURO:  alert, approp, nl sensorium with  no motor or cerebellar deficits apparent.                Assessment:

## 2017-12-17 NOTE — Patient Instructions (Addendum)
Exercise is minimum of 30 min every days where you are short of breath never out of breath   Augmentin 875 mg take one pill twice daily  X 10 days - take at breakfast and supper with large glass of water.  It would help reduce the usual side effects (diarrhea and yeast infections) if you ate cultured yogurt at lunch.   Prednisone 10 mg take  4 each am x 2 days,   2 each am x 2 days,  1 each am x 2 days and stop   GERD (REFLUX)  is an extremely common cause of respiratory symptoms just like yours , many times with no obvious heartburn at all.    It can be treated with medication, but also with lifestyle changes including elevation of the head of your bed (ideally with 6 inch  bed blocks),  Smoking cessation, avoidance of late meals, excessive alcohol, and avoid fatty foods, chocolate, peppermint, colas, red wine, and acidic juices such as orange juice.  NO MINT OR MENTHOL PRODUCTS SO NO COUGH DROPS  USE SUGARLESS CANDY INSTEAD (Jolley ranchers or Stover's or Life Savers) or even ice chips will also do - the key is to swallow to prevent all throat clearing. NO OIL BASED VITAMINS - use powdered substitutes.     Please schedule a follow up office visit in 6 months, call sooner if needed

## 2017-12-18 ENCOUNTER — Encounter: Payer: Self-pay | Admitting: Internal Medicine

## 2017-12-18 NOTE — Assessment & Plan Note (Signed)
singulair maint rx as of 09/10/2015 with NO = 31 / no saba or other controller needed  - admit 12/14/15 with flare ? Assoc with chf/ cap - PFT's  01/21/2016  FEV1 2.11 (71 % ) ratio 84  p 12 % improvement from saba p breo 100 and on last day of pred taper  prior to study with DLCO  73/82  % corrects to 120 % for alv volume  - 01/21/2016    change to symbicort  160 2bid  - FENO 04/22/2016  =   16 on symb 160 with sub optimal technique and singuair > d/c'd singulair  - 04/22/2016    try symbicort 80 2bid - Allergy profile  09/30/16  >  Eos 0.0 /  IgE 7 RAST neg    - restart singulair 10 mg each am 12/16/2016 >>>  - 12/17/2017  After extensive coaching inhaler device,  effectiveness =    90%    Despite mild flare of cough, All goals of chronic asthma control met including optimal function and elimination of symptoms with minimal need for rescue therapy.  Contingencies discussed in full including contacting this office immediately if not controlling the symptoms using the rule of two's.

## 2017-12-18 NOTE — Assessment & Plan Note (Signed)
ERV  01/21/2016  = 24% c/w body habitus  Body mass index is 55.97 kg/m.  -  trending up still  Lab Results  Component Value Date   TSH 1.77 03/23/2017     Contributing to gerd risk/ doe/reviewed the need and the process to achieve and maintain neg calorie balance- see avs for instructions unique to this ov    > defer f/u primary care including intermittently monitoring thyroid status     I had an extended discussion with the patient reviewing all relevant studies completed to date and  lasting 15 to 20 minutes of a 25 minute visit    See device teaching which extended face to face time for this visit.  Each maintenance medication was reviewed in detail including emphasizing most importantly the difference between maintenance and prns and under what circumstances the prns are to be triggered using an action plan format that is not reflected in the computer generated alphabetically organized AVS which I have not found useful in most complex patients, especially with respiratory illnesses  Please see AVS for specific instructions unique to this visit that I personally wrote and verbalized to the the pt in detail and then reviewed with pt  by my nurse highlighting any  changes in therapy recommended at today's visit to their plan of care.

## 2017-12-18 NOTE — Assessment & Plan Note (Signed)
rx with augmentin x 10 days and observe gerd diet (no mint ) then add gerd rx if not all better and return here if not  O/w f/u can be q 6 m

## 2017-12-21 ENCOUNTER — Encounter: Payer: Self-pay | Admitting: Podiatry

## 2017-12-21 ENCOUNTER — Ambulatory Visit: Payer: Managed Care, Other (non HMO) | Admitting: Podiatry

## 2017-12-21 DIAGNOSIS — M79676 Pain in unspecified toe(s): Secondary | ICD-10-CM | POA: Diagnosis not present

## 2017-12-21 DIAGNOSIS — B351 Tinea unguium: Secondary | ICD-10-CM | POA: Diagnosis not present

## 2017-12-21 NOTE — Progress Notes (Signed)
   SUBJECTIVE Patient presents to office today complaining of elongated, thickened nails that cause pain while ambulating in shoes.  He is unable to trim his own nails. Patient is here for further evaluation and treatment.  Past Medical History:  Diagnosis Date  . CHF (congestive heart failure) ([facility]) 11/15   EF 40-45%, improved with follow-up  . COPD (chronic obstructive pulmonary disease) ([place])   . Gout   . History of DVT (deep vein thrombosis)   . Hypertension   . Hypertensive cardiovascular disease   . Kidney dysfunction   . Morbid obesity ([place])   . OSA on CPAP   . Paroxysmal atrial fibrillation ([facility])   . PNA (pneumonia)   . Pulmonary embolism ([place]) 2010   left leg DVT    OBJECTIVE General Patient is awake, alert, and oriented x 3 and in no acute distress. Derm Skin is dry and supple bilateral. Negative open lesions or macerations. Remaining integument unremarkable. Nails are tender, long, thickened and dystrophic with subungual debris, consistent with onychomycosis, 1-5 bilateral. No signs of infection noted. Vasc  DP and PT pedal pulses palpable bilaterally. Temperature gradient within normal limits.  Neuro Epicritic and protective threshold sensation grossly intact bilaterally.  Musculoskeletal Exam No symptomatic pedal deformities noted bilateral. Muscular strength within normal limits.  ASSESSMENT 1. Onychodystrophic nails 1-5 bilateral with hyperkeratosis of nails.  2. Onychomycosis of nail due to dermatophyte bilateral 3. Pain in foot bilateral  PLAN OF CARE 1. Patient evaluated today.  2. Instructed to maintain good pedal hygiene and foot care.  3. Mechanical debridement of nails 1-5 bilaterally performed using a nail nipper. Filed with dremel without incident.  4. Return to clinic in 3 mos.    Edrick Kins, DPM [facility]  Dr. Edrick Kins, [address]                                        [place], [place] [address]                 Office [phone]  Fax [phone]

## 2018-01-06 ENCOUNTER — Other Ambulatory Visit: Payer: Self-pay | Admitting: Internal Medicine

## 2018-01-06 ENCOUNTER — Other Ambulatory Visit (HOSPITAL_COMMUNITY): Payer: Self-pay | Admitting: Student

## 2018-01-06 DIAGNOSIS — E876 Hypokalemia: Secondary | ICD-10-CM

## 2018-01-13 ENCOUNTER — Encounter (HOSPITAL_COMMUNITY): Payer: Self-pay

## 2018-01-13 ENCOUNTER — Emergency Department (HOSPITAL_COMMUNITY)
Admission: EM | Admit: 2018-01-13 | Discharge: 2018-01-13 | Disposition: A | Discharge status: Home / Self Care | Payer: Managed Care, Other (non HMO) | Attending: Emergency Medicine | Admitting: Emergency Medicine

## 2018-01-13 ENCOUNTER — Emergency Department (HOSPITAL_COMMUNITY): Payer: Managed Care, Other (non HMO)

## 2018-01-13 ENCOUNTER — Other Ambulatory Visit: Payer: Self-pay

## 2018-01-13 DIAGNOSIS — N184 Chronic kidney disease, stage 4 (severe): Secondary | ICD-10-CM | POA: Diagnosis not present

## 2018-01-13 DIAGNOSIS — I130 Hypertensive heart and chronic kidney disease with heart failure and stage 1 through stage 4 chronic kidney disease, or unspecified chronic kidney disease: Secondary | ICD-10-CM | POA: Insufficient documentation

## 2018-01-13 DIAGNOSIS — Z87891 Personal history of nicotine dependence: Secondary | ICD-10-CM | POA: Diagnosis not present

## 2018-01-13 DIAGNOSIS — Z7901 Long term (current) use of anticoagulants: Secondary | ICD-10-CM | POA: Insufficient documentation

## 2018-01-13 DIAGNOSIS — I480 Paroxysmal atrial fibrillation: Secondary | ICD-10-CM | POA: Diagnosis not present

## 2018-01-13 DIAGNOSIS — Z79899 Other long term (current) drug therapy: Secondary | ICD-10-CM | POA: Diagnosis not present

## 2018-01-13 DIAGNOSIS — I5032 Chronic diastolic (congestive) heart failure: Secondary | ICD-10-CM | POA: Insufficient documentation

## 2018-01-13 DIAGNOSIS — I509 Heart failure, unspecified: Secondary | ICD-10-CM

## 2018-01-13 DIAGNOSIS — R0600 Dyspnea, unspecified: Secondary | ICD-10-CM | POA: Diagnosis present

## 2018-01-13 DIAGNOSIS — J449 Chronic obstructive pulmonary disease, unspecified: Secondary | ICD-10-CM | POA: Insufficient documentation

## 2018-01-13 LAB — BASIC METABOLIC PANEL
Anion gap: 8 (ref 5–15)
BUN: 32 mg/dL — ABNORMAL HIGH (ref 6–20)
CALCIUM: 9.4 mg/dL (ref 8.9–10.3)
CHLORIDE: 105 mmol/L (ref 98–111)
CO2: 31 mmol/L (ref 22–32)
CREATININE: 2.21 mg/dL — AB (ref 0.61–1.24)
GFR calc non Af Amer: 32 mL/min — ABNORMAL LOW (ref >60)
GFR, EST AFRICAN AMERICAN: 37 mL/min — AB (ref >60)
Glucose, Bld: 95 mg/dL (ref 70–99)
Potassium: 3.7 mmol/L (ref 3.5–5.1)
Sodium: 144 mmol/L (ref 135–145)

## 2018-01-13 LAB — CBC
HEMATOCRIT: 42.5 % (ref 39.0–52.0)
Hemoglobin: 12.8 g/dL — ABNORMAL LOW (ref 13.0–17.0)
MCH: 27.4 pg (ref 26.0–34.0)
MCHC: 30.1 g/dL (ref 30.0–36.0)
MCV: 91 fL (ref 80.0–100.0)
NRBC: 0 % (ref 0.0–0.2)
Platelets: 170 10*3/uL (ref 150–400)
RBC: 4.67 MIL/uL (ref 4.22–5.81)
RDW: 15 % (ref 11.5–15.5)
WBC: 4.3 10*3/uL (ref 4.0–10.5)

## 2018-01-13 LAB — POCT I-STAT TROPONIN I: Troponin i, poc: 0.04 ng/mL (ref 0.00–0.08)

## 2018-01-13 LAB — BRAIN NATRIURETIC PEPTIDE: B Natriuretic Peptide: 420 pg/mL — ABNORMAL HIGH (ref 0.0–100.0)

## 2018-01-13 MED ORDER — FUROSEMIDE 10 MG/ML IJ SOLN
60.0000 mg | Freq: Once | INTRAMUSCULAR | Status: AC
  Administered 2018-01-13: 60 mg via INTRAVENOUS
  Filled 2018-01-13: qty 8

## 2018-01-13 NOTE — ED Triage Notes (Addendum)
Pt reports a hx of CHF and states that 3-4 days ago he started experiencing chest tightness and leg swelling. He reports taking an extra fluid pill without relief. He states that he wants to get ahead of his CHF before it gets too bad. A&Ox4. Ambulatory.

## 2018-01-13 NOTE — ED Provider Notes (Signed)
[facility] DEPT Provider Note   CSN: [id number] Arrival date & time: 01/13/18  1545     History   Chief Complaint Chief Complaint  Patient presents with  . Chest Pain  . Leg Swelling    HPI Phillip [name] is a 57 y.o. male presenting for evaluation of chest tightness, leg swelling, and dyspnea on exertion.  Patient states for the past 2 days, he has been having increasing dyspnea on exertion.  He feels like his bilateral legs are becoming swollen.  He reports mild chest tightness and a mild nonproductive cough.  He states the symptoms are consistent with early CHF exacerbation.  He took an extra dose of his torsemide this morning, but had no improvement of his symptoms.  He has chronic orthopnea, has not needed to increase his number of pillows.  Denies fevers, chills, chest pain, nausea, vomiting, abdominal pain, urinary symptoms, normal bowel movements.  He follows with Dr. Ronna Polio at the heart failure clinic.  Last visit was in June, everything was normal.  Patient does not weigh himself daily.  He has had his home medications this morning, but has not had his night medications.  HPI  Past Medical History:  Diagnosis Date  . CHF (congestive heart failure) ([place]) 11/15   EF 40-45%, improved with follow-up  . COPD (chronic obstructive pulmonary disease) ([place])   . Gout   . History of DVT (deep vein thrombosis)   . Hypertension   . Hypertensive cardiovascular disease   . Kidney dysfunction   . Morbid obesity ([place])   . OSA on CPAP   . Paroxysmal atrial fibrillation (H[facility])   . PNA (pneumonia)   . Pulmonary embolism ([place]) 2010   left leg DVT    Patient Active Problem List   Diagnosis Date Noted  . Atrial flutter ([place]) 06/22/2017  . Anemia 03/23/2017  . Acute rhinitis 03/10/2017  . CHF (congestive heart failure) ([place]) 03/10/2017  . Asthma with COPD ([place])   . Chronic diastolic heart failure ([place])   . Long term (current) use of anticoagulants  [Z79.01] 08/17/2015  . Hx pulmonary embolism 08/17/2015  . Deep vein thrombosis (DVT) of lower extremity ([place]) 08/17/2015  . Paroxysmal atrial fibrillation ([place]) 08/04/2015  . SVT (supraventricular tachycardia) ([place]) 06/22/2015  . Dyspnea on exertion 06/20/2015  . Morbid obesity due to excess calories ([place]) 03/07/2015  . Elevated troponin 02/20/2015  . Essential hypertension   . Gout   . Mild persistent asthma in adult without complication 66/44/0347  . OSA on CPAP 05/06/2014  . Chronic kidney disease, stage IV (severe) ([place]) 05/06/2014  . Asthma with acute exacerbation 05/06/2014    Past Surgical History:  Procedure Laterality Date  . A-FLUTTER ABLATION N/A 07/22/2017   Procedure: A-FLUTTER ABLATION;  Surgeon: Thompson Grayer, MD;  Location: [facility];  Service: Cardiovascular;  Laterality: N/A;  . [place] Medications    Prior to Admission medications   Medication Sig Start Date End Date Taking? Authorizing Provider  allopurinol (ZYLOPRIM) 300 MG tablet TAKE 1 TABLET (300 MG TOTAL) DAILY BY MOUTH. 01/07/18  Yes [name], MD  budesonide-formoterol (SYMBICORT) 80-4.5 MCG/ACT inhaler Inhale 2 puffs 2 (two) times daily into the lungs. 02/06/17  Yes Tanda Rockers, MD  carvedilol (COREG) 6.25 MG tablet Take 1 tablet (6.25 mg total) by mouth 2 (two) times daily with a meal. 06/22/17  Yes Shirley Friar, PA-C  cloNIDine (CATAPRES) 0.1 MG tablet TAKE 1 TABLET BY MOUTH THREE TIMES A DAY 12/08/17  Yes [name], MD  hydrALAZINE (APRESOLINE) 50 MG tablet Take 1.5 tablets (75 mg total) by mouth 3 (three) times daily. 10/19/17  Yes Clegg, Amy D, NP  KLOR-CON M20 20 MEQ tablet TAKE 2 TABLETS (40 MEQ TOTAL) BY MOUTH DAILY. Patient taking differently: Take 40 mEq by mouth once.  01/06/18  Yes Shirley Friar, PA-C  montelukast (SINGULAIR) 10 MG tablet TAKE 1 TABLET BY MOUTH EVERYDAY AT BEDTIME Patient taking differently:  Take 10 mg by mouth at bedtime.  12/07/17  Yes Tanda Rockers, MD  naproxen sodium (ALEVE) 220 MG tablet Take 440 mg by mouth daily as needed (pain).   Yes [provider]  rivaroxaban (XARELTO) 20 MG TABS tablet Take 1 tablet (20 mg total) by mouth daily with supper. 07/28/17  Yes Allred, Jeneen Rinks, MD  torsemide (DEMADEX) 20 MG tablet Take 3 tablets (60 mg total) by mouth 2 (two) times daily. 01/20/17 01/15/18 Yes Belva Crome, MD  UNABLE TO FIND Med Name: CPAP with sleep   Yes [provider]  albuterol (PROVENTIL HFA;VENTOLIN HFA) 108 (90 Base) MCG/ACT inhaler Inhale 2 puffs into the lungs every 4 (four) hours as needed for wheezing or shortness of breath. Patient not taking: Reported on 01/13/2018 05/11/17   Shirley Friar, PA-C  albuterol (PROVENTIL) (2.5 MG/3ML) 0.083% nebulizer solution Take 3 mLs (2.5 mg total) by nebulization every 6 (six) hours as needed for wheezing or shortness of breath. Patient not taking: Reported on 01/13/2018 09/12/16   Tanda Rockers, MD  colchicine 0.6 MG tablet Take 1 tablet (0.6 mg total) by mouth daily as needed (gout). Patient not taking: Reported on 01/13/2018 10/22/17   [name], MD  oxymetazoline (AFRIN NASAL SPRAY) 0.05 % nasal spray Place 1 spray into both nostrils 2 (two) times daily as needed for congestion. Patient not taking: Reported on 01/13/2018 03/12/17   Oswald Hillock, MD  predniSONE (DELTASONE) 10 MG tablet Take  4 each am x 2 days,   2 each am x 2 days,  1 each am x 2 days and stop Patient not taking: Reported on 01/13/2018 12/17/17   Tanda Rockers, MD    Family History Family History  Problem Relation Age of Onset  . Hypertension Mother   . Diabetes Father   . CAD Father   . Stomach cancer Father   . Clotting disorder Father   . Heart disease Father        transplant  . Allergies Brother     Social History Social History   Tobacco Use  . Smoking status: Former Smoker    Packs/day: 0.25     Years: 1.00    Pack years: 0.25    Types: Cigarettes    Last attempt to quit: 07/23/1989    Years since quitting: 28.4  . Smokeless tobacco: Never Used  Substance Use Topics  . Alcohol use: No    Alcohol/week: 0.0 standard drinks  . Drug use: No     Allergies   Patient has no known allergies.   Review of Systems Review of Systems  Respiratory: Positive for cough, chest tightness and shortness of breath.   Cardiovascular: Positive for leg swelling.  All other systems reviewed and are negative.    Physical Exam Updated Vital Signs BP (!) 160/69   Pulse 65   Temp 98.4 F (36.9 C)   Resp (!)  24   SpO2 98%   Physical Exam  Constitutional: He is oriented to person, place, and time. He appears well-developed and well-nourished. No distress.  Obese male in no acute distress  HENT:  Head: Normocephalic and atraumatic.  Eyes: Pupils are equal, round, and reactive to light. Conjunctivae and EOM are normal.  Neck: Normal range of motion. Neck supple.  Cardiovascular: Normal rate, regular rhythm and intact distal pulses.  Pulmonary/Chest: Effort normal and breath sounds normal. No respiratory distress. He has no wheezes.  Speaking in full sentences.  Clear lung sounds in all fields.  Abdominal: Soft. He exhibits no distension and no mass. There is no tenderness. There is no rebound and no guarding.  Musculoskeletal: Normal range of motion. He exhibits edema. He exhibits no tenderness.  1+ pitting edema bilaterally.  No redness, warmth, or inequality of lower legs.  Neurological: He is alert and oriented to person, place, and time. No sensory deficit.  Skin: Skin is warm and dry. Capillary refill takes less than 2 seconds.  Psychiatric: He has a normal mood and affect.  Nursing note and vitals reviewed.    ED Treatments / Results  Labs (all labs ordered are listed, but only abnormal results are displayed) Labs Reviewed  BASIC METABOLIC PANEL - Abnormal; Notable for the  following components:      Result Value   BUN 32 (*)    Creatinine, Ser 2.21 (*)    GFR calc non Af Amer 32 (*)    GFR calc Af Amer 37 (*)    All other components within normal limits  CBC - Abnormal; Notable for the following components:   Hemoglobin 12.8 (*)    All other components within normal limits  BRAIN NATRIURETIC PEPTIDE - Abnormal; Notable for the following components:   B Natriuretic Peptide 420.0 (*)    All other components within normal limits  I-STAT TROPONIN, ED  POCT I-STAT TROPONIN I    EKG EKG Interpretation  Date/Time:  Wednesday January 13 2018 16:18:06 EDT Ventricular Rate:  65 PR Interval:    QRS Duration: 101 QT Interval:  448 QTC Calculation: 466 R Axis:   -17 Text Interpretation:  Age not entered, assumed to be  57 years old for purpose of ECG interpretation Sinus rhythm Left ventricular hypertrophy Abnormal T, consider ischemia, lateral leads no sig change from previous Confirmed by Charlesetta Shanks [phone]) on 01/13/2018 9:31:02 PM   Radiology Dg Chest 2 View  Result Date: 01/13/2018 CLINICAL DATA:  Chest tightness and leg swelling. History of congestive heart failure. EXAM: CHEST - 2 VIEW COMPARISON:  01/08/2017 FINDINGS: There is borderline cardiomegaly. Pulmonary vascularity is normal. Lungs are clear. Chronic slight elevation of the lateral aspect of the right hemidiaphragm. Bones are normal. IMPRESSION: No acute abnormality.  Borderline chronic cardiomegaly. Electronically Signed   By: Lorriane Shire M.D.   On: 01/13/2018 17:22    Procedures Procedures (including critical care time)  Medications Ordered in ED Medications  furosemide (LASIX) injection 60 mg (60 mg Intravenous Given 01/13/18 2305)     Initial Impression / Assessment and Plan / ED Course  I have reviewed the triage vital signs and the nursing notes.  Pertinent labs & imaging results that were available during my care of the patient were reviewed by me and considered in my  medical decision making (see chart for details).     Patient presenting for evaluation of leg swelling, shortness of breath, chest tightness.  Patient reports symptoms are consistent with  previous episodes of CHF exacerbation.  Physical exam consistent with CHF exacerbation, with bilateral 1+ pitting edema.  No rales or rhonchi on exam.  Chest x-ray viewed interpreted by me, no effusions, shows mild cardiomegaly.  Patient is not tachycardiac or having any respiratory distress.  Sats remained stable.  Will obtain labs including BNP for further evaluation.  Labs reassuring, creatinine stable at 2.2.  BNP mildly elevated around 440, improved from previous.  Troponin negative, EKG without STEMI.  Doubt ACS.  Likely CHF etiology.  Will give dose of IV Lasix and have patient increase his torsemide over the next day, and follow-up with cardiology Monday of next week. Case discussed with attending, Dr. Johnney Killian agrees to plan. At this time, pt appears safe for d/c. Return precautions given. Pt states she understands and agrees to plan.    Final Clinical Impressions(s) / ED Diagnoses   Final diagnoses:  Acute on chronic congestive heart failure, unspecified heart failure type [facility])    ED Discharge Orders    None       Franchot Heidelberg, PA-C 01/13/18 2307    Charlesetta Shanks, MD 01/14/18 [phone]

## 2018-01-13 NOTE — Discharge Instructions (Signed)
Take 4 torsemide (total of 80 mg) in the morning and at bedtime tomorrow. Then return to your normal dose.  Be careful with your diet- restrict fluid and salt.  Follow up with your cardiologist/the heart failure clinic on Monday.  Return to the ER if you develop shortness of breath, chest pain, or any new or concerning symptoms.

## 2018-01-14 ENCOUNTER — Telehealth: Payer: Self-pay

## 2018-01-14 NOTE — Telephone Encounter (Signed)
Pt said he is feeling better. I made him an ER F/U on 01-18-18.

## 2018-01-18 ENCOUNTER — Ambulatory Visit: Payer: Managed Care, Other (non HMO) | Admitting: Internal Medicine

## 2018-01-19 ENCOUNTER — Encounter (HOSPITAL_COMMUNITY): Payer: Self-pay

## 2018-01-19 ENCOUNTER — Ambulatory Visit (HOSPITAL_COMMUNITY)
Admission: RE | Admit: 2018-01-19 | Discharge: 2018-01-19 | Disposition: A | Discharge status: Home / Self Care | Payer: Managed Care, Other (non HMO) | Source: Ambulatory Visit | Attending: Cardiology | Admitting: Cardiology

## 2018-01-19 VITALS — BP 174/96 | HR 80 | Wt 383.2 lb

## 2018-01-19 DIAGNOSIS — I4892 Unspecified atrial flutter: Secondary | ICD-10-CM | POA: Insufficient documentation

## 2018-01-19 DIAGNOSIS — I10 Essential (primary) hypertension: Secondary | ICD-10-CM

## 2018-01-19 DIAGNOSIS — Z833 Family history of diabetes mellitus: Secondary | ICD-10-CM | POA: Diagnosis not present

## 2018-01-19 DIAGNOSIS — Z6841 Body Mass Index (BMI) 40.0 and over, adult: Secondary | ICD-10-CM | POA: Diagnosis not present

## 2018-01-19 DIAGNOSIS — I480 Paroxysmal atrial fibrillation: Secondary | ICD-10-CM | POA: Insufficient documentation

## 2018-01-19 DIAGNOSIS — Z8249 Family history of ischemic heart disease and other diseases of the circulatory system: Secondary | ICD-10-CM | POA: Diagnosis not present

## 2018-01-19 DIAGNOSIS — J449 Chronic obstructive pulmonary disease, unspecified: Secondary | ICD-10-CM | POA: Insufficient documentation

## 2018-01-19 DIAGNOSIS — Z87891 Personal history of nicotine dependence: Secondary | ICD-10-CM | POA: Diagnosis not present

## 2018-01-19 DIAGNOSIS — G4733 Obstructive sleep apnea (adult) (pediatric): Secondary | ICD-10-CM | POA: Diagnosis not present

## 2018-01-19 DIAGNOSIS — N184 Chronic kidney disease, stage 4 (severe): Secondary | ICD-10-CM | POA: Insufficient documentation

## 2018-01-19 DIAGNOSIS — I5033 Acute on chronic diastolic (congestive) heart failure: Secondary | ICD-10-CM | POA: Diagnosis present

## 2018-01-19 DIAGNOSIS — Z79899 Other long term (current) drug therapy: Secondary | ICD-10-CM | POA: Diagnosis not present

## 2018-01-19 DIAGNOSIS — M109 Gout, unspecified: Secondary | ICD-10-CM | POA: Insufficient documentation

## 2018-01-19 DIAGNOSIS — N183 Chronic kidney disease, stage 3 unspecified: Secondary | ICD-10-CM

## 2018-01-19 DIAGNOSIS — I130 Hypertensive heart and chronic kidney disease with heart failure and stage 1 through stage 4 chronic kidney disease, or unspecified chronic kidney disease: Secondary | ICD-10-CM | POA: Insufficient documentation

## 2018-01-19 DIAGNOSIS — I5032 Chronic diastolic (congestive) heart failure: Secondary | ICD-10-CM | POA: Diagnosis not present

## 2018-01-19 DIAGNOSIS — I483 Typical atrial flutter: Secondary | ICD-10-CM

## 2018-01-19 DIAGNOSIS — Z7901 Long term (current) use of anticoagulants: Secondary | ICD-10-CM | POA: Diagnosis not present

## 2018-01-19 DIAGNOSIS — Z9989 Dependence on other enabling machines and devices: Secondary | ICD-10-CM

## 2018-01-19 MED ORDER — HYDRALAZINE HCL 50 MG PO TABS: 75.0000 mg | ORAL_TABLET | Freq: Three times a day (TID) | ORAL | 1 refills | Status: DC

## 2018-01-19 NOTE — Progress Notes (Signed)
Advanced Heart Failure Clinic Note   Referring Physician: [name] Primary Care: Silvio Pate Primary Cardiologist: [name]  EP: Dr Rayann Heman HF MD: Dr Haroldine Laws   HPI: Phillip [name] is a 57 y.o. male hx of PAF, remote PE, hx of DVT, OSA, morbid obesity, CKD Stage III,  HTN, and  combined chronic systolic/diastolic. S/P A Flutter ablation May 2019 .    Echo 12/21/16 with EF 55-60% G2DD, mild aortic regurgitation. Last nuc 2008 no ischemia, no hx of cath.   He has had multiple admissions to the hospital for volume overload/acute on chronic diastolic heart failure. Last admit in 12/18 d/c weight was 368. Lowest weight in last few month recorded 353.   He presents today for regular follow up. Seen in ED over weekend for SOB and swelling. Given IV lasix and extra torsemide for several days. Feeling better. Denies SOB/PND/Orthopnea. He is doing treadmill 15-20 minutes most days, lifting dumbbells as well. Denies lightheadedness or dizziness. Reports CPAP compliance. Still working as dump truck drive in [employer] Appetite is great. Has only been taking hydralazine 50 mg TID. (Prior to last visit was only taking BID, and thought this was the increase).   Review of systems complete and found to be negative unless listed in HPI.    Past Medical History:  Diagnosis Date  . CHF (congestive heart failure) ([place]) 11/15   EF 40-45%, improved with follow-up  . COPD (chronic obstructive pulmonary disease) ([place])   . Gout   . History of DVT (deep vein thrombosis)   . Hypertension   . Hypertensive cardiovascular disease   . Kidney dysfunction   . Morbid obesity ([place])   . OSA on CPAP   . Paroxysmal atrial fibrillation (HCC)   . PNA (pneumonia)   . Pulmonary embolism ([place]) 2010   left leg DVT   Current Outpatient Medications  Medication Sig Dispense Refill  . albuterol (PROVENTIL HFA;VENTOLIN HFA) 108 (90 Base) MCG/ACT inhaler Inhale 2 puffs into the lungs every 4 (four) hours as needed for wheezing or  shortness of breath. 1 Inhaler 0  . albuterol (PROVENTIL) (2.5 MG/3ML) 0.083% nebulizer solution Take 3 mLs (2.5 mg total) by nebulization every 6 (six) hours as needed for wheezing or shortness of breath. 360 mL 11  . allopurinol (ZYLOPRIM) 300 MG tablet TAKE 1 TABLET (300 MG TOTAL) DAILY BY MOUTH. 30 tablet 11  . budesonide-formoterol (SYMBICORT) 80-4.5 MCG/ACT inhaler Inhale 2 puffs 2 (two) times daily into the lungs. 3 Inhaler 3  . carvedilol (COREG) 6.25 MG tablet Take 1 tablet (6.25 mg total) by mouth 2 (two) times daily with a meal. 60 tablet 11  . cloNIDine (CATAPRES) 0.1 MG tablet TAKE 1 TABLET BY MOUTH THREE TIMES A DAY 90 tablet 3  . hydrALAZINE (APRESOLINE) 50 MG tablet Take 50 mg by mouth 3 (three) times daily.    [name] KLOR-CON M20 20 MEQ tablet TAKE 2 TABLETS (40 MEQ TOTAL) BY MOUTH DAILY. (Patient taking differently: Take 40 mEq by mouth once. ) 60 tablet 5  . montelukast (SINGULAIR) 10 MG tablet TAKE 1 TABLET BY MOUTH EVERYDAY AT BEDTIME (Patient taking differently: Take 10 mg by mouth at bedtime. ) 30 tablet 5  . naproxen sodium (ALEVE) 220 MG tablet Take 440 mg by mouth daily as needed (pain).    [name] oxymetazoline (AFRIN NASAL SPRAY) 0.05 % nasal spray Place 1 spray into both nostrils 2 (two) times daily as needed for congestion.    . rivaroxaban (XARELTO) 20 MG TABS tablet Take  1 tablet (20 mg total) by mouth daily with supper. 90 tablet 2  . torsemide (DEMADEX) 20 MG tablet Take 3 tablets (60 mg total) by mouth 2 (two) times daily. 540 tablet 3  . UNABLE TO FIND Med Name: CPAP with sleep    . colchicine 0.6 MG tablet Take 1 tablet (0.6 mg total) by mouth daily as needed (gout). (Patient not taking: Reported on 01/13/2018) 30 tablet 1   No current facility-administered medications for this encounter.    No Known Allergies  Social History   Socioeconomic History  . Marital status: Divorced    Spouse name: Not on file  . Number of children: 2  . Years of education: Not on file    . Highest education level: Not on file  Occupational History  . 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 Occupation: Airline pilot    Comment: Disabled  Social Needs  . Financial resource strain: Not on file  . Food insecurity:    Worry: Not on file    Inability: Not on file  . Transportation needs:    Medical: Not on file    Non-medical: Not on file  Tobacco Use  . Smoking status: Former Smoker    Packs/day: 0.25    Years: 1.00    Pack years: 0.25    Types: Cigarettes    Last attempt to quit: 07/23/1989    Years since quitting: 28.5  . Smokeless tobacco: Never Used  Substance and Sexual Activity  . Alcohol use: No    Alcohol/week: 0.0 standard drinks  . Drug use: No  . Sexual activity: Not Currently  Lifestyle  . Physical activity:    Days per week: Not on file    Minutes per session: Not on file  . Stress: Not on file  Relationships  . Social connections:    Talks on phone: Not on file    Gets together: Not on file    Attends religious service: Not on file    Active member of club or organization: Not on file    Attends meetings of clubs or organizations: Not on file    Relationship status: Not on file  . Intimate partner violence:    Fear of current or ex partner: Not on file    Emotionally abused: Not on file    Physically abused: Not on file    Forced sexual activity: Not on file  Other Topics Concern  . Not on file  Social History Narrative   Pt lives in [place], alone.   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   Has 2 sons in [place]      No living will   Would want sons to make decisions for him   Would accept resuscitation    Family History  Problem Relation Age of Onset  . Hypertension Mother   . Diabetes Father   . CAD Father   . Stomach cancer Father   . Clotting disorder Father   . Heart disease Father        transplant  . Allergies Brother    Vitals:   01/19/18 1033  BP: (!) 174/96  Pulse: 80  SpO2: 98%   Weight: (!) 173.8 kg (383 lb 3.2 oz)   Wt Readings from Last 3 Encounters:  01/19/18 (!) 173.8 kg (383 lb 3.2 oz)  12/17/17 (!) 171.9 kg (379 lb)  10/19/17 (!) 171.2 kg (377 lb 6.4 oz)   PHYSICAL EXAM: General: Well  appearing this am. NAD.  HEENT: Normal. Neck: Supple, JVP difficult due to body habitus. Carotids OK.  Cardiac:  Mechanical heart sounds with LVAD hum present.  Lungs:  CTAB, normal effort.  Abdomen: Markedly obese, NT, ND, no HSM. No bruits or masses. +BS  LVAD exit site: Well-healed and incorporated. Dressing dry and intact. No erythema or drainage. Stabilization device present and accurately applied. Driveline dressing changed daily per sterile technique. Extremities:  Warm and dry. No cyanosis, clubbing, rash, or edema.  Neuro:  Alert & oriented x 3. Cranial nerves grossly intact. Moves all 4 extremities w/o difficulty. Affect pleasant     ASSESSMENT & PLAN: 1. Chronic diastolic HF - Echo 10/9209 LVEF 55-60%, Grade 2 DD.  - NYHA II symptoms by report. - Volume status difficult, but appears to have normalized with IV lasix and extra torsemide.   Continue torsemide 60 mg BID. Can take 80 BID as needed.  - Continue spiro 25 mg daily. BMET last week stable.   2. PAF / Typical A-flutter, paroxysmal - S/P AFL ablation 07/2017. Regular on exam and NSR EKG 01/14/18 - Continue Xarelto 20 mg daily. Denies bleeding.   3. Hypertension - Elevated this am.  - Stable at OV 07/01/17.  - Increase hydralazine to 75 mg TID. Pt verbalizes undertanding. Plans to take BP at home and call if remains > 941 systolic after medicine change.  - Continue coreg 6.25 mg BID - Continue spiro 25 mg daily.   6. Morbid obesity - Body mass index is 56.59 kg/m.  - Encouraged exercise and low carb diet.   5. CKD IV - BMET stable in ED 10/24  - Avoid NSAIDS.   6. OSA on CPAP - Encouraged nightly CPAP use.   Meds as above. RTC 3 weeks with pharmacist for BP check and med titration.    Shirley Friar, PA-C  10:43 AM   Greater than 50% of the 25 minute visit was spent in counseling/coordination of care regarding disease state education, salt/fluid restriction, sliding scale diuretics, and medication compliance.

## 2018-01-19 NOTE — Addendum Note (Signed)
Encounter addended by: Jorge Ny, LCSW on: 01/19/2018 3:51 PM  Actions taken: Visit Navigator Flowsheet section accepted

## 2018-01-19 NOTE — Patient Instructions (Addendum)
Today you have been seen at the Heart failure clinic at [facility]   Medication changes: hydralazine 75 mg three times a day   Follow up with Phillip [name] 02/09/18 at 2:00pm  Follow up with NP/PA 04/21/2018 at 11:00 am  Do the following things EVERYDAY: 1) Weigh yourself in the morning before breakfast. Write it down and keep it in a log. 2) Take your medicines as prescribed 3) Eat low salt foods-Limit salt (sodium) to 2000 mg per day.  4) Stay as active as you can everyday 5) Limit all fluids for the day to less than 2 liters

## 2018-01-20 ENCOUNTER — Encounter

## 2018-01-20 ENCOUNTER — Ambulatory Visit: Payer: Managed Care, Other (non HMO) | Admitting: Podiatry

## 2018-01-20 DIAGNOSIS — L600 Ingrowing nail: Secondary | ICD-10-CM

## 2018-01-20 DIAGNOSIS — L608 Other nail disorders: Secondary | ICD-10-CM | POA: Diagnosis not present

## 2018-01-20 NOTE — Progress Notes (Signed)
   Subjective: Patient presents today for evaluation of pain to the lateral border of the left hallux that began a few weeks ago. She also reports pain in the right great toenail and states the nail is "digging into the skin". Patient is concerned for possible ingrown nail. She has not done anything for treatment. Wearing certain shoes and applying pressure to the areas increases the pain. Patient presents today for further treatment and evaluation.  Past Medical History:  Diagnosis Date  . CHF (congestive heart failure) ([place]) 11/15   EF 40-45%, improved with follow-up  . COPD (chronic obstructive pulmonary disease) ([place])   . Gout   . History of DVT (deep vein thrombosis)   . Hypertension   . Hypertensive cardiovascular disease   . Kidney dysfunction   . Morbid obesity ([place])   . OSA on CPAP   . Paroxysmal atrial fibrillation (H[facility])   . PNA (pneumonia)   . Pulmonary embolism ([place]) 2010   left leg DVT    Objective:  General: Well developed, nourished, in no acute distress, alert and oriented x3   Dermatology: Skin is warm, dry and supple bilateral. Lateral border of the left hallux appears to be erythematous with evidence of an ingrowing nail. Pain on palpation noted to the border of the nail fold. The remaining nails appear unremarkable at this time. There are no open sores, lesions.  Pincer nail deformity noted to the right hallux  Vascular: Dorsalis Pedis artery and Posterior Tibial artery pedal pulses palpable. No lower extremity edema noted.   Neruologic: Grossly intact via light touch bilateral.  Musculoskeletal: Muscular strength within normal limits in all groups bilateral. Normal range of motion noted to all pedal and ankle joints.   Assesement: #1 Paronychia with ingrowing nail lateral border left hallux  #2 Pincer nail right hallux  #3 Incurvated nail  Plan of Care:  1. Patient evaluated.  2. Discussed treatment alternatives and plan of care. Explained nail avulsion  procedure and post procedure course to patient. 3. Patient opted for conservative treatment at this time and does not want an invasive procedure done today.  4. Patient will consult cardiology to discuss stopping Xarelto prior to toenail procedures.  5. Mechanical debridement of the bilateral great toenails performed using a nail nipper. Filed with dremel without incident.  6. Return to clinic as needed.    Edrick Kins, DPM [facility]  Dr. Edrick Kins, [address]                                        [place], [place] [address]                Office [phone]  Fax [phone]

## 2018-01-26 ENCOUNTER — Other Ambulatory Visit (HOSPITAL_COMMUNITY): Payer: Self-pay

## 2018-01-26 MED ORDER — HYDRALAZINE HCL 50 MG PO TABS: 75.0000 mg | ORAL_TABLET | Freq: Three times a day (TID) | ORAL | 1 refills | Status: DC

## 2018-01-27 ENCOUNTER — Other Ambulatory Visit (HOSPITAL_COMMUNITY): Payer: Self-pay

## 2018-01-27 DIAGNOSIS — E876 Hypokalemia: Secondary | ICD-10-CM

## 2018-01-27 MED ORDER — POTASSIUM CHLORIDE CRYS ER 20 MEQ PO TBCR: EXTENDED_RELEASE_TABLET | ORAL | 5 refills | Status: DC

## 2018-01-27 MED ORDER — CARVEDILOL 6.25 MG PO TABS: 6.2500 mg | ORAL_TABLET | Freq: Two times a day (BID) | ORAL | 1 refills | Status: DC

## 2018-01-27 MED ORDER — TORSEMIDE 20 MG PO TABS: 60.0000 mg | ORAL_TABLET | Freq: Two times a day (BID) | ORAL | 3 refills | Status: DC

## 2018-02-04 ENCOUNTER — Other Ambulatory Visit (HOSPITAL_COMMUNITY): Payer: Self-pay

## 2018-02-04 NOTE — Telephone Encounter (Signed)
Opened in error

## 2018-02-05 ENCOUNTER — Encounter: Payer: Self-pay | Admitting: Internal Medicine

## 2018-02-05 ENCOUNTER — Ambulatory Visit (INDEPENDENT_AMBULATORY_CARE_PROVIDER_SITE_OTHER): Payer: Managed Care, Other (non HMO) | Admitting: Internal Medicine

## 2018-02-05 VITALS — BP 138/74 | HR 71 | Temp 98.1°F | Ht 67.0 in | Wt 379.0 lb

## 2018-02-05 DIAGNOSIS — Z1211 Encounter for screening for malignant neoplasm of colon: Secondary | ICD-10-CM

## 2018-02-05 DIAGNOSIS — N184 Chronic kidney disease, stage 4 (severe): Secondary | ICD-10-CM

## 2018-02-05 DIAGNOSIS — Z0000 Encounter for general adult medical examination without abnormal findings: Secondary | ICD-10-CM

## 2018-02-05 DIAGNOSIS — I82403 Acute embolism and thrombosis of unspecified deep veins of lower extremity, bilateral: Secondary | ICD-10-CM

## 2018-02-05 DIAGNOSIS — I5032 Chronic diastolic (congestive) heart failure: Secondary | ICD-10-CM | POA: Diagnosis not present

## 2018-02-05 DIAGNOSIS — I480 Paroxysmal atrial fibrillation: Secondary | ICD-10-CM

## 2018-02-05 DIAGNOSIS — Z125 Encounter for screening for malignant neoplasm of prostate: Secondary | ICD-10-CM

## 2018-02-05 DIAGNOSIS — J449 Chronic obstructive pulmonary disease, unspecified: Secondary | ICD-10-CM

## 2018-02-05 LAB — PSA: PSA: 0.49 ng/mL (ref 0.10–4.00)

## 2018-02-05 MED ORDER — ALBUTEROL SULFATE (2.5 MG/3ML) 0.083% IN NEBU: 2.5000 mg | INHALATION_SOLUTION | Freq: Four times a day (QID) | RESPIRATORY_TRACT | 11 refills | Status: DC | PRN

## 2018-02-05 NOTE — Assessment & Plan Note (Signed)
No symptoms to suggest recurrence. 

## 2018-02-05 NOTE — Assessment & Plan Note (Signed)
Discussed cancer screening--he would like to proceed Check PSA and FIT Prefers no flu vaccine--got sick with last one Discussed lifestyle/exercise

## 2018-02-05 NOTE — Assessment & Plan Note (Signed)
Recent visit with nephrologist-- stable GFR

## 2018-02-05 NOTE — Assessment & Plan Note (Signed)
Doing okay Sees Dr Melvyn Novas

## 2018-02-05 NOTE — Assessment & Plan Note (Signed)
Weighs daily and adjusting torsemide No exacerbation now

## 2018-02-05 NOTE — Assessment & Plan Note (Signed)
High risk for recurrence May be able to stop xarelto depending on atrial fib situation

## 2018-02-05 NOTE — Progress Notes (Signed)
Subjective:    Patient ID: Phillip [name], male    DOB: 03-08-1961, 57 y.o.   MRN: [id number]  HPI Here for physical  Reviewed ER note Diagnosed with mild CHF ---better after taking extra torsemide (80mg  twice) Has gone back to lifting---so hard to tell about his weight  Feels better since the ablation Continues on the xarelto ---had left leg DVT and also known atrial fib  Sen in ER in June for vertigo No recurrence MRI just showed some microvascular disease  Current Outpatient Medications on File Prior to Visit  Medication Sig Dispense Refill  . allopurinol (ZYLOPRIM) 300 MG tablet TAKE 1 TABLET (300 MG TOTAL) DAILY BY MOUTH. 30 tablet 11  . budesonide-formoterol (SYMBICORT) 80-4.5 MCG/ACT inhaler Inhale 2 puffs 2 (two) times daily into the lungs. 3 Inhaler 3  . carvedilol (COREG) 6.25 MG tablet Take 1 tablet (6.25 mg total) by mouth 2 (two) times daily with a meal. 180 tablet 1  . cloNIDine (CATAPRES) 0.1 MG tablet TAKE 1 TABLET BY MOUTH THREE TIMES A DAY 90 tablet 3  . colchicine 0.6 MG tablet Take 1 tablet (0.6 mg total) by mouth daily as needed (gout). 30 tablet 1  . hydrALAZINE (APRESOLINE) 50 MG tablet Take 1.5 tablets (75 mg total) by mouth 3 (three) times daily. 135 tablet 1  . montelukast (SINGULAIR) 10 MG tablet TAKE 1 TABLET BY MOUTH EVERYDAY AT BEDTIME (Patient taking differently: Take 10 mg by mouth at bedtime. ) 30 tablet 5  . naproxen sodium (ALEVE) 220 MG tablet Take 440 mg by mouth daily as needed (pain).    [name] oxymetazoline (AFRIN NASAL SPRAY) 0.05 % nasal spray Place 1 spray into both nostrils 2 (two) times daily as needed for congestion.    . potassium chloride SA (KLOR-CON M20) 20 MEQ tablet TAKE 2 TABLETS (40 MEQ TOTAL) BY MOUTH DAILY. 60 tablet 5  . rivaroxaban (XARELTO) 20 MG TABS tablet Take 1 tablet (20 mg total) by mouth daily with supper. 90 tablet 2  . torsemide (DEMADEX) 20 MG tablet Take 3 tablets (60 mg total) by mouth 2 (two) times daily. 540 tablet 3  .  UNABLE TO FIND Med Name: CPAP with sleep     No current facility-administered medications on file prior to visit.     No Known Allergies  Past Medical History:  Diagnosis Date  . CHF (congestive heart failure) ([facility]) 11/15   EF 40-45%, improved with follow-up  . COPD (chronic obstructive pulmonary disease) ([place])   . Gout   . History of DVT (deep vein thrombosis)   . Hypertension   . Hypertensive cardiovascular disease   . Kidney dysfunction   . Morbid obesity ([place])   . OSA on CPAP   . Paroxysmal atrial fibrillation ([facility])   . PNA (pneumonia)   . Pulmonary embolism ([place]) 2010   left leg DVT    Past Surgical History:  Procedure Laterality Date  . A-FLUTTER ABLATION N/A 07/22/2017   Procedure: A-FLUTTER ABLATION;  Surgeon: Thompson Grayer, MD;  Location: [facility];  Service: Cardiovascular;  Laterality: N/A;  . UMBILICAL HERNIA REPAIR  1992    Family History  Problem Relation Age of Onset  . Hypertension Mother   . Diabetes Father   . CAD Father   . Stomach cancer Father   . Clotting disorder Father   . Heart disease Father        transplant  . Allergies Brother     Social History  Socioeconomic History  . Marital status: Divorced    Spouse name: Not on file  . Number of children: 2  . Years of education: 5  . Highest education level: Not on file  Occupational History  . Occupation: Drives dump truck    Comment: Part time  . Occupation: Airline pilot    Comment: Disabled  Social Needs  . Financial resource strain: Not hard at all  . Food insecurity:    Worry: Never true    Inability: Never true  . Transportation needs:    Medical: No    Non-medical: No  Tobacco Use  . Smoking status: Former Smoker    Packs/day: 0.25    Years: 1.00    Pack years: 0.25    Types: Cigarettes    Last attempt to quit: 07/23/1989    Years since quitting: 28.5  . Smokeless tobacco: Never Used  Substance and Sexual Activity  . Alcohol use: No    Alcohol/week: 0.0  standard drinks  . Drug use: No  . Sexual activity: Not Currently  Lifestyle  . Physical activity:    Days per week: Not on file    Minutes per session: Not on file  . Stress: Not on file  Relationships  . Social connections:    Talks on phone: Not on file    Gets together: Not on file    Attends religious service: Not on file    Active member of club or organization: Not on file    Attends meetings of clubs or organizations: Not on file    Relationship status: Not on file  . Intimate partner violence:    Fear of current or ex partner: Not on file    Emotionally abused: Not on file    Physically abused: Not on file    Forced sexual activity: Not on file  Other Topics Concern  . Not on file  Social History Narrative   Pt lives in [place], alone.   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   Has 2 sons in [place]      No living will   Would want sons to make decisions for him   Would accept resuscitation   Review of Systems  Constitutional: Negative for fatigue.       Wears seat  belt  HENT: Negative for dental problem, hearing loss, tinnitus and trouble swallowing.        Overdue for dentist  Eyes: Negative for visual disturbance.       No diplopia or unilateral vision loss  Respiratory:       Stable DOE Some cough--but not much Rare wheezing  Cardiovascular: Positive for leg swelling. Negative for palpitations.       Still gets twinge he relates to the ablation  Gastrointestinal: Positive for constipation. Negative for blood in stool.       Now using miralax No heartburn  Endocrine: Negative for polydipsia and polyuria.  Genitourinary: Positive for frequency. Negative for difficulty urinating.       Some ED  Musculoskeletal: Positive for arthralgias and joint swelling. Negative for back pain.       Knee pain ---L>R  Allergic/Immunologic: Negative for environmental allergies and immunocompromised state.  Neurological: Negative for  dizziness, syncope and light-headedness.       Rare mild headache  Hematological: Negative for adenopathy. Bruises/bleeds easily.  Psychiatric/Behavioral: Negative for dysphoric mood and sleep disturbance. The patient is not nervous/anxious.  Objective:   Physical Exam  Constitutional: He is oriented to person, place, and time. No distress.  HENT:  Head: Normocephalic and atraumatic.  Right Ear: External ear normal.  Left Ear: External ear normal.  Mouth/Throat: Oropharynx is clear and moist. No oropharyngeal exudate.  Eyes: Pupils are equal, round, and reactive to light. Conjunctivae are normal.  Neck: No thyromegaly present.  Cardiovascular: Normal rate, regular rhythm, normal heart sounds and intact distal pulses. Exam reveals no gallop.  No murmur heard. Respiratory: Effort normal and breath sounds normal. No respiratory distress. He has no wheezes. He has no rales.  GI: Soft. There is no tenderness.  Musculoskeletal:  Thick legs but no pitting  Lymphadenopathy:    He has no cervical adenopathy.  Neurological: He is alert and oriented to person, place, and time.  Skin: No rash noted. No erythema.  Psychiatric: He has a normal mood and affect. His behavior is normal.           Assessment & Plan:

## 2018-02-09 ENCOUNTER — Ambulatory Visit (HOSPITAL_COMMUNITY)
Admission: RE | Admit: 2018-02-09 | Discharge: 2018-02-09 | Disposition: A | Discharge status: Home / Self Care | Payer: Managed Care, Other (non HMO) | Source: Ambulatory Visit | Attending: Cardiology | Admitting: Cardiology

## 2018-02-09 VITALS — BP 116/68 | HR 70 | Wt 382.8 lb

## 2018-02-09 DIAGNOSIS — I480 Paroxysmal atrial fibrillation: Secondary | ICD-10-CM | POA: Diagnosis not present

## 2018-02-09 DIAGNOSIS — I5022 Chronic systolic (congestive) heart failure: Secondary | ICD-10-CM

## 2018-02-09 DIAGNOSIS — G4733 Obstructive sleep apnea (adult) (pediatric): Secondary | ICD-10-CM | POA: Diagnosis not present

## 2018-02-09 DIAGNOSIS — N184 Chronic kidney disease, stage 4 (severe): Secondary | ICD-10-CM | POA: Diagnosis not present

## 2018-02-09 DIAGNOSIS — Z6841 Body Mass Index (BMI) 40.0 and over, adult: Secondary | ICD-10-CM | POA: Insufficient documentation

## 2018-02-09 DIAGNOSIS — I5032 Chronic diastolic (congestive) heart failure: Secondary | ICD-10-CM | POA: Diagnosis present

## 2018-02-09 DIAGNOSIS — I484 Atypical atrial flutter: Secondary | ICD-10-CM | POA: Diagnosis not present

## 2018-02-09 DIAGNOSIS — Z86718 Personal history of other venous thrombosis and embolism: Secondary | ICD-10-CM | POA: Diagnosis not present

## 2018-02-09 DIAGNOSIS — Z9889 Other specified postprocedural states: Secondary | ICD-10-CM | POA: Diagnosis not present

## 2018-02-09 DIAGNOSIS — I130 Hypertensive heart and chronic kidney disease with heart failure and stage 1 through stage 4 chronic kidney disease, or unspecified chronic kidney disease: Secondary | ICD-10-CM | POA: Diagnosis not present

## 2018-02-09 DIAGNOSIS — Z79899 Other long term (current) drug therapy: Secondary | ICD-10-CM | POA: Diagnosis not present

## 2018-02-09 NOTE — Progress Notes (Signed)
HF MD: BENSIMHON  HPI:  Phillip [name] a 57 y.o.malehx of PAF, remote PE, hx of DVT, OSA, morbid obesity, CKD Stage III, HTN, and combined chronic systolic/diastolic. S/P A Flutter ablation May 2019 .   Echo 12/21/16 with EF 55-60% G2DD, mild aortic regurgitation. Last nuc 2008 no ischemia, no hx of cath.  He has had multiple admissions to the hospital for volume overload/acute on chronic diastolic heart failure. Last admit in 12/18 d/c weight was 368. Lowest weight in last few month recorded 353.  He returns today for pharmacist-led HF medication titration. At last HF clinic visit on 10/29, his hydralazine was increase to 75 mg TID. Today, he reports feeling a lot better. He reports that his increased hydralazine decreased his home SBP from the 160s to the 130s. He reports measuring his forearm BP before taking his morning medications and before dinner. He endorses working out more and is now able to be on the treadmill for 18 minutes which is improved from 10 minutes. He also lifts weights. He currently works as a Materials engineer and continues to have some swelling in his legs when he gets out of his truck after sitting for a while. He says that the torsemide is able to get the swelling down well, and he has not had to take an extra 20 mg of torsemide for 3 weeks. Reports CPAP compliance. He uses naproxen occasionally for knee pain.   Shortness of breath/dyspnea on exertion? Yes - only with strenuous exercise  Orthopnea/PND? Yes - 2 pillows  Edema? Yes - after sitting for long periods of time  Lightheadedness/dizziness? no  Daily weights at home? Yes   Blood pressure/heart rate monitoring at home? Yes - (136/78 yesterday AM)  Following low-sodium/fluid-restricted diet? yes  HF/HTN Medications: Carvedilol 6.25 mg BID Hydralazine 75 mg TID Torsemide 60 mg BID Clonidine 0.1 mg TID  Has the patient been experiencing any side effects to the medications prescribed?   no  Does the patient have any problems obtaining medications due to transportation or finances?   No - [employer] commercial  Understanding of regimen: good Understanding of indications: good Potential of compliance: good Patient understands to avoid NSAIDs. Patient understands to avoid decongestants.  Pertinent Lab Values:  01/13/18: Serum creatinine 2.21 (BL ~2.0-2.3), BUN 32, Potassium 3.7, Sodium 144  Vital Signs:  Weight: 382.8 lb (dry weight: 362 lb)  Blood pressure: 116/68 mmHg   Heart rate: 70 bpm   Assessment: 1. Chronicdiastolic CHF (EF 22-97%>>98-92% on 12/21/16), due to HTN/OSA. NYHA class IIsymptoms. - Volume status stable - Continue Torsemide 60 mg BID with additional 20 mg for increased weight gain/swelling - Recommended addition of Spironolactone 12.5 mg for added benefit of HF hospitalizations and BP control. Patient refused addition of Spironolactone as he feels "pretty good" and is concerned about his kidney function and missing work to attend follow-up appointments/labwork ("loses $500 per day"). He is willing to reconsider in the future if anything changes. - Basic disease state pathophysiology, medication indication, mechanism and side effects reviewed at length with patient and he verbalized understanding  2. PAF / Typical A-flutter, paroxysmal - S/P AFL ablation 07/2017.Regular on exam and NSR EKG 01/14/18 - Continue Xarelto 20 mg daily.Denies bleeding.  3. Hypertension -Within goal (<130/80) - Continue hydralazine 75 mg TID, carvedilol 6.25 mg BID, and clonidine 0.1 mg TID - Spironolactone off since 07/2017 due to elevated SCr. Refused re-initiation at this visit. See above.  6. Morbid obesity -Body mass index is 56.59 >>59.95  kg/m. - Continue to increase exercise and low carb diet.  5. CKD IV - BMET stable in ED 10/24 - Avoid NSAIDS.Patient will stop using naproxen and use Tylenol if needed instead  6. OSA on CPAP - Continue  nightly CPAP use.   Plan: 1) Medication changes: Based on clinical presentation, vital signs and recent labs will continue current medication regimen 2) Labs: PRN 3) Follow-up: PA/NP on 04/21/18   Doroteo Bradford K. Velva Harman, PharmD, BCPS, CPP Clinical Pharmacist Phone: [phone] 02/08/2018 7:44 PM

## 2018-02-09 NOTE — Patient Instructions (Addendum)
It was great to see you today!  Continue your medications as you have been taking them.  Keep your appointment with the NP/PA on 04/21/18  Please call the pharmacist if you have any questions or concerns with your medications and/or blood pressure readings.

## 2018-02-10 ENCOUNTER — Other Ambulatory Visit (INDEPENDENT_AMBULATORY_CARE_PROVIDER_SITE_OTHER): Payer: Managed Care, Other (non HMO)

## 2018-02-10 DIAGNOSIS — Z1211 Encounter for screening for malignant neoplasm of colon: Secondary | ICD-10-CM | POA: Diagnosis not present

## 2018-02-10 LAB — FECAL OCCULT BLOOD, IMMUNOCHEMICAL: Fecal Occult Bld: NEGATIVE

## 2018-03-06 ENCOUNTER — Emergency Department (HOSPITAL_COMMUNITY)
Admission: EM | Admit: 2018-03-06 | Discharge: 2018-03-06 | Disposition: A | Discharge status: Home / Self Care | Payer: Managed Care, Other (non HMO) | Attending: Emergency Medicine | Admitting: Emergency Medicine

## 2018-03-06 ENCOUNTER — Other Ambulatory Visit: Payer: Self-pay

## 2018-03-06 ENCOUNTER — Emergency Department (HOSPITAL_COMMUNITY): Payer: Managed Care, Other (non HMO)

## 2018-03-06 ENCOUNTER — Encounter (HOSPITAL_COMMUNITY): Payer: Self-pay

## 2018-03-06 DIAGNOSIS — Z87891 Personal history of nicotine dependence: Secondary | ICD-10-CM | POA: Insufficient documentation

## 2018-03-06 DIAGNOSIS — I4892 Unspecified atrial flutter: Secondary | ICD-10-CM | POA: Insufficient documentation

## 2018-03-06 DIAGNOSIS — Z79899 Other long term (current) drug therapy: Secondary | ICD-10-CM | POA: Diagnosis not present

## 2018-03-06 DIAGNOSIS — I480 Paroxysmal atrial fibrillation: Secondary | ICD-10-CM | POA: Diagnosis not present

## 2018-03-06 DIAGNOSIS — R0609 Other forms of dyspnea: Secondary | ICD-10-CM | POA: Diagnosis not present

## 2018-03-06 DIAGNOSIS — I5032 Chronic diastolic (congestive) heart failure: Secondary | ICD-10-CM | POA: Insufficient documentation

## 2018-03-06 DIAGNOSIS — R0602 Shortness of breath: Secondary | ICD-10-CM | POA: Diagnosis present

## 2018-03-06 DIAGNOSIS — Z7901 Long term (current) use of anticoagulants: Secondary | ICD-10-CM | POA: Diagnosis not present

## 2018-03-06 DIAGNOSIS — J449 Chronic obstructive pulmonary disease, unspecified: Secondary | ICD-10-CM | POA: Diagnosis not present

## 2018-03-06 DIAGNOSIS — I110 Hypertensive heart disease with heart failure: Secondary | ICD-10-CM | POA: Insufficient documentation

## 2018-03-06 DIAGNOSIS — R0600 Dyspnea, unspecified: Secondary | ICD-10-CM

## 2018-03-06 DIAGNOSIS — J4530 Mild persistent asthma, uncomplicated: Secondary | ICD-10-CM | POA: Diagnosis not present

## 2018-03-06 LAB — BASIC METABOLIC PANEL
Anion gap: 10 (ref 5–15)
BUN: 28 mg/dL — ABNORMAL HIGH (ref 6–20)
CO2: 29 mmol/L (ref 22–32)
Calcium: 9 mg/dL (ref 8.9–10.3)
Chloride: 105 mmol/L (ref 98–111)
Creatinine, Ser: 1.99 mg/dL — ABNORMAL HIGH (ref 0.61–1.24)
GFR calc Af Amer: 43 mL/min — ABNORMAL LOW (ref >60)
GFR calc non Af Amer: 37 mL/min — ABNORMAL LOW (ref >60)
Glucose, Bld: 99 mg/dL (ref 70–99)
Potassium: 4 mmol/L (ref 3.5–5.1)
Sodium: 144 mmol/L (ref 135–145)

## 2018-03-06 LAB — CBC WITH DIFFERENTIAL/PLATELET
Abs Immature Granulocytes: 0.01 10*3/uL (ref 0.00–0.07)
Basophils Absolute: 0 10*3/uL (ref 0.0–0.1)
Basophils Relative: 1 %
Eosinophils Absolute: 0.1 10*3/uL (ref 0.0–0.5)
Eosinophils Relative: 2 %
HCT: 39.5 % (ref 39.0–52.0)
Hemoglobin: 11.9 g/dL — ABNORMAL LOW (ref 13.0–17.0)
Immature Granulocytes: 0 %
Lymphocytes Relative: 27 %
Lymphs Abs: 1.3 10*3/uL (ref 0.7–4.0)
MCH: 27.4 pg (ref 26.0–34.0)
MCHC: 30.1 g/dL (ref 30.0–36.0)
MCV: 90.8 fL (ref 80.0–100.0)
Monocytes Absolute: 0.5 10*3/uL (ref 0.1–1.0)
Monocytes Relative: 10 %
Neutro Abs: 2.9 10*3/uL (ref 1.7–7.7)
Neutrophils Relative %: 60 %
Platelets: 165 10*3/uL (ref 150–400)
RBC: 4.35 MIL/uL (ref 4.22–5.81)
RDW: 14.6 % (ref 11.5–15.5)
WBC: 4.7 10*3/uL (ref 4.0–10.5)
nRBC: 0 % (ref 0.0–0.2)

## 2018-03-06 LAB — BRAIN NATRIURETIC PEPTIDE: B Natriuretic Peptide: 518.6 pg/mL — ABNORMAL HIGH (ref 0.0–100.0)

## 2018-03-06 LAB — I-STAT TROPONIN, ED: Troponin i, poc: 0.04 ng/mL (ref 0.00–0.08)

## 2018-03-06 MED ORDER — FUROSEMIDE 10 MG/ML IJ SOLN
80.0000 mg | Freq: Once | INTRAMUSCULAR | Status: AC
  Administered 2018-03-06: 80 mg via INTRAVENOUS
  Filled 2018-03-06: qty 8

## 2018-03-06 MED ORDER — ALBUTEROL SULFATE (2.5 MG/3ML) 0.083% IN NEBU
5.0000 mg | INHALATION_SOLUTION | Freq: Once | RESPIRATORY_TRACT | Status: DC
  Filled 2018-03-06: qty 6

## 2018-03-06 NOTE — ED Provider Notes (Signed)
[facility] DEPT Provider Note   CSN: [id number] Arrival date & time: 03/06/18  1844     History   Chief Complaint Chief Complaint  Patient presents with  . Shortness of Breath    HPI Phillip [name] is a 57 y.o. male with history of CHF, COPD, DVT and PE, OSA, morbid obesity, hypertension, paroxysmal A. fib presents for evaluation of acute onset, progressively worsening shortness of breath for 5 days.  He reports 6 pound weight gain during this time and bilateral lower extremity edema around the ankles.  He notes dyspnea on exertion which improves with rest as well as mild generalized chest pressure which worsens laying flat.  Denies chest pain. No aggravating or alleviating features noted otherwise.  He has increased his torsemide to 80 mg twice daily yesterday and today without significant relief.  He does report this feels like a CHF exacerbation.  He denies fever, cough, abdominal pain, nausea, or vomiting.  The history is provided by the patient.    Past Medical History:  Diagnosis Date  . CHF (congestive heart failure) ([facility]) 11/15   EF 40-45%, improved with follow-up  . COPD (chronic obstructive pulmonary disease) ([facility])   . Gout   . History of DVT (deep vein thrombosis)   . Hypertension   . Hypertensive cardiovascular disease   . Kidney dysfunction   . Morbid obesity ([place] [facility])   . OSA on CPAP   . Paroxysmal atrial fibrillation ([facility])   . PNA (pneumonia)   . Pulmonary embolism ([facility]) 2010   left leg DVT    Patient Active Problem List   Diagnosis Date Noted  . Preventative health care 02/05/2018  . Atrial flutter ([facility]) 06/22/2017  . Anemia 03/23/2017  . Acute rhinitis 03/10/2017  . CHF (congestive heart failure) ([facility]) 03/10/2017  . Asthma with COPD ([facility])   . Chronic diastolic heart failure ([facility])   . Long term (current) use of anticoagulants [Z79.01] 08/17/2015  . Hx pulmonary embolism 08/17/2015  . Deep vein thrombosis (DVT) of lower  extremity ([facility]) 08/17/2015  . Paroxysmal atrial fibrillation ([place]) 08/04/2015  . SVT (supraventricular tachycardia) ([facility]) 06/22/2015  . Dyspnea on exertion 06/20/2015  . Morbid obesity due to excess calories ([facility]) 03/07/2015  . Elevated troponin 02/20/2015  . Essential hypertension   . Gout   . Mild persistent asthma in adult without complication 38/12/1749  . OSA on CPAP 05/06/2014  . Chronic kidney disease, stage IV (severe) ([facility]) 05/06/2014  . Asthma with acute exacerbation 05/06/2014    Past Surgical History:  Procedure Laterality Date  . A-FLUTTER ABLATION N/A 07/22/2017   Procedure: A-FLUTTER ABLATION;  Surgeon: Thompson Grayer, MD;  Location: [facility];  Service: Cardiovascular;  Laterality: N/A;  . [place] Medications    Prior to Admission medications   Medication Sig Start Date End Date Taking? Authorizing Provider  albuterol (PROVENTIL) (2.5 MG/3ML) 0.083% nebulizer solution Take 3 mLs (2.5 mg total) by nebulization every 6 (six) hours as needed for wheezing or shortness of breath. 02/05/18   [name], MD  allopurinol (ZYLOPRIM) 300 MG tablet TAKE 1 TABLET (300 MG TOTAL) DAILY BY MOUTH. 01/07/18   [name], MD  budesonide-formoterol (SYMBICORT) 80-4.5 MCG/ACT inhaler Inhale 2 puffs 2 (two) times daily into the lungs. 02/06/17   Tanda Rockers, MD  carvedilol (COREG) 6.25 MG tablet Take 1 tablet (6.25 mg total) by mouth 2 (two) times daily  with a meal. 01/27/18   Larey Dresser, MD  cloNIDine (CATAPRES) 0.1 MG tablet TAKE 1 TABLET BY MOUTH THREE TIMES A DAY 12/08/17   [name] I, MD  colchicine 0.6 MG tablet Take 1 tablet (0.6 mg total) by mouth daily as needed (gout). 10/22/17   [name], MD  hydrALAZINE (APRESOLINE) 50 MG tablet Take 1.5 tablets (75 mg total) by mouth 3 (three) times daily. 01/26/18   Shirley Friar, PA-C  montelukast (SINGULAIR) 10 MG tablet TAKE 1 TABLET BY MOUTH EVERYDAY  AT BEDTIME Patient taking differently: Take 10 mg by mouth at bedtime.  12/07/17   Tanda Rockers, MD  naproxen sodium (ALEVE) 220 MG tablet Take 440 mg by mouth daily as needed (pain).    [provider]  oxymetazoline (AFRIN NASAL SPRAY) 0.05 % nasal spray Place 1 spray into both nostrils 2 (two) times daily as needed for congestion. 03/12/17   Oswald Hillock, MD  potassium chloride SA (KLOR-CON M20) 20 MEQ tablet TAKE 2 TABLETS (40 MEQ TOTAL) BY MOUTH DAILY. 01/27/18   Larey Dresser, MD  rivaroxaban (XARELTO) 20 MG TABS tablet Take 1 tablet (20 mg total) by mouth daily with supper. 07/28/17   Allred, Jeneen Rinks, MD  torsemide (DEMADEX) 20 MG tablet Take 3 tablets (60 mg total) by mouth 2 (two) times daily. 01/27/18 01/22/19  Larey Dresser, MD  UNABLE TO FIND Med Name: CPAP with sleep    [provider]    Family History Family History  Problem Relation Age of Onset  . Hypertension Mother   . Diabetes Father   . CAD Father   . Stomach cancer Father   . Clotting disorder Father   . Heart disease Father        transplant  . Allergies Brother     Social History Social History   Tobacco Use  . Smoking status: Former Smoker    Packs/day: 0.25    Years: 1.00    Pack years: 0.25    Types: Cigarettes    Last attempt to quit: 07/23/1989    Years since quitting: 28.6  . Smokeless tobacco: Never Used  Substance Use Topics  . Alcohol use: No    Alcohol/week: 0.0 standard drinks  . Drug use: No     Allergies   Other   Review of Systems Review of Systems  Constitutional: Negative for chills and fever.  Respiratory: Positive for shortness of breath. Negative for cough.   Cardiovascular: Positive for leg swelling.  Gastrointestinal: Negative for abdominal pain, nausea and vomiting.  All other systems reviewed and are negative.    Physical Exam Updated Vital Signs BP (!) 175/78   Pulse (!) 55   Temp (!) 97.5 F (36.4 C) (Oral)   Resp (!) 22   Ht 5\' 9"   (1.753 m)   Wt (!) 176 kg   SpO2 98%   BMI 57.30 kg/m   Physical Exam Vitals signs and nursing note reviewed.  Constitutional:      General: He is not in acute distress.    Appearance: He is well-developed. He is obese.     Comments: Morbidly obese male, resting comfortably in bed  HENT:     Head: Normocephalic and atraumatic.  Eyes:     General:        Right eye: No discharge.        Left eye: No discharge.     Conjunctiva/sclera: Conjunctivae normal.  Neck:  Vascular: No JVD.     Trachea: No tracheal deviation.  Cardiovascular:     Rate and Rhythm: Normal rate and regular rhythm.     Pulses: Normal pulses.     Comments: 2+ radial and DP/PT pulses bilaterally, Homans sign absent bilaterally, trace nonpitting edema of the bilateral lower extremities, no palpable cords, compartments are soft  Pulmonary:     Effort: Pulmonary effort is normal. Tachypnea present.     Breath sounds: Decreased breath sounds present.     Comments: Mildly tachypneic, globally diminished breath sounds Chest:     Chest wall: No tenderness.  Abdominal:     General: Bowel sounds are normal. There is no distension.     Palpations: Abdomen is soft.     Tenderness: There is no abdominal tenderness.  Musculoskeletal:     Right lower leg: He exhibits no tenderness. Edema present.     Left lower leg: He exhibits no tenderness. Edema present.  Skin:    General: Skin is warm and dry.     Findings: No erythema.  Neurological:     Mental Status: He is alert.  Psychiatric:        Behavior: Behavior normal.      ED Treatments / Results  Labs (all labs ordered are listed, but only abnormal results are displayed) Labs Reviewed  BASIC METABOLIC PANEL - Abnormal; Notable for the following components:      Result Value   BUN 28 (*)    Creatinine, Ser 1.99 (*)    GFR calc non Af Amer 37 (*)    GFR calc Af Amer 43 (*)    All other components within normal limits  BRAIN NATRIURETIC PEPTIDE -  Abnormal; Notable for the following components:   B Natriuretic Peptide 518.6 (*)    All other components within normal limits  CBC WITH DIFFERENTIAL/PLATELET - Abnormal; Notable for the following components:   Hemoglobin 11.9 (*)    All other components within normal limits  I-STAT TROPONIN, ED    EKG EKG Interpretation  Date/Time:  Saturday March 06 2018 19:00:18 EST Ventricular Rate:  68 PR Interval:    QRS Duration: 100 QT Interval:  451 QTC Calculation: 480 R Axis:   -8 Text Interpretation:  Sinus rhythm Abnormal T, consider ischemia, lateral leads Baseline wander in lead(s) V2 No significant change since last tracing Confirmed by Quintella Reichert [phone]) on 03/06/2018 8:15:44 PM   Radiology Dg Chest 2 View  Result Date: 03/06/2018 CLINICAL DATA:  Shortness of breath for 5 days, history CHF, atrial fibrillation, COPD, pulmonary embolism, pneumonia, former smoker EXAM: CHEST - 2 VIEW COMPARISON:  01/13/2018 FINDINGS: Enlargement of cardiac silhouette. Slight pulmonary vascular congestion. Mediastinal contour stable. Lungs clear. No infiltrate, pleural effusion or pneumothorax. IMPRESSION: Enlargement of cardiac silhouette and slight pulmonary vascular congestion without acute infiltrate. Electronically Signed   By: Lavonia Dana M.D.   On: 03/06/2018 19:22    Procedures Procedures (including critical care time)  Medications Ordered in ED Medications  furosemide (LASIX) injection 80 mg (80 mg Intravenous Given 03/06/18 2059)     Initial Impression / Assessment and Plan / ED Course  I have reviewed the triage vital signs and the nursing notes.  Pertinent labs & imaging results that were available during my care of the patient were reviewed by me and considered in my medical decision making (see chart for details).     Patient presenting for evaluation of dyspnea on exertion and weight gain for the last  few days.  He is afebrile, somewhat hypertensive in the ED,  intermittently mildly tachypneic.  SPO2 saturation stable.  He is overall well-appearing.  Lab work reviewed by me shows renal insufficiency at patient's baseline, elevated BNP around the patient's baseline, and mild anemia.  EKG shows no significant changes from last tracing, no ischemic changes.  I doubt ACS/MI and the patient denies chest pain. Presentation is inconsistent with PE. Doubt dissection, cardiac tamponade, pericardial effusion, esophageal rupture, or pneumonia. chest x-ray shows enlargement of the cardiac silhouette and slight pulmonary vascular congestion without acute infiltrate.  This does suggest CHF.  He was given a dose of IV Lasix with good urine output.  He was ambulated on pulse oximetry with stable SPO2 saturations.  He was reassessed by Dr. Ralene Bathe with patient reporting that he does feel better and feels comfortable with discharge home.  I think this is reasonable given his overall well appearance; we will plan to increase his torsemide and have the patient call his cardiologist Dr. Haroldine Laws on Monday in less than 48 hours for reevaluation.  We offered home health which the patient declined.  Discussed strict ED return precautions. Pt verbalized understanding of and agreement with plan and is safe for discharge home at this time.  Patient seen and evaluated Dr. Ralene Bathe who agrees with assessment and plan at this time. Final Clinical Impressions(s) / ED Diagnoses   Final diagnoses:  DOE (dyspnea on exertion)    ED Discharge Orders    None       Debroah Baller 03/07/18 Tami Lin, MD 03/10/18 1118

## 2018-03-06 NOTE — ED Notes (Signed)
Ambulated pt with pulse oximetry, he reported and demonstrated some degree of exersion, O2 SAT remained 99-100% will pulse ranging 54-60 bpm.

## 2018-03-06 NOTE — ED Triage Notes (Signed)
Patient c/o SOB x 5 days. Patient has a history of CHF.

## 2018-03-06 NOTE — Discharge Instructions (Addendum)
Increase your torsemide to 80 mg twice daily for the next 2 days.  Call your cardiologist for reevaluation.  Return to the emergency department if any concerning signs or symptoms develop such as worsening shortness of breath, persistent chest pains, low oxygen saturations, or passing out.

## 2018-03-10 ENCOUNTER — Telehealth: Payer: Self-pay

## 2018-03-10 NOTE — Telephone Encounter (Signed)
Spoke to pt. He said his SOB is much better now.

## 2018-04-02 ENCOUNTER — Other Ambulatory Visit: Payer: Self-pay | Admitting: Internal Medicine

## 2018-04-02 ENCOUNTER — Telehealth: Payer: Self-pay

## 2018-04-02 NOTE — Telephone Encounter (Signed)
Should be fine to take Mucinex 600 mg Q12H for 3 days

## 2018-04-02 NOTE — Telephone Encounter (Signed)
Pt request refill of xarelto 20mg  TABS. Pt is a 38yrm, creatintine 1.99 (03/06/18), and a weight of 171.9kg with a CrCl of 175ml and clears for 20mg  of Xarelto. Pt was last seen by Dr. Thompson Grayer on 08/19/17.

## 2018-04-02 NOTE — Telephone Encounter (Signed)
Spoke to pt

## 2018-04-02 NOTE — Telephone Encounter (Signed)
Pt left v/m that he feels like he is getting a head cold and wants to know if he can take mucinex with the other medications pt is taking; pt request cb whether or not he can take the mucinex.Please advise. Dr Silvio Pate is out of office and will send note to provider in office.

## 2018-04-03 ENCOUNTER — Encounter (HOSPITAL_COMMUNITY): Payer: Self-pay | Admitting: Emergency Medicine

## 2018-04-03 ENCOUNTER — Emergency Department (HOSPITAL_COMMUNITY): Payer: Managed Care, Other (non HMO)

## 2018-04-03 ENCOUNTER — Emergency Department (HOSPITAL_COMMUNITY)
Admission: EM | Admit: 2018-04-03 | Discharge: 2018-04-04 | Disposition: A | Discharge status: Home / Self Care | Payer: Managed Care, Other (non HMO) | Attending: Emergency Medicine | Admitting: Emergency Medicine

## 2018-04-03 ENCOUNTER — Other Ambulatory Visit: Payer: Self-pay

## 2018-04-03 DIAGNOSIS — R0600 Dyspnea, unspecified: Secondary | ICD-10-CM

## 2018-04-03 DIAGNOSIS — I110 Hypertensive heart disease with heart failure: Secondary | ICD-10-CM | POA: Insufficient documentation

## 2018-04-03 DIAGNOSIS — I5032 Chronic diastolic (congestive) heart failure: Secondary | ICD-10-CM | POA: Insufficient documentation

## 2018-04-03 DIAGNOSIS — J40 Bronchitis, not specified as acute or chronic: Secondary | ICD-10-CM | POA: Diagnosis not present

## 2018-04-03 DIAGNOSIS — D649 Anemia, unspecified: Secondary | ICD-10-CM | POA: Insufficient documentation

## 2018-04-03 DIAGNOSIS — N184 Chronic kidney disease, stage 4 (severe): Secondary | ICD-10-CM | POA: Insufficient documentation

## 2018-04-03 DIAGNOSIS — Z7901 Long term (current) use of anticoagulants: Secondary | ICD-10-CM | POA: Insufficient documentation

## 2018-04-03 DIAGNOSIS — Z79899 Other long term (current) drug therapy: Secondary | ICD-10-CM | POA: Diagnosis not present

## 2018-04-03 DIAGNOSIS — I130 Hypertensive heart and chronic kidney disease with heart failure and stage 1 through stage 4 chronic kidney disease, or unspecified chronic kidney disease: Secondary | ICD-10-CM | POA: Diagnosis not present

## 2018-04-03 DIAGNOSIS — J449 Chronic obstructive pulmonary disease, unspecified: Secondary | ICD-10-CM | POA: Insufficient documentation

## 2018-04-03 DIAGNOSIS — Z87891 Personal history of nicotine dependence: Secondary | ICD-10-CM | POA: Insufficient documentation

## 2018-04-03 LAB — BASIC METABOLIC PANEL
Anion gap: 8 (ref 5–15)
BUN: 25 mg/dL — ABNORMAL HIGH (ref 6–20)
CALCIUM: 9.1 mg/dL (ref 8.9–10.3)
CO2: 29 mmol/L (ref 22–32)
Chloride: 105 mmol/L (ref 98–111)
Creatinine, Ser: 2.08 mg/dL — ABNORMAL HIGH (ref 0.61–1.24)
GFR calc Af Amer: 40 mL/min — ABNORMAL LOW (ref >60)
GFR calc non Af Amer: 35 mL/min — ABNORMAL LOW (ref >60)
Glucose, Bld: 106 mg/dL — ABNORMAL HIGH (ref 70–99)
Potassium: 3.5 mmol/L (ref 3.5–5.1)
Sodium: 142 mmol/L (ref 135–145)

## 2018-04-03 LAB — CBC WITH DIFFERENTIAL/PLATELET
ABS IMMATURE GRANULOCYTES: 0.01 10*3/uL (ref 0.00–0.07)
BASOS PCT: 1 %
Basophils Absolute: 0 10*3/uL (ref 0.0–0.1)
Eosinophils Absolute: 0 10*3/uL (ref 0.0–0.5)
Eosinophils Relative: 1 %
HCT: 38.2 % — ABNORMAL LOW (ref 39.0–52.0)
Hemoglobin: 11.5 g/dL — ABNORMAL LOW (ref 13.0–17.0)
IMMATURE GRANULOCYTES: 0 %
Lymphocytes Relative: 24 %
Lymphs Abs: 1.1 10*3/uL (ref 0.7–4.0)
MCH: 27.8 pg (ref 26.0–34.0)
MCHC: 30.1 g/dL (ref 30.0–36.0)
MCV: 92.5 fL (ref 80.0–100.0)
Monocytes Absolute: 0.8 10*3/uL (ref 0.1–1.0)
Monocytes Relative: 18 %
NEUTROS ABS: 2.5 10*3/uL (ref 1.7–7.7)
NEUTROS PCT: 56 %
Platelets: 145 10*3/uL — ABNORMAL LOW (ref 150–400)
RBC: 4.13 MIL/uL — ABNORMAL LOW (ref 4.22–5.81)
RDW: 14.5 % (ref 11.5–15.5)
WBC: 4.4 10*3/uL (ref 4.0–10.5)
nRBC: 0 % (ref 0.0–0.2)

## 2018-04-03 LAB — BRAIN NATRIURETIC PEPTIDE: B Natriuretic Peptide: 615.1 pg/mL — ABNORMAL HIGH (ref 0.0–100.0)

## 2018-04-03 LAB — D-DIMER, QUANTITATIVE: D-Dimer, Quant: 0.46 ug/mL-FEU (ref 0.00–0.50)

## 2018-04-03 LAB — I-STAT TROPONIN, ED: Troponin i, poc: 0.06 ng/mL (ref 0.00–0.08)

## 2018-04-03 MED ORDER — ALBUTEROL SULFATE (2.5 MG/3ML) 0.083% IN NEBU
5.0000 mg | INHALATION_SOLUTION | Freq: Once | RESPIRATORY_TRACT | Status: AC
  Administered 2018-04-03: 5 mg via RESPIRATORY_TRACT
  Filled 2018-04-03: qty 6

## 2018-04-03 MED ORDER — IPRATROPIUM-ALBUTEROL 0.5-2.5 (3) MG/3ML IN SOLN
3.0000 mL | Freq: Once | RESPIRATORY_TRACT | Status: AC
  Administered 2018-04-03: 3 mL via RESPIRATORY_TRACT
  Filled 2018-04-03: qty 3

## 2018-04-03 MED ORDER — FUROSEMIDE 10 MG/ML IJ SOLN
80.0000 mg | Freq: Once | INTRAMUSCULAR | Status: AC
  Administered 2018-04-03: 80 mg via INTRAVENOUS
  Filled 2018-04-03: qty 8

## 2018-04-03 NOTE — ED Triage Notes (Signed)
Patient complaining of sob for about 4 days. Patient states his throat is burning when he coughs. Patient is obese. Patient was here 03/06/2018 for the same thing. Patient states it feels worse.

## 2018-04-03 NOTE — ED Provider Notes (Signed)
[facility] DEPT Provider Note   CSN: [id number] Arrival date & time: 04/03/18  2039     History   Chief Complaint Chief Complaint  Patient presents with  . Shortness of Breath    HPI Phillip [name] is a 58 y.o. male.  Planes of shortness of breath with cough productive of white sputum and sneeze for the past 4 days, progressively worsening.  Shortness of breath is somewhat worse with lying supine.  Maximum temperature 99.7 degrees.  Also admits to sneeze.  He treated himself with an extra dose of torsemide and with nebulizer with partial relief symptoms did not feel like pulmonary embolism that he had in the past.  HPI  Past Medical History:  Diagnosis Date  . CHF (congestive heart failure) ([place]) 11/15   EF 40-45%, improved with follow-up  . COPD (chronic obstructive pulmonary disease) ([place])   . Gout   . History of DVT (deep vein thrombosis)   . Hypertension   . Hypertensive cardiovascular disease   . Kidney dysfunction   . Morbid obesity ([place])   . OSA on CPAP   . Paroxysmal atrial fibrillation ([facility])   . PNA (pneumonia)   . Pulmonary embolism ([place]) 2010   left leg DVT    Patient Active Problem List   Diagnosis Date Noted  . Preventative health care 02/05/2018  . Atrial flutter ([place]) 06/22/2017  . Anemia 03/23/2017  . Acute rhinitis 03/10/2017  . CHF (congestive heart failure) ([place]) 03/10/2017  . Asthma with COPD ([place])   . Chronic diastolic heart failure ([place])   . Long term (current) use of anticoagulants [Z79.01] 08/17/2015  . Hx pulmonary embolism 08/17/2015  . Deep vein thrombosis (DVT) of lower extremity ([place]) 08/17/2015  . Paroxysmal atrial fibrillation ([place]) 08/04/2015  . SVT (supraventricular tachycardia) ([place]) 06/22/2015  . Dyspnea on exertion 06/20/2015  . Morbid obesity due to excess calories ([place]) 03/07/2015  . Elevated troponin 02/20/2015  . Essential hypertension   . Gout   . Mild persistent asthma in adult without  complication [id number]  . OSA on CPAP 05/06/2014  . Chronic kidney disease, stage IV (severe) ([place]) 05/06/2014  . Asthma with acute exacerbation 05/06/2014    Past Surgical History:  Procedure Laterality Date  . A-FLUTTER ABLATION N/A 07/22/2017   Procedure: A-FLUTTER ABLATION;  Surgeon: Thompson Grayer, MD;  Location: [facility];  Service: Cardiovascular;  Laterality: N/A;  . [place] Medications    Prior to Admission medications   Medication Sig Start Date End Date Taking? Authorizing Provider  albuterol (PROVENTIL) (2.5 MG/3ML) 0.083% nebulizer solution Take 3 mLs (2.5 mg total) by nebulization every 6 (six) hours as needed for wheezing or shortness of breath. 02/05/18   [name], MD  allopurinol (ZYLOPRIM) 300 MG tablet TAKE 1 TABLET (300 MG TOTAL) DAILY BY MOUTH. 01/07/18   [name], MD  budesonide-formoterol (SYMBICORT) 80-4.5 MCG/ACT inhaler Inhale 2 puffs 2 (two) times daily into the lungs. 02/06/17   Tanda Rockers, MD  carvedilol (COREG) 6.25 MG tablet Take 1 tablet (6.25 mg total) by mouth 2 (two) times daily with a meal. 01/27/18   Larey Dresser, MD  cloNIDine (CATAPRES) 0.1 MG tablet TAKE 1 TABLET BY MOUTH THREE TIMES A DAY 12/08/17   [name] I, MD  colchicine 0.6 MG tablet Take 1 tablet (0.6 mg total) by mouth daily as needed (gout). 10/22/17   [name]  I, MD  hydrALAZINE (APRESOLINE) 50 MG tablet Take 1.5 tablets (75 mg total) by mouth 3 (three) times daily. 01/26/18   Shirley Friar, PA-C  montelukast (SINGULAIR) 10 MG tablet TAKE 1 TABLET BY MOUTH EVERYDAY AT BEDTIME Patient taking differently: Take 10 mg by mouth at bedtime.  12/07/17   Tanda Rockers, MD  naproxen sodium (ALEVE) 220 MG tablet Take 440 mg by mouth daily as needed (pain).    [provider]  oxymetazoline (AFRIN NASAL SPRAY) 0.05 % nasal spray Place 1 spray into both nostrils 2 (two) times daily as needed for  congestion. 03/12/17   Oswald Hillock, MD  potassium chloride SA (KLOR-CON M20) 20 MEQ tablet TAKE 2 TABLETS (40 MEQ TOTAL) BY MOUTH DAILY. 01/27/18   Larey Dresser, MD  torsemide (DEMADEX) 20 MG tablet Take 3 tablets (60 mg total) by mouth 2 (two) times daily. 01/27/18 01/22/19  Larey Dresser, MD  UNABLE TO FIND Med Name: CPAP with sleep    [provider]  XARELTO 20 MG TABS tablet TAKE 1 TABLET DAILY WITH SUPPER 04/02/18   Thompson Grayer, MD    Family History Family History  Problem Relation Age of Onset  . Hypertension Mother   . Diabetes Father   . CAD Father   . Stomach cancer Father   . Clotting disorder Father   . Heart disease Father        transplant  . Allergies Brother     Social History Social History   Tobacco Use  . Smoking status: Former Smoker    Packs/day: 0.25    Years: 1.00    Pack years: 0.25    Types: Cigarettes    Last attempt to quit: 07/23/1989    Years since quitting: 28.7  . Smokeless tobacco: Never Used  Substance Use Topics  . Alcohol use: No    Alcohol/week: 0.0 standard drinks  . Drug use: No     Allergies   Other   Review of Systems Review of Systems  HENT: Positive for sneezing.   Respiratory: Positive for cough and shortness of breath.   Cardiovascular: Positive for leg swelling.       BiLateral leg swelling for the past 4 or 5 days  All other systems reviewed and are negative.    Physical Exam Updated Vital Signs BP (!) 179/71 (BP Location: Left Arm)   Pulse 73   Temp 99.3 F (37.4 C) (Oral)   Resp 20   Ht 5\' 9"  (1.753 m)   Wt (!) 173.3 kg   SpO2 98%   BMI 56.41 kg/m   Physical Exam Vitals signs and nursing note reviewed.  Constitutional:      Appearance: He is well-developed. He is not ill-appearing.  HENT:     Head: Normocephalic and atraumatic.  Eyes:     Conjunctiva/sclera: Conjunctivae normal.     Pupils: Pupils are equal, round, and reactive to light.  Neck:     Musculoskeletal: Neck supple.       Thyroid: No thyromegaly.     Trachea: No tracheal deviation.     Comments: No JVD Cardiovascular:     Rate and Rhythm: Normal rate and regular rhythm.     Heart sounds: No murmur.  Pulmonary:     Effort: Pulmonary effort is normal. No respiratory distress.     Breath sounds: Normal breath sounds.  Abdominal:     General: Bowel sounds are normal. There is no distension.  Palpations: Abdomen is soft.     Tenderness: There is no abdominal tenderness.     Comments: Morbidly obese  Musculoskeletal: Normal range of motion.        General: No tenderness.     Right lower leg: Edema present.     Left lower leg: Edema present.     Comments: Plus pretibial pitting edema bilaterally  Skin:    General: Skin is warm and dry.     Findings: No rash.  Neurological:     Mental Status: He is alert.     Coordination: Coordination normal.      ED Treatments / Results  Labs (all labs ordered are listed, but only abnormal results are displayed) Labs Reviewed  BRAIN NATRIURETIC PEPTIDE  BASIC METABOLIC PANEL  CBC WITH DIFFERENTIAL/PLATELET  I-STAT TROPONIN, ED    EKG EKG Interpretation  Date/Time:  Saturday April 03 2018 20:48:30 EST Ventricular Rate:  75 PR Interval:    QRS Duration: 101 QT Interval:  415 QTC Calculation: 464 R Axis:   -32 Text Interpretation:  Sinus rhythm Abnormal R-wave progression, late transition Left ventricular hypertrophy Abnormal T, consider ischemia, lateral leads No significant change since last tracing Confirmed by Orlie Dakin [phone]) on 04/03/2018 9:03:03 PM   Radiology No results found.  Procedures Procedures (including critical care time)  Medications Ordered in ED  Results for orders placed or performed during the hospital encounter of 04/03/18  Brain natriuretic peptide  Result Value Ref Range   B Natriuretic Peptide 615.1 (H) 0.0 - 100.0 pg/mL  Basic metabolic panel  Result Value Ref Range   Sodium 142 135 - 145 mmol/L    Potassium 3.5 3.5 - 5.1 mmol/L   Chloride 105 98 - 111 mmol/L   CO2 29 22 - 32 mmol/L   Glucose, Bld 106 (H) 70 - 99 mg/dL   BUN 25 (H) 6 - 20 mg/dL   Creatinine, Ser 2.08 (H) 0.61 - 1.24 mg/dL   Calcium 9.1 8.9 - 10.3 mg/dL   GFR calc non Af Amer 35 (L) >60 mL/min   GFR calc Af Amer 40 (L) >60 mL/min   Anion gap 8 5 - 15  CBC with Differential/Platelet  Result Value Ref Range   WBC 4.4 4.0 - 10.5 K/uL   RBC 4.13 (L) 4.22 - 5.81 MIL/uL   Hemoglobin 11.5 (L) 13.0 - 17.0 g/dL   HCT 38.2 (L) 39.0 - 52.0 %   MCV 92.5 80.0 - 100.0 fL   MCH 27.8 26.0 - 34.0 pg   MCHC 30.1 30.0 - 36.0 g/dL   RDW 14.5 11.5 - 15.5 %   Platelets 145 (L) 150 - 400 K/uL   nRBC 0.0 0.0 - 0.2 %   Neutrophils Relative % 56 %   Neutro Abs 2.5 1.7 - 7.7 K/uL   Lymphocytes Relative 24 %   Lymphs Abs 1.1 0.7 - 4.0 K/uL   Monocytes Relative 18 %   Monocytes Absolute 0.8 0.1 - 1.0 K/uL   Eosinophils Relative 1 %   Eosinophils Absolute 0.0 0.0 - 0.5 K/uL   Basophils Relative 1 %   Basophils Absolute 0.0 0.0 - 0.1 K/uL   Immature Granulocytes 0 %   Abs Immature Granulocytes 0.01 0.00 - 0.07 K/uL  D-dimer, quantitative (not at [facility]  Result Value Ref Range   D-Dimer, Quant 0.46 0.00 - 0.50 ug/mL-FEU  I-stat troponin, ED  Result Value Ref Range   Troponin i, poc 0.06 0.00 - 0.08 ng/mL   Comment  3           Dg Chest 2 View  Result Date: 04/03/2018 CLINICAL DATA:  Shortness of breath for 4 days. Throat burning. Cough. Similar symptoms on 03/06/2018, now worse. EXAM: CHEST - 2 VIEW COMPARISON:  03/06/2018 FINDINGS: Shallow inspiration. Mild cardiac enlargement. No vascular congestion, edema, or consolidation. No blunting of costophrenic angles. No pneumothorax. Mediastinal contours appear intact. IMPRESSION: Mild cardiac enlargement. No evidence of active pulmonary disease. Electronically Signed   By: Lucienne Capers M.D.   On: 04/03/2018 21:41   Dg Chest 2 View  Result Date: 03/06/2018 CLINICAL DATA:   Shortness of breath for 5 days, history CHF, atrial fibrillation, COPD, pulmonary embolism, pneumonia, former smoker EXAM: CHEST - 2 VIEW COMPARISON:  01/13/2018 FINDINGS: Enlargement of cardiac silhouette. Slight pulmonary vascular congestion. Mediastinal contour stable. Lungs clear. No infiltrate, pleural effusion or pneumothorax. IMPRESSION: Enlargement of cardiac silhouette and slight pulmonary vascular congestion without acute infiltrate. Electronically Signed   By: Lavonia Dana M.D.   On: 03/06/2018 19:22   Medications  albuterol (PROVENTIL) (2.5 MG/3ML) 0.083% nebulizer solution 5 mg (has no administration in time range)   12 AM after second DuoNeb treatment and treatment with intravenous Lasix patient states his breathing is improved.  Not quite at baseline.  At this point he does not wish to have a third nebulized treatment because it makes him feel "jumpy" we will continue to observe.  Patient is in no respiratory distress and speaks in paragraphs presently.  Dyspnea is felt secondary to mild exacerbation of COPD and CHF.  Low pretest clinical probability for pulmonary embolism.  Negative d-dimer Chest x-ray viewed by me  Patient is signed out to Dr. Dayna Barker 12:30 AM.  Initial Impression / Assessment and Plan / ED Course  I have reviewed the triage vital signs and the nursing notes.  Pertinent labs & imaging results that were available during my care of the patient were reviewed by me and considered in my medical decision making (see chart for details).    Lab work is remarkable for mild anemia, and renal insufficiency which is chronic.  BNP mildly elevated over baseline, consistent with CHF  Strongly doubt patient having symptoms from anemia. Final Clinical Impressions(s) / ED Diagnoses  Diagnosis #1 dyspnea #2 chronic renal sufficiency #3 anemia Final diagnoses:  None  CRITICAL CARE Performed by: Orlie Dakin Total critical care time: 35 minutes Critical care time was exclusive  of separately billable procedures and treating other patients. Critical care was necessary to treat or prevent imminent or life-threatening deterioration. Critical care was time spent personally by me on the following activities: development of treatment plan with patient and/or surrogate as well as nursing, discussions with consultants, evaluation of patient's response to treatment, examination of patient, obtaining history from patient or surrogate, ordering and performing treatments and interventions, ordering and review of laboratory studies, ordering and review of radiographic studies, pulse oximetry and re-evaluation of patient's condition.  ED Discharge Orders    None       Orlie Dakin, MD 04/04/18 [phone]

## 2018-04-04 MED ORDER — AZITHROMYCIN 250 MG PO TABS: 250.0000 mg | ORAL_TABLET | Freq: Every day | ORAL | 0 refills | Status: DC

## 2018-04-04 MED ORDER — BENZONATATE 100 MG PO CAPS: 100.0000 mg | ORAL_CAPSULE | Freq: Three times a day (TID) | ORAL | 0 refills | Status: DC | PRN

## 2018-04-04 NOTE — ED Provider Notes (Signed)
12:50 AM Assumed care from Dr. Winfred Leeds, please see their note for full history, physical and decision making until this point. In brief this is a 58 y.o. year old male who presented to the ED tonight with Shortness of Breath     CHF vs COPD, needs reassessment.    On reassess, ambulated without much difficulty. No hypoxia. Workup unremarkable. Will dc on same meds for bronchitis and will fu w/ pcp.  Discharge instructions, including strict return precautions for new or worsening symptoms, given. Patient and/or family verbalized understanding and agreement with the plan as described.   Labs, studies and imaging reviewed by myself and considered in medical decision making if ordered. Imaging interpreted by radiology.  Labs Reviewed  BRAIN NATRIURETIC PEPTIDE - Abnormal; Notable for the following components:      Result Value   B Natriuretic Peptide 615.1 (*)    All other components within normal limits  BASIC METABOLIC PANEL - Abnormal; Notable for the following components:   Glucose, Bld 106 (*)    BUN 25 (*)    Creatinine, Ser 2.08 (*)    GFR calc non Af Amer 35 (*)    GFR calc Af Amer 40 (*)    All other components within normal limits  CBC WITH DIFFERENTIAL/PLATELET - Abnormal; Notable for the following components:   RBC 4.13 (*)    Hemoglobin 11.5 (*)    HCT 38.2 (*)    Platelets 145 (*)    All other components within normal limits  D-DIMER, QUANTITATIVE (NOT AT [facility])  I-STAT TROPONIN, ED    DG Chest 2 View  Final Result      No follow-ups on file.    Idara [place]side, Corene Cornea, MD 04/04/18 [phone]

## 2018-04-04 NOTE — ED Notes (Signed)
Pt was ambulated and constantly desat to 89% while ambulating without talking. Pt's saturation would then increase but soon after decrease again. Pt sated at 95% at the highest. Pt had labored breathing without wheezing while ambulating.

## 2018-04-07 ENCOUNTER — Telehealth: Payer: Self-pay

## 2018-04-07 NOTE — Telephone Encounter (Signed)
Spoke to pt. He said he is doing a little better since his ER visit. Still coughing a lot. He is coming in on the 17th.

## 2018-04-09 ENCOUNTER — Encounter: Payer: Self-pay | Admitting: Internal Medicine

## 2018-04-09 ENCOUNTER — Ambulatory Visit: Payer: Managed Care, Other (non HMO) | Admitting: Internal Medicine

## 2018-04-09 VITALS — BP 112/70 | HR 68 | Temp 98.3°F | Ht 67.0 in | Wt 381.0 lb

## 2018-04-09 DIAGNOSIS — I5032 Chronic diastolic (congestive) heart failure: Secondary | ICD-10-CM | POA: Diagnosis not present

## 2018-04-09 DIAGNOSIS — J209 Acute bronchitis, unspecified: Secondary | ICD-10-CM

## 2018-04-09 DIAGNOSIS — J440 Chronic obstructive pulmonary disease with acute lower respiratory infection: Secondary | ICD-10-CM

## 2018-04-09 NOTE — Assessment & Plan Note (Signed)
Unclear if bacterial infection Given z-pak but still lingering symptoms --though better Clearly seems to be component of COPD exacerbation Not tight now and improving Continue albuterol nebs If worsens next week--would try a few days of prednisone

## 2018-04-09 NOTE — Progress Notes (Signed)
Subjective:    Patient ID: Phillip [name], male    DOB: 26-Feb-1961, 58 y.o.   MRN: [id number]  HPI Here for ER follow up  Had cold symptoms for several days Then got worse--- SOB and low grade fever Some cough-- had been dry, but now starting to bring up stuff Chest pain with cough Head congestion/pressure  Went to ER Records reviewed Diagnoses with bronchitis Given azithromycin and benzonatate Still hears rattling in chest  Only wheezes when lying on his side---has done some neb Rx with some help  Current Outpatient Medications on File Prior to Visit  Medication Sig Dispense Refill  . albuterol (PROVENTIL) (2.5 MG/3ML) 0.083% nebulizer solution Take 3 mLs (2.5 mg total) by nebulization every 6 (six) hours as needed for wheezing or shortness of breath. 360 mL 11  . allopurinol (ZYLOPRIM) 300 MG tablet TAKE 1 TABLET (300 MG TOTAL) DAILY BY MOUTH. 30 tablet 11  . benzonatate (TESSALON) 100 MG capsule Take 1 capsule (100 mg total) by mouth 3 (three) times daily as needed for cough. 21 capsule 0  . budesonide-formoterol (SYMBICORT) 80-4.5 MCG/ACT inhaler Inhale 2 puffs 2 (two) times daily into the lungs. 3 Inhaler 3  . carvedilol (COREG) 6.25 MG tablet Take 1 tablet (6.25 mg total) by mouth 2 (two) times daily with a meal. 180 tablet 1  . cloNIDine (CATAPRES) 0.1 MG tablet TAKE 1 TABLET BY MOUTH THREE TIMES A DAY 90 tablet 3  . colchicine 0.6 MG tablet Take 1 tablet (0.6 mg total) by mouth daily as needed (gout). 30 tablet 1  . hydrALAZINE (APRESOLINE) 50 MG tablet Take 1.5 tablets (75 mg total) by mouth 3 (three) times daily. 135 tablet 1  . montelukast (SINGULAIR) 10 MG tablet TAKE 1 TABLET BY MOUTH EVERYDAY AT BEDTIME (Patient taking differently: Take 10 mg by mouth at bedtime. ) 30 tablet 5  . naproxen sodium (ALEVE) 220 MG tablet Take 440 mg by mouth daily as needed (pain).    [name] oxymetazoline (AFRIN NASAL SPRAY) 0.05 % nasal spray Place 1 spray into both nostrils 2 (two) times daily as  needed for congestion.    . potassium chloride SA (KLOR-CON M20) 20 MEQ tablet TAKE 2 TABLETS (40 MEQ TOTAL) BY MOUTH DAILY. 60 tablet 5  . torsemide (DEMADEX) 20 MG tablet Take 3 tablets (60 mg total) by mouth 2 (two) times daily. 540 tablet 3  . UNABLE TO FIND Med Name: CPAP with sleep    . XARELTO 20 MG TABS tablet TAKE 1 TABLET DAILY WITH SUPPER (Patient taking differently: Take 20 mg by mouth daily with supper. ) 90 tablet 4   No current facility-administered medications on file prior to visit.     Allergies  Allergen Reactions  . Other Anaphylaxis    mushrooms    Past Medical History:  Diagnosis Date  . CHF (congestive heart failure) ([place]) 11/15   EF 40-45%, improved with follow-up  . COPD (chronic obstructive pulmonary disease) ([place])   . Gout   . History of DVT (deep vein thrombosis)   . Hypertension   . Hypertensive cardiovascular disease   . Kidney dysfunction   . Morbid obesity ([place])   . OSA on CPAP   . Paroxysmal atrial fibrillation ([facility])   . PNA (pneumonia)   . Pulmonary embolism ([place]) 2010   left leg DVT    Past Surgical History:  Procedure Laterality Date  . A-FLUTTER ABLATION N/A 07/22/2017   Procedure: A-FLUTTER ABLATION;  Surgeon: Thompson Grayer, MD;  Location: [facility];  Service: Cardiovascular;  Laterality: N/A;  . UMBILICAL HERNIA REPAIR  1992    Family History  Problem Relation Age of Onset  . Hypertension Mother   . Diabetes Father   . CAD Father   . Stomach cancer Father   . Clotting disorder Father   . Heart disease Father        transplant  . Allergies Brother     Social History   Socioeconomic History  . Marital status: Divorced    Spouse name: Not on file  . Number of children: 2  . Years of education: 68  . Highest education level: Not on file  Occupational History  . Occupation: Drives dump truck    Comment: Part time  . Occupation: Airline pilot    Comment: Disabled  Social Needs  . Financial resource strain: Not  hard at all  . Food insecurity:    Worry: Never true    Inability: Never true  . Transportation needs:    Medical: No    Non-medical: No  Tobacco Use  . Smoking status: Former Smoker    Packs/day: 0.25    Years: 1.00    Pack years: 0.25    Types: Cigarettes    Last attempt to quit: 07/23/1989    Years since quitting: 28.7  . Smokeless tobacco: Never Used  Substance and Sexual Activity  . Alcohol use: No    Alcohol/week: 0.0 standard drinks  . Drug use: No  . Sexual activity: Not Currently  Lifestyle  . Physical activity:    Days per week: Not on file    Minutes per session: Not on file  . Stress: Not on file  Relationships  . Social connections:    Talks on phone: Not on file    Gets together: Not on file    Attends religious service: Not on file    Active member of club or organization: Not on file    Attends meetings of clubs or organizations: Not on file    Relationship status: Not on file  . Intimate partner violence:    Fear of current or ex partner: Not on file    Emotionally abused: Not on file    Physically abused: Not on file    Forced sexual activity: Not on file  Other Topics Concern  . Not on file  Social History Narrative   Pt lives in [place], alone.   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   Has 2 sons in [place]      No living will   Would want sons to make decisions for him   Would accept resuscitation   Review of Systems No N/V No diarrhea Eating fair Has noticed some increased edema--seems better now    Objective:   Physical Exam  Constitutional: He appears well-developed. No distress.  HENT:  Mouth/Throat: Oropharynx is clear and moist. No oropharyngeal exudate.  No sinus tenderness TMs normal Mild nasal congestion  Neck: No thyromegaly present.  Respiratory: Effort normal. No respiratory distress. He has no rales.  Marked decreased breath sounds Slight exp wheeze but not tight  Lymphadenopathy:     He has no cervical adenopathy.           Assessment & Plan:

## 2018-04-09 NOTE — Assessment & Plan Note (Signed)
Some edema before ER--but better now No apparent exacerbation

## 2018-04-11 ENCOUNTER — Other Ambulatory Visit: Payer: Self-pay | Admitting: Internal Medicine

## 2018-04-13 ENCOUNTER — Telehealth: Payer: Self-pay | Admitting: *Deleted

## 2018-04-13 MED ORDER — PREDNISONE 20 MG PO TABS: 40.0000 mg | ORAL_TABLET | Freq: Every day | ORAL | 0 refills | Status: DC

## 2018-04-13 NOTE — Telephone Encounter (Signed)
Spoke to pt

## 2018-04-13 NOTE — Telephone Encounter (Signed)
Spoke to pt who states he was seen on 01/17 and advised to contact office back with an update. He states that his cough is still present and is has not had much improvement with the tessalon. He states he was advised make Dr Silvio Pate aware if he was needing a steroid and zpack, which he is now requesting be sent to Phillip [name]. pls advise

## 2018-04-13 NOTE — Telephone Encounter (Signed)
Let him know the plan was for the prednisone (which I sent), not repeating the antibiotic

## 2018-04-20 ENCOUNTER — Other Ambulatory Visit (HOSPITAL_COMMUNITY): Payer: Self-pay

## 2018-04-20 DIAGNOSIS — E876 Hypokalemia: Secondary | ICD-10-CM

## 2018-04-20 MED ORDER — POTASSIUM CHLORIDE CRYS ER 20 MEQ PO TBCR: EXTENDED_RELEASE_TABLET | ORAL | 5 refills | Status: DC

## 2018-04-21 ENCOUNTER — Other Ambulatory Visit: Payer: Self-pay

## 2018-04-21 ENCOUNTER — Encounter (HOSPITAL_COMMUNITY): Payer: Self-pay

## 2018-04-21 ENCOUNTER — Ambulatory Visit (HOSPITAL_COMMUNITY)
Admission: RE | Admit: 2018-04-21 | Discharge: 2018-04-21 | Disposition: A | Discharge status: Home / Self Care | Payer: Managed Care, Other (non HMO) | Source: Ambulatory Visit | Attending: Internal Medicine | Admitting: Internal Medicine

## 2018-04-21 VITALS — BP 126/72 | HR 92 | Wt 382.6 lb

## 2018-04-21 DIAGNOSIS — Z91018 Allergy to other foods: Secondary | ICD-10-CM | POA: Diagnosis not present

## 2018-04-21 DIAGNOSIS — I480 Paroxysmal atrial fibrillation: Secondary | ICD-10-CM | POA: Diagnosis not present

## 2018-04-21 DIAGNOSIS — I4892 Unspecified atrial flutter: Secondary | ICD-10-CM | POA: Insufficient documentation

## 2018-04-21 DIAGNOSIS — Z7901 Long term (current) use of anticoagulants: Secondary | ICD-10-CM | POA: Diagnosis not present

## 2018-04-21 DIAGNOSIS — I5032 Chronic diastolic (congestive) heart failure: Secondary | ICD-10-CM | POA: Diagnosis not present

## 2018-04-21 DIAGNOSIS — Z87891 Personal history of nicotine dependence: Secondary | ICD-10-CM | POA: Insufficient documentation

## 2018-04-21 DIAGNOSIS — Z79899 Other long term (current) drug therapy: Secondary | ICD-10-CM | POA: Diagnosis not present

## 2018-04-21 DIAGNOSIS — Z86711 Personal history of pulmonary embolism: Secondary | ICD-10-CM | POA: Insufficient documentation

## 2018-04-21 DIAGNOSIS — J449 Chronic obstructive pulmonary disease, unspecified: Secondary | ICD-10-CM | POA: Diagnosis not present

## 2018-04-21 DIAGNOSIS — I5033 Acute on chronic diastolic (congestive) heart failure: Secondary | ICD-10-CM | POA: Diagnosis not present

## 2018-04-21 DIAGNOSIS — I351 Nonrheumatic aortic (valve) insufficiency: Secondary | ICD-10-CM | POA: Insufficient documentation

## 2018-04-21 DIAGNOSIS — Z833 Family history of diabetes mellitus: Secondary | ICD-10-CM | POA: Diagnosis not present

## 2018-04-21 DIAGNOSIS — Z86718 Personal history of other venous thrombosis and embolism: Secondary | ICD-10-CM | POA: Insufficient documentation

## 2018-04-21 DIAGNOSIS — Z6841 Body Mass Index (BMI) 40.0 and over, adult: Secondary | ICD-10-CM | POA: Diagnosis not present

## 2018-04-21 DIAGNOSIS — Z8489 Family history of other specified conditions: Secondary | ICD-10-CM | POA: Diagnosis not present

## 2018-04-21 DIAGNOSIS — M109 Gout, unspecified: Secondary | ICD-10-CM | POA: Insufficient documentation

## 2018-04-21 DIAGNOSIS — Z8249 Family history of ischemic heart disease and other diseases of the circulatory system: Secondary | ICD-10-CM | POA: Diagnosis not present

## 2018-04-21 DIAGNOSIS — Z9989 Dependence on other enabling machines and devices: Secondary | ICD-10-CM

## 2018-04-21 DIAGNOSIS — Z7952 Long term (current) use of systemic steroids: Secondary | ICD-10-CM | POA: Diagnosis not present

## 2018-04-21 DIAGNOSIS — N184 Chronic kidney disease, stage 4 (severe): Secondary | ICD-10-CM | POA: Diagnosis not present

## 2018-04-21 DIAGNOSIS — I130 Hypertensive heart and chronic kidney disease with heart failure and stage 1 through stage 4 chronic kidney disease, or unspecified chronic kidney disease: Secondary | ICD-10-CM | POA: Diagnosis present

## 2018-04-21 DIAGNOSIS — I10 Essential (primary) hypertension: Secondary | ICD-10-CM

## 2018-04-21 DIAGNOSIS — G4733 Obstructive sleep apnea (adult) (pediatric): Secondary | ICD-10-CM | POA: Diagnosis not present

## 2018-04-21 NOTE — Patient Instructions (Addendum)
INCREASE Torsemide to 80mg  (4 tabs) twice a day for 2 days, THEN resume Torsemide 60mg  (3 tabs) twice a day  Your physician has requested that you have an echocardiogram. Echocardiography is a painless test that uses sound waves to create images of your heart. It provides your doctor with information about the size and shape of your heart and how well your heart's chambers and valves are working. This procedure takes approximately one hour. There are no restrictions for this procedure.  Your physician recommends that you schedule a follow-up appointment in: 3-4 months with an ECHO and visit with Dr. Haroldine Laws.

## 2018-04-21 NOTE — Progress Notes (Signed)
Advanced Heart Failure Clinic Note   Referring Physician: Tamala [name] Primary Care: Phillip [name] Primary Cardiologist: Phillip [name]  EP: Dr Phillip [name] HF MD: Dr Phillip [name]   HPI: Phillip [name] is a 58 y.o. male hx of PAF, remote PE, hx of DVT, OSA, morbid obesity, CKD Stage III,  HTN, and  combined chronic systolic/diastolic. S/P A Flutter ablation May 2019 .    Echo 12/21/16 with EF 55-60% G2DD, mild aortic regurgitation. Last nuc 2008 no ischemia, no hx of cath.   He has had multiple admissions to the hospital for volume overload/acute on chronic diastolic heart failure. Last admit in 12/18 d/c weight was 368. Lowest weight in last few month recorded 353.   Today he returns for HF follow up due to increased dyspnea. Currently being treated for bronchitis with steroids. Overall feeling fair. SOB with exertion. Denies PND/orthopnea. Appetite ok. No fever or chills. Using CPAP every night. Weight at home has been trendnig up since he started prednisone. Taking all medications. Continues to work full time.    Review of systems complete and found to be negative unless listed in HPI.    Past Medical History:  Diagnosis Date  . CHF (congestive heart failure) ([place]) 11/15   EF 40-45%, improved with follow-up  . COPD (chronic obstructive pulmonary disease) ([place])   . Gout   . History of DVT (deep vein thrombosis)   . Hypertension   . Hypertensive cardiovascular disease   . Kidney dysfunction   . Morbid obesity ([place])   . OSA on CPAP   . Paroxysmal atrial fibrillation ([facility])   . PNA (pneumonia)   . Pulmonary embolism ([place]) 2010   left leg DVT   Current Outpatient Medications  Medication Sig Dispense Refill  . albuterol (PROVENTIL) (2.5 MG/3ML) 0.083% nebulizer solution Take 3 mLs (2.5 mg total) by nebulization every 6 (six) hours as needed for wheezing or shortness of breath. 360 mL 11  . allopurinol (ZYLOPRIM) 300 MG tablet TAKE 1 TABLET (300 MG TOTAL) DAILY BY MOUTH. 30 tablet 11  . carvedilol (COREG) 6.25  MG tablet Take 1 tablet (6.25 mg total) by mouth 2 (two) times daily with a meal. 180 tablet 1  . cloNIDine (CATAPRES) 0.1 MG tablet TAKE 1 TABLET BY MOUTH THREE TIMES A DAY 90 tablet 3  . colchicine 0.6 MG tablet Take 1 tablet (0.6 mg total) by mouth daily as needed (gout). 30 tablet 1  . hydrALAZINE (APRESOLINE) 50 MG tablet Take 1.5 tablets (75 mg total) by mouth 3 (three) times daily. 135 tablet 1  . montelukast (SINGULAIR) 10 MG tablet TAKE 1 TABLET BY MOUTH EVERYDAY AT BEDTIME (Patient taking differently: Take 10 mg by mouth at bedtime. ) 30 tablet 5  . oxymetazoline (AFRIN NASAL SPRAY) 0.05 % nasal spray Place 1 spray into both nostrils 2 (two) times daily as needed for congestion.    . potassium chloride SA (KLOR-CON M20) 20 MEQ tablet TAKE 2 TABLETS (40 MEQ TOTAL) BY MOUTH DAILY. 60 tablet 5  . predniSONE (DELTASONE) 20 MG tablet Take 2 tablets (40 mg total) by mouth daily. For 5 days, then 1 tab daily for 5 days 15 tablet 0  . SYMBICORT 80-4.5 MCG/ACT inhaler USE 2 INHALATIONS TWICE A DAY 30.6 g 0  . torsemide (DEMADEX) 20 MG tablet Take 3 tablets (60 mg total) by mouth 2 (two) times daily. 540 tablet 3  . UNABLE TO FIND Med Name: CPAP with sleep    . XARELTO 20 MG TABS tablet TAKE 1 TABLET  DAILY WITH SUPPER (Patient taking differently: Take 20 mg by mouth daily with supper. ) 90 tablet 4   No current facility-administered medications for this encounter.    Allergies  Allergen Reactions  . Other Anaphylaxis    mushrooms    Social History   Socioeconomic History  . Marital status: Divorced    Spouse name: Not on file  . Number of children: 2  . Years of education: 9  . Highest education level: Not on file  Occupational History  . Occupation: Drives dump truck    Comment: Part time  . Occupation: Airline pilot    Comment: Disabled  Social Needs  . Financial resource strain: Not hard at all  . Food insecurity:    Worry: Never true    Inability: Never true  .  Transportation needs:    Medical: No    Non-medical: No  Tobacco Use  . Smoking status: Former Smoker    Packs/day: 0.25    Years: 1.00    Pack years: 0.25    Types: Cigarettes    Last attempt to quit: 07/23/1989    Years since quitting: 28.7  . Smokeless tobacco: Never Used  Substance and Sexual Activity  . Alcohol use: No    Alcohol/week: 0.0 standard drinks  . Drug use: No  . Sexual activity: Not Currently  Lifestyle  . Physical activity:    Days per week: Not on file    Minutes per session: Not on file  . Stress: Not on file  Relationships  . Social connections:    Talks on phone: Not on file    Gets together: Not on file    Attends religious service: Not on file    Active member of club or organization: Not on file    Attends meetings of clubs or organizations: Not on file    Relationship status: Not on file  . Intimate partner violence:    Fear of current or ex partner: Not on file    Emotionally abused: Not on file    Physically abused: Not on file    Forced sexual activity: Not on file  Other Topics Concern  . Not on file  Social History Narrative   Pt lives in [place], alone.   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   Has 2 sons in [place]      No living will   Would want sons to make decisions for him   Would accept resuscitation    Family History  Problem Relation Age of Onset  . Hypertension Mother   . Diabetes Father   . CAD Father   . Stomach cancer Father   . Clotting disorder Father   . Heart disease Father        transplant  . Allergies Brother    Vitals:   04/21/18 1108  BP: 126/72  Pulse: 92  SpO2: 97%  Weight: (!) 173.5 kg (382 lb 9.6 oz)   Wt Readings from Last 3 Encounters:  04/21/18 (!) 173.5 kg (382 lb 9.6 oz)  04/09/18 (!) 172.8 kg (381 lb)  04/03/18 (!) 173.3 kg (382 lb)   PHYSICAL EXAM: General:   No resp difficulty. Walked into the clinic. HEENT: normal Neck: supple. JVP elevated.  Carotids 2+ bilat; no bruits. No lymphadenopathy or thryomegaly appreciated. Cor: PMI nondisplaced. Regular rate & rhythm. No rubs, gallops or murmurs. Lungs: clear Abdomen: obese, soft, nontender, nondistended. No hepatosplenomegaly. No bruits or masses. Good  bowel sounds. Extremities: no cyanosis, clubbing, rash, Ra dn LLE 1+ edema Neuro: alert & orientedx3, cranial nerves grossly intact. moves all 4 extremities w/o difficulty. Affect pleasant  ASSESSMENT & PLAN: 1. Chronic diastolic HF - Echo 04/4399 LVEF 55-60%, Grade 2 DD.  - NYHA III. Volume status elevated likely due to steroids.  - Increase torsemide to 80 mg twice a day for 2 days the back to 60 mg twice a day.  - Continue spiro 25 mg daily.  2. PAF / Typical A-flutter, paroxysmal - S/P AFL ablation 07/2017. - Continue Xarelto 20 mg daily.  - No bleeding issues.    3. Hypertension Stable continue current regimen.   6. Morbid obesity - Body mass index is 59.92 kg/m.  Discussed portion control   5. CKD IV - Creatinine baseline ~2.  Most recent creatinine 2.1 on 04/03/2018   6. OSA on CPAP - Encouraged nightly CPAP use.  Continue nightly.   I walked him in the clinic and his oxygen saturation remained 96% on room. He does not need continuous oxygen. Discussed sliding scale diuretics. Instructed to take an 40 mg torsemide if weight goes up 3-5 pounds in 24 hours or if he is put back on steroids.   Follow up in 3-4 months with Dr Phillip [name].   Greater than 50% of the (total minutes 25) visit spent in counseling/coordination of care regarding the above.    Darrick Grinder, NP  11:17 AM

## 2018-04-22 ENCOUNTER — Other Ambulatory Visit (HOSPITAL_COMMUNITY): Payer: Self-pay

## 2018-04-28 ENCOUNTER — Ambulatory Visit: Payer: Managed Care, Other (non HMO) | Admitting: Internal Medicine

## 2018-04-30 ENCOUNTER — Ambulatory Visit (INDEPENDENT_AMBULATORY_CARE_PROVIDER_SITE_OTHER)
Admission: RE | Admit: 2018-04-30 | Discharge: 2018-04-30 | Disposition: A | Discharge status: Home / Self Care | Payer: Managed Care, Other (non HMO) | Source: Ambulatory Visit | Attending: Internal Medicine | Admitting: Internal Medicine

## 2018-04-30 ENCOUNTER — Ambulatory Visit: Payer: Managed Care, Other (non HMO) | Admitting: Internal Medicine

## 2018-04-30 ENCOUNTER — Encounter: Payer: Self-pay | Admitting: Internal Medicine

## 2018-04-30 VITALS — BP 146/90 | HR 90 | Ht 69.0 in | Wt 389.0 lb

## 2018-04-30 VITALS — BP 138/86 | HR 80 | Temp 98.1°F | Ht 69.0 in | Wt 387.0 lb

## 2018-04-30 DIAGNOSIS — Q383 Other congenital malformations of tongue: Secondary | ICD-10-CM | POA: Diagnosis not present

## 2018-04-30 DIAGNOSIS — J4530 Mild persistent asthma, uncomplicated: Secondary | ICD-10-CM | POA: Diagnosis not present

## 2018-04-30 DIAGNOSIS — G4733 Obstructive sleep apnea (adult) (pediatric): Secondary | ICD-10-CM

## 2018-04-30 DIAGNOSIS — Z9989 Dependence on other enabling machines and devices: Secondary | ICD-10-CM

## 2018-04-30 MED ORDER — FAMOTIDINE 20 MG PO TABS: ORAL_TABLET | ORAL | 11 refills | Status: DC

## 2018-04-30 MED ORDER — PANTOPRAZOLE SODIUM 40 MG PO TBEC: 40.0000 mg | DELAYED_RELEASE_TABLET | Freq: Every day | ORAL | 2 refills | Status: DC

## 2018-04-30 MED ORDER — PREDNISONE 10 MG PO TABS: ORAL_TABLET | ORAL | 0 refills | Status: DC

## 2018-04-30 MED ORDER — AMOXICILLIN-POT CLAVULANATE 875-125 MG PO TABS: 1.0000 | ORAL_TABLET | Freq: Two times a day (BID) | ORAL | 0 refills | Status: AC

## 2018-04-30 NOTE — Assessment & Plan Note (Signed)
Not infectious Likely just prominent papillae If worsens, or increased pain--will set up with ENT

## 2018-04-30 NOTE — Patient Instructions (Signed)
Augmentin 875 mg take one pill twice daily  X 10 days - take at breakfast and supper with large glass of water.  It would help reduce the usual side effects (diarrhea and yeast infections) if you ate cultured yogurt at lunch.   Prednisone 10 mg take  4 each am x 2 days,   2 each am x 2 days,  1 each am x 2 days and stop   For cough > delsym tsp every 12 hours as needed   Pantoprazole (protonix) 40 mg   Take  30-60 min before first meal of the day and Pepcid (famotidine)  20 mg one after  supper  until return to office - this is the best way to tell whether stomach acid is contributing to your problem.    GERD (REFLUX)  is an extremely common cause of respiratory symptoms just like yours , many times with no obvious heartburn at all.    It can be treated with medication, but also with lifestyle changes including elevation of the head of your bed (ideally with 6 inch  bed blocks),  Smoking cessation, avoidance of late meals, excessive alcohol, and avoid fatty foods, chocolate, peppermint, colas, red wine, and acidic juices such as orange juice.  NO MINT OR MENTHOL PRODUCTS SO NO COUGH DROPS   USE SUGARLESS CANDY INSTEAD (Jolley ranchers or Stover's or Life Savers) or even ice chips will also do - the key is to swallow to prevent all throat clearing. NO OIL BASED VITAMINS - use powdered substitutes.   Please remember to go to the  x-ray department  for your tests - we will call you with the results when they are available    Please schedule a follow up office visit in 2 weeks, sooner if needed  with all medications /inhalers/ solutions in hand so we can verify exactly what you are taking. This includes all medications from all doctors and over the counters

## 2018-04-30 NOTE — Progress Notes (Signed)
Subjective:    Patient ID: Phillip [name], male    DOB: 03/11/1961, 58 y.o.   MRN: [id number]  HPI Here due to respiratory infection  Woke with scratchy throat---then noticed some bumps on the back of his tongue Tried some peroxide to rinse--but it bothered him. Slight pain This was a week ago  Has ongoing cold Still some cough--- productive of clear phlegm DOE --using his inhaler and nebs No fever  Current Outpatient Medications on File Prior to Visit  Medication Sig Dispense Refill  . albuterol (PROVENTIL) (2.5 MG/3ML) 0.083% nebulizer solution Take 3 mLs (2.5 mg total) by nebulization every 6 (six) hours as needed for wheezing or shortness of breath. 360 mL 11  . allopurinol (ZYLOPRIM) 300 MG tablet TAKE 1 TABLET (300 MG TOTAL) DAILY BY MOUTH. 30 tablet 11  . carvedilol (COREG) 6.25 MG tablet Take 1 tablet (6.25 mg total) by mouth 2 (two) times daily with a meal. 180 tablet 1  . cloNIDine (CATAPRES) 0.1 MG tablet TAKE 1 TABLET BY MOUTH THREE TIMES A DAY 90 tablet 3  . colchicine 0.6 MG tablet Take 1 tablet (0.6 mg total) by mouth daily as needed (gout). 30 tablet 1  . hydrALAZINE (APRESOLINE) 50 MG tablet Take 1.5 tablets (75 mg total) by mouth 3 (three) times daily. 135 tablet 1  . montelukast (SINGULAIR) 10 MG tablet TAKE 1 TABLET BY MOUTH EVERYDAY AT BEDTIME (Patient taking differently: Take 10 mg by mouth at bedtime. ) 30 tablet 5  . oxymetazoline (AFRIN NASAL SPRAY) 0.05 % nasal spray Place 1 spray into both nostrils 2 (two) times daily as needed for congestion.    . potassium chloride SA (KLOR-CON M20) 20 MEQ tablet TAKE 2 TABLETS (40 MEQ TOTAL) BY MOUTH DAILY. 60 tablet 5  . SYMBICORT 80-4.5 MCG/ACT inhaler USE 2 INHALATIONS TWICE A DAY 30.6 g 0  . torsemide (DEMADEX) 20 MG tablet Take 3 tablets (60 mg total) by mouth 2 (two) times daily. 540 tablet 3  . UNABLE TO FIND Med Name: CPAP with sleep    . XARELTO 20 MG TABS tablet TAKE 1 TABLET DAILY WITH SUPPER (Patient taking  differently: Take 20 mg by mouth daily with supper. ) 90 tablet 4   No current facility-administered medications on file prior to visit.     Allergies  Allergen Reactions  . Other Anaphylaxis    mushrooms    Past Medical History:  Diagnosis Date  . CHF (congestive heart failure) ([facility] [facility]) 11/15   EF 40-45%, improved with follow-up  . COPD (chronic obstructive pulmonary disease) ([facility])   . Gout   . History of DVT (deep vein thrombosis)   . Hypertension   . Hypertensive cardiovascular disease   . Kidney dysfunction   . Morbid obesity ([place])   . OSA on CPAP   . Paroxysmal atrial fibrillation ([facility])   . PNA (pneumonia)   . Pulmonary embolism ([place]) 2010   left leg DVT    Past Surgical History:  Procedure Laterality Date  . A-FLUTTER ABLATION N/A 07/22/2017   Procedure: A-FLUTTER ABLATION;  Surgeon: Thompson Grayer, MD;  Location: [facility];  Service: Cardiovascular;  Laterality: N/A;  . UMBILICAL HERNIA REPAIR  1992    Family History  Problem Relation Age of Onset  . Hypertension Mother   . Diabetes Father   . CAD Father   . Stomach cancer Father   . Clotting disorder Father   . Heart disease Father  transplant  . Allergies Brother     Social History   Socioeconomic History  . Marital status: Divorced    Spouse name: Not on file  . Number of children: 2  . Years of education: 39  . Highest education level: Not on file  Occupational History  . Occupation: Drives dump truck    Comment: Part time  . Occupation: Airline pilot    Comment: Disabled  Social Needs  . Financial resource strain: Not hard at all  . Food insecurity:    Worry: Never true    Inability: Never true  . Transportation needs:    Medical: No    Non-medical: No  Tobacco Use  . Smoking status: Former Smoker    Packs/day: 0.25    Years: 1.00    Pack years: 0.25    Types: Cigarettes    Last attempt to quit: 07/23/1989    Years since quitting: 28.7  . Smokeless tobacco: Never Used    Substance and Sexual Activity  . Alcohol use: No    Alcohol/week: 0.0 standard drinks  . Drug use: No  . Sexual activity: Not Currently  Lifestyle  . Physical activity:    Days per week: Not on file    Minutes per session: Not on file  . Stress: Not on file  Relationships  . Social connections:    Talks on phone: Not on file    Gets together: Not on file    Attends religious service: Not on file    Active member of club or organization: Not on file    Attends meetings of clubs or organizations: Not on file    Relationship status: Not on file  . Intimate partner violence:    Fear of current or ex partner: Not on file    Emotionally abused: Not on file    Physically abused: Not on file    Forced sexual activity: Not on file  Other Topics Concern  . Not on file  Social History Narrative   Pt lives in [place], alone.   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   Has 2 sons in [place]      No living will   Would want sons to make decisions for him   Would accept resuscitation   Review of Systems  No rash on hands or feet Appetite is okay Slight nausea at times---related to the prednisone     Objective:   Physical Exam  HENT:  Mouth/Throat: Oropharynx is clear and moist. No oropharyngeal exudate.  Several 1-67mm raised areas on back of tongue (look like prominent papillae) No induration or mass palpable  Neck: No thyromegaly present.  Respiratory:  Decreased breath sounds but clear  Lymphadenopathy:    He has no cervical adenopathy.           Assessment & Plan:

## 2018-04-30 NOTE — Progress Notes (Signed)
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 Acute Visit    Pt states every morning he hears a rattling in his chest, still having the sob while walking, still has an occ. cough but mainly non productive, only has chest tightness when he exerts himself, he states he does has some chest conegstion Denies fever, pt unsure if his recent trip to [place] has anything to do with his symtpoms   worse x 3 weeks but better since in ER with dx with chf 09/15/16 and sleeping fine on cpap  Much better p lasix Some better after saba but mostly using neb and skipping hfa saba in action paln  Minimal rattling better p extra dose of diuretic rec Plan A = Automatic = Symbicort 80 Take 2 puffs first thing in am and then another 2 puffs about 12 hours later.  Plan B = Backup Only use your albuterol as a rescue medication to be used if you can't catch your breath Plan C = Crisis - only use your albuterol nebulizer if you first try Plan B           12/16/2016  f/u ov/Phillip [name] re: symbicort 80 plus prn sabas/ no 02 at all  Chief Complaint  Patient presents with  . Follow-up    Increased wheezing in the am x 2 months. He has been using his albuterol inhaler 2 x per day on average. He rarely uses his neb. He had some prod cough with yellow sputum 1 wk ago.    Off singulair since 03/2016 overall worse since but sleeps fine on cpap s 02  - s cpap wakes up gagging and  "wheezing" rec Restart singulair 10 mg each pm   Please see patient coordinator before you leave today  to schedule Internal medicine/ [place]      12/17/2017  f/u ov/Phillip [name] re: asthma  Chief Complaint  Patient presents with  . Follow-up    he has been coughing for the past 2 wks- prod with yellow to brown sputum.  He has also noticed some minimal SOB and wheezing. He has not used his albuterol inhaler or neb.   Dyspnea:  Treadmill x 15 min at 28mph/ slt incline " feels good"  Cough:starting x 2 weeks prior to OV  =  clearing throat a lot, chewing mint gum / mucus min vol but discolored assoc with nasal congestion  Sleeping: cpap no 02  SABA use: rare 02: has concentrator not using  rec Exercise is minimum of 30 min every days where you are short of breath never out of breath  Augmentin 875 mg take one pill twice daily  X 10 days .  Prednisone 10 mg take  4 each am x 2 days,   2 each am x 2 days,  1 each am x 2 days and stop  GERD diet     04/30/2018  f/u ov/Phillip [name] re: cough and sob maint on symb 80/singulair / osa on cpap  Chief Complaint  Patient presents with  . Shortness of Breath    with non productive cough. Was diagnosed in January wiith Bronchitis after ER visit to [facility].  Dyspnea:  Worse since 1st of jan 2020  Cough: mostly dry minimal clear day > noct assoc with nasal congestion  Sleeping: ok 2 pillows/ flat bed  SABA  Better p saba  02: not using at all on cpap 18 with ok download today    No obvious day to day or daytime variability or assoc  purulent sputum or mucus plugs or hemoptysis or cp or chest tightness, subjective wheeze or overt sinus or hb symptoms.   Sleeping  without nocturnal  or early am exacerbation  of respiratory  c/o's or need for noct saba. Also denies any obvious fluctuation of symptoms with weather or environmental changes or other aggravating or alleviating factors except as outlined above   No unusual exposure hx or h/o childhood pna/ asthma  or knowledge of premature birth.  Current Allergies, Complete Past Medical History, Past Surgical History, Family History, and Social History were reviewed in [employer] record.  ROS  The following are not active complaints unless bolded Hoarseness, sore throat, dysphagia, dental problems, itching, sneezing,  nasal congestion or discharge of excess mucus or purulent secretions, ear ache,   fever, chills, sweats, unintended wt loss or wt gain, classically pleuritic or exertional cp,  orthopnea pnd or arm/hand swelling  or leg swelling, presyncope, palpitations, abdominal pain, anorexia, nausea, vomiting, diarrhea  or change in bowel habits or change in bladder habits, change in stools or change in urine, dysuria, hematuria,  rash, arthralgias, visual complaints, headache, numbness, weakness or ataxia or problems with walking or coordination,  change in mood or  memory.        Current Meds  Medication Sig  . albuterol (PROVENTIL) (2.5 MG/3ML) 0.083% nebulizer solution Take 3 mLs (2.5 mg total) by nebulization every 6 (six) hours as needed for wheezing or shortness of breath.  . allopurinol (ZYLOPRIM) 300 MG tablet TAKE 1 TABLET (300 MG TOTAL) DAILY BY MOUTH.  . carvedilol (COREG) 6.25 MG tablet Take 1 tablet (6.25 mg total) by mouth 2 (two) times daily with a meal.  . cloNIDine (CATAPRES) 0.1 MG tablet TAKE 1 TABLET BY MOUTH THREE TIMES A DAY  . colchicine 0.6 MG tablet Take 1 tablet (0.6 mg total) by mouth daily as needed (gout).  . hydrALAZINE (APRESOLINE) 50 MG tablet Take 1.5 tablets (75 mg total) by mouth 3 (three) times daily.  . montelukast (SINGULAIR) 10 MG tablet TAKE 1 TABLET BY MOUTH EVERYDAY AT BEDTIME (Patient taking differently: Take 10 mg by mouth at bedtime. )  . oxymetazoline (AFRIN NASAL SPRAY) 0.05 % nasal spray Place 1 spray into both nostrils 2 (two) times daily as needed for congestion.  . potassium chloride SA (KLOR-CON M20) 20 MEQ tablet TAKE 2 TABLETS  (40 MEQ TOTAL) BY MOUTH DAILY.  . SYMBICORT 80-4.5 MCG/ACT inhaler USE 2 INHALATIONS TWICE A DAY  . torsemide (DEMADEX) 20 MG tablet Take 3 tablets (60 mg total) by mouth 2 (two) times daily.  [name] UNABLE TO FIND Med Name: CPAP with sleep  . XARELTO 20 MG TABS tablet TAKE 1 TABLET DAILY WITH SUPPER (Patient taking differently: Take 20 mg by mouth daily  with supper. )                  Objective:   Physical Exam  Obese bm nad    12/17/2017          379  06/16/2017          374  03/09/2017      376  12/16/2016        372  09/30/2016        358  07/21/2016        362  04/22/2016        374  01/21/2016      390  12/27/2015        387   09/10/2015       372   07/24/15 389 lb 6.4 oz (176.631 kg)  07/08/15 384 lb 3.2 oz (174.272 kg)  06/21/15 390 lb 8 oz (177.13 kg)      Vital signs reviewed - Note on arrival 02 sats  96% on RA        HEENT: nl dentition, turbinates bilaterally, and oropharynx. Nl external ear canals without cough reflex   NECK :  without JVD/Nodes/TM/ nl carotid upstrokes bilaterally   LUNGS: no acc muscle use,  Nl contour chest with distant bs  bilaterally without cough on insp or exp maneuvers   CV:  RRR  no s3 or murmur or increase in P2, and  Only trace bilateral ankle  edema   ABD:  Massively obese with limited  inspiratory excursion in the supine position. No bruits or organomegaly appreciated, bowel sounds nl  MS:  Nl gait/ ext warm without deformities, calf tenderness, cyanosis or clubbing No obvious joint restrictions   SKIN: warm and dry without lesions    NEURO:  alert, approp, nl sensorium with  no motor or cerebellar deficits apparent.          CXR PA and Lateral:   04/30/2018 :    I personally reviewed images and agree with radiology impression as follows:   Cardiomegaly.  No active disease.     Assessment:

## 2018-05-01 ENCOUNTER — Encounter: Payer: Self-pay | Admitting: Internal Medicine

## 2018-05-01 NOTE — Assessment & Plan Note (Signed)
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 Assoc with chf/ cap - PFT's  01/21/2016  FEV1 2.11 (71 % ) ratio 84  p 12 % improvement from saba p breo 100 and on last day of pred taper  prior to study with DLCO  73/82  % corrects to 120 % for alv volume  - 01/21/2016    change to symbicort  160 2bid  - FENO 04/22/2016  =   16 on symb 160 with sub optimal technique and singuair > d/c'd singulair  - 04/22/2016    try symbicort 80 2bid - Allergy profile  09/30/16  >  Eos 0.0 /  IgE 7 RAST neg    - restart singulair 10 mg each am 12/16/2016    - 04/30/2018  After extensive coaching inhaler device,  effectiveness =    75% (short Ti)     DDX of  difficult airways management almost all start with A and  include Adherence, Ace Inhibitors, Acid Reflux, Active Sinus Disease, Alpha 1 Antitripsin deficiency, Anxiety masquerading as Airways dz,  ABPA,  Allergy(esp in young), Aspiration (esp in elderly), Adverse effects of meds,  Active smoking or vaping, A bunch of PE's (a small clot burden can't cause this syndrome unless there is already severe underlying pulm or vascular dz with poor reserve) plus two Bs  = Bronchiectasis and Beta blocker use..and one C= CHF   Adherence is always the initial "prime suspect" and is a multilayered concern that requires a "trust but verify" approach in every patient - starting with knowing how to use medications, especially inhalers, correctly, keeping up with refills and understanding the fundamental difference between maintenance and prns vs those medications only taken for a very short course and then stopped and not refilled.  - see hfa technique - return in 2 weeks with all meds in hand using a trust but verify approach to confirm accurate Medication  Reconciliation The principal here is that until we are certain that the  patients are doing what we've  asked, it makes no sense to ask them to do more.   ? Acid (or non-acid) GERD > always difficult to exclude as up to 75% of pts in some series report no assoc GI/ Heartburn symptoms> rec max (24h)  acid suppression and diet restrictions/ reviewed and instructions given in writing.   ? Allergy/ asthma flare > Prednisone 10 mg take  4 each am x 2 days,   2 each am x 2 days,  1 each am x 2 days and stop   ? Active sinus dz  >  augmentin x 10days  ? A bunch of PE's >  Unlikely on xarelto  ? Beta blocker effects > unlikely on such low doses of coreg   ? CHF/ cardiac asthma def in ddx but no overt chf on cxr > continue bp meds/ demadex

## 2018-05-03 ENCOUNTER — Telehealth: Payer: Self-pay | Admitting: *Deleted

## 2018-05-03 ENCOUNTER — Encounter: Payer: Self-pay | Admitting: Internal Medicine

## 2018-05-03 NOTE — Telephone Encounter (Signed)
Spoke with the pt  He still uses CPAP  He states no one is monitoring this   He uses [facility]  DL printed and placed in Dr Gustavus Bryant lookat

## 2018-05-03 NOTE — Progress Notes (Signed)
Spoke with pt and notified of results per Dr. Wert. Pt verbalized understanding and denied any questions. 

## 2018-05-03 NOTE — Assessment & Plan Note (Signed)
ono RA/cpap   04/09/17 :  Sat < 89 x 4 min so no change Rx (continue cpap)  - download from 05/02/18  97% usage  @ > 4 h/ night and  avg 8h 48 min and  AHI 1.7 with cpap 18 cm  Adequate control on present rx, reviewed in detail with pt > no change in rx needed     I had an extended discussion with the patient reviewing all relevant studies completed to date and  lasting 15 to 20 minutes of a 25 minute visit    See device teaching which extended face to face time for this visit.  Each maintenance medication was reviewed in detail including emphasizing most importantly the difference between maintenance and prns and under what circumstances the prns are to be triggered using an action plan format that is not reflected in the computer generated alphabetically organized AVS which I have not found useful in most complex patients, especially with respiratory illnesses  Please see AVS for specific instructions unique to this visit that I personally wrote and verbalized to the the pt in detail and then reviewed with pt  by my nurse highlighting any  changes in therapy recommended at today's visit to their plan of care.

## 2018-05-03 NOTE — Telephone Encounter (Signed)
-----   Message from Tanda Rockers, MD sent at 05/01/2018  7:07 AM EST ----- Find out if using cpap and if so who is monitoring it. If no one, see if can get download before I close his note

## 2018-05-14 ENCOUNTER — Encounter: Payer: Self-pay | Admitting: Internal Medicine

## 2018-05-14 ENCOUNTER — Ambulatory Visit: Payer: Managed Care, Other (non HMO) | Admitting: Internal Medicine

## 2018-05-14 VITALS — BP 126/78 | HR 56 | Ht 69.0 in | Wt 382.0 lb

## 2018-05-14 DIAGNOSIS — J4530 Mild persistent asthma, uncomplicated: Secondary | ICD-10-CM | POA: Diagnosis not present

## 2018-05-14 MED ORDER — ALBUTEROL SULFATE HFA 108 (90 BASE) MCG/ACT IN AERS: 2.0000 | INHALATION_SPRAY | RESPIRATORY_TRACT | 3 refills | Status: AC | PRN

## 2018-05-14 MED ORDER — BUDESONIDE-FORMOTEROL FUMARATE 80-4.5 MCG/ACT IN AERO: INHALATION_SPRAY | RESPIRATORY_TRACT | 0 refills | Status: DC

## 2018-05-14 NOTE — Progress Notes (Signed)
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 Acute Visit    Pt states every morning he hears a rattling in his chest, still having the sob while walking, still has an occ. cough but mainly non productive, only has chest tightness when he exerts himself, he states he does has some chest conegstion Denies fever, pt unsure if his recent trip to [place] has anything to do with his symtpoms   worse x 3 weeks but better since in ER with dx with chf 09/15/16 and sleeping fine on cpap  Much better p lasix Some better after saba but mostly using neb and skipping hfa saba in action paln  Minimal rattling better p extra dose of diuretic rec Plan A = Automatic = Symbicort 80 Take 2 puffs first thing in am and then another 2 puffs about 12 hours later.  Plan B = Backup Only use your albuterol as a rescue medication to be used if you can't catch your breath Plan C = Crisis - only use your albuterol nebulizer if you first try Plan B           12/16/2016  f/u ov/Phillip [name] re: symbicort 80 plus prn sabas/ no 02 at all  Chief Complaint  Patient presents with  . Follow-up    Increased wheezing in the am x 2 months. He has been using his albuterol inhaler 2 x per day on average. He rarely uses his neb. He had some prod cough with yellow sputum 1 wk ago.    Off singulair since 03/2016 overall worse since but sleeps fine on cpap s 02  - s cpap wakes up gagging and  "wheezing" rec Restart singulair 10 mg each pm   Please see patient coordinator before you leave today  to schedule Internal medicine/ [place]      12/17/2017  f/u ov/Phillip [name] re: asthma  Chief Complaint  Patient presents with  . Follow-up    he has been coughing for the past 2 wks- prod with yellow to brown sputum.  He has also noticed some minimal SOB and wheezing. He has not used his albuterol inhaler or neb.   Dyspnea:  Treadmill x 15 min at 29mph/ slt incline " feels good"  Cough:starting x 2 weeks prior to OV  =  clearing throat a lot, chewing mint gum / mucus min vol but discolored assoc with nasal congestion  Sleeping: cpap no 02  SABA use: rare 02: has concentrator not using  rec Exercise is minimum of 30 min every days where you are short of breath never out of breath  Augmentin 875 mg take one pill twice daily  X 10 days .  Prednisone 10 mg take  4 each am x 2 days,   2 each am x 2 days,  1 each am x 2 days and stop  GERD diet     04/30/2018  f/u ov/Phillip [name] re: cough and sob maint on symb 80/singulair / osa on cpap  Chief Complaint  Patient presents with  . Shortness of Breath    with non productive cough. Was diagnosed in January wiith Bronchitis after ER visit to [facility].  Dyspnea:  Worse since 1st of jan 2020  Cough: mostly dry minimal clear day > noct assoc with nasal congestion  Sleeping: ok 2 pillows/ flat bed  SABA  Better p saba  02: not using at all on cpap 18 with ok download today  rec Augmentin 875 mg take one pill twice daily  X 10 days - take at breakfast and supper with large glass of water.  It would help reduce the usual side effects (diarrhea and yeast infections) if you ate cultured yogurt at lunch.  Prednisone 10 mg take  4 each am x 2 days,   2 each am x 2 days,  1 each am x 2 days and stop  For cough > delsym tsp every 12 hours as needed Pantoprazole (protonix) 40 mg   Take  30-60 min before first meal of the day and Pepcid (famotidine)  20 mg one after   supper  until return to office     Please schedule a follow up office visit in 2 weeks, sooner if needed  with all medications /inhalers/ solutions in hand so we can verify exactly what you are taking. This includes all medications from all doctors and over the counters   05/14/2018  f/u ov/Phillip [name] re: mild chronic asthma  maint symb 80 2bid ? Adherence ?  Did not bring meds as req  Chief Complaint  Patient presents with  . Follow-up    Cough has improved some- occ prod with clear sputum. He does not currently have a rescue inhaler and had not needed neb.    Dyspnea:  MMRC3 = can't walk 100 yards even at a slow pace at a flat grade s stopping due to sob   Cough: min mucoid/ sporadic though some worse in am Sleeping: on side on cpap  SABA use: never  02: has not using    No obvious day to day or daytime variability or assoc   purulent sputum or mucus plugs or hemoptysis or cp or chest tightness, subjective wheeze or overt sinus or hb symptoms.   Sleeping as above  without nocturnal  or early am exacerbation  of respiratory  c/o's or need for noct saba. Also denies any obvious fluctuation of symptoms with weather or environmental changes or other aggravating or alleviating factors except as outlined above   No unusual exposure hx or h/o childhood pna/ asthma or knowledge of premature birth.  Current Allergies, Complete Past Medical History, Past Surgical History, Family History, and Social History were reviewed in [employer] record.  ROS  The following are not active complaints unless bolded Hoarseness, sore throat, dysphagia, dental problems, itching, sneezing,  nasal congestion or discharge of excess mucus or purulent secretions, ear ache,   fever, chills, sweats, unintended wt loss or wt gain, classically pleuritic or exertional cp,  orthopnea pnd or arm/hand swelling  or leg swelling, presyncope, palpitations, abdominal pain, anorexia, nausea, vomiting, diarrhea  or  change in bowel habits or change in bladder habits, change in stools or change in urine, dysuria, hematuria,  rash, arthralgias, visual complaints, headache, numbness, weakness or ataxia or problems with  walking or coordination,  change in mood or  memory.        Current Meds  Medication Sig  . albuterol (PROVENTIL) (2.5 MG/3ML) 0.083% nebulizer solution Take 3 mLs (2.5 mg total) by nebulization every 6 (six) hours as needed for wheezing or shortness of breath.  . allopurinol (ZYLOPRIM) 300 MG tablet TAKE 1 TABLET (300 MG TOTAL) DAILY BY MOUTH.  . carvedilol (COREG) 6.25 MG tablet Take 1 tablet (6.25 mg total) by mouth 2 (two) times daily with a meal.  . cloNIDine (CATAPRES) 0.1 MG tablet TAKE 1 TABLET BY MOUTH THREE TIMES A DAY  . colchicine 0.6 MG tablet Take 1 tablet (0.6 mg total) by mouth daily as needed (gout).  . famotidine (PEPCID) 20 MG tablet One at bedtime  . hydrALAZINE (APRESOLINE) 50 MG tablet Take 1.5 tablets (75 mg total) by mouth 3 (three) times daily.  . montelukast (SINGULAIR) 10 MG tablet TAKE 1 TABLET BY MOUTH EVERYDAY AT BEDTIME (Patient taking differently: Take 10 mg by mouth at bedtime. )  . oxymetazoline (AFRIN NASAL SPRAY) 0.05 % nasal spray Place 1 spray into both nostrils 2 (two) times daily as needed for congestion.  . pantoprazole (PROTONIX) 40 MG tablet Take 1 tablet (40 mg total) by mouth daily. Take 30-60 min before first meal of the day  . potassium chloride SA (KLOR-CON M20) 20 MEQ tablet TAKE 2 TABLETS (40 MEQ TOTAL) BY MOUTH DAILY.  . SYMBICORT 80-4.5 MCG/ACT inhaler USE 2 INHALATIONS TWICE A DAY  . torsemide (DEMADEX) 20 MG tablet Take 3 tablets (60 mg total) by mouth 2 (two) times daily.  [name] UNABLE TO FIND Med Name: CPAP with sleep  . XARELTO 20 MG TABS tablet TAKE 1 TABLET DAILY WITH SUPPER (Patient taking differently: Take 20 mg by mouth daily with supper. )                       Objective:   Physical Exam  Obese bm nad    05/14/2018          382  12/17/2017          379  06/16/2017          374  03/09/2017      376  12/16/2016        372  09/30/2016        358  07/21/2016        362  04/22/2016        374  01/21/2016      390  12/27/2015        387   09/10/2015       372   07/24/15 389 lb 6.4 oz (176.631 kg)  07/08/15 384 lb 3.2 oz (174.272 kg)  06/21/15 390 lb 8 oz (177.13 kg)       Vital signs reviewed - Note on arrival 02 sats  98% on RA       HEENT: nl dentition, turbinates bilaterally, and oropharynx. Nl external ear canals without cough reflex   NECK :  without JVD/Nodes/TM/ nl carotid upstrokes bilaterally   LUNGS: no acc muscle use,  Nl contour chest with distant bs   bilaterally without cough on insp or exp maneuvers   CV:  RRR  no s3 or murmur or increase in P2, and trace ankle pitting edema sym bilaterally   ABD:  Massively obese/ soft and nontender with limited inspiratory excursion in the supine position. No bruits or organomegaly appreciated, bowel  sounds nl  MS:  Nl gait/ ext warm without deformities, calf tenderness, cyanosis or clubbing No obvious joint restrictions   SKIN: warm and dry without lesions    NEURO:  alert, approp, nl sensorium with  no motor or cerebellar deficits apparent.                Assessment:

## 2018-05-14 NOTE — Patient Instructions (Addendum)
No change in meds  Work perfecting inhaler technique:  relax and gently blow all the way out then take a nice smooth deep breath back in, triggering the inhaler at same time you start breathing in.  Hold for up to 5 seconds if you can. Blow out thru nose. Rinse and gargle with water when done   Please schedule a follow up visit in 6  months but call sooner if needed  with all medications /inhalers/ solutions in hand so we can verify exactly what you are taking. This includes all medications from all doctors and over the counters

## 2018-05-16 ENCOUNTER — Encounter: Payer: Self-pay | Admitting: Internal Medicine

## 2018-05-16 NOTE — Assessment & Plan Note (Signed)
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 Assoc with chf/ cap - PFT's  01/21/2016  FEV1 2.11 (71 % ) ratio 84  p 12 % improvement from saba p breo 100 and on last day of pred taper  prior to study with DLCO  73/82  % corrects to 120 % for alv volume  - 01/21/2016    change to symbicort  160 2bid  - FENO 04/22/2016  =   16 on symb 160 with sub optimal technique and singuair > d/c'd singulair  - 04/22/2016    try symbicort 80 2bid - Allergy profile  09/30/16  >  Eos 0.0 /  IgE 7 RAST neg    - restart singulair 10 mg each am 12/16/2016     - 05/14/2018  After extensive coaching inhaler device,  effectiveness =    75% (still too short Ti)   Despite poor hfa baseline, All goals of chronic asthma control met including optimal function and elimination of symptoms with minimal need for rescue therapy.  Contingencies discussed in full including contacting this office immediately if not controlling the symptoms using the rule of two's.

## 2018-05-16 NOTE — Assessment & Plan Note (Signed)
ERV  01/21/2016  = 24% c/w body habitus   Body mass index is 56.41 kg/m.  -  trending up Lab Results  Component Value Date   TSH 1.77 03/23/2017     Contributing to gerd risk/ doe/reviewed the need and the process to achieve and maintain neg calorie balance > defer f/u primary care including intermittently monitoring thyroid status      I had an extended discussion with the patient reviewing all relevant studies completed to date and  lasting 15 to 20 minutes of a 25 minute visit    See device teaching which extended face to face time for this visit.  Each maintenance medication was reviewed in detail including emphasizing most importantly the difference between maintenance and prns and under what circumstances the prns are to be triggered using an action plan format that is not reflected in the computer generated alphabetically organized AVS which I have not found useful in most complex patients, especially with respiratory illnesses  Please see AVS for specific instructions unique to this visit that I personally wrote and verbalized to the the pt in detail and then reviewed with pt  by my nurse highlighting any  changes in therapy recommended at today's visit to their plan of care.

## 2018-07-12 ENCOUNTER — Other Ambulatory Visit: Payer: Self-pay | Admitting: Internal Medicine

## 2018-07-28 ENCOUNTER — Other Ambulatory Visit: Payer: Self-pay | Admitting: Internal Medicine

## 2018-07-28 DIAGNOSIS — J4530 Mild persistent asthma, uncomplicated: Secondary | ICD-10-CM

## 2018-07-28 MED ORDER — PANTOPRAZOLE SODIUM 40 MG PO TBEC: 40.0000 mg | DELAYED_RELEASE_TABLET | Freq: Every day | ORAL | 5 refills | Status: DC

## 2018-07-30 ENCOUNTER — Other Ambulatory Visit: Payer: Self-pay | Admitting: Cardiology

## 2018-08-03 ENCOUNTER — Other Ambulatory Visit: Payer: Self-pay | Admitting: Internal Medicine

## 2018-08-07 ENCOUNTER — Other Ambulatory Visit: Payer: Self-pay | Admitting: Internal Medicine

## 2018-08-10 ENCOUNTER — Encounter (HOSPITAL_COMMUNITY): Payer: Managed Care, Other (non HMO) | Admitting: Internal Medicine

## 2018-08-10 ENCOUNTER — Other Ambulatory Visit (HOSPITAL_COMMUNITY): Payer: Managed Care, Other (non HMO)

## 2018-08-17 ENCOUNTER — Other Ambulatory Visit (HOSPITAL_COMMUNITY): Payer: Self-pay | Admitting: Student

## 2018-09-08 ENCOUNTER — Telehealth: Payer: Self-pay

## 2018-09-08 NOTE — Telephone Encounter (Signed)
Returned call to patient. He is wanting covid testing due to sob. Offered earlier appt pt declined. Pt uses his nebulizer and does get relief. Pt advised to google testing sites available in [employer]. Pt advised to call back if he would like sooner appt. Nothing further needed.

## 2018-09-29 ENCOUNTER — Other Ambulatory Visit (HOSPITAL_COMMUNITY): Payer: Self-pay | Admitting: Cardiology

## 2018-09-29 DIAGNOSIS — E876 Hypokalemia: Secondary | ICD-10-CM

## 2018-10-01 ENCOUNTER — Other Ambulatory Visit: Payer: Self-pay

## 2018-10-01 DIAGNOSIS — R600 Localized edema: Secondary | ICD-10-CM | POA: Insufficient documentation

## 2018-10-01 DIAGNOSIS — Z87891 Personal history of nicotine dependence: Secondary | ICD-10-CM | POA: Insufficient documentation

## 2018-10-01 DIAGNOSIS — J4530 Mild persistent asthma, uncomplicated: Secondary | ICD-10-CM | POA: Diagnosis not present

## 2018-10-01 DIAGNOSIS — I5032 Chronic diastolic (congestive) heart failure: Secondary | ICD-10-CM | POA: Diagnosis not present

## 2018-10-01 DIAGNOSIS — Z79899 Other long term (current) drug therapy: Secondary | ICD-10-CM | POA: Diagnosis not present

## 2018-10-01 DIAGNOSIS — I110 Hypertensive heart disease with heart failure: Secondary | ICD-10-CM | POA: Insufficient documentation

## 2018-10-01 DIAGNOSIS — R0602 Shortness of breath: Secondary | ICD-10-CM | POA: Diagnosis present

## 2018-10-01 DIAGNOSIS — J449 Chronic obstructive pulmonary disease, unspecified: Secondary | ICD-10-CM | POA: Diagnosis not present

## 2018-10-01 DIAGNOSIS — Z7901 Long term (current) use of anticoagulants: Secondary | ICD-10-CM | POA: Diagnosis not present

## 2018-10-01 NOTE — ED Triage Notes (Signed)
Pt arrived stating he has been having trouble breathing since Tuesday. Pt takes he takes a fluid pill and feels like it is not helping. Pt states he has a dry cough as well. Pt has dyspnea with exertion, O2 93. At rest pt 99%

## 2018-10-02 ENCOUNTER — Emergency Department (HOSPITAL_COMMUNITY): Payer: Managed Care, Other (non HMO)

## 2018-10-02 ENCOUNTER — Emergency Department (HOSPITAL_COMMUNITY)
Admission: EM | Admit: 2018-10-02 | Discharge: 2018-10-02 | Disposition: A | Discharge status: Home / Self Care | Payer: Managed Care, Other (non HMO) | Attending: Emergency Medicine | Admitting: Emergency Medicine

## 2018-10-02 DIAGNOSIS — I509 Heart failure, unspecified: Secondary | ICD-10-CM

## 2018-10-02 LAB — COMPREHENSIVE METABOLIC PANEL
ALT: 22 U/L (ref 0–44)
AST: 26 U/L (ref 15–41)
Albumin: 4.3 g/dL (ref 3.5–5.0)
Alkaline Phosphatase: 89 U/L (ref 38–126)
Anion gap: 11 (ref 5–15)
BUN: 28 mg/dL — ABNORMAL HIGH (ref 6–20)
CO2: 29 mmol/L (ref 22–32)
Calcium: 9.4 mg/dL (ref 8.9–10.3)
Chloride: 104 mmol/L (ref 98–111)
Creatinine, Ser: 2.07 mg/dL — ABNORMAL HIGH (ref 0.61–1.24)
GFR calc Af Amer: 40 mL/min — ABNORMAL LOW (ref >60)
GFR calc non Af Amer: 35 mL/min — ABNORMAL LOW (ref >60)
Glucose, Bld: 102 mg/dL — ABNORMAL HIGH (ref 70–99)
Potassium: 3.4 mmol/L — ABNORMAL LOW (ref 3.5–5.1)
Sodium: 144 mmol/L (ref 135–145)
Total Bilirubin: 0.8 mg/dL (ref 0.3–1.2)
Total Protein: 7.3 g/dL (ref 6.5–8.1)

## 2018-10-02 LAB — CBC WITH DIFFERENTIAL/PLATELET
Abs Immature Granulocytes: 0.01 10*3/uL (ref 0.00–0.07)
Basophils Absolute: 0 10*3/uL (ref 0.0–0.1)
Basophils Relative: 1 %
Eosinophils Absolute: 0 10*3/uL (ref 0.0–0.5)
Eosinophils Relative: 1 %
HCT: 44.4 % (ref 39.0–52.0)
Hemoglobin: 13.3 g/dL (ref 13.0–17.0)
Immature Granulocytes: 0 %
Lymphocytes Relative: 34 %
Lymphs Abs: 1.6 10*3/uL (ref 0.7–4.0)
MCH: 27.8 pg (ref 26.0–34.0)
MCHC: 30 g/dL (ref 30.0–36.0)
MCV: 92.7 fL (ref 80.0–100.0)
Monocytes Absolute: 0.5 10*3/uL (ref 0.1–1.0)
Monocytes Relative: 10 %
Neutro Abs: 2.6 10*3/uL (ref 1.7–7.7)
Neutrophils Relative %: 54 %
Platelets: 182 10*3/uL (ref 150–400)
RBC: 4.79 MIL/uL (ref 4.22–5.81)
RDW: 15.9 % — ABNORMAL HIGH (ref 11.5–15.5)
WBC: 4.7 10*3/uL (ref 4.0–10.5)
nRBC: 0 % (ref 0.0–0.2)

## 2018-10-02 LAB — BRAIN NATRIURETIC PEPTIDE: B Natriuretic Peptide: 927.7 pg/mL — ABNORMAL HIGH (ref 0.0–100.0)

## 2018-10-02 MED ORDER — FUROSEMIDE 10 MG/ML IJ SOLN
80.0000 mg | Freq: Once | INTRAMUSCULAR | Status: AC
  Administered 2018-10-02: 80 mg via INTRAVENOUS
  Filled 2018-10-02: qty 8

## 2018-10-02 MED ORDER — ALBUTEROL SULFATE HFA 108 (90 BASE) MCG/ACT IN AERS
2.0000 | INHALATION_SPRAY | Freq: Once | RESPIRATORY_TRACT | Status: AC
  Administered 2018-10-02: 2 via RESPIRATORY_TRACT
  Filled 2018-10-02: qty 6.7

## 2018-10-02 NOTE — Discharge Instructions (Addendum)
Continue taking your home medications as prescribed. Follow-up with your cardiologist for further evaluation and management of your heart failure. Return to the emergency room if you develop fevers, chest pain, worsening difficulty breathing, any new or concerning symptoms.

## 2018-10-02 NOTE — ED Provider Notes (Signed)
I assumed care of patient from previous team at shift change, please see their note for full H&P.  Briefly patient is here for a suspected acute on chronic CHF.  He has been given a dose of Lasix with significant urinary output.  Labs have already been obtained and reviewed by previous team.  Plan at this time is awaiting ambulation with pulse ox.  If he is able to do that without desaturating will discharge home.  Patient was able to ambulate from there and down the hall without difficulty maintaining oxygen of 95 to 97%.  He tells me that he feels better and wishes to go home at this time.  He is in no physical distress on exam.  Return precautions were discussed with patient who states their understanding.  At the time of discharge patient denied any unaddressed complaints or concerns.  Patient is agreeable for discharge home.    Lorin Glass, PA-C 10/02/18 0912    Carmin Muskrat, MD 10/03/18 [phone]

## 2018-10-02 NOTE — ED Notes (Signed)
Pt ambulate from the room down the hall done well 95%-97% on return

## 2018-10-02 NOTE — ED Provider Notes (Signed)
[facility] DEPT Provider Note   CSN: [id number] Arrival date & time: 10/01/18  2301     History   Chief Complaint Chief Complaint  Patient presents with  . Shortness of Breath    HPI Phillip [name] is a 58 y.o. male presenting for evaluation of shortness of breath.  Patient states for the past 4 days, he has had gradually worsening dyspnea on exertion.  Patient states symptoms are particularly bad when he walks upstairs.  He also reports worsening swelling of bilateral legs and abdominal tightness.  He takes furosemide, 3 pills twice daily.  He has increased to 4 pills twice daily for the past 2 days, reports mild improvement in his abdominal tightening and leg swelling.  He denies recent fevers, chills, cough, chest pain, nausea, vomiting.  He denies sick contact or known contact with COVID-19 positive person.  He denies abnormal urination or abnormal bowel movements.   Additional history obtained from chart review.  Patient with a history of CHF, COPD, DVT, hypertension, CKD, paroxysmal A. fib.  He is on Xarelto, which he has been taking as prescribed.     HPI  Past Medical History:  Diagnosis Date  . CHF (congestive heart failure) ([place]) 11/15   EF 40-45%, improved with follow-up  . COPD (chronic obstructive pulmonary disease) ([place])   . Gout   . History of DVT (deep vein thrombosis)   . Hypertension   . Hypertensive cardiovascular disease   . Kidney dysfunction   . Morbid obesity ([place])   . OSA on CPAP   . Paroxysmal atrial fibrillation ([facility])   . PNA (pneumonia)   . Pulmonary embolism ([place]) 2010   left leg DVT    Patient Active Problem List   Diagnosis Date Noted  . 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 Acute bronchitis with COPD ([place]) 04/09/2018  . Preventative health care 02/05/2018  . Atrial flutter ([place]) 06/22/2017  . Anemia 03/23/2017  . Acute rhinitis 03/10/2017  . Asthma with COPD ([place])   . Chronic diastolic heart failure ([place])   . Long  term (current) use of anticoagulants [Z79.01] 08/17/2015  . Hx pulmonary embolism 08/17/2015  . Deep vein thrombosis (DVT) of lower extremity ([place]) 08/17/2015  . Paroxysmal atrial fibrillation ([place]) 08/04/2015  . SVT (supraventricular tachycardia) ([place]) 06/22/2015  . Dyspnea on exertion 06/20/2015  . Morbid obesity due to excess calories ([place]) 03/07/2015  . Elevated troponin 02/20/2015  . Essential hypertension   . Gout   . Mild persistent asthma in adult without complication 62/05/5595  . OSA on CPAP 05/06/2014  . Chronic kidney disease, stage IV (severe) ([place]) 05/06/2014  . Asthma with acute exacerbation 05/06/2014    Past Surgical History:  Procedure Laterality Date  . A-FLUTTER ABLATION N/A 07/22/2017   Procedure: A-FLUTTER ABLATION;  Surgeon: Thompson Grayer, MD;  Location: [facility];  Service: Cardiovascular;  Laterality: N/A;  . [place] Medications    Prior to Admission medications   Medication Sig Start Date End Date Taking? Authorizing Provider  allopurinol (ZYLOPRIM) 300 MG tablet TAKE 1 TABLET (300 MG TOTAL) DAILY BY MOUTH. 01/07/18  Yes [name] I, MD  budesonide-formoterol (SYMBICORT) 80-4.5 MCG/ACT inhaler USE 2 INHALATIONS TWICE A DAY (NEED APPOINTMENT) Patient taking differently: Inhale 2 puffs into the lungs 2 (two) times a day.  07/12/18  Yes Tanda Rockers, MD  carvedilol (COREG) 6.25 MG tablet TAKE 1 TABLET TWICE A DAY  WITH MEALS Patient taking differently: Take 6.25 mg by mouth 2 (two) times daily with a meal.  07/30/18  Yes Bensimhon, Shaune Pascal, MD  cloNIDine (CATAPRES) 0.1 MG tablet TAKE 1 TABLET BY MOUTH THREE TIMES A DAY Patient taking differently: Take 0.1 mg by mouth 3 (three) times daily.  08/03/18  Yes [name], MD  famotidine (PEPCID) 20 MG tablet One at bedtime Patient taking differently: Take 20 mg by mouth at bedtime.  04/30/18  Yes Tanda Rockers, MD  hydrALAZINE (APRESOLINE) 50 MG tablet TAKE  ONE AND ONE-HALF TABLETS THREE TIMES A DAY Patient taking differently: Take 75 mg by mouth 3 (three) times daily.  08/17/18  Yes Bensimhon, Shaune Pascal, MD  montelukast (SINGULAIR) 10 MG tablet TAKE 1 TABLET BY MOUTH EVERYDAY AT BEDTIME Patient taking differently: Take 10 mg by mouth at bedtime.  08/09/18  Yes Tanda Rockers, MD  naproxen sodium (ALEVE) 220 MG tablet Take 440 mg by mouth 2 (two) times daily as needed (pain).   Yes [provider]  pantoprazole (PROTONIX) 40 MG tablet Take 1 tablet (40 mg total) by mouth daily. Take 30-60 min before first meal of the day 07/28/18  Yes Tanda Rockers, MD  potassium chloride SA (KLOR-CON M20) 20 MEQ tablet TAKE 2 TABLETS DAILY Patient taking differently: Take 40 mEq by mouth daily.  09/29/18  Yes Larey Dresser, MD  torsemide (DEMADEX) 20 MG tablet Take 3 tablets (60 mg total) by mouth 2 (two) times daily. 01/27/18 01/22/19 Yes Larey Dresser, MD  XARELTO 20 MG TABS tablet TAKE 1 TABLET DAILY WITH SUPPER Patient taking differently: Take 20 mg by mouth daily with supper.  04/02/18  Yes Allred, Jeneen Rinks, MD  albuterol (PROAIR HFA) 108 (90 Base) MCG/ACT inhaler Inhale 2 puffs into the lungs every 4 (four) hours as needed for wheezing or shortness of breath. 05/14/18   Tanda Rockers, MD  albuterol (PROVENTIL) (2.5 MG/3ML) 0.083% nebulizer solution Take 3 mLs (2.5 mg total) by nebulization every 6 (six) hours as needed for wheezing or shortness of breath. 02/05/18   [name], MD  colchicine 0.6 MG tablet Take 1 tablet (0.6 mg total) by mouth daily as needed (gout). Patient not taking: Reported on 10/02/2018 10/22/17   [name], MD  oxymetazoline (AFRIN NASAL SPRAY) 0.05 % nasal spray Place 1 spray into both nostrils 2 (two) times daily as needed for congestion. Patient not taking: Reported on 10/02/2018 03/12/17   Oswald Hillock, MD  UNABLE TO FIND Med Name: CPAP with sleep    [provider]    Family History Family History   Problem Relation Age of Onset  . Hypertension Mother   . Diabetes Father   . CAD Father   . Stomach cancer Father   . Clotting disorder Father   . Heart disease Father        transplant  . Allergies Brother     Social History Social History   Tobacco Use  . Smoking status: Former Smoker    Packs/day: 0.25    Years: 1.00    Pack years: 0.25    Types: Cigarettes    Quit date: 07/23/1989    Years since quitting: 29.2  . Smokeless tobacco: Never Used  Substance Use Topics  . Alcohol use: No    Alcohol/week: 0.0 standard drinks  . Drug use: No     Allergies   Other   Review of Systems Review of Systems  Respiratory: Positive for shortness of breath.   Cardiovascular: Positive for leg swelling.  Hematological: Bruises/bleeds easily.  All other systems reviewed and are negative.    Physical Exam Updated Vital Signs BP (!) 173/63   Pulse (!) 43   Temp 98.9 F (37.2 C) (Oral)   Resp 14   SpO2 94%   Physical Exam Vitals signs and nursing note reviewed.  Constitutional:      General: He is not in acute distress.    Appearance: He is well-developed.     Comments: Obese male who appears nontoxic.  HENT:     Head: Normocephalic and atraumatic.  Eyes:     Extraocular Movements: Extraocular movements intact.     Conjunctiva/sclera: Conjunctivae normal.     Pupils: Pupils are equal, round, and reactive to light.  Neck:     Musculoskeletal: Normal range of motion and neck supple.  Cardiovascular:     Rate and Rhythm: Normal rate and regular rhythm.     Pulses: Normal pulses.  Pulmonary:     Effort: Pulmonary effort is normal. No respiratory distress.     Breath sounds: Normal breath sounds. No wheezing.     Comments: Speaking in full sentences, clear lung sounds in all fields.  Abdominal:     General: There is no distension.     Palpations: Abdomen is soft. There is no mass.     Tenderness: There is no abdominal tenderness. There is no guarding or rebound.   Musculoskeletal: Normal range of motion.     Right lower leg: Edema present.     Left lower leg: Edema present.     Comments: Bilateral 1-2+ pitting edema  Skin:    General: Skin is warm and dry.     Capillary Refill: Capillary refill takes less than 2 seconds.  Neurological:     Mental Status: He is alert and oriented to person, place, and time.      ED Treatments / Results  Labs (all labs ordered are listed, but only abnormal results are displayed) Labs Reviewed  CBC WITH DIFFERENTIAL/PLATELET - Abnormal; Notable for the following components:      Result Value   RDW 15.9 (*)    All other components within normal limits  COMPREHENSIVE METABOLIC PANEL - Abnormal; Notable for the following components:   Potassium 3.4 (*)    Glucose, Bld 102 (*)    BUN 28 (*)    Creatinine, Ser 2.07 (*)    GFR calc non Af Amer 35 (*)    GFR calc Af Amer 40 (*)    All other components within normal limits  BRAIN NATRIURETIC PEPTIDE - Abnormal; Notable for the following components:   B Natriuretic Peptide 927.7 (*)    All other components within normal limits    EKG EKG Interpretation  Date/Time:  Saturday October 02 2018 03:55:17 EDT Ventricular Rate:  81 PR Interval:    QRS Duration: 102 QT Interval:  438 QTC Calculation: 509 R Axis:   -43 Text Interpretation:  Atrial fibrillation Left anterior fascicular block Anterior infarct, old Abnormal T, consider ischemia, lateral leads Prolonged QT interval No acute changes No significant change since last tracing Confirmed by Varney Biles [phone]) on 10/02/2018 4:39:02 AM   Radiology Dg Chest 2 View  Result Date: 10/02/2018 CLINICAL DATA:  Initial evaluation for acute shortness of breath. EXAM: CHEST - 2 VIEW COMPARISON:  Prior radiograph from 04/30/2018. FINDINGS: Cardiomegaly, stable. Mediastinal silhouette within normal limits. Lungs mildly hypoinflated. Central perihilar vascular  congestion without overt pulmonary edema. No visible pleural  effusion. No consolidative airspace disease. No pneumothorax. No acute osseous finding. IMPRESSION: 1. Cardiomegaly with mild perihilar vascular congestion without overt pulmonary edema. 2. No other active cardiopulmonary disease. Electronically Signed   By: Jeannine Boga M.D.   On: 10/02/2018 05:13    Procedures Procedures (including critical care time)  Medications Ordered in ED Medications  furosemide (LASIX) injection 80 mg (80 mg Intravenous Given 10/02/18 0438)  albuterol (VENTOLIN HFA) 108 (90 Base) MCG/ACT inhaler 2 puff (2 puffs Inhalation Given 10/02/18 2683)     Initial Impression / Assessment and Plan / ED Course  I have reviewed the triage vital signs and the nursing notes.  Pertinent labs & imaging results that were available during my care of the patient were reviewed by me and considered in my medical decision making (see chart for details).        Patient presenting for evaluation of worsening shortness of breath with exertion.  Physical exam reassuring, he appears nontoxic.  Sats stable.  No respiratory distress.  Mild peripheral edema.  In the setting of worsening edema and tightness with a history of CHF, likely CHF exacerbation.  Low suspicion for PE, as patient is on blood thinner and been taking it as prescribed.  Low suspicion for infection, has no fever or cough.  Will order labs, x-ray, EKG, and reassess.  Lasix for symptom control.  EKG shows slightly prolonged QT from baseline, but otherwise reassuring.  X-ray viewed interpreted by me, no pneumonia pneumothorax, effusion.  Patient with cardiomegaly and mild vascular congestion.  Labs pending.  Labs for the most part at baseline.  Creatinine stable when compared to previous.  BNP mildly elevated at 900 today, 600 last visit.  On reassessment, patient reports symptoms are improved with Lasix and albuterol.  As such, will ambulate patient on pulse ox.  If sats remained stable, I believe he is safe for discharge  with close cards f/u despite slightly elevated BNP.  If sats drop, he will likely need to be admitted. Discussed plan with pt, who is agreeable.   Patient signed out to E. Phylliss Bob, PA-C for follow-up after ambulation.   Final Clinical Impressions(s) / ED Diagnoses   Final diagnoses:  None    ED Discharge Orders    None       Franchot Heidelberg, PA-C 10/02/18 [place], Ankit, MD 10/03/18 [phone]

## 2018-10-07 ENCOUNTER — Ambulatory Visit: Payer: Managed Care, Other (non HMO) | Admitting: Internal Medicine

## 2018-10-07 ENCOUNTER — Encounter: Payer: Self-pay | Admitting: Internal Medicine

## 2018-10-07 ENCOUNTER — Other Ambulatory Visit: Payer: Self-pay

## 2018-10-07 DIAGNOSIS — R0609 Other forms of dyspnea: Secondary | ICD-10-CM

## 2018-10-07 DIAGNOSIS — J4530 Mild persistent asthma, uncomplicated: Secondary | ICD-10-CM | POA: Diagnosis not present

## 2018-10-07 LAB — TROPONIN I: TNIDX: 0.05 ug/l (ref 0.00–0.06)

## 2018-10-07 LAB — TSH: TSH: 2.74 u[IU]/mL (ref 0.35–4.50)

## 2018-10-07 MED ORDER — PANTOPRAZOLE SODIUM 40 MG PO TBEC: DELAYED_RELEASE_TABLET | ORAL | 5 refills | Status: DC

## 2018-10-07 NOTE — Patient Instructions (Addendum)
Restart protonix 40 mg Take 30- 60 min before your first and last meals of the day (called in new rx)_  Please remember to go to the lab department   for your tests - we will call you with the results when they are available.     If chest tightness is worsening go to ER otherwise take it easy until seen by Cardiology 10/11/18 as planned

## 2018-10-07 NOTE — Assessment & Plan Note (Addendum)
Body mass index is 55.23 kg/m.  -  trending down sltly  Lab Results  Component Value Date   TSH 2.74 10/07/2018     Contributing to gerd risk/ doe/reviewed the need and the process to achieve and maintain neg calorie balance > defer f/u primary care including intermittently monitoring thyroid status

## 2018-10-07 NOTE — Assessment & Plan Note (Addendum)
10/07/2018   Walked RA x one lap =  approx 250 ft - stopped due to chest tight > sob with sats 96% with ekg c/w controlled afib s ischemic changes    Progressive x one month with assoc elevated bnp and  chest discomfort suggestive of angina/ ischemic cm  but can't tol ntg and already on BB so rec check troponin and has been having some resting tightness x last 24 h and go to er if recurs on ppi bid or await cards eval 10/11/18 if only occurring with exertion   I had an extended discussion with the patient   reviewing all relevant studies completed to date and  lasting 15 to 20 minutes of a 25 minute acute office  visit  which included directly observing ambulatory 02 saturation study documented in a/p section of  today's  office note.  Each maintenance medication was reviewed in detail including most importantly the difference between maintenance and prns and under what circumstances the prns are to be triggered using an action plan format that is not reflected in the computer generated alphabetically organized AVS.     Please see AVS for specific instructions unique to this visit that I personally wrote and verbalized to the the pt in detail and then reviewed with pt  by my nurse highlighting any changes in therapy recommended at today's visit .

## 2018-10-07 NOTE — Progress Notes (Signed)
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 Acute Visit    Pt states every morning he hears a rattling in his chest, still having the sob while walking, still has an occ. cough but mainly non productive, only has chest tightness when he exerts himself, he states he does has some chest conegstion Denies fever, pt unsure if his recent trip to [place] has anything to do with his symtpoms   worse x 3 weeks but better since in ER with dx with chf 09/15/16 and sleeping fine on cpap  Much better p lasix Some better after saba but mostly using neb and skipping hfa saba in action paln  Minimal rattling better p extra dose of diuretic rec Plan A = Automatic = Symbicort 80 Take 2 puffs first thing in am and then another 2 puffs about 12 hours later.  Plan B = Backup Only use your albuterol as a rescue medication to be used if you can't catch your breath Plan C = Crisis - only use your albuterol nebulizer if you first try Plan B           12/16/2016  f/u ov/Phillip [name] re: symbicort 80 plus prn sabas/ no 02 at all  Chief Complaint  Patient presents with  . Follow-up    Increased wheezing in the am x 2 months. He has been using his albuterol inhaler 2 x per day on average. He rarely uses his neb. He had some prod cough with yellow sputum 1 wk ago.    Off singulair since 03/2016 overall worse since but sleeps fine on cpap s 02  - s cpap wakes up gagging and  "wheezing" rec Restart singulair 10 mg each pm   Please see patient coordinator before you leave today  to schedule Internal medicine/ [place]       05/14/2018  f/u ov/Phillip [name] re: mild chronic asthma  maint symb 80 2bid ? Adherence ?  Did not bring meds as req  Chief Complaint  Patient presents with  . Follow-up    Cough has improved some- occ prod with clear sputum. He does not currently have a rescue inhaler and had not needed neb.   Dyspnea:  MMRC3 = can't walk 100 yards even at a slow pace at a flat grade s stopping due to sob   Cough: min mucoid/ sporadic though some worse in am Sleeping: on side on cpap  rec No change in meds Work perfecting inhaler technique:   10/02/18  To er with bnp up    10/07/2018  f/u ov/Phillip [name] re: worse sob /cough/ congested  x one month  p d/c ppi 6 weeks prior to [facility] Complaint  Patient presents with  . Acute Visit    increased SOB and chest tightness x several  wks. He is using his albuterol inhaler and neb about 2 x per wk.  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   Sleeps as above   without nocturnal  or early am exacerbation  of respiratory  c/o's or need for noct saba. Also denies any obvious fluctuation of symptoms with weather or environmental changes or other aggravating or alleviating factors except as outlined above   No unusual exposure hx or  h/o childhood pna/ asthma or knowledge of premature birth.  Current Allergies, Complete Past Medical History, Past Surgical History, Family History, and Social History were reviewed in [facility] record.  ROS  The following are not active complaints unless bolded Hoarseness, sore throat, dysphagia, dental problems, itching, sneezing,  nasal congestion or discharge of excess mucus or purulent secretions, ear ache,   fever, chills, sweats, unintended wt loss or wt gain, classically pleuritic cp,  orthopnea pnd or arm/hand swelling  or leg swelling, presyncope, palpitations, abdominal pain, anorexia, nausea, vomiting, diarrhea  or change in bowel habits or change in bladder habits, change in stools or change in urine, dysuria, hematuria,  rash, arthralgias, visual complaints, headache, numbness, weakness or ataxia or problems with walking or coordination,  change in mood or  memory.        Current Meds  Medication Sig  . albuterol (PROAIR HFA) 108 (90 Base) MCG/ACT inhaler Inhale 2 puffs into the lungs every 4 (four) hours as needed for wheezing or shortness of breath.  [name] albuterol (PROVENTIL) (2.5 MG/3ML) 0.083% nebulizer solution Take 3 mLs (2.5 mg total) by nebulization every 6 (six) hours as needed for wheezing or shortness of breath.  . allopurinol (ZYLOPRIM) 300 MG tablet TAKE 1 TABLET (300 MG TOTAL) DAILY BY MOUTH.  . budesonide-formoterol (SYMBICORT) 80-4.5 MCG/ACT inhaler USE 2 INHALATIONS TWICE A DAY (NEED APPOINTMENT) (Patient taking differently: Inhale 2 puffs into the lungs 2 (two) times a day. )  . carvedilol (COREG) 6.25 MG tablet TAKE 1 TABLET TWICE A DAY WITH MEALS (Patient taking differently: Take 6.25 mg by mouth 2 (two) times daily with a meal. )  . cloNIDine (CATAPRES) 0.1 MG tablet TAKE 1 TABLET BY MOUTH THREE TIMES A DAY (Patient taking differently: Take 0.1 mg by mouth 3 (three) times daily. )  . colchicine 0.6 MG tablet Take 1 tablet (0.6 mg total) by  mouth daily as needed (gout).  . famotidine (PEPCID) 20 MG tablet One at bedtime (Patient taking differently: Take 20 mg by mouth at bedtime. )  . hydrALAZINE (APRESOLINE) 50 MG tablet TAKE ONE AND ONE-HALF TABLETS THREE TIMES A DAY (Patient taking differently: Take 75 mg by mouth 3 (three) times daily. )  . montelukast (SINGULAIR) 10 MG tablet TAKE 1 TABLET BY MOUTH EVERYDAY AT BEDTIME (Patient taking differently:  Take 10 mg by mouth at bedtime. )  . naproxen sodium (ALEVE) 220 MG tablet Take 440 mg by mouth 2 (two) times daily as needed (pain).  [name] oxymetazoline (AFRIN NASAL SPRAY) 0.05 % nasal spray Place 1 spray into both nostrils 2 (two) times daily as needed for congestion.  .     . potassium chloride SA (KLOR-CON M20) 20 MEQ tablet TAKE 2 TABLETS DAILY (Patient taking differently: Take 40 mEq by mouth daily. )  . torsemide (DEMADEX) 20 MG tablet Take 3 tablets (60 mg total) by mouth 2 (two) times daily.  [name] UNABLE TO FIND Med Name: CPAP with sleep  . XARELTO 20 MG TABS tablet TAKE 1 TABLET DAILY WITH SUPPER (Patient taking differently: Take 20 mg by mouth daily with supper. )              Objective:   Physical Exam  Obese bm nad vigourous throat swelling   10/07/2018          374   05/14/2018         382  12/17/2017          379  06/16/2017          374  03/09/2017      376  12/16/2016        372  09/30/2016        358  07/21/2016        362  04/22/2016        374  01/21/2016      390  12/27/2015        387   09/10/2015       372   07/24/15 389 lb 6.4 oz (176.631 kg)  07/08/15 384 lb 3.2 oz (174.272 kg)  06/21/15 390 lb 8 oz (177.13 kg)      HEENT: nl dentition, turbinates bilaterally, and oropharynx. Nl external ear canals without cough reflex   NECK :  without JVD/Nodes/TM/ nl carotid upstrokes bilaterally   LUNGS: no acc muscle use,  Nl contour chest which is clear to A and P bilaterally without cough on insp or exp maneuvers   CV:  RRR  no s3 or murmur or increase in P2,  and no LE edema   ABD:  Massively obese/ soft and nontender with nl inspiratory excursion in the supine position. No bruits or organomegaly appreciated, bowel sounds nl  MS:  Nl gait/ ext warm without deformities, calf tenderness, cyanosis or clubbing No obvious joint restrictions   SKIN: warm and dry without lesions    NEURO:  alert, approp, nl sensorium with  no motor or cerebellar deficits apparent.       I personally reviewed images and agree with radiology impression as follows:  CXR:   10/02/2018  1. Cardiomegaly with mild perihilar vascular congestion without overt pulmonary edema. 2. No other active cardiopulmonary disease.   Labs ordered/ reviewed:      Chemistry      Component Value Date/Time   NA 144 10/02/2018 0400   NA 147 (H) 01/26/2017 1444   K 3.4 (L) 10/02/2018 0400   CL 104 10/02/2018 0400   CO2 29 10/02/2018 0400   BUN 28 (H) 10/02/2018 0400   BUN 24 01/26/2017 1444   CREATININE 2.07 (H) 10/02/2018 0400   CREATININE 1.79 (H) 02/25/2016 1352      Component Value Date/Time   CALCIUM 9.4 10/02/2018 0400   ALKPHOS 89 10/02/2018 0400   AST 26 10/02/2018 0400   ALT  22 10/02/2018 0400   BILITOT 0.8 10/02/2018 0400        Lab Results  Component Value Date   WBC 4.7 10/02/2018   HGB 13.3 10/02/2018   HCT 44.4 10/02/2018   MCV 92.7 10/02/2018   PLT 182 10/02/2018        ekg p walking :  CAF  no def ischemic changes       Lab Results  Component Value Date   TSH 2.74 10/07/2018     Labs ordered 10/07/2018   Trop  0.05   Assessment:
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
## 2018-10-08 ENCOUNTER — Encounter: Payer: Self-pay | Admitting: Internal Medicine

## 2018-10-11 ENCOUNTER — Telehealth (HOSPITAL_COMMUNITY): Payer: Self-pay | Admitting: Vascular Surgery

## 2018-10-11 ENCOUNTER — Ambulatory Visit (HOSPITAL_COMMUNITY)
Admission: RE | Admit: 2018-10-11 | Discharge: 2018-10-11 | Disposition: A | Discharge status: Home / Self Care | Payer: Managed Care, Other (non HMO) | Source: Ambulatory Visit | Attending: Adult Health | Admitting: Adult Health

## 2018-10-11 ENCOUNTER — Encounter (HOSPITAL_COMMUNITY): Payer: Self-pay

## 2018-10-11 ENCOUNTER — Other Ambulatory Visit: Payer: Self-pay

## 2018-10-11 ENCOUNTER — Ambulatory Visit (HOSPITAL_BASED_OUTPATIENT_CLINIC_OR_DEPARTMENT_OTHER)
Admission: RE | Admit: 2018-10-11 | Discharge: 2018-10-11 | Disposition: A | Discharge status: Home / Self Care | Payer: Managed Care, Other (non HMO) | Source: Ambulatory Visit | Attending: Internal Medicine | Admitting: Internal Medicine

## 2018-10-11 ENCOUNTER — Encounter (HOSPITAL_COMMUNITY): Payer: Self-pay | Admitting: Internal Medicine

## 2018-10-11 VITALS — BP 145/91 | HR 101 | Wt 379.4 lb

## 2018-10-11 DIAGNOSIS — N184 Chronic kidney disease, stage 4 (severe): Secondary | ICD-10-CM | POA: Diagnosis present

## 2018-10-11 DIAGNOSIS — Z86711 Personal history of pulmonary embolism: Secondary | ICD-10-CM | POA: Diagnosis not present

## 2018-10-11 DIAGNOSIS — I5032 Chronic diastolic (congestive) heart failure: Secondary | ICD-10-CM

## 2018-10-11 DIAGNOSIS — Z91018 Allergy to other foods: Secondary | ICD-10-CM | POA: Insufficient documentation

## 2018-10-11 DIAGNOSIS — G4733 Obstructive sleep apnea (adult) (pediatric): Secondary | ICD-10-CM | POA: Diagnosis not present

## 2018-10-11 DIAGNOSIS — Z87891 Personal history of nicotine dependence: Secondary | ICD-10-CM | POA: Insufficient documentation

## 2018-10-11 DIAGNOSIS — I080 Rheumatic disorders of both mitral and aortic valves: Secondary | ICD-10-CM | POA: Diagnosis not present

## 2018-10-11 DIAGNOSIS — J449 Chronic obstructive pulmonary disease, unspecified: Secondary | ICD-10-CM | POA: Insufficient documentation

## 2018-10-11 DIAGNOSIS — I130 Hypertensive heart and chronic kidney disease with heart failure and stage 1 through stage 4 chronic kidney disease, or unspecified chronic kidney disease: Secondary | ICD-10-CM | POA: Diagnosis not present

## 2018-10-11 DIAGNOSIS — Z7901 Long term (current) use of anticoagulants: Secondary | ICD-10-CM | POA: Insufficient documentation

## 2018-10-11 DIAGNOSIS — Z79899 Other long term (current) drug therapy: Secondary | ICD-10-CM | POA: Diagnosis not present

## 2018-10-11 DIAGNOSIS — Z6841 Body Mass Index (BMI) 40.0 and over, adult: Secondary | ICD-10-CM | POA: Insufficient documentation

## 2018-10-11 DIAGNOSIS — I5043 Acute on chronic combined systolic (congestive) and diastolic (congestive) heart failure: Secondary | ICD-10-CM | POA: Insufficient documentation

## 2018-10-11 DIAGNOSIS — Z9989 Dependence on other enabling machines and devices: Secondary | ICD-10-CM

## 2018-10-11 DIAGNOSIS — I4892 Unspecified atrial flutter: Secondary | ICD-10-CM | POA: Insufficient documentation

## 2018-10-11 DIAGNOSIS — Z86718 Personal history of other venous thrombosis and embolism: Secondary | ICD-10-CM | POA: Insufficient documentation

## 2018-10-11 DIAGNOSIS — I480 Paroxysmal atrial fibrillation: Secondary | ICD-10-CM | POA: Diagnosis not present

## 2018-10-11 DIAGNOSIS — I10 Essential (primary) hypertension: Secondary | ICD-10-CM

## 2018-10-11 DIAGNOSIS — Z7951 Long term (current) use of inhaled steroids: Secondary | ICD-10-CM | POA: Insufficient documentation

## 2018-10-11 DIAGNOSIS — Z8249 Family history of ischemic heart disease and other diseases of the circulatory system: Secondary | ICD-10-CM | POA: Diagnosis not present

## 2018-10-11 DIAGNOSIS — Z833 Family history of diabetes mellitus: Secondary | ICD-10-CM | POA: Insufficient documentation

## 2018-10-11 DIAGNOSIS — M109 Gout, unspecified: Secondary | ICD-10-CM | POA: Insufficient documentation

## 2018-10-11 DIAGNOSIS — R9431 Abnormal electrocardiogram [ECG] [EKG]: Secondary | ICD-10-CM | POA: Insufficient documentation

## 2018-10-11 LAB — COMPREHENSIVE METABOLIC PANEL
ALT: 18 U/L (ref 0–44)
AST: 21 U/L (ref 15–41)
Albumin: 3.7 g/dL (ref 3.5–5.0)
Alkaline Phosphatase: 90 U/L (ref 38–126)
Anion gap: 8 (ref 5–15)
BUN: 23 mg/dL — ABNORMAL HIGH (ref 6–20)
CO2: 27 mmol/L (ref 22–32)
Calcium: 9.2 mg/dL (ref 8.9–10.3)
Chloride: 106 mmol/L (ref 98–111)
Creatinine, Ser: 2.3 mg/dL — ABNORMAL HIGH (ref 0.61–1.24)
GFR calc Af Amer: 35 mL/min — ABNORMAL LOW (ref >60)
GFR calc non Af Amer: 31 mL/min — ABNORMAL LOW (ref >60)
Glucose, Bld: 86 mg/dL (ref 70–99)
Potassium: 3.9 mmol/L (ref 3.5–5.1)
Sodium: 141 mmol/L (ref 135–145)
Total Bilirubin: 0.9 mg/dL (ref 0.3–1.2)
Total Protein: 6.2 g/dL — ABNORMAL LOW (ref 6.5–8.1)

## 2018-10-11 LAB — T4, FREE: Free T4: 1.23 ng/dL — ABNORMAL HIGH (ref 0.61–1.12)

## 2018-10-11 LAB — MAGNESIUM: Magnesium: 2.3 mg/dL (ref 1.7–2.4)

## 2018-10-11 LAB — CBC
HCT: 41.1 % (ref 39.0–52.0)
Hemoglobin: 12.8 g/dL — ABNORMAL LOW (ref 13.0–17.0)
MCH: 28.2 pg (ref 26.0–34.0)
MCHC: 31.1 g/dL (ref 30.0–36.0)
MCV: 90.5 fL (ref 80.0–100.0)
Platelets: 171 10*3/uL (ref 150–400)
RBC: 4.54 MIL/uL (ref 4.22–5.81)
RDW: 15.8 % — ABNORMAL HIGH (ref 11.5–15.5)
WBC: 3.9 10*3/uL — ABNORMAL LOW (ref 4.0–10.5)
nRBC: 0 % (ref 0.0–0.2)

## 2018-10-11 LAB — TSH: TSH: 2.86 u[IU]/mL (ref 0.350–4.500)

## 2018-10-11 NOTE — Progress Notes (Signed)
I have never seen him and I have no idea how to get the info into [employer].  Phillip [name] will need to touch base with Phillip [name] to find out how to do this.  It looks like his PAP had been followed by Pulmonary.  I do not know how to get patient's who are already on PAP into [employer] to follow.

## 2018-10-11 NOTE — Telephone Encounter (Signed)
Left pt message giving AFIB APPT 7/28 @ 10 AM, asked pt to call back to confirm appt

## 2018-10-11 NOTE — Patient Instructions (Addendum)
Lab work done today. We will notify you of any abnormal lab work. No news is good news.  EKG done today.   You have been referred to [facility]. Their office will be in contact with you.  Your physician has recommended that you have a Cardioversion (DCCV). Electrical Cardioversion uses a jolt of electricity to your heart either through paddles or wired patches attached to your chest. This is a controlled, usually prescheduled, procedure. Defibrillation is done under light anesthesia in the hospital, and you usually go home the day of the procedure. This is done to get your heart back into a normal rhythm. You are not awake for the procedure. Please see the instruction sheet given to you today.   Please follow up with the [facility] in 1 month.  At the [facility], you and your health needs are our priority. As part of our continuing mission to provide you with exceptional heart care, we have created designated Provider Care Teams. These Care Teams include your primary Cardiologist (physician) and Advanced Practice Providers (APPs- Physician Assistants and Nurse Practitioners) who all work together to provide you with the care you need, when you need it.   You may see any of the following providers on your designated Care Team at your next follow up: [name] Dr Glori Bickers . Dr Loralie Champagne . Darrick Grinder, NP

## 2018-10-11 NOTE — Progress Notes (Signed)
  Echocardiogram 2D Echocardiogram has been performed.  Phillip [name] 10/11/2018, 11:42 AM

## 2018-10-11 NOTE — Progress Notes (Signed)
Sign and held orders placed for DCCV on Thursday 10/14/18.

## 2018-10-11 NOTE — H&P (View-Only) (Signed)
Advanced Heart Failure Clinic Note   Referring Physician: [name] Primary Care: Silvio Pate Primary Cardiologist: [name]  EP: Dr Rayann Heman HF MD: Dr Haroldine Laws   HPI: Phillip [name] is a 58 y.o. male hx of PAF, remote PE, hx of DVT, OSA, morbid obesity, CKD Stage III,  HTN, and  combined chronic systolic/diastolic. S/P A Flutter ablation May 2019 .    EF in 2015 40-45% Echo 12/21/16 with EF 55-60% G2DD, mild aortic regurgitation. Last nuc 2008 no ischemia, no hx of cath due to CKD.   Developed AFL and underwent AFL ablation in 5/19.  Over past 2-3 weeks have felt bad. More SOB. Having some pressure in his chest and arm. Seen in [facility] ED and found to be in AFL/AF in 80s. SOB with any activity. + LE edema, orthopnea or PND. Taking lasix 60 bid which works intermittently. Using CPAP every night. Says chest discomfort comes and goes.   Echo today EF 30-35%    Review of systems complete and found to be negative unless listed in HPI.    Past Medical History:  Diagnosis Date  . CHF (congestive heart failure) ([place]) 11/15   EF 40-45%, improved with follow-up  . COPD (chronic obstructive pulmonary disease) ([place])   . Gout   . History of DVT (deep vein thrombosis)   . Hypertension   . Hypertensive cardiovascular disease   . Kidney dysfunction   . Morbid obesity ([place])   . OSA on CPAP   . Paroxysmal atrial fibrillation ([facility])   . PNA (pneumonia)   . Pulmonary embolism ([place]) 2010   left leg DVT   Current Outpatient Medications  Medication Sig Dispense Refill  . albuterol (PROAIR HFA) 108 (90 Base) MCG/ACT inhaler Inhale 2 puffs into the lungs every 4 (four) hours as needed for wheezing or shortness of breath. 1 Inhaler 3  . albuterol (PROVENTIL) (2.5 MG/3ML) 0.083% nebulizer solution Take 3 mLs (2.5 mg total) by nebulization every 6 (six) hours as needed for wheezing or shortness of breath. 360 mL 11  . allopurinol (ZYLOPRIM) 300 MG tablet TAKE 1 TABLET (300 MG TOTAL) DAILY BY MOUTH. 30 tablet 11  .  budesonide-formoterol (SYMBICORT) 80-4.5 MCG/ACT inhaler USE 2 INHALATIONS TWICE A DAY (NEED APPOINTMENT) (Patient taking differently: Inhale 2 puffs into the lungs 2 (two) times a day. ) 30.6 g 3  . carvedilol (COREG) 6.25 MG tablet TAKE 1 TABLET TWICE A DAY WITH MEALS (Patient taking differently: Take 6.25 mg by mouth 2 (two) times daily with a meal. ) 180 tablet 3  . cloNIDine (CATAPRES) 0.1 MG tablet TAKE 1 TABLET BY MOUTH THREE TIMES A DAY (Patient taking differently: Take 0.1 mg by mouth 3 (three) times daily. ) 90 tablet 3  . colchicine 0.6 MG tablet Take 1 tablet (0.6 mg total) by mouth daily as needed (gout). 30 tablet 1  . famotidine (PEPCID) 20 MG tablet One at bedtime (Patient taking differently: Take 20 mg by mouth at bedtime. ) 30 tablet 11  . hydrALAZINE (APRESOLINE) 50 MG tablet TAKE ONE AND ONE-HALF TABLETS THREE TIMES A DAY (Patient taking differently: Take 75 mg by mouth 3 (three) times daily. ) 135 tablet 11  . montelukast (SINGULAIR) 10 MG tablet TAKE 1 TABLET BY MOUTH EVERYDAY AT BEDTIME (Patient taking differently: Take 10 mg by mouth at bedtime. ) 30 tablet 5  . naproxen sodium (ALEVE) 220 MG tablet Take 440 mg by mouth 2 (two) times daily as needed (pain).    [name] oxymetazoline (AFRIN NASAL  SPRAY) 0.05 % nasal spray Place 1 spray into both nostrils 2 (two) times daily as needed for congestion.    . pantoprazole (PROTONIX) 40 MG tablet Take 30- 60 min before your first and last meals of the day 60 tablet 5  . potassium chloride SA (KLOR-CON M20) 20 MEQ tablet TAKE 2 TABLETS DAILY (Patient taking differently: Take 40 mEq by mouth daily. ) 180 tablet 3  . torsemide (DEMADEX) 20 MG tablet Take 3 tablets (60 mg total) by mouth 2 (two) times daily. 540 tablet 3  . UNABLE TO FIND Med Name: CPAP with sleep    . XARELTO 20 MG TABS tablet TAKE 1 TABLET DAILY WITH SUPPER (Patient taking differently: Take 20 mg by mouth daily with supper. ) 90 tablet 4   No current facility-administered  medications for this encounter.    Allergies  Allergen Reactions  . Other Anaphylaxis    mushrooms    Social History   Socioeconomic History  . Marital status: Divorced    Spouse name: Not on file  . Number of children: 2  . Years of education: 34  . Highest education level: Not on file  Occupational History  . Occupation: Drives dump truck    Comment: Part time  . Occupation: Airline pilot    Comment: Disabled  Social Needs  . Financial resource strain: Not hard at all  . Food insecurity    Worry: Never true    Inability: Never true  . Transportation needs    Medical: No    Non-medical: No  Tobacco Use  . Smoking status: Former Smoker    Packs/day: 0.25    Years: 1.00    Pack years: 0.25    Types: Cigarettes    Quit date: 07/23/1989    Years since quitting: 29.2  . Smokeless tobacco: Never Used  Substance and Sexual Activity  . Alcohol use: No    Alcohol/week: 0.0 standard drinks  . Drug use: No  . Sexual activity: Not Currently  Lifestyle  . Physical activity    Days per week: Not on file    Minutes per session: Not on file  . Stress: Not on file  Relationships  . Social Herbalist on phone: Not on file    Gets together: Not on file    Attends religious service: Not on file    Active member of club or organization: Not on file    Attends meetings of clubs or organizations: Not on file    Relationship status: Not on file  . Intimate partner violence    Fear of current or ex partner: Not on file    Emotionally abused: Not on file    Physically abused: Not on file    Forced sexual activity: Not on file  Other Topics Concern  . Not on file  Social History Narrative   Pt lives in [place], alone.   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   Has 2 sons in [place]      No living will   Would want sons to make decisions for him   Would accept resuscitation    Family History  Problem Relation Age of Onset  .  Hypertension Mother   . Diabetes Father   . CAD Father   . Stomach cancer Father   . Clotting disorder Father   . Heart disease Father        transplant  . Allergies Brother  Vitals:   10/11/18 1205  BP: (!) 145/91  Pulse: (!) 101  SpO2: 99%  Weight: (!) 172.1 kg (379 lb 6.4 oz)   Wt Readings from Last 3 Encounters:  10/11/18 (!) 172.1 kg (379 lb 6.4 oz)  10/07/18 (!) 169.6 kg (374 lb)  05/14/18 (!) 173.3 kg (382 lb)   PHYSICAL EXAM: General:  Morbidly obese male in NAD HEENT: normal Neck: supple. JVP hard to see but appears mildly elevated Carotids 2+ bilat; no bruits. No lymphadenopathy or thryomegaly appreciated. Cor: PMI nondisplaced.Irregular Irregular No rubs, gallops or murmurs. Lungs: clear Abdomen: markedly obese soft, nontender, nondistended. No bruits or masses. Good bowel sounds. Extremities: no cyanosis, clubbing, rash, trace edema Neuro: alert & orientedx3, cranial nerves grossly intact. moves all 4 extremities w/o difficulty. Affect pleasant  ECG AF 85 + LVH Personally reviewed   ASSESSMENT & PLAN:  1. Acute on chronic systolic/diastolic HF - Echo 11/1692 LVEF 55-60%, Grade 2 DD. - EF today back down to 30-35%. Suspect due to AF but rate not overly elevated. HTN may also be playing a role.  - NYHA III.  - Volume status mildly elevated but seems to be improving - Continue torsemide 60 bid for now. Can increase to 80 mg twice a day as needed for worsening symptoms.  - Continue hydralazine and carvedilol - With recurrently reduced EF ideally would proceed with cath but he has CKD and I don't want to stop AC prior to DC-CV. Once more stable can reconsider cath if EF doesn't improve with restoration of NSR and control of HTN - No ACE/ARB with CKD - Ideally will need to stop clonidine with low EF but will wait to see if EF recovers with DC-CV  2. PAF / Typical A-flutter, paroxysmal - S/P AFL ablation 07/2017. - No back in AF and I feel like this is driving  most of his symptoms - Continue Xarelto 20 mg daily. (has not missed any doses) - No bleeding issues.   - Will plan DC-CV later this week. If fails to maintain NSR will likely need AA - He reports compliance with CPAP. Needs weight loss - Refer back to AF Clinic  3. Hypertension - BP high here but he states he just walked across hospital and that it is usually under better control  - Almost all BPs in flowsheet over past month have had SBP 170-190. - After DC-CV will need to see frequently for BP adjustment - Will likely need Paramedicine to assure compliance  6. Morbid obesity - Body mass index is 56.03 kg/m.  - Desperately needs weight loss  5. CKD IV - Creatinine baseline ~2.  - Check BMET today  6. OSA on CPAP - reports good compliance. - I will see if we can check his downloads  Total time spent 45 minutes. Over half that time spent discussing above.    Glori Bickers, MD  12:21 PM

## 2018-10-11 NOTE — Progress Notes (Signed)
Advanced Heart Failure Clinic Note   Referring Physician: [name] Primary Care: Silvio Pate Primary Cardiologist: [name]  EP: Dr Rayann Heman HF MD: Dr Haroldine Laws   HPI: Phillip [name] is a 58 y.o. male hx of PAF, remote PE, hx of DVT, OSA, morbid obesity, CKD Stage III,  HTN, and  combined chronic systolic/diastolic. S/P A Flutter ablation May 2019 .    EF in 2015 40-45% Echo 12/21/16 with EF 55-60% G2DD, mild aortic regurgitation. Last nuc 2008 no ischemia, no hx of cath due to CKD.   Developed AFL and underwent AFL ablation in 5/19.  Over past 2-3 weeks have felt bad. More SOB. Having some pressure in his chest and arm. Seen in [facility] ED and found to be in AFL/AF in 80s. SOB with any activity. + LE edema, orthopnea or PND. Taking lasix 60 bid which works intermittently. Using CPAP every night. Says chest discomfort comes and goes.   Echo today EF 30-35%    Review of systems complete and found to be negative unless listed in HPI.    Past Medical History:  Diagnosis Date  . CHF (congestive heart failure) ([place]) 11/15   EF 40-45%, improved with follow-up  . COPD (chronic obstructive pulmonary disease) ([place])   . Gout   . History of DVT (deep vein thrombosis)   . Hypertension   . Hypertensive cardiovascular disease   . Kidney dysfunction   . Morbid obesity ([place])   . OSA on CPAP   . Paroxysmal atrial fibrillation ([facility])   . PNA (pneumonia)   . Pulmonary embolism ([place]) 2010   left leg DVT   Current Outpatient Medications  Medication Sig Dispense Refill  . albuterol (PROAIR HFA) 108 (90 Base) MCG/ACT inhaler Inhale 2 puffs into the lungs every 4 (four) hours as needed for wheezing or shortness of breath. 1 Inhaler 3  . albuterol (PROVENTIL) (2.5 MG/3ML) 0.083% nebulizer solution Take 3 mLs (2.5 mg total) by nebulization every 6 (six) hours as needed for wheezing or shortness of breath. 360 mL 11  . allopurinol (ZYLOPRIM) 300 MG tablet TAKE 1 TABLET (300 MG TOTAL) DAILY BY MOUTH. 30 tablet 11  .  budesonide-formoterol (SYMBICORT) 80-4.5 MCG/ACT inhaler USE 2 INHALATIONS TWICE A DAY (NEED APPOINTMENT) (Patient taking differently: Inhale 2 puffs into the lungs 2 (two) times a day. ) 30.6 g 3  . carvedilol (COREG) 6.25 MG tablet TAKE 1 TABLET TWICE A DAY WITH MEALS (Patient taking differently: Take 6.25 mg by mouth 2 (two) times daily with a meal. ) 180 tablet 3  . cloNIDine (CATAPRES) 0.1 MG tablet TAKE 1 TABLET BY MOUTH THREE TIMES A DAY (Patient taking differently: Take 0.1 mg by mouth 3 (three) times daily. ) 90 tablet 3  . colchicine 0.6 MG tablet Take 1 tablet (0.6 mg total) by mouth daily as needed (gout). 30 tablet 1  . famotidine (PEPCID) 20 MG tablet One at bedtime (Patient taking differently: Take 20 mg by mouth at bedtime. ) 30 tablet 11  . hydrALAZINE (APRESOLINE) 50 MG tablet TAKE ONE AND ONE-HALF TABLETS THREE TIMES A DAY (Patient taking differently: Take 75 mg by mouth 3 (three) times daily. ) 135 tablet 11  . montelukast (SINGULAIR) 10 MG tablet TAKE 1 TABLET BY MOUTH EVERYDAY AT BEDTIME (Patient taking differently: Take 10 mg by mouth at bedtime. ) 30 tablet 5  . naproxen sodium (ALEVE) 220 MG tablet Take 440 mg by mouth 2 (two) times daily as needed (pain).    [name] oxymetazoline (AFRIN NASAL  SPRAY) 0.05 % nasal spray Place 1 spray into both nostrils 2 (two) times daily as needed for congestion.    . pantoprazole (PROTONIX) 40 MG tablet Take 30- 60 min before your first and last meals of the day 60 tablet 5  . potassium chloride SA (KLOR-CON M20) 20 MEQ tablet TAKE 2 TABLETS DAILY (Patient taking differently: Take 40 mEq by mouth daily. ) 180 tablet 3  . torsemide (DEMADEX) 20 MG tablet Take 3 tablets (60 mg total) by mouth 2 (two) times daily. 540 tablet 3  . UNABLE TO FIND Med Name: CPAP with sleep    . XARELTO 20 MG TABS tablet TAKE 1 TABLET DAILY WITH SUPPER (Patient taking differently: Take 20 mg by mouth daily with supper. ) 90 tablet 4   No current facility-administered  medications for this encounter.    Allergies  Allergen Reactions  . Other Anaphylaxis    mushrooms    Social History   Socioeconomic History  . Marital status: Divorced    Spouse name: Not on file  . Number of children: 2  . Years of education: 80  . Highest education level: Not on file  Occupational History  . Occupation: Drives dump truck    Comment: Part time  . Occupation: Airline pilot    Comment: Disabled  Social Needs  . Financial resource strain: Not hard at all  . Food insecurity    Worry: Never true    Inability: Never true  . Transportation needs    Medical: No    Non-medical: No  Tobacco Use  . Smoking status: Former Smoker    Packs/day: 0.25    Years: 1.00    Pack years: 0.25    Types: Cigarettes    Quit date: 07/23/1989    Years since quitting: 29.2  . Smokeless tobacco: Never Used  Substance and Sexual Activity  . Alcohol use: No    Alcohol/week: 0.0 standard drinks  . Drug use: No  . Sexual activity: Not Currently  Lifestyle  . Physical activity    Days per week: Not on file    Minutes per session: Not on file  . Stress: Not on file  Relationships  . Social Herbalist on phone: Not on file    Gets together: Not on file    Attends religious service: Not on file    Active member of club or organization: Not on file    Attends meetings of clubs or organizations: Not on file    Relationship status: Not on file  . Intimate partner violence    Fear of current or ex partner: Not on file    Emotionally abused: Not on file    Physically abused: Not on file    Forced sexual activity: Not on file  Other Topics Concern  . Not on file  Social History Narrative   Pt lives in [place], alone.   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   Has 2 sons in [place]      No living will   Would want sons to make decisions for him   Would accept resuscitation    Family History  Problem Relation Age of Onset  .  Hypertension Mother   . Diabetes Father   . CAD Father   . Stomach cancer Father   . Clotting disorder Father   . Heart disease Father        transplant  . Allergies Brother  Vitals:   10/11/18 1205  BP: (!) 145/91  Pulse: (!) 101  SpO2: 99%  Weight: (!) 172.1 kg (379 lb 6.4 oz)   Wt Readings from Last 3 Encounters:  10/11/18 (!) 172.1 kg (379 lb 6.4 oz)  10/07/18 (!) 169.6 kg (374 lb)  05/14/18 (!) 173.3 kg (382 lb)   PHYSICAL EXAM: General:  Morbidly obese male in NAD HEENT: normal Neck: supple. JVP hard to see but appears mildly elevated Carotids 2+ bilat; no bruits. No lymphadenopathy or thryomegaly appreciated. Cor: PMI nondisplaced.Irregular Irregular No rubs, gallops or murmurs. Lungs: clear Abdomen: markedly obese soft, nontender, nondistended. No bruits or masses. Good bowel sounds. Extremities: no cyanosis, clubbing, rash, trace edema Neuro: alert & orientedx3, cranial nerves grossly intact. moves all 4 extremities w/o difficulty. Affect pleasant  ECG AF 85 + LVH Personally reviewed   ASSESSMENT & PLAN:  1. Acute on chronic systolic/diastolic HF - Echo 09/4161 LVEF 55-60%, Grade 2 DD. - EF today back down to 30-35%. Suspect due to AF but rate not overly elevated. HTN may also be playing a role.  - NYHA III.  - Volume status mildly elevated but seems to be improving - Continue torsemide 60 bid for now. Can increase to 80 mg twice a day as needed for worsening symptoms.  - Continue hydralazine and carvedilol - With recurrently reduced EF ideally would proceed with cath but he has CKD and I don't want to stop AC prior to DC-CV. Once more stable can reconsider cath if EF doesn't improve with restoration of NSR and control of HTN - No ACE/ARB with CKD - Ideally will need to stop clonidine with low EF but will wait to see if EF recovers with DC-CV  2. PAF / Typical A-flutter, paroxysmal - S/P AFL ablation 07/2017. - No back in AF and I feel like this is driving  most of his symptoms - Continue Xarelto 20 mg daily. (has not missed any doses) - No bleeding issues.   - Will plan DC-CV later this week. If fails to maintain NSR will likely need AA - He reports compliance with CPAP. Needs weight loss - Refer back to AF Clinic  3. Hypertension - BP high here but he states he just walked across hospital and that it is usually under better control  - Almost all BPs in flowsheet over past month have had SBP 170-190. - After DC-CV will need to see frequently for BP adjustment - Will likely need Paramedicine to assure compliance  6. Morbid obesity - Body mass index is 56.03 kg/m.  - Desperately needs weight loss  5. CKD IV - Creatinine baseline ~2.  - Check BMET today  6. OSA on CPAP - reports good compliance. - I will see if we can check his downloads  Total time spent 45 minutes. Over half that time spent discussing above.    Glori Bickers, MD  12:21 PM

## 2018-10-12 ENCOUNTER — Other Ambulatory Visit (HOSPITAL_COMMUNITY)
Admission: RE | Admit: 2018-10-12 | Discharge: 2018-10-12 | Disposition: A | Discharge status: Home / Self Care | Payer: Managed Care, Other (non HMO) | Source: Ambulatory Visit | Attending: Internal Medicine | Admitting: Internal Medicine

## 2018-10-12 DIAGNOSIS — Z1159 Encounter for screening for other viral diseases: Secondary | ICD-10-CM | POA: Diagnosis not present

## 2018-10-12 LAB — SARS CORONAVIRUS 2 (TAT 6-24 HRS): SARS Coronavirus 2: NEGATIVE

## 2018-10-13 NOTE — Anesthesia Preprocedure Evaluation (Addendum)
Anesthesia Evaluation  Patient identified by MRN, date of birth, ID band Patient awake    Reviewed: Allergy & Precautions, NPO status , Patient's Chart, lab work & pertinent test results  History of Anesthesia Complications Negative for: history of anesthetic complications  Airway Mallampati: III  TM Distance: >3 FB Neck ROM: Full    Dental  (+) Dental Advisory Given, Teeth Intact   Pulmonary asthma , sleep apnea and Continuous Positive Airway Pressure Ventilation , COPD,  COPD inhaler, former smoker,     + decreased breath sounds      Cardiovascular hypertension, Pt. on medications +CHF and + DVT  + dysrhythmias Atrial Fibrillation  Rhythm:Irregular Rate:Normal   '20 TTE - EF 25-30%. LV cavity size was severely dilated. LV global hypokinesis without regional wall motion abnormalities. LA was moderately dilated. Mild AI.     Neuro/Psych negative neurological ROS  negative psych ROS   GI/Hepatic Neg liver ROS, GERD  Medicated and Controlled,  Endo/Other  Morbid obesity  Renal/GU CRFRenal disease     Musculoskeletal  Gout    Abdominal (+) + obese,   Peds  Hematology negative hematology ROS (+)   Anesthesia Other Findings   Reproductive/Obstetrics                            Anesthesia Physical Anesthesia Plan  ASA: IV  Anesthesia Plan: General   Post-op Pain Management:    Induction: Intravenous  PONV Risk Score and Plan: 2 and Treatment may vary due to age or medical condition and Propofol infusion  Airway Management Planned: Mask and Natural Airway  Additional Equipment: None  Intra-op Plan:   Post-operative Plan:   Informed Consent: I have reviewed the patients History and Physical, chart, labs and discussed the procedure including the risks, benefits and alternatives for the proposed anesthesia with the patient or authorized representative who has indicated his/her  understanding and acceptance.       Plan Discussed with: CRNA and Anesthesiologist  Anesthesia Plan Comments:        Anesthesia Quick Evaluation

## 2018-10-13 NOTE — Progress Notes (Signed)
Call placed to patient regarding procedure 10/14/2018. He states he has quarantined at home since COVID test. Denies any COVID related symptoms.

## 2018-10-14 ENCOUNTER — Ambulatory Visit (HOSPITAL_COMMUNITY): Payer: Managed Care, Other (non HMO) | Admitting: Anesthesiology

## 2018-10-14 ENCOUNTER — Other Ambulatory Visit: Payer: Self-pay

## 2018-10-14 ENCOUNTER — Ambulatory Visit (HOSPITAL_COMMUNITY)
Admission: RE | Admit: 2018-10-14 | Discharge: 2018-10-14 | Disposition: A | Discharge status: Home / Self Care | Payer: Managed Care, Other (non HMO) | Attending: Internal Medicine | Admitting: Internal Medicine

## 2018-10-14 ENCOUNTER — Encounter (HOSPITAL_COMMUNITY): Payer: Self-pay | Admitting: Certified Registered"

## 2018-10-14 ENCOUNTER — Encounter (HOSPITAL_COMMUNITY)
Admission: RE | Disposition: A | Discharge status: Home / Self Care | Payer: Self-pay | Source: Home / Self Care | Attending: Internal Medicine

## 2018-10-14 DIAGNOSIS — Z87891 Personal history of nicotine dependence: Secondary | ICD-10-CM | POA: Insufficient documentation

## 2018-10-14 DIAGNOSIS — I480 Paroxysmal atrial fibrillation: Secondary | ICD-10-CM | POA: Diagnosis present

## 2018-10-14 DIAGNOSIS — J449 Chronic obstructive pulmonary disease, unspecified: Secondary | ICD-10-CM | POA: Diagnosis not present

## 2018-10-14 DIAGNOSIS — M109 Gout, unspecified: Secondary | ICD-10-CM | POA: Insufficient documentation

## 2018-10-14 DIAGNOSIS — Z86711 Personal history of pulmonary embolism: Secondary | ICD-10-CM | POA: Diagnosis not present

## 2018-10-14 DIAGNOSIS — Z7901 Long term (current) use of anticoagulants: Secondary | ICD-10-CM | POA: Diagnosis not present

## 2018-10-14 DIAGNOSIS — I4891 Unspecified atrial fibrillation: Secondary | ICD-10-CM | POA: Diagnosis not present

## 2018-10-14 DIAGNOSIS — I130 Hypertensive heart and chronic kidney disease with heart failure and stage 1 through stage 4 chronic kidney disease, or unspecified chronic kidney disease: Secondary | ICD-10-CM | POA: Insufficient documentation

## 2018-10-14 DIAGNOSIS — Z6841 Body Mass Index (BMI) 40.0 and over, adult: Secondary | ICD-10-CM | POA: Diagnosis not present

## 2018-10-14 DIAGNOSIS — G4733 Obstructive sleep apnea (adult) (pediatric): Secondary | ICD-10-CM | POA: Insufficient documentation

## 2018-10-14 DIAGNOSIS — Z79899 Other long term (current) drug therapy: Secondary | ICD-10-CM | POA: Insufficient documentation

## 2018-10-14 DIAGNOSIS — N184 Chronic kidney disease, stage 4 (severe): Secondary | ICD-10-CM | POA: Insufficient documentation

## 2018-10-14 DIAGNOSIS — I4892 Unspecified atrial flutter: Secondary | ICD-10-CM | POA: Diagnosis not present

## 2018-10-14 DIAGNOSIS — I5043 Acute on chronic combined systolic (congestive) and diastolic (congestive) heart failure: Secondary | ICD-10-CM | POA: Insufficient documentation

## 2018-10-14 DIAGNOSIS — Z86718 Personal history of other venous thrombosis and embolism: Secondary | ICD-10-CM | POA: Diagnosis not present

## 2018-10-14 HISTORY — PX: CARDIOVERSION: SHX1299

## 2018-10-14 SURGERY — CARDIOVERSION
Anesthesia: General

## 2018-10-14 MED ORDER — PROPOFOL 10 MG/ML IV BOLUS
INTRAVENOUS | Status: DC | PRN
  Administered 2018-10-14: 80 mg via INTRAVENOUS

## 2018-10-14 MED ORDER — SODIUM CHLORIDE 0.9 % IV SOLN
INTRAVENOUS | Status: DC | PRN
  Administered 2018-10-14: 08:00:00 via INTRAVENOUS

## 2018-10-14 MED ORDER — LIDOCAINE 2% (20 MG/ML) 5 ML SYRINGE
INTRAMUSCULAR | Status: DC | PRN
  Administered 2018-10-14: 100 mg via INTRAVENOUS

## 2018-10-14 NOTE — Interval H&P Note (Signed)
History and Physical Interval Note:  10/14/2018 7:32 AM  Byrd Luevano  has presented today for surgery, with the diagnosis of a fib.  The various methods of treatment have been discussed with the patient and family. After consideration of risks, benefits and other options for treatment, the patient has consented to  Procedure(s): CARDIOVERSION (N/A) as a surgical intervention.  The patient's history has been reviewed, patient examined, no change in status, stable for surgery.  I have reviewed the patient's chart and labs.  Questions were answered to the patient's satisfaction.     Daniel Bensimhon

## 2018-10-14 NOTE — Transfer of Care (Signed)
Immediate Anesthesia Transfer of Care Note  Patient: Phillip [name]  Procedure(s) Performed: CARDIOVERSION (N/A )  Patient Location: Endoscopy Unit  Anesthesia Type:General  Level of Consciousness: awake and oriented  Airway & Oxygen Therapy: Patient Spontanous Breathing  Post-op Assessment: Report given to RN  Post vital signs: Reviewed and stable  Last Vitals:  Vitals Value Taken Time  BP 161/103 10/14/18 0748  Temp 36.4 C 10/14/18 0748  Pulse 66 10/14/18 0748  Resp 22 10/14/18 0748  SpO2 96 % 10/14/18 0748    Last Pain:  Vitals:   10/14/18 0748  TempSrc: Temporal  PainSc:          Complications: No apparent anesthesia complications

## 2018-10-14 NOTE — CV Procedure (Signed)
    DIRECT CURRENT CARDIOVERSION  [name]   MRN: [id number] DOB:  10-Aug-1960   ADMIT DATE: 10/14/2018   INDICATIONS: Atrial fibrillation    PROCEDURE:   Informed consent was obtained prior to the procedure. The risks, benefits and alternatives for the procedure were discussed and the patient comprehended these risks. Once an appropriate time out was taken, the patient had the defibrillator pads placed in the anterior and posterior position. The patient then underwent sedation by the anesthesia service. Once an appropriate level of sedation was achieved, the patient received a single biphasic, synchronized 200J shock. The patient did not convert with the first shock. I held manual pressure on the anterior pad and he received a second shock with prompt conversion to sinus rhythm. No apparent complications.  Glori Bickers, MD  8:20 AM

## 2018-10-14 NOTE — Anesthesia Postprocedure Evaluation (Signed)
Anesthesia Post Note  Patient: Phillip [name]  Procedure(s) Performed: CARDIOVERSION (N/A )     Patient location during evaluation: PACU Anesthesia Type: General Level of consciousness: awake and alert Pain management: pain level controlled Vital Signs Assessment: post-procedure vital signs reviewed and stable Respiratory status: spontaneous breathing, nonlabored ventilation and respiratory function stable Cardiovascular status: blood pressure returned to baseline and stable Postop Assessment: no apparent nausea or vomiting Anesthetic complications: no    Last Vitals:  Vitals:   10/14/18 0800 10/14/18 0810  BP: (!) 156/81 (!) 129/58  Pulse: (!) 55 (!) 50  Resp: 10   Temp:    SpO2: 96% 95%    Last Pain:  Vitals:   10/14/18 0810  TempSrc:   PainSc: 0-No pain                 Audry Pili

## 2018-10-15 ENCOUNTER — Encounter: Payer: Self-pay | Admitting: Nurse Practitioner

## 2018-10-15 ENCOUNTER — Ambulatory Visit (INDEPENDENT_AMBULATORY_CARE_PROVIDER_SITE_OTHER): Payer: Managed Care, Other (non HMO) | Admitting: Nurse Practitioner

## 2018-10-15 ENCOUNTER — Telehealth: Payer: Self-pay | Admitting: Internal Medicine

## 2018-10-15 DIAGNOSIS — R05 Cough: Secondary | ICD-10-CM

## 2018-10-15 DIAGNOSIS — R059 Cough, unspecified: Secondary | ICD-10-CM | POA: Insufficient documentation

## 2018-10-15 MED ORDER — AMOXICILLIN-POT CLAVULANATE 875-125 MG PO TABS: 1.0000 | ORAL_TABLET | Freq: Two times a day (BID) | ORAL | 0 refills | Status: DC

## 2018-10-15 NOTE — Patient Instructions (Addendum)
Will order Augmentin Continue albuterol Continue Symbicort Follow up with cardiology as directed  Follow up: Follow up with Dr. Melvyn Novas as already scheduled or sooner if needed.

## 2018-10-15 NOTE — Assessment & Plan Note (Signed)
Patient has a tele-visit today for an acute visit.  He complains of chest congestion, increased shortness of breath, chest tightness, and cough that is nonproductive.  He states that symptoms started on October 13, 2018.  He is compliant with Symbicort and albuterol.  He was recently started on Protonix at his last visit with Dr. Melvyn Novas on 10/07/2018.  Patient was in the ED yesterday for cardioversion due to recurrent atrial flutter.  Patient tolerated procedure well.  He was discharged home.  Patient is on torsemide and states that he is taking it as directed by cardiology.   Patient Instructions  Will order Augmentin Continue albuterol Continue Symbicort Follow up with cardiology as directed  Follow up: Follow up with Dr. Melvyn Novas as already scheduled or sooner if needed.
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
## 2018-10-15 NOTE — Telephone Encounter (Signed)
Spoke with pt, c/o increased sob, chest tightness, chest congestion.  Pt is unable to cough up anything from chest.    Denies fever, covid questionnaire negative aside from symptoms listed above. S/s present X2 days.   Has taken symbicort and albuterol inhaler and neb, states this did not help with s/s.  Requesting a zpak and pred taper.      Last seen 10/07/2018 with complaints of similar symptoms.  Pharmacy: [facility] [place].    MW please advise on recs.  Thanks!

## 2018-10-15 NOTE — Telephone Encounter (Signed)
Would prefer he do a televisit with NP today

## 2018-10-15 NOTE — Telephone Encounter (Signed)
Pt aware of recs. Scheduled for a televisit with Tonya today at 10:00.   Nothing further needed at this time- will close encounter.

## 2018-10-19 ENCOUNTER — Encounter (HOSPITAL_COMMUNITY): Payer: Self-pay | Admitting: Nurse Practitioner

## 2018-10-19 ENCOUNTER — Ambulatory Visit (HOSPITAL_COMMUNITY)
Admission: RE | Admit: 2018-10-19 | Discharge: 2018-10-19 | Disposition: A | Discharge status: Home / Self Care | Payer: Managed Care, Other (non HMO) | Source: Ambulatory Visit | Attending: Nurse Practitioner | Admitting: Nurse Practitioner

## 2018-10-19 ENCOUNTER — Other Ambulatory Visit: Payer: Self-pay

## 2018-10-19 VITALS — BP 146/68 | HR 63 | Ht 69.0 in | Wt 378.0 lb

## 2018-10-19 DIAGNOSIS — Z79899 Other long term (current) drug therapy: Secondary | ICD-10-CM | POA: Diagnosis not present

## 2018-10-19 DIAGNOSIS — I4892 Unspecified atrial flutter: Secondary | ICD-10-CM | POA: Diagnosis not present

## 2018-10-19 DIAGNOSIS — J449 Chronic obstructive pulmonary disease, unspecified: Secondary | ICD-10-CM | POA: Diagnosis not present

## 2018-10-19 DIAGNOSIS — I509 Heart failure, unspecified: Secondary | ICD-10-CM | POA: Diagnosis not present

## 2018-10-19 DIAGNOSIS — Z6841 Body Mass Index (BMI) 40.0 and over, adult: Secondary | ICD-10-CM | POA: Diagnosis not present

## 2018-10-19 DIAGNOSIS — Z833 Family history of diabetes mellitus: Secondary | ICD-10-CM | POA: Diagnosis not present

## 2018-10-19 DIAGNOSIS — Z86711 Personal history of pulmonary embolism: Secondary | ICD-10-CM | POA: Diagnosis not present

## 2018-10-19 DIAGNOSIS — I110 Hypertensive heart disease with heart failure: Secondary | ICD-10-CM | POA: Diagnosis not present

## 2018-10-19 DIAGNOSIS — I4819 Other persistent atrial fibrillation: Secondary | ICD-10-CM | POA: Diagnosis not present

## 2018-10-19 DIAGNOSIS — G4733 Obstructive sleep apnea (adult) (pediatric): Secondary | ICD-10-CM | POA: Diagnosis not present

## 2018-10-19 DIAGNOSIS — Z800 Family history of malignant neoplasm of digestive organs: Secondary | ICD-10-CM | POA: Insufficient documentation

## 2018-10-19 DIAGNOSIS — Z87891 Personal history of nicotine dependence: Secondary | ICD-10-CM | POA: Diagnosis not present

## 2018-10-19 DIAGNOSIS — I480 Paroxysmal atrial fibrillation: Secondary | ICD-10-CM

## 2018-10-19 DIAGNOSIS — Z7901 Long term (current) use of anticoagulants: Secondary | ICD-10-CM | POA: Diagnosis not present

## 2018-10-19 DIAGNOSIS — Z86718 Personal history of other venous thrombosis and embolism: Secondary | ICD-10-CM | POA: Diagnosis not present

## 2018-10-19 DIAGNOSIS — Z8249 Family history of ischemic heart disease and other diseases of the circulatory system: Secondary | ICD-10-CM | POA: Diagnosis not present

## 2018-10-19 DIAGNOSIS — M109 Gout, unspecified: Secondary | ICD-10-CM | POA: Insufficient documentation
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
## 2018-10-25 NOTE — Progress Notes (Signed)
Chart and office note reviewed in detail  > agree with a/p as outlined    

## 2018-11-03 ENCOUNTER — Other Ambulatory Visit: Payer: Self-pay

## 2018-11-03 ENCOUNTER — Ambulatory Visit (HOSPITAL_COMMUNITY)
Admission: RE | Admit: 2018-11-03 | Discharge: 2018-11-03 | Disposition: A | Discharge status: Home / Self Care | Payer: Managed Care, Other (non HMO) | Source: Ambulatory Visit | Attending: Physician Assistant | Admitting: Physician Assistant

## 2018-11-03 VITALS — BP 162/76 | HR 79

## 2018-11-03 DIAGNOSIS — I480 Paroxysmal atrial fibrillation: Secondary | ICD-10-CM | POA: Diagnosis not present

## 2018-11-03 NOTE — Progress Notes (Addendum)
Pt in for EKG visit. Pt feeling well. EKG to be reviewed by Malka So, PA  ECG today shows SR HR 79, LAFB, NST, PR 172, QRS 96, QTc 479. Patient has felt well with no symptoms of heart racing or palpitations. Follow up with Dr Haroldine Laws as scheduled. AF clinic in 3 months.

## 2018-11-12 ENCOUNTER — Ambulatory Visit: Payer: Managed Care, Other (non HMO) | Admitting: Internal Medicine

## 2018-11-17 ENCOUNTER — Encounter (HOSPITAL_COMMUNITY): Payer: Managed Care, Other (non HMO) | Admitting: Internal Medicine

## 2018-11-19 ENCOUNTER — Other Ambulatory Visit: Payer: Self-pay

## 2018-11-19 ENCOUNTER — Ambulatory Visit (HOSPITAL_COMMUNITY)
Admission: RE | Admit: 2018-11-19 | Discharge: 2018-11-19 | Disposition: A | Discharge status: Home / Self Care | Payer: Managed Care, Other (non HMO) | Source: Ambulatory Visit | Attending: Internal Medicine | Admitting: Internal Medicine

## 2018-11-19 VITALS — BP 156/62 | HR 72 | Wt 376.1 lb

## 2018-11-19 DIAGNOSIS — M109 Gout, unspecified: Secondary | ICD-10-CM | POA: Insufficient documentation

## 2018-11-19 DIAGNOSIS — Z79899 Other long term (current) drug therapy: Secondary | ICD-10-CM | POA: Insufficient documentation

## 2018-11-19 DIAGNOSIS — G4733 Obstructive sleep apnea (adult) (pediatric): Secondary | ICD-10-CM | POA: Diagnosis not present

## 2018-11-19 DIAGNOSIS — N183 Chronic kidney disease, stage 3 unspecified: Secondary | ICD-10-CM

## 2018-11-19 DIAGNOSIS — I5032 Chronic diastolic (congestive) heart failure: Secondary | ICD-10-CM | POA: Diagnosis not present

## 2018-11-19 DIAGNOSIS — I5042 Chronic combined systolic (congestive) and diastolic (congestive) heart failure: Secondary | ICD-10-CM | POA: Diagnosis present

## 2018-11-19 DIAGNOSIS — Z8249 Family history of ischemic heart disease and other diseases of the circulatory system: Secondary | ICD-10-CM | POA: Diagnosis not present

## 2018-11-19 DIAGNOSIS — Z87891 Personal history of nicotine dependence: Secondary | ICD-10-CM | POA: Insufficient documentation

## 2018-11-19 DIAGNOSIS — Z6841 Body Mass Index (BMI) 40.0 and over, adult: Secondary | ICD-10-CM | POA: Diagnosis not present

## 2018-11-19 DIAGNOSIS — Z91018 Allergy to other foods: Secondary | ICD-10-CM | POA: Insufficient documentation

## 2018-11-19 DIAGNOSIS — N184 Chronic kidney disease, stage 4 (severe): Secondary | ICD-10-CM | POA: Insufficient documentation

## 2018-11-19 DIAGNOSIS — Z800 Family history of malignant neoplasm of digestive organs: Secondary | ICD-10-CM | POA: Insufficient documentation

## 2018-11-19 DIAGNOSIS — Z86718 Personal history of other venous thrombosis and embolism: Secondary | ICD-10-CM | POA: Diagnosis not present

## 2018-11-19 DIAGNOSIS — J449 Chronic obstructive pulmonary disease, unspecified: Secondary | ICD-10-CM | POA: Insufficient documentation

## 2018-11-19 DIAGNOSIS — Z86711 Personal history of pulmonary embolism: Secondary | ICD-10-CM | POA: Insufficient documentation

## 2018-11-19 DIAGNOSIS — I10 Essential (primary) hypertension: Secondary | ICD-10-CM | POA: Diagnosis not present

## 2018-11-19 DIAGNOSIS — I4892 Unspecified atrial flutter: Secondary | ICD-10-CM | POA: Insufficient documentation

## 2018-11-19 DIAGNOSIS — Z833 Family history of diabetes mellitus: Secondary | ICD-10-CM | POA: Insufficient documentation

## 2018-11-19 DIAGNOSIS — I480 Paroxysmal atrial fibrillation: Secondary | ICD-10-CM | POA: Insufficient documentation

## 2018-11-19 DIAGNOSIS — I130 Hypertensive heart and chronic kidney disease with heart failure and stage 1 through stage 4 chronic kidney disease, or unspecified chronic kidney disease: Secondary | ICD-10-CM | POA: Diagnosis present

## 2018-11-19 DIAGNOSIS — Z7901 Long term (current) use of anticoagulants: Secondary | ICD-10-CM | POA: Insufficient documentation

## 2018-11-19 LAB — BASIC METABOLIC PANEL
Anion gap: 9 (ref 5–15)
BUN: 26 mg/dL — ABNORMAL HIGH (ref 6–20)
CO2: 28 mmol/L (ref 22–32)
Calcium: 8.9 mg/dL (ref 8.9–10.3)
Chloride: 104 mmol/L (ref 98–111)
Creatinine, Ser: 2.02 mg/dL — ABNORMAL HIGH (ref 0.61–1.24)
GFR calc Af Amer: 41 mL/min — ABNORMAL LOW (ref >60)
GFR calc non Af Amer: 36 mL/min — ABNORMAL LOW (ref >60)
Glucose, Bld: 106 mg/dL — ABNORMAL HIGH (ref 70–99)
Potassium: 3.6 mmol/L (ref 3.5–5.1)
Sodium: 141 mmol/L (ref 135–145)

## 2018-11-19 LAB — BRAIN NATRIURETIC PEPTIDE: B Natriuretic Peptide: 456.5 pg/mL — ABNORMAL HIGH (ref 0.0–100.0)

## 2018-11-19 NOTE — Patient Instructions (Signed)
It was great to see you today! No medication changes are needed at this time.  Labs today We will only contact you if something comes back abnormal or we need to make some changes. Otherwise no news is good news!  Your physician recommends that you schedule a follow-up appointment in: 4 months with Dr Haroldine Laws  Do the following things EVERYDAY: 1) Weigh yourself in the morning before breakfast. Write it down and keep it in a log. 2) Take your medicines as prescribed 3) Eat low salt foods-Limit salt (sodium) to 2000 mg per day.  4) Stay as active as you can everyday 5) Limit all fluids for the day to less than 2 liters At the [facility], you and your health needs are our priority. As part of our continuing mission to provide you with exceptional heart care, we have created designated Provider Care Teams. These Care Teams include your primary Cardiologist (physician) and Advanced Practice Providers (APPs- Physician Assistants and Nurse Practitioners) who all work together to provide you with the care you need, when you need it.   You may see any of the following providers on your designated Care Team at your next follow up: [name] Dr Glori Bickers . Dr Loralie Champagne . Darrick Grinder, NP   Please be sure to bring in all your medications bottles to every appointment.

## 2018-11-21 NOTE — Progress Notes (Signed)
Advanced Heart Failure Clinic Note   Referring Physician: [name] Primary Care: Silvio Pate Primary Cardiologist: [name]  EP: Dr Rayann Heman HF MD: Dr Haroldine Laws   HPI: Phillip [name] is a 58 y.o. male hx of PAF, remote PE, hx of DVT, OSA, morbid obesity, CKD Stage III,  HTN, and  combined chronic systolic/diastolic. S/P A Flutter ablation May 2019 .    EF in 2015 40-45% Echo 12/21/16 with EF 55-60% G2DD, mild aortic regurgitation. Last nuc 2008 no ischemia, no hx of cath due to CKD.   Developed AFL and underwent AFL ablation in 5/19.  I saw him last on 10/11/18 and was feeling bad. More SOB. Having some pressure in his chest and arm. + LE edema. Found to be in AFL/AF in 80s. SOB with any activity. Echo at that time with EF back down to 30%. We scheduled him for DC-CV and referred to AF Clinic.   Underwent DC-CV on 10/14/18. Returns today for f/u. Says he is feeling much better. Back to baseline. Able to do all his activities without difficulty. No CP or SOB. Edema resolved. Wearing his CPAP every night.   Review of systems complete and found to be negative unless listed in HPI.    Past Medical History:  Diagnosis Date  . CHF (congestive heart failure) ([facility] [place]) 11/15   EF 40-45%, improved with follow-up  . COPD (chronic obstructive pulmonary disease) ([place])   . Gout   . History of DVT (deep vein thrombosis)   . Hypertension   . Hypertensive cardiovascular disease   . Kidney dysfunction   . Morbid obesity ([place])   . OSA on CPAP   . Paroxysmal atrial fibrillation ([facility])   . PNA (pneumonia)   . Pulmonary embolism ([place]) 2010   left leg DVT   Current Outpatient Medications  Medication Sig Dispense Refill  . albuterol (PROAIR HFA) 108 (90 Base) MCG/ACT inhaler Inhale 2 puffs into the lungs every 4 (four) hours as needed for wheezing or shortness of breath. 1 Inhaler 3  . albuterol (PROVENTIL) (2.5 MG/3ML) 0.083% nebulizer solution Take 3 mLs (2.5 mg total) by nebulization every 6 (six) hours as needed  for wheezing or shortness of breath. 360 mL 11  . allopurinol (ZYLOPRIM) 300 MG tablet TAKE 1 TABLET (300 MG TOTAL) DAILY BY MOUTH. 30 tablet 11  . budesonide-formoterol (SYMBICORT) 80-4.5 MCG/ACT inhaler USE 2 INHALATIONS TWICE A DAY (NEED APPOINTMENT) (Patient taking differently: Inhale 2 puffs into the lungs 2 (two) times a day. ) 30.6 g 3  . carvedilol (COREG) 6.25 MG tablet TAKE 1 TABLET TWICE A DAY WITH MEALS 180 tablet 3  . cloNIDine (CATAPRES) 0.1 MG tablet TAKE 1 TABLET BY MOUTH THREE TIMES A DAY 90 tablet 3  . colchicine 0.6 MG tablet Take 1 tablet (0.6 mg total) by mouth daily as needed (gout). 30 tablet 1  . famotidine (PEPCID) 20 MG tablet One at bedtime (Patient taking differently: Take 20 mg by mouth at bedtime. ) 30 tablet 11  . hydrALAZINE (APRESOLINE) 50 MG tablet TAKE ONE AND ONE-HALF TABLETS THREE TIMES A DAY 135 tablet 11  . montelukast (SINGULAIR) 10 MG tablet TAKE 1 TABLET BY MOUTH EVERYDAY AT BEDTIME (Patient taking differently: Take 10 mg by mouth at bedtime. ) 30 tablet 5  . naproxen sodium (ALEVE) 220 MG tablet Take 440 mg by mouth 2 (two) times daily as needed (pain).    [name] oxymetazoline (AFRIN NASAL SPRAY) 0.05 % nasal spray Place 1 spray into both nostrils 2 (two)  times daily as needed for congestion.    . pantoprazole (PROTONIX) 40 MG tablet Take 30- 60 min before your first and last meals of the day 60 tablet 5  . potassium chloride SA (KLOR-CON M20) 20 MEQ tablet TAKE 2 TABLETS DAILY 180 tablet 3  . torsemide (DEMADEX) 20 MG tablet Take 3 tablets (60 mg total) by mouth 2 (two) times daily. 540 tablet 3  . UNABLE TO FIND Med Name: CPAP with sleep    . XARELTO 20 MG TABS tablet TAKE 1 TABLET DAILY WITH SUPPER 90 tablet 4   No current facility-administered medications for this encounter.    Allergies  Allergen Reactions  . Other Anaphylaxis    mushrooms    Social History   Socioeconomic History  . Marital status: Divorced    Spouse name: Not on file  .  Number of children: 2  . Years of education: 14  . Highest education level: Not on file  Occupational History  . Occupation: Drives dump truck    Comment: Part time  . Occupation: Airline pilot    Comment: Disabled  Social Needs  . Financial resource strain: Not hard at all  . Food insecurity    Worry: Never true    Inability: Never true  . Transportation needs    Medical: No    Non-medical: No  Tobacco Use  . Smoking status: Former Smoker    Packs/day: 0.25    Years: 1.00    Pack years: 0.25    Types: Cigarettes    Quit date: 07/23/1989    Years since quitting: 29.3  . Smokeless tobacco: Never Used  Substance and Sexual Activity  . Alcohol use: No    Alcohol/week: 0.0 standard drinks  . Drug use: No  . Sexual activity: Not Currently  Lifestyle  . Physical activity    Days per week: Not on file    Minutes per session: Not on file  . Stress: Not on file  Relationships  . Social Herbalist on phone: Not on file    Gets together: Not on file    Attends religious service: Not on file    Active member of club or organization: Not on file    Attends meetings of clubs or organizations: Not on file    Relationship status: Not on file  . Intimate partner violence    Fear of current or ex partner: Not on file    Emotionally abused: Not on file    Physically abused: Not on file    Forced sexual activity: Not on file  Other Topics Concern  . Not on file  Social History Narrative   Pt lives in [place], alone.   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   Has 2 sons in [place]      No living will   Would want sons to make decisions for him   Would accept resuscitation    Family History  Problem Relation Age of Onset  . Hypertension Mother   . Diabetes Father   . CAD Father   . Stomach cancer Father   . Clotting disorder Father   . Heart disease Father        transplant  . Allergies Brother    Vitals:   11/19/18 1004  BP:  (!) 156/62  Pulse: 72  SpO2: 95%  Weight: (!) 170.6 kg (376 lb 2 oz)   Wt Readings from Last 3 Encounters:  11/19/18 (!) 170.6 kg (376 lb 2 oz)  10/19/18 (!) 171.5 kg (378 lb)  10/14/18 (!) 168.3 kg (371 lb)   PHYSICAL EXAM: General:  Obese male. No resp difficulty HEENT: normal Neck: supple. no obvious JVD. Carotids 2+ bilat; no bruits. No lymphadenopathy or thryomegaly appreciated. Cor: PMI nondisplaced. Regular rate & rhythm. No rubs, gallops or murmurs. Lungs: clear Abdomen: obese soft, nontender, nondistended. No hepatosplenomegaly. No bruits or masses. Good bowel sounds. Extremities: no cyanosis, clubbing, rash, edema Neuro: alert & orientedx3, cranial nerves grossly intact. moves all 4 extremities w/o difficulty. Affect pleasant   ECG NSR 64 Diffuse non-specific TWI Personally reviewed   ASSESSMENT & PLAN:  1. Chronic systolic/diastolic HF - Echo 10/6752 LVEF 55-60%, Grade 2 DD. - EF 7/20 back down to 30%. Suspect due to AF but rate not overly elevated. HTN may also be playing a role. - Symptoms now improved after DC-CV on 10/11/18  - NYHA I-II - Will need repeat echo in 2-3 months - Volume status looks good - Continue torsemide 60 bid for now. - Continue hydralazine 75 tid and carvedilol 6.25 bid. IF EF remains down will need Imdur. -  If EF doesn't improve with restoration of NSR and control of HTN he will need cath (renal function permitting) - No ACE/ARB with CKD - Ideally will need to stop clonidine with low EF but will wait to see if EF recovers with DC-CV  2. PAF / Typical A-flutter, paroxysmal - S/P AFL ablation 07/2017. - s/p DC-CV of AFib on 10/14/18 - Continue Xarelto 20 mg daily.  - No bleeding issues.   - He is compliant with CPAP. Needs weight loss - Has been seen in AF Clinic and felt not to be candidate for RFA. If AF recurs will need AA  3. Hypertension - BP high here but says it is well controlled at home. Will need to follow closely - May need  Paramedicine to assure compliance  6. Morbid obesity - Body mass index is 55.54 kg/m.  - Desperately needs weight loss  5. CKD IV - Creatinine baseline ~2.0-2.3 - Check BMET today  6. OSA on CPAP - reports good compliance  See back in 2-3 months with echo   Glori Bickers, MD  2:36 PM

## 2018-11-23 NOTE — Progress Notes (Signed)
Bensimhon, Shaune Pascal, MD sent to Scarlette Calico, RN        Nira Conn - can you get an echo on him when he returns for f/u? thanks    Order for echo placed, message sent to schedulers to sch

## 2018-11-23 NOTE — Addendum Note (Signed)
Encounter addended by: Scarlette Calico, RN on: 11/23/2018 9:07 AM  Actions taken: Clinical Note Signed, Order list changed, Diagnosis association updated

## 2018-12-13 ENCOUNTER — Other Ambulatory Visit: Payer: Self-pay | Admitting: Internal Medicine

## 2018-12-21 ENCOUNTER — Telehealth (HOSPITAL_COMMUNITY): Payer: Self-pay | Admitting: *Deleted

## 2018-12-21 NOTE — Telephone Encounter (Signed)
Patient called in stating he has noticed several episodes of breakthrough afib. The episodes within the last week have increased. Episodes are not lasting but 10-15 mins - he definitely feels poorly with the episodes. Offered to bring pt in for appt to discuss AAD but pt states he will see how the rest of the week goes if episodes continue he will call back in for appt. Pt did say his weight was up 2lbs today so he did take extra diuretic this afternoon.

## 2019-01-10 ENCOUNTER — Other Ambulatory Visit: Payer: Self-pay | Admitting: Internal Medicine

## 2019-01-13 ENCOUNTER — Other Ambulatory Visit: Payer: Self-pay | Admitting: Internal Medicine

## 2019-01-13 NOTE — Telephone Encounter (Signed)
Needs CPE scheduled for after 11/15 please when able.

## 2019-01-13 NOTE — Telephone Encounter (Signed)
Patient scheduled next available appointment on 03/08/19.

## 2019-01-18 ENCOUNTER — Other Ambulatory Visit (HOSPITAL_COMMUNITY): Payer: Self-pay | Admitting: Cardiology

## 2019-01-19 ENCOUNTER — Telehealth: Payer: Self-pay | Admitting: Internal Medicine

## 2019-01-19 NOTE — Telephone Encounter (Signed)
Spoke to pt. Advised him the Dr Caryl Never had sent a rx to [facility] in May with additional refills. Advise dhim to reach out to [facility] about that refill and that I would have to ask  Dr Silvio Pate about the prescription for him, but it would be best to ask Dr Haroldine Laws do the short term rx.

## 2019-01-19 NOTE — Telephone Encounter (Signed)
Pt called needing to get a refill on hydralazine.  He stated his pharmacy said he could not get any refill until he see dr Silvio Pate.  Pt has cpx appointment 12/15 can he get a refill till then  [facility] and [facility] until he can get some from his mail order

## 2019-01-20 ENCOUNTER — Other Ambulatory Visit (HOSPITAL_COMMUNITY): Payer: Self-pay | Admitting: *Deleted

## 2019-01-20 MED ORDER — HYDRALAZINE HCL 50 MG PO TABS: ORAL_TABLET | ORAL | 11 refills | Status: DC

## 2019-01-20 MED ORDER — HYDRALAZINE HCL 50 MG PO TABS: ORAL_TABLET | ORAL | 3 refills | Status: DC

## 2019-01-25 ENCOUNTER — Ambulatory Visit (HOSPITAL_COMMUNITY)
Admission: RE | Admit: 2019-01-25 | Discharge: 2019-01-25 | Disposition: A | Discharge status: Home / Self Care | Payer: Managed Care, Other (non HMO) | Source: Ambulatory Visit | Attending: Nurse Practitioner | Admitting: Nurse Practitioner

## 2019-01-25 ENCOUNTER — Encounter (HOSPITAL_COMMUNITY): Payer: Self-pay | Admitting: Nurse Practitioner

## 2019-01-25 ENCOUNTER — Other Ambulatory Visit: Payer: Self-pay

## 2019-01-25 VITALS — BP 158/68 | HR 75 | Ht 69.0 in | Wt 382.4 lb

## 2019-01-25 DIAGNOSIS — Z79899 Other long term (current) drug therapy: Secondary | ICD-10-CM | POA: Diagnosis not present

## 2019-01-25 DIAGNOSIS — Z8249 Family history of ischemic heart disease and other diseases of the circulatory system: Secondary | ICD-10-CM | POA: Insufficient documentation

## 2019-01-25 DIAGNOSIS — G4733 Obstructive sleep apnea (adult) (pediatric): Secondary | ICD-10-CM | POA: Insufficient documentation

## 2019-01-25 DIAGNOSIS — Z87891 Personal history of nicotine dependence: Secondary | ICD-10-CM | POA: Diagnosis not present

## 2019-01-25 DIAGNOSIS — I110 Hypertensive heart disease with heart failure: Secondary | ICD-10-CM | POA: Insufficient documentation

## 2019-01-25 DIAGNOSIS — I509 Heart failure, unspecified: Secondary | ICD-10-CM | POA: Insufficient documentation

## 2019-01-25 DIAGNOSIS — Z7901 Long term (current) use of anticoagulants: Secondary | ICD-10-CM | POA: Diagnosis not present

## 2019-01-25 DIAGNOSIS — I5032 Chronic diastolic (congestive) heart failure: Secondary | ICD-10-CM

## 2019-01-25 DIAGNOSIS — J449 Chronic obstructive pulmonary disease, unspecified: Secondary | ICD-10-CM | POA: Diagnosis not present

## 2019-01-25 DIAGNOSIS — Z86711 Personal history of pulmonary embolism: Secondary | ICD-10-CM | POA: Diagnosis not present

## 2019-01-25 DIAGNOSIS — Z86718 Personal history of other venous thrombosis and embolism: Secondary | ICD-10-CM | POA: Diagnosis not present

## 2019-01-25 DIAGNOSIS — I480 Paroxysmal atrial fibrillation: Secondary | ICD-10-CM | POA: Insufficient documentation

## 2019-01-25 LAB — CBC
HCT: 39.4 % (ref 39.0–52.0)
Hemoglobin: 11.9 g/dL — ABNORMAL LOW (ref 13.0–17.0)
MCH: 28.5 pg (ref 26.0–34.0)
MCHC: 30.2 g/dL (ref 30.0–36.0)
MCV: 94.3 fL (ref 80.0–100.0)
Platelets: 158 10*3/uL (ref 150–400)
RBC: 4.18 MIL/uL — ABNORMAL LOW (ref 4.22–5.81)
RDW: 14.7 % (ref 11.5–15.5)
WBC: 4.3 10*3/uL (ref 4.0–10.5)
nRBC: 0 % (ref 0.0–0.2)

## 2019-01-25 LAB — BASIC METABOLIC PANEL
Anion gap: 10 (ref 5–15)
BUN: 18 mg/dL (ref 6–20)
CO2: 29 mmol/L (ref 22–32)
Calcium: 9 mg/dL (ref 8.9–10.3)
Chloride: 104 mmol/L (ref 98–111)
Creatinine, Ser: 2.07 mg/dL — ABNORMAL HIGH (ref 0.61–1.24)
GFR calc Af Amer: 40 mL/min — ABNORMAL LOW (ref >60)
GFR calc non Af Amer: 35 mL/min — ABNORMAL LOW (ref >60)
Glucose, Bld: 131 mg/dL — ABNORMAL HIGH (ref 70–99)
Potassium: 3.5 mmol/L (ref 3.5–5.1)
Sodium: 143 mmol/L (ref 135–145)

## 2019-01-25 NOTE — Patient Instructions (Signed)
Increase Torsemide 80mg  twice day for three days and increase Potassium to three times a day then decrease Torsemide and Potassium to original instructions.
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
## 2019-01-26 ENCOUNTER — Ambulatory Visit (HOSPITAL_COMMUNITY)
Admission: RE | Admit: 2019-01-26 | Discharge: 2019-01-26 | Disposition: A | Discharge status: Home / Self Care | Payer: Managed Care, Other (non HMO) | Source: Ambulatory Visit | Attending: Internal Medicine | Admitting: Internal Medicine

## 2019-01-26 DIAGNOSIS — I4891 Unspecified atrial fibrillation: Secondary | ICD-10-CM | POA: Diagnosis not present

## 2019-01-26 DIAGNOSIS — I5032 Chronic diastolic (congestive) heart failure: Secondary | ICD-10-CM | POA: Diagnosis present

## 2019-01-26 DIAGNOSIS — I110 Hypertensive heart disease with heart failure: Secondary | ICD-10-CM | POA: Insufficient documentation

## 2019-01-26 DIAGNOSIS — I080 Rheumatic disorders of both mitral and aortic valves: Secondary | ICD-10-CM | POA: Insufficient documentation

## 2019-01-26 DIAGNOSIS — Z86711 Personal history of pulmonary embolism: Secondary | ICD-10-CM | POA: Diagnosis not present

## 2019-01-26 NOTE — Progress Notes (Signed)
  Echocardiogram 2D Echocardiogram has been performed.  Bobbye Charleston 01/26/2019, 10:04 AM

## 2019-01-27 ENCOUNTER — Telehealth (HOSPITAL_COMMUNITY): Payer: Self-pay | Admitting: Cardiology

## 2019-01-27 ENCOUNTER — Ambulatory Visit (HOSPITAL_COMMUNITY)
Admission: RE | Admit: 2019-01-27 | Discharge: 2019-01-27 | Disposition: A | Discharge status: Home / Self Care | Payer: Managed Care, Other (non HMO) | Source: Ambulatory Visit | Attending: Internal Medicine | Admitting: Internal Medicine

## 2019-01-27 ENCOUNTER — Other Ambulatory Visit: Payer: Self-pay

## 2019-01-27 VITALS — BP 157/68 | HR 84 | Wt 374.8 lb

## 2019-01-27 DIAGNOSIS — I480 Paroxysmal atrial fibrillation: Secondary | ICD-10-CM | POA: Diagnosis not present

## 2019-01-27 DIAGNOSIS — Z833 Family history of diabetes mellitus: Secondary | ICD-10-CM | POA: Insufficient documentation

## 2019-01-27 DIAGNOSIS — Z79899 Other long term (current) drug therapy: Secondary | ICD-10-CM | POA: Diagnosis not present

## 2019-01-27 DIAGNOSIS — Z87891 Personal history of nicotine dependence: Secondary | ICD-10-CM | POA: Diagnosis not present

## 2019-01-27 DIAGNOSIS — I10 Essential (primary) hypertension: Secondary | ICD-10-CM | POA: Diagnosis not present

## 2019-01-27 DIAGNOSIS — Z6841 Body Mass Index (BMI) 40.0 and over, adult: Secondary | ICD-10-CM | POA: Insufficient documentation

## 2019-01-27 DIAGNOSIS — I5032 Chronic diastolic (congestive) heart failure: Secondary | ICD-10-CM

## 2019-01-27 DIAGNOSIS — M109 Gout, unspecified: Secondary | ICD-10-CM | POA: Diagnosis not present

## 2019-01-27 DIAGNOSIS — Z7951 Long term (current) use of inhaled steroids: Secondary | ICD-10-CM | POA: Diagnosis not present

## 2019-01-27 DIAGNOSIS — G4733 Obstructive sleep apnea (adult) (pediatric): Secondary | ICD-10-CM | POA: Diagnosis not present

## 2019-01-27 DIAGNOSIS — J449 Chronic obstructive pulmonary disease, unspecified: Secondary | ICD-10-CM | POA: Insufficient documentation

## 2019-01-27 DIAGNOSIS — I130 Hypertensive heart and chronic kidney disease with heart failure and stage 1 through stage 4 chronic kidney disease, or unspecified chronic kidney disease: Secondary | ICD-10-CM | POA: Diagnosis not present

## 2019-01-27 DIAGNOSIS — Z800 Family history of malignant neoplasm of digestive organs: Secondary | ICD-10-CM | POA: Diagnosis not present

## 2019-01-27 DIAGNOSIS — Z86711 Personal history of pulmonary embolism: Secondary | ICD-10-CM | POA: Diagnosis not present

## 2019-01-27 DIAGNOSIS — Z86718 Personal history of other venous thrombosis and embolism: Secondary | ICD-10-CM | POA: Diagnosis not present

## 2019-01-27 DIAGNOSIS — Z8249 Family history of ischemic heart disease and other diseases of the circulatory system: Secondary | ICD-10-CM | POA: Diagnosis not present

## 2019-01-27 DIAGNOSIS — I5042 Chronic combined systolic (congestive) and diastolic (congestive) heart failure: Secondary | ICD-10-CM | POA: Diagnosis not present

## 2019-01-27 DIAGNOSIS — N183 Chronic kidney disease, stage 3 unspecified: Secondary | ICD-10-CM

## 2019-01-27 DIAGNOSIS — E876 Hypokalemia: Secondary | ICD-10-CM

## 2019-01-27 DIAGNOSIS — N184 Chronic kidney disease, stage 4 (severe): Secondary | ICD-10-CM | POA: Insufficient documentation

## 2019-01-27 DIAGNOSIS — I484 Atypical atrial flutter: Secondary | ICD-10-CM | POA: Diagnosis not present

## 2019-01-27 DIAGNOSIS — Z7901 Long term (current) use of anticoagulants: Secondary | ICD-10-CM | POA: Diagnosis not present

## 2019-01-27 LAB — BASIC METABOLIC PANEL
Anion gap: 8 (ref 5–15)
BUN: 20 mg/dL (ref 6–20)
CO2: 29 mmol/L (ref 22–32)
Calcium: 9 mg/dL (ref 8.9–10.3)
Chloride: 106 mmol/L (ref 98–111)
Creatinine, Ser: 1.94 mg/dL — ABNORMAL HIGH (ref 0.61–1.24)
GFR calc Af Amer: 44 mL/min — ABNORMAL LOW (ref >60)
GFR calc non Af Amer: 38 mL/min — ABNORMAL LOW (ref >60)
Glucose, Bld: 97 mg/dL (ref 70–99)
Potassium: 3.4 mmol/L — ABNORMAL LOW (ref 3.5–5.1)
Sodium: 143 mmol/L (ref 135–145)

## 2019-01-27 MED ORDER — POTASSIUM CHLORIDE CRYS ER 20 MEQ PO TBCR: EXTENDED_RELEASE_TABLET | ORAL | 3 refills | Status: DC

## 2019-01-27 MED ORDER — HYDRALAZINE HCL 50 MG PO TABS: ORAL_TABLET | ORAL | 3 refills | Status: DC

## 2019-01-27 NOTE — Progress Notes (Signed)
Advanced Heart Failure Clinic Note   Referring Physician: [name] Primary Care: Silvio Pate Primary Cardiologist: [name]  EP: Dr Rayann Heman HF MD: Dr Haroldine Laws   HPI: Phillip [name] is a 58 y.o. male hx of PAF, remote PE, hx of DVT, OSA, morbid obesity, CKD Stage III,  HTN, and  combined chronic systolic/diastolic. S/P A Flutter ablation May 2019 .    Developed AFL and underwent AFL ablation in 5/19.  He was seen  10/11/18 and was feeling bad. More SOB. Having some pressure in his chest and arm. + LE edema. Found to be in AFL/AF in 80s. SOB with any activity. Echo at that time with EF back down to 30%. We scheduled him for DC-CV and referred to AF Clinic. Underwent DC-CV on 10/14/18 with restoration of NSR.   On 01/25/19 he was seen in the A fib clinic and was complaining of increased shortness of breath. Discussed with HF team and he was instructed to increase torsemide to 80 mg twice a day x3 days then go back to torsemide 60 mg twice a day.   Today he returns for HF follow up.Overall feeling much better. Mild SOB with steps. Denies PND/Orthopnea. Using CPAP every night. No bleeding issues.  Appetite ok. No fever or chills. Weight at home 376--->372 pounds. Taking all medications.   EF in 2015 40-45% Echo 12/21/16 with EF 55-60% G2DD, mild aortic regurgitation. Last nuc 2008 no ischemia, no hx of cath due to CKD ECHO 10/11/18 EF 25-30%  ECHO 2020 EF 45-50% RV normal. Mildly increased LVH.  Review of systems complete and found to be negative unless listed in HPI.    Past Medical History:  Diagnosis Date  . CHF (congestive heart failure) ([place]) 11/15   EF 40-45%, improved with follow-up  . COPD (chronic obstructive pulmonary disease) ([place])   . Gout   . History of DVT (deep vein thrombosis)   . Hypertension   . Hypertensive cardiovascular disease   . Kidney dysfunction   . Morbid obesity ([place])   . OSA on CPAP   . Paroxysmal atrial fibrillation ([facility])   . PNA (pneumonia)   . Pulmonary embolism  ([place]) 2010   left leg DVT   Current Outpatient Medications  Medication Sig Dispense Refill  . albuterol (PROAIR HFA) 108 (90 Base) MCG/ACT inhaler Inhale 2 puffs into the lungs every 4 (four) hours as needed for wheezing or shortness of breath. 1 Inhaler 3  . albuterol (PROVENTIL) (2.5 MG/3ML) 0.083% nebulizer solution Take 3 mLs (2.5 mg total) by nebulization every 6 (six) hours as needed for wheezing or shortness of breath. 360 mL 11  . allopurinol (ZYLOPRIM) 300 MG tablet TAKE 1 TABLET DAILY BY MOUTH. 30 tablet 11  . budesonide-formoterol (SYMBICORT) 80-4.5 MCG/ACT inhaler USE 2 INHALATIONS TWICE A DAY (NEED APPOINTMENT) (Patient taking differently: Inhale 2 puffs into the lungs 2 (two) times a day. ) 30.6 g 3  . carvedilol (COREG) 6.25 MG tablet TAKE 1 TABLET TWICE A DAY WITH MEALS 180 tablet 3  . cloNIDine (CATAPRES) 0.1 MG tablet TAKE 1 TABLET BY MOUTH THREE TIMES A DAY 90 tablet 0  . colchicine 0.6 MG tablet Take 1 tablet (0.6 mg total) by mouth daily as needed (gout). 30 tablet 1  . hydrALAZINE (APRESOLINE) 50 MG tablet TAKE ONE AND ONE-HALF TABLETS THREE TIMES A DAY 405 tablet 3  . montelukast (SINGULAIR) 10 MG tablet TAKE 1 TABLET BY MOUTH EVERYDAY AT BEDTIME (Patient taking differently: Take 10 mg by mouth at bedtime. )  30 tablet 5  . naproxen sodium (ALEVE) 220 MG tablet Take 440 mg by mouth 2 (two) times daily as needed (pain).    [name] oxymetazoline (AFRIN NASAL SPRAY) 0.05 % nasal spray Place 1 spray into both nostrils 2 (two) times daily as needed for congestion.    . pantoprazole (PROTONIX) 40 MG tablet Take 30- 60 min before your first and last meals of the day 60 tablet 5  . potassium chloride SA (KLOR-CON M20) 20 MEQ tablet TAKE 2 TABLETS DAILY 180 tablet 3  . torsemide (DEMADEX) 20 MG tablet TAKE 3 TABLETS TWICE A DAY 540 tablet 3  . UNABLE TO FIND Med Name: CPAP with sleep    . XARELTO 20 MG TABS tablet TAKE 1 TABLET DAILY WITH SUPPER 90 tablet 4   No current  facility-administered medications for this encounter.    Allergies  Allergen Reactions  . Other Anaphylaxis    mushrooms    Social History   Socioeconomic History  . Marital status: Divorced    Spouse name: Not on file  . Number of children: 2  . Years of education: 26  . Highest education level: Not on file  Occupational History  . Occupation: Drives dump truck    Comment: Part time  . Occupation: Airline pilot    Comment: Disabled  Social Needs  . Financial resource strain: Not hard at all  . Food insecurity    Worry: Never true    Inability: Never true  . Transportation needs    Medical: No    Non-medical: No  Tobacco Use  . Smoking status: Former Smoker    Packs/day: 0.25    Years: 1.00    Pack years: 0.25    Types: Cigarettes    Quit date: 07/23/1989    Years since quitting: 29.5  . Smokeless tobacco: Never Used  Substance and Sexual Activity  . Alcohol use: No    Alcohol/week: 0.0 standard drinks  . Drug use: No  . Sexual activity: Not Currently  Lifestyle  . Physical activity    Days per week: Not on file    Minutes per session: Not on file  . Stress: Not on file  Relationships  . Social Herbalist on phone: Not on file    Gets together: Not on file    Attends religious service: Not on file    Active member of club or organization: Not on file    Attends meetings of clubs or organizations: Not on file    Relationship status: Not on file  . Intimate partner violence    Fear of current or ex partner: Not on file    Emotionally abused: Not on file    Physically abused: Not on file    Forced sexual activity: Not on file  Other Topics Concern  . Not on file  Social History Narrative   Pt lives in [place], alone.   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   Has 2 sons in [place]      No living will   Would want sons to make decisions for him   Would accept resuscitation    Family History  Problem Relation  Age of Onset  . Hypertension Mother   . Diabetes Father   . CAD Father   . Stomach cancer Father   . Clotting disorder Father   . Heart disease Father        transplant  .  Allergies Brother    Vitals:   01/27/19 0851  BP: (!) 157/68  Pulse: 84  SpO2: 93%  Weight: (!) 170 kg   Wt Readings from Last 3 Encounters:  01/27/19 (!) 170 kg  01/25/19 (!) 173.5 kg  11/19/18 (!) 170.6 kg   PHYSICAL EXAM: General:  Well appearing. No resp difficulty HEENT: normal Neck: supple. no JVD. Carotids 2+ bilat; no bruits. No lymphadenopathy or thryomegaly appreciated. Cor: PMI nondisplaced. Regular rate & rhythm. No rubs, gallops or murmurs. Lungs: clear Abdomen: obese, soft, nontender, nondistended. No hepatosplenomegaly. No bruits or masses. Good bowel sounds. Extremities: no cyanosis, clubbing, rash, edema Neuro: alert & orientedx3, cranial nerves grossly intact. moves all 4 extremities w/o difficulty. Affect pleasant      ASSESSMENT & PLAN:  1. Chronic systolic/diastolic HF - Echo 08/2834 LVEF 55-60%, Grade 2 DD. - EF 7/20 back down to 30%. Suspect due to AF but rate not overly elevated. HTN may also be playing a role. - ECHO 01/25/19 -->EF has improved to 45-50% with mild LVH. I discussed ECHO results today.  - NYHA II-III.  Volume status much improved. Suspect it was relater  Continue higher dose of torsemide to 80 mg twice a day then back to 60 mg twice a day. Check BMET today.  - Continue hydralazine 75 tid and carvedilol 6.25 bid.  -  - No ACE/ARB with CKD  2. PAF / Typical A-flutter, paroxysmal - S/P AFL ablation 07/2017. - s/p DC-CV of AFib on 10/14/18 - Regular on exam.  - Continue Xarelto 20 mg daily.  - He is compliant with CPAP.  - Has been seen in AF Clinic and felt not to be candidate for RFA. If AF recurs will need AA  3. Hypertension -Elevated but he has not had his medications.  -Reinforced medication compliance.   6. Morbid obesity - Body mass index is 55.35  kg/m.  -Discussed portion control. Needs to lose a significant amount of weight.   5. CKD IV - Creatinine baseline ~2.0-2.3 - Check BMET today.   6. OSA on CPAP Continue CPAP every night.   Follow up 3-4 months with Dr Haroldine Laws. Greater than 50% of the (total minutes 25 ) visit spent in counseling/coordination of care regarding the above.    Darrick Grinder, NP  9:10 AM

## 2019-01-27 NOTE — Telephone Encounter (Signed)
-----   Message from Conrad Garfield, NP sent at 01/27/2019 11:49 AM EST ----- Renal function stable. Potassium low. . Increase K to 40 meq in am 20 meq in pm.

## 2019-01-27 NOTE — Telephone Encounter (Signed)
Notes recorded by Kerry Dory, CMA on 01/27/2019 at 4:28 PM EST  Patient aware. Patient voiced understanding   ------   Notes recorded by Conrad Galateo, NP on 01/27/2019 at 11:49 AM EST  Renal function stable. Potassium low. . Increase K to 40 meq in am 20 meq in pm.

## 2019-01-27 NOTE — Patient Instructions (Signed)
Labs today We will only contact you if something comes back abnormal or we need to make some changes. Otherwise no news is good news!  A refill for Hydralazine have been sent to your pharmacy.  Your physician recommends that you schedule a follow-up appointment in: 3 months with Dr Haroldine Laws on Monday Feburary 8th, 2021 at [place] code [phone]  At the [facility], you and your health needs are our priority. As part of our continuing mission to provide you with exceptional heart care, we have created designated Provider Care Teams. These Care Teams include your primary Cardiologist (physician) and Advanced Practice Providers (APPs- Physician Assistants and Nurse Practitioners) who all work together to provide you with the care you need, when you need it.   You may see any of the following providers on your designated Care Team at your next follow up: [name] Dr Glori Bickers . Dr Loralie Champagne . Darrick Grinder, NP . Lyda Jester, PA   Please be sure to bring in all your medications bottles to every appointment.

## 2019-02-01 ENCOUNTER — Other Ambulatory Visit: Payer: Self-pay | Admitting: Internal Medicine

## 2019-02-27 ENCOUNTER — Other Ambulatory Visit: Payer: Self-pay | Admitting: Internal Medicine

## 2019-03-08 ENCOUNTER — Other Ambulatory Visit: Payer: Self-pay

## 2019-03-08 ENCOUNTER — Ambulatory Visit (INDEPENDENT_AMBULATORY_CARE_PROVIDER_SITE_OTHER): Payer: Managed Care, Other (non HMO) | Admitting: Internal Medicine

## 2019-03-08 ENCOUNTER — Encounter: Payer: Self-pay | Admitting: Internal Medicine

## 2019-03-08 ENCOUNTER — Telehealth: Payer: Self-pay | Admitting: Internal Medicine

## 2019-03-08 DIAGNOSIS — Z0000 Encounter for general adult medical examination without abnormal findings: Secondary | ICD-10-CM

## 2019-03-08 DIAGNOSIS — J449 Chronic obstructive pulmonary disease, unspecified: Secondary | ICD-10-CM | POA: Diagnosis not present

## 2019-03-08 DIAGNOSIS — I480 Paroxysmal atrial fibrillation: Secondary | ICD-10-CM

## 2019-03-08 DIAGNOSIS — N184 Chronic kidney disease, stage 4 (severe): Secondary | ICD-10-CM

## 2019-03-08 DIAGNOSIS — I5032 Chronic diastolic (congestive) heart failure: Secondary | ICD-10-CM

## 2019-03-08 NOTE — Progress Notes (Signed)
Subjective:    Patient ID: Phillip [name], male    DOB: [birth year], 58 y.o.   MRN: [id number]  HPI Here for physical  Did have flare of atrial fibrillation Got ablation and has been better some  Still driving truck fairly full time Some SOB chronically---relates to the weather Awakens with some rattle and phlegm Continues with Dr Celesta Aver on symbicort No fever  Keeps up with kidney specialist---Singh No recent visits though  Current Outpatient Medications on File Prior to Visit  Medication Sig Dispense Refill  . albuterol (PROAIR HFA) 108 (90 Base) MCG/ACT inhaler Inhale 2 puffs into the lungs every 4 (four) hours as needed for wheezing or shortness of breath. 1 Inhaler 3  . albuterol (PROVENTIL) (2.5 MG/3ML) 0.083% nebulizer solution Take 3 mLs (2.5 mg total) by nebulization every 6 (six) hours as needed for wheezing or shortness of breath. 360 mL 11  . allopurinol (ZYLOPRIM) 300 MG tablet TAKE 1 TABLET DAILY BY MOUTH. 30 tablet 11  . budesonide-formoterol (SYMBICORT) 80-4.5 MCG/ACT inhaler USE 2 INHALATIONS TWICE A DAY (NEED APPOINTMENT) (Patient taking differently: Inhale 2 puffs into the lungs 2 (two) times a day. ) 30.6 g 3  . carvedilol (COREG) 6.25 MG tablet TAKE 1 TABLET TWICE A DAY WITH MEALS 180 tablet 3  . cloNIDine (CATAPRES) 0.1 MG tablet TAKE 1 TABLET BY MOUTH THREE TIMES A DAY 90 tablet 3  . colchicine 0.6 MG tablet Take 1 tablet (0.6 mg total) by mouth daily as needed (gout). 30 tablet 1  . famotidine (PEPCID) 20 MG tablet Take 20 mg by mouth at bedtime.    . hydrALAZINE (APRESOLINE) 50 MG tablet TAKE ONE AND ONE-HALF TABLETS THREE TIMES A DAY 405 tablet 3  . montelukast (SINGULAIR) 10 MG tablet TAKE 1 TABLET BY MOUTH EVERYDAY AT BEDTIME 30 tablet 5  . naproxen sodium (ALEVE) 220 MG tablet Take 440 mg by mouth 2 (two) times daily as needed (pain).    [name] oxymetazoline (AFRIN NASAL SPRAY) 0.05 % nasal spray Place 1 spray into both nostrils 2 (two) times daily as  needed for congestion.    . pantoprazole (PROTONIX) 40 MG tablet Take 30- 60 min before your first and last meals of the day 60 tablet 5  . potassium chloride SA (KLOR-CON M20) 20 MEQ tablet Take 2 tablets (40 mEq total) by mouth every morning AND 1 tablet (20 mEq total) every evening. 270 tablet 3  . torsemide (DEMADEX) 20 MG tablet TAKE 3 TABLETS TWICE A DAY 540 tablet 3  . UNABLE TO FIND Med Name: CPAP with sleep    . XARELTO 20 MG TABS tablet TAKE 1 TABLET DAILY WITH SUPPER 90 tablet 4   No current facility-administered medications on file prior to visit.    Allergies  Allergen Reactions  . Other Anaphylaxis    mushrooms    Past Medical History:  Diagnosis Date  . CHF (congestive heart failure) ([place]) 11/15   EF 40-45%, improved with follow-up  . COPD (chronic obstructive pulmonary disease) ([place])   . Gout   . History of DVT (deep vein thrombosis)   . Hypertension   . Hypertensive cardiovascular disease   . Kidney dysfunction   . Morbid obesity ([place])   . OSA on CPAP   . Paroxysmal atrial fibrillation ([facility])   . PNA (pneumonia)   . Pulmonary embolism ([place]) 2010   left leg DVT    Past Surgical History:  Procedure Laterality Date  . A-FLUTTER ABLATION N/A 07/22/2017  Procedure: A-FLUTTER ABLATION;  Surgeon: Thompson Grayer, MD;  Location: [facility];  Service: Cardiovascular;  Laterality: N/A;  . CARDIOVERSION N/A 10/14/2018   Procedure: CARDIOVERSION;  Surgeon: Jolaine Artist, MD;  Location: [facility] ENDOSCOPY;  Service: Cardiovascular;  Laterality: N/A;  . UMBILICAL HERNIA REPAIR  1992    Family History  Problem Relation Age of Onset  . Hypertension Mother   . Diabetes Father   . CAD Father   . Stomach cancer Father   . Clotting disorder Father   . Heart disease Father        transplant  . Allergies Brother     Social History   Socioeconomic History  . Marital status: Divorced    Spouse name: Not on file  . Number of children: 2  . Years of education:  83  . Highest education level: Not on file  Occupational History  . Occupation: Drives dump truck    Comment:    . Occupation: Airline pilot    Comment: Disabled  Tobacco Use  . Smoking status: Former Smoker    Packs/day: 0.25    Years: 1.00    Pack years: 0.25    Types: Cigarettes    Quit date: 07/23/1989    Years since quitting: 29.6  . Smokeless tobacco: Never Used  Substance and Sexual Activity  . Alcohol use: No    Alcohol/week: 0.0 standard drinks  . Drug use: No  . Sexual activity: Not Currently  Other Topics Concern  . Not on file  Social History Narrative   Pt lives in [place], alone.   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   Has 2 sons in [place]      No living will   Would want sons to make decisions for him   Would accept resuscitation   Social Determinants of Health   Financial Resource Strain:   . Difficulty of Paying Living Expenses: Not on file  Food Insecurity:   . Worried About Charity fundraiser in the Last Year: Not on file  . Ran Out of Food in the Last Year: Not on file  Transportation Needs:   . Lack of Transportation (Medical): Not on file  . Lack of Transportation (Non-Medical): Not on file  Physical Activity:   . Days of Exercise per Week: Not on file  . Minutes of Exercise per Session: Not on file  Stress:   . Feeling of Stress : Not on file  Social Connections:   . Frequency of Communication with Friends and Family: Not on file  . Frequency of Social Gatherings with Friends and Family: Not on file  . Attends Religious Services: Not on file  . Active Member of Clubs or Organizations: Not on file  . Attends Archivist Meetings: Not on file  . Marital Status: Not on file  Intimate Partner Violence:   . Fear of Current or Ex-Partner: Not on file  . Emotionally Abused: Not on file  . Physically Abused: Not on file  . Sexually Abused: Not on file   Review of Systems  Constitutional: Negative  for fatigue.       Weight up and down--uses extra fluid pill prn Not lifting now--but does do treadmill and light weights Wears seat belt  HENT: Positive for hearing loss. Negative for trouble swallowing.        Feels heartbeat in right ear Overdue for dentist  Eyes: Negative for visual disturbance.  No diplopia or unilateral vision loss  Respiratory: Positive for cough, shortness of breath and wheezing.   Cardiovascular: Positive for chest pain and leg swelling. Negative for palpitations.       Feels like indigestion---does take pantoprazole  Gastrointestinal: Negative for blood in stool and constipation.  Endocrine: Negative for polydipsia and polyuria.  Genitourinary: Positive for frequency. Negative for difficulty urinating and urgency.       No sexual problems  Musculoskeletal: Positive for arthralgias.       Left knee mostly  Skin: Negative for rash.       No suspicious lesions  Allergic/Immunologic: Negative for environmental allergies and immunocompromised state.  Neurological: Negative for dizziness, syncope, light-headedness and headaches.  Hematological: Negative for adenopathy. Bruises/bleeds easily.  Psychiatric/Behavioral: Negative for dysphoric mood. The patient is not nervous/anxious.        Sleeps with CPAP every night Nocturia x 2---able to get back to sleep       Objective:   Physical Exam  Constitutional: He is oriented to person, place, and time. No distress.  HENT:  Head: Normocephalic and atraumatic.  Right Ear: External ear normal.  Left Ear: External ear normal.  Mouth/Throat: Oropharynx is clear and moist. No oropharyngeal exudate.  Eyes: Pupils are equal, round, and reactive to light. Conjunctivae are normal.  Neck: No thyromegaly present.  Cardiovascular: Normal rate, regular rhythm, normal heart sounds and intact distal pulses. Exam reveals no gallop.  No murmur heard. Some extra beats but regular  Respiratory: He has no wheezes. He has no  rales.  Did have dyspnea in waiting room sats good and settled right down Decreased breath sounds but clear  GI: Soft. There is no abdominal tenderness.  Musculoskeletal:        General: No tenderness or edema.  Lymphadenopathy:    He has no cervical adenopathy.  Neurological: He is alert and oriented to person, place, and time.  Skin: No rash noted. No erythema.  Psychiatric: He has a normal mood and affect. His behavior is normal.           Assessment & Plan:

## 2019-03-08 NOTE — Assessment & Plan Note (Signed)
Had ablation Regular now On eliquis

## 2019-03-08 NOTE — Assessment & Plan Note (Signed)
Seems to be causing his dyspnea Asked him to try the albuterol today Check in with Dr Melvyn Novas if persists

## 2019-03-08 NOTE — Assessment & Plan Note (Signed)
Compensated now Is attentive to weight, etc

## 2019-03-08 NOTE — Assessment & Plan Note (Signed)
Discussed exercise Still doesn't want flu vaccine but will take the COVID when available FIT Defer PSA to next year

## 2019-03-08 NOTE — Telephone Encounter (Signed)
I left a message for patient to return my call.  Dr.Letvak wants patient to schedule his cpx for next year.  Patient had a cpx today.

## 2019-03-08 NOTE — Assessment & Plan Note (Signed)
Recent labs fairly stable

## 2019-03-14 ENCOUNTER — Other Ambulatory Visit (HOSPITAL_COMMUNITY): Payer: Managed Care, Other (non HMO)

## 2019-03-14 ENCOUNTER — Encounter (HOSPITAL_COMMUNITY): Payer: Managed Care, Other (non HMO) | Admitting: Internal Medicine

## 2019-03-22 ENCOUNTER — Other Ambulatory Visit: Payer: Self-pay

## 2019-03-22 ENCOUNTER — Ambulatory Visit (INDEPENDENT_AMBULATORY_CARE_PROVIDER_SITE_OTHER): Payer: Managed Care, Other (non HMO) | Admitting: Internal Medicine

## 2019-03-22 ENCOUNTER — Other Ambulatory Visit: Payer: Self-pay | Admitting: Internal Medicine

## 2019-03-22 ENCOUNTER — Encounter: Payer: Self-pay | Admitting: Internal Medicine

## 2019-03-22 DIAGNOSIS — J4530 Mild persistent asthma, uncomplicated: Secondary | ICD-10-CM | POA: Diagnosis not present
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
## 2019-03-22 NOTE — Patient Instructions (Addendum)
GERD (REFLUX)  is an extremely common cause of respiratory symptoms just like yours , many times with no obvious heartburn at all.    It can be treated with medication, but also with lifestyle changes including elevation of the head of your bed (ideally with 6 -8inch blocks under the headboard of your bed),  Smoking cessation, avoidance of late meals, excessive alcohol, and avoid fatty foods, chocolate, peppermint, colas, red wine, and acidic juices such as orange juice.  NO MINT OR MENTHOL PRODUCTS SO NO COUGH DROPS  USE SUGARLESS CANDY INSTEAD (Jolley ranchers or Stover's or Life Savers) or even ice chips will also do - the key is to swallow to prevent all throat clearing. NO OIL BASED VITAMINS - use powdered substitutes.  Avoid fish oil when coughing.  Prednisone 10 mg take  4 each am x 2 days,   2 each am x 2 days,  1 each am x 2 days and stop    If not improving will need to see you in the office  - otherwise return in 6  Months
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
## 2019-04-18 ENCOUNTER — Ambulatory Visit (HOSPITAL_COMMUNITY)
Admission: RE | Admit: 2019-04-18 | Discharge: 2019-04-18 | Disposition: A | Discharge status: Home / Self Care | Payer: Managed Care, Other (non HMO) | Source: Ambulatory Visit | Attending: Nurse Practitioner | Admitting: Nurse Practitioner

## 2019-04-18 ENCOUNTER — Encounter (HOSPITAL_COMMUNITY): Payer: Self-pay | Admitting: Nurse Practitioner

## 2019-04-18 ENCOUNTER — Other Ambulatory Visit: Payer: Self-pay

## 2019-04-18 VITALS — BP 160/80 | HR 89 | Ht 69.0 in | Wt 386.0 lb

## 2019-04-18 DIAGNOSIS — Z7901 Long term (current) use of anticoagulants: Secondary | ICD-10-CM | POA: Diagnosis not present

## 2019-04-18 DIAGNOSIS — I110 Hypertensive heart disease with heart failure: Secondary | ICD-10-CM | POA: Insufficient documentation

## 2019-04-18 DIAGNOSIS — G4733 Obstructive sleep apnea (adult) (pediatric): Secondary | ICD-10-CM | POA: Diagnosis not present

## 2019-04-18 DIAGNOSIS — Z86718 Personal history of other venous thrombosis and embolism: Secondary | ICD-10-CM | POA: Diagnosis not present

## 2019-04-18 DIAGNOSIS — I509 Heart failure, unspecified: Secondary | ICD-10-CM | POA: Insufficient documentation

## 2019-04-18 DIAGNOSIS — Z91018 Allergy to other foods: Secondary | ICD-10-CM | POA: Diagnosis not present

## 2019-04-18 DIAGNOSIS — D6869 Other thrombophilia: Secondary | ICD-10-CM

## 2019-04-18 DIAGNOSIS — M109 Gout, unspecified: Secondary | ICD-10-CM | POA: Diagnosis not present

## 2019-04-18 DIAGNOSIS — J449 Chronic obstructive pulmonary disease, unspecified: Secondary | ICD-10-CM | POA: Diagnosis not present

## 2019-04-18 DIAGNOSIS — Z800 Family history of malignant neoplasm of digestive organs: Secondary | ICD-10-CM | POA: Diagnosis not present

## 2019-04-18 DIAGNOSIS — I080 Rheumatic disorders of both mitral and aortic valves: Secondary | ICD-10-CM | POA: Insufficient documentation

## 2019-04-18 DIAGNOSIS — Z87891 Personal history of nicotine dependence: Secondary | ICD-10-CM | POA: Insufficient documentation

## 2019-04-18 DIAGNOSIS — I480 Paroxysmal atrial fibrillation: Secondary | ICD-10-CM | POA: Insufficient documentation

## 2019-04-18 DIAGNOSIS — I4892 Unspecified atrial flutter: Secondary | ICD-10-CM | POA: Insufficient documentation

## 2019-04-18 DIAGNOSIS — Z833 Family history of diabetes mellitus: Secondary | ICD-10-CM | POA: Insufficient documentation

## 2019-04-18 DIAGNOSIS — Z8249 Family history of ischemic heart disease and other diseases of the circulatory system: Secondary | ICD-10-CM | POA: Insufficient documentation

## 2019-04-18 DIAGNOSIS — Z6841 Body Mass Index (BMI) 40.0 and over, adult: Secondary | ICD-10-CM | POA: Diagnosis not present

## 2019-04-18 DIAGNOSIS — Z79899 Other long term (current) drug therapy: Secondary | ICD-10-CM | POA: Diagnosis not present

## 2019-04-18 LAB — BASIC METABOLIC PANEL
Anion gap: 9 (ref 5–15)
BUN: 24 mg/dL — ABNORMAL HIGH (ref 6–20)
CO2: 26 mmol/L (ref 22–32)
Calcium: 8.9 mg/dL (ref 8.9–10.3)
Chloride: 108 mmol/L (ref 98–111)
Creatinine, Ser: 2.19 mg/dL — ABNORMAL HIGH (ref 0.61–1.24)
GFR calc Af Amer: 38 mL/min — ABNORMAL LOW (ref >60)
GFR calc non Af Amer: 32 mL/min — ABNORMAL LOW (ref >60)
Glucose, Bld: 90 mg/dL (ref 70–99)
Potassium: 3.8 mmol/L (ref 3.5–5.1)
Sodium: 143 mmol/L (ref 135–145)

## 2019-04-18 LAB — CBC
HCT: 40.6 % (ref 39.0–52.0)
Hemoglobin: 12 g/dL — ABNORMAL LOW (ref 13.0–17.0)
MCH: 27.8 pg (ref 26.0–34.0)
MCHC: 29.6 g/dL — ABNORMAL LOW (ref 30.0–36.0)
MCV: 94.2 fL (ref 80.0–100.0)
Platelets: 190 10*3/uL (ref 150–400)
RBC: 4.31 MIL/uL (ref 4.22–5.81)
RDW: 15 % (ref 11.5–15.5)
WBC: 4.1 10*3/uL (ref 4.0–10.5)
nRBC: 0 % (ref 0.0–0.2)
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
## 2019-04-18 NOTE — Patient Instructions (Signed)
Cardioversion scheduled for Friday, January 29th  - Arrive at the [facility] and go to admitting at 9:30AM  -Do not eat or drink anything after midnight the night prior to your procedure.  - Take all your morning medication with a sip of water prior to arrival.  - You will not be able to drive home after your procedure.   Increase toresmide to 4 tablets daily for the next 3 days then return to normal dosing  Increase potassium to 77meq twice a day for 3 days then return to normal dosing.
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
## 2019-04-19 ENCOUNTER — Other Ambulatory Visit (HOSPITAL_COMMUNITY): Payer: Self-pay | Admitting: *Deleted

## 2019-04-19 ENCOUNTER — Other Ambulatory Visit (HOSPITAL_COMMUNITY)
Admission: RE | Admit: 2019-04-19 | Discharge: 2019-04-19 | Disposition: A | Discharge status: Home / Self Care | Payer: Managed Care, Other (non HMO) | Source: Ambulatory Visit | Attending: Cardiovascular Disease | Admitting: Cardiovascular Disease

## 2019-04-19 DIAGNOSIS — Z01812 Encounter for preprocedural laboratory examination: Secondary | ICD-10-CM | POA: Insufficient documentation

## 2019-04-19 DIAGNOSIS — Z20822 Contact with and (suspected) exposure to covid-19: Secondary | ICD-10-CM | POA: Insufficient documentation

## 2019-04-19 LAB — SARS CORONAVIRUS 2 (TAT 6-24 HRS): SARS Coronavirus 2: NEGATIVE

## 2019-04-19 MED ORDER — TORSEMIDE 20 MG PO TABS: 60.0000 mg | ORAL_TABLET | Freq: Two times a day (BID) | ORAL | 3 refills | Status: DC

## 2019-04-20 NOTE — Addendum Note (Signed)
Encounter addended by: Sherran Needs, NP on: 04/20/2019 10:11 AM  Actions taken: Level of Service modified

## 2019-04-21 NOTE — Progress Notes (Signed)
Pre call done. Patient aware to be here 1 hr prior, NPO, with a ride. Reminded to take carvedilol in am with sm. sip of water.  Patient has been quarantined since test.

## 2019-04-22 ENCOUNTER — Ambulatory Visit (HOSPITAL_COMMUNITY): Payer: Managed Care, Other (non HMO) | Admitting: Certified Registered"

## 2019-04-22 ENCOUNTER — Ambulatory Visit (HOSPITAL_COMMUNITY)
Admission: RE | Admit: 2019-04-22 | Discharge: 2019-04-22 | Disposition: A | Discharge status: Home / Self Care | Payer: Managed Care, Other (non HMO) | Attending: Cardiovascular Disease | Admitting: Cardiovascular Disease

## 2019-04-22 ENCOUNTER — Other Ambulatory Visit: Payer: Self-pay

## 2019-04-22 ENCOUNTER — Encounter (HOSPITAL_COMMUNITY)
Admission: RE | Disposition: A | Discharge status: Home / Self Care | Payer: Self-pay | Source: Home / Self Care | Attending: Cardiovascular Disease

## 2019-04-22 DIAGNOSIS — I480 Paroxysmal atrial fibrillation: Secondary | ICD-10-CM | POA: Insufficient documentation

## 2019-04-22 DIAGNOSIS — J449 Chronic obstructive pulmonary disease, unspecified: Secondary | ICD-10-CM | POA: Insufficient documentation

## 2019-04-22 DIAGNOSIS — Z7901 Long term (current) use of anticoagulants: Secondary | ICD-10-CM | POA: Diagnosis not present

## 2019-04-22 DIAGNOSIS — G4733 Obstructive sleep apnea (adult) (pediatric): Secondary | ICD-10-CM | POA: Insufficient documentation

## 2019-04-22 DIAGNOSIS — Z87891 Personal history of nicotine dependence: Secondary | ICD-10-CM | POA: Insufficient documentation

## 2019-04-22 DIAGNOSIS — I110 Hypertensive heart disease with heart failure: Secondary | ICD-10-CM | POA: Diagnosis not present

## 2019-04-22 DIAGNOSIS — I509 Heart failure, unspecified: Secondary | ICD-10-CM | POA: Insufficient documentation

## 2019-04-22 DIAGNOSIS — Z6841 Body Mass Index (BMI) 40.0 and over, adult: Secondary | ICD-10-CM | POA: Insufficient documentation

## 2019-04-22 DIAGNOSIS — I4892 Unspecified atrial flutter: Secondary | ICD-10-CM | POA: Diagnosis not present

## 2019-04-22 DIAGNOSIS — M109 Gout, unspecified: Secondary | ICD-10-CM | POA: Diagnosis not present

## 2019-04-22 DIAGNOSIS — Z79899 Other long term (current) drug therapy: Secondary | ICD-10-CM | POA: Insufficient documentation

## 2019-04-22 DIAGNOSIS — Z86718 Personal history of other venous thrombosis and embolism: Secondary | ICD-10-CM | POA: Diagnosis not present

## 2019-04-22 DIAGNOSIS — I4819 Other persistent atrial fibrillation: Secondary | ICD-10-CM | POA: Diagnosis not present

## 2019-04-22 DIAGNOSIS — Z7951 Long term (current) use of inhaled steroids: Secondary | ICD-10-CM | POA: Diagnosis not present

## 2019-04-22 HISTORY — PX: CARDIOVERSION: SHX1299

## 2019-04-22 SURGERY — CARDIOVERSION
Anesthesia: General

## 2019-04-22 MED ORDER — PROPOFOL 10 MG/ML IV BOLUS
INTRAVENOUS | Status: DC | PRN
  Administered 2019-04-22: 100 mg via INTRAVENOUS

## 2019-04-22 MED ORDER — LIDOCAINE 2% (20 MG/ML) 5 ML SYRINGE
INTRAMUSCULAR | Status: DC | PRN
  Administered 2019-04-22: 60 mg via INTRAVENOUS

## 2019-04-22 NOTE — Transfer of Care (Signed)
Immediate Anesthesia Transfer of Care Note  Patient: Phillip [name]  Procedure(s) Performed: CARDIOVERSION (N/A )  Patient Location: Endoscopy Unit  Anesthesia Type:General  Level of Consciousness: drowsy and patient cooperative  Airway & Oxygen Therapy: Patient Spontanous Breathing  Post-op Assessment: Report given to RN and Post -op Vital signs reviewed and stable  Post vital signs: Reviewed and stable  Last Vitals:  Vitals Value Taken Time  BP 150/82 04/22/19 1041  Temp    Pulse 101 04/22/19 1041  Resp 25 04/22/19 1041  SpO2 88 % 04/22/19 1041    Last Pain:  Vitals:   04/22/19 1001  TempSrc: Oral  PainSc: 0-No pain         Complications: No apparent anesthesia complications

## 2019-04-22 NOTE — Interval H&P Note (Signed)
History and Physical Interval Note:  04/22/2019 3:44 PM  Phillip [name]  has presented today for surgery, with the diagnosis of ATRIAL FIBRILLATION.  The various methods of treatment have been discussed with the patient and family. After consideration of risks, benefits and other options for treatment, the patient has consented to  Procedure(s): CARDIOVERSION (N/A) as a surgical intervention.  The patient's history has been reviewed, patient examined, no change in status, stable for surgery.  I have reviewed the patient's chart and labs.  Questions were answered to the patient's satisfaction.     Mertie Moores

## 2019-04-22 NOTE — CV Procedure (Signed)
    Cardioversion Note  Phillip [name] [id number] Mar 06, 1961  Procedure: DC Cardioversion Indications: atrial fib   Procedure Details Consent: Obtained Time Out: Verified patient identification, verified procedure, site/side was marked, verified correct patient position, special equipment/implants available, Radiology Safety Procedures followed,  medications/allergies/relevent history reviewed, required imaging and test results available.  Performed  The patient has been on adequate anticoagulation.  The patient received IV Lidocaine 80 mg IV followed by Propofol 100 mg IV  for sedation.  Synchronous cardioversion was performed at 200, 200, 200  ( all with manual hand pressure on the anterior pad)  joules.  The cardioversion was unsuccessful.     Complications: No apparent complications Patient did tolerate procedure well.   Thayer Headings, Brooke Bonito., MD, [facility] 04/22/2019, 10:39 AM

## 2019-04-22 NOTE — Discharge Instructions (Signed)
Electrical Cardioversion Electrical cardioversion is the delivery of a jolt of electricity to restore a normal rhythm to the heart. A rhythm that is too fast or is not regular keeps the heart from pumping well. In this procedure, sticky patches or metal paddles are placed on the chest to deliver electricity to the heart from a device. This procedure may be done in an emergency if:  There is low or no blood pressure as a result of the heart rhythm.  Normal rhythm must be restored as fast as possible to protect the brain and heart from further damage.  It may save a life. This may also be a scheduled procedure for irregular or fast heart rhythms that are not immediately life-threatening. Tell a health care provider about:  Any allergies you have.  All medicines you are taking, including vitamins, herbs, eye drops, creams, and over-the-counter medicines.  Any problems you or family members have had with anesthetic medicines.  Any blood disorders you have.  Any surgeries you have had.  Any medical conditions you have.  Whether you are pregnant or may be pregnant. What are the risks? Generally, this is a safe procedure. However, problems may occur, including:  Allergic reactions to medicines.  A blood clot that breaks free and travels to other parts of your body.  The possible return of an abnormal heart rhythm within hours or days after the procedure.  Your heart stopping (cardiac arrest). This is rare. What happens before the procedure? Medicines  Your health care provider may have you start taking: ? Blood-thinning medicines (anticoagulants) so your blood does not clot as easily. ? Medicines to help stabilize your heart rate and rhythm.  Ask your health care provider about: ? Changing or stopping your regular medicines. This is especially important if you are taking diabetes medicines or blood thinners. ? Taking medicines such as aspirin and ibuprofen. These medicines can  thin your blood. Do not take these medicines unless your health care provider tells you to take them. ? Taking over-the-counter medicines, vitamins, herbs, and supplements. General instructions  Follow instructions from your health care provider about eating or drinking restrictions.  Plan to have someone take you home from the hospital or clinic.  If you will be going home right after the procedure, plan to have someone with you for 24 hours.  Ask your health care provider what steps will be taken to help prevent infection. These may include washing your skin with a germ-killing soap. What happens during the procedure?   An IV will be inserted into one of your veins.  Sticky patches (electrodes) or metal paddles may be placed on your chest.  You will be given a medicine to help you relax (sedative).  An electrical shock will be delivered. The procedure may vary among health care providers and hospitals. What can I expect after the procedure?  Your blood pressure, heart rate, breathing rate, and blood oxygen level will be monitored until you leave the hospital or clinic.  Your heart rhythm will be watched to make sure it does not change.  You may have some redness on the skin where the shocks were given. Follow these instructions at home:  Do not drive for 24 hours if you were given a sedative during your procedure.  Take over-the-counter and prescription medicines only as told by your health care provider.  Ask your health care provider how to check your pulse. Check it often.  Rest for 48 hours after the procedure or   as told by your health care provider.  Avoid or limit your caffeine use as told by your health care provider.  Keep all follow-up visits as told by your health care provider. This is important. Contact a health care provider if:  You feel like your heart is beating too quickly or your pulse is not regular.  You have a serious muscle cramp that does not go  away. Get help right away if:  You have discomfort in your chest.  You are dizzy or you feel faint.  You have trouble breathing or you are short of breath.  Your speech is slurred.  You have trouble moving an arm or leg on one side of your body.  Your fingers or toes turn cold or blue. Summary  Electrical cardioversion is the delivery of a jolt of electricity to restore a normal rhythm to the heart.  This procedure may be done right away in an emergency or may be a scheduled procedure if the condition is not an emergency.  Generally, this is a safe procedure.  After the procedure, check your pulse often as told by your health care provider. This information is not intended to replace advice given to you by your health care provider. Make sure you discuss any questions you have with your health care provider. Document Revised: 10/11/2018 Document Reviewed: 10/11/2018 Elsevier Patient Education  2020 [employer] Inc[employer]  

## 2019-04-22 NOTE — Anesthesia Preprocedure Evaluation (Addendum)
Anesthesia Evaluation  Patient identified by MRN, date of birth, ID band Patient awake    Reviewed: Allergy & Precautions, NPO status , Patient's Chart, lab work & pertinent test results  Airway Mallampati: I  TM Distance: >3 FB Neck ROM: Full    Dental no notable dental hx.    Pulmonary asthma , sleep apnea and Continuous Positive Airway Pressure Ventilation , COPD,  COPD inhaler, former smoker, PE Home oxygen prn   Pulmonary exam normal breath sounds clear to auscultation       Cardiovascular hypertension, Pt. on medications and Pt. on home beta blockers +CHF and + DVT  Normal cardiovascular exam+ dysrhythmias Atrial Fibrillation  Rhythm:Regular Rate:Normal  ECHO: 1. Left ventricular ejection fraction, by visual estimation, is 45 to 50%. The left ventricle has normal function. There is mildly increased left ventricular hypertrophy. 2. Mid inferolateral segment is abnormal. 3. Left ventricular diastolic parameters are consistent with Grade II diastolic dysfunction (pseudonormalization). 4. Severely dilated left ventricular internal cavity size. 5. Global right ventricle has normal systolic function.The right ventricular size is normal. No increase in right ventricular wall thickness. 6. Left atrial size was mildly dilated. 7. Right atrial size was normal. 8. The mitral valve is normal in structure. Mild mitral valve regurgitation. No evidence of mitral stenosis. 9. The tricuspid valve is normal in structure. Tricuspid valve regurgitation is not demonstrated. 10. Aortic regurgitation PHT measures 221 msec. 11. Aortic valve regurgitation is moderate to severe. 12. The aortic valve is normal in structure. Aortic valve regurgitation is moderate to severe. No evidence of aortic valve sclerosis or stenosis. 13. The pulmonic valve was normal in structure. Pulmonic valve regurgitation is not visualized. 14. The inferior vena cava is  normal in size with greater than 50% respiratory variability, suggesting right atrial pressure of 3 mmHg. 15. The average left ventricular global longitudinal strain is -17.1 %. In comparison to the previous echocardiogram(s): Prior examinations were reviewed in a side by side comparison of images. EF has improved from 20-25%.   Neuro/Psych negative neurological ROS  negative psych ROS   GI/Hepatic Neg liver ROS, GERD  Medicated and Controlled,  Endo/Other  Morbid obesity ([facility])  Renal/GU Renal disease     Musculoskeletal Gout   Abdominal (+) + obese,   Peds  Hematology negative hematology ROS (+)   Anesthesia Other Findings ATRIAL FIBRILLATION  Reproductive/Obstetrics                            Anesthesia Physical Anesthesia Plan  ASA: IV  Anesthesia Plan: General   Post-op Pain Management:    Induction: Intravenous  PONV Risk Score and Plan: 2 and Treatment may vary due to age or medical condition and Propofol infusion  Airway Management Planned: Mask  Additional Equipment:   Intra-op Plan:   Post-operative Plan:   Informed Consent: I have reviewed the patients History and Physical, chart, labs and discussed the procedure including the risks, benefits and alternatives for the proposed anesthesia with the patient or authorized representative who has indicated his/her understanding and acceptance.     Dental advisory given  Plan Discussed with: CRNA  Anesthesia Plan Comments:        Anesthesia Quick Evaluation

## 2019-04-23 NOTE — Anesthesia Postprocedure Evaluation (Signed)
Anesthesia Post Note  Patient: Phillip [name]  Procedure(s) Performed: CARDIOVERSION (N/A )     Patient location during evaluation: Endoscopy Anesthesia Type: General Level of consciousness: awake and alert Pain management: pain level controlled Vital Signs Assessment: post-procedure vital signs reviewed and stable Respiratory status: spontaneous breathing, nonlabored ventilation, respiratory function stable and patient connected to nasal cannula oxygen Cardiovascular status: blood pressure returned to baseline and stable Postop Assessment: no apparent nausea or vomiting Anesthetic complications: no    Last Vitals:  Vitals:   04/22/19 1053 04/22/19 1100  BP: (!) 105/55   Pulse: 85 69  Resp: 15 (!) 26  Temp:    SpO2: 97% 98%    Last Pain:  Vitals:   04/22/19 1100  TempSrc:   PainSc: 0-No pain                 Jossilyn Benda P Jeniah Kishi

## 2019-04-25 ENCOUNTER — Telehealth: Payer: Self-pay | Admitting: Internal Medicine

## 2019-04-25 MED ORDER — PREDNISONE 10 MG PO TABS: ORAL_TABLET | ORAL | 0 refills | Status: DC

## 2019-04-25 MED ORDER — AZITHROMYCIN 250 MG PO TABS: ORAL_TABLET | ORAL | 0 refills | Status: DC

## 2019-04-25 NOTE — Telephone Encounter (Signed)
Called and spoke with pt who states his symptoms returned about a week ago. Pt has complaints of congestion, tightness in chest. Pt is also coughing and having SOB.  Pt said he just had an ablation performed and states it did not go well as he is still in afib but pt stated the way he feels does not feel like it is the afib.  Pt denies any fever as temp was 98.6. Pt ants to know if he can have an Rx for abx and steroids to help with his symptoms.  Dr. Melvyn Novas, please advise on this.

## 2019-04-25 NOTE — Telephone Encounter (Signed)
rx's sent to pharmacy.  Pt aware.  Nothing further needed at this time- will close encounter.

## 2019-04-25 NOTE — Telephone Encounter (Signed)
Won't hurt to try zpak/ Prednisone 10 mg take  4 each am x 2 days,   2 each am x 2 days,  1 each am x 2 days and stop and keep appt to see cards as planned and let them know if this helped or not.

## 2019-04-29 ENCOUNTER — Encounter (HOSPITAL_COMMUNITY): Payer: Self-pay | Admitting: Nurse Practitioner

## 2019-04-29 ENCOUNTER — Ambulatory Visit (HOSPITAL_COMMUNITY)
Admission: RE | Admit: 2019-04-29 | Discharge: 2019-04-29 | Disposition: A | Discharge status: Home / Self Care | Payer: Managed Care, Other (non HMO) | Source: Ambulatory Visit | Attending: Nurse Practitioner | Admitting: Nurse Practitioner

## 2019-04-29 ENCOUNTER — Other Ambulatory Visit: Payer: Self-pay

## 2019-04-29 VITALS — BP 120/62 | HR 90 | Ht 69.0 in | Wt 387.2 lb

## 2019-04-29 DIAGNOSIS — Z7951 Long term (current) use of inhaled steroids: Secondary | ICD-10-CM | POA: Insufficient documentation

## 2019-04-29 DIAGNOSIS — M109 Gout, unspecified: Secondary | ICD-10-CM | POA: Diagnosis not present

## 2019-04-29 DIAGNOSIS — Z86718 Personal history of other venous thrombosis and embolism: Secondary | ICD-10-CM | POA: Insufficient documentation

## 2019-04-29 DIAGNOSIS — Z87891 Personal history of nicotine dependence: Secondary | ICD-10-CM | POA: Diagnosis not present

## 2019-04-29 DIAGNOSIS — I2510 Atherosclerotic heart disease of native coronary artery without angina pectoris: Secondary | ICD-10-CM | POA: Insufficient documentation

## 2019-04-29 DIAGNOSIS — D6869 Other thrombophilia: Secondary | ICD-10-CM

## 2019-04-29 DIAGNOSIS — Z7901 Long term (current) use of anticoagulants: Secondary | ICD-10-CM | POA: Diagnosis not present

## 2019-04-29 DIAGNOSIS — Z800 Family history of malignant neoplasm of digestive organs: Secondary | ICD-10-CM | POA: Insufficient documentation

## 2019-04-29 DIAGNOSIS — G4733 Obstructive sleep apnea (adult) (pediatric): Secondary | ICD-10-CM | POA: Diagnosis not present

## 2019-04-29 DIAGNOSIS — Z8249 Family history of ischemic heart disease and other diseases of the circulatory system: Secondary | ICD-10-CM | POA: Diagnosis not present

## 2019-04-29 DIAGNOSIS — Z833 Family history of diabetes mellitus: Secondary | ICD-10-CM | POA: Insufficient documentation

## 2019-04-29 DIAGNOSIS — Z79899 Other long term (current) drug therapy: Secondary | ICD-10-CM | POA: Diagnosis not present

## 2019-04-29 DIAGNOSIS — J449 Chronic obstructive pulmonary disease, unspecified: Secondary | ICD-10-CM | POA: Insufficient documentation

## 2019-04-29 DIAGNOSIS — I480 Paroxysmal atrial fibrillation: Secondary | ICD-10-CM | POA: Diagnosis present

## 2019-04-29 DIAGNOSIS — Z6841 Body Mass Index (BMI) 40.0 and over, adult: Secondary | ICD-10-CM | POA: Insufficient documentation

## 2019-04-29 DIAGNOSIS — I4819 Other persistent atrial fibrillation: Secondary | ICD-10-CM | POA: Insufficient documentation

## 2019-04-29 DIAGNOSIS — Z86711 Personal history of pulmonary embolism: Secondary | ICD-10-CM | POA: Diagnosis not present

## 2019-04-29 LAB — BASIC METABOLIC PANEL WITH GFR
Anion gap: 9 (ref 5–15)
BUN: 29 mg/dL — ABNORMAL HIGH (ref 6–20)
CO2: 28 mmol/L (ref 22–32)
Calcium: 8.9 mg/dL (ref 8.9–10.3)
Chloride: 106 mmol/L (ref 98–111)
Creatinine, Ser: 2.11 mg/dL — ABNORMAL HIGH (ref 0.61–1.24)
GFR calc Af Amer: 39 mL/min — ABNORMAL LOW
GFR calc non Af Amer: 34 mL/min — ABNORMAL LOW
Glucose, Bld: 145 mg/dL — ABNORMAL HIGH (ref 70–99)
Potassium: 3.5 mmol/L (ref 3.5–5.1)
Sodium: 143 mmol/L (ref 135–145)

## 2019-04-29 LAB — MAGNESIUM: Magnesium: 2.2 mg/dL (ref 1.7–2.4)
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
## 2019-05-02 ENCOUNTER — Encounter (HOSPITAL_COMMUNITY): Payer: Self-pay | Admitting: Internal Medicine

## 2019-05-02 ENCOUNTER — Other Ambulatory Visit (HOSPITAL_COMMUNITY): Payer: Self-pay | Admitting: *Deleted

## 2019-05-02 ENCOUNTER — Ambulatory Visit (HOSPITAL_COMMUNITY)
Admission: RE | Admit: 2019-05-02 | Discharge: 2019-05-02 | Disposition: A | Discharge status: Home / Self Care | Payer: Managed Care, Other (non HMO) | Source: Ambulatory Visit | Attending: Internal Medicine | Admitting: Internal Medicine

## 2019-05-02 ENCOUNTER — Other Ambulatory Visit: Payer: Self-pay

## 2019-05-02 VITALS — BP 153/77 | HR 90 | Wt 385.6 lb

## 2019-05-02 DIAGNOSIS — I5042 Chronic combined systolic (congestive) and diastolic (congestive) heart failure: Secondary | ICD-10-CM | POA: Insufficient documentation

## 2019-05-02 DIAGNOSIS — Z86718 Personal history of other venous thrombosis and embolism: Secondary | ICD-10-CM | POA: Diagnosis not present

## 2019-05-02 DIAGNOSIS — Z800 Family history of malignant neoplasm of digestive organs: Secondary | ICD-10-CM | POA: Insufficient documentation

## 2019-05-02 DIAGNOSIS — Z832 Family history of diseases of the blood and blood-forming organs and certain disorders involving the immune mechanism: Secondary | ICD-10-CM | POA: Insufficient documentation

## 2019-05-02 DIAGNOSIS — G4733 Obstructive sleep apnea (adult) (pediatric): Secondary | ICD-10-CM | POA: Diagnosis not present

## 2019-05-02 DIAGNOSIS — M109 Gout, unspecified: Secondary | ICD-10-CM | POA: Insufficient documentation

## 2019-05-02 DIAGNOSIS — Z87891 Personal history of nicotine dependence: Secondary | ICD-10-CM | POA: Insufficient documentation

## 2019-05-02 DIAGNOSIS — Z8249 Family history of ischemic heart disease and other diseases of the circulatory system: Secondary | ICD-10-CM | POA: Diagnosis not present

## 2019-05-02 DIAGNOSIS — Z86711 Personal history of pulmonary embolism: Secondary | ICD-10-CM | POA: Insufficient documentation

## 2019-05-02 DIAGNOSIS — Z6841 Body Mass Index (BMI) 40.0 and over, adult: Secondary | ICD-10-CM | POA: Diagnosis not present

## 2019-05-02 DIAGNOSIS — Z7951 Long term (current) use of inhaled steroids: Secondary | ICD-10-CM | POA: Diagnosis not present

## 2019-05-02 DIAGNOSIS — I130 Hypertensive heart and chronic kidney disease with heart failure and stage 1 through stage 4 chronic kidney disease, or unspecified chronic kidney disease: Secondary | ICD-10-CM | POA: Diagnosis not present

## 2019-05-02 DIAGNOSIS — I483 Typical atrial flutter: Secondary | ICD-10-CM | POA: Insufficient documentation

## 2019-05-02 DIAGNOSIS — I5032 Chronic diastolic (congestive) heart failure: Secondary | ICD-10-CM

## 2019-05-02 DIAGNOSIS — N184 Chronic kidney disease, stage 4 (severe): Secondary | ICD-10-CM | POA: Insufficient documentation

## 2019-05-02 DIAGNOSIS — Z79899 Other long term (current) drug therapy: Secondary | ICD-10-CM | POA: Insufficient documentation

## 2019-05-02 DIAGNOSIS — Z7901 Long term (current) use of anticoagulants: Secondary | ICD-10-CM | POA: Insufficient documentation

## 2019-05-02 DIAGNOSIS — I480 Paroxysmal atrial fibrillation: Secondary | ICD-10-CM

## 2019-05-02 DIAGNOSIS — J449 Chronic obstructive pulmonary disease, unspecified: Secondary | ICD-10-CM | POA: Insufficient documentation

## 2019-05-02 DIAGNOSIS — Z9989 Dependence on other enabling machines and devices: Secondary | ICD-10-CM

## 2019-05-02 DIAGNOSIS — Z833 Family history of diabetes mellitus: Secondary | ICD-10-CM | POA: Diagnosis not present

## 2019-05-02 MED ORDER — FLECAINIDE ACETATE 100 MG PO TABS: 100.0000 mg | ORAL_TABLET | Freq: Two times a day (BID) | ORAL | 3 refills | Status: DC

## 2019-05-02 MED ORDER — HYDRALAZINE HCL 100 MG PO TABS: 100.0000 mg | ORAL_TABLET | Freq: Three times a day (TID) | ORAL | 3 refills | Status: DC

## 2019-05-02 NOTE — Progress Notes (Addendum)
Advanced Heart Failure Clinic Note   Referring Physician: [name] Primary Care: Silvio Pate Primary Cardiologist: [name]  EP: Dr Rayann Heman HF MD: Dr Haroldine Laws   HPI: Phillip [name] is a 59 y.o. male hx of PAF, remote PE, hx of DVT, OSA, morbid obesity, CKD Stage III,  HTN, and  combined chronic systolic/diastolic. S/P A Flutter ablation May 2019 .    EF in 2015 40-45% Echo 12/21/16 with EF 55-60% G2DD, mild aortic regurgitation. Last nuc 2008 no ischemia, no hx of cath due to CKD.   Developed AFL and underwent AFL ablation in 5/19.  I saw him last on 10/11/18 and was feeling bad. More SOB. Having some pressure in his chest and arm. + LE edema. Found to be in AFL/AF in 80s. Echo at that time with EF back down to 30%. Underwent DC-CV on 10/14/18 and referred to AF Clinic.   Developed recurrent AF at the end of January 2021. Underwent attempt at [facility] on 04/22/19 but failed to convert. Seen back in AF Clinic on 2/5. Nota able to use Tikosyn due to long QT. Not ablation candidate due to obesity. Considered amiodarone.   Echo 11/20 EF 45-50%  Returns today for f/u. Says he is feeling ok but can tell he is in AF. Not as much energy. More SOB. Can't walk as much. Wearing CPAP regularly. Mild edema but improved with extra torsemide recently.    Review of systems complete and found to be negative unless listed in HPI.    Past Medical History:  Diagnosis Date  . CHF (congestive heart failure) ([place]) 11/15   EF 40-45%, improved with follow-up  . COPD (chronic obstructive pulmonary disease) ([place])   . Gout   . History of DVT (deep vein thrombosis)   . Hypertension   . Hypertensive cardiovascular disease   . Kidney dysfunction   . Morbid obesity ([place])   . OSA on CPAP   . Paroxysmal atrial fibrillation ([facility])   . PNA (pneumonia)   . Pulmonary embolism ([place]) 2010   left leg DVT   Current Outpatient Medications  Medication Sig Dispense Refill  . albuterol (PROAIR HFA) 108 (90 Base) MCG/ACT inhaler  Inhale 2 puffs into the lungs every 4 (four) hours as needed for wheezing or shortness of breath. 1 Inhaler 3  . albuterol (PROVENTIL) (2.5 MG/3ML) 0.083% nebulizer solution Take 3 mLs (2.5 mg total) by nebulization every 6 (six) hours as needed for wheezing or shortness of breath. 360 mL 11  . allopurinol (ZYLOPRIM) 300 MG tablet Take 300 mg by mouth daily.    . budesonide-formoterol (SYMBICORT) 80-4.5 MCG/ACT inhaler Inhale 2 puffs into the lungs 2 (two) times daily.    . carvedilol (COREG) 6.25 MG tablet Take 6.25 mg by mouth 2 (two) times daily with a meal.    . cloNIDine (CATAPRES) 0.1 MG tablet Take 0.1 mg by mouth 3 (three) times daily.    . colchicine 0.6 MG tablet Take 1 tablet (0.6 mg total) by mouth daily as needed (gout). 30 tablet 1  . famotidine (PEPCID) 20 MG tablet Take 20 mg by mouth at bedtime.    . hydrALAZINE (APRESOLINE) 50 MG tablet Take 50 mg by mouth 3 (three) times daily.    . montelukast (SINGULAIR) 10 MG tablet Take 10 mg by mouth at bedtime.    . naproxen sodium (ALEVE) 220 MG tablet Take 440 mg by mouth 2 (two) times daily as needed (pain).    [name] oxymetazoline (AFRIN NASAL SPRAY) 0.05 % nasal  spray Place 1 spray into both nostrils 2 (two) times daily as needed for congestion.    . pantoprazole (PROTONIX) 40 MG tablet Take 40 mg by mouth 2 (two) times daily before a meal.    . potassium chloride SA (KLOR-CON M20) 20 MEQ tablet Take 2 tablets (40 mEq total) by mouth every morning AND 1 tablet (20 mEq total) every evening. 270 tablet 3  . rivaroxaban (XARELTO) 20 MG TABS tablet Take 20 mg by mouth daily with supper.    . torsemide (DEMADEX) 20 MG tablet Take 3 tablets (60 mg total) by mouth 2 (two) times daily. Take extra 1 tablet for weight gain 200 tablet 3  . UNABLE TO FIND Med Name: CPAP with sleep     No current facility-administered medications for this encounter.   Allergies  Allergen Reactions  . Other Anaphylaxis    mushrooms    Social History    Socioeconomic History  . Marital status: Divorced    Spouse name: Not on file  . Number of children: 2  . Years of education: 34  . Highest education level: Not on file  Occupational History  . Occupation: Drives dump truck    Comment:    . Occupation: Airline pilot    Comment: Disabled  Tobacco Use  . Smoking status: Former Smoker    Packs/day: 0.25    Years: 1.00    Pack years: 0.25    Types: Cigarettes    Quit date: 07/23/1989    Years since quitting: 29.7  . Smokeless tobacco: Never Used  Substance and Sexual Activity  . Alcohol use: No    Alcohol/week: 0.0 standard drinks  . Drug use: No  . Sexual activity: Not Currently  Other Topics Concern  . Not on file  Social History Narrative   Pt lives in [place], alone.   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   Has 2 sons in [place]      No living will   Would want sons to make decisions for him   Would accept resuscitation   Social Determinants of Health   Financial Resource Strain:   . Difficulty of Paying Living Expenses: Not on file  Food Insecurity:   . Worried About Charity fundraiser in the Last Year: Not on file  . Ran Out of Food in the Last Year: Not on file  Transportation Needs:   . Lack of Transportation (Medical): Not on file  . Lack of Transportation (Non-Medical): Not on file  Physical Activity:   . Days of Exercise per Week: Not on file  . Minutes of Exercise per Session: Not on file  Stress:   . Feeling of Stress : Not on file  Social Connections:   . Frequency of Communication with Friends and Family: Not on file  . Frequency of Social Gatherings with Friends and Family: Not on file  . Attends Religious Services: Not on file  . Active Member of Clubs or Organizations: Not on file  . Attends Archivist Meetings: Not on file  . Marital Status: Not on file  Intimate Partner Violence:   . Fear of Current or Ex-Partner: Not on file  . Emotionally  Abused: Not on file  . Physically Abused: Not on file  . Sexually Abused: Not on file    Family History  Problem Relation Age of Onset  . Hypertension Mother   . Diabetes Father   . CAD Father   .  Stomach cancer Father   . Clotting disorder Father   . Heart disease Father        transplant  . Allergies Brother    Vitals:   05/02/19 0851  BP: (!) 153/77  Pulse: 90  SpO2: 99%  Weight: (!) 174.9 kg (385 lb 9.6 oz)   Wt Readings from Last 3 Encounters:  05/02/19 (!) 174.9 kg (385 lb 9.6 oz)  04/29/19 (!) 175.6 kg (387 lb 3.2 oz)  04/22/19 (!) 171.5 kg (378 lb)   PHYSICAL EXAM: General:  Obese male. No resp difficulty General:  Well appearing. No resp difficulty HEENT: normal Neck: supple. no JVD. Carotids 2+ bilat; no bruits. No lymphadenopathy or thryomegaly appreciated. Cor: PMI nondisplaced. Irregular rate & rhythm. No rubs, gallops or murmurs. Lungs: clear Abdomen: soft, nontender, nondistended. No hepatosplenomegaly. No bruits or masses. Good bowel sounds. Extremities: no cyanosis, clubbing, rash, edema Neuro: alert & orientedx3, cranial nerves grossly intact. moves all 4 extremities w/o difficulty. Affect pleasant    ECG AF 101 Diffuse non-specific TWI Personally reviewed   ASSESSMENT & PLAN:  1. Chronic systolic/diastolic HF - Echo 10/3149 LVEF 55-60%, Grade 2 DD. - EF 7/20 back down to 30%. Suspect due to AF but rate not overly elevated. HTN may also be playing a role. - Symptoms improved after DC-CV on 10/11/18  - Echo 11/20 EF 45-50% - Now NYHA II-III in setting of recurrent AF  - Volume status looks good despite recurrent AF - Continue torsemide 60 bid for now. - Continue hydralazine 75 tid and carvedilol 6.25 bid. - Has not had cath due to CKD and lack of clear anginal symptoms - No ACE/ARB with CKD  2. PAF / Typical A-flutter, paroxysmal - S/P AFL ablation 07/2017. - s/p DC-CV of AFib on 10/14/18 - back in AF today - Continue Xarelto 20 mg daily.  -  No bleeding issues.   - He is compliant with CPAP. Needs weight loss - Has been seen in AF Clinic and felt not to be candidate for RFA. - I d/w Dr. Rayann Heman and AF Clinic. Not candidate for Tikosyn with CKD. Prefer not to use amio with young age. Think risks/benefits of flecainide is best choice. Start flecainide 100 bid. Will get ECG in 2-3 days. Continue b-blocker - Will need DC-CV if does not convert with flecainide  3. Hypertension - BP high here but says it is well controlled at home. Will need to follow closely - May need Paramedicine to assure compliance  6. Morbid obesity - Body mass index is 56.94 kg/m.  - Desperately needs weight loss  5. CKD IV - Creatinine baseline ~2.0-2.3 - Check BMET today  6. OSA on CPAP - reports compliance - needs weight loss    Glori Bickers, MD  9:24 AM

## 2019-05-02 NOTE — Patient Instructions (Addendum)
Increase Hydralazine to 100 mg Three times a day   Start Flecainide 100 mg Twice daily   Come in on this [facility] 2/11 for an EKG  Your physician has recommended that you have a Cardioversion (DCCV). Electrical Cardioversion uses a jolt of electricity to your heart either through paddles or wired patches attached to your chest. This is a controlled, usually prescheduled, procedure. Defibrillation is done under light anesthesia in the hospital, and you usually go home the day of the procedure. This is done to get your heart back into a normal rhythm. You are not awake for the procedure. Please see the instruction sheet given to you today.  Your physician recommends that you schedule a follow-up appointment in: 3 weeks in [place] physician recommends that you schedule a follow-up appointment in: 3 months  Cardioversion Instructions:  You are scheduled for a  Cardioversion on Mon 05/16/19 with Dr. Haroldine Laws.  Please arrive at the [facility] (Main Entrance A) at [facility]: [address] [place], [place] [address] at 8:00 am.   DIET: Nothing to eat or drink after midnight except a sip of water with medications (see medication instructions below)  Medication Instructions: Hold Torsemide AM of procedure  Continue your anticoagulant: XARELTO--Do Not miss any doses  COVID TEST:  Fri Feb 19 at [place] must have a responsible person to drive you home and stay in the waiting area during your procedure. Failure to do so could result in cancellation.  Bring your insurance cards.  *Special Note: Every effort is made to have your procedure done on time. Occasionally there are emergencies that occur at the hospital that may cause delays. Please be patient if a delay does occur.

## 2019-05-03 ENCOUNTER — Other Ambulatory Visit (HOSPITAL_COMMUNITY): Payer: Self-pay | Admitting: *Deleted

## 2019-05-05 ENCOUNTER — Ambulatory Visit (HOSPITAL_COMMUNITY)
Admission: RE | Admit: 2019-05-05 | Discharge: 2019-05-05 | Disposition: A | Discharge status: Home / Self Care | Payer: Managed Care, Other (non HMO) | Source: Ambulatory Visit | Attending: Cardiology | Admitting: Cardiology

## 2019-05-05 ENCOUNTER — Other Ambulatory Visit: Payer: Self-pay

## 2019-05-05 VITALS — HR 100 | Wt 385.1 lb

## 2019-05-05 DIAGNOSIS — I4819 Other persistent atrial fibrillation: Secondary | ICD-10-CM | POA: Diagnosis present

## 2019-05-05 NOTE — Patient Instructions (Signed)
Continue taking current medications  Follow up on Monday 2/15 at 10 am for anther EKG

## 2019-05-05 NOTE — Progress Notes (Signed)
EKG showed a-fib w/HR 95.  Will have pt come back in on Mon 2/15 for another EKG after being on Flecainide for a few days.

## 2019-05-05 NOTE — Progress Notes (Signed)
Pt in for repeat EKG today, however he states the pharmacy had a delay getting his medication so he wasn't able to pick it up until Wed PM and didn't start it until this AM.

## 2019-05-09 ENCOUNTER — Other Ambulatory Visit: Payer: Self-pay

## 2019-05-09 ENCOUNTER — Other Ambulatory Visit (HOSPITAL_COMMUNITY): Payer: Self-pay | Admitting: *Deleted

## 2019-05-09 ENCOUNTER — Ambulatory Visit (HOSPITAL_COMMUNITY)
Admission: RE | Admit: 2019-05-09 | Discharge: 2019-05-09 | Disposition: A | Discharge status: Home / Self Care | Payer: Managed Care, Other (non HMO) | Source: Ambulatory Visit | Attending: Internal Medicine | Admitting: Internal Medicine

## 2019-05-09 ENCOUNTER — Other Ambulatory Visit (HOSPITAL_COMMUNITY)
Admission: RE | Admit: 2019-05-09 | Discharge: 2019-05-09 | Disposition: A | Discharge status: Home / Self Care | Payer: Managed Care, Other (non HMO) | Source: Ambulatory Visit | Attending: Internal Medicine | Admitting: Internal Medicine

## 2019-05-09 VITALS — BP 160/100 | HR 100 | Wt 388.0 lb

## 2019-05-09 DIAGNOSIS — I5023 Acute on chronic systolic (congestive) heart failure: Secondary | ICD-10-CM | POA: Diagnosis not present

## 2019-05-09 DIAGNOSIS — Z7901 Long term (current) use of anticoagulants: Secondary | ICD-10-CM | POA: Insufficient documentation

## 2019-05-09 DIAGNOSIS — I4892 Unspecified atrial flutter: Secondary | ICD-10-CM | POA: Insufficient documentation

## 2019-05-09 DIAGNOSIS — I5043 Acute on chronic combined systolic (congestive) and diastolic (congestive) heart failure: Secondary | ICD-10-CM | POA: Insufficient documentation

## 2019-05-09 DIAGNOSIS — M109 Gout, unspecified: Secondary | ICD-10-CM | POA: Insufficient documentation

## 2019-05-09 DIAGNOSIS — Z87891 Personal history of nicotine dependence: Secondary | ICD-10-CM | POA: Insufficient documentation

## 2019-05-09 DIAGNOSIS — I130 Hypertensive heart and chronic kidney disease with heart failure and stage 1 through stage 4 chronic kidney disease, or unspecified chronic kidney disease: Secondary | ICD-10-CM | POA: Diagnosis present

## 2019-05-09 DIAGNOSIS — E669 Obesity, unspecified: Secondary | ICD-10-CM | POA: Diagnosis not present

## 2019-05-09 DIAGNOSIS — Z7951 Long term (current) use of inhaled steroids: Secondary | ICD-10-CM | POA: Diagnosis not present

## 2019-05-09 DIAGNOSIS — Z20822 Contact with and (suspected) exposure to covid-19: Secondary | ICD-10-CM | POA: Insufficient documentation

## 2019-05-09 DIAGNOSIS — N183 Chronic kidney disease, stage 3 unspecified: Secondary | ICD-10-CM

## 2019-05-09 DIAGNOSIS — Z8249 Family history of ischemic heart disease and other diseases of the circulatory system: Secondary | ICD-10-CM | POA: Insufficient documentation

## 2019-05-09 DIAGNOSIS — Z6841 Body Mass Index (BMI) 40.0 and over, adult: Secondary | ICD-10-CM | POA: Insufficient documentation

## 2019-05-09 DIAGNOSIS — Z86718 Personal history of other venous thrombosis and embolism: Secondary | ICD-10-CM | POA: Diagnosis not present

## 2019-05-09 DIAGNOSIS — Z833 Family history of diabetes mellitus: Secondary | ICD-10-CM | POA: Diagnosis not present

## 2019-05-09 DIAGNOSIS — I10 Essential (primary) hypertension: Secondary | ICD-10-CM

## 2019-05-09 DIAGNOSIS — Z79899 Other long term (current) drug therapy: Secondary | ICD-10-CM | POA: Diagnosis not present

## 2019-05-09 DIAGNOSIS — I4819 Other persistent atrial fibrillation: Secondary | ICD-10-CM | POA: Diagnosis not present

## 2019-05-09 DIAGNOSIS — I480 Paroxysmal atrial fibrillation: Secondary | ICD-10-CM | POA: Insufficient documentation

## 2019-05-09 DIAGNOSIS — G4733 Obstructive sleep apnea (adult) (pediatric): Secondary | ICD-10-CM

## 2019-05-09 DIAGNOSIS — N184 Chronic kidney disease, stage 4 (severe): Secondary | ICD-10-CM | POA: Diagnosis not present

## 2019-05-09 DIAGNOSIS — Z86711 Personal history of pulmonary embolism: Secondary | ICD-10-CM | POA: Insufficient documentation

## 2019-05-09 DIAGNOSIS — Z9989 Dependence on other enabling machines and devices: Secondary | ICD-10-CM

## 2019-05-09 DIAGNOSIS — J449 Chronic obstructive pulmonary disease, unspecified: Secondary | ICD-10-CM | POA: Diagnosis not present

## 2019-05-09 LAB — BASIC METABOLIC PANEL
Anion gap: 13 (ref 5–15)
BUN: 33 mg/dL — ABNORMAL HIGH (ref 6–20)
CO2: 25 mmol/L (ref 22–32)
Calcium: 9.1 mg/dL (ref 8.9–10.3)
Chloride: 106 mmol/L (ref 98–111)
Creatinine, Ser: 2.5 mg/dL — ABNORMAL HIGH (ref 0.61–1.24)
GFR calc Af Amer: 32 mL/min — ABNORMAL LOW (ref >60)
GFR calc non Af Amer: 28 mL/min — ABNORMAL LOW (ref >60)
Glucose, Bld: 157 mg/dL — ABNORMAL HIGH (ref 70–99)
Potassium: 3.8 mmol/L (ref 3.5–5.1)
Sodium: 144 mmol/L (ref 135–145)

## 2019-05-09 LAB — CBC
HCT: 40.7 % (ref 39.0–52.0)
Hemoglobin: 12.1 g/dL — ABNORMAL LOW (ref 13.0–17.0)
MCH: 27.9 pg (ref 26.0–34.0)
MCHC: 29.7 g/dL — ABNORMAL LOW (ref 30.0–36.0)
MCV: 93.8 fL (ref 80.0–100.0)
Platelets: 187 10*3/uL (ref 150–400)
RBC: 4.34 MIL/uL (ref 4.22–5.81)
RDW: 15.6 % — ABNORMAL HIGH (ref 11.5–15.5)
WBC: 5.3 10*3/uL (ref 4.0–10.5)
nRBC: 0 % (ref 0.0–0.2)

## 2019-05-09 LAB — PROTIME-INR
INR: 1.6 — ABNORMAL HIGH (ref 0.8–1.2)
Prothrombin Time: 19.3 seconds — ABNORMAL HIGH (ref 11.4–15.2)

## 2019-05-09 LAB — SARS CORONAVIRUS 2 (TAT 6-24 HRS): SARS Coronavirus 2: NEGATIVE

## 2019-05-09 MED ORDER — HYDRALAZINE HCL 100 MG PO TABS: 100.0000 mg | ORAL_TABLET | Freq: Three times a day (TID) | ORAL | 3 refills | Status: DC

## 2019-05-09 NOTE — Progress Notes (Signed)
Pt in for f/u EKG today, EKG with a-fib rvr HR 109, pt states he is much more SOB this am and feels like his abd is "tight"  Pt's wt is up 3 lbs from [facility] 2/11.  Pt states he has been taking all meds as prescribed including the Flecainide BID and states he took everything this morning.  His dccv is sch for next Mon 2/22.  Will discuss w/Dr Bensimhon

## 2019-05-09 NOTE — Progress Notes (Signed)
Advanced Heart Failure Clinic Note   Referring Physician: [name] Primary Care: Silvio Pate Primary Cardiologist: [name]  EP: Dr Rayann Heman HF MD: Dr Haroldine Laws   HPI: Phillip [name] is a 59 y.o. male hx of PAF, remote PE, hx of DVT, OSA, morbid obesity, CKD Stage III,  HTN, and  combined chronic systolic/diastolic. S/P A Flutter ablation May 2019 .    EF in 2015 40-45% Echo 12/21/16 with EF 55-60% G2DD, mild aortic regurgitation. Last nuc 2008 no ischemia, no hx of cath due to CKD.   Developed AFL and underwent AFL ablation in 5/19.  I saw him last on 10/11/18 and was feeling bad. More SOB. Having some pressure in his chest and arm. + LE edema. Found to be in AFL/AF in 80s. Echo at that time with EF back down to 30%. Underwent DC-CV on 10/14/18 and referred to AF Clinic.   Developed recurrent AF at the end of January 2021. Underwent attempt at [facility] on 04/22/19 but failed to convert. Seen back in AF Clinic on 2/5. Nota able to use Tikosyn due to long QT. Not ablation candidate due to obesity. Considered amiodarone.   Echo 11/20 EF 45-50%   I saw him last week and was back in symptomatic AF. D/w Dr. Rayann Heman and decision made to start flecainide. He presents today for repeat ECG. Feels more SOB and bloated. Has tingling sensation up his left neck. Feels like his abd is "tight"  Pt's wt is up 3 lbs from [facility] 2/11. No syncope or presyncope. Taking Xarelto without bleeding.    Review of systems complete and found to be negative unless listed in HPI.    Past Medical History:  Diagnosis Date  . CHF (congestive heart failure) ([place]) 11/15   EF 40-45%, improved with follow-up  . COPD (chronic obstructive pulmonary disease) ([place])   . Gout   . History of DVT (deep vein thrombosis)   . Hypertension   . Hypertensive cardiovascular disease   . Kidney dysfunction   . Morbid obesity ([place])   . OSA on CPAP   . Paroxysmal atrial fibrillation (H[facility])   . PNA (pneumonia)   . Pulmonary embolism ([place]) 2010   left  leg DVT   Current Outpatient Medications  Medication Sig Dispense Refill  . albuterol (PROAIR HFA) 108 (90 Base) MCG/ACT inhaler Inhale 2 puffs into the lungs every 4 (four) hours as needed for wheezing or shortness of breath. 1 Inhaler 3  . albuterol (PROVENTIL) (2.5 MG/3ML) 0.083% nebulizer solution Take 3 mLs (2.5 mg total) by nebulization every 6 (six) hours as needed for wheezing or shortness of breath. 360 mL 11  . allopurinol (ZYLOPRIM) 300 MG tablet Take 300 mg by mouth daily.    . budesonide-formoterol (SYMBICORT) 80-4.5 MCG/ACT inhaler Inhale 2 puffs into the lungs 2 (two) times daily.    . carvedilol (COREG) 6.25 MG tablet Take 6.25 mg by mouth 2 (two) times daily with a meal.    . cloNIDine (CATAPRES) 0.1 MG tablet Take 0.1 mg by mouth 3 (three) times daily.    . colchicine 0.6 MG tablet Take 1 tablet (0.6 mg total) by mouth daily as needed (gout). 30 tablet 1  . famotidine (PEPCID) 20 MG tablet Take 20 mg by mouth at bedtime.    . flecainide (TAMBOCOR) 100 MG tablet Take 1 tablet (100 mg total) by mouth 2 (two) times daily. 60 tablet 3  . hydrALAZINE (APRESOLINE) 100 MG tablet Take 1 tablet (100 mg total) by mouth 3 (three)  times daily. 90 tablet 3  . montelukast (SINGULAIR) 10 MG tablet Take 10 mg by mouth at bedtime.    . naproxen sodium (ALEVE) 220 MG tablet Take 440 mg by mouth 2 (two) times daily as needed (pain).    . OXYGEN Inhale 2 L into the lungs daily as needed (low oxygen).    [name] oxymetazoline (AFRIN NASAL SPRAY) 0.05 % nasal spray Place 1 spray into both nostrils 2 (two) times daily as needed for congestion.    . pantoprazole (PROTONIX) 40 MG tablet Take 40 mg by mouth 2 (two) times daily before a meal.    . potassium chloride SA (KLOR-CON M20) 20 MEQ tablet Take 2 tablets (40 mEq total) by mouth every morning AND 1 tablet (20 mEq total) every evening. 270 tablet 3  . rivaroxaban (XARELTO) 20 MG TABS tablet Take 20 mg by mouth daily with supper.    . torsemide (DEMADEX)  20 MG tablet Take 3 tablets (60 mg total) by mouth 2 (two) times daily. Take extra 1 tablet for weight gain (Patient taking differently: Take 60 mg by mouth 2 (two) times daily. Take extra 20 mg tablet for weight gain) 200 tablet 3  . UNABLE TO FIND Med Name: CPAP with sleep     No current facility-administered medications for this encounter.   Allergies  Allergen Reactions  . Other Anaphylaxis    mushrooms    Social History   Socioeconomic History  . Marital status: Divorced    Spouse name: Not on file  . Number of children: 2  . Years of education: 81  . Highest education level: Not on file  Occupational History  . Occupation: Drives dump truck    Comment:    . Occupation: Airline pilot    Comment: Disabled  Tobacco Use  . Smoking status: Former Smoker    Packs/day: 0.25    Years: 1.00    Pack years: 0.25    Types: Cigarettes    Quit date: 07/23/1989    Years since quitting: 29.8  . Smokeless tobacco: Never Used  Substance and Sexual Activity  . Alcohol use: No    Alcohol/week: 0.0 standard drinks  . Drug use: No  . Sexual activity: Not Currently  Other Topics Concern  . Not on file  Social History Narrative   Pt lives in [place], alone.   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   Has 2 sons in [place]      No living will   Would want sons to make decisions for him   Would accept resuscitation   Social Determinants of Health   Financial Resource Strain:   . Difficulty of Paying Living Expenses: Not on file  Food Insecurity:   . Worried About Charity fundraiser in the Last Year: Not on file  . Ran Out of Food in the Last Year: Not on file  Transportation Needs:   . Lack of Transportation (Medical): Not on file  . Lack of Transportation (Non-Medical): Not on file  Physical Activity:   . Days of Exercise per Week: Not on file  . Minutes of Exercise per Session: Not on file  Stress:   . Feeling of Stress : Not on file  Social  Connections:   . Frequency of Communication with Friends and Family: Not on file  . Frequency of Social Gatherings with Friends and Family: Not on file  . Attends Religious Services: Not on file  .  Active Member of Clubs or Organizations: Not on file  . Attends Archivist Meetings: Not on file  . Marital Status: Not on file  Intimate Partner Violence:   . Fear of Current or Ex-Partner: Not on file  . Emotionally Abused: Not on file  . Physically Abused: Not on file  . Sexually Abused: Not on file    Family History  Problem Relation Age of Onset  . Hypertension Mother   . Diabetes Father   . CAD Father   . Stomach cancer Father   . Clotting disorder Father   . Heart disease Father        transplant  . Allergies Brother    Vitals:   05/09/19 0956  BP: (!) 160/100  Pulse: 100  SpO2: 97%  Weight: (!) 176 kg (388 lb)   Wt Readings from Last 3 Encounters:  05/09/19 (!) 176 kg (388 lb)  05/05/19 (!) 174.7 kg (385 lb 2 oz)  05/02/19 (!) 174.9 kg (385 lb 9.6 oz)   PHYSICAL EXAM: General:  Obese male. Stressed out HEENT: normal Neck: supple. JVD hard to see. Carotids 2+ bilat; no bruits. No lymphadenopathy or thryomegaly appreciated. Cor: PMI nondisplaced. Irregular tachy . No rubs, gallops or murmurs. Lungs: clear Abdomen: obese soft, nontender, nondistended. No hepatosplenomegaly. No bruits or masses. Good bowel sounds. Extremities: no cyanosis, clubbing, rash, tr edema Neuro: alert & orientedx3, cranial nerves grossly intact. moves all 4 extremities w/o difficulty. Affect pleasant    ECG AF 109 IVCD (intervals otherwise ok) Diffuse non-specific TWI Personally reviewed   ASSESSMENT & PLAN:  1. 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 - EF 7/20 back down to 30%. Suspect due to AF but rate not overly elevated. HTN may also be playing a role. - Symptoms improved after DC-CV on 10/11/18  - Echo 11/20 EF 45-50% - Now NYHA III  in setting of recurrent AF  - Volume status slightly elevated - Continue torsemide 60 bid for now. (Will increase to 80 bid for 1 day) - Continue hydralazine 75 tid and carvedilol 6.25 bid. - Has not had cath due to CKD and lack of clear anginal symptoms. Suspect neck tingling not ischemia but more related to AF and anxiety) - No ACE/ARB with CKD  2. PAF / Typical A-flutter, paroxysmal - S/P AFL ablation 07/2017. - s/p DC-CV of AFib on 10/14/18 - back in AF today  - Continue Xarelto 20 mg daily.  - No bleeding issues.   - He is compliant with CPAP. Needs weight loss - Has been seen in AF Clinic and felt not to be candidate for RFA. - I d/w Dr. Rayann Heman and AF Clinic. Not candidate for Tikosyn with CKD. Prefer not to use amio with young age. Think risks/benefits of flecainide is best choice.  - Continues to be more symptomatic with AF. We have bumped DC-CV up to tomorrow am. If feeling worse overnight come to ER  3. Hypertension - BP is always  high here but says it is well controlled at home. Will need to follow closely - May need Paramedicine to assure compliance  6. Morbid obesity - Body mass index is 57.3 kg/m.  - Desperately needs weight loss  5. CKD IV - Creatinine baseline ~2.0-2.3 - Creatinine 2.5 today  6. OSA on CPAP - reports compliance - needs weight loss    Glori Bickers, MD  12:54 PM

## 2019-05-09 NOTE — Patient Instructions (Addendum)
Increase Torsemide to 80 mg (4 tabs) Twice daily FOR TODAY ONLY  Your physician has recommended that you have a Cardioversion (DCCV). Electrical Cardioversion uses a jolt of electricity to your heart either through paddles or wired patches attached to your chest. This is a controlled, usually prescheduled, procedure. Defibrillation is done under light anesthesia in the hospital, and you usually go home the day of the procedure. This is done to get your heart back into a normal rhythm. You are not awake for the procedure. Please see the instruction sheet given to you today.  Tomorrow 05/10/19  Please go for COVID testing as soon as you leave here Nothing to eat or drink after midnight today, except water with your medications Can hold your Torsemide tomorrow AM Make sure to take your Xarelto

## 2019-05-09 NOTE — H&P (View-Only) (Signed)
Advanced Heart Failure Clinic Note   Referring Physician: [name] Primary Care: Silvio Pate Primary Cardiologist: [name]  EP: Dr Rayann Heman HF MD: Dr Haroldine Laws   HPI: Phillip [name] is a 59 y.o. male hx of PAF, remote PE, hx of DVT, OSA, morbid obesity, CKD Stage III,  HTN, and  combined chronic systolic/diastolic. S/P A Flutter ablation May 2019 .    EF in 2015 40-45% Echo 12/21/16 with EF 55-60% G2DD, mild aortic regurgitation. Last nuc 2008 no ischemia, no hx of cath due to CKD.   Developed AFL and underwent AFL ablation in 5/19.  I saw him last on 10/11/18 and was feeling bad. More SOB. Having some pressure in his chest and arm. + LE edema. Found to be in AFL/AF in 80s. Echo at that time with EF back down to 30%. Underwent DC-CV on 10/14/18 and referred to AF Clinic.   Developed recurrent AF at the end of January 2021. Underwent attempt at [facility] on 04/22/19 but failed to convert. Seen back in AF Clinic on 2/5. Nota able to use Tikosyn due to long QT. Not ablation candidate due to obesity. Considered amiodarone.   Echo 11/20 EF 45-50%   I saw him last week and was back in symptomatic AF. D/w Dr. Rayann Heman and decision made to start flecainide. He presents today for repeat ECG. Feels more SOB and bloated. Has tingling sensation up his left neck. Feels like his abd is "tight"  Pt's wt is up 3 lbs from [facility] 2/11. No syncope or presyncope. Taking Xarelto without bleeding.    Review of systems complete and found to be negative unless listed in HPI.    Past Medical History:  Diagnosis Date  . CHF (congestive heart failure) ([place]) 11/15   EF 40-45%, improved with follow-up  . COPD (chronic obstructive pulmonary disease) ([place])   . Gout   . History of DVT (deep vein thrombosis)   . Hypertension   . Hypertensive cardiovascular disease   . Kidney dysfunction   . Morbid obesity ([place])   . OSA on CPAP   . Paroxysmal atrial fibrillation (H[facility])   . PNA (pneumonia)   . Pulmonary embolism ([place]) 2010   left  leg DVT   Current Outpatient Medications  Medication Sig Dispense Refill  . albuterol (PROAIR HFA) 108 (90 Base) MCG/ACT inhaler Inhale 2 puffs into the lungs every 4 (four) hours as needed for wheezing or shortness of breath. 1 Inhaler 3  . albuterol (PROVENTIL) (2.5 MG/3ML) 0.083% nebulizer solution Take 3 mLs (2.5 mg total) by nebulization every 6 (six) hours as needed for wheezing or shortness of breath. 360 mL 11  . allopurinol (ZYLOPRIM) 300 MG tablet Take 300 mg by mouth daily.    . budesonide-formoterol (SYMBICORT) 80-4.5 MCG/ACT inhaler Inhale 2 puffs into the lungs 2 (two) times daily.    . carvedilol (COREG) 6.25 MG tablet Take 6.25 mg by mouth 2 (two) times daily with a meal.    . cloNIDine (CATAPRES) 0.1 MG tablet Take 0.1 mg by mouth 3 (three) times daily.    . colchicine 0.6 MG tablet Take 1 tablet (0.6 mg total) by mouth daily as needed (gout). 30 tablet 1  . famotidine (PEPCID) 20 MG tablet Take 20 mg by mouth at bedtime.    . flecainide (TAMBOCOR) 100 MG tablet Take 1 tablet (100 mg total) by mouth 2 (two) times daily. 60 tablet 3  . hydrALAZINE (APRESOLINE) 100 MG tablet Take 1 tablet (100 mg total) by mouth 3 (three)  times daily. 90 tablet 3  . montelukast (SINGULAIR) 10 MG tablet Take 10 mg by mouth at bedtime.    . naproxen sodium (ALEVE) 220 MG tablet Take 440 mg by mouth 2 (two) times daily as needed (pain).    . OXYGEN Inhale 2 L into the lungs daily as needed (low oxygen).    [name] oxymetazoline (AFRIN NASAL SPRAY) 0.05 % nasal spray Place 1 spray into both nostrils 2 (two) times daily as needed for congestion.    . pantoprazole (PROTONIX) 40 MG tablet Take 40 mg by mouth 2 (two) times daily before a meal.    . potassium chloride SA (KLOR-CON M20) 20 MEQ tablet Take 2 tablets (40 mEq total) by mouth every morning AND 1 tablet (20 mEq total) every evening. 270 tablet 3  . rivaroxaban (XARELTO) 20 MG TABS tablet Take 20 mg by mouth daily with supper.    . torsemide (DEMADEX)  20 MG tablet Take 3 tablets (60 mg total) by mouth 2 (two) times daily. Take extra 1 tablet for weight gain (Patient taking differently: Take 60 mg by mouth 2 (two) times daily. Take extra 20 mg tablet for weight gain) 200 tablet 3  . UNABLE TO FIND Med Name: CPAP with sleep     No current facility-administered medications for this encounter.   Allergies  Allergen Reactions  . Other Anaphylaxis    mushrooms    Social History   Socioeconomic History  . Marital status: Divorced    Spouse name: Not on file  . Number of children: 2  . Years of education: 86  . Highest education level: Not on file  Occupational History  . Occupation: Drives dump truck    Comment:    . Occupation: Airline pilot    Comment: Disabled  Tobacco Use  . Smoking status: Former Smoker    Packs/day: 0.25    Years: 1.00    Pack years: 0.25    Types: Cigarettes    Quit date: 07/23/1989    Years since quitting: 29.8  . Smokeless tobacco: Never Used  Substance and Sexual Activity  . Alcohol use: No    Alcohol/week: 0.0 standard drinks  . Drug use: No  . Sexual activity: Not Currently  Other Topics Concern  . Not on file  Social History Narrative   Pt lives in [place], alone.   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   Has 2 sons in [place]      No living will   Would want sons to make decisions for him   Would accept resuscitation   Social Determinants of Health   Financial Resource Strain:   . Difficulty of Paying Living Expenses: Not on file  Food Insecurity:   . Worried About Charity fundraiser in the Last Year: Not on file  . Ran Out of Food in the Last Year: Not on file  Transportation Needs:   . Lack of Transportation (Medical): Not on file  . Lack of Transportation (Non-Medical): Not on file  Physical Activity:   . Days of Exercise per Week: Not on file  . Minutes of Exercise per Session: Not on file  Stress:   . Feeling of Stress : Not on file  Social  Connections:   . Frequency of Communication with Friends and Family: Not on file  . Frequency of Social Gatherings with Friends and Family: Not on file  . Attends Religious Services: Not on file  .  Active Member of Clubs or Organizations: Not on file  . Attends Archivist Meetings: Not on file  . Marital Status: Not on file  Intimate Partner Violence:   . Fear of Current or Ex-Partner: Not on file  . Emotionally Abused: Not on file  . Physically Abused: Not on file  . Sexually Abused: Not on file    Family History  Problem Relation Age of Onset  . Hypertension Mother   . Diabetes Father   . CAD Father   . Stomach cancer Father   . Clotting disorder Father   . Heart disease Father        transplant  . Allergies Brother    Vitals:   05/09/19 0956  BP: (!) 160/100  Pulse: 100  SpO2: 97%  Weight: (!) 176 kg (388 lb)   Wt Readings from Last 3 Encounters:  05/09/19 (!) 176 kg (388 lb)  05/05/19 (!) 174.7 kg (385 lb 2 oz)  05/02/19 (!) 174.9 kg (385 lb 9.6 oz)   PHYSICAL EXAM: General:  Obese male. Stressed out HEENT: normal Neck: supple. JVD hard to see. Carotids 2+ bilat; no bruits. No lymphadenopathy or thryomegaly appreciated. Cor: PMI nondisplaced. Irregular tachy . No rubs, gallops or murmurs. Lungs: clear Abdomen: obese soft, nontender, nondistended. No hepatosplenomegaly. No bruits or masses. Good bowel sounds. Extremities: no cyanosis, clubbing, rash, tr edema Neuro: alert & orientedx3, cranial nerves grossly intact. moves all 4 extremities w/o difficulty. Affect pleasant    ECG AF 109 IVCD (intervals otherwise ok) Diffuse non-specific TWI Personally reviewed   ASSESSMENT & PLAN:  1. 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 - EF 7/20 back down to 30%. Suspect due to AF but rate not overly elevated. HTN may also be playing a role. - Symptoms improved after DC-CV on 10/11/18  - Echo 11/20 EF 45-50% - Now NYHA III  in setting of recurrent AF  - Volume status slightly elevated - Continue torsemide 60 bid for now. (Will increase to 80 bid for 1 day) - Continue hydralazine 75 tid and carvedilol 6.25 bid. - Has not had cath due to CKD and lack of clear anginal symptoms. Suspect neck tingling not ischemia but more related to AF and anxiety) - No ACE/ARB with CKD  2. PAF / Typical A-flutter, paroxysmal - S/P AFL ablation 07/2017. - s/p DC-CV of AFib on 10/14/18 - back in AF today  - Continue Xarelto 20 mg daily.  - No bleeding issues.   - He is compliant with CPAP. Needs weight loss - Has been seen in AF Clinic and felt not to be candidate for RFA. - I d/w Dr. Rayann Heman and AF Clinic. Not candidate for Tikosyn with CKD. Prefer not to use amio with young age. Think risks/benefits of flecainide is best choice.  - Continues to be more symptomatic with AF. We have bumped DC-CV up to tomorrow am. If feeling worse overnight come to ER  3. Hypertension - BP is always  high here but says it is well controlled at home. Will need to follow closely - May need Paramedicine to assure compliance  6. Morbid obesity - Body mass index is 57.3 kg/m.  - Desperately needs weight loss  5. CKD IV - Creatinine baseline ~2.0-2.3 - Creatinine 2.5 today  6. OSA on CPAP - reports compliance - needs weight loss    Glori Bickers, MD  12:54 PM

## 2019-05-10 ENCOUNTER — Ambulatory Visit (HOSPITAL_COMMUNITY): Payer: Managed Care, Other (non HMO) | Admitting: Anesthesiology

## 2019-05-10 ENCOUNTER — Encounter (HOSPITAL_COMMUNITY)
Admission: RE | Disposition: A | Discharge status: Home / Self Care | Payer: Self-pay | Source: Home / Self Care | Attending: Internal Medicine

## 2019-05-10 ENCOUNTER — Ambulatory Visit (HOSPITAL_BASED_OUTPATIENT_CLINIC_OR_DEPARTMENT_OTHER)
Admission: RE | Admit: 2019-05-10 | Discharge: 2019-05-10 | Disposition: A | Discharge status: Home / Self Care | Payer: Managed Care, Other (non HMO) | Source: Home / Self Care | Attending: Internal Medicine | Admitting: Internal Medicine

## 2019-05-10 ENCOUNTER — Encounter (HOSPITAL_COMMUNITY): Payer: Self-pay | Admitting: Internal Medicine

## 2019-05-10 DIAGNOSIS — I4891 Unspecified atrial fibrillation: Secondary | ICD-10-CM | POA: Diagnosis not present

## 2019-05-10 DIAGNOSIS — G4733 Obstructive sleep apnea (adult) (pediatric): Secondary | ICD-10-CM | POA: Insufficient documentation

## 2019-05-10 DIAGNOSIS — Z7951 Long term (current) use of inhaled steroids: Secondary | ICD-10-CM | POA: Insufficient documentation

## 2019-05-10 DIAGNOSIS — M109 Gout, unspecified: Secondary | ICD-10-CM | POA: Insufficient documentation

## 2019-05-10 DIAGNOSIS — I5043 Acute on chronic combined systolic (congestive) and diastolic (congestive) heart failure: Secondary | ICD-10-CM | POA: Insufficient documentation

## 2019-05-10 DIAGNOSIS — J449 Chronic obstructive pulmonary disease, unspecified: Secondary | ICD-10-CM | POA: Insufficient documentation

## 2019-05-10 DIAGNOSIS — Z86711 Personal history of pulmonary embolism: Secondary | ICD-10-CM | POA: Insufficient documentation

## 2019-05-10 DIAGNOSIS — Z79899 Other long term (current) drug therapy: Secondary | ICD-10-CM | POA: Insufficient documentation

## 2019-05-10 DIAGNOSIS — I129 Hypertensive chronic kidney disease with stage 1 through stage 4 chronic kidney disease, or unspecified chronic kidney disease: Secondary | ICD-10-CM | POA: Insufficient documentation

## 2019-05-10 DIAGNOSIS — Z8249 Family history of ischemic heart disease and other diseases of the circulatory system: Secondary | ICD-10-CM | POA: Insufficient documentation

## 2019-05-10 DIAGNOSIS — Z6841 Body Mass Index (BMI) 40.0 and over, adult: Secondary | ICD-10-CM | POA: Insufficient documentation

## 2019-05-10 DIAGNOSIS — N184 Chronic kidney disease, stage 4 (severe): Secondary | ICD-10-CM | POA: Insufficient documentation

## 2019-05-10 DIAGNOSIS — Z86718 Personal history of other venous thrombosis and embolism: Secondary | ICD-10-CM | POA: Insufficient documentation

## 2019-05-10 DIAGNOSIS — Z87891 Personal history of nicotine dependence: Secondary | ICD-10-CM | POA: Insufficient documentation

## 2019-05-10 DIAGNOSIS — Z7901 Long term (current) use of anticoagulants: Secondary | ICD-10-CM | POA: Insufficient documentation

## 2019-05-10 DIAGNOSIS — I4892 Unspecified atrial flutter: Secondary | ICD-10-CM | POA: Insufficient documentation

## 2019-05-10 DIAGNOSIS — I480 Paroxysmal atrial fibrillation: Secondary | ICD-10-CM | POA: Insufficient documentation

## 2019-05-10 HISTORY — PX: CARDIOVERSION: SHX1299

## 2019-05-10 SURGERY — CARDIOVERSION
Anesthesia: General

## 2019-05-10 MED ORDER — PHENYLEPHRINE 40 MCG/ML (10ML) SYRINGE FOR IV PUSH (FOR BLOOD PRESSURE SUPPORT)
PREFILLED_SYRINGE | INTRAVENOUS | Status: DC | PRN
  Administered 2019-05-10 (×2): 120 ug via INTRAVENOUS
  Administered 2019-05-10: 80 ug via INTRAVENOUS

## 2019-05-10 MED ORDER — LIDOCAINE 2% (20 MG/ML) 5 ML SYRINGE
INTRAMUSCULAR | Status: DC | PRN
  Administered 2019-05-10: 100 mg via INTRAVENOUS

## 2019-05-10 MED ORDER — SODIUM CHLORIDE 0.9 % IV SOLN: INTRAVENOUS | Status: DC

## 2019-05-10 MED ORDER — PROPOFOL 10 MG/ML IV BOLUS
INTRAVENOUS | Status: DC | PRN
  Administered 2019-05-10: 140 mg via INTRAVENOUS

## 2019-05-10 NOTE — Anesthesia Postprocedure Evaluation (Signed)
Anesthesia Post Note  Patient: Phillip [name]  Procedure(s) Performed: CARDIOVERSION (N/A )     Patient location during evaluation: Endoscopy Anesthesia Type: General Level of consciousness: awake and alert Pain management: pain level controlled Vital Signs Assessment: post-procedure vital signs reviewed and stable Respiratory status: spontaneous breathing, nonlabored ventilation and respiratory function stable Cardiovascular status: blood pressure returned to baseline and stable Postop Assessment: no apparent nausea or vomiting Anesthetic complications: no    Last Vitals:  Vitals:   05/10/19 0900 05/10/19 0910  BP: (!) 98/50 (!) 104/54  Pulse: (!) 54   Resp: (!) 27 (!) 21  Temp:    SpO2: 95% 97%    Last Pain:  Vitals:   05/10/19 0910  TempSrc:   PainSc: 0-No pain                 Lynda Rainwater

## 2019-05-10 NOTE — Transfer of Care (Signed)
Immediate Anesthesia Transfer of Care Note  Patient: Phillip [name]  Procedure(s) Performed: CARDIOVERSION (N/A )  Patient Location: Endoscopy Unit  Anesthesia Type:General  Level of Consciousness: awake, alert  and oriented  Airway & Oxygen Therapy: Patient Spontanous Breathing  Post-op Assessment: Report given to RN and Post -op Vital signs reviewed and stable  Post vital signs: Reviewed and stable  Last Vitals:  Vitals Value Taken Time  BP    Temp    Pulse    Resp    SpO2      Last Pain:  Vitals:   05/10/19 0746  TempSrc: Oral  PainSc: 0-No pain         Complications: No apparent anesthesia complications

## 2019-05-10 NOTE — Discharge Instructions (Signed)
Electrical Cardioversion   What can I expect after the procedure?  Your blood pressure, heart rate, breathing rate, and blood oxygen level will be monitored until you leave the hospital or clinic.  Your heart rhythm will be watched to make sure it does not change.  You may have some redness on the skin where the shocks were given. Follow these instructions at home:  Do not drive for 24 hours if you were given a sedative during your procedure.  Take over-the-counter and prescription medicines only as told by your health care provider.  Ask your health care provider how to check your pulse. Check it often.  Rest for 48 hours after the procedure or as told by your health care provider.  Avoid or limit your caffeine use as told by your health care provider.  Keep all follow-up visits as told by your health care provider. This is important. Contact a health care provider if:  You feel like your heart is beating too quickly or your pulse is not regular.  You have a serious muscle cramp that does not go away. Get help right away if:  You have discomfort in your chest.  You are dizzy or you feel faint.  You have trouble breathing or you are short of breath.  Your speech is slurred.  You have trouble moving an arm or leg on one side of your body.  Your fingers or toes turn cold or blue. Summary  Electrical cardioversion is the delivery of a jolt of electricity to restore a normal rhythm to the heart.  This procedure may be done right away in an emergency or may be a scheduled procedure if the condition is not an emergency.  Generally, this is a safe procedure.  After the procedure, check your pulse often as told by your health care provider. This information is not intended to replace advice given to you by your health care provider. Make sure you discuss any questions you have with your health care provider. Document Revised: 10/11/2018 Document Reviewed:  10/11/2018 Elsevier Patient Education  2020 [employer]  

## 2019-05-10 NOTE — Interval H&P Note (Signed)
History and Physical Interval Note:  05/10/2019 8:30 AM  Phillip [name]  has presented today for surgery, with the diagnosis of afib.  The various methods of treatment have been discussed with the patient and family. After consideration of risks, benefits and other options for treatment, the patient has consented to  Procedure(s): CARDIOVERSION (N/A) as a surgical intervention.  The patient's history has been reviewed, patient examined, no change in status, stable for surgery.  I have reviewed the patient's chart and labs.  Questions were answered to the patient's satisfaction.     [name]

## 2019-05-10 NOTE — CV Procedure (Signed)
    DIRECT CURRENT CARDIOVERSION  [name]   MRN: [id number] DOB:  1960/05/15   ADMIT DATE: 05/10/2019   INDICATIONS: Atrial fibrillation    PROCEDURE:   Informed consent was obtained prior to the procedure. The risks, benefits and alternatives for the procedure were discussed and the patient comprehended these risks. Once an appropriate time out was taken, the patient had the defibrillator pads placed in the anterior and posterior position. The patient then underwent sedation by the anesthesia service. Once an appropriate level of sedation was achieved, the patient received a single biphasic, synchronized 200J shock with prompt conversion to sinus rhythm. No apparent complications.  Glori Bickers, MD  9:26 AM

## 2019-05-10 NOTE — Anesthesia Procedure Notes (Signed)
Procedure Name: General with mask airway Date/Time: 05/10/2019 8:37 AM Performed by: Candis Shine, CRNA Pre-anesthesia Checklist: Patient identified, Emergency Drugs available, Suction available, Patient being monitored and Timeout performed Patient Re-evaluated:Patient Re-evaluated prior to induction Oxygen Delivery Method: Ambu bag Preoxygenation: Pre-oxygenation with 100% oxygen Induction Type: IV induction Dental Injury: Teeth and Oropharynx as per pre-operative assessment

## 2019-05-10 NOTE — Anesthesia Preprocedure Evaluation (Signed)
Anesthesia Evaluation  Patient identified by MRN, date of birth, ID band Patient awake    Reviewed: Allergy & Precautions, NPO status , Patient's Chart, lab work & pertinent test results  Airway Mallampati: I  TM Distance: >3 FB Neck ROM: Full    Dental no notable dental hx.    Pulmonary asthma , sleep apnea and Continuous Positive Airway Pressure Ventilation , COPD,  COPD inhaler, former smoker, PE Home oxygen prn   Pulmonary exam normal breath sounds clear to auscultation       Cardiovascular hypertension, Pt. on medications and Pt. on home beta blockers +CHF and + DVT  Normal cardiovascular exam+ dysrhythmias Atrial Fibrillation  Rhythm:Regular Rate:Normal  ECHO: 1. Left ventricular ejection fraction, by visual estimation, is 45 to 50%. The left ventricle has normal function. There is mildly increased left ventricular hypertrophy. 2. Mid inferolateral segment is abnormal. 3. Left ventricular diastolic parameters are consistent with Grade II diastolic dysfunction (pseudonormalization). 4. Severely dilated left ventricular internal cavity size. 5. Global right ventricle has normal systolic function.The right ventricular size is normal. No increase in right ventricular wall thickness. 6. Left atrial size was mildly dilated. 7. Right atrial size was normal. 8. The mitral valve is normal in structure. Mild mitral valve regurgitation. No evidence of mitral stenosis. 9. The tricuspid valve is normal in structure. Tricuspid valve regurgitation is not demonstrated. 10. Aortic regurgitation PHT measures 221 msec. 11. Aortic valve regurgitation is moderate to severe. 12. The aortic valve is normal in structure. Aortic valve regurgitation is moderate to severe. No evidence of aortic valve sclerosis or stenosis. 13. The pulmonic valve was normal in structure. Pulmonic valve regurgitation is not visualized. 14. The inferior vena cava is  normal in size with greater than 50% respiratory variability, suggesting right atrial pressure of 3 mmHg. 15. The average left ventricular global longitudinal strain is -17.1 %. In comparison to the previous echocardiogram(s): Prior examinations were reviewed in a side by side comparison of images. EF has improved from 20-25%.   Neuro/Psych negative neurological ROS  negative psych ROS   GI/Hepatic Neg liver ROS, GERD  Medicated and Controlled,  Endo/Other  Morbid obesity ([facility])  Renal/GU Renal disease     Musculoskeletal Gout   Abdominal (+) + obese,   Peds  Hematology negative hematology ROS (+)   Anesthesia Other Findings ATRIAL FIBRILLATION  Reproductive/Obstetrics                             Anesthesia Physical  Anesthesia Plan  ASA: IV  Anesthesia Plan: General   Post-op Pain Management:    Induction: Intravenous  PONV Risk Score and Plan: 2 and Treatment may vary due to age or medical condition and Propofol infusion  Airway Management Planned: Mask  Additional Equipment:   Intra-op Plan:   Post-operative Plan:   Informed Consent: I have reviewed the patients History and Physical, chart, labs and discussed the procedure including the risks, benefits and alternatives for the proposed anesthesia with the patient or authorized representative who has indicated his/her understanding and acceptance.     Dental advisory given  Plan Discussed with: CRNA  Anesthesia Plan Comments:         Anesthesia Quick Evaluation

## 2019-05-11 ENCOUNTER — Telehealth: Payer: Self-pay | Admitting: Internal Medicine

## 2019-05-11 ENCOUNTER — Encounter: Payer: Self-pay | Admitting: Family Medicine

## 2019-05-11 ENCOUNTER — Telehealth: Payer: Self-pay

## 2019-05-11 ENCOUNTER — Other Ambulatory Visit: Payer: Self-pay | Admitting: Internal Medicine

## 2019-05-11 ENCOUNTER — Ambulatory Visit (INDEPENDENT_AMBULATORY_CARE_PROVIDER_SITE_OTHER): Payer: Managed Care, Other (non HMO) | Admitting: Family Medicine

## 2019-05-11 VITALS — BP 128/78 | HR 90 | Temp 99.9°F | Wt 383.0 lb

## 2019-05-11 DIAGNOSIS — J441 Chronic obstructive pulmonary disease with (acute) exacerbation: Secondary | ICD-10-CM | POA: Diagnosis not present

## 2019-05-11 MED ORDER — PREDNISONE 20 MG PO TABS: ORAL_TABLET | ORAL | 0 refills | Status: DC

## 2019-05-11 MED ORDER — AMOXICILLIN-POT CLAVULANATE 875-125 MG PO TABS: 1.0000 | ORAL_TABLET | Freq: Two times a day (BID) | ORAL | 0 refills | Status: DC

## 2019-05-11 NOTE — Telephone Encounter (Signed)
Pt said starting today has temp 99.9; no chills or CP. Pt has prod cough with yellow slightly blood tinged phlegm, SOB upon exertion, runny nose, with some wheezing and tightness in chest. pts legs feel weak but not having any difficulty walking. Pt requesting abx and prednisone. No other covid symptoms and no travel or known exposure to + covid. Pt spoke with pulmonology about neb treatment but did not discuss symptoms listed above. Pt thought would start with PCP. Dr Silvio Pate out of office and pt scheduled virtual with Dr Einar Pheasant today at 11:40; if needs face to face pt is OK with respiratory clinic or UC whatever Doctor suggest. UC & ED precautions given and pt voiced understanding. Pt will get vitals now.

## 2019-05-11 NOTE — Telephone Encounter (Signed)
See clinic note

## 2019-05-11 NOTE — Telephone Encounter (Signed)
Spoke with pt. He is wanting to know how far apart his breathing treatments need to be. Advised him that his prescription states he can take them q6h. Nothing further was needed.

## 2019-05-11 NOTE — Progress Notes (Signed)
I connected with [name] on 05/11/19 at 11:40 AM EST by video and verified that I am speaking with the correct person using two identifiers.   I discussed the limitations, risks, security and privacy concerns of performing an evaluation and management service by video and the availability of in person appointments. I also discussed with the patient that there may be a patient responsible charge related to this service. The patient expressed understanding and agreed to proceed.  Patient location: Home Provider Location: [place] Participants: Phillip [name] and Bashir Golomb   Subjective:     Phillip [name] is a 59 y.o. male presenting for Cough (yesterday-05/10/19, temp 99.9, legs feels weak, SOB.)     Cough This is a new problem. The current episode started yesterday. The cough is productive of purulent sputum and productive of blood-tinged sputum. Associated symptoms include a fever, nasal congestion and shortness of breath. Pertinent negatives include no chills, ear pain, headaches, myalgias, rhinorrhea or sweats.   No loss of taste or smell Sick contact no - lives alone  Hx of COPD exacerbation on 04/25/2019 with azithromycin and prednsione - notes improvement on the medication - seems like it came back  Also has CPAP machine - feels like when he uses this it will cause him to bring up some phlegm   Negative covid test on 05/09/2019, 1 day prior to symptoms  Review of Systems  Constitutional: Positive for fever. Negative for chills.  HENT: Negative for ear pain and rhinorrhea.   Respiratory: Positive for cough and shortness of breath.   Gastrointestinal: Positive for constipation. Negative for diarrhea, nausea and vomiting.  Musculoskeletal: Negative for arthralgias and myalgias.  Neurological: Positive for weakness. Negative for headaches.     Social History   Tobacco Use  Smoking Status Former Smoker  . Packs/day: 0.25  . Years: 1.00  . Pack years: 0.25    . Types: Cigarettes  . Quit date: 07/23/1989  . Years since quitting: 29.8  Smokeless Tobacco Never Used        Objective:   BP Readings from Last 3 Encounters:  05/11/19 128/78  05/10/19 (!) 104/54  05/09/19 (!) 160/100   Wt Readings from Last 3 Encounters:  05/11/19 (!) 383 lb (173.7 kg)  05/09/19 (!) 388 lb (176 kg)  05/05/19 (!) 385 lb 2 oz (174.7 kg)   BP 128/78 Comment: per patient  Pulse 90 Comment: per patient  Temp 99.9 F (37.7 C) Comment: per patient  Wt (!) 383 lb (173.7 kg) Comment: per patient  SpO2 94% Comment: per patient  BMI 56.56 kg/m    Physical Exam Constitutional:      Appearance: Normal appearance. He is not ill-appearing.  HENT:     Head: Normocephalic and atraumatic.     Right Ear: External ear normal.     Left Ear: External ear normal.  Eyes:     Conjunctiva/sclera: Conjunctivae normal.  Pulmonary:     Effort: Pulmonary effort is normal. No respiratory distress.  Neurological:     Mental Status: He is alert. Mental status is at baseline.  Psychiatric:        Mood and Affect: Mood normal.        Behavior: Behavior normal.        Thought Content: Thought content normal.        Judgment: Judgment normal.             Assessment & Plan:   Problem List Items Addressed  This Visit    None    Visit Diagnoses    COPD exacerbation ([place])    -  Primary   Relevant Medications   amoxicillin-clavulanate (AUGMENTIN) 875-125 MG tablet   predniSONE (DELTASONE) 20 MG tablet     Given recent treatment with azithromycin and return of symptoms will try augmentin. Advised pulm f/u if symptoms return quickly a second time.   Call the clinic for respiratory clinic appointment on Friday if symptoms not significantly improved.    Return if symptoms worsen or fail to improve.  Phillip Noe, MD

## 2019-05-13 ENCOUNTER — Encounter: Payer: Self-pay | Admitting: *Deleted

## 2019-05-13 ENCOUNTER — Other Ambulatory Visit (HOSPITAL_COMMUNITY): Payer: Managed Care, Other (non HMO)

## 2019-05-13 ENCOUNTER — Inpatient Hospital Stay (HOSPITAL_COMMUNITY)
Admission: EM | Admit: 2019-05-13 | Discharge: 2019-05-17 | DRG: 291 | Disposition: A | Discharge status: Home / Self Care | Payer: Managed Care, Other (non HMO) | Attending: Internal Medicine | Admitting: Internal Medicine

## 2019-05-13 ENCOUNTER — Emergency Department (HOSPITAL_COMMUNITY): Payer: Managed Care, Other (non HMO)

## 2019-05-13 ENCOUNTER — Other Ambulatory Visit: Payer: Self-pay

## 2019-05-13 DIAGNOSIS — R0602 Shortness of breath: Secondary | ICD-10-CM

## 2019-05-13 DIAGNOSIS — I4892 Unspecified atrial flutter: Secondary | ICD-10-CM | POA: Diagnosis present

## 2019-05-13 DIAGNOSIS — I10 Essential (primary) hypertension: Secondary | ICD-10-CM | POA: Diagnosis present

## 2019-05-13 DIAGNOSIS — Z79899 Other long term (current) drug therapy: Secondary | ICD-10-CM

## 2019-05-13 DIAGNOSIS — I5043 Acute on chronic combined systolic (congestive) and diastolic (congestive) heart failure: Secondary | ICD-10-CM | POA: Diagnosis present

## 2019-05-13 DIAGNOSIS — J449 Chronic obstructive pulmonary disease, unspecified: Secondary | ICD-10-CM | POA: Diagnosis present

## 2019-05-13 DIAGNOSIS — Z8249 Family history of ischemic heart disease and other diseases of the circulatory system: Secondary | ICD-10-CM | POA: Diagnosis not present

## 2019-05-13 DIAGNOSIS — E785 Hyperlipidemia, unspecified: Secondary | ICD-10-CM | POA: Diagnosis present

## 2019-05-13 DIAGNOSIS — Z86718 Personal history of other venous thrombosis and embolism: Secondary | ICD-10-CM

## 2019-05-13 DIAGNOSIS — N179 Acute kidney failure, unspecified: Secondary | ICD-10-CM | POA: Diagnosis present

## 2019-05-13 DIAGNOSIS — Z6841 Body Mass Index (BMI) 40.0 and over, adult: Secondary | ICD-10-CM | POA: Diagnosis not present

## 2019-05-13 DIAGNOSIS — Z20822 Contact with and (suspected) exposure to covid-19: Secondary | ICD-10-CM | POA: Diagnosis present

## 2019-05-13 DIAGNOSIS — G4733 Obstructive sleep apnea (adult) (pediatric): Secondary | ICD-10-CM | POA: Diagnosis present

## 2019-05-13 DIAGNOSIS — Z86711 Personal history of pulmonary embolism: Secondary | ICD-10-CM | POA: Diagnosis not present

## 2019-05-13 DIAGNOSIS — Z91018 Allergy to other foods: Secondary | ICD-10-CM

## 2019-05-13 DIAGNOSIS — Z9981 Dependence on supplemental oxygen: Secondary | ICD-10-CM | POA: Diagnosis not present

## 2019-05-13 DIAGNOSIS — Z7951 Long term (current) use of inhaled steroids: Secondary | ICD-10-CM | POA: Diagnosis not present

## 2019-05-13 DIAGNOSIS — I130 Hypertensive heart and chronic kidney disease with heart failure and stage 1 through stage 4 chronic kidney disease, or unspecified chronic kidney disease: Secondary | ICD-10-CM | POA: Diagnosis present

## 2019-05-13 DIAGNOSIS — K219 Gastro-esophageal reflux disease without esophagitis: Secondary | ICD-10-CM | POA: Diagnosis present

## 2019-05-13 DIAGNOSIS — Z9989 Dependence on other enabling machines and devices: Secondary | ICD-10-CM

## 2019-05-13 DIAGNOSIS — Z7901 Long term (current) use of anticoagulants: Secondary | ICD-10-CM

## 2019-05-13 DIAGNOSIS — N170 Acute kidney failure with tubular necrosis: Secondary | ICD-10-CM | POA: Diagnosis not present

## 2019-05-13 DIAGNOSIS — Z87891 Personal history of nicotine dependence: Secondary | ICD-10-CM

## 2019-05-13 DIAGNOSIS — Z7952 Long term (current) use of systemic steroids: Secondary | ICD-10-CM

## 2019-05-13 DIAGNOSIS — J9621 Acute and chronic respiratory failure with hypoxia: Secondary | ICD-10-CM | POA: Diagnosis present

## 2019-05-13 DIAGNOSIS — M109 Gout, unspecified: Secondary | ICD-10-CM | POA: Diagnosis present

## 2019-05-13 DIAGNOSIS — N183 Chronic kidney disease, stage 3 unspecified: Secondary | ICD-10-CM | POA: Diagnosis present

## 2019-05-13 DIAGNOSIS — I509 Heart failure, unspecified: Secondary | ICD-10-CM

## 2019-05-13 DIAGNOSIS — N1831 Chronic kidney disease, stage 3a: Secondary | ICD-10-CM | POA: Diagnosis not present

## 2019-05-13 DIAGNOSIS — I480 Paroxysmal atrial fibrillation: Secondary | ICD-10-CM | POA: Diagnosis present

## 2019-05-13 LAB — BASIC METABOLIC PANEL
Anion gap: 8 (ref 5–15)
BUN: 34 mg/dL — ABNORMAL HIGH (ref 6–20)
CO2: 28 mmol/L (ref 22–32)
Calcium: 8.9 mg/dL (ref 8.9–10.3)
Chloride: 107 mmol/L (ref 98–111)
Creatinine, Ser: 2.91 mg/dL — ABNORMAL HIGH (ref 0.61–1.24)
GFR calc Af Amer: 27 mL/min — ABNORMAL LOW (ref >60)
GFR calc non Af Amer: 23 mL/min — ABNORMAL LOW (ref >60)
Glucose, Bld: 106 mg/dL — ABNORMAL HIGH (ref 70–99)
Potassium: 4.4 mmol/L (ref 3.5–5.1)
Sodium: 143 mmol/L (ref 135–145)

## 2019-05-13 LAB — CBC WITH DIFFERENTIAL/PLATELET
Abs Immature Granulocytes: 0.01 10*3/uL (ref 0.00–0.07)
Basophils Absolute: 0 10*3/uL (ref 0.0–0.1)
Basophils Relative: 0 %
Eosinophils Absolute: 0.1 10*3/uL (ref 0.0–0.5)
Eosinophils Relative: 1 %
HCT: 38.4 % — ABNORMAL LOW (ref 39.0–52.0)
Hemoglobin: 11.4 g/dL — ABNORMAL LOW (ref 13.0–17.0)
Immature Granulocytes: 0 %
Lymphocytes Relative: 12 %
Lymphs Abs: 0.7 10*3/uL (ref 0.7–4.0)
MCH: 27.7 pg (ref 26.0–34.0)
MCHC: 29.7 g/dL — ABNORMAL LOW (ref 30.0–36.0)
MCV: 93.2 fL (ref 80.0–100.0)
Monocytes Absolute: 0.5 10*3/uL (ref 0.1–1.0)
Monocytes Relative: 8 %
Neutro Abs: 4.6 10*3/uL (ref 1.7–7.7)
Neutrophils Relative %: 79 %
Platelets: 150 10*3/uL (ref 150–400)
RBC: 4.12 MIL/uL — ABNORMAL LOW (ref 4.22–5.81)
RDW: 15.5 % (ref 11.5–15.5)
WBC: 5.8 10*3/uL (ref 4.0–10.5)
nRBC: 0 % (ref 0.0–0.2)

## 2019-05-13 LAB — TROPONIN I (HIGH SENSITIVITY)
Troponin I (High Sensitivity): 48 ng/L — ABNORMAL HIGH (ref <18)
Troponin I (High Sensitivity): 45 ng/L — ABNORMAL HIGH (ref <18)

## 2019-05-13 LAB — BRAIN NATRIURETIC PEPTIDE: B Natriuretic Peptide: 754 pg/mL — ABNORMAL HIGH (ref 0.0–100.0)

## 2019-05-13 MED ORDER — FUROSEMIDE 10 MG/ML IJ SOLN
80.0000 mg | Freq: Two times a day (BID) | INTRAMUSCULAR | Status: DC
  Administered 2019-05-14 – 2019-05-17 (×7): 80 mg via INTRAVENOUS
  Filled 2019-05-13 (×7): qty 8

## 2019-05-13 MED ORDER — PANTOPRAZOLE SODIUM 40 MG PO TBEC
40.0000 mg | DELAYED_RELEASE_TABLET | Freq: Two times a day (BID) | ORAL | Status: DC
  Administered 2019-05-14 – 2019-05-17 (×7): 40 mg via ORAL
  Filled 2019-05-13 (×6): qty 1

## 2019-05-13 MED ORDER — AMOXICILLIN-POT CLAVULANATE 875-125 MG PO TABS
1.0000 | ORAL_TABLET | Freq: Two times a day (BID) | ORAL | Status: DC
  Administered 2019-05-13: 1 via ORAL
  Filled 2019-05-13 (×2): qty 1

## 2019-05-13 MED ORDER — ACETAMINOPHEN 650 MG RE SUPP: 650.0000 mg | Freq: Four times a day (QID) | RECTAL | Status: DC | PRN

## 2019-05-13 MED ORDER — ONDANSETRON HCL 4 MG/2ML IJ SOLN: 4.0000 mg | Freq: Four times a day (QID) | INTRAMUSCULAR | Status: DC | PRN

## 2019-05-13 MED ORDER — OXYMETAZOLINE HCL 0.05 % NA SOLN: 1.0000 | Freq: Two times a day (BID) | NASAL | Status: DC | PRN

## 2019-05-13 MED ORDER — FUROSEMIDE 10 MG/ML IJ SOLN
80.0000 mg | Freq: Once | INTRAMUSCULAR | Status: AC
  Administered 2019-05-13: 80 mg via INTRAVENOUS
  Filled 2019-05-13: qty 8

## 2019-05-13 MED ORDER — RIVAROXABAN 20 MG PO TABS: 20.0000 mg | ORAL_TABLET | Freq: Every day | ORAL | Status: DC

## 2019-05-13 MED ORDER — HYDRALAZINE HCL 50 MG PO TABS
100.0000 mg | ORAL_TABLET | Freq: Three times a day (TID) | ORAL | Status: DC
  Administered 2019-05-13 – 2019-05-17 (×11): 100 mg via ORAL
  Filled 2019-05-13 (×11): qty 2

## 2019-05-13 MED ORDER — SENNOSIDES-DOCUSATE SODIUM 8.6-50 MG PO TABS
1.0000 | ORAL_TABLET | Freq: Every evening | ORAL | Status: DC | PRN
  Administered 2019-05-14 (×2): 1 via ORAL
  Filled 2019-05-13 (×2): qty 1

## 2019-05-13 MED ORDER — MONTELUKAST SODIUM 10 MG PO TABS
10.0000 mg | ORAL_TABLET | Freq: Every day | ORAL | Status: DC
  Administered 2019-05-13 – 2019-05-16 (×4): 10 mg via ORAL
  Filled 2019-05-13 (×4): qty 1

## 2019-05-13 MED ORDER — MOMETASONE FURO-FORMOTEROL FUM 100-5 MCG/ACT IN AERO
2.0000 | INHALATION_SPRAY | Freq: Two times a day (BID) | RESPIRATORY_TRACT | Status: DC
  Administered 2019-05-13 – 2019-05-17 (×8): 2 via RESPIRATORY_TRACT
  Filled 2019-05-13: qty 8.8

## 2019-05-13 MED ORDER — ACETAMINOPHEN 325 MG PO TABS
650.0000 mg | ORAL_TABLET | Freq: Four times a day (QID) | ORAL | Status: DC | PRN
  Administered 2019-05-14: 650 mg via ORAL
  Filled 2019-05-13: qty 2

## 2019-05-13 MED ORDER — FLECAINIDE ACETATE 100 MG PO TABS
100.0000 mg | ORAL_TABLET | Freq: Two times a day (BID) | ORAL | Status: DC
  Administered 2019-05-13 – 2019-05-17 (×8): 100 mg via ORAL
  Filled 2019-05-13 (×9): qty 1

## 2019-05-13 MED ORDER — ALBUTEROL SULFATE HFA 108 (90 BASE) MCG/ACT IN AERS: 2.0000 | INHALATION_SPRAY | RESPIRATORY_TRACT | Status: DC | PRN

## 2019-05-13 MED ORDER — ALLOPURINOL 300 MG PO TABS
150.0000 mg | ORAL_TABLET | Freq: Every day | ORAL | Status: DC
  Administered 2019-05-14 – 2019-05-17 (×4): 150 mg via ORAL
  Filled 2019-05-13 (×5): qty 1

## 2019-05-13 MED ORDER — ALBUTEROL SULFATE (2.5 MG/3ML) 0.083% IN NEBU: 2.5000 mg | INHALATION_SOLUTION | Freq: Four times a day (QID) | RESPIRATORY_TRACT | Status: DC | PRN

## 2019-05-13 MED ORDER — ONDANSETRON HCL 4 MG PO TABS: 4.0000 mg | ORAL_TABLET | Freq: Four times a day (QID) | ORAL | Status: DC | PRN

## 2019-05-13 MED ORDER — CARVEDILOL 6.25 MG PO TABS
6.2500 mg | ORAL_TABLET | Freq: Two times a day (BID) | ORAL | Status: DC
  Administered 2019-05-14 – 2019-05-17 (×7): 6.25 mg via ORAL
  Filled 2019-05-13 (×7): qty 1

## 2019-05-13 MED ORDER — FAMOTIDINE 20 MG PO TABS
20.0000 mg | ORAL_TABLET | Freq: Every day | ORAL | Status: DC
  Administered 2019-05-13 – 2019-05-16 (×4): 20 mg via ORAL
  Filled 2019-05-13 (×6): qty 1

## 2019-05-13 MED ORDER — CLONIDINE HCL 0.1 MG PO TABS
0.1000 mg | ORAL_TABLET | Freq: Three times a day (TID) | ORAL | Status: DC
  Administered 2019-05-13 – 2019-05-17 (×11): 0.1 mg via ORAL
  Filled 2019-05-13 (×11): qty 1

## 2019-05-13 NOTE — ED Provider Notes (Signed)
[facility] DEPT Provider Note   CSN: [id number] Arrival date & time: 05/13/19  1619     History Chief Complaint  Patient presents with  . Shortness of Breath    Graviel Carlino is a 59 y.o. male.  He has a history of COPD CHF and A. fib and is on anticoagulation.  He underwent a cardioversion on Monday, 4 days ago.  He said he noticed that he was more short of breath on being discharged and has gotten progressively worse since then.  He had a telemedicine visit with his primary care doctor 2 days ago and they prescribed him Augmentin and prednisone, but he did not take the prednisone due to concern that it might trip his rhythm back into A. fib.  He has had a cough productive of yellow sputum with some streaks of blood in it.  No chest pain.  Low-grade fevers.  Short of breath with exertion.  Had a Covid test (negative) prior to cardioversion.  The history is provided by the patient.  Shortness of Breath Severity:  Moderate Onset quality:  Gradual Duration:  4 days Timing:  Intermittent Progression:  Worsening Chronicity:  New Context comment:  Cardioversion Relieved by:  Rest Worsened by:  Activity Ineffective treatments:  None tried Associated symptoms: cough, fever, hemoptysis and sputum production   Associated symptoms: no abdominal pain, no chest pain, no headaches, no neck pain, no rash, no sore throat and no vomiting   Risk factors: hx of PE/DVT and obesity        Past Medical History:  Diagnosis Date  . CHF (congestive heart failure) ([place]) 11/15   EF 40-45%, improved with follow-up  . COPD (chronic obstructive pulmonary disease) ([place])   . Gout   . History of DVT (deep vein thrombosis)   . Hypertension   . Hypertensive cardiovascular disease   . Kidney dysfunction   . Morbid obesity ([place])   . OSA on CPAP   . Paroxysmal atrial fibrillation ([facility])   . PNA (pneumonia)   . Pulmonary embolism ([place]) 2010   left leg DVT    Patient Active  Problem List   Diagnosis Date Noted  . Persistent atrial fibrillation ([place])   . Cough 10/15/2018  . 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 Acute bronchitis with COPD ([place]) 04/09/2018  . Preventative health care 02/05/2018  . Atrial flutter ([place]) 06/22/2017  . Anemia 03/23/2017  . Acute rhinitis 03/10/2017  . Asthma with COPD ([place])   . Chronic diastolic heart failure ([place])   . Long term (current) use of anticoagulants [Z79.01] 08/17/2015  . Hx pulmonary embolism 08/17/2015  . Deep vein thrombosis (DVT) of lower extremity ([place]) 08/17/2015  . Paroxysmal atrial fibrillation ([place]) 08/04/2015  . SVT (supraventricular tachycardia) ([place]) 06/22/2015  . Dyspnea on exertion 06/20/2015  . Morbid obesity due to excess calories ([place]) 03/07/2015  . Elevated troponin 02/20/2015  . Essential hypertension   . Gout   . Mild persistent asthma in adult without complication 62/70/3500  . OSA on CPAP 05/06/2014  . Chronic kidney disease, stage IV (severe) ([place]) 05/06/2014  . Asthma with acute exacerbation 05/06/2014    Past Surgical History:  Procedure Laterality Date  . A-FLUTTER ABLATION N/A 07/22/2017   Procedure: A-FLUTTER ABLATION;  Surgeon: Thompson Grayer, MD;  Location: [facility];  Service: Cardiovascular;  Laterality: N/A;  . CARDIOVERSION N/A 10/14/2018   Procedure: CARDIOVERSION;  Surgeon: Jolaine Artist, MD;  Location: [facility];  Service: Cardiovascular;  Laterality: N/A;  .  CARDIOVERSION N/A 04/22/2019   Procedure: CARDIOVERSION;  Surgeon: Acie Fredrickson Wonda Cheng, MD;  Location: [facility] ENDOSCOPY;  Service: Cardiovascular;  Laterality: N/A;  . CARDIOVERSION N/A 05/10/2019   Procedure: CARDIOVERSION;  Surgeon: Jolaine Artist, MD;  Location: [facility] ENDOSCOPY;  Service: Cardiovascular;  Laterality: N/A;  . UMBILICAL HERNIA REPAIR  1992       Family History  Problem Relation Age of Onset  . Hypertension Mother   . Diabetes Father   . CAD Father   . Stomach cancer Father   . Clotting disorder  Father   . Heart disease Father        transplant  . Allergies Brother     Social History   Tobacco Use  . Smoking status: Former Smoker    Packs/day: 0.25    Years: 1.00    Pack years: 0.25    Types: Cigarettes    Quit date: 07/23/1989    Years since quitting: 29.8  . Smokeless tobacco: Never Used  Substance Use Topics  . Alcohol use: No    Alcohol/week: 0.0 standard drinks  . Drug use: No    Home Medications Prior to Admission medications   Medication Sig Start Date End Date Taking? Authorizing Provider  albuterol (PROAIR HFA) 108 (90 Base) MCG/ACT inhaler Inhale 2 puffs into the lungs every 4 (four) hours as needed for wheezing or shortness of breath. 05/14/18   Tanda Rockers, MD  albuterol (PROVENTIL) (2.5 MG/3ML) 0.083% nebulizer solution INHALE 1 VIAL VIA NEBULIZER EVERY 6 HOURS AS NEEDED FOR WHEEZING/SHORTNESS OF BREATH. 05/13/19   [name], MD  allopurinol (ZYLOPRIM) 300 MG tablet Take 300 mg by mouth daily.    [provider]  amoxicillin-clavulanate (AUGMENTIN) 875-125 MG tablet Take 1 tablet by mouth 2 (two) times daily for 5 days. 05/11/19 05/16/19  Lesleigh Noe, MD  budesonide-formoterol (SYMBICORT) 80-4.5 MCG/ACT inhaler Inhale 2 puffs into the lungs 2 (two) times daily.    [provider]  carvedilol (COREG) 6.25 MG tablet Take 6.25 mg by mouth 2 (two) times daily with a meal.    [provider]  cloNIDine (CATAPRES) 0.1 MG tablet Take 0.1 mg by mouth 3 (three) times daily.    [provider]  colchicine 0.6 MG tablet Take 1 tablet (0.6 mg total) by mouth daily as needed (gout). 10/22/17   [name], MD  famotidine (PEPCID) 20 MG tablet Take 20 mg by mouth at bedtime.    [provider]  flecainide (TAMBOCOR) 100 MG tablet Take 1 tablet (100 mg total) by mouth 2 (two) times daily. 05/02/19   Bensimhon, Shaune Pascal, MD  hydrALAZINE (APRESOLINE) 100 MG tablet Take 1 tablet (100 mg total) by mouth 3 (three) times  daily. 05/09/19   Bensimhon, Shaune Pascal, MD  montelukast (SINGULAIR) 10 MG tablet Take 10 mg by mouth at bedtime.    [provider]  naproxen sodium (ALEVE) 220 MG tablet Take 440 mg by mouth 2 (two) times daily as needed (pain).    [provider]  OXYGEN Inhale 2 L into the lungs daily as needed (low oxygen).    [provider]  oxymetazoline (AFRIN NASAL SPRAY) 0.05 % nasal spray Place 1 spray into both nostrils 2 (two) times daily as needed for congestion. 03/12/17   Oswald Hillock, MD  pantoprazole (PROTONIX) 40 MG tablet Take 40 mg by mouth 2 (two) times daily before a meal.    [provider]  potassium chloride SA (KLOR-CON  M20) 20 MEQ tablet Take 2 tablets (40 mEq total) by mouth every morning AND 1 tablet (20 mEq total) every evening. 01/27/19   Clegg, Amy D, NP  predniSONE (DELTASONE) 20 MG tablet Take 2 tablets (40 mg total) by mouth daily with breakfast for 2 days, THEN 1 tablet (20 mg total) daily with breakfast for 2 days, THEN 0.5 tablets (10 mg total) daily with breakfast for 2 days. 05/11/19 05/17/19  Lesleigh Noe, MD  rivaroxaban (XARELTO) 20 MG TABS tablet Take 20 mg by mouth daily with supper.    [provider]  torsemide (DEMADEX) 20 MG tablet Take 3 tablets (60 mg total) by mouth 2 (two) times daily. Take extra 1 tablet for weight gain Patient taking differently: Take 60 mg by mouth 2 (two) times daily. Take extra 20 mg tablet for weight gain 04/19/19   Sherran Needs, NP  UNABLE TO FIND Med Name: CPAP with sleep    [provider]    Allergies    Other  Review of Systems   Review of Systems  Constitutional: Positive for fever.  HENT: Negative for sore throat.   Eyes: Negative for visual disturbance.  Respiratory: Positive for cough, hemoptysis, sputum production and shortness of breath.   Cardiovascular: Negative for chest pain.  Gastrointestinal: Negative for abdominal pain and vomiting.  Genitourinary: Negative  for dysuria.  Musculoskeletal: Negative for neck pain.  Skin: Negative for rash.  Neurological: Negative for headaches.    Physical Exam Updated Vital Signs BP (!) 163/116 (BP Location: Left Arm)   Pulse 60   Temp 99.1 F (37.3 C) (Oral)   Resp 15   Ht 5\' 9"  (1.753 m)   Wt (!) 173.7 kg   SpO2 93%   BMI 56.56 kg/m   Physical Exam Vitals and nursing note reviewed.  Constitutional:      Appearance: He is well-developed. He is obese.  HENT:     Head: Normocephalic and atraumatic.  Eyes:     Conjunctiva/sclera: Conjunctivae normal.  Cardiovascular:     Rate and Rhythm: Normal rate and regular rhythm.     Heart sounds: No murmur.  Pulmonary:     Effort: Tachypnea and accessory muscle usage present. No respiratory distress.     Breath sounds: Normal breath sounds.  Abdominal:     Palpations: Abdomen is soft.     Tenderness: There is no abdominal tenderness.  Musculoskeletal:        General: Normal range of motion.     Cervical back: Neck supple.     Right lower leg: No tenderness.     Left lower leg: No tenderness.  Skin:    General: Skin is warm and dry.     Capillary Refill: Capillary refill takes less than 2 seconds.  Neurological:     General: No focal deficit present.     Mental Status: He is alert.     ED Results / Procedures / Treatments   Labs (all labs ordered are listed, but only abnormal results are displayed) Labs Reviewed  BASIC METABOLIC PANEL - Abnormal; Notable for the following components:      Result Value   Glucose, Bld 106 (*)    BUN 34 (*)    Creatinine, Ser 2.91 (*)    GFR calc non Af Amer 23 (*)    GFR calc Af Amer 27 (*)    All other components within normal limits  CBC WITH DIFFERENTIAL/PLATELET - Abnormal; Notable for the following components:  RBC 4.12 (*)    Hemoglobin 11.4 (*)    HCT 38.4 (*)    MCHC 29.7 (*)    All other components within normal limits  BRAIN NATRIURETIC PEPTIDE - Abnormal; Notable for the following  components:   B Natriuretic Peptide 754.0 (*)    All other components within normal limits  BASIC METABOLIC PANEL - Abnormal; Notable for the following components:   BUN 34 (*)    Creatinine, Ser 2.78 (*)    Calcium 8.7 (*)    GFR calc non Af Amer 24 (*)    GFR calc Af Amer 28 (*)    All other components within normal limits  CBC - Abnormal; Notable for the following components:   RBC 3.76 (*)    Hemoglobin 10.7 (*)    HCT 35.0 (*)    Platelets 131 (*)    All other components within normal limits  TROPONIN I (HIGH SENSITIVITY) - Abnormal; Notable for the following components:   Troponin I (High Sensitivity) 48 (*)    All other components within normal limits  TROPONIN I (HIGH SENSITIVITY) - Abnormal; Notable for the following components:   Troponin I (High Sensitivity) 45 (*)    All other components within normal limits  SARS CORONAVIRUS 2 (TAT 6-24 HRS)  GLUCOSE, CAPILLARY  HIV ANTIBODY (ROUTINE TESTING W REFLEX)    EKG EKG Interpretation  Date/Time:  Friday May 13 2019 16:33:10 EST Ventricular Rate:  62 PR Interval:    QRS Duration: 121 QT Interval:  475 QTC Calculation: 483 R Axis:   -49 Text Interpretation: Sinus rhythm Atrial premature complex Prolonged PR interval Nonspecific IVCD with LAD Nonspecific T abnormalities, lateral leads No significant change since 2/21 Confirmed by Aletta Edouard [phone]) on 05/13/2019 4:41:37 PM   Radiology DG Chest Port 1 View  Result Date: 05/13/2019 CLINICAL DATA:  Shortness of breath. Additional history provided by technologist: Patient reports shortness of breath for 2 days, worsening today. Patient also reports productive cough with dark red blood and yellow sputum, prescribed antibiotics, former smoker, history of CHF and COPD, history of pulmonary embolism 2010. EXAM: PORTABLE CHEST 1 VIEW COMPARISON:  Chest radiograph 10/02/2018. FINDINGS: Moderate cardiomegaly with central pulmonary vascular congestion, similar to prior  examination. No frank pulmonary edema. No evidence of airspace consolidation within the lungs. No sizable pleural effusion or evidence of pneumothorax. No acute bony abnormality. Overlying cardiac monitoring leads. IMPRESSION: Moderate cardiomegaly with pulmonary vascular congestion, similar to prior exam 10/02/2018. No overt pulmonary edema or evidence of pneumonia. Electronically Signed   By: Kellie Simmering DO   On: 05/13/2019 17:39    Procedures Procedures (including critical care time)  Medications Ordered in ED Medications  allopurinol (ZYLOPRIM) tablet 150 mg (150 mg Oral Given 05/14/19 0943)  carvedilol (COREG) tablet 6.25 mg (6.25 mg Oral Given 05/14/19 0943)  cloNIDine (CATAPRES) tablet 0.1 mg (0.1 mg Oral Given 05/14/19 0942)  flecainide (TAMBOCOR) tablet 100 mg (100 mg Oral Given 05/14/19 0943)  hydrALAZINE (APRESOLINE) tablet 100 mg (100 mg Oral Given 05/14/19 0942)  furosemide (LASIX) injection 80 mg (80 mg Intravenous Given 05/14/19 0942)  pantoprazole (PROTONIX) EC tablet 40 mg (40 mg Oral Given 05/14/19 0943)  famotidine (PEPCID) tablet 20 mg (20 mg Oral Given 05/13/19 2228)  albuterol (VENTOLIN HFA) 108 (90 Base) MCG/ACT inhaler 2 puff (has no administration in time range)  albuterol (PROVENTIL) (2.5 MG/3ML) 0.083% nebulizer solution 2.5 mg (has no administration in time range)  mometasone-formoterol (DULERA) 100-5 MCG/ACT inhaler 2 puff (  2 puffs Inhalation Given 05/14/19 0745)  montelukast (SINGULAIR) tablet 10 mg (10 mg Oral Given 05/13/19 2228)  oxymetazoline (AFRIN) 0.05 % nasal spray 1 spray (has no administration in time range)  acetaminophen (TYLENOL) tablet 650 mg (650 mg Oral Given 05/14/19 0942)    Or  acetaminophen (TYLENOL) suppository 650 mg ( Rectal See Alternative 05/14/19 0942)  senna-docusate (Senokot-S) tablet 1 tablet (has no administration in time range)  ondansetron (ZOFRAN) tablet 4 mg (has no administration in time range)    Or  ondansetron (ZOFRAN) injection 4 mg  (has no administration in time range)  Rivaroxaban (XARELTO) tablet 15 mg (has no administration in time range)  furosemide (LASIX) injection 80 mg (80 mg Intravenous Given 05/13/19 1935)    ED Course  I have reviewed the triage vital signs and the nursing notes.  Pertinent labs & imaging results that were available during my care of the patient were reviewed by me and considered in my medical decision making (see chart for details).  Clinical Course as of May 13 1025  Fri May 13, 2019  1702 Differential includes aspiration, pneumonia, CHF, COPD exacerbation, PE less likely has been good about taking his anticoagulation.   [MB]  0623 Chest x-ray interpreted by me as poor penetration, cardiomegaly, no gross infiltrate   [MB]  1914 Discussed with: Cardiology Dr. Roddie Mc who recommended admission to the hospital service and 80 of IV Lasix.  They will consult on the patient tomorrow sooner if needed.   [MB]  1914 Discussed with [facility] hospitalist Dr. Andria Frames who accepts the patient for admission.   [MB]  1929 Reviewed the results with the patient and the recommendations of cardiology.  He is agreeable to admission.  He said when he stood up to urinate his pulse ox dropped to 86% but is back up to normal at rest.   [MB]    Clinical Course User Index [MB] Hayden Rasmussen, MD   MDM Rules/Calculators/A&P                     Elwood Reist was evaluated in Emergency Department on 05/13/2019 for the symptoms described in the history of present illness. He was evaluated in the context of the global COVID-19 pandemic, which necessitated consideration that the patient might be at risk for infection with the SARS-CoV-2 virus that causes COVID-19. Institutional protocols and algorithms that pertain to the evaluation of patients at risk for COVID-19 are in a state of rapid change based on information released by regulatory bodies including the CDC and federal and state organizations. These policies and  algorithms were followed during the patient's care in the ED.   Final Clinical Impression(s) / ED Diagnoses Final diagnoses:  SOB (shortness of breath)  Acute on chronic congestive heart failure, unspecified heart failure type [facility])    Rx / DC Orders ED Discharge Orders    None       Hayden Rasmussen, MD 05/14/19 1029

## 2019-05-13 NOTE — H&P (Signed)
History and Physical    Phillip [name] ENI:[id number] DOB: [birth year] DOA: 05/13/2019  PCP: [name], MD  Patient coming from: Home  I have personally briefly reviewed patient's old medical records in [place]  Chief Complaint: Shortness of breath/dyspnea on exertion  HPI: Phillip [name] is a 59 y.o. male with medical history significant of paroxysmal atrial fibrillation/atrial flutter status post ablation May 2019, remote history of PE/DVT, OSA on CPAP, morbid obesity, CKD 3, hypertension, chronic combined systolic and diastolic heart failure, and recent DC-CV this past Monday who presents with worsening shortness of breath since procedure.  Patient reports he underwent his third cardioversion attempt on Monday and since then he has been having shortness of breath.  He reports that he has gained 4 pounds since that time.  He states he cannot walk more than 10 feet without getting winded.  He has been on torsemide at home and has been compliant with his medications.  He denies any fevers, chills, diarrhea, anosmia, and myalgias.  He was tested for Covid preprocedure and it was negative.  He states in the past he has felt like this and take an additional dose of torsemide which has helped.  He has been compliant with all of his medications has just not taken his evening doses.  Only medication he has not taken his prednisone which was recently prescribed.  However he has not noticed any wheezing.  He has been compliant with his antibiotics have been on azithromycin and now is on Augmentin.  He does have some nausea and vomited one time.  He denies any chest pain, palpitations.  He is unsure if he has had any worsening orthopnea but he does report sleeping on his side and uses CPAP at night.  He is not on any oxygen at home.  He states he is only intermittently needed at times.  When ambulated in the room he says his oxygen dropped to 84%.  When he is feeling at his best he feels that he does  not have any limitation to his exercise tolerance.  He denies smoking, alcohol, or drug use    Review of Systems: As per HPI otherwise 10 point review of systems negative.    Past Medical History:  Diagnosis Date  . CHF (congestive heart failure) ([place]) 11/15   EF 40-45%, improved with follow-up  . COPD (chronic obstructive pulmonary disease) ([place])   . Gout   . History of DVT (deep vein thrombosis)   . Hypertension   . Hypertensive cardiovascular disease   . Kidney dysfunction   . Morbid obesity ([place])   . OSA on CPAP   . Paroxysmal atrial fibrillation (H[facility])   . PNA (pneumonia)   . Pulmonary embolism ([place]) 2010   left leg DVT    Past Surgical History:  Procedure Laterality Date  . A-FLUTTER ABLATION N/A 07/22/2017   Procedure: A-FLUTTER ABLATION;  Surgeon: Thompson Grayer, MD;  Location: [facility];  Service: Cardiovascular;  Laterality: N/A;  . CARDIOVERSION N/A 10/14/2018   Procedure: CARDIOVERSION;  Surgeon: Jolaine Artist, MD;  Location: [facility] ENDOSCOPY;  Service: Cardiovascular;  Laterality: N/A;  . CARDIOVERSION N/A 04/22/2019   Procedure: CARDIOVERSION;  Surgeon: Thayer Headings, MD;  Location: [facility] ENDOSCOPY;  Service: Cardiovascular;  Laterality: N/A;  . CARDIOVERSION N/A 05/10/2019   Procedure: CARDIOVERSION;  Surgeon: Jolaine Artist, MD;  Location: [facility];  Service: Cardiovascular;  Laterality: N/A;  . [place]  reports that he quit smoking about 29 years ago. His smoking use included cigarettes. He has a 0.25 pack-year smoking history. He has never used smokeless tobacco. He reports that he does not drink alcohol or use drugs.  Allergies  Allergen Reactions  . Other Anaphylaxis    mushrooms    Family History  Problem Relation Age of Onset  . Hypertension Mother   . Diabetes Father   . CAD Father   . Stomach cancer Father   . Clotting disorder Father   . Heart disease Father        transplant  . Allergies Brother        Prior to Admission medications   Medication Sig Start Date End Date Taking? Authorizing Provider  albuterol (PROAIR HFA) 108 (90 Base) MCG/ACT inhaler Inhale 2 puffs into the lungs every 4 (four) hours as needed for wheezing or shortness of breath. 05/14/18  Yes Tanda Rockers, MD  albuterol (PROVENTIL) (2.5 MG/3ML) 0.083% nebulizer solution INHALE 1 VIAL VIA NEBULIZER EVERY 6 HOURS AS NEEDED FOR WHEEZING/SHORTNESS OF BREATH. Patient taking differently: Take 2.5 mg by nebulization every 6 (six) hours as needed for wheezing or shortness of breath.  05/13/19  Yes [name], MD  allopurinol (ZYLOPRIM) 300 MG tablet Take 300 mg by mouth daily.   Yes [provider]  amoxicillin-clavulanate (AUGMENTIN) 875-125 MG tablet Take 1 tablet by mouth 2 (two) times daily for 5 days. 05/11/19 05/16/19 Yes Lesleigh Noe, MD  budesonide-formoterol [facility]) 80-4.5 MCG/ACT inhaler Inhale 2 puffs into the lungs 2 (two) times daily.   Yes [provider]  carvedilol (COREG) 6.25 MG tablet Take 6.25 mg by mouth 2 (two) times daily with a meal.   Yes [provider]  cloNIDine (CATAPRES) 0.1 MG tablet Take 0.1 mg by mouth 3 (three) times daily.   Yes [provider]  colchicine 0.6 MG tablet Take 1 tablet (0.6 mg total) by mouth daily as needed (gout). 10/22/17  Yes [name], MD  famotidine (PEPCID) 20 MG tablet Take 20 mg by mouth at bedtime.   Yes [provider]  flecainide (TAMBOCOR) 100 MG tablet Take 1 tablet (100 mg total) by mouth 2 (two) times daily. 05/02/19  Yes Bensimhon, Shaune Pascal, MD  hydrALAZINE (APRESOLINE) 100 MG tablet Take 1 tablet (100 mg total) by mouth 3 (three) times daily. 05/09/19  Yes Bensimhon, Shaune Pascal, MD  montelukast (SINGULAIR) 10 MG tablet Take 10 mg by mouth at bedtime.   Yes [provider]  naproxen sodium (ALEVE) 220 MG tablet Take 440 mg by mouth 2 (two) times daily as needed (pain).   Yes [provider]   oxymetazoline (AFRIN NASAL SPRAY) 0.05 % nasal spray Place 1 spray into both nostrils 2 (two) times daily as needed for congestion. 03/12/17  Yes Oswald Hillock, MD  pantoprazole (PROTONIX) 40 MG tablet Take 40 mg by mouth 2 (two) times daily before a meal.   Yes [provider]  potassium chloride SA (KLOR-CON M20) 20 MEQ tablet Take 2 tablets (40 mEq total) by mouth every morning AND 1 tablet (20 mEq total) every evening. 01/27/19  Yes Clegg, Amy D, NP  rivaroxaban (XARELTO) 20 MG TABS tablet Take 20 mg by mouth daily with supper.   Yes [provider]  torsemide (DEMADEX) 20 MG tablet Take 3 tablets (60 mg total) by mouth 2 (two) times daily. Take extra 1 tablet for weight gain Patient taking differently: Take 60 mg  by mouth 2 (two) times daily.  04/19/19  Yes Sherran Needs, NP  OXYGEN Inhale 2 L into the lungs daily as needed (low oxygen).    [provider]  predniSONE (DELTASONE) 20 MG tablet Take 2 tablets (40 mg total) by mouth daily with breakfast for 2 days, THEN 1 tablet (20 mg total) daily with breakfast for 2 days, THEN 0.5 tablets (10 mg total) daily with breakfast for 2 days. 05/11/19 05/17/19  Lesleigh Noe, MD  UNABLE TO FIND Med Name: CPAP with sleep    [provider]    Physical Exam: Vitals:   05/13/19 1634 05/13/19 1800 05/13/19 1830 05/13/19 1900  BP:  (!) 163/116 (!) 157/74 (!) 170/80  Pulse:  60 62 (!) 56  Resp:  16 16 16   Temp:      TempSrc:      SpO2:  93% 93% 95%  Weight: (!) 173.7 kg     Height: 5\' 9"  (1.753 m)       Vitals:   05/13/19 1634 05/13/19 1800 05/13/19 1830 05/13/19 1900  BP:  (!) 163/116 (!) 157/74 (!) 170/80  Pulse:  60 62 (!) 56  Resp:  16 16 16   Temp:      TempSrc:      SpO2:  93% 93% 95%  Weight: (!) 173.7 kg     Height: 5\' 9"  (1.753 m)        Constitutional: NAD, calm, comfortable, morbidly obese, seated upright Eyes: PERRL, lids and conjunctivae normal ENMT: Mucous membranes are moist.  Posterior pharynx clear of any exudate or lesions.Normal dentition.  Neck: normal, supple, no masses, no thyromegaly Respiratory: no wheezing, no crackles appreciated but exam is somewhat limited by patient's body habitus Cardiovascular: Regular rate and rhythm, no murmurs / rubs / gallops. 1-2+ BL Lower extremity edema. 2+ pedal pulses. No carotid bruits.  Abdomen: Obese, no tenderness, no masses palpated. No hepatosplenomegaly. Bowel sounds positive.  Musculoskeletal: no clubbing / cyanosis. No joint deformity upper and lower extremities. Good ROM, no contractures. Normal muscle tone.  Skin: no rashes, lesions, ulcers. No induration Neurologic: CN 2-12 grossly intact. Sensation intact, DTR normal. Strength 5/5 in all 4.  Psychiatric: Normal judgment and insight. Alert and oriented x 3. Normal mood.     Labs on Admission: I have personally reviewed following labs and imaging studies  CBC: Recent Labs  Lab 05/09/19 1029 05/13/19 1706  WBC 5.3 5.8  NEUTROABS  --  4.6  HGB 12.1* 11.4*  HCT 40.7 38.4*  MCV 93.8 93.2  PLT 187 244   Basic Metabolic Panel: Recent Labs  Lab 05/09/19 1029 05/13/19 1706  NA 144 143  K 3.8 4.4  CL 106 107  CO2 25 28  GLUCOSE 157* 106*  BUN 33* 34*  CREATININE 2.50* 2.91*  CALCIUM 9.1 8.9   GFR: Estimated Creatinine Clearance: 44.9 mL/min (A) (by C-G formula based on SCr of 2.91 mg/dL (H)). Liver Function Tests: No results for input(s): AST, ALT, ALKPHOS, BILITOT, PROT, ALBUMIN in the last 168 hours. No results for input(s): LIPASE, AMYLASE in the last 168 hours. No results for input(s): AMMONIA in the last 168 hours. Coagulation Profile: Recent Labs  Lab 05/09/19 1029  INR 1.6*   Cardiac Enzymes: No results for input(s): CKTOTAL, CKMB, CKMBINDEX, TROPONINI in the last 168 hours. BNP (last 3 results) No results for input(s): PROBNP in the last 8760 hours. HbA1C: No results for input(s): HGBA1C in the last 72 hours. CBG: No results  for  input(s): GLUCAP in the last 168 hours. Lipid Profile: No results for input(s): CHOL, HDL, LDLCALC, TRIG, CHOLHDL, LDLDIRECT in the last 72 hours. Thyroid Function Tests: No results for input(s): TSH, T4TOTAL, FREET4, T3FREE, THYROIDAB in the last 72 hours. Anemia Panel: No results for input(s): VITAMINB12, FOLATE, FERRITIN, TIBC, IRON, RETICCTPCT in the last 72 hours. Urine analysis:    Component Value Date/Time   COLORURINE STRAW (A) 09/17/2017 2350   APPEARANCEUR CLEAR 09/17/2017 2350   LABSPEC 1.009 09/17/2017 2350   PHURINE 5.0 09/17/2017 2350   GLUCOSEU NEGATIVE 09/17/2017 2350   HGBUR NEGATIVE 09/17/2017 2350   BILIRUBINUR NEGATIVE 09/17/2017 2350   KETONESUR NEGATIVE 09/17/2017 2350   PROTEINUR NEGATIVE 09/17/2017 2350   UROBILINOGEN 1.0 12/04/2014 0546   NITRITE NEGATIVE 09/17/2017 2350   LEUKOCYTESUR NEGATIVE 09/17/2017 2350    Radiological Exams on Admission: DG Chest Port 1 View  Result Date: 05/13/2019 CLINICAL DATA:  Shortness of breath. Additional history provided by technologist: Patient reports shortness of breath for 2 days, worsening today. Patient also reports productive cough with dark red blood and yellow sputum, prescribed antibiotics, former smoker, history of CHF and COPD, history of pulmonary embolism 2010. EXAM: PORTABLE CHEST 1 VIEW COMPARISON:  Chest radiograph 10/02/2018. FINDINGS: Moderate cardiomegaly with central pulmonary vascular congestion, similar to prior examination. No frank pulmonary edema. No evidence of airspace consolidation within the lungs. No sizable pleural effusion or evidence of pneumothorax. No acute bony abnormality. Overlying cardiac monitoring leads. IMPRESSION: Moderate cardiomegaly with pulmonary vascular congestion, similar to prior exam 10/02/2018. No overt pulmonary edema or evidence of pneumonia. Electronically Signed   By: Kellie Simmering DO   On: 05/13/2019 17:39    EKG: Independently reviewed.   Assessment/Plan Phillip [name] is  a 59 y.o. male with medical history significant of paroxysmal atrial fibrillation/atrial flutter status post ablation May 2019, remote history of PE/DVT, OSA on CPAP, morbid obesity, CKD 3, hypertension, chronic combined systolic and diastolic heart failure, and recent DC-CV this past Monday who presents with worsening shortness of breath since procedure likely 2/2 acute on chronic CHF exacerbation  # Acute Hypoxemic Respiratory Failure # Acute on chronic combined systolic an diastolic heart failure - patient desats to low 80s with minimal ambulation and is not on any oxygen at home.  No wheezing appreciated on exam (was recently on abx and steroids, will complete the course).  Patient's last Echo on 01/2019 (EF 45-50%) prior to recent cardioversions, will defer to cardiology (who will consult in AM) regarding repeat Echo. BNP is 754, trop is elevated to 48 -> 45 without change.  Patient self-reports 4 lb weight gain since Monday.  Patient has not had prior ischemic eval as it was felt patient's renal function precluded work-up. - transition to IV Lasix 80 mg BID and monitor I/O and daily weights - monitor electrolytes - maintain K > 4 and Mg > 2 but discretion in setting of AKI/CKD  # AKI on CKD 3 # Anemia of chronic renal disease - Creatinine is 2.91, was 2.50 on 2/15 and prior baseline 2.1-2.2 - anticipate pre-renal/cardiorenal, anticipate improvement with diuresis - monitor I/O - dose reduce meds accordingly - avoid nephrotoxic agents  # Paroxymal Atrial Fibrillation/FLutter - presently in NSR, recent cardioversion - continue carvedilol and flecainide - continue xarelto  # COPD - without acute exacerbation, held prednisone, continue previous course of abx - not on home oxygen - continue pta inhaler regimen with appropriate class formulary substitutions  # HLD - continue atorvastatin  #  HTN - continue pta hydralazine, carvedilol and clonidine  # Gout  - continue allopurinol  #  GERD - on both PPI and H2 Blocker. - continued for now  # Morbid Obesity # OSA - BMI of 79.81 - complicates all aspects of care - RT consult for CPAP   DVT prophylaxis: Xarelto Code Status: Full Family Communication: Patient to inform Disposition Plan: pending Consults called: Cardiology - will evaluate in AM Admission status: Telemetry   Truddie Hidden MD [facility] Pager [phone]  If 7PM-7AM, please contact night-coverage [email or web address] Password [facility]  05/13/2019, 7:35 PM

## 2019-05-13 NOTE — ED Notes (Signed)
ED TO INPATIENT HANDOFF REPORT  ED Nurse Name and Phone #: Lysle Dingwall Name/Age/Gender Phillip [name] 59 y.o. male Room/Bed: WA22/WA22  Code Status   Code Status: Prior  Home/SNF/Other Home Patient oriented to: self, place, time and situation Is this baseline? Yes   Triage Complete: Triage complete  Chief Complaint Acute exacerbation of CHF (congestive heart failure) ([place]) [I50.9]  Triage Note Patient c/o SOB x 2 days and worsening today. Patient states he has had a productive cough with dark red blood and yellow sputum. Patient states he called his physician and was prescribed Amoxicillin and Prednisone ,but did not take the Prednisone because he recently had a cardioversion.    Allergies Allergies  Allergen Reactions  . Other Anaphylaxis    mushrooms    Level of Care/Admitting Diagnosis ED Disposition    ED Disposition Condition Comment   Admit  Hospital Area: [facility] [[id number]]  Level of Care: Telemetry [5]  Admit to tele based on following criteria: Complex arrhythmia (Bradycardia/Tachycardia)  May admit patient to [facility] or [facility] if equivalent level of care is available:: No  Covid Evaluation: Asymptomatic Screening Protocol (No Symptoms)  Diagnosis: Acute exacerbation of CHF (congestive heart failure) [facility]) [[id number]]  Admitting Physician: Truddie Hidden [[id number]]  Attending Physician: Truddie Hidden [[id number]]  Estimated length of stay: 3 - 4 days  Certification:: I certify this patient will need inpatient services for at least 2 midnights       B Medical/Surgery History Past Medical History:  Diagnosis Date  . CHF (congestive heart failure) ([place]) 11/15   EF 40-45%, improved with follow-up  . COPD (chronic obstructive pulmonary disease) ([place])   . Gout   . History of DVT (deep vein thrombosis)   . Hypertension   . Hypertensive cardiovascular disease   . Kidney dysfunction   . Morbid obesity ([place])   . OSA on CPAP   .  Paroxysmal atrial fibrillation ([facility])   . PNA (pneumonia)   . Pulmonary embolism ([place]) 2010   left leg DVT   Past Surgical History:  Procedure Laterality Date  . A-FLUTTER ABLATION N/A 07/22/2017   Procedure: A-FLUTTER ABLATION;  Surgeon: Thompson Grayer, MD;  Location: [facility];  Service: Cardiovascular;  Laterality: N/A;  . CARDIOVERSION N/A 10/14/2018   Procedure: CARDIOVERSION;  Surgeon: Jolaine Artist, MD;  Location: [facility];  Service: Cardiovascular;  Laterality: N/A;  . CARDIOVERSION N/A 04/22/2019   Procedure: CARDIOVERSION;  Surgeon: Thayer Headings, MD;  Location: [facility];  Service: Cardiovascular;  Laterality: N/A;  . CARDIOVERSION N/A 05/10/2019   Procedure: CARDIOVERSION;  Surgeon: Jolaine Artist, MD;  Location: [facility] ENDOSCOPY;  Service: Cardiovascular;  Laterality: N/A;  . [place]     A IV Location/Drains/Wounds Patient Lines/Drains/Airways Status   Active Line/Drains/Airways    Name:   Placement date:   Placement time:   Site:   Days:   Peripheral IV 05/13/19 Right Forearm   05/13/19    1708    Forearm   less than 1          Intake/Output Last 24 hours No intake or output data in the 24 hours ending 05/13/19 2124  Labs/Imaging Results for orders placed or performed during the hospital encounter of 05/13/19 (from the past 48 hour(s))  Basic metabolic panel     Status: Abnormal   Collection Time: 05/13/19  5:06 PM  Result Value Ref Range   Sodium 143 135 - 145  mmol/L   Potassium 4.4 3.5 - 5.1 mmol/L   Chloride 107 98 - 111 mmol/L   CO2 28 22 - 32 mmol/L   Glucose, Bld 106 (H) 70 - 99 mg/dL   BUN 34 (H) 6 - 20 mg/dL   Creatinine, Ser 2.91 (H) 0.61 - 1.24 mg/dL   Calcium 8.9 8.9 - 10.3 mg/dL   GFR calc non Af Amer 23 (L) >60 mL/min   GFR calc Af Amer 27 (L) >60 mL/min   Anion gap 8 5 - 15    Comment: Performed at [facility], [place] [address], [place], [place] [address]  CBC with Differential      Status: Abnormal   Collection Time: 05/13/19  5:06 PM  Result Value Ref Range   WBC 5.8 4.0 - 10.5 K/uL   RBC 4.12 (L) 4.22 - 5.81 MIL/uL   Hemoglobin 11.4 (L) 13.0 - 17.0 g/dL   HCT 38.4 (L) 39.0 - 52.0 %   MCV 93.2 80.0 - 100.0 fL   MCH 27.7 26.0 - 34.0 pg   MCHC 29.7 (L) 30.0 - 36.0 g/dL   RDW 15.5 11.5 - 15.5 %   Platelets 150 150 - 400 K/uL   nRBC 0.0 0.0 - 0.2 %   Neutrophils Relative % 79 %   Neutro Abs 4.6 1.7 - 7.7 K/uL   Lymphocytes Relative 12 %   Lymphs Abs 0.7 0.7 - 4.0 K/uL   Monocytes Relative 8 %   Monocytes Absolute 0.5 0.1 - 1.0 K/uL   Eosinophils Relative 1 %   Eosinophils Absolute 0.1 0.0 - 0.5 K/uL   Basophils Relative 0 %   Basophils Absolute 0.0 0.0 - 0.1 K/uL   Immature Granulocytes 0 %   Abs Immature Granulocytes 0.01 0.00 - 0.07 K/uL    Comment: Performed at [facility], [place] [address], [place], [place] [address]  Troponin I (High Sensitivity)     Status: Abnormal   Collection Time: 05/13/19  5:06 PM  Result Value Ref Range   Troponin I (High Sensitivity) 48 (H) <18 ng/L    Comment: (NOTE) Elevated high sensitivity troponin I (hsTnI) values and significant  changes across serial measurements may suggest ACS but many other  chronic and acute conditions are known to elevate hsTnI results.  Refer to the "Links" section for chest pain algorithms and additional  guidance. Performed at [facility], [place] [address], [place], [place] [address]   Brain natriuretic peptide     Status: Abnormal   Collection Time: 05/13/19  5:06 PM  Result Value Ref Range   B Natriuretic Peptide 754.0 (H) 0.0 - 100.0 pg/mL    Comment: Performed at [facility], [place] [address] [place], [place] [address]  Troponin I (High Sensitivity)     Status: Abnormal   Collection Time: 05/13/19  6:39 PM  Result Value Ref Range   Troponin I (High Sensitivity) 45 (H) <18 ng/L    Comment: (NOTE) Elevated high sensitivity troponin  I (hsTnI) values and significant  changes across serial measurements may suggest ACS but many other  chronic and acute conditions are known to elevate hsTnI results.  Refer to the "Links" section for chest pain algorithms and additional  guidance. Performed at [facility], [place] [address], [place], [place] [address]    DG Chest Port 1 View  Result Date: 05/13/2019 CLINICAL DATA:  Shortness of breath. Additional history provided by technologist: Patient reports shortness of breath  for 2 days, worsening today. Patient also reports productive cough with dark red blood and yellow sputum, prescribed antibiotics, former smoker, history of CHF and COPD, history of pulmonary embolism 2010. EXAM: PORTABLE CHEST 1 VIEW COMPARISON:  Chest radiograph 10/02/2018. FINDINGS: Moderate cardiomegaly with central pulmonary vascular congestion, similar to prior examination. No frank pulmonary edema. No evidence of airspace consolidation within the lungs. No sizable pleural effusion or evidence of pneumothorax. No acute bony abnormality. Overlying cardiac monitoring leads. IMPRESSION: Moderate cardiomegaly with pulmonary vascular congestion, similar to prior exam 10/02/2018. No overt pulmonary edema or evidence of pneumonia. Electronically Signed   By: Kellie Simmering DO   On: 05/13/2019 17:39    Pending Labs Unresulted Labs (From admission, onward)    Start     Ordered   05/13/19 1916  SARS CORONAVIRUS 2 (TAT 6-24 HRS) Nasopharyngeal Nasopharyngeal Swab  (Tier 3 (TAT 6-24 hrs))  Once,   STAT    Question Answer Comment  Is this test for diagnosis or screening Screening   Symptomatic for COVID-19 as defined by CDC No   Hospitalized for COVID-19 No   Admitted to ICU for COVID-19 No   Previously tested for COVID-19 Yes   Resident in a congregate (group) care setting No   Employed in healthcare setting No      05/13/19 1915   Signed and Held  HIV Antibody (routine testing w rflx)  (HIV Antibody  (Routine testing w reflex) panel)  Once,   R     Signed and Held   Signed and Held  Basic metabolic panel  Once,   R     Signed and Held   Signed and Held  Basic metabolic panel  Tomorrow morning,   R     Signed and Held   Signed and Held  CBC  Tomorrow morning,   R     Signed and Held          Vitals/Pain Today's Vitals   05/13/19 1830 05/13/19 1900 05/13/19 1930 05/13/19 2030  BP: (!) 157/74 (!) 170/80 (!) 193/86 (!) 171/72  Pulse: 62 (!) 56 65 66  Resp: 16 16 17 20   Temp:      TempSrc:      SpO2: 93% 95% (!) 85% 93%  Weight:      Height:        Isolation Precautions No active isolations  Medications Medications  furosemide (LASIX) injection 80 mg (80 mg Intravenous Given 05/13/19 1935)    Mobility walks Low fall risk   Focused Assessments    R Recommendations: See Admitting Provider Note  Report given to:   Additional Notes:

## 2019-05-13 NOTE — ED Triage Notes (Signed)
Patient c/o SOB x 2 days and worsening today. Patient states he has had a productive cough with dark red blood and yellow sputum. Patient states he called his physician and was prescribed Amoxicillin and Prednisone ,but did not take the Prednisone because he recently had a cardioversion.

## 2019-05-14 DIAGNOSIS — N170 Acute kidney failure with tubular necrosis: Secondary | ICD-10-CM

## 2019-05-14 DIAGNOSIS — N183 Chronic kidney disease, stage 3 unspecified: Secondary | ICD-10-CM

## 2019-05-14 DIAGNOSIS — I10 Essential (primary) hypertension: Secondary | ICD-10-CM

## 2019-05-14 DIAGNOSIS — N1831 Chronic kidney disease, stage 3a: Secondary | ICD-10-CM

## 2019-05-14 DIAGNOSIS — N179 Acute kidney failure, unspecified: Secondary | ICD-10-CM

## 2019-05-14 DIAGNOSIS — Z86711 Personal history of pulmonary embolism: Secondary | ICD-10-CM

## 2019-05-14 DIAGNOSIS — Z7901 Long term (current) use of anticoagulants: Secondary | ICD-10-CM

## 2019-05-14 LAB — BASIC METABOLIC PANEL
Anion gap: 8 (ref 5–15)
BUN: 34 mg/dL — ABNORMAL HIGH (ref 6–20)
CO2: 28 mmol/L (ref 22–32)
Calcium: 8.7 mg/dL — ABNORMAL LOW (ref 8.9–10.3)
Chloride: 106 mmol/L (ref 98–111)
Creatinine, Ser: 2.78 mg/dL — ABNORMAL HIGH (ref 0.61–1.24)
GFR calc Af Amer: 28 mL/min — ABNORMAL LOW (ref >60)
GFR calc non Af Amer: 24 mL/min — ABNORMAL LOW (ref >60)
Glucose, Bld: 98 mg/dL (ref 70–99)
Potassium: 3.9 mmol/L (ref 3.5–5.1)
Sodium: 142 mmol/L (ref 135–145)

## 2019-05-14 LAB — CBC
HCT: 35 % — ABNORMAL LOW (ref 39.0–52.0)
Hemoglobin: 10.7 g/dL — ABNORMAL LOW (ref 13.0–17.0)
MCH: 28.5 pg (ref 26.0–34.0)
MCHC: 30.6 g/dL (ref 30.0–36.0)
MCV: 93.1 fL (ref 80.0–100.0)
Platelets: 131 10*3/uL — ABNORMAL LOW (ref 150–400)
RBC: 3.76 MIL/uL — ABNORMAL LOW (ref 4.22–5.81)
RDW: 15.5 % (ref 11.5–15.5)
WBC: 4.8 10*3/uL (ref 4.0–10.5)
nRBC: 0 % (ref 0.0–0.2)

## 2019-05-14 LAB — SARS CORONAVIRUS 2 (TAT 6-24 HRS): SARS Coronavirus 2: NEGATIVE

## 2019-05-14 LAB — GLUCOSE, CAPILLARY: Glucose-Capillary: 85 mg/dL (ref 70–99)

## 2019-05-14 MED ORDER — RIVAROXABAN 15 MG PO TABS
15.0000 mg | ORAL_TABLET | Freq: Every day | ORAL | Status: DC
  Administered 2019-05-14 – 2019-05-16 (×3): 15 mg via ORAL
  Filled 2019-05-14 (×3): qty 1

## 2019-05-14 NOTE — Progress Notes (Signed)
PROGRESS NOTE    Phillip [name]  JIR:[id number] DOB: Sep 10, 1960 DOA: 05/13/2019 PCP: [name], MD     Brief Narrative:  59 year old man admitted from home on 2/19 due to shortness of breath.  His past medical history significant for paroxysmal A. fib/a flutter status post ablation in 2019, remote history of PE/DVT, morbid obesity, obstructive sleep apnea on CPAP, chronic kidney disease stage III, hypertension as well as chronic combined systolic and diastolic heart failure.  He is status post DCCV this past Monday and his shortness of breath has been increasing since this procedure.  He was found to be in acute congestive heart failure and admission was requested for further evaluation and management.   Assessment & Plan:   Principal Problem:   Acute on chronic combined systolic and diastolic CHF (congestive heart failure) (HCC) Active Problems:   OSA on CPAP   Essential hypertension   Morbid obesity due to excess calories (HCC)   Paroxysmal atrial fibrillation ([place])   Long term (current) use of anticoagulants [Z79.01]   Hx pulmonary embolism   CKD (chronic kidney disease) stage 3, GFR 30-59 ml/min   ARF (acute renal failure) (HCC)   Acute on chronic combined heart failure -2D echo from November 2020 shows ejection fraction of 45 to 50% with grade 2 diastolic dysfunction. -He remains on Lasix 80 mg IV every 12 hours. -Unclear if intake and output is documented correctly, but shows only 300 cc negative since admission.  Have discussed with RN. -Remains on Coreg, no ACE inhibitor/ARB/ARN I due to advanced chronic kidney disease. -Check lipids for restratification and consider starting statin. -Unclear why he is on antibiotics, will discontinue.  Acute on chronic kidney disease stage III -Baseline creatinine appears to be around 2.1. -Was 2.9 on admission and down to 2.78 today. -Continue to follow renal function on a daily basis.  Paroxysmal atrial fibrillation -Status post  DCCV earlier this week, currently in normal sinus rhythm, rates little low. -Is also on flecainide for rhythm control. -On Xarelto for anticoagulation purposes.  Morbid obesity -Body mass index is 56.56 kg/m. -Noted.  History of hypertension -BP a little elevated, follow.  Obstructive sleep apnea -On nightly CPAP.   DVT prophylaxis: Anticoagulated on Xarelto Code Status: Full code Family Communication: Patient only Disposition Plan: Anticipate need to remain inpatient for another 48 to 72 hours to ensure adequate diuresis  Consultants:   Cardiology pending  Procedures:   None  Antimicrobials:  Anti-infectives (From admission, onward)   Start     Dose/Rate Route Frequency Ordered Stop   05/13/19 2215  amoxicillin-clavulanate (AUGMENTIN) 875-125 MG per tablet 1 tablet     1 tablet Oral 2 times daily 05/13/19 2202 05/16/19 2159       Subjective: Sitting at bedside, still describes a little shortness of breath especially with activity, no chest pain  Objective: Vitals:   05/13/19 2147 05/13/19 2240 05/14/19 0444 05/14/19 0746  BP: (!) 174/92  (!) 144/84   Pulse: (!) 59 67 (!) 50 (!) 58  Resp: 18 17 18  (!) 21  Temp: 98.1 F (36.7 C)  98.5 F (36.9 C)   TempSrc: Oral  Oral   SpO2: 98% 94% 98% 98%  Weight:      Height:        Intake/Output Summary (Last 24 hours) at 05/14/2019 0935 Last data filed at 05/14/2019 0814 Gross per 24 hour  Intake -  Output 300 ml  Net -300 ml   [employer]   05/13/19  1634  Weight: (!) 173.7 kg    Examination:  General exam: Alert, awake, oriented x 3, morbidly obese Respiratory system: Decreased breath sounds to bilateral bases, respiratory effort normal. Cardiovascular system:RRR. No murmurs, rubs, gallops. Gastrointestinal system: Abdomen is obese, nondistended, soft and nontender. No organomegaly or masses felt. Normal bowel sounds heard. Central nervous system: Alert and oriented. No focal neurological deficits.  Extremities: 1+ pitting edema bilaterally to mid shins, +pedal pulses Skin: No rashes, lesions or ulcers Psychiatry: Judgement and insight appear normal. Mood & affect appropriate.     Data Reviewed: I have personally reviewed following labs and imaging studies  CBC: Recent Labs  Lab 05/09/19 1029 05/13/19 1706 05/14/19 0306  WBC 5.3 5.8 4.8  NEUTROABS  --  4.6  --   HGB 12.1* 11.4* 10.7*  HCT 40.7 38.4* 35.0*  MCV 93.8 93.2 93.1  PLT 187 150 818*   Basic Metabolic Panel: Recent Labs  Lab 05/09/19 1029 05/13/19 1706 05/14/19 0306  NA 144 143 142  K 3.8 4.4 3.9  CL 106 107 106  CO2 25 28 28   GLUCOSE 157* 106* 98  BUN 33* 34* 34*  CREATININE 2.50* 2.91* 2.78*  CALCIUM 9.1 8.9 8.7*   GFR: Estimated Creatinine Clearance: 47 mL/min (A) (by C-G formula based on SCr of 2.78 mg/dL (H)). Liver Function Tests: No results for input(s): AST, ALT, ALKPHOS, BILITOT, PROT, ALBUMIN in the last 168 hours. No results for input(s): LIPASE, AMYLASE in the last 168 hours. No results for input(s): AMMONIA in the last 168 hours. Coagulation Profile: Recent Labs  Lab 05/09/19 1029  INR 1.6*   Cardiac Enzymes: No results for input(s): CKTOTAL, CKMB, CKMBINDEX, TROPONINI in the last 168 hours. BNP (last 3 results) No results for input(s): PROBNP in the last 8760 hours. HbA1C: No results for input(s): HGBA1C in the last 72 hours. CBG: Recent Labs  Lab 05/14/19 0813  GLUCAP 85   Lipid Profile: No results for input(s): CHOL, HDL, LDLCALC, TRIG, CHOLHDL, LDLDIRECT in the last 72 hours. Thyroid Function Tests: No results for input(s): TSH, T4TOTAL, FREET4, T3FREE, THYROIDAB in the last 72 hours. Anemia Panel: No results for input(s): VITAMINB12, FOLATE, FERRITIN, TIBC, IRON, RETICCTPCT in the last 72 hours. Urine analysis:    Component Value Date/Time   COLORURINE STRAW (A) 09/17/2017 2350   APPEARANCEUR CLEAR 09/17/2017 2350   LABSPEC 1.009 09/17/2017 2350   PHURINE 5.0  09/17/2017 2350   GLUCOSEU NEGATIVE 09/17/2017 2350   HGBUR NEGATIVE 09/17/2017 2350   BILIRUBINUR NEGATIVE 09/17/2017 2350   KETONESUR NEGATIVE 09/17/2017 2350   PROTEINUR NEGATIVE 09/17/2017 2350   UROBILINOGEN 1.0 12/04/2014 0546   NITRITE NEGATIVE 09/17/2017 2350   LEUKOCYTESUR NEGATIVE 09/17/2017 2350   Sepsis Labs: @LABRCNTIP (procalcitonin:4,lacticidven:4)  ) Recent Results (from the past 240 hour(s))  SARS CORONAVIRUS 2 (TAT 6-24 HRS) Nasopharyngeal Nasopharyngeal Swab     Status: None   Collection Time: 05/09/19 10:58 AM   Specimen: Nasopharyngeal Swab  Result Value Ref Range Status   SARS Coronavirus 2 NEGATIVE NEGATIVE Final    Comment: (NOTE) SARS-CoV-2 target nucleic acids are NOT DETECTED. The SARS-CoV-2 RNA is generally detectable in upper and lower respiratory specimens during the acute phase of infection. Negative results do not preclude SARS-CoV-2 infection, do not rule out co-infections with other pathogens, and should not be used as the sole basis for treatment or other patient management decisions. Negative results must be combined with clinical observations, patient history, and epidemiological information. The expected result is Negative. Fact  Sheet for Patients: SugarRoll.be Fact Sheet for Healthcare Providers: https://www.woods-mathews.com/ This test is not yet approved or cleared by the [place] FDA and  has been authorized for detection and/or diagnosis of SARS-CoV-2 by FDA under an Emergency Use Authorization (EUA). This EUA will remain  in effect (meaning this test can be used) for the duration of the COVID-19 declaration under Section 56 4(b)(1) of the Act, 21 U.S.C. section 360bbb-3(b)(1), unless the authorization is terminated or revoked sooner. Performed at [facility], [place] [address] [place], [place] [address]   SARS CORONAVIRUS 2 (TAT 6-24 HRS) Nasopharyngeal Nasopharyngeal Swab      Status: None   Collection Time: 05/13/19  7:34 PM   Specimen: Nasopharyngeal Swab  Result Value Ref Range Status   SARS Coronavirus 2 NEGATIVE NEGATIVE Final    Comment: (NOTE) SARS-CoV-2 target nucleic acids are NOT DETECTED. The SARS-CoV-2 RNA is generally detectable in upper and lower respiratory specimens during the acute phase of infection. Negative results do not preclude SARS-CoV-2 infection, do not rule out co-infections with other pathogens, and should not be used as the sole basis for treatment or other patient management decisions. Negative results must be combined with clinical observations, patient history, and epidemiological information. The expected result is Negative. Fact Sheet for Patients: SugarRoll.be Fact Sheet for Healthcare Providers: https://www.woods-mathews.com/ This test is not yet approved or cleared by the [place] FDA and  has been authorized for detection and/or diagnosis of SARS-CoV-2 by FDA under an Emergency Use Authorization (EUA). This EUA will remain  in effect (meaning this test can be used) for the duration of the COVID-19 declaration under Section 56 4(b)(1) of the Act, 21 U.S.C. section 360bbb-3(b)(1), unless the authorization is terminated or revoked sooner. Performed at [facility], [place] [address] [place],  [address]          Radiology Studies: DG Chest Port 1 View  Result Date: 05/13/2019 CLINICAL DATA:  Shortness of breath. Additional history provided by technologist: Patient reports shortness of breath for 2 days, worsening today. Patient also reports productive cough with dark red blood and yellow sputum, prescribed antibiotics, former smoker, history of CHF and COPD, history of pulmonary embolism 2010. EXAM: PORTABLE CHEST 1 VIEW COMPARISON:  Chest radiograph 10/02/2018. FINDINGS: Moderate cardiomegaly with central pulmonary vascular congestion, similar to prior  examination. No frank pulmonary edema. No evidence of airspace consolidation within the lungs. No sizable pleural effusion or evidence of pneumothorax. No acute bony abnormality. Overlying cardiac monitoring leads. IMPRESSION: Moderate cardiomegaly with pulmonary vascular congestion, similar to prior exam 10/02/2018. No overt pulmonary edema or evidence of pneumonia. Electronically Signed   By: Kellie Simmering DO   On: 05/13/2019 17:39        Scheduled Meds: . allopurinol  150 mg Oral Daily  . amoxicillin-clavulanate  1 tablet Oral BID  . carvedilol  6.25 mg Oral BID WC  . cloNIDine  0.1 mg Oral TID  . famotidine  20 mg Oral QHS  . flecainide  100 mg Oral BID  . furosemide  80 mg Intravenous BID  . hydrALAZINE  100 mg Oral TID  . mometasone-formoterol  2 puff Inhalation BID  . montelukast  10 mg Oral QHS  . pantoprazole  40 mg Oral BID AC  . rivaroxaban  20 mg Oral Q supper   Continuous Infusions:   LOS: 1 day    Time spent: 35 minutes. Greater than 50% of this time was spent in direct contact with  the patient, coordinating care and discussing relevant ongoing clinical issues.     Lelon Frohlich, MD [facility] Pager [phone]  If 7PM-7AM, please contact night-coverage [email or web address] Password [email or web address] 05/14/2019, 9:35 AM

## 2019-05-15 DIAGNOSIS — E785 Hyperlipidemia, unspecified: Secondary | ICD-10-CM

## 2019-05-15 LAB — CBC
HCT: 34.9 % — ABNORMAL LOW (ref 39.0–52.0)
Hemoglobin: 10.5 g/dL — ABNORMAL LOW (ref 13.0–17.0)
MCH: 27.9 pg (ref 26.0–34.0)
MCHC: 30.1 g/dL (ref 30.0–36.0)
MCV: 92.6 fL (ref 80.0–100.0)
Platelets: 139 10*3/uL — ABNORMAL LOW (ref 150–400)
RBC: 3.77 MIL/uL — ABNORMAL LOW (ref 4.22–5.81)
RDW: 15.2 % (ref 11.5–15.5)
WBC: 4.6 10*3/uL (ref 4.0–10.5)
nRBC: 0 % (ref 0.0–0.2)

## 2019-05-15 LAB — LIPID PANEL
Cholesterol: 152 mg/dL (ref 0–200)
HDL: 44 mg/dL (ref >40)
LDL Cholesterol: 100 mg/dL — ABNORMAL HIGH (ref 0–99)
Total CHOL/HDL Ratio: 3.5 RATIO
Triglycerides: 41 mg/dL (ref <150)
VLDL: 8 mg/dL (ref 0–40)

## 2019-05-15 LAB — BASIC METABOLIC PANEL
Anion gap: 7 (ref 5–15)
BUN: 33 mg/dL — ABNORMAL HIGH (ref 6–20)
CO2: 29 mmol/L (ref 22–32)
Calcium: 8.5 mg/dL — ABNORMAL LOW (ref 8.9–10.3)
Chloride: 107 mmol/L (ref 98–111)
Creatinine, Ser: 2.6 mg/dL — ABNORMAL HIGH (ref 0.61–1.24)
GFR calc Af Amer: 31 mL/min — ABNORMAL LOW (ref >60)
GFR calc non Af Amer: 26 mL/min — ABNORMAL LOW (ref >60)
Glucose, Bld: 100 mg/dL — ABNORMAL HIGH (ref 70–99)
Potassium: 3.6 mmol/L (ref 3.5–5.1)
Sodium: 143 mmol/L (ref 135–145)

## 2019-05-15 LAB — HIV ANTIBODY (ROUTINE TESTING W REFLEX): HIV Screen 4th Generation wRfx: NONREACTIVE — AB

## 2019-05-15 LAB — GLUCOSE, CAPILLARY: Glucose-Capillary: 93 mg/dL (ref 70–99)

## 2019-05-15 MED ORDER — HYDRALAZINE HCL 25 MG PO TABS: 25.0000 mg | ORAL_TABLET | Freq: Four times a day (QID) | ORAL | Status: DC | PRN

## 2019-05-15 MED ORDER — ATORVASTATIN CALCIUM 10 MG PO TABS
10.0000 mg | ORAL_TABLET | Freq: Every day | ORAL | Status: DC
  Administered 2019-05-15 – 2019-05-16 (×2): 10 mg via ORAL
  Filled 2019-05-15 (×2): qty 1

## 2019-05-15 NOTE — Progress Notes (Signed)
PROGRESS NOTE    Phillip [name]  GLO:[id number] DOB: 30-Mar-1960 DOA: 05/13/2019 PCP: [name], MD     Brief Narrative:  59 year old man admitted from home on 2/19 due to shortness of breath.  His past medical history significant for paroxysmal A. fib/a flutter status post ablation in 2019, remote history of PE/DVT, morbid obesity, obstructive sleep apnea on CPAP, chronic kidney disease stage III, hypertension as well as chronic combined systolic and diastolic heart failure.  He is status post DCCV this past Monday and his shortness of breath has been increasing since this procedure.  He was found to be in acute congestive heart failure and admission was requested for further evaluation and management.   Assessment & Plan:   Principal Problem:   Acute on chronic combined systolic and diastolic CHF (congestive heart failure) (HCC) Active Problems:   OSA on CPAP   Essential hypertension   Morbid obesity due to excess calories (HCC)   Paroxysmal atrial fibrillation ([place])   Long term (current) use of anticoagulants [Z79.01]   Hx pulmonary embolism   CKD (chronic kidney disease) stage 3, GFR 30-59 ml/min   ARF (acute renal failure) (HCC)   Acute on chronic combined heart failure -2D echo from November 2020 shows ejection fraction of 45 to 50% with grade 2 diastolic dysfunction. -He remains on Lasix 80 mg IV every 12 hours. -2.23 L negative since admission. -Weight has decreased from 174.6 to 172.5 kg in the past 24 hours. -Remains on Coreg, no ACE inhibitor/ARB/ARN I due to advanced chronic kidney disease. -Check lipids for restratification and consider starting statin.  Hyperlipidemia -Goal <70, LDL is 100. -Start lipitor 10 mg.  Acute on chronic kidney disease stage III -Baseline creatinine appears to be around 2.1. -Was 2.9 on admission and down to 2.78 2/20 and to 2.60 on 2/21. -Continue to follow renal function on a daily basis.  Paroxysmal atrial fibrillation -Status  post DCCV earlier this week, currently in normal sinus rhythm, rates little low. -Is also on flecainide for rhythm control. -On Xarelto for anticoagulation purposes.  Morbid obesity -Body mass index is 56.16 kg/m. -Noted.  History of hypertension -BP a little elevated, follow. -Do not anticipate medication changes while hospitalized.  Obstructive sleep apnea -On nightly CPAP.   DVT prophylaxis: Anticoagulated on Xarelto Code Status: Full code Family Communication: Patient only Disposition Plan: Anticipate need to remain inpatient for another 24 to 48 hours to ensure adequate diuresis  Consultants:   None  Procedures:   None  Antimicrobials:  Anti-infectives (From admission, onward)   Start     Dose/Rate Route Frequency Ordered Stop   05/13/19 2215  amoxicillin-clavulanate (AUGMENTIN) 875-125 MG per tablet 1 tablet  Status:  Discontinued     1 tablet Oral 2 times daily 05/13/19 2202 05/14/19 0940       Subjective: Sitting at bedside, still describes a little shortness of breath especially with activity, no chest pain. He notes that DOE is improved as compared to yesterday.  Objective: Vitals:   05/14/19 2258 05/15/19 0449 05/15/19 0500 05/15/19 0750  BP: (!) 133/48 (!) 156/75    Pulse:  62  64  Resp:  18  (!) 22  Temp:  97.8 F (36.6 C)    TempSrc:  Oral    SpO2:  95%  95%  Weight:   (!) 172.5 kg   Height:        Intake/Output Summary (Last 24 hours) at 05/15/2019 1006 Last data filed at 05/15/2019 0451 Gross  per 24 hour  Intake 120 ml  Output 2050 ml  Net -1930 ml   [employer]   05/13/19 1634 05/14/19 0958 05/15/19 0500  Weight: (!) 173.7 kg (!) 174.6 kg (!) 172.5 kg    Examination:  General exam: Alert, awake, oriented x 3, morbidly obese Respiratory system: mild crackles to bilateral bases, respiratory effort normal. Cardiovascular system:RRR. No murmurs, rubs, gallops. Gastrointestinal system: Abdomen is obese, nondistended, soft and  nontender. No organomegaly or masses felt. Normal bowel sounds heard. Central nervous system: Alert and oriented. No focal neurological deficits. Extremities: trace pitting edema bilaterally to mid shins, +pedal pulses Skin: No rashes, lesions or ulcers Psychiatry: Judgement and insight appear normal. Mood & affect appropriate.     Data Reviewed: I have personally reviewed following labs and imaging studies  CBC: Recent Labs  Lab 05/09/19 1029 05/13/19 1706 05/14/19 0306 05/15/19 0403  WBC 5.3 5.8 4.8 4.6  NEUTROABS  --  4.6  --   --   HGB 12.1* 11.4* 10.7* 10.5*  HCT 40.7 38.4* 35.0* 34.9*  MCV 93.8 93.2 93.1 92.6  PLT 187 150 131* 678*   Basic Metabolic Panel: Recent Labs  Lab 05/09/19 1029 05/13/19 1706 05/14/19 0306 05/15/19 0403  NA 144 143 142 143  K 3.8 4.4 3.9 3.6  CL 106 107 106 107  CO2 25 28 28 29   GLUCOSE 157* 106* 98 100*  BUN 33* 34* 34* 33*  CREATININE 2.50* 2.91* 2.78* 2.60*  CALCIUM 9.1 8.9 8.7* 8.5*   GFR: Estimated Creatinine Clearance: 50 mL/min (A) (by C-G formula based on SCr of 2.6 mg/dL (H)). Liver Function Tests: No results for input(s): AST, ALT, ALKPHOS, BILITOT, PROT, ALBUMIN in the last 168 hours. No results for input(s): LIPASE, AMYLASE in the last 168 hours. No results for input(s): AMMONIA in the last 168 hours. Coagulation Profile: Recent Labs  Lab 05/09/19 1029  INR 1.6*   Cardiac Enzymes: No results for input(s): CKTOTAL, CKMB, CKMBINDEX, TROPONINI in the last 168 hours. BNP (last 3 results) No results for input(s): PROBNP in the last 8760 hours. HbA1C: No results for input(s): HGBA1C in the last 72 hours. CBG: Recent Labs  Lab 05/14/19 0813 05/15/19 0738  GLUCAP 85 93   Lipid Profile: Recent Labs    05/15/19 0403  CHOL 152  HDL 44  LDLCALC 100*  TRIG 41  CHOLHDL 3.5   Thyroid Function Tests: No results for input(s): TSH, T4TOTAL, FREET4, T3FREE, THYROIDAB in the last 72 hours. Anemia Panel: No results  for input(s): VITAMINB12, FOLATE, FERRITIN, TIBC, IRON, RETICCTPCT in the last 72 hours. Urine analysis:    Component Value Date/Time   COLORURINE STRAW (A) 09/17/2017 2350   APPEARANCEUR CLEAR 09/17/2017 2350   LABSPEC 1.009 09/17/2017 2350   PHURINE 5.0 09/17/2017 2350   GLUCOSEU NEGATIVE 09/17/2017 2350   HGBUR NEGATIVE 09/17/2017 2350   BILIRUBINUR NEGATIVE 09/17/2017 2350   KETONESUR NEGATIVE 09/17/2017 2350   PROTEINUR NEGATIVE 09/17/2017 2350   UROBILINOGEN 1.0 12/04/2014 0546   NITRITE NEGATIVE 09/17/2017 2350   LEUKOCYTESUR NEGATIVE 09/17/2017 2350   Sepsis Labs: @LABRCNTIP (procalcitonin:4,lacticidven:4)  ) Recent Results (from the past 240 hour(s))  SARS CORONAVIRUS 2 (TAT 6-24 HRS) Nasopharyngeal Nasopharyngeal Swab     Status: None   Collection Time: 05/09/19 10:58 AM   Specimen: Nasopharyngeal Swab  Result Value Ref Range Status   SARS Coronavirus 2 NEGATIVE NEGATIVE Final    Comment: (NOTE) SARS-CoV-2 target nucleic acids are NOT DETECTED. The SARS-CoV-2 RNA is generally  detectable in upper and lower respiratory specimens during the acute phase of infection. Negative results do not preclude SARS-CoV-2 infection, do not rule out co-infections with other pathogens, and should not be used as the sole basis for treatment or other patient management decisions. Negative results must be combined with clinical observations, patient history, and epidemiological information. The expected result is Negative. Fact Sheet for Patients: SugarRoll.be Fact Sheet for Healthcare Providers: https://www.woods-mathews.com/ This test is not yet approved or cleared by the [place] FDA and  has been authorized for detection and/or diagnosis of SARS-CoV-2 by FDA under an Emergency Use Authorization (EUA). This EUA will remain  in effect (meaning this test can be used) for the duration of the COVID-19 declaration under Section 56 4(b)(1) of  the Act, 21 U.S.C. section 360bbb-3(b)(1), unless the authorization is terminated or revoked sooner. Performed at [facility], [place] [address], [place], [place] [address]   SARS CORONAVIRUS 2 (TAT 6-24 HRS) Nasopharyngeal Nasopharyngeal Swab     Status: None   Collection Time: 05/13/19  7:34 PM   Specimen: Nasopharyngeal Swab  Result Value Ref Range Status   SARS Coronavirus 2 NEGATIVE NEGATIVE Final    Comment: (NOTE) SARS-CoV-2 target nucleic acids are NOT DETECTED. The SARS-CoV-2 RNA is generally detectable in upper and lower respiratory specimens during the acute phase of infection. Negative results do not preclude SARS-CoV-2 infection, do not rule out co-infections with other pathogens, and should not be used as the sole basis for treatment or other patient management decisions. Negative results must be combined with clinical observations, patient history, and epidemiological information. The expected result is Negative. Fact Sheet for Patients: SugarRoll.be Fact Sheet for Healthcare Providers: https://www.woods-mathews.com/ This test is not yet approved or cleared by the [place] FDA and  has been authorized for detection and/or diagnosis of SARS-CoV-2 by FDA under an Emergency Use Authorization (EUA). This EUA will remain  in effect (meaning this test can be used) for the duration of the COVID-19 declaration under Section 56 4(b)(1) of the Act, 21 U.S.C. section 360bbb-3(b)(1), unless the authorization is terminated or revoked sooner. Performed at [facility], [place] [address], [place], [place] [address]          Radiology Studies: DG Chest Port 1 View  Result Date: 05/13/2019 CLINICAL DATA:  Shortness of breath. Additional history provided by technologist: Patient reports shortness of breath for 2 days, worsening today. Patient also reports productive cough with dark red blood and yellow sputum, prescribed  antibiotics, former smoker, history of CHF and COPD, history of pulmonary embolism 2010. EXAM: PORTABLE CHEST 1 VIEW COMPARISON:  Chest radiograph 10/02/2018. FINDINGS: Moderate cardiomegaly with central pulmonary vascular congestion, similar to prior examination. No frank pulmonary edema. No evidence of airspace consolidation within the lungs. No sizable pleural effusion or evidence of pneumothorax. No acute bony abnormality. Overlying cardiac monitoring leads. IMPRESSION: Moderate cardiomegaly with pulmonary vascular congestion, similar to prior exam 10/02/2018. No overt pulmonary edema or evidence of pneumonia. Electronically Signed   By: Kellie Simmering DO   On: 05/13/2019 17:39        Scheduled Meds: . allopurinol  150 mg Oral Daily  . carvedilol  6.25 mg Oral BID WC  . cloNIDine  0.1 mg Oral TID  . famotidine  20 mg Oral QHS  . flecainide  100 mg Oral BID  . furosemide  80 mg Intravenous BID  . hydrALAZINE  100 mg Oral TID  . mometasone-formoterol  2 puff Inhalation  BID  . montelukast  10 mg Oral QHS  . pantoprazole  40 mg Oral BID AC  . rivaroxaban  15 mg Oral Q supper   Continuous Infusions:   LOS: 2 days    Time spent: 25 minutes. Greater than 50% of this time was spent in direct contact with the patient, coordinating care and discussing relevant ongoing clinical issues.     Lelon Frohlich, MD [facility] Pager [phone]  If 7PM-7AM, please contact night-coverage [email or web address] Password [address] [facility] 05/15/2019, 10:06 AM

## 2019-05-16 ENCOUNTER — Ambulatory Visit (HOSPITAL_COMMUNITY): Admit: 2019-05-16 | Payer: Managed Care, Other (non HMO) | Admitting: Internal Medicine

## 2019-05-16 ENCOUNTER — Encounter (HOSPITAL_COMMUNITY): Payer: Self-pay

## 2019-05-16 LAB — BASIC METABOLIC PANEL
Anion gap: 8 (ref 5–15)
BUN: 31 mg/dL — ABNORMAL HIGH (ref 6–20)
CO2: 30 mmol/L (ref 22–32)
Calcium: 8.7 mg/dL — ABNORMAL LOW (ref 8.9–10.3)
Chloride: 107 mmol/L (ref 98–111)
Creatinine, Ser: 2.57 mg/dL — ABNORMAL HIGH (ref 0.61–1.24)
GFR calc Af Amer: 31 mL/min — ABNORMAL LOW (ref >60)
GFR calc non Af Amer: 27 mL/min — ABNORMAL LOW (ref >60)
Glucose, Bld: 101 mg/dL — ABNORMAL HIGH (ref 70–99)
Potassium: 3.4 mmol/L — ABNORMAL LOW (ref 3.5–5.1)
Sodium: 145 mmol/L (ref 135–145)

## 2019-05-16 LAB — GLUCOSE, CAPILLARY: Glucose-Capillary: 85 mg/dL (ref 70–99)

## 2019-05-16 SURGERY — CARDIOVERSION
Anesthesia: General

## 2019-05-16 MED ORDER — POTASSIUM CHLORIDE CRYS ER 20 MEQ PO TBCR
40.0000 meq | EXTENDED_RELEASE_TABLET | ORAL | Status: AC
  Administered 2019-05-16 (×2): 40 meq via ORAL
  Filled 2019-05-16: qty 2

## 2019-05-16 NOTE — Progress Notes (Signed)
PROGRESS NOTE    Phillip [name]  WFU:[id number] DOB: 1961/03/02 DOA: 05/13/2019 PCP: [name], MD     Brief Narrative:  59 year old man admitted from home on 2/19 due to shortness of breath.  His past medical history significant for paroxysmal A. fib/a flutter status post ablation in 2019, remote history of PE/DVT, morbid obesity, obstructive sleep apnea on CPAP, chronic kidney disease stage III, hypertension as well as chronic combined systolic and diastolic heart failure.  He is status post DCCV this past Monday and his shortness of breath has been increasing since this procedure.  He was found to be in acute congestive heart failure and admission was requested for further evaluation and management.   Assessment & Plan:   Principal Problem:   Acute on chronic combined systolic and diastolic CHF (congestive heart failure) (HCC) Active Problems:   OSA on CPAP   Essential hypertension   Morbid obesity due to excess calories (HCC)   Paroxysmal atrial fibrillation ([place])   Long term (current) use of anticoagulants [Z79.01]   Hx pulmonary embolism   CKD (chronic kidney disease) stage 3, GFR 30-59 ml/min   ARF (acute renal failure) (HCC)   Hyperlipidemia LDL goal <70   Acute on chronic combined heart failure -2D echo from November 2020 shows ejection fraction of 45 to 50% with grade 2 diastolic dysfunction. -He remains on Lasix 80 mg IV every 12 hours. -3650 cc negative since admission. -Remains on Coreg, no ACE inhibitor/ARB/ARN I due to advanced chronic kidney disease.  Hyperlipidemia -Goal <70, LDL is 100. -Start lipitor 10 mg.  Acute on chronic kidney disease stage III -Baseline creatinine appears to be around 2.1. -Was 2.9 on admission and down to 2.78 on 2/20, 2.60 on 2/21, and 2.57 on 2/22. -Continue to follow renal function on a daily basis.  Paroxysmal atrial fibrillation -Status post DCCV earlier this week, currently in normal sinus rhythm, rates little low. -Is  also on flecainide for rhythm control. -On Xarelto for anticoagulation purposes.  Morbid obesity -Body mass index is 56.54 kg/m. -Noted.  History of hypertension -BP a little elevated, follow. -Do not anticipate medication changes while hospitalized.  Obstructive sleep apnea -On nightly CPAP.   DVT prophylaxis: Anticoagulated on Xarelto Code Status: Full code Family Communication: Patient only Disposition Plan: Anticipate need to remain inpatient for another 24 hours to ensure adequate diuresis. Plan for DC home on 2/23.  Consultants:   None  Procedures:   None  Antimicrobials:  Anti-infectives (From admission, onward)   Start     Dose/Rate Route Frequency Ordered Stop   05/13/19 2215  amoxicillin-clavulanate (AUGMENTIN) 875-125 MG per tablet 1 tablet  Status:  Discontinued     1 tablet Oral 2 times daily 05/13/19 2202 05/14/19 0940       Subjective: Sitting at bedside, DOE is improved. Denies CP.  Objective: Vitals:   05/15/19 1856 05/15/19 2050 05/16/19 0551 05/16/19 0844  BP:  (!) 141/70 (!) 170/76   Pulse:  (!) 59 (!) 55   Resp:  20 20   Temp:  98.1 F (36.7 C) 98.3 F (36.8 C)   TempSrc:  Oral Oral   SpO2: 93% 96% 92% 91%  Weight:   (!) 173.7 kg   Height:        Intake/Output Summary (Last 24 hours) at 05/16/2019 0859 Last data filed at 05/16/2019 0730 Gross per 24 hour  Intake 480 ml  Output 2100 ml  Net -1620 ml   Filed Weights   05/14/19  1962 05/15/19 0500 05/16/19 0551  Weight: (!) 174.6 kg (!) 172.5 kg (!) 173.7 kg    Examination:  General exam: Alert, awake, oriented x 3, morbidly obese Respiratory system: Lungs are CTA, respiratory effort normal. Cardiovascular system:RRR. No murmurs, rubs, gallops. Gastrointestinal system: Abdomen is obese, nondistended, soft and nontender. No organomegaly or masses felt. Normal bowel sounds heard. Central nervous system: Alert and oriented. No focal neurological deficits. Extremities: 1-2+ pitting  edema bilaterally to mid shins, +pedal pulses Skin: No rashes, lesions or ulcers Psychiatry: Judgement and insight appear normal. Mood & affect appropriate.     Data Reviewed: I have personally reviewed following labs and imaging studies  CBC: Recent Labs  Lab 05/09/19 1029 05/13/19 1706 05/14/19 0306 05/15/19 0403  WBC 5.3 5.8 4.8 4.6  NEUTROABS  --  4.6  --   --   HGB 12.1* 11.4* 10.7* 10.5*  HCT 40.7 38.4* 35.0* 34.9*  MCV 93.8 93.2 93.1 92.6  PLT 187 150 131* 229*   Basic Metabolic Panel: Recent Labs  Lab 05/09/19 1029 05/13/19 1706 05/14/19 0306 05/15/19 0403 05/16/19 0350  NA 144 143 142 143 145  K 3.8 4.4 3.9 3.6 3.4*  CL 106 107 106 107 107  CO2 25 28 28 29 30   GLUCOSE 157* 106* 98 100* 101*  BUN 33* 34* 34* 33* 31*  CREATININE 2.50* 2.91* 2.78* 2.60* 2.57*  CALCIUM 9.1 8.9 8.7* 8.5* 8.7*   GFR: Estimated Creatinine Clearance: 50.8 mL/min (A) (by C-G formula based on SCr of 2.57 mg/dL (H)). Liver Function Tests: No results for input(s): AST, ALT, ALKPHOS, BILITOT, PROT, ALBUMIN in the last 168 hours. No results for input(s): LIPASE, AMYLASE in the last 168 hours. No results for input(s): AMMONIA in the last 168 hours. Coagulation Profile: Recent Labs  Lab 05/09/19 1029  INR 1.6*   Cardiac Enzymes: No results for input(s): CKTOTAL, CKMB, CKMBINDEX, TROPONINI in the last 168 hours. BNP (last 3 results) No results for input(s): PROBNP in the last 8760 hours. HbA1C: No results for input(s): HGBA1C in the last 72 hours. CBG: Recent Labs  Lab 05/14/19 0813 05/15/19 0738 05/16/19 0739  GLUCAP 85 93 85   Lipid Profile: Recent Labs    05/15/19 0403  CHOL 152  HDL 44  LDLCALC 100*  TRIG 41  CHOLHDL 3.5   Thyroid Function Tests: No results for input(s): TSH, T4TOTAL, FREET4, T3FREE, THYROIDAB in the last 72 hours. Anemia Panel: No results for input(s): VITAMINB12, FOLATE, FERRITIN, TIBC, IRON, RETICCTPCT in the last 72 hours. Urine  analysis:    Component Value Date/Time   COLORURINE STRAW (A) 09/17/2017 2350   APPEARANCEUR CLEAR 09/17/2017 2350   LABSPEC 1.009 09/17/2017 2350   PHURINE 5.0 09/17/2017 2350   GLUCOSEU NEGATIVE 09/17/2017 2350   HGBUR NEGATIVE 09/17/2017 2350   BILIRUBINUR NEGATIVE 09/17/2017 2350   KETONESUR NEGATIVE 09/17/2017 2350   PROTEINUR NEGATIVE 09/17/2017 2350   UROBILINOGEN 1.0 12/04/2014 0546   NITRITE NEGATIVE 09/17/2017 2350   LEUKOCYTESUR NEGATIVE 09/17/2017 2350   Sepsis Labs: @LABRCNTIP (procalcitonin:4,lacticidven:4)  ) Recent Results (from the past 240 hour(s))  SARS CORONAVIRUS 2 (TAT 6-24 HRS) Nasopharyngeal Nasopharyngeal Swab     Status: None   Collection Time: 05/09/19 10:58 AM   Specimen: Nasopharyngeal Swab  Result Value Ref Range Status   SARS Coronavirus 2 NEGATIVE NEGATIVE Final    Comment: (NOTE) SARS-CoV-2 target nucleic acids are NOT DETECTED. The SARS-CoV-2 RNA is generally detectable in upper and lower respiratory specimens during the acute phase  of infection. Negative results do not preclude SARS-CoV-2 infection, do not rule out co-infections with other pathogens, and should not be used as the sole basis for treatment or other patient management decisions. Negative results must be combined with clinical observations, patient history, and epidemiological information. The expected result is Negative. Fact Sheet for Patients: SugarRoll.be Fact Sheet for Healthcare Providers: https://www.woods-mathews.com/ This test is not yet approved or cleared by the [place] FDA and  has been authorized for detection and/or diagnosis of SARS-CoV-2 by FDA under an Emergency Use Authorization (EUA). This EUA will remain  in effect (meaning this test can be used) for the duration of the COVID-19 declaration under Section 56 4(b)(1) of the Act, 21 U.S.C. section 360bbb-3(b)(1), unless the authorization is terminated or revoked  sooner. Performed at [facility], [place] [address], [place], [place] [address]   SARS CORONAVIRUS 2 (TAT 6-24 HRS) Nasopharyngeal Nasopharyngeal Swab     Status: None   Collection Time: 05/13/19  7:34 PM   Specimen: Nasopharyngeal Swab  Result Value Ref Range Status   SARS Coronavirus 2 NEGATIVE NEGATIVE Final    Comment: (NOTE) SARS-CoV-2 target nucleic acids are NOT DETECTED. The SARS-CoV-2 RNA is generally detectable in upper and lower respiratory specimens during the acute phase of infection. Negative results do not preclude SARS-CoV-2 infection, do not rule out co-infections with other pathogens, and should not be used as the sole basis for treatment or other patient management decisions. Negative results must be combined with clinical observations, patient history, and epidemiological information. The expected result is Negative. Fact Sheet for Patients: SugarRoll.be Fact Sheet for Healthcare Providers: https://www.woods-mathews.com/ This test is not yet approved or cleared by the [place] FDA and  has been authorized for detection and/or diagnosis of SARS-CoV-2 by FDA under an Emergency Use Authorization (EUA). This EUA will remain  in effect (meaning this test can be used) for the duration of the COVID-19 declaration under Section 56 4(b)(1) of the Act, 21 U.S.C. section 360bbb-3(b)(1), unless the authorization is terminated or revoked sooner. Performed at [facility], [place] [address], [place], [place] [address]          Radiology Studies: No results found.      Scheduled Meds: . allopurinol  150 mg Oral Daily  . atorvastatin  10 mg Oral q1800  . carvedilol  6.25 mg Oral BID WC  . cloNIDine  0.1 mg Oral TID  . famotidine  20 mg Oral QHS  . flecainide  100 mg Oral BID  . furosemide  80 mg Intravenous BID  . hydrALAZINE  100 mg Oral TID  . mometasone-formoterol  2 puff Inhalation BID  . montelukast   10 mg Oral QHS  . pantoprazole  40 mg Oral BID AC  . potassium chloride  40 mEq Oral Q4H  . rivaroxaban  15 mg Oral Q supper   Continuous Infusions:   LOS: 3 days    Time spent: 25 minutes. Greater than 50% of this time was spent in direct contact with the patient, coordinating care and discussing relevant ongoing clinical issues.     Lelon Frohlich, MD [facility] Pager [phone]  If 7PM-7AM, please contact night-coverage [email or web address] Password [facility] 05/16/2019, 8:59 AM

## 2019-05-16 NOTE — TOC Progression Note (Signed)
Transition of Care [facility]) - Progression Note    Patient Details  Name: Phillip [name] MRN: [id number] Date of Birth: 1960-08-07  [facility] CM/SW Contact  Purcell Mouton, RN Phone Number: 05/16/2019, 3:41 PM  Clinical Narrative:     Pt plan to discharge home. TOC will continue to follow for discharge needs.   Expected Discharge Plan: Home/Self Care Barriers to Discharge: No Barriers Identified  Expected Discharge Plan and Services Expected Discharge Plan: Home/Self Care       Living arrangements for the past 2 months: Single Family Home                                       Social Determinants of Health (SDOH) Interventions    Readmission Risk Interventions No flowsheet data found.

## 2019-05-17 ENCOUNTER — Telehealth: Payer: Self-pay

## 2019-05-17 LAB — BASIC METABOLIC PANEL
Anion gap: 8 (ref 5–15)
BUN: 31 mg/dL — ABNORMAL HIGH (ref 6–20)
CO2: 29 mmol/L (ref 22–32)
Calcium: 8.5 mg/dL — ABNORMAL LOW (ref 8.9–10.3)
Chloride: 106 mmol/L (ref 98–111)
Creatinine, Ser: 2.39 mg/dL — ABNORMAL HIGH (ref 0.61–1.24)
GFR calc Af Amer: 34 mL/min — ABNORMAL LOW (ref >60)
GFR calc non Af Amer: 29 mL/min — ABNORMAL LOW (ref >60)
Glucose, Bld: 104 mg/dL — ABNORMAL HIGH (ref 70–99)
Potassium: 3.4 mmol/L — ABNORMAL LOW (ref 3.5–5.1)
Sodium: 143 mmol/L (ref 135–145)

## 2019-05-17 LAB — GLUCOSE, CAPILLARY: Glucose-Capillary: 98 mg/dL (ref 70–99)

## 2019-05-17 MED ORDER — POTASSIUM CHLORIDE CRYS ER 20 MEQ PO TBCR
40.0000 meq | EXTENDED_RELEASE_TABLET | ORAL | Status: DC
  Administered 2019-05-17: 40 meq via ORAL
  Filled 2019-05-17: qty 2

## 2019-05-17 MED ORDER — ATORVASTATIN CALCIUM 10 MG PO TABS: 10.0000 mg | ORAL_TABLET | Freq: Every day | ORAL | 0 refills | Status: DC

## 2019-05-17 MED ORDER — RIVAROXABAN 15 MG PO TABS: 15.0000 mg | ORAL_TABLET | Freq: Every day | ORAL | 1 refills | Status: DC

## 2019-05-17 NOTE — Telephone Encounter (Signed)
May need to make in-person visit. Seeing Cardiology 1 week before.   DC summary mentions need to assess fluid status and obtain labs.

## 2019-05-17 NOTE — Discharge Summary (Signed)
Physician Discharge Summary  Phillip [name] KGM:[id number] DOB: Mar 11, 1961 DOA: 05/13/2019  PCP: [name], MD  Admit date: 05/13/2019 Discharge date: 05/17/2019  Time spent: 45 minutes  Recommendations for Outpatient Follow-up:  -To be discharged home today. -Advised follow-up with primary care physician and with cardiologist within 2 weeks to evaluate fluid status and electrolytes.  Discharge Diagnoses:  Principal Problem:   Acute on chronic combined systolic and diastolic CHF (congestive heart failure) (HCC) Active Problems:   OSA on CPAP   Essential hypertension   Morbid obesity due to excess calories (HCC)   Paroxysmal atrial fibrillation ([place])   Long term (current) use of anticoagulants [Z79.01]   Hx pulmonary embolism   CKD (chronic kidney disease) stage 3, GFR 30-59 ml/min   ARF (acute renal failure) ([place])   Hyperlipidemia LDL goal <70   Discharge Condition: Stable and improved  Filed Weights   05/15/19 0500 05/16/19 0551 05/17/19 0500  Weight: (!) 172.5 kg (!) 173.7 kg (!) 175.8 kg    History of present illness:  As per Dr. Andria Frames on 2/19: Phillip [name] is a 59 y.o. male with medical history significant of paroxysmal atrial fibrillation/atrial flutter status post ablation May 2019, remote history of PE/DVT, OSA on CPAP, morbid obesity, CKD 3, hypertension, chronic combined systolic and diastolic heart failure, and recent DC-CV this past Monday who presents with worsening shortness of breath since procedure.  Patient reports he underwent his third cardioversion attempt on Monday and since then he has been having shortness of breath.  He reports that he has gained 4 pounds since that time.  He states he cannot walk more than 10 feet without getting winded.  He has been on torsemide at home and has been compliant with his medications.  He denies any fevers, chills, diarrhea, anosmia, and myalgias.  He was tested for Covid preprocedure and it was negative.  He states  in the past he has felt like this and take an additional dose of torsemide which has helped.  He has been compliant with all of his medications has just not taken his evening doses.  Only medication he has not taken his prednisone which was recently prescribed.  However he has not noticed any wheezing.  He has been compliant with his antibiotics have been on azithromycin and now is on Augmentin.  He does have some nausea and vomited one time.  He denies any chest pain, palpitations.  He is unsure if he has had any worsening orthopnea but he does report sleeping on his side and uses CPAP at night.  He is not on any oxygen at home.  He states he is only intermittently needed at times.  When ambulated in the room he says his oxygen dropped to 84%.  When he is feeling at his best he feels that he does not have any limitation to his exercise tolerance.  He denies smoking, alcohol, or drug use   Hospital Course:   Acute on chronic combined heart failure -2D echo from November 2020 shows ejection fraction of 45 to 50% with grade 2 diastolic dysfunction. -He was on Lasix 80 mg IV every 12 hours. -4340 cc negative since admission. -Remains on Coreg, no ACE inhibitor/ARB/ARN I due to advanced chronic kidney disease. -His shortness of breath has improved, he is able to ambulate to the bathroom back without any dyspnea.  He feels ready for discharge home today.  Hyperlipidemia -Goal <70, LDL is 100. -Start lipitor 10 mg.  Acute on  chronic kidney disease stage III -Baseline creatinine appears to be around 2.1. -Was 2.9 on admission and down to 2.78 on 2/20, 2.60 on 2/21,  2.57 on 2/22 and 2.39 on DC.  Paroxysmal atrial fibrillation -Status post DCCV earlier this week, currently in normal sinus rhythm, rates little low. -Is also on flecainide for rhythm control. -On Xarelto for anticoagulation purposes. Dose has been reduced from 20 to 15 mg daily due to creatinine clearance.  Morbid  obesity -Body mass index is 56.54 kg/m. -Noted.  History of hypertension -Fair control while hospitalized.  Obstructive sleep apnea -On nightly CPAP.  Procedures:  None   Consultations:  None  Discharge Instructions  Discharge Instructions    Diet - low sodium heart healthy   Complete by: As directed    Increase activity slowly   Complete by: As directed      Allergies as of 05/17/2019      Reactions   Other Anaphylaxis   mushrooms      Medication List    STOP taking these medications   amoxicillin-clavulanate 875-125 MG tablet Commonly known as: AUGMENTIN   colchicine 0.6 MG tablet   naproxen sodium 220 MG tablet Commonly known as: ALEVE   OXYGEN   predniSONE 20 MG tablet Commonly known as: DELTASONE     TAKE these medications   albuterol 108 (90 Base) MCG/ACT inhaler Commonly known as: ProAir HFA Inhale 2 puffs into the lungs every 4 (four) hours as needed for wheezing or shortness of breath. What changed: Another medication with the same name was changed. Make sure you understand how and when to take each.   albuterol (2.5 MG/3ML) 0.083% nebulizer solution Commonly known as: PROVENTIL INHALE 1 VIAL VIA NEBULIZER EVERY 6 HOURS AS NEEDED FOR WHEEZING/SHORTNESS OF BREATH. What changed: See the new instructions.   allopurinol 300 MG tablet Commonly known as: ZYLOPRIM Take 300 mg by mouth daily.   atorvastatin 10 MG tablet Commonly known as: LIPITOR Take 1 tablet (10 mg total) by mouth daily at 6 PM.   budesonide-formoterol 80-4.5 MCG/ACT inhaler Commonly known as: SYMBICORT Inhale 2 puffs into the lungs 2 (two) times daily.   carvedilol 6.25 MG tablet Commonly known as: COREG Take 6.25 mg by mouth 2 (two) times daily with a meal.   cloNIDine 0.1 MG tablet Commonly known as: CATAPRES Take 0.1 mg by mouth 3 (three) times daily.   famotidine 20 MG tablet Commonly known as: PEPCID Take 20 mg by mouth at bedtime.   flecainide 100 MG  tablet Commonly known as: TAMBOCOR Take 1 tablet (100 mg total) by mouth 2 (two) times daily.   hydrALAZINE 100 MG tablet Commonly known as: APRESOLINE Take 1 tablet (100 mg total) by mouth 3 (three) times daily.   montelukast 10 MG tablet Commonly known as: SINGULAIR Take 10 mg by mouth at bedtime.   oxymetazoline 0.05 % nasal spray Commonly known as: Afrin Nasal Spray Place 1 spray into both nostrils 2 (two) times daily as needed for congestion.   pantoprazole 40 MG tablet Commonly known as: PROTONIX Take 40 mg by mouth 2 (two) times daily before a meal.   potassium chloride SA 20 MEQ tablet Commonly known as: Klor-Con M20 Take 2 tablets (40 mEq total) by mouth every morning AND 1 tablet (20 mEq total) every evening.   Rivaroxaban 15 MG Tabs tablet Commonly known as: XARELTO Take 1 tablet (15 mg total) by mouth daily with supper. What changed:   medication strength  how much  to take   torsemide 20 MG tablet Commonly known as: DEMADEX Take 3 tablets (60 mg total) by mouth 2 (two) times daily. Take extra 1 tablet for weight gain What changed: additional instructions   UNABLE TO FIND Med Name: CPAP with sleep      Allergies  Allergen Reactions  . Other Anaphylaxis    mushrooms   Follow-up Information    [name] I, MD. Schedule an appointment as soon as possible for a visit in 2 week(s).   Specialties: Internal Medicine, Pediatrics Contact information: [place] [address] [phone]        cardiologist. Schedule an appointment as soon as possible for a visit in 2 week(s).            The results of significant diagnostics from this hospitalization (including imaging, microbiology, ancillary and laboratory) are listed below for reference.    Significant Diagnostic Studies: DG Chest Port 1 View  Result Date: 05/13/2019 CLINICAL DATA:  Shortness of breath. Additional history provided by technologist: Patient reports  shortness of breath for 2 days, worsening today. Patient also reports productive cough with dark red blood and yellow sputum, prescribed antibiotics, former smoker, history of CHF and COPD, history of pulmonary embolism 2010. EXAM: PORTABLE CHEST 1 VIEW COMPARISON:  Chest radiograph 10/02/2018. FINDINGS: Moderate cardiomegaly with central pulmonary vascular congestion, similar to prior examination. No frank pulmonary edema. No evidence of airspace consolidation within the lungs. No sizable pleural effusion or evidence of pneumothorax. No acute bony abnormality. Overlying cardiac monitoring leads. IMPRESSION: Moderate cardiomegaly with pulmonary vascular congestion, similar to prior exam 10/02/2018. No overt pulmonary edema or evidence of pneumonia. Electronically Signed   By: Kellie Simmering DO   On: 05/13/2019 17:39    Microbiology: Recent Results (from the past 240 hour(s))  SARS CORONAVIRUS 2 (TAT 6-24 HRS) Nasopharyngeal Nasopharyngeal Swab     Status: None   Collection Time: 05/09/19 10:58 AM   Specimen: Nasopharyngeal Swab  Result Value Ref Range Status   SARS Coronavirus 2 NEGATIVE NEGATIVE Final    Comment: (NOTE) SARS-CoV-2 target nucleic acids are NOT DETECTED. The SARS-CoV-2 RNA is generally detectable in upper and lower respiratory specimens during the acute phase of infection. Negative results do not preclude SARS-CoV-2 infection, do not rule out co-infections with other pathogens, and should not be used as the sole basis for treatment or other patient management decisions. Negative results must be combined with clinical observations, patient history, and epidemiological information. The expected result is Negative. Fact Sheet for Patients: SugarRoll.be Fact Sheet for Healthcare Providers: https://www.woods-mathews.com/ This test is not yet approved or cleared by the [place] FDA and  has been authorized for detection and/or diagnosis of  SARS-CoV-2 by FDA under an Emergency Use Authorization (EUA). This EUA will remain  in effect (meaning this test can be used) for the duration of the COVID-19 declaration under Section 56 4(b)(1) of the Act, 21 U.S.C. section 360bbb-3(b)(1), unless the authorization is terminated or revoked sooner. Performed at [facility], [place] [address] [place], [place] [address]   SARS CORONAVIRUS 2 (TAT 6-24 HRS) Nasopharyngeal Nasopharyngeal Swab     Status: None   Collection Time: 05/13/19  7:34 PM   Specimen: Nasopharyngeal Swab  Result Value Ref Range Status   SARS Coronavirus 2 NEGATIVE NEGATIVE Final    Comment: (NOTE) SARS-CoV-2 target nucleic acids are NOT DETECTED. The SARS-CoV-2 RNA is generally detectable in upper and lower respiratory specimens during the acute  phase of infection. Negative results do not preclude SARS-CoV-2 infection, do not rule out co-infections with other pathogens, and should not be used as the sole basis for treatment or other patient management decisions. Negative results must be combined with clinical observations, patient history, and epidemiological information. The expected result is Negative. Fact Sheet for Patients: SugarRoll.be Fact Sheet for Healthcare Providers: https://www.woods-mathews.com/ This test is not yet approved or cleared by the [place] FDA and  has been authorized for detection and/or diagnosis of SARS-CoV-2 by FDA under an Emergency Use Authorization (EUA). This EUA will remain  in effect (meaning this test can be used) for the duration of the COVID-19 declaration under Section 56 4(b)(1) of the Act, 21 U.S.C. section 360bbb-3(b)(1), unless the authorization is terminated or revoked sooner. Performed at [facility], [place] [address], [place], [place] [address]      Labs: Basic Metabolic Panel: Recent Labs  Lab 05/13/19 1706 05/14/19 0306 05/15/19 0403 05/16/19 0350  05/17/19 0255  NA 143 142 143 145 143  K 4.4 3.9 3.6 3.4* 3.4*  CL 107 106 107 107 106  CO2 28 28 29 30 29   GLUCOSE 106* 98 100* 101* 104*  BUN 34* 34* 33* 31* 31*  CREATININE 2.91* 2.78* 2.60* 2.57* 2.39*  CALCIUM 8.9 8.7* 8.5* 8.7* 8.5*   Liver Function Tests: No results for input(s): AST, ALT, ALKPHOS, BILITOT, PROT, ALBUMIN in the last 168 hours. No results for input(s): LIPASE, AMYLASE in the last 168 hours. No results for input(s): AMMONIA in the last 168 hours. CBC: Recent Labs  Lab 05/13/19 1706 05/14/19 0306 05/15/19 0403  WBC 5.8 4.8 4.6  NEUTROABS 4.6  --   --   HGB 11.4* 10.7* 10.5*  HCT 38.4* 35.0* 34.9*  MCV 93.2 93.1 92.6  PLT 150 131* 139*   Cardiac Enzymes: No results for input(s): CKTOTAL, CKMB, CKMBINDEX, TROPONINI in the last 168 hours. BNP: BNP (last 3 results) Recent Labs    10/02/18 0429 11/19/18 1103 05/13/19 1706  BNP 927.7* 456.5* 754.0*    ProBNP (last 3 results) No results for input(s): PROBNP in the last 8760 hours.  CBG: Recent Labs  Lab 05/14/19 0813 05/15/19 0738 05/16/19 0739 05/17/19 0507  GLUCAP 85 93 85 98       Signed:  [facility] Pager: [phone] 05/17/2019, 9:56 AM

## 2019-05-17 NOTE — Telephone Encounter (Signed)
Transition Care Management Follow-up Telephone Call  Date of discharge and from where: 05/17/2019, [name]  How have you been since you were released from the hospital? Patient states that he is feeling fine. No complaints at this time.   Any questions or concerns? No   Items Reviewed:  Did the pt receive and understand the discharge instructions provided? Yes   Medications obtained and verified? Yes   Any new allergies since your discharge? No  Dietary orders reviewed? Yes  Do you have support at home? Yes   Functional Questionnaire: (I = Independent and D = Dependent) ADLs: I  Bathing/Dressing- I  Meal Prep- I  Eating- I  Maintaining continence- I  Transferring/Ambulation- I  Managing Meds- I  Follow up appointments reviewed:   PCP Hospital f/u appt confirmed? Yes  Scheduled to see Dr. Einar Pheasant on 05/31/19 @ 2 pm virtually. Dr. Silvio Pate is out of the office.  [facility] f/u appt confirmed? Yes  Scheduled to see cardiology   Are transportation arrangements needed? No   If their condition worsens, is the pt aware to call PCP or go to the Emergency Dept.? Yes  Was the patient provided with contact information for the PCP's office or ED? Yes  Was to pt encouraged to call back with questions or concerns? Yes

## 2019-05-18 ENCOUNTER — Telehealth: Payer: Self-pay | Admitting: Emergency Medicine

## 2019-05-18 NOTE — Telephone Encounter (Signed)
Spoke with patient and changed appointment to in person visit and patient did not have a problem with that

## 2019-05-23 ENCOUNTER — Encounter (HOSPITAL_COMMUNITY): Payer: Self-pay | Admitting: Nurse Practitioner

## 2019-05-23 ENCOUNTER — Ambulatory Visit (HOSPITAL_COMMUNITY)
Admission: RE | Admit: 2019-05-23 | Discharge: 2019-05-23 | Disposition: A | Discharge status: Home / Self Care | Payer: Managed Care, Other (non HMO) | Source: Ambulatory Visit | Attending: Nurse Practitioner | Admitting: Nurse Practitioner

## 2019-05-23 ENCOUNTER — Telehealth: Payer: Self-pay

## 2019-05-23 ENCOUNTER — Other Ambulatory Visit: Payer: Self-pay

## 2019-05-23 VITALS — BP 128/60 | HR 65 | Ht 69.0 in | Wt 378.0 lb

## 2019-05-23 DIAGNOSIS — I110 Hypertensive heart disease with heart failure: Secondary | ICD-10-CM | POA: Diagnosis not present

## 2019-05-23 DIAGNOSIS — Z7901 Long term (current) use of anticoagulants: Secondary | ICD-10-CM | POA: Insufficient documentation

## 2019-05-23 DIAGNOSIS — Z8249 Family history of ischemic heart disease and other diseases of the circulatory system: Secondary | ICD-10-CM | POA: Insufficient documentation

## 2019-05-23 DIAGNOSIS — Z79899 Other long term (current) drug therapy: Secondary | ICD-10-CM | POA: Insufficient documentation

## 2019-05-23 DIAGNOSIS — Z800 Family history of malignant neoplasm of digestive organs: Secondary | ICD-10-CM | POA: Diagnosis not present

## 2019-05-23 DIAGNOSIS — Z86711 Personal history of pulmonary embolism: Secondary | ICD-10-CM | POA: Insufficient documentation

## 2019-05-23 DIAGNOSIS — Z7951 Long term (current) use of inhaled steroids: Secondary | ICD-10-CM | POA: Diagnosis not present

## 2019-05-23 DIAGNOSIS — D6869 Other thrombophilia: Secondary | ICD-10-CM | POA: Diagnosis not present

## 2019-05-23 DIAGNOSIS — I4819 Other persistent atrial fibrillation: Secondary | ICD-10-CM | POA: Insufficient documentation

## 2019-05-23 DIAGNOSIS — Z6841 Body Mass Index (BMI) 40.0 and over, adult: Secondary | ICD-10-CM | POA: Diagnosis not present

## 2019-05-23 DIAGNOSIS — M109 Gout, unspecified: Secondary | ICD-10-CM | POA: Insufficient documentation

## 2019-05-23 DIAGNOSIS — Z87891 Personal history of nicotine dependence: Secondary | ICD-10-CM | POA: Insufficient documentation

## 2019-05-23 DIAGNOSIS — Z833 Family history of diabetes mellitus: Secondary | ICD-10-CM | POA: Insufficient documentation

## 2019-05-23 DIAGNOSIS — I509 Heart failure, unspecified: Secondary | ICD-10-CM | POA: Insufficient documentation

## 2019-05-23 DIAGNOSIS — Z86718 Personal history of other venous thrombosis and embolism: Secondary | ICD-10-CM | POA: Diagnosis not present

## 2019-05-23 DIAGNOSIS — J449 Chronic obstructive pulmonary disease, unspecified: Secondary | ICD-10-CM | POA: Insufficient documentation

## 2019-05-23 DIAGNOSIS — G4733 Obstructive sleep apnea (adult) (pediatric): Secondary | ICD-10-CM | POA: Insufficient documentation

## 2019-05-23 LAB — BASIC METABOLIC PANEL
Anion gap: 10 (ref 5–15)
BUN: 30 mg/dL — ABNORMAL HIGH (ref 6–20)
CO2: 29 mmol/L (ref 22–32)
Calcium: 9.3 mg/dL (ref 8.9–10.3)
Chloride: 105 mmol/L (ref 98–111)
Creatinine, Ser: 2.72 mg/dL — ABNORMAL HIGH (ref 0.61–1.24)
GFR calc Af Amer: 29 mL/min — ABNORMAL LOW (ref >60)
GFR calc non Af Amer: 25 mL/min — ABNORMAL LOW (ref >60)
Glucose, Bld: 99 mg/dL (ref 70–99)
Potassium: 4.4 mmol/L (ref 3.5–5.1)
Sodium: 144 mmol/L (ref 135–145)

## 2019-05-23 MED ORDER — RIVAROXABAN 20 MG PO TABS: 20.0000 mg | ORAL_TABLET | Freq: Every day | ORAL | 3 refills | Status: DC

## 2019-05-23 NOTE — Addendum Note (Signed)
Encounter addended by: Sherran Needs, NP on: 05/23/2019 2:28 PM  Actions taken: Clinical Note Signed

## 2019-05-23 NOTE — Addendum Note (Signed)
Encounter addended by: Juluis Mire, RN on: 05/23/2019 3:13 PM  Actions taken: Diagnosis association updated, Medication long-term status modified, Order list changed

## 2019-05-23 NOTE — Telephone Encounter (Signed)
[facility] Night - Client Nonclinical Telephone Record AccessNurse Client [facility] Night - Client Client Site [place] Physician Waunita Schooner- MD Contact Type Call Who Is Calling Patient / Member / Family / Caregiver Caller Name Eyoel Throgmorton Phone Number [phone] Patient Name Arch Methot Patient DOB [birth year] Call Type Message Only Information Provided Reason for Call Request for General Office Information Initial Comment Caller states he has an appointment today but doesn't remember what time. Additional Comment Disp. Time Disposition Final User 05/23/2019 7:52:22 AM General Information Provided Yes Phillips Hay Call Closed By: Phillips Hay Transaction Date/Time: 05/23/2019 7:49:59 AM (ET)
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
## 2019-05-23 NOTE — Telephone Encounter (Signed)
I spoke with Phillip [name] and Phillip [name] has appt 05/23/19 at 10:30 with Roderic Palau NP at [facility] clinic in [place]. Phillip [name] voiced understanding and appreciative; nothing further needed.

## 2019-05-23 NOTE — Addendum Note (Signed)
Encounter addended by: Sherran Needs, NP on: 05/23/2019 2:00 PM  Actions taken: Clinical Note Signed

## 2019-05-24 LAB — FLECAINIDE LEVEL: Flecainide: 0.81 ug/mL (ref 0.20–1.00)

## 2019-05-26 ENCOUNTER — Other Ambulatory Visit: Payer: Self-pay | Admitting: Internal Medicine

## 2019-05-26 DIAGNOSIS — J4530 Mild persistent asthma, uncomplicated: Secondary | ICD-10-CM

## 2019-05-31 ENCOUNTER — Encounter: Payer: Self-pay | Admitting: Family Medicine

## 2019-05-31 ENCOUNTER — Other Ambulatory Visit: Payer: Self-pay

## 2019-05-31 ENCOUNTER — Ambulatory Visit: Payer: Managed Care, Other (non HMO) | Admitting: Family Medicine

## 2019-05-31 VITALS — BP 147/53 | HR 65 | Temp 98.3°F | Resp 12 | Wt 378.5 lb

## 2019-05-31 DIAGNOSIS — I480 Paroxysmal atrial fibrillation: Secondary | ICD-10-CM | POA: Diagnosis not present

## 2019-05-31 DIAGNOSIS — I5032 Chronic diastolic (congestive) heart failure: Secondary | ICD-10-CM

## 2019-05-31 DIAGNOSIS — E785 Hyperlipidemia, unspecified: Secondary | ICD-10-CM | POA: Diagnosis not present

## 2019-05-31 DIAGNOSIS — I10 Essential (primary) hypertension: Secondary | ICD-10-CM

## 2019-05-31 NOTE — Assessment & Plan Note (Signed)
Reviewed ASCVD risk and benefit from lipitor. Started in the hospital and tolerating well. Encouraged continuation and pt will continue to work on weight loss.

## 2019-05-31 NOTE — Patient Instructions (Signed)
I'm glad you are doing better!  Keep working on weight loss  [email or web address]  -- likely will be group 3  [facility] -- find the website

## 2019-05-31 NOTE — Progress Notes (Signed)
Subjective:     Phillip [name] is a 59 y.o. male presenting for Hospitalization Follow-up     HPI   #CHF  - doing better - breathing is improved - sleeping on 1 pillow at night - no PND - ambulating without difficulty - has been watching his salt intake - weight is down to 372 - avoiding sugary drinks - went back to work yesterday - had more swelling in seated position - elevates leg as sleeping and swelling improved - taking torsemide 60 mg BID  #afib - sinus rhythm   Seeing cardiology again in May Stress test in April  #HTN - missed a dose of medication today - normally 120/70   Review of Systems  2/19-2/23/2021: Hospitalization: CHF exacerbation - improved on IV lasix, started lipitor, sinus rhythm after cardioversions (xarelto and flecainide) 05/23/2019: Cardiology - doing well, fluid status stable. Returned xarelto to 20 mg dose. And planning for ETT  Social History   Tobacco Use  Smoking Status Former Smoker  . Packs/day: 0.25  . Years: 1.00  . Pack years: 0.25  . Types: Cigarettes  . Quit date: 07/23/1989  . Years since quitting: 29.8  Smokeless Tobacco Never Used        Objective:    BP Readings from Last 3 Encounters:  05/31/19 (!) 147/53  05/23/19 128/60  05/16/19 (!) 164/75   Wt Readings from Last 3 Encounters:  05/31/19 (!) 378 lb 8 oz (171.7 kg)  05/23/19 (!) 378 lb (171.5 kg)  05/17/19 (!) 387 lb 9.1 oz (175.8 kg)    BP (!) 147/53   Pulse 65   Temp 98.3 F (36.8 C)   Resp 12   Wt (!) 378 lb 8 oz (171.7 kg)   SpO2 95%   BMI 55.89 kg/m    Physical Exam Constitutional:      Appearance: Normal appearance. He is obese. He is not ill-appearing or diaphoretic.  HENT:     Right Ear: External ear normal.     Left Ear: External ear normal.     Nose: Nose normal.  Eyes:     General: No scleral icterus.    Extraocular Movements: Extraocular movements intact.     Conjunctiva/sclera: Conjunctivae normal.  Cardiovascular:     Rate  and Rhythm: Normal rate and regular rhythm.     Heart sounds: Heart sounds are distant.  Pulmonary:     Effort: Pulmonary effort is normal. No respiratory distress.     Breath sounds: Normal breath sounds.  Musculoskeletal:     Cervical back: Neck supple.     Comments: Trace b/l LE edema  Skin:    General: Skin is warm and dry.  Neurological:     Mental Status: He is alert. Mental status is at baseline.  Psychiatric:        Mood and Affect: Mood normal.      The 10-year ASCVD risk score Mikey Bussing DC Jr., et al., 2013) is: 14.1%   Values used to calculate the score:     Age: 84 years     Sex: Male     Is Non-Hispanic African American: Yes     Diabetic: No     Tobacco smoker: No     Systolic Blood Pressure: 297 mmHg     Is BP treated: Yes     HDL Cholesterol: 44 mg/dL     Total Cholesterol: 152 mg/dL      Assessment & Plan:   Problem List Items Addressed This Visit  Cardiovascular and Mediastinum   Essential hypertension - Primary (Chronic)    BP elevated, but has not taken medication. Was normal at cardiologist office. Cont current meds. No Changes      Paroxysmal atrial fibrillation (HCC) (Chronic)    Continues blood thinner. Rate normal today.       Chronic diastolic heart failure (HCC) (Chronic)    Reviewed hospital stay and recent Cardiology visit. Appears euvolemic today. Working on weight loss. No symptoms and taking medications as prescribed. Has cardiology follow-up and knows to call if weight gain.         Other   Hyperlipidemia LDL goal <70    Reviewed ASCVD risk and benefit from lipitor. Started in the hospital and tolerating well. Encouraged continuation and pt will continue to work on weight loss.           Return in about 9 months (around 02/22/2020).  Lesleigh Noe, MD

## 2019-05-31 NOTE — Assessment & Plan Note (Signed)
Continues blood thinner. Rate normal today.

## 2019-05-31 NOTE — Assessment & Plan Note (Signed)
BP elevated, but has not taken medication. Was normal at cardiologist office. Cont current meds. No Changes

## 2019-05-31 NOTE — Assessment & Plan Note (Addendum)
Reviewed hospital stay and recent Cardiology visit. Appears euvolemic today. Working on weight loss. No symptoms and taking medications as prescribed. Has cardiology follow-up and knows to call if weight gain.

## 2019-06-15 ENCOUNTER — Other Ambulatory Visit: Payer: Self-pay | Admitting: Internal Medicine

## 2019-06-26 ENCOUNTER — Other Ambulatory Visit: Payer: Self-pay | Admitting: Internal Medicine

## 2019-06-27 ENCOUNTER — Telehealth: Payer: Self-pay | Admitting: Internal Medicine

## 2019-06-27 NOTE — Telephone Encounter (Signed)
Last oV 05/23/19 Scr 2.72 on 05/23/19 ABW crcl 73

## 2019-06-27 NOTE — Telephone Encounter (Signed)
I guess since he is out of town until then, there is not much we can do for him. Will forward to Dr Silvio Pate as an Juluis Rainier.

## 2019-06-27 NOTE — Telephone Encounter (Signed)
Pt called to scheduled appointment for Friday.  Pt Stated about 1 week ago he was having a stabbing pain in right side.  Now the pain is not as bad or constance.  He also stated the white of his eyes are not has white has they were.  They are like yellowish brown. Pt is out of town and will not be back till Friday.  I spoke Rollene Fare L she stated to let pt know if he get worse to go to urgent care or er. Appointment scheduled 4/9 @ 8

## 2019-06-27 NOTE — Telephone Encounter (Signed)
Obviously I cannot see him before he is back in town. Sounds concerning for jaundice. Please check on him tomorrow and urge evaluation then if he isn't feeling right (pain could be a gallstone or who knows what)

## 2019-06-28 NOTE — Telephone Encounter (Signed)
Spoke to pt. He said he hasn't really paid much attention to the color of his eyes today. Says the pain in his right side is better. We will still plan to see him Friday.

## 2019-06-29 ENCOUNTER — Other Ambulatory Visit: Payer: Self-pay | Admitting: Internal Medicine

## 2019-07-01 ENCOUNTER — Ambulatory Visit: Payer: Managed Care, Other (non HMO) | Admitting: Internal Medicine

## 2019-07-01 ENCOUNTER — Encounter: Payer: Self-pay | Admitting: Internal Medicine

## 2019-07-01 ENCOUNTER — Encounter: Payer: Self-pay | Admitting: Gastroenterology

## 2019-07-01 ENCOUNTER — Other Ambulatory Visit (HOSPITAL_COMMUNITY)
Admission: RE | Admit: 2019-07-01 | Discharge: 2019-07-01 | Disposition: A | Discharge status: Home / Self Care | Payer: Managed Care, Other (non HMO) | Source: Ambulatory Visit | Attending: Nurse Practitioner | Admitting: Nurse Practitioner

## 2019-07-01 ENCOUNTER — Other Ambulatory Visit: Payer: Self-pay

## 2019-07-01 VITALS — BP 150/58 | HR 62 | Temp 97.6°F | Resp 20 | Ht 69.0 in | Wt 371.8 lb

## 2019-07-01 DIAGNOSIS — N184 Chronic kidney disease, stage 4 (severe): Secondary | ICD-10-CM

## 2019-07-01 DIAGNOSIS — R109 Unspecified abdominal pain: Secondary | ICD-10-CM | POA: Insufficient documentation

## 2019-07-01 DIAGNOSIS — Z20822 Contact with and (suspected) exposure to covid-19: Secondary | ICD-10-CM | POA: Diagnosis not present

## 2019-07-01 DIAGNOSIS — H1589 Other disorders of sclera: Secondary | ICD-10-CM

## 2019-07-01 DIAGNOSIS — I5032 Chronic diastolic (congestive) heart failure: Secondary | ICD-10-CM

## 2019-07-01 DIAGNOSIS — Z01812 Encounter for preprocedural laboratory examination: Secondary | ICD-10-CM | POA: Insufficient documentation

## 2019-07-01 DIAGNOSIS — Z1211 Encounter for screening for malignant neoplasm of colon: Secondary | ICD-10-CM

## 2019-07-01 LAB — RENAL FUNCTION PANEL
Albumin: 4 g/dL (ref 3.5–5.2)
BUN: 31 mg/dL — ABNORMAL HIGH (ref 6–23)
CO2: 31 mEq/L (ref 19–32)
Calcium: 9.2 mg/dL (ref 8.4–10.5)
Chloride: 104 mEq/L (ref 96–112)
Creatinine, Ser: 2.53 mg/dL — ABNORMAL HIGH (ref 0.40–1.50)
GFR: 31.94 mL/min — ABNORMAL LOW (ref >60.00)
Glucose, Bld: 97 mg/dL (ref 70–99)
Phosphorus: 3.1 mg/dL (ref 2.3–4.6)
Potassium: 4.4 mEq/L (ref 3.5–5.1)
Sodium: 142 mEq/L (ref 135–145)

## 2019-07-01 LAB — HEPATIC FUNCTION PANEL
ALT: 13 U/L (ref 0–53)
AST: 19 U/L (ref 0–37)
Albumin: 4 g/dL (ref 3.5–5.2)
Alkaline Phosphatase: 91 U/L (ref 39–117)
Bilirubin, Direct: 0.2 mg/dL (ref 0.0–0.3)
Total Bilirubin: 0.7 mg/dL (ref 0.2–1.2)
Total Protein: 6.3 g/dL (ref 6.0–8.3)

## 2019-07-01 LAB — SARS CORONAVIRUS 2 (TAT 6-24 HRS): SARS Coronavirus 2: NEGATIVE

## 2019-07-01 NOTE — Assessment & Plan Note (Signed)
Seems to be better Weight is down with the diuretics Also on carvedilol, hydralazine

## 2019-07-01 NOTE — Progress Notes (Signed)
Subjective:    Patient ID: Phillip [name], male    DOB: January 12, 1961, 59 y.o.   MRN: [id number]  HPI Here for concerns for pain in back---?kidney area This visit occurred during the SARS-CoV-2 public health emergency.  Safety protocols were in place, including screening questions prior to the visit, additional usage of staff PPE, and extensive cleaning of exam room while observing appropriate contact time as indicated for disinfecting solutions.   Also mom was concerned about his pupils looking less white - Noticed the right back/flank pain about 2 weeks ago Seemed better with increased fluids and cranberry juice No dysuria or urgency Pain did seem to be worse with certain movements Felt it bad this morning---tends to be paroxysmal  Does have follow up with Dr Candiss Norse for nephrology later this month Known CKD   Current Outpatient Medications on File Prior to Visit  Medication Sig Dispense Refill  . albuterol (PROAIR HFA) 108 (90 Base) MCG/ACT inhaler Inhale 2 puffs into the lungs every 4 (four) hours as needed for wheezing or shortness of breath. 1 Inhaler 3  . albuterol (PROVENTIL) (2.5 MG/3ML) 0.083% nebulizer solution INHALE 1 VIAL VIA NEBULIZER EVERY 6 HOURS AS NEEDED FOR WHEEZING/SHORTNESS OF BREATH. (Patient taking differently: Take 2.5 mg by nebulization every 6 (six) hours as needed for wheezing or shortness of breath. ) 375 mL 11  . allopurinol (ZYLOPRIM) 300 MG tablet Take 300 mg by mouth daily.    [name] atorvastatin (LIPITOR) 10 MG tablet TAKE 1 TABLET (10 MG TOTAL) BY MOUTH DAILY AT 6 PM. 30 tablet 11  . budesonide-formoterol (SYMBICORT) 80-4.5 MCG/ACT inhaler Inhale 2 puffs into the lungs 2 (two) times daily.    . carvedilol (COREG) 6.25 MG tablet Take 6.25 mg by mouth 2 (two) times daily with a meal.    . cloNIDine (CATAPRES) 0.1 MG tablet TAKE 1 TABLET BY MOUTH THREE TIMES A DAY 90 tablet 3  . famotidine (PEPCID) 20 MG tablet TAKE 1 TABLET BY MOUTH EVERYDAY AT BEDTIME 30 tablet 4  .  flecainide (TAMBOCOR) 100 MG tablet Take 1 tablet (100 mg total) by mouth 2 (two) times daily. 60 tablet 3  . hydrALAZINE (APRESOLINE) 100 MG tablet Take 1 tablet (100 mg total) by mouth 3 (three) times daily. 270 tablet 3  . montelukast (SINGULAIR) 10 MG tablet Take 10 mg by mouth at bedtime.    [name] oxymetazoline (AFRIN NASAL SPRAY) 0.05 % nasal spray Place 1 spray into both nostrils 2 (two) times daily as needed for congestion.    . pantoprazole (PROTONIX) 40 MG tablet Take 40 mg by mouth 2 (two) times daily before a meal.    . potassium chloride SA (KLOR-CON M20) 20 MEQ tablet Take 2 tablets (40 mEq total) by mouth every morning AND 1 tablet (20 mEq total) every evening. 270 tablet 3  . torsemide (DEMADEX) 20 MG tablet Take 3 tablets (60 mg total) by mouth 2 (two) times daily. Take extra 1 tablet for weight gain (Patient taking differently: Take 60 mg by mouth 2 (two) times daily. ) 200 tablet 3  . UNABLE TO FIND Med Name: CPAP with sleep    . XARELTO 20 MG TABS tablet TAKE 1 TABLET DAILY WITH SUPPER 90 tablet 1   No current facility-administered medications on file prior to visit.    Allergies  Allergen Reactions  . Other Anaphylaxis    mushrooms    Past Medical History:  Diagnosis Date  . CHF (congestive heart failure) ([place]) 11/15  EF 40-45%, improved with follow-up  . COPD (chronic obstructive pulmonary disease) ([place])   . Gout   . History of DVT (deep vein thrombosis)   . Hypertension   . Hypertensive cardiovascular disease   . Kidney dysfunction   . Morbid obesity ([place])   . OSA on CPAP   . Paroxysmal atrial fibrillation ([facility])   . PNA (pneumonia)   . Pulmonary embolism ([place]) 2010   left leg DVT    Past Surgical History:  Procedure Laterality Date  . A-FLUTTER ABLATION N/A 07/22/2017   Procedure: A-FLUTTER ABLATION;  Surgeon: Thompson Grayer, MD;  Location: [facility];  Service: Cardiovascular;  Laterality: N/A;  . CARDIOVERSION N/A 10/14/2018   Procedure:  CARDIOVERSION;  Surgeon: Jolaine Artist, MD;  Location: [facility] ENDOSCOPY;  Service: Cardiovascular;  Laterality: N/A;  . CARDIOVERSION N/A 04/22/2019   Procedure: CARDIOVERSION;  Surgeon: Thayer Headings, MD;  Location: [facility] ENDOSCOPY;  Service: Cardiovascular;  Laterality: N/A;  . CARDIOVERSION N/A 05/10/2019   Procedure: CARDIOVERSION;  Surgeon: Jolaine Artist, MD;  Location: [facility] ENDOSCOPY;  Service: Cardiovascular;  Laterality: N/A;  . UMBILICAL HERNIA REPAIR  1992    Family History  Problem Relation Age of Onset  . Hypertension Mother   . Diabetes Father   . CAD Father   . Stomach cancer Father   . Clotting disorder Father   . Heart disease Father        transplant  . Allergies Brother     Social History   Socioeconomic History  . Marital status: Divorced    Spouse name: Not on file  . Number of children: 2  . Years of education: 25  . Highest education level: Not on file  Occupational History  . Occupation: Drives dump truck    Comment:    . Occupation: Airline pilot    Comment: Disabled  Tobacco Use  . Smoking status: Former Smoker    Packs/day: 0.25    Years: 1.00    Pack years: 0.25    Types: Cigarettes    Quit date: 07/23/1989    Years since quitting: 29.9  . Smokeless tobacco: Never Used  Substance and Sexual Activity  . Alcohol use: No    Alcohol/week: 0.0 standard drinks  . Drug use: No  . Sexual activity: Not Currently  Other Topics Concern  . Not on file  Social History Narrative   Pt lives in [place], alone.   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   Has 2 sons in [place]      No living will   Would want sons to make decisions for him   Would accept resuscitation   Social Determinants of Health   Financial Resource Strain:   . Difficulty of Paying Living Expenses:   Food Insecurity:   . Worried About Charity fundraiser in the Last Year:   . Arboriculturist in the Last Year:   Transportation Needs:   .  Film/video editor (Medical):   [name] Lack of Transportation (Non-Medical):   Physical Activity:   . Days of Exercise per Week:   . Minutes of Exercise per Session:   Stress:   . Feeling of Stress :   Social Connections:   . Frequency of Communication with Friends and Family:   . Frequency of Social Gatherings with Friends and Family:   . Attends Religious Services:   . Active Member of Clubs or Organizations:   .  Attends Archivist Meetings:   [name] Marital Status:   Intimate Partner Violence:   . Fear of Current or Ex-Partner:   . Emotionally Abused:   [name] Physically Abused:   . Sexually Abused:    Review of Systems Still driving truck---did drive a friend's truck to [place] this week (then the pain was worse). But his seat is more comfortable No history of kidney stones or hematuria    Objective:   Physical Exam  Constitutional: No distress.  Neck: No thyromegaly present.  Cardiovascular: Normal rate, regular rhythm and normal heart sounds. Exam reveals no gallop.  No murmur heard. Respiratory: Effort normal and breath sounds normal. No respiratory distress. He has no wheezes. He has no rales.  GI:  Some point tenderness over lower right posterior ribs No spine tenderness SLR negative  Musculoskeletal:        General: No edema.  Lymphadenopathy:    He has no cervical adenopathy.           Assessment & Plan:

## 2019-07-01 NOTE — Assessment & Plan Note (Signed)
Very slight and focal Reassured--doesn't seem to be jaundice Will check labs though

## 2019-07-01 NOTE — Assessment & Plan Note (Signed)
Will recheck labs today He was concerned that this was his kidney--but it doesn't seem to be

## 2019-07-01 NOTE — Assessment & Plan Note (Signed)
Reassured ---seems like soft tissue Discussed heat

## 2019-07-04 ENCOUNTER — Other Ambulatory Visit (HOSPITAL_COMMUNITY): Payer: Self-pay | Admitting: Nurse Practitioner

## 2019-07-04 DIAGNOSIS — Z79899 Other long term (current) drug therapy: Secondary | ICD-10-CM

## 2019-07-04 DIAGNOSIS — Z5181 Encounter for therapeutic drug level monitoring: Secondary | ICD-10-CM

## 2019-07-04 DIAGNOSIS — I4819 Other persistent atrial fibrillation: Secondary | ICD-10-CM

## 2019-07-05 ENCOUNTER — Other Ambulatory Visit: Payer: Self-pay

## 2019-07-05 ENCOUNTER — Other Ambulatory Visit: Payer: Self-pay | Admitting: *Deleted

## 2019-07-05 ENCOUNTER — Encounter: Payer: Self-pay | Admitting: *Deleted

## 2019-07-05 ENCOUNTER — Ambulatory Visit (INDEPENDENT_AMBULATORY_CARE_PROVIDER_SITE_OTHER): Payer: Managed Care, Other (non HMO)

## 2019-07-05 DIAGNOSIS — Z79899 Other long term (current) drug therapy: Secondary | ICD-10-CM | POA: Diagnosis not present

## 2019-07-05 DIAGNOSIS — Z5181 Encounter for therapeutic drug level monitoring: Secondary | ICD-10-CM

## 2019-07-05 DIAGNOSIS — I4819 Other persistent atrial fibrillation: Secondary | ICD-10-CM | POA: Diagnosis not present

## 2019-07-05 LAB — EXERCISE TOLERANCE TEST
Estimated workload: 1 METS
Exercise duration (min): 1 min
Exercise duration (sec): 27 s
MPHR: 164 {beats}/min
Peak HR: 99 {beats}/min
Percent HR: 60 %
RPE: 15
Rest HR: 55 {beats}/min

## 2019-07-05 NOTE — Progress Notes (Signed)
Patient ID: Phillip [name], male   DOB: Jun 13, 1960, 59 y.o.   MRN: [id number] Patient had treadmill to monitor Flecainide Tx.   Patient had significant QRS widening within first minute of exercise.  Test reviewed with Dr. Marlou Porch.  Patient instructed to discontinue Flecainide immediately.

## 2019-07-06 ENCOUNTER — Other Ambulatory Visit: Payer: Self-pay | Admitting: Internal Medicine

## 2019-07-25 ENCOUNTER — Other Ambulatory Visit: Payer: Self-pay | Admitting: Cardiology

## 2019-08-01 ENCOUNTER — Other Ambulatory Visit: Payer: Self-pay

## 2019-08-01 ENCOUNTER — Ambulatory Visit (HOSPITAL_COMMUNITY)
Admission: RE | Admit: 2019-08-01 | Discharge: 2019-08-01 | Disposition: A | Discharge status: Home / Self Care | Payer: Managed Care, Other (non HMO) | Source: Ambulatory Visit | Attending: Internal Medicine | Admitting: Internal Medicine

## 2019-08-01 VITALS — BP 142/62 | HR 60 | Wt 364.1 lb

## 2019-08-01 DIAGNOSIS — M109 Gout, unspecified: Secondary | ICD-10-CM | POA: Insufficient documentation

## 2019-08-01 DIAGNOSIS — I5042 Chronic combined systolic (congestive) and diastolic (congestive) heart failure: Secondary | ICD-10-CM | POA: Diagnosis present

## 2019-08-01 DIAGNOSIS — Z833 Family history of diabetes mellitus: Secondary | ICD-10-CM | POA: Diagnosis not present

## 2019-08-01 DIAGNOSIS — Z87891 Personal history of nicotine dependence: Secondary | ICD-10-CM | POA: Insufficient documentation

## 2019-08-01 DIAGNOSIS — I483 Typical atrial flutter: Secondary | ICD-10-CM | POA: Diagnosis not present

## 2019-08-01 DIAGNOSIS — I5022 Chronic systolic (congestive) heart failure: Secondary | ICD-10-CM | POA: Diagnosis not present

## 2019-08-01 DIAGNOSIS — Z9989 Dependence on other enabling machines and devices: Secondary | ICD-10-CM

## 2019-08-01 DIAGNOSIS — I130 Hypertensive heart and chronic kidney disease with heart failure and stage 1 through stage 4 chronic kidney disease, or unspecified chronic kidney disease: Secondary | ICD-10-CM | POA: Diagnosis not present

## 2019-08-01 DIAGNOSIS — Z7901 Long term (current) use of anticoagulants: Secondary | ICD-10-CM | POA: Diagnosis not present

## 2019-08-01 DIAGNOSIS — Z7951 Long term (current) use of inhaled steroids: Secondary | ICD-10-CM | POA: Diagnosis not present

## 2019-08-01 DIAGNOSIS — Z86711 Personal history of pulmonary embolism: Secondary | ICD-10-CM | POA: Diagnosis not present

## 2019-08-01 DIAGNOSIS — I10 Essential (primary) hypertension: Secondary | ICD-10-CM | POA: Diagnosis not present

## 2019-08-01 DIAGNOSIS — Z6841 Body Mass Index (BMI) 40.0 and over, adult: Secondary | ICD-10-CM | POA: Diagnosis not present

## 2019-08-01 DIAGNOSIS — N1832 Chronic kidney disease, stage 3b: Secondary | ICD-10-CM | POA: Insufficient documentation

## 2019-08-01 DIAGNOSIS — I480 Paroxysmal atrial fibrillation: Secondary | ICD-10-CM

## 2019-08-01 DIAGNOSIS — Z79899 Other long term (current) drug therapy: Secondary | ICD-10-CM | POA: Insufficient documentation

## 2019-08-01 DIAGNOSIS — J449 Chronic obstructive pulmonary disease, unspecified: Secondary | ICD-10-CM | POA: Insufficient documentation

## 2019-08-01 DIAGNOSIS — I5043 Acute on chronic combined systolic (congestive) and diastolic (congestive) heart failure: Secondary | ICD-10-CM | POA: Diagnosis not present

## 2019-08-01 DIAGNOSIS — Z86718 Personal history of other venous thrombosis and embolism: Secondary | ICD-10-CM | POA: Diagnosis not present

## 2019-08-01 DIAGNOSIS — G4733 Obstructive sleep apnea (adult) (pediatric): Secondary | ICD-10-CM | POA: Diagnosis not present

## 2019-08-01 DIAGNOSIS — I5032 Chronic diastolic (congestive) heart failure: Secondary | ICD-10-CM

## 2019-08-01 DIAGNOSIS — Z8249 Family history of ischemic heart disease and other diseases of the circulatory system: Secondary | ICD-10-CM | POA: Insufficient documentation

## 2019-08-01 DIAGNOSIS — Z800 Family history of malignant neoplasm of digestive organs: Secondary | ICD-10-CM | POA: Diagnosis not present

## 2019-08-01 NOTE — Addendum Note (Signed)
Encounter addended by: Scarlette Calico, RN on: 08/01/2019 10:41 AM  Actions taken: Visit diagnoses modified, Order list changed, Diagnosis association updated, Clinical Note Signed

## 2019-08-01 NOTE — Patient Instructions (Signed)
Your physician has requested that you have an echocardiogram. Echocardiography is a painless test that uses sound waves to create images of your heart. It provides your doctor with information about the size and shape of your heart and how well your heart's chambers and valves are working. This procedure takes approximately one hour. There are no restrictions for this procedure.  Please call our office in January to schedule your follow up appointment  If you have any questions or concerns before your next appointment please send [place] a message through [place] or call our office at [phone].  At the [facility], you and your health needs are our priority. As part of our continuing mission to provide you with exceptional heart care, we have created designated Provider Care Teams. These Care Teams include your primary Cardiologist (physician) and Advanced Practice Providers (APPs- Physician Assistants and Nurse Practitioners) who all work together to provide you with the care you need, when you need it.   You may see any of the following providers on your designated Care Team at your next follow up: [name] Dr Glori Bickers . Dr Loralie Champagne . Darrick Grinder, NP . Lyda Jester, PA . Audry Riles, PharmD   Please be sure to bring in all your medications bottles to every appointment.

## 2019-08-01 NOTE — Progress Notes (Signed)
Advanced Heart Failure Clinic Note   Referring Physician: [name] Primary Care: Silvio Pate Primary Cardiologist: [name]  EP: Dr Rayann Heman HF MD: Dr Haroldine Laws   HPI: Phillip [name] is a 59 y.o. male hx of PAF, remote PE, hx of DVT, OSA, morbid obesity, CKD Stage III,  HTN, and  combined chronic systolic/diastolic. S/P A Flutter ablation May 2019 .    EF in 2015 40-45% Echo 12/21/16 with EF 55-60% G2DD, mild aortic regurgitation. Last nuc 2008 no ischemia, no hx of cath due to CKD.   Developed AFL and underwent AFL ablation in 5/19.  I saw him last on 10/11/18 and was feeling bad. More SOB. Having some pressure in his chest and arm. + LE edema. Found to be in AFL/AF in 80s. Echo at that time with EF back down to 30%. Underwent DC-CV on 10/14/18 and referred to AF Clinic.   Developed recurrent AF at the end of January 2021. Underwent attempt at [facility] on 04/22/19 but failed to convert. Seen back in AF Clinic on 2/5. Nota able to use Tikosyn due to long QT. Not ablation candidate due to obesity. Considered amiodarone. Started on flecainide but  had treadmill to monitor Flecainide Tx.   Patient had significant QRS widening within first minute of exercise.  Flecainide stopped  Echo 11/20 EF 45-50%  Returns for f/u. Says he feels really good. No AF since February. Has lost about 40 pounds with diet. Gets on TM and does some dumbbell exercises. No bleeding with Xarelto.     Review of systems complete and found to be negative unless listed in HPI.    Past Medical History:  Diagnosis Date  . CHF (congestive heart failure) ([place]) 11/15   EF 40-45%, improved with follow-up  . COPD (chronic obstructive pulmonary disease) ([place])   . Gout   . History of DVT (deep vein thrombosis)   . Hypertension   . Hypertensive cardiovascular disease   . Kidney dysfunction   . Morbid obesity ([place])   . OSA on CPAP   . Paroxysmal atrial fibrillation ([facility])   . PNA (pneumonia)   . Pulmonary embolism ([place]) 2010   left leg  DVT   Current Outpatient Medications  Medication Sig Dispense Refill  . albuterol (PROAIR HFA) 108 (90 Base) MCG/ACT inhaler Inhale 2 puffs into the lungs every 4 (four) hours as needed for wheezing or shortness of breath. 1 Inhaler 3  . albuterol (PROVENTIL) (2.5 MG/3ML) 0.083% nebulizer solution INHALE 1 VIAL VIA NEBULIZER EVERY 6 HOURS AS NEEDED FOR WHEEZING/SHORTNESS OF BREATH. (Patient taking differently: Take 2.5 mg by nebulization every 6 (six) hours as needed for wheezing or shortness of breath. ) 375 mL 11  . allopurinol (ZYLOPRIM) 300 MG tablet Take 300 mg by mouth daily.    [name] atorvastatin (LIPITOR) 10 MG tablet TAKE 1 TABLET (10 MG TOTAL) BY MOUTH DAILY AT 6 PM. 30 tablet 11  . carvedilol (COREG) 6.25 MG tablet TAKE 1 TABLET TWICE A DAY WITH MEALS 180 tablet 3  . cloNIDine (CATAPRES) 0.1 MG tablet TAKE 1 TABLET BY MOUTH THREE TIMES A DAY 90 tablet 3  . famotidine (PEPCID) 20 MG tablet TAKE 1 TABLET BY MOUTH EVERYDAY AT BEDTIME 30 tablet 4  . hydrALAZINE (APRESOLINE) 100 MG tablet Take 1 tablet (100 mg total) by mouth 3 (three) times daily. 270 tablet 3  . montelukast (SINGULAIR) 10 MG tablet Take 10 mg by mouth at bedtime.    [name] oxymetazoline (AFRIN NASAL SPRAY) 0.05 % nasal spray  Place 1 spray into both nostrils 2 (two) times daily as needed for congestion.    . pantoprazole (PROTONIX) 40 MG tablet Take 40 mg by mouth 2 (two) times daily before a meal.    . potassium chloride SA (KLOR-CON M20) 20 MEQ tablet Take 2 tablets (40 mEq total) by mouth every morning AND 1 tablet (20 mEq total) every evening. 270 tablet 3  . SYMBICORT 80-4.5 MCG/ACT inhaler USE 2 INHALATIONS TWICE A DAY (NEED APPOINTMENT) 30.6 g 3  . torsemide (DEMADEX) 20 MG tablet Take 3 tablets (60 mg total) by mouth 2 (two) times daily. Take extra 1 tablet for weight gain 200 tablet 3  . UNABLE TO FIND Med Name: CPAP with sleep    . XARELTO 20 MG TABS tablet TAKE 1 TABLET DAILY WITH SUPPER 90 tablet 1   No current  facility-administered medications for this encounter.   Allergies  Allergen Reactions  . Other Anaphylaxis    mushrooms    Social History   Socioeconomic History  . Marital status: Divorced    Spouse name: Not on file  . Number of children: 2  . Years of education: 30  . Highest education level: Not on file  Occupational History  . Occupation: Drives dump truck    Comment:    . Occupation: Airline pilot    Comment: Disabled  Tobacco Use  . Smoking status: Former Smoker    Packs/day: 0.25    Years: 1.00    Pack years: 0.25    Types: Cigarettes    Quit date: 07/23/1989    Years since quitting: 30.0  . Smokeless tobacco: Never Used  Substance and Sexual Activity  . Alcohol use: No    Alcohol/week: 0.0 standard drinks  . Drug use: No  . Sexual activity: Not Currently  Other Topics Concern  . Not on file  Social History Narrative   Pt lives in [place], alone.   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   Has 2 sons in [place]      No living will   Would want sons to make decisions for him   Would accept resuscitation   Social Determinants of Health   Financial Resource Strain:   . Difficulty of Paying Living Expenses:   Food Insecurity:   . Worried About Charity fundraiser in the Last Year:   . Arboriculturist in the Last Year:   Transportation Needs:   . Film/video editor (Medical):   [name] Lack of Transportation (Non-Medical):   Physical Activity:   . Days of Exercise per Week:   . Minutes of Exercise per Session:   Stress:   . Feeling of Stress :   Social Connections:   . Frequency of Communication with Friends and Family:   . Frequency of Social Gatherings with Friends and Family:   . Attends Religious Services:   . Active Member of Clubs or Organizations:   . Attends Archivist Meetings:   [name] Marital Status:   Intimate Partner Violence:   . Fear of Current or Ex-Partner:   . Emotionally Abused:   [name] Physically  Abused:   . Sexually Abused:     Family History  Problem Relation Age of Onset  . Hypertension Mother   . Diabetes Father   . CAD Father   . Stomach cancer Father   . Clotting disorder Father   . Heart disease Father  transplant  . Allergies Brother    Vitals:   08/01/19 1001  BP: (!) 142/62  Pulse: 60  SpO2: 97%  Weight: (!) 165.2 kg (364 lb 2 oz)   Wt Readings from Last 3 Encounters:  08/01/19 (!) 165.2 kg (364 lb 2 oz)  07/01/19 (!) 168.6 kg (371 lb 12 oz)  05/31/19 (!) 171.7 kg (378 lb 8 oz)   PHYSICAL EXAM: General:  Well appearing. No resp difficulty HEENT: normal Neck: supple. no JVD. Carotids 2+ bilat; no bruits. No lymphadenopathy or thryomegaly appreciated. Cor: PMI nondisplaced. Regular rate & rhythm. No rubs, gallops or murmurs. Lungs: clear Abdomen: markedly obese soft, nontender, nondistended. No hepatosplenomegaly. No bruits or masses. Good bowel sounds. Extremities: no cyanosis, clubbing, rash, edema Neuro: alert & orientedx3, cranial nerves grossly intact. moves all 4 extremities w/o difficulty. Affect pleasant   ECG NSR 65 LVH Mild non-specific TWI Personally reviewed   ASSESSMENT & PLAN:  1. Acute on chronic systolic/diastolic HF - Echo 09/9022 LVEF 55-60%, Grade 2 DD. - EF 7/20 back down to 30%. Suspect due to AF but rate not overly elevated. HTN may also be playing a role. - Symptoms improved after DC-CV on 10/11/18  - Echo 11/20 EF 45-50% - Doing well now that he is back in NSR - Will repeat echo  - Volume status ok - Continue torsemide 60 bid for now. - Continue hydralazine 100 tid and carvedilol 6.25 bid. - Has not had cath due to CKD and lack of clear anginal symptoms.  - No ACE/ARB with CKD (creatinine has run 2.5-2.7)  2. PAF / Typical A-flutter, paroxysmal - S/P AFL ablation 07/2017. - s/p DC-CV of AFib on 10/14/18 and 05/10/19 - no further AF - Continue Xarelto 20 mg daily.  - No bleeding - He is compliant with CPAP. Needs  further weight loss - Has been seen in AF Clinic and felt not to be candidate for RFA due to size . - Not candidate for Tikosyn with CKD. Failed flecainide with QT prolongation. Amio would be only option if needed   3. Hypertension - BP is always  high here but says it is well controlled at home with SBPs in 120-130 range.   6. Morbid obesity - Body mass index is 53.77 kg/m.  - Desperately needs weight loss  5. CKD IIIOb - Creatinine baseline ~2.5 -2.7 - Follows with Dr. Candiss Norse  6. OSA on CPAP - complaint with CPAP - continue with weight loss    Glori Bickers, MD  10:25 AM

## 2019-08-12 ENCOUNTER — Ambulatory Visit (HOSPITAL_COMMUNITY)
Admission: RE | Admit: 2019-08-12 | Discharge: 2019-08-12 | Disposition: A | Discharge status: Home / Self Care | Payer: Managed Care, Other (non HMO) | Source: Ambulatory Visit | Attending: Internal Medicine | Admitting: Internal Medicine

## 2019-08-12 ENCOUNTER — Other Ambulatory Visit: Payer: Self-pay

## 2019-08-12 DIAGNOSIS — I4892 Unspecified atrial flutter: Secondary | ICD-10-CM | POA: Insufficient documentation

## 2019-08-12 DIAGNOSIS — I5022 Chronic systolic (congestive) heart failure: Secondary | ICD-10-CM

## 2019-08-12 DIAGNOSIS — I110 Hypertensive heart disease with heart failure: Secondary | ICD-10-CM | POA: Insufficient documentation

## 2019-08-12 DIAGNOSIS — I4891 Unspecified atrial fibrillation: Secondary | ICD-10-CM | POA: Diagnosis not present

## 2019-08-12 DIAGNOSIS — I5032 Chronic diastolic (congestive) heart failure: Secondary | ICD-10-CM | POA: Diagnosis not present

## 2019-08-12 DIAGNOSIS — J449 Chronic obstructive pulmonary disease, unspecified: Secondary | ICD-10-CM | POA: Insufficient documentation

## 2019-08-12 DIAGNOSIS — I351 Nonrheumatic aortic (valve) insufficiency: Secondary | ICD-10-CM | POA: Insufficient documentation

## 2019-08-12 DIAGNOSIS — I471 Supraventricular tachycardia: Secondary | ICD-10-CM | POA: Diagnosis not present

## 2019-08-12 DIAGNOSIS — I509 Heart failure, unspecified: Secondary | ICD-10-CM | POA: Insufficient documentation

## 2019-08-12 DIAGNOSIS — E785 Hyperlipidemia, unspecified: Secondary | ICD-10-CM | POA: Insufficient documentation

## 2019-08-12 NOTE — Progress Notes (Signed)
  Echocardiogram 2D Echocardiogram has been performed.  Phillip [name] [name] 08/12/2019, 10:49 AM

## 2019-08-23 ENCOUNTER — Encounter: Payer: Managed Care, Other (non HMO) | Admitting: Gastroenterology

## 2019-08-26 ENCOUNTER — Other Ambulatory Visit: Payer: Self-pay | Admitting: Internal Medicine

## 2019-08-31 ENCOUNTER — Ambulatory Visit: Payer: Managed Care, Other (non HMO) | Admitting: Gastroenterology

## 2019-08-31 ENCOUNTER — Encounter: Payer: Self-pay | Admitting: Gastroenterology

## 2019-08-31 ENCOUNTER — Other Ambulatory Visit (INDEPENDENT_AMBULATORY_CARE_PROVIDER_SITE_OTHER): Payer: Managed Care, Other (non HMO)

## 2019-08-31 ENCOUNTER — Telehealth: Payer: Self-pay

## 2019-08-31 VITALS — BP 116/58 | HR 76 | Ht 66.75 in | Wt 355.2 lb

## 2019-08-31 DIAGNOSIS — D649 Anemia, unspecified: Secondary | ICD-10-CM | POA: Diagnosis not present

## 2019-08-31 DIAGNOSIS — Z7901 Long term (current) use of anticoagulants: Secondary | ICD-10-CM | POA: Diagnosis not present

## 2019-08-31 DIAGNOSIS — Z1211 Encounter for screening for malignant neoplasm of colon: Secondary | ICD-10-CM | POA: Diagnosis not present

## 2019-08-31 LAB — IBC + FERRITIN
Ferritin: 83.2 ng/mL (ref 22.0–322.0)
Iron: 97 ug/dL (ref 42–165)
Saturation Ratios: 33.5 % (ref 20.0–50.0)
Transferrin: 207 mg/dL — ABNORMAL LOW (ref 212.0–360.0)

## 2019-08-31 NOTE — Telephone Encounter (Signed)
[facility] Pre-operative Risk Assessment     Request for surgical clearance:     Endoscopy Procedure  What type of surgery is being performed?     Colonoscopy  When is this surgery scheduled?     TBD at [facility]  What type of clearance is required ?   Pharmacy  Are there any medications that need to be held prior to surgery and how long? Xarelto x 3 days  Practice name and name of physician performing surgery?      [facility]  What is your office phone and fax number?      Phone- [phone]  [phone]  Anesthesia type (None, local, MAC, general) ?       MAC

## 2019-08-31 NOTE — Progress Notes (Signed)
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  He is never had a prior colonoscopy.  He denies any family history of colon cancer.  He thinks his father may have had stomach cancer but he is not sure, had some type of malignancy leading to surgery around age 87s.  He has some occasional constipation at times, otherwise no problems with his bowels that bother him.  Denies any blood in his stools.  No abdominal pains.  He had a negative FOBT in November 2019.  He has had some cardiac issues in the past year with atrial fibrillation.  He states has been doing a lot better in recent months with lifestyle changes and what he is eating and drinking.  He had an echocardiogram with cardiology in May showing an EF of 45 to 50%.  He states he discussed colonoscopy with Dr. Hayden Pedro and was told he is okay to proceed with it if needed.  His blood clot was in 2010 he has been on Xarelto since that time.  He denies any problems with anesthesia.  He has been on Protonix 40 mg once a day to twice a day as needed for reflux which controls things pretty well for him.  Is been on this for the past year or so.  Review of labs he does have a chronic anemia. Hgb 10.5, MCV 92.6.  No prior iron studies that I can see.  Echo 08/12/19 - EF 45-50%   Past Medical History:  Diagnosis Date  . CHF (congestive heart failure) ([place]) 11/15   EF 40-45%, improved with follow-up  . COPD (chronic obstructive pulmonary disease) ([place])   . DVT (deep venous thrombosis) ([place])   . Gout   . Hypertension   . Hypertensive cardiovascular disease   . Kidney dysfunction   . Morbid obesity ([place])   . OSA on CPAP   . Paroxysmal atrial fibrillation ([facility])   . PNA (pneumonia)   . Pulmonary embolism ([place]) 2010   left leg DVT     Past Surgical History:  Procedure Laterality Date  . A-FLUTTER  ABLATION N/A 07/22/2017   Procedure: A-FLUTTER ABLATION;  Surgeon: Thompson Grayer, MD;  Location: [facility];  Service: Cardiovascular;  Laterality: N/A;  . CARDIOVERSION N/A 10/14/2018   Procedure: CARDIOVERSION;  Surgeon: Jolaine Artist, MD;  Location: [facility] ENDOSCOPY;  Service: Cardiovascular;  Laterality: N/A;  . CARDIOVERSION N/A 04/22/2019   Procedure: CARDIOVERSION;  Surgeon: Thayer Headings, MD;  Location: [facility] ENDOSCOPY;  Service: Cardiovascular;  Laterality: N/A;  . CARDIOVERSION N/A 05/10/2019   Procedure: CARDIOVERSION;  Surgeon: Jolaine Artist, MD;  Location: [facility] ENDOSCOPY;  Service: Cardiovascular;  Laterality: N/A;  . UMBILICAL HERNIA REPAIR  1992   Family History  Problem Relation Age of Onset  . Hypertension Mother   . Diabetes Father   . CAD Father   . Stomach cancer Father   . Clotting disorder Father   . Heart disease Father        transplant  . Allergies Brother    Social History   Tobacco Use  . Smoking status: Former Smoker    Packs/day: 0.25    Years: 1.00    Pack years: 0.25    Types: Cigarettes    Quit date: 07/23/1989    Years since quitting: 30.1  . Smokeless tobacco:  Never Used  Substance Use Topics  . Alcohol use: No    Alcohol/week: 0.0 standard drinks  . Drug use: No   Current Outpatient Medications  Medication Sig Dispense Refill  . albuterol (PROAIR HFA) 108 (90 Base) MCG/ACT inhaler Inhale 2 puffs into the lungs every 4 (four) hours as needed for wheezing or shortness of breath. 1 Inhaler 3  . albuterol (PROVENTIL) (2.5 MG/3ML) 0.083% nebulizer solution INHALE 1 VIAL VIA NEBULIZER EVERY 6 HOURS AS NEEDED FOR WHEEZING/SHORTNESS OF BREATH. (Patient taking differently: Take 2.5 mg by nebulization every 6 (six) hours as needed for wheezing or shortness of breath. ) 375 mL 11  . allopurinol (ZYLOPRIM) 300 MG tablet Take 300 mg by mouth daily.    [name] atorvastatin (LIPITOR) 10 MG tablet TAKE 1 TABLET (10 MG TOTAL) BY MOUTH DAILY AT 6 PM. 30  tablet 11  . carvedilol (COREG) 6.25 MG tablet TAKE 1 TABLET TWICE A DAY WITH MEALS 180 tablet 3  . cloNIDine (CATAPRES) 0.1 MG tablet TAKE 1 TABLET BY MOUTH THREE TIMES A DAY 90 tablet 3  . famotidine (PEPCID) 20 MG tablet TAKE 1 TABLET BY MOUTH EVERYDAY AT BEDTIME 30 tablet 4  . hydrALAZINE (APRESOLINE) 100 MG tablet Take 1 tablet (100 mg total) by mouth 3 (three) times daily. 270 tablet 3  . montelukast (SINGULAIR) 10 MG tablet TAKE 1 TABLET BY MOUTH EVERYDAY AT BEDTIME 30 tablet 0  . oxymetazoline (AFRIN NASAL SPRAY) 0.05 % nasal spray Place 1 spray into both nostrils 2 (two) times daily as needed for congestion.    . pantoprazole (PROTONIX) 40 MG tablet Take 40 mg by mouth 2 (two) times daily before a meal.    . potassium chloride SA (KLOR-CON M20) 20 MEQ tablet Take 2 tablets (40 mEq total) by mouth every morning AND 1 tablet (20 mEq total) every evening. 270 tablet 3  . SYMBICORT 80-4.5 MCG/ACT inhaler USE 2 INHALATIONS TWICE A DAY (NEED APPOINTMENT) 30.6 g 3  . torsemide (DEMADEX) 20 MG tablet Take 3 tablets (60 mg total) by mouth 2 (two) times daily. Take extra 1 tablet for weight gain 200 tablet 3  . UNABLE TO FIND Med Name: CPAP with sleep    . XARELTO 20 MG TABS tablet TAKE 1 TABLET DAILY WITH SUPPER 90 tablet 1  . colchicine 0.6 MG tablet Take 1 tablet by mouth as needed.     No current facility-administered medications for this visit.   Allergies  Allergen Reactions  . Other Anaphylaxis    mushrooms     Review of Systems: All systems reviewed and negative except where noted in HPI.    ECHOCARDIOGRAM COMPLETE  Result Date: 08/12/2019    ECHOCARDIOGRAM REPORT   Patient Name:   LUBY SEAMANS Date of Exam: 08/12/2019 Medical Rec #:  [id number]  Height:       69.0 in Accession #:    [id number] Weight:       364.1 lb Date of Birth:  1960/08/06   BSA:          2.665 m Patient Age:    24 years   BP:           142/62 mmHg Patient Gender: M          HR:           61 bpm. Exam Location:   Outpatient Procedure: 2D Echo Indications:    Congestive Heart Failure 428.0 / I50.9  History:  Patient has prior history of Echocardiogram examinations, most                 recent 01/26/2019. COPD, Arrythmias:Atrial Fibrillation, SVT and                 Atrial Flutter; Risk Factors:Hypertension and Dyslipidemia.  Sonographer:    Vikki Ports Turrentine Referring Phys: [id number] DANIEL R BENSIMHON IMPRESSIONS  1. Left ventricular ejection fraction, by estimation, is 45 to 50%. The left ventricle has mildly decreased function. The left ventricle has no regional wall motion abnormalities. The left ventricular internal cavity size was mildly to moderately dilated. There is mild concentric left ventricular hypertrophy. Left ventricular diastolic parameters are indeterminate.  2. Right ventricular systolic function is normal. The right ventricular size is normal.  3. Left atrial size was mildly dilated.  4. Right atrial size was mildly dilated.  5. The mitral valve is grossly normal. Trivial mitral valve regurgitation. No evidence of mitral stenosis.  6. There is significant turbulent flow at the aortic valve, with mild-moderate AR by PHT. Difficult to quantify visually given pattern of signal; appears to be predominantly turbulence near aortic annulus, with only small amount of color flow seen moving from LVOT into LV. In parasternal short view, most prominent color over right coronary cusp. There is no clear flow seen into RVOT, but cannot exclude possible Sinus of Valsalva aneurysm or very small rupture. The aortic valve was not well visualized. Aortic valve regurgitation is mild to moderate. FINDINGS  Left Ventricle: Left ventricular ejection fraction, by estimation, is 45 to 50%. The left ventricle has mildly decreased function. The left ventricle has no regional wall motion abnormalities. The left ventricular internal cavity size was mildly to moderately dilated. There is mild concentric left ventricular hypertrophy.  Left ventricular diastolic parameters are indeterminate. Right Ventricle: The right ventricular size is normal. No increase in right ventricular wall thickness. Right ventricular systolic function is normal. Left Atrium: Left atrial size was mildly dilated. Right Atrium: Right atrial size was mildly dilated. Pericardium: A small pericardial effusion is present. Mitral Valve: The mitral valve is grossly normal. Trivial mitral valve regurgitation. No evidence of mitral valve stenosis. Tricuspid Valve: The tricuspid valve is normal in structure. Tricuspid valve regurgitation is trivial. No evidence of tricuspid stenosis. Aortic Valve: There is significant turbulent flow at the aortic valve, with mild-moderate AR by PHT. Difficult to quantify visually given pattern of signal; appears to be predominantly turbulence near aortic annulus, with only small amount of color flow seen moving from LVOT into LV. In parasternal short view, most prominent color over right coronary cusp. There is no clear flow seen into RVOT, but cannot exclude possible Sinus of Valsalva aneurysm or very small rupture. The aortic valve was not well visualized. Aortic valve regurgitation is mild to moderate. Aortic valve mean gradient measures 8.0 mmHg. Aortic valve peak gradient measures 12.0 mmHg. Aortic valve area, by VTI measures 3.74 cm. Pulmonic Valve: The pulmonic valve was not well visualized. Pulmonic valve regurgitation is not visualized. Aorta: The aortic root, ascending aorta and aortic arch are all structurally normal, with no evidence of dilitation or obstruction. IAS/Shunts: The atrial septum is grossly normal.  LEFT VENTRICLE PLAX 2D LVIDd:         6.50 cm      Diastology LVIDs:         4.80 cm      LV e' lateral:   4.26 cm/s LV PW:  1.20 cm      LV E/e' lateral: 14.0 LV IVS:        1.20 cm      LV e' medial:    6.53 cm/s LVOT diam:     2.40 cm      LV E/e' medial:  9.1 LV SV:         133 LV SV Index:   50 LVOT Area:     4.52  cm  LV Volumes (MOD) LV vol d, MOD A2C: 167.0 ml LV vol d, MOD A4C: 232.0 ml LV vol s, MOD A2C: 82.2 ml LV vol s, MOD A4C: 114.0 ml LV SV MOD A2C:     84.8 ml LV SV MOD A4C:     232.0 ml LV SV MOD BP:      109.8 ml RIGHT VENTRICLE RV S prime:     17.40 cm/s TAPSE (M-mode): 2.1 cm LEFT ATRIUM             Index       RIGHT ATRIUM           Index LA diam:        4.90 cm 1.84 cm/m  RA Area:     20.80 cm LA Vol (A2C):   77.8 ml 29.19 ml/m RA Volume:   62.80 ml  23.56 ml/m LA Vol (A4C):   87.5 ml 32.83 ml/m LA Biplane Vol: 83.9 ml 31.48 ml/m  AORTIC VALVE AV Area (Vmax):    3.29 cm AV Area (Vmean):   3.16 cm AV Area (VTI):     3.74 cm AV Vmax:           173.50 cm/s AV Vmean:          133.000 cm/s AV VTI:            0.354 m AV Peak Grad:      12.0 mmHg AV Mean Grad:      8.0 mmHg LVOT Vmax:         126.00 cm/s LVOT Vmean:        92.800 cm/s LVOT VTI:          0.293 m LVOT/AV VTI ratio: 0.83  AORTA Ao Root diam: 3.50 cm MITRAL VALVE MV Area (PHT): 2.82 cm    SHUNTS MV Decel Time: 269 msec    Systemic VTI:  0.29 m MV E velocity: 59.60 cm/s  Systemic Diam: 2.40 cm MV A velocity: 85.30 cm/s MV E/A ratio:  0.70 Buford Dresser MD Electronically signed by Buford Dresser MD Signature Date/Time: 08/12/2019/2:57:15 PM    Final    Lab Results  Component Value Date   WBC 4.6 05/15/2019   HGB 10.5 (L) 05/15/2019   HCT 34.9 (L) 05/15/2019   MCV 92.6 05/15/2019   PLT 139 (L) 05/15/2019    Lab Results  Component Value Date   CREATININE 2.53 (H) 07/01/2019   BUN 31 (H) 07/01/2019   NA 142 07/01/2019   K 4.4 07/01/2019   CL 104 07/01/2019   CO2 31 07/01/2019    Lab Results  Component Value Date   ALT 13 07/01/2019   AST 19 07/01/2019   ALKPHOS 91 07/01/2019   BILITOT 0.7 07/01/2019      Physical Exam: BP (!) 116/58 (BP Location: Left Arm, Patient Position: Sitting, Cuff Size: Large)   Pulse 76   Ht 5' 6.75" (1.695 m) Comment: height measured without shoes  Wt (!) 355 lb 4 oz (161.1  kg)   BMI  56.06 kg/m  Constitutional: Pleasant,well-developed, male in no acute distress. HEENT: Normocephalic and atraumatic. Conjunctivae are normal. No scleral icterus. Neck supple.  Cardiovascular: Normal rate, regular rhythm.  Pulmonary/chest: Effort normal and breath sounds normal. No wheezing, rales or rhonchi. Abdominal: Soft, nondistended, nontender. protuberant There are no masses palpable.  Extremities: no edema Lymphadenopathy: No cervical adenopathy noted. Neurological: Alert and oriented to person place and time. Skin: Skin is warm and dry. No rashes noted. Psychiatric: Normal mood and affect. Behavior is normal.   ASSESSMENT AND PLAN: 59 year old male here for new patient assessment the following:  Colon cancer screening / morbid obesity / anticoagulated - patient is at average risk for colon cancer and is overdue for screening.  We discussed options to include stool studies, virtual colonoscopy, optical colonoscopy.  I discussed risks and benefits of each of those, including risks for anesthesia if he wishes to pursue colonoscopy.  After discussion of options, his cardiac issues have been quite stable lately and he strongly wanted to proceed with colonoscopy.  Given his GFR in the low 30s he would need to hold Xarelto for 3 days prior to that exam, we will reach out to his prescribing provider to see if that is okay.  Given his medical problems and weight, his case will need to be done at the hospital for anesthesia support, we will need to put him on the wait list and do this at some point over the summer when an opening is available.  We will be contacting him to schedule in the next few months.  He agreed with the plan, further recommendations pending the results of his exam and his course  Anemia - chronic normocytic anemia which may very well be due to his chronic kidney disease and other medical problems, but will check iron studies to ensure no deficiency.  He  agreed  [place], MD [facility]  CC: [name], MD

## 2019-08-31 NOTE — Telephone Encounter (Signed)
Pharmacy, can you please give your recommends for holding Xarelto for upcoming procedure?  Thank you!

## 2019-08-31 NOTE — Patient Instructions (Signed)
Your provider has requested that you go to the basement level for lab work before leaving today. Press "B" on the elevator. The lab is located at the first door on the left as you exit the elevator.  We will be in contact with you when our schedule becomes available to schedule your Colonoscopy at the hospital.   We will go ahead and obtain clearance for your Xarelto to be stopped 3 days prior to your Colonoscopy and contact you.

## 2019-08-31 NOTE — Telephone Encounter (Signed)
Patient with diagnosis of afib, DVT and PE on Xarelto for anticoagulation.    Procedure: colonoscopy Date of procedure: TBD  CHADS2-VASc score of 4 (CHF, HTN, VTE)  CrCl 56mL/min using adjusted body weight Platelet count 139K  Request is to hold Xarelto for 3 days prior, relatively low bleed risk procedure. With history of recurrent VTE, prefer to minimize time off of anticoagulation. Typically would recommend holding Xarelto for 1 day prior to colonoscopy. With history of renal dysfunction, will defer to MD to see if 2 day hold is warranted instead.

## 2019-08-31 NOTE — Telephone Encounter (Signed)
Prefer 1 day but Ok with 2.

## 2019-09-01 NOTE — Telephone Encounter (Signed)
Thanks for the follow up. For CrCl < 59, ASGE guidelines recommend holding Xarelto for 3 days given increased risk for polypectomy related bleeding. Would it be okay to hold for 3 days? If not we may not be able to proceed with his exam. Thanks

## 2019-09-01 NOTE — Telephone Encounter (Signed)
Cardiology is approving a 1-2 day hold on Xarelto and not a 3 day hold. Is this ok Dr. Havery Moros?

## 2019-09-01 NOTE — Telephone Encounter (Signed)
   Primary Cardiologist: Shaune Pascal. Bensimhon, MD  Chart reviewed as part of pre-operative protocol coverage. Given past medical history and time since last visit, based on ACC/AHA guidelines, Phillip [name] would be at acceptable risk for the planned procedure without further cardiovascular testing.   Patient with diagnosis of afib, DVT and PE on Xarelto for anticoagulation.    Procedure: colonoscopy Date of procedure: TBD  CHADS2-VASc score of 4 (CHF, HTN, VTE)  CrCl 31mL/min using adjusted body weight Platelet count 139K  He may hold his Xarelto 1-2 days prior to this procedure.  Please restart as soon as hemostasis is achieved.   I will route this recommendation to the requesting party via Epic fax function and remove from pre-op pool.  Please call with questions.  Jossie Ng. Lonney Revak NP-C    09/01/2019, 8:08 AM [address] Office [phone] Fax [phone]

## 2019-09-05 NOTE — Telephone Encounter (Signed)
3 days ok by me.

## 2019-09-06 NOTE — Telephone Encounter (Signed)
   Primary Cardiologist: No primary care provider on file.  Chart reviewed as part of pre-operative protocol coverage. Given past medical history and time since last visit, based on ACC/AHA guidelines, Phillip [name] would be at acceptable risk for the planned procedure without further cardiovascular testing.   Procedure:colonoscopy Date of procedure:TBD  CHADS2-VASc score of4 (CHF, HTN, VTE)  CrCl58mL/min using adjusted body weight Platelet [phone]  He may hold his Xarelto 3 days prior to this procedure.  Please restart as soon as hemostasis is achieved.  I will route this recommendation to the requesting party via Epic fax function and remove from pre-op pool.  Please call with questions.  Jossie Ng. Phillip Cease NP-C    09/06/2019, 7:51 AM [address] Office [phone] Fax [phone]

## 2019-10-10 ENCOUNTER — Other Ambulatory Visit: Payer: Self-pay

## 2019-10-10 DIAGNOSIS — Z7901 Long term (current) use of anticoagulants: Secondary | ICD-10-CM

## 2019-10-10 DIAGNOSIS — Z1211 Encounter for screening for malignant neoplasm of colon: Secondary | ICD-10-CM

## 2019-10-10 DIAGNOSIS — D649 Anemia, unspecified: Secondary | ICD-10-CM

## 2019-10-10 NOTE — Progress Notes (Signed)
Called and spoke to pt. Will schedule his colon

## 2019-10-11 ENCOUNTER — Other Ambulatory Visit: Payer: Self-pay | Admitting: Internal Medicine

## 2019-10-19 DIAGNOSIS — I120 Hypertensive chronic kidney disease with stage 5 chronic kidney disease or end stage renal disease: Secondary | ICD-10-CM | POA: Insufficient documentation

## 2019-10-19 DIAGNOSIS — R809 Proteinuria, unspecified: Secondary | ICD-10-CM | POA: Insufficient documentation

## 2019-12-05 ENCOUNTER — Other Ambulatory Visit: Payer: Self-pay

## 2019-12-05 ENCOUNTER — Ambulatory Visit (AMBULATORY_SURGERY_CENTER): Payer: Self-pay

## 2019-12-05 VITALS — Ht 66.75 in | Wt 335.6 lb

## 2019-12-05 DIAGNOSIS — Z1211 Encounter for screening for malignant neoplasm of colon: Secondary | ICD-10-CM

## 2019-12-05 DIAGNOSIS — Z01818 Encounter for other preprocedural examination: Secondary | ICD-10-CM

## 2019-12-05 MED ORDER — NA SULFATE-K SULFATE-MG SULF 17.5-3.13-1.6 GM/177ML PO SOLN: 1.0000 | Freq: Once | ORAL | 0 refills | Status: AC

## 2019-12-05 NOTE — Progress Notes (Signed)
No egg or soy allergy known to patient  No issues with past sedation with any surgeries or procedures no intubation problems in the past  No FH of Malignant Hyperthermia No diet pills per patient No home 02 use per patient  No blood thinners per patient  Pt denies issues with constipation  No A fib or A flutter  EMMI video to pt or via MyChart  COVID 19 guidelines implemented in [facility] today with Pt and RN   Suprep Coupon given to pt in [facility] today , Code to Pharmacy   Due to the COVID-19 pandemic we are asking patients to follow these guidelines. Please only bring one care partner. Please be aware that your care partner may wait in the car in the parking lot or if they feel like they will be too hot to wait in the car, they may wait in the lobby on the 4th floor. All care partners are required to wear a mask the entire time (we do not have any that we can provide them), they need to practice social distancing, and we will do a Covid check for all patient's and care partners when you arrive. Also we will check their temperature and your temperature. If the care partner waits in their car they need to stay in the parking lot the entire time and we will call them on their cell phone when the patient is ready for discharge so they can bring the car to the front of the building. Also all patient's will need to wear a mask into building.  

## 2019-12-16 ENCOUNTER — Other Ambulatory Visit (HOSPITAL_COMMUNITY)
Admission: RE | Admit: 2019-12-16 | Discharge: 2019-12-16 | Disposition: A | Discharge status: Home / Self Care | Payer: Managed Care, Other (non HMO) | Source: Ambulatory Visit | Attending: Gastroenterology | Admitting: Gastroenterology

## 2019-12-16 DIAGNOSIS — Z01812 Encounter for preprocedural laboratory examination: Secondary | ICD-10-CM | POA: Insufficient documentation

## 2019-12-16 DIAGNOSIS — Z20822 Contact with and (suspected) exposure to covid-19: Secondary | ICD-10-CM | POA: Diagnosis not present

## 2019-12-16 LAB — SARS CORONAVIRUS 2 (TAT 6-24 HRS): SARS Coronavirus 2: NEGATIVE

## 2019-12-19 ENCOUNTER — Telehealth: Payer: Self-pay | Admitting: Gastroenterology

## 2019-12-19 NOTE — Telephone Encounter (Signed)
Spoke with patient, he states that he could not remember which medications that they told him that he could take before his procedure tomorrow. Provided patient with the [facility] number for clarification.

## 2019-12-20 ENCOUNTER — Other Ambulatory Visit: Payer: Self-pay

## 2019-12-20 ENCOUNTER — Ambulatory Visit (HOSPITAL_COMMUNITY)
Admission: RE | Admit: 2019-12-20 | Discharge: 2019-12-20 | Disposition: A | Discharge status: Home / Self Care | Payer: Managed Care, Other (non HMO) | Attending: Gastroenterology | Admitting: Gastroenterology

## 2019-12-20 ENCOUNTER — Encounter (HOSPITAL_COMMUNITY): Payer: Self-pay | Admitting: Gastroenterology

## 2019-12-20 ENCOUNTER — Ambulatory Visit (HOSPITAL_COMMUNITY): Payer: Managed Care, Other (non HMO) | Admitting: Anesthesiology

## 2019-12-20 ENCOUNTER — Encounter (HOSPITAL_COMMUNITY)
Admission: RE | Disposition: A | Discharge status: Home / Self Care | Payer: Self-pay | Source: Home / Self Care | Attending: Gastroenterology

## 2019-12-20 DIAGNOSIS — Z86711 Personal history of pulmonary embolism: Secondary | ICD-10-CM | POA: Insufficient documentation

## 2019-12-20 DIAGNOSIS — Z8249 Family history of ischemic heart disease and other diseases of the circulatory system: Secondary | ICD-10-CM | POA: Diagnosis not present

## 2019-12-20 DIAGNOSIS — Z1211 Encounter for screening for malignant neoplasm of colon: Secondary | ICD-10-CM | POA: Diagnosis not present

## 2019-12-20 DIAGNOSIS — Z7901 Long term (current) use of anticoagulants: Secondary | ICD-10-CM | POA: Diagnosis not present

## 2019-12-20 DIAGNOSIS — I480 Paroxysmal atrial fibrillation: Secondary | ICD-10-CM | POA: Diagnosis not present

## 2019-12-20 DIAGNOSIS — N189 Chronic kidney disease, unspecified: Secondary | ICD-10-CM | POA: Insufficient documentation

## 2019-12-20 DIAGNOSIS — K5730 Diverticulosis of large intestine without perforation or abscess without bleeding: Secondary | ICD-10-CM | POA: Insufficient documentation

## 2019-12-20 DIAGNOSIS — I130 Hypertensive heart and chronic kidney disease with heart failure and stage 1 through stage 4 chronic kidney disease, or unspecified chronic kidney disease: Secondary | ICD-10-CM | POA: Insufficient documentation

## 2019-12-20 DIAGNOSIS — Z86718 Personal history of other venous thrombosis and embolism: Secondary | ICD-10-CM | POA: Insufficient documentation

## 2019-12-20 DIAGNOSIS — Z6841 Body Mass Index (BMI) 40.0 and over, adult: Secondary | ICD-10-CM | POA: Diagnosis not present

## 2019-12-20 DIAGNOSIS — G4733 Obstructive sleep apnea (adult) (pediatric): Secondary | ICD-10-CM | POA: Diagnosis not present

## 2019-12-20 DIAGNOSIS — J449 Chronic obstructive pulmonary disease, unspecified: Secondary | ICD-10-CM | POA: Diagnosis not present

## 2019-12-20 DIAGNOSIS — D122 Benign neoplasm of ascending colon: Secondary | ICD-10-CM | POA: Diagnosis not present

## 2019-12-20 DIAGNOSIS — M109 Gout, unspecified: Secondary | ICD-10-CM | POA: Insufficient documentation

## 2019-12-20 DIAGNOSIS — Z87891 Personal history of nicotine dependence: Secondary | ICD-10-CM | POA: Insufficient documentation

## 2019-12-20 DIAGNOSIS — K648 Other hemorrhoids: Secondary | ICD-10-CM | POA: Diagnosis not present

## 2019-12-20 DIAGNOSIS — D649 Anemia, unspecified: Secondary | ICD-10-CM

## 2019-12-20 DIAGNOSIS — I509 Heart failure, unspecified: Secondary | ICD-10-CM | POA: Diagnosis not present

## 2019-12-20 HISTORY — PX: COLONOSCOPY WITH PROPOFOL: SHX5780

## 2019-12-20 HISTORY — PX: POLYPECTOMY: SHX5525

## 2019-12-20 SURGERY — COLONOSCOPY WITH PROPOFOL
Anesthesia: Monitor Anesthesia Care

## 2019-12-20 MED ORDER — PROPOFOL 500 MG/50ML IV EMUL
INTRAVENOUS | Status: DC | PRN
  Administered 2019-12-20: 125 ug/kg/min via INTRAVENOUS

## 2019-12-20 MED ORDER — LACTATED RINGERS IV SOLN: INTRAVENOUS | Status: DC

## 2019-12-20 MED ORDER — SODIUM CHLORIDE 0.9 % IV SOLN: INTRAVENOUS | Status: DC

## 2019-12-20 MED ORDER — PROPOFOL 10 MG/ML IV BOLUS
INTRAVENOUS | Status: DC | PRN
  Administered 2019-12-20: 30 mg via INTRAVENOUS

## 2019-12-20 MED ORDER — PROPOFOL 500 MG/50ML IV EMUL
INTRAVENOUS | Status: AC
  Filled 2019-12-20: qty 100

## 2019-12-20 SURGICAL SUPPLY — 21 items

## 2019-12-20 NOTE — Discharge Instructions (Signed)
YOU HAD AN ENDOSCOPIC PROCEDURE TODAY: Refer to the procedure report and other information in the discharge instructions given to you for any specific questions about what was found during the examination. If this information does not answer your questions, please call [place] office at [phone] to clarify. Resume xarelto tomorrow.  YOU SHOULD EXPECT: Some feelings of bloating in the abdomen. Passage of more gas than usual. Walking can help get rid of the air that was put into your GI tract during the procedure and reduce the bloating. If you had a lower endoscopy (such as a colonoscopy or flexible sigmoidoscopy) you may notice spotting of blood in your stool or on the toilet paper. Some abdominal soreness may be present for a day or two, also.  DIET: Your first meal following the procedure should be a light meal and then it is ok to progress to your normal diet. A half-sandwich or bowl of soup is an example of a good first meal. Heavy or fried foods are harder to digest and may make you feel nauseous or bloated. Drink plenty of fluids but you should avoid alcoholic beverages for 24 hours. If you had a esophageal dilation, please see attached instructions for diet.    ACTIVITY: Your care partner should take you home directly after the procedure. You should plan to take it easy, moving slowly for the rest of the day. You can resume normal activity the day after the procedure however YOU SHOULD NOT DRIVE, use power tools, machinery or perform tasks that involve climbing or major physical exertion for 24 hours (because of the sedation medicines used during the test).   SYMPTOMS TO REPORT IMMEDIATELY: A gastroenterologist can be reached at any hour. Please call [phone]  for any of the following symptoms:  . Following lower endoscopy (colonoscopy, flexible sigmoidoscopy) Excessive amounts of blood in the stool  Significant tenderness, worsening of abdominal pains  Swelling of the abdomen that is  new, acute  Fever of 100 or higher  . Following upper endoscopy (EGD, EUS, ERCP, esophageal dilation) Vomiting of blood or coffee ground material  New, significant abdominal pain  New, significant chest pain or pain under the shoulder blades  Painful or persistently difficult swallowing  New shortness of breath  Black, tarry-looking or red, bloody stools  FOLLOW UP:  If any biopsies were taken you will be contacted by phone or by letter within the next 1-3 weeks. Call [phone]  if you have not heard about the biopsies in 3 weeks.  Please also call with any specific questions about appointments or follow up tests.

## 2019-12-20 NOTE — Anesthesia Preprocedure Evaluation (Signed)
Anesthesia Evaluation  Patient identified by MRN, date of birth, ID band Patient awake    Reviewed: Allergy & Precautions, NPO status , Patient's Chart, lab work & pertinent test results  Airway Mallampati: III  TM Distance: <3 FB Neck ROM: Full    Dental no notable dental hx.    Pulmonary sleep apnea , COPD, former smoker,    breath sounds clear to auscultation + decreased breath sounds      Cardiovascular hypertension, +CHF  Normal cardiovascular exam+ dysrhythmias Atrial Fibrillation  Rhythm:Regular Rate:Normal     Neuro/Psych negative neurological ROS  negative psych ROS   GI/Hepatic negative GI ROS, Neg liver ROS,   Endo/Other  Morbid obesity  Renal/GU negative Renal ROS  negative genitourinary   Musculoskeletal negative musculoskeletal ROS (+)   Abdominal (+) + obese,   Peds negative pediatric ROS (+)  Hematology negative hematology ROS (+)   Anesthesia Other Findings   Reproductive/Obstetrics negative OB ROS                             Anesthesia Physical Anesthesia Plan  ASA: IV  Anesthesia Plan: MAC   Post-op Pain Management:    Induction: Intravenous  PONV Risk Score and Plan:   Airway Management Planned: Simple Face Mask  Additional Equipment:   Intra-op Plan:   Post-operative Plan:   Informed Consent: I have reviewed the patients History and Physical, chart, labs and discussed the procedure including the risks, benefits and alternatives for the proposed anesthesia with the patient or authorized representative who has indicated his/her understanding and acceptance.     Dental advisory given  Plan Discussed with: CRNA and Surgeon  Anesthesia Plan Comments:         Anesthesia Quick Evaluation

## 2019-12-20 NOTE — Interval H&P Note (Signed)
History and Physical Interval Note:  12/20/2019 10:33 AM  Phillip [name]  has presented today for surgery, with the diagnosis of colon cancer screening.  The various methods of treatment have been discussed with the patient and family. After consideration of risks, benefits and other options for treatment, the patient has consented to  Procedure(s): COLONOSCOPY WITH PROPOFOL (N/A) as a surgical intervention.  The patient's history has been reviewed, patient examined, no change in status, stable for surgery.  I have reviewed the patient's chart and labs.  Questions were answered to the patient's satisfaction.     [place]

## 2019-12-20 NOTE — Transfer of Care (Signed)
Immediate Anesthesia Transfer of Care Note  Patient: Azel Noland  Procedure(s) Performed: COLONOSCOPY WITH PROPOFOL (N/A ) POLYPECTOMY  Patient Location: Endoscopy Unit  Anesthesia Type:MAC  Level of Consciousness: awake, alert , oriented and patient cooperative  Airway & Oxygen Therapy: Patient Spontanous Breathing and Patient connected to face mask  Post-op Assessment: Report given to RN and Post -op Vital signs reviewed and stable  Post vital signs: Reviewed and stable  Last Vitals:  Vitals Value Taken Time  BP    Temp    Pulse 65 12/20/19 1213  Resp 18 12/20/19 1213  SpO2 100 % 12/20/19 1213  Vitals shown include unvalidated device data.  Last Pain:  Vitals:   12/20/19 1048  TempSrc: Tympanic         Complications: No complications documented.

## 2019-12-20 NOTE — H&P (Signed)
HPI :  59 y/o male with history of BMI > 50, AF / history of DVT on Xarelto, here for first time screening colonoscopy. Feels at baseline today, last dose of Xarelto was 3 days ago. At the hospital for his colonoscopy for anesthesia support in light of his weight. No cardiopulmonary or other complaints today.   Past Medical History:  Diagnosis Date  . Asthma   . CHF (congestive heart failure) ([place]) 11/15   EF 40-45%, improved with follow-up  . Chronic kidney disease   . COPD (chronic obstructive pulmonary disease) ([place])   . DVT (deep venous thrombosis) ([place])   . Gout   . Hypertension   . Hypertensive cardiovascular disease   . Kidney dysfunction   . Morbid obesity ([place])   . OSA on CPAP   . Paroxysmal atrial fibrillation ([facility])   . PNA (pneumonia)   . Pulmonary embolism ([place]) 2010   left leg DVT  . Sleep apnea    uses cpap     Past Surgical History:  Procedure Laterality Date  . A-FLUTTER ABLATION N/A 07/22/2017   Procedure: A-FLUTTER ABLATION;  Surgeon: Thompson Grayer, MD;  Location: [place] [facility];  Service: Cardiovascular;  Laterality: N/A;  . CARDIOVERSION N/A 10/14/2018   Procedure: CARDIOVERSION;  Surgeon: Jolaine Artist, MD;  Location: [facility] ENDOSCOPY;  Service: Cardiovascular;  Laterality: N/A;  . CARDIOVERSION N/A 04/22/2019   Procedure: CARDIOVERSION;  Surgeon: Thayer Headings, MD;  Location: [facility] ENDOSCOPY;  Service: Cardiovascular;  Laterality: N/A;  . CARDIOVERSION N/A 05/10/2019   Procedure: CARDIOVERSION;  Surgeon: Jolaine Artist, MD;  Location: [facility] ENDOSCOPY;  Service: Cardiovascular;  Laterality: N/A;  . UMBILICAL HERNIA REPAIR  1992   Family History  Problem Relation Age of Onset  . Hypertension Mother   . Diabetes Father   . CAD Father   . Clotting disorder Father   . Heart disease Father        transplant  . Stomach cancer Father   . Allergies Brother   . Colon cancer Neg Hx   . Colon polyps Neg Hx   . Esophageal cancer Neg Hx   . Rectal  cancer Neg Hx    Social History   Tobacco Use  . Smoking status: Former Smoker    Packs/day: 0.25    Years: 1.00    Pack years: 0.25    Types: Cigarettes    Quit date: 07/23/1989    Years since quitting: 30.4  . Smokeless tobacco: Never Used  Vaping Use  . Vaping Use: Never used  Substance Use Topics  . Alcohol use: No    Alcohol/week: 0.0 standard drinks  . Drug use: No   Current Facility-Administered Medications  Medication Dose Route Frequency Provider Last Rate Last Admin  . 0.9 %  sodium chloride infusion   Intravenous Continuous Jaasia Viglione, Carlota Raspberry, MD      . lactated ringers infusion   Intravenous Continuous Revan Gendron, Carlota Raspberry, MD       Allergies  Allergen Reactions  . Other Anaphylaxis    mushrooms     Review of Systems: All systems reviewed and negative except where noted in HPI.   Lab Results  Component Value Date   WBC 4.6 05/15/2019   HGB 10.5 (L) 05/15/2019   HCT 34.9 (L) 05/15/2019   MCV 92.6 05/15/2019   PLT 139 (L) 05/15/2019    Lab Results  Component Value Date   CREATININE 2.53 (H) 07/01/2019   BUN 31 (H) 07/01/2019  NA 142 07/01/2019   K 4.4 07/01/2019   CL 104 07/01/2019   CO2 31 07/01/2019    Lab Results  Component Value Date   ALT 13 07/01/2019   AST 19 07/01/2019   ALKPHOS 91 07/01/2019   BILITOT 0.7 07/01/2019    Lab Results  Component Value Date   IRON 97 08/31/2019   TIBC 334 03/23/2017   FERRITIN 83.2 08/31/2019     Physical Exam: There were no vitals taken for this visit. Constitutional: Pleasant,well-developed, male in no acute distress. Cardiovascular: Normal rate, regular rhythm.  Pulmonary/chest: Effort normal and breath sounds normal.  Abdominal: Soft, protuberant, nontender. There are no masses palpable. Neurological: Alert and oriented to person place and time. Psychiatric: Normal mood and affect. Behavior is normal.   ASSESSMENT AND PLAN: 59 y/o male here for first time colonoscopy for screening,  done at the hospital in light of BMI. On Xarelto for history of AF and DVT, he has held it for 3 days given his CKD. I have discussed risks / benefits of colonoscopy and anesthesia with him and he wants to proceed. Further recommendations pending the results.   [place] Cellar, MD [facility] Gastroenterology

## 2019-12-20 NOTE — Op Note (Signed)
[facility] Patient Name: Phillip [name] Procedure Date: 12/20/2019 MRN: [id number] Attending MD: Carlota Raspberry. Havery Moros , MD Date of Birth: 1960/11/05 CSN: [id number] Age: 59 Admit Type: Outpatient Procedure:                Colonoscopy Indications:              Screening for colorectal malignant neoplasm, This                            is the patient's first colonoscopy Providers:                Carlota Raspberry. Havery Moros, MD, Josie Dixon, RN,                            Particia Nearing, RN, Cletis Athens, Technician, Dellie Catholic Referring MD:              Medicines:                Monitored Anesthesia Care Complications:            No immediate complications. Estimated blood loss:                            Minimal. Estimated Blood Loss:     Estimated blood loss was minimal. Procedure:                Pre-Anesthesia Assessment:                           - Prior to the procedure, a History and Physical                            was performed, and patient medications and                            allergies were reviewed. The patient's tolerance of                            previous anesthesia was also reviewed. The risks                            and benefits of the procedure and the sedation                            options and risks were discussed with the patient.                            All questions were answered, and informed consent                            was obtained. Prior Anticoagulants: The patient has                            taken Xarelto (rivaroxaban), last  dose was 3 days                            prior to procedure. ASA Grade Assessment: III - A                            patient with severe systemic disease. After                            reviewing the risks and benefits, the patient was                            deemed in satisfactory condition to undergo the                            procedure.                            After obtaining informed consent, the colonoscope                            was passed under direct vision. Throughout the                            procedure, the patient's blood pressure, pulse, and                            oxygen saturations were monitored continuously. The                            CF-HQ190L ([id number]) Olympus colonoscope was                            introduced through the anus and advanced to the the                            cecum, identified by appendiceal orifice and                            ileocecal valve. The colonoscopy was performed                            without difficulty. The patient tolerated the                            procedure well. The quality of the bowel                            preparation was good. The ileocecal valve,                            appendiceal orifice, and rectum were photographed. Scope In: 11:41:20 AM Scope Out: 12:07:05 PM Scope Withdrawal Time: 0 hours 20 minutes 25 seconds  Total Procedure Duration: 0 hours 25 minutes 45 seconds  Findings:  The perianal and digital rectal examinations were normal.      A 3 mm polyp was found in the ascending colon. The polyp was sessile.       The polyp was removed with a cold snare. Resection and retrieval were       complete.      A few small-mouthed diverticula were found in the sigmoid colon.      The sigmoid colon was redundant.      Internal hemorrhoids were found during retroflexion.      The exam was otherwise without abnormality. Impression:               - One 3 mm polyp in the ascending colon, removed                            with a cold snare. Resected and retrieved.                           - Diverticulosis in the sigmoid colon.                           - Redundant colon.                           - Internal hemorrhoids.                           - The examination was otherwise normal. Moderate Sedation:      No moderate sedation, case performed with  MAC Recommendation:           - Patient has a contact number available for                            emergencies. The signs and symptoms of potential                            delayed complications were discussed with the                            patient. Return to normal activities tomorrow.                            Written discharge instructions were provided to the                            patient.                           - Resume previous diet.                           - Continue present medications.                           - Resume Xarelto tomorrow                           - Await pathology results. Procedure Code(s):        ---  Professional ---                           [phone], Colonoscopy, flexible; with removal of                            tumor(s), polyp(s), or other lesion(s) by snare                            technique Diagnosis Code(s):        --- Professional ---                           Z12.11, Encounter for screening for malignant                            neoplasm of colon                           K63.5, Polyp of colon                           K64.8, Other hemorrhoids                           K57.30, Diverticulosis of large intestine without                            perforation or abscess without bleeding                           Q43.8, Other specified congenital malformations of                            intestine CPT copyright 2019 American Medical Association. All rights reserved. The codes documented in this report are preliminary and upon coder review may  be revised to meet current compliance requirements. Remo Lipps P. Asaiah Hunnicutt, MD 12/20/2019 12:12:34 PM This report has been signed electronically. Number of Addenda: 0

## 2019-12-20 NOTE — Anesthesia Postprocedure Evaluation (Signed)
Anesthesia Post Note  Patient: Phillip [name]  Procedure(s) Performed: COLONOSCOPY WITH PROPOFOL (N/A ) POLYPECTOMY     Patient location during evaluation: PACU Anesthesia Type: MAC Level of consciousness: awake and alert Pain management: pain level controlled Vital Signs Assessment: post-procedure vital signs reviewed and stable Respiratory status: spontaneous breathing, nonlabored ventilation, respiratory function stable and patient connected to nasal cannula oxygen Cardiovascular status: stable and blood pressure returned to baseline Postop Assessment: no apparent nausea or vomiting Anesthetic complications: no   No complications documented.  Last Vitals:  Vitals:   12/20/19 1220 12/20/19 1230  BP: (!) 132/48 (!) 152/48  Pulse: (!) 46 60  Resp: (!) 22 15  Temp: (!) 36.1 C   SpO2: 100% 100%    Last Pain:  Vitals:   12/20/19 1230  TempSrc:   PainSc: 0-No pain                 Errica Dutil S

## 2019-12-22 ENCOUNTER — Encounter (HOSPITAL_COMMUNITY): Payer: Self-pay | Admitting: Gastroenterology

## 2019-12-22 LAB — SURGICAL PATHOLOGY

## 2019-12-24 ENCOUNTER — Other Ambulatory Visit: Payer: Self-pay | Admitting: Internal Medicine

## 2019-12-26 NOTE — Telephone Encounter (Signed)
Prescription refill request for Xarelto received.   Last office visit: [place], 08/01/2019 Weight: 148.8 kg Age: 59 y.o. Scr:  2.72, 05/23/2019 CrCl: 63 ml/min   Prescription refill sent.

## 2020-01-05 ENCOUNTER — Other Ambulatory Visit: Payer: Self-pay | Admitting: Internal Medicine

## 2020-01-11 ENCOUNTER — Other Ambulatory Visit (HOSPITAL_COMMUNITY): Payer: Self-pay | Admitting: Cardiology

## 2020-01-30 ENCOUNTER — Other Ambulatory Visit (HOSPITAL_COMMUNITY): Payer: Self-pay | Admitting: Adult Health

## 2020-01-30 DIAGNOSIS — E876 Hypokalemia: Secondary | ICD-10-CM

## 2020-03-09 ENCOUNTER — Encounter: Payer: Self-pay | Admitting: Internal Medicine

## 2020-03-09 ENCOUNTER — Ambulatory Visit (INDEPENDENT_AMBULATORY_CARE_PROVIDER_SITE_OTHER): Payer: Managed Care, Other (non HMO) | Admitting: Internal Medicine

## 2020-03-09 VITALS — BP 132/68 | HR 62 | Temp 98.0°F | Ht 67.5 in | Wt 323.0 lb

## 2020-03-09 DIAGNOSIS — Z0000 Encounter for general adult medical examination without abnormal findings: Secondary | ICD-10-CM | POA: Diagnosis not present

## 2020-03-09 DIAGNOSIS — M1000 Idiopathic gout, unspecified site: Secondary | ICD-10-CM | POA: Diagnosis not present

## 2020-03-09 DIAGNOSIS — Z9989 Dependence on other enabling machines and devices: Secondary | ICD-10-CM

## 2020-03-09 DIAGNOSIS — I480 Paroxysmal atrial fibrillation: Secondary | ICD-10-CM

## 2020-03-09 DIAGNOSIS — Z125 Encounter for screening for malignant neoplasm of prostate: Secondary | ICD-10-CM | POA: Diagnosis not present

## 2020-03-09 DIAGNOSIS — G4733 Obstructive sleep apnea (adult) (pediatric): Secondary | ICD-10-CM

## 2020-03-09 DIAGNOSIS — J449 Chronic obstructive pulmonary disease, unspecified: Secondary | ICD-10-CM

## 2020-03-09 DIAGNOSIS — N184 Chronic kidney disease, stage 4 (severe): Secondary | ICD-10-CM

## 2020-03-09 DIAGNOSIS — I5032 Chronic diastolic (congestive) heart failure: Secondary | ICD-10-CM

## 2020-03-09 LAB — PSA: PSA: 0.53 ng/mL (ref 0.10–4.00)

## 2020-03-09 LAB — URIC ACID: Uric Acid, Serum: 3.8 mg/dL — ABNORMAL LOW (ref 4.0–7.8)

## 2020-03-09 NOTE — Progress Notes (Signed)
Subjective:    Patient ID: Phillip [name], male    DOB: 1960/06/02, 59 y.o.   MRN: [id number]  HPI Here for physical This visit occurred during the SARS-CoV-2 public health emergency.  Safety protocols were in place, including screening questions prior to the visit, additional usage of staff PPE, and extensive cleaning of exam room while observing appropriate contact time as indicated for disinfecting solutions.   Has lost 50# in past year No fried food--cutting back on portions Does treadmill every morning--and weight Still drives dump truck  Keeping up with Dr Candiss Norse GFR actually slightly better Known secondary hyperparathyroidism  Heart fine since last cardioversion No palpitations Some edema if prolonged time in the truck No chest pain Sleeps on 1 pillow--no PND Exercise tolerance is improving with exercise  Gout is quiet Careful with his eating  Current Outpatient Medications on File Prior to Visit  Medication Sig Dispense Refill  . albuterol (PROAIR HFA) 108 (90 Base) MCG/ACT inhaler Inhale 2 puffs into the lungs every 4 (four) hours as needed for wheezing or shortness of breath. 1 Inhaler 3  . albuterol (PROVENTIL) (2.5 MG/3ML) 0.083% nebulizer solution INHALE 1 VIAL VIA NEBULIZER EVERY 6 HOURS AS NEEDED FOR WHEEZING/SHORTNESS OF BREATH. (Patient taking differently: Take 2.5 mg by nebulization every 6 (six) hours as needed for wheezing or shortness of breath.) 375 mL 11  . allopurinol (ZYLOPRIM) 300 MG tablet TAKE 1 TABLET BY MOUTH EVERY DAY 30 tablet 11  . carvedilol (COREG) 6.25 MG tablet TAKE 1 TABLET TWICE A DAY WITH MEALS (Patient taking differently: Take 6.25 mg by mouth 2 (two) times daily with a meal.) 180 tablet 3  . Cholecalciferol (VITAMIN D3) 50 MCG (2000 UT) TABS Take 2,000 Units by mouth daily.    . cloNIDine (CATAPRES) 0.1 MG tablet TAKE 1 TABLET BY MOUTH THREE TIMES A DAY (Patient taking differently: Take 0.1 mg by mouth in the morning, at noon, and at bedtime.)  90 tablet 3  . colchicine 0.6 MG tablet Take 0.6 mg by mouth daily as needed (gout flares).     . hydrALAZINE (APRESOLINE) 100 MG tablet Take 1 tablet (100 mg total) by mouth 3 (three) times daily. 270 tablet 3  . KLOR-CON M20 20 MEQ tablet TAKE 2 TABLETS (40 MEQ) EVERY MORNING AND 1 TABLET (20 MEQ) EVERY EVENING 270 tablet 3  . montelukast (SINGULAIR) 10 MG tablet TAKE 1 TABLET BY MOUTH EVERYDAY AT BEDTIME (Patient taking differently: Take 10 mg by mouth at bedtime.) 30 tablet 0  . naproxen sodium (ALEVE) 220 MG tablet Take 440 mg by mouth 2 (two) times daily as needed (knee pain.).    [name] oxymetazoline (AFRIN NASAL SPRAY) 0.05 % nasal spray Place 1 spray into both nostrils 2 (two) times daily as needed for congestion.    . pantoprazole (PROTONIX) 40 MG tablet Take 40 mg by mouth 2 (two) times daily before a meal.    . SYMBICORT 80-4.5 MCG/ACT inhaler USE 2 INHALATIONS TWICE A DAY (NEED APPOINTMENT) (Patient taking differently: Inhale 2 puffs into the lungs in the morning and at bedtime.) 30.6 g 3  . torsemide (DEMADEX) 20 MG tablet TAKE 3 TABLETS TWICE A DAY 540 tablet 3  . UNABLE TO FIND Med Name: CPAP with sleep    . XARELTO 20 MG TABS tablet TAKE 1 TABLET DAILY WITH SUPPER 90 tablet 1   No current facility-administered medications on file prior to visit.    Allergies  Allergen Reactions  . Other Anaphylaxis  mushrooms    Past Medical History:  Diagnosis Date  . Asthma   . CHF (congestive heart failure) ([place]) 11/15   EF 40-45%, improved with follow-up  . Chronic kidney disease   . COPD (chronic obstructive pulmonary disease) ([place])   . DVT (deep venous thrombosis) ([place])   . Gout   . Hypertension   . Hypertensive cardiovascular disease   . Kidney dysfunction   . Morbid obesity ([place])   . OSA on CPAP   . Paroxysmal atrial fibrillation ([facility])   . PNA (pneumonia)   . Pulmonary embolism ([place]) 2010   left leg DVT  . Sleep apnea    uses cpap    Past Surgical History:  Procedure  Laterality Date  . A-FLUTTER ABLATION N/A 07/22/2017   Procedure: A-FLUTTER ABLATION;  Surgeon: Thompson Grayer, MD;  Location: [place] [facility] LAB;  Service: Cardiovascular;  Laterality: N/A;  . CARDIOVERSION N/A 10/14/2018   Procedure: CARDIOVERSION;  Surgeon: Jolaine Artist, MD;  Location: [facility] ENDOSCOPY;  Service: Cardiovascular;  Laterality: N/A;  . CARDIOVERSION N/A 04/22/2019   Procedure: CARDIOVERSION;  Surgeon: Thayer Headings, MD;  Location: [facility] ENDOSCOPY;  Service: Cardiovascular;  Laterality: N/A;  . CARDIOVERSION N/A 05/10/2019   Procedure: CARDIOVERSION;  Surgeon: Jolaine Artist, MD;  Location: [place];  Service: Cardiovascular;  Laterality: N/A;  . COLONOSCOPY WITH PROPOFOL N/A 12/20/2019   Procedure: COLONOSCOPY WITH PROPOFOL;  Surgeon: Yetta Flock, MD;  Location: [facility] ENDOSCOPY;  Service: Gastroenterology;  Laterality: N/A;  . POLYPECTOMY  12/20/2019   Procedure: POLYPECTOMY;  Surgeon: Yetta Flock, MD;  Location: [facility] ENDOSCOPY;  Service: Gastroenterology;;  . UMBILICAL HERNIA REPAIR  1992    Family History  Problem Relation Age of Onset  . Hypertension Mother   . Diabetes Father   . CAD Father   . Clotting disorder Father   . Heart disease Father        transplant  . Stomach cancer Father   . Allergies Brother   . Colon cancer Neg Hx   . Colon polyps Neg Hx   . Esophageal cancer Neg Hx   . Rectal cancer Neg Hx     Social History   Socioeconomic History  . Marital status: Divorced    Spouse name: Not on file  . Number of children: 2  . Years of education: 63  . Highest education level: Not on file  Occupational History  . Occupation: Drives dump truck    Comment:    . Occupation: Airline pilot    Comment: Disabled  Tobacco Use  . Smoking status: Former Smoker    Packs/day: 0.25    Years: 1.00    Pack years: 0.25    Types: Cigarettes    Quit date: 07/23/1989    Years since quitting: 30.6  . Smokeless tobacco: Never Used  Vaping Use   . Vaping Use: Never used  Substance and Sexual Activity  . Alcohol use: No    Alcohol/week: 0.0 standard drinks  . Drug use: No  . Sexual activity: Not Currently  Other Topics Concern  . Not on file  Social History Narrative   Pt lives in [place], alone.   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   Has 2 sons in [place]      No living will   Would want sons to make decisions for him   Would accept resuscitation   Social Determinants of Health   Financial Resource Strain:  Not on file  Food Insecurity: Not on file  Transportation Needs: Not on file  Physical Activity: Not on file  Stress: Not on file  Social Connections: Not on file  Intimate Partner Violence: Not on file   Review of Systems  Constitutional: Negative for fatigue.       Wears seat belt  HENT: Negative for hearing loss and tinnitus.        Needs dental work  Eyes: Negative for visual disturbance.       No diplopia or unilateral vision loss  Respiratory: Negative for cough, chest tightness, shortness of breath and wheezing.   Cardiovascular: Negative for chest pain and palpitations.  Gastrointestinal: Negative for blood in stool and constipation.       No heartburn  Endocrine: Negative for polydipsia and polyuria.  Genitourinary: Negative for difficulty urinating and urgency.       Some ED--better with weight loss  Musculoskeletal: Negative for back pain and joint swelling.       Left knee pain---rare aleve (discussed taking tylenol instead)  Skin: Negative for rash.  Allergic/Immunologic: Negative for environmental allergies and immunocompromised state.  Neurological: Negative for dizziness, syncope, light-headedness and headaches.  Hematological: Negative for adenopathy. Bruises/bleeds easily.  Psychiatric/Behavioral: Negative for dysphoric mood and sleep disturbance. The patient is not nervous/anxious.        Objective:   Physical Exam Constitutional:       Appearance: Normal appearance.  HENT:     Right Ear: Tympanic membrane, ear canal and external ear normal.     Left Ear: Tympanic membrane, ear canal and external ear normal.     Mouth/Throat:     Pharynx: No oropharyngeal exudate or posterior oropharyngeal erythema.  Eyes:     Conjunctiva/sclera: Conjunctivae normal.     Pupils: Pupils are equal, round, and reactive to light.  Cardiovascular:     Rate and Rhythm: Normal rate and regular rhythm.     Pulses: Normal pulses.     Heart sounds: No murmur heard. No gallop.   Pulmonary:     Effort: Pulmonary effort is normal.     Breath sounds: Normal breath sounds. No wheezing or rales.  Abdominal:     Palpations: Abdomen is soft.     Tenderness: There is no abdominal tenderness.  Musculoskeletal:     Cervical back: Neck supple.     Right lower leg: No edema.     Left lower leg: No edema.  Lymphadenopathy:     Cervical: No cervical adenopathy.  Skin:    General: Skin is warm.     Findings: No rash.  Neurological:     General: No focal deficit present.     Mental Status: He is alert and oriented to person, place, and time.  Psychiatric:        Mood and Affect: Mood normal.        Behavior: Behavior normal.            Assessment & Plan:

## 2020-03-09 NOTE — Assessment & Plan Note (Signed)
CPAP every night and it helps

## 2020-03-09 NOTE — Assessment & Plan Note (Signed)
Managed with the symbicort

## 2020-03-09 NOTE — Assessment & Plan Note (Signed)
Quiet on allopurinol Has colchicine for prn

## 2020-03-09 NOTE — Assessment & Plan Note (Signed)
Great work on fitness Recent colon--due again in 10 years Will check PSA Getting COVID booster and flu vaccine soon

## 2020-03-09 NOTE — Assessment & Plan Note (Signed)
Slightly better now Follows with Dr Candiss Norse

## 2020-03-09 NOTE — Assessment & Plan Note (Signed)
Compensated now On the torsemide and carvedilolo

## 2020-03-09 NOTE — Assessment & Plan Note (Signed)
Regular after cardioversion On the xarelto

## 2020-03-22 ENCOUNTER — Telehealth: Payer: Self-pay

## 2020-03-22 NOTE — Telephone Encounter (Signed)
Spoke to pt

## 2020-03-22 NOTE — Telephone Encounter (Signed)
Since he is COVID positive, he doesn't need an antibiotic (and he wouldn't have if negative since it would have indicated a different viral infection). There should be no problem with him taking the prednisone (in terms of his atrial fib or otherwise)

## 2020-03-22 NOTE — Telephone Encounter (Signed)
Started with cough 2 days ago. Fever yesterday 101.4. Tested negative for Covid. Cough and chest congestion is getting worse. Pt is asking for a Z-Pac since we are closed for the holiday.He is taking Robitussin CF without much relief. He is using his Symbicort daily. Has not had to use his albuterol yet. Please call in to [facility]. Can call pt at [phone] with any questions.

## 2020-03-22 NOTE — Telephone Encounter (Signed)
Pt called back to say to say he went to urgent care and tested positive for Covid. Lungs were clear. He has had 2 of 3 vaccines. Not sure he needs antibiotic now. They are sending in prednisone for him. He is asking if that will effect his A.Fib?

## 2020-06-04 ENCOUNTER — Other Ambulatory Visit (HOSPITAL_COMMUNITY): Payer: Self-pay | Admitting: Internal Medicine

## 2020-06-06 ENCOUNTER — Other Ambulatory Visit: Payer: Self-pay | Admitting: Internal Medicine

## 2020-06-25 ENCOUNTER — Other Ambulatory Visit: Payer: Self-pay | Admitting: Internal Medicine

## 2020-07-02 ENCOUNTER — Other Ambulatory Visit: Payer: Self-pay | Admitting: Internal Medicine

## 2020-07-07 ENCOUNTER — Other Ambulatory Visit: Payer: Self-pay | Admitting: Internal Medicine

## 2020-07-19 ENCOUNTER — Other Ambulatory Visit: Payer: Self-pay | Admitting: Cardiology

## 2020-08-22 ENCOUNTER — Other Ambulatory Visit (HOSPITAL_COMMUNITY): Payer: Self-pay

## 2020-08-22 ENCOUNTER — Other Ambulatory Visit: Payer: Self-pay

## 2020-08-22 ENCOUNTER — Ambulatory Visit (HOSPITAL_COMMUNITY)
Admission: RE | Admit: 2020-08-22 | Discharge: 2020-08-22 | Disposition: A | Discharge status: Home / Self Care | Payer: Managed Care, Other (non HMO) | Source: Ambulatory Visit | Attending: Internal Medicine | Admitting: Internal Medicine

## 2020-08-22 ENCOUNTER — Encounter (HOSPITAL_COMMUNITY): Payer: Self-pay | Admitting: Internal Medicine

## 2020-08-22 VITALS — BP 160/80 | HR 62 | Wt 328.0 lb

## 2020-08-22 DIAGNOSIS — I483 Typical atrial flutter: Secondary | ICD-10-CM | POA: Insufficient documentation

## 2020-08-22 DIAGNOSIS — Z86711 Personal history of pulmonary embolism: Secondary | ICD-10-CM | POA: Insufficient documentation

## 2020-08-22 DIAGNOSIS — Z8249 Family history of ischemic heart disease and other diseases of the circulatory system: Secondary | ICD-10-CM | POA: Diagnosis not present

## 2020-08-22 DIAGNOSIS — G4733 Obstructive sleep apnea (adult) (pediatric): Secondary | ICD-10-CM

## 2020-08-22 DIAGNOSIS — Z7951 Long term (current) use of inhaled steroids: Secondary | ICD-10-CM | POA: Insufficient documentation

## 2020-08-22 DIAGNOSIS — N1832 Chronic kidney disease, stage 3b: Secondary | ICD-10-CM | POA: Diagnosis not present

## 2020-08-22 DIAGNOSIS — Z7901 Long term (current) use of anticoagulants: Secondary | ICD-10-CM | POA: Diagnosis not present

## 2020-08-22 DIAGNOSIS — Z87891 Personal history of nicotine dependence: Secondary | ICD-10-CM | POA: Insufficient documentation

## 2020-08-22 DIAGNOSIS — I10 Essential (primary) hypertension: Secondary | ICD-10-CM | POA: Diagnosis not present

## 2020-08-22 DIAGNOSIS — Z79899 Other long term (current) drug therapy: Secondary | ICD-10-CM | POA: Insufficient documentation

## 2020-08-22 DIAGNOSIS — I5032 Chronic diastolic (congestive) heart failure: Secondary | ICD-10-CM | POA: Diagnosis not present

## 2020-08-22 DIAGNOSIS — Z86718 Personal history of other venous thrombosis and embolism: Secondary | ICD-10-CM | POA: Diagnosis not present

## 2020-08-22 DIAGNOSIS — I130 Hypertensive heart and chronic kidney disease with heart failure and stage 1 through stage 4 chronic kidney disease, or unspecified chronic kidney disease: Secondary | ICD-10-CM | POA: Diagnosis not present

## 2020-08-22 DIAGNOSIS — I5043 Acute on chronic combined systolic (congestive) and diastolic (congestive) heart failure: Secondary | ICD-10-CM | POA: Diagnosis present

## 2020-08-22 DIAGNOSIS — N183 Chronic kidney disease, stage 3 unspecified: Secondary | ICD-10-CM

## 2020-08-22 DIAGNOSIS — Z9989 Dependence on other enabling machines and devices: Secondary | ICD-10-CM

## 2020-08-22 DIAGNOSIS — Z6841 Body Mass Index (BMI) 40.0 and over, adult: Secondary | ICD-10-CM | POA: Diagnosis not present

## 2020-08-22 DIAGNOSIS — I480 Paroxysmal atrial fibrillation: Secondary | ICD-10-CM

## 2020-08-22 MED ORDER — DAPAGLIFLOZIN PROPANEDIOL 10 MG PO TABS: 10.0000 mg | ORAL_TABLET | Freq: Every day | ORAL | 11 refills | Status: DC

## 2020-08-22 NOTE — Progress Notes (Signed)
Advanced Heart Failure Clinic Note   Referring Physician: [name] Primary Care: Silvio Pate Primary Cardiologist: [name]  EP: Dr Rayann Heman HF MD: Dr Haroldine Laws   HPI: Phillip [name] is a 60 y.o. male hx of PAF, remote PE, hx of DVT, OSA, morbid obesity, CKD Stage III,  HTN, and  combined chronic systolic/diastolic. S/P A Flutter ablation May 2019 .    EF in 2015 40-45% Echo 12/21/16 with EF 55-60% G2DD, mild aortic regurgitation. Last nuc 2008 no ischemia, no hx of cath due to CKD.   Developed AFL and underwent AFL ablation in 5/19.  I saw him last on 10/11/18 and was feeling bad. More SOB. Having some pressure in his chest and arm. + LE edema. Found to be in AFL/AF in 80s. Echo at that time with EF back down to 30%. Underwent DC-CV on 10/14/18 and referred to AF Clinic.   Developed recurrent AF at the end of January 2021. Underwent attempt at [facility] on 04/22/19 but failed to convert. Seen back in AF Clinic on 2/5. Nota able to use Tikosyn due to long QT. Not ablation candidate due to obesity. Considered amiodarone. Started on flecainide but  had treadmill to monitor Flecainide Tx.   Patient had significant QRS widening within first minute of exercise.  Flecainide stopped  Echo 11/20 EF 45-50%  Echo 5/21 EF 45-50%  Returns for f/u. Says he feels great. Has been trying to lose weight. Walking on TM for 20 mis every morning. Also doing some walking in the neighborhood. Mild DOE. No CP. Mild LE edema. Taking torsemide 60 bid. Occasionally will take extra.     Review of systems complete and found to be negative unless listed in HPI.    Past Medical History:  Diagnosis Date  . Asthma   . CHF (congestive heart failure) ([place]) 11/15   EF 40-45%, improved with follow-up  . Chronic kidney disease   . COPD (chronic obstructive pulmonary disease) ([place])   . DVT (deep venous thrombosis) ([place])   . Gout   . Hypertension   . Hypertensive cardiovascular disease   . Kidney dysfunction   . Morbid obesity  ([place])   . OSA on CPAP   . Paroxysmal atrial fibrillation ([facility])   . PNA (pneumonia)   . Pulmonary embolism ([place]) 2010   left leg DVT  . Sleep apnea    uses cpap   Current Outpatient Medications  Medication Sig Dispense Refill  . albuterol (PROAIR HFA) 108 (90 Base) MCG/ACT inhaler Inhale 2 puffs into the lungs every 4 (four) hours as needed for wheezing or shortness of breath. 1 Inhaler 3  . albuterol (PROVENTIL) (2.5 MG/3ML) 0.083% nebulizer solution INHALE 1 VIAL VIA NEBULIZER EVERY 6 HOURS AS NEEDED FOR WHEEZING/SHORTNESS OF BREATH. 375 mL 11  . allopurinol (ZYLOPRIM) 300 MG tablet TAKE 1 TABLET BY MOUTH EVERY DAY 30 tablet 11  . carvedilol (COREG) 6.25 MG tablet TAKE 1 TABLET TWICE A DAY WITH MEALS 120 tablet 0  . Cholecalciferol (VITAMIN D3) 50 MCG (2000 UT) TABS Take 2,000 Units by mouth daily.    . colchicine 0.6 MG tablet Take 0.6 mg by mouth daily as needed (gout flares).     . hydrALAZINE (APRESOLINE) 100 MG tablet TAKE 1 TABLET THREE TIMES A DAY (CHANGE IN DOSAGE) 90 tablet 0  . KLOR-CON M20 20 MEQ tablet TAKE 2 TABLETS (40 MEQ) EVERY MORNING AND 1 TABLET (20 MEQ) EVERY EVENING 270 tablet 3  . montelukast (SINGULAIR) 10 MG tablet TAKE 1 TABLET  BY MOUTH EVERYDAY AT BEDTIME 30 tablet 0  . naproxen sodium (ALEVE) 220 MG tablet Take 440 mg by mouth 2 (two) times daily as needed (knee pain.).    [name] oxymetazoline (AFRIN NASAL SPRAY) 0.05 % nasal spray Place 1 spray into both nostrils 2 (two) times daily as needed for congestion.    . rivaroxaban (XARELTO) 20 MG TABS tablet Take 1 tablet (20 mg total) by mouth daily with supper. Needs appt for further refills 90 tablet 0  . SYMBICORT 80-4.5 MCG/ACT inhaler USE 2 INHALATIONS TWICE A DAY (NEED APPOINTMENT) 30.6 g 3  . torsemide (DEMADEX) 20 MG tablet TAKE 3 TABLETS TWICE A DAY 540 tablet 3  . UNABLE TO FIND Med Name: CPAP with sleep     No current facility-administered medications for this encounter.   Allergies  Allergen Reactions  .  Other Anaphylaxis    mushrooms    Social History   Socioeconomic History  . Marital status: Divorced    Spouse name: Not on file  . Number of children: 2  . Years of education: 31  . Highest education level: Not on file  Occupational History  . Occupation: Drives dump truck    Comment:    . Occupation: Airline pilot    Comment: Disabled  Tobacco Use  . Smoking status: Former Smoker    Packs/day: 0.25    Years: 1.00    Pack years: 0.25    Types: Cigarettes    Quit date: 07/23/1989    Years since quitting: 31.1  . Smokeless tobacco: Never Used  Vaping Use  . Vaping Use: Never used  Substance and Sexual Activity  . Alcohol use: No    Alcohol/week: 0.0 standard drinks  . Drug use: No  . Sexual activity: Not Currently  Other Topics Concern  . Not on file  Social History Narrative   Pt lives in [place], alone.   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   Has 2 sons in [place]      No living will   Would want sons to make decisions for him   Would accept resuscitation   Social Determinants of Health   Financial Resource Strain: Not on file  Food Insecurity: Not on file  Transportation Needs: Not on file  Physical Activity: Not on file  Stress: Not on file  Social Connections: Not on file  Intimate Partner Violence: Not on file    Family History  Problem Relation Age of Onset  . Hypertension Mother   . Diabetes Father   . CAD Father   . Clotting disorder Father   . Heart disease Father        transplant  . Stomach cancer Father   . Allergies Brother   . Colon cancer Neg Hx   . Colon polyps Neg Hx   . Esophageal cancer Neg Hx   . Rectal cancer Neg Hx    Vitals:   08/22/20 1455  BP: (!) 160/80  Pulse: 62  SpO2: 98%  Weight: (!) 148.8 kg (328 lb)   Wt Readings from Last 3 Encounters:  08/22/20 (!) 148.8 kg (328 lb)  03/09/20 (!) 146.5 kg (323 lb)  12/20/19 (!) 148.8 kg (328 lb)   PHYSICAL EXAM: General:  Well  appearing. No resp difficulty HEENT: normal Neck: supple. no JVD. Carotids 2+ bilat; no bruits. No lymphadenopathy or thryomegaly appreciated. Cor: PMI nondisplaced. Regular rate & rhythm. No rubs, gallops or murmurs. Lungs: clear Abdomen:  markedly obese soft, nontender, nondistended. No hepatosplenomegaly. No bruits or masses. Good bowel sounds. Extremities: no cyanosis, clubbing, rash, 1+ edema Neuro: alert & orientedx3, cranial nerves grossly intact. moves all 4 extremities w/o difficulty. Affect pleasant   ECG NSR 63 LVH Mild non-specific TWI  + PACs Personally reviewed   ASSESSMENT & PLAN:  1. Acute on chronic systolic/diastolic HF - Echo 07/1759 LVEF 55-60%, Grade 2 DD. - EF 7/20 back down to 30%. Suspect due to AF but rate not overly elevated. HTN may also be playing a role. - Symptoms improved after DC-CV on 10/11/18  - Echo 11/20 EF 45-50% - Echo 5/21 EF 45-50% - Doing well now that he is back in NSR - Stable NYHA II-III - Volume status mildly elevated - Continue torsemide 60 bid  - Continue hydralazine 100 tid and carvedilol 6.25 bid. - Start Farxiga 10mg  daily - Has not had cath due to CKD and lack of clear anginal symptoms.  - No ACE/ARB with CKD (creatinine has run 2.5-2.7)  2. PAF / Typical A-flutter, paroxysmal - S/P AFL ablation 07/2017. - s/p DC-CV of AFib on 10/14/18 and 05/10/19 - no further AF - Continue Xarelto 20 mg daily. No bleeding - He is compliant with CPAP. Needs further weight loss - Has been seen in AF Clinic and felt not to be candidate for RFA due to size . - Not candidate for Tikosyn with CKD. Failed flecainide with QT prolongation. Amio would be only option if needed  3. Hypertension - BP is always  high here but usually in 120-130 range at home but hasn't taken it lately . - Encouraged him to resume tracking home BPs again. Let us know if systolic is consistently > 140   4. Morbid obesity - Body mass index is 50.61 kg/m.  - continue weight  loss efforts  5. CKD IIIb - Creatinine baseline ~2.5 -2.7 - Follows with Dr. Candiss Norse. Saw him in 3/22 and creatinine was stable - Start [place] 10mg  daily  6. OSA on CPAP - compliant with CPAP  - continue with weight loss    Glori Bickers, MD  3:27 PM

## 2020-08-22 NOTE — Patient Instructions (Signed)
EKG done today.  No Labs done today.  START Farxiga 10mg  (1 tablet) by mouth daily.   No other medication changes were made. Please continue all current medications as prescribed.  Your physician recommends that you schedule a follow-up appointment in: 6 months. Please contact our office in November for a December appointment.    If you have any questions or concerns before your next appointment please send [place] a message through [place] or call our office at [phone].    TO LEAVE A MESSAGE FOR THE NURSE SELECT OPTION 2, PLEASE LEAVE A MESSAGE INCLUDING: . YOUR NAME . DATE OF BIRTH . CALL BACK NUMBER . REASON FOR CALL**this is important as we prioritize the call backs  YOU WILL RECEIVE A CALL BACK THE SAME DAY AS LONG AS YOU CALL BEFORE 4:00 PM   Do the following things EVERYDAY: 1) Weigh yourself in the morning before breakfast. Write it down and keep it in a log. 2) Take your medicines as prescribed 3) Eat low salt foods--Limit salt (sodium) to 2000 mg per day.  4) Stay as active as you can everyday 5) Limit all fluids for the day to less than 2 liters   At the [facility], you and your health needs are our priority. As part of our continuing mission to provide you with exceptional heart care, we have created designated Provider Care Teams. These Care Teams include your primary Cardiologist (physician) and Advanced Practice Providers (APPs- Physician Assistants and Nurse Practitioners) who all work together to provide you with the care you need, when you need it.   You may see any of the following providers on your designated Care Team at your next follow up: [name] Dr Glori Bickers . Dr Loralie Champagne . Darrick Grinder, NP . Lyda Jester, PA . Audry Riles, PharmD   Please be sure to bring in all your medications bottles to every appointment.

## 2020-08-22 NOTE — Addendum Note (Signed)
Encounter addended by: Shonna Chock, CMA on: 11/23/6943 3:45 PM  Actions taken: Pharmacy for encounter modified, Order list changed

## 2020-08-23 ENCOUNTER — Telehealth (HOSPITAL_COMMUNITY): Payer: Self-pay | Admitting: Pharmacy Technician

## 2020-08-23 ENCOUNTER — Other Ambulatory Visit (HOSPITAL_COMMUNITY): Payer: Self-pay

## 2020-08-23 NOTE — Telephone Encounter (Addendum)
Advanced Heart Failure Patient Advocate Encounter  Prior Authorization for Wilder [place]ade has been submmited approved by [employer].  PA#  [id number] Effective dates: 08/23/20 through 08/23/21  Patients co-pay is $45 (30 day billing)  Patient has Pharmacist, community and can also use a copay card, bringing the 30 day co-pay to $0. Provided billing information to patient's pharmacy.  BIN [id number] PCN CN ID [id number] GROUP [id number]   Charlann Boxer, CPhT

## 2020-09-17 ENCOUNTER — Other Ambulatory Visit: Payer: Self-pay | Admitting: Internal Medicine

## 2020-09-21 ENCOUNTER — Other Ambulatory Visit: Payer: Self-pay | Admitting: Internal Medicine

## 2020-10-22 IMAGING — CR CHEST - 2 VIEW
2 series · 2 of 2 positions shown · non-contrast
Comparison: Prior radiograph from 04/30/2018.

CLINICAL DATA: Initial evaluation for acute shortness of breath.

EXAM:
CHEST - 2 VIEW

[w chest pa]
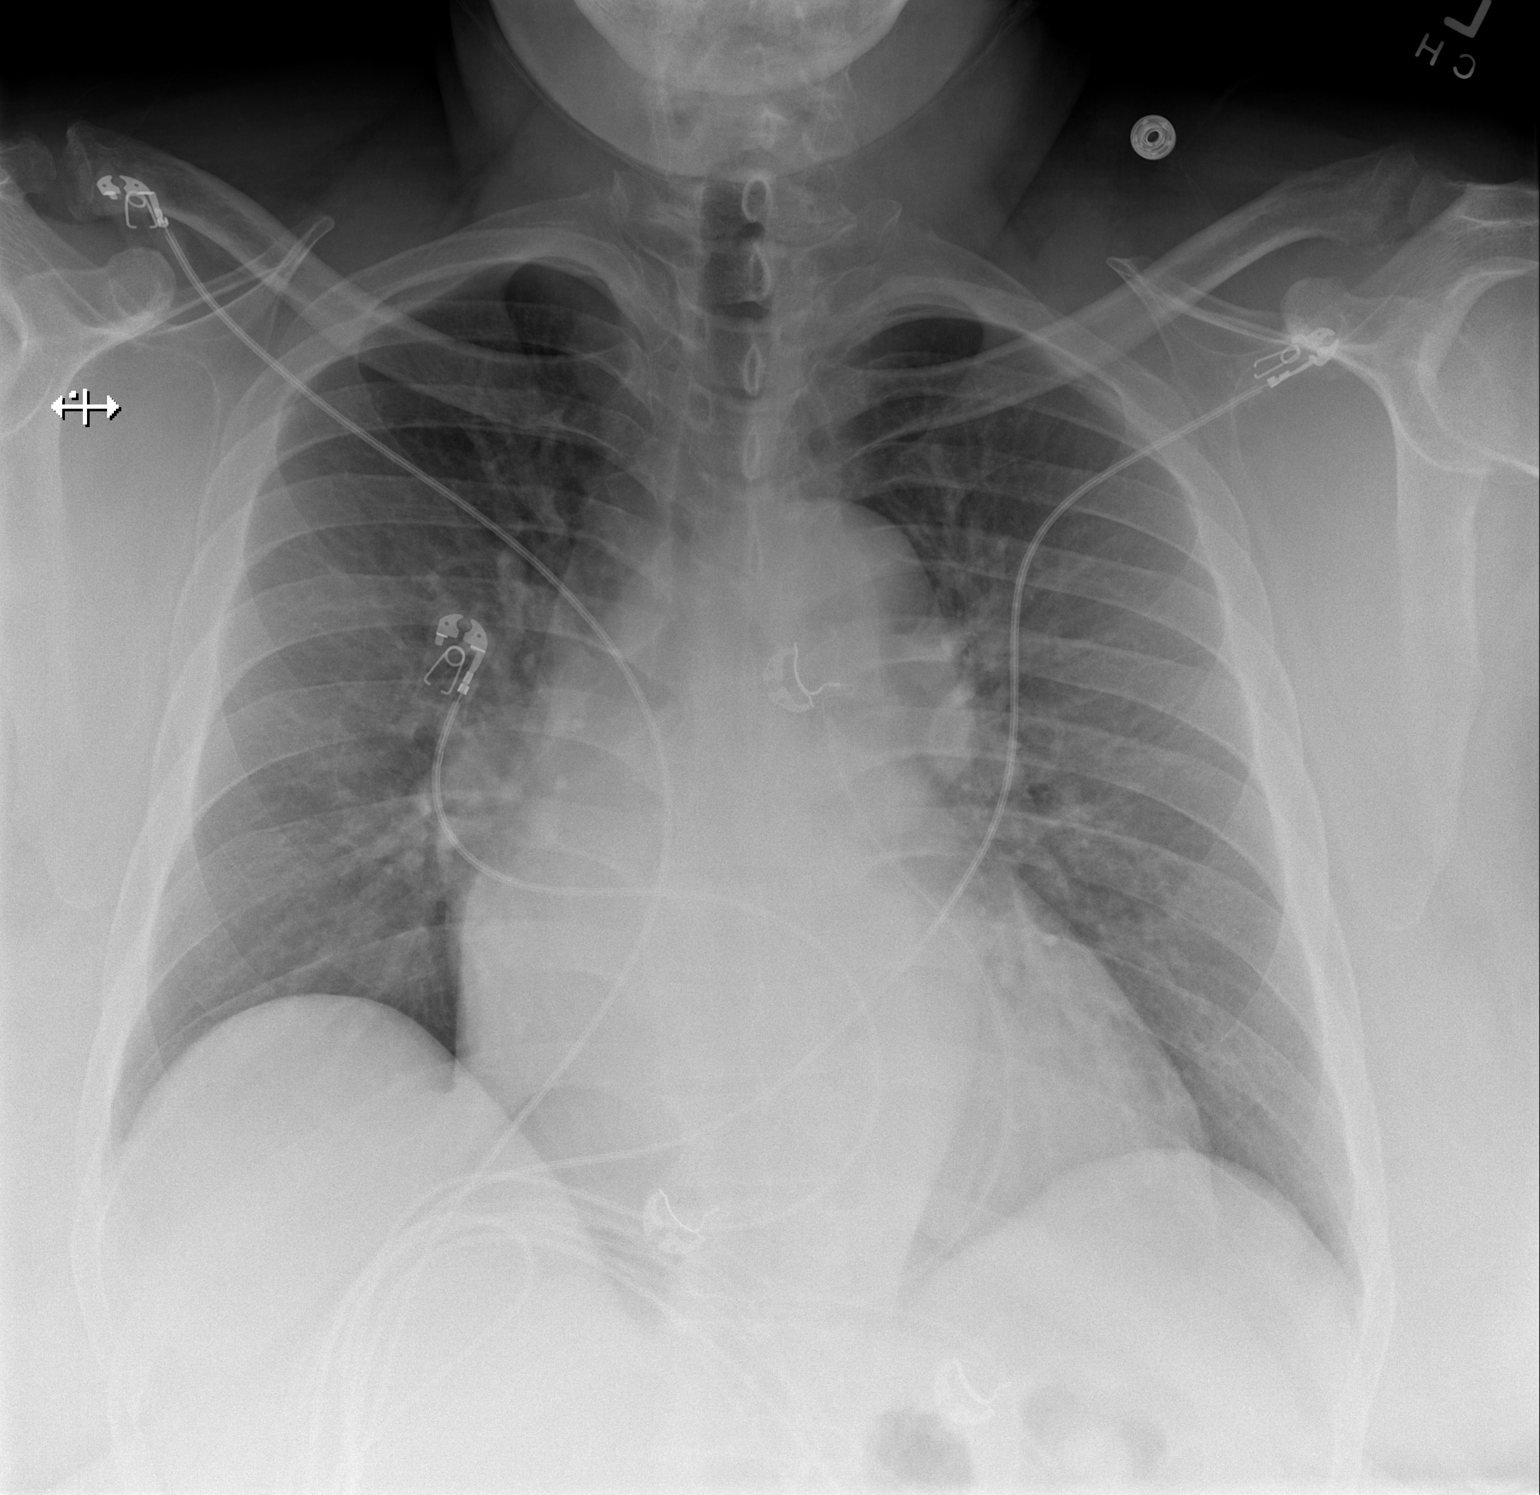

[w chest lat]
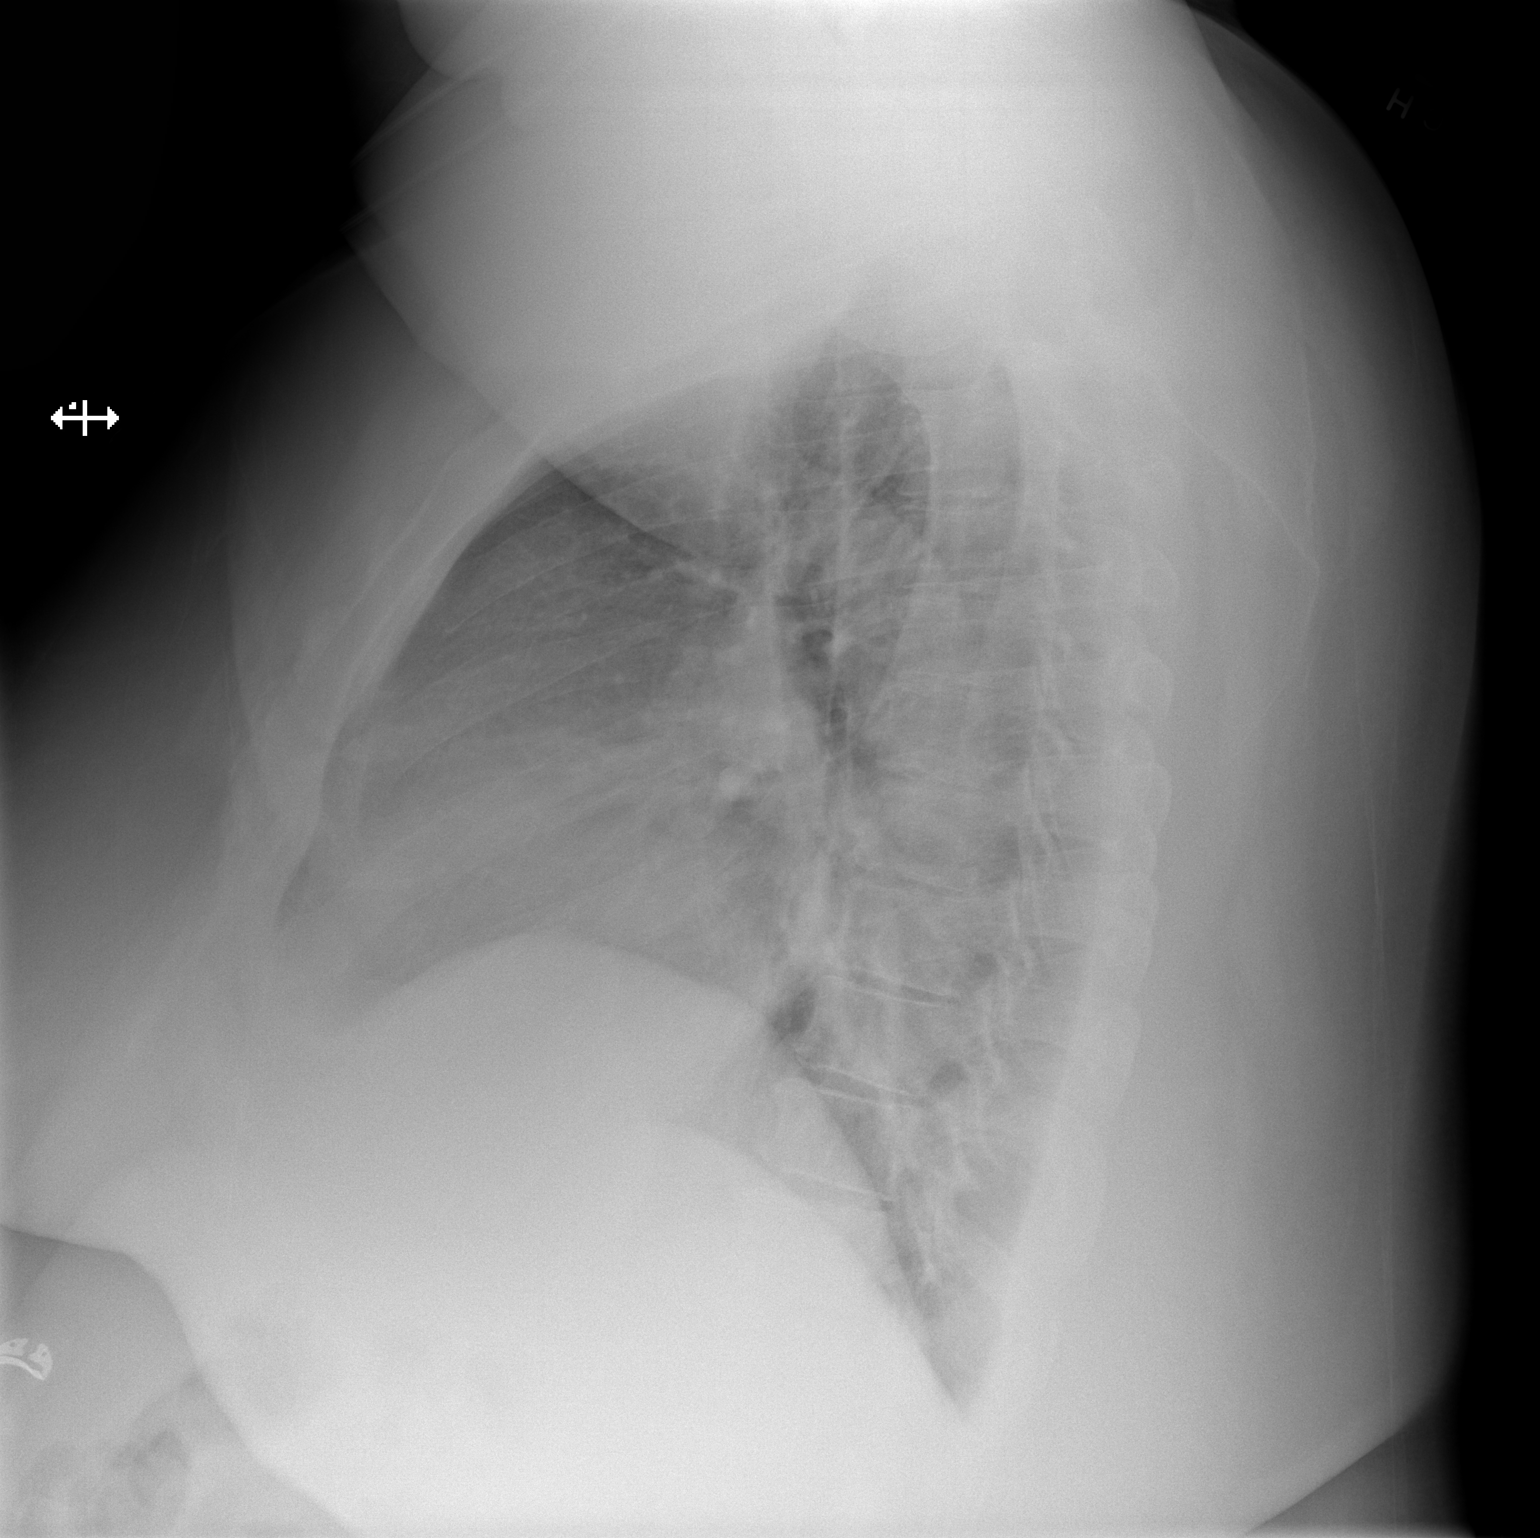

[2 of 2 positions shown; findings below may reference images not displayed]

FINDINGS: Cardiomegaly, stable. Mediastinal silhouette within normal limits.

Lungs mildly hypoinflated. Central perihilar vascular congestion
without overt pulmonary edema. No visible pleural effusion. No
consolidative airspace disease. No pneumothorax.

No acute osseous finding.
IMPRESSION: 1. Cardiomegaly with mild perihilar vascular congestion without
overt pulmonary edema.
2. No other active cardiopulmonary disease.

## 2020-11-12 ENCOUNTER — Telehealth: Payer: Self-pay | Admitting: Internal Medicine

## 2020-11-12 NOTE — Telephone Encounter (Signed)
ATC patient unable to reach Patient has not been seen since 2020 needs appointment for refills

## 2020-11-13 MED ORDER — BUDESONIDE-FORMOTEROL FUMARATE 80-4.5 MCG/ACT IN AERO: 2.0000 | INHALATION_SPRAY | Freq: Two times a day (BID) | RESPIRATORY_TRACT | 0 refills | Status: DC

## 2020-11-13 NOTE — Telephone Encounter (Signed)
Called and spoke with patient, advised that he has not been seen in our office since 2020 and would need an office visit for refills.  He states he is going out of town today for a funeral and is out of his symbicort.  Advised that I can get him scheduled with Dr. Melvyn Novas or an app and once that is scheduled I can send a script to [facility] for him to pick up and then he can get further refills after his visit.  Scheduled him for 91/ with BW at 11:30 am, advised to arrive by 11:15 am for check in.    As I was sending in script for Symbicort 80, received a message that it is not on his formulary for [place].  Called [facility] to see if his insurance had changed.  They did not have the ID on file that we had in our system.  She ran the Symbicort under current insurance and said it went through and it would be a $50 copay.  Script sent to [facility].  Nothing further needed.

## 2020-11-21 ENCOUNTER — Emergency Department (HOSPITAL_COMMUNITY): Payer: Managed Care, Other (non HMO)

## 2020-11-21 ENCOUNTER — Inpatient Hospital Stay (HOSPITAL_COMMUNITY)
Admission: EM | Admit: 2020-11-21 | Discharge: 2020-11-24 | DRG: 291 | Disposition: A | Discharge status: Home / Self Care | Payer: Managed Care, Other (non HMO) | Attending: Cardiovascular Disease | Admitting: Cardiovascular Disease

## 2020-11-21 ENCOUNTER — Encounter (HOSPITAL_COMMUNITY): Payer: Self-pay

## 2020-11-21 ENCOUNTER — Other Ambulatory Visit: Payer: Self-pay

## 2020-11-21 DIAGNOSIS — I509 Heart failure, unspecified: Secondary | ICD-10-CM | POA: Diagnosis not present

## 2020-11-21 DIAGNOSIS — I480 Paroxysmal atrial fibrillation: Secondary | ICD-10-CM | POA: Diagnosis present

## 2020-11-21 DIAGNOSIS — I10 Essential (primary) hypertension: Secondary | ICD-10-CM | POA: Diagnosis present

## 2020-11-21 DIAGNOSIS — Z87891 Personal history of nicotine dependence: Secondary | ICD-10-CM

## 2020-11-21 DIAGNOSIS — Z86718 Personal history of other venous thrombosis and embolism: Secondary | ICD-10-CM

## 2020-11-21 DIAGNOSIS — Z833 Family history of diabetes mellitus: Secondary | ICD-10-CM

## 2020-11-21 DIAGNOSIS — G4733 Obstructive sleep apnea (adult) (pediatric): Secondary | ICD-10-CM

## 2020-11-21 DIAGNOSIS — I130 Hypertensive heart and chronic kidney disease with heart failure and stage 1 through stage 4 chronic kidney disease, or unspecified chronic kidney disease: Secondary | ICD-10-CM | POA: Diagnosis not present

## 2020-11-21 DIAGNOSIS — Z20822 Contact with and (suspected) exposure to covid-19: Secondary | ICD-10-CM | POA: Diagnosis present

## 2020-11-21 DIAGNOSIS — I4819 Other persistent atrial fibrillation: Secondary | ICD-10-CM | POA: Diagnosis present

## 2020-11-21 DIAGNOSIS — Z6841 Body Mass Index (BMI) 40.0 and over, adult: Secondary | ICD-10-CM

## 2020-11-21 DIAGNOSIS — I82409 Acute embolism and thrombosis of unspecified deep veins of unspecified lower extremity: Secondary | ICD-10-CM | POA: Diagnosis present

## 2020-11-21 DIAGNOSIS — Z832 Family history of diseases of the blood and blood-forming organs and certain disorders involving the immune mechanism: Secondary | ICD-10-CM

## 2020-11-21 DIAGNOSIS — Z8249 Family history of ischemic heart disease and other diseases of the circulatory system: Secondary | ICD-10-CM

## 2020-11-21 DIAGNOSIS — N184 Chronic kidney disease, stage 4 (severe): Secondary | ICD-10-CM | POA: Diagnosis present

## 2020-11-21 DIAGNOSIS — Z79899 Other long term (current) drug therapy: Secondary | ICD-10-CM

## 2020-11-21 DIAGNOSIS — Z7951 Long term (current) use of inhaled steroids: Secondary | ICD-10-CM

## 2020-11-21 DIAGNOSIS — M109 Gout, unspecified: Secondary | ICD-10-CM | POA: Diagnosis present

## 2020-11-21 DIAGNOSIS — Z86711 Personal history of pulmonary embolism: Secondary | ICD-10-CM

## 2020-11-21 DIAGNOSIS — I5043 Acute on chronic combined systolic (congestive) and diastolic (congestive) heart failure: Secondary | ICD-10-CM | POA: Insufficient documentation

## 2020-11-21 DIAGNOSIS — Z7901 Long term (current) use of anticoagulants: Secondary | ICD-10-CM

## 2020-11-21 DIAGNOSIS — J449 Chronic obstructive pulmonary disease, unspecified: Secondary | ICD-10-CM | POA: Diagnosis present

## 2020-11-21 DIAGNOSIS — I4891 Unspecified atrial fibrillation: Secondary | ICD-10-CM | POA: Insufficient documentation

## 2020-11-21 DIAGNOSIS — Z800 Family history of malignant neoplasm of digestive organs: Secondary | ICD-10-CM

## 2020-11-21 LAB — BASIC METABOLIC PANEL
Anion gap: 8 (ref 5–15)
BUN: 33 mg/dL — ABNORMAL HIGH (ref 6–20)
CO2: 27 mmol/L (ref 22–32)
Calcium: 9.4 mg/dL (ref 8.9–10.3)
Chloride: 106 mmol/L (ref 98–111)
Creatinine, Ser: 2.61 mg/dL — ABNORMAL HIGH (ref 0.61–1.24)
GFR, Estimated: 28 mL/min — ABNORMAL LOW (ref >60)
Glucose, Bld: 96 mg/dL (ref 70–99)
Potassium: 3.8 mmol/L (ref 3.5–5.1)
Sodium: 141 mmol/L (ref 135–145)

## 2020-11-21 LAB — CBC
HCT: 37.7 % — ABNORMAL LOW (ref 39.0–52.0)
Hemoglobin: 11.5 g/dL — ABNORMAL LOW (ref 13.0–17.0)
MCH: 28.4 pg (ref 26.0–34.0)
MCHC: 30.5 g/dL (ref 30.0–36.0)
MCV: 93.1 fL (ref 80.0–100.0)
Platelets: 165 10*3/uL (ref 150–400)
RBC: 4.05 MIL/uL — ABNORMAL LOW (ref 4.22–5.81)
RDW: 15.9 % — ABNORMAL HIGH (ref 11.5–15.5)
WBC: 4.1 10*3/uL (ref 4.0–10.5)
nRBC: 0 % (ref 0.0–0.2)

## 2020-11-21 LAB — BRAIN NATRIURETIC PEPTIDE: B Natriuretic Peptide: 949 pg/mL — ABNORMAL HIGH (ref 0.0–100.0)

## 2020-11-21 LAB — TROPONIN I (HIGH SENSITIVITY)
Troponin I (High Sensitivity): 53 ng/L — ABNORMAL HIGH (ref <18)
Troponin I (High Sensitivity): 59 ng/L — ABNORMAL HIGH (ref <18)

## 2020-11-21 LAB — D-DIMER, QUANTITATIVE: D-Dimer, Quant: 0.45 ug/mL-FEU (ref 0.00–0.50)

## 2020-11-21 NOTE — ED Triage Notes (Signed)
Shortness of breath, bilateral leg swelling and abdominal distention x past few days. Reports weight gain of 4lbs in just 2 days.   Also reports of non productive cough.hx of PE and CHF. Compliant with meds.

## 2020-11-21 NOTE — ED Notes (Signed)
Pt ambulated with no assistance. Pt O2 stayed above 95% with a HR of 133 and RR of 33

## 2020-11-21 NOTE — ED Provider Notes (Signed)
[place] EMERGENCY DEPARTMENT Provider Note   CSN: [id number] Arrival date & time: 11/21/20  2010     History Chief Complaint  Patient presents with   Shortness of Breath    Phillip [name] is a 60 y.o. male.  Past medical history of congestive heart failure, CKD, COPD on CPAP, hypertension, paroxysmal A. Fib anticoagulated on Xarelto, previous PE presents emergency department with 2 days of gradually increasing shortness of breath and palpitations.  States that he is normally able to work and ambulate around the house without difficulty; however, has noted increasing shortness of breath and fatigue when walking from the kitchen to the living room.  Additionally he took his weight at home and has noted a 4 pound weight gain over the past 2 days.  Endorses is that his abdomen has felt tight and has noted some bilateral lower extremity edema.  States that his dry weight is 313 and this morning was 317.8.  States that he normally takes torsemide 60 mg twice daily and took an additional 60 mg without improvement in symptoms.  Denies dizziness, lightheadedness or syncope.  Denies chest pain or abdominal pain, diaphoresis, nausea or vomiting, unilateral lower extremity swelling, recent travel, sick contacts.  Denies missing any doses of his Xarelto.  On further questioning and review of chart he has had atrial fibrillation since 2018.  States that he had an ablation in 2019.  Had resolution of atrial fibrillation which recurred in 09/2018 with repeat EF at 30%.  He underwent an synchronized cardioversion on 10/14/2018.  Again had atrial fibrillation at the end of January 2021 and underwent another cardioversion which was unsuccessful.  At repeat cardioversion 05/10/2019 with no further atrial fibrillation.  Last echo was 08/12/2019 with a EF of 45 to 50%.  Shortness of Breath Associated symptoms: no chest pain and no cough       Past Medical History:  Diagnosis Date   Asthma    CHF  (congestive heart failure) ([place]) 11/15   EF 40-45%, improved with follow-up   Chronic kidney disease    COPD (chronic obstructive pulmonary disease) (HCC)    DVT (deep venous thrombosis) (HCC)    Gout    Hypertension    Hypertensive cardiovascular disease    Kidney dysfunction    Morbid obesity (HCC)    OSA on CPAP    Paroxysmal atrial fibrillation (HCC)    PNA (pneumonia)    Pulmonary embolism ([place]) 2010   left leg DVT   Sleep apnea    uses cpap    Patient Active Problem List   Diagnosis Date Noted   Preventative health care 03/09/2020   Benign neoplasm of ascending colon    Hyperlipidemia LDL goal <70 05/15/2019   Atrial flutter ([place]) 06/22/2017   Acute rhinitis 03/10/2017   Asthma with COPD ([place])    Chronic diastolic heart failure ([place])    Anticoagulated 08/17/2015   Hx pulmonary embolism 08/17/2015   Deep vein thrombosis (DVT) of lower extremity ([place]) 08/17/2015   Paroxysmal atrial fibrillation ([place]) 08/04/2015   SVT (supraventricular tachycardia) ([place]) 06/22/2015   Morbid obesity ([place]) 03/07/2015   Essential hypertension    Gout    Mild persistent asthma in adult without complication 77/93/9030   OSA on CPAP 05/06/2014   Chronic kidney disease, stage IV (severe) ([place]) 05/06/2014    Past Surgical History:  Procedure Laterality Date   A-FLUTTER ABLATION N/A 07/22/2017   Procedure: A-FLUTTER ABLATION;  Surgeon: Thompson Grayer, MD;  Location: [facility]  INVASIVE CV LAB;  Service: Cardiovascular;  Laterality: N/A;   CARDIOVERSION N/A 10/14/2018   Procedure: CARDIOVERSION;  Surgeon: Jolaine Artist, MD;  Location: [facility] ENDOSCOPY;  Service: Cardiovascular;  Laterality: N/A;   CARDIOVERSION N/A 04/22/2019   Procedure: CARDIOVERSION;  Surgeon: Thayer Headings, MD;  Location: [facility] ENDOSCOPY;  Service: Cardiovascular;  Laterality: N/A;   CARDIOVERSION N/A 05/10/2019   Procedure: CARDIOVERSION;  Surgeon: Jolaine Artist, MD;  Location: [facility] ENDOSCOPY;  Service: Cardiovascular;   Laterality: N/A;   COLONOSCOPY WITH PROPOFOL N/A 12/20/2019   Procedure: COLONOSCOPY WITH PROPOFOL;  Surgeon: Yetta Flock, MD;  Location: [facility] ENDOSCOPY;  Service: Gastroenterology;  Laterality: N/A;   POLYPECTOMY  12/20/2019   Procedure: POLYPECTOMY;  Surgeon: Yetta Flock, MD;  Location: [facility] ENDOSCOPY;  Service: Gastroenterology;;   UMBILICAL HERNIA REPAIR  1992    Family History  Problem Relation Age of Onset   Hypertension Mother    Diabetes Father    CAD Father    Clotting disorder Father    Heart disease Father        transplant   Stomach cancer Father    Allergies Brother    Colon cancer Neg Hx    Colon polyps Neg Hx    Esophageal cancer Neg Hx    Rectal cancer Neg Hx    Social History   Tobacco Use   Smoking status: Former    Packs/day: 0.25    Years: 1.00    Pack years: 0.25    Types: Cigarettes    Quit date: 07/23/1989    Years since quitting: 31.3   Smokeless tobacco: Never  Vaping Use   Vaping Use: Never used  Substance Use Topics   Alcohol use: No    Alcohol/week: 0.0 standard drinks   Drug use: No   Home Medications Prior to Admission medications   Medication Sig Start Date End Date Taking? Authorizing Provider  albuterol (PROAIR HFA) 108 (90 Base) MCG/ACT inhaler Inhale 2 puffs into the lungs every 4 (four) hours as needed for wheezing or shortness of breath. 05/14/18   Tanda Rockers, MD  albuterol (PROVENTIL) (2.5 MG/3ML) 0.083% nebulizer solution INHALE 1 VIAL VIA NEBULIZER EVERY 6 HOURS AS NEEDED FOR WHEEZING/SHORTNESS OF BREATH. 05/13/19   [name] I, MD  allopurinol (ZYLOPRIM) 300 MG tablet TAKE 1 TABLET BY MOUTH EVERY DAY 01/05/20   [name], MD  budesonide-formoterol (SYMBICORT) 80-4.5 MCG/ACT inhaler Inhale 2 puffs into the lungs 2 (two) times daily. 11/13/20   Tanda Rockers, MD  carvedilol (COREG) 6.25 MG tablet TAKE 1 TABLET TWICE A DAY WITH MEALS 09/18/20   Bensimhon, Shaune Pascal, MD  Cholecalciferol (VITAMIN D3) 50 MCG  (2000 UT) TABS Take 2,000 Units by mouth daily.    [provider]  colchicine 0.6 MG tablet Take 0.6 mg by mouth daily as needed (gout flares).  07/25/16   [provider]  dapagliflozin propanediol (FARXIGA) 10 MG TABS tablet Take 1 tablet (10 mg total) by mouth daily before breakfast. 08/22/20   Bensimhon, Shaune Pascal, MD  hydrALAZINE (APRESOLINE) 100 MG tablet TAKE 1 TABLET THREE TIMES A DAY (CHANGE IN DOSAGE) 06/04/20   Bensimhon, Shaune Pascal, MD  KLOR-CON M20 20 MEQ tablet TAKE 2 TABLETS (40 MEQ) EVERY MORNING AND 1 TABLET (20 MEQ) EVERY EVENING 01/30/20   Clegg, Amy D, NP  montelukast (SINGULAIR) 10 MG tablet TAKE 1 TABLET BY MOUTH EVERYDAY AT BEDTIME 10/12/19   Tanda Rockers, MD  naproxen sodium (ALEVE)  220 MG tablet Take 440 mg by mouth 2 (two) times daily as needed (knee pain.).    [provider]  oxymetazoline (AFRIN NASAL SPRAY) 0.05 % nasal spray Place 1 spray into both nostrils 2 (two) times daily as needed for congestion. 03/12/17   Oswald Hillock, MD  torsemide (DEMADEX) 20 MG tablet TAKE 3 TABLETS TWICE A DAY 01/11/20   Bensimhon, Shaune Pascal, MD  UNABLE TO FIND Med Name: CPAP with sleep    [provider]  XARELTO 20 MG TABS tablet TAKE 1 TABLET DAILY WITH SUPPER (NEEDS APPOINTMENT FOR FURTHER REFILLS) 09/21/20   Bensimhon, Shaune Pascal, MD   Allergies    Other  Review of Systems   Review of Systems  Constitutional:  Positive for fatigue.  Respiratory:  Positive for chest tightness and shortness of breath. Negative for cough.   Cardiovascular:  Positive for palpitations and leg swelling. Negative for chest pain.  Gastrointestinal:  Positive for abdominal distention. Negative for nausea.  Neurological:  Negative for dizziness, syncope and light-headedness.  All other systems reviewed and are negative.  Physical Exam Updated Vital Signs BP (!) 146/92   Pulse 80   Temp 99 F (37.2 C) (Oral)   Resp 20   Ht 5\' 7"  (1.702 m)   Wt (!) 143.8 kg   SpO2 97%    BMI 49.65 kg/m   Physical Exam Constitutional:      General: He is not in acute distress.    Appearance: He is obese.  HENT:     Head: Normocephalic and atraumatic.  Eyes:     Extraocular Movements: Extraocular movements intact.     Pupils: Pupils are equal, round, and reactive to light.  Neck:     Vascular: No hepatojugular reflux or JVD.  Cardiovascular:     Rate and Rhythm: Normal rate. Rhythm irregularly irregular.     Pulses: Normal pulses.          Radial pulses are 2+ on the right side and 2+ on the left side.       Dorsalis pedis pulses are 2+ on the right side and 2+ on the left side.     Heart sounds: No murmur heard. Pulmonary:     Effort: Pulmonary effort is normal. No tachypnea or respiratory distress.     Breath sounds: Examination of the right-lower field reveals decreased breath sounds. Examination of the left-lower field reveals decreased breath sounds. Decreased breath sounds present.  Abdominal:     General: Bowel sounds are normal.     Palpations: Abdomen is soft. There is no hepatomegaly.  Musculoskeletal:     Right lower leg: 1+ Edema present.     Left lower leg: 1+ Edema present.  Skin:    General: Skin is warm and dry.     Capillary Refill: Capillary refill takes less than 2 seconds.  Neurological:     General: No focal deficit present.     Mental Status: He is alert and oriented to person, place, and time.  Psychiatric:        Mood and Affect: Mood normal.        Behavior: Behavior normal.    ED Results / Procedures / Treatments   Labs (all labs ordered are listed, but only abnormal results are displayed) Labs Reviewed  BASIC METABOLIC PANEL - Abnormal; Notable for the following components:      Result Value   BUN 33 (*)    Creatinine, Ser 2.61 (*)  GFR, Estimated 28 (*)    All other components within normal limits  CBC - Abnormal; Notable for the following components:   RBC 4.05 (*)    Hemoglobin 11.5 (*)    HCT 37.7 (*)    RDW 15.9  (*)    All other components within normal limits  BRAIN NATRIURETIC PEPTIDE - Abnormal; Notable for the following components:   B Natriuretic Peptide 949.0 (*)    All other components within normal limits  TROPONIN I (HIGH SENSITIVITY) - Abnormal; Notable for the following components:   Troponin I (High Sensitivity) 53 (*)    All other components within normal limits  TROPONIN I (HIGH SENSITIVITY) - Abnormal; Notable for the following components:   Troponin I (High Sensitivity) 59 (*)    All other components within normal limits  D-DIMER, QUANTITATIVE  HIV ANTIBODY (ROUTINE TESTING W REFLEX)   EKG None  Radiology DG Chest 2 View  Result Date: 11/21/2020 CLINICAL DATA:  Shortness of breath and chest tightness. EXAM: CHEST - 2 VIEW COMPARISON:  May 13, 2019 FINDINGS: There is no evidence of acute infiltrate, pleural effusion or pneumothorax. Mild atelectasis is seen within the left lung base. The cardiac silhouette is mildly enlarged. The visualized skeletal structures are unremarkable. IMPRESSION: Stable cardiomegaly with mild left basilar atelectasis. Electronically Signed   By: Virgina Norfolk M.D.   On: 11/21/2020 20:55    Procedures Procedures   Medications Ordered in ED Medications  allopurinol (ZYLOPRIM) tablet 300 mg (has no administration in time range)  carvedilol (COREG) tablet 6.25 mg (has no administration in time range)  hydrALAZINE (APRESOLINE) tablet 100 mg (has no administration in time range)  dapagliflozin propanediol (FARXIGA) tablet 10 mg (has no administration in time range)  rivaroxaban (XARELTO) tablet 20 mg (has no administration in time range)  cholecalciferol (VITAMIN D3) tablet 2,000 Units (has no administration in time range)  potassium chloride SA (KLOR-CON) CR tablet 40 mEq (has no administration in time range)  mometasone-formoterol (DULERA) 100-5 MCG/ACT inhaler 2 puff (has no administration in time range)  sodium chloride flush (NS) 0.9 %  injection 3 mL (has no administration in time range)  sodium chloride flush (NS) 0.9 % injection 3 mL (has no administration in time range)  0.9 %  sodium chloride infusion (has no administration in time range)  acetaminophen (TYLENOL) tablet 650 mg (has no administration in time range)  ondansetron (ZOFRAN) injection 4 mg (has no administration in time range)  furosemide (LASIX) 100 mg in dextrose 5 % 50 mL IVPB (has no administration in time range)    ED Course  I have reviewed the triage vital signs and the nursing notes.  Pertinent labs & imaging results that were available during my care of the patient were reviewed by me and considered in my medical decision making (see chart for details).  2347: Spoke with Dr. Blossom Hoops with cardiology - coming to see him  MDM Rules/Calculators/A&P 60yo male presents emergency department for 2 days of increasing shortness of breath and palpitations.  EKG shows recurrence of atrial fibrillation. Initially was in A. fib with RVR with a heart rate in the 120s.  Appears rate controlled now at bedside with heart rate in the 80s. Troponins 53, 59 likely cardiac stress related.  Do not believe it to be ACS. Shortness of breath is likely due to recurrence of A. fib, however I do believe there is a concomitant CHF exacerbation given BNP of 949. His presentation is not consistent with acute  respiratory etiologies including PE.  Chest x-ray without evidence of pneumothorax or infectious etiology such as pneumonia.  Chest x-ray showed mild atelectasis in the left lung base.  On physical exam the patient is in no respiratory distress.  He was ambulated and O2 sats are maintained greater than 95%.  He does not appear to be overtly fluid overloaded needing diuresis at this time. Discussed with Dr. Cline Cools use of chemical conversion with diltiazem or amiodarone.  However patient is in heart failure so we will not use diltiazem.  QTC on EKG was 521 so will not give  amiodarone. Cardiology consulted for admission.  Patient is aware that he will be admitted.  Final Clinical Impression(s) / ED Diagnoses Final diagnoses:  Atrial fibrillation and flutter [facility])    Rx / DC Orders ED Discharge Orders     None        Mickie Hillier, PA-C 11/22/20 0219    Fatima Blank, MD 11/22/20 [phone]

## 2020-11-21 NOTE — ED Provider Notes (Signed)
Emergency Medicine Provider Triage Evaluation Note  Phillip [name] , a 60 y.o. male  was evaluated in triage.  Pt complains of shortness of breath, chest tightness. He has heart failure, kidney disease, dm. He takes a fluid pill normally. Patient reports he was increasing his fluid intake because of recent difficulties with kidneys, and he has had a 4lb weight gain since yesterday. Denies chest pain, abdominal pain, change in urination. Endorses LE edema.  Review of Systems  Positive: As above Negative: As above  Physical Exam  BP (!) 154/100 (BP Location: Right Arm)   Pulse 93   Temp 99 F (37.2 C) (Oral)   Resp (!) 22   Ht 5\' 7"  (1.702 m)   Wt (!) 143.8 kg   SpO2 94%   BMI 49.65 kg/m  Gen:   Awake, no distress   Resp:  Normal effort, some crackles at based MSK:   Moves extremities without difficulty  Other:  LE edema  Medical Decision Making  Medically screening exam initiated at 8:26 PM.  Appropriate orders placed.  Phillip [name] was informed that the remainder of the evaluation will be completed by another provider, this initial triage assessment does not replace that evaluation, and the importance of remaining in the ED until their evaluation is complete.  Heart failure   Phillip [name] 11/21/20 2028    Truddie Hidden, MD 11/22/20 [phone]

## 2020-11-22 ENCOUNTER — Inpatient Hospital Stay (HOSPITAL_COMMUNITY): Payer: Managed Care, Other (non HMO)

## 2020-11-22 ENCOUNTER — Ambulatory Visit: Payer: Managed Care, Other (non HMO) | Admitting: Primary Care

## 2020-11-22 DIAGNOSIS — Z833 Family history of diabetes mellitus: Secondary | ICD-10-CM | POA: Diagnosis not present

## 2020-11-22 DIAGNOSIS — I10 Essential (primary) hypertension: Secondary | ICD-10-CM

## 2020-11-22 DIAGNOSIS — I130 Hypertensive heart and chronic kidney disease with heart failure and stage 1 through stage 4 chronic kidney disease, or unspecified chronic kidney disease: Secondary | ICD-10-CM | POA: Diagnosis present

## 2020-11-22 DIAGNOSIS — Z800 Family history of malignant neoplasm of digestive organs: Secondary | ICD-10-CM | POA: Diagnosis not present

## 2020-11-22 DIAGNOSIS — I509 Heart failure, unspecified: Secondary | ICD-10-CM | POA: Diagnosis present

## 2020-11-22 DIAGNOSIS — I5021 Acute systolic (congestive) heart failure: Secondary | ICD-10-CM | POA: Diagnosis not present

## 2020-11-22 DIAGNOSIS — Z7901 Long term (current) use of anticoagulants: Secondary | ICD-10-CM | POA: Diagnosis not present

## 2020-11-22 DIAGNOSIS — G4733 Obstructive sleep apnea (adult) (pediatric): Secondary | ICD-10-CM

## 2020-11-22 DIAGNOSIS — Z7951 Long term (current) use of inhaled steroids: Secondary | ICD-10-CM | POA: Diagnosis not present

## 2020-11-22 DIAGNOSIS — I480 Paroxysmal atrial fibrillation: Secondary | ICD-10-CM | POA: Diagnosis not present

## 2020-11-22 DIAGNOSIS — Z6841 Body Mass Index (BMI) 40.0 and over, adult: Secondary | ICD-10-CM | POA: Diagnosis not present

## 2020-11-22 DIAGNOSIS — N184 Chronic kidney disease, stage 4 (severe): Secondary | ICD-10-CM | POA: Diagnosis present

## 2020-11-22 DIAGNOSIS — Z86711 Personal history of pulmonary embolism: Secondary | ICD-10-CM | POA: Diagnosis not present

## 2020-11-22 DIAGNOSIS — Z832 Family history of diseases of the blood and blood-forming organs and certain disorders involving the immune mechanism: Secondary | ICD-10-CM | POA: Diagnosis not present

## 2020-11-22 DIAGNOSIS — Z20822 Contact with and (suspected) exposure to covid-19: Secondary | ICD-10-CM | POA: Diagnosis present

## 2020-11-22 DIAGNOSIS — I5022 Chronic systolic (congestive) heart failure: Secondary | ICD-10-CM | POA: Diagnosis not present

## 2020-11-22 DIAGNOSIS — I5043 Acute on chronic combined systolic (congestive) and diastolic (congestive) heart failure: Secondary | ICD-10-CM | POA: Diagnosis present

## 2020-11-22 DIAGNOSIS — I4891 Unspecified atrial fibrillation: Secondary | ICD-10-CM | POA: Diagnosis not present

## 2020-11-22 DIAGNOSIS — Z86718 Personal history of other venous thrombosis and embolism: Secondary | ICD-10-CM | POA: Diagnosis not present

## 2020-11-22 DIAGNOSIS — I4819 Other persistent atrial fibrillation: Secondary | ICD-10-CM | POA: Diagnosis present

## 2020-11-22 DIAGNOSIS — Z8249 Family history of ischemic heart disease and other diseases of the circulatory system: Secondary | ICD-10-CM | POA: Diagnosis not present

## 2020-11-22 DIAGNOSIS — J449 Chronic obstructive pulmonary disease, unspecified: Secondary | ICD-10-CM | POA: Diagnosis present

## 2020-11-22 DIAGNOSIS — I5041 Acute combined systolic (congestive) and diastolic (congestive) heart failure: Secondary | ICD-10-CM | POA: Diagnosis not present

## 2020-11-22 DIAGNOSIS — I5023 Acute on chronic systolic (congestive) heart failure: Secondary | ICD-10-CM | POA: Diagnosis not present

## 2020-11-22 DIAGNOSIS — I4892 Unspecified atrial flutter: Secondary | ICD-10-CM | POA: Diagnosis not present

## 2020-11-22 DIAGNOSIS — Z9989 Dependence on other enabling machines and devices: Secondary | ICD-10-CM

## 2020-11-22 DIAGNOSIS — M109 Gout, unspecified: Secondary | ICD-10-CM | POA: Diagnosis present

## 2020-11-22 DIAGNOSIS — Z87891 Personal history of nicotine dependence: Secondary | ICD-10-CM | POA: Diagnosis not present

## 2020-11-22 DIAGNOSIS — Z79899 Other long term (current) drug therapy: Secondary | ICD-10-CM | POA: Diagnosis not present

## 2020-11-22 LAB — ECHOCARDIOGRAM COMPLETE
AR max vel: 4.7 cm2
AV Area VTI: 4.6 cm2
AV Area mean vel: 4.77 cm2
AV Mean grad: 5 mmHg
AV Peak grad: 10.8 mmHg
Ao pk vel: 1.64 m/s
Height: 67 in
MV M vel: 3.94 m/s
MV Peak grad: 62.1 mmHg
P 1/2 time: 433 msec
S' Lateral: 6.6 cm
Single Plane A4C EF: 36.4 %
Weight: 5072 oz

## 2020-11-22 LAB — SARS CORONAVIRUS 2 (TAT 6-24 HRS): SARS Coronavirus 2: NEGATIVE

## 2020-11-22 MED ORDER — CARVEDILOL 25 MG PO TABS
25.0000 mg | ORAL_TABLET | Freq: Two times a day (BID) | ORAL | Status: DC
  Administered 2020-11-22 – 2020-11-24 (×5): 25 mg via ORAL
  Filled 2020-11-22: qty 1
  Filled 2020-11-22: qty 2
  Filled 2020-11-22 (×3): qty 1

## 2020-11-22 MED ORDER — ACETAMINOPHEN 325 MG PO TABS: 650.0000 mg | ORAL_TABLET | ORAL | Status: DC | PRN

## 2020-11-22 MED ORDER — ISOSORBIDE MONONITRATE ER 60 MG PO TB24
60.0000 mg | ORAL_TABLET | Freq: Every day | ORAL | Status: DC
  Administered 2020-11-22: 60 mg via ORAL
  Filled 2020-11-22: qty 2

## 2020-11-22 MED ORDER — ALLOPURINOL 300 MG PO TABS
300.0000 mg | ORAL_TABLET | Freq: Every day | ORAL | Status: DC
  Administered 2020-11-22 – 2020-11-24 (×3): 300 mg via ORAL
  Filled 2020-11-22 (×3): qty 1

## 2020-11-22 MED ORDER — VITAMIN D 25 MCG (1000 UNIT) PO TABS
2000.0000 [IU] | ORAL_TABLET | Freq: Every day | ORAL | Status: DC
  Administered 2020-11-22 – 2020-11-24 (×3): 2000 [IU] via ORAL
  Filled 2020-11-22 (×3): qty 2

## 2020-11-22 MED ORDER — DAPAGLIFLOZIN PROPANEDIOL 10 MG PO TABS
10.0000 mg | ORAL_TABLET | Freq: Every day | ORAL | Status: DC
  Administered 2020-11-22 – 2020-11-24 (×3): 10 mg via ORAL
  Filled 2020-11-22 (×4): qty 1

## 2020-11-22 MED ORDER — SODIUM CHLORIDE 0.9% FLUSH: 3.0000 mL | INTRAVENOUS | Status: DC | PRN

## 2020-11-22 MED ORDER — FUROSEMIDE 10 MG/ML IJ SOLN
120.0000 mg | Freq: Two times a day (BID) | INTRAVENOUS | Status: AC
  Administered 2020-11-22: 120 mg via INTRAVENOUS
  Filled 2020-11-22: qty 12

## 2020-11-22 MED ORDER — MOMETASONE FURO-FORMOTEROL FUM 100-5 MCG/ACT IN AERO
2.0000 | INHALATION_SPRAY | Freq: Two times a day (BID) | RESPIRATORY_TRACT | Status: DC
  Administered 2020-11-22 – 2020-11-23 (×4): 2 via RESPIRATORY_TRACT
  Filled 2020-11-22 (×2): qty 8.8

## 2020-11-22 MED ORDER — AMIODARONE LOAD VIA INFUSION
150.0000 mg | Freq: Once | INTRAVENOUS | Status: AC
  Administered 2020-11-22: 150 mg via INTRAVENOUS
  Filled 2020-11-22: qty 83.34

## 2020-11-22 MED ORDER — POTASSIUM CHLORIDE CRYS ER 20 MEQ PO TBCR
40.0000 meq | EXTENDED_RELEASE_TABLET | Freq: Two times a day (BID) | ORAL | Status: DC
  Administered 2020-11-22 – 2020-11-24 (×6): 40 meq via ORAL
  Filled 2020-11-22 (×6): qty 2

## 2020-11-22 MED ORDER — AMIODARONE HCL IN DEXTROSE 360-4.14 MG/200ML-% IV SOLN
60.0000 mg/h | INTRAVENOUS | Status: AC
  Administered 2020-11-22 (×2): 60 mg/h via INTRAVENOUS
  Filled 2020-11-22: qty 200

## 2020-11-22 MED ORDER — AMIODARONE HCL IN DEXTROSE 360-4.14 MG/200ML-% IV SOLN
30.0000 mg/h | INTRAVENOUS | Status: DC
  Administered 2020-11-22: 30 mg/h via INTRAVENOUS
  Filled 2020-11-22 (×2): qty 200

## 2020-11-22 MED ORDER — HYDRALAZINE HCL 50 MG PO TABS
100.0000 mg | ORAL_TABLET | Freq: Three times a day (TID) | ORAL | Status: DC
  Administered 2020-11-22 – 2020-11-24 (×7): 100 mg via ORAL
  Filled 2020-11-22 (×7): qty 2

## 2020-11-22 MED ORDER — SODIUM CHLORIDE 0.9% FLUSH
3.0000 mL | Freq: Two times a day (BID) | INTRAVENOUS | Status: DC
  Administered 2020-11-22 – 2020-11-24 (×5): 3 mL via INTRAVENOUS

## 2020-11-22 MED ORDER — CARVEDILOL 3.125 MG PO TABS: 6.2500 mg | ORAL_TABLET | Freq: Two times a day (BID) | ORAL | Status: DC

## 2020-11-22 MED ORDER — ONDANSETRON HCL 4 MG/2ML IJ SOLN: 4.0000 mg | Freq: Four times a day (QID) | INTRAMUSCULAR | Status: DC | PRN

## 2020-11-22 MED ORDER — RIVAROXABAN 20 MG PO TABS
20.0000 mg | ORAL_TABLET | Freq: Every day | ORAL | Status: DC
  Administered 2020-11-22 – 2020-11-23 (×2): 20 mg via ORAL
  Filled 2020-11-22 (×2): qty 1

## 2020-11-22 MED ORDER — SODIUM CHLORIDE 0.9 % IV SOLN: 250.0000 mL | INTRAVENOUS | Status: DC | PRN

## 2020-11-22 MED ORDER — FUROSEMIDE 10 MG/ML IJ SOLN
100.0000 mg | Freq: Two times a day (BID) | INTRAMUSCULAR | Status: DC
  Administered 2020-11-22: 100 mg via INTRAVENOUS
  Filled 2020-11-22 (×2): qty 10

## 2020-11-22 NOTE — Progress Notes (Signed)
Per Dr. Kathalene Frames request, patient scheduled for DCCV tomorrow at 8:30am with Dr. Debara Pickett. Dr. Kathalene Frames note to follow, including his discussion of procedure with pt. Orders written.

## 2020-11-22 NOTE — Progress Notes (Addendum)
Cardiology Progress Note  Patient ID: Phillip [name] MRN: [id number] DOB: 1961-01-30 Date of Encounter: 11/22/2020  Primary Cardiologist: None  Subjective   Chief Complaint: Shortness of breath/irregular heart rate  HPI: Admitted with A. fib with RVR as well as congestive heart failure.  He symptoms appear to be more related to atrial fibrillation.  Denies chest pain well controlled.  No missed doses of Xarelto.  ROS:  All other ROS reviewed and negative. Pertinent positives noted in the HPI.     Inpatient Medications  Scheduled Meds:  allopurinol  300 mg Oral Daily   amiodarone  150 mg Intravenous Once   carvedilol  25 mg Oral BID WC   cholecalciferol  2,000 Units Oral Daily   dapagliflozin propanediol  10 mg Oral QAC breakfast   hydrALAZINE  100 mg Oral TID   isosorbide mononitrate  60 mg Oral Daily   mometasone-formoterol  2 puff Inhalation BID   potassium chloride SA  40 mEq Oral BID   rivaroxaban  20 mg Oral Q supper   sodium chloride flush  3 mL Intravenous Q12H   Continuous Infusions:  sodium chloride     amiodarone     Followed by   amiodarone     furosemide     PRN Meds: sodium chloride, acetaminophen, ondansetron (ZOFRAN) IV, sodium chloride flush   Vital Signs   Vitals:   11/22/20 0430 11/22/20 0500 11/22/20 0530 11/22/20 0600  BP: (!) 176/109 (!) 188/101 (!) 161/117 (!) 173/82  Pulse: 81 84 94 85  Resp: 18 (!) 21  18  Temp:      TempSrc:      SpO2: 97% 100% 100% 94%  Weight:      Height:        Intake/Output Summary (Last 24 hours) at 11/22/2020 0821 Last data filed at 11/22/2020 7035 Gross per 24 hour  Intake --  Output 2200 ml  Net -2200 ml   Last 3 Weights 11/21/2020 08/22/2020 03/09/2020  Weight (lbs) 317 lb 328 lb 323 lb  Weight (kg) 143.79 kg 148.78 kg 146.512 kg      Telemetry  Overnight telemetry shows atrial fibrillation heart rate 80s, which I personally reviewed.   ECG  The most recent ECG shows atrial fibrillation heart rate 87, no  acute ischemic changes, which I personally reviewed.   Physical Exam   Vitals:   11/22/20 0430 11/22/20 0500 11/22/20 0530 11/22/20 0600  BP: (!) 176/109 (!) 188/101 (!) 161/117 (!) 173/82  Pulse: 81 84 94 85  Resp: 18 (!) 21  18  Temp:      TempSrc:      SpO2: 97% 100% 100% 94%  Weight:      Height:        Intake/Output Summary (Last 24 hours) at 11/22/2020 0821 Last data filed at 11/22/2020 0093 Gross per 24 hour  Intake --  Output 2200 ml  Net -2200 ml    Last 3 Weights 11/21/2020 08/22/2020 03/09/2020  Weight (lbs) 317 lb 328 lb 323 lb  Weight (kg) 143.79 kg 148.78 kg 146.512 kg    Body mass index is 49.65 kg/m.   General: Well nourished, well developed, in no acute distress Head: Atraumatic, normal size  Eyes: PEERLA, EOMI  Neck: Supple, JVD 10-12 cmH2O Endocrine: No thryomegaly Cardiac: Normal S1, S2; irregular heart rate, no murmurs Lungs: Clear to auscultation bilaterally, no wheezing, rhonchi or rales  Abd: Soft, nontender, no hepatomegaly  Ext: Trace edema Musculoskeletal: No deformities, BUE and BLE  strength normal and equal Skin: Warm and dry, no rashes   Neuro: Alert and oriented to person, place, time, and situation, CNII-XII grossly intact, no focal deficits  Psych: Normal mood and affect   Labs  High Sensitivity Troponin:   Recent Labs  Lab 11/21/20 2030 11/21/20 2222  TROPONINIHS 53* 59*     Cardiac EnzymesNo results for input(s): TROPONINI in the last 168 hours. No results for input(s): TROPIPOC in the last 168 hours.  Chemistry Recent Labs  Lab 11/21/20 2030  NA 141  K 3.8  CL 106  CO2 27  GLUCOSE 96  BUN 33*  CREATININE 2.61*  CALCIUM 9.4  GFRNONAA 28*  ANIONGAP 8    Hematology Recent Labs  Lab 11/21/20 2030  WBC 4.1  RBC 4.05*  HGB 11.5*  HCT 37.7*  MCV 93.1  MCH 28.4  MCHC 30.5  RDW 15.9*  PLT 165   BNP Recent Labs  Lab 11/21/20 2030  BNP 949.0*    DDimer  Recent Labs  Lab 11/21/20 2030  DDIMER 0.45      Radiology  DG Chest 2 View  Result Date: 11/21/2020 CLINICAL DATA:  Shortness of breath and chest tightness. EXAM: CHEST - 2 VIEW COMPARISON:  May 13, 2019 FINDINGS: There is no evidence of acute infiltrate, pleural effusion or pneumothorax. Mild atelectasis is seen within the left lung base. The cardiac silhouette is mildly enlarged. The visualized skeletal structures are unremarkable. IMPRESSION: Stable cardiomegaly with mild left basilar atelectasis. Electronically Signed   By: Virgina Norfolk M.D.   On: 11/21/2020 20:55    Cardiac Studies  TTE 08/12/2019  1. Left ventricular ejection fraction, by estimation, is 45 to 50%. The  left ventricle has mildly decreased function. The left ventricle has no  regional wall motion abnormalities. The left ventricular internal cavity  size was mildly to moderately  dilated. There is mild concentric left ventricular hypertrophy. Left  ventricular diastolic parameters are indeterminate.   2. Right ventricular systolic function is normal. The right ventricular  size is normal.   3. Left atrial size was mildly dilated.   4. Right atrial size was mildly dilated.   5. The mitral valve is grossly normal. Trivial mitral valve  regurgitation. No evidence of mitral stenosis.   6. There is significant turbulent flow at the aortic valve, with  mild-moderate AR by PHT. Difficult to quantify visually given pattern of  signal; appears to be predominantly turbulence near aortic annulus, with  only small amount of color flow seen  moving from LVOT into LV. In parasternal short view, most prominent color  over right coronary cusp. There is no clear flow seen into RVOT, but  cannot exclude possible Sinus of Valsalva aneurysm or very small rupture.  The aortic valve was not well  visualized. Aortic valve regurgitation is mild to moderate.   Patient Profile  Phillip [name] is a 60 y.o. male with paroxysmal Xarelto, atrial flutter status post ablation, morbid  obesity, prior DVT/PE, systolic heart failure (27-78%) CKD stage IV, hypertension who was admitted on 11/22/2018 with acute on chronic systolic heart failure and atrial fibrillation with RVR.  Assessment & Plan   Acute on chronic systolic heart, EF 45 to 24% -Admitted with elevated BNP and shortness of breath.  Found to be in A. fib with RVR. -Has elevated JVD but is not that volume overloaded. -We will increase his diuretics to Lasix 120 mg IV twice daily. -BP is stable.  Continue Coreg 25 twice daily. -We  will recheck a TSH. -Echo is pending. -Close monitoring of kidney function. -Not a candidate for ACE/ARB/Arni/MRA given significant CKD. -On hydralazine 100 mg 3 times daily.  I have added Imdur 60 mg daily. -Continue Xarelto.  We will add amiodarone for rate control.  Plan for cardioversion tomorrow.  2.  Persistent atrial fibrillation with RVR -Suspect A. fib precipitated this.  He has been on Xarelto without any missed doses for greater than 3 weeks.  Plan for cardioversion tomorrow. -Start amiodarone drip.  Per review of records he has limited options for rhythm control strategy.  He was not felt to be a great candidate for ablation.  Tikosyn not a good idea given CKD.  Not a candidate for 1C agents.  Shared Decision Making/Informed Consent The risks (stroke, cardiac arrhythmias rarely resulting in the need for a temporary or permanent pacemaker, skin irritation or burns and complications associated with conscious sedation including aspiration, arrhythmia, respiratory failure and death), benefits (restoration of normal sinus rhythm) and alternatives of a direct current cardioversion were explained in detail to Mr. Alcock and he agrees to proceed.   3.  CKD stage IV -Close monitoring of renal function.  We will ask pharmacy to review dosing of Xarelto.  4.  Hypertension -Coreg, hydralazine and Imdur as above.  5.  Morbid obesity -Diet and exercise recommended.  FEN -No intravenous  fluids -Diet: Heart healthy, n.p.o. at midnight for cardioversion -Code: Full -DVT PPx: Xarelto  For questions or updates, please contact [place] Please consult [email or web address] for contact info under   Time Spent with Patient: I have spent a total of 35 minutes with patient reviewing hospital notes, telemetry, EKGs, labs and examining the patient as well as establishing an assessment and plan that was discussed with the patient.  > 50% of time was spent in direct patient care.    Signed, Addison Naegeli. Audie Box, MD, [place]  11/22/2020 8:21 AM

## 2020-11-22 NOTE — Progress Notes (Signed)
Per nurse, patient is noted to have HRs in the 50s at times while asleep. Awake HRs have been normal. This is consistent with known OSA. Telemetry shows atrial fib with CVR. Will continue plan as discussed - will be decreasing amiodarone dose shortly anyway. Nurse will notify for any significant bradycardia while awake.
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
## 2020-11-22 NOTE — Progress Notes (Signed)
Amiodarone Drug - Drug Interaction Consult Note  Recommendations: -Monitor electrolytes while on lasix. Goal K >4, Mg > 2  -Monitor CBGs  -Monitor for s/s of bleeding while on concomitant Xarelto   Amiodarone is metabolized by the cytochrome P450 system and therefore has the potential to cause many drug interactions. Amiodarone has an average plasma half-life of 50 days (range 20 to 100 days).   There is potential for drug interactions to occur several weeks or months after stopping treatment and the onset of drug interactions may be slow after initiating amiodarone.   []  Statins: Increased risk of myopathy. Simvastatin- restrict dose to 20mg  daily. Other statins: counsel patients to report any muscle pain or weakness immediately.  [x]  Anticoagulants: Amiodarone can increase anticoagulant effect. Consider warfarin dose reduction. Patients should be monitored closely and the dose of anticoagulant altered accordingly, remembering that amiodarone levels take several weeks to stabilize.  []  Antiepileptics: Amiodarone can increase plasma concentration of phenytoin, the dose should be reduced. Note that small changes in phenytoin dose can result in large changes in levels. Monitor patient and counsel on signs of toxicity.  [x]  Beta blockers: increased risk of bradycardia, AV block and myocardial depression. Sotalol - avoid concomitant use.  []   Calcium channel blockers (diltiazem and verapamil): increased risk of bradycardia, AV block and myocardial depression.  []   Cyclosporine: Amiodarone increases levels of cyclosporine. Reduced dose of cyclosporine is recommended.  []  Digoxin dose should be halved when amiodarone is started.  [x]  Diuretics: increased risk of cardiotoxicity if hypokalemia occurs.  [x]  Oral hypoglycemic agents (glyburide, glipizide, glimepiride): increased risk of hypoglycemia. Patient's glucose levels should be monitored closely when initiating amiodarone therapy.    []  Drugs that prolong the QT interval:  Torsades de pointes risk may be increased with concurrent use - avoid if possible.  Monitor QTc, also keep magnesium/potassium WNL if concurrent therapy can't be avoided.  Antibiotics: e.g. fluoroquinolones, erythromycin.  Antiarrhythmics: e.g. quinidine, procainamide, disopyramide, sotalol.  Antipsychotics: e.g. phenothiazines, haloperidol.   Lithium, tricyclic antidepressants, and methadone.  Thank You,  Albertina Parr, PharmD., BCPS, BCCCP Clinical Pharmacist Please refer to [facility] for unit-specific pharmacist

## 2020-11-22 NOTE — Progress Notes (Signed)
When patient is sleeping, his heart rate drops into the 60's. Cardiology informed, no changes at this time.

## 2020-11-23 ENCOUNTER — Inpatient Hospital Stay (HOSPITAL_COMMUNITY): Payer: Managed Care, Other (non HMO) | Admitting: Anesthesiology

## 2020-11-23 ENCOUNTER — Encounter (HOSPITAL_COMMUNITY): Payer: Self-pay | Admitting: Student

## 2020-11-23 ENCOUNTER — Encounter (HOSPITAL_COMMUNITY)
Admission: EM | Disposition: A | Discharge status: Home / Self Care | Payer: Self-pay | Source: Home / Self Care | Attending: Cardiovascular Disease

## 2020-11-23 DIAGNOSIS — I4891 Unspecified atrial fibrillation: Secondary | ICD-10-CM | POA: Diagnosis not present

## 2020-11-23 DIAGNOSIS — N184 Chronic kidney disease, stage 4 (severe): Secondary | ICD-10-CM | POA: Diagnosis not present

## 2020-11-23 DIAGNOSIS — I480 Paroxysmal atrial fibrillation: Secondary | ICD-10-CM | POA: Diagnosis not present

## 2020-11-23 DIAGNOSIS — I4892 Unspecified atrial flutter: Secondary | ICD-10-CM | POA: Diagnosis not present

## 2020-11-23 DIAGNOSIS — I5021 Acute systolic (congestive) heart failure: Secondary | ICD-10-CM | POA: Diagnosis not present

## 2020-11-23 DIAGNOSIS — I10 Essential (primary) hypertension: Secondary | ICD-10-CM | POA: Diagnosis not present

## 2020-11-23 HISTORY — PX: CARDIOVERSION: SHX1299

## 2020-11-23 LAB — BASIC METABOLIC PANEL
Anion gap: 10 (ref 5–15)
BUN: 34 mg/dL — ABNORMAL HIGH (ref 6–20)
CO2: 22 mmol/L (ref 22–32)
Calcium: 8.7 mg/dL — ABNORMAL LOW (ref 8.9–10.3)
Chloride: 106 mmol/L (ref 98–111)
Creatinine, Ser: 2.8 mg/dL — ABNORMAL HIGH (ref 0.61–1.24)
GFR, Estimated: 25 mL/min — ABNORMAL LOW (ref >60)
Glucose, Bld: 91 mg/dL (ref 70–99)
Potassium: 4.2 mmol/L (ref 3.5–5.1)
Sodium: 138 mmol/L (ref 135–145)

## 2020-11-23 LAB — HIV ANTIBODY (ROUTINE TESTING W REFLEX): HIV Screen 4th Generation wRfx: NONREACTIVE

## 2020-11-23 LAB — MAGNESIUM: Magnesium: 2.2 mg/dL (ref 1.7–2.4)

## 2020-11-23 LAB — TSH: TSH: 2.143 u[IU]/mL (ref 0.350–4.500)

## 2020-11-23 SURGERY — CARDIOVERSION
Anesthesia: General

## 2020-11-23 MED ORDER — PROPOFOL 10 MG/ML IV BOLUS
INTRAVENOUS | Status: DC | PRN
  Administered 2020-11-23: 60 mg via INTRAVENOUS

## 2020-11-23 MED ORDER — AMIODARONE HCL 200 MG PO TABS
400.0000 mg | ORAL_TABLET | Freq: Two times a day (BID) | ORAL | Status: DC
  Administered 2020-11-23 – 2020-11-24 (×3): 400 mg via ORAL
  Filled 2020-11-23 (×3): qty 2

## 2020-11-23 MED ORDER — SODIUM CHLORIDE 0.9 % IV SOLN: INTRAVENOUS | Status: DC | PRN

## 2020-11-23 MED ORDER — LIDOCAINE 2% (20 MG/ML) 5 ML SYRINGE
INTRAMUSCULAR | Status: DC | PRN
  Administered 2020-11-23: 50 mg via INTRAVENOUS

## 2020-11-23 MED ORDER — ISOSORBIDE MONONITRATE ER 60 MG PO TB24
120.0000 mg | ORAL_TABLET | Freq: Every day | ORAL | Status: DC
  Administered 2020-11-23 – 2020-11-24 (×2): 120 mg via ORAL
  Filled 2020-11-23 (×2): qty 2

## 2020-11-23 MED ORDER — AMIODARONE HCL 200 MG PO TABS: 200.0000 mg | ORAL_TABLET | Freq: Every day | ORAL | Status: DC

## 2020-11-23 MED ORDER — FUROSEMIDE 10 MG/ML IJ SOLN
120.0000 mg | Freq: Two times a day (BID) | INTRAVENOUS | Status: DC
  Administered 2020-11-23: 120 mg via INTRAVENOUS
  Filled 2020-11-23 (×4): qty 12

## 2020-11-23 NOTE — CV Procedure (Signed)
   CARDIOVERSION NOTE  Procedure: Electrical Cardioversion Indications:  Atrial Fibrillation  Procedure Details:  Consent: Risks of procedure as well as the alternatives and risks of each were explained to the (patient/caregiver).  Consent for procedure obtained.  Time Out: Verified patient identification, verified procedure, site/side was marked, verified correct patient position, special equipment/implants available, medications/allergies/relevent history reviewed, required imaging and test results available.  Performed  Patient placed on cardiac monitor, pulse oximetry, supplemental oxygen as necessary.  Sedation given:  per anesthesia Pacer pads placed anterior and posterior chest.  Cardioverted 2 time(s).  Cardioverted at 200J biphasic x 2.  Impression: Findings: Post procedure EKG shows:  sinus bradycardia Complications: None Patient did tolerate procedure well.  Plan: Successful DCCV after 2 stacked shocks at 200J biphasic  Time Spent Directly with the Patient:  30 minutes   Pixie Casino, MD, [facility], [place] Director of the Advanced Lipid Disorders &  Cardiovascular Risk Reduction Clinic Diplomate of the American Board of Clinical Lipidology Attending Cardiologist  Direct Dial: [phone]  Fax: [phone]  Website:  [email or web address] 11/23/2020, 8:33 AM

## 2020-11-23 NOTE — Transfer of Care (Signed)
Immediate Anesthesia Transfer of Care Note  Patient: Phillip [name]  Procedure(s) Performed: CARDIOVERSION  Patient Location: Endoscopy Unit  Anesthesia Type:General  Level of Consciousness: drowsy  Airway & Oxygen Therapy: Patient Spontanous Breathing and Patient connected to face mask oxygen  Post-op Assessment: Report given to RN and Post -op Vital signs reviewed and stable  Post vital signs: Reviewed and stable  Last Vitals:  Vitals Value Taken Time  BP 135/63 11/23/20 0834  Temp    Pulse 51 11/23/20 0834  Resp 13 11/23/20 0834  SpO2 100 % 11/23/20 0834  Vitals shown include unvalidated device data.  Last Pain:  Vitals:   11/23/20 0732  TempSrc: Temporal  PainSc: 0-No pain         Complications: No notable events documented.

## 2020-11-23 NOTE — Progress Notes (Signed)
Heart Failure Navigator Progress Note  Assessed for Heart & Vascular TOC clinic readiness.  Patient does not meet criteria due to CKD IV, SCr 2.8.   Navigator available for reassessment of patient.   Pricilla Holm, MSN, RN Heart Failure Nurse Navigator [phone]

## 2020-11-23 NOTE — Interval H&P Note (Signed)
History and Physical Interval Note:  11/23/2020 7:40 AM  Phillip [name]  has presented today for surgery, with the diagnosis of afib.  The various methods of treatment have been discussed with the patient and family. After consideration of risks, benefits and other options for treatment, the patient has consented to  Procedure(s): CARDIOVERSION (N/A) as a surgical intervention.  The patient's history has been reviewed, patient examined, no change in status, stable for surgery.  I have reviewed the patient's chart and labs.  Questions were answered to the patient's satisfaction.     Pixie Casino

## 2020-11-23 NOTE — Anesthesia Preprocedure Evaluation (Addendum)
Anesthesia Evaluation  Patient identified by MRN, date of birth, ID band Patient awake    Reviewed: Allergy & Precautions, NPO status , Patient's Chart, lab work & pertinent test results, reviewed documented beta blocker date and time   Airway Mallampati: III  TM Distance: >3 FB Neck ROM: Full    Dental no notable dental hx. (+) Teeth Intact, Dental Advisory Given   Pulmonary asthma , sleep apnea and Continuous Positive Airway Pressure Ventilation , pneumonia, resolved, COPD,  COPD inhaler, Patient abstained from smoking., former smoker,    Pulmonary exam normal breath sounds clear to auscultation       Cardiovascular hypertension, Pt. on medications and Pt. on home beta blockers +CHF   Rhythm:Irregular Rate:Normal     Neuro/Psych negative neurological ROS  negative psych ROS   GI/Hepatic negative GI ROS, Neg liver ROS,   Endo/Other  Morbid obesityHyperlipidemia Gout  Renal/GU Renal InsufficiencyRenal disease  negative genitourinary   Musculoskeletal negative musculoskeletal ROS (+)   Abdominal (+) + obese,   Peds  Hematology Xarelto therapy- last dose yesterday pm   Anesthesia Other Findings   Reproductive/Obstetrics                            Anesthesia Physical Anesthesia Plan  ASA: 3  Anesthesia Plan: General   Post-op Pain Management:    Induction: Intravenous  PONV Risk Score and Plan: 3 and Treatment may vary due to age or medical condition  Airway Management Planned: Natural Airway and Mask  Additional Equipment:   Intra-op Plan:   Post-operative Plan:   Informed Consent: I have reviewed the patients History and Physical, chart, labs and discussed the procedure including the risks, benefits and alternatives for the proposed anesthesia with the patient or authorized representative who has indicated his/her understanding and acceptance.     Dental advisory  given  Plan Discussed with: CRNA and Anesthesiologist  Anesthesia Plan Comments:         Anesthesia Quick Evaluation

## 2020-11-23 NOTE — Discharge Instructions (Addendum)
Heart Failure Education: Weigh yourself EVERY morning after you go to the bathroom but before you eat or drink anything. Write this number down in a weight log/diary. If you gain 3 pounds overnight or 5 pounds in a week, call the office. Take your medicines as prescribed. If you have concerns about your medications, please call us before you stop taking them.  Eat low salt foods--Limit salt (sodium) to 2000 mg per day. This will help prevent your body from holding onto fluid. Read food labels as many processed foods have a lot of sodium, especially canned goods and prepackaged meats. If you would like some assistance choosing low sodium foods, we would be happy to set you up with a nutritionist. Stay as active as you can everyday. Staying active will give you more energy and make your muscles stronger. Start with 5 minutes at a time and work your way up to 30 minutes a day. Break up your activities--do some in the morning and some in the afternoon. Start with 3 days per week and work your way up to 5 days as you can.  If you have chest pain, feel short of breath, dizzy, or lightheaded, STOP. If you don't feel better after a short rest, call 911. If you do feel better, call the office to let us know you have symptoms with exercise. Limit all fluids for the day to less than 2 liters. Fluid includes all drinks, coffee, juice, ice chips, soup, jello, and all other liquids.  Please avoid missing doses of your Xarelto - you are at a higher risk for stroke in the month following a cardioversion if not compliant with your medications.   Please discuss alternative gout management with your PCP as there is a potential interaction between colchicine and amiodarone which may increase toxicity risks of colchicine. Also may not be ideal management for your gout with your renal dysfunction.   Information on my medicine - XARELTO (Rivaroxaban)  Why was Xarelto prescribed for you? Xarelto was prescribed for you to  reduce the risk of a blood clot forming that can cause a stroke if you have a medical condition called atrial fibrillation (a type of irregular heartbeat).  What do you need to know about xarelto ? Take your Xarelto ONCE DAILY at the same time every day with your evening meal. If you have difficulty swallowing the tablet whole, you may crush it and mix in applesauce just prior to taking your dose.  Take Xarelto exactly as prescribed by your doctor and DO NOT stop taking Xarelto without talking to the doctor who prescribed the medication.  Stopping without other stroke prevention medication to take the place of Xarelto may increase your risk of developing a clot that causes a stroke.  Refill your prescription before you run out.  After discharge, you should have regular check-up appointments with your healthcare provider that is prescribing your Xarelto.  In the future your dose may need to be changed if your kidney function or weight changes by a significant amount.  What do you do if you miss a dose? If you are taking Xarelto ONCE DAILY and you miss a dose, take it as soon as you remember on the same day then continue your regularly scheduled once daily regimen the next day. Do not take two doses of Xarelto at the same time or on the same day.   Important Safety Information A possible side effect of Xarelto is bleeding. You should call your healthcare provider right  away if you experience any of the following: Bleeding from an injury or your nose that does not stop. Unusual colored urine (red or dark brown) or unusual colored stools (red or black). Unusual bruising for unknown reasons. A serious fall or if you hit your head (even if there is no bleeding).  Some medicines may interact with Xarelto and might increase your risk of bleeding while on Xarelto. To help avoid this, consult your healthcare provider or pharmacist prior to using any new prescription or non-prescription  medications, including herbals, vitamins, non-steroidal anti-inflammatory drugs (NSAIDs) and supplements.  This website has more information on Xarelto: [email or web address]

## 2020-11-23 NOTE — Plan of Care (Signed)
  Problem: Skin Integrity: Goal: Risk for impaired skin integrity will decrease Outcome: Completed/Met   Problem: Safety: Goal: Ability to remain free from injury will improve Outcome: Completed/Met   Problem: Pain Managment: Goal: General experience of comfort will improve Outcome: Completed/Met   Problem: Elimination: Goal: Will not experience complications related to urinary retention Outcome: Completed/Met   Problem: Nutrition: Goal: Adequate nutrition will be maintained Outcome: Completed/Met   Problem: Elimination: Goal: Will not experience complications related to bowel motility Outcome: Progressing   Problem: Elimination: Goal: Will not experience complications related to urinary retention Outcome: Completed/Met

## 2020-11-23 NOTE — Progress Notes (Signed)
Cardiology Progress Note  Patient ID: Phillip [name] MRN: [id number] DOB: 06-15-1960 Date of Encounter: 11/23/2020  Primary Cardiologist: None  Subjective   Chief Complaint: Shortness of breath  HPI: Good urine output.  Volume status improving.  Still not back to baseline.  Seen in endoscopy suite.  Plan for cardioversion today.  ROS:  All other ROS reviewed and negative. Pertinent positives noted in the HPI.     Inpatient Medications  Scheduled Meds:  [MAR Hold] allopurinol  300 mg Oral Daily   [MAR Hold] carvedilol  25 mg Oral BID WC   [MAR Hold] cholecalciferol  2,000 Units Oral Daily   [MAR Hold] dapagliflozin propanediol  10 mg Oral QAC breakfast   [MAR Hold] hydrALAZINE  100 mg Oral TID   [MAR Hold] isosorbide mononitrate  120 mg Oral Daily   [MAR Hold] mometasone-formoterol  2 puff Inhalation BID   [MAR Hold] potassium chloride SA  40 mEq Oral BID   [MAR Hold] rivaroxaban  20 mg Oral Q supper   [MAR Hold] sodium chloride flush  3 mL Intravenous Q12H   Continuous Infusions:  [MAR Hold] sodium chloride     amiodarone 30 mg/hr (11/22/20 2314)   [MAR Hold] furosemide     PRN Meds: [MAR Hold] sodium chloride, [MAR Hold] acetaminophen, [MAR Hold] ondansetron (ZOFRAN) IV, [MAR Hold] sodium chloride flush   Vital Signs   Vitals:   11/22/20 2004 11/22/20 2315 11/23/20 0405 11/23/20 0732  BP: (!) 141/82 (!) 112/57 (!) 158/81 (!) 166/79  Pulse: 66 (!) 53 62 (!) 58  Resp: 20 20 20  (!) 23  Temp: 98.5 F (36.9 C)  97.7 F (36.5 C) (!) 96.9 F (36.1 C)  TempSrc: Oral  Oral Temporal  SpO2: 98% 100% 99% 98%  Weight:   (!) 145.5 kg (!) 144.2 kg  Height:    5\' 9"  (1.753 m)    Intake/Output Summary (Last 24 hours) at 11/23/2020 0825 Last data filed at 11/23/2020 0700 Gross per 24 hour  Intake 1041.2 ml  Output 1950 ml  Net -908.8 ml   Last 3 Weights 11/23/2020 11/23/2020 11/22/2020  Weight (lbs) 318 lb 320 lb 12.8 oz 318 lb 5.5 oz  Weight (kg) 144.244 kg 145.514 kg 144.4 kg       Telemetry  Overnight telemetry shows atrial fibrillation heart rate controlled in the 80s, which I personally reviewed.    Physical Exam   Vitals:   11/22/20 2004 11/22/20 2315 11/23/20 0405 11/23/20 0732  BP: (!) 141/82 (!) 112/57 (!) 158/81 (!) 166/79  Pulse: 66 (!) 53 62 (!) 58  Resp: 20 20 20  (!) 23  Temp: 98.5 F (36.9 C)  97.7 F (36.5 C) (!) 96.9 F (36.1 C)  TempSrc: Oral  Oral Temporal  SpO2: 98% 100% 99% 98%  Weight:   (!) 145.5 kg (!) 144.2 kg  Height:    5\' 9"  (1.753 m)    Intake/Output Summary (Last 24 hours) at 11/23/2020 0825 Last data filed at 11/23/2020 0700 Gross per 24 hour  Intake 1041.2 ml  Output 1950 ml  Net -908.8 ml    Last 3 Weights 11/23/2020 11/23/2020 11/22/2020  Weight (lbs) 318 lb 320 lb 12.8 oz 318 lb 5.5 oz  Weight (kg) 144.244 kg 145.514 kg 144.4 kg    Body mass index is 46.96 kg/m.  General: Well nourished, well developed, in no acute distress Head: Atraumatic, normal size  Eyes: PEERLA, EOMI  Neck: Supple, JVD 7 to 8 cm of water Endocrine: No  thryomegaly Cardiac: Normal S1, S2; irregular rhythm, no murmurs Lungs: Clear to auscultation bilaterally, no wheezing, rhonchi or rales  Abd: Soft, nontender, no hepatomegaly  Ext: No edema, pulses 2+ Musculoskeletal: No deformities, BUE and BLE strength normal and equal Skin: Warm and dry, no rashes   Neuro: Alert and oriented to person, place, time, and situation, CNII-XII grossly intact, no focal deficits  Psych: Normal mood and affect   Labs  High Sensitivity Troponin:   Recent Labs  Lab 11/21/20 2030 11/21/20 2222  TROPONINIHS 53* 59*     Cardiac EnzymesNo results for input(s): TROPONINI in the last 168 hours. No results for input(s): TROPIPOC in the last 168 hours.  Chemistry Recent Labs  Lab 11/21/20 2030 11/23/20 0527  NA 141 138  K 3.8 4.2  CL 106 106  CO2 27 22  GLUCOSE 96 91  BUN 33* 34*  CREATININE 2.61* 2.80*  CALCIUM 9.4 8.7*  GFRNONAA 28* 25*  ANIONGAP 8 10     Hematology Recent Labs  Lab 11/21/20 2030  WBC 4.1  RBC 4.05*  HGB 11.5*  HCT 37.7*  MCV 93.1  MCH 28.4  MCHC 30.5  RDW 15.9*  PLT 165   BNP Recent Labs  Lab 11/21/20 2030  BNP 949.0*    DDimer  Recent Labs  Lab 11/21/20 2030  DDIMER 0.45     Radiology  DG Chest 2 View  Result Date: 11/21/2020 CLINICAL DATA:  Shortness of breath and chest tightness. EXAM: CHEST - 2 VIEW COMPARISON:  May 13, 2019 FINDINGS: There is no evidence of acute infiltrate, pleural effusion or pneumothorax. Mild atelectasis is seen within the left lung base. The cardiac silhouette is mildly enlarged. The visualized skeletal structures are unremarkable. IMPRESSION: Stable cardiomegaly with mild left basilar atelectasis. Electronically Signed   By: Virgina Norfolk M.D.   On: 11/21/2020 20:55   ECHOCARDIOGRAM COMPLETE  Result Date: 11/22/2020    ECHOCARDIOGRAM REPORT   Patient Name:   Phillip [name] Date of Exam: 11/22/2020 Medical Rec #:  [id number]  Height:       67.0 in Accession #:    [id number] Weight:       317.0 lb Date of Birth:  01/05/1961   BSA:          2.459 m Patient Age:    60 years   BP:           183/84 mmHg Patient Gender: M          HR:           86 bpm. Exam Location:  Inpatient Procedure: 2D Echo, Cardiac Doppler and Color Doppler Indications:    Y18.56 Chronic systolic (congestive) heart failure  History:        Patient has prior history of Echocardiogram examinations, most                 recent 08/12/2019. CHF, Arrythmias:Atrial Fibrillation; Risk                 Factors:Hypertension.  Sonographer:    [place] Referring Phys: [id number] [place]  1. Left ventricular ejection fraction, by estimation, is 45 to 50%. The left ventricle has severely decreased function. The left ventricle demonstrates global hypokinesis. The left ventricular internal cavity size was severely dilated. There is mild left ventricular hypertrophy. Left ventricular diastolic parameters are indeterminate.  2.  Right ventricular systolic function is normal. The right ventricular size is normal.  3. Left atrial size was severely dilated.  4. Right atrial size was moderately dilated.  5. The mitral valve is normal in structure. Mild mitral valve regurgitation. No evidence of mitral stenosis.  6. The aortic valve is normal in structure. Aortic valve regurgitation moderate eccentric anterior directed. No aortic stenosis is present.  7. The inferior vena cava is normal in size with greater than 50% respiratory variability, suggesting right atrial pressure of 3 mmHg. Comparison(s): No significant change from prior study. Prior images reviewed side by side. FINDINGS  Left Ventricle: Left ventricular ejection fraction, by estimation, is 45 to 50%. The left ventricle has severely decreased function. The left ventricle demonstrates global hypokinesis. The left ventricular internal cavity size was severely dilated. There is mild left ventricular hypertrophy. Left ventricular diastolic parameters are indeterminate. Right Ventricle: The right ventricular size is normal. No increase in right ventricular wall thickness. Right ventricular systolic function is normal. Left Atrium: Left atrial size was severely dilated. Right Atrium: Right atrial size was moderately dilated. Pericardium: There is no evidence of pericardial effusion. Mitral Valve: The mitral valve is normal in structure. Mild mitral valve regurgitation. No evidence of mitral valve stenosis. Tricuspid Valve: The tricuspid valve is normal in structure. Tricuspid valve regurgitation is not demonstrated. No evidence of tricuspid stenosis. Aortic Valve: The aortic valve is normal in structure. Aortic valve regurgitation moderate eccentric anterior directed. Aortic regurgitation PHT measures 433 msec. No aortic stenosis is present. Aortic valve mean gradient measures 5.0 mmHg. Aortic valve peak gradient measures 10.8 mmHg. Aortic valve area, by VTI measures 4.60 cm. Pulmonic  Valve: The pulmonic valve was normal in structure. Pulmonic valve regurgitation is not visualized. No evidence of pulmonic stenosis. Aorta: The aortic root is normal in size and structure. Venous: The inferior vena cava is normal in size with greater than 50% respiratory variability, suggesting right atrial pressure of 3 mmHg. IAS/Shunts: No atrial level shunt detected by color flow Doppler.  LEFT VENTRICLE PLAX 2D LVIDd:         7.70 cm      Diastology LVIDs:         6.60 cm      LV e' medial:  8.59 cm/s LV PW:         0.90 cm      LV e' lateral: 9.03 cm/s LV IVS:        1.40 cm LVOT diam:     3.00 cm LV SV:         142 LV SV Index:   58 LVOT Area:     7.07 cm  LV Volumes (MOD) LV vol d, MOD A4C: 176.0 ml LV vol s, MOD A4C: 112.0 ml LV SV MOD A4C:     176.0 ml RIGHT VENTRICLE            IVC RV S prime:     7.62 cm/s  IVC diam: 2.00 cm TAPSE (M-mode): 2.0 cm LEFT ATRIUM            Index       RIGHT ATRIUM           Index LA diam:      5.50 cm  2.24 cm/m  RA Area:     20.50 cm LA Vol (A2C): 143.0 ml 58.14 ml/m RA Volume:   57.40 ml  23.34 ml/m LA Vol (A4C): 68.5 ml  27.85 ml/m  AORTIC VALVE AV Area (Vmax):    4.70 cm AV Area (Vmean):   4.77 cm AV Area (VTI):     4.60  cm AV Vmax:           164.00 cm/s AV Vmean:          106.000 cm/s AV VTI:            0.309 m AV Peak Grad:      10.8 mmHg AV Mean Grad:      5.0 mmHg LVOT Vmax:         109.00 cm/s LVOT Vmean:        71.500 cm/s LVOT VTI:          0.201 m LVOT/AV VTI ratio: 0.65 AI PHT:            433 msec  AORTA Ao Root diam: 3.50 cm Ao Asc diam:  3.70 cm MR Peak grad: 62.1 mmHg MR Vmax:      394.00 cm/s SHUNTS                           Systemic VTI:  0.20 m                           Systemic Diam: 3.00 cm Candee Furbish MD Electronically signed by Candee Furbish MD Signature Date/Time: 11/22/2020/11:50:33 AM    Final     Cardiac Studies  TTE 11/22/2020  1. Left ventricular ejection fraction, by estimation, is 45 to 50%. The  left ventricle has severely decreased  function. The left ventricle  demonstrates global hypokinesis. The left ventricular internal cavity size  was severely dilated. There is mild  left ventricular hypertrophy. Left ventricular diastolic parameters are  indeterminate.   2. Right ventricular systolic function is normal. The right ventricular  size is normal.   3. Left atrial size was severely dilated.   4. Right atrial size was moderately dilated.   5. The mitral valve is normal in structure. Mild mitral valve  regurgitation. No evidence of mitral stenosis.   6. The aortic valve is normal in structure. Aortic valve regurgitation  moderate eccentric anterior directed. No aortic stenosis is present.   7. The inferior vena cava is normal in size with greater than 50%  respiratory variability, suggesting right atrial pressure of 3 mmHg.   Patient Profile  Phillip [name] is a 60 y.o. male with paroxysmal Xarelto, atrial flutter status post ablation, morbid obesity, prior DVT/PE, systolic heart failure (10-27%) CKD stage IV, hypertension who was admitted on 11/22/2018 with acute on chronic systolic heart failure and atrial fibrillation with RVR.  Assessment & Plan   #Acute on chronic systolic heart failure, EF 45-50% -Admitted with elevated BNP, JVD as well as A. fib with RVR. -Good diuresis.  Volume status is approaching euvolemia. -Planning for cardioversion today.  Has been on Xarelto.  Prefer rhythm control strategy.  He was started on amiodarone.  Per review of notes his only option for rhythm control medication.  Not a candidate for ablation in the past. -Transition to p.o. amnio after cardioversion today. -We will continue with 120 mg of IV Lasix twice daily.  Suspect 1 more day and discharge tomorrow. -Kidney function stable. -Not a candidate for ACE/ARB/Arni/MRA given significant CKD.  We will continue hydralazine 100 mg 3 times daily.  Increase Imdur to 120 mg daily.  Continue Coreg 25 twice daily.  #Persistent atrial  fibrillation with RVR -Either A. fib precipitated heart failure or heart failure precipitated A. fib.  Has been on Xarelto without any missed doses in  greater than 3 weeks.  Cardioversion this morning. -On amiodarone drip.  We will plan to transition to oral amiodarone after cardioversion. -Not a candidate for Tikosyn or other agents per prior documentation by electrophysiology.  #CKD stage IV -Stable.  #Hypertension -See heart failure medications.  #Morbid obesity/OSA -Weight loss recommended.  Needs to wear CPAP.  FEN -NPO -no IVF -code: full -dvt ppx: xarelto   For questions or updates, please contact [place] Please consult [email or web address] for contact info under   Time Spent with Patient: I have spent a total of 35 minutes with patient reviewing hospital notes, telemetry, EKGs, labs and examining the patient as well as establishing an assessment and plan that was discussed with the patient.  > 50% of time was spent in direct patient care.    Signed, Addison Naegeli. Audie Box, MD, [place]  11/23/2020 8:25 AM

## 2020-11-23 NOTE — Anesthesia Postprocedure Evaluation (Signed)
Anesthesia Post Note  Patient: Phillip [name]  Procedure(s) Performed: CARDIOVERSION     Patient location during evaluation: PACU Anesthesia Type: General Level of consciousness: awake and alert and oriented Pain management: pain level controlled Vital Signs Assessment: post-procedure vital signs reviewed and stable Respiratory status: spontaneous breathing, nonlabored ventilation and respiratory function stable Cardiovascular status: blood pressure returned to baseline and stable Postop Assessment: no apparent nausea or vomiting Anesthetic complications: no   No notable events documented.  Last Vitals:  Vitals:   11/23/20 0732 11/23/20 0834  BP: (!) 166/79 135/63  Pulse: (!) 58 (!) 51  Resp: (!) 23 13  Temp: (!) 36.1 C   SpO2: 98% 100%    Last Pain:  Vitals:   11/23/20 0834  TempSrc:   PainSc: 0-No pain                 [name]

## 2020-11-24 DIAGNOSIS — I5023 Acute on chronic systolic (congestive) heart failure: Secondary | ICD-10-CM

## 2020-11-24 DIAGNOSIS — I4891 Unspecified atrial fibrillation: Secondary | ICD-10-CM | POA: Diagnosis not present

## 2020-11-24 DIAGNOSIS — I4892 Unspecified atrial flutter: Secondary | ICD-10-CM | POA: Diagnosis not present

## 2020-11-24 LAB — BASIC METABOLIC PANEL
Anion gap: 9 (ref 5–15)
BUN: 37 mg/dL — ABNORMAL HIGH (ref 6–20)
CO2: 26 mmol/L (ref 22–32)
Calcium: 8.8 mg/dL — ABNORMAL LOW (ref 8.9–10.3)
Chloride: 105 mmol/L (ref 98–111)
Creatinine, Ser: 3.04 mg/dL — ABNORMAL HIGH (ref 0.61–1.24)
GFR, Estimated: 23 mL/min — ABNORMAL LOW (ref >60)
Glucose, Bld: 86 mg/dL (ref 70–99)
Potassium: 4.4 mmol/L (ref 3.5–5.1)
Sodium: 140 mmol/L (ref 135–145)

## 2020-11-24 MED ORDER — AMIODARONE HCL 200 MG PO TABS: 200.0000 mg | ORAL_TABLET | Freq: Every day | ORAL | 3 refills | Status: DC

## 2020-11-24 MED ORDER — CARVEDILOL 25 MG PO TABS: 25.0000 mg | ORAL_TABLET | Freq: Two times a day (BID) | ORAL | 3 refills | Status: DC

## 2020-11-24 MED ORDER — TORSEMIDE 20 MG PO TABS: 60.0000 mg | ORAL_TABLET | Freq: Two times a day (BID) | ORAL | Status: DC

## 2020-11-24 MED ORDER — AMIODARONE HCL 400 MG PO TABS: 400.0000 mg | ORAL_TABLET | Freq: Two times a day (BID) | ORAL | 0 refills | Status: DC

## 2020-11-24 MED ORDER — ISOSORBIDE MONONITRATE ER 120 MG PO TB24: 120.0000 mg | ORAL_TABLET | Freq: Every day | ORAL | 3 refills | Status: DC

## 2020-11-24 NOTE — Plan of Care (Signed)
  Problem: Education: Goal: Knowledge of General Education information will improve Description: Including pain rating scale, medication(s)/side effects and non-pharmacologic comfort measures Outcome: Progressing   Problem: Health Behavior/Discharge Planning: Goal: Ability to manage health-related needs will improve Outcome: Progressing   Problem: Clinical Measurements: Goal: Ability to maintain clinical measurements within normal limits will improve Outcome: Progressing Goal: Will remain free from infection Outcome: Progressing Goal: Diagnostic test results will improve Outcome: Progressing Goal: Respiratory complications will improve Outcome: Progressing Goal: Cardiovascular complication will be avoided Outcome: Progressing   Problem: Activity: Goal: Risk for activity intolerance will decrease Outcome: Progressing   Problem: Elimination: Goal: Will not experience complications related to bowel motility Outcome: Progressing   Problem: Education: Goal: Ability to demonstrate management of disease process will improve Outcome: Progressing Goal: Ability to verbalize understanding of medication therapies will improve Outcome: Progressing Goal: Individualized Educational Video(s) Outcome: Progressing   Problem: Activity: Goal: Capacity to carry out activities will improve Outcome: Progressing   Problem: Cardiac: Goal: Ability to achieve and maintain adequate cardiopulmonary perfusion will improve Outcome: Progressing   Problem: Education: Goal: Knowledge of disease or condition will improve Outcome: Progressing Goal: Understanding of medication regimen will improve Outcome: Progressing Goal: Individualized Educational Video(s) Outcome: Progressing   Problem: Activity: Goal: Ability to tolerate increased activity will improve Outcome: Progressing   Problem: Cardiac: Goal: Ability to achieve and maintain adequate cardiopulmonary perfusion will improve Outcome:  Progressing   Problem: Health Behavior/Discharge Planning: Goal: Ability to safely manage health-related needs after discharge will improve Outcome: Progressing

## 2020-11-24 NOTE — Plan of Care (Signed)
Patient provided with discharge information and heart failure packet given. Patient understands importance of medication compliance, fluid restriction and increasing physical activity to maximize heart function.   Problem: Education: Goal: Knowledge of General Education information will improve Description: Including pain rating scale, medication(s)/side effects and non-pharmacologic comfort measures Outcome: Adequate for Discharge   Problem: Health Behavior/Discharge Planning: Goal: Ability to manage health-related needs will improve Outcome: Adequate for Discharge   Problem: Clinical Measurements: Goal: Ability to maintain clinical measurements within normal limits will improve Outcome: Adequate for Discharge Goal: Will remain free from infection Outcome: Adequate for Discharge Goal: Diagnostic test results will improve Outcome: Adequate for Discharge Goal: Respiratory complications will improve Outcome: Adequate for Discharge Goal: Cardiovascular complication will be avoided Outcome: Adequate for Discharge   Problem: Activity: Goal: Risk for activity intolerance will decrease Outcome: Adequate for Discharge   Problem: Elimination: Goal: Will not experience complications related to bowel motility Outcome: Adequate for Discharge   Problem: Education: Goal: Ability to demonstrate management of disease process will improve Outcome: Adequate for Discharge Goal: Ability to verbalize understanding of medication therapies will improve Outcome: Adequate for Discharge Goal: Individualized Educational Video(s) Outcome: Adequate for Discharge   Problem: Activity: Goal: Capacity to carry out activities will improve Outcome: Adequate for Discharge   Problem: Cardiac: Goal: Ability to achieve and maintain adequate cardiopulmonary perfusion will improve Outcome: Adequate for Discharge   Problem: Education: Goal: Knowledge of disease or condition will improve Outcome: Adequate for  Discharge Goal: Understanding of medication regimen will improve Outcome: Adequate for Discharge Goal: Individualized Educational Video(s) Outcome: Adequate for Discharge   Problem: Activity: Goal: Ability to tolerate increased activity will improve Outcome: Adequate for Discharge   Problem: Cardiac: Goal: Ability to achieve and maintain adequate cardiopulmonary perfusion will improve Outcome: Adequate for Discharge   Problem: Health Behavior/Discharge Planning: Goal: Ability to safely manage health-related needs after discharge will improve Outcome: Adequate for Discharge

## 2020-11-24 NOTE — Progress Notes (Signed)
Progress Note  Patient Name: Phillip [name] Date of Encounter: 11/24/2020  [facility] Cardiologist:  Primary is Dr. [name] HF MD is Bensimhon.  Subjective   Sitting up in bed, feeling better.  No shortness of breath.  We discussed fluid restrictions, salt restrictions.  He knows not to overdo chocolate.  Inpatient Medications    Scheduled Meds:  allopurinol  300 mg Oral Daily   amiodarone  400 mg Oral BID   Followed by   Derrill Memo ON 11/30/2020] amiodarone  200 mg Oral Daily   carvedilol  25 mg Oral BID WC   cholecalciferol  2,000 Units Oral Daily   dapagliflozin propanediol  10 mg Oral QAC breakfast   hydrALAZINE  100 mg Oral TID   isosorbide mononitrate  120 mg Oral Daily   mometasone-formoterol  2 puff Inhalation BID   potassium chloride SA  40 mEq Oral BID   rivaroxaban  20 mg Oral Q supper   sodium chloride flush  3 mL Intravenous Q12H   torsemide  60 mg Oral BID   Continuous Infusions:  sodium chloride Stopped (11/23/20 0905)   PRN Meds: sodium chloride, acetaminophen, ondansetron (ZOFRAN) IV, sodium chloride flush   Vital Signs    Vitals:   11/23/20 2356 11/24/20 0349 11/24/20 0545 11/24/20 0756  BP: 124/64 (!) 144/74  (!) 146/80  Pulse: (!) 53 (!) 53 (!) 54 (!) 55  Resp: (!) 22 20  20   Temp: 98.3 F (36.8 C) 97.9 F (36.6 C)  98.3 F (36.8 C)  TempSrc: Oral Oral  Oral  SpO2: 96% 96%  99%  Weight:  (!) 145.8 kg    Height:        Intake/Output Summary (Last 24 hours) at 11/24/2020 0930 Last data filed at 11/24/2020 0730 Gross per 24 hour  Intake 1076.7 ml  Output 1900 ml  Net -823.3 ml   Last 3 Weights 11/24/2020 11/23/2020 11/23/2020  Weight (lbs) 321 lb 6.4 oz 318 lb 320 lb 12.8 oz  Weight (kg) 145.786 kg 144.244 kg 145.514 kg      Telemetry    Sinus rhythm, no further atrial fibrillation heart rate in the 50s while sleeping, 60s to 70s while awake.- Personally Reviewed  ECG    Sinus rhythm, PACs- Personally Reviewed  Physical Exam   GEN: No acute  distress.   Neck: No JVD Cardiac: RRR, no murmurs, rubs, or gallops.  Respiratory: Clear to auscultation bilaterally. GI: Soft, nontender, non-distended  MS: No edema; No deformity. Neuro:  Nonfocal  Psych: Normal affect   Labs    High Sensitivity Troponin:   Recent Labs  Lab 11/21/20 2030 11/21/20 2222  TROPONINIHS 53* 59*      Chemistry Recent Labs  Lab 11/21/20 2030 11/23/20 0527 11/24/20 0228  NA 141 138 140  K 3.8 4.2 4.4  CL 106 106 105  CO2 27 22 26   GLUCOSE 96 91 86  BUN 33* 34* 37*  CREATININE 2.61* 2.80* 3.04*  CALCIUM 9.4 8.7* 8.8*  GFRNONAA 28* 25* 23*  ANIONGAP 8 10 9      Hematology Recent Labs  Lab 11/21/20 2030  WBC 4.1  RBC 4.05*  HGB 11.5*  HCT 37.7*  MCV 93.1  MCH 28.4  MCHC 30.5  RDW 15.9*  PLT 165    BNP Recent Labs  Lab 11/21/20 2030  BNP 949.0*     DDimer  Recent Labs  Lab 11/21/20 2030  DDIMER 0.45     Radiology    ECHOCARDIOGRAM COMPLETE  Result Date: 11/22/2020    ECHOCARDIOGRAM REPORT   Patient Name:   Phillip [name] Date of Exam: 11/22/2020 Medical Rec #:  [id number]  Height:       67.0 in Accession #:    [id number] Weight:       317.0 lb Date of Birth:  [birth year]   BSA:          2.459 m Patient Age:    60 years   BP:           183/84 mmHg Patient Gender: M          HR:           86 bpm. Exam Location:  Inpatient Procedure: 2D Echo, Cardiac Doppler and Color Doppler Indications:    F74.94 Chronic systolic (congestive) heart failure  History:        Patient has prior history of Echocardiogram examinations, most                 recent 08/12/2019. CHF, Arrythmias:Atrial Fibrillation; Risk                 Factors:Hypertension.  Sonographer:    [place] Referring Phys: [id number] [place]  1. Left ventricular ejection fraction, by estimation, is 45 to 50%. The left ventricle has severely decreased function. The left ventricle demonstrates global hypokinesis. The left ventricular internal cavity size was severely dilated.  There is mild left ventricular hypertrophy. Left ventricular diastolic parameters are indeterminate.  2. Right ventricular systolic function is normal. The right ventricular size is normal.  3. Left atrial size was severely dilated.  4. Right atrial size was moderately dilated.  5. The mitral valve is normal in structure. Mild mitral valve regurgitation. No evidence of mitral stenosis.  6. The aortic valve is normal in structure. Aortic valve regurgitation moderate eccentric anterior directed. No aortic stenosis is present.  7. The inferior vena cava is normal in size with greater than 50% respiratory variability, suggesting right atrial pressure of 3 mmHg. Comparison(s): No significant change from prior study. Prior images reviewed side by side. FINDINGS  Left Ventricle: Left ventricular ejection fraction, by estimation, is 45 to 50%. The left ventricle has severely decreased function. The left ventricle demonstrates global hypokinesis. The left ventricular internal cavity size was severely dilated. There is mild left ventricular hypertrophy. Left ventricular diastolic parameters are indeterminate. Right Ventricle: The right ventricular size is normal. No increase in right ventricular wall thickness. Right ventricular systolic function is normal. Left Atrium: Left atrial size was severely dilated. Right Atrium: Right atrial size was moderately dilated. Pericardium: There is no evidence of pericardial effusion. Mitral Valve: The mitral valve is normal in structure. Mild mitral valve regurgitation. No evidence of mitral valve stenosis. Tricuspid Valve: The tricuspid valve is normal in structure. Tricuspid valve regurgitation is not demonstrated. No evidence of tricuspid stenosis. Aortic Valve: The aortic valve is normal in structure. Aortic valve regurgitation moderate eccentric anterior directed. Aortic regurgitation PHT measures 433 msec. No aortic stenosis is present. Aortic valve mean gradient measures 5.0 mmHg.  Aortic valve peak gradient measures 10.8 mmHg. Aortic valve area, by VTI measures 4.60 cm. Pulmonic Valve: The pulmonic valve was normal in structure. Pulmonic valve regurgitation is not visualized. No evidence of pulmonic stenosis. Aorta: The aortic root is normal in size and structure. Venous: The inferior vena cava is normal in size with greater than 50% respiratory variability, suggesting right atrial pressure of 3 mmHg. IAS/Shunts: No atrial level shunt detected by  color flow Doppler.  LEFT VENTRICLE PLAX 2D LVIDd:         7.70 cm      Diastology LVIDs:         6.60 cm      LV e' medial:  8.59 cm/s LV PW:         0.90 cm      LV e' lateral: 9.03 cm/s LV IVS:        1.40 cm LVOT diam:     3.00 cm LV SV:         142 LV SV Index:   58 LVOT Area:     7.07 cm  LV Volumes (MOD) LV vol d, MOD A4C: 176.0 ml LV vol s, MOD A4C: 112.0 ml LV SV MOD A4C:     176.0 ml RIGHT VENTRICLE            IVC RV S prime:     7.62 cm/s  IVC diam: 2.00 cm TAPSE (M-mode): 2.0 cm LEFT ATRIUM            Index       RIGHT ATRIUM           Index LA diam:      5.50 cm  2.24 cm/m  RA Area:     20.50 cm LA Vol (A2C): 143.0 ml 58.14 ml/m RA Volume:   57.40 ml  23.34 ml/m LA Vol (A4C): 68.5 ml  27.85 ml/m  AORTIC VALVE AV Area (Vmax):    4.70 cm AV Area (Vmean):   4.77 cm AV Area (VTI):     4.60 cm AV Vmax:           164.00 cm/s AV Vmean:          106.000 cm/s AV VTI:            0.309 m AV Peak Grad:      10.8 mmHg AV Mean Grad:      5.0 mmHg LVOT Vmax:         109.00 cm/s LVOT Vmean:        71.500 cm/s LVOT VTI:          0.201 m LVOT/AV VTI ratio: 0.65 AI PHT:            433 msec  AORTA Ao Root diam: 3.50 cm Ao Asc diam:  3.70 cm MR Peak grad: 62.1 mmHg MR Vmax:      394.00 cm/s SHUNTS                           Systemic VTI:  0.20 m                           Systemic Diam: 3.00 cm Candee Furbish MD Electronically signed by Candee Furbish MD Signature Date/Time: 11/22/2020/11:50:33 AM    Final     Cardiac Studies   TTE 11/22/2020  1. Left  ventricular ejection fraction, by estimation, is 45 to 50%. The  left ventricle has severely decreased function. The left ventricle  demonstrates global hypokinesis. The left ventricular internal cavity size  was severely dilated. There is mild  left ventricular hypertrophy. Left ventricular diastolic parameters are  indeterminate.   2. Right ventricular systolic function is normal. The right ventricular  size is normal.   3. Left atrial size was severely dilated.   4. Right atrial size was moderately dilated.   5.  The mitral valve is normal in structure. Mild mitral valve  regurgitation. No evidence of mitral stenosis.   6. The aortic valve is normal in structure. Aortic valve regurgitation  moderate eccentric anterior directed. No aortic stenosis is present.   7. The inferior vena cava is normal in size with greater than 50%  respiratory variability, suggesting right atrial pressure of 3 mmHg.   Patient Profile     60 y.o. male with paroxysmal Xarelto, atrial flutter status post ablation, morbid obesity, prior DVT/PE, systolic heart failure (25-95%) CKD stage IV, hypertension who was admitted on 11/22/2018 with acute on chronic systolic heart failure and atrial fibrillation with RVR  Assessment & Plan    Acute on chronic systolic heart failure with ejection fraction of 45 to 50% - When admitted elevated BNP JVD as well as A. fib with RVR - Overall good diuresis.  Has had cardioversion on Xarelto and amiodarone.  He was not a candidate for ablation in the past.  His only option for rhythm control medication was amiodarone. - Now on p.o. amiodarone taper. Eventually 200mg  PO QD.  - His creatinine is slightly increased today to 3 after continuous use of Lasix IV 120 mg daily.  This is up from 2.61-2.80 -Lets go ahead and transition him to p.o. taking torsemide 60 mg twice a day at home, we will place him back on this regimen.  Hopefully with restoration of sinus rhythm this will afford  ongoing compensation of his heart failure.  Persistent atrial fibrillation with rapid ventricular response - Continue with p.o. amiodarone.  Xarelto.  He is not a candidate for Tikosyn per documentation by EP.  Chronic kidney disease stage IV -Creatinine has increased throughout admission with aggressive IV diuresis.  We are now transitioning back to his p.o. torsemide. - Avoid NSAIDs.  Not a candidate for Entresto.  Continue with hydralazine and isosorbide. Has seen nephology.  -Recommend fluid restriction 1 to 1.5 L daily.  Morbid obesity with obstructive sleep apnea - Weight loss recommended.  Encourage CPAP.  Go ahead and discharge.  We will have close clinic follow-up.  For questions or updates, please contact [facility] Please consult [email or web address] for contact info under        Signed, Candee Furbish, MD  11/24/2020, 9:30 AM

## 2020-11-24 NOTE — Progress Notes (Addendum)
Telemetry notified RN of heart rate of 39.Checked on patient, apical rate of 52. Pt asymptomatic. Sleeping with c-pap on at this time.

## 2020-11-24 NOTE — Discharge Summary (Addendum)
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 11/22/2020/11:50:33 AM    Final        Cardiac Studies    TTE 11/22/2020  1. Left ventricular ejection fraction, by estimation, is 45 to 50%. The  left ventricle has severely decreased function. The left ventricle  demonstrates global hypokinesis. The left ventricular internal cavity size  was severely dilated. There is mild  left ventricular hypertrophy. Left ventricular diastolic parameters are  indeterminate.   2. Right ventricular systolic function is normal. The right ventricular  size is normal.   3. Left atrial size was severely dilated.   4. Right atrial size was moderately dilated.   5. The mitral valve is normal in structure. Mild mitral valve  regurgitation. No evidence of mitral stenosis.   6. The aortic valve is normal in structure. Aortic valve regurgitation  moderate  eccentric anterior directed. No aortic stenosis is present.   7. The inferior vena cava is normal in size with greater than 50%  respiratory variability, suggesting right atrial pressure of 3 mmHg.    Patient Profile     60 y.o. male with paroxysmal Xarelto, atrial flutter status post ablation, morbid obesity, prior DVT/PE, systolic heart failure (94-49%) CKD stage IV, hypertension who was admitted on 11/22/2018 with acute on chronic systolic heart failure and atrial fibrillation with RVR   Assessment & Plan    Acute on chronic systolic heart failure with ejection fraction of 45 to 50% - When admitted elevated BNP JVD as well as A. fib with RVR - Overall good diuresis.  Has had cardioversion on Xarelto and amiodarone.  He was not a candidate for ablation in the past.  His only option for rhythm control medication was amiodarone. - Now on p.o. amiodarone taper. Eventually 200mg  PO QD.  - His creatinine is slightly increased today to 3 after continuous use of Lasix IV 120 mg daily.  This is up from 2.61-2.80 -Lets go ahead and transition him to p.o. taking torsemide 60 mg twice a day at home, we will place him back on this regimen.  Hopefully with restoration of sinus rhythm this will afford ongoing compensation of his heart failure.   Persistent  atrial fibrillation with rapid ventricular response - Continue with p.o. amiodarone.  Xarelto.  He is not a candidate for Tikosyn per documentation by EP.   Chronic kidney disease stage IV -Creatinine has increased throughout admission with aggressive IV diuresis.  We are now transitioning back to his p.o. torsemide. - Avoid NSAIDs.  Not a candidate for Entresto.  Continue with hydralazine and isosorbide. Has seen nephology.  -Recommend fluid restriction 1 to 1.5 L daily.   Morbid obesity with obstructive sleep apnea - Weight loss recommended.  Encourage CPAP.   Go ahead and discharge.  We will have close clinic follow-up.   For questions or  updates, please contact [place] Please consult [email or web address] for contact info under         Signed, Candee Furbish, MD

## 2020-11-26 ENCOUNTER — Encounter (HOSPITAL_COMMUNITY): Payer: Self-pay | Admitting: Internal Medicine

## 2020-11-29 ENCOUNTER — Telehealth: Payer: Self-pay

## 2020-11-29 NOTE — Telephone Encounter (Signed)
Spoke to pt about recent hospitalization. He said he is doing well. He is following up with A. Fib clinic and cardiology next week.

## 2020-12-03 NOTE — Progress Notes (Signed)
Advanced Heart Failure Clinic Note   Primary Care: [facility] Primary Cardiologist: [name]  EP: Dr Rayann Heman HF MD: Dr Haroldine Laws   HPI: Phillip [name] is a 60 y.o. male hx of PAF, remote PE, hx of DVT, OSA, morbid obesity, CKD Stage III,  HTN, and  combined chronic systolic/diastolic. S/P A Flutter ablation May 2019 .     EF in 2015 40-45%  Echo 12/21/16 with EF 55-60% G2DD, mild aortic regurgitation.  Last nuc 2008 no ischemia, no hx of cath due to CKD.    Developed AFL and underwent AFL ablation in 5/19.  Follow up with Dr. Haroldine Laws 10/11/18 and was feeling bad. Found to be in AFL/AF in 80s. Echo at that time with EF back down to 30%. Underwent DC-CV on 10/14/18 and referred to AF Clinic.   Developed recurrent AF at the end of January 2021. Underwent attempt at [facility] on 04/22/19 but failed to convert. Seen back in AF Clinic on 2/5. Not able to use Tikosyn due to long QT. Not ablation candidate due to obesity. Considered amiodarone. Started on flecainide but had treadmill to monitor Flecainide Tx.  Patient had significant QRS widening within first minute of exercise.  Flecainide stopped.  Echo 11/20 EF 45-50%  Echo 5/21 EF 45-50%  Follow up 6/22, stable NYHA II-III, volume mildly elevated. No further afib and started on Farxiga.   Admitted 8/31-11/24/20 with A/C CHF with Afib with RVR. He was started on amio gtt and beta blocker increased for rate control. Echo showed EF 45-50%, global hypokinesis, severe LV dilation, normal RV function/size, severe LAE, moderate RAE, and mild MR. He was diuresed with IV lasix and transitioned back to home po torsemide 60 mg bid. Underwent DCCV 11/23/20 with successful restoration of sinus bradycardia following 2 shocks.  He was started on Imdur and transitioned to po amiodarone, given limitation of GDMT with his CKD IV. SCr 3.04 and weight 321 lbs at discharge.  Today he returns for post hospitalization HF follow up. Says he ate chocolate and immediately felt himself  go into afib. No avoids caffeine and chocolate. Walks on TM 7 days/week for about 20 minutes. No SOB with this Denies CP, dizziness, edema, or PND/Orthopnea. Appetite ok. Weight at home 314 pounds. Taking all medications. Wears CPAP every night. Drives a garbage truck.  Review of systems complete and found to be negative unless listed in HPI.    Past Medical History:  Diagnosis Date   Asthma    CHF (congestive heart failure) ([place]) 11/15   EF 40-45%, improved with follow-up   Chronic kidney disease    COPD (chronic obstructive pulmonary disease) (HCC)    DVT (deep venous thrombosis) (HCC)    Gout    Hypertension    Hypertensive cardiovascular disease    Kidney dysfunction    Morbid obesity (HCC)    OSA on CPAP    Paroxysmal atrial fibrillation (HCC)    PNA (pneumonia)    Pulmonary embolism ([place]) 2010   left leg DVT   Sleep apnea    uses cpap   Current Outpatient Medications  Medication Sig Dispense Refill   albuterol (PROAIR HFA) 108 (90 Base) MCG/ACT inhaler Inhale 2 puffs into the lungs every 4 (four) hours as needed for wheezing or shortness of breath. 1 Inhaler 3   albuterol (PROVENTIL) (2.5 MG/3ML) 0.083% nebulizer solution INHALE 1 VIAL VIA NEBULIZER EVERY 6 HOURS AS NEEDED FOR WHEEZING/SHORTNESS OF BREATH. (Patient taking differently: Take 2.5 mg by nebulization every 6 (six) hours  as needed for shortness of breath or wheezing.) 375 mL 11   allopurinol (ZYLOPRIM) 300 MG tablet TAKE 1 TABLET BY MOUTH EVERY DAY (Patient taking differently: Take 300 mg by mouth daily.) 30 tablet 11   amiodarone (PACERONE) 200 MG tablet Take 1 tablet (200 mg total) by mouth daily. 90 tablet 3   budesonide-formoterol (SYMBICORT) 80-4.5 MCG/ACT inhaler Inhale 2 puffs into the lungs 2 (two) times daily. 1 each 0   carvedilol (COREG) 25 MG tablet Take 1 tablet (25 mg total) by mouth 2 (two) times daily with a meal. 180 tablet 3   Cholecalciferol (VITAMIN D3) 50 MCG (2000 UT) TABS Take 2,000 Units by  mouth daily.     colchicine 0.6 MG tablet Take 0.6 mg by mouth daily as needed (gout flares).      dapagliflozin propanediol (FARXIGA) 10 MG TABS tablet Take 1 tablet (10 mg total) by mouth daily before breakfast. 30 tablet 11   hydrALAZINE (APRESOLINE) 100 MG tablet TAKE 1 TABLET THREE TIMES A DAY (CHANGE IN DOSAGE) (Patient taking differently: Take 100 mg by mouth 3 (three) times daily.) 90 tablet 0   isosorbide mononitrate (IMDUR) 120 MG 24 hr tablet Take 1 tablet (120 mg total) by mouth daily. 90 tablet 3   KLOR-CON M20 20 MEQ tablet TAKE 2 TABLETS (40 MEQ) EVERY MORNING AND 1 TABLET (20 MEQ) EVERY EVENING (Patient taking differently: Take 20-40 mEq by mouth See admin instructions. 40 meq in the morning 20 meq in the evening.) 270 tablet 3   oxymetazoline (AFRIN NASAL SPRAY) 0.05 % nasal spray Place 1 spray into both nostrils 2 (two) times daily as needed for congestion.     Probiotic Product (PROBIOTIC DAILY PO) Take 1 capsule by mouth daily.     torsemide (DEMADEX) 20 MG tablet TAKE 3 TABLETS TWICE A DAY (Patient taking differently: Take 60 mg by mouth 2 (two) times daily.) 540 tablet 3   UNABLE TO FIND Med Name: CPAP with sleep     XARELTO 20 MG TABS tablet TAKE 1 TABLET DAILY WITH SUPPER (NEEDS APPOINTMENT FOR FURTHER REFILLS) (Patient taking differently: Take 20 mg by mouth daily with supper.) 90 tablet 3   No current facility-administered medications for this encounter.   Allergies  Allergen Reactions   Other Anaphylaxis    mushrooms   Social History   Socioeconomic History   Marital status: Divorced    Spouse name: Not on file   Number of children: 2   Years of education: 12   Highest education level: Not on file  Occupational History   Occupation: Drives dump truck    Comment:     Occupation: Airline pilot    Comment: Disabled  Tobacco Use   Smoking status: Former    Packs/day: 0.25    Years: 1.00    Pack years: 0.25    Types: Cigarettes    Quit date: 07/23/1989     Years since quitting: 31.3   Smokeless tobacco: Never  Vaping Use   Vaping Use: Never used  Substance and Sexual Activity   Alcohol use: No    Alcohol/week: 0.0 standard drinks   Drug use: No   Sexual activity: Not Currently  Other Topics Concern   Not on file  Social History Narrative   Pt lives in [place], alone.   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   Has 2 sons in [place]      No living will   Would  want sons to make decisions for him   Would accept resuscitation   Social Determinants of Health   Financial Resource Strain: Not on file  Food Insecurity: Not on file  Transportation Needs: Not on file  Physical Activity: Not on file  Stress: Not on file  Social Connections: Not on file  Intimate Partner Violence: Not on file    Family History  Problem Relation Age of Onset   Hypertension Mother    Diabetes Father    CAD Father    Clotting disorder Father    Heart disease Father        transplant   Stomach cancer Father    Allergies Brother    Colon cancer Neg Hx    Colon polyps Neg Hx    Esophageal cancer Neg Hx    Rectal cancer Neg Hx    BP (!) 122/54   Pulse (!) 52   Wt (!) 146.1 kg (322 lb)   SpO2 98%   BMI 47.55 kg/m   Wt Readings from Last 3 Encounters:  12/05/20 (!) 146.1 kg (322 lb)  12/05/20 (!) 145.8 kg (321 lb 6.4 oz)  11/24/20 (!) 145.8 kg (321 lb 6.4 oz)   PHYSICAL EXAM: General:  NAD. No resp difficulty, walked into clinic. HEENT: Normal Neck: Supple. Thick neck. Carotids 2+ bilat; no bruits. No lymphadenopathy or thryomegaly appreciated. Cor: PMI nondisplaced. Brady rate & rhythm. No rubs, gallops or murmurs. Lungs: Clear Abdomen: Obese, nontender, nondistended. No hepatosplenomegaly. No bruits or masses. Good bowel sounds. Extremities: No cyanosis, clubbing, rash, edema Neuro: Alert & oriented x 3, cranial nerves grossly intact. Moves all 4 extremities w/o difficulty. Affect pleasant.  ECG: SB 54  bpm (personally reviewed).  ASSESSMENT & PLAN:  1. Chronic systolic/diastolic HF - Echo (2/77): LVEF 55-60%, Grade 2 DD. - EF (7/20): back down to 30%. Suspect due to AF but rate not overly elevated. HTN may also be playing a role. - Symptoms improved after DC-CV on 10/11/18 . - Echo (11/20): EF 45-50% - Echo (5/21): EF 45-50% - Echo (9/22): EF 45-50% - Stable NYHA II. Volume status good today. - Continue torsemide 60 mg bid. - Continue carvedilol 25 mg bid. - Continue hydralazine 100 mg tid + Imdur 120 mg daily. - Continue Farxiga 10 mg daily. - Has not had cath due to CKD and lack of clear anginal symptoms.  - No ACE/ARB with CKD (creatinine has run 2.5-2.7). - BMET today.  2. PAF / Typical A-flutter, paroxysmal - S/P AFL ablation 07/2017. - s/p DC-CV of AFib on 10/14/18 and 05/10/19 - s/p DCCV of Afib on 11/23/20. - Has been seen in AF Clinic and felt not to be candidate for RFA due to size. - Not candidate for Tikosyn with CKD. Failed flecainide with QT prolongation. - SB on ECG today. - Continue Xarelto 20 mg daily. No bleeding issues. - Continue amiodarone 200 mg daily. - He is compliant with CPAP. Working on weight loss. - CBC today.  3. Hypertension - Much-improved. - Continue current meds.  4. Morbid obesity - Body mass index is 47.55 kg/m.  - Continue weight loss efforts.  5. CKD IIIb - Creatinine baseline ~2.5 -2.8. - Follows with Dr. Candiss Norse. Saw him in 8/22 and creatinine was 2.8. - Continue SGLT2i. - BMET today.  6. OSA on CPAP - Compliant with CPAP.  - Continue with weight loss.  Follow up with Dr. Haroldine Laws in 3 months.  Rafael Bihari, FNP  9:45 AM

## 2020-12-05 ENCOUNTER — Encounter (HOSPITAL_COMMUNITY): Payer: Self-pay

## 2020-12-05 ENCOUNTER — Ambulatory Visit (HOSPITAL_COMMUNITY)
Admission: RE | Admit: 2020-12-05 | Discharge: 2020-12-05 | Disposition: A | Discharge status: Home / Self Care | Payer: Managed Care, Other (non HMO) | Source: Ambulatory Visit | Attending: Nurse Practitioner | Admitting: Nurse Practitioner

## 2020-12-05 ENCOUNTER — Ambulatory Visit: Payer: Managed Care, Other (non HMO) | Admitting: Primary Care

## 2020-12-05 ENCOUNTER — Other Ambulatory Visit: Payer: Self-pay

## 2020-12-05 ENCOUNTER — Ambulatory Visit (HOSPITAL_BASED_OUTPATIENT_CLINIC_OR_DEPARTMENT_OTHER)
Admission: RE | Admit: 2020-12-05 | Discharge: 2020-12-05 | Disposition: A | Discharge status: Home / Self Care | Payer: Managed Care, Other (non HMO) | Source: Ambulatory Visit | Attending: Family Medicine | Admitting: Family Medicine

## 2020-12-05 VITALS — BP 128/56 | HR 53 | Ht 69.0 in | Wt 321.4 lb

## 2020-12-05 VITALS — BP 122/54 | HR 52 | Wt 322.0 lb

## 2020-12-05 DIAGNOSIS — Z87891 Personal history of nicotine dependence: Secondary | ICD-10-CM | POA: Diagnosis not present

## 2020-12-05 DIAGNOSIS — J449 Chronic obstructive pulmonary disease, unspecified: Secondary | ICD-10-CM | POA: Diagnosis not present

## 2020-12-05 DIAGNOSIS — Z6841 Body Mass Index (BMI) 40.0 and over, adult: Secondary | ICD-10-CM | POA: Insufficient documentation

## 2020-12-05 DIAGNOSIS — D6869 Other thrombophilia: Secondary | ICD-10-CM | POA: Diagnosis not present

## 2020-12-05 DIAGNOSIS — I130 Hypertensive heart and chronic kidney disease with heart failure and stage 1 through stage 4 chronic kidney disease, or unspecified chronic kidney disease: Secondary | ICD-10-CM | POA: Diagnosis not present

## 2020-12-05 DIAGNOSIS — Z9989 Dependence on other enabling machines and devices: Secondary | ICD-10-CM

## 2020-12-05 DIAGNOSIS — I480 Paroxysmal atrial fibrillation: Secondary | ICD-10-CM

## 2020-12-05 DIAGNOSIS — Z86718 Personal history of other venous thrombosis and embolism: Secondary | ICD-10-CM | POA: Diagnosis not present

## 2020-12-05 DIAGNOSIS — Z7901 Long term (current) use of anticoagulants: Secondary | ICD-10-CM | POA: Diagnosis not present

## 2020-12-05 DIAGNOSIS — I10 Essential (primary) hypertension: Secondary | ICD-10-CM | POA: Diagnosis not present

## 2020-12-05 DIAGNOSIS — Z86711 Personal history of pulmonary embolism: Secondary | ICD-10-CM | POA: Insufficient documentation

## 2020-12-05 DIAGNOSIS — Z8249 Family history of ischemic heart disease and other diseases of the circulatory system: Secondary | ICD-10-CM | POA: Insufficient documentation

## 2020-12-05 DIAGNOSIS — Z7951 Long term (current) use of inhaled steroids: Secondary | ICD-10-CM | POA: Diagnosis not present

## 2020-12-05 DIAGNOSIS — N1832 Chronic kidney disease, stage 3b: Secondary | ICD-10-CM | POA: Diagnosis not present

## 2020-12-05 DIAGNOSIS — I5042 Chronic combined systolic (congestive) and diastolic (congestive) heart failure: Secondary | ICD-10-CM | POA: Diagnosis not present

## 2020-12-05 DIAGNOSIS — G4733 Obstructive sleep apnea (adult) (pediatric): Secondary | ICD-10-CM | POA: Insufficient documentation

## 2020-12-05 DIAGNOSIS — Z79899 Other long term (current) drug therapy: Secondary | ICD-10-CM | POA: Insufficient documentation

## 2020-12-05 DIAGNOSIS — I5032 Chronic diastolic (congestive) heart failure: Secondary | ICD-10-CM | POA: Diagnosis not present

## 2020-12-05 DIAGNOSIS — I4819 Other persistent atrial fibrillation: Secondary | ICD-10-CM | POA: Insufficient documentation

## 2020-12-05 DIAGNOSIS — N183 Chronic kidney disease, stage 3 unspecified: Secondary | ICD-10-CM

## 2020-12-05 LAB — CBC
HCT: 37.1 % — ABNORMAL LOW (ref 39.0–52.0)
Hemoglobin: 11 g/dL — ABNORMAL LOW (ref 13.0–17.0)
MCH: 27.9 pg (ref 26.0–34.0)
MCHC: 29.6 g/dL — ABNORMAL LOW (ref 30.0–36.0)
MCV: 94.2 fL (ref 80.0–100.0)
Platelets: 156 10*3/uL (ref 150–400)
RBC: 3.94 MIL/uL — ABNORMAL LOW (ref 4.22–5.81)
RDW: 15.2 % (ref 11.5–15.5)
WBC: 3.1 10*3/uL — ABNORMAL LOW (ref 4.0–10.5)
nRBC: 0 % (ref 0.0–0.2)

## 2020-12-05 LAB — BASIC METABOLIC PANEL
Anion gap: 5 (ref 5–15)
BUN: 39 mg/dL — ABNORMAL HIGH (ref 6–20)
CO2: 31 mmol/L (ref 22–32)
Calcium: 9.1 mg/dL (ref 8.9–10.3)
Chloride: 106 mmol/L (ref 98–111)
Creatinine, Ser: 3.35 mg/dL — ABNORMAL HIGH (ref 0.61–1.24)
GFR, Estimated: 20 mL/min — ABNORMAL LOW (ref >60)
Glucose, Bld: 80 mg/dL (ref 70–99)
Potassium: 4.3 mmol/L (ref 3.5–5.1)
Sodium: 142 mmol/L (ref 135–145)
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
## 2020-12-05 NOTE — Patient Instructions (Addendum)
Labs done today. We will contact you only if your labs are abnormal.  No medication changes were made. Please continue all current medications as prescribed.  Your physician recommends that you schedule a follow-up appointment in: 3 months with Dr. Bensimhon   If you have any questions or concerns before your next appointment please send us a message through mychart or call our office at [phone].    TO LEAVE A MESSAGE FOR THE NURSE SELECT OPTION 2, PLEASE LEAVE A MESSAGE INCLUDING: YOUR NAME DATE OF BIRTH CALL BACK NUMBER REASON FOR CALL**this is important as we prioritize the call backs  YOU WILL RECEIVE A CALL BACK THE SAME DAY AS LONG AS YOU CALL BEFORE 4:00 PM   Do the following things EVERYDAY: Weigh yourself in the morning before breakfast. Write it down and keep it in a log. Take your medicines as prescribed Eat low salt foods--Limit salt (sodium) to 2000 mg per day.  Stay as active as you can everyday Limit all fluids for the day to less than 2 liters   At the [facility], you and your health needs are our priority. As part of our continuing mission to provide you with exceptional heart care, we have created designated Provider Care Teams. These Care Teams include your primary Cardiologist (physician) and Advanced Practice Providers (APPs- Physician Assistants and Nurse Practitioners) who all work together to provide you with the care you need, when you need it.   You may see any of the following providers on your designated Care Team at your next follow up: Dr Daniel Bensimhon Dr Dalton McLean Amy Clegg, NP Brittainy Simmons, PA Lauren Kemp, PharmD   Please be sure to bring in all your medications bottles to every appointment.   

## 2020-12-06 ENCOUNTER — Telehealth (HOSPITAL_COMMUNITY): Payer: Self-pay | Admitting: Cardiology

## 2020-12-06 DIAGNOSIS — I5032 Chronic diastolic (congestive) heart failure: Secondary | ICD-10-CM

## 2020-12-06 MED ORDER — TORSEMIDE 20 MG PO TABS: ORAL_TABLET | ORAL | 3 refills | Status: DC

## 2020-12-06 NOTE — Telephone Encounter (Signed)
Patient called.  Patient [phone] (M)

## 2020-12-06 NOTE — Telephone Encounter (Signed)
-----   Message from Rafael Bihari, [place] sent at 12/05/2020  1:08 PM EDT ----- Creatinine elevated from baseline, decrease torsemide to 60 mg am/40 mg pm and repeat BMET in 1 week please.

## 2020-12-09 ENCOUNTER — Other Ambulatory Visit: Payer: Self-pay | Admitting: *Deleted

## 2020-12-09 MED ORDER — BUDESONIDE-FORMOTEROL FUMARATE 80-4.5 MCG/ACT IN AERO: 2.0000 | INHALATION_SPRAY | Freq: Two times a day (BID) | RESPIRATORY_TRACT | 3 refills | Status: DC

## 2020-12-13 ENCOUNTER — Ambulatory Visit (HOSPITAL_COMMUNITY)
Admission: RE | Admit: 2020-12-13 | Discharge: 2020-12-13 | Disposition: A | Discharge status: Home / Self Care | Payer: Managed Care, Other (non HMO) | Source: Ambulatory Visit | Attending: Internal Medicine | Admitting: Internal Medicine

## 2020-12-13 ENCOUNTER — Ambulatory Visit (INDEPENDENT_AMBULATORY_CARE_PROVIDER_SITE_OTHER): Payer: Managed Care, Other (non HMO) | Admitting: Primary Care

## 2020-12-13 ENCOUNTER — Other Ambulatory Visit: Payer: Self-pay

## 2020-12-13 ENCOUNTER — Encounter: Payer: Self-pay | Admitting: Primary Care

## 2020-12-13 DIAGNOSIS — G4733 Obstructive sleep apnea (adult) (pediatric): Secondary | ICD-10-CM | POA: Diagnosis not present

## 2020-12-13 DIAGNOSIS — I5032 Chronic diastolic (congestive) heart failure: Secondary | ICD-10-CM | POA: Diagnosis not present

## 2020-12-13 DIAGNOSIS — Z9989 Dependence on other enabling machines and devices: Secondary | ICD-10-CM | POA: Diagnosis not present

## 2020-12-13 DIAGNOSIS — J4530 Mild persistent asthma, uncomplicated: Secondary | ICD-10-CM

## 2020-12-13 LAB — BASIC METABOLIC PANEL
Anion gap: 8 (ref 5–15)
BUN: 40 mg/dL — ABNORMAL HIGH (ref 6–20)
CO2: 28 mmol/L (ref 22–32)
Calcium: 8.7 mg/dL — ABNORMAL LOW (ref 8.9–10.3)
Chloride: 105 mmol/L (ref 98–111)
Creatinine, Ser: 3.31 mg/dL — ABNORMAL HIGH (ref 0.61–1.24)
GFR, Estimated: 21 mL/min — ABNORMAL LOW (ref >60)
Glucose, Bld: 93 mg/dL (ref 70–99)
Potassium: 3.7 mmol/L (ref 3.5–5.1)
Sodium: 141 mmol/L (ref 135–145)

## 2020-12-13 MED ORDER — BUDESONIDE-FORMOTEROL FUMARATE 80-4.5 MCG/ACT IN AERO: 2.0000 | INHALATION_SPRAY | Freq: Two times a day (BID) | RESPIRATORY_TRACT | 3 refills | Status: DC

## 2020-12-13 NOTE — Assessment & Plan Note (Signed)
-   Patient is 90% compliant with CPAP @ 18cm h20, residual AHI 1.5 - No changes to pressure settings  - Continue to encourage weight loss efforts

## 2020-12-13 NOTE — Assessment & Plan Note (Signed)
-   Stable interval; No recent exacerbation. Rare SABA use. ACT score 23/25.  - Continue Symbicort 1-2 puffs twice daily; PRN albuterol q 6 hours as needed for breakthrough sob/wheezing ([facility]) - Follow-up in 1 year or sooner if needed

## 2020-12-13 NOTE — Patient Instructions (Signed)
Nice meeting you today Phillip [name]  Recommendations: Continue Symbicort 1-2 puffs twice daily (rinse mouth after use) Use albuterol Rescue inhaler 2 puffs every 6 hours as needed for breakthrough shortness of breath/wheezing Continue to wear CPAP every night, no changes needed to pressure setting   Follow-up: 1 year with Dr. Melvyn Novas or sooner if needed    Asthma, Adult Asthma is a long-term (chronic) condition in which the airways get tight and narrow. The airways are the breathing passages that lead from the nose and mouth down into the lungs. A person with asthma will have times when symptoms get worse. These are called asthma attacks. They can cause coughing, whistling sounds when you breathe (wheezing), shortness of breath, and chest pain. They can make it hard to breathe. There is no cure for asthma, but medicines and lifestyle changes can help control it. There are many things that can bring on an asthma attack or make asthma symptoms worse (triggers). Common triggers include: Mold. Dust. Cigarette smoke. Cockroaches. Things that can cause allergy symptoms (allergens). These include animal skin flakes (dander) and pollen from trees or grass. Things that pollute the air. These may include household cleaners, wood smoke, smog, or chemical odors. Cold air, weather changes, and wind. Crying or laughing hard. Stress. Certain medicines or drugs. Certain foods such as dried fruit, potato chips, and grape juice. Infections, such as a cold or the flu. Certain medical conditions or diseases. Exercise or tiring activities. Asthma may be treated with medicines and by staying away from the things that cause asthma attacks. Types of medicines may include: Controller medicines. These help prevent asthma symptoms. They are usually taken every day. Fast-acting reliever or rescue medicines. These quickly relieve asthma symptoms. They are used as needed and provide short-term relief. Allergy medicines if  your attacks are brought on by allergens. Medicines to help control the body's defense (immune) system. Follow these instructions at home: Avoiding triggers in your home Change your heating and air conditioning filter often. Limit your use of fireplaces and wood stoves. Get rid of pests (such as roaches and mice) and their droppings. Throw away plants if you see mold on them. Clean your floors. Dust regularly. Use cleaning products that do not smell. Have someone vacuum when you are not home. Use a vacuum cleaner with a HEPA filter if possible. Replace carpet with wood, tile, or vinyl flooring. Carpet can trap animal skin flakes and dust. Use allergy-proof pillows, mattress covers, and box spring covers. Wash bed sheets and blankets every week in hot water. Dry them in a dryer. Keep your bedroom free of any triggers. Avoid pets and keep windows closed when things that cause allergy symptoms are in the air. Use blankets that are made of polyester or cotton. Clean bathrooms and kitchens with bleach. If possible, have someone repaint the walls in these rooms with mold-resistant paint. Keep out of the rooms that are being cleaned and painted. Wash your hands often with soap and water. If soap and water are not available, use hand sanitizer. Do not allow anyone to smoke in your home. General instructions Take over-the-counter and prescription medicines only as told by your doctor. Talk with your doctor if you have questions about how or when to take your medicines. Make note if you need to use your medicines more often than usual. Do not use any products that contain nicotine or tobacco, such as cigarettes and e-cigarettes. If you need help quitting, ask your doctor. Stay away from secondhand  smoke. Avoid doing things outdoors when allergen counts are high and when air quality is low. Wear a ski mask when doing outdoor activities in the winter. The mask should cover your nose and mouth. Exercise  indoors on cold days if you can. Warm up before you exercise. Take time to cool down after exercise. Use a peak flow meter as told by your doctor. A peak flow meter is a tool that measures how well the lungs are working. Keep track of the peak flow meter's readings. Write them down. Follow your asthma action plan. This is a written plan for taking care of your asthma and treating your attacks. Make sure you get all the shots (vaccines) that your doctor recommends. Ask your doctor about a flu shot and a pneumonia shot. Keep all follow-up visits as told by your doctor. This is important. Contact a doctor if: You have wheezing, shortness of breath, or a cough even while taking medicine to prevent attacks. The mucus you cough up (sputum) is thicker than usual. The mucus you cough up changes from clear or white to yellow, green, gray, or bloody. You have problems from the medicine you are taking, such as: A rash. Itching. Swelling. Trouble breathing. You need reliever medicines more than 2-3 times a week. Your peak flow reading is still at 50-79% of your personal best after following the action plan for 1 hour. You have a fever. Get help right away if: You seem to be worse and are not responding to medicine during an asthma attack. You are short of breath even at rest. You get short of breath when doing very little activity. You have trouble eating, drinking, or talking. You have chest pain or tightness. You have a fast heartbeat. Your lips or fingernails start to turn blue. You are light-headed or dizzy, or you faint. Your peak flow is less than 50% of your personal best. You feel too tired to breathe normally. Summary Asthma is a long-term (chronic) condition in which the airways get tight and narrow. An asthma attack can make it hard to breathe. Asthma cannot be cured, but medicines and lifestyle changes can help control it. Make sure you understand how to avoid triggers and how and  when to use your medicines. This information is not intended to replace advice given to you by your health care provider. Make sure you discuss any questions you have with your health care provider. Document Revised: 07/13/2019 Document Reviewed: 07/13/2019 Elsevier Patient Education  2022 [employer].

## 2020-12-13 NOTE — Progress Notes (Signed)
@Patient  ID: Phillip [name], male    DOB: 1960-12-31, 60 y.o.   MRN: [id number]  Chief Complaint  Patient presents with   Follow-up    Patient reports that he is here for a follow up on OSA and Symbicort.    Referring provider: [name], MD  HPI: 60 year old male, former smoker quit 1991 (0.25-pack-year history).  Past medical history significant for OSA on CPAP, asthma COPD, hypertension, pulm embolism, A. Fib (on chronic anticoagulation), chronic diastolic heart failure, hyperlipidemia, morbid obesity.  Patient of Dr. Melvyn Novas, last seen in office on 03/22/2019.  12/13/2020- Interim hx  Patient presents today for an overdue follow-up for asthma. He is doing well without acute respiratory complaints. He is compliant with Symbicort 69mcg two puffs twice daily. Rarely requires his rescue inhaler. Most of his shortness of breath symptoms came from being in AFIB. He can on occasion notice a difference in his breathing at night if he does not use his maintenance ICS/LABA inhaler. He continues to work on his weight loss journey, he is down 50lbs since 2020. He is focusing on portion control and walking on treadmill.   Medications: Symbicort 80, albuterol HFA/nebulizer Dyspnea: None  Cough: None Sleeping: Patient is 90% compliant with CPAP from 11/11/20-12/10/20 @ 18cm h20, residual AHI 1.5  Saba: Rare use  O2: None ACT score: 23   Allergies  Allergen Reactions   Other Anaphylaxis    mushrooms    Immunization History  Administered Date(s) Administered   Influenza,inj,Quad PF,6+ Mos 05/20/2015   PFIZER(Purple Top)SARS-COV-2 Vaccination 06/15/2019, 07/06/2019   Pneumococcal Conjugate-13 01/28/2017   Pneumococcal Polysaccharide-23 06/22/2015   Tdap 01/28/2017    Past Medical History:  Diagnosis Date   Asthma    CHF (congestive heart failure) ([place]) 11/15   EF 40-45%, improved with follow-up   Chronic kidney disease    COPD (chronic obstructive pulmonary disease) (H[facility])    DVT  (deep venous thrombosis) (HCC)    Gout    Hypertension    Hypertensive cardiovascular disease    Kidney dysfunction    Morbid obesity (HCC)    OSA on CPAP    Paroxysmal atrial fibrillation (HCC)    PNA (pneumonia)    Pulmonary embolism ([place]) 2010   left leg DVT   Sleep apnea    uses cpap    Tobacco History: Social History   Tobacco Use  Smoking Status Former   Packs/day: 0.25   Years: 1.00   Pack years: 0.25   Types: Cigarettes   Quit date: 07/23/1989   Years since quitting: 31.4  Smokeless Tobacco Never   Counseling given: Not Answered   Outpatient Medications Prior to Visit  Medication Sig Dispense Refill   albuterol (PROAIR HFA) 108 (90 Base) MCG/ACT inhaler Inhale 2 puffs into the lungs every 4 (four) hours as needed for wheezing or shortness of breath. 1 Inhaler 3   albuterol (PROVENTIL) (2.5 MG/3ML) 0.083% nebulizer solution INHALE 1 VIAL VIA NEBULIZER EVERY 6 HOURS AS NEEDED FOR WHEEZING/SHORTNESS OF BREATH. (Patient taking differently: Take 2.5 mg by nebulization every 6 (six) hours as needed for shortness of breath or wheezing.) 375 mL 11   allopurinol (ZYLOPRIM) 300 MG tablet TAKE 1 TABLET BY MOUTH EVERY DAY (Patient taking differently: Take 300 mg by mouth daily.) 30 tablet 11   amiodarone (PACERONE) 200 MG tablet Take 1 tablet (200 mg total) by mouth daily. 90 tablet 3   carvedilol (COREG) 25 MG tablet Take 1 tablet (25 mg total) by mouth 2 (  two) times daily with a meal. 180 tablet 3   Cholecalciferol (VITAMIN D3) 50 MCG (2000 UT) TABS Take 2,000 Units by mouth daily.     colchicine 0.6 MG tablet Take 0.6 mg by mouth daily as needed (gout flares).      dapagliflozin propanediol (FARXIGA) 10 MG TABS tablet Take 1 tablet (10 mg total) by mouth daily before breakfast. 30 tablet 11   hydrALAZINE (APRESOLINE) 100 MG tablet TAKE 1 TABLET THREE TIMES A DAY (CHANGE IN DOSAGE) (Patient taking differently: Take 100 mg by mouth 3 (three) times daily.) 90 tablet 0   isosorbide  mononitrate (IMDUR) 120 MG 24 hr tablet Take 1 tablet (120 mg total) by mouth daily. 90 tablet 3   KLOR-CON M20 20 MEQ tablet TAKE 2 TABLETS (40 MEQ) EVERY MORNING AND 1 TABLET (20 MEQ) EVERY EVENING (Patient taking differently: Take 20-40 mEq by mouth See admin instructions. 40 meq in the morning 20 meq in the evening.) 270 tablet 3   oxymetazoline (AFRIN NASAL SPRAY) 0.05 % nasal spray Place 1 spray into both nostrils 2 (two) times daily as needed for congestion.     Probiotic Product (PROBIOTIC DAILY PO) Take 1 capsule by mouth daily.     torsemide (DEMADEX) 20 MG tablet Take 3 tablets (60 mg total) by mouth in the morning AND 2 tablets (40 mg total) every evening. 540 tablet 3   UNABLE TO FIND Med Name: CPAP with sleep     XARELTO 20 MG TABS tablet TAKE 1 TABLET DAILY WITH SUPPER (NEEDS APPOINTMENT FOR FURTHER REFILLS) (Patient taking differently: Take 20 mg by mouth daily with supper.) 90 tablet 3   budesonide-formoterol (SYMBICORT) 80-4.5 MCG/ACT inhaler Inhale 2 puffs into the lungs 2 (two) times daily. 3 each 3   No facility-administered medications prior to visit.      Review of Systems  Review of Systems  Constitutional: Negative.   HENT: Negative.    Respiratory:  Negative for cough, chest tightness, shortness of breath and wheezing.   Cardiovascular: Negative.     Physical Exam  BP 118/60 (BP Location: Left Arm, Patient Position: Sitting, Cuff Size: Large)   Pulse (!) 53   Temp 98.3 F (36.8 C) (Oral)   Ht 5\' 9"  (1.753 m)   Wt (!) 320 lb (145.2 kg)   SpO2 99%   BMI 47.26 kg/m  Physical Exam Constitutional:      Appearance: Normal appearance.  HENT:     Mouth/Throat:     Mouth: Mucous membranes are moist.     Pharynx: Oropharynx is clear.  Cardiovascular:     Rate and Rhythm: Normal rate and regular rhythm.     Comments: RRR Pulmonary:     Effort: Pulmonary effort is normal.     Breath sounds: Normal breath sounds. No wheezing, rhonchi or rales.   Musculoskeletal:        General: Normal range of motion.  Skin:    General: Skin is warm and dry.  Neurological:     General: No focal deficit present.     Mental Status: He is alert and oriented to person, place, and time. Mental status is at baseline.  Psychiatric:        Mood and Affect: Mood normal.        Behavior: Behavior normal.        Thought Content: Thought content normal.        Judgment: Judgment normal.     Lab Results:  CBC  Component Value Date/Time   WBC 3.1 (L) 12/05/2020 1001   RBC 3.94 (L) 12/05/2020 1001   HGB 11.0 (L) 12/05/2020 1001   HCT 37.1 (L) 12/05/2020 1001   PLT 156 12/05/2020 1001   MCV 94.2 12/05/2020 1001   MCH 27.9 12/05/2020 1001   MCHC 29.6 (L) 12/05/2020 1001   RDW 15.2 12/05/2020 1001   LYMPHSABS 0.7 05/13/2019 1706   MONOABS 0.5 05/13/2019 1706   EOSABS 0.1 05/13/2019 1706   BASOSABS 0.0 05/13/2019 1706    BMET    Component Value Date/Time   NA 142 12/05/2020 1001   NA 147 (H) 01/26/2017 1444   K 4.3 12/05/2020 1001   CL 106 12/05/2020 1001   CO2 31 12/05/2020 1001   GLUCOSE 80 12/05/2020 1001   BUN 39 (H) 12/05/2020 1001   BUN 24 01/26/2017 1444   CREATININE 3.35 (H) 12/05/2020 1001   CREATININE 1.79 (H) 02/25/2016 1352   CALCIUM 9.1 12/05/2020 1001   GFRNONAA 20 (L) 12/05/2020 1001   GFRAA 29 (L) 05/23/2019 1121    BNP    Component Value Date/Time   BNP 949.0 (H) 11/21/2020 2030    ProBNP    Component Value Date/Time   PROBNP 877.0 (H) 09/30/2016 1452    Imaging: DG Chest 2 View  Result Date: 11/21/2020 CLINICAL DATA:  Shortness of breath and chest tightness. EXAM: CHEST - 2 VIEW COMPARISON:  May 13, 2019 FINDINGS: There is no evidence of acute infiltrate, pleural effusion or pneumothorax. Mild atelectasis is seen within the left lung base. The cardiac silhouette is mildly enlarged. The visualized skeletal structures are unremarkable. IMPRESSION: Stable cardiomegaly with mild left basilar atelectasis.  Electronically Signed   By: Virgina Norfolk M.D.   On: 11/21/2020 20:55   ECHOCARDIOGRAM COMPLETE  Result Date: 11/22/2020    ECHOCARDIOGRAM REPORT   Patient Name:   MILES LEYDA Date of Exam: 11/22/2020 Medical Rec #:  [id number]  Height:       67.0 in Accession #:    [id number] Weight:       317.0 lb Date of Birth:  06-16-1960   BSA:          2.459 m Patient Age:    6 years   BP:           183/84 mmHg Patient Gender: M          HR:           86 bpm. Exam Location:  Inpatient Procedure: 2D Echo, Cardiac Doppler and Color Doppler Indications:    S92.33 Chronic systolic (congestive) heart failure  History:        Patient has prior history of Echocardiogram examinations, most                 recent 08/12/2019. CHF, Arrythmias:Atrial Fibrillation; Risk                 Factors:Hypertension.  Sonographer:    [place] Referring Phys: [id number] [place]  1. Left ventricular ejection fraction, by estimation, is 45 to 50%. The left ventricle has severely decreased function. The left ventricle demonstrates global hypokinesis. The left ventricular internal cavity size was severely dilated. There is mild left ventricular hypertrophy. Left ventricular diastolic parameters are indeterminate.  2. Right ventricular systolic function is normal. The right ventricular size is normal.  3. Left atrial size was severely dilated.  4. Right atrial size was moderately dilated.  5. The mitral valve is normal in structure. Mild mitral  valve regurgitation. No evidence of mitral stenosis.  6. The aortic valve is normal in structure. Aortic valve regurgitation moderate eccentric anterior directed. No aortic stenosis is present.  7. The inferior vena cava is normal in size with greater than 50% respiratory variability, suggesting right atrial pressure of 3 mmHg. Comparison(s): No significant change from prior study. Prior images reviewed side by side. FINDINGS  Left Ventricle: Left ventricular ejection fraction, by estimation, is 45 to  50%. The left ventricle has severely decreased function. The left ventricle demonstrates global hypokinesis. The left ventricular internal cavity size was severely dilated. There is mild left ventricular hypertrophy. Left ventricular diastolic parameters are indeterminate. Right Ventricle: The right ventricular size is normal. No increase in right ventricular wall thickness. Right ventricular systolic function is normal. Left Atrium: Left atrial size was severely dilated. Right Atrium: Right atrial size was moderately dilated. Pericardium: There is no evidence of pericardial effusion. Mitral Valve: The mitral valve is normal in structure. Mild mitral valve regurgitation. No evidence of mitral valve stenosis. Tricuspid Valve: The tricuspid valve is normal in structure. Tricuspid valve regurgitation is not demonstrated. No evidence of tricuspid stenosis. Aortic Valve: The aortic valve is normal in structure. Aortic valve regurgitation moderate eccentric anterior directed. Aortic regurgitation PHT measures 433 msec. No aortic stenosis is present. Aortic valve mean gradient measures 5.0 mmHg. Aortic valve peak gradient measures 10.8 mmHg. Aortic valve area, by VTI measures 4.60 cm. Pulmonic Valve: The pulmonic valve was normal in structure. Pulmonic valve regurgitation is not visualized. No evidence of pulmonic stenosis. Aorta: The aortic root is normal in size and structure. Venous: The inferior vena cava is normal in size with greater than 50% respiratory variability, suggesting right atrial pressure of 3 mmHg. IAS/Shunts: No atrial level shunt detected by color flow Doppler.  LEFT VENTRICLE PLAX 2D LVIDd:         7.70 cm      Diastology LVIDs:         6.60 cm      LV e' medial:  8.59 cm/s LV PW:         0.90 cm      LV e' lateral: 9.03 cm/s LV IVS:        1.40 cm LVOT diam:     3.00 cm LV SV:         142 LV SV Index:   58 LVOT Area:     7.07 cm  LV Volumes (MOD) LV vol d, MOD A4C: 176.0 ml LV vol s, MOD A4C: 112.0  ml LV SV MOD A4C:     176.0 ml RIGHT VENTRICLE            IVC RV S prime:     7.62 cm/s  IVC diam: 2.00 cm TAPSE (M-mode): 2.0 cm LEFT ATRIUM            Index       RIGHT ATRIUM           Index LA diam:      5.50 cm  2.24 cm/m  RA Area:     20.50 cm LA Vol (A2C): 143.0 ml 58.14 ml/m RA Volume:   57.40 ml  23.34 ml/m LA Vol (A4C): 68.5 ml  27.85 ml/m  AORTIC VALVE AV Area (Vmax):    4.70 cm AV Area (Vmean):   4.77 cm AV Area (VTI):     4.60 cm AV Vmax:           164.00 cm/s AV Vmean:  106.000 cm/s AV VTI:            0.309 m AV Peak Grad:      10.8 mmHg AV Mean Grad:      5.0 mmHg LVOT Vmax:         109.00 cm/s LVOT Vmean:        71.500 cm/s LVOT VTI:          0.201 m LVOT/AV VTI ratio: 0.65 AI PHT:            433 msec  AORTA Ao Root diam: 3.50 cm Ao Asc diam:  3.70 cm MR Peak grad: 62.1 mmHg MR Vmax:      394.00 cm/s SHUNTS                           Systemic VTI:  0.20 m                           Systemic Diam: 3.00 cm Candee Furbish MD Electronically signed by Candee Furbish MD Signature Date/Time: 11/22/2020/11:50:33 AM    Final      Assessment & Plan:   Mild persistent asthma in adult without complication - Stable interval; No recent exacerbation. Rare SABA use. ACT score 23/25.  - Continue Symbicort 1-2 puffs twice daily; PRN albuterol q 6 hours as needed for breakthrough sob/wheezing ([facility]) - Follow-up in 1 year or sooner if needed    OSA on CPAP - Patient is 90% compliant with CPAP @ 18cm h20, residual AHI 1.5 - No changes to pressure settings  - Continue to encourage weight loss efforts    Phillip Ehrich, NP 12/13/2020

## 2020-12-14 ENCOUNTER — Telehealth (HOSPITAL_COMMUNITY): Payer: Self-pay | Admitting: *Deleted

## 2020-12-14 DIAGNOSIS — I5032 Chronic diastolic (congestive) heart failure: Secondary | ICD-10-CM

## 2020-12-14 DIAGNOSIS — N183 Chronic kidney disease, stage 3 unspecified: Secondary | ICD-10-CM

## 2020-12-14 MED ORDER — TORSEMIDE 20 MG PO TABS: 40.0000 mg | ORAL_TABLET | Freq: Two times a day (BID) | ORAL | 3 refills | Status: DC

## 2020-12-14 NOTE — Telephone Encounter (Signed)
I spoke w/pt, he is aware, agreeable, and verbalized understanding, repeat lab sch for 10/3, med list updated

## 2020-12-14 NOTE — Telephone Encounter (Signed)
-----   Message from Rafael Bihari, [place] sent at 12/14/2020  9:35 AM EDT ----- Kidney function remains elevated above baseline. He will need to stop his [place] and further reduce his torsemide to 40 mg bid. Please repeat BMET in 10 days.  He will need to watch for swelling or increase in weight with these medication changes.

## 2020-12-18 ENCOUNTER — Other Ambulatory Visit (HOSPITAL_COMMUNITY): Payer: Self-pay | Admitting: Internal Medicine

## 2020-12-24 ENCOUNTER — Ambulatory Visit (HOSPITAL_COMMUNITY)
Admission: RE | Admit: 2020-12-24 | Discharge: 2020-12-24 | Disposition: A | Discharge status: Home / Self Care | Payer: Managed Care, Other (non HMO) | Source: Ambulatory Visit | Attending: Internal Medicine | Admitting: Internal Medicine

## 2020-12-24 ENCOUNTER — Other Ambulatory Visit: Payer: Self-pay

## 2020-12-24 DIAGNOSIS — I5032 Chronic diastolic (congestive) heart failure: Secondary | ICD-10-CM | POA: Diagnosis not present

## 2020-12-24 DIAGNOSIS — N183 Chronic kidney disease, stage 3 unspecified: Secondary | ICD-10-CM | POA: Insufficient documentation

## 2020-12-24 LAB — BASIC METABOLIC PANEL
Anion gap: 5 (ref 5–15)
BUN: 28 mg/dL — ABNORMAL HIGH (ref 6–20)
CO2: 29 mmol/L (ref 22–32)
Calcium: 8.8 mg/dL — ABNORMAL LOW (ref 8.9–10.3)
Chloride: 107 mmol/L (ref 98–111)
Creatinine, Ser: 2.28 mg/dL — ABNORMAL HIGH (ref 0.61–1.24)
GFR, Estimated: 32 mL/min — ABNORMAL LOW (ref >60)
Glucose, Bld: 77 mg/dL (ref 70–99)
Potassium: 3.9 mmol/L (ref 3.5–5.1)
Sodium: 141 mmol/L (ref 135–145)

## 2021-01-11 ENCOUNTER — Other Ambulatory Visit: Payer: Self-pay | Admitting: Internal Medicine

## 2021-01-23 ENCOUNTER — Other Ambulatory Visit (HOSPITAL_COMMUNITY): Payer: Self-pay | Admitting: Adult Health

## 2021-01-23 DIAGNOSIS — E876 Hypokalemia: Secondary | ICD-10-CM

## 2021-02-17 ENCOUNTER — Emergency Department (HOSPITAL_COMMUNITY)
Admission: EM | Admit: 2021-02-17 | Discharge: 2021-02-17 | Disposition: A | Discharge status: Home / Self Care | Payer: Managed Care, Other (non HMO) | Attending: Emergency Medicine | Admitting: Emergency Medicine

## 2021-02-17 ENCOUNTER — Other Ambulatory Visit: Payer: Self-pay

## 2021-02-17 ENCOUNTER — Encounter (HOSPITAL_COMMUNITY): Payer: Self-pay | Admitting: Pharmacy Technician

## 2021-02-17 ENCOUNTER — Emergency Department (HOSPITAL_COMMUNITY): Payer: Managed Care, Other (non HMO)

## 2021-02-17 DIAGNOSIS — Z20822 Contact with and (suspected) exposure to covid-19: Secondary | ICD-10-CM | POA: Insufficient documentation

## 2021-02-17 DIAGNOSIS — J449 Chronic obstructive pulmonary disease, unspecified: Secondary | ICD-10-CM | POA: Diagnosis not present

## 2021-02-17 DIAGNOSIS — R600 Localized edema: Secondary | ICD-10-CM | POA: Insufficient documentation

## 2021-02-17 DIAGNOSIS — Z7901 Long term (current) use of anticoagulants: Secondary | ICD-10-CM | POA: Diagnosis not present

## 2021-02-17 DIAGNOSIS — Z87891 Personal history of nicotine dependence: Secondary | ICD-10-CM | POA: Insufficient documentation

## 2021-02-17 DIAGNOSIS — I4891 Unspecified atrial fibrillation: Secondary | ICD-10-CM | POA: Insufficient documentation

## 2021-02-17 DIAGNOSIS — J4530 Mild persistent asthma, uncomplicated: Secondary | ICD-10-CM | POA: Diagnosis not present

## 2021-02-17 DIAGNOSIS — Z79899 Other long term (current) drug therapy: Secondary | ICD-10-CM | POA: Insufficient documentation

## 2021-02-17 DIAGNOSIS — I130 Hypertensive heart and chronic kidney disease with heart failure and stage 1 through stage 4 chronic kidney disease, or unspecified chronic kidney disease: Secondary | ICD-10-CM | POA: Diagnosis not present

## 2021-02-17 DIAGNOSIS — I5043 Acute on chronic combined systolic (congestive) and diastolic (congestive) heart failure: Secondary | ICD-10-CM | POA: Diagnosis not present

## 2021-02-17 DIAGNOSIS — Z7951 Long term (current) use of inhaled steroids: Secondary | ICD-10-CM | POA: Diagnosis not present

## 2021-02-17 DIAGNOSIS — N184 Chronic kidney disease, stage 4 (severe): Secondary | ICD-10-CM | POA: Diagnosis not present

## 2021-02-17 DIAGNOSIS — R0602 Shortness of breath: Secondary | ICD-10-CM | POA: Diagnosis present

## 2021-02-17 DIAGNOSIS — I509 Heart failure, unspecified: Secondary | ICD-10-CM

## 2021-02-17 LAB — CBC WITH DIFFERENTIAL/PLATELET
Abs Immature Granulocytes: 0 10*3/uL (ref 0.00–0.07)
Basophils Absolute: 0 10*3/uL (ref 0.0–0.1)
Basophils Relative: 1 %
Eosinophils Absolute: 0.1 10*3/uL (ref 0.0–0.5)
Eosinophils Relative: 2 %
HCT: 33.8 % — ABNORMAL LOW (ref 39.0–52.0)
Hemoglobin: 10.3 g/dL — ABNORMAL LOW (ref 13.0–17.0)
Immature Granulocytes: 0 %
Lymphocytes Relative: 26 %
Lymphs Abs: 0.9 10*3/uL (ref 0.7–4.0)
MCH: 28.3 pg (ref 26.0–34.0)
MCHC: 30.5 g/dL (ref 30.0–36.0)
MCV: 92.9 fL (ref 80.0–100.0)
Monocytes Absolute: 0.3 10*3/uL (ref 0.1–1.0)
Monocytes Relative: 10 %
Neutro Abs: 2.1 10*3/uL (ref 1.7–7.7)
Neutrophils Relative %: 61 %
Platelets: 109 10*3/uL — ABNORMAL LOW (ref 150–400)
RBC: 3.64 MIL/uL — ABNORMAL LOW (ref 4.22–5.81)
RDW: 15.9 % — ABNORMAL HIGH (ref 11.5–15.5)
WBC: 3.4 10*3/uL — ABNORMAL LOW (ref 4.0–10.5)
nRBC: 0 % (ref 0.0–0.2)

## 2021-02-17 LAB — BASIC METABOLIC PANEL
Anion gap: 5 (ref 5–15)
BUN: 35 mg/dL — ABNORMAL HIGH (ref 6–20)
CO2: 30 mmol/L (ref 22–32)
Calcium: 8.9 mg/dL (ref 8.9–10.3)
Chloride: 106 mmol/L (ref 98–111)
Creatinine, Ser: 3.05 mg/dL — ABNORMAL HIGH (ref 0.61–1.24)
GFR, Estimated: 23 mL/min — ABNORMAL LOW (ref >60)
Glucose, Bld: 135 mg/dL — ABNORMAL HIGH (ref 70–99)
Potassium: 3.7 mmol/L (ref 3.5–5.1)
Sodium: 141 mmol/L (ref 135–145)

## 2021-02-17 LAB — RESP PANEL BY RT-PCR (FLU A&B, COVID) ARPGX2
Influenza A by PCR: NEGATIVE
Influenza B by PCR: NEGATIVE
SARS Coronavirus 2 by RT PCR: NEGATIVE

## 2021-02-17 LAB — TROPONIN I (HIGH SENSITIVITY)
Troponin I (High Sensitivity): 36 ng/L — ABNORMAL HIGH (ref <18)
Troponin I (High Sensitivity): 36 ng/L — ABNORMAL HIGH (ref <18)

## 2021-02-17 LAB — BRAIN NATRIURETIC PEPTIDE: B Natriuretic Peptide: 969.1 pg/mL — ABNORMAL HIGH (ref 0.0–100.0)

## 2021-02-17 MED ORDER — FUROSEMIDE 10 MG/ML IJ SOLN
120.0000 mg | Freq: Once | INTRAVENOUS | Status: AC
  Administered 2021-02-17: 22:00:00 120 mg via INTRAVENOUS
  Filled 2021-02-17: qty 2

## 2021-02-17 NOTE — ED Notes (Signed)
Patient verbalizes understanding of discharge instructions. Opportunity for questioning and answers were provided. Armband removed by staff, pt discharged from ED ambulatory.   

## 2021-02-17 NOTE — ED Triage Notes (Signed)
Pt here with worsening shob over the last 1-2 days. Hx CHF. 3 pound weight gain. Bil pitting edema.

## 2021-02-17 NOTE — ED Provider Notes (Signed)
[facility] EMERGENCY DEPARTMENT Provider Note   CSN: [id number] Arrival date & time: 02/17/21  1714     History Chief Complaint  Patient presents with   Shortness of Breath    Phillip [name] is a 60 y.o. male.  He has a history of CHF is on torsemide and follows with Dr. Haroldine Laws.  He went to [place] for the holidays and ate well.  Since then he has noticed increased shortness of breath dyspnea on exertion swelling in his legs and feeling a little bit of tightness in his abdomen.  Denies any fever cough sore throat.  He doubled his diuretic from 48-80 for 2 days with minimal improvement.  No chest pain  The history is provided by the patient.  Shortness of Breath Severity:  Moderate Onset quality:  Gradual Duration:  4 days Timing:  Intermittent Progression:  Unchanged Chronicity:  Recurrent Context: activity   Relieved by:  Nothing Worsened by:  Activity Ineffective treatments:  Diuretics Associated symptoms: no abdominal pain, no chest pain, no cough, no fever, no headaches, no neck pain, no rash, no sore throat, no syncope and no vomiting       Past Medical History:  Diagnosis Date   Asthma    CHF (congestive heart failure) ([place]) 11/15   EF 40-45%, improved with follow-up   Chronic kidney disease    COPD (chronic obstructive pulmonary disease) ([place])    DVT (deep venous thrombosis) (H[facility])    Gout    Hypertension    Hypertensive cardiovascular disease    Kidney dysfunction    Morbid obesity (H[facility])    OSA on CPAP    Paroxysmal atrial fibrillation (H[facility])    PNA (pneumonia)    Pulmonary embolism ([place]) 2010   left leg DVT   Sleep apnea    uses cpap    Patient Active Problem List   Diagnosis Date Noted   Acute on chronic combined systolic and diastolic CHF (congestive heart failure) ([place]) 11/22/2020   Preventative health care 03/09/2020   Benign neoplasm of ascending colon    Hyperlipidemia LDL goal <70 05/15/2019   Atrial fibrillation with rapid  ventricular response ([place]) 06/22/2017   Asthma with COPD ([place])    Chronic diastolic heart failure ([place])    Anticoagulated 08/17/2015   Hx pulmonary embolism 08/17/2015   Deep vein thrombosis (DVT) of lower extremity ([place]) 08/17/2015   Paroxysmal atrial fibrillation ([place]) 08/04/2015   SVT (supraventricular tachycardia) ([place]) 06/22/2015   Morbid obesity ([place]) 03/07/2015   Essential hypertension    Gout    Mild persistent asthma in adult without complication 26/94/8546   OSA on CPAP 05/06/2014   Chronic kidney disease, stage IV (severe) ([place]) 05/06/2014    Past Surgical History:  Procedure Laterality Date   A-FLUTTER ABLATION N/A 07/22/2017   Procedure: A-FLUTTER ABLATION;  Surgeon: Thompson Grayer, MD;  Location: [facility];  Service: Cardiovascular;  Laterality: N/A;   CARDIOVERSION N/A 10/14/2018   Procedure: CARDIOVERSION;  Surgeon: Jolaine Artist, MD;  Location: [facility] ENDOSCOPY;  Service: Cardiovascular;  Laterality: N/A;   CARDIOVERSION N/A 04/22/2019   Procedure: CARDIOVERSION;  Surgeon: Thayer Headings, MD;  Location: [facility] ENDOSCOPY;  Service: Cardiovascular;  Laterality: N/A;   CARDIOVERSION N/A 05/10/2019   Procedure: CARDIOVERSION;  Surgeon: Jolaine Artist, MD;  Location: [facility] ENDOSCOPY;  Service: Cardiovascular;  Laterality: N/A;   CARDIOVERSION N/A 11/23/2020   Procedure: CARDIOVERSION;  Surgeon: Pixie Casino, MD;  Location: [facility];  Service: Cardiovascular;  Laterality: N/A;   COLONOSCOPY WITH PROPOFOL N/A 12/20/2019   Procedure: COLONOSCOPY WITH PROPOFOL;  Surgeon: Yetta Flock, MD;  Location: [facility] ENDOSCOPY;  Service: Gastroenterology;  Laterality: N/A;   POLYPECTOMY  12/20/2019   Procedure: POLYPECTOMY;  Surgeon: Yetta Flock, MD;  Location: [facility] ENDOSCOPY;  Service: Gastroenterology;;   UMBILICAL HERNIA REPAIR  1992       Family History  Problem Relation Age of Onset   Hypertension Mother    Diabetes Father    CAD Father    Clotting  disorder Father    Heart disease Father        transplant   Stomach cancer Father    Allergies Brother    Colon cancer Neg Hx    Colon polyps Neg Hx    Esophageal cancer Neg Hx    Rectal cancer Neg Hx     Social History   Tobacco Use   Smoking status: Former    Packs/day: 0.25    Years: 1.00    Pack years: 0.25    Types: Cigarettes    Quit date: 07/23/1989    Years since quitting: 31.5   Smokeless tobacco: Never  Vaping Use   Vaping Use: Never used  Substance Use Topics   Alcohol use: No    Alcohol/week: 0.0 standard drinks   Drug use: No    Home Medications Prior to Admission medications   Medication Sig Start Date End Date Taking? Authorizing Provider  albuterol (PROAIR HFA) 108 (90 Base) MCG/ACT inhaler Inhale 2 puffs into the lungs every 4 (four) hours as needed for wheezing or shortness of breath. 05/14/18   Tanda Rockers, MD  albuterol (PROVENTIL) (2.5 MG/3ML) 0.083% nebulizer solution INHALE 1 VIAL VIA NEBULIZER EVERY 6 HOURS AS NEEDED FOR WHEEZING/SHORTNESS OF BREATH. Patient taking differently: Take 2.5 mg by nebulization every 6 (six) hours as needed for shortness of breath or wheezing. 05/13/19   [name], MD  allopurinol (ZYLOPRIM) 300 MG tablet TAKE 1 TABLET BY MOUTH EVERY DAY 01/11/21   [name], MD  amiodarone (PACERONE) 200 MG tablet Take 1 tablet (200 mg total) by mouth daily. 12/02/20   Kroeger, Lorelee Cover., PA-C  budesonide-formoterol (SYMBICORT) 80-4.5 MCG/ACT inhaler Inhale 2 puffs into the lungs 2 (two) times daily. 12/13/20   Martyn Ehrich, NP  carvedilol (COREG) 25 MG tablet Take 1 tablet (25 mg total) by mouth 2 (two) times daily with a meal. 11/24/20   Kroeger, Lorelee Cover., PA-C  Cholecalciferol (VITAMIN D3) 50 MCG (2000 UT) TABS Take 2,000 Units by mouth daily.    [provider]  colchicine 0.6 MG tablet Take 0.6 mg by mouth daily as needed (gout flares).  07/25/16   [provider]  hydrALAZINE (APRESOLINE) 100 MG  tablet TAKE 1 TABLET THREE TIMES A DAY (CHANGE IN DOSAGE) Patient taking differently: Take 100 mg by mouth 3 (three) times daily. 06/04/20   Bensimhon, Shaune Pascal, MD  isosorbide mononitrate (IMDUR) 120 MG 24 hr tablet Take 1 tablet (120 mg total) by mouth daily. 11/25/20   Kroeger, Daleen Snook M., PA-C  KLOR-CON M20 20 MEQ tablet TAKE 2 TABLETS (40 MEQ) EVERY MORNING AND 1 TABLET (20 MEQ) EVERY EVENING 01/25/21   Bensimhon, Shaune Pascal, MD  oxymetazoline (AFRIN NASAL SPRAY) 0.05 % nasal spray Place 1 spray into both nostrils 2 (two) times daily as needed for congestion. 03/12/17   Oswald Hillock, MD  Probiotic Product (PROBIOTIC DAILY PO) Take 1 capsule by mouth daily.  [provider]  torsemide (DEMADEX) 20 MG tablet Take 2 tablets (40 mg total) by mouth 2 (two) times daily. 12/14/20   [place]ford, Maricela Bo, FNP  UNABLE TO FIND Med Name: CPAP with sleep    [provider]  XARELTO 20 MG TABS tablet TAKE 1 TABLET DAILY WITH SUPPER (NEEDS APPOINTMENT FOR FURTHER REFILLS) Patient taking differently: Take 20 mg by mouth daily with supper. 09/21/20   Bensimhon, Shaune Pascal, MD    Allergies    Other  Review of Systems   Review of Systems  Constitutional:  Negative for fever.  HENT:  Negative for sore throat.   Eyes:  Negative for visual disturbance.  Respiratory:  Positive for shortness of breath. Negative for cough.   Cardiovascular:  Positive for leg swelling. Negative for chest pain and syncope.  Gastrointestinal:  Negative for abdominal pain and vomiting.  Genitourinary:  Negative for dysuria.  Musculoskeletal:  Negative for neck pain.  Skin:  Negative for rash.  Neurological:  Negative for headaches.   Physical Exam Updated Vital Signs BP (!) 138/53 (BP Location: Left Arm)   Pulse (!) 45   Temp 98 F (36.7 C) (Oral)   Resp 18   SpO2 100%   Physical Exam Vitals and nursing note reviewed.  Constitutional:      General: He is not in acute distress.    Appearance: He is  well-developed.  HENT:     Head: Normocephalic and atraumatic.  Eyes:     Conjunctiva/sclera: Conjunctivae normal.  Cardiovascular:     Rate and Rhythm: Normal rate and regular rhythm.     Heart sounds: No murmur heard. Pulmonary:     Effort: Pulmonary effort is normal. No respiratory distress.     Breath sounds: Normal breath sounds.  Abdominal:     Palpations: Abdomen is soft.     Tenderness: There is no abdominal tenderness.  Musculoskeletal:        General: No swelling.     Cervical back: Neck supple.     Right lower leg: No tenderness. Edema present.     Left lower leg: No tenderness. Edema present.  Skin:    General: Skin is warm and dry.     Capillary Refill: Capillary refill takes less than 2 seconds.  Neurological:     General: No focal deficit present.     Mental Status: He is alert.  Psychiatric:        Mood and Affect: Mood normal.    ED Results / Procedures / Treatments   Labs (all labs ordered are listed, but only abnormal results are displayed) Labs Reviewed  CBC WITH DIFFERENTIAL/PLATELET - Abnormal; Notable for the following components:      Result Value   WBC 3.4 (*)    RBC 3.64 (*)    Hemoglobin 10.3 (*)    HCT 33.8 (*)    RDW 15.9 (*)    Platelets 109 (*)    All other components within normal limits  BASIC METABOLIC PANEL - Abnormal; Notable for the following components:   Glucose, Bld 135 (*)    BUN 35 (*)    Creatinine, Ser 3.05 (*)    GFR, Estimated 23 (*)    All other components within normal limits  BRAIN NATRIURETIC PEPTIDE - Abnormal; Notable for the following components:   B Natriuretic Peptide 969.1 (*)    All other components within normal limits  TROPONIN I (HIGH SENSITIVITY) - Abnormal; Notable for the following components:   Troponin  I (High Sensitivity) 36 (*)    All other components within normal limits  RESP PANEL BY RT-PCR (FLU A&B, COVID) ARPGX2  TROPONIN I (HIGH SENSITIVITY)    EKG None EKG did not cross into epic.   Sinus bradycardia rate of 53 nonspecific T wave abnormalities no acute ST-T changes.    Radiology DG Chest 2 View  Result Date: 02/17/2021 CLINICAL DATA:  Shortness of breath. EXAM: CHEST - 2 VIEW COMPARISON:  Chest x-ray 05/13/2019. FINDINGS: The heart is enlarged, unchanged. The lungs are clear. There is no pleural effusion or pneumothorax. No acute fractures are seen. IMPRESSION: 1. No acute cardiopulmonary process. 2. Stable cardiomegaly. Electronically Signed   By: Ronney Asters M.D.   On: 02/17/2021 18:41    Procedures Procedures   Medications Ordered in ED Medications  furosemide (LASIX) 120 mg in dextrose 5 % 50 mL IVPB (0 mg Intravenous Stopped 02/17/21 2305)    ED Course  I have reviewed the triage vital signs and the nursing notes.  Pertinent labs & imaging results that were available during my care of the patient were reviewed by me and considered in my medical decision making (see chart for details).  Clinical Course as of 02/17/21 2254  Nancy Fetter Feb 17, 2021  2137 Reviewed case with cardiology Dr.Narcisse.  He recommends 120 of Lasix here and have him continue the 80 of torsemide until he is back to his baseline weight and follow-up with the clinic.  Will review with patient. [MB]    Clinical Course User Index [MB] Hayden Rasmussen, MD   MDM Rules/Calculators/A&P                          This patient complains of increased shortness of breath dyspnea on exertion leg and abdominal swelling; this involves an extensive number of treatment Options and is a complaint that carries with it a high risk of complications and Morbidity. The differential includes CHF, pneumonia, COVID, flu, ACS, renal failure, metabolic derangement  I ordered, reviewed and interpreted labs, which included CBC with mildly low white count stable hemoglobin, chemistries fairly normal other than elevated BUN and creatinine slightly worse than priors, BNP elevated to stable from priors, troponins  slightly elevated but flat, COVID and flu test negative  I ordered medication IV Lasix I ordered imaging studies which included chest x-ray and I independently    visualized and interpreted imaging which showed cardiomegaly stable  Previous records obtained and reviewed in epic including prior cardiology notes I consulted cardiology Dr. Marcelle Smiling And discussed lab and imaging findings  Critical Interventions: None  After the interventions stated above, I reevaluated the patient and found patient be minimally symptomatic at rest.  Satting 100% on room air laying down comfortably in the bed.  Reviewed cardiology recommendations and patient is comfortable plan for IV diuretic here and increasing his diuretic until follow-up with heart failure clinic.  Return instructions discussed   Final Clinical Impression(s) / ED Diagnoses Final diagnoses:  Acute on chronic congestive heart failure, unspecified heart failure type [facility])    Rx / DC Orders ED Discharge Orders     None        Hayden Rasmussen, MD 02/18/21 1048

## 2021-02-17 NOTE — Discharge Instructions (Signed)
Cardiology is recommending that you double your torsemide to 80 mg daily until you are back in your ideal weight.  Call the heart failure clinic tomorrow to set up a follow-up appointment.  Low-salt diet.  Continue your regular medications.  Return to the emergency department if any worsening or concerning symptoms

## 2021-02-17 NOTE — ED Provider Notes (Signed)
Emergency Medicine Provider Triage Evaluation Note  Phillip [name] , [name] 60 y.o. male  was evaluated in triage.  Pt complains of shortness of breath, lower extremity edema.  Up 3 pounds from baseline.  Taking torsemide, compliant.  Cough.  Sleeps with 2 pillows at night.  Shortness of breath worse with exertion.  Has [name] tightness sensation across chest however Denies any actual pain.  No back pain.  No known sick contact  Review of Systems  Positive: Le edema, sob, cough Negative: Back pain, fever, emesis  Physical Exam  BP 121/63 (BP Location: Left Arm)   Pulse (!) 57   Temp 98.5 F (36.9 C)   Resp 16   SpO2 100%  Gen:   Awake, no distress   Resp:  Normal effort  MSK:   Moves extremities without difficulty, le edema Other:    Medical Decision Making  Medically screening exam initiated at 6:02 PM.  Appropriate orders placed.  Phillip [name] was informed that the remainder of the evaluation will be completed by another provider, this initial triage assessment does not replace that evaluation, and the importance of remaining in the ED until their evaluation is complete.  Sob, le edema, chest tightness   Phillip Darrow A, PA-C 02/17/21 1803    [name], MD 02/17/21 2106

## 2021-02-18 NOTE — Consult Note (Signed)
I was called to discuss the care plan for this patient, who is seen in clinic by Dr. Haroldine Laws. Per ED provider, he had travelled for the holidays and had poor dietary adherence resulting in fluid retention/weight gain. Per recs, he had doubled his dose of torsemide at home. However, noted that he was not losing weight adequately. He then presented to the ED for further evaluation. He was hemodynamically stable with slight elevation in creatinine (poor at baseline). Otherwise, he was stable.  I recommended giving one dose of IV lasix 120 mg and if he responds appropriately then it would be reasonable to discharge home and continuing taking double torsemide dose. He was instructed to call for close follow up with Dr. Haroldine Laws. He was also given strict return precautions by the ED providers.   Phillip Chang, MD Cardiology

## 2021-02-21 ENCOUNTER — Other Ambulatory Visit: Payer: Self-pay | Admitting: Internal Medicine

## 2021-02-22 ENCOUNTER — Telehealth: Payer: Self-pay | Admitting: Internal Medicine

## 2021-02-22 MED ORDER — ALBUTEROL SULFATE (2.5 MG/3ML) 0.083% IN NEBU: INHALATION_SOLUTION | RESPIRATORY_TRACT | 11 refills | Status: DC

## 2021-02-22 NOTE — Telephone Encounter (Signed)
Rx for pt's albuterol sol has been sent to preferred pharmacy for pt. Nothing further needed.

## 2021-02-23 ENCOUNTER — Emergency Department (HOSPITAL_COMMUNITY)
Admission: EM | Admit: 2021-02-23 | Discharge: 2021-02-23 | Disposition: A | Discharge status: Home / Self Care | Payer: Managed Care, Other (non HMO) | Attending: Emergency Medicine | Admitting: Emergency Medicine

## 2021-02-23 ENCOUNTER — Emergency Department (HOSPITAL_COMMUNITY): Payer: Managed Care, Other (non HMO)

## 2021-02-23 ENCOUNTER — Encounter (HOSPITAL_COMMUNITY): Payer: Self-pay

## 2021-02-23 ENCOUNTER — Other Ambulatory Visit: Payer: Self-pay

## 2021-02-23 DIAGNOSIS — R059 Cough, unspecified: Secondary | ICD-10-CM | POA: Diagnosis not present

## 2021-02-23 DIAGNOSIS — R0981 Nasal congestion: Secondary | ICD-10-CM | POA: Insufficient documentation

## 2021-02-23 DIAGNOSIS — Z5321 Procedure and treatment not carried out due to patient leaving prior to being seen by health care provider: Secondary | ICD-10-CM | POA: Insufficient documentation

## 2021-02-23 LAB — BASIC METABOLIC PANEL
Anion gap: 9 (ref 5–15)
BUN: 40 mg/dL — ABNORMAL HIGH (ref 6–20)
CO2: 27 mmol/L (ref 22–32)
Calcium: 8.9 mg/dL (ref 8.9–10.3)
Chloride: 103 mmol/L (ref 98–111)
Creatinine, Ser: 2.81 mg/dL — ABNORMAL HIGH (ref 0.61–1.24)
GFR, Estimated: 25 mL/min — ABNORMAL LOW (ref >60)
Glucose, Bld: 88 mg/dL (ref 70–99)
Potassium: 3.7 mmol/L (ref 3.5–5.1)
Sodium: 139 mmol/L (ref 135–145)

## 2021-02-23 LAB — CBC
HCT: 35.5 % — ABNORMAL LOW (ref 39.0–52.0)
Hemoglobin: 11.1 g/dL — ABNORMAL LOW (ref 13.0–17.0)
MCH: 28.7 pg (ref 26.0–34.0)
MCHC: 31.3 g/dL (ref 30.0–36.0)
MCV: 91.7 fL (ref 80.0–100.0)
Platelets: 126 10*3/uL — ABNORMAL LOW (ref 150–400)
RBC: 3.87 MIL/uL — ABNORMAL LOW (ref 4.22–5.81)
RDW: 15.9 % — ABNORMAL HIGH (ref 11.5–15.5)
WBC: 3.3 10*3/uL — ABNORMAL LOW (ref 4.0–10.5)
nRBC: 0 % (ref 0.0–0.2)

## 2021-02-23 LAB — TROPONIN I (HIGH SENSITIVITY): Troponin I (High Sensitivity): 35 ng/L — ABNORMAL HIGH (ref <18)

## 2021-02-23 NOTE — ED Triage Notes (Signed)
Patient said he had "head congestion" that started Sunday. He went to [facility] Sunday night and got the fluid off of his lungs and legs, he got lasix. When he coughs, it is tight. He tested negative for the flu and Covid.

## 2021-02-26 ENCOUNTER — Ambulatory Visit: Payer: Managed Care, Other (non HMO) | Admitting: Internal Medicine

## 2021-02-26 ENCOUNTER — Other Ambulatory Visit: Payer: Self-pay

## 2021-02-26 ENCOUNTER — Encounter: Payer: Self-pay | Admitting: Internal Medicine

## 2021-02-26 DIAGNOSIS — I5043 Acute on chronic combined systolic (congestive) and diastolic (congestive) heart failure: Secondary | ICD-10-CM | POA: Diagnosis not present

## 2021-02-26 DIAGNOSIS — J4520 Mild intermittent asthma, uncomplicated: Secondary | ICD-10-CM

## 2021-02-26 DIAGNOSIS — J45909 Unspecified asthma, uncomplicated: Secondary | ICD-10-CM | POA: Insufficient documentation

## 2021-02-26 MED ORDER — AMOXICILLIN-POT CLAVULANATE 875-125 MG PO TABS: 1.0000 | ORAL_TABLET | Freq: Two times a day (BID) | ORAL | 0 refills | Status: DC

## 2021-02-26 MED ORDER — PREDNISONE 20 MG PO TABS: 40.0000 mg | ORAL_TABLET | Freq: Every day | ORAL | 0 refills | Status: DC

## 2021-02-26 NOTE — Progress Notes (Signed)
Subjective:    Patient ID: Phillip [name], male    DOB: 12/16/1960, 60 y.o.   MRN: [id number]  HPI Here for ER follow up for chest congestion This visit occurred during the SARS-CoV-2 public health emergency.  Safety protocols were in place, including screening questions prior to the visit, additional usage of staff PPE, and extensive cleaning of exam room while observing appropriate contact time as indicated for disinfecting solutions.   In ER 11/27----increased fluid after away for Thanksgiving Got 120mg  IV lasix and he did diurese with this Weight went down--and he seemed better  Had head cold---started the next day 11/28 Now draining into chest Cough--some sputum (slight color) No fever No chills or sweats Breathing mostly good---until trying to help push a car (then SOB) Now has albuterol for nebulizer--not clearly helping No headache or ear pain "Clots" of phlegm when coughing No sore throat  Daily weights---- 315.6# this morning This is okay (dry weight under 318#)  COVID/ flu tests negative Delsym for cough---may help slightly  Current Outpatient Medications on File Prior to Visit  Medication Sig Dispense Refill   albuterol (PROAIR HFA) 108 (90 Base) MCG/ACT inhaler Inhale 2 puffs into the lungs every 4 (four) hours as needed for wheezing or shortness of breath. 1 Inhaler 3   albuterol (PROVENTIL) (2.5 MG/3ML) 0.083% nebulizer solution INHALE 1 VIAL VIA NEBULIZER EVERY 6 HOURS AS NEEDED FOR WHEEZING/SHORTNESS OF BREATH. 375 mL 11   allopurinol (ZYLOPRIM) 300 MG tablet TAKE 1 TABLET BY MOUTH EVERY DAY 90 tablet 0   amiodarone (PACERONE) 200 MG tablet Take 1 tablet (200 mg total) by mouth daily. 90 tablet 3   budesonide-formoterol (SYMBICORT) 80-4.5 MCG/ACT inhaler Inhale 2 puffs into the lungs 2 (two) times daily. 3 each 3   carvedilol (COREG) 25 MG tablet Take 1 tablet (25 mg total) by mouth 2 (two) times daily with a meal. 180 tablet 3   Cholecalciferol (VITAMIN D3) 50 MCG  (2000 UT) TABS Take 2,000 Units by mouth daily.     colchicine 0.6 MG tablet Take 0.6 mg by mouth daily as needed (gout flares).      hydrALAZINE (APRESOLINE) 100 MG tablet TAKE 1 TABLET THREE TIMES A DAY (CHANGE IN DOSAGE) (Patient taking differently: Take 100 mg by mouth 3 (three) times daily.) 90 tablet 0   isosorbide mononitrate (IMDUR) 120 MG 24 hr tablet Take 1 tablet (120 mg total) by mouth daily. 90 tablet 3   KLOR-CON M20 20 MEQ tablet TAKE 2 TABLETS (40 MEQ) EVERY MORNING AND 1 TABLET (20 MEQ) EVERY EVENING 270 tablet 3   oxymetazoline (AFRIN NASAL SPRAY) 0.05 % nasal spray Place 1 spray into both nostrils 2 (two) times daily as needed for congestion.     Probiotic Product (PROBIOTIC DAILY PO) Take 1 capsule by mouth daily.     torsemide (DEMADEX) 20 MG tablet Take 2 tablets (40 mg total) by mouth 2 (two) times daily. 540 tablet 3   UNABLE TO FIND Med Name: CPAP with sleep     XARELTO 20 MG TABS tablet TAKE 1 TABLET DAILY WITH SUPPER (NEEDS APPOINTMENT FOR FURTHER REFILLS) (Patient taking differently: Take 20 mg by mouth daily with supper.) 90 tablet 3   No current facility-administered medications on file prior to visit.    Allergies  Allergen Reactions   Other Anaphylaxis    mushrooms    Past Medical History:  Diagnosis Date   Asthma    CHF (congestive heart failure) ([place]) 11/15  EF 40-45%, improved with follow-up   Chronic kidney disease    COPD (chronic obstructive pulmonary disease) ([facility])    DVT (deep venous thrombosis) ([facility])    Gout    Hypertension    Hypertensive cardiovascular disease    Kidney dysfunction    Morbid obesity (HCC)    OSA on CPAP    Paroxysmal atrial fibrillation ([facility])    PNA (pneumonia)    Pulmonary embolism ([place]) 2010   left leg DVT   Sleep apnea    uses cpap    Past Surgical History:  Procedure Laterality Date   A-FLUTTER ABLATION N/A 07/22/2017   Procedure: A-FLUTTER ABLATION;  Surgeon: Thompson Grayer, MD;  Location: [facility];   Service: Cardiovascular;  Laterality: N/A;   CARDIOVERSION N/A 10/14/2018   Procedure: CARDIOVERSION;  Surgeon: Jolaine Artist, MD;  Location: [facility];  Service: Cardiovascular;  Laterality: N/A;   CARDIOVERSION N/A 04/22/2019   Procedure: CARDIOVERSION;  Surgeon: Thayer Headings, MD;  Location: [facility] ENDOSCOPY;  Service: Cardiovascular;  Laterality: N/A;   CARDIOVERSION N/A 05/10/2019   Procedure: CARDIOVERSION;  Surgeon: Jolaine Artist, MD;  Location: [facility];  Service: Cardiovascular;  Laterality: N/A;   CARDIOVERSION N/A 11/23/2020   Procedure: CARDIOVERSION;  Surgeon: Pixie Casino, MD;  Location: [facility] ENDOSCOPY;  Service: Cardiovascular;  Laterality: N/A;   COLONOSCOPY WITH PROPOFOL N/A 12/20/2019   Procedure: COLONOSCOPY WITH PROPOFOL;  Surgeon: Yetta Flock, MD;  Location: [facility] ENDOSCOPY;  Service: Gastroenterology;  Laterality: N/A;   POLYPECTOMY  12/20/2019   Procedure: POLYPECTOMY;  Surgeon: Yetta Flock, MD;  Location: [facility] ENDOSCOPY;  Service: Gastroenterology;;   UMBILICAL HERNIA REPAIR  1992    Family History  Problem Relation Age of Onset   Hypertension Mother    Diabetes Father    CAD Father    Clotting disorder Father    Heart disease Father        transplant   Stomach cancer Father    Allergies Brother    Colon cancer Neg Hx    Colon polyps Neg Hx    Esophageal cancer Neg Hx    Rectal cancer Neg Hx     Social History   Socioeconomic History   Marital status: Divorced    Spouse name: Not on file   Number of children: 2   Years of education: 12   Highest education level: Not on file  Occupational History   Occupation: Drives dump truck    Comment:     Occupation: Airline pilot    Comment: Disabled  Tobacco Use   Smoking status: Former    Packs/day: 0.25    Years: 1.00    Pack years: 0.25    Types: Cigarettes    Quit date: 07/23/1989    Years since quitting: 31.6   Smokeless tobacco: Never  Vaping Use   Vaping Use: Never used   Substance and Sexual Activity   Alcohol use: No    Alcohol/week: 0.0 standard drinks   Drug use: No   Sexual activity: Not Currently  Other Topics Concern   Not on file  Social History Narrative   Pt lives in [place], alone.   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   Has 2 sons in [place]      No living will   Would want sons to make decisions for him   Would accept resuscitation   Social Determinants of Health   Financial Resource Strain: Not  on file  Food Insecurity: Not on file  Transportation Needs: Not on file  Physical Activity: Not on file  Stress: Not on file  Social Connections: Not on file  Intimate Partner Violence: Not on file   Review of Systems No N/V Appetite is okay Occasionally stomach gets tight---better after moving bowels (now using metamucil)     Objective:   Physical Exam Constitutional:      Appearance: Normal appearance.  HENT:     Head:     Comments: No sinus tenderness    Right Ear: Tympanic membrane and ear canal normal.     Left Ear: Tympanic membrane and ear canal normal.     Nose:     Comments: Moderate nasal congestion but not inflamed    Mouth/Throat:     Pharynx: No oropharyngeal exudate or posterior oropharyngeal erythema.  Cardiovascular:     Rate and Rhythm: Normal rate and regular rhythm.     Heart sounds: No murmur heard.   No gallop.  Pulmonary:     Effort: Pulmonary effort is normal. No respiratory distress.     Breath sounds: No rales.     Comments: Fair air movement Some mild expiratory prolongation---mild exp wheezes Musculoskeletal:     Cervical back: Neck supple.  Lymphadenopathy:     Cervical: No cervical adenopathy.  Neurological:     Mental Status: He is alert.           Assessment & Plan:

## 2021-02-26 NOTE — Assessment & Plan Note (Signed)
This is better since the IV diuresis in the ER last week He monitors it every day

## 2021-02-26 NOTE — Assessment & Plan Note (Signed)
Will continue the albuterol nebs Prednisone for 3 days augmentin

## 2021-03-06 ENCOUNTER — Other Ambulatory Visit: Payer: Self-pay

## 2021-03-06 ENCOUNTER — Encounter (HOSPITAL_COMMUNITY): Payer: Self-pay | Admitting: Nurse Practitioner

## 2021-03-06 ENCOUNTER — Ambulatory Visit (HOSPITAL_BASED_OUTPATIENT_CLINIC_OR_DEPARTMENT_OTHER)
Admission: RE | Admit: 2021-03-06 | Discharge: 2021-03-06 | Disposition: A | Discharge status: Home / Self Care | Payer: Managed Care, Other (non HMO) | Source: Ambulatory Visit | Attending: Internal Medicine | Admitting: Internal Medicine

## 2021-03-06 ENCOUNTER — Ambulatory Visit (HOSPITAL_COMMUNITY)
Admission: RE | Admit: 2021-03-06 | Discharge: 2021-03-06 | Disposition: A | Discharge status: Home / Self Care | Payer: Managed Care, Other (non HMO) | Source: Ambulatory Visit | Attending: Nurse Practitioner | Admitting: Nurse Practitioner

## 2021-03-06 VITALS — BP 130/60 | HR 63 | Wt 320.0 lb

## 2021-03-06 VITALS — BP 130/60 | HR 63 | Ht 69.0 in | Wt 320.4 lb

## 2021-03-06 DIAGNOSIS — I4892 Unspecified atrial flutter: Secondary | ICD-10-CM | POA: Insufficient documentation

## 2021-03-06 DIAGNOSIS — I509 Heart failure, unspecified: Secondary | ICD-10-CM | POA: Diagnosis not present

## 2021-03-06 DIAGNOSIS — I4819 Other persistent atrial fibrillation: Secondary | ICD-10-CM | POA: Diagnosis not present

## 2021-03-06 DIAGNOSIS — I5032 Chronic diastolic (congestive) heart failure: Secondary | ICD-10-CM

## 2021-03-06 DIAGNOSIS — I10 Essential (primary) hypertension: Secondary | ICD-10-CM

## 2021-03-06 DIAGNOSIS — I480 Paroxysmal atrial fibrillation: Secondary | ICD-10-CM

## 2021-03-06 DIAGNOSIS — G4733 Obstructive sleep apnea (adult) (pediatric): Secondary | ICD-10-CM

## 2021-03-06 DIAGNOSIS — Z86718 Personal history of other venous thrombosis and embolism: Secondary | ICD-10-CM | POA: Diagnosis not present

## 2021-03-06 DIAGNOSIS — N189 Chronic kidney disease, unspecified: Secondary | ICD-10-CM | POA: Diagnosis not present

## 2021-03-06 DIAGNOSIS — Z86711 Personal history of pulmonary embolism: Secondary | ICD-10-CM | POA: Diagnosis not present

## 2021-03-06 DIAGNOSIS — Z79899 Other long term (current) drug therapy: Secondary | ICD-10-CM | POA: Insufficient documentation

## 2021-03-06 DIAGNOSIS — J449 Chronic obstructive pulmonary disease, unspecified: Secondary | ICD-10-CM | POA: Diagnosis not present

## 2021-03-06 DIAGNOSIS — Z7901 Long term (current) use of anticoagulants: Secondary | ICD-10-CM | POA: Diagnosis not present

## 2021-03-06 DIAGNOSIS — I5022 Chronic systolic (congestive) heart failure: Secondary | ICD-10-CM

## 2021-03-06 DIAGNOSIS — D6869 Other thrombophilia: Secondary | ICD-10-CM | POA: Diagnosis not present

## 2021-03-06 DIAGNOSIS — I130 Hypertensive heart and chronic kidney disease with heart failure and stage 1 through stage 4 chronic kidney disease, or unspecified chronic kidney disease: Secondary | ICD-10-CM | POA: Insufficient documentation

## 2021-03-06 DIAGNOSIS — N183 Chronic kidney disease, stage 3 unspecified: Secondary | ICD-10-CM | POA: Diagnosis not present

## 2021-03-06 DIAGNOSIS — Z9989 Dependence on other enabling machines and devices: Secondary | ICD-10-CM

## 2021-03-06 LAB — COMPREHENSIVE METABOLIC PANEL
ALT: 23 U/L (ref 0–44)
AST: 24 U/L (ref 15–41)
Albumin: 3.3 g/dL — ABNORMAL LOW (ref 3.5–5.0)
Alkaline Phosphatase: 66 U/L (ref 38–126)
Anion gap: 8 (ref 5–15)
BUN: 31 mg/dL — ABNORMAL HIGH (ref 6–20)
CO2: 30 mmol/L (ref 22–32)
Calcium: 8.8 mg/dL — ABNORMAL LOW (ref 8.9–10.3)
Chloride: 104 mmol/L (ref 98–111)
Creatinine, Ser: 2.82 mg/dL — ABNORMAL HIGH (ref 0.61–1.24)
GFR, Estimated: 25 mL/min — ABNORMAL LOW (ref >60)
Glucose, Bld: 116 mg/dL — ABNORMAL HIGH (ref 70–99)
Potassium: 3.8 mmol/L (ref 3.5–5.1)
Sodium: 142 mmol/L (ref 135–145)
Total Bilirubin: 0.8 mg/dL (ref 0.3–1.2)
Total Protein: 5.8 g/dL — ABNORMAL LOW (ref 6.5–8.1)

## 2021-03-06 LAB — TSH: TSH: 1.129 u[IU]/mL (ref 0.350–4.500)

## 2021-03-06 MED ORDER — TORSEMIDE 20 MG PO TABS: 40.0000 mg | ORAL_TABLET | Freq: Two times a day (BID) | ORAL | 0 refills | Status: DC

## 2021-03-06 NOTE — Patient Instructions (Signed)
CONTINUE Torsemide 40 mg twice a day  Your physician recommends that you schedule a follow-up appointment in: 12 months with Dr Haroldine Laws Please call in October 2023 for a follow up  Do the following things EVERYDAY: Weigh yourself in the morning before breakfast. Write it down and keep it in a log. Take your medicines as prescribed Eat low salt foods--Limit salt (sodium) to 2000 mg per day.  Stay as active as you can everyday Limit all fluids for the day to less than 2 liters  At the [facility], you and your health needs are our priority. As part of our continuing mission to provide you with exceptional heart care, we have created designated Provider Care Teams. These Care Teams include your primary Cardiologist (physician) and Advanced Practice Providers (APPs- Physician Assistants and Nurse Practitioners) who all work together to provide you with the care you need, when you need it.   You may see any of the following providers on your designated Care Team at your next follow up: Dr Glori Bickers Dr Haynes Kerns, NP Lyda Jester, [place] [facility] [place], [place] Audry Riles, PharmD   Please be sure to bring in all your medications bottles to every appointment.    If you have any questions or concerns before your next appointment please send [place] a message through [employer] or call our office at [phone].    TO LEAVE A MESSAGE FOR THE NURSE SELECT OPTION 2, PLEASE LEAVE A MESSAGE INCLUDING: YOUR NAME DATE OF BIRTH CALL BACK NUMBER REASON FOR CALL**this is important as we prioritize the call backs  YOU WILL RECEIVE A CALL BACK THE SAME DAY AS LONG AS YOU CALL BEFORE 4:00 PM
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
## 2021-03-06 NOTE — Addendum Note (Signed)
Encounter addended by: Kerry Dory, CMA on: 03/06/2021 9:43 AM  Actions taken: Pharmacy for encounter modified, Order list changed, Clinical Note Signed

## 2021-03-06 NOTE — Progress Notes (Signed)
Advanced Heart Failure Clinic Note   Referring Physician: [name] Primary Care: Silvio Pate Primary Cardiologist: [name]  EP: Dr Rayann Heman HF MD: Dr Haroldine Laws   HPI: Phillip [name] is a 60 y.o. male hx of PAF, remote PE, hx of DVT, OSA, morbid obesity, CKD Stage III,  HTN, and  combined chronic systolic/diastolic. S/P A Flutter ablation May 2019 .     EF in 2015 40-45% Echo 12/21/16 with EF 55-60% G2DD, mild aortic regurgitation.  Last nuc 2008 no ischemia, no hx of cath due to CKD.    Developed AFL and underwent AFL ablation in 5/19.  I saw him last on 10/11/18 and was feeling bad. More SOB. Having some pressure in his chest and arm. + LE edema. Found to be in AFL/AF in 80s. Echo at that time with EF back down to 30%. Underwent DC-CV on 10/14/18 and referred to AF Clinic.   Developed recurrent AF at the end of January 2021. Underwent attempt at [facility] on 04/22/19 but failed to convert. Seen back in AF Clinic on 2/5. Not able to use Tikosyn due to long QT. Not ablation candidate due to obesity. Considered amiodarone. Started on flecainide but  had treadmill to monitor Flecainide Tx.   Patient had significant QRS widening within first minute of exercise.  Flecainide stopped  Recurrent AF in 9/22. Admitted. Had DC-CV. Amiodarone started.   Echo 11/20 EF 45-50%  Echo 5/21 EF 45-50%  Echo 9/22 EF 45-50% Marked LAE   Returns for f/u. Says he feels great. Remains on amio. No further AF. Denies SOB, orthopnea or PND. Walks on TM 10-51mins per day. Weight unchanged. No bleeding on Xarelto.     Review of systems complete and found to be negative unless listed in HPI.    Past Medical History:  Diagnosis Date   Asthma    CHF (congestive heart failure) ([place]) 11/15   EF 40-45%, improved with follow-up   Chronic kidney disease    COPD (chronic obstructive pulmonary disease) (HCC)    DVT (deep venous thrombosis) (HCC)    Gout    Hypertension    Hypertensive cardiovascular disease    Kidney dysfunction     Morbid obesity (HCC)    OSA on CPAP    Paroxysmal atrial fibrillation (HCC)    PNA (pneumonia)    Pulmonary embolism ([place]) 2010   left leg DVT   Sleep apnea    uses cpap   Current Outpatient Medications  Medication Sig Dispense Refill   albuterol (PROAIR HFA) 108 (90 Base) MCG/ACT inhaler Inhale 2 puffs into the lungs every 4 (four) hours as needed for wheezing or shortness of breath. 1 Inhaler 3   albuterol (PROVENTIL) (2.5 MG/3ML) 0.083% nebulizer solution INHALE 1 VIAL VIA NEBULIZER EVERY 6 HOURS AS NEEDED FOR WHEEZING/SHORTNESS OF BREATH. 375 mL 11   allopurinol (ZYLOPRIM) 300 MG tablet TAKE 1 TABLET BY MOUTH EVERY DAY 90 tablet 0   amiodarone (PACERONE) 200 MG tablet Take 1 tablet (200 mg total) by mouth daily. 90 tablet 3   budesonide-formoterol (SYMBICORT) 80-4.5 MCG/ACT inhaler Inhale 2 puffs into the lungs 2 (two) times daily. 3 each 3   carvedilol (COREG) 25 MG tablet Take 1 tablet (25 mg total) by mouth 2 (two) times daily with a meal. 180 tablet 3   Cholecalciferol (VITAMIN D3) 50 MCG (2000 UT) TABS Take 2,000 Units by mouth daily.     colchicine 0.6 MG tablet Take 0.6 mg by mouth daily as needed (gout flares).  hydrALAZINE (APRESOLINE) 100 MG tablet TAKE 1 TABLET THREE TIMES A DAY (CHANGE IN DOSAGE) 90 tablet 0   isosorbide mononitrate (IMDUR) 120 MG 24 hr tablet Take 1 tablet (120 mg total) by mouth daily. 90 tablet 3   KLOR-CON M20 20 MEQ tablet TAKE 2 TABLETS (40 MEQ) EVERY MORNING AND 1 TABLET (20 MEQ) EVERY EVENING 270 tablet 3   oxymetazoline (AFRIN NASAL SPRAY) 0.05 % nasal spray Place 1 spray into both nostrils 2 (two) times daily as needed for congestion.     Probiotic Product (PROBIOTIC DAILY PO) Take 1 capsule by mouth daily.     torsemide (DEMADEX) 20 MG tablet Take 2 tablets (40 mg total) by mouth 2 (two) times daily. 540 tablet 3   UNABLE TO FIND Med Name: CPAP with sleep     XARELTO 20 MG TABS tablet TAKE 1 TABLET DAILY WITH SUPPER (NEEDS APPOINTMENT FOR  FURTHER REFILLS) 90 tablet 3   No current facility-administered medications for this encounter.   Allergies  Allergen Reactions   Other Anaphylaxis    mushrooms    Social History   Socioeconomic History   Marital status: Divorced    Spouse name: Not on file   Number of children: 2   Years of education: 12   Highest education level: Not on file  Occupational History   Occupation: Drives dump truck    Comment:     Occupation: Airline pilot    Comment: Disabled  Tobacco Use   Smoking status: Former    Packs/day: 0.25    Years: 1.00    Pack years: 0.25    Types: Cigarettes    Quit date: 07/23/1989    Years since quitting: 31.6   Smokeless tobacco: Never   Tobacco comments:    Former smoker 03/06/2021  Vaping Use   Vaping Use: Never used  Substance and Sexual Activity   Alcohol use: No    Alcohol/week: 0.0 standard drinks   Drug use: No   Sexual activity: Not Currently  Other Topics Concern   Not on file  Social History Narrative   Pt lives in [place], alone.   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   Has 2 sons in [place]      No living will   Would want sons to make decisions for him   Would accept resuscitation   Social Determinants of Health   Financial Resource Strain: Not on file  Food Insecurity: Not on file  Transportation Needs: Not on file  Physical Activity: Not on file  Stress: Not on file  Social Connections: Not on file  Intimate Partner Violence: Not on file    Family History  Problem Relation Age of Onset   Hypertension Mother    Diabetes Father    CAD Father    Clotting disorder Father    Heart disease Father        transplant   Stomach cancer Father    Allergies Brother    Colon cancer Neg Hx    Colon polyps Neg Hx    Esophageal cancer Neg Hx    Rectal cancer Neg Hx    Vitals:   03/06/21 0917  BP: 130/60  Pulse: 63  SpO2: 98%  Weight: (!) 145.2 kg (320 lb)   Wt Readings from Last 3  Encounters:  03/06/21 (!) 145.2 kg (320 lb)  03/06/21 (!) 145.3 kg (320 lb 6.4 oz)  02/26/21 (!) 147.4 kg (325 lb)  PHYSICAL EXAM: General:  Well appearing. No resp difficulty HEENT: normal Neck: supple. no JVD. Carotids 2+ bilat; no bruits. No lymphadenopathy or thryomegaly appreciated. Cor: PMI nondisplaced. Regular rate & rhythm. No rubs, gallops or murmurs. Lungs: clear Abdomen: obese soft, nontender, nondistended. No hepatosplenomegaly. No bruits or masses. Good bowel sounds. Extremities: no cyanosis, clubbing, rash, edema Neuro: alert & orientedx3, cranial nerves grossly intact. moves all 4 extremities w/o difficulty. Affect pleasant   ECG NSR 63 + LVH Personally reviewed   ASSESSMENT & PLAN:  1. Chronic systolic/diastolic HF - Echo 09/1060 LVEF 55-60%, Grade 2 DD. - EF 7/20 back down to 30%. Suspect due to AF but rate not overly elevated. HTN may also be playing a role. - Symptoms improved after DC-CV on 10/11/18  - Echo 11/20 EF 45-50% - Echo 5/21 EF 45-50% - Echo 9/22 EF 45-50% - Doing well now that he is back in NSR - Stable NYHA II - Volume status mildly elevated - Continue carvedilol 25 bid - Continue torsemide 40 bid  - Continue hydralazine 100 tid and Imdur - Off Farxiga due to AKI and wants to stay off due to cost as well - Has not had cath due to CKD and lack of clear anginal symptoms.  - No ACE/ARB with CKD (creatinine has run 2.5-3.3)  2. PAF / Typical A-flutter, paroxysmal - S/P AFL ablation 07/2017. - s/p DC-CV of AFib on 10/14/18 and 05/10/19 and 9/22 - Amio started 9/22 - Remains in NSR - Continue Xarelto 20 mg daily. No bleeding - He is compliant with CPAP. Needs further weight loss - Has been seen in AF Clinic and felt not to be candidate for RFA due to size and LAE.  - given young age woul like to avoid long-term exposure to [facility]. Will d/w AF clinic whether or not it is worth referral to Dr. Lenard Galloway to consider repeat high-risk ablation  3.  Hypertension - Blood pressure well controlled. Continue current regimen.  4. Morbid obesity - Body mass index is 47.26 kg/m.  - continue weight loss efforts - Will discuss possible GLP-1RA for weight loss with Dr. Silvio Pate  5. CKD IIIb - Creatinine baseline ~2.5 -3.0  - Follows with Dr. Candiss Norse. Saw him in 3/22 and creatinine was stable  6. OSA on CPAP - compliant with CPAP  - continue with weight loss   Glori Bickers, MD  9:27 AM

## 2021-03-12 ENCOUNTER — Encounter: Payer: Managed Care, Other (non HMO) | Admitting: Internal Medicine

## 2021-03-13 ENCOUNTER — Other Ambulatory Visit (HOSPITAL_COMMUNITY): Payer: Self-pay | Admitting: *Deleted

## 2021-03-13 MED ORDER — TORSEMIDE 20 MG PO TABS
40.0000 mg | ORAL_TABLET | Freq: Two times a day (BID) | ORAL | 11 refills | Status: DC
Start: 2021-03-13 — End: 2021-03-22

## 2021-03-22 ENCOUNTER — Other Ambulatory Visit (HOSPITAL_COMMUNITY): Payer: Self-pay

## 2021-03-22 MED ORDER — TORSEMIDE 20 MG PO TABS
40.0000 mg | ORAL_TABLET | Freq: Two times a day (BID) | ORAL | 3 refills | Status: DC
Start: 2021-03-22 — End: 2022-03-31

## 2021-03-28 ENCOUNTER — Other Ambulatory Visit: Payer: Self-pay

## 2021-03-28 ENCOUNTER — Ambulatory Visit: Payer: Managed Care, Other (non HMO) | Admitting: Internal Medicine

## 2021-03-28 ENCOUNTER — Encounter: Payer: Self-pay | Admitting: Internal Medicine

## 2021-03-28 DIAGNOSIS — J4530 Mild persistent asthma, uncomplicated: Secondary | ICD-10-CM | POA: Diagnosis not present

## 2021-03-28 DIAGNOSIS — Z9989 Dependence on other enabling machines and devices: Secondary | ICD-10-CM

## 2021-03-28 DIAGNOSIS — G4733 Obstructive sleep apnea (adult) (pediatric): Secondary | ICD-10-CM | POA: Diagnosis not present

## 2021-03-28 NOTE — Patient Instructions (Signed)
No change in medications:  Plan A = Automatic = Always=    Symbicort 80 Take 2 puffs first thing in am and then another 2 puffs about 12 hours later.     Plan B = Backup (to supplement plan A, not to replace it) Only use your albuterol inhaler as a rescue medication to be used if you can't catch your breath by resting or doing a relaxed purse lip breathing pattern.  - The less you use it, the better it will work when you need it. - Ok to use the inhaler up to 2 puffs  every 4 hours if you must but call for appointment if use goes up over your usual need - Don't leave home without it !!  (think of it like the spare tire for your car)   Plan C = Crisis (instead of Plan B but only if Plan B stops working) - only use your albuterol nebulizer if you first try Plan B and it fails to help > ok to use the nebulizer up to every 4 hours but if start needing it regularly call for immediate appointment   Plan D = Doctor - call me if B and C not adequate

## 2021-03-28 NOTE — Progress Notes (Signed)
Subjective:     Patient ID: Phillip [name], male   DOB: Aug 05, 1960,     MRN: [id number]   Brief patient profile:  60  yobm no significant  smoking hx / played LB at semi pro level  at wt 240 and maintained that wt of < 260 up until retired from [employer] in 1999 with smoke exp in [place] at "79% lung capacity" per pulmonologist on prn saba with progressive wt gain since then assoc with progessive doe so self referred to pulmonary clinic 07/24/2015        History of Present Illness  09/30/2016  Acute ov/Mahdi Frye re: chronic asthma  symbicort 80  2bid / 02 2lpm hs  Chief Complaint  Patient presents with   Acute Visit    Pt states every morning he hears a rattling in his chest, still having the sob while walking, still has an occ. cough but mainly non productive, only has chest tightness when he exerts himself, he states he does has some chest conegstion Denies fever, pt unsure if his recent trip to [place] has anything to do with his symtpoms   worse x 3 weeks but better since in ER with dx with chf 09/15/16 and sleeping fine on cpap  Much better p lasix Some better after saba but mostly using neb and skipping hfa saba in action paln  Minimal rattling better p extra dose of diuretic rec Plan A = Automatic = Symbicort 80 Take 2 puffs first thing in am and then another 2 puffs about 12 hours later.  Plan B = Backup Only use your albuterol as a rescue medication to be used if you can't catch your breath Plan C = Crisis - only use your albuterol nebulizer if you first try Plan B           12/16/2016  f/u ov/Masie Bermingham re: symbicort 80 plus prn sabas/ no 02 at all  Chief Complaint  Patient presents with   Follow-up    Increased wheezing in the am x 2 months. He has been using his albuterol inhaler 2 x per day on average. He rarely uses his neb. He had some prod cough with yellow sputum 1 wk ago.    Off singulair since 03/2016 overall worse since but sleeps fine on cpap s 02  - s cpap wakes up gagging and  "wheezing" rec Restart singulair 10 mg each pm   Please see patient coordinator before you leave today  to schedule Internal medicine/ [place]       05/14/2018  f/u ov/Rosalynn Sergent re: mild chronic asthma  maint symb 80 2bid ? Adherence ?  Did not bring meds as req  Chief Complaint  Patient presents with   Follow-up    Cough has improved some- occ prod with clear sputum. He does not currently have a rescue inhaler and had not needed neb.   Dyspnea:  MMRC3 = can't walk 100 yards even at a slow pace at a flat grade s stopping due to sob   Cough: min mucoid/ sporadic though some worse in am Sleeping: on side on cpap  rec No change in meds Work perfecting inhaler technique:   10/02/18  To er with bnp up    10/07/2018  f/u ov/Mckenzee Beem re: worse sob /cough/ congested  x one month  p d/c ppi 6 weeks prior to [place] Complaint  Patient presents with   Acute Visit    increased SOB and chest  tightness x several wks. He is using his albuterol inhaler and neb about 2 x per wk.  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 He states he was txed with augmentin and then levaquin. Still has occ  cough with very light yellow sputum.   Dyspnea:  treadmill x 20 min @ 60mph level  Cough: better now  Sleeping: sleep on back / side flat bed one pillow and cpap compliant per download SABA YBO:FBPZ saba 02: none  Covid status:   vax x 3 / infected omicron    No obvious day to day or daytime variability or assoc excess/ purulent sputum or mucus plugs or hemoptysis or cp or chest tightness, subjective wheeze or overt sinus or hb symptoms.   Sleeping  without nocturnal  or early am exacerbation  of respiratory  c/o's or need for noct saba. Also denies any obvious fluctuation of symptoms with weather or environmental changes or other aggravating or alleviating factors except as outlined above   No unusual exposure hx or h/o childhood pna/ asthma or knowledge of premature birth.  Current Allergies, Complete Past Medical History, Past Surgical History, Family History, and Social History were reviewed in [employer] record.  ROS  The following are not active complaints unless bolded Hoarseness, sore throat, dysphagia, dental problems, itching, sneezing,  nasal congestion or discharge of excess mucus or purulent secretions, ear ache,   fever, chills, sweats, unintended wt loss or wt gain, classically pleuritic or exertional cp,  orthopnea pnd or arm/hand swelling  or leg swelling, presyncope, palpitations, abdominal pain, anorexia, nausea, vomiting, diarrhea  or change in bowel habits or change in bladder habits, change in stools or change in urine, dysuria, hematuria,  rash, arthralgias, visual complaints, headache, numbness, weakness or ataxia or problems with walking or coordination,  change in mood or  memory.        Current Meds  Medication Sig   albuterol (PROAIR HFA) 108 (90 Base) MCG/ACT inhaler Inhale 2 puffs into the lungs every 4 (four) hours as needed for wheezing or shortness of breath.   albuterol (PROVENTIL) (2.5 MG/3ML) 0.083% nebulizer solution INHALE 1 VIAL  VIA NEBULIZER EVERY 6 HOURS AS NEEDED FOR WHEEZING/SHORTNESS OF BREATH.   allopurinol (ZYLOPRIM) 300 MG tablet TAKE 1 TABLET BY MOUTH EVERY DAY   amiodarone (PACERONE) 200 MG tablet Take 1 tablet (200 mg total) by mouth daily.   budesonide-formoterol (SYMBICORT) 80-4.5 MCG/ACT inhaler Inhale 2 puffs into the lungs  2 (two) times daily.   carvedilol (COREG) 25 MG tablet Take 1 tablet (25 mg total) by mouth 2 (two) times daily with a meal.   Cholecalciferol (VITAMIN D3) 50 MCG (2000 UT) TABS Take 2,000 Units by mouth daily.   colchicine 0.6 MG tablet Take 0.6 mg by mouth daily as needed (gout flares).    isosorbide mononitrate (IMDUR) 120 MG 24 hr tablet Take 1 tablet (120 mg total) by mouth daily.   KLOR-CON M20 20 MEQ tablet TAKE 2 TABLETS (40 MEQ) EVERY MORNING AND 1 TABLET (20 MEQ) EVERY EVENING   oxymetazoline (AFRIN NASAL SPRAY) 0.05 % nasal spray Place 1 spray into both nostrils 2 (two) times daily as needed for congestion.   Probiotic Product (PROBIOTIC DAILY PO) Take 1 capsule by mouth daily.   torsemide (DEMADEX) 20 MG tablet Take 2 tablets (40 mg total) by mouth 2 (two) times daily.   UNABLE TO FIND Med Name: CPAP with sleep   XARELTO 20 MG TABS tablet TAKE 1 TABLET DAILY WITH SUPPER (NEEDS APPOINTMENT FOR FURTHER REFILLS)          Objective:   Physical Exam    03/28/2021            319   10/07/2018          374   05/14/2018         382  12/17/2017          379  06/16/2017          374  03/09/2017      376  12/16/2016        372  09/30/2016        358  07/21/2016        362  04/22/2016        374  01/21/2016      390  12/27/2015        387   09/10/2015       372   07/24/15 389 lb 6.4 oz (176.631 kg)  07/08/15 384 lb 3.2 oz (174.272 kg)  06/21/15 390 lb 8 oz (177.13 kg)      Vital signs reviewed  03/28/2021  - Note at rest 02 sats  98% on RA   General appearance:    slt hoarse amb wm nad    HEENT : pt wearing mask not removed for exam due to covid -19 concerns.    NECK :   without JVD/Nodes/TM/ nl carotid upstrokes bilaterally   LUNGS: no acc muscle use,  Nl contour chest which is clear to A and P bilaterally without cough on insp or exp maneuvers   CV:  RRR  no s3 or murmur or increase in P2, and L > R pitting edema both LEs  ABD:  soft and nontender with nl inspiratory excursion in the supine position. No bruits or organomegaly appreciated, bowel sounds nl  MS:  Nl gait/ ext warm without deformities, calf tenderness, cyanosis or clubbing No obvious joint restrictions   SKIN: warm and dry without lesions    NEURO:  alert, approp, nl sensorium with  no motor or cerebellar deficits apparent.       I personally reviewed images and agree with radiology impression as follows:  CXR:   pa and lat 02/23/21 No evidence of acute cardiopulmonary disease  Assessment:

## 2021-03-29 ENCOUNTER — Encounter: Payer: Self-pay | Admitting: Internal Medicine

## 2021-03-29 NOTE — Assessment & Plan Note (Addendum)
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 Assoc with chf/ cap - PFT's  01/21/2016  FEV1 2.11 (71 % ) ratio 84  p 12 % improvement from saba p breo 100 and on last day of pred taper  prior to study with DLCO  73/82  % corrects to 120 % for alv volume  - 01/21/2016    change to symbicort  160 2bid  - FENO 04/22/2016  =   16 on symb 160 with sub optimal technique and singuair > d/c'd singulair  - 04/22/2016    try symbicort 80 2bid - Allergy profile  09/30/16  >  Eos 0.0 /  IgE 7 RAST neg    - restart singulair 10 mg each am 12/16/2016    - 05/14/2018  After extensive coaching inhaler device,  effectiveness =    75% (still too short Ti) - ? Flare off ppi  10/07/2018 so rec resume ppi bid   - The proper method of use, as well as anticipated side effects, of a metered-dose inhaler were discussed and demonstrated to the patient using teach back method.    All goals of chronic asthma control met including optimal function and elimination of symptoms with minimal need for rescue therapy.  Contingencies discussed in full including contacting this office immediately if not controlling the symptoms using the rule of two's.     F/u yearly - call sooner if needed

## 2021-03-29 NOTE — Assessment & Plan Note (Signed)
PFTS with ERV  01/21/2016  = 24% c/w body habitus   Body mass index is 47.11 kg/m.  -  trending down  Lab Results  Component Value Date   TSH 1.129 03/06/2021      Contributing to doe and risk of GERD >>>   reviewed the need and the process to achieve and maintain neg calorie balance > defer f/u primary care including intermittently monitoring thyroid status             Each maintenance medication was reviewed in detail including emphasizing most importantly the difference between maintenance and prns and under what circumstances the prns are to be triggered using an action plan format where appropriate.  Total time for H and P, chart review, counseling, reviewing hfa device(s) and generating customized AVS unique to this office visit / same day charting = 22 min

## 2021-03-29 NOTE — Assessment & Plan Note (Signed)
ono RA/cpap   04/09/17 :  Sat < 89 x 4 min so no change Rx (continue cpap)  - download from 05/02/18  97% usage  @ > 4 h/ night and  avg 8h 48 min and  AHI 1.7 with cpap 18 cm Download 03/28/2021 30/30 days x 9 h/ day ave at 18 cm with AHI 1.6   Compliant/ f/u with sleep medicine as directed previously

## 2021-04-06 ENCOUNTER — Other Ambulatory Visit: Payer: Self-pay | Admitting: Internal Medicine

## 2021-04-08 ENCOUNTER — Encounter (HOSPITAL_COMMUNITY): Payer: Self-pay | Admitting: Emergency Medicine

## 2021-04-08 ENCOUNTER — Emergency Department (HOSPITAL_COMMUNITY)
Admission: EM | Admit: 2021-04-08 | Discharge: 2021-04-08 | Disposition: A | Discharge status: Home / Self Care | Payer: Managed Care, Other (non HMO) | Attending: Emergency Medicine | Admitting: Emergency Medicine

## 2021-04-08 ENCOUNTER — Emergency Department (HOSPITAL_COMMUNITY): Payer: Managed Care, Other (non HMO)

## 2021-04-08 DIAGNOSIS — Z7951 Long term (current) use of inhaled steroids: Secondary | ICD-10-CM | POA: Insufficient documentation

## 2021-04-08 DIAGNOSIS — I509 Heart failure, unspecified: Secondary | ICD-10-CM | POA: Insufficient documentation

## 2021-04-08 DIAGNOSIS — R600 Localized edema: Secondary | ICD-10-CM | POA: Diagnosis not present

## 2021-04-08 DIAGNOSIS — Z7901 Long term (current) use of anticoagulants: Secondary | ICD-10-CM | POA: Diagnosis not present

## 2021-04-08 DIAGNOSIS — R0602 Shortness of breath: Secondary | ICD-10-CM | POA: Diagnosis not present

## 2021-04-08 DIAGNOSIS — R0789 Other chest pain: Secondary | ICD-10-CM | POA: Insufficient documentation

## 2021-04-08 DIAGNOSIS — J449 Chronic obstructive pulmonary disease, unspecified: Secondary | ICD-10-CM | POA: Insufficient documentation

## 2021-04-08 DIAGNOSIS — E8779 Other fluid overload: Secondary | ICD-10-CM

## 2021-04-08 LAB — BASIC METABOLIC PANEL
Anion gap: 10 (ref 5–15)
BUN: 35 mg/dL — ABNORMAL HIGH (ref 6–20)
CO2: 28 mmol/L (ref 22–32)
Calcium: 8.9 mg/dL (ref 8.9–10.3)
Chloride: 105 mmol/L (ref 98–111)
Creatinine, Ser: 2.76 mg/dL — ABNORMAL HIGH (ref 0.61–1.24)
GFR, Estimated: 26 mL/min — ABNORMAL LOW (ref >60)
Glucose, Bld: 76 mg/dL (ref 70–99)
Potassium: 3.9 mmol/L (ref 3.5–5.1)
Sodium: 143 mmol/L (ref 135–145)

## 2021-04-08 LAB — CBC
HCT: 34.5 % — ABNORMAL LOW (ref 39.0–52.0)
Hemoglobin: 10.7 g/dL — ABNORMAL LOW (ref 13.0–17.0)
MCH: 28.8 pg (ref 26.0–34.0)
MCHC: 31 g/dL (ref 30.0–36.0)
MCV: 93 fL (ref 80.0–100.0)
Platelets: 109 10*3/uL — ABNORMAL LOW (ref 150–400)
RBC: 3.71 MIL/uL — ABNORMAL LOW (ref 4.22–5.81)
RDW: 15.9 % — ABNORMAL HIGH (ref 11.5–15.5)
WBC: 3.2 10*3/uL — ABNORMAL LOW (ref 4.0–10.5)
nRBC: 0 % (ref 0.0–0.2)

## 2021-04-08 LAB — BRAIN NATRIURETIC PEPTIDE: B Natriuretic Peptide: 1026.9 pg/mL — ABNORMAL HIGH (ref 0.0–100.0)

## 2021-04-08 LAB — TROPONIN I (HIGH SENSITIVITY)
Troponin I (High Sensitivity): 37 ng/L — ABNORMAL HIGH (ref <18)
Troponin I (High Sensitivity): 41 ng/L — ABNORMAL HIGH (ref <18)

## 2021-04-08 MED ORDER — FUROSEMIDE 10 MG/ML IJ SOLN
80.0000 mg | Freq: Once | INTRAMUSCULAR | Status: AC
Start: 2021-04-08 — End: 2021-04-08
  Administered 2021-04-08: 80 mg via INTRAVENOUS
  Filled 2021-04-08: qty 8

## 2021-04-08 NOTE — ED Notes (Signed)
Patient verbalizes understanding of discharge instructions. Opportunity for questioning and answers were provided. Armband removed by staff, pt discharged from ED via wheelchair.  

## 2021-04-08 NOTE — ED Notes (Signed)
Lab called again to run BNP

## 2021-04-08 NOTE — ED Provider Notes (Signed)
[facility] EM[facility] DEPART[facility] Provider Note   CSN: [id number] Arrival date & time: 04/08/21  1257     History  Chief Complaint  Patient presents with   Chest Pain    Phillip [name] is a 61 y.o. male.   Chest Pain Associated symptoms: shortness of breath    61 y.o. male with a h/o paroxysmal afib and atrial flutter, CHF, COPD,morbid obesity, OSA on CPAP,PE/DVT,s/p atrial flutter ablation May 2019 who presents to our ED for evaluation of SOB and chest pressure.  Pt reports 2 days of Sob with associated chest fullness and lower extremity swelling. The patient reports being compliant with his diet and medications in that time. Also reports taking an additional dose of his diuretics.   The patient reports that his weight has increased from 311 on Saturday to 320 today. Denies hemoptysis, abdominal pain, rash, headaches, weakness, fever and chills.The patient is followed by the Advanced heart failure team. Patient has been making urine.      Home Medications Prior to Admission medications   Medication Sig Start Date End Date Taking? Authorizing Provider  albuterol (PROAIR HFA) 108 (90 Base) MCG/ACT inhaler Inhale 2 puffs into the lungs every 4 (four) hours as needed for wheezing or shortness of breath. 05/14/18   Tanda Rockers, MD  albuterol (PROVENTIL) (2.5 MG/3ML) 0.083% nebulizer solution INHALE 1 VIAL VIA NEBULIZER EVERY 6 HOURS AS NEEDED FOR WHEEZING/SHORTNESS OF BREATH. 02/22/21   Tanda Rockers, MD  allopurinol (ZYLOPRIM) 300 MG tablet TAKE 1 TABLET BY MOUTH EVERY DAY 04/07/21   [name], MD  amiodarone (PACERONE) 200 MG tablet Take 1 tablet (200 mg total) by mouth daily. 12/02/20   Kroeger, Lorelee Cover., PA-C  budesonide-formoterol (SYMBICORT) 80-4.5 MCG/ACT inhaler Inhale 2 puffs into the lungs 2 (two) times daily. 12/13/20   Martyn Ehrich, NP  carvedilol (COREG) 25 MG tablet Take 1 tablet (25 mg total) by mouth 2 (two) times daily with a meal. 11/24/20    Kroeger, Lorelee Cover., PA-C  Cholecalciferol (VITAMIN D3) 50 MCG (2000 UT) TABS Take 2,000 Units by mouth daily.    [provider]  colchicine 0.6 MG tablet Take 0.6 mg by mouth daily as needed (gout flares).  07/25/16   [provider]  hydrALAZINE (APRESOLINE) 100 MG tablet TAKE 1 TABLET THREE TIMES A DAY (CHANGE IN DOSAGE) Patient not taking: Reported on 03/28/2021 06/04/20   Bensimhon, Shaune Pascal, MD  isosorbide mononitrate (IMDUR) 120 MG 24 hr tablet Take 1 tablet (120 mg total) by mouth daily. 11/25/20   Kroeger, Daleen Snook M., PA-C  KLOR-CON M20 20 MEQ tablet TAKE 2 TABLETS (40 MEQ) EVERY MORNING AND 1 TABLET (20 MEQ) EVERY EVENING 01/25/21   Bensimhon, Shaune Pascal, MD  oxymetazoline (AFRIN NASAL SPRAY) 0.05 % nasal spray Place 1 spray into both nostrils 2 (two) times daily as needed for congestion. 03/12/17   Oswald Hillock, MD  Probiotic Product (PROBIOTIC DAILY PO) Take 1 capsule by mouth daily.    [provider]  torsemide (DEMADEX) 20 MG tablet Take 2 tablets (40 mg total) by mouth 2 (two) times daily. 03/22/21   Bensimhon, Shaune Pascal, MD  UNABLE TO FIND Med Name: CPAP with sleep    [provider]  XARELTO 20 MG TABS tablet TAKE 1 TABLET DAILY WITH SUPPER (NEEDS APPOINTMENT FOR FURTHER REFILLS) 09/21/20   Bensimhon, Shaune Pascal, MD      Allergies    Other    Review of Systems  Review of Systems  Respiratory:  Positive for shortness of breath.   Cardiovascular:  Positive for chest pain and leg swelling.  All other systems reviewed and are negative.  Physical Exam Updated Vital Signs BP (!) 147/75 (BP Location: Left Arm)    Pulse 63    Temp 98.4 F (36.9 C) (Oral)    Resp 16    SpO2 92%  Physical Exam Vitals and nursing note reviewed.  Constitutional:      General: He is not in acute distress.    Appearance: He is well-developed. He is obese. He is not ill-appearing, toxic-appearing or diaphoretic.  HENT:     Head: Normocephalic and atraumatic.  Eyes:      Conjunctiva/sclera: Conjunctivae normal.  Cardiovascular:     Rate and Rhythm: Normal rate and regular rhythm.     Heart sounds: No murmur heard. Pulmonary:     Effort: Pulmonary effort is normal. No respiratory distress.     Breath sounds: Normal breath sounds. No wheezing.  Abdominal:     Palpations: Abdomen is soft.     Tenderness: There is no abdominal tenderness.  Musculoskeletal:        General: No swelling.     Cervical back: Neck supple.     Right lower leg: Edema present.     Left lower leg: Edema present.  Skin:    General: Skin is warm and dry.     Capillary Refill: Capillary refill takes less than 2 seconds.  Neurological:     General: No focal deficit present.     Mental Status: He is alert and oriented to person, place, and time. Mental status is at baseline.  Psychiatric:        Mood and Affect: Mood normal.    ED Results / Procedures / Treatments   Labs (all labs ordered are listed, but only abnormal results are displayed) Labs Reviewed  BASIC METABOLIC PANEL - Abnormal; Notable for the following components:      Result Value   BUN 35 (*)    Creatinine, Ser 2.76 (*)    GFR, Estimated 26 (*)    All other components within normal limits  CBC - Abnormal; Notable for the following components:   WBC 3.2 (*)    RBC 3.71 (*)    Hemoglobin 10.7 (*)    HCT 34.5 (*)    RDW 15.9 (*)    Platelets 109 (*)    All other components within normal limits  TROPONIN I (HIGH SENSITIVITY) - Abnormal; Notable for the following components:   Troponin I (High Sensitivity) 37 (*)    All other components within normal limits  TROPONIN I (HIGH SENSITIVITY) - Abnormal; Notable for the following components:   Troponin I (High Sensitivity) 41 (*)    All other components within normal limits    EKG None  Radiology DG Chest 2 View  Result Date: 04/08/2021 CLINICAL DATA:  Chest pain. EXAM: CHEST - 2 VIEW COMPARISON:  02/23/2021 FINDINGS: The lungs are clear without focal  pneumonia, edema, pneumothorax or pleural effusion. The cardio pericardial silhouette is enlarged. The visualized bony structures of the thorax show no acute abnormality. IMPRESSION: Cardiomegaly.  No acute cardiopulmonary findings. Electronically Signed   By: Misty Stanley M.D.   On: 04/08/2021 14:01    Procedures Procedures    Medications Ordered in ED Medications - No data to display  ED Course/ Medical Decision Making/ A&P  This is a 61 yo male presenting for SOB. Patient is HDS afebrile and not on supplemental oxygen.   Ddx considered but not limited to the following: Hear failure exacerbation, asthma exacerbation, PNA, PNX, ACS.   Will obtain labs and imaging to further evaluate.  Troponin elevated. But appears to be at patients baseline. CMP with elevated Cr but again at patients baseline. Patient leukopenic and anemic but again at baseline. BNP pending at the time of discharge. Expected to be elevated but would not change treatment nor disposition.  CXR reviewed independently: does not reveal acute cardiopulmonary process. Specifically not volume overloaded. EKG does reveal the patient to be in rate controlled a flutter. 80mg  IV lasix were administered which provided symptomatic relief.   Patient was discharge with instructions to follow up with both his PCP and his Cardiologist. Return precautions were provided.     Final Clinical Impression(s) / ED Diagnoses Final diagnoses:  None    Rx / DC Orders ED Discharge Orders     None         Zachery Dakins, MD 04/08/21 2332    Dorie Rank, MD 04/10/21 1705

## 2021-04-08 NOTE — ED Triage Notes (Signed)
Patient here with complaint of chest pain and exertional shortness of breath that started a few days ago. Patient states she has taken extra doses of his diuretic over the same time period with no improvement in symptoms. Patient alert, oriented, ambulatory, speaking in complete sentences, and in no apparent distress at this time.

## 2021-04-08 NOTE — Discharge Instructions (Signed)
Please follow up with your Heart Failure Specialist for evaluation of your SOB and Fluid overload.   Please continue taking your diuretic and following a low sodium diet.

## 2021-04-10 ENCOUNTER — Telehealth: Payer: Self-pay

## 2021-04-10 NOTE — Telephone Encounter (Signed)
Called to check on pt to see how he is doing after ER Visit. He said he is doing much better since getting the fluid out.

## 2021-04-12 ENCOUNTER — Telehealth (HOSPITAL_COMMUNITY): Payer: Self-pay | Admitting: *Deleted

## 2021-04-12 NOTE — Telephone Encounter (Signed)
Pt left vm sating he feels like he is back in afib and want a sooner appt. I called pt back no answer/left vm for return call.

## 2021-04-13 ENCOUNTER — Inpatient Hospital Stay (HOSPITAL_COMMUNITY)
Admission: EM | Admit: 2021-04-13 | Discharge: 2021-04-18 | DRG: 291 | Disposition: A | Discharge status: Home / Self Care | Payer: Managed Care, Other (non HMO) | Attending: Internal Medicine | Admitting: Internal Medicine

## 2021-04-13 ENCOUNTER — Other Ambulatory Visit: Payer: Self-pay

## 2021-04-13 ENCOUNTER — Emergency Department (HOSPITAL_COMMUNITY): Payer: Managed Care, Other (non HMO)

## 2021-04-13 DIAGNOSIS — Z7901 Long term (current) use of anticoagulants: Secondary | ICD-10-CM

## 2021-04-13 DIAGNOSIS — Z87891 Personal history of nicotine dependence: Secondary | ICD-10-CM

## 2021-04-13 DIAGNOSIS — Z86711 Personal history of pulmonary embolism: Secondary | ICD-10-CM

## 2021-04-13 DIAGNOSIS — I4892 Unspecified atrial flutter: Secondary | ICD-10-CM | POA: Diagnosis present

## 2021-04-13 DIAGNOSIS — I5023 Acute on chronic systolic (congestive) heart failure: Secondary | ICD-10-CM | POA: Diagnosis not present

## 2021-04-13 DIAGNOSIS — G4733 Obstructive sleep apnea (adult) (pediatric): Secondary | ICD-10-CM

## 2021-04-13 DIAGNOSIS — I130 Hypertensive heart and chronic kidney disease with heart failure and stage 1 through stage 4 chronic kidney disease, or unspecified chronic kidney disease: Principal | ICD-10-CM | POA: Diagnosis present

## 2021-04-13 DIAGNOSIS — I480 Paroxysmal atrial fibrillation: Secondary | ICD-10-CM | POA: Diagnosis present

## 2021-04-13 DIAGNOSIS — Z6841 Body Mass Index (BMI) 40.0 and over, adult: Secondary | ICD-10-CM

## 2021-04-13 DIAGNOSIS — Z7951 Long term (current) use of inhaled steroids: Secondary | ICD-10-CM

## 2021-04-13 DIAGNOSIS — E876 Hypokalemia: Secondary | ICD-10-CM | POA: Diagnosis not present

## 2021-04-13 DIAGNOSIS — N179 Acute kidney failure, unspecified: Secondary | ICD-10-CM | POA: Diagnosis not present

## 2021-04-13 DIAGNOSIS — Z713 Dietary counseling and surveillance: Secondary | ICD-10-CM

## 2021-04-13 DIAGNOSIS — I509 Heart failure, unspecified: Secondary | ICD-10-CM

## 2021-04-13 DIAGNOSIS — I10 Essential (primary) hypertension: Secondary | ICD-10-CM | POA: Diagnosis present

## 2021-04-13 DIAGNOSIS — R001 Bradycardia, unspecified: Secondary | ICD-10-CM | POA: Diagnosis not present

## 2021-04-13 DIAGNOSIS — Z20822 Contact with and (suspected) exposure to covid-19: Secondary | ICD-10-CM | POA: Diagnosis present

## 2021-04-13 DIAGNOSIS — D61818 Other pancytopenia: Secondary | ICD-10-CM | POA: Diagnosis present

## 2021-04-13 DIAGNOSIS — D631 Anemia in chronic kidney disease: Secondary | ICD-10-CM | POA: Diagnosis present

## 2021-04-13 DIAGNOSIS — Z79899 Other long term (current) drug therapy: Secondary | ICD-10-CM

## 2021-04-13 DIAGNOSIS — Z91018 Allergy to other foods: Secondary | ICD-10-CM

## 2021-04-13 DIAGNOSIS — I5041 Acute combined systolic (congestive) and diastolic (congestive) heart failure: Secondary | ICD-10-CM

## 2021-04-13 DIAGNOSIS — M109 Gout, unspecified: Secondary | ICD-10-CM | POA: Diagnosis present

## 2021-04-13 DIAGNOSIS — N184 Chronic kidney disease, stage 4 (severe): Secondary | ICD-10-CM | POA: Diagnosis present

## 2021-04-13 DIAGNOSIS — J449 Chronic obstructive pulmonary disease, unspecified: Secondary | ICD-10-CM | POA: Diagnosis present

## 2021-04-13 DIAGNOSIS — Z86718 Personal history of other venous thrombosis and embolism: Secondary | ICD-10-CM

## 2021-04-13 DIAGNOSIS — Z8249 Family history of ischemic heart disease and other diseases of the circulatory system: Secondary | ICD-10-CM

## 2021-04-13 DIAGNOSIS — I5043 Acute on chronic combined systolic (congestive) and diastolic (congestive) heart failure: Secondary | ICD-10-CM | POA: Diagnosis present

## 2021-04-13 LAB — CBC WITH DIFFERENTIAL/PLATELET
Abs Immature Granulocytes: 0 10*3/uL (ref 0.00–0.07)
Basophils Absolute: 0 10*3/uL (ref 0.0–0.1)
Basophils Relative: 1 %
Eosinophils Absolute: 0 10*3/uL (ref 0.0–0.5)
Eosinophils Relative: 1 %
HCT: 36.6 % — ABNORMAL LOW (ref 39.0–52.0)
Hemoglobin: 11.6 g/dL — ABNORMAL LOW (ref 13.0–17.0)
Immature Granulocytes: 0 %
Lymphocytes Relative: 29 %
Lymphs Abs: 1.1 10*3/uL (ref 0.7–4.0)
MCH: 29.1 pg (ref 26.0–34.0)
MCHC: 31.7 g/dL (ref 30.0–36.0)
MCV: 91.7 fL (ref 80.0–100.0)
Monocytes Absolute: 0.3 10*3/uL (ref 0.1–1.0)
Monocytes Relative: 8 %
Neutro Abs: 2.3 10*3/uL (ref 1.7–7.7)
Neutrophils Relative %: 61 %
Platelets: 138 10*3/uL — ABNORMAL LOW (ref 150–400)
RBC: 3.99 MIL/uL — ABNORMAL LOW (ref 4.22–5.81)
RDW: 15.8 % — ABNORMAL HIGH (ref 11.5–15.5)
WBC: 3.7 10*3/uL — ABNORMAL LOW (ref 4.0–10.5)
nRBC: 0 % (ref 0.0–0.2)

## 2021-04-13 LAB — BASIC METABOLIC PANEL
Anion gap: 10 (ref 5–15)
BUN: 39 mg/dL — ABNORMAL HIGH (ref 6–20)
CO2: 26 mmol/L (ref 22–32)
Calcium: 9 mg/dL (ref 8.9–10.3)
Chloride: 105 mmol/L (ref 98–111)
Creatinine, Ser: 2.98 mg/dL — ABNORMAL HIGH (ref 0.61–1.24)
GFR, Estimated: 24 mL/min — ABNORMAL LOW (ref >60)
Glucose, Bld: 124 mg/dL — ABNORMAL HIGH (ref 70–99)
Potassium: 4 mmol/L (ref 3.5–5.1)
Sodium: 141 mmol/L (ref 135–145)

## 2021-04-13 LAB — BRAIN NATRIURETIC PEPTIDE: B Natriuretic Peptide: 1550 pg/mL — ABNORMAL HIGH (ref 0.0–100.0)

## 2021-04-13 LAB — TROPONIN I (HIGH SENSITIVITY): Troponin I (High Sensitivity): 42 ng/L — ABNORMAL HIGH (ref <18)

## 2021-04-13 NOTE — ED Triage Notes (Signed)
Pt c/o shortness of breath for the past 2 days. Pt states he was seen here last week for fluid on his lungs and was told he was in a-flutter.

## 2021-04-13 NOTE — ED Provider Triage Note (Signed)
Emergency Medicine Provider Triage Evaluation Note  Karriem Muench , a 61 y.o. male  was evaluated in triage.  Pt complains of presents with shortness of breath, feeling as if he is in a flutter, states been  going on for about a week's time, states he goes back and forth between sinus rhythm and a flutter, he denies any worsening peripheral edema, states he is down his normal weight, he has been taking his Lasix and his other medication as prescribed, was seen here on the 16th diuresed and later discharged..  Review of Systems  Positive: Shortness of breath, heart palpitations Negative: Peripheral edema, fevers  Physical Exam  BP (!) 168/98 (BP Location: Right Arm)    Pulse 82    Resp (!) 21    Ht 5\' 9"  (1.753 m)    Wt (!) 144 kg    SpO2 97%    BMI 46.88 kg/m  Gen:   Awake, no distress   Resp:  Normal effort  MSK:   Moves extremities without difficulty  Other:    Medical Decision Making  Medically screening exam initiated at 10:50 PM.  Appropriate orders placed.  Buddy Lokey was informed that the remainder of the evaluation will be completed by another provider, this initial triage assessment does not replace that evaluation, and the importance of remaining in the ED until their evaluation is complete.  Presents with shortness of breath heart palpitations, lab work imaging have been ordered patient will need further work-up.   Marcello Fennel, PA-C 04/13/21 2252

## 2021-04-14 ENCOUNTER — Encounter (HOSPITAL_COMMUNITY): Payer: Self-pay | Admitting: Internal Medicine

## 2021-04-14 ENCOUNTER — Other Ambulatory Visit (HOSPITAL_COMMUNITY): Payer: Managed Care, Other (non HMO)

## 2021-04-14 DIAGNOSIS — I5023 Acute on chronic systolic (congestive) heart failure: Secondary | ICD-10-CM | POA: Diagnosis not present

## 2021-04-14 DIAGNOSIS — Z7901 Long term (current) use of anticoagulants: Secondary | ICD-10-CM | POA: Diagnosis not present

## 2021-04-14 DIAGNOSIS — I480 Paroxysmal atrial fibrillation: Secondary | ICD-10-CM | POA: Diagnosis not present

## 2021-04-14 DIAGNOSIS — N184 Chronic kidney disease, stage 4 (severe): Secondary | ICD-10-CM | POA: Diagnosis not present

## 2021-04-14 LAB — RESP PANEL BY RT-PCR (FLU A&B, COVID) ARPGX2
Influenza A by PCR: NEGATIVE
Influenza B by PCR: NEGATIVE
SARS Coronavirus 2 by RT PCR: NEGATIVE

## 2021-04-14 LAB — TROPONIN I (HIGH SENSITIVITY): Troponin I (High Sensitivity): 37 ng/L — ABNORMAL HIGH (ref <18)

## 2021-04-14 LAB — MAGNESIUM: Magnesium: 2.3 mg/dL (ref 1.7–2.4)

## 2021-04-14 MED ORDER — FUROSEMIDE 10 MG/ML IJ SOLN
80.0000 mg | Freq: Once | INTRAMUSCULAR | Status: AC
  Administered 2021-04-14: 80 mg via INTRAVENOUS
  Filled 2021-04-14: qty 8

## 2021-04-14 MED ORDER — ACETAMINOPHEN 325 MG PO TABS: 650.0000 mg | ORAL_TABLET | Freq: Four times a day (QID) | ORAL | Status: DC | PRN

## 2021-04-14 MED ORDER — CARVEDILOL 25 MG PO TABS
25.0000 mg | ORAL_TABLET | Freq: Two times a day (BID) | ORAL | Status: DC
  Administered 2021-04-14 – 2021-04-16 (×5): 25 mg via ORAL
  Filled 2021-04-14 (×2): qty 2
  Filled 2021-04-14: qty 1
  Filled 2021-04-14 (×2): qty 8

## 2021-04-14 MED ORDER — AMIODARONE HCL 200 MG PO TABS
200.0000 mg | ORAL_TABLET | Freq: Every day | ORAL | Status: DC
Start: 2021-04-14 — End: 2021-04-18
  Administered 2021-04-14 – 2021-04-18 (×5): 200 mg via ORAL
  Filled 2021-04-14 (×5): qty 1

## 2021-04-14 MED ORDER — ISOSORBIDE MONONITRATE ER 60 MG PO TB24
120.0000 mg | ORAL_TABLET | Freq: Every day | ORAL | Status: DC
  Administered 2021-04-14 – 2021-04-16 (×3): 120 mg via ORAL
  Filled 2021-04-14: qty 2
  Filled 2021-04-14 (×2): qty 4

## 2021-04-14 MED ORDER — ONDANSETRON HCL 4 MG/2ML IJ SOLN: 4.0000 mg | Freq: Four times a day (QID) | INTRAMUSCULAR | Status: DC | PRN

## 2021-04-14 MED ORDER — ACETAMINOPHEN 650 MG RE SUPP: 650.0000 mg | Freq: Four times a day (QID) | RECTAL | Status: DC | PRN

## 2021-04-14 MED ORDER — FUROSEMIDE 10 MG/ML IJ SOLN
40.0000 mg | Freq: Two times a day (BID) | INTRAMUSCULAR | Status: DC
  Administered 2021-04-14 – 2021-04-15 (×4): 40 mg via INTRAVENOUS
  Filled 2021-04-14 (×4): qty 4

## 2021-04-14 MED ORDER — ONDANSETRON HCL 4 MG PO TABS: 4.0000 mg | ORAL_TABLET | Freq: Four times a day (QID) | ORAL | Status: DC | PRN

## 2021-04-14 MED ORDER — RIVAROXABAN 20 MG PO TABS
20.0000 mg | ORAL_TABLET | Freq: Every day | ORAL | Status: DC
  Administered 2021-04-14 – 2021-04-17 (×4): 20 mg via ORAL
  Filled 2021-04-14 (×2): qty 1
  Filled 2021-04-14: qty 2
  Filled 2021-04-14: qty 1

## 2021-04-14 NOTE — Subjective & Objective (Signed)
CC: SOB HPI: 61 year old African-American male with a history of A. fib, history of DVT, history of a chronic anticoagulation, history of chronic systolic heart failure EF of 45% who is followed by Dr. Haroldine Laws with the advanced heart failure clinic presents to the ER today with worsening shortness of breath.  Patient seen in the ER last week.  He was given an IV dose of Lasix.  He was sent home.  He was told to increase his torsemide dose at home.  He states that he was noted to be in A. fib flutter in the ER.  He states that he feels much better when he is in normal sinus rhythm.  He is unable to contact cardiology this past week.  He came back to the ER due to feeling poorly.  Chest x-ray demonstrates cardiomegaly with minor vascular congestion.  BNP is elevated to 1550.  Baseline is approximately 1000.  EKG demonstrates A. fib.  Rate controlled.  Serum creatinine 2.98.  [facility] hospitalist contacted for admission.

## 2021-04-14 NOTE — Assessment & Plan Note (Signed)
Stable

## 2021-04-14 NOTE — Assessment & Plan Note (Signed)
Chronic. 

## 2021-04-14 NOTE — Assessment & Plan Note (Signed)
Stable.  -Continue CPAP

## 2021-04-14 NOTE — ED Notes (Signed)
Pt NAD in bed, a/ox4. Speaking in full and complete sentences. Pt c/o having a-flutter as a HX and felt his chest fluttering with SOB. Pt states now it feels better but if he ambulates his symptoms come back. Denies CP. LS clear bilaterally.

## 2021-04-14 NOTE — ED Provider Notes (Signed)
[facility] EMERGENCY DEPARTMENT Provider Note   CSN: [id number] Arrival date & time: 04/13/21  2237     History  Chief Complaint  Patient presents with   Shortness of Breath   Chest Pain    Phillip [name] is a 61 y.o. male.  HPI  Patient with medical history including A. fib a flutter CHF COPD obesity OSA presents with chief complaint of shortness of breath and heart palpitations.  Patient states heart palpitations going on since last Monday, he states they have remained constant, he states he goes between sinus rhythm and A. fib, he states that she  felt more short of breath,  especially on exertion, he endorses worsening orthopnea but denies worsening peripheral edema.  States that he is on torsemide he takes 2 times daily, he notes that he came to ED last week for similar presentation was given Lasix and then later discharged home.  He states he is feeling worse from prior.  He states he still making urine he is having no chest pain at this time no fevers, chills, nausea, vomiting, general body aches.  After reviewing patient's chart he was seen on 01/16 lab work revealed a creatinine which was at his baseline troponins which are at baseline as well as a BNP he was given 80 of Lasix and then was sent home.  Home Medications Prior to Admission medications   Medication Sig Start Date End Date Taking? Authorizing Provider  albuterol (PROAIR HFA) 108 (90 Base) MCG/ACT inhaler Inhale 2 puffs into the lungs every 4 (four) hours as needed for wheezing or shortness of breath. Patient taking differently: Inhale 2 puffs into the lungs every 4 (four) hours as needed for shortness of breath. 05/14/18  Yes Tanda Rockers, MD  albuterol (PROVENTIL) (2.5 MG/3ML) 0.083% nebulizer solution INHALE 1 VIAL VIA NEBULIZER EVERY 6 HOURS AS NEEDED FOR WHEEZING/SHORTNESS OF BREATH. Patient taking differently: Take 2.5 mg by nebulization every 6 (six) hours as needed for shortness of breath.  02/22/21  Yes Tanda Rockers, MD  allopurinol (ZYLOPRIM) 300 MG tablet TAKE 1 TABLET BY MOUTH EVERY DAY Patient taking differently: Take 300 mg by mouth daily. 04/07/21  Yes [name], MD  amiodarone (PACERONE) 200 MG tablet Take 1 tablet (200 mg total) by mouth daily. 12/02/20  Yes Kroeger, Daleen Snook M., PA-C  budesonide-formoterol (SYMBICORT) 80-4.5 MCG/ACT inhaler Inhale 2 puffs into the lungs 2 (two) times daily. 12/13/20  Yes Martyn Ehrich, NP  carvedilol (COREG) 25 MG tablet Take 1 tablet (25 mg total) by mouth 2 (two) times daily with a meal. 11/24/20  Yes Kroeger, Daleen Snook M., PA-C  Cholecalciferol (VITAMIN D3) 50 MCG (2000 UT) TABS Take 2,000 Units by mouth daily.   Yes [provider]  colchicine 0.6 MG tablet Take 0.6 mg by mouth daily as needed (gout flares).  07/25/16  Yes [provider]  isosorbide mononitrate (IMDUR) 120 MG 24 hr tablet Take 1 tablet (120 mg total) by mouth daily. 11/25/20  Yes Kroeger, Daleen Snook M., PA-C  KLOR-CON M20 20 MEQ tablet TAKE 2 TABLETS (40 MEQ) EVERY MORNING AND 1 TABLET (20 MEQ) EVERY EVENING Patient taking differently: 20-40 mEq See admin instructions. 40 meq in the morning 20 meq in the evening 01/25/21  Yes Bensimhon, Shaune Pascal, MD  naproxen sodium (ALEVE) 220 MG tablet Take 440 mg by mouth daily as needed (pain/headache).   Yes [provider]  oxymetazoline (AFRIN NASAL SPRAY) 0.05 % nasal spray Place 1 spray  into both nostrils 2 (two) times daily as needed for congestion. 03/12/17  Yes Oswald Hillock, MD  Probiotic Product (PROBIOTIC DAILY PO) Take 1 capsule by mouth daily.   Yes [provider]  psyllium (REGULOID) 0.52 g capsule Take 1.04 g by mouth daily.   Yes [provider]  torsemide (DEMADEX) 20 MG tablet Take 2 tablets (40 mg total) by mouth 2 (two) times daily. 03/22/21  Yes Bensimhon, Shaune Pascal, MD  UNABLE TO FIND Med Name: CPAP with sleep   Yes [provider]  XARELTO 20 MG TABS tablet  TAKE 1 TABLET DAILY WITH SUPPER (NEEDS APPOINTMENT FOR FURTHER REFILLS) Patient taking differently: 20 mg daily with supper. 09/21/20  Yes Bensimhon, Shaune Pascal, MD  hydrALAZINE (APRESOLINE) 100 MG tablet TAKE 1 TABLET THREE TIMES A DAY (CHANGE IN DOSAGE) Patient not taking: Reported on 03/28/2021 06/04/20   Bensimhon, Shaune Pascal, MD      Allergies    Other    Review of Systems   Review of Systems  Constitutional:  Negative for chills and fever.  Respiratory:  Positive for shortness of breath.   Cardiovascular:  Positive for palpitations. Negative for chest pain and leg swelling.  Gastrointestinal:  Negative for abdominal pain.  Neurological:  Negative for headaches.   Physical Exam Updated Vital Signs BP (!) 164/79    Pulse 67    Resp 20    Ht 5\' 9"  (1.753 m)    Wt (!) 144 kg    SpO2 98%    BMI 46.88 kg/m  Physical Exam Vitals and nursing note reviewed.  Constitutional:      General: He is not in acute distress.    Appearance: He is not ill-appearing.  HENT:     Head: Normocephalic and atraumatic.     Nose: No congestion.  Eyes:     Conjunctiva/sclera: Conjunctivae normal.  Cardiovascular:     Rate and Rhythm: Normal rate. Rhythm irregular.     Pulses: Normal pulses.     Heart sounds: No murmur heard.   No friction rub. No gallop.  Pulmonary:     Effort: No respiratory distress.     Breath sounds: No wheezing, rhonchi or rales.  Abdominal:     Palpations: Abdomen is soft.     Tenderness: There is no abdominal tenderness. There is no right CVA tenderness or left CVA tenderness.  Musculoskeletal:     Right lower leg: No edema.     Left lower leg: No edema.  Skin:    General: Skin is warm and dry.  Neurological:     Mental Status: He is alert.  Psychiatric:        Mood and Affect: Mood normal.    ED Results / Procedures / Treatments   Labs (all labs ordered are listed, but only abnormal results are displayed) Labs Reviewed  BASIC METABOLIC PANEL - Abnormal; Notable for  the following components:      Result Value   Glucose, Bld 124 (*)    BUN 39 (*)    Creatinine, Ser 2.98 (*)    GFR, Estimated 24 (*)    All other components within normal limits  BRAIN NATRIURETIC PEPTIDE - Abnormal; Notable for the following components:   B Natriuretic Peptide 1,550.0 (*)    All other components within normal limits  CBC WITH DIFFERENTIAL/PLATELET - Abnormal; Notable for the following components:   WBC 3.7 (*)    RBC 3.99 (*)    Hemoglobin 11.6 (*)  HCT 36.6 (*)    RDW 15.8 (*)    Platelets 138 (*)    All other components within normal limits  TROPONIN I (HIGH SENSITIVITY) - Abnormal; Notable for the following components:   Troponin I (High Sensitivity) 42 (*)    All other components within normal limits  TROPONIN I (HIGH SENSITIVITY) - Abnormal; Notable for the following components:   Troponin I (High Sensitivity) 37 (*)    All other components within normal limits  RESP PANEL BY RT-PCR (FLU A&B, COVID) ARPGX2  MAGNESIUM    EKG None  Radiology DG Chest 2 View  Result Date: 04/13/2021 CLINICAL DATA:  Shortness of breath. EXAM: CHEST - 2 VIEW COMPARISON:  Chest radiograph dated 04/08/2021. FINDINGS: Similar cardiomegaly with mild vascular congestion. No focal consolidation, pleural effusion or pneumothorax. No acute osseous pathology. IMPRESSION: Cardiomegaly with mild vascular congestion. No focal consolidation. Electronically Signed   By: Anner Crete M.D.   On: 04/13/2021 23:09    Procedures Procedures    Medications Ordered in ED Medications  furosemide (LASIX) injection 80 mg (80 mg Intravenous Given 04/14/21 0030)    ED Course/ Medical Decision Making/ A&P                           Medical Decision Making Amount and/or Complexity of Data Reviewed Labs: ordered. Radiology: ordered.  Risk Prescription drug management. Decision regarding hospitalization.   This patient presents to the ED for concern of shortness of breath, heart  palpitations, this involves an extensive number of treatment options, and is a complaint that carries with it a high risk of complications and morbidity.  The differential diagnosis includes ACS, volume overload,    Additional history obtained:  Additional history obtained from electronic medical record External records from outside source obtained and reviewed including previous cardiology note as well as ED note please see HPI   Co morbidities that complicate the patient evaluation  Obesity, A. fib, CHF  Social Determinants of Health:  N/A    Lab Tests:  I Ordered, and personally interpreted labs.  The pertinent results include: CBC shows leukopenia white count of 3.7 normocytic anemia hemoglobin 11.6, BMP shows glucose of 124 BUN 39 creatinine 2.98 baseline seems to be 2.7 BNP 1500 baseline appears to be at thousand for troponin is 42   Imaging Studies ordered:  I ordered imaging studies including chest x-ray I independently visualized and interpreted imaging which showed reveals vascular congestion I agree with the radiologist interpretation   Cardiac Monitoring:  The patient was maintained on a cardiac monitor.  I personally viewed and interpreted the cardiac monitored which showed an underlying rhythm of: A flutter without signs of ischemia.   Medicines ordered and prescription drug management:  I ordered medication including Lasix for diuresis I have reviewed the patients home medicines and have made adjustments as needed   Reevaluation:  After the interventions noted above, I reevaluated the patient and found that they have :improved  Patient is reassessed after IV Lasix, he is having good response, good urine output, I recommend admission since he is a bounce back with an elevated creatinine and BNP patient very high risk.  He is agreement this plan will consult with cardiology for further recommendations.  Consultations Obtained:  I requested consultation  with the spoke with Dr. Gaynelle Arabian  and cardiology,  and discussed lab and imaging findings as well as pertinent plan - they recommend: Recommends admission, he can go  to the hospitalist team, they will be available for consultation as needed.  Spoke with Dr. Bridgett Larsson who will admit the patient.   Rule out I have low suspicion for ACS as history is atypical, EKG is without signs of ischemia, troponins are slightly elevated with APAP at his baseline.  I suspect elevation is likely due to poor creatinine function.  Low suspicion for PE as patient denies pleuritic chest pain, vital signs reassuring nontachypneic, nonhypoxic, no unilateral leg swelling.  low suspicion for AAA or aortic dissection as history is atypical, patient has low risk factors.  Low suspicion for systemic infection as patient is nontoxic-appearing, vital signs reassuring, no obvious source infection noted on exam.     Dispostion and problem list  After consideration of the diagnostic results and the patients response to treatment, I feel that the patent would benefit from admission   Acute chronic CHF exacerbation-patient will need diuresis and continued kidney functions.             Final Clinical Impression(s) / ED Diagnoses Final diagnoses:  Congestive heart failure, unspecified HF chronicity, unspecified heart failure type [facility])    Rx / DC Orders ED Discharge Orders     None         Marcello Fennel, PA-C 04/14/21 0301    Mesner, Corene Cornea, MD 04/14/21 [phone]

## 2021-04-14 NOTE — Assessment & Plan Note (Signed)
Pt states he feels better when he is in NSR. He has had DCCV in the past. Will consult cardiology for DCCV. Pt states he has not missed any doses of his Xarelto.

## 2021-04-14 NOTE — Assessment & Plan Note (Signed)
Continue Xarelto 

## 2021-04-14 NOTE — ED Notes (Signed)
RT called for CPAP

## 2021-04-14 NOTE — ED Notes (Signed)
Pt calling out requesting CPAP. Primary RN and MD made aware.

## 2021-04-14 NOTE — Assessment & Plan Note (Signed)
Chronic. Monitor Scr during diuresis. 

## 2021-04-14 NOTE — Progress Notes (Signed)
Patient with medical history including A. fib a flutter CHF COPD obesity OSA presents with chief complaint of shortness of breath and heart palpitations.  Patient states heart palpitations going on since last Monday, he states they have remained constant, he states he goes between sinus rhythm and A. fib, he states that she  felt more short of breath,  especially on exertion, he endorses worsening orthopnea but denies worsening peripheral edema.  States that he is on torsemide he takes 2 times daily, he notes that he came to ED last week for similar presentation was given Lasix and then later discharged home.  He states he is feeling worse from prior.  He states he still making urine he is having no chest pain at this time no fevers, chills, nausea, vomiting, general body aches.   After reviewing patient's chart he was seen on 01/16 lab work revealed a creatinine which was at his baseline troponins which are at baseline as well as a BNP he was given 80 of Lasix and then was sent home.  04/14/2021: Patient was seen and examined at his bedside in the ED.  Reports dyspnea with minimal ambulation.  No chest pain at the time of this visit.  Seen by cardiology, appreciate recommendations.  Please refer to H&P dated by my partner Dr. Tonie Griffith on 04/14/2021 for further details of the assessment and plan

## 2021-04-14 NOTE — H&P (Signed)
History and Physical    Phillip [name] KYH:[id number] DOB: June 04, 1960 DOA: 04/13/2021  PCP: [name], MD   Patient coming from: Home  I have personally briefly reviewed patient's old medical records in [place]  CC: SOB HPI: 61 year old African-American male with a history of A. fib, history of DVT, history of a chronic anticoagulation, history of chronic systolic heart failure EF of 45% who is followed by Dr. Haroldine Laws with the advanced heart failure clinic presents to the ER today with worsening shortness of breath.  Patient seen in the ER last week.  He was given an IV dose of Lasix.  He was sent home.  He was told to increase his torsemide dose at home.  He states that he was noted to be in A. fib flutter in the ER.  He states that he feels much better when he is in normal sinus rhythm.  He is unable to contact cardiology this past week.  He came back to the ER due to feeling poorly.  Chest x-ray demonstrates cardiomegaly with minor vascular congestion.  BNP is elevated to 1550.  Baseline is approximately 1000.  EKG demonstrates A. fib.  Rate controlled.  Serum creatinine 2.98.  [facility] hospitalist contacted for admission.   ED Course: ekg afib. CXR shows CHF. BNP elevated.  Review of Systems:  Review of Systems  Constitutional: Negative.  Negative for chills and fever.  HENT: Negative.  Negative for ear pain, hearing loss and tinnitus.   Eyes: Negative.   Respiratory:  Positive for shortness of breath.   Cardiovascular:  Positive for orthopnea, leg swelling and PND. Negative for chest pain.  Gastrointestinal: Negative.   Genitourinary: Negative.   Musculoskeletal: Negative.   Skin: Negative.   Neurological: Negative.   Endo/Heme/Allergies: Negative.   Psychiatric/Behavioral: Negative.    All other systems reviewed and are negative.  Past Medical History:  Diagnosis Date   Asthma    CHF (congestive heart failure) ([place]) 11/15   EF 40-45%, improved with follow-up    Chronic kidney disease    COPD (chronic obstructive pulmonary disease) (HCC)    Deep vein thrombosis (DVT) of lower extremity ([place]) 08/17/2015   DVT (deep venous thrombosis) (HCC)    Gout    Hypertension    Hypertensive cardiovascular disease    Kidney dysfunction    Morbid obesity (HCC)    OSA on CPAP    Paroxysmal atrial fibrillation (HCC)    PNA (pneumonia)    Pulmonary embolism ([place]) 2010   left leg DVT   Sleep apnea    uses cpap    Past Surgical History:  Procedure Laterality Date   A-FLUTTER ABLATION N/A 07/22/2017   Procedure: A-FLUTTER ABLATION;  Surgeon: Thompson Grayer, MD;  Location: [place] [facility];  Service: Cardiovascular;  Laterality: N/A;   CARDIOVERSION N/A 10/14/2018   Procedure: CARDIOVERSION;  Surgeon: Jolaine Artist, MD;  Location: [place];  Service: Cardiovascular;  Laterality: N/A;   CARDIOVERSION N/A 04/22/2019   Procedure: CARDIOVERSION;  Surgeon: Thayer Headings, MD;  Location: [facility] ENDOSCOPY;  Service: Cardiovascular;  Laterality: N/A;   CARDIOVERSION N/A 05/10/2019   Procedure: CARDIOVERSION;  Surgeon: Jolaine Artist, MD;  Location: [place];  Service: Cardiovascular;  Laterality: N/A;   CARDIOVERSION N/A 11/23/2020   Procedure: CARDIOVERSION;  Surgeon: Pixie Casino, MD;  Location: [facility] ENDOSCOPY;  Service: Cardiovascular;  Laterality: N/A;   COLONOSCOPY WITH PROPOFOL N/A 12/20/2019   Procedure: COLONOSCOPY WITH PROPOFOL;  Surgeon: Yetta Flock, MD;  Location: [facility] ENDOSCOPY;  Service: Gastroenterology;  Laterality: N/A;   POLYPECTOMY  12/20/2019   Procedure: POLYPECTOMY;  Surgeon: Yetta Flock, MD;  Location: [facility]OSCOPY;  Service: Gastroenterology;;   [place]     reports that he quit smoking about 31 years ago. His smoking use included cigarettes. He has a 0.25 pack-year smoking history. He has never used smokeless tobacco. He reports that he does not drink alcohol and does not use  drugs.  Allergies  Allergen Reactions   Other Anaphylaxis    mushrooms    Family History  Problem Relation Age of Onset   Hypertension Mother    Diabetes Father    CAD Father    Clotting disorder Father    Heart disease Father        transplant   Stomach cancer Father    Allergies Brother    Colon cancer Neg Hx    Colon polyps Neg Hx    Esophageal cancer Neg Hx    Rectal cancer Neg Hx     Prior to Admission medications   Medication Sig Start Date End Date Taking? Authorizing Provider  albuterol (PROAIR HFA) 108 (90 Base) MCG/ACT inhaler Inhale 2 puffs into the lungs every 4 (four) hours as needed for wheezing or shortness of breath. Patient taking differently: Inhale 2 puffs into the lungs every 4 (four) hours as needed for shortness of breath. 05/14/18  Yes Tanda Rockers, MD  albuterol (PROVENTIL) (2.5 MG/3ML) 0.083% nebulizer solution INHALE 1 VIAL VIA NEBULIZER EVERY 6 HOURS AS NEEDED FOR WHEEZING/SHORTNESS OF BREATH. Patient taking differently: Take 2.5 mg by nebulization every 6 (six) hours as needed for shortness of breath. 02/22/21  Yes Tanda Rockers, MD  allopurinol (ZYLOPRIM) 300 MG tablet TAKE 1 TABLET BY MOUTH EVERY DAY Patient taking differently: Take 300 mg by mouth daily. 04/07/21  Yes [name], MD  amiodarone (PACERONE) 200 MG tablet Take 1 tablet (200 mg total) by mouth daily. 12/02/20  Yes Kroeger, Daleen Snook M., PA-C  budesonide-formoterol (SYMBICORT) 80-4.5 MCG/ACT inhaler Inhale 2 puffs into the lungs 2 (two) times daily. 12/13/20  Yes Martyn Ehrich, NP  carvedilol (COREG) 25 MG tablet Take 1 tablet (25 mg total) by mouth 2 (two) times daily with a meal. 11/24/20  Yes Kroeger, Daleen Snook M., PA-C  Cholecalciferol (VITAMIN D3) 50 MCG (2000 UT) TABS Take 2,000 Units by mouth daily.   Yes [provider]  colchicine 0.6 MG tablet Take 0.6 mg by mouth daily as needed (gout flares).  07/25/16  Yes [provider]  isosorbide mononitrate (IMDUR)  120 MG 24 hr tablet Take 1 tablet (120 mg total) by mouth daily. 11/25/20  Yes Kroeger, Daleen Snook M., PA-C  KLOR-CON M20 20 MEQ tablet TAKE 2 TABLETS (40 MEQ) EVERY MORNING AND 1 TABLET (20 MEQ) EVERY EVENING Patient taking differently: 20-40 mEq See admin instructions. 40 meq in the morning 20 meq in the evening 01/25/21  Yes Bensimhon, Shaune Pascal, MD  naproxen sodium (ALEVE) 220 MG tablet Take 440 mg by mouth daily as needed (pain/headache).   Yes [provider]  oxymetazoline (AFRIN NASAL SPRAY) 0.05 % nasal spray Place 1 spray into both nostrils 2 (two) times daily as needed for congestion. 03/12/17  Yes Oswald Hillock, MD  Probiotic Product (PROBIOTIC DAILY PO) Take 1 capsule by mouth daily.   Yes [provider]  psyllium (REGULOID) 0.52 g capsule Take 1.04 g by mouth daily.   Yes [provider]  torsemide (DEMADEX) 20 MG tablet Take 2 tablets (40 mg total) by mouth 2 (two) times daily. 03/22/21  Yes Bensimhon, Shaune Pascal, MD  UNABLE TO FIND Med Name: CPAP with sleep   Yes [provider]  XARELTO 20 MG TABS tablet TAKE 1 TABLET DAILY WITH SUPPER (NEEDS APPOINTMENT FOR FURTHER REFILLS) Patient taking differently: 20 mg daily with supper. 09/21/20  Yes Bensimhon, Shaune Pascal, MD  hydrALAZINE (APRESOLINE) 100 MG tablet TAKE 1 TABLET THREE TIMES A DAY (CHANGE IN DOSAGE) Patient not taking: Reported on 03/28/2021 06/04/20   Jolaine Artist, MD    Physical Exam: Vitals:   04/13/21 2244 04/13/21 2245 04/14/21 0019 04/14/21 0145  BP:  (!) 168/98 (!) 158/80 (!) 164/79  Pulse:  82 72 67  Resp:  (!) 21 18 20   SpO2:  97% 99% 98%  Weight: (!) 144 kg     Height: 5\' 9"  (1.753 m)       Physical Exam Vitals and nursing note reviewed.  Constitutional:      General: He is not in acute distress.    Appearance: He is obese. He is not ill-appearing, toxic-appearing or diaphoretic.  HENT:     Head: Normocephalic and atraumatic.     Nose: Nose normal. No rhinorrhea.  Eyes:      General: No scleral icterus. Cardiovascular:     Rate and Rhythm: Normal rate. Rhythm irregular.     Pulses: Normal pulses.  Pulmonary:     Effort: Pulmonary effort is normal.     Breath sounds: Normal breath sounds.  Abdominal:     General: Bowel sounds are normal. There is no distension.     Palpations: Abdomen is soft.     Tenderness: There is no abdominal tenderness. There is no guarding.  Musculoskeletal:     Comments: Trace pretibial edema bilaterally  Skin:    General: Skin is warm and dry.     Capillary Refill: Capillary refill takes less than 2 seconds.  Neurological:     General: No focal deficit present.     Mental Status: He is alert and oriented to person, place, and time.     Labs on Admission: I have personally reviewed following labs and imaging studies  CBC: Recent Labs  Lab 04/08/21 1345 04/13/21 2253  WBC 3.2* 3.7*  NEUTROABS  --  2.3  HGB 10.7* 11.6*  HCT 34.5* 36.6*  MCV 93.0 91.7  PLT 109* 546*   Basic Metabolic Panel: Recent Labs  Lab 04/08/21 1345 04/13/21 2253  NA 143 141  K 3.9 4.0  CL 105 105  CO2 28 26  GLUCOSE 76 124*  BUN 35* 39*  CREATININE 2.76* 2.98*  CALCIUM 8.9 9.0   GFR: Estimated Creatinine Clearance: 38.2 mL/min (A) (by C-G formula based on SCr of 2.98 mg/dL (H)). Liver Function Tests: No results for input(s): AST, ALT, ALKPHOS, BILITOT, PROT, ALBUMIN in the last 168 hours. No results for input(s): LIPASE, AMYLASE in the last 168 hours. No results for input(s): AMMONIA in the last 168 hours. Coagulation Profile: No results for input(s): INR, PROTIME in the last 168 hours. Cardiac Enzymes: No results for input(s): CKTOTAL, CKMB, CKMBINDEX, TROPONINI in the last 168 hours. BNP (last 3 results) No results for input(s): PROBNP in the last 8760 hours. HbA1C: No results for input(s): HGBA1C in the last 72 hours. CBG: No results for input(s): GLUCAP in the last 168 hours. Lipid Profile: No results for input(s): CHOL,  HDL, LDLCALC, TRIG,  CHOLHDL, LDLDIRECT in the last 72 hours. Thyroid Function Tests: No results for input(s): TSH, T4TOTAL, FREET4, T3FREE, THYROIDAB in the last 72 hours. Anemia Panel: No results for input(s): VITAMINB12, FOLATE, FERRITIN, TIBC, IRON, RETICCTPCT in the last 72 hours. Urine analysis:    Component Value Date/Time   COLORURINE STRAW (A) 09/17/2017 2350   APPEARANCEUR CLEAR 09/17/2017 2350   LABSPEC 1.009 09/17/2017 2350   PHURINE 5.0 09/17/2017 2350   GLUCOSEU NEGATIVE 09/17/2017 2350   HGBUR NEGATIVE 09/17/2017 2350   BILIRUBINUR NEGATIVE 09/17/2017 2350   KETONESUR NEGATIVE 09/17/2017 2350   PROTEINUR NEGATIVE 09/17/2017 2350   UROBILINOGEN 1.0 12/04/2014 0546   NITRITE NEGATIVE 09/17/2017 2350   LEUKOCYTESUR NEGATIVE 09/17/2017 2350    Radiological Exams on Admission: I have personally reviewed images DG Chest 2 View  Result Date: 04/13/2021 CLINICAL DATA:  Shortness of breath. EXAM: CHEST - 2 VIEW COMPARISON:  Chest radiograph dated 04/08/2021. FINDINGS: Similar cardiomegaly with mild vascular congestion. No focal consolidation, pleural effusion or pneumothorax. No acute osseous pathology. IMPRESSION: Cardiomegaly with mild vascular congestion. No focal consolidation. Electronically Signed   By: Anner Crete M.D.   On: 04/13/2021 23:09    EKG: I have personally reviewed EKG: afib   Assessment/Plan Principal Problem:   Acute on chronic systolic CHF (congestive heart failure) (HCC) Active Problems:   Paroxysmal atrial fibrillation (HCC)   Chronic kidney disease, stage IV (severe) (HCC)   OSA on CPAP   Essential hypertension   Morbid obesity (HCC)   Anticoagulated    Acute on chronic systolic CHF (congestive heart failure) ([place]) Admit to telemetry bed. Observation. Continue with IV diuresis with lasix. Monitor Scr.  Paroxysmal atrial fibrillation (HCC) Pt states he feels better when he is in NSR. He has had DCCV in the past. Will consult cardiology  for DCCV. Pt states he has not missed any doses of his Xarelto.  Chronic kidney disease, stage IV (severe) (HCC) Chronic. Monitor Scr during diuresis.  OSA on CPAP Stable. Continue CPAP.  Essential hypertension Stable.  Morbid obesity (HCC) Chronic.  Anticoagulated Continue Xarelto.  DVT prophylaxis: Xarelto Code Status: Full Code Family Communication: no family at bedside  Disposition Plan: return home  Consults called: cardiology  Admission status: Observation, Telemetry bed   Kristopher Oppenheim, DO [facility] 04/14/2021, 3:04 AM

## 2021-04-14 NOTE — Consult Note (Addendum)
Cardiology Consultation:   Patient ID: Phillip [name] MRN: [id number]; DOB: [birth year]  Admit date: 04/13/2021 Date of Consult: 04/14/2021  PCP:  Phillip Carbon, MD   [facility] Providers Cardiologist:  None  Advanced Heart Failure:  Phillip Bickers, MD       Patient Profile:   Phillip [name] is a 61 y.o. male with a hx of LV HFrEF, presumed non-ischemic, following at AHF clinic, CKD4, severe obesity, paroxysmal afib and flutter s/p AFL ablation 2019 on amiodarone who is being seen 04/14/2021 for the evaluation of AEHF at the request of Dr Phillip [name] and PA Phillip [name].  History of Present Illness:   [name] to the ED 04/08/2021 where he was found to be mildly volume overloaded and in rate-controlled flutter; given Iv diuretics and discharged from the ED. Although he actually lost 8 pounds, he has felt that he has increasing fluid buildup and dyspnea over this timeframe despite taking home torsemide 40 bid. No CP, no fever/cough. Palpitations ever since flutter recurred on the 16th (he feels highly symptomatic from flutter and feels it provokes his HF). Presented to the ED. Workup notable for rate-controlled typical flutter, BNP 1500 from 1000 6 days earlier, stable Cr of 3, stable troponins of 37/41 from baseline. Given lasix 80 mg Iv and admitted   Past Medical History:  Diagnosis Date   Asthma    CHF (congestive heart failure) ([place]) 11/15   EF 40-45%, improved with follow-up   Chronic kidney disease    COPD (chronic obstructive pulmonary disease) (HCC)    DVT (deep venous thrombosis) (HCC)    Gout    Hypertension    Hypertensive cardiovascular disease    Kidney dysfunction    Morbid obesity ([place])    OSA on CPAP    Paroxysmal atrial fibrillation (HCC)    PNA (pneumonia)    Pulmonary embolism ([place]) 2010   left leg DVT   Sleep apnea    uses cpap    Past Surgical History:  Procedure Laterality Date   A-FLUTTER ABLATION N/A 07/22/2017   Procedure: A-FLUTTER  ABLATION;  Surgeon: Phillip Grayer, MD;  Location: [place] [facility];  Service: Cardiovascular;  Laterality: N/A;   CARDIOVERSION N/A 10/14/2018   Procedure: CARDIOVERSION;  Surgeon: Phillip Artist, MD;  Location: [place];  Service: Cardiovascular;  Laterality: N/A;   CARDIOVERSION N/A 04/22/2019   Procedure: CARDIOVERSION;  Surgeon: Phillip Headings, MD;  Location: [facility] ENDOSCOPY;  Service: Cardiovascular;  Laterality: N/A;   CARDIOVERSION N/A 05/10/2019   Procedure: CARDIOVERSION;  Surgeon: Phillip Artist, MD;  Location: [place];  Service: Cardiovascular;  Laterality: N/A;   CARDIOVERSION N/A 11/23/2020   Procedure: CARDIOVERSION;  Surgeon: Phillip Casino, MD;  Location: [facility] ENDOSCOPY;  Service: Cardiovascular;  Laterality: N/A;   COLONOSCOPY WITH PROPOFOL N/A 12/20/2019   Procedure: COLONOSCOPY WITH PROPOFOL;  Surgeon: Phillip Flock, MD;  Location: [facility] ENDOSCOPY;  Service: Gastroenterology;  Laterality: N/A;   POLYPECTOMY  12/20/2019   Procedure: POLYPECTOMY;  Surgeon: Phillip Flock, MD;  Location: [facility] ENDOSCOPY;  Service: Gastroenterology;;   UMBILICAL HERNIA REPAIR  1992       Inpatient Medications: Scheduled Meds:  Continuous Infusions:  PRN Meds:   Allergies:    Allergies  Allergen Reactions   Other Anaphylaxis    mushrooms    Social History:   Social History   Socioeconomic History   Marital status: Divorced    Spouse name: Not on file   Number of children:  2   Years of education: 99   Highest education level: Not on file  Occupational History   Occupation: Drives dump truck    Comment:     Occupation: Airline pilot    Comment: Disabled  Tobacco Use   Smoking status: Former    Packs/day: 0.25    Years: 1.00    Pack years: 0.25    Types: Cigarettes    Quit date: 07/23/1989    Years since quitting: 31.7   Smokeless tobacco: Never   Tobacco comments:    Former smoker 03/06/2021  Vaping Use   Vaping Use: Never used  Substance and  Sexual Activity   Alcohol use: No    Alcohol/week: 0.0 standard drinks   Drug use: No   Sexual activity: Not Currently  Other Topics Concern   Not on file  Social History Narrative   Pt lives in [place], alone.   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   Has 2 sons in [place]      No living will   Would want sons to make decisions for him   Would accept resuscitation   Social Determinants of Health   Financial Resource Strain: Not on file  Food Insecurity: Not on file  Transportation Needs: Not on file  Physical Activity: Not on file  Stress: Not on file  Social Connections: Not on file  Intimate Partner Violence: Not on file    Family History:    Family History  Problem Relation Age of Onset   Hypertension Mother    Diabetes Father    CAD Father    Clotting disorder Father    Heart disease Father        transplant   Stomach cancer Father    Allergies Brother    Colon cancer Neg Hx    Colon polyps Neg Hx    Esophageal cancer Neg Hx    Rectal cancer Neg Hx      ROS:  Please see the history of present illness.   All other ROS reviewed and negative.     Physical Exam/Data:   Vitals:   04/13/21 2244 04/13/21 2245 04/14/21 0019  BP:  (!) 168/98 (!) 158/80  Pulse:  82 72  Resp:  (!) 21 18  SpO2:  97% 99%  Weight: (!) 144 kg    Height: 5\' 9"  (1.753 m)     No intake or output data in the 24 hours ending 04/14/21 0133 Last 3 Weights 04/13/2021 03/28/2021 03/06/2021  Weight (lbs) 317 lb 7.4 oz 319 lb 320 lb  Weight (kg) 144 kg 144.697 kg 145.151 kg     Body mass index is 46.88 kg/m.  General:  Well nourished, well developed, in no acute distress HEENT: normal Neck: JVP 12 cm above sternal notch at 45 degrees with positive AJR Vascular: No carotid bruits; Distal pulses 2+ bilaterally Cardiac:  normal S1, S2; IRR ~70 bpm; no murmur  Lungs:  clear to auscultation bilaterally, no wheezing, rhonchi or rales  Abd: soft, nontender,  mild distenstion  Ext: no edema Musculoskeletal:  No deformities, BUE and BLE strength normal and equal Skin: warm and dry  Neuro:  CNs 2-12 intact, no focal abnormalities noted Psych:  Normal affect     Laboratory Data:  High Sensitivity Troponin:   Recent Labs  Lab 04/08/21 1345 04/08/21 1610 04/13/21 2253  TROPONINIHS 37* 41* 42*     Chemistry Recent Labs  Lab 04/08/21 1345 04/13/21 2253  NA 143 141  K 3.9 4.0  CL 105 105  CO2 28 26  GLUCOSE 76 124*  BUN 35* 39*  CREATININE 2.76* 2.98*  CALCIUM 8.9 9.0  GFRNONAA 26* 24*  ANIONGAP 10 10    No results for input(s): PROT, ALBUMIN, AST, ALT, ALKPHOS, BILITOT in the last 168 hours. Lipids No results for input(s): CHOL, TRIG, HDL, LABVLDL, LDLCALC, CHOLHDL in the last 168 hours.  Hematology Recent Labs  Lab 04/08/21 1345 04/13/21 2253  WBC 3.2* 3.7*  RBC 3.71* 3.99*  HGB 10.7* 11.6*  HCT 34.5* 36.6*  MCV 93.0 91.7  MCH 28.8 29.1  MCHC 31.0 31.7  RDW 15.9* 15.8*  PLT 109* 138*   Thyroid No results for input(s): TSH, FREET4 in the last 168 hours.  BNP Recent Labs  Lab 04/08/21 1845 04/13/21 2253  BNP 1,026.9* 1,550.0*    DDimer No results for input(s): DDIMER in the last 168 hours.   Radiology/Studies:  DG Chest 2 View  Result Date: 04/13/2021 CLINICAL DATA:  Shortness of breath. EXAM: CHEST - 2 VIEW COMPARISON:  Chest radiograph dated 04/08/2021. FINDINGS: Similar cardiomegaly with mild vascular congestion. No focal consolidation, pleural effusion or pneumothorax. No acute osseous pathology. IMPRESSION: Cardiomegaly with mild vascular congestion. No focal consolidation. Electronically Signed   By: Anner Crete M.D.   On: 04/13/2021 23:09     Assessment and Plan:  Mr Verbrugge is a 50 YOM hx HFrEF, CKD4, obesity, pAfib/flutter presenting with AEHF and rate-controlled flutter. Wet and warm  AEHF, NYHA II-III, AHA C: He appears ~0 pounds volume up although exam challenging given body habitus. If  inadequate response to lasix 80 Iv plan for lasix 120 Iv + metolazone 5 mg PO. Continue coreg, imdur, hydralazine. No other GDMT given renal fxn pAfib/AFL: Suspect fib/flutter is a major contributor to HF exacerbations. Continue amiodarone, coreg, xarelto. Consider DCCV before discharge followed by doubling amiodarone to 200 mg PO BID (query whether higher dose would be more effective given size). Outpatient eval for redo CTI ablation (+/- PVI)   Risk Assessment/Risk Scores:        New York Heart Association (NYHA) Functional Class NYHA Class III  CHA2DS2-VASc Score = 2   This indicates a 2.2% annual risk of stroke. The patient's score is based upon: CHF History: 1 HTN History: 1 Diabetes History: 0 Stroke History: 0 Vascular Disease History: 0 Age Score: 0 Gender Score: 0     For questions or updates, please contact [place] Please consult [email or web address] for contact info under    Signed, Martinique Tannenbaum, MD  04/14/2021 1:33 AM   Attending Addendum:  History and all data above reviewed.  Patient examined.  I agree with the findings as above.   All available labs, radiology testing, previous records reviewed. Agree with documented assessment and plan. Mr. Kleinpeter is a 17M with chronic systolic and diastolic heart failure paroxysmal atrial fibrillation/flutter status post ablation, morbid obesity, and CKD 4 admitted with acute on chronic systolic massive heart failure and atrial flutter.  He has been seen in the ED last week with heart failure symptoms.  He was diuresed but remained in atrial flutter.  Since that time his symptoms have worsened.  He presented with worsening shortness of breath and edema.  So far he is feeling somewhat better with IV Lasix.  We will continue to diurese until he is more euvolemic.  Plan to pursue cardioversion prior to discharge.  Continue amiodarone, carvedilol, and Xarelto.  He has not missed  any doses of Xarelto.  Elder Davidian C. Oval Linsey, MD,  [facility] 04/14/2021 10:02 AM   Adrian Dinovo C. Oval Linsey, MD, [facility]  04/14/2021 10:01 AM

## 2021-04-14 NOTE — Progress Notes (Signed)
Per patient's request he was placed on CPAP. He does not know his CPAP settings for home just that they are "high" RT placed patient on a pressure of 14 and patient stated that felt good. Patient is connected to the monitor and call bell is in reach.

## 2021-04-14 NOTE — Assessment & Plan Note (Signed)
Admit to telemetry bed. Observation. Continue with IV diuresis with lasix. Monitor Scr.

## 2021-04-15 ENCOUNTER — Encounter (HOSPITAL_COMMUNITY): Payer: Self-pay | Admitting: Internal Medicine

## 2021-04-15 ENCOUNTER — Observation Stay (HOSPITAL_COMMUNITY): Payer: Managed Care, Other (non HMO)

## 2021-04-15 DIAGNOSIS — Z87891 Personal history of nicotine dependence: Secondary | ICD-10-CM | POA: Diagnosis not present

## 2021-04-15 DIAGNOSIS — Z6841 Body Mass Index (BMI) 40.0 and over, adult: Secondary | ICD-10-CM | POA: Diagnosis not present

## 2021-04-15 DIAGNOSIS — M109 Gout, unspecified: Secondary | ICD-10-CM | POA: Diagnosis present

## 2021-04-15 DIAGNOSIS — I5021 Acute systolic (congestive) heart failure: Secondary | ICD-10-CM

## 2021-04-15 DIAGNOSIS — Z7901 Long term (current) use of anticoagulants: Secondary | ICD-10-CM | POA: Diagnosis not present

## 2021-04-15 DIAGNOSIS — D631 Anemia in chronic kidney disease: Secondary | ICD-10-CM | POA: Diagnosis present

## 2021-04-15 DIAGNOSIS — Z9989 Dependence on other enabling machines and devices: Secondary | ICD-10-CM

## 2021-04-15 DIAGNOSIS — D61818 Other pancytopenia: Secondary | ICD-10-CM | POA: Diagnosis present

## 2021-04-15 DIAGNOSIS — Z8249 Family history of ischemic heart disease and other diseases of the circulatory system: Secondary | ICD-10-CM | POA: Diagnosis not present

## 2021-04-15 DIAGNOSIS — G4733 Obstructive sleep apnea (adult) (pediatric): Secondary | ICD-10-CM | POA: Diagnosis present

## 2021-04-15 DIAGNOSIS — I10 Essential (primary) hypertension: Secondary | ICD-10-CM

## 2021-04-15 DIAGNOSIS — Z7951 Long term (current) use of inhaled steroids: Secondary | ICD-10-CM | POA: Diagnosis not present

## 2021-04-15 DIAGNOSIS — Z86711 Personal history of pulmonary embolism: Secondary | ICD-10-CM | POA: Diagnosis not present

## 2021-04-15 DIAGNOSIS — Z91018 Allergy to other foods: Secondary | ICD-10-CM | POA: Diagnosis not present

## 2021-04-15 DIAGNOSIS — N184 Chronic kidney disease, stage 4 (severe): Secondary | ICD-10-CM

## 2021-04-15 DIAGNOSIS — R001 Bradycardia, unspecified: Secondary | ICD-10-CM | POA: Diagnosis not present

## 2021-04-15 DIAGNOSIS — I130 Hypertensive heart and chronic kidney disease with heart failure and stage 1 through stage 4 chronic kidney disease, or unspecified chronic kidney disease: Secondary | ICD-10-CM | POA: Diagnosis present

## 2021-04-15 DIAGNOSIS — I480 Paroxysmal atrial fibrillation: Secondary | ICD-10-CM

## 2021-04-15 DIAGNOSIS — Z79899 Other long term (current) drug therapy: Secondary | ICD-10-CM | POA: Diagnosis not present

## 2021-04-15 DIAGNOSIS — I5023 Acute on chronic systolic (congestive) heart failure: Secondary | ICD-10-CM | POA: Diagnosis present

## 2021-04-15 DIAGNOSIS — N179 Acute kidney failure, unspecified: Secondary | ICD-10-CM | POA: Diagnosis not present

## 2021-04-15 DIAGNOSIS — E876 Hypokalemia: Secondary | ICD-10-CM | POA: Diagnosis not present

## 2021-04-15 DIAGNOSIS — Z20822 Contact with and (suspected) exposure to covid-19: Secondary | ICD-10-CM | POA: Diagnosis present

## 2021-04-15 DIAGNOSIS — Z86718 Personal history of other venous thrombosis and embolism: Secondary | ICD-10-CM | POA: Diagnosis not present

## 2021-04-15 DIAGNOSIS — I4892 Unspecified atrial flutter: Secondary | ICD-10-CM | POA: Diagnosis present

## 2021-04-15 DIAGNOSIS — I5043 Acute on chronic combined systolic (congestive) and diastolic (congestive) heart failure: Secondary | ICD-10-CM | POA: Diagnosis present

## 2021-04-15 DIAGNOSIS — J449 Chronic obstructive pulmonary disease, unspecified: Secondary | ICD-10-CM | POA: Diagnosis present

## 2021-04-15 LAB — COMPREHENSIVE METABOLIC PANEL
ALT: 36 U/L (ref 0–44)
AST: 26 U/L (ref 15–41)
Albumin: 3 g/dL — ABNORMAL LOW (ref 3.5–5.0)
Alkaline Phosphatase: 68 U/L (ref 38–126)
Anion gap: 10 (ref 5–15)
BUN: 38 mg/dL — ABNORMAL HIGH (ref 6–20)
CO2: 27 mmol/L (ref 22–32)
Calcium: 8.5 mg/dL — ABNORMAL LOW (ref 8.9–10.3)
Chloride: 105 mmol/L (ref 98–111)
Creatinine, Ser: 2.9 mg/dL — ABNORMAL HIGH (ref 0.61–1.24)
GFR, Estimated: 24 mL/min — ABNORMAL LOW (ref >60)
Glucose, Bld: 79 mg/dL (ref 70–99)
Potassium: 3.5 mmol/L (ref 3.5–5.1)
Sodium: 142 mmol/L (ref 135–145)
Total Bilirubin: 0.9 mg/dL (ref 0.3–1.2)
Total Protein: 5 g/dL — ABNORMAL LOW (ref 6.5–8.1)

## 2021-04-15 LAB — CBC WITH DIFFERENTIAL/PLATELET
Abs Immature Granulocytes: 0.01 10*3/uL (ref 0.00–0.07)
Basophils Absolute: 0 10*3/uL (ref 0.0–0.1)
Basophils Relative: 0 %
Eosinophils Absolute: 0.1 10*3/uL (ref 0.0–0.5)
Eosinophils Relative: 2 %
HCT: 34.8 % — ABNORMAL LOW (ref 39.0–52.0)
Hemoglobin: 10.8 g/dL — ABNORMAL LOW (ref 13.0–17.0)
Immature Granulocytes: 0 %
Lymphocytes Relative: 29 %
Lymphs Abs: 0.9 10*3/uL (ref 0.7–4.0)
MCH: 28.6 pg (ref 26.0–34.0)
MCHC: 31 g/dL (ref 30.0–36.0)
MCV: 92.1 fL (ref 80.0–100.0)
Monocytes Absolute: 0.3 10*3/uL (ref 0.1–1.0)
Monocytes Relative: 10 %
Neutro Abs: 1.8 10*3/uL (ref 1.7–7.7)
Neutrophils Relative %: 59 %
Platelets: 105 10*3/uL — ABNORMAL LOW (ref 150–400)
RBC: 3.78 MIL/uL — ABNORMAL LOW (ref 4.22–5.81)
RDW: 15.9 % — ABNORMAL HIGH (ref 11.5–15.5)
WBC: 3.1 10*3/uL — ABNORMAL LOW (ref 4.0–10.5)
nRBC: 0 % (ref 0.0–0.2)

## 2021-04-15 LAB — ECHOCARDIOGRAM COMPLETE
AR max vel: 2.83 cm2
AV Area VTI: 3 cm2
AV Area mean vel: 2.63 cm2
AV Mean grad: 5 mmHg
AV Peak grad: 10.2 mmHg
Ao pk vel: 1.6 m/s
Calc EF: 39.6 %
Height: 69 in
MV VTI: 0.54 cm2
S' Lateral: 4.3 cm
Single Plane A2C EF: 36.2 %
Single Plane A4C EF: 42.8 %
Weight: 5079.4 oz

## 2021-04-15 LAB — MAGNESIUM: Magnesium: 2.2 mg/dL (ref 1.7–2.4)

## 2021-04-15 NOTE — Progress Notes (Signed)
° ° °  Echocardiogram 2D Echocardiogram has been performed.  Beryle Beams 04/15/2021, 2:12 PM

## 2021-04-15 NOTE — Progress Notes (Signed)
Pt is scheduled for DCCV only with Dr. Harriet Masson tomorrow at [place].  NPO at [facility].

## 2021-04-15 NOTE — Progress Notes (Signed)
PROGRESS NOTE  Phillip [name] FHL:[id number] DOB: 14-Nov-1960 DOA: 04/13/2021 PCP: [name], MD  HPI/Recap of past 78 hours: 61 year old male with medical history including paroxysmal A. fib on Xarelto, a flutter, chronic systolic CHF, COPD, morbid obesity, OSA on CPAP, presents with chief complaint of shortness of breath and heart palpitations.  Work-up revealed acute exacerbation of heart failure.  Seen by cardiology, appreciate recommendations.  Ongoing diuresing with IV Lasix.   04/15/2021: Patient was seen at his bedside in the ED.  States he is very angry because he is still here in the ED.  Attempted to explain to the patient that he will be moved to the floor once a bed is available.  Patient repeatedly expresses that he is very angry about still being in the ED.    Assessment/Plan: Principal Problem:   Acute on chronic systolic CHF (congestive heart failure) (HCC) Active Problems:   OSA on CPAP   Chronic kidney disease, stage IV (severe) (HCC)   Essential hypertension   Morbid obesity (HCC)   Paroxysmal atrial fibrillation (HCC)   Anticoagulated  Acute on chronic systolic CHF Last 2D echo done on 11/22/2020 showed LVEF 45 to 50%.  Left atrial size severely dilated, right atrial size moderately dilated. BNP greater than 1500 on presentation Ongoing diuresing with IV Lasix Cardiology consulted and following. Will need strict I's and O's and daily weight, ordered but urine output not documented.    Paroxysmal A. fib, rate controlled Currently on Coreg 25 mg twice daily, amiodarone 200 mg daily. Continue Xarelto for CVA prevention  CKD 4 Appears to be at his baseline creatinine 2.9 with GFR 24 Continue to avoid nephrotoxic agents. Monitor urine output with strict I's and O's.  Anemia of chronic disease Hemoglobin stable at 10.8 No overt bleeding.  Pancytopenia All 3 cell lines are down Repeat CBC in the morning Continue to closely monitor  Severe morbid  obesity BMI 46 Recommend weight loss outpatient regular physical activity and healthy dieting.  Code Status: Full code  Family Communication: None at bedside  Disposition Plan: Likely will return to home with cardiology signs off.   Consultants: Cardiology  Procedures: None  Antimicrobials: None  DVT prophylaxis: Xarelto  Status is: Observation        Objective: Vitals:   04/15/21 0507 04/15/21 0600 04/15/21 0700 04/15/21 0845  BP: (!) 171/95 (!) 151/78 (!) 173/92 (!) 177/94  Pulse: 67  68   Resp: (!) 22 18 18 20   Temp: 97.7 F (36.5 C)     TempSrc: Axillary     SpO2: 95%  96% 99%  Weight:      Height:        Intake/Output Summary (Last 24 hours) at 04/15/2021 3734 Last data filed at 04/15/2021 2876 Gross per 24 hour  Intake --  Output 2300 ml  Net -2300 ml   Filed Weights   04/13/21 2244  Weight: (!) 144 kg    Exam:  General: 61 y.o. year-old male well developed well nourished in no acute distress.  Alert and oriented x3. Cardiovascular: Regular rate and rhythm with no rubs or gallops.  No thyromegaly or JVD noted.   Respiratory: Clear to auscultation with no wheezes or rales. Good inspiratory effort. Abdomen: Soft nontender nondistended with normal bowel sounds x4 quadrants. Musculoskeletal: 1+ pitting edema lower EXTR bilaterally.   Skin: No ulcerative lesions noted or rashes, Psychiatry: Mood is irritable, angry.   Data Reviewed: CBC: Recent Labs  Lab 04/08/21 1345 04/13/21 2253  04/15/21 0500  WBC 3.2* 3.7* 3.1*  NEUTROABS  --  2.3 1.8  HGB 10.7* 11.6* 10.8*  HCT 34.5* 36.6* 34.8*  MCV 93.0 91.7 92.1  PLT 109* 138* 401*   Basic Metabolic Panel: Recent Labs  Lab 04/08/21 1345 04/13/21 2253 04/14/21 0229 04/15/21 0500  NA 143 141  --  142  K 3.9 4.0  --  3.5  CL 105 105  --  105  CO2 28 26  --  27  GLUCOSE 76 124*  --  79  BUN 35* 39*  --  38*  CREATININE 2.76* 2.98*  --  2.90*  CALCIUM 8.9 9.0  --  8.5*  MG  --   --   2.3 2.2   GFR: Estimated Creatinine Clearance: 39.3 mL/min (A) (by C-G formula based on SCr of 2.9 mg/dL (H)). Liver Function Tests: Recent Labs  Lab 04/15/21 0500  AST 26  ALT 36  ALKPHOS 68  BILITOT 0.9  PROT 5.0*  ALBUMIN 3.0*   No results for input(s): LIPASE, AMYLASE in the last 168 hours. No results for input(s): AMMONIA in the last 168 hours. Coagulation Profile: No results for input(s): INR, PROTIME in the last 168 hours. Cardiac Enzymes: No results for input(s): CKTOTAL, CKMB, CKMBINDEX, TROPONINI in the last 168 hours. BNP (last 3 results) No results for input(s): PROBNP in the last 8760 hours. HbA1C: No results for input(s): HGBA1C in the last 72 hours. CBG: No results for input(s): GLUCAP in the last 168 hours. Lipid Profile: No results for input(s): CHOL, HDL, LDLCALC, TRIG, CHOLHDL, LDLDIRECT in the last 72 hours. Thyroid Function Tests: No results for input(s): TSH, T4TOTAL, FREET4, T3FREE, THYROIDAB in the last 72 hours. Anemia Panel: No results for input(s): VITAMINB12, FOLATE, FERRITIN, TIBC, IRON, RETICCTPCT in the last 72 hours. Urine analysis:    Component Value Date/Time   COLORURINE STRAW (A) 09/17/2017 2350   APPEARANCEUR CLEAR 09/17/2017 2350   LABSPEC 1.009 09/17/2017 2350   PHURINE 5.0 09/17/2017 2350   GLUCOSEU NEGATIVE 09/17/2017 2350   HGBUR NEGATIVE 09/17/2017 2350   BILIRUBINUR NEGATIVE 09/17/2017 2350   KETONESUR NEGATIVE 09/17/2017 2350   PROTEINUR NEGATIVE 09/17/2017 2350   UROBILINOGEN 1.0 12/04/2014 0546   NITRITE NEGATIVE 09/17/2017 2350   LEUKOCYTESUR NEGATIVE 09/17/2017 2350   Sepsis Labs: @LABRCNTIP (procalcitonin:4,lacticidven:4)  ) Recent Results (from the past 240 hour(s))  Resp Panel by RT-PCR (Flu A&B, Covid) Nasopharyngeal Swab     Status: None   Collection Time: 04/14/21 12:27 AM   Specimen: Nasopharyngeal Swab; Nasopharyngeal(NP) swabs in vial transport medium  Result Value Ref Range Status   SARS Coronavirus 2  by RT PCR NEGATIVE NEGATIVE Final    Comment: (NOTE) SARS-CoV-2 target nucleic acids are NOT DETECTED.  The SARS-CoV-2 RNA is generally detectable in upper respiratory specimens during the acute phase of infection. The lowest concentration of SARS-CoV-2 viral copies this assay can detect is 138 copies/mL. A negative result does not preclude SARS-Cov-2 infection and should not be used as the sole basis for treatment or other patient management decisions. A negative result may occur with  improper specimen collection/handling, submission of specimen other than nasopharyngeal swab, presence of viral mutation(s) within the areas targeted by this assay, and inadequate number of viral copies(<138 copies/mL). A negative result must be combined with clinical observations, patient history, and epidemiological information. The expected result is Negative.  Fact Sheet for Patients:  EntrepreneurPulse.com.au  Fact Sheet for Healthcare Providers:  IncredibleEmployment.be  This test is no t yet  approved or cleared by the [place] and  has been authorized for detection and/or diagnosis of SARS-CoV-2 by FDA under an Emergency Use Authorization (EUA). This EUA will remain  in effect (meaning this test can be used) for the duration of the COVID-19 declaration under Section 564(b)(1) of the Act, 21 U.S.C.section 360bbb-3(b)(1), unless the authorization is terminated  or revoked sooner.       Influenza A by PCR NEGATIVE NEGATIVE Final   Influenza B by PCR NEGATIVE NEGATIVE Final    Comment: (NOTE) The Xpert Xpress SARS-CoV-2/FLU/RSV plus assay is intended as an aid in the diagnosis of influenza from Nasopharyngeal swab specimens and should not be used as a sole basis for treatment. Nasal washings and aspirates are unacceptable for Xpert Xpress SARS-CoV-2/FLU/RSV testing.  Fact Sheet for Patients: EntrepreneurPulse.com.au  Fact  Sheet for Healthcare Providers: IncredibleEmployment.be  This test is not yet approved or cleared by the [place] FDA and has been authorized for detection and/or diagnosis of SARS-CoV-2 by FDA under an Emergency Use Authorization (EUA). This EUA will remain in effect (meaning this test can be used) for the duration of the COVID-19 declaration under Section 564(b)(1) of the Act, 21 U.S.C. section 360bbb-3(b)(1), unless the authorization is terminated or revoked.  Performed at [facility], [place] [address] [place], [place] [address]       Studies: No results found.  Scheduled Meds:  amiodarone  200 mg Oral Daily   carvedilol  25 mg Oral BID WC   furosemide  40 mg Intravenous Q12H   isosorbide mononitrate  120 mg Oral Daily   rivaroxaban  20 mg Oral Q supper    Continuous Infusions:   LOS: 0 days     Kayleen Memos, MD [facility] Pager [phone]  If 7PM-7AM, please contact night-coverage [email or web address] Password [facility] 04/15/2021, 9:38 AM

## 2021-04-15 NOTE — H&P (View-Only) (Signed)
Progress Note  Patient Name: Phillip [name] Date of Encounter: 04/15/2021  [facility] Cardiologist: Glori Bickers, MD   Subjective   No chest pain, seen wearing CPAP  Inpatient Medications    Scheduled Meds:  amiodarone  200 mg Oral Daily   carvedilol  25 mg Oral BID WC   furosemide  40 mg Intravenous Q12H   isosorbide mononitrate  120 mg Oral Daily   rivaroxaban  20 mg Oral Q supper   Continuous Infusions:  PRN Meds: acetaminophen **OR** acetaminophen, ondansetron **OR** ondansetron (ZOFRAN) IV   Vital Signs    Vitals:   04/15/21 0600 04/15/21 0700 04/15/21 0845 04/15/21 1000  BP: (!) 151/78 (!) 173/92 (!) 177/94 (!) 155/92  Pulse:  68 70 75  Resp: 18 18 20  (!) 23  Temp:      TempSrc:      SpO2:  96% 99% 96%  Weight:      Height:        Intake/Output Summary (Last 24 hours) at 04/15/2021 1216 Last data filed at 04/15/2021 0506 Gross per 24 hour  Intake --  Output 1000 ml  Net -1000 ml   Last 3 Weights 04/13/2021 03/28/2021 03/06/2021  Weight (lbs) 317 lb 7.4 oz 319 lb 320 lb  Weight (kg) 144 kg 144.697 kg 145.151 kg      Telemetry    Atrial flutter / course fib 50-80s, PVCs- Personally Reviewed  ECG    No new tracings - Personally Reviewed  Physical Exam   GEN: Obese male, No acute distress.   Neck: + JVD Cardiac: RRR, no murmurs, rubs, or gallops.  Respiratory: Clear to auscultation bilaterally. GI: Soft, nontender, non-distended  MS: No edema; No deformity. Neuro:  Nonfocal  Psych: Normal affect   Labs    High Sensitivity Troponin:   Recent Labs  Lab 04/08/21 1345 04/08/21 1610 04/13/21 2253 04/14/21 0110  TROPONINIHS 37* 41* 42* 37*     Chemistry Recent Labs  Lab 04/08/21 1345 04/13/21 2253 04/14/21 0229 04/15/21 0500  NA 143 141  --  142  K 3.9 4.0  --  3.5  CL 105 105  --  105  CO2 28 26  --  27  GLUCOSE 76 124*  --  79  BUN 35* 39*  --  38*  CREATININE 2.76* 2.98*  --  2.90*  CALCIUM 8.9 9.0  --  8.5*  MG  --   --   2.3 2.2  PROT  --   --   --  5.0*  ALBUMIN  --   --   --  3.0*  AST  --   --   --  26  ALT  --   --   --  36  ALKPHOS  --   --   --  68  BILITOT  --   --   --  0.9  GFRNONAA 26* 24*  --  24*  ANIONGAP 10 10  --  10    Lipids No results for input(s): CHOL, TRIG, HDL, LABVLDL, LDLCALC, CHOLHDL in the last 168 hours.  Hematology Recent Labs  Lab 04/08/21 1345 04/13/21 2253 04/15/21 0500  WBC 3.2* 3.7* 3.1*  RBC 3.71* 3.99* 3.78*  HGB 10.7* 11.6* 10.8*  HCT 34.5* 36.6* 34.8*  MCV 93.0 91.7 92.1  MCH 28.8 29.1 28.6  MCHC 31.0 31.7 31.0  RDW 15.9* 15.8* 15.9*  PLT 109* 138* 105*   Thyroid No results for input(s): TSH, FREET4 in the last 168 hours.  BNP Recent Labs  Lab 04/08/21 1845 04/13/21 2253  BNP 1,026.9* 1,550.0*    DDimer No results for input(s): DDIMER in the last 168 hours.   Radiology    DG Chest 2 View  Result Date: 04/13/2021 CLINICAL DATA:  Shortness of breath. EXAM: CHEST - 2 VIEW COMPARISON:  Chest radiograph dated 04/08/2021. FINDINGS: Similar cardiomegaly with mild vascular congestion. No focal consolidation, pleural effusion or pneumothorax. No acute osseous pathology. IMPRESSION: Cardiomegaly with mild vascular congestion. No focal consolidation. Electronically Signed   By: Anner Crete M.D.   On: 04/13/2021 23:09    Cardiac Studies   Echo 11/22/20:  1. Left ventricular ejection fraction, by estimation, is 45 to 50%. The  left ventricle has severely decreased function. The left ventricle  demonstrates global hypokinesis. The left ventricular internal cavity size  was severely dilated. There is mild  left ventricular hypertrophy. Left ventricular diastolic parameters are  indeterminate.   2. Right ventricular systolic function is normal. The right ventricular  size is normal.   3. Left atrial size was severely dilated.   4. Right atrial size was moderately dilated.   5. The mitral valve is normal in structure. Mild mitral valve  regurgitation.  No evidence of mitral stenosis.   6. The aortic valve is normal in structure. Aortic valve regurgitation  moderate eccentric anterior directed. No aortic stenosis is present.   7. The inferior vena cava is normal in size with greater than 50%  respiratory variability, suggesting right atrial pressure of 3 mmHg.   Patient Profile     61 y.o. male hx HFrEF, CKD4, obesity, pAfib/flutter presenting with AEHF and rate-controlled flutter. 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 Lenard Galloway - pt is eager for repeat DCCV tomorrow - compliant on CPAP - struggling with obesity   Morbid obesity - likely contributing to atrial arrhythmia   OSA on CPAP - compliant    May consider DCCV tomorrow.  NPO at [place].   For questions or updates, please contact [place] Please consult [email or web address] for contact info under        Signed, Ledora Bottcher, PA  04/15/2021, 12:16 PM

## 2021-04-15 NOTE — ED Notes (Signed)
Breakfast order placed ?

## 2021-04-15 NOTE — ED Notes (Addendum)
Patient given toothbrush, toothpaste, and supplies to take a bedside bath. Pt temporarily removed from the monitor at this time.

## 2021-04-15 NOTE — Progress Notes (Signed)
Progress Note  Patient Name: Phillip [name] Date of Encounter: 04/15/2021  [facility] Cardiologist: Glori Bickers, MD   Subjective   No chest pain, seen wearing CPAP  Inpatient Medications    Scheduled Meds:  amiodarone  200 mg Oral Daily   carvedilol  25 mg Oral BID WC   furosemide  40 mg Intravenous Q12H   isosorbide mononitrate  120 mg Oral Daily   rivaroxaban  20 mg Oral Q supper   Continuous Infusions:  PRN Meds: acetaminophen **OR** acetaminophen, ondansetron **OR** ondansetron (ZOFRAN) IV   Vital Signs    Vitals:   04/15/21 0600 04/15/21 0700 04/15/21 0845 04/15/21 1000  BP: (!) 151/78 (!) 173/92 (!) 177/94 (!) 155/92  Pulse:  68 70 75  Resp: 18 18 20  (!) 23  Temp:      TempSrc:      SpO2:  96% 99% 96%  Weight:      Height:        Intake/Output Summary (Last 24 hours) at 04/15/2021 1216 Last data filed at 04/15/2021 0506 Gross per 24 hour  Intake --  Output 1000 ml  Net -1000 ml   Last 3 Weights 04/13/2021 03/28/2021 03/06/2021  Weight (lbs) 317 lb 7.4 oz 319 lb 320 lb  Weight (kg) 144 kg 144.697 kg 145.151 kg      Telemetry    Atrial flutter / course fib 50-80s, PVCs- Personally Reviewed  ECG    No new tracings - Personally Reviewed  Physical Exam   GEN: Obese male, No acute distress.   Neck: + JVD Cardiac: RRR, no murmurs, rubs, or gallops.  Respiratory: Clear to auscultation bilaterally. GI: Soft, nontender, non-distended  MS: No edema; No deformity. Neuro:  Nonfocal  Psych: Normal affect   Labs    High Sensitivity Troponin:   Recent Labs  Lab 04/08/21 1345 04/08/21 1610 04/13/21 2253 04/14/21 0110  TROPONINIHS 37* 41* 42* 37*     Chemistry Recent Labs  Lab 04/08/21 1345 04/13/21 2253 04/14/21 0229 04/15/21 0500  NA 143 141  --  142  K 3.9 4.0  --  3.5  CL 105 105  --  105  CO2 28 26  --  27  GLUCOSE 76 124*  --  79  BUN 35* 39*  --  38*  CREATININE 2.76* 2.98*  --  2.90*  CALCIUM 8.9 9.0  --  8.5*  MG  --   --   2.3 2.2  PROT  --   --   --  5.0*  ALBUMIN  --   --   --  3.0*  AST  --   --   --  26  ALT  --   --   --  36  ALKPHOS  --   --   --  68  BILITOT  --   --   --  0.9  GFRNONAA 26* 24*  --  24*  ANIONGAP 10 10  --  10    Lipids No results for input(s): CHOL, TRIG, HDL, LABVLDL, LDLCALC, CHOLHDL in the last 168 hours.  Hematology Recent Labs  Lab 04/08/21 1345 04/13/21 2253 04/15/21 0500  WBC 3.2* 3.7* 3.1*  RBC 3.71* 3.99* 3.78*  HGB 10.7* 11.6* 10.8*  HCT 34.5* 36.6* 34.8*  MCV 93.0 91.7 92.1  MCH 28.8 29.1 28.6  MCHC 31.0 31.7 31.0  RDW 15.9* 15.8* 15.9*  PLT 109* 138* 105*   Thyroid No results for input(s): TSH, FREET4 in the last 168 hours.  BNP Recent Labs  Lab 04/08/21 1845 04/13/21 2253  BNP 1,026.9* 1,550.0*    DDimer No results for input(s): DDIMER in the last 168 hours.   Radiology    DG Chest 2 View  Result Date: 04/13/2021 CLINICAL DATA:  Shortness of breath. EXAM: CHEST - 2 VIEW COMPARISON:  Chest radiograph dated 04/08/2021. FINDINGS: Similar cardiomegaly with mild vascular congestion. No focal consolidation, pleural effusion or pneumothorax. No acute osseous pathology. IMPRESSION: Cardiomegaly with mild vascular congestion. No focal consolidation. Electronically Signed   By: Anner Crete M.D.   On: 04/13/2021 23:09    Cardiac Studies   Echo 11/22/20:  1. Left ventricular ejection fraction, by estimation, is 45 to 50%. The  left ventricle has severely decreased function. The left ventricle  demonstrates global hypokinesis. The left ventricular internal cavity size  was severely dilated. There is mild  left ventricular hypertrophy. Left ventricular diastolic parameters are  indeterminate.   2. Right ventricular systolic function is normal. The right ventricular  size is normal.   3. Left atrial size was severely dilated.   4. Right atrial size was moderately dilated.   5. The mitral valve is normal in structure. Mild mitral valve  regurgitation.  No evidence of mitral stenosis.   6. The aortic valve is normal in structure. Aortic valve regurgitation  moderate eccentric anterior directed. No aortic stenosis is present.   7. The inferior vena cava is normal in size with greater than 50%  respiratory variability, suggesting right atrial pressure of 3 mmHg.   Patient Profile     61 y.o. male hx HFrEF, CKD4, obesity, pAfib/flutter presenting with AEHF and rate-controlled flutter. 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 Lenard Galloway - pt is eager for repeat DCCV tomorrow - compliant on CPAP - struggling with obesity   Morbid obesity - likely contributing to atrial arrhythmia   OSA on CPAP - compliant    May consider DCCV tomorrow.  NPO at MN.   For questions or updates, please contact [place] Please consult [email or web address] for contact info under        Signed, Ledora Bottcher, PA  04/15/2021, 12:16 PM

## 2021-04-16 ENCOUNTER — Inpatient Hospital Stay (HOSPITAL_COMMUNITY): Payer: Managed Care, Other (non HMO) | Admitting: Certified Registered Nurse Anesthetist

## 2021-04-16 ENCOUNTER — Encounter (HOSPITAL_COMMUNITY): Payer: Self-pay | Admitting: Internal Medicine

## 2021-04-16 ENCOUNTER — Encounter (HOSPITAL_COMMUNITY)
Admission: EM | Disposition: A | Discharge status: Home / Self Care | Payer: Self-pay | Source: Home / Self Care | Attending: Internal Medicine

## 2021-04-16 DIAGNOSIS — I4892 Unspecified atrial flutter: Secondary | ICD-10-CM | POA: Diagnosis not present

## 2021-04-16 DIAGNOSIS — I10 Essential (primary) hypertension: Secondary | ICD-10-CM | POA: Diagnosis not present

## 2021-04-16 DIAGNOSIS — I5023 Acute on chronic systolic (congestive) heart failure: Secondary | ICD-10-CM | POA: Diagnosis not present

## 2021-04-16 DIAGNOSIS — Z7901 Long term (current) use of anticoagulants: Secondary | ICD-10-CM | POA: Diagnosis not present

## 2021-04-16 DIAGNOSIS — N184 Chronic kidney disease, stage 4 (severe): Secondary | ICD-10-CM | POA: Diagnosis not present

## 2021-04-16 HISTORY — PX: CARDIOVERSION: SHX1299

## 2021-04-16 LAB — CBC
HCT: 34.7 % — ABNORMAL LOW (ref 39.0–52.0)
Hemoglobin: 10.7 g/dL — ABNORMAL LOW (ref 13.0–17.0)
MCH: 28.8 pg (ref 26.0–34.0)
MCHC: 30.8 g/dL (ref 30.0–36.0)
MCV: 93.3 fL (ref 80.0–100.0)
Platelets: 111 10*3/uL — ABNORMAL LOW (ref 150–400)
RBC: 3.72 MIL/uL — ABNORMAL LOW (ref 4.22–5.81)
RDW: 15.9 % — ABNORMAL HIGH (ref 11.5–15.5)
WBC: 3.2 10*3/uL — ABNORMAL LOW (ref 4.0–10.5)
nRBC: 0 % (ref 0.0–0.2)

## 2021-04-16 LAB — BASIC METABOLIC PANEL
Anion gap: 7 (ref 5–15)
BUN: 35 mg/dL — ABNORMAL HIGH (ref 6–20)
CO2: 29 mmol/L (ref 22–32)
Calcium: 8.5 mg/dL — ABNORMAL LOW (ref 8.9–10.3)
Chloride: 107 mmol/L (ref 98–111)
Creatinine, Ser: 3.05 mg/dL — ABNORMAL HIGH (ref 0.61–1.24)
GFR, Estimated: 23 mL/min — ABNORMAL LOW (ref >60)
Glucose, Bld: 82 mg/dL (ref 70–99)
Potassium: 3.4 mmol/L — ABNORMAL LOW (ref 3.5–5.1)
Sodium: 143 mmol/L (ref 135–145)

## 2021-04-16 LAB — PROTIME-INR
INR: 2.5 — ABNORMAL HIGH (ref 0.8–1.2)
Prothrombin Time: 27 seconds — ABNORMAL HIGH (ref 11.4–15.2)

## 2021-04-16 LAB — MAGNESIUM: Magnesium: 2.2 mg/dL (ref 1.7–2.4)

## 2021-04-16 LAB — PHOSPHORUS: Phosphorus: 3.8 mg/dL (ref 2.5–4.6)

## 2021-04-16 SURGERY — CARDIOVERSION
Anesthesia: General

## 2021-04-16 MED ORDER — CARVEDILOL 12.5 MG PO TABS
12.5000 mg | ORAL_TABLET | Freq: Two times a day (BID) | ORAL | Status: DC
  Administered 2021-04-16 – 2021-04-18 (×4): 12.5 mg via ORAL
  Filled 2021-04-16 (×4): qty 1

## 2021-04-16 MED ORDER — LIDOCAINE 2% (20 MG/ML) 5 ML SYRINGE
INTRAMUSCULAR | Status: DC | PRN
  Administered 2021-04-16: 100 mg via INTRAVENOUS

## 2021-04-16 MED ORDER — HYDRALAZINE HCL 50 MG PO TABS
50.0000 mg | ORAL_TABLET | Freq: Three times a day (TID) | ORAL | Status: DC
  Administered 2021-04-16 – 2021-04-17 (×3): 50 mg via ORAL
  Filled 2021-04-16 (×3): qty 1

## 2021-04-16 MED ORDER — PROPOFOL 10 MG/ML IV BOLUS
INTRAVENOUS | Status: DC | PRN
  Administered 2021-04-16: 80 mg via INTRAVENOUS

## 2021-04-16 MED ORDER — SODIUM CHLORIDE 0.9 % IV SOLN: INTRAVENOUS | Status: DC | PRN

## 2021-04-16 MED ORDER — ISOSORBIDE MONONITRATE ER 60 MG PO TB24
60.0000 mg | ORAL_TABLET | Freq: Every day | ORAL | Status: DC
  Administered 2021-04-17: 10:00:00 60 mg via ORAL
  Filled 2021-04-16: qty 1

## 2021-04-16 MED ORDER — SODIUM CHLORIDE 0.9 % IV SOLN: INTRAVENOUS | Status: DC

## 2021-04-16 MED ORDER — POTASSIUM CHLORIDE CRYS ER 20 MEQ PO TBCR
40.0000 meq | EXTENDED_RELEASE_TABLET | Freq: Once | ORAL | Status: AC
  Administered 2021-04-16: 15:00:00 40 meq via ORAL
  Filled 2021-04-16: qty 2

## 2021-04-16 NOTE — Progress Notes (Signed)
Heart Failure Navigator Progress Note  Assessed for Heart & Vascular TOC clinic readiness.  Patient does not meet criteria due to prior to hospitalization pt established with AHF clinic, Dr. Haroldine Laws 03/06/2021.   Navigator available for educational resources.  Pricilla Holm, MSN, RN Heart Failure Nurse Navigator [phone]

## 2021-04-16 NOTE — Interval H&P Note (Signed)
History and Physical Interval Note:  04/16/2021 9:47 AM  Phillip [name]  has presented today for surgery, with the diagnosis of AFIBC.  The various methods of treatment have been discussed with the patient and family. After consideration of risks, benefits and other options for treatment, the patient has consented to  Procedure(s): CARDIOVERSION (N/A) as a surgical intervention.  The patient's history has been reviewed, patient examined, no change in status, stable for surgery.  I have reviewed the patient's chart and labs.  Questions were answered to the patient's satisfaction.     Adib Wahba

## 2021-04-16 NOTE — ED Notes (Signed)
Per Floor nurse, room is not ready yet, no bed in it, will tranfer pt when room is ready, ED charge nurse aware of reason for delay

## 2021-04-16 NOTE — Care Management (Signed)
°  Transition of Care (TOC) Screening Note   Patient Details  Name: Phillip [name] Date of Birth: 12-16-1960   Transition of Care [facility] CM/SW Contact:    Bethena Roys, RN Phone Number: 04/16/2021, 3:00 PM    Transition of Care Department [facility]) has reviewed the patient and no TOC needs have been identified at this time. We will continue to monitor patient advancement through interdisciplinary progression rounds. If new patient transition needs arise, please place a TOC consult.

## 2021-04-16 NOTE — Progress Notes (Signed)
PROGRESS NOTE    Phillip [name]  QAS:[id number] DOB: [birth year] DOA: 04/13/2021 PCP: [name], MD   Chief Complain: Shortness of breath, palpitations  Brief Narrative: Patient is a 61 year old male with history of paroxysmal A. fib on Xarelto, chronic systolic  congestive heart failure COPD, obesity, OSA on CPAP who presented with shortness of breath, palpitations.  He follows with advanced heart failure clinic.  On presentation, chest x-ray showed cardiomegaly with vascular congestion.  BNP was elevated.  EKG showed A. fib with RVR.Patient was admitted for the management of acute on chronic systolic congestive heart failure, A. fib with RVR.  Cardiology consulted.  Underwent DCCV today  Assessment & Plan:   Principal Problem:   Acute on chronic systolic CHF (congestive heart failure) (HCC) Active Problems:   OSA on CPAP   Chronic kidney disease, stage IV (severe) (HCC)   Essential hypertension   Morbid obesity (HCC)   Paroxysmal atrial fibrillation (HCC)   Anticoagulated  Acute on chronic systolic congestive heart failure: Echo showed EF of 40 to 45%, same as before.  Patient with shortness of breath.  Elevated BNP.  Looks volume overloaded on presentation.  Chest x-ray showed vascular congestion.  Initially started on IV Lasix.  Currently on hold. Continue carvedilol.  Plan for resuming oral diuretics in the morning.  Paroxysmal A. fib/A flutter: Underwent cardioversion today.  Now in sinus bradycardia.  Dose of carvedilol decreased.  Continue amiodarone.  On Xarelto for anticoagulation  CKD stage IV: Baseline creatinine around 2.9.  Creatinine slightly trended up today.  Avoid nephrotoxins.  Diuretics on hold.  Check BMP tomorrow  Pancytopenia/Normocytic anemia: Anemia associated  with chronic disease/CKD.  Currently hemoglobin stable in the range of 10.  Hypokalemia: Supplement with potassium.  Morbid obesity: BMI 46.8          DVT prophylaxis:Eliquis Code Status:  Full Family Communication: None at bedside Patient status:Inpatient  Dispo: The patient is from: Home              Anticipated d/c is to: Home              Anticipated d/c date is: tomorrow.  Needs cardiology clearance  Consultants:Cardiology   Procedures:DCCV  Antimicrobials:  Anti-infectives (From admission, onward)    None       Subjective: Patient seen and examined at the bedside this afternoon.  He just came for DCCV.  Currently on normal sinus rhythm, mildly bradycardic but asymptomatic.  Very comfortable, lying on bed.  Eager to go home  Objective: Vitals:   04/16/21 1128 04/16/21 1130 04/16/21 1139 04/16/21 1142  BP: (!) 144/69 (!) 135/48 (!) 154/61 (!) 153/62  Pulse: (!) 47 (!) 46 (!) 47 (!) 46  Resp: 17 15 14 16   Temp:    97.6 F (36.4 C)  TempSrc:    Oral  SpO2: 100% 100% 97% 98%  Weight:      Height:        Intake/Output Summary (Last 24 hours) at 04/16/2021 1244 Last data filed at 04/16/2021 1118 Gross per 24 hour  Intake 100 ml  Output 1300 ml  Net -1200 ml   Filed Weights   04/13/21 2244  Weight: (!) 144 kg    Examination:  General exam: Overall comfortable, not in distress, morbidly obese HEENT: PERRL Respiratory system:  no wheezes or crackles  Cardiovascular system: S1 & S2 heard, RRR.  Gastrointestinal system: Abdomen is nondistended, soft and nontender. Central nervous system: Alert and oriented Extremities: No edema,  no clubbing ,no cyanosis Skin: No rashes, no ulcers,no icterus      Data Reviewed: I have personally reviewed following labs and imaging studies  CBC: Recent Labs  Lab 04/13/21 2253 04/15/21 0500 04/16/21 0416  WBC 3.7* 3.1* 3.2*  NEUTROABS 2.3 1.8  --   HGB 11.6* 10.8* 10.7*  HCT 36.6* 34.8* 34.7*  MCV 91.7 92.1 93.3  PLT 138* 105* 650*   Basic Metabolic Panel: Recent Labs  Lab 04/13/21 2253 04/14/21 0229 04/15/21 0500 04/16/21 0416  NA 141  --  142 143  K 4.0  --  3.5 3.4*  CL 105  --  105 107   CO2 26  --  27 29  GLUCOSE 124*  --  79 82  BUN 39*  --  38* 35*  CREATININE 2.98*  --  2.90* 3.05*  CALCIUM 9.0  --  8.5* 8.5*  MG  --  2.3 2.2 2.2  PHOS  --   --   --  3.8   GFR: Estimated Creatinine Clearance: 37.3 mL/min (A) (by C-G formula based on SCr of 3.05 mg/dL (H)). Liver Function Tests: Recent Labs  Lab 04/15/21 0500  AST 26  ALT 36  ALKPHOS 68  BILITOT 0.9  PROT 5.0*  ALBUMIN 3.0*   No results for input(s): LIPASE, AMYLASE in the last 168 hours. No results for input(s): AMMONIA in the last 168 hours. Coagulation Profile: Recent Labs  Lab 04/16/21 0823  INR 2.5*   Cardiac Enzymes: No results for input(s): CKTOTAL, CKMB, CKMBINDEX, TROPONINI in the last 168 hours. BNP (last 3 results) No results for input(s): PROBNP in the last 8760 hours. HbA1C: No results for input(s): HGBA1C in the last 72 hours. CBG: No results for input(s): GLUCAP in the last 168 hours. Lipid Profile: No results for input(s): CHOL, HDL, LDLCALC, TRIG, CHOLHDL, LDLDIRECT in the last 72 hours. Thyroid Function Tests: No results for input(s): TSH, T4TOTAL, FREET4, T3FREE, THYROIDAB in the last 72 hours. Anemia Panel: No results for input(s): VITAMINB12, FOLATE, FERRITIN, TIBC, IRON, RETICCTPCT in the last 72 hours. Sepsis Labs: No results for input(s): PROCALCITON, LATICACIDVEN in the last 168 hours.  Recent Results (from the past 240 hour(s))  Resp Panel by RT-PCR (Flu A&B, Covid) Nasopharyngeal Swab     Status: None   Collection Time: 04/14/21 12:27 AM   Specimen: Nasopharyngeal Swab; Nasopharyngeal(NP) swabs in vial transport medium  Result Value Ref Range Status   SARS Coronavirus 2 by RT PCR NEGATIVE NEGATIVE Final    Comment: (NOTE) SARS-CoV-2 target nucleic acids are NOT DETECTED.  The SARS-CoV-2 RNA is generally detectable in upper respiratory specimens during the acute phase of infection. The lowest concentration of SARS-CoV-2 viral copies this assay can detect is 138  copies/mL. A negative result does not preclude SARS-Cov-2 infection and should not be used as the sole basis for treatment or other patient management decisions. A negative result may occur with  improper specimen collection/handling, submission of specimen other than nasopharyngeal swab, presence of viral mutation(s) within the areas targeted by this assay, and inadequate number of viral copies(<138 copies/mL). A negative result must be combined with clinical observations, patient history, and epidemiological information. The expected result is Negative.  Fact Sheet for Patients:  EntrepreneurPulse.com.au  Fact Sheet for Healthcare Providers:  IncredibleEmployment.be  This test is no t yet approved or cleared by the [place] FDA and  has been authorized for detection and/or diagnosis of SARS-CoV-2 by FDA under an Emergency Use Authorization (EUA). This  EUA will remain  in effect (meaning this test can be used) for the duration of the COVID-19 declaration under Section 564(b)(1) of the Act, 21 U.S.C.section 360bbb-3(b)(1), unless the authorization is terminated  or revoked sooner.       Influenza A by PCR NEGATIVE NEGATIVE Final   Influenza B by PCR NEGATIVE NEGATIVE Final    Comment: (NOTE) The Xpert Xpress SARS-CoV-2/FLU/RSV plus assay is intended as an aid in the diagnosis of influenza from Nasopharyngeal swab specimens and should not be used as a sole basis for treatment. Nasal washings and aspirates are unacceptable for Xpert Xpress SARS-CoV-2/FLU/RSV testing.  Fact Sheet for Patients: EntrepreneurPulse.com.au  Fact Sheet for Healthcare Providers: IncredibleEmployment.be  This test is not yet approved or cleared by the [place] FDA and has been authorized for detection and/or diagnosis of SARS-CoV-2 by FDA under an Emergency Use Authorization (EUA). This EUA will remain in effect (meaning  this test can be used) for the duration of the COVID-19 declaration under Section 564(b)(1) of the Act, 21 U.S.C. section 360bbb-3(b)(1), unless the authorization is terminated or revoked.  Performed at [facility], [place] [address], [place], [place] [address]          Radiology Studies: ECHOCARDIOGRAM COMPLETE  Result Date: 04/15/2021    ECHOCARDIOGRAM REPORT   Patient Name:   OLUWATOBI VISSER Date of Exam: 04/15/2021 Medical Rec #:  [id number]  Height:       69.0 in Accession #:    [id number] Weight:       317.5 lb Date of Birth:  [birth year]   BSA:          2.514 m Patient Age:    6 years   BP:           171/95 mmHg Patient Gender: M          HR:           61 bpm. Exam Location:  Inpatient Procedure: 2D Echo, Cardiac Doppler, Color Doppler and Strain Analysis Indications:    ACUTE CHF SYSTOLIC  History:        Patient has prior history of Echocardiogram examinations, most                 recent 11/22/2020. CHF, COPD, Arrythmias:Atrial Fibrillation; Risk                 Factors:Hypertension. HYPERTENSIVE CARDIOVASCULAR DX/ PE.  Sonographer:    Beryle Beams Referring Phys: [place]  1. Left ventricular ejection fraction, by estimation, is 40 to 45%. The left ventricle has mildly decreased function. The left ventricle demonstrates global hypokinesis. The left ventricular internal cavity size was moderately dilated. Left ventricular diastolic parameters are indeterminate. The average left ventricular global longitudinal strain is -6.0 %. The global longitudinal strain is abnormal.  2. Right ventricular systolic function is normal. The right ventricular size is normal.  3. Shadowing in dome of LA cannot r/o calcified thrombus . Left atrial size was moderately dilated.  4. The mitral valve is abnormal. Mild mitral valve regurgitation. No evidence of mitral stenosis.  5. The aortic valve is tricuspid. There is mild calcification of the aortic valve. There is mild thickening of the aortic  valve. Aortic valve regurgitation is mild. Aortic valve sclerosis is present, with no evidence of aortic valve stenosis.  6. The inferior vena cava is normal in size with greater than 50% respiratory variability, suggesting right atrial pressure of 3 mmHg. FINDINGS  Left Ventricle: Left  ventricular ejection fraction, by estimation, is 40 to 45%. The left ventricle has mildly decreased function. The left ventricle demonstrates global hypokinesis. The average left ventricular global longitudinal strain is -6.0 %. The  global longitudinal strain is abnormal. The left ventricular internal cavity size was moderately dilated. There is no left ventricular hypertrophy. Left ventricular diastolic parameters are indeterminate. Right Ventricle: The right ventricular size is normal. No increase in right ventricular wall thickness. Right ventricular systolic function is normal. Left Atrium: Shadowing in dome of LA cannot r/o calcified thrombus. Left atrial size was moderately dilated. Right Atrium: Right atrial size was normal in size. Pericardium: There is no evidence of pericardial effusion. Mitral Valve: The mitral valve is abnormal. There is mild thickening of the mitral valve leaflet(s). There is mild calcification of the mitral valve leaflet(s). Mild mitral valve regurgitation. No evidence of mitral valve stenosis. MV peak gradient, 89.9  mmHg. The mean mitral valve gradient is 61.0 mmHg. Tricuspid Valve: The tricuspid valve is normal in structure. Tricuspid valve regurgitation is not demonstrated. No evidence of tricuspid stenosis. Aortic Valve: The aortic valve is tricuspid. There is mild calcification of the aortic valve. There is mild thickening of the aortic valve. Aortic valve regurgitation is mild. Aortic valve sclerosis is present, with no evidence of aortic valve stenosis. Aortic valve mean gradient measures 5.0 mmHg. Aortic valve peak gradient measures 10.2 mmHg. Aortic valve area, by VTI measures 3.00 cm.  Pulmonic Valve: The pulmonic valve was normal in structure. Pulmonic valve regurgitation is not visualized. No evidence of pulmonic stenosis. Aorta: The aortic root is normal in size and structure. Venous: The inferior vena cava is normal in size with greater than 50% respiratory variability, suggesting right atrial pressure of 3 mmHg. IAS/Shunts: No atrial level shunt detected by color flow Doppler.  LEFT VENTRICLE PLAX 2D LVIDd:         7.10 cm      Diastology LVIDs:         4.30 cm      LV e' medial:  7.83 cm/s LV PW:         1.20 cm      LV e' lateral: 7.29 cm/s LV IVS:        1.10 cm LVOT diam:     2.50 cm      2D Longitudinal Strain LV SV:         101          2D Strain GLS Avg:     -6.0 % LV SV Index:   40 LVOT Area:     4.91 cm  LV Volumes (MOD) LV vol d, MOD A2C: 224.0 ml LV vol d, MOD A4C: 278.0 ml LV vol s, MOD A2C: 143.0 ml LV vol s, MOD A4C: 159.0 ml LV SV MOD A2C:     81.0 ml LV SV MOD A4C:     278.0 ml LV SV MOD BP:      100.5 ml RIGHT VENTRICLE RV S prime:     11.30 cm/s RVOT diam:      3.10 cm TAPSE (M-mode): 1.9 cm LEFT ATRIUM              Index        RIGHT ATRIUM           Index LA diam:        4.50 cm  1.79 cm/m   RA Area:     23.10 cm LA Vol (A2C):   121.0 ml  48.13 ml/m  RA Volume:   72.40 ml  28.80 ml/m LA Vol (A4C):   144.0 ml 57.27 ml/m LA Biplane Vol: 138.0 ml 54.89 ml/m  AORTIC VALVE                     PULMONIC VALVE AV Area (Vmax):    2.83 cm      PV Vmax:       0.77 m/s AV Area (Vmean):   2.63 cm      PV Vmean:      44.000 cm/s AV Area (VTI):     3.00 cm      PV VTI:        0.134 m AV Vmax:           160.00 cm/s   PV Peak grad:  2.4 mmHg AV Vmean:          105.000 cm/s  PV Mean grad:  1.0 mmHg AV VTI:            0.337 m AV Peak Grad:      10.2 mmHg AV Mean Grad:      5.0 mmHg LVOT Vmax:         92.40 cm/s LVOT Vmean:        56.300 cm/s LVOT VTI:          0.206 m LVOT/AV VTI ratio: 0.61  AORTA Ao Root diam: 2.50 cm Ao Asc diam:  3.30 cm MITRAL VALVE MV Area VTI:  0.54 cm     SHUNTS MV Peak grad: 89.9 mmHg   Systemic VTI:  0.21 m MV Mean grad: 61.0 mmHg   Systemic Diam: 2.50 cm MV Vmax:      4.74 m/s    Pulmonic Diam: 3.10 cm MV Vmean:     373.0 cm/s Jenkins Rouge MD Electronically signed by Jenkins Rouge MD Signature Date/Time: 04/15/2021/2:40:53 PM    Final         Scheduled Meds:  amiodarone  200 mg Oral Daily   carvedilol  12.5 mg Oral BID WC   hydrALAZINE  50 mg Oral Q8H   [START ON 04/17/2021] isosorbide mononitrate  60 mg Oral Daily   potassium chloride  40 mEq Oral Once   rivaroxaban  20 mg Oral Q supper   Continuous Infusions:   LOS: 1 day        Shelly Coss, MD [facility] P1/24/2023, 12:44 PM

## 2021-04-16 NOTE — Progress Notes (Signed)
Progress Note  Patient Name: Phillip [name] Date of Encounter: 04/16/2021  [facility] Cardiologist: Glori Bickers, MD   Subjective   Feeling much better.   Inpatient Medications    Scheduled Meds:  [MAR Hold] amiodarone  200 mg Oral Daily   [MAR Hold] carvedilol  25 mg Oral BID WC   [MAR Hold] furosemide  40 mg Intravenous Q12H   [MAR Hold] isosorbide mononitrate  120 mg Oral Daily   [MAR Hold] rivaroxaban  20 mg Oral Q supper   Continuous Infusions:  sodium chloride     PRN Meds: [MAR Hold] acetaminophen **OR** [MAR Hold] acetaminophen, [MAR Hold] ondansetron **OR** [MAR Hold] ondansetron (ZOFRAN) IV   Vital Signs    Vitals:   04/16/21 1128 04/16/21 1130 04/16/21 1139 04/16/21 1142  BP: (!) 144/69 (!) 135/48 (!) 154/61 (!) 153/62  Pulse: (!) 47 (!) 46 (!) 47 (!) 46  Resp: 17 15 14 16   Temp:      TempSrc:      SpO2: 100% 100% 97% 99%  Weight:      Height:        Intake/Output Summary (Last 24 hours) at 04/16/2021 1157 Last data filed at 04/16/2021 1118 Gross per 24 hour  Intake 100 ml  Output 1300 ml  Net -1200 ml   Last 3 Weights 04/13/2021 03/28/2021 03/06/2021  Weight (lbs) 317 lb 7.4 oz 319 lb 320 lb  Weight (kg) 144 kg 144.697 kg 145.151 kg      Telemetry    Atrial flutter-->sinus bradycardia.  - Personally Reviewed  ECG    N/a - Personally Reviewed  Physical Exam   GEN: No acute distress.   Neck: No JVD Cardiac: RRR.  Braycardic.  No murmurs, rubs, or gallops.  Respiratory: Clear to auscultation bilaterally. GI: Soft, nontender, non-distended  MS: No edema; No deformity. Neuro:  Nonfocal  Psych: Normal affect   Labs    High Sensitivity Troponin:   Recent Labs  Lab 04/08/21 1345 04/08/21 1610 04/13/21 2253 04/14/21 0110  TROPONINIHS 37* 41* 42* 37*     Chemistry Recent Labs  Lab 04/13/21 2253 04/14/21 0229 04/15/21 0500 04/16/21 0416  NA 141  --  142 143  K 4.0  --  3.5 3.4*  CL 105  --  105 107  CO2 26  --  27 29   GLUCOSE 124*  --  79 82  BUN 39*  --  38* 35*  CREATININE 2.98*  --  2.90* 3.05*  CALCIUM 9.0  --  8.5* 8.5*  MG  --  2.3 2.2 2.2  PROT  --   --  5.0*  --   ALBUMIN  --   --  3.0*  --   AST  --   --  26  --   ALT  --   --  36  --   ALKPHOS  --   --  68  --   BILITOT  --   --  0.9  --   GFRNONAA 24*  --  24* 23*  ANIONGAP 10  --  10 7    Lipids No results for input(s): CHOL, TRIG, HDL, LABVLDL, LDLCALC, CHOLHDL in the last 168 hours.  Hematology Recent Labs  Lab 04/13/21 2253 04/15/21 0500 04/16/21 0416  WBC 3.7* 3.1* 3.2*  RBC 3.99* 3.78* 3.72*  HGB 11.6* 10.8* 10.7*  HCT 36.6* 34.8* 34.7*  MCV 91.7 92.1 93.3  MCH 29.1 28.6 28.8  MCHC 31.7 31.0 30.8  RDW 15.8*  15.9* 15.9*  PLT 138* 105* 111*   Thyroid No results for input(s): TSH, FREET4 in the last 168 hours.  BNP Recent Labs  Lab 04/13/21 2253  BNP 1,550.0*    DDimer No results for input(s): DDIMER in the last 168 hours.   Radiology    ECHOCARDIOGRAM COMPLETE  Result Date: 04/15/2021    ECHOCARDIOGRAM REPORT   Patient Name:   RAJVIR ERNSTER Date of Exam: 04/15/2021 Medical Rec #:  [id number]  Height:       69.0 in Accession #:    [id number] Weight:       317.5 lb Date of Birth:  02-12-1961   BSA:          2.514 m Patient Age:    61 years   BP:           171/95 mmHg Patient Gender: M          HR:           61 bpm. Exam Location:  Inpatient Procedure: 2D Echo, Cardiac Doppler, Color Doppler and Strain Analysis Indications:    ACUTE CHF SYSTOLIC  History:        Patient has prior history of Echocardiogram examinations, most                 recent 11/22/2020. CHF, COPD, Arrythmias:Atrial Fibrillation; Risk                 Factors:Hypertension. HYPERTENSIVE CARDIOVASCULAR DX/ PE.  Sonographer:    Beryle Beams Referring Phys: [place]  1. Left ventricular ejection fraction, by estimation, is 40 to 45%. The left ventricle has mildly decreased function. The left ventricle demonstrates global hypokinesis. The left  ventricular internal cavity size was moderately dilated. Left ventricular diastolic parameters are indeterminate. The average left ventricular global longitudinal strain is -6.0 %. The global longitudinal strain is abnormal.  2. Right ventricular systolic function is normal. The right ventricular size is normal.  3. Shadowing in dome of LA cannot r/o calcified thrombus . Left atrial size was moderately dilated.  4. The mitral valve is abnormal. Mild mitral valve regurgitation. No evidence of mitral stenosis.  5. The aortic valve is tricuspid. There is mild calcification of the aortic valve. There is mild thickening of the aortic valve. Aortic valve regurgitation is mild. Aortic valve sclerosis is present, with no evidence of aortic valve stenosis.  6. The inferior vena cava is normal in size with greater than 50% respiratory variability, suggesting right atrial pressure of 3 mmHg. FINDINGS  Left Ventricle: Left ventricular ejection fraction, by estimation, is 40 to 45%. The left ventricle has mildly decreased function. The left ventricle demonstrates global hypokinesis. The average left ventricular global longitudinal strain is -6.0 %. The  global longitudinal strain is abnormal. The left ventricular internal cavity size was moderately dilated. There is no left ventricular hypertrophy. Left ventricular diastolic parameters are indeterminate. Right Ventricle: The right ventricular size is normal. No increase in right ventricular wall thickness. Right ventricular systolic function is normal. Left Atrium: Shadowing in dome of LA cannot r/o calcified thrombus. Left atrial size was moderately dilated. Right Atrium: Right atrial size was normal in size. Pericardium: There is no evidence of pericardial effusion. Mitral Valve: The mitral valve is abnormal. There is mild thickening of the mitral valve leaflet(s). There is mild calcification of the mitral valve leaflet(s). Mild mitral valve regurgitation. No evidence of  mitral valve stenosis. MV peak gradient, 89.9  mmHg. The mean mitral valve  gradient is 61.0 mmHg. Tricuspid Valve: The tricuspid valve is normal in structure. Tricuspid valve regurgitation is not demonstrated. No evidence of tricuspid stenosis. Aortic Valve: The aortic valve is tricuspid. There is mild calcification of the aortic valve. There is mild thickening of the aortic valve. Aortic valve regurgitation is mild. Aortic valve sclerosis is present, with no evidence of aortic valve stenosis. Aortic valve mean gradient measures 5.0 mmHg. Aortic valve peak gradient measures 10.2 mmHg. Aortic valve area, by VTI measures 3.00 cm. Pulmonic Valve: The pulmonic valve was normal in structure. Pulmonic valve regurgitation is not visualized. No evidence of pulmonic stenosis. Aorta: The aortic root is normal in size and structure. Venous: The inferior vena cava is normal in size with greater than 50% respiratory variability, suggesting right atrial pressure of 3 mmHg. IAS/Shunts: No atrial level shunt detected by color flow Doppler.  LEFT VENTRICLE PLAX 2D LVIDd:         7.10 cm      Diastology LVIDs:         4.30 cm      LV e' medial:  7.83 cm/s LV PW:         1.20 cm      LV e' lateral: 7.29 cm/s LV IVS:        1.10 cm LVOT diam:     2.50 cm      2D Longitudinal Strain LV SV:         101          2D Strain GLS Avg:     -6.0 % LV SV Index:   40 LVOT Area:     4.91 cm  LV Volumes (MOD) LV vol d, MOD A2C: 224.0 ml LV vol d, MOD A4C: 278.0 ml LV vol s, MOD A2C: 143.0 ml LV vol s, MOD A4C: 159.0 ml LV SV MOD A2C:     81.0 ml LV SV MOD A4C:     278.0 ml LV SV MOD BP:      100.5 ml RIGHT VENTRICLE RV S prime:     11.30 cm/s RVOT diam:      3.10 cm TAPSE (M-mode): 1.9 cm LEFT ATRIUM              Index        RIGHT ATRIUM           Index LA diam:        4.50 cm  1.79 cm/m   RA Area:     23.10 cm LA Vol (A2C):   121.0 ml 48.13 ml/m  RA Volume:   72.40 ml  28.80 ml/m LA Vol (A4C):   144.0 ml 57.27 ml/m LA Biplane Vol: 138.0 ml  54.89 ml/m  AORTIC VALVE                     PULMONIC VALVE AV Area (Vmax):    2.83 cm      PV Vmax:       0.77 m/s AV Area (Vmean):   2.63 cm      PV Vmean:      44.000 cm/s AV Area (VTI):     3.00 cm      PV VTI:        0.134 m AV Vmax:           160.00 cm/s   PV Peak grad:  2.4 mmHg AV Vmean:          105.000 cm/s  PV Mean  grad:  1.0 mmHg AV VTI:            0.337 m AV Peak Grad:      10.2 mmHg AV Mean Grad:      5.0 mmHg LVOT Vmax:         92.40 cm/s LVOT Vmean:        56.300 cm/s LVOT VTI:          0.206 m LVOT/AV VTI ratio: 0.61  AORTA Ao Root diam: 2.50 cm Ao Asc diam:  3.30 cm MITRAL VALVE MV Area VTI:  0.54 cm    SHUNTS MV Peak grad: 89.9 mmHg   Systemic VTI:  0.21 m MV Mean grad: 61.0 mmHg   Systemic Diam: 2.50 cm MV Vmax:      4.74 m/s    Pulmonic Diam: 3.10 cm MV Vmean:     373.0 cm/s Jenkins Rouge MD Electronically signed by Jenkins Rouge MD Signature Date/Time: 04/15/2021/2:40:53 PM    Final     Cardiac Studies   Echo 04/15/21:  1. Left ventricular ejection fraction, by estimation, is 40 to 45%. The  left ventricle has mildly decreased function. The left ventricle  demonstrates global hypokinesis. The left ventricular internal cavity size  was moderately dilated. Left ventricular  diastolic parameters are indeterminate. The average left ventricular  global longitudinal strain is -6.0 %. The global longitudinal strain is  abnormal.   2. Right ventricular systolic function is normal. The right ventricular  size is normal.   3. Shadowing in dome of LA cannot r/o calcified thrombus . Left atrial  size was moderately dilated.   4. The mitral valve is abnormal. Mild mitral valve regurgitation. No  evidence of mitral stenosis.   5. The aortic valve is tricuspid. There is mild calcification of the  aortic valve. There is mild thickening of the aortic valve. Aortic valve  regurgitation is mild. Aortic valve sclerosis is present, with no evidence  of aortic valve stenosis.   6. The inferior  vena cava is normal in size with greater than 50%  respiratory variability, suggesting right atrial pressure of 3 mmHg.   Patient Profile     Mr. 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  His volume status is much better and he is looking euvolemic.  He was net -1.3 L yesterday and -5 L this admission.  His kidney function is slightly worse today.  We will stop the IV Lasix.  Resume oral diuretics tomorrow if renal function starts improving.  Continue carvedilol.  Blood pressure is poorly controlled.  His home hydralazine and Imdur have been held.  We will resume 50 mg 3 times daily and 60 mg of Imdur.  He just underwent successful cardioversion and is now bradycardic in the 40s.  Reduce carvedilol to 12.5 mg.  #Atrial flutter: Underwent cardioversion this morning.  Now in sinus bradycardia.  Reducing carvedilol as above.  Continue Eliquis.  #CKD 4: Mild AKI.  Holding diuresis as above.       For questions or updates, please contact [place] Please consult [email or web address] for contact info under        Signed, Skeet Latch, MD  04/16/2021, 11:57 AM

## 2021-04-16 NOTE — CV Procedure (Signed)
° °  Electrical Cardioversion Procedure Note Phillip [name] [id number] Aug 11, 1960  Procedure: Electrical Cardioversion Indications:  Atrial Flutter  Time Out: Verified patient identification, verified procedure,medications/allergies/relevent history reviewed, required imaging and test results available.  Performed  Procedure Details  The patient signed informed consent.   The patient was NPO past midnight. Has had therapeutic anticoagulation with Xarelto greater than 3 weeks. The patient denies any interruption of anticoagulation.  Anesthesia was administered by the anesthesia team.  Adequate airway was maintained throughout and vital followed per protocol.  He was cardioverted x 2 with 150 J and then 200 J of biphasic synchronized energy.  He converted to NSR after the second.  There were no apparent complications.  The patient tolerated the procedure well and had normal neuro status and respiratory status post procedure with vitals stable as recorded elsewhere.     IMPRESSION:  Successful cardioversion of atrial fibrillation to sinus bradycardia.   Follow up:  Patient will be transferred to his room on the medical floor.  The patient advised to continue anticoagulation.   Phillip [name] 04/16/2021, 11:28 AM

## 2021-04-16 NOTE — Transfer of Care (Signed)
Immediate Anesthesia Transfer of Care Note  Patient: Phillip [name]  Procedure(s) Performed: CARDIOVERSION  Patient Location: Endoscopy Unit  Anesthesia Type:General  Level of Consciousness: awake, patient cooperative and responds to stimulation  Airway & Oxygen Therapy: Patient Spontanous Breathing and Patient connected to nasal cannula oxygen  Post-op Assessment: Report given to RN and Post -op Vital signs reviewed and stable  Post vital signs: Reviewed and stable  Last Vitals:  Vitals Value Taken Time  BP    Temp    Pulse    Resp    SpO2      Last Pain:  Vitals:   04/16/21 1014  TempSrc:   PainSc: 0-No pain      Patients Stated Pain Goal: 0 (15/97/33 1250)  Complications: No notable events documented.

## 2021-04-16 NOTE — Anesthesia Preprocedure Evaluation (Addendum)
Anesthesia Evaluation  Patient identified by MRN, date of birth, ID band Patient awake    Reviewed: Allergy & Precautions, NPO status , Patient's Chart, lab work & pertinent test results  Airway Mallampati: III  TM Distance: >3 FB Neck ROM: Full    Dental no notable dental hx.    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 on medications and Pt. on home beta blockers +CHF and + DVT  + dysrhythmias (on Xarelto) Atrial Fibrillation  Rhythm:Irregular Rate:Normal     Neuro/Psych negative neurological ROS  negative psych ROS   GI/Hepatic negative GI ROS,   Endo/Other  Morbid obesity  Renal/GU CRFRenal disease  negative genitourinary   Musculoskeletal negative musculoskeletal ROS (+)   Abdominal Normal abdominal exam  (+)   Peds  Hematology negative hematology ROS (+)   Anesthesia Other Findings   Reproductive/Obstetrics                            Anesthesia Physical Anesthesia Plan  ASA: 3  Anesthesia Plan: General   Post-op Pain Management:    Induction: Intravenous  PONV Risk Score and Plan: 2 and Propofol infusion and Treatment may vary due to age or medical condition  Airway Management Planned: Mask  Additional Equipment: None  Intra-op Plan:   Post-operative Plan:   Informed Consent: I have reviewed the patients History and Physical, chart, labs and discussed the procedure including the risks, benefits and alternatives for the proposed anesthesia with the patient or authorized representative who has indicated his/her understanding and acceptance.     Dental advisory given  Plan Discussed with: CRNA  Anesthesia Plan Comments: (Lab Results      Component                Value                Date                      WBC                      3.2 (L)             04/16/2021                HGB                      10.7 (L)            04/16/2021                HCT                      34.7 (L)            04/16/2021                MCV                      93.3                04/16/2021                PLT  111 (L)             04/16/2021           Lab Results      Component                Value               Date                      NA                       143                 04/16/2021                K                        3.4 (L)             04/16/2021                CO2                      29                  04/16/2021                GLUCOSE                  82                  04/16/2021                BUN                      35 (H)              04/16/2021                CREATININE               3.05 (H)            04/16/2021                CALCIUM                  8.5 (L)             04/16/2021                GFRNONAA                 23 (L)              04/16/2021            ECHO 04/15/21: 1. Left ventricular ejection fraction, by estimation, is 40 to 45%. The  left ventricle has mildly decreased function. The left ventricle  demonstrates global hypokinesis. The left ventricular internal cavity size  was moderately dilated. Left ventricular  diastolic parameters are indeterminate. The average left ventricular  global longitudinal strain is -6.0 %. The global longitudinal strain is  abnormal.  2. Right ventricular systolic function is normal. The right ventricular  size is normal.  3. Shadowing in dome of LA cannot r/o calcified thrombus . Left atrial  size was moderately dilated.  4. The mitral valve is abnormal. Mild mitral valve regurgitation.  No  evidence of mitral stenosis.  5. The aortic valve is tricuspid. There is mild calcification of the  aortic valve. There is mild thickening of the aortic valve. Aortic valve  regurgitation is  mild. Aortic valve sclerosis is present, with no evidence  of aortic valve stenosis.  6. The inferior vena cava is normal in size with greater than 50%  respiratory variability, suggesting right atrial pressure of 3 mmHg. )      Anesthesia Quick Evaluation

## 2021-04-17 ENCOUNTER — Inpatient Hospital Stay (HOSPITAL_COMMUNITY): Payer: Managed Care, Other (non HMO)

## 2021-04-17 ENCOUNTER — Encounter (HOSPITAL_COMMUNITY): Payer: Self-pay | Admitting: Cardiology

## 2021-04-17 DIAGNOSIS — G4733 Obstructive sleep apnea (adult) (pediatric): Secondary | ICD-10-CM | POA: Diagnosis not present

## 2021-04-17 DIAGNOSIS — I5023 Acute on chronic systolic (congestive) heart failure: Secondary | ICD-10-CM | POA: Diagnosis not present

## 2021-04-17 DIAGNOSIS — I10 Essential (primary) hypertension: Secondary | ICD-10-CM | POA: Diagnosis not present

## 2021-04-17 LAB — BASIC METABOLIC PANEL
Anion gap: 8 (ref 5–15)
Anion gap: 5 (ref 5–15)
BUN: 39 mg/dL — ABNORMAL HIGH (ref 6–20)
BUN: 38 mg/dL — ABNORMAL HIGH (ref 6–20)
CO2: 27 mmol/L (ref 22–32)
CO2: 31 mmol/L (ref 22–32)
Calcium: 8.6 mg/dL — ABNORMAL LOW (ref 8.9–10.3)
Calcium: 8.7 mg/dL — ABNORMAL LOW (ref 8.9–10.3)
Chloride: 103 mmol/L (ref 98–111)
Chloride: 103 mmol/L (ref 98–111)
Creatinine, Ser: 3.44 mg/dL — ABNORMAL HIGH (ref 0.61–1.24)
Creatinine, Ser: 3.27 mg/dL — ABNORMAL HIGH (ref 0.61–1.24)
GFR, Estimated: 20 mL/min — ABNORMAL LOW (ref >60)
GFR, Estimated: 21 mL/min — ABNORMAL LOW (ref >60)
Glucose, Bld: 79 mg/dL (ref 70–99)
Glucose, Bld: 86 mg/dL (ref 70–99)
Potassium: 4 mmol/L (ref 3.5–5.1)
Potassium: 3.8 mmol/L (ref 3.5–5.1)
Sodium: 138 mmol/L (ref 135–145)
Sodium: 139 mmol/L (ref 135–145)

## 2021-04-17 LAB — BRAIN NATRIURETIC PEPTIDE: B Natriuretic Peptide: 665.4 pg/mL — ABNORMAL HIGH (ref 0.0–100.0)

## 2021-04-17 MED ORDER — ISOSORBIDE MONONITRATE ER 60 MG PO TB24
120.0000 mg | ORAL_TABLET | Freq: Every day | ORAL | Status: DC
  Administered 2021-04-18: 120 mg via ORAL
  Filled 2021-04-17: qty 2

## 2021-04-17 MED ORDER — MOMETASONE FURO-FORMOTEROL FUM 100-5 MCG/ACT IN AERO
2.0000 | INHALATION_SPRAY | Freq: Two times a day (BID) | RESPIRATORY_TRACT | Status: DC
  Administered 2021-04-17 – 2021-04-18 (×2): 2 via RESPIRATORY_TRACT
  Filled 2021-04-17: qty 8.8

## 2021-04-17 MED ORDER — HYDRALAZINE HCL 50 MG PO TABS
50.0000 mg | ORAL_TABLET | Freq: Once | ORAL | Status: AC
  Administered 2021-04-17: 12:00:00 50 mg via ORAL
  Filled 2021-04-17: qty 1

## 2021-04-17 MED ORDER — ISOSORBIDE MONONITRATE ER 60 MG PO TB24
60.0000 mg | ORAL_TABLET | Freq: Once | ORAL | Status: AC
  Administered 2021-04-17: 12:00:00 60 mg via ORAL
  Filled 2021-04-17: qty 1

## 2021-04-17 MED ORDER — HYDRALAZINE HCL 50 MG PO TABS
100.0000 mg | ORAL_TABLET | Freq: Three times a day (TID) | ORAL | Status: DC
  Administered 2021-04-17 – 2021-04-18 (×3): 100 mg via ORAL
  Filled 2021-04-17 (×3): qty 2

## 2021-04-17 NOTE — Progress Notes (Signed)
PROGRESS NOTE    Phillip [name]  XMI:[id number] DOB: 03/30/1960 DOA: 04/13/2021 PCP: [name], MD   Chief Complain: Shortness of breath, palpitations  Brief Narrative: Patient is a 61 year old male with history of paroxysmal A. fib on Xarelto, chronic systolic  congestive heart failure COPD, obesity, OSA on CPAP who presented with shortness of breath, palpitations.  He follows with advanced heart failure clinic.  On presentation, chest x-ray showed cardiomegaly with vascular congestion.  BNP was elevated.  EKG showed A. fib with RVR.Patient was admitted for the management of acute on chronic systolic congestive heart failure, A. fib with RVR.  Cardiology consulted.  Underwent DCCV  on 1/24. Hospital course remarkable for worsening kidney function Assessment & Plan:   Principal Problem:   Acute on chronic systolic CHF (congestive heart failure) (HCC) Active Problems:   OSA on CPAP   Chronic kidney disease, stage IV (severe) (HCC)   Essential hypertension   Morbid obesity (HCC)   Paroxysmal atrial fibrillation (HCC)   Anticoagulated  Acute on chronic systolic congestive heart failure: Echo showed EF of 40 to 45%, same as before.  Patient with shortness of breath.  Elevated BNP.  Looked volume overloaded on presentation.  Chest x-ray showed vascular congestion.  Initially started on IV Lasix.  Currently on hold because of worsening kidney function. Continue carvedilol.  Oral diuretics not started yet, creatinine trended up.  Check BMP tomorrow.  He might have been over diuresed. Continue nitrates  Paroxysmal A. fib/A flutter: Underwent cardioversion on 1/24.  Now in sinus bradycardia.  On carvedilol, amiodarone amiodarone.  On Xarelto for anticoagulation  CKD stage IV: Baseline creatinine around 2.9.  Creatinine again slightly trended up today.  Avoid nephrotoxins.  Diuretics on hold.  Check BMP tomorrow  Pancytopenia/Normocytic anemia: Anemia associated  with chronic disease/CKD.   Currently hemoglobin stable in the range of 10.  Morbid obesity: BMI 46.8          DVT prophylaxis:Eliquis Code Status: Full Family Communication: None at bedside Patient status:Inpatient  Dispo: The patient is from: Home              Anticipated d/c is to: Home              Anticipated d/c date MG:NOIBBC  tomorrow.  Awaiting improvement in the kidney function.  Needs cardiology clearance  Consultants:Cardiology   Procedures:DCCV  Antimicrobials:  Anti-infectives (From admission, onward)    None       Subjective: Patient seen and examined at the bedside this morning.  Hemodynamically stable.  Comfortable.  On normal sinus rhythm.  Eager to go home  Objective: Vitals:   04/16/21 2100 04/17/21 0040 04/17/21 0500 04/17/21 0946  BP:  (!) 146/74 (!) 130/57 (!) 150/78  Pulse: (!) 50  (!) 52 (!) 58  Resp: 18 20 18 18   Temp:  98 F (36.7 C) 97.6 F (36.4 C) 98.4 F (36.9 C)  TempSrc:  Oral Oral Oral  SpO2:  98% 98% 98%  Weight:   (!) 142.2 kg   Height:        Intake/Output Summary (Last 24 hours) at 04/17/2021 1156 Last data filed at 04/17/2021 0900 Gross per 24 hour  Intake 240 ml  Output 600 ml  Net -360 ml   Filed Weights   04/13/21 2244 04/17/21 0500  Weight: (!) 144 kg (!) 142.2 kg    Examination:  General exam: Overall comfortable, not in distress, morbidly obese HEENT: PERRL Respiratory system:  no wheezes or  crackles  Cardiovascular system: S1 & S2 heard, RRR.  Gastrointestinal system: Abdomen is nondistended, soft and nontender. Central nervous system: Alert and oriented Extremities: No edema, no clubbing ,no cyanosis Skin: No rashes, no ulcers,no icterus       Data Reviewed: I have personally reviewed following labs and imaging studies  CBC: Recent Labs  Lab 04/13/21 2253 04/15/21 0500 04/16/21 0416  WBC 3.7* 3.1* 3.2*  NEUTROABS 2.3 1.8  --   HGB 11.6* 10.8* 10.7*  HCT 36.6* 34.8* 34.7*  MCV 91.7 92.1 93.3  PLT 138* 105* 111*    Basic Metabolic Panel: Recent Labs  Lab 04/13/21 2253 04/14/21 0229 04/15/21 0500 04/16/21 0416 04/17/21 0243  NA 141  --  142 143 138  K 4.0  --  3.5 3.4* 4.0  CL 105  --  105 107 103  CO2 26  --  27 29 27   GLUCOSE 124*  --  79 82 79  BUN 39*  --  38* 35* 39*  CREATININE 2.98*  --  2.90* 3.05* 3.44*  CALCIUM 9.0  --  8.5* 8.5* 8.6*  MG  --  2.3 2.2 2.2  --   PHOS  --   --   --  3.8  --    GFR: Estimated Creatinine Clearance: 32.9 mL/min (A) (by C-G formula based on SCr of 3.44 mg/dL (H)). Liver Function Tests: Recent Labs  Lab 04/15/21 0500  AST 26  ALT 36  ALKPHOS 68  BILITOT 0.9  PROT 5.0*  ALBUMIN 3.0*   No results for input(s): LIPASE, AMYLASE in the last 168 hours. No results for input(s): AMMONIA in the last 168 hours. Coagulation Profile: Recent Labs  Lab 04/16/21 0823  INR 2.5*   Cardiac Enzymes: No results for input(s): CKTOTAL, CKMB, CKMBINDEX, TROPONINI in the last 168 hours. BNP (last 3 results) No results for input(s): PROBNP in the last 8760 hours. HbA1C: No results for input(s): HGBA1C in the last 72 hours. CBG: No results for input(s): GLUCAP in the last 168 hours. Lipid Profile: No results for input(s): CHOL, HDL, LDLCALC, TRIG, CHOLHDL, LDLDIRECT in the last 72 hours. Thyroid Function Tests: No results for input(s): TSH, T4TOTAL, FREET4, T3FREE, THYROIDAB in the last 72 hours. Anemia Panel: No results for input(s): VITAMINB12, FOLATE, FERRITIN, TIBC, IRON, RETICCTPCT in the last 72 hours. Sepsis Labs: No results for input(s): PROCALCITON, LATICACIDVEN in the last 168 hours.  Recent Results (from the past 240 hour(s))  Resp Panel by RT-PCR (Flu A&B, Covid) Nasopharyngeal Swab     Status: None   Collection Time: 04/14/21 12:27 AM   Specimen: Nasopharyngeal Swab; Nasopharyngeal(NP) swabs in vial transport medium  Result Value Ref Range Status   SARS Coronavirus 2 by RT PCR NEGATIVE NEGATIVE Final    Comment: (NOTE) SARS-CoV-2 target  nucleic acids are NOT DETECTED.  The SARS-CoV-2 RNA is generally detectable in upper respiratory specimens during the acute phase of infection. The lowest concentration of SARS-CoV-2 viral copies this assay can detect is 138 copies/mL. A negative result does not preclude SARS-Cov-2 infection and should not be used as the sole basis for treatment or other patient management decisions. A negative result may occur with  improper specimen collection/handling, submission of specimen other than nasopharyngeal swab, presence of viral mutation(s) within the areas targeted by this assay, and inadequate number of viral copies(<138 copies/mL). A negative result must be combined with clinical observations, patient history, and epidemiological information. The expected result is Negative.  Fact Sheet for Patients:  EntrepreneurPulse.com.au  Fact Sheet for Healthcare Providers:  IncredibleEmployment.be  This test is no t yet approved or cleared by the [place] FDA and  has been authorized for detection and/or diagnosis of SARS-CoV-2 by FDA under an Emergency Use Authorization (EUA). This EUA will remain  in effect (meaning this test can be used) for the duration of the COVID-19 declaration under Section 564(b)(1) of the Act, 21 U.S.C.section 360bbb-3(b)(1), unless the authorization is terminated  or revoked sooner.       Influenza A by PCR NEGATIVE NEGATIVE Final   Influenza B by PCR NEGATIVE NEGATIVE Final    Comment: (NOTE) The Xpert Xpress SARS-CoV-2/FLU/RSV plus assay is intended as an aid in the diagnosis of influenza from Nasopharyngeal swab specimens and should not be used as a sole basis for treatment. Nasal washings and aspirates are unacceptable for Xpert Xpress SARS-CoV-2/FLU/RSV testing.  Fact Sheet for Patients: EntrepreneurPulse.com.au  Fact Sheet for Healthcare  Providers: IncredibleEmployment.be  This test is not yet approved or cleared by the [place] FDA and has been authorized for detection and/or diagnosis of SARS-CoV-2 by FDA under an Emergency Use Authorization (EUA). This EUA will remain in effect (meaning this test can be used) for the duration of the COVID-19 declaration under Section 564(b)(1) of the Act, 21 U.S.C. section 360bbb-3(b)(1), unless the authorization is terminated or revoked.  Performed at [facility], [place] [address], [place], [place] [address]          Radiology Studies: ECHOCARDIOGRAM COMPLETE  Result Date: 04/15/2021    ECHOCARDIOGRAM REPORT   Patient Name:   Phillip [name] Date of Exam: 04/15/2021 Medical Rec #:  [id number]  Height:       69.0 in Accession #:    [id number] Weight:       317.5 lb Date of Birth:  June 17, 1960   BSA:          2.514 m Patient Age:    63 years   BP:           171/95 mmHg Patient Gender: M          HR:           61 bpm. Exam Location:  Inpatient Procedure: 2D Echo, Cardiac Doppler, Color Doppler and Strain Analysis Indications:    ACUTE CHF SYSTOLIC  History:        Patient has prior history of Echocardiogram examinations, most                 recent 11/22/2020. CHF, COPD, Arrythmias:Atrial Fibrillation; Risk                 Factors:Hypertension. HYPERTENSIVE CARDIOVASCULAR DX/ PE.  Sonographer:    Beryle Beams Referring Phys: [place]  1. Left ventricular ejection fraction, by estimation, is 40 to 45%. The left ventricle has mildly decreased function. The left ventricle demonstrates global hypokinesis. The left ventricular internal cavity size was moderately dilated. Left ventricular diastolic parameters are indeterminate. The average left ventricular global longitudinal strain is -6.0 %. The global longitudinal strain is abnormal.  2. Right ventricular systolic function is normal. The right ventricular size is normal.  3. Shadowing in dome of LA cannot r/o  calcified thrombus . Left atrial size was moderately dilated.  4. The mitral valve is abnormal. Mild mitral valve regurgitation. No evidence of mitral stenosis.  5. The aortic valve is tricuspid. There is mild calcification of the aortic valve. There is mild thickening of the aortic valve. Aortic  valve regurgitation is mild. Aortic valve sclerosis is present, with no evidence of aortic valve stenosis.  6. The inferior vena cava is normal in size with greater than 50% respiratory variability, suggesting right atrial pressure of 3 mmHg. FINDINGS  Left Ventricle: Left ventricular ejection fraction, by estimation, is 40 to 45%. The left ventricle has mildly decreased function. The left ventricle demonstrates global hypokinesis. The average left ventricular global longitudinal strain is -6.0 %. The  global longitudinal strain is abnormal. The left ventricular internal cavity size was moderately dilated. There is no left ventricular hypertrophy. Left ventricular diastolic parameters are indeterminate. Right Ventricle: The right ventricular size is normal. No increase in right ventricular wall thickness. Right ventricular systolic function is normal. Left Atrium: Shadowing in dome of LA cannot r/o calcified thrombus. Left atrial size was moderately dilated. Right Atrium: Right atrial size was normal in size. Pericardium: There is no evidence of pericardial effusion. Mitral Valve: The mitral valve is abnormal. There is mild thickening of the mitral valve leaflet(s). There is mild calcification of the mitral valve leaflet(s). Mild mitral valve regurgitation. No evidence of mitral valve stenosis. MV peak gradient, 89.9  mmHg. The mean mitral valve gradient is 61.0 mmHg. Tricuspid Valve: The tricuspid valve is normal in structure. Tricuspid valve regurgitation is not demonstrated. No evidence of tricuspid stenosis. Aortic Valve: The aortic valve is tricuspid. There is mild calcification of the aortic valve. There is mild  thickening of the aortic valve. Aortic valve regurgitation is mild. Aortic valve sclerosis is present, with no evidence of aortic valve stenosis. Aortic valve mean gradient measures 5.0 mmHg. Aortic valve peak gradient measures 10.2 mmHg. Aortic valve area, by VTI measures 3.00 cm. Pulmonic Valve: The pulmonic valve was normal in structure. Pulmonic valve regurgitation is not visualized. No evidence of pulmonic stenosis. Aorta: The aortic root is normal in size and structure. Venous: The inferior vena cava is normal in size with greater than 50% respiratory variability, suggesting right atrial pressure of 3 mmHg. IAS/Shunts: No atrial level shunt detected by color flow Doppler.  LEFT VENTRICLE PLAX 2D LVIDd:         7.10 cm      Diastology LVIDs:         4.30 cm      LV e' medial:  7.83 cm/s LV PW:         1.20 cm      LV e' lateral: 7.29 cm/s LV IVS:        1.10 cm LVOT diam:     2.50 cm      2D Longitudinal Strain LV SV:         101          2D Strain GLS Avg:     -6.0 % LV SV Index:   40 LVOT Area:     4.91 cm  LV Volumes (MOD) LV vol d, MOD A2C: 224.0 ml LV vol d, MOD A4C: 278.0 ml LV vol s, MOD A2C: 143.0 ml LV vol s, MOD A4C: 159.0 ml LV SV MOD A2C:     81.0 ml LV SV MOD A4C:     278.0 ml LV SV MOD BP:      100.5 ml RIGHT VENTRICLE RV S prime:     11.30 cm/s RVOT diam:      3.10 cm TAPSE (M-mode): 1.9 cm LEFT ATRIUM              Index  RIGHT ATRIUM           Index LA diam:        4.50 cm  1.79 cm/m   RA Area:     23.10 cm LA Vol (A2C):   121.0 ml 48.13 ml/m  RA Volume:   72.40 ml  28.80 ml/m LA Vol (A4C):   144.0 ml 57.27 ml/m LA Biplane Vol: 138.0 ml 54.89 ml/m  AORTIC VALVE                     PULMONIC VALVE AV Area (Vmax):    2.83 cm      PV Vmax:       0.77 m/s AV Area (Vmean):   2.63 cm      PV Vmean:      44.000 cm/s AV Area (VTI):     3.00 cm      PV VTI:        0.134 m AV Vmax:           160.00 cm/s   PV Peak grad:  2.4 mmHg AV Vmean:          105.000 cm/s  PV Mean grad:  1.0 mmHg AV  VTI:            0.337 m AV Peak Grad:      10.2 mmHg AV Mean Grad:      5.0 mmHg LVOT Vmax:         92.40 cm/s LVOT Vmean:        56.300 cm/s LVOT VTI:          0.206 m LVOT/AV VTI ratio: 0.61  AORTA Ao Root diam: 2.50 cm Ao Asc diam:  3.30 cm MITRAL VALVE MV Area VTI:  0.54 cm    SHUNTS MV Peak grad: 89.9 mmHg   Systemic VTI:  0.21 m MV Mean grad: 61.0 mmHg   Systemic Diam: 2.50 cm MV Vmax:      4.74 m/s    Pulmonic Diam: 3.10 cm MV Vmean:     373.0 cm/s Jenkins Rouge MD Electronically signed by Jenkins Rouge MD Signature Date/Time: 04/15/2021/2:40:53 PM    Final         Scheduled Meds:  amiodarone  200 mg Oral Daily   carvedilol  12.5 mg Oral BID WC   hydrALAZINE  100 mg Oral Q8H   hydrALAZINE  50 mg Oral Once   [START ON 04/18/2021] isosorbide mononitrate  120 mg Oral Daily   isosorbide mononitrate  60 mg Oral Once   rivaroxaban  20 mg Oral Q supper   Continuous Infusions:   LOS: 2 days        Shelly Coss, MD [facility] P1/25/2023, 11:56 AM

## 2021-04-17 NOTE — Plan of Care (Signed)
  Problem: Pain Managment: Goal: General experience of comfort will improve Outcome: Progressing   

## 2021-04-17 NOTE — Anesthesia Postprocedure Evaluation (Signed)
Anesthesia Post Note  Patient: Phillip [name]  Procedure(s) Performed: CARDIOVERSION     Patient location during evaluation: Endoscopy Anesthesia Type: General Level of consciousness: awake and alert Pain management: pain level controlled Vital Signs Assessment: post-procedure vital signs reviewed and stable Respiratory status: spontaneous breathing, nonlabored ventilation, respiratory function stable and patient connected to nasal cannula oxygen Cardiovascular status: blood pressure returned to baseline and stable Postop Assessment: no apparent nausea or vomiting Anesthetic complications: no   No notable events documented.  Last Vitals:  Vitals:   04/17/21 1211 04/17/21 1359  BP: (!) 141/75 (!) 125/56  Pulse:  (!) 52  Resp:  18  Temp:  36.8 C  SpO2:  98%    Last Pain:  Vitals:   04/17/21 1359  TempSrc: Oral  PainSc: 0-No pain                 Belenda Cruise P Hollace Michelli

## 2021-04-17 NOTE — TOC Initial Note (Signed)
Transition of Care [facility]) - Initial/Assessment Note    Patient Details  Name: Cadarius Nevares MRN: [id number] Date of Birth: 1960-09-20  Transition of [facility]) CM/SW Contact:    Bethena Roys, RN Phone Number: 04/17/2021, 3:07 PM  Clinical Narrative: Risk for readmission score was 22%- Assessment not available in Epic. Case Manager spoke with the patient and he is independent from home alone. Patient states he gets medications via [facility]- mail order and [facility] [address] without any issues. Patient drives to appointments. No home health needs identified at this time. Case Manager will continue to follow for additional needs.               Expected Discharge Plan: Home/Self Care Barriers to Discharge: No Barriers Identified   Patient Goals and CMS Choice Patient states their goals for this hospitalization and ongoing recovery are:: to return home.      Expected Discharge Plan and Services Expected Discharge Plan: Home/Self Care In-house Referral: NA Discharge Planning Services: CM Consult Post Acute Care Choice: NA Living arrangements for the past 2 months: Single Family Home                 DME Arranged: N/A DME Agency: NA       HH Arranged: NA          Prior Living Arrangements/Services Living arrangements for the past 2 months: Single Family Home Lives with:: Self Patient language and need for interpreter reviewed:: Yes Do you feel safe going back to the place where you live?: Yes      Need for Family Participation in Patient Care: No (Comment) Care giver support system in place?: No (comment)   Criminal Activity/Legal Involvement Pertinent to Current Situation/Hospitalization: No - Comment as needed   Permission Sought/Granted Permission sought to share information with : Family Supports, Case Freight forwarder, Customer service manager    Emotional Assessment Appearance:: Appears stated age Attitude/Demeanor/Rapport: Engaged Affect (typically  observed): Appropriate Orientation: : Oriented to Situation, Oriented to  Time, Oriented to Place, Oriented to Self Alcohol / Substance Use: Not Applicable Psych Involvement: No (comment)  Admission diagnosis:  Acute on chronic systolic CHF (congestive heart failure) (HCC) [I50.23] Congestive heart failure, unspecified HF chronicity, unspecified heart failure type ([place]) [I50.9] Patient Active Problem List   Diagnosis Date Noted   Acute on chronic systolic CHF (congestive heart failure) ([place]) 04/14/2021   Asthmatic bronchitis 02/26/2021   Preventative health care 03/09/2020   Benign neoplasm of ascending colon    Hyperlipidemia LDL goal <70 05/15/2019   Atrial fibrillation with rapid ventricular response ([place]) 06/22/2017   Asthma with COPD ([place])    Chronic diastolic heart failure ([place])    Anticoagulated 08/17/2015   Hx pulmonary embolism 08/17/2015   Paroxysmal atrial fibrillation ([place]) 08/04/2015   SVT (supraventricular tachycardia) ([place]) 06/22/2015   Morbid obesity ([place]) 03/07/2015   Essential hypertension    Gout    Mild persistent asthma in adult without complication 33/29/5188   OSA on CPAP 05/06/2014   Chronic kidney disease, stage IV (severe) ([place]) 05/06/2014   PCP:  [name], MD Pharmacy:   [id number] [address], [place] [address] Phone: [phone] Fax: [phone]  [facility] [place], [place] [address] [place] [address] Phone: [phone] Fax: [phone]   Readmission Risk Interventions No flowsheet data found.

## 2021-04-17 NOTE — Progress Notes (Signed)
Pt placed himself CPAP for the night.

## 2021-04-17 NOTE — Progress Notes (Signed)
Progress Note  Patient Name: Jefferey Lippmann Date of Encounter: 04/17/2021  [facility] Cardiologist: Glori Bickers, MD   Subjective   Feeling much better. Feeling thirsty.  Denies lightheadedness or dizziness.    Inpatient Medications    Scheduled Meds:  amiodarone  200 mg Oral Daily   carvedilol  12.5 mg Oral BID WC   hydrALAZINE  50 mg Oral Q8H   isosorbide mononitrate  60 mg Oral Daily   rivaroxaban  20 mg Oral Q supper   Continuous Infusions:   PRN Meds: acetaminophen **OR** acetaminophen, ondansetron **OR** ondansetron (ZOFRAN) IV   Vital Signs    Vitals:   04/16/21 2100 04/17/21 0040 04/17/21 0500 04/17/21 0946  BP:  (!) 146/74 (!) 130/57 (!) 150/78  Pulse: (!) 50  (!) 52 (!) 58  Resp: 18 20 18 18   Temp:  98 F (36.7 C) 97.6 F (36.4 C) 98.4 F (36.9 C)  TempSrc:  Oral Oral Oral  SpO2:  98% 98% 98%  Weight:   (!) 142.2 kg   Height:        Intake/Output Summary (Last 24 hours) at 04/17/2021 1056 Last data filed at 04/17/2021 0900 Gross per 24 hour  Intake 340 ml  Output 600 ml  Net -260 ml   Last 3 Weights 04/17/2021 04/13/2021 03/28/2021  Weight (lbs) 313 lb 9.6 oz 317 lb 7.4 oz 319 lb  Weight (kg) 142.248 kg 144 kg 144.697 kg      Telemetry    Sinus bradycardia.  - Personally Reviewed  ECG    N/a - Personally Reviewed  Physical Exam   GEN: No acute distress.   Neck: No JVD Cardiac: RRR.  Braycardic.  No murmurs, rubs, or gallops.  Respiratory: Clear to auscultation bilaterally. GI: Soft, nontender, non-distended  MS: No edema; No deformity. Neuro:  Nonfocal  Psych: Normal affect   Labs    High Sensitivity Troponin:   Recent Labs  Lab 04/08/21 1345 04/08/21 1610 04/13/21 2253 04/14/21 0110  TROPONINIHS 37* 41* 42* 37*     Chemistry Recent Labs  Lab 04/14/21 0229 04/15/21 0500 04/16/21 0416 04/17/21 0243  NA  --  142 143 138  K  --  3.5 3.4* 4.0  CL  --  105 107 103  CO2  --  27 29 27   GLUCOSE  --  79 82 79  BUN  --   38* 35* 39*  CREATININE  --  2.90* 3.05* 3.44*  CALCIUM  --  8.5* 8.5* 8.6*  MG 2.3 2.2 2.2  --   PROT  --  5.0*  --   --   ALBUMIN  --  3.0*  --   --   AST  --  26  --   --   ALT  --  36  --   --   ALKPHOS  --  68  --   --   BILITOT  --  0.9  --   --   GFRNONAA  --  24* 23* 20*  ANIONGAP  --  10 7 8     Lipids No results for input(s): CHOL, TRIG, HDL, LABVLDL, LDLCALC, CHOLHDL in the last 168 hours.  Hematology Recent Labs  Lab 04/13/21 2253 04/15/21 0500 04/16/21 0416  WBC 3.7* 3.1* 3.2*  RBC 3.99* 3.78* 3.72*  HGB 11.6* 10.8* 10.7*  HCT 36.6* 34.8* 34.7*  MCV 91.7 92.1 93.3  MCH 29.1 28.6 28.8  MCHC 31.7 31.0 30.8  RDW 15.8* 15.9* 15.9*  PLT 138*  105* 111*   Thyroid No results for input(s): TSH, FREET4 in the last 168 hours.  BNP Recent Labs  Lab 04/13/21 2253  BNP 1,550.0*    DDimer No results for input(s): DDIMER in the last 168 hours.   Radiology    ECHOCARDIOGRAM COMPLETE  Result Date: 04/15/2021    ECHOCARDIOGRAM REPORT   Patient Name:   MIRON MARXEN Date of Exam: 04/15/2021 Medical Rec #:  [id number]  Height:       69.0 in Accession #:    [id number] Weight:       317.5 lb Date of Birth:  07-12-1960   BSA:          2.514 m Patient Age:    81 years   BP:           171/95 mmHg Patient Gender: M          HR:           61 bpm. Exam Location:  Inpatient Procedure: 2D Echo, Cardiac Doppler, Color Doppler and Strain Analysis Indications:    ACUTE CHF SYSTOLIC  History:        Patient has prior history of Echocardiogram examinations, most                 recent 11/22/2020. CHF, COPD, Arrythmias:Atrial Fibrillation; Risk                 Factors:Hypertension. HYPERTENSIVE CARDIOVASCULAR DX/ PE.  Sonographer:    Beryle Beams Referring Phys: [place]  1. Left ventricular ejection fraction, by estimation, is 40 to 45%. The left ventricle has mildly decreased function. The left ventricle demonstrates global hypokinesis. The left ventricular internal cavity size was  moderately dilated. Left ventricular diastolic parameters are indeterminate. The average left ventricular global longitudinal strain is -6.0 %. The global longitudinal strain is abnormal.  2. Right ventricular systolic function is normal. The right ventricular size is normal.  3. Shadowing in dome of LA cannot r/o calcified thrombus . Left atrial size was moderately dilated.  4. The mitral valve is abnormal. Mild mitral valve regurgitation. No evidence of mitral stenosis.  5. The aortic valve is tricuspid. There is mild calcification of the aortic valve. There is mild thickening of the aortic valve. Aortic valve regurgitation is mild. Aortic valve sclerosis is present, with no evidence of aortic valve stenosis.  6. The inferior vena cava is normal in size with greater than 50% respiratory variability, suggesting right atrial pressure of 3 mmHg. FINDINGS  Left Ventricle: Left ventricular ejection fraction, by estimation, is 40 to 45%. The left ventricle has mildly decreased function. The left ventricle demonstrates global hypokinesis. The average left ventricular global longitudinal strain is -6.0 %. The  global longitudinal strain is abnormal. The left ventricular internal cavity size was moderately dilated. There is no left ventricular hypertrophy. Left ventricular diastolic parameters are indeterminate. Right Ventricle: The right ventricular size is normal. No increase in right ventricular wall thickness. Right ventricular systolic function is normal. Left Atrium: Shadowing in dome of LA cannot r/o calcified thrombus. Left atrial size was moderately dilated. Right Atrium: Right atrial size was normal in size. Pericardium: There is no evidence of pericardial effusion. Mitral Valve: The mitral valve is abnormal. There is mild thickening of the mitral valve leaflet(s). There is mild calcification of the mitral valve leaflet(s). Mild mitral valve regurgitation. No evidence of mitral valve stenosis. MV peak gradient,  89.9  mmHg. The mean mitral valve gradient is 61.0 mmHg. Tricuspid  Valve: The tricuspid valve is normal in structure. Tricuspid valve regurgitation is not demonstrated. No evidence of tricuspid stenosis. Aortic Valve: The aortic valve is tricuspid. There is mild calcification of the aortic valve. There is mild thickening of the aortic valve. Aortic valve regurgitation is mild. Aortic valve sclerosis is present, with no evidence of aortic valve stenosis. Aortic valve mean gradient measures 5.0 mmHg. Aortic valve peak gradient measures 10.2 mmHg. Aortic valve area, by VTI measures 3.00 cm. Pulmonic Valve: The pulmonic valve was normal in structure. Pulmonic valve regurgitation is not visualized. No evidence of pulmonic stenosis. Aorta: The aortic root is normal in size and structure. Venous: The inferior vena cava is normal in size with greater than 50% respiratory variability, suggesting right atrial pressure of 3 mmHg. IAS/Shunts: No atrial level shunt detected by color flow Doppler.  LEFT VENTRICLE PLAX 2D LVIDd:         7.10 cm      Diastology LVIDs:         4.30 cm      LV e' medial:  7.83 cm/s LV PW:         1.20 cm      LV e' lateral: 7.29 cm/s LV IVS:        1.10 cm LVOT diam:     2.50 cm      2D Longitudinal Strain LV SV:         101          2D Strain GLS Avg:     -6.0 % LV SV Index:   40 LVOT Area:     4.91 cm  LV Volumes (MOD) LV vol d, MOD A2C: 224.0 ml LV vol d, MOD A4C: 278.0 ml LV vol s, MOD A2C: 143.0 ml LV vol s, MOD A4C: 159.0 ml LV SV MOD A2C:     81.0 ml LV SV MOD A4C:     278.0 ml LV SV MOD BP:      100.5 ml RIGHT VENTRICLE RV S prime:     11.30 cm/s RVOT diam:      3.10 cm TAPSE (M-mode): 1.9 cm LEFT ATRIUM              Index        RIGHT ATRIUM           Index LA diam:        4.50 cm  1.79 cm/m   RA Area:     23.10 cm LA Vol (A2C):   121.0 ml 48.13 ml/m  RA Volume:   72.40 ml  28.80 ml/m LA Vol (A4C):   144.0 ml 57.27 ml/m LA Biplane Vol: 138.0 ml 54.89 ml/m  AORTIC VALVE                      PULMONIC VALVE AV Area (Vmax):    2.83 cm      PV Vmax:       0.77 m/s AV Area (Vmean):   2.63 cm      PV Vmean:      44.000 cm/s AV Area (VTI):     3.00 cm      PV VTI:        0.134 m AV Vmax:           160.00 cm/s   PV Peak grad:  2.4 mmHg AV Vmean:          105.000 cm/s  PV Mean grad:  1.0 mmHg AV  VTI:            0.337 m AV Peak Grad:      10.2 mmHg AV Mean Grad:      5.0 mmHg LVOT Vmax:         92.40 cm/s LVOT Vmean:        56.300 cm/s LVOT VTI:          0.206 m LVOT/AV VTI ratio: 0.61  AORTA Ao Root diam: 2.50 cm Ao Asc diam:  3.30 cm MITRAL VALVE MV Area VTI:  0.54 cm    SHUNTS MV Peak grad: 89.9 mmHg   Systemic VTI:  0.21 m MV Mean grad: 61.0 mmHg   Systemic Diam: 2.50 cm MV Vmax:      4.74 m/s    Pulmonic Diam: 3.10 cm MV Vmean:     373.0 cm/s Jenkins Rouge MD Electronically signed by Jenkins Rouge MD Signature Date/Time: 04/15/2021/2:40:53 PM    Final     Cardiac Studies   Echo 04/15/21:  1. Left ventricular ejection fraction, by estimation, is 40 to 45%. The  left ventricle has mildly decreased function. The left ventricle  demonstrates global hypokinesis. The left ventricular internal cavity size  was moderately dilated. Left ventricular  diastolic parameters are indeterminate. The average left ventricular  global longitudinal strain is -6.0 %. The global longitudinal strain is  abnormal.   2. Right ventricular systolic function is normal. The right ventricular  size is normal.   3. Shadowing in dome of LA cannot r/o calcified thrombus . Left atrial  size was moderately dilated.   4. The mitral valve is abnormal. Mild mitral valve regurgitation. No  evidence of mitral stenosis.   5. The aortic valve is tricuspid. There is mild calcification of the  aortic valve. There is mild thickening of the aortic valve. Aortic valve  regurgitation is mild. Aortic valve sclerosis is present, with no evidence  of aortic valve stenosis.   6. The inferior vena cava is normal in size with greater  than 50%  respiratory variability, suggesting right atrial pressure of 3 mmHg.   Patient Profile     Mr. 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  His volume status is much better and he is looking euvolemic.  He was net -1.3 L yesterday and -5.4 L this admission.  He is renal function continues to decline.  I think he is over diuresed.  He did report feeling short of breath that appears euvolemic.  I have asked him to increase his hydration.  Chest x-ray is pending but his lungs sound clear.  I also added on a BNP to his morning labs.  We will recheck a BMP this afternoon after hydration.  If his renal function is improving we will plan to discharge him with resumption of diuretics on Friday and heart failure clinic follow-up with BMP check early next week.  His home carvedilol dose has been reduced due to bradycardia and now that he is back in sinus rhythm.  He was initially hypotensive and hydralazine and Imdur were held.  Blood pressures are poorly controlled.  We will titrate back to his home dose of 100 mg 3 times daily and Imdur 120  mg daily.    #Atrial flutter: Underwent cardioversion this morning.  Now in sinus bradycardia.  Reducing carvedilol as above.  Continue Eliquis.  #CKD 4: AoCKD.  Holding diuresis and hydrating as above.       For questions or updates, please contact [place] Please consult [email or web address] for contact info under        Signed, Skeet Latch, MD  04/17/2021, 10:56 AM

## 2021-04-17 NOTE — Progress Notes (Signed)
Mobility Specialist Progress Note    04/17/21 1221  Mobility  Activity Ambulated independently in hallway  Level of Assistance Standby assist, set-up cues, supervision of patient - no hands on  Assistive Device None  Distance Ambulated (ft) 480 ft  Activity Response Tolerated well  $Mobility charge 1 Mobility   During Mobility: 82 HR  Pt received in bed and agreeable. No complaints on walk. Returned to sitting EOB with call bell in reach.   [facility] Mobility Specialist  M.S. 2C and 6E: [phone] M.S. 4E: [phone]

## 2021-04-18 LAB — BASIC METABOLIC PANEL
Anion gap: 6 (ref 5–15)
BUN: 39 mg/dL — ABNORMAL HIGH (ref 6–20)
CO2: 30 mmol/L (ref 22–32)
Calcium: 8.8 mg/dL — ABNORMAL LOW (ref 8.9–10.3)
Chloride: 105 mmol/L (ref 98–111)
Creatinine, Ser: 3.17 mg/dL — ABNORMAL HIGH (ref 0.61–1.24)
GFR, Estimated: 22 mL/min — ABNORMAL LOW (ref >60)
Glucose, Bld: 88 mg/dL (ref 70–99)
Potassium: 3.5 mmol/L (ref 3.5–5.1)
Sodium: 141 mmol/L (ref 135–145)

## 2021-04-18 MED ORDER — HYDRALAZINE HCL 100 MG PO TABS
100.0000 mg | ORAL_TABLET | Freq: Three times a day (TID) | ORAL | 0 refills | Status: AC
Start: 2021-04-18 — End: ?

## 2021-04-18 MED ORDER — TORSEMIDE 20 MG PO TABS: 40.0000 mg | ORAL_TABLET | Freq: Two times a day (BID) | ORAL | Status: DC

## 2021-04-18 MED ORDER — DOXAZOSIN MESYLATE 2 MG PO TABS
2.0000 mg | ORAL_TABLET | Freq: Every day | ORAL | Status: DC
  Administered 2021-04-18: 2 mg via ORAL
  Filled 2021-04-18: qty 1

## 2021-04-18 MED ORDER — CARVEDILOL 12.5 MG PO TABS: 12.5000 mg | ORAL_TABLET | Freq: Two times a day (BID) | ORAL | 0 refills | Status: DC

## 2021-04-18 MED ORDER — DOXAZOSIN MESYLATE 2 MG PO TABS: 2.0000 mg | ORAL_TABLET | Freq: Every day | ORAL | 0 refills | Status: DC

## 2021-04-18 NOTE — Discharge Summary (Signed)
Physician Discharge Summary  Phillip [name]:[id number] DOB: 19-Dec-1960 DOA: 04/13/2021  PCP: [name], MD  Admit date: 04/13/2021 Discharge date: 04/18/2021  Admitted From: Home Disposition:  Home  Discharge Condition:Stable CODE STATUS:FULL Diet recommendation: Heart Healthy   Brief/Interim Summary: Patient is a 61 year old male with history of paroxysmal A. fib on Xarelto, chronic systolic  congestive heart failure COPD, obesity, OSA on CPAP who presented with shortness of breath, palpitations.  He follows with advanced heart failure clinic.  On presentation, chest x-ray showed cardiomegaly with vascular congestion.  BNP was elevated.  EKG showed A. fib with RVR.Patient was admitted for the management of acute on chronic systolic congestive heart failure, A. fib with RVR.  Cardiology consulted.  Underwent DCCV  on 1/24. Currently normal sinus rhythm.  Cardiology has cleared him for discharge.  Medically stable for discharge home today.  Following problems were addressed during his hospitalization:   Acute on chronic systolic congestive heart failure: Echo showed EF of 40 to 45%, same as before.  Patient with shortness of breath.  Elevated BNP.  Looked volume overloaded on presentation.  Chest x-ray showed vascular congestion.  Initially started on IV Lasix.  Currently following.  Continue carvedilol.  He is also on hydralazine and nitrates.  He will resume torsemide 40 mg twice a day.   Paroxysmal A. fib/A flutter: Underwent cardioversion on 1/24.  Now in sinus bradycardia.  On carvedilol, amiodarone amiodarone.  On Xarelto for anticoagulation   CKD stage IV: Baseline creatinine around 2.9.  Creatinine now close to baseline.  He follows with [facility].   Pancytopenia/Normocytic anemia: Anemia associated  with chronic disease/CKD.  Currently hemoglobin stable in the range of 10.   Morbid obesity: BMI 46.8  Discharge Diagnoses:  Principal Problem:   Acute on chronic  systolic CHF (congestive heart failure) (HCC) Active Problems:   OSA on CPAP   Chronic kidney disease, stage IV (severe) (HCC)   Essential hypertension   Morbid obesity (HCC)   Paroxysmal atrial fibrillation ([place])   Anticoagulated    Discharge Instructions  Discharge Instructions     Diet - low sodium heart healthy   Complete by: As directed    Discharge instructions   Complete by: As directed    1)Please take prescribed medications as instructed 2)Follow up with cardiology .  You have an appointment on February 6 3)Follow up with your PCP   Increase activity slowly   Complete by: As directed       Allergies as of 04/18/2021       Reactions   Other Anaphylaxis   mushrooms        Medication List     TAKE these medications    albuterol 108 (90 Base) MCG/ACT inhaler Commonly known as: ProAir HFA Inhale 2 puffs into the lungs every 4 (four) hours as needed for wheezing or shortness of breath. What changed: reasons to take this   albuterol (2.5 MG/3ML) 0.083% nebulizer solution Commonly known as: PROVENTIL INHALE 1 VIAL VIA NEBULIZER EVERY 6 HOURS AS NEEDED FOR WHEEZING/SHORTNESS OF BREATH. What changed:  how much to take how to take this when to take this reasons to take this additional instructions   allopurinol 300 MG tablet Commonly known as: ZYLOPRIM TAKE 1 TABLET BY MOUTH EVERY DAY   amiodarone 200 MG tablet Commonly known as: PACERONE Take 1 tablet (200 mg total) by mouth daily.   budesonide-formoterol 80-4.5 MCG/ACT inhaler Commonly known as: Symbicort Inhale 2 puffs into the lungs 2 (  two) times daily.   carvedilol 12.5 MG tablet Commonly known as: COREG Take 1 tablet (12.5 mg total) by mouth 2 (two) times daily with a meal. What changed:  medication strength how much to take   colchicine 0.6 MG tablet Take 0.6 mg by mouth daily as needed (gout flares).   doxazosin 2 MG tablet Commonly known as: CARDURA Take 1 tablet (2 mg total) by  mouth daily.   hydrALAZINE 100 MG tablet Commonly known as: APRESOLINE Take 1 tablet (100 mg total) by mouth every 8 (eight) hours. What changed: See the new instructions.   isosorbide mononitrate 120 MG 24 hr tablet Commonly known as: IMDUR Take 1 tablet (120 mg total) by mouth daily.   Klor-Con M20 20 MEQ tablet Generic drug: potassium chloride SA TAKE 2 TABLETS (40 MEQ) EVERY MORNING AND 1 TABLET (20 MEQ) EVERY EVENING What changed: See the new instructions.   naproxen sodium 220 MG tablet Commonly known as: ALEVE Take 440 mg by mouth daily as needed (pain/headache).   oxymetazoline 0.05 % nasal spray Commonly known as: Afrin Nasal Spray Place 1 spray into both nostrils 2 (two) times daily as needed for congestion.   PROBIOTIC DAILY PO Take 1 capsule by mouth daily.   psyllium 0.52 g capsule Commonly known as: REGULOID Take 1.04 g by mouth daily.   torsemide 20 MG tablet Commonly known as: DEMADEX Take 2 tablets (40 mg total) by mouth 2 (two) times daily.   UNABLE TO FIND Med Name: CPAP with sleep   Vitamin D3 50 MCG (2000 UT) Tabs Take 2,000 Units by mouth daily.   Xarelto 20 MG Tabs tablet Generic drug: rivaroxaban TAKE 1 TABLET DAILY WITH SUPPER (NEEDS APPOINTMENT FOR FURTHER REFILLS) What changed: See the new instructions.        Follow-up Information     [facility]. Go on 04/29/2021.   Specialty: Cardiology Why: 9:30 AM, parking code [address] information: [address] [id number] [place]        [name] I, MD. Schedule an appointment as soon as possible for a visit in 1 week(s).   Specialties: Internal Medicine, Pediatrics Contact information: [place] [address] [phone]                Allergies  Allergen Reactions   Other Anaphylaxis    mushrooms     Consultations: Cardiology   Procedures/Studies: DG Chest 2 View  Result Date: 04/13/2021 CLINICAL DATA:  Shortness of breath. EXAM: CHEST - 2 VIEW COMPARISON:  Chest radiograph dated 04/08/2021. FINDINGS: Similar cardiomegaly with mild vascular congestion. No focal consolidation, pleural effusion or pneumothorax. No acute osseous pathology. IMPRESSION: Cardiomegaly with mild vascular congestion. No focal consolidation. Electronically Signed   By: Anner Crete M.D.   On: 04/13/2021 23:09   DG Chest 2 View  Result Date: 04/08/2021 CLINICAL DATA:  Chest pain. EXAM: CHEST - 2 VIEW COMPARISON:  02/23/2021 FINDINGS: The lungs are clear without focal pneumonia, edema, pneumothorax or pleural effusion. The cardio pericardial silhouette is enlarged. The visualized bony structures of the thorax show no acute abnormality. IMPRESSION: Cardiomegaly.  No acute cardiopulmonary findings. Electronically Signed   By: Misty Stanley M.D.   On: 04/08/2021 14:01   DG CHEST PORT 1 VIEW  Result Date: 04/17/2021 CLINICAL DATA:  Acute combined systolic and diastolic heart failure. Cardioversion yesterday. EXAM: PORTABLE CHEST 1 VIEW COMPARISON:  Radiographs 04/13/2021 and 04/08/2021.  CT 06/20/2015. FINDINGS: 1110 hours. Stable cardiomegaly and chronic vascular congestion. No overt pulmonary edema, confluent airspace opacity, pneumothorax or significant pleural effusion identified. The bones appear unremarkable. Telemetry leads overlie the chest. IMPRESSION: Stable radiographic appearance of the chest. Chronic cardiomegaly and mild vascular congestion. Electronically Signed   By: Richardean Sale M.D.   On: 04/17/2021 11:22   ECHOCARDIOGRAM COMPLETE  Result Date: 04/15/2021    ECHOCARDIOGRAM REPORT   Patient Name:   Phillip [name] Date of Exam: 04/15/2021 Medical Rec #:  [id number]  Height:       69.0 in Accession #:    [id number] Weight:       317.5 lb Date of Birth:  [birth year]   BSA:          2.514 m Patient Age:     68 years   BP:           171/95 mmHg Patient Gender: M          HR:           61 bpm. Exam Location:  Inpatient Procedure: 2D Echo, Cardiac Doppler, Color Doppler and Strain Analysis Indications:    ACUTE CHF SYSTOLIC  History:        Patient has prior history of Echocardiogram examinations, most                 recent 11/22/2020. CHF, COPD, Arrythmias:Atrial Fibrillation; Risk                 Factors:Hypertension. HYPERTENSIVE CARDIOVASCULAR DX/ PE.  Sonographer:    Beryle Beams Referring Phys: [place]  1. Left ventricular ejection fraction, by estimation, is 40 to 45%. The left ventricle has mildly decreased function. The left ventricle demonstrates global hypokinesis. The left ventricular internal cavity size was moderately dilated. Left ventricular diastolic parameters are indeterminate. The average left ventricular global longitudinal strain is -6.0 %. The global longitudinal strain is abnormal.  2. Right ventricular systolic function is normal. The right ventricular size is normal.  3. Shadowing in dome of LA cannot r/o calcified thrombus . Left atrial size was moderately dilated.  4. The mitral valve is abnormal. Mild mitral valve regurgitation. No evidence of mitral stenosis.  5. The aortic valve is tricuspid. There is mild calcification of the aortic valve. There is mild thickening of the aortic valve. Aortic valve regurgitation is mild. Aortic valve sclerosis is present, with no evidence of aortic valve stenosis.  6. The inferior vena cava is normal in size with greater than 50% respiratory variability, suggesting right atrial pressure of 3 mmHg. FINDINGS  Left Ventricle: Left ventricular ejection fraction, by estimation, is 40 to 45%. The left ventricle has mildly decreased function. The left ventricle demonstrates global hypokinesis. The average left ventricular global longitudinal strain is -6.0 %. The  global longitudinal strain is abnormal. The left ventricular internal cavity size  was moderately dilated. There is no left ventricular hypertrophy. Left ventricular diastolic parameters are indeterminate. Right Ventricle: The right ventricular size is normal. No increase in right ventricular wall thickness. Right ventricular systolic function is normal. Left Atrium: Shadowing in dome of LA cannot r/o calcified thrombus. Left atrial size was moderately dilated. Right Atrium: Right atrial size was normal in size. Pericardium: There is no evidence of pericardial effusion. Mitral Valve: The mitral valve is abnormal. There is mild thickening of the mitral valve leaflet(s). There is mild calcification of the mitral valve leaflet(s). Mild mitral valve regurgitation. No evidence of mitral valve stenosis.  MV peak gradient, 89.9  mmHg. The mean mitral valve gradient is 61.0 mmHg. Tricuspid Valve: The tricuspid valve is normal in structure. Tricuspid valve regurgitation is not demonstrated. No evidence of tricuspid stenosis. Aortic Valve: The aortic valve is tricuspid. There is mild calcification of the aortic valve. There is mild thickening of the aortic valve. Aortic valve regurgitation is mild. Aortic valve sclerosis is present, with no evidence of aortic valve stenosis. Aortic valve mean gradient measures 5.0 mmHg. Aortic valve peak gradient measures 10.2 mmHg. Aortic valve area, by VTI measures 3.00 cm. Pulmonic Valve: The pulmonic valve was normal in structure. Pulmonic valve regurgitation is not visualized. No evidence of pulmonic stenosis. Aorta: The aortic root is normal in size and structure. Venous: The inferior vena cava is normal in size with greater than 50% respiratory variability, suggesting right atrial pressure of 3 mmHg. IAS/Shunts: No atrial level shunt detected by color flow Doppler.  LEFT VENTRICLE PLAX 2D LVIDd:         7.10 cm      Diastology LVIDs:         4.30 cm      LV e' medial:  7.83 cm/s LV PW:         1.20 cm      LV e' lateral: 7.29 cm/s LV IVS:        1.10 cm LVOT diam:      2.50 cm      2D Longitudinal Strain LV SV:         101          2D Strain GLS Avg:     -6.0 % LV SV Index:   40 LVOT Area:     4.91 cm  LV Volumes (MOD) LV vol d, MOD A2C: 224.0 ml LV vol d, MOD A4C: 278.0 ml LV vol s, MOD A2C: 143.0 ml LV vol s, MOD A4C: 159.0 ml LV SV MOD A2C:     81.0 ml LV SV MOD A4C:     278.0 ml LV SV MOD BP:      100.5 ml RIGHT VENTRICLE RV S prime:     11.30 cm/s RVOT diam:      3.10 cm TAPSE (M-mode): 1.9 cm LEFT ATRIUM              Index        RIGHT ATRIUM           Index LA diam:        4.50 cm  1.79 cm/m   RA Area:     23.10 cm LA Vol (A2C):   121.0 ml 48.13 ml/m  RA Volume:   72.40 ml  28.80 ml/m LA Vol (A4C):   144.0 ml 57.27 ml/m LA Biplane Vol: 138.0 ml 54.89 ml/m  AORTIC VALVE                     PULMONIC VALVE AV Area (Vmax):    2.83 cm      PV Vmax:       0.77 m/s AV Area (Vmean):   2.63 cm      PV Vmean:      44.000 cm/s AV Area (VTI):     3.00 cm      PV VTI:        0.134 m AV Vmax:           160.00 cm/s   PV Peak grad:  2.4 mmHg AV Vmean:  105.000 cm/s  PV Mean grad:  1.0 mmHg AV VTI:            0.337 m AV Peak Grad:      10.2 mmHg AV Mean Grad:      5.0 mmHg LVOT Vmax:         92.40 cm/s LVOT Vmean:        56.300 cm/s LVOT VTI:          0.206 m LVOT/AV VTI ratio: 0.61  AORTA Ao Root diam: 2.50 cm Ao Asc diam:  3.30 cm MITRAL VALVE MV Area VTI:  0.54 cm    SHUNTS MV Peak grad: 89.9 mmHg   Systemic VTI:  0.21 m MV Mean grad: 61.0 mmHg   Systemic Diam: 2.50 cm MV Vmax:      4.74 m/s    Pulmonic Diam: 3.10 cm MV Vmean:     373.0 cm/s Jenkins Rouge MD Electronically signed by Jenkins Rouge MD Signature Date/Time: 04/15/2021/2:40:53 PM    Final       Subjective: Patient seen and examined at the bedside this morning.  Hemodynamically stable for discharge today  Discharge Exam: Vitals:   04/18/21 0504 04/18/21 0832  BP: (!) 156/62   Pulse: 60 64  Resp: 19 18  Temp: 98.6 F (37 C)   SpO2: 98% 97%   Vitals:   04/17/21 2100 04/17/21 2126 04/18/21 0504  04/18/21 0832  BP:  (!) 142/63 (!) 156/62   Pulse: (!) 55  60 64  Resp: 18  19 18   Temp:   98.6 F (37 C)   TempSrc:   Oral   SpO2:   98% 97%  Weight:   (!) 142.7 kg   Height:        General: Pt is alert, awake, not in acute distress, morbidly obese Cardiovascular: RRR, S1/S2 +, no rubs, no gallops Respiratory: CTA bilaterally, no wheezing, no rhonchi Abdominal: Soft, NT, ND, bowel sounds + Extremities: no edema, no cyanosis    The results of significant diagnostics from this hospitalization (including imaging, microbiology, ancillary and laboratory) are listed below for reference.     Microbiology: Recent Results (from the past 240 hour(s))  Resp Panel by RT-PCR (Flu A&B, Covid) Nasopharyngeal Swab     Status: None   Collection Time: 04/14/21 12:27 AM   Specimen: Nasopharyngeal Swab; Nasopharyngeal(NP) swabs in vial transport medium  Result Value Ref Range Status   SARS Coronavirus 2 by RT PCR NEGATIVE NEGATIVE Final    Comment: (NOTE) SARS-CoV-2 target nucleic acids are NOT DETECTED.  The SARS-CoV-2 RNA is generally detectable in upper respiratory specimens during the acute phase of infection. The lowest concentration of SARS-CoV-2 viral copies this assay can detect is 138 copies/mL. A negative result does not preclude SARS-Cov-2 infection and should not be used as the sole basis for treatment or other patient management decisions. A negative result may occur with  improper specimen collection/handling, submission of specimen other than nasopharyngeal swab, presence of viral mutation(s) within the areas targeted by this assay, and inadequate number of viral copies(<138 copies/mL). A negative result must be combined with clinical observations, patient history, and epidemiological information. The expected result is Negative.  Fact Sheet for Patients:  EntrepreneurPulse.com.au  Fact Sheet for Healthcare Providers:   IncredibleEmployment.be  This test is no t yet approved or cleared by the [place] FDA and  has been authorized for detection and/or diagnosis of SARS-CoV-2 by FDA under an Emergency Use Authorization (EUA). This EUA will remain  in effect (meaning this test can be used) for the duration of the COVID-19 declaration under Section 564(b)(1) of the Act, 21 U.S.C.section 360bbb-3(b)(1), unless the authorization is terminated  or revoked sooner.       Influenza A by PCR NEGATIVE NEGATIVE Final   Influenza B by PCR NEGATIVE NEGATIVE Final    Comment: (NOTE) The Xpert Xpress SARS-CoV-2/FLU/RSV plus assay is intended as an aid in the diagnosis of influenza from Nasopharyngeal swab specimens and should not be used as a sole basis for treatment. Nasal washings and aspirates are unacceptable for Xpert Xpress SARS-CoV-2/FLU/RSV testing.  Fact Sheet for Patients: EntrepreneurPulse.com.au  Fact Sheet for Healthcare Providers: IncredibleEmployment.be  This test is not yet approved or cleared by the [place] FDA and has been authorized for detection and/or diagnosis of SARS-CoV-2 by FDA under an Emergency Use Authorization (EUA). This EUA will remain in effect (meaning this test can be used) for the duration of the COVID-19 declaration under Section 564(b)(1) of the Act, 21 U.S.C. section 360bbb-3(b)(1), unless the authorization is terminated or revoked.  Performed at [facility], [place] [address] [place] [address]      Labs: BNP (last 3 results) Recent Labs    04/08/21 1845 04/13/21 2253 04/17/21 1157  BNP 1,026.9* 1,550.0* 546.5*   Basic Metabolic Panel: Recent Labs  Lab 04/14/21 0229 04/15/21 0500 04/16/21 0416 04/17/21 0243 04/17/21 1157 04/18/21 0623  NA  --  142 143 138 139 141  K  --  3.5 3.4* 4.0 3.8 3.5  CL  --  105 107 103 103 105  CO2  --  27 29 27 31 30   GLUCOSE  --  79 82 79 86  88  BUN  --  38* 35* 39* 38* 39*  CREATININE  --  2.90* 3.05* 3.44* 3.27* 3.17*  CALCIUM  --  8.5* 8.5* 8.6* 8.7* 8.8*  MG 2.3 2.2 2.2  --   --   --   PHOS  --   --  3.8  --   --   --    Liver Function Tests: Recent Labs  Lab 04/15/21 0500  AST 26  ALT 36  ALKPHOS 68  BILITOT 0.9  PROT 5.0*  ALBUMIN 3.0*   No results for input(s): LIPASE, AMYLASE in the last 168 hours. No results for input(s): AMMONIA in the last 168 hours. CBC: Recent Labs  Lab 04/13/21 2253 04/15/21 0500 04/16/21 0416  WBC 3.7* 3.1* 3.2*  NEUTROABS 2.3 1.8  --   HGB 11.6* 10.8* 10.7*  HCT 36.6* 34.8* 34.7*  MCV 91.7 92.1 93.3  PLT 138* 105* 111*   Cardiac Enzymes: No results for input(s): CKTOTAL, CKMB, CKMBINDEX, TROPONINI in the last 168 hours. BNP: Invalid input(s): POCBNP CBG: No results for input(s): GLUCAP in the last 168 hours. D-Dimer No results for input(s): DDIMER in the last 72 hours. Hgb A1c No results for input(s): HGBA1C in the last 72 hours. Lipid Profile No results for input(s): CHOL, HDL, LDLCALC, TRIG, CHOLHDL, LDLDIRECT in the last 72 hours. Thyroid function studies No results for input(s): TSH, T4TOTAL, T3FREE, THYROIDAB in the last 72 hours.  Invalid input(s): FREET3 Anemia work up No results for input(s): VITAMINB12, FOLATE, FERRITIN, TIBC, IRON, RETICCTPCT in the last 72 hours. Urinalysis    Component Value Date/Time   COLORURINE STRAW (A) 09/17/2017 2350   APPEARANCEUR CLEAR 09/17/2017 2350   LABSPEC 1.009 09/17/2017 2350   PHURINE 5.0 09/17/2017 2350   GLUCOSEU NEGATIVE 09/17/2017 2350  HGBUR NEGATIVE 09/17/2017 2350   BILIRUBINUR NEGATIVE 09/17/2017 2350   KETONESUR NEGATIVE 09/17/2017 2350   PROTEINUR NEGATIVE 09/17/2017 2350   UROBILINOGEN 1.0 12/04/2014 0546   NITRITE NEGATIVE 09/17/2017 2350   LEUKOCYTESUR NEGATIVE 09/17/2017 2350   Sepsis Labs Invalid input(s): PROCALCITONIN,  WBC,  LACTICIDVEN Microbiology Recent Results (from the past 240  hour(s))  Resp Panel by RT-PCR (Flu A&B, Covid) Nasopharyngeal Swab     Status: None   Collection Time: 04/14/21 12:27 AM   Specimen: Nasopharyngeal Swab; Nasopharyngeal(NP) swabs in vial transport medium  Result Value Ref Range Status   SARS Coronavirus 2 by RT PCR NEGATIVE NEGATIVE Final    Comment: (NOTE) SARS-CoV-2 target nucleic acids are NOT DETECTED.  The SARS-CoV-2 RNA is generally detectable in upper respiratory specimens during the acute phase of infection. The lowest concentration of SARS-CoV-2 viral copies this assay can detect is 138 copies/mL. A negative result does not preclude SARS-Cov-2 infection and should not be used as the sole basis for treatment or other patient management decisions. A negative result may occur with  improper specimen collection/handling, submission of specimen other than nasopharyngeal swab, presence of viral mutation(s) within the areas targeted by this assay, and inadequate number of viral copies(<138 copies/mL). A negative result must be combined with clinical observations, patient history, and epidemiological information. The expected result is Negative.  Fact Sheet for Patients:  EntrepreneurPulse.com.au  Fact Sheet for Healthcare Providers:  IncredibleEmployment.be  This test is no t yet approved or cleared by the [place] FDA and  has been authorized for detection and/or diagnosis of SARS-CoV-2 by FDA under an Emergency Use Authorization (EUA). This EUA will remain  in effect (meaning this test can be used) for the duration of the COVID-19 declaration under Section 564(b)(1) of the Act, 21 U.S.C.section 360bbb-3(b)(1), unless the authorization is terminated  or revoked sooner.       Influenza A by PCR NEGATIVE NEGATIVE Final   Influenza B by PCR NEGATIVE NEGATIVE Final    Comment: (NOTE) The Xpert Xpress SARS-CoV-2/FLU/RSV plus assay is intended as an aid in the diagnosis of influenza from  Nasopharyngeal swab specimens and should not be used as a sole basis for treatment. Nasal washings and aspirates are unacceptable for Xpert Xpress SARS-CoV-2/FLU/RSV testing.  Fact Sheet for Patients: EntrepreneurPulse.com.au  Fact Sheet for Healthcare Providers: IncredibleEmployment.be  This test is not yet approved or cleared by the [place] FDA and has been authorized for detection and/or diagnosis of SARS-CoV-2 by FDA under an Emergency Use Authorization (EUA). This EUA will remain in effect (meaning this test can be used) for the duration of the COVID-19 declaration under Section 564(b)(1) of the Act, 21 U.S.C. section 360bbb-3(b)(1), unless the authorization is terminated or revoked.  Performed at [facility], [place] [address], [place], [place] [address]     Please note: You were cared for by a hospitalist during your hospital stay. Once you are discharged, your primary care physician will handle any further medical issues. Please note that NO REFILLS for any discharge medications will be authorized once you are discharged, as it is imperative that you return to your primary care physician (or establish a relationship with a primary care physician if you do not have one) for your post hospital discharge needs so that they can reassess your need for medications and monitor your lab values.    Time coordinating discharge: 40 minutes  SIGNED:   Shelly Coss, MD  [facility] 04/18/2021, 10:43 AM Pager  [phone]  If 7PM-7AM, please contact night-coverage [email or web address]

## 2021-04-18 NOTE — Progress Notes (Signed)
Progress Note  Patient Name: Phillip [name] Date of Encounter: 04/18/2021  [facility] Cardiologist: Glori Bickers, MD   Subjective   Feeling much better.  Respiratory status is at baseline.  Inpatient Medications    Scheduled Meds:  amiodarone  200 mg Oral Daily   carvedilol  12.5 mg Oral BID WC   hydrALAZINE  100 mg Oral Q8H   isosorbide mononitrate  120 mg Oral Daily   mometasone-formoterol  2 puff Inhalation BID   rivaroxaban  20 mg Oral Q supper   Continuous Infusions:   PRN Meds: acetaminophen **OR** acetaminophen, ondansetron **OR** ondansetron (ZOFRAN) IV   Vital Signs    Vitals:   04/17/21 2100 04/17/21 2126 04/18/21 0504 04/18/21 0832  BP:  (!) 142/63 (!) 156/62   Pulse: (!) 55  60 64  Resp: 18  19 18   Temp:   98.6 F (37 C)   TempSrc:   Oral   SpO2:   98% 97%  Weight:   (!) 142.7 kg   Height:        Intake/Output Summary (Last 24 hours) at 04/18/2021 0906 Last data filed at 04/18/2021 0500 Gross per 24 hour  Intake 360 ml  Output 800 ml  Net -440 ml   Last 3 Weights 04/18/2021 04/17/2021 04/13/2021  Weight (lbs) 314 lb 8 oz 313 lb 9.6 oz 317 lb 7.4 oz  Weight (kg) 142.656 kg 142.248 kg 144 kg      Telemetry    Sinus bradycardia.  - Personally Reviewed  ECG    N/a - Personally Reviewed  Physical Exam   VS:  BP (!) 156/62 (BP Location: Right Arm)    Pulse 64    Temp 98.6 F (37 C) (Oral)    Resp 18    Ht 5\' 9"  (1.753 m)    Wt (!) 142.7 kg    SpO2 97%    BMI 46.44 kg/m  , BMI Body mass index is 46.44 kg/m. GENERAL:  Well appearing HEENT: Pupils equal round and reactive, fundi not visualized, oral mucosa unremarkable NECK:  No jugular venous distention, waveform within normal limits, carotid upstroke brisk and symmetric, no bruits, no thyromegaly LUNGS:  Clear to auscultation bilaterally HEART:  RRR.  PMI not displaced or sustained,S1 and S2 within normal limits, no S3, no S4, no clicks, no rubs, no murmurs ABD:  Flat, positive bowel  sounds normal in frequency in pitch, no bruits, no rebound, no guarding, no midline pulsatile mass, no hepatomegaly, no splenomegaly EXT:  2 plus pulses throughout, no edema, no cyanosis no clubbing SKIN:  No rashes no nodules NEURO:  Cranial nerves II through XII grossly intact, motor grossly intact throughout PSYCH:  Cognitively intact, oriented to person place and time  Labs    High Sensitivity Troponin:   Recent Labs  Lab 04/08/21 1345 04/08/21 1610 04/13/21 2253 04/14/21 0110  TROPONINIHS 37* 41* 42* 37*     Chemistry Recent Labs  Lab 04/14/21 0229 04/15/21 0500 04/16/21 0416 04/17/21 0243 04/17/21 1157 04/18/21 0623  NA  --  142 143 138 139 141  K  --  3.5 3.4* 4.0 3.8 3.5  CL  --  105 107 103 103 105  CO2  --  27 29 27 31 30   GLUCOSE  --  79 82 79 86 88  BUN  --  38* 35* 39* 38* 39*  CREATININE  --  2.90* 3.05* 3.44* 3.27* 3.17*  CALCIUM  --  8.5* 8.5* 8.6* 8.7* 8.8*  MG 2.3 2.2 2.2  --   --   --   PROT  --  5.0*  --   --   --   --   ALBUMIN  --  3.0*  --   --   --   --   AST  --  26  --   --   --   --   ALT  --  36  --   --   --   --   ALKPHOS  --  68  --   --   --   --   BILITOT  --  0.9  --   --   --   --   GFRNONAA  --  24* 23* 20* 21* 22*  ANIONGAP  --  10 7 8 5 6     Lipids No results for input(s): CHOL, TRIG, HDL, LABVLDL, LDLCALC, CHOLHDL in the last 168 hours.  Hematology Recent Labs  Lab 04/13/21 2253 04/15/21 0500 04/16/21 0416  WBC 3.7* 3.1* 3.2*  RBC 3.99* 3.78* 3.72*  HGB 11.6* 10.8* 10.7*  HCT 36.6* 34.8* 34.7*  MCV 91.7 92.1 93.3  MCH 29.1 28.6 28.8  MCHC 31.7 31.0 30.8  RDW 15.8* 15.9* 15.9*  PLT 138* 105* 111*   Thyroid No results for input(s): TSH, FREET4 in the last 168 hours.  BNP Recent Labs  Lab 04/13/21 2253 04/17/21 1157  BNP 1,550.0* 665.4*    DDimer No results for input(s): DDIMER in the last 168 hours.   Radiology    DG CHEST PORT 1 VIEW  Result Date: 04/17/2021 CLINICAL DATA:  Acute combined systolic and  diastolic heart failure. Cardioversion yesterday. EXAM: PORTABLE CHEST 1 VIEW COMPARISON:  Radiographs 04/13/2021 and 04/08/2021.  CT 06/20/2015. FINDINGS: 1110 hours. Stable cardiomegaly and chronic vascular congestion. No overt pulmonary edema, confluent airspace opacity, pneumothorax or significant pleural effusion identified. The bones appear unremarkable. Telemetry leads overlie the chest. IMPRESSION: Stable radiographic appearance of the chest. Chronic cardiomegaly and mild vascular congestion. Electronically Signed   By: Richardean Sale M.D.   On: 04/17/2021 11:22    Cardiac Studies   Echo 04/15/21:  1. Left ventricular ejection fraction, by estimation, is 40 to 45%. The  left ventricle has mildly decreased function. The left ventricle  demonstrates global hypokinesis. The left ventricular internal cavity size  was moderately dilated. Left ventricular  diastolic parameters are indeterminate. The average left ventricular  global longitudinal strain is -6.0 %. The global longitudinal strain is  abnormal.   2. Right ventricular systolic function is normal. The right ventricular  size is normal.   3. Shadowing in dome of LA cannot r/o calcified thrombus . Left atrial  size was moderately dilated.   4. The mitral valve is abnormal. Mild mitral valve regurgitation. No  evidence of mitral stenosis.   5. The aortic valve is tricuspid. There is mild calcification of the  aortic valve. There is mild thickening of the aortic valve. Aortic valve  regurgitation is mild. Aortic valve sclerosis is present, with no evidence  of aortic valve stenosis.   6. The inferior vena cava is normal in size with greater than 50%  respiratory variability, suggesting right atrial pressure of 3 mmHg.   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  His volume status is much better and he is looking euvolemic.  REDS Vest yesterday was 33%, which is ideal.  Renal function is now trending in the right direction.  He will resume his home torsemide 40 mg twice daily this evening.  Carvedilol was reduced due to bradycardia now that he is back in sinus rhythm.  He is on his home dose of hydralazine and isosorbide.  No ARB/Entresto due to renal dysfunction.  We will add doxazosin 2mg .  BP goal <130/80.  #Atrial flutter: Underwent cardioversion this morning.  Now maintaining sinus rhythm in the 60s.  Continue Eliquis.  Carvedilol was reduced as above.  #CKD 4: Renal function back to baseline.  Resume torsemide today.  [facility] will sign off.   Medication Recommendations: Adding doxazosin.  Resume torsemide today. Other recommendations (labs, testing, etc): BMP in 1 week Follow up as an outpatient: We will arrange heart failure clinic follow-up.      For questions or updates, please contact [place] Please consult [email or web address] for contact info under        Signed, Skeet Latch, MD  04/18/2021, 9:06 AM

## 2021-04-22 ENCOUNTER — Telehealth: Payer: Self-pay

## 2021-04-22 NOTE — Telephone Encounter (Signed)
Transition Care Management Follow-up Telephone Call Date of discharge and from where: Phillip [name] 04-18-21 Dx: CHF How have you been since you were released from the hospital? good Any questions or concerns? No  Items Reviewed: Did the pt receive and understand the discharge instructions provided? Yes  Medications obtained and verified? Yes  Other? No  Any new allergies since your discharge? No  Dietary orders reviewed? Yes Do you have support at home? Yes   Home Care and Equipment/Supplies: Were home health services ordered? no If so, what is the name of the agency? na  Has the agency set up a time to come to the patient's home? not applicable Were any new equipment or medical supplies ordered?  No What is the name of the medical supply agency? na Were you able to get the supplies/equipment? not applicable Do you have any questions related to the use of the equipment or supplies? No  Functional Questionnaire: (I = Independent and D = Dependent) ADLs: I  Bathing/Dressing- I  Meal Prep- I  Eating- I  Maintaining continence- I  Transferring/Ambulation- I  Managing Meds- I  Follow up appointments reviewed:  PCP Hospital f/u appt confirmed? Yes  Scheduled to see Dr Phillip [name] on 05-07-21 @ [facility] f/u appt confirmed? Yes  Scheduled to see Dr Phillip [name] on 04-29-21 @ 930am. Are transportation arrangements needed? No  If their condition worsens, is the pt aware to call PCP or go to the Emergency Dept.? Yes Was the patient provided with contact information for the PCP's office or ED? Yes Was to pt encouraged to call back with questions or concerns? Yes

## 2021-04-22 NOTE — Telephone Encounter (Signed)
I did speak to him in the hospital and he was feeling better. It will be good for him to see both cardiology and me in the next 2 weeks or so

## 2021-04-26 NOTE — Progress Notes (Signed)
Advanced Heart Failure Clinic Note   Primary Care: [place] Primary Cardiologist: Dr. [name]  EP: Dr Allred Nephrology: Dr. Candiss Norse HF MD: Dr Haroldine Laws   HPI: Phillip [name] is a 61 y.o. male hx of PAF, remote PE, hx of DVT, OSA, morbid obesity, CKD Stage III,  HTN, and  combined chronic systolic/diastolic. S/P A Flutter ablation May 2019 .     EF in 2015 40-45% Echo 12/21/16 with EF 55-60% G2DD, mild aortic regurgitation.  Last nuc 2008 no ischemia, no hx of cath due to CKD.    Developed AFL and underwent AFL ablation in 5/19.  Dr. Haroldine Laws saw him 10/11/18 and was feeling bad. More SOB. Having some pressure in his chest and arm. + LE edema. Found to be in AFL/AF in 80s. Echo at that time with EF back down to 30%. Underwent DC-CV on 10/14/18 and referred to AF Clinic.   Developed recurrent AF at the end of January 2021. Underwent attempt at [facility] on 04/22/19 but failed to convert. Seen back in AF Clinic on 2/5. Not able to use Tikosyn due to long QT. Not ablation candidate due to obesity. Considered amiodarone. Started on flecainide but  had treadmill to monitor Flecainide Tx.   Patient had significant QRS widening within first minute of exercise.  Flecainide stopped  Recurrent AF in 9/22. Admitted. Had DC-CV. Amiodarone started.   Echo 11/20 EF 45-50%  Echo 5/21 EF 45-50%  Echo 9/22 EF 45-50% Marked LAE   Follow up 12/22 feeling well. No further AF on amiodarone.  Admitted 1/23 with a/c CHF and afib RVR. Cards consulted. Echo showed EF 40-45%, global LV HK, RV ok. He was diuresed with IV lasix and underwent DCCV to SB. Hospitalization c/b rising SCr. Carvedilol was reduced to 12.5 bid due to SB after DCCV and doxazosin added for BP goal < 130/80. No ARB/ARNi with CKD IV. He was discharged on all other home meds, weight 314 lbs.  Today he returns for HF follow up. Overall feeling fine. He feels much better now that he is back in rhythm. Denies abnormal bleeding, palpitations, CP, dizziness,  edema, or PND/Orthopnea. Appetite ok. No fever or chills. Weight at home 309 pounds. Taking all medications. Drinking a lot of water during the day and ate a high-salt meal this weekend out at a restaurant.  Cardiac Studies: - Last nuc 2008 no ischemia  - Echo 2015 EF 40-45%  - Echo 12/21/16 with EF 55-60% G2DD, mild aortic regurgitation.    - Echo 7/20 EF 30%  - Echo 11/20 EF 45-50%  - Echo 5/21 EF 45-50%  - Echo 9/22 EF 45-50% Marked LAE   - Echo 1/23 EF 40-45%, global LV HK, RV ok.   Review of systems complete and found to be negative unless listed in HPI.    Past Medical History:  Diagnosis Date   Asthma    CHF (congestive heart failure) ([place]) 11/15   EF 40-45%, improved with follow-up   Chronic kidney disease    COPD (chronic obstructive pulmonary disease) (HCC)    Deep vein thrombosis (DVT) of lower extremity ([place]) 08/17/2015   DVT (deep venous thrombosis) (HCC)    Gout    Hypertension    Hypertensive cardiovascular disease    Kidney dysfunction    Morbid obesity (HCC)    OSA on CPAP    Paroxysmal atrial fibrillation (HCC)    PNA (pneumonia)    Pulmonary embolism ([place]) 2010   left leg DVT   Sleep apnea  uses cpap   Current Outpatient Medications  Medication Sig Dispense Refill   albuterol (PROAIR HFA) 108 (90 Base) MCG/ACT inhaler Inhale 2 puffs into the lungs every 4 (four) hours as needed for wheezing or shortness of breath. (Patient taking differently: Inhale 2 puffs into the lungs every 4 (four) hours as needed for shortness of breath.) 1 Inhaler 3   albuterol (PROVENTIL) (2.5 MG/3ML) 0.083% nebulizer solution INHALE 1 VIAL VIA NEBULIZER EVERY 6 HOURS AS NEEDED FOR WHEEZING/SHORTNESS OF BREATH. (Patient taking differently: Take 2.5 mg by nebulization every 6 (six) hours as needed for shortness of breath.) 375 mL 11   allopurinol (ZYLOPRIM) 300 MG tablet TAKE 1 TABLET BY MOUTH EVERY DAY 30 tablet 11   amiodarone (PACERONE) 200 MG tablet Take 1 tablet (200 mg  total) by mouth daily. 90 tablet 3   budesonide-formoterol (SYMBICORT) 80-4.5 MCG/ACT inhaler Inhale 2 puffs into the lungs 2 (two) times daily. 3 each 3   carvedilol (COREG) 12.5 MG tablet Take 1 tablet (12.5 mg total) by mouth 2 (two) times daily with a meal. 60 tablet 0   Cholecalciferol (VITAMIN D3) 50 MCG (2000 UT) TABS Take 2,000 Units by mouth daily.     colchicine 0.6 MG tablet Take 0.6 mg by mouth daily as needed (gout flares).      doxazosin (CARDURA) 2 MG tablet Take 1 tablet (2 mg total) by mouth daily. 30 tablet 0   hydrALAZINE (APRESOLINE) 100 MG tablet Take 1 tablet (100 mg total) by mouth every 8 (eight) hours. 90 tablet 0   isosorbide mononitrate (IMDUR) 120 MG 24 hr tablet Take 1 tablet (120 mg total) by mouth daily. 90 tablet 3   KLOR-CON M20 20 MEQ tablet TAKE 2 TABLETS (40 MEQ) EVERY MORNING AND 1 TABLET (20 MEQ) EVERY EVENING 270 tablet 3   naproxen sodium (ALEVE) 220 MG tablet Take 440 mg by mouth daily as needed (pain/headache).     oxymetazoline (AFRIN NASAL SPRAY) 0.05 % nasal spray Place 1 spray into both nostrils 2 (two) times daily as needed for congestion.     Probiotic Product (PROBIOTIC DAILY PO) Take 1 capsule by mouth daily.     torsemide (DEMADEX) 20 MG tablet Take 2 tablets (40 mg total) by mouth 2 (two) times daily. 360 tablet 3   UNABLE TO FIND Med Name: CPAP with sleep     XARELTO 20 MG TABS tablet TAKE 1 TABLET DAILY WITH SUPPER (NEEDS APPOINTMENT FOR FURTHER REFILLS) 90 tablet 3   No current facility-administered medications for this encounter.   Allergies  Allergen Reactions   Other Anaphylaxis    mushrooms    Social History   Socioeconomic History   Marital status: Divorced    Spouse name: Not on file   Number of children: 2   Years of education: 12   Highest education level: Not on file  Occupational History   Occupation: Drives dump truck    Comment:     Occupation: Airline pilot    Comment: Disabled  Tobacco Use   Smoking status:  Former    Packs/day: 0.25    Years: 1.00    Pack years: 0.25    Types: Cigarettes    Quit date: 07/23/1989    Years since quitting: 31.7   Smokeless tobacco: Never   Tobacco comments:    Former smoker 03/06/2021  Vaping Use   Vaping Use: Never used  Substance and Sexual Activity   Alcohol use: No    Alcohol/week: 0.0 standard drinks  Drug use: No   Sexual activity: Not Currently  Other Topics Concern   Not on file  Social History Narrative   Pt lives in [place], alone.   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   Has 2 sons in [place]      No living will   Would want sons to make decisions for him   Would accept resuscitation   Social Determinants of Health   Financial Resource Strain: Not on file  Food Insecurity: Not on file  Transportation Needs: Not on file  Physical Activity: Not on file  Stress: Not on file  Social Connections: Not on file  Intimate Partner Violence: Not on file    Family History  Problem Relation Age of Onset   Hypertension Mother    Diabetes Father    CAD Father    Clotting disorder Father    Heart disease Father        transplant   Stomach cancer Father    Allergies Brother    Colon cancer Neg Hx    Colon polyps Neg Hx    Esophageal cancer Neg Hx    Rectal cancer Neg Hx    BP (!) 142/70    Pulse 62    Wt (!) 146.4 kg (322 lb 12.8 oz)    SpO2 98%    BMI 47.67 kg/m   Wt Readings from Last 3 Encounters:  04/29/21 (!) 146.4 kg (322 lb 12.8 oz)  04/18/21 (!) 142.7 kg (314 lb 8 oz)  03/28/21 (!) 144.7 kg (319 lb)   PHYSICAL EXAM: General:  NAD. No resp difficulty HEENT: Normal Neck: Supple. JVP 7-8. Carotids 2+ bilat; no bruits. No lymphadenopathy or thryomegaly appreciated. Cor: PMI nondisplaced. Regular rate & rhythm. No rubs, gallops or murmurs. Lungs: Clear Abdomen: Obese,  nontender, nondistended. No hepatosplenomegaly. No bruits or masses. Good bowel sounds. Extremities: No cyanosis, clubbing,  rash, trace BLE edema Neuro: Alert & oriented x 3, cranial nerves grossly intact. Moves all 4 extremities w/o difficulty. Affect pleasant.  ECG SB 54 bpm Personally reviewed  ASSESSMENT & PLAN: 1. Chronic systolic/diastolic HF - Echo 05/6466 LVEF 55-60%, Grade 2 DD. - EF 7/20 back down to 30%. Suspect due to AF but rate not overly elevated. HTN may also be playing a role. - Symptoms improved after DC-CV on 10/11/18  - Echo 11/20 EF 45-50% - Echo 5/21 EF 45-50% - Echo 9/22 EF 45-50% - Echo 1/23 EF 40-45%, global LV HK, RV ok. - Doing well now that he is back in NSR - Stable NYHA II. Volume status elevated, weight up 8 lbs. - Increase torsemide to 80 mg bid x 3 days then back to 40 mg bid.  - Continue carvedilol 12.5 mg bid. - Continue hydralazine 100 mg tid and Imdur 120 mg daily - Off Farxiga due to AKI and wants to stay off due to cost as well. - Has not had cath due to CKD and lack of clear anginal symptoms.  - No ACE/ARB with CKD (creatinine has run 2.5-3.3) - Discussed limiting salt and fluids - BMET/BNP today, repeat BMET in 10 days.  2. PAF / Typical A-flutter, paroxysmal - S/P AFL ablation 07/2017. - s/p DC-CV of AFib on 10/14/18 and 05/10/19 and 9/22 - Amio started 9/22 - Remains in NSR on ECG today. - Continue Xarelto 20 mg daily. No bleeding - He is compliant with CPAP. Needs further weight loss - Has been seen in AF  Clinic and felt not to be candidate for RFA due to size and LAE.  - Given young age woul like to avoid long-term exposure to [facility]. Discussed with Dr. Haroldine Laws, will refer to Dr. Oneida Alar for repeat high-risk ablation consideration.  3. Hypertension - Elevated today.   - Diurese as above.  4. Morbid obesity - Body mass index is 47.67 kg/m.  - Continue weight loss efforts - Consider possible GLP-1RA for weight loss.  5. CKD IIIb - Creatinine baseline ~2.5 -3.0  - Follows with Dr. Candiss Norse.  - BMET today.  6. OSA on CPAP - Compliant with CPAP.  -  Continue with weight loss.  Follow up with Dr. Haroldine Laws in 3 months.  Rafael Bihari, FNP  9:45 AM

## 2021-04-29 ENCOUNTER — Ambulatory Visit (HOSPITAL_COMMUNITY)
Admit: 2021-04-29 | Discharge: 2021-04-29 | Disposition: A | Discharge status: Home / Self Care | Payer: Managed Care, Other (non HMO) | Attending: Family Medicine | Admitting: Family Medicine

## 2021-04-29 ENCOUNTER — Other Ambulatory Visit: Payer: Self-pay

## 2021-04-29 ENCOUNTER — Encounter (HOSPITAL_COMMUNITY): Payer: Self-pay

## 2021-04-29 VITALS — BP 142/70 | HR 62 | Wt 322.8 lb

## 2021-04-29 DIAGNOSIS — I483 Typical atrial flutter: Secondary | ICD-10-CM | POA: Insufficient documentation

## 2021-04-29 DIAGNOSIS — I480 Paroxysmal atrial fibrillation: Secondary | ICD-10-CM

## 2021-04-29 DIAGNOSIS — I130 Hypertensive heart and chronic kidney disease with heart failure and stage 1 through stage 4 chronic kidney disease, or unspecified chronic kidney disease: Secondary | ICD-10-CM | POA: Diagnosis not present

## 2021-04-29 DIAGNOSIS — Z6841 Body Mass Index (BMI) 40.0 and over, adult: Secondary | ICD-10-CM | POA: Diagnosis not present

## 2021-04-29 DIAGNOSIS — Z7901 Long term (current) use of anticoagulants: Secondary | ICD-10-CM | POA: Insufficient documentation

## 2021-04-29 DIAGNOSIS — Z9989 Dependence on other enabling machines and devices: Secondary | ICD-10-CM | POA: Diagnosis not present

## 2021-04-29 DIAGNOSIS — Z86718 Personal history of other venous thrombosis and embolism: Secondary | ICD-10-CM | POA: Insufficient documentation

## 2021-04-29 DIAGNOSIS — I5042 Chronic combined systolic (congestive) and diastolic (congestive) heart failure: Secondary | ICD-10-CM | POA: Diagnosis not present

## 2021-04-29 DIAGNOSIS — N179 Acute kidney failure, unspecified: Secondary | ICD-10-CM | POA: Diagnosis not present

## 2021-04-29 DIAGNOSIS — Z79899 Other long term (current) drug therapy: Secondary | ICD-10-CM | POA: Insufficient documentation

## 2021-04-29 DIAGNOSIS — N1832 Chronic kidney disease, stage 3b: Secondary | ICD-10-CM | POA: Insufficient documentation

## 2021-04-29 DIAGNOSIS — I10 Essential (primary) hypertension: Secondary | ICD-10-CM | POA: Diagnosis not present

## 2021-04-29 DIAGNOSIS — G4733 Obstructive sleep apnea (adult) (pediatric): Secondary | ICD-10-CM | POA: Diagnosis not present

## 2021-04-29 DIAGNOSIS — I5032 Chronic diastolic (congestive) heart failure: Secondary | ICD-10-CM | POA: Diagnosis not present

## 2021-04-29 DIAGNOSIS — Z86711 Personal history of pulmonary embolism: Secondary | ICD-10-CM | POA: Insufficient documentation

## 2021-04-29 DIAGNOSIS — R001 Bradycardia, unspecified: Secondary | ICD-10-CM | POA: Diagnosis not present

## 2021-04-29 DIAGNOSIS — N183 Chronic kidney disease, stage 3 unspecified: Secondary | ICD-10-CM

## 2021-04-29 LAB — BASIC METABOLIC PANEL
Anion gap: 8 (ref 5–15)
BUN: 31 mg/dL — ABNORMAL HIGH (ref 6–20)
CO2: 28 mmol/L (ref 22–32)
Calcium: 8.8 mg/dL — ABNORMAL LOW (ref 8.9–10.3)
Chloride: 105 mmol/L (ref 98–111)
Creatinine, Ser: 2.93 mg/dL — ABNORMAL HIGH (ref 0.61–1.24)
GFR, Estimated: 24 mL/min — ABNORMAL LOW (ref >60)
Glucose, Bld: 88 mg/dL (ref 70–99)
Potassium: 4.2 mmol/L (ref 3.5–5.1)
Sodium: 141 mmol/L (ref 135–145)

## 2021-04-29 LAB — BRAIN NATRIURETIC PEPTIDE: B Natriuretic Peptide: 795 pg/mL — ABNORMAL HIGH (ref 0.0–100.0)

## 2021-04-29 NOTE — Addendum Note (Signed)
Encounter addended by: Rafael Bihari, FNP on: 04/29/2021 12:45 PM  Actions taken: Level of Service modified

## 2021-04-29 NOTE — Patient Instructions (Addendum)
Thank you for coming in today  Labs were done today, if any labs are abnormal the clinic will call you  INCREASE Torsemide to 80 mg 4 tablets twice daily for 3 days then go back to 40 mg 2 tablets twice daily   Your physician recommends that you return for lab work in: 10-14 for BMET labs  Your physician recommends that you schedule a follow-up appointment in: 3 months  with Dr. Haroldine Laws  At the [facility], you and your health needs are our priority. As part of our continuing mission to provide you with exceptional heart care, we have created designated Provider Care Teams. These Care Teams include your primary Cardiologist (physician) and Advanced Practice Providers (APPs- Physician Assistants and Nurse Practitioners) who all work together to provide you with the care you need, when you need it.   You may see any of the following providers on your designated Care Team at your next follow up: Dr Glori Bickers Dr Haynes Kerns, NP Lyda Jester, [place] [facility] [place], [place] Audry Riles, PharmD   Please be sure to bring in all your medications bottles to every appointment.  If you have any questions or concerns before your next appointment please send [place] a message through [place] or call our office at [phone].    TO LEAVE A MESSAGE FOR THE NURSE SELECT OPTION 2, PLEASE LEAVE A MESSAGE INCLUDING: YOUR NAME DATE OF BIRTH CALL BACK NUMBER REASON FOR CALL**this is important as we prioritize the call backs  YOU WILL RECEIVE A CALL BACK THE SAME DAY AS LONG AS YOU CALL BEFORE 4:00 PM

## 2021-05-07 ENCOUNTER — Encounter: Payer: Self-pay | Admitting: Internal Medicine

## 2021-05-07 ENCOUNTER — Other Ambulatory Visit: Payer: Self-pay

## 2021-05-07 ENCOUNTER — Ambulatory Visit: Payer: Managed Care, Other (non HMO) | Admitting: Internal Medicine

## 2021-05-07 DIAGNOSIS — N184 Chronic kidney disease, stage 4 (severe): Secondary | ICD-10-CM | POA: Diagnosis not present

## 2021-05-07 DIAGNOSIS — J449 Chronic obstructive pulmonary disease, unspecified: Secondary | ICD-10-CM | POA: Diagnosis not present

## 2021-05-07 DIAGNOSIS — I5042 Chronic combined systolic (congestive) and diastolic (congestive) heart failure: Secondary | ICD-10-CM

## 2021-05-07 DIAGNOSIS — I480 Paroxysmal atrial fibrillation: Secondary | ICD-10-CM

## 2021-05-07 NOTE — Assessment & Plan Note (Signed)
Cardioverted and now on amiodarone 200mg  daily for now xarelto 20 Carvedilol 12.5 bid

## 2021-05-07 NOTE — Assessment & Plan Note (Signed)
Controlled on symbicort daily

## 2021-05-07 NOTE — Assessment & Plan Note (Signed)
No ACEI or ARB due to this GFR stable in 20's

## 2021-05-07 NOTE — Assessment & Plan Note (Addendum)
Borderline EF and known diastolic dysfunction Recent exacerbation was chronotropic--and now better since DC cardioversion back to sinus rhythm Continues torsemide 80/80 Isosorbide 120 daily, hydralazine 100 tid

## 2021-05-07 NOTE — Progress Notes (Signed)
Subjective:    Patient ID: Phillip [name], male    DOB: [birth year], 61 y.o.   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 (Patient taking differently: Inhale 2 puffs into the lungs every 4 (four) hours as needed for shortness of breath.) 1 Inhaler 3   albuterol (PROVENTIL) (2.5 MG/3ML) 0.083% nebulizer solution INHALE 1 VIAL VIA NEBULIZER EVERY 6 HOURS AS NEEDED FOR WHEEZING/SHORTNESS OF BREATH. (Patient taking differently: Take 2.5 mg by nebulization every 6 (six) hours as needed for shortness of breath.) 375 mL 11   allopurinol (ZYLOPRIM) 300 MG tablet TAKE 1 TABLET BY MOUTH EVERY DAY 30 tablet 11   amiodarone (PACERONE) 200 MG tablet Take 1 tablet (200 mg total) by mouth daily. 90 tablet 3   budesonide-formoterol (SYMBICORT) 80-4.5 MCG/ACT inhaler Inhale 2 puffs into the lungs 2 (two) times daily. 3 each 3   carvedilol (COREG) 12.5 MG tablet Take 1 tablet (12.5 mg total) by mouth 2 (two) times daily with a meal. 60 tablet 0   Cholecalciferol (VITAMIN D3) 50 MCG (2000 UT) TABS Take 2,000 Units by mouth daily.     colchicine 0.6 MG tablet Take 0.6 mg by mouth daily as needed (gout flares).      doxazosin (CARDURA) 2 MG tablet Take 1 tablet (2 mg total) by mouth daily. 30 tablet 0   hydrALAZINE (APRESOLINE) 100 MG tablet Take 1 tablet  (100 mg total) by mouth every 8 (eight) hours. 90 tablet 0   isosorbide mononitrate (IMDUR) 120 MG 24 hr tablet Take 1 tablet (120 mg total) by mouth daily. 90 tablet 3   KLOR-CON M20 20 MEQ tablet TAKE 2 TABLETS (40 MEQ) EVERY MORNING AND 1 TABLET (20 MEQ) EVERY EVENING 270 tablet 3   naproxen sodium (ALEVE) 220 MG tablet Take 440 mg by mouth daily as needed (pain/headache).     oxymetazoline (AFRIN NASAL SPRAY) 0.05 % nasal spray Place 1 spray into both nostrils 2 (two) times daily as needed for congestion.     Probiotic Product (PROBIOTIC DAILY PO) Take 1 capsule by mouth daily.     torsemide (DEMADEX) 20 MG tablet Take 2 tablets (40 mg total) by mouth 2 (two) times daily. 360 tablet 3   UNABLE TO FIND Med Name: CPAP with sleep     XARELTO 20 MG TABS tablet TAKE 1 TABLET DAILY WITH SUPPER (NEEDS APPOINTMENT FOR FURTHER REFILLS) 90 tablet 3   No current facility-administered medications on file prior to visit.    Allergies  Allergen Reactions   Other Anaphylaxis    mushrooms    Past Medical History:  Diagnosis Date   Asthma    CHF (congestive heart failure) ([place]) 11/15   EF 40-45%, improved with follow-up   Chronic kidney disease    COPD (chronic  obstructive pulmonary disease) (HCC)    Deep vein thrombosis (DVT) of lower extremity ([place]) 08/17/2015   DVT (deep venous thrombosis) (HCC)    Gout    Hypertension    Hypertensive cardiovascular disease    Kidney dysfunction    Morbid obesity (HCC)    OSA on CPAP    Paroxysmal atrial fibrillation (HCC)    PNA (pneumonia)    Pulmonary embolism ([place]) 2010   left leg DVT   Sleep apnea    uses cpap    Past Surgical History:  Procedure Laterality Date   A-FLUTTER ABLATION N/A 07/22/2017   Procedure: A-FLUTTER ABLATION;  Surgeon: Thompson Grayer, MD;  Location: [facility];  Service: Cardiovascular;  Laterality: N/A;   CARDIOVERSION N/A 10/14/2018   Procedure: CARDIOVERSION;  Surgeon: Jolaine Artist, MD;  Location: [facility];  Service: Cardiovascular;  Laterality: N/A;   CARDIOVERSION N/A 04/22/2019   Procedure: CARDIOVERSION;  Surgeon: Thayer Headings, MD;  Location: [facility];  Service: Cardiovascular;  Laterality: N/A;   CARDIOVERSION N/A 05/10/2019   Procedure: CARDIOVERSION;  Surgeon: Jolaine Artist, MD;  Location: [facility];  Service: Cardiovascular;  Laterality: N/A;   CARDIOVERSION N/A 11/23/2020   Procedure: CARDIOVERSION;  Surgeon: Pixie Casino, MD;  Location: [facility];  Service: Cardiovascular;  Laterality: N/A;   CARDIOVERSION N/A 04/16/2021   Procedure: CARDIOVERSION;  Surgeon: Berniece Salines, DO;  Location: [facility];  Service: Cardiovascular;  Laterality: N/A;   COLONOSCOPY WITH PROPOFOL N/A 12/20/2019   Procedure: COLONOSCOPY WITH PROPOFOL;  Surgeon: Yetta Flock, MD;  Location: [facility];  Service: Gastroenterology;  Laterality: N/A;   POLYPECTOMY  12/20/2019   Procedure: POLYPECTOMY;  Surgeon: Yetta Flock, MD;  Location: [facility];  Service: Gastroenterology;;   UMBILICAL HERNIA REPAIR  1992    Family History  Problem Relation Age of Onset   Hypertension Mother    Diabetes Father    CAD Father    Clotting disorder Father    Heart disease Father        transplant   Stomach cancer Father    Allergies Brother    Colon cancer Neg Hx    Colon polyps Neg Hx    Esophageal cancer Neg Hx    Rectal cancer Neg Hx     Social History   Socioeconomic History   Marital status: Divorced    Spouse name: Not on file   Number of children: 2   Years of education: 12   Highest education level: Not on file  Occupational History   Occupation: Drives dump truck    Comment:     Occupation: Airline pilot    Comment: Disabled  Tobacco Use   Smoking status: Former    Packs/day: 0.25    Years: 1.00    Pack years: 0.25    Types: Cigarettes    Quit date: 07/23/1989    Years since quitting: 31.8   Smokeless tobacco: Never   Tobacco comments:    Former  smoker 03/06/2021  Vaping Use   Vaping Use: Never used  Substance and Sexual Activity   Alcohol use: No    Alcohol/week: 0.0 standard drinks   Drug use: No   Sexual activity: Not Currently  Other Topics Concern   Not on file  Social History Narrative   Pt lives in [place], alone.   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   Has 2 sons in [place]  No living will   Would want sons to make decisions for him   Would accept resuscitation   Social Determinants of Health   Financial Resource Strain: Not on file  Food Insecurity: Not on file  Transportation Needs: Not on file  Physical Activity: Not on file  Stress: Not on file  Social Connections: Not on file  Intimate Partner Violence: Not on file   Review of Systems Sleeps well with CPAP nightly Eating healthy---baked chicken, veggies, etc     Objective:   Physical Exam Constitutional:      Appearance: Normal appearance.  Cardiovascular:     Rate and Rhythm: Normal rate and regular rhythm.     Pulses: Normal pulses.     Heart sounds: No murmur heard.   No gallop.     Comments: Rare extra beats Pulmonary:     Effort: Pulmonary effort is normal.     Breath sounds: Normal breath sounds. No wheezing or rales.  Musculoskeletal:     Right lower leg: No edema.     Left lower leg: No edema.  Neurological:     Mental Status: He is alert.  Psychiatric:        Mood and Affect: Mood normal.        Behavior: Behavior normal.           Assessment & Plan:

## 2021-05-08 ENCOUNTER — Encounter (HOSPITAL_COMMUNITY): Payer: Managed Care, Other (non HMO) | Admitting: Internal Medicine

## 2021-05-09 ENCOUNTER — Ambulatory Visit (HOSPITAL_COMMUNITY)
Admission: RE | Admit: 2021-05-09 | Discharge: 2021-05-09 | Disposition: A | Discharge status: Home / Self Care | Payer: Managed Care, Other (non HMO) | Source: Ambulatory Visit | Attending: Internal Medicine | Admitting: Internal Medicine

## 2021-05-09 ENCOUNTER — Other Ambulatory Visit: Payer: Self-pay

## 2021-05-09 DIAGNOSIS — I5032 Chronic diastolic (congestive) heart failure: Secondary | ICD-10-CM | POA: Insufficient documentation

## 2021-05-09 LAB — BASIC METABOLIC PANEL
Anion gap: 7 (ref 5–15)
BUN: 30 mg/dL — ABNORMAL HIGH (ref 6–20)
CO2: 29 mmol/L (ref 22–32)
Calcium: 9.1 mg/dL (ref 8.9–10.3)
Chloride: 104 mmol/L (ref 98–111)
Creatinine, Ser: 2.99 mg/dL — ABNORMAL HIGH (ref 0.61–1.24)
GFR, Estimated: 23 mL/min — ABNORMAL LOW (ref >60)
Glucose, Bld: 95 mg/dL (ref 70–99)
Potassium: 4.2 mmol/L (ref 3.5–5.1)
Sodium: 140 mmol/L (ref 135–145)

## 2021-05-20 ENCOUNTER — Telehealth (HOSPITAL_COMMUNITY): Payer: Self-pay | Admitting: Vascular Surgery

## 2021-05-20 NOTE — Telephone Encounter (Signed)
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 He will like DB to order please advise

## 2021-05-23 NOTE — Telephone Encounter (Signed)
Received faxed from [facility], [employer] Cardiovascular form. Form completed, signed by Dr Haroldine Laws, and faxed back to [phone] w/records. Left VM for pt this has been done ?

## 2021-06-04 ENCOUNTER — Encounter: Payer: Managed Care, Other (non HMO) | Admitting: Internal Medicine

## 2021-06-13 ENCOUNTER — Other Ambulatory Visit: Payer: Self-pay

## 2021-06-13 MED ORDER — ALLOPURINOL 300 MG PO TABS: 300.0000 mg | ORAL_TABLET | Freq: Every day | ORAL | 0 refills | Status: DC

## 2021-06-13 MED ORDER — CARVEDILOL 12.5 MG PO TABS
12.5000 mg | ORAL_TABLET | Freq: Two times a day (BID) | ORAL | 0 refills | Status: DC
Start: 2021-06-13 — End: 2021-08-01

## 2021-06-21 ENCOUNTER — Other Ambulatory Visit (HOSPITAL_COMMUNITY): Payer: Self-pay

## 2021-06-21 ENCOUNTER — Telehealth (HOSPITAL_COMMUNITY): Payer: Self-pay | Admitting: Pharmacy Technician

## 2021-06-21 NOTE — Telephone Encounter (Signed)
Patient Advocate Encounter ?  ?Received notification from [employer] that prior authorization for Xarelto is required. ?  ?PA submitted via fax, [phone] ?Case ID [id number] ?Status is pending ?  ?Will continue to follow. ? ?

## 2021-06-24 NOTE — Telephone Encounter (Signed)
Advanced Heart Failure Patient Advocate Encounter ? ?Prior Authorization for Xarelto has been approved.   ? ?PA# [id number] ?Effective dates: 05/22/21 through 06/21/22 ? ?Charlann Boxer, CPhT ? ?

## 2021-07-26 ENCOUNTER — Encounter: Payer: Self-pay | Admitting: Oncology

## 2021-07-26 NOTE — Progress Notes (Signed)
Patient contacted via phone for NP visit. Chart updated and meds reviewed.  ?

## 2021-07-29 ENCOUNTER — Encounter: Payer: Self-pay | Admitting: Oncology

## 2021-07-29 ENCOUNTER — Inpatient Hospital Stay: Payer: Managed Care, Other (non HMO)

## 2021-07-29 ENCOUNTER — Inpatient Hospital Stay: Payer: Managed Care, Other (non HMO) | Attending: Oncology | Admitting: Oncology

## 2021-07-29 VITALS — BP 187/67 | HR 62 | Temp 96.7°F | Ht 69.0 in | Wt 315.0 lb

## 2021-07-29 DIAGNOSIS — Z832 Family history of diseases of the blood and blood-forming organs and certain disorders involving the immune mechanism: Secondary | ICD-10-CM | POA: Insufficient documentation

## 2021-07-29 DIAGNOSIS — D72819 Decreased white blood cell count, unspecified: Secondary | ICD-10-CM | POA: Insufficient documentation

## 2021-07-29 DIAGNOSIS — J449 Chronic obstructive pulmonary disease, unspecified: Secondary | ICD-10-CM | POA: Insufficient documentation

## 2021-07-29 DIAGNOSIS — Z800 Family history of malignant neoplasm of digestive organs: Secondary | ICD-10-CM | POA: Insufficient documentation

## 2021-07-29 DIAGNOSIS — E538 Deficiency of other specified B group vitamins: Secondary | ICD-10-CM | POA: Diagnosis not present

## 2021-07-29 DIAGNOSIS — D72818 Other decreased white blood cell count: Secondary | ICD-10-CM

## 2021-07-29 DIAGNOSIS — I509 Heart failure, unspecified: Secondary | ICD-10-CM | POA: Insufficient documentation

## 2021-07-29 DIAGNOSIS — R5383 Other fatigue: Secondary | ICD-10-CM | POA: Insufficient documentation

## 2021-07-29 DIAGNOSIS — Z79899 Other long term (current) drug therapy: Secondary | ICD-10-CM | POA: Diagnosis not present

## 2021-07-29 DIAGNOSIS — D631 Anemia in chronic kidney disease: Secondary | ICD-10-CM

## 2021-07-29 DIAGNOSIS — Z86711 Personal history of pulmonary embolism: Secondary | ICD-10-CM | POA: Insufficient documentation

## 2021-07-29 DIAGNOSIS — D696 Thrombocytopenia, unspecified: Secondary | ICD-10-CM

## 2021-07-29 DIAGNOSIS — N184 Chronic kidney disease, stage 4 (severe): Secondary | ICD-10-CM | POA: Diagnosis not present

## 2021-07-29 DIAGNOSIS — Z833 Family history of diabetes mellitus: Secondary | ICD-10-CM | POA: Diagnosis not present

## 2021-07-29 DIAGNOSIS — Z86718 Personal history of other venous thrombosis and embolism: Secondary | ICD-10-CM | POA: Diagnosis not present

## 2021-07-29 DIAGNOSIS — G4733 Obstructive sleep apnea (adult) (pediatric): Secondary | ICD-10-CM | POA: Insufficient documentation

## 2021-07-29 DIAGNOSIS — K5900 Constipation, unspecified: Secondary | ICD-10-CM | POA: Insufficient documentation

## 2021-07-29 DIAGNOSIS — I130 Hypertensive heart and chronic kidney disease with heart failure and stage 1 through stage 4 chronic kidney disease, or unspecified chronic kidney disease: Secondary | ICD-10-CM | POA: Insufficient documentation

## 2021-07-29 DIAGNOSIS — Z7901 Long term (current) use of anticoagulants: Secondary | ICD-10-CM | POA: Diagnosis not present

## 2021-07-29 DIAGNOSIS — Z87891 Personal history of nicotine dependence: Secondary | ICD-10-CM | POA: Diagnosis not present

## 2021-07-29 DIAGNOSIS — Z8249 Family history of ischemic heart disease and other diseases of the circulatory system: Secondary | ICD-10-CM | POA: Insufficient documentation

## 2021-07-29 LAB — VITAMIN B12: Vitamin B-12: 166 pg/mL — ABNORMAL LOW (ref 180–914)

## 2021-07-29 LAB — CBC WITH DIFFERENTIAL/PLATELET
Abs Immature Granulocytes: 0 10*3/uL (ref 0.00–0.07)
Basophils Absolute: 0 10*3/uL (ref 0.0–0.1)
Basophils Relative: 1 %
Eosinophils Absolute: 0 10*3/uL (ref 0.0–0.5)
Eosinophils Relative: 2 %
HCT: 33.5 % — ABNORMAL LOW (ref 39.0–52.0)
Hemoglobin: 10.3 g/dL — ABNORMAL LOW (ref 13.0–17.0)
Immature Granulocytes: 0 %
Lymphocytes Relative: 28 %
Lymphs Abs: 0.7 10*3/uL (ref 0.7–4.0)
MCH: 28.1 pg (ref 26.0–34.0)
MCHC: 30.7 g/dL (ref 30.0–36.0)
MCV: 91.5 fL (ref 80.0–100.0)
Monocytes Absolute: 0.3 10*3/uL (ref 0.1–1.0)
Monocytes Relative: 13 %
Neutro Abs: 1.5 10*3/uL — ABNORMAL LOW (ref 1.7–7.7)
Neutrophils Relative %: 56 %
Platelets: 119 10*3/uL — ABNORMAL LOW (ref 150–400)
RBC: 3.66 MIL/uL — ABNORMAL LOW (ref 4.22–5.81)
RDW: 16.8 % — ABNORMAL HIGH (ref 11.5–15.5)
WBC: 2.6 10*3/uL — ABNORMAL LOW (ref 4.0–10.5)
nRBC: 0 % (ref 0.0–0.2)

## 2021-07-29 LAB — FERRITIN: Ferritin: 26 ng/mL (ref 24–336)

## 2021-07-29 LAB — TECHNOLOGIST SMEAR REVIEW
Plt Morphology: NORMAL
RBC MORPHOLOGY: NORMAL
WBC MORPHOLOGY: NORMAL

## 2021-07-29 LAB — HEPATITIS PANEL, ACUTE
HCV Ab: NONREACTIVE
Hep A IgM: NONREACTIVE
Hep B C IgM: NONREACTIVE
Hepatitis B Surface Ag: NONREACTIVE

## 2021-07-29 LAB — COMPREHENSIVE METABOLIC PANEL
ALT: 19 U/L (ref 0–44)
AST: 18 U/L (ref 15–41)
Albumin: 3.5 g/dL (ref 3.5–5.0)
Alkaline Phosphatase: 57 U/L (ref 38–126)
Anion gap: 5 (ref 5–15)
BUN: 39 mg/dL — ABNORMAL HIGH (ref 6–20)
CO2: 31 mmol/L (ref 22–32)
Calcium: 8.6 mg/dL — ABNORMAL LOW (ref 8.9–10.3)
Chloride: 105 mmol/L (ref 98–111)
Creatinine, Ser: 3.08 mg/dL — ABNORMAL HIGH (ref 0.61–1.24)
GFR, Estimated: 22 mL/min — ABNORMAL LOW (ref >60)
Glucose, Bld: 78 mg/dL (ref 70–99)
Potassium: 4 mmol/L (ref 3.5–5.1)
Sodium: 141 mmol/L (ref 135–145)
Total Bilirubin: 0.8 mg/dL (ref 0.3–1.2)
Total Protein: 5.9 g/dL — ABNORMAL LOW (ref 6.5–8.1)

## 2021-07-29 LAB — IRON AND TIBC
Iron: 78 ug/dL (ref 45–182)
Saturation Ratios: 25 % (ref 17.9–39.5)
TIBC: 314 ug/dL (ref 250–450)
UIBC: 236 ug/dL

## 2021-07-29 LAB — IMMATURE PLATELET FRACTION: Immature Platelet Fraction: 5.9 % (ref 1.2–8.6)

## 2021-07-29 LAB — RETIC PANEL
Immature Retic Fract: 10.1 % (ref 2.3–15.9)
RBC.: 3.68 MIL/uL — ABNORMAL LOW (ref 4.22–5.81)
Retic Count, Absolute: 43.4 10*3/uL (ref 19.0–186.0)
Retic Ct Pct: 1.2 % (ref 0.4–3.1)
Reticulocyte Hemoglobin: 27.4 pg — ABNORMAL LOW (ref >27.9)

## 2021-07-29 LAB — LACTATE DEHYDROGENASE: LDH: 193 U/L — ABNORMAL HIGH (ref 98–192)

## 2021-07-29 LAB — FOLATE: Folate: 12.2 ng/mL (ref >5.9)

## 2021-07-29 NOTE — Progress Notes (Signed)
?Hematology/Oncology Consult note ?[phone] [phone] ?  ? ?   ? ? ?Patient Care Team: ?[name], MD as PCP - General (Internal Medicine) ?Bensimhon, Shaune Pascal, MD as PCP - Advanced Heart Failure (Cardiology) ?Bensimhon, Shaune Pascal, MD as PCP - Cardiology (Cardiology) ? ?REFERRING PROVIDER: ?Murlean Iba, MD  ?CHIEF COMPLAINTS/REASON FOR VISIT:  ?Evaluation of anemia of chronic kidney disease ? ?HISTORY OF PRESENTING ILLNESS:  ? ?Phillip [name] is a  61 y.o.  male with PMH listed below was seen in consultation at the request of  Murlean Iba, MD  for evaluation of anemia of chronic kidney disease. ? ?Patient has chronic kidney disease and follows up with Dr. Candiss Norse.  Baseline creatinine 2.9. ?History of pulmonary embolism [2010], left lower extremity DVT[2017], atrial fibrillation, currently on Xarelto 20 mg daily.  Managed by cardiology/CHF clinic [facility].  Patient denies any melena, hematochezia. ? ?07/16/2021 CBC showed hemoglobin 10.4, MCV 91, platelet count 10 9000, white count 2.6. ?Patient denies alcohol use.  He has intentionally lost weight. ?Patient was referred to establish care with hematology for further evaluation. ? ?Review of Systems  ?Constitutional:  Positive for fatigue. Negative for appetite change, chills, fever and unexpected weight change.  ?HENT:   Negative for hearing loss and voice change.   ?Eyes:  Negative for eye problems and icterus.  ?Respiratory:  Negative for chest tightness, cough and shortness of breath.   ?Cardiovascular:  Negative for chest pain and leg swelling.  ?Gastrointestinal:  Negative for abdominal distention and abdominal pain.  ?Endocrine: Negative for hot flashes.  ?Genitourinary:  Negative for difficulty urinating, dysuria and frequency.   ?Musculoskeletal:  Negative for arthralgias.  ?Skin:  Negative for itching and rash.  ?Neurological:  Negative for light-headedness and numbness.  ?Hematological:  Negative for adenopathy. Does not  bruise/bleed easily.  ?Psychiatric/Behavioral:  Negative for confusion.   ? ?MEDICAL HISTORY:  ?Past Medical History:  ?Diagnosis Date  ? Asthma   ? CHF (congestive heart failure) ([place]) 11/15  ? EF 40-45%, improved with follow-up  ? Chronic kidney disease   ? COPD (chronic obstructive pulmonary disease) ([place])   ? Deep vein thrombosis (DVT) of lower extremity ([place]) 08/17/2015  ? DVT (deep venous thrombosis) ([place])   ? Gout   ? Hypertension   ? Hypertensive cardiovascular disease   ? Kidney dysfunction   ? Morbid obesity ([place])   ? OSA on CPAP   ? Paroxysmal atrial fibrillation (HCC)   ? PNA (pneumonia)   ? Pulmonary embolism ([place]) 2010  ? left leg DVT  ? Sleep apnea   ? uses cpap  ? ? ?SURGICAL HISTORY: ?Past Surgical History:  ?Procedure Laterality Date  ? A-FLUTTER ABLATION N/A 07/22/2017  ? Procedure: A-FLUTTER ABLATION;  Surgeon: Thompson Grayer, MD;  Location: [facility];  Service: Cardiovascular;  Laterality: N/A;  ? CARDIOVERSION N/A 10/14/2018  ? Procedure: CARDIOVERSION;  Surgeon: Jolaine Artist, MD;  Location: [facility];  Service: Cardiovascular;  Laterality: N/A;  ? CARDIOVERSION N/A 04/22/2019  ? Procedure: CARDIOVERSION;  Surgeon: Thayer Headings, MD;  Location: [facility];  Service: Cardiovascular;  Laterality: N/A;  ? CARDIOVERSION N/A 05/10/2019  ? Procedure: CARDIOVERSION;  Surgeon: Jolaine Artist, MD;  Location: [facility];  Service: Cardiovascular;  Laterality: N/A;  ? CARDIOVERSION N/A 11/23/2020  ? Procedure: CARDIOVERSION;  Surgeon: Pixie Casino, MD;  Location: [facility];  Service: Cardiovascular;  Laterality: N/A;  ? CARDIOVERSION N/A 04/16/2021  ? Procedure: CARDIOVERSION;  Surgeon: Berniece Salines,  DO;  Location: [facility];  Service: Cardiovascular;  Laterality: N/A;  ? COLONOSCOPY WITH PROPOFOL N/A 12/20/2019  ? Procedure: COLONOSCOPY WITH PROPOFOL;  Surgeon: Yetta Flock, MD;  [facility];  Service: Gastroenterology;  Laterality: N/A;  ? POLYPECTOMY   12/20/2019  ? Procedure: POLYPECTOMY;  Surgeon: Yetta Flock, MD;  Location: [facility];  Service: Gastroenterology;;  ? [facility]  ? ? ?SOCIAL HISTORY: ?Social History  ? ?Socioeconomic History  ? Marital status: Divorced  ?  Spouse name: Not on file  ? Number of children: 2  ? Years of education: 92  ? Highest education level: Not on file  ?Occupational History  ? Occupation: Drives dump truck  ?  Comment:    ? Occupation: Airline pilot  ?  Comment: Disabled  ?Tobacco Use  ? Smoking status: Former  ?  Packs/day: 0.25  ?  Years: 1.00  ?  Pack years: 0.25  ?  Types: Cigarettes  ?  Quit date: 07/23/1989  ?  Years since quitting: 32.0  ? Smokeless tobacco: Never  ? Tobacco comments:  ?  Former smoker 03/06/2021  ?Vaping Use  ? Vaping Use: Never used  ?Substance and Sexual Activity  ? Alcohol use: No  ?  Alcohol/week: 0.0 standard drinks  ? Drug use: No  ? Sexual activity: Not Currently  ?Other Topics Concern  ? Not on file  ?Social History Narrative  ? Pt lives in [place], alone.  ? 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 Truck driver now x 27 yrs.  ? Has 2 sons in [place]  ?   ? No living will  ? Would want sons to make decisions for him  ? Would accept resuscitation  ? ?Social Determinants of Health  ? ?Financial Resource Strain: Not on file  ?Food Insecurity: Not on file  ?Transportation Needs: Not on file  ?Physical Activity: Not on file  ?Stress: Not on file  ?Social Connections: Not on file  ?Intimate Partner Violence: Not on file  ? ? ?FAMILY HISTORY: ?Family History  ?Problem Relation Age of Onset  ? Hypertension Mother   ? Diabetes Father   ? CAD Father   ? Clotting disorder Father   ? Heart disease Father   ?     transplant  ? Stomach cancer Father   ? Allergies Brother   ? Colon cancer Neg Hx   ? Colon polyps Neg Hx   ? Esophageal cancer Neg Hx   ? Rectal cancer Neg Hx   ? ? ?ALLERGIES:  is allergic to other. ? ?MEDICATIONS:  ?Current Outpatient Medications  ?Medication Sig  Dispense Refill  ? albuterol (PROAIR HFA) 108 (90 Base) MCG/ACT inhaler Inhale 2 puffs into the lungs every 4 (four) hours as needed for wheezing or shortness of breath. (Patient taking differently: Inhale 2 puffs into the lungs every 4 (four) hours as needed for shortness of breath.) 1 Inhaler 3  ? albuterol (PROVENTIL) (2.5 MG/3ML) 0.083% nebulizer solution INHALE 1 VIAL VIA NEBULIZER EVERY 6 HOURS AS NEEDED FOR WHEEZING/SHORTNESS OF BREATH. (Patient taking differently: Take 2.5 mg by nebulization every 6 (six) hours as needed for shortness of breath.) 375 mL 11  ? allopurinol (ZYLOPRIM) 300 MG tablet Take 1 tablet (300 mg total) by mouth daily. 90 tablet 0  ? amiodarone (PACERONE) 200 MG tablet Take 1 tablet (200 mg total) by mouth daily. 90 tablet 3  ? budesonide-formoterol (SYMBICORT) 80-4.5 MCG/ACT inhaler Inhale 2 puffs into the lungs 2 (  two) times daily. 3 each 3  ? carvedilol (COREG) 12.5 MG tablet Take 1 tablet (12.5 mg total) by mouth 2 (two) times daily with a meal. 180 tablet 0  ? Cholecalciferol (VITAMIN D3) 50 MCG (2000 UT) TABS Take 2,000 Units by mouth daily.    ? colchicine 0.6 MG tablet Take 0.6 mg by mouth daily as needed (gout flares).     ? doxazosin (CARDURA) 2 MG tablet Take 1 tablet (2 mg total) by mouth daily. 30 tablet 0  ? hydrALAZINE (APRESOLINE) 100 MG tablet Take 1 tablet (100 mg total) by mouth every 8 (eight) hours. 90 tablet 0  ? isosorbide mononitrate (IMDUR) 120 MG 24 hr tablet Take 1 tablet (120 mg total) by mouth daily. 90 tablet 3  ? KLOR-CON M20 20 MEQ tablet TAKE 2 TABLETS (40 MEQ) EVERY MORNING AND 1 TABLET (20 MEQ) EVERY EVENING 270 tablet 3  ? naproxen sodium (ALEVE) 220 MG tablet Take 440 mg by mouth daily as needed (pain/headache).    ? oxymetazoline (AFRIN NASAL SPRAY) 0.05 % nasal spray Place 1 spray into both nostrils 2 (two) times daily as needed for congestion.    ? Probiotic Product (PROBIOTIC DAILY PO) Take 1 capsule by mouth daily.    ? torsemide (DEMADEX) 20 MG  tablet Take 2 tablets (40 mg total) by mouth 2 (two) times daily. 360 tablet 3  ? UNABLE TO FIND Med Name: CPAP with sleep    ? XARELTO 20 MG TABS tablet TAKE 1 TABLET DAILY WITH SUPPER (NEEDS APPOINTMEN

## 2021-07-30 ENCOUNTER — Telehealth: Payer: Self-pay

## 2021-07-30 LAB — KAPPA/LAMBDA LIGHT CHAINS
Kappa free light chain: 36.6 mg/L — ABNORMAL HIGH (ref 3.3–19.4)
Kappa, lambda light chain ratio: 2.56 — ABNORMAL HIGH (ref 0.26–1.65)
Lambda free light chains: 14.3 mg/L (ref 5.7–26.3)

## 2021-07-30 LAB — HIV ANTIBODY (ROUTINE TESTING W REFLEX): HIV Screen 4th Generation wRfx: NONREACTIVE

## 2021-07-30 NOTE — Telephone Encounter (Signed)
Called and informed patient of lab results and Dr. Collie Siad recommendations. Patient verbalized understanding.  ? ? ?Please schedule patient for: ?B12 injection weekly x4 ?IV venofer weekly x 3  ?Please inform patient of appts. Thanks  ? ?

## 2021-07-30 NOTE — Telephone Encounter (Signed)
-----   Message from Earlie Server, MD sent at 07/29/2021 10:06 PM EDT ----- ?Please let patient know that vitamin B12 level is low.  Recommend vitamin B12 injection weekly x4.  Also he may benefit from IV Venofer treatments to further improve his iron stores.  Recommend IV Venofer weekly x3.  He is interested, please schedule. ?There are some testing which are still pending.  This test will be reviewed at his next visit.  Thank you ?

## 2021-07-31 LAB — COMP PANEL: LEUKEMIA/LYMPHOMA

## 2021-08-01 ENCOUNTER — Encounter (HOSPITAL_COMMUNITY): Payer: Self-pay | Admitting: Internal Medicine

## 2021-08-01 ENCOUNTER — Ambulatory Visit (HOSPITAL_COMMUNITY)
Admission: RE | Admit: 2021-08-01 | Discharge: 2021-08-01 | Disposition: A | Discharge status: Home / Self Care | Payer: Managed Care, Other (non HMO) | Source: Ambulatory Visit | Attending: Internal Medicine | Admitting: Internal Medicine

## 2021-08-01 VITALS — BP 114/56 | HR 58 | Wt 312.2 lb

## 2021-08-01 DIAGNOSIS — N1832 Chronic kidney disease, stage 3b: Secondary | ICD-10-CM | POA: Diagnosis not present

## 2021-08-01 DIAGNOSIS — G4733 Obstructive sleep apnea (adult) (pediatric): Secondary | ICD-10-CM | POA: Diagnosis not present

## 2021-08-01 DIAGNOSIS — Z79899 Other long term (current) drug therapy: Secondary | ICD-10-CM | POA: Diagnosis not present

## 2021-08-01 DIAGNOSIS — I5032 Chronic diastolic (congestive) heart failure: Secondary | ICD-10-CM | POA: Diagnosis not present

## 2021-08-01 DIAGNOSIS — Z7901 Long term (current) use of anticoagulants: Secondary | ICD-10-CM | POA: Diagnosis not present

## 2021-08-01 DIAGNOSIS — I10 Essential (primary) hypertension: Secondary | ICD-10-CM

## 2021-08-01 DIAGNOSIS — I5042 Chronic combined systolic (congestive) and diastolic (congestive) heart failure: Secondary | ICD-10-CM | POA: Insufficient documentation

## 2021-08-01 DIAGNOSIS — Z6841 Body Mass Index (BMI) 40.0 and over, adult: Secondary | ICD-10-CM | POA: Diagnosis not present

## 2021-08-01 DIAGNOSIS — I483 Typical atrial flutter: Secondary | ICD-10-CM | POA: Diagnosis not present

## 2021-08-01 DIAGNOSIS — I130 Hypertensive heart and chronic kidney disease with heart failure and stage 1 through stage 4 chronic kidney disease, or unspecified chronic kidney disease: Secondary | ICD-10-CM | POA: Diagnosis not present

## 2021-08-01 DIAGNOSIS — I480 Paroxysmal atrial fibrillation: Secondary | ICD-10-CM | POA: Insufficient documentation

## 2021-08-01 DIAGNOSIS — Z86718 Personal history of other venous thrombosis and embolism: Secondary | ICD-10-CM | POA: Insufficient documentation

## 2021-08-01 DIAGNOSIS — Z9989 Dependence on other enabling machines and devices: Secondary | ICD-10-CM | POA: Diagnosis not present

## 2021-08-01 MED ORDER — CARVEDILOL 6.25 MG PO TABS: 12.5000 mg | ORAL_TABLET | Freq: Two times a day (BID) | ORAL | 3 refills | Status: DC

## 2021-08-01 NOTE — Patient Instructions (Addendum)
Good to see you today! ? ?DECREASE Coreg to 6.25 mg Twice daily ? ?No lab work needed today ? ?You have been referred to [facility] for ablation,they will call you for an appointment ? ?Your physician recommends that you schedule a follow-up appointment in: 6 months November 2023 ?Call office in September 2023 for an appointment ? ?If you have any questions or concerns before your next appointment please send [place] a message through [facility] or call our office at [phone].   ? ?TO LEAVE A MESSAGE FOR THE NURSE SELECT OPTION 2, PLEASE LEAVE A MESSAGE INCLUDING: ?YOUR NAME ?DATE OF BIRTH ?CALL BACK NUMBER ?REASON FOR CALL**this is important as we prioritize the call backs ? ?YOU WILL RECEIVE A CALL BACK THE SAME DAY AS LONG AS YOU CALL BEFORE 4:00 PM ? ?At the [facility], you and your health needs are our priority. As part of our continuing mission to provide you with exceptional heart care, we have created designated Provider Care Teams. These Care Teams include your primary Cardiologist (physician) and Advanced Practice Providers (APPs- Physician Assistants and Nurse Practitioners) who all work together to provide you with the care you need, when you need it.  ? ?You may see any of the following providers on your designated Care Team at your next follow up: ?Dr Glori Bickers ?Dr Loralie Champagne ?Darrick Grinder, NP ?Lyda Jester, PA ?Jessica Milford,NP ?Marlyce Huge, PA ?Audry Riles, PharmD ? ? ?Please be sure to bring in all your medications bottles to every appointment.  ? ? ? ?

## 2021-08-01 NOTE — Progress Notes (Signed)
?Advanced Heart Failure Clinic Note ?  ?Primary Care: [facility] ?Primary Cardiologist: Dr. [name]  ?EP: Dr Rayann Heman ?Nephrology: Dr. Candiss Norse ?HF MD: Dr Haroldine Laws  ? ?HPI: ?Phillip [name] is a 61 y.o. male hx of PAF, remote PE, hx of DVT, OSA, morbid obesity, CKD Stage III,  HTN, and  combined chronic systolic/diastolic. S/P A Flutter ablation May 2019 .    ? ?EF in 2015 40-45% ?Echo 12/21/16 with EF 55-60% G2DD, mild aortic regurgitation.  Last nuc 2008 no ischemia, no hx of cath due to CKD.   ? ?Developed AFL and underwent AFL ablation in 5/19. ? ?Dr. Haroldine Laws saw him 10/11/18 and was feeling bad. More SOB. Having some pressure in his chest and arm. + LE edema. Found to be in AFL/AF in 80s. Echo at that time with EF back down to 30%. Underwent DC-CV on 10/14/18 and referred to AF Clinic.  ? ?Developed recurrent AF at the end of January 2021. Underwent attempt at [facility] on 04/22/19 but failed to convert. Seen back in AF Clinic on 2/5. Not able to use Tikosyn due to long QT. Not ablation candidate due to obesity. Considered amiodarone. Started on flecainide but  had treadmill to monitor Flecainide Tx.   Patient had significant QRS widening within first minute of exercise.  Flecainide stopped ? ?Recurrent AF in 9/22. Admitted. Had DC-CV. Amiodarone started.  ? ?Echo 11/20 EF 45-50% ? ?Echo 5/21 EF 45-50% ? ?Echo 9/22 EF 45-50% Marked LAE  ? ?Follow up 12/22 feeling well. No further AF on amiodarone. ? ?Admitted 1/23 with a/c CHF and afib RVR. Echo EF 40-45%, global LV HK, RV ok. Diuresed and underwent DCCV. Hospitalization c/b AKI. Carvedilol reduced to 12.5 bi and doxazosin added. No ARB/ARNi with CKD IV. Weight 314 lbs. ? ?Today he returns for HF follow up. Doing pretty well. Losing weight from 430 -> 303 pounds. Feeling better. Takes extra lasix as needed. Mild DOE. Walking regularly. Watching portion size. 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  ? ? ?Cardiac Studies: ?- Last nuc 2008 no ischemia ? ?- Echo 2015 EF  40-45% ? ?- Echo 12/21/16 with EF 55-60% G2DD, mild aortic regurgitation.   ? ?- Echo 7/20 EF 30% ? ?- Echo 11/20 EF 45-50% ? ?- Echo 5/21 EF 45-50% ? ?- Echo 9/22 EF 45-50% Marked LAE  ? ?- Echo 1/23 EF 40-45%, global LV HK, RV ok. ? ? ?Review of systems complete and found to be negative unless listed in HPI.   ? ?Past Medical History:  ?Diagnosis Date  ? Asthma   ? CHF (congestive heart failure) ([place]) 11/15  ? EF 40-45%, improved with follow-up  ? Chronic kidney disease   ? COPD (chronic obstructive pulmonary disease) ([place])   ? Deep vein thrombosis (DVT) of lower extremity ([place]) 08/17/2015  ? DVT (deep venous thrombosis) ([place])   ? Gout   ? Hypertension   ? Hypertensive cardiovascular disease   ? Kidney dysfunction   ? Morbid obesity ([place])   ? OSA on CPAP   ? Paroxysmal atrial fibrillation ([facility])   ? PNA (pneumonia)   ? Pulmonary embolism ([place]) 2010  ? left leg DVT  ? Sleep apnea   ? uses cpap  ? ?Current Outpatient Medications  ?Medication Sig Dispense Refill  ? albuterol (PROAIR HFA) 108 (90 Base) MCG/ACT inhaler Inhale 2 puffs into the lungs every 4 (four) hours as needed for wheezing or shortness of breath. (Patient taking differently: Inhale 2 puffs into the lungs  every 4 (four) hours as needed for shortness of breath.) 1 Inhaler 3  ? albuterol (PROVENTIL) (2.5 MG/3ML) 0.083% nebulizer solution INHALE 1 VIAL VIA NEBULIZER EVERY 6 HOURS AS NEEDED FOR WHEEZING/SHORTNESS OF BREATH. (Patient taking differently: Take 2.5 mg by nebulization every 6 (six) hours as needed for shortness of breath.) 375 mL 11  ? allopurinol (ZYLOPRIM) 300 MG tablet Take 1 tablet (300 mg total) by mouth daily. 90 tablet 0  ? amiodarone (PACERONE) 200 MG tablet Take 1 tablet (200 mg total) by mouth daily. 90 tablet 3  ? budesonide-formoterol (SYMBICORT) 80-4.5 MCG/ACT inhaler Inhale 2 puffs into the lungs 2 (two) times daily. 3 each 3  ? carvedilol (COREG) 12.5 MG tablet Take 1 tablet (12.5 mg total) by mouth 2 (two) times daily with a meal.  180 tablet 0  ? Cholecalciferol (VITAMIN D3) 50 MCG (2000 UT) TABS Take 2,000 Units by mouth daily.    ? colchicine 0.6 MG tablet Take 0.6 mg by mouth daily as needed (gout flares).     ? doxazosin (CARDURA) 2 MG tablet Take 1 tablet (2 mg total) by mouth daily. 30 tablet 0  ? hydrALAZINE (APRESOLINE) 100 MG tablet Take 1 tablet (100 mg total) by mouth every 8 (eight) hours. 90 tablet 0  ? isosorbide mononitrate (IMDUR) 120 MG 24 hr tablet Take 1 tablet (120 mg total) by mouth daily. 90 tablet 3  ? KLOR-CON M20 20 MEQ tablet TAKE 2 TABLETS (40 MEQ) EVERY MORNING AND 1 TABLET (20 MEQ) EVERY EVENING 270 tablet 3  ? oxymetazoline (AFRIN NASAL SPRAY) 0.05 % nasal spray Place 1 spray into both nostrils 2 (two) times daily as needed for congestion.    ? Probiotic Product (PROBIOTIC DAILY PO) Take 1 capsule by mouth daily.    ? torsemide (DEMADEX) 20 MG tablet Take 2 tablets (40 mg total) by mouth 2 (two) times daily. 360 tablet 3  ? UNABLE TO FIND Med Name: CPAP with sleep    ? XARELTO 20 MG TABS tablet TAKE 1 TABLET DAILY WITH SUPPER (NEEDS APPOINTMENT FOR FURTHER REFILLS) 90 tablet 3  ? ?No current facility-administered medications for this encounter.  ? ?Allergies  ?Allergen Reactions  ? Other Anaphylaxis  ?  mushrooms  ? ? ?Social History  ? ?Socioeconomic History  ? Marital status: Divorced  ?  Spouse name: Not on file  ? Number of children: 2  ? Years of education: 57  ? Highest education level: Not on file  ?Occupational History  ? Occupation: Drives dump truck  ?  Comment:    ? Occupation: Airline pilot  ?  Comment: Disabled  ?Tobacco Use  ? Smoking status: Former  ?  Packs/day: 0.25  ?  Years: 1.00  ?  Pack years: 0.25  ?  Types: Cigarettes  ?  Quit date: 07/23/1989  ?  Years since quitting: 32.0  ? Smokeless tobacco: Never  ? Tobacco comments:  ?  Former smoker 03/06/2021  ?Vaping Use  ? Vaping Use: Never used  ?Substance and Sexual Activity  ? Alcohol use: No  ?  Alcohol/week: 0.0 standard drinks  ? Drug use: No  ?  Sexual activity: Not Currently  ?Other Topics Concern  ? Not on file  ?Social History Narrative  ? Pt lives in [place], alone.  ? 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 Truck driver now x 27 yrs.  ? Has 2 sons in [place]  ?   ? No living will  ? Would want sons  to make decisions for him  ? Would accept resuscitation  ? ?Social Determinants of Health  ? ?Financial Resource Strain: Not on file  ?Food Insecurity: Not on file  ?Transportation Needs: Not on file  ?Physical Activity: Not on file  ?Stress: Not on file  ?Social Connections: Not on file  ?Intimate Partner Violence: Not on file  ?  ?Family History  ?Problem Relation Age of Onset  ? Hypertension Mother   ? Diabetes Father   ? CAD Father   ? Clotting disorder Father   ? Heart disease Father   ?     transplant  ? Stomach cancer Father   ? Allergies Brother   ? Colon cancer Neg Hx   ? Colon polyps Neg Hx   ? Esophageal cancer Neg Hx   ? Rectal cancer Neg Hx   ? ?BP (!) 114/56   Pulse (!) 58   Wt (!) 141.6 kg (312 lb 3.2 oz)   SpO2 94%   BMI 46.10 kg/m?  ? ?Wt Readings from Last 3 Encounters:  ?08/01/21 (!) 141.6 kg (312 lb 3.2 oz)  ?07/29/21 (!) 142.9 kg (315 lb)  ?05/07/21 (!) 142.9 kg (315 lb)  ? ?PHYSICAL EXAM: ?General:  Well appearing. No resp difficulty ?HEENT: normal ?Neck: supple. no JVD. Carotids 2+ bilat; no bruits. No lymphadenopathy or thryomegaly appreciated. ?Cor: PMI nondisplaced. Regular rate & rhythm. No rubs, gallops or murmurs. ?Lungs: clear ?Abdomen: soft, nontender, nondistended. No hepatosplenomegaly. No bruits or masses. Good bowel sounds. ?Extremities: no cyanosis, clubbing, rash, edema ?Neuro: alert & orientedx3, cranial nerves grossly intact. moves all 4 extremities w/o difficulty. Affect pleasant ? ? ? ?ECG SB 50 bpm Personally reviewed ? ?ASSESSMENT & PLAN: ? ?1. Chronic systolic/diastolic HF ?- Echo 04/7251 LVEF 55-60%, Grade 2 DD. ?- EF 7/20 back down to 30%. Suspect due to AF but rate not overly elevated. HTN may also be  playing a role. ?- Symptoms improved after DC-CV on 10/11/18  ?- Echo 11/20 EF 45-50% ?- Echo 5/21 EF 45-50% ?- Echo 9/22 EF 45-50% ?- Echo 1/23 EF 40-45%, global LV HK, RV ok. ?- Stable NYHA II. Volume status l

## 2021-08-02 ENCOUNTER — Encounter (HOSPITAL_COMMUNITY): Payer: Self-pay | Admitting: *Deleted

## 2021-08-02 LAB — MULTIPLE MYELOMA PANEL, SERUM
Albumin SerPl Elph-Mcnc: 3.4 g/dL (ref 2.9–4.4)
Albumin/Glob SerPl: 1.8 — ABNORMAL HIGH (ref 0.7–1.7)
Alpha 1: 0.2 g/dL (ref 0.0–0.4)
Alpha2 Glob SerPl Elph-Mcnc: 0.4 g/dL (ref 0.4–1.0)
B-Globulin SerPl Elph-Mcnc: 0.8 g/dL (ref 0.7–1.3)
Gamma Glob SerPl Elph-Mcnc: 0.7 g/dL (ref 0.4–1.8)
Globulin, Total: 2 g/dL — ABNORMAL LOW (ref 2.2–3.9)
IgA: 184 mg/dL (ref 90–386)
IgG (Immunoglobin G), Serum: 796 mg/dL (ref 603–1613)
IgM (Immunoglobulin M), Srm: 21 mg/dL (ref 20–172)
Total Protein ELP: 5.4 g/dL — ABNORMAL LOW (ref 6.0–8.5)

## 2021-08-02 NOTE — Progress Notes (Signed)
Demographics, ins card, and OV note faxed to Dr Hranitzky's office at [phone] ?

## 2021-08-05 ENCOUNTER — Telehealth: Payer: Self-pay

## 2021-08-05 DIAGNOSIS — D631 Anemia in chronic kidney disease: Secondary | ICD-10-CM

## 2021-08-05 DIAGNOSIS — D696 Thrombocytopenia, unspecified: Secondary | ICD-10-CM

## 2021-08-05 NOTE — Telephone Encounter (Signed)
Patient informed of the need for 24 hr urine and will pick up Jug tomorrow when he is here for infusion.  ?

## 2021-08-05 NOTE — Telephone Encounter (Signed)
-----   Message from Earlie Server, MD sent at 08/03/2021  2:43 PM EDT ----- ?Light chain ratio is slight elevated non specific, please arrange him to get 24 h UPEP. ?

## 2021-08-05 NOTE — Telephone Encounter (Signed)
Spoke to pt and he will pick up urine jug tomorrow when he comes for infusion. Infusion nurse Allyson informed as well.  ?

## 2021-08-06 ENCOUNTER — Inpatient Hospital Stay: Payer: Managed Care, Other (non HMO)

## 2021-08-06 VITALS — BP 150/54 | HR 58 | Temp 96.2°F | Resp 16

## 2021-08-06 DIAGNOSIS — I130 Hypertensive heart and chronic kidney disease with heart failure and stage 1 through stage 4 chronic kidney disease, or unspecified chronic kidney disease: Secondary | ICD-10-CM | POA: Diagnosis not present

## 2021-08-06 DIAGNOSIS — E538 Deficiency of other specified B group vitamins: Secondary | ICD-10-CM

## 2021-08-06 MED ORDER — IRON SUCROSE 20 MG/ML IV SOLN
200.0000 mg | Freq: Once | INTRAVENOUS | Status: AC
  Administered 2021-08-06: 200 mg via INTRAVENOUS
  Filled 2021-08-06: qty 10

## 2021-08-06 MED ORDER — CYANOCOBALAMIN 1000 MCG/ML IJ SOLN
1000.0000 ug | Freq: Once | INTRAMUSCULAR | Status: AC
  Administered 2021-08-06: 1000 ug via INTRAMUSCULAR
  Filled 2021-08-06: qty 1

## 2021-08-06 MED ORDER — SODIUM CHLORIDE 0.9 % IV SOLN: 200.0000 mg | Freq: Once | INTRAVENOUS | Status: DC

## 2021-08-06 MED ORDER — SODIUM CHLORIDE 0.9 % IV SOLN
Freq: Once | INTRAVENOUS | Status: AC
  Filled 2021-08-06: qty 250

## 2021-08-06 NOTE — Patient Instructions (Signed)

## 2021-08-07 ENCOUNTER — Ambulatory Visit (INDEPENDENT_AMBULATORY_CARE_PROVIDER_SITE_OTHER): Payer: Managed Care, Other (non HMO) | Admitting: Internal Medicine

## 2021-08-07 ENCOUNTER — Encounter: Payer: Self-pay | Admitting: Internal Medicine

## 2021-08-07 VITALS — BP 136/72 | HR 51 | Ht 66.5 in | Wt 304.2 lb

## 2021-08-07 DIAGNOSIS — J4530 Mild persistent asthma, uncomplicated: Secondary | ICD-10-CM

## 2021-08-07 DIAGNOSIS — Z23 Encounter for immunization: Secondary | ICD-10-CM

## 2021-08-07 DIAGNOSIS — I5032 Chronic diastolic (congestive) heart failure: Secondary | ICD-10-CM

## 2021-08-07 DIAGNOSIS — I480 Paroxysmal atrial fibrillation: Secondary | ICD-10-CM

## 2021-08-07 DIAGNOSIS — Z0000 Encounter for general adult medical examination without abnormal findings: Secondary | ICD-10-CM

## 2021-08-07 DIAGNOSIS — N184 Chronic kidney disease, stage 4 (severe): Secondary | ICD-10-CM

## 2021-08-07 NOTE — Assessment & Plan Note (Signed)
Back regular on the amiodarone ?Is on xarelto ?Will need thyroid tests with next blood draw ?

## 2021-08-07 NOTE — Assessment & Plan Note (Signed)
Doing well  ?Colon due 2031 ?Consider PSA with next blood draw ?COVID vaccine in the fall---prefers no flu (made him sick) ?Will give first shingrix ?

## 2021-08-07 NOTE — Addendum Note (Signed)
Addended by: Tyrone Apple on: 08/07/2021 09:49 AM ? ? Modules accepted: Orders ? ?

## 2021-08-07 NOTE — Assessment & Plan Note (Signed)
Quiet on the symbicort ?

## 2021-08-07 NOTE — Assessment & Plan Note (Signed)
Following with Dr Candiss Norse ?

## 2021-08-07 NOTE — Assessment & Plan Note (Signed)
Compensated now ?On torsemide 40 and isosorbide 120 ?Carvedilol 12/5 bid, hydralazine 100 tid ?

## 2021-08-07 NOTE — Progress Notes (Signed)
? ?Subjective:  ? ? Patient ID: Phillip [name], male    DOB: March 01, 1961, 61 y.o.   MRN: [id number] ? ?HPI ?Here for physical ?Has lost another 10# in past 3 months ?Has increased his walking--also does some weights for resistance ?Still driving truck ? ?Heart back in rhythm on amiodarone ?Breathing is better ? ?CKD is about the same ?Continues with Dr Donia Ast be going for dialysis education class ? ?Current Outpatient Medications on File Prior to Visit  ?Medication Sig Dispense Refill  ? albuterol (PROAIR HFA) 108 (90 Base) MCG/ACT inhaler Inhale 2 puffs into the lungs every 4 (four) hours as needed for wheezing or shortness of breath. (Patient taking differently: Inhale 2 puffs into the lungs every 4 (four) hours as needed for shortness of breath.) 1 Inhaler 3  ? albuterol (PROVENTIL) (2.5 MG/3ML) 0.083% nebulizer solution INHALE 1 VIAL VIA NEBULIZER EVERY 6 HOURS AS NEEDED FOR WHEEZING/SHORTNESS OF BREATH. (Patient taking differently: Take 2.5 mg by nebulization every 6 (six) hours as needed for shortness of breath.) 375 mL 11  ? allopurinol (ZYLOPRIM) 300 MG tablet Take 1 tablet (300 mg total) by mouth daily. 90 tablet 0  ? amiodarone (PACERONE) 200 MG tablet Take 1 tablet (200 mg total) by mouth daily. 90 tablet 3  ? budesonide-formoterol (SYMBICORT) 80-4.5 MCG/ACT inhaler Inhale 2 puffs into the lungs 2 (two) times daily. 3 each 3  ? carvedilol (COREG) 6.25 MG tablet Take 2 tablets (12.5 mg total) by mouth 2 (two) times daily with a meal. 180 tablet 3  ? Cholecalciferol (VITAMIN D3) 50 MCG (2000 UT) TABS Take 2,000 Units by mouth daily.    ? colchicine 0.6 MG tablet Take 0.6 mg by mouth daily as needed (gout flares).     ? doxazosin (CARDURA) 2 MG tablet Take 1 tablet (2 mg total) by mouth daily. 30 tablet 0  ? hydrALAZINE (APRESOLINE) 100 MG tablet Take 1 tablet (100 mg total) by mouth every 8 (eight) hours. 90 tablet 0  ? isosorbide mononitrate (IMDUR) 120 MG 24 hr tablet Take 1 tablet (120 mg total) by mouth  daily. 90 tablet 3  ? KLOR-CON M20 20 MEQ tablet TAKE 2 TABLETS (40 MEQ) EVERY MORNING AND 1 TABLET (20 MEQ) EVERY EVENING 270 tablet 3  ? oxymetazoline (AFRIN NASAL SPRAY) 0.05 % nasal spray Place 1 spray into both nostrils 2 (two) times daily as needed for congestion.    ? Probiotic Product (PROBIOTIC DAILY PO) Take 1 capsule by mouth daily.    ? torsemide (DEMADEX) 20 MG tablet Take 2 tablets (40 mg total) by mouth 2 (two) times daily. 360 tablet 3  ? XARELTO 20 MG TABS tablet TAKE 1 TABLET DAILY WITH SUPPER (NEEDS APPOINTMENT FOR FURTHER REFILLS) 90 tablet 3  ? ?No current facility-administered medications on file prior to visit.  ? ? ?Allergies  ?Allergen Reactions  ? Other Anaphylaxis  ?  mushrooms  ? ? ?Past Medical History:  ?Diagnosis Date  ? Asthma   ? CHF (congestive heart failure) ([place]) 11/15  ? EF 40-45%, improved with follow-up  ? Chronic kidney disease   ? COPD (chronic obstructive pulmonary disease) ([place])   ? Deep vein thrombosis (DVT) of lower extremity ([place]) 08/17/2015  ? DVT (deep venous thrombosis) ([place])   ? Gout   ? Hypertension   ? Hypertensive cardiovascular disease   ? Kidney dysfunction   ? Morbid obesity ([place])   ? OSA on CPAP   ? Paroxysmal atrial fibrillation ([facility])   ?  PNA (pneumonia)   ? Pulmonary embolism ([place]) 2010  ? left leg DVT  ? Sleep apnea   ? uses cpap  ? ? ?Past Surgical History:  ?Procedure Laterality Date  ? A-FLUTTER ABLATION N/A 07/22/2017  ? Procedure: A-FLUTTER ABLATION;  Surgeon: Thompson Grayer, MD;  Location: [facility];  Service: Cardiovascular;  Laterality: N/A;  ? CARDIOVERSION N/A 10/14/2018  ? Procedure: CARDIOVERSION;  Surgeon: Jolaine Artist, MD;  Location: [facility];  Service: Cardiovascular;  Laterality: N/A;  ? CARDIOVERSION N/A 04/22/2019  ? Procedure: CARDIOVERSION;  Surgeon: Thayer Headings, MD;  Location: [facility];  Service: Cardiovascular;  Laterality: N/A;  ? CARDIOVERSION N/A 05/10/2019  ? Procedure: CARDIOVERSION;  Surgeon: Jolaine Artist, MD;  Location: [facility];  Service: Cardiovascular;  Laterality: N/A;  ? CARDIOVERSION N/A 11/23/2020  ? Procedure: CARDIOVERSION;  Surgeon: Pixie Casino, MD;  Location: [facility];  Service: Cardiovascular;  Laterality: N/A;  ? CARDIOVERSION N/A 04/16/2021  ? Procedure: CARDIOVERSION;  Surgeon: Berniece Salines, DO;  Location: [facility];  Service: Cardiovascular;  Laterality: N/A;  ? COLONOSCOPY WITH PROPOFOL N/A 12/20/2019  ? Procedure: COLONOSCOPY WITH PROPOFOL;  Surgeon: Yetta Flock, MD;  Location: [facility];  Service: Gastroenterology;  Laterality: N/A;  ? POLYPECTOMY  12/20/2019  ? Procedure: POLYPECTOMY;  Surgeon: Yetta Flock, MD;  Location: [facility];  Service: Gastroenterology;;  ? [place]  ? ? ?Family History  ?Problem Relation Age of Onset  ? Hypertension Mother   ? Diabetes Father   ? CAD Father   ? Clotting disorder Father   ? Heart disease Father   ?     transplant  ? Stomach cancer Father   ? Allergies Brother   ? Colon cancer Neg Hx   ? Colon polyps Neg Hx   ? Esophageal cancer Neg Hx   ? Rectal cancer Neg Hx   ? ? ?Social History  ? ?Socioeconomic History  ? Marital status: Divorced  ?  Spouse name: Not on file  ? Number of children: 2  ? Years of education: 65  ? Highest education level: Not on file  ?Occupational History  ? Occupation: Drives dump truck  ?  Comment:    ? Occupation: Airline pilot  ?  Comment: Disabled  ?Tobacco Use  ? Smoking status: Former  ?  Packs/day: 0.25  ?  Years: 1.00  ?  Pack years: 0.25  ?  Types: Cigarettes  ?  Quit date: 07/23/1989  ?  Years since quitting: 32.0  ? Smokeless tobacco: Never  ? Tobacco comments:  ?  Former smoker 03/06/2021  ?Vaping Use  ? Vaping Use: Never used  ?Substance and Sexual Activity  ? Alcohol use: No  ?  Alcohol/week: 0.0 standard drinks  ? Drug use: No  ? Sexual activity: Not Currently  ?Other Topics Concern  ? Not on file  ?Social History Narrative  ? Pt lives in [place], alone.  ?  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 Truck driver now x 27 yrs.  ? Has 2 sons in [place]  ?   ? No living will  ? Would want sons to make decisions for him  ? Would accept resuscitation  ? ?Social Determinants of Health  ? ?Financial Resource Strain: Not on file  ?Food Insecurity: Not on file  ?Transportation Needs: Not on file  ?Physical Activity: Not on file  ?Stress: Not on file  ?Social Connections: Not on file  ?Intimate Partner  Violence: Not on file  ? ?Review of Systems  ?Constitutional:  Positive for fatigue.  ?     Just had iron infusion and B12 injection--will go back for more iron ?Wears seat belt  ?HENT:  Negative for dental problem, hearing loss and tinnitus.   ?Eyes:  Negative for visual disturbance.  ?     No diplopia or unilateral vision loss  ?Respiratory:  Negative for cough, chest tightness and shortness of breath.   ?     Uses symbicort bid ?Hasn't needed the albuterol much  ?Cardiovascular:  Negative for chest pain and palpitations.  ?     Some leg swelling with persistent sitting in his truck  ?Gastrointestinal:  Positive for constipation. Negative for blood in stool.  ?     Bowels better with more vegetable----has uses MOM rarely ?No heartburn  ?Endocrine: Negative for polydipsia and polyuria.  ?Genitourinary:  Negative for difficulty urinating and urgency.  ?     No sexual problems  ?Musculoskeletal:  Negative for back pain and joint swelling.  ?     Still has some left knee pain---no meds now (discussed trying tylenol)  ?Skin:  Negative for rash.  ?Allergic/Immunologic: Negative for environmental allergies and immunocompromised state.  ?Neurological:  Negative for dizziness, syncope, light-headedness and headaches.  ?Hematological:  Negative for adenopathy. Bruises/bleeds easily.  ?Psychiatric/Behavioral:  Negative for dysphoric mood and sleep disturbance. The patient is not nervous/anxious.   ?     Uses CPAP nightly and it works well  ? ?   ?Objective:  ? Physical Exam ?Constitutional:    ?   Appearance: Normal appearance.  ?HENT:  ?   Mouth/Throat:  ?   Pharynx: No oropharyngeal exudate or posterior oropharyngeal erythema.  ?Eyes:  ?   Conjunctiva/sclera: Conjunctivae normal.  ?   Pupi

## 2021-08-12 ENCOUNTER — Other Ambulatory Visit: Payer: Self-pay

## 2021-08-12 DIAGNOSIS — D696 Thrombocytopenia, unspecified: Secondary | ICD-10-CM

## 2021-08-12 DIAGNOSIS — I130 Hypertensive heart and chronic kidney disease with heart failure and stage 1 through stage 4 chronic kidney disease, or unspecified chronic kidney disease: Secondary | ICD-10-CM | POA: Diagnosis not present

## 2021-08-13 ENCOUNTER — Inpatient Hospital Stay: Payer: Managed Care, Other (non HMO)

## 2021-08-13 VITALS — BP 148/59 | HR 58 | Temp 97.0°F | Resp 20

## 2021-08-13 DIAGNOSIS — I130 Hypertensive heart and chronic kidney disease with heart failure and stage 1 through stage 4 chronic kidney disease, or unspecified chronic kidney disease: Secondary | ICD-10-CM | POA: Diagnosis not present

## 2021-08-13 DIAGNOSIS — E538 Deficiency of other specified B group vitamins: Secondary | ICD-10-CM

## 2021-08-13 LAB — IFE+PROTEIN ELECTRO, 24-HR UR
% BETA, Urine: 0 %
ALPHA 1 URINE: 0 %
Albumin, U: 100 %
Alpha 2, Urine: 0 %
GAMMA GLOBULIN URINE: 0 %
Total Protein, Urine-Ur/day: 179 mg/24 hr — ABNORMAL HIGH (ref 30–150)
Total Protein, Urine: 6.9 mg/dL
Total Volume: 2600

## 2021-08-13 MED ORDER — IRON SUCROSE 20 MG/ML IV SOLN
200.0000 mg | Freq: Once | INTRAVENOUS | Status: AC
  Administered 2021-08-13: 200 mg via INTRAVENOUS
  Filled 2021-08-13: qty 10

## 2021-08-13 MED ORDER — SODIUM CHLORIDE 0.9 % IV SOLN: 200.0000 mg | Freq: Once | INTRAVENOUS | Status: DC

## 2021-08-13 MED ORDER — CYANOCOBALAMIN 1000 MCG/ML IJ SOLN
1000.0000 ug | Freq: Once | INTRAMUSCULAR | Status: AC
  Administered 2021-08-13: 1000 ug via INTRAMUSCULAR
  Filled 2021-08-13: qty 1

## 2021-08-13 MED ORDER — SODIUM CHLORIDE 0.9 % IV SOLN
Freq: Once | INTRAVENOUS | Status: AC
  Filled 2021-08-13: qty 250

## 2021-08-20 ENCOUNTER — Inpatient Hospital Stay: Payer: Managed Care, Other (non HMO)

## 2021-08-20 VITALS — BP 163/63 | HR 62 | Temp 96.7°F | Resp 18

## 2021-08-20 DIAGNOSIS — E538 Deficiency of other specified B group vitamins: Secondary | ICD-10-CM

## 2021-08-20 DIAGNOSIS — I130 Hypertensive heart and chronic kidney disease with heart failure and stage 1 through stage 4 chronic kidney disease, or unspecified chronic kidney disease: Secondary | ICD-10-CM | POA: Diagnosis not present

## 2021-08-20 MED ORDER — IRON SUCROSE 20 MG/ML IV SOLN
200.0000 mg | Freq: Once | INTRAVENOUS | Status: AC
  Administered 2021-08-20: 200 mg via INTRAVENOUS
  Filled 2021-08-20: qty 10

## 2021-08-20 MED ORDER — SODIUM CHLORIDE 0.9 % IV SOLN: 200.0000 mg | Freq: Once | INTRAVENOUS | Status: DC

## 2021-08-20 MED ORDER — CYANOCOBALAMIN 1000 MCG/ML IJ SOLN
1000.0000 ug | Freq: Once | INTRAMUSCULAR | Status: AC
  Administered 2021-08-20: 1000 ug via INTRAMUSCULAR
  Filled 2021-08-20: qty 1

## 2021-08-20 MED ORDER — SODIUM CHLORIDE 0.9 % IV SOLN
Freq: Once | INTRAVENOUS | Status: AC
  Filled 2021-08-20: qty 250

## 2021-08-20 NOTE — Patient Instructions (Signed)
[facility]CER CTR AT [place]  Discharge Instructions: Thank you for choosing [facility] to provide your oncology and hematology care.  If you have a lab appointment with the [facility], please go directly to the [facility] and check in at the registration area.  Wear comfortable clothing and clothing appropriate for easy access to any Portacath or PICC line.   We strive to give you quality time with your provider. You may need to reschedule your appointment if you arrive late (15 or more minutes).  Arriving late affects you and other patients whose appointments are after yours.  Also, if you miss three or more appointments without notifying the office, you may be dismissed from the clinic at the provider's discretion.      For prescription refill requests, have your pharmacy contact our office and allow 72 hours for refills to be completed.    Today you received the following chemotherapy and/or immunotherapy agents VENOFER   & VITAMIN B12   To help prevent nausea and vomiting after your treatment, we encourage you to take your nausea medication as directed.  BELOW ARE SYMPTOMS THAT SHOULD BE REPORTED IMMEDIATELY: *FEVER GREATER THAN 100.4 F (38 C) OR HIGHER *CHILLS OR SWEATING *NAUSEA AND VOMITING THAT IS NOT CONTROLLED WITH YOUR NAUSEA MEDICATION *UNUSUAL SHORTNESS OF BREATH *UNUSUAL BRUISING OR BLEEDING *URINARY PROBLEMS (pain or burning when urinating, or frequent urination) *BOWEL PROBLEMS (unusual diarrhea, constipation, pain near the anus) TENDERNESS IN MOUTH AND THROAT WITH OR WITHOUT PRESENCE OF ULCERS (sore throat, sores in mouth, or a toothache) UNUSUAL RASH, SWELLING OR PAIN  UNUSUAL VAGINAL DISCHARGE OR ITCHING   Items with * indicate a potential emergency and should be followed up as soon as possible or go to the Emergency Department if any problems should occur.  Please show the CHEMOTHERAPY ALERT CARD or IMMUNOTHERAPY ALERT CARD at  check-in to the Emergency Department and triage nurse.  Should you have questions after your visit or need to cancel or reschedule your appointment, please contact [facility] CANCER [place] AT [place]  [phone] and follow the prompts.  Office hours are 8:00 a.m. to 4:30 p.m. Monday - Friday. Please note that voicemails left after 4:00 p.m. may not be returned until the following business day.  We are closed weekends and major holidays. You have access to a nurse at all times for urgent questions. Please call the main number to the clinic [phone] and follow the prompts.  For any non-urgent questions, you may also contact your provider using MyChart. We now offer e-Visits for anyone 77 and older to request care online for non-urgent symptoms. For details visit [email or web address].   Also download the MyChart app! Go to the app store, search "MyChart", open the app, select [facility], and log in with your MyChart username and password.  Due to Covid, a mask is required upon entering the hospital/clinic. If you do not have a mask, one will be given to you upon arrival. For doctor visits, patients may have 1 support person aged 47 or older with them. For treatment visits, patients cannot have anyone with them due to current Covid guidelines and our immunocompromised population.   Iron Sucrose Injection What is this medication? IRON SUCROSE (EYE ern SOO krose) treats low levels of iron (iron deficiency anemia) in people with kidney disease. Iron is a mineral that plays an important role in making red blood cells, which carry oxygen from your lungs to the rest of your body. This  medicine may be used for other purposes; ask your health care provider or pharmacist if you have questions. COMMON BRAND NAME(S): Venofer What should I tell my care team before I take this medication? They need to know if you have any of these conditions: Anemia not caused by low iron levels Heart  disease High levels of iron in the blood Kidney disease Liver disease An unusual or allergic reaction to iron, other medications, foods, dyes, or preservatives Pregnant or trying to get pregnant Breast-feeding How should I use this medication? This medication is for infusion into a vein. It is given in a hospital or clinic setting. Talk to your care team about the use of this medication in children. While this medication may be prescribed for children as young as 2 years for selected conditions, precautions do apply. Overdosage: If you think you have taken too much of this medicine contact a poison control center or emergency room at once. NOTE: This medicine is only for you. Do not share this medicine with others. What if I miss a dose? It is important not to miss your dose. Call your care team if you are unable to keep an appointment. What may interact with this medication? Do not take this medication with any of the following: Deferoxamine Dimercaprol Other iron products This medication may also interact with the following: Chloramphenicol Deferasirox This list may not describe all possible interactions. Give your health care provider a list of all the medicines, herbs, non-prescription drugs, or dietary supplements you use. Also tell them if you smoke, drink alcohol, or use illegal drugs. Some items may interact with your medicine. What should I watch for while using this medication? Visit your care team regularly. Tell your care team if your symptoms do not start to get better or if they get worse. You may need blood work done while you are taking this medication. You may need to follow a special diet. Talk to your care team. Foods that contain iron include: whole grains/cereals, dried fruits, beans, or peas, leafy green vegetables, and organ meats (liver, kidney). What side effects may I notice from receiving this medication? Side effects that you should report to your care team as  soon as possible: Allergic reactions--skin rash, itching, hives, swelling of the face, lips, tongue, or throat Low blood pressure--dizziness, feeling faint or lightheaded, blurry vision Shortness of breath Side effects that usually do not require medical attention (report to your care team if they continue or are bothersome): Flushing Headache Joint pain Muscle pain Nausea Pain, redness, or irritation at injection site This list may not describe all possible side effects. Call your doctor for medical advice about side effects. You may report side effects to FDA at 1-800-FDA-1088. Where should I keep my medication? This medication is given in a hospital or clinic and will not be stored at home. NOTE: This sheet is a summary. It may not cover all possible information. If you have questions about this medicine, talk to your doctor, pharmacist, or health care provider.  2023 Elsevier/Gold Standard (2020-08-03 00:00:00)  Vitamin B12 Injection What is this medication? Vitamin B12 (VAHY tuh min B12) prevents and treats low vitamin B12 levels in your body. It is used in people who do not get enough vitamin B12 from their diet or when their digestive tract does not absorb enough. Vitamin B12 plays an important role in maintaining the health of your nervous system and red blood cells. This medicine may be used for other purposes; ask  your health care provider or pharmacist if you have questions. COMMON BRAND NAME(S): B-12 Compliance Kit, B-12 Injection Kit, Cyomin, Dodex, LA-12, Nutri-Twelve, Physicians EZ Use B-12, Primabalt What should I tell my care team before I take this medication? They need to know if you have any of these conditions: Kidney disease Leber's disease Megaloblastic anemia An unusual or allergic reaction to cyanocobalamin, cobalt, other medications, foods, dyes, or preservatives Pregnant or trying to get pregnant Breast-feeding How should I use this medication? This  medication is injected into a muscle or deeply under the skin. It is usually given in a clinic or care team's office. However, your care team may teach you how to inject yourself. Follow all instructions. Talk to your care team about the use of this medication in children. Special care may be needed. Overdosage: If you think you have taken too much of this medicine contact a poison control center or emergency room at once. NOTE: This medicine is only for you. Do not share this medicine with others. What if I miss a dose? If you are given your dose at a clinic or care team's office, call to reschedule your appointment. If you give your own injections, and you miss a dose, take it as soon as you can. If it is almost time for your next dose, take only that dose. Do not take double or extra doses. What may interact with this medication? Alcohol Colchicine This list may not describe all possible interactions. Give your health care provider a list of all the medicines, herbs, non-prescription drugs, or dietary supplements you use. Also tell them if you smoke, drink alcohol, or use illegal drugs. Some items may interact with your medicine. What should I watch for while using this medication? Visit your care team regularly. You may need blood work done while you are taking this medication. You may need to follow a special diet. Talk to your care team. Limit your alcohol intake and avoid smoking to get the best benefit. What side effects may I notice from receiving this medication? Side effects that you should report to your care team as soon as possible: Allergic reactions--skin rash, itching, hives, swelling of the face, lips, tongue, or throat Swelling of the ankles, hands, or feet Trouble breathing Side effects that usually do not require medical attention (report to your care team if they continue or are bothersome): Diarrhea This list may not describe all possible side effects. Call your doctor for  medical advice about side effects. You may report side effects to FDA at 1-800-FDA-1088. Where should I keep my medication? Keep out of the reach of children. Store at room temperature between 15 and 30 degrees C (59 and 85 degrees F). Protect from light. Throw away any unused medication after the expiration date. NOTE: This sheet is a summary. It may not cover all possible information. If you have questions about this medicine, talk to your doctor, pharmacist, or health care provider.  2023 Elsevier/Gold Standard (2020-11-20 00:00:00)

## 2021-08-26 ENCOUNTER — Inpatient Hospital Stay: Payer: Managed Care, Other (non HMO)

## 2021-08-26 ENCOUNTER — Encounter: Payer: Self-pay | Admitting: Oncology

## 2021-08-26 ENCOUNTER — Other Ambulatory Visit: Payer: Self-pay | Admitting: Internal Medicine

## 2021-08-26 ENCOUNTER — Inpatient Hospital Stay: Payer: Managed Care, Other (non HMO) | Attending: Oncology | Admitting: Oncology

## 2021-08-26 VITALS — BP 152/54 | HR 52 | Temp 97.8°F | Resp 18 | Wt 310.6 lb

## 2021-08-26 DIAGNOSIS — I509 Heart failure, unspecified: Secondary | ICD-10-CM | POA: Diagnosis not present

## 2021-08-26 DIAGNOSIS — D696 Thrombocytopenia, unspecified: Secondary | ICD-10-CM | POA: Diagnosis not present

## 2021-08-26 DIAGNOSIS — I130 Hypertensive heart and chronic kidney disease with heart failure and stage 1 through stage 4 chronic kidney disease, or unspecified chronic kidney disease: Secondary | ICD-10-CM | POA: Diagnosis not present

## 2021-08-26 DIAGNOSIS — Z79899 Other long term (current) drug therapy: Secondary | ICD-10-CM | POA: Insufficient documentation

## 2021-08-26 DIAGNOSIS — Z87891 Personal history of nicotine dependence: Secondary | ICD-10-CM | POA: Diagnosis not present

## 2021-08-26 DIAGNOSIS — Z833 Family history of diabetes mellitus: Secondary | ICD-10-CM | POA: Diagnosis not present

## 2021-08-26 DIAGNOSIS — Z8249 Family history of ischemic heart disease and other diseases of the circulatory system: Secondary | ICD-10-CM | POA: Diagnosis not present

## 2021-08-26 DIAGNOSIS — G4733 Obstructive sleep apnea (adult) (pediatric): Secondary | ICD-10-CM | POA: Insufficient documentation

## 2021-08-26 DIAGNOSIS — Z800 Family history of malignant neoplasm of digestive organs: Secondary | ICD-10-CM | POA: Insufficient documentation

## 2021-08-26 DIAGNOSIS — R634 Abnormal weight loss: Secondary | ICD-10-CM | POA: Diagnosis not present

## 2021-08-26 DIAGNOSIS — N184 Chronic kidney disease, stage 4 (severe): Secondary | ICD-10-CM | POA: Diagnosis not present

## 2021-08-26 DIAGNOSIS — E538 Deficiency of other specified B group vitamins: Secondary | ICD-10-CM | POA: Diagnosis not present

## 2021-08-26 DIAGNOSIS — Z86718 Personal history of other venous thrombosis and embolism: Secondary | ICD-10-CM | POA: Insufficient documentation

## 2021-08-26 DIAGNOSIS — J449 Chronic obstructive pulmonary disease, unspecified: Secondary | ICD-10-CM | POA: Diagnosis not present

## 2021-08-26 DIAGNOSIS — R5383 Other fatigue: Secondary | ICD-10-CM | POA: Diagnosis not present

## 2021-08-26 DIAGNOSIS — Z832 Family history of diseases of the blood and blood-forming organs and certain disorders involving the immune mechanism: Secondary | ICD-10-CM | POA: Insufficient documentation

## 2021-08-26 DIAGNOSIS — I480 Paroxysmal atrial fibrillation: Secondary | ICD-10-CM | POA: Insufficient documentation

## 2021-08-26 DIAGNOSIS — Z7901 Long term (current) use of anticoagulants: Secondary | ICD-10-CM | POA: Insufficient documentation

## 2021-08-26 DIAGNOSIS — D631 Anemia in chronic kidney disease: Secondary | ICD-10-CM | POA: Diagnosis present

## 2021-08-26 DIAGNOSIS — D72819 Decreased white blood cell count, unspecified: Secondary | ICD-10-CM | POA: Insufficient documentation

## 2021-08-26 DIAGNOSIS — Z86711 Personal history of pulmonary embolism: Secondary | ICD-10-CM | POA: Diagnosis not present

## 2021-08-26 MED ORDER — CYANOCOBALAMIN 1000 MCG/ML IJ SOLN
1000.0000 ug | Freq: Once | INTRAMUSCULAR | Status: AC
  Administered 2021-08-26: 1000 ug via INTRAMUSCULAR

## 2021-08-26 NOTE — Progress Notes (Signed)
Hematology/Oncology Progress note [phone] [phone]            Patient Care Team: [name], MD as PCP - General (Internal Medicine) Bensimhon, Shaune Pascal, MD as PCP - Advanced Heart Failure (Cardiology) Bensimhon, Shaune Pascal, MD as PCP - Cardiology (Cardiology)  REFERRING PROVIDER: [name], MD  CHIEF COMPLAINTS/REASON FOR VISIT:  Evaluation of anemia of chronic kidney disease  HISTORY OF PRESENTING ILLNESS:   Phillip [name] is a  61 y.o.  male with PMH listed below was seen in consultation at the request of  [name] I, MD  for evaluation of anemia of chronic kidney disease.  Patient has chronic kidney disease and follows up with Dr. Candiss Norse.  Baseline creatinine 2.9. History of pulmonary embolism [2010], left lower extremity DVT[2017], atrial fibrillation, currently on Xarelto 20 mg daily.  Managed by cardiology/CHF clinic [facility].  Patient denies any melena, hematochezia.  07/16/2021 CBC showed hemoglobin 10.4, MCV 91, platelet count 10 9000, white count 2.6. Patient denies alcohol use.  He has intentionally lost weight. Patient was referred to establish care with hematology for further evaluation.  INTERVAL HISTORY Phillip [name] is a 61 y.o. male who has above history reviewed by me today presents for follow up visit for management of anemia due to chronic kidney disease, vitamin B12 deficiency. Patient had blood work done after last visit and present to discuss results. He has also been started on IV Venofer treatments deficiency.  He finishes 3 IV Venofer treatments as well as for vitamin B12 injections..  He tolerates well.  He feels that fatigue level has improved.  No other new complaints. Patient is on chronic anticoagulation with Xarelto for atrial fibrillation. Review of Systems  Constitutional:  Positive for fatigue. Negative for appetite change, chills, fever and unexpected weight change.  HENT:   Negative for hearing loss  and voice change.   Eyes:  Negative for eye problems and icterus.  Respiratory:  Negative for chest tightness, cough and shortness of breath.   Cardiovascular:  Negative for chest pain and leg swelling.  Gastrointestinal:  Negative for abdominal distention and abdominal pain.  Endocrine: Negative for hot flashes.  Genitourinary:  Negative for difficulty urinating, dysuria and frequency.   Musculoskeletal:  Negative for arthralgias.  Skin:  Negative for itching and rash.  Neurological:  Negative for light-headedness and numbness.  Hematological:  Negative for adenopathy. Does not bruise/bleed easily.  Psychiatric/Behavioral:  Negative for confusion.    MEDICAL HISTORY:  Past Medical History:  Diagnosis Date   Asthma    CHF (congestive heart failure) ([place]) 11/15   EF 40-45%, improved with follow-up   Chronic kidney disease    COPD (chronic obstructive pulmonary disease) (HCC)    Deep vein thrombosis (DVT) of lower extremity ([place]) 08/17/2015   DVT (deep venous thrombosis) (HCC)    Gout    Hypertension    Hypertensive cardiovascular disease    Kidney dysfunction    Morbid obesity (HCC)    OSA on CPAP    Paroxysmal atrial fibrillation (HCC)    PNA (pneumonia)    Pulmonary embolism ([place]) 2010   left leg DVT   Sleep apnea    uses cpap    SURGICAL HISTORY: Past Surgical History:  Procedure Laterality Date   A-FLUTTER ABLATION N/A 07/22/2017   Procedure: A-FLUTTER ABLATION;  Surgeon: Thompson Grayer, MD;  Location: [facility];  Service: Cardiovascular;  Laterality: N/A;   CARDIOVERSION N/A 10/14/2018   Procedure: CARDIOVERSION;  Surgeon: Jolaine Artist, MD;  Location: [facility];  Service: Cardiovascular;  Laterality: N/A;   CARDIOVERSION N/A 04/22/2019   Procedure: CARDIOVERSION;  Surgeon: Thayer Headings, MD;  Location: [facility];  Service: Cardiovascular;  Laterality: N/A;   CARDIOVERSION N/A 05/10/2019   Procedure: CARDIOVERSION;  Surgeon: Jolaine Artist, MD;   Location: [facility];  Service: Cardiovascular;  Laterality: N/A;   CARDIOVERSION N/A 11/23/2020   Procedure: CARDIOVERSION;  Surgeon: Pixie Casino, MD;  Location: [facility];  Service: Cardiovascular;  Laterality: N/A;   CARDIOVERSION N/A 04/16/2021   Procedure: CARDIOVERSION;  Surgeon: Berniece Salines, DO;  Location: [facility];  Service: Cardiovascular;  Laterality: N/A;   COLONOSCOPY WITH PROPOFOL N/A 12/20/2019   Procedure: COLONOSCOPY WITH PROPOFOL;  Surgeon: Yetta Flock, MD;  Location: [facility];  Service: Gastroenterology;  Laterality: N/A;   POLYPECTOMY  12/20/2019   Procedure: POLYPECTOMY;  Surgeon: Yetta Flock, MD;  Location: [facility];  Service: Gastroenterology;;   UMBILICAL HERNIA REPAIR  1992    SOCIAL HISTORY: Social History   Socioeconomic History   Marital status: Divorced    Spouse name: Not on file   Number of children: 2   Years of education: 12   Highest education level: Not on file  Occupational History   Occupation: Drives dump truck    Comment:     Occupation: Airline pilot    Comment: Disabled  Tobacco Use   Smoking status: Former    Packs/day: 0.25    Years: 1.00    Pack years: 0.25    Types: Cigarettes    Quit date: 07/23/1989    Years since quitting: 32.1   Smokeless tobacco: Never   Tobacco comments:    Former smoker 03/06/2021  Vaping Use   Vaping Use: Never used  Substance and Sexual Activity   Alcohol use: No    Alcohol/week: 0.0 standard drinks   Drug use: No   Sexual activity: Not Currently  Other Topics Concern   Not on file  Social History Narrative   Pt lives in [place], alone.   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   Has 2 sons in [place]      No living will   Would want sons to make decisions for him   Would accept resuscitation   Social Determinants of Health   Financial Resource Strain: Not on file  Food Insecurity: Not on file  Transportation Needs: Not on  file  Physical Activity: Not on file  Stress: Not on file  Social Connections: Not on file  Intimate Partner Violence: Not on file    FAMILY HISTORY: Family History  Problem Relation Age of Onset   Hypertension Mother    Diabetes Father    CAD Father    Clotting disorder Father    Heart disease Father        transplant   Stomach cancer Father    Allergies Brother    Colon cancer Neg Hx    Colon polyps Neg Hx    Esophageal cancer Neg Hx    Rectal cancer Neg Hx     ALLERGIES:  is allergic to other.  MEDICATIONS:  Current Outpatient Medications  Medication Sig Dispense Refill   albuterol (PROAIR HFA) 108 (90 Base) MCG/ACT inhaler Inhale 2 puffs into the lungs every 4 (four) hours as needed for wheezing or shortness of breath. (Patient taking differently: Inhale 2 puffs into the lungs every 4 (four) hours as needed  for shortness of breath.) 1 Inhaler 3   albuterol (PROVENTIL) (2.5 MG/3ML) 0.083% nebulizer solution INHALE 1 VIAL VIA NEBULIZER EVERY 6 HOURS AS NEEDED FOR WHEEZING/SHORTNESS OF BREATH. (Patient taking differently: Take 2.5 mg by nebulization every 6 (six) hours as needed for shortness of breath.) 375 mL 11   allopurinol (ZYLOPRIM) 300 MG tablet Take 1 tablet (300 mg total) by mouth daily. 90 tablet 0   amiodarone (PACERONE) 200 MG tablet Take 1 tablet (200 mg total) by mouth daily. 90 tablet 3   budesonide-formoterol (SYMBICORT) 80-4.5 MCG/ACT inhaler Inhale 2 puffs into the lungs 2 (two) times daily. 3 each 3   carvedilol (COREG) 6.25 MG tablet Take 2 tablets (12.5 mg total) by mouth 2 (two) times daily with a meal. 180 tablet 3   Cholecalciferol (VITAMIN D3) 50 MCG (2000 UT) TABS Take 2,000 Units by mouth daily.     colchicine 0.6 MG tablet Take 0.6 mg by mouth daily as needed (gout flares).      doxazosin (CARDURA) 2 MG tablet Take 1 tablet (2 mg total) by mouth daily. 30 tablet 0   hydrALAZINE (APRESOLINE) 100 MG tablet Take 1 tablet (100 mg total) by mouth every 8  (eight) hours. 90 tablet 0   isosorbide mononitrate (IMDUR) 120 MG 24 hr tablet Take 1 tablet (120 mg total) by mouth daily. 90 tablet 3   KLOR-CON M20 20 MEQ tablet TAKE 2 TABLETS (40 MEQ) EVERY MORNING AND 1 TABLET (20 MEQ) EVERY EVENING 270 tablet 3   oxymetazoline (AFRIN NASAL SPRAY) 0.05 % nasal spray Place 1 spray into both nostrils 2 (two) times daily as needed for congestion.     Probiotic Product (PROBIOTIC DAILY PO) Take 1 capsule by mouth daily.     torsemide (DEMADEX) 20 MG tablet Take 2 tablets (40 mg total) by mouth 2 (two) times daily. 360 tablet 3   XARELTO 20 MG TABS tablet TAKE 1 TABLET DAILY WITH SUPPER (NEEDS APPOINTMENT FOR FURTHER REFILLS) 90 tablet 3   No current facility-administered medications for this visit.     PHYSICAL EXAMINATION: ECOG PERFORMANCE STATUS: 1 - Symptomatic but completely ambulatory Vitals:   08/26/21 1010  BP: (!) 152/54  Pulse: (!) 52  Resp: 18  Temp: 97.8 F (36.6 C)   Filed Weights   08/26/21 1010  Weight: (!) 310 lb 9.6 oz (140.9 kg)    Physical Exam Constitutional:      General: He is not in acute distress.    Appearance: He is obese.  HENT:     Head: Normocephalic and atraumatic.  Eyes:     General: No scleral icterus. Cardiovascular:     Rate and Rhythm: Normal rate and regular rhythm.     Heart sounds: Normal heart sounds.  Pulmonary:     Effort: Pulmonary effort is normal. No respiratory distress.     Breath sounds: No wheezing.  Abdominal:     General: Bowel sounds are normal. There is no distension.     Palpations: Abdomen is soft.  Musculoskeletal:        General: No deformity. Normal range of motion.     Cervical back: Normal range of motion and neck supple.  Skin:    General: Skin is warm and dry.     Findings: No erythema or rash.  Neurological:     Mental Status: He is alert and oriented to person, place, and time. Mental status is at baseline.     Cranial Nerves: No cranial nerve deficit.  Psychiatric:         Mood and Affect: Mood normal.    LABORATORY DATA:  I have reviewed the data as listed Lab Results  Component Value Date   WBC 2.6 (L) 07/29/2021   HGB 10.3 (L) 07/29/2021   HCT 33.5 (L) 07/29/2021   MCV 91.5 07/29/2021   PLT 119 (L) 07/29/2021   Recent Labs    03/06/21 0856 04/08/21 1345 04/15/21 0500 04/16/21 0416 04/29/21 1050 05/09/21 1145 07/29/21 1125  NA 142   < > 142   < > 141 140 141  K 3.8   < > 3.5   < > 4.2 4.2 4.0  CL 104   < > 105   < > 105 104 105  CO2 30   < > 27   < > '28 29 31  ' GLUCOSE 116*   < > 79   < > 88 95 78  BUN 31*   < > 38*   < > 31* 30* 39*  CREATININE 2.82*   < > 2.90*   < > 2.93* 2.99* 3.08*  CALCIUM 8.8*   < > 8.5*   < > 8.8* 9.1 8.6*  GFRNONAA 25*   < > 24*   < > 24* 23* 22*  PROT 5.8*  --  5.0*  --   --   --  5.9*  ALBUMIN 3.3*  --  3.0*  --   --   --  3.5  AST 24  --  26  --   --   --  18  ALT 23  --  36  --   --   --  19  ALKPHOS 66  --  68  --   --   --  57  BILITOT 0.8  --  0.9  --   --   --  0.8   < > = values in this interval not displayed.    Iron/TIBC/Ferritin/ %Sat    Component Value Date/Time   IRON 78 07/29/2021 1125   IRON 74 03/23/2017 1133   TIBC 314 07/29/2021 1125   TIBC 334 03/23/2017 1133   FERRITIN 26 07/29/2021 1125   IRONPCTSAT 25 07/29/2021 1125   IRONPCTSAT 22 03/23/2017 1133      RADIOGRAPHIC STUDIES: I have personally reviewed the radiological images as listed and agreed with the findings in the report. No results found.    ASSESSMENT & PLAN:  1. Thrombocytopenia ([place])   2. B12 deficiency   3. Anemia of chronic renal failure, stage 4 (severe) (HCC)   4. Stage 4 chronic kidney disease (HCC)    #Anemia, likely secondary to chronic kidney disease As well as iron deficiency anemia. Status post IV Venofer weekly x3. Currently hemoglobin is above 10.  No need for erythropoietin replacement therapy.  #Thrombocytopenia, and leukopenia. Likely secondary to vitamin B12 deficiency.  Peripheral  blood flow cytometry is negative.  Multiple myeloma panel is negative. Status post vitamin B12 x4.  Recommend continue B12 injections monthly.   #Chronic anticoagulation in the setting of chronic kidney disease.  I encourage patient to discuss with cardiology regarding Xarelto use and chronic kidney disease.  Eliquis may be a better option of chronic kidney disease being patient with CKD.  -Forwarding my note to his cardiology.  Orders Placed This Encounter  Procedures   CBC with Differential/Platelet    Standing Status:   Future    Standing Expiration Date:   08/27/2022   Iron and TIBC  Standing Status:   Future    Standing Expiration Date:   08/27/2022   Ferritin    Standing Status:   Future    Standing Expiration Date:   08/27/2022   Vitamin B12    Standing Status:   Future    Standing Expiration Date:   08/27/2022   Anti-parietal antibody    Standing Status:   Future    Standing Expiration Date:   08/27/2022   Intrinsic Factor Antibodies    Standing Status:   Future    Standing Expiration Date:   08/27/2022    All questions were answered. The patient knows to call the clinic with any problems questions or concerns.  cc [name], MD  CC cardiology [place], Maricela Bo, FNP   Return of visit: 4 months Thank you for this kind referral and the opportunity to participate in the care of this patient. A copy of today's note is routed to referring provider   Earlie Server, MD, PhD [facility] Hematology Oncology 08/26/2021

## 2021-08-27 ENCOUNTER — Ambulatory Visit: Payer: Managed Care, Other (non HMO)

## 2021-09-16 ENCOUNTER — Other Ambulatory Visit: Payer: Self-pay | Admitting: Internal Medicine

## 2021-09-23 ENCOUNTER — Inpatient Hospital Stay: Payer: Managed Care, Other (non HMO) | Attending: Oncology

## 2021-10-21 ENCOUNTER — Inpatient Hospital Stay: Payer: Managed Care, Other (non HMO)

## 2021-10-23 ENCOUNTER — Other Ambulatory Visit (HOSPITAL_COMMUNITY): Payer: Self-pay | Admitting: *Deleted

## 2021-10-23 MED ORDER — AMIODARONE HCL 200 MG PO TABS: 200.0000 mg | ORAL_TABLET | Freq: Every day | ORAL | 1 refills | Status: DC

## 2021-10-24 ENCOUNTER — Ambulatory Visit (HOSPITAL_COMMUNITY)
Admission: RE | Admit: 2021-10-24 | Discharge: 2021-10-24 | Disposition: A | Discharge status: Home / Self Care | Payer: Managed Care, Other (non HMO) | Source: Ambulatory Visit | Attending: Nurse Practitioner | Admitting: Nurse Practitioner

## 2021-10-24 ENCOUNTER — Encounter (HOSPITAL_COMMUNITY): Payer: Self-pay | Admitting: Nurse Practitioner

## 2021-10-24 VITALS — BP 136/66 | HR 72 | Ht 66.5 in | Wt 308.8 lb

## 2021-10-24 DIAGNOSIS — N189 Chronic kidney disease, unspecified: Secondary | ICD-10-CM | POA: Diagnosis not present

## 2021-10-24 DIAGNOSIS — G4733 Obstructive sleep apnea (adult) (pediatric): Secondary | ICD-10-CM | POA: Diagnosis not present

## 2021-10-24 DIAGNOSIS — Z86718 Personal history of other venous thrombosis and embolism: Secondary | ICD-10-CM | POA: Insufficient documentation

## 2021-10-24 DIAGNOSIS — J449 Chronic obstructive pulmonary disease, unspecified: Secondary | ICD-10-CM | POA: Insufficient documentation

## 2021-10-24 DIAGNOSIS — M109 Gout, unspecified: Secondary | ICD-10-CM | POA: Diagnosis not present

## 2021-10-24 DIAGNOSIS — Z9989 Dependence on other enabling machines and devices: Secondary | ICD-10-CM | POA: Insufficient documentation

## 2021-10-24 DIAGNOSIS — Z87891 Personal history of nicotine dependence: Secondary | ICD-10-CM | POA: Insufficient documentation

## 2021-10-24 DIAGNOSIS — I4819 Other persistent atrial fibrillation: Secondary | ICD-10-CM | POA: Diagnosis not present

## 2021-10-24 DIAGNOSIS — Z79899 Other long term (current) drug therapy: Secondary | ICD-10-CM | POA: Insufficient documentation

## 2021-10-24 DIAGNOSIS — D6869 Other thrombophilia: Secondary | ICD-10-CM

## 2021-10-24 DIAGNOSIS — S7011XA Contusion of right thigh, initial encounter: Secondary | ICD-10-CM | POA: Insufficient documentation

## 2021-10-24 DIAGNOSIS — I480 Paroxysmal atrial fibrillation: Secondary | ICD-10-CM | POA: Diagnosis present

## 2021-10-24 DIAGNOSIS — Z7901 Long term (current) use of anticoagulants: Secondary | ICD-10-CM | POA: Diagnosis not present

## 2021-10-24 DIAGNOSIS — Z86711 Personal history of pulmonary embolism: Secondary | ICD-10-CM | POA: Diagnosis not present

## 2021-10-24 DIAGNOSIS — I130 Hypertensive heart and chronic kidney disease with heart failure and stage 1 through stage 4 chronic kidney disease, or unspecified chronic kidney disease: Secondary | ICD-10-CM | POA: Insufficient documentation

## 2021-10-24 DIAGNOSIS — Z7951 Long term (current) use of inhaled steroids: Secondary | ICD-10-CM | POA: Diagnosis not present

## 2021-10-24 DIAGNOSIS — I509 Heart failure, unspecified: Secondary | ICD-10-CM | POA: Diagnosis not present

## 2021-10-24 NOTE — Addendum Note (Signed)
Encounter addended by: Enid Derry, CMA on: 10/24/2021 2:24 PM  Actions taken: Order list changed
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
## 2021-10-29 ENCOUNTER — Ambulatory Visit (HOSPITAL_COMMUNITY)
Admission: RE | Admit: 2021-10-29 | Discharge: 2021-10-29 | Disposition: A | Discharge status: Home / Self Care | Payer: Managed Care, Other (non HMO) | Source: Ambulatory Visit | Attending: Nurse Practitioner | Admitting: Nurse Practitioner

## 2021-10-29 VITALS — BP 140/76 | HR 74

## 2021-10-29 DIAGNOSIS — I480 Paroxysmal atrial fibrillation: Secondary | ICD-10-CM | POA: Insufficient documentation

## 2021-10-29 NOTE — Progress Notes (Signed)
In for ekg. Remains in rate controlled afib. He is not sure if he is paroxysmal or persistent.will place a one week zio patch and if persistent will plan on cardioversion. His bruising of rt leg much improved and plans to go back to work.  Further plans based on monitor results. Will be in touch after results are known.

## 2021-11-12 ENCOUNTER — Telehealth (HOSPITAL_COMMUNITY): Payer: Self-pay | Admitting: *Deleted

## 2021-11-12 ENCOUNTER — Encounter (HOSPITAL_COMMUNITY): Payer: Self-pay | Admitting: *Deleted

## 2021-11-12 NOTE — Telephone Encounter (Signed)
notified

## 2021-11-12 NOTE — Telephone Encounter (Signed)
-----   Message from Sherran Needs, NP sent at 11/11/2021  4:06 PM EDT ----- Please let pt know that majority of time he is in SR, some afib but hopefully will get better as he recovers from  ablation

## 2021-11-26 ENCOUNTER — Inpatient Hospital Stay: Payer: Managed Care, Other (non HMO) | Attending: Oncology

## 2021-11-26 DIAGNOSIS — Z87891 Personal history of nicotine dependence: Secondary | ICD-10-CM | POA: Diagnosis not present

## 2021-11-26 DIAGNOSIS — Z8249 Family history of ischemic heart disease and other diseases of the circulatory system: Secondary | ICD-10-CM | POA: Diagnosis not present

## 2021-11-26 DIAGNOSIS — Z79899 Other long term (current) drug therapy: Secondary | ICD-10-CM | POA: Insufficient documentation

## 2021-11-26 DIAGNOSIS — R634 Abnormal weight loss: Secondary | ICD-10-CM | POA: Insufficient documentation

## 2021-11-26 DIAGNOSIS — I509 Heart failure, unspecified: Secondary | ICD-10-CM | POA: Diagnosis not present

## 2021-11-26 DIAGNOSIS — N184 Chronic kidney disease, stage 4 (severe): Secondary | ICD-10-CM | POA: Insufficient documentation

## 2021-11-26 DIAGNOSIS — D696 Thrombocytopenia, unspecified: Secondary | ICD-10-CM | POA: Insufficient documentation

## 2021-11-26 DIAGNOSIS — D631 Anemia in chronic kidney disease: Secondary | ICD-10-CM | POA: Insufficient documentation

## 2021-11-26 DIAGNOSIS — J449 Chronic obstructive pulmonary disease, unspecified: Secondary | ICD-10-CM | POA: Insufficient documentation

## 2021-11-26 DIAGNOSIS — I130 Hypertensive heart and chronic kidney disease with heart failure and stage 1 through stage 4 chronic kidney disease, or unspecified chronic kidney disease: Secondary | ICD-10-CM | POA: Insufficient documentation

## 2021-11-26 DIAGNOSIS — G4733 Obstructive sleep apnea (adult) (pediatric): Secondary | ICD-10-CM | POA: Diagnosis not present

## 2021-11-26 DIAGNOSIS — Z833 Family history of diabetes mellitus: Secondary | ICD-10-CM | POA: Insufficient documentation

## 2021-11-26 DIAGNOSIS — Z86718 Personal history of other venous thrombosis and embolism: Secondary | ICD-10-CM | POA: Diagnosis not present

## 2021-11-26 DIAGNOSIS — Z832 Family history of diseases of the blood and blood-forming organs and certain disorders involving the immune mechanism: Secondary | ICD-10-CM | POA: Diagnosis not present

## 2021-11-26 DIAGNOSIS — E538 Deficiency of other specified B group vitamins: Secondary | ICD-10-CM | POA: Diagnosis not present

## 2021-11-26 DIAGNOSIS — Z7901 Long term (current) use of anticoagulants: Secondary | ICD-10-CM | POA: Insufficient documentation

## 2021-11-26 DIAGNOSIS — I480 Paroxysmal atrial fibrillation: Secondary | ICD-10-CM | POA: Insufficient documentation

## 2021-11-26 DIAGNOSIS — Z800 Family history of malignant neoplasm of digestive organs: Secondary | ICD-10-CM | POA: Diagnosis not present

## 2021-11-26 DIAGNOSIS — Z86711 Personal history of pulmonary embolism: Secondary | ICD-10-CM | POA: Insufficient documentation

## 2021-11-26 LAB — IRON AND TIBC
Iron: 51 ug/dL (ref 45–182)
Saturation Ratios: 18 % (ref 17.9–39.5)
TIBC: 287 ug/dL (ref 250–450)
UIBC: 236 ug/dL

## 2021-11-26 LAB — CBC WITH DIFFERENTIAL/PLATELET
Abs Immature Granulocytes: 0.03 10*3/uL (ref 0.00–0.07)
Basophils Absolute: 0 10*3/uL (ref 0.0–0.1)
Basophils Relative: 1 %
Eosinophils Absolute: 0 10*3/uL (ref 0.0–0.5)
Eosinophils Relative: 1 %
HCT: 35.6 % — ABNORMAL LOW (ref 39.0–52.0)
Hemoglobin: 11.1 g/dL — ABNORMAL LOW (ref 13.0–17.0)
Immature Granulocytes: 1 %
Lymphocytes Relative: 21 %
Lymphs Abs: 0.6 10*3/uL — ABNORMAL LOW (ref 0.7–4.0)
MCH: 29.2 pg (ref 26.0–34.0)
MCHC: 31.2 g/dL (ref 30.0–36.0)
MCV: 93.7 fL (ref 80.0–100.0)
Monocytes Absolute: 0.4 10*3/uL (ref 0.1–1.0)
Monocytes Relative: 12 %
Neutro Abs: 1.8 10*3/uL (ref 1.7–7.7)
Neutrophils Relative %: 64 %
Platelets: 134 10*3/uL — ABNORMAL LOW (ref 150–400)
RBC: 3.8 MIL/uL — ABNORMAL LOW (ref 4.22–5.81)
RDW: 15.3 % (ref 11.5–15.5)
WBC: 2.8 10*3/uL — ABNORMAL LOW (ref 4.0–10.5)
nRBC: 0 % (ref 0.0–0.2)

## 2021-11-26 LAB — FERRITIN: Ferritin: 69 ng/mL (ref 24–336)

## 2021-11-26 LAB — VITAMIN B12: Vitamin B-12: 358 pg/mL (ref 180–914)

## 2021-11-27 LAB — INTRINSIC FACTOR ANTIBODIES: Intrinsic Factor: 1.1 AU/mL (ref 0.0–1.1)

## 2021-11-27 LAB — ANTI-PARIETAL ANTIBODY: Parietal Cell Antibody-IgG: 1.5 Units (ref 0.0–20.0)

## 2021-11-27 MED FILL — Iron Sucrose Inj 20 MG/ML (Fe Equiv): INTRAVENOUS | Qty: 10 | Status: AC

## 2021-11-28 ENCOUNTER — Inpatient Hospital Stay (HOSPITAL_BASED_OUTPATIENT_CLINIC_OR_DEPARTMENT_OTHER): Payer: Managed Care, Other (non HMO) | Admitting: Oncology

## 2021-11-28 ENCOUNTER — Encounter: Payer: Self-pay | Admitting: Oncology

## 2021-11-28 ENCOUNTER — Inpatient Hospital Stay: Payer: Managed Care, Other (non HMO)

## 2021-11-28 VITALS — BP 154/60 | HR 49

## 2021-11-28 VITALS — BP 165/60 | HR 54 | Temp 97.5°F | Resp 18 | Wt 304.8 lb

## 2021-11-28 DIAGNOSIS — I130 Hypertensive heart and chronic kidney disease with heart failure and stage 1 through stage 4 chronic kidney disease, or unspecified chronic kidney disease: Secondary | ICD-10-CM | POA: Diagnosis not present

## 2021-11-28 DIAGNOSIS — D72818 Other decreased white blood cell count: Secondary | ICD-10-CM

## 2021-11-28 DIAGNOSIS — N184 Chronic kidney disease, stage 4 (severe): Secondary | ICD-10-CM | POA: Diagnosis not present

## 2021-11-28 DIAGNOSIS — E538 Deficiency of other specified B group vitamins: Secondary | ICD-10-CM

## 2021-11-28 DIAGNOSIS — D696 Thrombocytopenia, unspecified: Secondary | ICD-10-CM

## 2021-11-28 DIAGNOSIS — D631 Anemia in chronic kidney disease: Secondary | ICD-10-CM

## 2021-11-28 MED ORDER — SODIUM CHLORIDE 0.9 % IV SOLN
200.0000 mg | Freq: Once | INTRAVENOUS | Status: AC
  Administered 2021-11-28: 200 mg via INTRAVENOUS
  Filled 2021-11-28: qty 10

## 2021-11-28 MED ORDER — SODIUM CHLORIDE 0.9 % IV SOLN
Freq: Once | INTRAVENOUS | Status: AC
  Filled 2021-11-28: qty 250

## 2021-11-28 MED ORDER — CYANOCOBALAMIN 1000 MCG/ML IJ SOLN
1000.0000 ug | Freq: Once | INTRAMUSCULAR | Status: AC
  Administered 2021-11-28: 1000 ug via INTRAMUSCULAR

## 2021-11-28 NOTE — Progress Notes (Signed)
Pt here for follow up. No new concerns voiced.   

## 2021-11-28 NOTE — Progress Notes (Signed)
Hematology/Oncology Progress note [phone] [phone]            Patient Care Team: [name], MD as PCP - General (Internal Medicine) Bensimhon, Shaune Pascal, MD as PCP - Advanced Heart Failure (Cardiology) Bensimhon, Shaune Pascal, MD as PCP - Cardiology (Cardiology)  REFERRING PROVIDER: [name], MD  CHIEF COMPLAINTS/REASON FOR VISIT:  anemia of chronic kidney disease, B12 deficiency.  HISTORY OF PRESENTING ILLNESS:   Phillip [name] is a  61 y.o.  male with PMH listed below was seen in consultation at the request of  [name] I, MD  for evaluation of anemia of chronic kidney disease.  Patient has chronic kidney disease and follows up with Dr. Candiss Norse.  Baseline creatinine 2.9. History of pulmonary embolism [2010], left lower extremity DVT[2017], atrial fibrillation, currently on Xarelto 20 mg daily.  Managed by cardiology/CHF clinic [place].  Patient denies any melena, hematochezia.  07/16/2021 CBC showed hemoglobin 10.4, MCV 91, platelet count 10 9000, white count 2.6. Patient denies alcohol use.  He has intentionally lost weight. Patient was referred to establish care with hematology for further evaluation.  INTERVAL HISTORY Phillip [name] is a 61 y.o. male who has above history reviewed by me today presents for follow up visit for management of anemia due to chronic kidney disease, vitamin B12 deficiency. Patient reports feeling well. He is on Xarelto 20 mg for A-fib.  Follows up with [facility] cardiology. Chronic kidney disease.  Follows up with nephrology. Fatigue level has improved.  No other new complaints.  He denies bleeding events.  Review of Systems  Constitutional:  Negative for appetite change, chills, fatigue, fever and unexpected weight change.  HENT:   Negative for hearing loss and voice change.   Eyes:  Negative for eye problems and icterus.  Respiratory:  Negative for chest tightness, cough and shortness of breath.    Cardiovascular:  Negative for chest pain and leg swelling.  Gastrointestinal:  Negative for abdominal distention and abdominal pain.  Endocrine: Negative for hot flashes.  Genitourinary:  Negative for difficulty urinating, dysuria and frequency.   Musculoskeletal:  Negative for arthralgias.  Skin:  Negative for itching and rash.  Neurological:  Negative for light-headedness and numbness.  Hematological:  Negative for adenopathy. Does not bruise/bleed easily.  Psychiatric/Behavioral:  Negative for confusion.     MEDICAL HISTORY:  Past Medical History:  Diagnosis Date   Asthma    CHF (congestive heart failure) ([place]) 11/15   EF 40-45%, improved with follow-up   Chronic kidney disease    COPD (chronic obstructive pulmonary disease) (HCC)    Deep vein thrombosis (DVT) of lower extremity ([place]) 08/17/2015   DVT (deep venous thrombosis) (HCC)    Gout    Hypertension    Hypertensive cardiovascular disease    Kidney dysfunction    Morbid obesity (HCC)    OSA on CPAP    Paroxysmal atrial fibrillation (HCC)    PNA (pneumonia)    Pulmonary embolism ([place]) 2010   left leg DVT   Sleep apnea    uses cpap    SURGICAL HISTORY: Past Surgical History:  Procedure Laterality Date   A-FLUTTER ABLATION N/A 07/22/2017   Procedure: A-FLUTTER ABLATION;  Surgeon: Thompson Grayer, MD;  Location: [place] [facility];  Service: Cardiovascular;  Laterality: N/A;   CARDIOVERSION N/A 10/14/2018   Procedure: CARDIOVERSION;  Surgeon: Jolaine Artist, MD;  Location: [facility] ENDOSCOPY;  Service: Cardiovascular;  Laterality: N/A;   CARDIOVERSION N/A 04/22/2019   Procedure: CARDIOVERSION;  Surgeon: Acie Fredrickson Wonda Cheng, MD;  Location: [place];  Service: Cardiovascular;  Laterality: N/A;   CARDIOVERSION N/A 05/10/2019   Procedure: CARDIOVERSION;  Surgeon: Jolaine Artist, MD;  Location: [place];  Service: Cardiovascular;  Laterality: N/A;   CARDIOVERSION N/A 11/23/2020   Procedure: CARDIOVERSION;  Surgeon:  Pixie Casino, MD;  Location: [place];  Service: Cardiovascular;  Laterality: N/A;   CARDIOVERSION N/A 04/16/2021   Procedure: CARDIOVERSION;  Surgeon: Berniece Salines, DO;  Location: [place];  Service: Cardiovascular;  Laterality: N/A;   COLONOSCOPY WITH PROPOFOL N/A 12/20/2019   Procedure: COLONOSCOPY WITH PROPOFOL;  Surgeon: Yetta Flock, MD;  Location: [facility] ENDOSCOPY;  Service: Gastroenterology;  Laterality: N/A;   POLYPECTOMY  12/20/2019   Procedure: POLYPECTOMY;  Surgeon: Yetta Flock, MD;  Location: [facility] ENDOSCOPY;  Service: Gastroenterology;;   UMBILICAL HERNIA REPAIR  1992    SOCIAL HISTORY: Social History   Socioeconomic History   Marital status: Divorced    Spouse name: Not on file   Number of children: 2   Years of education: 12   Highest education level: Not on file  Occupational History   Occupation: Drives dump truck    Comment:     Occupation: Airline pilot    Comment: Disabled  Tobacco Use   Smoking status: Former    Packs/day: 0.25    Years: 1.00    Total pack years: 0.25    Types: Cigarettes    Quit date: 07/23/1989    Years since quitting: 32.3   Smokeless tobacco: Never   Tobacco comments:    Former smoker 03/06/2021  Vaping Use   Vaping Use: Never used  Substance and Sexual Activity   Alcohol use: No    Alcohol/week: 0.0 standard drinks of alcohol   Drug use: No   Sexual activity: Not Currently  Other Topics Concern   Not on file  Social History Narrative   Pt lives in [place], alone.   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   Has 2 sons in [place]      No living will   Would want sons to make decisions for him   Would accept resuscitation   Social Determinants of Health   Financial Resource Strain: Low Risk  (01/19/2018)   Overall Financial Resource Strain (CARDIA)    Difficulty of Paying Living Expenses: Not hard at all  Food Insecurity: No Food Insecurity (01/19/2018)   Hunger Vital  Sign    Worried About Running Out of Food in the Last Year: Never true    Ran Out of Food in the Last Year: Never true  Transportation Needs: No Transportation Needs (01/19/2018)   PRAPARE - Hydrologist (Medical): No    Lack of Transportation (Non-Medical): No  Physical Activity: Not on file  Stress: Not on file  Social Connections: Not on file  Intimate Partner Violence: Not on file    FAMILY HISTORY: Family History  Problem Relation Age of Onset   Hypertension Mother    Diabetes Father    CAD Father    Clotting disorder Father    Heart disease Father        transplant   Stomach cancer Father    Allergies Brother    Colon cancer Neg Hx    Colon polyps Neg Hx    Esophageal cancer Neg Hx    Rectal cancer Neg Hx     ALLERGIES:  is allergic to other.  MEDICATIONS:  Current Outpatient Medications  Medication Sig Dispense Refill   albuterol (PROAIR HFA) 108 (90 Base) MCG/ACT inhaler Inhale 2 puffs into the lungs every 4 (four) hours as needed for wheezing or shortness of breath. (Patient taking differently: Inhale 2 puffs into the lungs every 4 (four) hours as needed for shortness of breath.) 1 Inhaler 3   albuterol (PROVENTIL) (2.5 MG/3ML) 0.083% nebulizer solution INHALE 1 VIAL VIA NEBULIZER EVERY 6 HOURS AS NEEDED FOR WHEEZING/SHORTNESS OF BREATH. (Patient taking differently: Take 2.5 mg by nebulization every 6 (six) hours as needed for shortness of breath.) 375 mL 11   allopurinol (ZYLOPRIM) 300 MG tablet Take 1 tablet (300 mg total) by mouth daily. 90 tablet 0   amiodarone (PACERONE) 200 MG tablet Take 1 tablet (200 mg total) by mouth daily. 90 tablet 1   budesonide-formoterol (SYMBICORT) 80-4.5 MCG/ACT inhaler Inhale 2 puffs into the lungs 2 (two) times daily. 3 each 3   carvedilol (COREG) 6.25 MG tablet Take 2 tablets (12.5 mg total) by mouth 2 (two) times daily with a meal. 180 tablet 3   Cholecalciferol (VITAMIN D3) 50 MCG (2000 UT) TABS Take  2,000 Units by mouth daily.     hydrALAZINE (APRESOLINE) 100 MG tablet Take 1 tablet (100 mg total) by mouth every 8 (eight) hours. 90 tablet 0   isosorbide mononitrate (IMDUR) 120 MG 24 hr tablet Take 1 tablet (120 mg total) by mouth daily. 90 tablet 3   KLOR-CON M20 20 MEQ tablet TAKE 2 TABLETS (40 MEQ) EVERY MORNING AND 1 TABLET (20 MEQ) EVERY EVENING 270 tablet 3   oxymetazoline (AFRIN NASAL SPRAY) 0.05 % nasal spray Place 1 spray into both nostrils 2 (two) times daily as needed for congestion.     Potassium Chloride ER 20 MEQ TBCR 2 tabs AM 1 tab PM     Probiotic Product (PROBIOTIC DAILY PO) Take 1 capsule by mouth daily.     sucralfate (CARAFATE) 1 g tablet Take 1 g by mouth 4 (four) times daily.     torsemide (DEMADEX) 20 MG tablet Take 2 tablets (40 mg total) by mouth 2 (two) times daily. 360 tablet 3   XARELTO 20 MG TABS tablet TAKE 1 TABLET DAILY WITH SUPPER (NEEDS APPOINTMENT FOR FURTHER REFILLS) 90 tablet 3   colchicine 0.6 MG tablet Take 0.6 mg by mouth daily as needed (gout flares).  (Patient not taking: Reported on 11/28/2021)     No current facility-administered medications for this visit.     PHYSICAL EXAMINATION: ECOG PERFORMANCE STATUS: 1 - Symptomatic but completely ambulatory Vitals:   11/28/21 1412  BP: (!) 165/60  Pulse: (!) 54  Resp: 18  Temp: (!) 97.5 F (36.4 C)   Filed Weights   11/28/21 1412  Weight: (!) 304 lb 12.8 oz (138.3 kg)    Physical Exam Constitutional:      General: He is not in acute distress.    Appearance: He is obese.  HENT:     Head: Normocephalic and atraumatic.  Eyes:     General: No scleral icterus. Cardiovascular:     Rate and Rhythm: Normal rate and regular rhythm.     Heart sounds: Normal heart sounds.  Pulmonary:     Effort: Pulmonary effort is normal. No respiratory distress.     Breath sounds: No wheezing.  Abdominal:     General: Bowel sounds are normal. There is no distension.     Palpations: Abdomen is soft.   Musculoskeletal:  General: No deformity. Normal range of motion.     Cervical back: Normal range of motion and neck supple.  Skin:    General: Skin is warm and dry.     Findings: No erythema or rash.  Neurological:     Mental Status: He is alert and oriented to person, place, and time. Mental status is at baseline.     Cranial Nerves: No cranial nerve deficit.  Psychiatric:        Mood and Affect: Mood normal.     LABORATORY DATA:  I have reviewed the data as listed    Latest Ref Rng & Units 11/26/2021    7:51 AM 07/29/2021   11:25 AM 04/16/2021    4:16 AM  CBC  WBC 4.0 - 10.5 K/uL 2.8  2.6  3.2   Hemoglobin 13.0 - 17.0 g/dL 11.1  10.3  10.7   Hematocrit 39.0 - 52.0 % 35.6  33.5  34.7   Platelets 150 - 400 K/uL 134  119  111     Recent Labs    03/06/21 0856 04/08/21 1345 04/15/21 0500 04/16/21 0416 04/29/21 1050 05/09/21 1145 07/29/21 1125  NA 142   < > 142   < > 141 140 141  K 3.8   < > 3.5   < > 4.2 4.2 4.0  CL 104   < > 105   < > 105 104 105  CO2 30   < > 27   < > _0 GLUCOSE 116*   < > 79   < > 88 95 78  BUN 31*   < > 38*   < > 31* 30* 39*  CREATININE 2.82*   < > 2.90*   < > 2.93* 2.99* 3.08*  CALCIUM 8.8*   < > 8.5*   < > 8.8* 9.1 8.6*  GFRNONAA 25*   < > 24*   < > 24* 23* 22*  PROT 5.8*  --  5.0*  --   --   --  5.9*  ALBUMIN 3.3*  --  3.0*  --   --   --  3.5  AST 24  --  26  --   --   --  18  ALT 23  --  36  --   --   --  19  ALKPHOS 66  --  68  --   --   --  57  BILITOT 0.8  --  0.9  --   --   --  0.8   < > = values in this interval not displayed.    Iron/TIBC/Ferritin/ %Sat    Component Value Date/Time   IRON 51 11/26/2021 0751   IRON 74 03/23/2017 1133   TIBC 287 11/26/2021 0751   TIBC 334 03/23/2017 1133   FERRITIN 69 11/26/2021 0751   IRONPCTSAT 18 11/26/2021 0751   IRONPCTSAT 22 03/23/2017 1133      RADIOGRAPHIC STUDIES: I have personally reviewed the radiological images as listed and agreed with the findings in the  report. LONG TERM MONITOR (3-14 DAYS)  Result Date: 11/11/2021 Patch Wear Time:  6 days and 16 hours Predominant rhythm was sinus rhythm 14% atrial fibrillation burden Less than 1% ventricular and supraventricular ectopy Triggered episodes associated with sinus rhythm Will Camnitz, MD      ASSESSMENT & PLAN:  1. B12 deficiency   2. Anemia of chronic renal failure, stage 4 (severe) (HCC)   3. Thrombocytopenia ([place])   4. Other  decreased white blood cell (WBC) count    #Anemia, likely secondary to chronic kidney disease Labs reviewed and discussed with patient. Hemoglobin has improved.  No need for erythropoietin replacement therapy. Ferritin 69, recommend IV Venofer x 2 doses to further improve iron store.   #Vitamin B12 deficiency . thrombocytopenia, and leukopenia,'s counts are stable.  Platelet count has slightly improved.  Neutrophil level has improved.  previous work-up peripheral blood flow cytometry is negative.  Multiple myeloma panel is negative. Continue vitamin B12 injection monthly.   #Chronic anticoagulation in the setting of chronic kidney disease.  I encourage patient to discuss with cardiology regarding Xarelto use and chronic kidney disease.  Eliquis may be a better option of chronic kidney disease being patient with CKD.  -Forwarding my note to his cardiology.  Orders Placed This Encounter  Procedures   CBC with Differential/Platelet    Standing Status:   Future    Standing Expiration Date:   11/29/2022   Ferritin    Standing Status:   Future    Standing Expiration Date:   11/29/2022   Iron and TIBC    Standing Status:   Future    Standing Expiration Date:   11/29/2022   Vitamin B12    Standing Status:   Future    Standing Expiration Date:   11/29/2022    All questions were answered. The patient knows to call the clinic with any problems questions or concerns.  cc [name], MD  CC cardiology [place], Maricela Bo, FNP   Return of visit: B12 injection  today IV venofer today, repeat IV venofer in 2 weeks  B12 monthly x 3.  4 months, labs prior to MD +/- B12 injection/Venofer iron labs + b12  Thank you for this kind referral and the opportunity to participate in the care of this patient. A copy of today's note is routed to referring provider   Earlie Server, MD, PhD [facility] Hematology Oncology 11/28/2021

## 2021-12-04 ENCOUNTER — Other Ambulatory Visit: Payer: Self-pay | Admitting: Internal Medicine

## 2021-12-11 ENCOUNTER — Ambulatory Visit (INDEPENDENT_AMBULATORY_CARE_PROVIDER_SITE_OTHER): Payer: Managed Care, Other (non HMO) | Admitting: *Deleted

## 2021-12-11 DIAGNOSIS — Z23 Encounter for immunization: Secondary | ICD-10-CM

## 2021-12-12 ENCOUNTER — Inpatient Hospital Stay: Payer: Managed Care, Other (non HMO)

## 2021-12-12 VITALS — BP 145/56 | HR 52 | Temp 96.5°F | Resp 18

## 2021-12-12 DIAGNOSIS — I130 Hypertensive heart and chronic kidney disease with heart failure and stage 1 through stage 4 chronic kidney disease, or unspecified chronic kidney disease: Secondary | ICD-10-CM | POA: Diagnosis not present

## 2021-12-12 DIAGNOSIS — E538 Deficiency of other specified B group vitamins: Secondary | ICD-10-CM

## 2021-12-12 MED ORDER — SODIUM CHLORIDE 0.9 % IV SOLN
Freq: Once | INTRAVENOUS | Status: AC
  Filled 2021-12-12: qty 250

## 2021-12-12 MED ORDER — SODIUM CHLORIDE 0.9 % IV SOLN
200.0000 mg | Freq: Once | INTRAVENOUS | Status: AC
  Administered 2021-12-12: 200 mg via INTRAVENOUS
  Filled 2021-12-12: qty 200

## 2021-12-30 ENCOUNTER — Inpatient Hospital Stay: Payer: Managed Care, Other (non HMO) | Attending: Oncology

## 2021-12-30 DIAGNOSIS — Z79899 Other long term (current) drug therapy: Secondary | ICD-10-CM | POA: Diagnosis not present

## 2021-12-30 DIAGNOSIS — N184 Chronic kidney disease, stage 4 (severe): Secondary | ICD-10-CM | POA: Insufficient documentation

## 2021-12-30 DIAGNOSIS — D631 Anemia in chronic kidney disease: Secondary | ICD-10-CM | POA: Diagnosis present

## 2021-12-30 DIAGNOSIS — E538 Deficiency of other specified B group vitamins: Secondary | ICD-10-CM | POA: Diagnosis not present

## 2021-12-30 MED ORDER — CYANOCOBALAMIN 1000 MCG/ML IJ SOLN
1000.0000 ug | Freq: Once | INTRAMUSCULAR | Status: AC
  Administered 2021-12-30: 1000 ug via INTRAMUSCULAR
  Filled 2021-12-30: qty 1

## 2022-01-12 ENCOUNTER — Other Ambulatory Visit: Payer: Self-pay

## 2022-01-12 ENCOUNTER — Encounter (HOSPITAL_COMMUNITY): Payer: Self-pay | Admitting: Emergency Medicine

## 2022-01-12 ENCOUNTER — Emergency Department (HOSPITAL_COMMUNITY)
Admission: EM | Admit: 2022-01-12 | Discharge: 2022-01-13 | Disposition: A | Discharge status: Home / Self Care | Payer: Managed Care, Other (non HMO) | Attending: Emergency Medicine | Admitting: Emergency Medicine

## 2022-01-12 ENCOUNTER — Emergency Department (HOSPITAL_COMMUNITY): Payer: Managed Care, Other (non HMO)

## 2022-01-12 DIAGNOSIS — I5020 Unspecified systolic (congestive) heart failure: Secondary | ICD-10-CM

## 2022-01-12 DIAGNOSIS — J810 Acute pulmonary edema: Secondary | ICD-10-CM

## 2022-01-12 DIAGNOSIS — R0602 Shortness of breath: Secondary | ICD-10-CM | POA: Diagnosis present

## 2022-01-12 DIAGNOSIS — N184 Chronic kidney disease, stage 4 (severe): Secondary | ICD-10-CM | POA: Diagnosis not present

## 2022-01-12 LAB — BASIC METABOLIC PANEL
Anion gap: 8 (ref 5–15)
BUN: 31 mg/dL — ABNORMAL HIGH (ref 6–20)
CO2: 29 mmol/L (ref 22–32)
Calcium: 8.9 mg/dL (ref 8.9–10.3)
Chloride: 103 mmol/L (ref 98–111)
Creatinine, Ser: 2.78 mg/dL — ABNORMAL HIGH (ref 0.61–1.24)
GFR, Estimated: 25 mL/min — ABNORMAL LOW (ref >60)
Glucose, Bld: 94 mg/dL (ref 70–99)
Potassium: 3.8 mmol/L (ref 3.5–5.1)
Sodium: 140 mmol/L (ref 135–145)

## 2022-01-12 LAB — CBC
HCT: 35.6 % — ABNORMAL LOW (ref 39.0–52.0)
Hemoglobin: 10.7 g/dL — ABNORMAL LOW (ref 13.0–17.0)
MCH: 28.8 pg (ref 26.0–34.0)
MCHC: 30.1 g/dL (ref 30.0–36.0)
MCV: 96 fL (ref 80.0–100.0)
Platelets: 137 10*3/uL — ABNORMAL LOW (ref 150–400)
RBC: 3.71 MIL/uL — ABNORMAL LOW (ref 4.22–5.81)
RDW: 15.8 % — ABNORMAL HIGH (ref 11.5–15.5)
WBC: 2.9 10*3/uL — ABNORMAL LOW (ref 4.0–10.5)
nRBC: 0 % (ref 0.0–0.2)

## 2022-01-12 LAB — TROPONIN I (HIGH SENSITIVITY): Troponin I (High Sensitivity): 45 ng/L — ABNORMAL HIGH (ref <18)

## 2022-01-12 LAB — BRAIN NATRIURETIC PEPTIDE: B Natriuretic Peptide: 1751.9 pg/mL — ABNORMAL HIGH (ref 0.0–100.0)

## 2022-01-12 MED ORDER — FUROSEMIDE 10 MG/ML IJ SOLN
80.0000 mg | Freq: Once | INTRAMUSCULAR | Status: AC
  Administered 2022-01-12: 80 mg via INTRAVENOUS
  Filled 2022-01-12: qty 8

## 2022-01-12 NOTE — Discharge Instructions (Signed)
Call Monday for soonest appointment with heart failure team. Take 1 extra torsemide the next 2 mornings. Return for worsening shortness of breath or chest pain that does not resolve.

## 2022-01-12 NOTE — ED Triage Notes (Signed)
Pt here from home with c/o some stomach pain and tightness along with some chest tightness , sob when walking

## 2022-01-12 NOTE — ED Provider Notes (Addendum)
[facility] EMERGENCY DEPARTMENT Provider Note   CSN: [id number] Arrival date & time: 01/12/22  1650     History  No chief complaint on file.   Phillip [name] is a 61 y.o. male.  Patient presents with upper stomach fullness primarily mild shortness of breath with walking and intermittent chest tightness.  Patient says this feels like he has extra fluid with nonspecific weight gain, mild leg swelling and mild orthopnea.  Patient follows Dr. Haroldine Laws outpatient.  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  Patient has history of A-fib and pneumonia as well.  No cough or fever.  No current chest pain now.  In addition patient has history of kidney disease.  Patient is on torsemide 40 mg 2 times daily.  Patient took an extra dose this morning to try and help.       Home Medications Prior to Admission medications   Medication Sig Start Date End Date Taking? Authorizing Provider  albuterol (PROAIR HFA) 108 (90 Base) MCG/ACT inhaler Inhale 2 puffs into the lungs every 4 (four) hours as needed for wheezing or shortness of breath. Patient taking differently: Inhale 2 puffs into the lungs every 4 (four) hours as needed for shortness of breath. 05/14/18   Tanda Rockers, MD  albuterol (PROVENTIL) (2.5 MG/3ML) 0.083% nebulizer solution INHALE 1 VIAL VIA NEBULIZER EVERY 6 HOURS AS NEEDED FOR WHEEZING/SHORTNESS OF BREATH. Patient taking differently: Take 2.5 mg by nebulization every 6 (six) hours as needed for shortness of breath. 02/22/21   Tanda Rockers, MD  allopurinol (ZYLOPRIM) 300 MG tablet Take 1 tablet (300 mg total) by mouth daily. 06/13/21 11/28/21  [name], MD  amiodarone (PACERONE) 200 MG tablet Take 1 tablet (200 mg total) by mouth daily. 10/23/21   Sherran Needs, NP  carvedilol (COREG) 6.25 MG tablet Take 2 tablets (12.5 mg total) by mouth 2 (two) times daily with a meal. 08/01/21  01/28/22  Bensimhon, Shaune Pascal, MD  Cholecalciferol (VITAMIN D3) 50 MCG (2000 UT) TABS Take 2,000 Units by mouth daily.    [provider]  colchicine 0.6 MG tablet Take 0.6 mg by mouth daily as needed (gout flares).  Patient not taking: Reported on 11/28/2021 07/25/16   [provider]  hydrALAZINE (APRESOLINE) 100 MG tablet Take 1 tablet (100 mg total) by mouth every 8 (eight) hours. 04/18/21   Shelly Coss, MD  isosorbide mononitrate (IMDUR) 120 MG 24 hr tablet Take 1 tablet (120 mg total) by mouth daily. 11/25/20   Kroeger, Daleen Snook M., PA-C  KLOR-CON M20 20 MEQ tablet TAKE 2 TABLETS (40 MEQ) EVERY MORNING AND 1 TABLET (20 MEQ) EVERY EVENING 01/25/21   Bensimhon, Shaune Pascal, MD  oxymetazoline (AFRIN NASAL SPRAY) 0.05 % nasal spray Place 1 spray into both nostrils 2 (two) times daily as needed for congestion. 03/12/17   Oswald Hillock, MD  Potassium Chloride ER 20 MEQ TBCR 2 tabs AM 1 tab PM 07/22/21   [provider]  Probiotic Product (PROBIOTIC DAILY PO) Take 1 capsule by mouth daily.    [provider]  sucralfate (CARAFATE) 1 g tablet Take 1 g by mouth 4 (four) times daily. 10/16/21   [provider]  SYMBICORT 80-4.5 MCG/ACT inhaler USE 2 INHALATIONS TWICE A DAY 12/09/21   Tanda Rockers, MD  torsemide (DEMADEX) 20 MG tablet Take 2 tablets (40 mg total) by mouth 2 (two) times daily.  03/22/21   Bensimhon, Shaune Pascal, MD  XARELTO 20 MG TABS tablet TAKE 1 TABLET DAILY WITH SUPPER (NEEDS APPOINTMENT FOR FURTHER REFILLS) 09/16/21   Bensimhon, Shaune Pascal, MD      Allergies    Other    Review of Systems   Review of Systems  Constitutional:  Negative for chills and fever.  HENT:  Negative for congestion.   Eyes:  Negative for visual disturbance.  Respiratory:  Positive for shortness of breath.   Cardiovascular:  Positive for leg swelling. Negative for chest pain.  Gastrointestinal:  Negative for abdominal pain and vomiting.  Genitourinary:  Negative for dysuria  and flank pain.  Musculoskeletal:  Negative for back pain, neck pain and neck stiffness.  Skin:  Negative for rash.  Neurological:  Negative for light-headedness and headaches.    Physical Exam Updated Vital Signs BP (!) 150/53   Pulse (!) 55   Temp 98.2 F (36.8 C) (Oral)   Resp 13   SpO2 98%  Physical Exam Vitals and nursing note reviewed.  Constitutional:      General: He is not in acute distress.    Appearance: He is well-developed.  HENT:     Head: Normocephalic and atraumatic.     Mouth/Throat:     Mouth: Mucous membranes are moist.  Eyes:     General:        Right eye: No discharge.        Left eye: No discharge.     Conjunctiva/sclera: Conjunctivae normal.  Neck:     Trachea: No tracheal deviation.  Cardiovascular:     Rate and Rhythm: Normal rate and regular rhythm.     Heart sounds: Murmur heard.  Pulmonary:     Effort: Pulmonary effort is normal.     Breath sounds: Normal breath sounds.  Abdominal:     General: There is no distension.     Palpations: Abdomen is soft.     Tenderness: There is no abdominal tenderness. There is no guarding.  Musculoskeletal:        General: Swelling (mild LE bilateral) present.     Cervical back: Normal range of motion and neck supple. No rigidity.  Skin:    General: Skin is warm.     Capillary Refill: Capillary refill takes less than 2 seconds.     Findings: No rash.  Neurological:     General: No focal deficit present.     Mental Status: He is alert.     Cranial Nerves: No cranial nerve deficit.  Psychiatric:        Mood and Affect: Mood normal.     ED Results / Procedures / Treatments   Labs (all labs ordered are listed, but only abnormal results are displayed) Labs Reviewed  BASIC METABOLIC PANEL - Abnormal; Notable for the following components:      Result Value   BUN 31 (*)    Creatinine, Ser 2.78 (*)    GFR, Estimated 25 (*)    All other components within normal limits  BRAIN NATRIURETIC PEPTIDE -  Abnormal; Notable for the following components:   B Natriuretic Peptide 1,751.9 (*)    All other components within normal limits  CBC - Abnormal; Notable for the following components:   WBC 2.9 (*)    RBC 3.71 (*)    Hemoglobin 10.7 (*)    HCT 35.6 (*)    RDW 15.8 (*)    Platelets 137 (*)    All other components within normal  limits  TROPONIN I (HIGH SENSITIVITY)    EKG None  Radiology DG Chest 2 View  Result Date: 01/12/2022 CLINICAL DATA:  Shortness of breath.  CHF. EXAM: CHEST - 2 VIEW COMPARISON:  Chest CT dated 04/17/2021. FINDINGS: There is cardiomegaly with mild central vascular congestion. No focal consolidation or pneumothorax. Trace left pleural effusion. No acute osseous pathology. IMPRESSION: Cardiomegaly with mild central vascular congestion, relatively similar to prior radiograph. Electronically Signed   By: Anner Crete M.D.   On: 01/12/2022 18:30    Procedures Procedures    Medications Ordered in ED Medications  furosemide (LASIX) injection 80 mg (80 mg Intravenous Given 01/12/22 2038)    ED Course/ Medical Decision Making/ A&P                           Medical Decision Making Amount and/or Complexity of Data Reviewed Labs: ordered. Radiology: ordered. ECG/medicine tests: ordered.  Risk Prescription drug management.  Patient with known congestive heart failure history, medical records reviewed and last echo done January 2023 showed 40 to 17% systolic ejection fraction with global hypokinesis.  Patient also has history of pulm embolism compliant with anticoagulant.  Patient has broad differential given medical history and presentation with shortness of breath, weight gain and bilateral leg swelling most likely concerning for mild heart failure exacerbation, other differentials include pulm embolism however patient compliant with medication says this feels different and has significant renal disease still unable to have IV contrast.  Other differentials  include anemia however patient is close to baseline 10.7 reviewed.  No signs of pneumonia on chest x-ray no fever no productive cough.  Initial BNP reviewed 1751.  Patient is well-appearing, smiling in the room, normal work of breathing.  Lasix 80 mg IV ordered, potassium normal in the ER.  Troponin added with intermittent chest tightness and plan for repeat EKG.  Patient prefers to follow-up closely outpatient if possible.  Patient follows with Dr. Haroldine Laws.  Patient creatinine elevated 2.7 similar to previous and anemia mild chronic 10.7.  Patient on reassessment feels improved, produce significant volume of urine.  Troponin mild elevated likely related to mild pulmonary edema however will repeat troponin make sure not increasing significantly.  Patient care be signed out to follow-up repeat troponin and likely discharge with outpatient follow-up.  EKG independently reviewed showing sinus rhythm, heart rate 55, mild artifact, mild prolonged QT, no acute ST elevation.       Final Clinical Impression(s) / ED Diagnoses Final diagnoses:  Acute pulmonary edema (HCC)  Systolic congestive heart failure, unspecified HF chronicity [facility])    Rx / DC Orders ED Discharge Orders     None         Elnora Morrison, MD 01/12/22 2317    Elnora Morrison, MD 01/12/22 2328

## 2022-01-13 LAB — TROPONIN I (HIGH SENSITIVITY): Troponin I (High Sensitivity): 44 ng/L — ABNORMAL HIGH (ref <18)

## 2022-01-13 NOTE — ED Provider Notes (Signed)
I assumed care of this patient.  Please see previous provider note for further details of Hx, PE.  Briefly patient is a 61 y.o. male who presented with chest pain and shortness of breath.  Patient has a history of CHF on torsemide.  Work-up is consistent with CHF exacerbation.  Patient prefers not to be admitted.  Would like to try diuresing in the emergency department and reassessment.  With the chest pain, patient does not have any acute changes on his EKG and initial troponin was 45.  This appears to be patient's baseline.  Plan is to get a delta troponin.  If troponin is flat and patient ambulates without significant increased work of breathing, he would be stable to follow-up with heart failure clinic.  Patient ambulated w/o increased WOB. Mild desat that quickly resolved. He feels comfortable with plan to DC.  The patient appears reasonably screened and/or stabilized for discharge and I doubt any other medical condition or other [facility] requiring further screening, evaluation, or treatment in the ED at this time. I have discussed the findings, Dx and Tx plan with the patient/family who expressed understanding and agree(s) with the plan. Discharge instructions discussed at length. The patient/family was given strict return precautions who verbalized understanding of the instructions. No further questions at time of discharge.  Disposition: Discharge  Condition: Good  ED Discharge Orders          Ordered    Ambulatory referral to Cardiology       Comments: If you have not heard from the Cardiology office within the next 72 hours please call [phone].   01/12/22 2318            Follow Up: Jolaine Artist, MD [address] [place] [address] [phone]  Schedule an appointment as soon as possible for a visit           Lovis More, Grayce Sessions, MD 01/13/22 [phone]

## 2022-01-13 NOTE — ED Notes (Signed)
Patient verbalizes understanding of discharge instructions. Opportunity for questioning and answers were provided. Armband removed by staff, pt discharged from ED.  

## 2022-01-13 NOTE — ED Notes (Signed)
Pt ambulated in hall and spo2 remained 88-94%. Pt denies shortness of breath during ambulation.

## 2022-01-15 ENCOUNTER — Telehealth: Payer: Self-pay

## 2022-01-15 NOTE — Telephone Encounter (Signed)
Spoke to pt to see how he was doing after recent ER visit. He said he is feeling better today. He is setting up an appt with cardiology. He said if he cannot get in to the cardiologist in a reasonable amount of time, he will call to set up an appt here.

## 2022-01-20 ENCOUNTER — Other Ambulatory Visit (HOSPITAL_COMMUNITY): Payer: Self-pay | Admitting: Internal Medicine

## 2022-01-20 DIAGNOSIS — E876 Hypokalemia: Secondary | ICD-10-CM

## 2022-01-30 ENCOUNTER — Inpatient Hospital Stay: Payer: Managed Care, Other (non HMO)

## 2022-02-02 NOTE — Progress Notes (Incomplete)
Advanced Heart Failure Clinic Note   Primary Care: [facility] Primary Cardiologist: Dr. [name]  EP: Dr Allred Nephrology: Dr. Candiss Norse HF MD: Dr Haroldine Laws   HPI: Phillip [name] is a 61 y.o. male hx of PAF, remote PE, hx of DVT, OSA, morbid obesity, CKD Stage III,  HTN, and  combined chronic systolic/diastolic. S/P A Flutter ablation May 2019 .     EF in 2015 40-45% Echo 12/21/16 with EF 55-60% G2DD, mild aortic regurgitation.  Last nuc 2008 no ischemia, no hx of cath due to CKD.    Developed AFL and underwent AFL ablation in 5/19.  Seen 7/20 and was feeling bad. Found to be in AFL/AF in 80s. Echo at that time with EF back down to 30%. Underwent DC-CV on 10/14/18 and referred to AF Clinic.   Echo 11/20 EF 45-50%  Developed recurrent AF at the end of January 2021. Underwent attempt at [facility] on 04/22/19 but failed to convert. Seen back in AF Clinic on 2/5. Not able to use Tikosyn due to long QT. Not ablation candidate due to obesity. Considered amiodarone. Started on flecainide but had treadmill to monitor Flecainide Tx.   Patient had significant QRS widening within first minute of exercise.  Flecainide stopped  Echo 5/21 EF 45-50%  Recurrent AF in 9/22. Admitted. Had DC-CV. Amiodarone started.   Echo 9/22 EF 45-50% Marked LAE   Admitted 1/23 with a/c CHF and afib RVR. Echo EF 40-45%, global LV HK, RV ok. Diuresed and underwent DCCV.   Had AF ablation by Dr. Rodena Goldmann at [place] 07/23. Saw Afib clinic 08/23 and in rate controlled AF. Worse 7 day monitor - rhythm was sinus with 14% AF burden.  Seen in ED 01/12/22 with a/c CHF. States he had been drinking a lot of fluid. Given 80 mg IV lasix. He diuresed well and was felt to be stable for discharge home.   He is here today for follow-up. Reports his weight had been down to 294 lb, recently gained 7 lb after eating out for several birthdays. Has occasional dyspnea but no significant change recently. No orthopnea or PND. 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 Has compression stockings but they are too tight. Takes extra torsemide as needed, last took extra dose 2-3 weeks ago. Feels like he has been back in afib for about a week, notes more fatigue. Has been adherent with CPAP.   Cardiac Studies: - Last nuc 2008 no ischemia  - Echo 2015 EF 40-45%  - Echo 12/21/16 with EF 55-60% G2DD, mild aortic regurgitation.    - Echo 7/20 EF 30%  - Echo 11/20 EF 45-50%  - Echo 5/21 EF 45-50%  - Echo 9/22 EF 45-50% Marked LAE   - Echo 1/23 EF 40-45%, global LV HK, RV ok.   Review of systems complete and found to be negative unless listed in HPI.    Past Medical History:  Diagnosis Date   Asthma    CHF (congestive heart failure) ([place]) 11/15   EF 40-45%, improved with follow-up   Chronic kidney disease    COPD (chronic obstructive pulmonary disease) (HCC)    Deep vein thrombosis (DVT) of lower extremity ([place]) 08/17/2015   DVT (deep venous thrombosis) (HCC)    Gout    Hypertension    Hypertensive cardiovascular disease    Kidney dysfunction    Morbid obesity (HCC)    OSA on CPAP    Paroxysmal atrial fibrillation ([place])  PNA (pneumonia)    Pulmonary embolism ([place]) 2010   left leg DVT   Sleep apnea    uses cpap   Current Outpatient Medications  Medication Sig Dispense Refill   albuterol (PROAIR HFA) 108 (90 Base) MCG/ACT inhaler Inhale 2 puffs into the lungs every 4 (four) hours as needed for wheezing or shortness of breath. (Patient taking differently: Inhale 2 puffs into the lungs every 4 (four) hours as needed for shortness of breath.) 1 Inhaler 3   albuterol (PROVENTIL) (2.5 MG/3ML) 0.083% nebulizer solution INHALE 1 VIAL VIA NEBULIZER EVERY 6 HOURS AS NEEDED FOR WHEEZING/SHORTNESS OF BREATH. (Patient taking differently: Take 2.5 mg by nebulization every 6 (six) hours as needed for shortness of breath.) 375 mL 11   allopurinol (ZYLOPRIM) 300 MG tablet Take 1 tablet (300 mg total) by mouth daily. 90 tablet  0   amiodarone (PACERONE) 200 MG tablet Take 1 tablet (200 mg total) by mouth daily. 90 tablet 1   apixaban (ELIQUIS) 5 MG TABS tablet Take 1 tablet (5 mg total) by mouth 2 (two) times daily. 180 tablet 3   carvedilol (COREG) 6.25 MG tablet Take 2 tablets (12.5 mg total) by mouth 2 (two) times daily with a meal. 180 tablet 3   Cholecalciferol (VITAMIN D3) 50 MCG (2000 UT) TABS Take 2,000 Units by mouth daily.     hydrALAZINE (APRESOLINE) 100 MG tablet Take 1 tablet (100 mg total) by mouth every 8 (eight) hours. 90 tablet 0   KLOR-CON M20 20 MEQ tablet TAKE 2 TABLETS (40 MEQ) EVERY MORNING AND 1 TABLET (20 MEQ) EVERY EVENING 270 tablet 3   oxymetazoline (AFRIN NASAL SPRAY) 0.05 % nasal spray Place 1 spray into both nostrils 2 (two) times daily as needed for congestion.     SYMBICORT 80-4.5 MCG/ACT inhaler USE 2 INHALATIONS TWICE A DAY (Patient taking differently: Inhale 2 puffs into the lungs 2 (two) times daily.) 30.6 g 3   torsemide (DEMADEX) 20 MG tablet Take 2 tablets (40 mg total) by mouth 2 (two) times daily. 360 tablet 3   isosorbide mononitrate (IMDUR) 120 MG 24 hr tablet Take 1 tablet (120 mg total) by mouth daily. 90 tablet 3   No current facility-administered medications for this encounter.   Allergies  Allergen Reactions   Other Anaphylaxis    mushrooms    Social History   Socioeconomic History   Marital status: Divorced    Spouse name: Not on file   Number of children: 2   Years of education: 12   Highest education level: Not on file  Occupational History   Occupation: Drives dump truck    Comment:     Occupation: Airline pilot    Comment: Disabled  Tobacco Use   Smoking status: Former    Packs/day: 0.25    Years: 1.00    Total pack years: 0.25    Types: Cigarettes    Quit date: 07/23/1989    Years since quitting: 32.5   Smokeless tobacco: Never   Tobacco comments:    Former smoker 03/06/2021  Vaping Use   Vaping Use: Never used  Substance and Sexual Activity    Alcohol use: No    Alcohol/week: 0.0 standard drinks of alcohol   Drug use: No   Sexual activity: Not Currently  Other Topics Concern   Not on file  Social History Narrative   Pt lives in [place], alone.   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  Has 2 sons in [place]      No living will   Would want sons to make decisions for him   Would accept resuscitation   Social Determinants of Health   Financial Resource Strain: Low Risk  (01/19/2018)   Overall Financial Resource Strain (CARDIA)    Difficulty of Paying Living Expenses: Not hard at all  Food Insecurity: No Food Insecurity (01/19/2018)   Hunger Vital Sign    Worried About Running Out of Food in the Last Year: Never true    Ran Out of Food in the Last Year: Never true  Transportation Needs: No Transportation Needs (01/19/2018)   PRAPARE - Hydrologist (Medical): No    Lack of Transportation (Non-Medical): No  Physical Activity: Not on file  Stress: Not on file  Social Connections: Not on file  Intimate Partner Violence: Not on file    Family History  Problem Relation Age of Onset   Hypertension Mother    Diabetes Father    CAD Father    Clotting disorder Father    Heart disease Father        transplant   Stomach cancer Father    Allergies Brother    Colon cancer Neg Hx    Colon polyps Neg Hx    Esophageal cancer Neg Hx    Rectal cancer Neg Hx    BP (!) 142/60   Pulse 73   Wt (!) 140.6 kg (310 lb)   SpO2 98%   BMI 49.29 kg/m   Wt Readings from Last 3 Encounters:  02/03/22 (!) 140.6 kg (310 lb)  11/28/21 (!) 138.3 kg (304 lb 12.8 oz)  10/24/21 (!) 140.1 kg (308 lb 12.8 oz)   PHYSICAL EXAM: General:  Well appearing. No resp difficulty HEENT: normal Neck: supple. no JVD. Carotids 2+ bilat; no bruits.  Cor: PMI nondisplaced. Irregular rhythm. No rubs, gallops or murmurs. Lungs: clear Abdomen: obese, soft, nontender, nondistended.   Extremities: no cyanosis, clubbing, rash, trace edema Neuro: alert & orientedx3, cranial nerves grossly intact. moves all 4 extremities w/o difficulty. Affect pleasant    ECG Afib 69 bpm  ASSESSMENT & PLAN:  1. Chronic HFmEF - Echo 11/2016 LVEF 55-60%, Grade 2 DD. - EF 7/20 back down to 30%. Suspect due to AF but rate not overly elevated. HTN may also be playing a role. - Symptoms improved after DC-CV on 10/11/18  - Echo 11/20 EF 45-50% - Echo 5/21 EF 45-50% - Echo 9/22 EF 45-50% - Echo 1/23 EF 40-45%, global LV HK, RV ok. - Stable NYHA II. Takes Torsemide 40 mg BID, has a good understanding of when to take an extra dose PRN. Lower extremity edema after sitting in truck for hours, given Rx for compression stockings. - Continue coreg 12.5 mg BID - Continue hydralazine 100 mg tid. Refilled imdur 120 mg daily, has been out of med a couple of weeks. - Off Farxiga due to AKI and refuses to restart due to cost. - Has not had cath due to CKD and lack of clear anginal symptoms.  - No ACE/ARB with CKD (creatinine has run 2.5-3.3) - Labs today  2. PAF / Typical A-flutter, paroxysmal - S/P AFL ablation 07/2017. - s/p DC-CV for AFib on 10/14/18 and 05/10/19 and 9/22 - Amio started 9/22 - S/p AF ablation 07/23 by Dr. Lenard Galloway - ECG today with recurrent AF, rate controlled - He reports his Nephrologist suggested considering Eliquis instead of Xarelto. Will switch to Eliquis  5 mg BID. - He is compliant with CPAP. - Given young age would like to avoid long-term exposure to [facility].  Check amiodarone labs today - Refer back to afib clinic to discuss options for rhythm control.  3. Hypertension - Blood pressure elevated today but has been out of Imdur. Refilled today.  4. Morbid obesity - Body mass index is 49.29 kg/m.  - He has lost over 100 pounds. Congratulated him. Recently gained some weight back, but is getting diet on track. - Has not been interested in [place].  5. CKD IV - Creatinine  baseline ~2.5 -3.0  - Follows with Dr. Candiss Norse.  -  Labs today  6. OSA on CPAP - Compliant with CPAP.  - no change  7. Iron deficiency anemia - Going for iron infusions - Hgb stable 10.7 a few weeks ago - Denies recent bleeding issues  Follow-up: 4 months  Consuela Widener N, PA-C  4:48 PM

## 2022-02-03 ENCOUNTER — Inpatient Hospital Stay: Payer: Managed Care, Other (non HMO) | Attending: Oncology

## 2022-02-03 ENCOUNTER — Encounter (HOSPITAL_COMMUNITY): Payer: Self-pay

## 2022-02-03 ENCOUNTER — Ambulatory Visit (HOSPITAL_COMMUNITY)
Admission: RE | Admit: 2022-02-03 | Discharge: 2022-02-03 | Disposition: A | Discharge status: Home / Self Care | Payer: Managed Care, Other (non HMO) | Source: Ambulatory Visit | Attending: Family Medicine | Admitting: Family Medicine

## 2022-02-03 VITALS — BP 142/60 | HR 73 | Wt 310.0 lb

## 2022-02-03 DIAGNOSIS — M7989 Other specified soft tissue disorders: Secondary | ICD-10-CM | POA: Insufficient documentation

## 2022-02-03 DIAGNOSIS — R0600 Dyspnea, unspecified: Secondary | ICD-10-CM | POA: Insufficient documentation

## 2022-02-03 DIAGNOSIS — Z832 Family history of diseases of the blood and blood-forming organs and certain disorders involving the immune mechanism: Secondary | ICD-10-CM | POA: Insufficient documentation

## 2022-02-03 DIAGNOSIS — I5022 Chronic systolic (congestive) heart failure: Secondary | ICD-10-CM | POA: Insufficient documentation

## 2022-02-03 DIAGNOSIS — Z87891 Personal history of nicotine dependence: Secondary | ICD-10-CM | POA: Diagnosis not present

## 2022-02-03 DIAGNOSIS — Z86718 Personal history of other venous thrombosis and embolism: Secondary | ICD-10-CM | POA: Insufficient documentation

## 2022-02-03 DIAGNOSIS — R600 Localized edema: Secondary | ICD-10-CM | POA: Insufficient documentation

## 2022-02-03 DIAGNOSIS — I483 Typical atrial flutter: Secondary | ICD-10-CM | POA: Insufficient documentation

## 2022-02-03 DIAGNOSIS — N184 Chronic kidney disease, stage 4 (severe): Secondary | ICD-10-CM | POA: Insufficient documentation

## 2022-02-03 DIAGNOSIS — G4733 Obstructive sleep apnea (adult) (pediatric): Secondary | ICD-10-CM | POA: Insufficient documentation

## 2022-02-03 DIAGNOSIS — Z833 Family history of diabetes mellitus: Secondary | ICD-10-CM | POA: Diagnosis not present

## 2022-02-03 DIAGNOSIS — Z800 Family history of malignant neoplasm of digestive organs: Secondary | ICD-10-CM | POA: Insufficient documentation

## 2022-02-03 DIAGNOSIS — I130 Hypertensive heart and chronic kidney disease with heart failure and stage 1 through stage 4 chronic kidney disease, or unspecified chronic kidney disease: Secondary | ICD-10-CM | POA: Insufficient documentation

## 2022-02-03 DIAGNOSIS — Z6841 Body Mass Index (BMI) 40.0 and over, adult: Secondary | ICD-10-CM | POA: Insufficient documentation

## 2022-02-03 DIAGNOSIS — Z86711 Personal history of pulmonary embolism: Secondary | ICD-10-CM | POA: Insufficient documentation

## 2022-02-03 DIAGNOSIS — Z7901 Long term (current) use of anticoagulants: Secondary | ICD-10-CM | POA: Insufficient documentation

## 2022-02-03 DIAGNOSIS — Z8249 Family history of ischemic heart disease and other diseases of the circulatory system: Secondary | ICD-10-CM | POA: Insufficient documentation

## 2022-02-03 DIAGNOSIS — I480 Paroxysmal atrial fibrillation: Secondary | ICD-10-CM | POA: Insufficient documentation

## 2022-02-03 DIAGNOSIS — D631 Anemia in chronic kidney disease: Secondary | ICD-10-CM | POA: Diagnosis present

## 2022-02-03 DIAGNOSIS — I10 Essential (primary) hypertension: Secondary | ICD-10-CM

## 2022-02-03 DIAGNOSIS — E538 Deficiency of other specified B group vitamins: Secondary | ICD-10-CM | POA: Insufficient documentation

## 2022-02-03 DIAGNOSIS — I509 Heart failure, unspecified: Secondary | ICD-10-CM | POA: Diagnosis not present

## 2022-02-03 DIAGNOSIS — I4891 Unspecified atrial fibrillation: Secondary | ICD-10-CM

## 2022-02-03 DIAGNOSIS — Z79899 Other long term (current) drug therapy: Secondary | ICD-10-CM | POA: Insufficient documentation

## 2022-02-03 LAB — COMPREHENSIVE METABOLIC PANEL
ALT: 31 U/L (ref 0–44)
AST: 34 U/L (ref 15–41)
Albumin: 3.3 g/dL — ABNORMAL LOW (ref 3.5–5.0)
Alkaline Phosphatase: 66 U/L (ref 38–126)
Anion gap: 7 (ref 5–15)
BUN: 38 mg/dL — ABNORMAL HIGH (ref 6–20)
CO2: 31 mmol/L (ref 22–32)
Calcium: 8.8 mg/dL — ABNORMAL LOW (ref 8.9–10.3)
Chloride: 108 mmol/L (ref 98–111)
Creatinine, Ser: 3.14 mg/dL — ABNORMAL HIGH (ref 0.61–1.24)
GFR, Estimated: 22 mL/min — ABNORMAL LOW (ref >60)
Glucose, Bld: 83 mg/dL (ref 70–99)
Potassium: 4.1 mmol/L (ref 3.5–5.1)
Sodium: 146 mmol/L — ABNORMAL HIGH (ref 135–145)
Total Bilirubin: 0.4 mg/dL (ref 0.3–1.2)
Total Protein: 5.6 g/dL — ABNORMAL LOW (ref 6.5–8.1)

## 2022-02-03 LAB — TSH: TSH: 1.979 u[IU]/mL (ref 0.350–4.500)

## 2022-02-03 LAB — BRAIN NATRIURETIC PEPTIDE: B Natriuretic Peptide: 1343.7 pg/mL — ABNORMAL HIGH (ref 0.0–100.0)

## 2022-02-03 LAB — T4, FREE: Free T4: 1.45 ng/dL — ABNORMAL HIGH (ref 0.61–1.12)

## 2022-02-03 MED ORDER — APIXABAN 5 MG PO TABS: 5.0000 mg | ORAL_TABLET | Freq: Two times a day (BID) | ORAL | 3 refills | Status: DC

## 2022-02-03 MED ORDER — CYANOCOBALAMIN 1000 MCG/ML IJ SOLN
1000.0000 ug | Freq: Once | INTRAMUSCULAR | Status: AC
  Administered 2022-02-03: 1000 ug via INTRAMUSCULAR
  Filled 2022-02-03: qty 1

## 2022-02-03 MED ORDER — ISOSORBIDE MONONITRATE ER 120 MG PO TB24: 120.0000 mg | ORAL_TABLET | Freq: Every day | ORAL | 3 refills | Status: DC

## 2022-02-03 NOTE — Addendum Note (Signed)
Encounter addended by: Joette Catching, PA-C on: 02/03/2022 4:57 PM  Actions taken: Clinical Note Signed

## 2022-02-03 NOTE — Patient Instructions (Signed)
Vitamin B12 Injection What is this medication? Vitamin B12 (VAHY tuh min B12) prevents and treats low vitamin B12 levels in your body. It is used in people who do not get enough vitamin B12 from their diet or when their digestive tract does not absorb enough. Vitamin B12 plays an important role in maintaining the health of your nervous system and red blood cells. This medicine may be used for other purposes; ask your health care provider or pharmacist if you have questions. COMMON BRAND NAME(S): B-12 Compliance Kit, B-12 Injection Kit, Cyomin, Dodex, LA-12, Nutri-Twelve, Physicians EZ Use B-12, Primabalt What should I tell my care team before I take this medication? They need to know if you have any of these conditions: Kidney disease Leber's disease Megaloblastic anemia An unusual or allergic reaction to cyanocobalamin, cobalt, other medications, foods, dyes, or preservatives Pregnant or trying to get pregnant Breast-feeding How should I use this medication? This medication is injected into a muscle or deeply under the skin. It is usually given in a clinic or care team's office. However, your care team may teach you how to inject yourself. Follow all instructions. Talk to your care team about the use of this medication in children. Special care may be needed. Overdosage: If you think you have taken too much of this medicine contact a poison control center or emergency room at once. NOTE: This medicine is only for you. Do not share this medicine with others. What if I miss a dose? If you are given your dose at a clinic or care team's office, call to reschedule your appointment. If you give your own injections, and you miss a dose, take it as soon as you can. If it is almost time for your next dose, take only that dose. Do not take double or extra doses. What may interact with this medication? Alcohol Colchicine This list may not describe all possible interactions. Give your health care  provider a list of all the medicines, herbs, non-prescription drugs, or dietary supplements you use. Also tell them if you smoke, drink alcohol, or use illegal drugs. Some items may interact with your medicine. What should I watch for while using this medication? Visit your care team regularly. You may need blood work done while you are taking this medication. You may need to follow a special diet. Talk to your care team. Limit your alcohol intake and avoid smoking to get the best benefit. What side effects may I notice from receiving this medication? Side effects that you should report to your care team as soon as possible: Allergic reactions--skin rash, itching, hives, swelling of the face, lips, tongue, or throat Swelling of the ankles, hands, or feet Trouble breathing Side effects that usually do not require medical attention (report to your care team if they continue or are bothersome): Diarrhea This list may not describe all possible side effects. Call your doctor for medical advice about side effects. You may report side effects to FDA at 1-800-FDA-1088. Where should I keep my medication? Keep out of the reach of children. Store at room temperature between 15 and 30 degrees C (59 and 85 degrees F). Protect from light. Throw away any unused medication after the expiration date. NOTE: This sheet is a summary. It may not cover all possible information. If you have questions about this medicine, talk to your doctor, pharmacist, or health care provider.  2023 Elsevier/Gold Standard (2007-05-01 00:00:00)

## 2022-02-03 NOTE — Patient Instructions (Addendum)
Thank you for coming in today  Labs were done today, if any labs are abnormal the clinic will call you No news is good news  STOP Xarelto  START Eliquis 5 mg 1 tablet twice daily   You have been referred to Atrial Fibrillation clinic their office will call you to make a appointment   Your physician recommends that you schedule a follow-up appointment in:  4 months in clinic   You have been order compression hose please wear them daily    Do the following things EVERYDAY: Weigh yourself in the morning before breakfast. Write it down and keep it in a log. Take your medicines as prescribed Eat low salt foods--Limit salt (sodium) to 2000 mg per day.  Stay as active as you can everyday Limit all fluids for the day to less than 2 liters  At the [facility], you and your health needs are our priority. As part of our continuing mission to provide you with exceptional heart care, we have created designated Provider Care Teams. These Care Teams include your primary Cardiologist (physician) and Advanced Practice Providers (APPs- Physician Assistants and Nurse Practitioners) who all work together to provide you with the care you need, when you need it.   You may see any of the following providers on your designated Care Team at your next follow up: Dr Glori Bickers Dr Loralie Champagne Dr. Roxana Hires, NP Lyda Jester, [place] [facility] [place], [place] Forestine Na, NP Audry Riles, PharmD   Please be sure to bring in all your medications bottles to every appointment.   If you have any questions or concerns before your next appointment please send [place] a message through [place] or call our office at [phone].    TO LEAVE A MESSAGE FOR THE NURSE SELECT OPTION 2, PLEASE LEAVE A MESSAGE INCLUDING: YOUR NAME DATE OF BIRTH CALL BACK NUMBER REASON FOR CALL**this is important as we prioritize the call backs  YOU WILL RECEIVE A CALL BACK THE SAME  DAY AS LONG AS YOU CALL BEFORE 4:00 PM

## 2022-02-04 ENCOUNTER — Telehealth (HOSPITAL_COMMUNITY): Payer: Self-pay

## 2022-02-04 ENCOUNTER — Other Ambulatory Visit (HOSPITAL_COMMUNITY): Payer: Self-pay | Admitting: Internal Medicine

## 2022-02-04 NOTE — Telephone Encounter (Signed)
Patient aware and agreeable. 

## 2022-02-04 NOTE — Telephone Encounter (Signed)
Patient stated he will get back in touch to set up lab appointment.

## 2022-02-19 ENCOUNTER — Encounter (HOSPITAL_COMMUNITY): Payer: Self-pay | Admitting: Nurse Practitioner

## 2022-02-19 ENCOUNTER — Ambulatory Visit (HOSPITAL_COMMUNITY)
Admission: RE | Admit: 2022-02-19 | Discharge: 2022-02-19 | Disposition: A | Discharge status: Home / Self Care | Payer: Managed Care, Other (non HMO) | Source: Ambulatory Visit | Attending: Nurse Practitioner | Admitting: Nurse Practitioner

## 2022-02-19 VITALS — BP 130/60 | HR 71 | Ht 66.5 in | Wt 315.6 lb

## 2022-02-19 DIAGNOSIS — I480 Paroxysmal atrial fibrillation: Secondary | ICD-10-CM | POA: Insufficient documentation

## 2022-02-19 DIAGNOSIS — Z86718 Personal history of other venous thrombosis and embolism: Secondary | ICD-10-CM | POA: Diagnosis not present

## 2022-02-19 DIAGNOSIS — G4733 Obstructive sleep apnea (adult) (pediatric): Secondary | ICD-10-CM | POA: Insufficient documentation

## 2022-02-19 DIAGNOSIS — I4819 Other persistent atrial fibrillation: Secondary | ICD-10-CM | POA: Diagnosis not present

## 2022-02-19 DIAGNOSIS — I447 Left bundle-branch block, unspecified: Secondary | ICD-10-CM | POA: Diagnosis not present

## 2022-02-19 DIAGNOSIS — Z7901 Long term (current) use of anticoagulants: Secondary | ICD-10-CM | POA: Insufficient documentation

## 2022-02-19 DIAGNOSIS — I4892 Unspecified atrial flutter: Secondary | ICD-10-CM | POA: Insufficient documentation

## 2022-02-19 DIAGNOSIS — Z79899 Other long term (current) drug therapy: Secondary | ICD-10-CM | POA: Diagnosis not present

## 2022-02-19 DIAGNOSIS — I5023 Acute on chronic systolic (congestive) heart failure: Secondary | ICD-10-CM | POA: Insufficient documentation

## 2022-02-19 DIAGNOSIS — J449 Chronic obstructive pulmonary disease, unspecified: Secondary | ICD-10-CM | POA: Insufficient documentation

## 2022-02-19 MED ORDER — RIVAROXABAN 20 MG PO TABS: 20.0000 mg | ORAL_TABLET | Freq: Every day | ORAL | Status: DC
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
## 2022-02-19 NOTE — Patient Instructions (Addendum)
Cardioversion scheduled POI:[id number], December 13th   Come to afib clinic for labs at [place] at the [facility] and go to admitting at 1130am   - Do not eat or drink anything after midnight the night prior to your procedure.   - Take all your morning medication (except diabetic medications) with a sip of water prior to arrival. - You will not be able to drive home after your procedure.    - Do NOT miss any doses of your blood thinner - if you should miss a dose please notify our office immediately.   - If you feel as if you go back into normal rhythm prior to scheduled cardioversion, please notify our office immediately.  If your procedure is canceled in the cardioversion suite you will be charged a cancellation fee.   If you are on weekly OZEMPIC, TRULICITY, MOUNJARO, OR BYDUREON  Hold medication 7 days prior to scheduled procedure/anesthesia.  Restart medication on the normal dosing day after scheduled procedure/anesthesia  If you are on daily BYETTA, WEGOVY, VICTOZA, ADLYXIN, OR RYBELSUS:   Hold medication 24 hours prior to scheduled procedure/anesthesia.   Restart medication on the following day after scheduled procedure/anesthesia   For those patients who have a scheduled procedure/anesthesia on the same day of the week as their dose, hold the medication on the day of surgery.  They can take their scheduled dose the week before.  **Patients on the above medications scheduled for elective procedures that have not held the medication for the appropriate amount of time are at risk of cancellation or change in the anesthetic plan.
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
## 2022-03-03 ENCOUNTER — Inpatient Hospital Stay: Payer: Managed Care, Other (non HMO) | Attending: Oncology

## 2022-03-03 DIAGNOSIS — E538 Deficiency of other specified B group vitamins: Secondary | ICD-10-CM

## 2022-03-03 DIAGNOSIS — D631 Anemia in chronic kidney disease: Secondary | ICD-10-CM | POA: Diagnosis present

## 2022-03-03 DIAGNOSIS — R0600 Dyspnea, unspecified: Secondary | ICD-10-CM | POA: Insufficient documentation

## 2022-03-03 DIAGNOSIS — Z79899 Other long term (current) drug therapy: Secondary | ICD-10-CM | POA: Diagnosis not present

## 2022-03-03 MED ORDER — CYANOCOBALAMIN 1000 MCG/ML IJ SOLN
1000.0000 ug | Freq: Once | INTRAMUSCULAR | Status: AC
  Administered 2022-03-03: 1000 ug via INTRAMUSCULAR
  Filled 2022-03-03: qty 1

## 2022-03-04 ENCOUNTER — Other Ambulatory Visit (HOSPITAL_COMMUNITY): Payer: Self-pay | Admitting: *Deleted

## 2022-03-04 DIAGNOSIS — I4819 Other persistent atrial fibrillation: Secondary | ICD-10-CM

## 2022-03-05 ENCOUNTER — Ambulatory Visit (HOSPITAL_BASED_OUTPATIENT_CLINIC_OR_DEPARTMENT_OTHER)
Admission: RE | Admit: 2022-03-05 | Discharge: 2022-03-05 | Disposition: A | Discharge status: Home / Self Care | Payer: Managed Care, Other (non HMO) | Source: Ambulatory Visit | Attending: Nurse Practitioner | Admitting: Internal Medicine

## 2022-03-05 ENCOUNTER — Encounter (HOSPITAL_COMMUNITY)
Admission: RE | Disposition: A | Discharge status: Home / Self Care | Payer: Self-pay | Source: Home / Self Care | Attending: Internal Medicine

## 2022-03-05 ENCOUNTER — Encounter (HOSPITAL_COMMUNITY): Payer: Self-pay | Admitting: Internal Medicine

## 2022-03-05 ENCOUNTER — Other Ambulatory Visit: Payer: Self-pay

## 2022-03-05 ENCOUNTER — Other Ambulatory Visit (HOSPITAL_COMMUNITY): Payer: Managed Care, Other (non HMO) | Admitting: Nurse Practitioner

## 2022-03-05 ENCOUNTER — Ambulatory Visit (HOSPITAL_BASED_OUTPATIENT_CLINIC_OR_DEPARTMENT_OTHER): Payer: Managed Care, Other (non HMO) | Admitting: Anesthesiology

## 2022-03-05 ENCOUNTER — Ambulatory Visit (HOSPITAL_COMMUNITY)
Admission: RE | Admit: 2022-03-05 | Discharge: 2022-03-05 | Disposition: A | Discharge status: Home / Self Care | Payer: Managed Care, Other (non HMO) | Attending: Internal Medicine | Admitting: Internal Medicine

## 2022-03-05 ENCOUNTER — Ambulatory Visit (HOSPITAL_COMMUNITY): Payer: Managed Care, Other (non HMO) | Admitting: Anesthesiology

## 2022-03-05 DIAGNOSIS — G4733 Obstructive sleep apnea (adult) (pediatric): Secondary | ICD-10-CM | POA: Diagnosis not present

## 2022-03-05 DIAGNOSIS — I4819 Other persistent atrial fibrillation: Secondary | ICD-10-CM

## 2022-03-05 DIAGNOSIS — Z87891 Personal history of nicotine dependence: Secondary | ICD-10-CM | POA: Insufficient documentation

## 2022-03-05 DIAGNOSIS — I110 Hypertensive heart disease with heart failure: Secondary | ICD-10-CM

## 2022-03-05 DIAGNOSIS — Z6841 Body Mass Index (BMI) 40.0 and over, adult: Secondary | ICD-10-CM | POA: Insufficient documentation

## 2022-03-05 DIAGNOSIS — I480 Paroxysmal atrial fibrillation: Secondary | ICD-10-CM

## 2022-03-05 DIAGNOSIS — I351 Nonrheumatic aortic (valve) insufficiency: Secondary | ICD-10-CM

## 2022-03-05 DIAGNOSIS — I509 Heart failure, unspecified: Secondary | ICD-10-CM

## 2022-03-05 DIAGNOSIS — Z7901 Long term (current) use of anticoagulants: Secondary | ICD-10-CM | POA: Diagnosis not present

## 2022-03-05 DIAGNOSIS — Z86718 Personal history of other venous thrombosis and embolism: Secondary | ICD-10-CM | POA: Insufficient documentation

## 2022-03-05 DIAGNOSIS — I130 Hypertensive heart and chronic kidney disease with heart failure and stage 1 through stage 4 chronic kidney disease, or unspecified chronic kidney disease: Secondary | ICD-10-CM | POA: Insufficient documentation

## 2022-03-05 DIAGNOSIS — I4892 Unspecified atrial flutter: Secondary | ICD-10-CM | POA: Insufficient documentation

## 2022-03-05 DIAGNOSIS — J449 Chronic obstructive pulmonary disease, unspecified: Secondary | ICD-10-CM

## 2022-03-05 DIAGNOSIS — N189 Chronic kidney disease, unspecified: Secondary | ICD-10-CM | POA: Insufficient documentation

## 2022-03-05 DIAGNOSIS — Z79899 Other long term (current) drug therapy: Secondary | ICD-10-CM | POA: Insufficient documentation

## 2022-03-05 HISTORY — PX: TEE WITHOUT CARDIOVERSION: SHX5443

## 2022-03-05 LAB — POCT I-STAT, CHEM 8
BUN: 35 mg/dL — ABNORMAL HIGH (ref 6–20)
Calcium, Ion: 1.14 mmol/L — ABNORMAL LOW (ref 1.15–1.40)
Chloride: 103 mmol/L (ref 98–111)
Creatinine, Ser: 3 mg/dL — ABNORMAL HIGH (ref 0.61–1.24)
Glucose, Bld: 83 mg/dL (ref 70–99)
HCT: 35 % — ABNORMAL LOW (ref 39.0–52.0)
Hemoglobin: 11.9 g/dL — ABNORMAL LOW (ref 13.0–17.0)
Potassium: 3.6 mmol/L (ref 3.5–5.1)
Sodium: 145 mmol/L (ref 135–145)
TCO2: 29 mmol/L (ref 22–32)

## 2022-03-05 SURGERY — ECHOCARDIOGRAM, TRANSESOPHAGEAL
Anesthesia: Monitor Anesthesia Care

## 2022-03-05 MED ORDER — SODIUM CHLORIDE 0.9 % IV SOLN: INTRAVENOUS | Status: DC

## 2022-03-05 MED ORDER — GLYCOPYRROLATE 0.2 MG/ML IJ SOLN
INTRAMUSCULAR | Status: DC | PRN
  Administered 2022-03-05: .2 mg via INTRAVENOUS

## 2022-03-05 MED ORDER — PROPOFOL 10 MG/ML IV BOLUS
INTRAVENOUS | Status: DC | PRN
  Administered 2022-03-05: 20 mg via INTRAVENOUS

## 2022-03-05 MED ORDER — PROPOFOL 500 MG/50ML IV EMUL
INTRAVENOUS | Status: DC | PRN
  Administered 2022-03-05: 125 ug/kg/min via INTRAVENOUS

## 2022-03-05 MED ORDER — LIDOCAINE 2% (20 MG/ML) 5 ML SYRINGE
INTRAMUSCULAR | Status: DC | PRN
  Administered 2022-03-05: 50 mg via INTRAVENOUS

## 2022-03-05 NOTE — Interval H&P Note (Signed)
History and Physical Interval Note:  03/05/2022 10:52 AM  Phillip [name]  has presented today for surgery, with the diagnosis of AFIB.  The various methods of treatment have been discussed with the patient and family. After consideration of risks, benefits and other options for treatment, the patient has consented to  Procedure(s): TRANSESOPHAGEAL ECHOCARDIOGRAM (TEE) (N/A) CARDIOVERSION (N/A) as a surgical intervention.  The patient's history has been reviewed, patient examined, no change in status, stable for surgery.  I have reviewed the patient's chart and labs.  Questions were answered to the patient's satisfaction.     [name]

## 2022-03-05 NOTE — Anesthesia Postprocedure Evaluation (Signed)
Anesthesia Post Note  Patient: Phillip [name]  Procedure(s) Performed: TRANSESOPHAGEAL ECHOCARDIOGRAM (TEE) CARDIOVERSION     Patient location during evaluation: PACU Anesthesia Type: MAC Level of consciousness: awake and alert Pain management: pain level controlled Vital Signs Assessment: post-procedure vital signs reviewed and stable Respiratory status: spontaneous breathing, nonlabored ventilation and respiratory function stable Cardiovascular status: blood pressure returned to baseline Postop Assessment: no apparent nausea or vomiting Anesthetic complications: no   No notable events documented.  Last Vitals:  Vitals:   03/05/22 1054 03/05/22 1243  BP: 128/63 (!) 117/43  Pulse: (!) 54 (!) 49  Resp: (!) 22 16  Temp: 36.4 C 36.4 C  SpO2: 95% 95%    Last Pain:  Vitals:   03/05/22 1243  TempSrc: Temporal  PainSc: 0-No pain                 Marthenia Rolling

## 2022-03-05 NOTE — Anesthesia Procedure Notes (Signed)
Procedure Name: MAC Date/Time: 03/05/2022 12:14 PM  Performed by: Mariea Clonts, CRNAPre-anesthesia Checklist: Patient identified, Emergency Drugs available, Suction available, Patient being monitored and Timeout performed Patient Re-evaluated:Patient Re-evaluated prior to induction Oxygen Delivery Method: Simple face mask and Nasal cannula

## 2022-03-05 NOTE — Plan of Care (Signed)
DCCV canceled, patient in sinus

## 2022-03-05 NOTE — Anesthesia Preprocedure Evaluation (Addendum)
Anesthesia Evaluation  Patient identified by MRN, date of birth, ID band Patient awake    Reviewed: Allergy & Precautions, NPO status , Patient's Chart, lab work & pertinent test results  History of Anesthesia Complications Negative for: history of anesthetic complications  Airway Mallampati: III  TM Distance: >3 FB Neck ROM: Full    Dental  (+) Missing,    Pulmonary asthma , sleep apnea , COPD, former smoker   Pulmonary exam normal        Cardiovascular hypertension, +CHF  Normal cardiovascular exam  TTE 04/15/21: EF 40-45%, global hypokinesis, moderate LVE, shadowing in dome of LA cannot r/o calcified thrombus, moderate LAE, mild MR     Neuro/Psych negative neurological ROS  negative psych ROS   GI/Hepatic negative GI ROS, Neg liver ROS,,,  Endo/Other    Morbid obesity  Renal/GU Renal InsufficiencyRenal disease  negative genitourinary   Musculoskeletal negative musculoskeletal ROS (+)    Abdominal   Peds  Hematology negative hematology ROS (+)   Anesthesia Other Findings Day of surgery medications reviewed with patient.  Reproductive/Obstetrics negative OB ROS                             Anesthesia Physical Anesthesia Plan  ASA: 3  Anesthesia Plan: MAC   Post-op Pain Management: Minimal or no pain anticipated   Induction:   PONV Risk Score and Plan: Treatment may vary due to age or medical condition and Propofol infusion  Airway Management Planned: Natural Airway and Nasal Cannula  Additional Equipment: None  Intra-op Plan:   Post-operative Plan:   Informed Consent: I have reviewed the patients History and Physical, chart, labs and discussed the procedure including the risks, benefits and alternatives for the proposed anesthesia with the patient or authorized representative who has indicated his/her understanding and acceptance.       Plan Discussed with:  CRNA  Anesthesia Plan Comments:         Anesthesia Quick Evaluation

## 2022-03-05 NOTE — CV Procedure (Addendum)
INDICATIONS: TTE showing LA mass  PROCEDURE:   Informed consent was obtained prior to the procedure. The risks, benefits and alternatives for the procedure were discussed and the patient comprehended these risks.  Risks include, but are not limited to, cough, sore throat, vomiting, nausea, somnolence, esophageal and stomach trauma or perforation, bleeding, low blood pressure, aspiration, pneumonia, infection, trauma to the teeth and death.    After a procedural time-out, the oropharynx was anesthetized with 20% benzocaine spray.   During this procedure the patient was administered a total of propofol 260 mg to achieve and maintain moderate conscious sedation.  The patient's heart rate, blood pressure, and oxygen saturationweare monitored continuously during the procedure. The period of conscious sedation was 20 minutes, of which I was present face-to-face 100% of this time.  The transesophageal probe was inserted in the esophagus and stomach without difficulty and multiple views were obtained.  The patient was kept under observation until the patient left the procedure room.  The patient left the procedure room in stable condition.   Agitated microbubble saline contrast was not administered.  COMPLICATIONS:    There were no immediate complications.  FINDINGS:  Mildly reduced LV function No left atrial mass No LA appendage thrombus Mild AI, sclerotic valve  RECOMMENDATIONS:     TTE LA mass likely artifact  No changes  Time Spent Directly with the Patient:  20 minutes   [name] 03/05/2022, 12:34 PM

## 2022-03-05 NOTE — Transfer of Care (Signed)
Immediate Anesthesia Transfer of Care Note  Patient: Phillip [name]  Procedure(s) Performed: TRANSESOPHAGEAL ECHOCARDIOGRAM (TEE) CARDIOVERSION  Patient Location: Endoscopy Unit  Anesthesia Type:MAC  Level of Consciousness: awake, alert , and oriented  Airway & Oxygen Therapy: Patient Spontanous Breathing and Patient connected to nasal cannula oxygen  Post-op Assessment: Report given to RN, Post -op Vital signs reviewed and stable, and Patient moving all extremities X 4  Post vital signs: Reviewed and stable  Last Vitals:  Vitals Value Taken Time  BP    Temp    Pulse    Resp    SpO2      Last Pain:  Vitals:   03/05/22 1054  TempSrc: Temporal  PainSc: 0-No pain         Complications: No notable events documented.

## 2022-03-05 NOTE — Discharge Instructions (Signed)

## 2022-03-05 NOTE — Progress Notes (Signed)
  Echocardiogram Echocardiogram Transesophageal has been performed.  Phillip [name] 03/05/2022, 1:58 PM

## 2022-03-09 ENCOUNTER — Encounter (HOSPITAL_COMMUNITY): Payer: Self-pay | Admitting: Internal Medicine

## 2022-03-10 ENCOUNTER — Ambulatory Visit (HOSPITAL_COMMUNITY): Payer: Managed Care, Other (non HMO) | Admitting: Nurse Practitioner

## 2022-03-14 ENCOUNTER — Encounter (HOSPITAL_COMMUNITY): Payer: Self-pay | Admitting: Nurse Practitioner

## 2022-03-14 ENCOUNTER — Encounter: Payer: Self-pay | Admitting: Family Medicine

## 2022-03-14 ENCOUNTER — Ambulatory Visit (HOSPITAL_COMMUNITY)
Admission: RE | Admit: 2022-03-14 | Discharge: 2022-03-14 | Disposition: A | Discharge status: Home / Self Care | Payer: Managed Care, Other (non HMO) | Source: Ambulatory Visit | Attending: Nurse Practitioner | Admitting: Nurse Practitioner

## 2022-03-14 ENCOUNTER — Ambulatory Visit: Payer: Managed Care, Other (non HMO) | Admitting: Family Medicine

## 2022-03-14 VITALS — BP 132/64 | HR 59 | Temp 98.4°F | Ht 66.75 in | Wt 315.2 lb

## 2022-03-14 VITALS — BP 138/60 | HR 52 | Ht 66.75 in | Wt 305.0 lb

## 2022-03-14 DIAGNOSIS — D6869 Other thrombophilia: Secondary | ICD-10-CM

## 2022-03-14 DIAGNOSIS — I480 Paroxysmal atrial fibrillation: Secondary | ICD-10-CM

## 2022-03-14 DIAGNOSIS — I4892 Unspecified atrial flutter: Secondary | ICD-10-CM | POA: Diagnosis not present

## 2022-03-14 DIAGNOSIS — Z86718 Personal history of other venous thrombosis and embolism: Secondary | ICD-10-CM | POA: Diagnosis not present

## 2022-03-14 DIAGNOSIS — G4733 Obstructive sleep apnea (adult) (pediatric): Secondary | ICD-10-CM | POA: Insufficient documentation

## 2022-03-14 DIAGNOSIS — J449 Chronic obstructive pulmonary disease, unspecified: Secondary | ICD-10-CM | POA: Insufficient documentation

## 2022-03-14 DIAGNOSIS — I4819 Other persistent atrial fibrillation: Secondary | ICD-10-CM | POA: Diagnosis not present

## 2022-03-14 DIAGNOSIS — J069 Acute upper respiratory infection, unspecified: Secondary | ICD-10-CM | POA: Diagnosis not present

## 2022-03-14 DIAGNOSIS — Z7901 Long term (current) use of anticoagulants: Secondary | ICD-10-CM | POA: Insufficient documentation

## 2022-03-14 DIAGNOSIS — Z79899 Other long term (current) drug therapy: Secondary | ICD-10-CM | POA: Diagnosis not present

## 2022-03-14 DIAGNOSIS — Z86711 Personal history of pulmonary embolism: Secondary | ICD-10-CM | POA: Insufficient documentation

## 2022-03-14 DIAGNOSIS — J029 Acute pharyngitis, unspecified: Secondary | ICD-10-CM | POA: Insufficient documentation

## 2022-03-14 MED ORDER — APIXABAN 5 MG PO TABS: 5.0000 mg | ORAL_TABLET | Freq: Every day | ORAL | 3 refills | Status: DC

## 2022-03-14 NOTE — Patient Instructions (Signed)
I think you have a viral uri /chest cold Unsure if it will go to your head  Get rest when you can   Continue the symbicort Keep albuterol on hand   If your wheezing increases or you get a tight chest please call
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
## 2022-03-14 NOTE — Progress Notes (Signed)
Subjective:    Patient ID: Phillip [name], male    DOB: 1961-01-11, 61 y.o.   MRN: [id number]  HPI 61 yo pt of Dr Silvio Pate presents with cough and congestion  Hehas a h/o CHF and asthma/copd   Wt Readings from Last 3 Encounters:  03/14/22 (!) 315 lb 4 oz (143 kg)  03/05/22 (!) 304 lb (137.9 kg)  02/19/22 (!) 315 lb 9.6 oz (143.2 kg)   49.75 kg/m  Congestion in chest  Coughed up some phlegm that was brown in color 2 d ago  Some improvement now   No nasal symptoms   Every now and then a little rattle  No constant wheeze  No shortness of breath  Pulse ox 93 which is little lower than usual   Symbicort Has not needed albuterol   No ST No ear pain   No otc medicine   Patient Active Problem List   Diagnosis Date Noted   Viral URI with cough 03/14/2022   Anemia of chronic renal failure 07/29/2021   Thrombocytopenia ([place]) 07/29/2021   Leucopenia 07/29/2021   B12 deficiency 07/29/2021   Acute on chronic systolic CHF (congestive heart failure) ([place]) 04/14/2021   Asthmatic bronchitis 02/26/2021   Preventative health care 03/09/2020   Benign neoplasm of ascending colon    Hyperlipidemia LDL goal <70 05/15/2019   Atrial fibrillation with rapid ventricular response ([place]) 06/22/2017   Asthma with COPD    Chronic diastolic heart failure ([place])    Anticoagulated 08/17/2015   Hx pulmonary embolism 08/17/2015   Paroxysmal atrial fibrillation ([place]) 08/04/2015   SVT (supraventricular tachycardia) 06/22/2015   Morbid obesity ([place]) 03/07/2015   Chronic combined systolic and diastolic CHF (congestive heart failure) ([place]) 03/07/2015   Essential hypertension    Gout    Mild persistent asthma in adult without complication 48/18/5631   OSA on CPAP 05/06/2014   Chronic kidney disease, stage IV (severe) ([place]) 05/06/2014   Past Medical History:  Diagnosis Date   Asthma    CHF (congestive heart failure) ([place]) 11/15   EF 40-45%, improved with follow-up   Chronic kidney disease    COPD  (chronic obstructive pulmonary disease) (HCC)    Deep vein thrombosis (DVT) of lower extremity ([place]) 08/17/2015   DVT (deep venous thrombosis) (HCC)    Gout    Hypertension    Hypertensive cardiovascular disease    Kidney dysfunction    Morbid obesity ([place])    OSA on CPAP    Paroxysmal atrial fibrillation (HCC)    PNA (pneumonia)    Pulmonary embolism ([place]) 2010   left leg DVT   Sleep apnea    uses cpap   Past Surgical History:  Procedure Laterality Date   A-FLUTTER ABLATION N/A 07/22/2017   Procedure: A-FLUTTER ABLATION;  Surgeon: Thompson Grayer, MD;  Location: [facility];  Service: Cardiovascular;  Laterality: N/A;   CARDIOVERSION N/A 10/14/2018   Procedure: CARDIOVERSION;  Surgeon: Jolaine Artist, MD;  Location: [facility] ENDOSCOPY;  Service: Cardiovascular;  Laterality: N/A;   CARDIOVERSION N/A 04/22/2019   Procedure: CARDIOVERSION;  Surgeon: Thayer Headings, MD;  Location: [facility] ENDOSCOPY;  Service: Cardiovascular;  Laterality: N/A;   CARDIOVERSION N/A 05/10/2019   Procedure: CARDIOVERSION;  Surgeon: Jolaine Artist, MD;  Location: [facility] ENDOSCOPY;  Service: Cardiovascular;  Laterality: N/A;   CARDIOVERSION N/A 11/23/2020   Procedure: CARDIOVERSION;  Surgeon: Pixie Casino, MD;  Location: [facility] ENDOSCOPY;  Service: Cardiovascular;  Laterality: N/A;   CARDIOVERSION N/A 04/16/2021   Procedure:  CARDIOVERSION;  Surgeon: Berniece Salines, DO;  Location: [facility];  Service: Cardiovascular;  Laterality: N/A;   COLONOSCOPY WITH PROPOFOL N/A 12/20/2019   Procedure: COLONOSCOPY WITH PROPOFOL;  Surgeon: Yetta Flock, MD;  Location: [facility] ENDOSCOPY;  Service: Gastroenterology;  Laterality: N/A;   POLYPECTOMY  12/20/2019   Procedure: POLYPECTOMY;  Surgeon: Yetta Flock, MD;  Location: [facility] ENDOSCOPY;  Service: Gastroenterology;;   TEE WITHOUT CARDIOVERSION N/A 03/05/2022   Procedure: TRANSESOPHAGEAL ECHOCARDIOGRAM (TEE);  Surgeon: [name], MD;  Location: [facility] ENDOSCOPY;  Service:  Cardiovascular;  Laterality: N/A;   UMBILICAL HERNIA REPAIR  1992   Social History   Tobacco Use   Smoking status: Former    Packs/day: 0.25    Years: 1.00    Total pack years: 0.25    Types: Cigarettes    Quit date: 07/23/1989    Years since quitting: 32.6   Smokeless tobacco: Never   Tobacco comments:    Former smoker 03/06/2021  Vaping Use   Vaping Use: Never used  Substance Use Topics   Alcohol use: No    Alcohol/week: 0.0 standard drinks of alcohol   Drug use: No   Family History  Problem Relation Age of Onset   Hypertension Mother    Diabetes Father    CAD Father    Clotting disorder Father    Heart disease Father        transplant   Stomach cancer Father    Allergies Brother    Colon cancer Neg Hx    Colon polyps Neg Hx    Esophageal cancer Neg Hx    Rectal cancer Neg Hx    Allergies  Allergen Reactions   Other Anaphylaxis    mushrooms   Current Outpatient Medications on File Prior to Visit  Medication Sig Dispense Refill   albuterol (PROAIR HFA) 108 (90 Base) MCG/ACT inhaler Inhale 2 puffs into the lungs every 4 (four) hours as needed for wheezing or shortness of breath. 1 Inhaler 3   albuterol (PROVENTIL) (2.5 MG/3ML) 0.083% nebulizer solution INHALE 1 VIAL VIA NEBULIZER EVERY 6 HOURS AS NEEDED FOR WHEEZING/SHORTNESS OF BREATH. 375 mL 11   allopurinol (ZYLOPRIM) 300 MG tablet Take 300 mg by mouth daily.     amiodarone (PACERONE) 200 MG tablet Take 1 tablet (200 mg total) by mouth daily. 90 tablet 1   calcium carbonate (OS-CAL - DOSED IN MG OF ELEMENTAL CALCIUM) 1250 (500 Ca) MG tablet Take 1 tablet by mouth daily.     carvedilol (COREG) 6.25 MG tablet TAKE 2 TABLETS (12.5 MG TOTAL) BY MOUTH 2 (TWO) TIMES DAILY WITH A MEAL. 120 tablet 5   Cholecalciferol (VITAMIN D3) 50 MCG (2000 UT) TABS Take 2,000 Units by mouth daily.     hydrALAZINE (APRESOLINE) 100 MG tablet Take 1 tablet (100 mg total) by mouth every 8 (eight) hours. 90 tablet 0   isosorbide  mononitrate (IMDUR) 120 MG 24 hr tablet Take 1 tablet (120 mg total) by mouth daily. 90 tablet 3   KLOR-CON M20 20 MEQ tablet TAKE 2 TABLETS (40 MEQ) EVERY MORNING AND 1 TABLET (20 MEQ) EVERY EVENING 270 tablet 3   oxymetazoline (AFRIN NASAL SPRAY) 0.05 % nasal spray Place 1 spray into both nostrils 2 (two) times daily as needed for congestion.     SYMBICORT 80-4.5 MCG/ACT inhaler USE 2 INHALATIONS TWICE A DAY (Patient taking differently: Inhale 2 puffs into the lungs 2 (two) times daily.) 30.6 g 3   torsemide (DEMADEX) 20 MG tablet  Take 2 tablets (40 mg total) by mouth 2 (two) times daily. 360 tablet 3   apixaban (ELIQUIS) 5 MG TABS tablet Take 1 tablet (5 mg total) by mouth daily. 180 tablet 3   No current facility-administered medications on file prior to visit.     Review of Systems  Constitutional:  Negative for activity change, appetite change, fatigue, fever and unexpected weight change.  HENT:  Negative for congestion, rhinorrhea, sore throat and trouble swallowing.   Eyes:  Negative for pain, redness, itching and visual disturbance.  Respiratory:  Positive for cough. Negative for chest tightness, shortness of breath and wheezing.        Chest congestion   Cardiovascular:  Negative for chest pain and palpitations.  Gastrointestinal:  Negative for abdominal pain, blood in stool, constipation, diarrhea and nausea.  Endocrine: Negative for cold intolerance, heat intolerance, polydipsia and polyuria.  Genitourinary:  Negative for difficulty urinating, dysuria, frequency and urgency.  Musculoskeletal:  Negative for arthralgias, joint swelling and myalgias.  Skin:  Negative for pallor and rash.  Neurological:  Negative for dizziness, tremors, weakness, numbness and headaches.  Hematological:  Negative for adenopathy. Does not bruise/bleed easily.  Psychiatric/Behavioral:  Negative for decreased concentration and dysphoric mood. The patient is not nervous/anxious.        Objective:    Physical Exam Constitutional:      General: He is not in acute distress.    Appearance: Normal appearance. He is well-developed. He is obese.  HENT:     Head: Normocephalic and atraumatic.     Right Ear: Tympanic membrane and ear canal normal.     Left Ear: Tympanic membrane and ear canal normal.     Nose: Nose normal.     Comments: Boggy nares    Mouth/Throat:     Mouth: Mucous membranes are moist.     Pharynx: Oropharynx is clear. No oropharyngeal exudate or posterior oropharyngeal erythema.     Comments: Scant clear pnd Eyes:     General:        Right eye: No discharge.        Left eye: No discharge.     Conjunctiva/sclera: Conjunctivae normal.     Pupils: Pupils are equal, round, and reactive to light.  Neck:     Thyroid: No thyromegaly.     Vascular: No carotid bruit or JVD.  Cardiovascular:     Rate and Rhythm: Normal rate and regular rhythm.     Heart sounds: Normal heart sounds.     No gallop.  Pulmonary:     Effort: Pulmonary effort is normal. No respiratory distress.     Breath sounds: Normal breath sounds. No stridor. No wheezing, rhonchi or rales.     Comments: Diffusely distant bs  No wheeze even on forced exp No crackles  Abdominal:     General: There is no distension or abdominal bruit.     Palpations: Abdomen is soft.  Musculoskeletal:     Cervical back: Normal range of motion and neck supple.     Right lower leg: No edema.     Left lower leg: No edema.  Lymphadenopathy:     Cervical: No cervical adenopathy.  Skin:    General: Skin is warm and dry.     Coloration: Skin is not pale.     Findings: No rash.  Neurological:     Mental Status: He is alert.     Coordination: Coordination normal.     Deep Tendon Reflexes: Reflexes are  normal and symmetric. Reflexes normal.  Psychiatric:        Mood and Affect: Mood normal.           Assessment & Plan:   Problem List Items Addressed This Visit       Respiratory   Viral URI with cough - Primary     Mild and improved from yesterday Primarily low resp symptoms  Neg covid testing No flare of asthma - no wheeze today  Disc sympt care and need for rest/fluids Disc ER precautions Continue symbicort and use albuterol prn  Consider prednisone if he begins to wheeze

## 2022-03-14 NOTE — Assessment & Plan Note (Addendum)
Mild and improved from yesterday Primarily low resp symptoms  Neg covid testing No flare of asthma - no wheeze today  Disc sympt care and need for rest/fluids Disc ER precautions Continue symbicort and use albuterol prn  Consider prednisone if he begins to wheeze

## 2022-03-18 ENCOUNTER — Other Ambulatory Visit (HOSPITAL_COMMUNITY): Payer: Self-pay | Admitting: *Deleted

## 2022-03-18 MED ORDER — APIXABAN 5 MG PO TABS: 5.0000 mg | ORAL_TABLET | Freq: Two times a day (BID) | ORAL | 3 refills | Status: DC

## 2022-03-25 DIAGNOSIS — N2581 Secondary hyperparathyroidism of renal origin: Secondary | ICD-10-CM | POA: Insufficient documentation

## 2022-03-30 ENCOUNTER — Other Ambulatory Visit (HOSPITAL_COMMUNITY): Payer: Self-pay | Admitting: Nurse Practitioner

## 2022-03-31 ENCOUNTER — Encounter: Payer: Self-pay | Admitting: Cardiovascular Disease

## 2022-03-31 ENCOUNTER — Ambulatory Visit: Payer: Managed Care, Other (non HMO) | Attending: Cardiovascular Disease | Admitting: Cardiovascular Disease

## 2022-03-31 ENCOUNTER — Other Ambulatory Visit (HOSPITAL_COMMUNITY): Payer: Self-pay | Admitting: Internal Medicine

## 2022-03-31 ENCOUNTER — Inpatient Hospital Stay: Payer: Managed Care, Other (non HMO) | Attending: Oncology

## 2022-03-31 VITALS — BP 160/74 | HR 57 | Ht 66.75 in | Wt 303.4 lb

## 2022-03-31 DIAGNOSIS — I480 Paroxysmal atrial fibrillation: Secondary | ICD-10-CM | POA: Insufficient documentation

## 2022-03-31 DIAGNOSIS — D696 Thrombocytopenia, unspecified: Secondary | ICD-10-CM | POA: Insufficient documentation

## 2022-03-31 DIAGNOSIS — Z832 Family history of diseases of the blood and blood-forming organs and certain disorders involving the immune mechanism: Secondary | ICD-10-CM | POA: Insufficient documentation

## 2022-03-31 DIAGNOSIS — Z86718 Personal history of other venous thrombosis and embolism: Secondary | ICD-10-CM | POA: Insufficient documentation

## 2022-03-31 DIAGNOSIS — E538 Deficiency of other specified B group vitamins: Secondary | ICD-10-CM | POA: Insufficient documentation

## 2022-03-31 DIAGNOSIS — Z7901 Long term (current) use of anticoagulants: Secondary | ICD-10-CM | POA: Insufficient documentation

## 2022-03-31 DIAGNOSIS — N184 Chronic kidney disease, stage 4 (severe): Secondary | ICD-10-CM | POA: Insufficient documentation

## 2022-03-31 DIAGNOSIS — Z8249 Family history of ischemic heart disease and other diseases of the circulatory system: Secondary | ICD-10-CM | POA: Insufficient documentation

## 2022-03-31 DIAGNOSIS — Z87891 Personal history of nicotine dependence: Secondary | ICD-10-CM | POA: Insufficient documentation

## 2022-03-31 DIAGNOSIS — R634 Abnormal weight loss: Secondary | ICD-10-CM | POA: Insufficient documentation

## 2022-03-31 DIAGNOSIS — I509 Heart failure, unspecified: Secondary | ICD-10-CM | POA: Insufficient documentation

## 2022-03-31 DIAGNOSIS — Z79899 Other long term (current) drug therapy: Secondary | ICD-10-CM | POA: Insufficient documentation

## 2022-03-31 DIAGNOSIS — D72819 Decreased white blood cell count, unspecified: Secondary | ICD-10-CM | POA: Insufficient documentation

## 2022-03-31 DIAGNOSIS — Z800 Family history of malignant neoplasm of digestive organs: Secondary | ICD-10-CM | POA: Insufficient documentation

## 2022-03-31 DIAGNOSIS — D631 Anemia in chronic kidney disease: Secondary | ICD-10-CM | POA: Insufficient documentation

## 2022-03-31 DIAGNOSIS — Z833 Family history of diabetes mellitus: Secondary | ICD-10-CM | POA: Insufficient documentation

## 2022-03-31 DIAGNOSIS — Z86711 Personal history of pulmonary embolism: Secondary | ICD-10-CM | POA: Insufficient documentation

## 2022-03-31 DIAGNOSIS — R0600 Dyspnea, unspecified: Secondary | ICD-10-CM | POA: Insufficient documentation

## 2022-03-31 NOTE — Progress Notes (Signed)
Electrophysiology Office Note:    Date:  03/31/2022   ID:  Phillip [name], DOB Jun 06, 1960, MRN [id number]  PCP:  [name], MD   [facility] Cardiologist:  Glori Bickers, MD Electrophysiologist:  Melida Quitter, MD  Advanced Heart Failure:  Glori Bickers, MD     Referring MD: Sherran Needs, NP   History of Present Illness:    Phillip [name] is a 62 y.o. male with a hx listed below, significant for paroxysmal atrial fibrillation and flutter, CHFrEF, COPD, morbid obesity referred for arrhythmia management.  He underwent ablation of atrial flutter by Dr. Rayann Heman in May 2019. He has had persistent atrial fibrillation. In Feb 2021, cardioversion was attempted but failed. He was maintained on amiodarone for a while but had recurrence and underwent an ablation by Dr. Rodena Goldmann at [facility] in July, 2023. In follow-up in our AF clinic the following month, he was back in AF. The ablation was complicated by a large hematoma. He continued amiodarone and was referred for cardioversion. He was noted to be in sinus in his most recent follow-up in December.  He is religious about wearing CPAP.  He reports that he has lost about 120 pounds in the past 9 months.  We discussed diet at length.  He has a small plate that he feels with vegetables that he supplements with a small strip of chicken or maybe a chicken wing or 2.   Past Medical History:  Diagnosis Date   Asthma    CHF (congestive heart failure) ([place]) 11/15   EF 40-45%, improved with follow-up   Chronic kidney disease    COPD (chronic obstructive pulmonary disease) (HCC)    Deep vein thrombosis (DVT) of lower extremity ([place]) 08/17/2015   DVT (deep venous thrombosis) (HCC)    Gout    Hypertension    Hypertensive cardiovascular disease    Kidney dysfunction    Morbid obesity (HCC)    OSA on CPAP    Paroxysmal atrial fibrillation (HCC)    PNA (pneumonia)    Pulmonary embolism ([place]) 2010   left leg DVT    Sleep apnea    uses cpap    Past Surgical History:  Procedure Laterality Date   A-FLUTTER ABLATION N/A 07/22/2017   Procedure: A-FLUTTER ABLATION;  Surgeon: Thompson Grayer, MD;  Location: [facility];  Service: Cardiovascular;  Laterality: N/A;   CARDIOVERSION N/A 10/14/2018   Procedure: CARDIOVERSION;  Surgeon: Jolaine Artist, MD;  Location: [facility];  Service: Cardiovascular;  Laterality: N/A;   CARDIOVERSION N/A 04/22/2019   Procedure: CARDIOVERSION;  Surgeon: Thayer Headings, MD;  Location: [facility];  Service: Cardiovascular;  Laterality: N/A;   CARDIOVERSION N/A 05/10/2019   Procedure: CARDIOVERSION;  Surgeon: Jolaine Artist, MD;  Location: [facility];  Service: Cardiovascular;  Laterality: N/A;   CARDIOVERSION N/A 11/23/2020   Procedure: CARDIOVERSION;  Surgeon: Pixie Casino, MD;  Location: [facility];  Service: Cardiovascular;  Laterality: N/A;   CARDIOVERSION N/A 04/16/2021   Procedure: CARDIOVERSION;  Surgeon: Berniece Salines, DO;  Location: [facility];  Service: Cardiovascular;  Laterality: N/A;   COLONOSCOPY WITH PROPOFOL N/A 12/20/2019   Procedure: COLONOSCOPY WITH PROPOFOL;  Surgeon: Yetta Flock, MD;  Location: [facility] ENDOSCOPY;  Service: Gastroenterology;  Laterality: N/A;   POLYPECTOMY  12/20/2019   Procedure: POLYPECTOMY;  Surgeon: Yetta Flock, MD;  Location: [facility] ENDOSCOPY;  Service: Gastroenterology;;   TEE WITHOUT CARDIOVERSION N/A 03/05/2022   Procedure: TRANSESOPHAGEAL ECHOCARDIOGRAM (TEE);  Surgeon:  [name], MD;  Location: [facility] ENDOSCOPY;  Service: Cardiovascular;  Laterality: N/A;   UMBILICAL HERNIA REPAIR  1992    Current Medications: Current Meds  Medication Sig   albuterol (PROAIR HFA) 108 (90 Base) MCG/ACT inhaler Inhale 2 puffs into the lungs every 4 (four) hours as needed for wheezing or shortness of breath.   albuterol (PROVENTIL) (2.5 MG/3ML) 0.083% nebulizer solution INHALE 1 VIAL VIA NEBULIZER EVERY 6 HOURS AS  NEEDED FOR WHEEZING/SHORTNESS OF BREATH.   allopurinol (ZYLOPRIM) 300 MG tablet Take 300 mg by mouth daily.   amiodarone (PACERONE) 200 MG tablet TAKE 1 TABLET BY MOUTH EVERY DAY   apixaban (ELIQUIS) 5 MG TABS tablet Take 1 tablet (5 mg total) by mouth 2 (two) times daily.   calcium carbonate (OS-CAL - DOSED IN MG OF ELEMENTAL CALCIUM) 1250 (500 Ca) MG tablet Take 1 tablet by mouth daily.   carvedilol (COREG) 6.25 MG tablet TAKE 2 TABLETS (12.5 MG TOTAL) BY MOUTH 2 (TWO) TIMES DAILY WITH A MEAL.   Cholecalciferol (VITAMIN D3) 50 MCG (2000 UT) TABS Take 2,000 Units by mouth daily.   hydrALAZINE (APRESOLINE) 100 MG tablet Take 1 tablet (100 mg total) by mouth every 8 (eight) hours.   isosorbide mononitrate (IMDUR) 120 MG 24 hr tablet Take 1 tablet (120 mg total) by mouth daily.   KLOR-CON M20 20 MEQ tablet TAKE 2 TABLETS (40 MEQ) EVERY MORNING AND 1 TABLET (20 MEQ) EVERY EVENING   oxymetazoline (AFRIN NASAL SPRAY) 0.05 % nasal spray Place 1 spray into both nostrils 2 (two) times daily as needed for congestion.   SYMBICORT 80-4.5 MCG/ACT inhaler USE 2 INHALATIONS TWICE A DAY (Patient taking differently: Inhale 2 puffs into the lungs 2 (two) times daily.)   torsemide (DEMADEX) 20 MG tablet TAKE 2 TABLETS BY MOUTH 2 TIMES DAILY.     Allergies:   Other   Social History   Socioeconomic History   Marital status: Divorced    Spouse name: Not on file   Number of children: 2   Years of education: 12   Highest education level: Not on file  Occupational History   Occupation: Drives dump truck    Comment:     Occupation: Airline pilot    Comment: Disabled  Tobacco Use   Smoking status: Former    Packs/day: 0.25    Years: 1.00    Total pack years: 0.25    Types: Cigarettes    Quit date: 07/23/1989    Years since quitting: 32.7   Smokeless tobacco: Never   Tobacco comments:    Former smoker 03/06/2021  Vaping Use   Vaping Use: Never used  Substance and Sexual Activity   Alcohol use: No     Alcohol/week: 0.0 standard drinks of alcohol   Drug use: No   Sexual activity: Not Currently  Other Topics Concern   Not on file  Social History Narrative   Pt lives in [place], alone.   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   Has 2 sons in [place]      No living will   Would want sons to make decisions for him   Would accept resuscitation   Social Determinants of Health   Financial Resource Strain: Low Risk  (01/19/2018)   Overall Financial Resource Strain (CARDIA)    Difficulty of Paying Living Expenses: Not hard at all  Food Insecurity: No Food Insecurity (01/19/2018)   Hunger Vital Sign    Worried  About Running Out of Food in the Last Year: Never true    Ran Out of Food in the Last Year: Never true  Transportation Needs: No Transportation Needs (01/19/2018)   PRAPARE - Hydrologist (Medical): No    Lack of Transportation (Non-Medical): No  Physical Activity: Not on file  Stress: Not on file  Social Connections: Not on file     Family History: The patient's family history includes Allergies in his brother; CAD in his father; Clotting disorder in his father; Diabetes in his father; Heart disease in his father; Hypertension in his mother; Stomach cancer in his father. There is no history of Colon cancer, Colon polyps, Esophageal cancer, or Rectal cancer.  ROS:   Please see the history of present illness.    All other systems reviewed and are negative.  EKGs/Labs/Other Studies Reviewed Today:     EKG:  Last EKG results: today - sinus rhythm   Recent Labs: 04/16/2021: Magnesium 2.2 01/12/2022: Platelets 137 02/03/2022: ALT 31; B Natriuretic Peptide 1,343.7; TSH 1.979 03/05/2022: BUN 35; Creatinine, Ser 3.00; Hemoglobin 11.9; Potassium 3.6; Sodium 145     Physical Exam:    VS:  BP (!) 160/74   Pulse (!) 57   Ht 5' 6.75" (1.695 m)   Wt (!) 303 lb 6.4 oz (137.6 kg)   SpO2 94%   BMI 47.88 kg/m      Wt Readings from Last 3 Encounters:  03/31/22 (!) 303 lb 6.4 oz (137.6 kg)  03/14/22 (!) 305 lb (138.3 kg)  03/14/22 (!) 315 lb 4 oz (143 kg)     GEN: Morbidly obese -- Well nourished, well developed in no acute distress CARDIAC: RRR, no murmurs, rubs, gallops RESPIRATORY:  Normal work of breathing MUSCULOSKELETAL: no edema    ASSESSMENT & PLAN:    Persistent atrial fibrillation: Continue sinus on amiodarone.  I do not think he is a candidate for ablation right now due to morbid obesity.  His AF ablation earlier this year resulted in a significant hematoma, and he had early recurrence of atrial fibrillation.  I encouraged him to continue weight loss.  If he is able to lose an additional 40 or 50 pounds, he would be a candidate for repeat ablation.  Moreover, his AF may resolve or improve substantially if his weight approaches a reasonable measure. Continue monitoring while on amiodarone. I would think it reasonable to DC amiodarone if his weight gets to about 250 lbs. If he has recurrence, I would reconsider ablation at this weight. Paroxysmal atrial flutter: s/p ablation by Dr. Rayann Heman in 2019 Acute on chronic CHF: continue follow-up in CHF clinic        Medication Adjustments/Labs and Tests Ordered: Current medicines are reviewed at length with the patient today.  Concerns regarding medicines are outlined above.  Orders Placed This Encounter  Procedures   EKG 12-Lead   No orders of the defined types were placed in this encounter.    Signed, Melida Quitter, MD  03/31/2022 3:10 PM    [place]

## 2022-03-31 NOTE — Patient Instructions (Signed)
Medication Instructions:  Your physician recommends that you continue on your current medications as directed. Please refer to the Current Medication list given to you today.  *If you need a refill on your cardiac medications before your next appointment, please call your pharmacy*  Lab Work: NONE  Testing/Procedures: NONE  Follow-Up: At [facility], you and your health needs are our priority.  As part of our continuing mission to provide you with exceptional heart care, we have created designated Provider Care Teams.  These Care Teams include your primary Cardiologist (physician) and Advanced Practice Providers (APPs -  Physician Assistants and Nurse Practitioners) who all work together to provide you with the care you need, when you need it.  Your next appointment:   6 month(s) in a-fib clinic  The format for your next appointment:   In Person  Provider:   Roderic Palau, NP  Important Information About Sugar

## 2022-04-02 MED FILL — Iron Sucrose Inj 20 MG/ML (Fe Equiv): INTRAVENOUS | Qty: 10 | Status: AC

## 2022-04-03 ENCOUNTER — Encounter: Payer: Self-pay | Admitting: Oncology

## 2022-04-03 ENCOUNTER — Inpatient Hospital Stay: Payer: Managed Care, Other (non HMO)

## 2022-04-03 ENCOUNTER — Inpatient Hospital Stay (HOSPITAL_BASED_OUTPATIENT_CLINIC_OR_DEPARTMENT_OTHER): Payer: Managed Care, Other (non HMO) | Admitting: Oncology

## 2022-04-03 VITALS — BP 138/59 | HR 50 | Temp 98.6°F | Resp 18 | Wt 301.0 lb

## 2022-04-03 VITALS — BP 133/51 | HR 43

## 2022-04-03 DIAGNOSIS — Z8249 Family history of ischemic heart disease and other diseases of the circulatory system: Secondary | ICD-10-CM | POA: Diagnosis not present

## 2022-04-03 DIAGNOSIS — D696 Thrombocytopenia, unspecified: Secondary | ICD-10-CM

## 2022-04-03 DIAGNOSIS — Z79899 Other long term (current) drug therapy: Secondary | ICD-10-CM | POA: Diagnosis not present

## 2022-04-03 DIAGNOSIS — I480 Paroxysmal atrial fibrillation: Secondary | ICD-10-CM

## 2022-04-03 DIAGNOSIS — E538 Deficiency of other specified B group vitamins: Secondary | ICD-10-CM | POA: Diagnosis not present

## 2022-04-03 DIAGNOSIS — I509 Heart failure, unspecified: Secondary | ICD-10-CM | POA: Diagnosis not present

## 2022-04-03 DIAGNOSIS — D631 Anemia in chronic kidney disease: Secondary | ICD-10-CM | POA: Diagnosis present

## 2022-04-03 DIAGNOSIS — Z86718 Personal history of other venous thrombosis and embolism: Secondary | ICD-10-CM | POA: Diagnosis not present

## 2022-04-03 DIAGNOSIS — D72819 Decreased white blood cell count, unspecified: Secondary | ICD-10-CM | POA: Diagnosis not present

## 2022-04-03 DIAGNOSIS — N184 Chronic kidney disease, stage 4 (severe): Secondary | ICD-10-CM

## 2022-04-03 DIAGNOSIS — Z86711 Personal history of pulmonary embolism: Secondary | ICD-10-CM | POA: Diagnosis not present

## 2022-04-03 DIAGNOSIS — Z87891 Personal history of nicotine dependence: Secondary | ICD-10-CM | POA: Diagnosis not present

## 2022-04-03 DIAGNOSIS — Z832 Family history of diseases of the blood and blood-forming organs and certain disorders involving the immune mechanism: Secondary | ICD-10-CM | POA: Diagnosis not present

## 2022-04-03 DIAGNOSIS — Z7901 Long term (current) use of anticoagulants: Secondary | ICD-10-CM | POA: Diagnosis not present

## 2022-04-03 DIAGNOSIS — Z833 Family history of diabetes mellitus: Secondary | ICD-10-CM | POA: Diagnosis not present

## 2022-04-03 DIAGNOSIS — Z800 Family history of malignant neoplasm of digestive organs: Secondary | ICD-10-CM | POA: Diagnosis not present

## 2022-04-03 DIAGNOSIS — R634 Abnormal weight loss: Secondary | ICD-10-CM | POA: Diagnosis not present

## 2022-04-03 DIAGNOSIS — R0600 Dyspnea, unspecified: Secondary | ICD-10-CM | POA: Diagnosis not present

## 2022-04-03 LAB — CBC WITH DIFFERENTIAL/PLATELET
Abs Immature Granulocytes: 0.01 10*3/uL (ref 0.00–0.07)
Basophils Absolute: 0 10*3/uL (ref 0.0–0.1)
Basophils Relative: 1 %
Eosinophils Absolute: 0 10*3/uL (ref 0.0–0.5)
Eosinophils Relative: 1 %
HCT: 33.1 % — ABNORMAL LOW (ref 39.0–52.0)
Hemoglobin: 10.5 g/dL — ABNORMAL LOW (ref 13.0–17.0)
Immature Granulocytes: 0 %
Lymphocytes Relative: 19 %
Lymphs Abs: 0.6 10*3/uL — ABNORMAL LOW (ref 0.7–4.0)
MCH: 29.2 pg (ref 26.0–34.0)
MCHC: 31.7 g/dL (ref 30.0–36.0)
MCV: 92.2 fL (ref 80.0–100.0)
Monocytes Absolute: 0.4 10*3/uL (ref 0.1–1.0)
Monocytes Relative: 11 %
Neutro Abs: 2.2 10*3/uL (ref 1.7–7.7)
Neutrophils Relative %: 68 %
Platelets: 136 10*3/uL — ABNORMAL LOW (ref 150–400)
RBC: 3.59 MIL/uL — ABNORMAL LOW (ref 4.22–5.81)
RDW: 15.6 % — ABNORMAL HIGH (ref 11.5–15.5)
WBC: 3.2 10*3/uL — ABNORMAL LOW (ref 4.0–10.5)
nRBC: 0 % (ref 0.0–0.2)

## 2022-04-03 LAB — VITAMIN B12: Vitamin B-12: 1007 pg/mL — ABNORMAL HIGH (ref 180–914)

## 2022-04-03 LAB — IRON AND TIBC
Iron: 49 ug/dL (ref 45–182)
Saturation Ratios: 17 % — ABNORMAL LOW (ref 17.9–39.5)
TIBC: 288 ug/dL (ref 250–450)
UIBC: 239 ug/dL

## 2022-04-03 LAB — FERRITIN: Ferritin: 55 ng/mL (ref 24–336)

## 2022-04-03 MED ORDER — SODIUM CHLORIDE 0.9 % IV SOLN
200.0000 mg | Freq: Once | INTRAVENOUS | Status: AC
  Administered 2022-04-03: 200 mg via INTRAVENOUS
  Filled 2022-04-03: qty 200

## 2022-04-03 MED ORDER — CYANOCOBALAMIN 1000 MCG/ML IJ SOLN
1000.0000 ug | Freq: Once | INTRAMUSCULAR | Status: AC
  Administered 2022-04-03: 1000 ug via INTRAMUSCULAR
  Filled 2022-04-03: qty 1

## 2022-04-03 NOTE — Assessment & Plan Note (Signed)
He is going to switch from Xarelto to Eliquis for anticoagulation.

## 2022-04-03 NOTE — Assessment & Plan Note (Signed)
B12 level was pending during his encounter.  He got 1 dose of empiric B12 injection. B12 level came back adequate.  I will hold off additional B12 supplementation.

## 2022-04-03 NOTE — Assessment & Plan Note (Signed)
Encourage oral hydration and avoid nephrotoxins.   

## 2022-04-03 NOTE — Patient Instructions (Signed)

## 2022-04-03 NOTE — Assessment & Plan Note (Signed)
Anemia, likely secondary to chronic kidney disease Labs reviewed and discussed with patient. Hemoglobin has  decreased Ferritin is less than 200.  Iron saturation 17, borderline low. Recommend IV Venofer weekly x 4

## 2022-04-03 NOTE — Progress Notes (Signed)
Hematology/Oncology Progress note [phone] [phone]  CHIEF COMPLAINTS/REASON FOR VISIT:  anemia of chronic kidney disease, B12 deficiency.  ASSESSMENT & PLAN:   Anemia of chronic renal failure Anemia, likely secondary to chronic kidney disease Labs reviewed and discussed with patient. Hemoglobin has  decreased Ferritin is less than 200.  Iron saturation 17, borderline low. Recommend IV Venofer weekly x 4  Chronic kidney disease, stage IV (severe) (HCC) Encourage oral hydration and avoid nephrotoxins.    B12 deficiency B12 level was pending during his encounter.  He got 1 dose of empiric B12 injection. B12 level came back adequate.  I will hold off additional B12 supplementation.  Paroxysmal atrial fibrillation ([place]) He is going to switch from Xarelto to Eliquis for anticoagulation.  Orders Placed This Encounter  Procedures   CBC with Differential/Platelet    Standing Status:   Future    Standing Expiration Date:   04/03/2023   Comprehensive metabolic panel    Standing Status:   Future    Standing Expiration Date:   04/03/2023   Iron and TIBC([facility])    Standing Status:   Future    Standing Expiration Date:   04/04/2023   Ferritin    Standing Status:   Future    Standing Expiration Date:   04/04/2023   Vitamin B12    Standing Status:   Future    Standing Expiration Date:   04/04/2023   Follow-up her LOS. All questions were answered. The patient knows to call the clinic with any problems, questions or concerns.  Earlie Server, MD, PhD [facility] H[facility] Oncology 04/03/2022   HISTORY OF PRESENTING ILLNESS:   Phillip [name] is a  62 y.o.  male with PMH listed below was seen in consultation at the request of  [name] I, MD  for evaluation of anemia of chronic kidney disease.  Patient has chronic kidney disease and follows up with Dr. Candiss Norse.  Baseline creatinine 2.9. History of pulmonary embolism [2010], left lower extremity  DVT[2017], atrial fibrillation, currently on Xarelto 20 mg daily.  Managed by cardiology/CHF clinic [place].  Patient denies any melena, hematochezia.  07/16/2021 CBC showed hemoglobin 10.4, MCV 91, platelet count 10 9000, white count 2.6. Patient denies alcohol use.  He has intentionally lost weight. Patient was referred to establish care with hematology for further evaluation.  INTERVAL HISTORY Phillip [name] is a 62 y.o. male who has above history reviewed by me today presents for follow up visit for management of anemia due to chronic kidney disease, vitamin B12 deficiency. Patient reports feeling well. He is on Xarelto 20 mg for A-fib.  Follows up with [facility] cardiology. Chronic kidney disease.  Follows up with nephrology. Fatigue level has improved.  No other new complaints.  He denies bleeding events.  Review of Systems  Constitutional:  Negative for appetite change, chills, fatigue, fever and unexpected weight change.  HENT:   Negative for hearing loss and voice change.   Eyes:  Negative for eye problems and icterus.  Respiratory:  Negative for chest tightness, cough and shortness of breath.   Cardiovascular:  Negative for chest pain and leg swelling.  Gastrointestinal:  Negative for abdominal distention and abdominal pain.  Endocrine: Negative for hot flashes.  Genitourinary:  Negative for difficulty urinating, dysuria and frequency.   Musculoskeletal:  Negative for arthralgias.  Skin:  Negative for itching and rash.  Neurological:  Negative for light-headedness and numbness.  Hematological:  Negative for adenopathy. Does not bruise/bleed easily.  Psychiatric/Behavioral:  Negative  for confusion.     MEDICAL HISTORY:  Past Medical History:  Diagnosis Date   Asthma    CHF (congestive heart failure) ([place]) 11/15   EF 40-45%, improved with follow-up   Chronic kidney disease    COPD (chronic obstructive pulmonary disease) (HCC)    Deep vein thrombosis (DVT) of lower extremity  ([place]) 08/17/2015   DVT (deep venous thrombosis) (HCC)    Gout    Hypertension    Hypertensive cardiovascular disease    Kidney dysfunction    Morbid obesity (HCC)    OSA on CPAP    Paroxysmal atrial fibrillation (HCC)    PNA (pneumonia)    Pulmonary embolism ([place]) 2010   left leg DVT   Sleep apnea    uses cpap    SURGICAL HISTORY: Past Surgical History:  Procedure Laterality Date   A-FLUTTER ABLATION N/A 07/22/2017   Procedure: A-FLUTTER ABLATION;  Surgeon: Thompson Grayer, MD;  Location: [facility];  Service: Cardiovascular;  Laterality: N/A;   CARDIOVERSION N/A 10/14/2018   Procedure: CARDIOVERSION;  Surgeon: Jolaine Artist, MD;  Location: [facility];  Service: Cardiovascular;  Laterality: N/A;   CARDIOVERSION N/A 04/22/2019   Procedure: CARDIOVERSION;  Surgeon: Thayer Headings, MD;  Location: [facility];  Service: Cardiovascular;  Laterality: N/A;   CARDIOVERSION N/A 05/10/2019   Procedure: CARDIOVERSION;  Surgeon: Jolaine Artist, MD;  Location: [facility];  Service: Cardiovascular;  Laterality: N/A;   CARDIOVERSION N/A 11/23/2020   Procedure: CARDIOVERSION;  Surgeon: Pixie Casino, MD;  Location: [facility];  Service: Cardiovascular;  Laterality: N/A;   CARDIOVERSION N/A 04/16/2021   Procedure: CARDIOVERSION;  Surgeon: Berniece Salines, DO;  Location: [facility];  Service: Cardiovascular;  Laterality: N/A;   COLONOSCOPY WITH PROPOFOL N/A 12/20/2019   Procedure: COLONOSCOPY WITH PROPOFOL;  Surgeon: Yetta Flock, MD;  Location: [facility];  Service: Gastroenterology;  Laterality: N/A;   POLYPECTOMY  12/20/2019   Procedure: POLYPECTOMY;  Surgeon: Yetta Flock, MD;  Location: [facility];  Service: Gastroenterology;;   TEE WITHOUT CARDIOVERSION N/A 03/05/2022   Procedure: TRANSESOPHAGEAL ECHOCARDIOGRAM (TEE);  Surgeon: [name], MD;  Location: [facility];  Service: Cardiovascular;  Laterality: N/A;   UMBILICAL HERNIA REPAIR  1992     SOCIAL HISTORY: Social History   Socioeconomic History   Marital status: Divorced    Spouse name: Not on file   Number of children: 2   Years of education: 12   Highest education level: Not on file  Occupational History   Occupation: Drives dump truck    Comment:     Occupation: Airline pilot    Comment: Disabled  Tobacco Use   Smoking status: Former    Packs/day: 0.25    Years: 1.00    Total pack years: 0.25    Types: Cigarettes    Quit date: 07/23/1989    Years since quitting: 32.7   Smokeless tobacco: Never   Tobacco comments:    Former smoker 03/06/2021  Vaping Use   Vaping Use: Never used  Substance and Sexual Activity   Alcohol use: No    Alcohol/week: 0.0 standard drinks of alcohol   Drug use: No   Sexual activity: Not Currently  Other Topics Concern   Not on file  Social History Narrative   Pt lives in [place], alone.   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   Has 2 sons in [place]      No living will  Would want sons to make decisions for him   Would accept resuscitation   Social Determinants of Health   Financial Resource Strain: Low Risk  (01/19/2018)   Overall Financial Resource Strain (CARDIA)    Difficulty of Paying Living Expenses: Not hard at all  Food Insecurity: No Food Insecurity (01/19/2018)   Hunger Vital Sign    Worried About Running Out of Food in the Last Year: Never true    Ran Out of Food in the Last Year: Never true  Transportation Needs: No Transportation Needs (01/19/2018)   PRAPARE - Hydrologist (Medical): No    Lack of Transportation (Non-Medical): No  Physical Activity: Not on file  Stress: Not on file  Social Connections: Not on file  Intimate Partner Violence: Not on file    FAMILY HISTORY: Family History  Problem Relation Age of Onset   Hypertension Mother    Diabetes Father    CAD Father    Clotting disorder Father    Heart disease Father         transplant   Stomach cancer Father    Allergies Brother    Colon cancer Neg Hx    Colon polyps Neg Hx    Esophageal cancer Neg Hx    Rectal cancer Neg Hx     ALLERGIES:  is allergic to other.  MEDICATIONS:  Current Outpatient Medications  Medication Sig Dispense Refill   albuterol (PROAIR HFA) 108 (90 Base) MCG/ACT inhaler Inhale 2 puffs into the lungs every 4 (four) hours as needed for wheezing or shortness of breath. 1 Inhaler 3   albuterol (PROVENTIL) (2.5 MG/3ML) 0.083% nebulizer solution INHALE 1 VIAL VIA NEBULIZER EVERY 6 HOURS AS NEEDED FOR WHEEZING/SHORTNESS OF BREATH. 375 mL 11   allopurinol (ZYLOPRIM) 300 MG tablet Take 300 mg by mouth daily.     amiodarone (PACERONE) 200 MG tablet TAKE 1 TABLET BY MOUTH EVERY DAY 30 tablet 5   apixaban (ELIQUIS) 5 MG TABS tablet Take 1 tablet (5 mg total) by mouth 2 (two) times daily. 180 tablet 3   calcium carbonate (OS-CAL - DOSED IN MG OF ELEMENTAL CALCIUM) 1250 (500 Ca) MG tablet Take 1 tablet by mouth daily.     carvedilol (COREG) 6.25 MG tablet TAKE 2 TABLETS (12.5 MG TOTAL) BY MOUTH 2 (TWO) TIMES DAILY WITH A MEAL. 120 tablet 5   Cholecalciferol (VITAMIN D3) 50 MCG (2000 UT) TABS Take 2,000 Units by mouth daily.     hydrALAZINE (APRESOLINE) 100 MG tablet Take 1 tablet (100 mg total) by mouth every 8 (eight) hours. 90 tablet 0   isosorbide mononitrate (IMDUR) 120 MG 24 hr tablet Take 1 tablet (120 mg total) by mouth daily. 90 tablet 3   KLOR-CON M20 20 MEQ tablet TAKE 2 TABLETS (40 MEQ) EVERY MORNING AND 1 TABLET (20 MEQ) EVERY EVENING 270 tablet 3   oxymetazoline (AFRIN NASAL SPRAY) 0.05 % nasal spray Place 1 spray into both nostrils 2 (two) times daily as needed for congestion.     SYMBICORT 80-4.5 MCG/ACT inhaler USE 2 INHALATIONS TWICE A DAY (Patient taking differently: Inhale 2 puffs into the lungs 2 (two) times daily.) 30.6 g 3   torsemide (DEMADEX) 20 MG tablet TAKE 2 TABLETS BY MOUTH 2 TIMES DAILY. 180 tablet 3   No current  facility-administered medications for this visit.     PHYSICAL EXAMINATION: ECOG PERFORMANCE STATUS: 1 - Symptomatic but completely ambulatory Vitals:   04/03/22 1418  BP: (!) 138/59  Pulse: (!) 50  Resp: 18  Temp: 98.6 F (37 C)   Filed Weights   04/03/22 1418  Weight: (!) 301 lb (136.5 kg)    Physical Exam Constitutional:      General: He is not in acute distress.    Appearance: He is obese.  HENT:     Head: Normocephalic and atraumatic.  Eyes:     General: No scleral icterus. Cardiovascular:     Rate and Rhythm: Normal rate and regular rhythm.     Heart sounds: Normal heart sounds.  Pulmonary:     Effort: Pulmonary effort is normal. No respiratory distress.     Breath sounds: No wheezing.  Abdominal:     General: Bowel sounds are normal. There is no distension.     Palpations: Abdomen is soft.  Musculoskeletal:        General: No deformity. Normal range of motion.     Cervical back: Normal range of motion and neck supple.  Skin:    General: Skin is warm and dry.     Findings: No erythema or rash.  Neurological:     Mental Status: He is alert and oriented to person, place, and time. Mental status is at baseline.     Cranial Nerves: No cranial nerve deficit.  Psychiatric:        Mood and Affect: Mood normal.     LABORATORY DATA:  I have reviewed the data as listed    Latest Ref Rng & Units 04/03/2022    2:01 PM 03/05/2022   11:38 AM 01/12/2022    5:19 PM  CBC  WBC 4.0 - 10.5 K/uL 3.2   2.9   Hemoglobin 13.0 - 17.0 g/dL 10.5  11.9  10.7   Hematocrit 39.0 - 52.0 % 33.1  35.0  35.6   Platelets 150 - 400 K/uL 136   137     Recent Labs    04/15/21 0500 04/16/21 0416 07/29/21 1125 01/12/22 1719 02/03/22 1118 03/05/22 1138  NA 142   < > 141 140 146* 145  K 3.5   < > 4.0 3.8 4.1 3.6  CL 105   < > 105 103 108 103  CO2 27   < > '31 29 31  '$ --   GLUCOSE 79   < > 78 94 83 83  BUN 38*   < > 39* 31* 38* 35*  CREATININE 2.90*   < > 3.08* 2.78* 3.14* 3.00*   CALCIUM 8.5*   < > 8.6* 8.9 8.8*  --   GFRNONAA 24*   < > 22* 25* 22*  --   PROT 5.0*  --  5.9*  --  5.6*  --   ALBUMIN 3.0*  --  3.5  --  3.3*  --   AST 26  --  18  --  34  --   ALT 36  --  19  --  31  --   ALKPHOS 68  --  57  --  66  --   BILITOT 0.9  --  0.8  --  0.4  --    < > = values in this interval not displayed.    Iron/TIBC/Ferritin/ %Sat    Component Value Date/Time   IRON 49 04/03/2022 1401   IRON 74 03/23/2017 1133   TIBC 288 04/03/2022 1401   TIBC 334 03/23/2017 1133   FERRITIN 55 04/03/2022 1401   IRONPCTSAT 17 (L) 04/03/2022 1401   IRONPCTSAT 22 03/23/2017 1133  RADIOGRAPHIC STUDIES: I have personally reviewed the radiological images as listed and agreed with the findings in the report. ECHO TEE  Result Date: 03/05/2022    TRANSESOPHOGEAL ECHO REPORT   Patient Name:   Phillip [name] Date of Exam: 03/05/2022 Medical Rec #:  [id number]  Height:       66.5 in Accession #:    [id number] Weight:       315.6 lb Date of Birth:  08/21/1960   BSA:          2.441 m Patient Age:    62 years   BP:           144/55 mmHg Patient Gender: M          HR:           54 bpm. Exam Location:  Inpatient Procedure: Transesophageal Echo, 3D Echo, Color Doppler and Cardiac Doppler Indications:    Persistent atrial fibrillation/left atrial mass  History:        Patient has prior history of Echocardiogram examinations, most                 recent 04/15/2021. CHF, COPD, Arrythmias:Atrial Fibrillation,                 Signs/Symptoms:Hypertensive Heart Disease; Risk Factors:Sleep                 Apnea, Obesity and Hypertension. Hx of pulmonary embolus.  Sonographer:    Eartha Inch Referring Phys: [phone] DONNA C CARROLL PROCEDURE: After discussion of the risks and benefits of a TEE, an informed consent was obtained from the patient. TEE procedure time was 9 minutes. The transesophogeal probe was passed without difficulty through the esophogus of the patient. Imaged were  obtained with the patient in  a left lateral decubitus position. Sedation performed by different physician. The patient was monitored while under deep sedation. Anesthestetic sedation was provided intravenously by Anesthesiology: 261.'33mg'$  of Propofol, '50mg'$  of Lidocaine. Image quality was good. The patient's vital signs; including heart rate, blood pressure, and oxygen saturation; remained stable throughout the procedure. The patient developed no complications during the procedure.  IMPRESSIONS  1. Left ventricular ejection fraction, by estimation, is 40 to 45%. The left ventricle has mildly decreased function. The left ventricle demonstrates global hypokinesis.  2. Right ventricular systolic function is normal. The right ventricular size is normal.  3. No LA mass identified. No left atrial/left atrial appendage thrombus was detected.  4. The mitral valve is normal in structure. No evidence of mitral valve regurgitation.  5. Aortic vavle leaflets are not coapting well. Aortic valve regurgitation is mild. Aortic valve sclerosis is present, with no evidence of aortic valve stenosis. Conclusion(s)/Recommendation(s): ?LA mass identified on surface echo likely artifact. FINDINGS  Left Ventricle: Left ventricular ejection fraction, by estimation, is 40 to 45%. The left ventricle has mildly decreased function. The left ventricle demonstrates global hypokinesis. The left ventricular internal cavity size was normal in size. Right Ventricle: The right ventricular size is normal. Right ventricular systolic function is normal. Left Atrium: No LA mass identified. No left atrial/left atrial appendage thrombus was detected. Mitral Valve: The mitral valve is normal in structure. No evidence of mitral valve regurgitation. Tricuspid Valve: The tricuspid valve is normal in structure. Tricuspid valve regurgitation is not demonstrated. Aortic Valve: Aortic vavle leaflets are not coapting well. Aortic valve regurgitation is mild. Aortic valve sclerosis is present,  with no evidence of aortic valve stenosis. Pulmonic Valve: The pulmonic valve was normal  in structure. Pulmonic valve regurgitation is not visualized. IAS/Shunts: No atrial level shunt detected by color flow Doppler. Additional Comments: Spectral Doppler performed. Phineas Inches Electronically signed by Phineas Inches Signature Date/Time: 03/05/2022/3:09:45 PM    Final       ASSESSMENT & PLAN:  1. Thrombocytopenia ([place])   2. Vitamin B12 deficiency   3. Anemia of chronic renal failure, stage 4 (severe) (HCC)   4. Chronic kidney disease, stage IV (severe) ([place])   5. B12 deficiency   6. Paroxysmal atrial fibrillation (HCC)    #Anemia, likely secondary to chronic kidney disease Labs reviewed and discussed with patient. Hemoglobin has improved.  No need for erythropoietin replacement therapy. Ferritin 69, recommend IV Venofer x 2 doses to further improve iron store.   #Vitamin B12 deficiency . thrombocytopenia, and leukopenia,'s counts are stable.  Platelet count has slightly improved.  Neutrophil level has improved.  previous work-up peripheral blood flow cytometry is negative.  Multiple myeloma panel is negative. Continue vitamin B12 injection monthly.   #Chronic anticoagulation in the setting of chronic kidney disease.  I encourage patient to discuss with cardiology regarding Xarelto use and chronic kidney disease.  Eliquis may be a better option of chronic kidney disease being patient with CKD.  -Forwarding my note to his cardiology.  Orders Placed This Encounter  Procedures   CBC with Differential/Platelet    Standing Status:   Future    Standing Expiration Date:   04/03/2023   Comprehensive metabolic panel    Standing Status:   Future    Standing Expiration Date:   04/03/2023   Iron and TIBC([facility])    Standing Status:   Future    Standing Expiration Date:   04/04/2023   Ferritin    Standing Status:   Future    Standing Expiration Date:   04/04/2023   Vitamin B12     Standing Status:   Future    Standing Expiration Date:   04/04/2023    All questions were answered. The patient knows to call the clinic with any problems questions or concerns.  cc [name], MD  [facility], Maricela Bo, FNP   Return of visit: B12 injection today IV venofer today, repeat IV venofer in 2 weeks  B12 monthly x 3.  4 months, labs prior to MD +/- B12 injection/Venofer iron labs + b12  Thank you for this kind referral and the opportunity to participate in the care of this patient. A copy of today's note is routed to referring provider   Earlie Server, MD, PhD [facility] On[facility] 04/03/2022

## 2022-04-04 ENCOUNTER — Telehealth: Payer: Self-pay

## 2022-04-04 NOTE — Telephone Encounter (Signed)
Pt informed of the need for additional venofer infusions.   Please schedule and inform pt of appts:   Venofer weekly x3

## 2022-04-04 NOTE — Telephone Encounter (Signed)
-----  Message from Earlie Server, MD sent at 04/03/2022 10:43 PM EST ----- Please let him know that I recommend addition Venofer weekly x 3.  Keep currently scheduled follow-up appointment.  Thank you.

## 2022-04-07 ENCOUNTER — Other Ambulatory Visit (HOSPITAL_COMMUNITY): Payer: Self-pay | Admitting: Internal Medicine

## 2022-04-10 ENCOUNTER — Telehealth: Payer: Self-pay | Admitting: Oncology

## 2022-04-10 MED FILL — Iron Sucrose Inj 20 MG/ML (Fe Equiv): INTRAVENOUS | Qty: 10 | Status: AC

## 2022-04-10 NOTE — Telephone Encounter (Signed)
spoke with pt who confirmed Venofer appt r/s

## 2022-04-11 ENCOUNTER — Inpatient Hospital Stay: Payer: Managed Care, Other (non HMO)

## 2022-04-15 ENCOUNTER — Inpatient Hospital Stay: Payer: Managed Care, Other (non HMO)

## 2022-04-15 VITALS — BP 132/51 | HR 50 | Temp 96.7°F | Resp 18

## 2022-04-15 DIAGNOSIS — N184 Chronic kidney disease, stage 4 (severe): Secondary | ICD-10-CM | POA: Diagnosis not present

## 2022-04-15 DIAGNOSIS — E538 Deficiency of other specified B group vitamins: Secondary | ICD-10-CM

## 2022-04-15 MED ORDER — SODIUM CHLORIDE 0.9 % IV SOLN
Freq: Once | INTRAVENOUS | Status: AC
  Filled 2022-04-15: qty 250

## 2022-04-15 MED ORDER — SODIUM CHLORIDE 0.9 % IV SOLN
200.0000 mg | Freq: Once | INTRAVENOUS | Status: AC
  Administered 2022-04-15: 200 mg via INTRAVENOUS
  Filled 2022-04-15: qty 200

## 2022-04-15 NOTE — Progress Notes (Signed)
Pt refusing 30 minutes post observation period.

## 2022-04-18 ENCOUNTER — Inpatient Hospital Stay: Payer: Managed Care, Other (non HMO)

## 2022-04-18 VITALS — BP 151/61 | HR 51 | Temp 97.0°F | Resp 18

## 2022-04-18 DIAGNOSIS — N184 Chronic kidney disease, stage 4 (severe): Secondary | ICD-10-CM | POA: Diagnosis not present

## 2022-04-18 DIAGNOSIS — D631 Anemia in chronic kidney disease: Secondary | ICD-10-CM

## 2022-04-18 DIAGNOSIS — E538 Deficiency of other specified B group vitamins: Secondary | ICD-10-CM

## 2022-04-18 MED ORDER — SODIUM CHLORIDE 0.9 % IV SOLN
200.0000 mg | Freq: Once | INTRAVENOUS | Status: AC
  Administered 2022-04-18: 200 mg via INTRAVENOUS
  Filled 2022-04-18: qty 10

## 2022-04-18 MED ORDER — SODIUM CHLORIDE 0.9 % IV SOLN
INTRAVENOUS | Status: DC
  Filled 2022-04-18: qty 250

## 2022-04-18 NOTE — Progress Notes (Signed)
Patient declined to wait the 30 minutes post infusion observation period.

## 2022-04-24 MED FILL — Iron Sucrose Inj 20 MG/ML (Fe Equiv): INTRAVENOUS | Qty: 10 | Status: AC

## 2022-04-25 ENCOUNTER — Inpatient Hospital Stay: Payer: Managed Care, Other (non HMO) | Attending: Oncology

## 2022-04-25 VITALS — BP 153/59 | HR 50 | Resp 18

## 2022-04-25 DIAGNOSIS — E538 Deficiency of other specified B group vitamins: Secondary | ICD-10-CM

## 2022-04-25 DIAGNOSIS — I130 Hypertensive heart and chronic kidney disease with heart failure and stage 1 through stage 4 chronic kidney disease, or unspecified chronic kidney disease: Secondary | ICD-10-CM | POA: Insufficient documentation

## 2022-04-25 DIAGNOSIS — I480 Paroxysmal atrial fibrillation: Secondary | ICD-10-CM | POA: Insufficient documentation

## 2022-04-25 DIAGNOSIS — D696 Thrombocytopenia, unspecified: Secondary | ICD-10-CM | POA: Insufficient documentation

## 2022-04-25 DIAGNOSIS — Z79899 Other long term (current) drug therapy: Secondary | ICD-10-CM | POA: Insufficient documentation

## 2022-04-25 DIAGNOSIS — N184 Chronic kidney disease, stage 4 (severe): Secondary | ICD-10-CM | POA: Diagnosis not present

## 2022-04-25 DIAGNOSIS — I5022 Chronic systolic (congestive) heart failure: Secondary | ICD-10-CM | POA: Insufficient documentation

## 2022-04-25 DIAGNOSIS — D631 Anemia in chronic kidney disease: Secondary | ICD-10-CM | POA: Diagnosis present

## 2022-04-25 MED ORDER — SODIUM CHLORIDE 0.9 % IV SOLN
Freq: Once | INTRAVENOUS | Status: AC
  Filled 2022-04-25: qty 250

## 2022-04-25 MED ORDER — SODIUM CHLORIDE 0.9 % IV SOLN
200.0000 mg | Freq: Once | INTRAVENOUS | Status: AC
  Administered 2022-04-25: 200 mg via INTRAVENOUS
  Filled 2022-04-25: qty 200

## 2022-04-27 ENCOUNTER — Other Ambulatory Visit: Payer: Self-pay | Admitting: Internal Medicine

## 2022-04-29 ENCOUNTER — Other Ambulatory Visit (HOSPITAL_COMMUNITY): Payer: Self-pay | Admitting: *Deleted

## 2022-04-29 ENCOUNTER — Other Ambulatory Visit: Payer: Self-pay

## 2022-04-29 MED ORDER — AMIODARONE HCL 200 MG PO TABS: 200.0000 mg | ORAL_TABLET | Freq: Every day | ORAL | 1 refills | Status: DC

## 2022-04-29 NOTE — Telephone Encounter (Signed)
Received refill request from [facility] for Carvedilol and torsemide for mail order. His cardiologist has been writing these. Forwarding to him.

## 2022-05-01 ENCOUNTER — Encounter (HOSPITAL_COMMUNITY): Payer: Self-pay | Admitting: *Deleted

## 2022-05-02 ENCOUNTER — Ambulatory Visit: Payer: Managed Care, Other (non HMO) | Admitting: Nurse Practitioner

## 2022-05-02 ENCOUNTER — Encounter: Payer: Self-pay | Admitting: Nurse Practitioner

## 2022-05-02 ENCOUNTER — Telehealth: Payer: Self-pay

## 2022-05-02 VITALS — BP 136/60 | HR 61 | Temp 97.8°F | Resp 16 | Ht 66.75 in | Wt 307.2 lb

## 2022-05-02 DIAGNOSIS — J029 Acute pharyngitis, unspecified: Secondary | ICD-10-CM | POA: Insufficient documentation

## 2022-05-02 LAB — POC COVID19 BINAXNOW: SARS Coronavirus 2 Ag: NEGATIVE

## 2022-05-02 LAB — POCT RAPID STREP A (OFFICE): Rapid Strep A Screen: NEGATIVE

## 2022-05-02 MED ORDER — FLUTICASONE PROPIONATE 50 MCG/ACT NA SUSP: 2.0000 | Freq: Every day | NASAL | 0 refills | Status: DC

## 2022-05-02 NOTE — Assessment & Plan Note (Signed)
COVID and strep test in office. 

## 2022-05-02 NOTE — Assessment & Plan Note (Signed)
COVID and strep test negative in office.  Likely viral pharyngitis tonsil stone appreciated.  No PTA noted.  Patient can use over-the-counter analgesics such as Tylenol, warm salt water gargle, hot tea, ice chips, throat lozenges.  Inform patient this can take approximate 1 week to clear out.  Follow-up if no improvement

## 2022-05-02 NOTE — Patient Instructions (Signed)
Nice to see you today The covid and strep test was negative in office today Follow up if it does not improve This could take a week to move out You can use throat lozenges, warm salt water gargles, hot tea, and ice chips to help with the sore throat. You can always use tylenol if you need it too

## 2022-05-02 NOTE — Telephone Encounter (Signed)
Per appt notes pt already has scheduled appt with Romilda Garret NP 05/02/22 at 9:00 am. Sending note to Romilda Garret NP and [place] pool.

## 2022-05-02 NOTE — Progress Notes (Signed)
Acute Office Visit  Subjective:     Patient ID: Phillip [name], male    DOB: [birth year], 62 y.o.   MRN: [id number]  Chief Complaint  Patient presents with   Sore Throat    Started last night     Patient is in today for sore throat with a history of SVT, P-AFIB, HTN, CHF, OSA on CPAP, Asthma, CKD, anticoagulated, and morbid obesity   Symptoms started last night States no sick contacts Covid vaccine: pfixer x2 and booster Flu vaccine: UTD Pna vaccine: UTD Nothing over the counter for his sympotms.   States that he woke up around 330am and he went to swallow and it hurt.  Review of Systems  Constitutional:  Negative for chills and fever.       Appetite is normal Fluid  intake is normal for patient   HENT:  Positive for ear pain and sore throat. Negative for ear discharge and sinus pain.   Respiratory:  Positive for cough and shortness of breath (doe).   Musculoskeletal:  Negative for joint pain and myalgias.  Neurological:  Negative for headaches.        Objective:    BP 136/60   Pulse 61   Temp 97.8 F (36.6 C)   Resp 16   Ht 5' 6.75" (1.695 m)   Wt (!) 307 lb 4 oz (139.4 kg)   SpO2 93%   BMI 48.48 kg/m  BP Readings from Last 3 Encounters:  05/02/22 136/60  04/25/22 (!) 153/59  04/18/22 (!) 151/61   Wt Readings from Last 3 Encounters:  05/02/22 (!) 307 lb 4 oz (139.4 kg)  04/03/22 (!) 301 lb (136.5 kg)  03/31/22 (!) 303 lb 6.4 oz (137.6 kg)      Physical Exam Vitals and nursing note reviewed.  Constitutional:      Appearance: Normal appearance. He is obese.  HENT:     Right Ear: Tympanic membrane, ear canal and external ear normal.     Left Ear: Tympanic membrane, ear canal and external ear normal.     Mouth/Throat:     Mouth: Mucous membranes are moist.     Pharynx: Posterior oropharyngeal erythema present.     Tonsils: No tonsillar exudate or tonsillar abscesses. 1+ on the right. 1+ on the left.   Cardiovascular:     Rate and Rhythm: Normal rate  and regular rhythm.     Heart sounds: Normal heart sounds.  Pulmonary:     Effort: Pulmonary effort is normal.     Breath sounds: Normal breath sounds.  Abdominal:     General: Bowel sounds are normal.  Lymphadenopathy:     Cervical: No cervical adenopathy.  Neurological:     Mental Status: He is alert.     No results found for any visits on 05/02/22.      Assessment & Plan:   Problem List Items Addressed This Visit       Respiratory   Viral pharyngitis    COVID and strep test negative in office.  Likely viral pharyngitis tonsil stone appreciated.  No PTA noted.  Patient can use over-the-counter analgesics such as Tylenol, warm salt water gargle, hot tea, ice chips, throat lozenges.  Inform patient this can take approximate 1 week to clear out.  Follow-up if no improvement      Relevant Medications   fluticasone (FLONASE) 50 MCG/ACT nasal spray     Other   Sore throat - Primary    COVID and strep test in  office      Relevant Orders   POCT rapid strep A   POC COVID-19 BinaxNow    Meds ordered this encounter  Medications   fluticasone (FLONASE) 50 MCG/ACT nasal spray    Sig: Place 2 sprays into both nostrils daily.    Dispense:  16 g    Refill:  0    Order Specific Question:   Supervising Provider    Answer:   TOWER, MARNE A [[id number]    Return if symptoms worsen or fail to improve.  Romilda Garret, NP

## 2022-05-02 NOTE — Telephone Encounter (Signed)
Per chart review tab pt was already seen 05/02/22.

## 2022-05-02 NOTE — Telephone Encounter (Signed)
[place] Night - Client TELEPHONE ADVICE RECORD [facility] Patient Name: Phillip [name] VO ID Gender: Male DOB: 10/19/1960 Age: 62 Y 9 M 6 D Return Phone Number: [phone] (Primary) Address: [name]  [address] Client Phillip [name] [facility] [name] Night - Client Client Site [place] Provider Phillip [name]- MD Contact Type Call Who Is Calling Patient / Member / Family / Caregiver Call Type Triage / Clinical Relationship To Patient Self Return Phone Number [phone] (Primary) Chief Complaint Swallowing Difficulty Reason for Call Symptomatic / Request for Phillip [name] states he can barely swallow He knows it's strep throat. the back of his throat is discolored Translation No Nurse Assessment Nurse: Phillip Sa, RN, Phillip [name] Date/Time (Eastern Time): 05/02/2022 7:05:15 AM Confirm and document reason for call. If symptomatic, describe symptoms. ---Caller states he developed a sore throat (current pain rated as an 8 on the 1 to 10 scale) yesterday. He can feel enlarged lymph nodes in his neck. No severe breathing difficulty. No severe drooling or severe swallowing difficulty. He feels like he has a low grade fever. Alert and responsive. Does the patient have any new or worsening symptoms? ---Yes Will a triage be completed? ---Yes Related visit to physician within the last 2 weeks? ---No Does the PT have any chronic conditions? (i.e. diabetes, asthma, this includes High risk factors for pregnancy, etc.) ---Yes List chronic conditions. ---CHF, High Blood Pressure, Takes blood Thinners, Kidney Disease Is this a behavioral health or substance abuse call? ---No Guidelines Guideline Title Affirmed Question Affirmed Notes Nurse Date/Time (Eastern Time) Sore Throat [1] Difficulty breathing AND [2] not severe Phillip Sa, RN, Phillip [name] 05/02/2022 7:10:28 AM Disp. Time  Phillip [name] Time) Disposition Final User 05/02/2022 7:12:52 AM Go to ED Now (or PCP triage) Yes Phillip Sa, RN, Phillip [name] PLEASE NOTE: All timestamps contained within this report are represented as [place] Standard Time. CONFIDENTIALTY NOTICE: This fax transmission is intended only for the addressee. It contains information that is legally privileged, confidential or otherwise protected from use or disclosure. If you are not the intended recipient, you are strictly prohibited from reviewing, disclosing, copying using or disseminating any of this information or taking any action in reliance on or regarding this information. If you have received this fax in error, please notify us immediately by telephone so that we can arrange for its return to [place]. Phone: [phone], Toll-Free: [phone], Fax: [phone] Page: 2 of 2 Call Id: [id number] Final Disposition 05/02/2022 7:12:52 AM Go to ED Now (or PCP triage) Yes Phillip Sa, RN, Phillip [name] Disagree/Comply Disagree Caller Understands Yes PreDisposition Go to ED Care Advice Given Per Guideline GO TO ED NOW (OR PCP TRIAGE): * IF NO PCP (PRIMARY CARE PROVIDER) SECOND-LEVEL TRIAGE: You need to be seen within the next hour. Go to the [facility] at _____________ [place] as soon as you can. CARE ADVICE given per Sore Throat (Adult) guideline. Comments User: Phillip Mount, RN Date/Time Phillip [name] Time): 05/02/2022 7:15:04 AM Phillip [name] states that he is having difficulty swallowing and some mild breathing difficulty this morning. He states he has worked in [employer] for 37 years and that he doesn't want to go to the ED. He plans to call the office at 8am to see if Dr. Silvio [name] will call in antibiotics without seeing him. Referrals GO TO FACILITY REFUSE

## 2022-05-16 ENCOUNTER — Other Ambulatory Visit (HOSPITAL_COMMUNITY): Payer: Self-pay

## 2022-05-16 ENCOUNTER — Telehealth: Payer: Self-pay | Admitting: Internal Medicine

## 2022-05-16 MED ORDER — ISOSORBIDE MONONITRATE ER 120 MG PO TB24: 120.0000 mg | ORAL_TABLET | Freq: Every day | ORAL | 3 refills | Status: DC

## 2022-05-16 MED ORDER — CARVEDILOL 6.25 MG PO TABS: 12.5000 mg | ORAL_TABLET | Freq: Two times a day (BID) | ORAL | 5 refills | Status: DC

## 2022-05-16 MED ORDER — TORSEMIDE 20 MG PO TABS: 40.0000 mg | ORAL_TABLET | Freq: Two times a day (BID) | ORAL | 3 refills | Status: DC

## 2022-05-16 NOTE — Telephone Encounter (Signed)
Pt called in stated he need prior authorization for  RX torsemide (DEMADEX) 20 MG tablet   carvedilol (COREG) 6.25 MG tablet  isosorbide mononitrate (IMDUR) 120 MG 24 hr tablet   for [facility] -  Please advise [phone]

## 2022-05-16 NOTE — Telephone Encounter (Signed)
All of these are from cardiology. Called and advised pt he needs to contact them.

## 2022-05-24 ENCOUNTER — Other Ambulatory Visit: Payer: Self-pay | Admitting: Nurse Practitioner

## 2022-05-24 DIAGNOSIS — J029 Acute pharyngitis, unspecified: Secondary | ICD-10-CM

## 2022-06-02 NOTE — Progress Notes (Signed)
Advanced Heart Failure Clinic Note   Primary Care: [name], MD Primary Cardiologist: Dr. [name]  EP: Dr Allred Nephrology: Dr. Candiss Norse HF Cardiologist: Dr Haroldine Laws   HPI: Phillip [name] is a 62 y.o. male hx of PAF, remote PE, hx of DVT, OSA, morbid obesity, CKD Stage III,  HTN, and  combined chronic systolic/diastolic. S/P A Flutter ablation May 2019 .     EF in 2015 40-45% Echo 12/21/16 with EF 55-60% G2DD, mild aortic regurgitation.  Last nuc 2008 no ischemia, no hx of cath due to CKD.    Developed AFL and underwent AFL ablation in 5/19.  Seen 7/20 and was feeling bad. Found to be in AFL/AF in 80s. Echo at that time with EF back down to 30%. Underwent DC-CV on 10/14/18 and referred to AF Clinic.   Echo 11/20 EF 45-50%  Developed recurrent AF at the end of January 2021. Underwent attempt at [facility] on 04/22/19 but failed to convert. Seen back in AF Clinic on 2/5. Not able to use Tikosyn due to long QT. Not ablation candidate due to obesity. Considered amiodarone. Started on flecainide but had treadmill to monitor Flecainide Tx.   Patient had significant QRS widening within first minute of exercise.  Flecainide stopped  Echo 5/21 EF 45-50%  Recurrent AF in 9/22. Admitted. Had DC-CV. Amiodarone started.   Echo 9/22 EF 45-50% Marked LAE   Admitted 1/23 with a/c CHF and afib RVR. Echo EF 40-45%, global LV HK, RV ok. Diuresed and underwent DCCV.   Had AF ablation by Dr. Rodena Goldmann at [facility] 07/23. Saw Afib clinic 08/23 and in rate controlled AF. Worse 7 day monitor - rhythm was sinus with 14% AF burden.  Seen in ED 01/12/22 with a/c CHF. States he had been drinking a lot of fluid. Given 80 mg IV lasix. He diuresed well and was felt to be stable for discharge home.   Follow up 11/23, NYHA II and volume stable. Saw EP 1/24, not ablation candidate yet due to weight. Continue amiodarone.  Today he returns for HF follow up. Overall feeling fine, main complaint is chest and nasal congestion x  4 days. Has abdominal swelling and leg swelling.  He has SOB walking up steps. Works driving dump truck. Denies palpitations, abnormal bleeding, CP, dizziness,  or PND/Orthopnea. Appetite ok. No fever or chills. Weight at home 311 pounds. Taking all medications. Takes extra torsemide for a couple days about every other week. Wears CPAP every night.    Cardiac Studies: - Last nuc 2008 no ischemia  - Echo 2015 EF 40-45%  - Echo 12/21/16 with EF 55-60% G2DD, mild aortic regurgitation.    - Echo 7/20 EF 30%  - Echo 11/20 EF 45-50%  - Echo 5/21 EF 45-50%  - Echo 9/22 EF 45-50% Marked LAE   - Echo 1/23 EF 40-45%, global LV HK, RV ok.   Review of systems complete and found to be negative unless listed in HPI.    Past Medical History:  Diagnosis Date   Asthma    CHF (congestive heart failure) ([facility]) 11/15   EF 40-45%, improved with follow-up   Chronic kidney disease    COPD (chronic obstructive pulmonary disease) (HCC)    Deep vein thrombosis (DVT) of lower extremity ([facility]) 08/17/2015   DVT (deep venous thrombosis) (HCC)    Gout    Hypertension    Hypertensive cardiovascular disease    Kidney dysfunction    Morbid obesity (HCC)    OSA on CPAP  Paroxysmal atrial fibrillation (HCC)    PNA (pneumonia)    Pulmonary embolism ([facility]) 2010   left leg DVT   Sleep apnea    uses cpap   Current Outpatient Medications  Medication Sig Dispense Refill   albuterol (PROAIR HFA) 108 (90 Base) MCG/ACT inhaler Inhale 2 puffs into the lungs every 4 (four) hours as needed for wheezing or shortness of breath. 1 Inhaler 3   albuterol (PROVENTIL) (2.5 MG/3ML) 0.083% nebulizer solution INHALE 1 VIAL VIA NEBULIZER EVERY 6 HOURS AS NEEDED FOR WHEEZING/SHORTNESS OF BREATH. 375 mL 11   allopurinol (ZYLOPRIM) 300 MG tablet TAKE 1 TABLET BY MOUTH EVERY DAY 90 tablet 3   amiodarone (PACERONE) 200 MG tablet Take 1 tablet (200 mg total) by mouth daily. 90 tablet 1   apixaban (ELIQUIS) 5 MG TABS tablet Take 1  tablet (5 mg total) by mouth 2 (two) times daily. 180 tablet 3   calcium carbonate (OS-CAL - DOSED IN MG OF ELEMENTAL CALCIUM) 1250 (500 Ca) MG tablet Take 1 tablet by mouth daily.     carvedilol (COREG) 6.25 MG tablet Take 2 tablets (12.5 mg total) by mouth 2 (two) times daily with a meal. 120 tablet 5   Cholecalciferol (VITAMIN D3) 50 MCG (2000 UT) TABS Take 2,000 Units by mouth daily.     hydrALAZINE (APRESOLINE) 100 MG tablet Take 1 tablet (100 mg total) by mouth every 8 (eight) hours. 90 tablet 0   isosorbide mononitrate (IMDUR) 120 MG 24 hr tablet Take 1 tablet (120 mg total) by mouth daily. 90 tablet 3   KLOR-CON M20 20 MEQ tablet TAKE 2 TABLETS (40 MEQ) EVERY MORNING AND 1 TABLET (20 MEQ) EVERY EVENING 270 tablet 3   oxymetazoline (AFRIN NASAL SPRAY) 0.05 % nasal spray Place 1 spray into both nostrils 2 (two) times daily as needed for congestion.     SYMBICORT 80-4.5 MCG/ACT inhaler USE 2 INHALATIONS TWICE A DAY (Patient taking differently: Inhale 2 puffs into the lungs 2 (two) times daily.) 30.6 g 3   torsemide (DEMADEX) 20 MG tablet Take 2 tablets (40 mg total) by mouth 2 (two) times daily. 180 tablet 3   No current facility-administered medications for this encounter.   Allergies  Allergen Reactions   Other Anaphylaxis    mushrooms    Social History   Socioeconomic History   Marital status: Divorced    Spouse name: Not on file   Number of children: 2   Years of education: 12   Highest education level: Not on file  Occupational History   Occupation: Drives dump truck    Comment:     Occupation: Airline pilot    Comment: Disabled  Tobacco Use   Smoking status: Former    Packs/day: 0.25    Years: 1.00    Total pack years: 0.25    Types: Cigarettes    Quit date: 07/23/1989    Years since quitting: 32.8   Smokeless tobacco: Never   Tobacco comments:    Former smoker 03/06/2021  Vaping Use   Vaping Use: Never used  Substance and Sexual Activity   Alcohol use: No     Alcohol/week: 0.0 standard drinks of alcohol   Drug use: No   Sexual activity: Not Currently  Other Topics Concern   Not on file  Social History Narrative   Pt lives in [place], alone.   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   Has 2 sons in [place]  No living will   Would want sons to make decisions for him   Would accept resuscitation   Social Determinants of Health   Financial Resource Strain: Low Risk  (01/19/2018)   Overall Financial Resource Strain (CARDIA)    Difficulty of Paying Living Expenses: Not hard at all  Food Insecurity: No Food Insecurity (01/19/2018)   Hunger Vital Sign    Worried About Running Out of Food in the Last Year: Never true    Ran Out of Food in the Last Year: Never true  Transportation Needs: No Transportation Needs (01/19/2018)   PRAPARE - Hydrologist (Medical): No    Lack of Transportation (Non-Medical): No  Physical Activity: Not on file  Stress: Not on file  Social Connections: Not on file  Intimate Partner Violence: Not on file    Family History  Problem Relation Age of Onset   Hypertension Mother    Diabetes Father    CAD Father    Clotting disorder Father    Heart disease Father        transplant   Stomach cancer Father    Allergies Brother    Colon cancer Neg Hx    Colon polyps Neg Hx    Esophageal cancer Neg Hx    Rectal cancer Neg Hx    BP (!) 128/54   Pulse (!) 50   Wt (!) 145.9 kg (321 lb 9.6 oz)   SpO2 93%   BMI 50.75 kg/m   Wt Readings from Last 3 Encounters:  06/03/22 (!) 145.9 kg (321 lb 9.6 oz)  05/02/22 (!) 139.4 kg (307 lb 4 oz)  04/03/22 (!) 136.5 kg (301 lb)   PHYSICAL EXAM: General:  NAD. No resp difficulty, walked into clinic HEENT: Normal Neck: Supple. No JVD, thick neck. Carotids 2+ bilat; no bruits. No lymphadenopathy or thryomegaly appreciated. Cor: PMI nondisplaced. Regular rate & rhythm. No rubs, gallops or murmurs. Lungs: +  rhonchi throughout Abdomen: Soft, obese, nontender, nondistended. No hepatosplenomegaly. No bruits or masses. Good bowel sounds. Extremities: No cyanosis, clubbing, rash, 1+ BLE pre-tibial edema L>R Neuro: Alert & oriented x 3, cranial nerves grossly intact. Moves all 4 extremities w/o difficulty. Affect pleasant.  ReDs: 43%  ASSESSMENT & PLAN:  1. Chronic HFmEF - Echo 11/2016 LVEF 55-60%, Grade 2 DD. - EF 7/20 back down to 30%. Suspect due to AF but rate not overly elevated. HTN may also be playing a role. - Symptoms improved after DC-CV on 10/11/18  - Echo 11/20 EF 45-50% - Echo 5/21 EF 45-50% - Echo 9/22 EF 45-50% - Echo 1/23 EF 40-45%, global LV HK, RV ok. - Worse NYHA II-early III, volume up a little today, REDs 43%. GDMT limited by CKD - Increase torsemide to 60 mg bid x 3 days, then back to 40 mg bid - Continue Coreg 12.5 mg bid. - Continue hydralazine 100 mg tid + Imdur 120 mg daily. - Off Farxiga due to AKI and refuses to restart due to cost. - Has not had cath due to CKD and lack of clear anginal symptoms.  - No ACE/ARB with CKD (creatinine has run 2.5-3.3) - Labs today - Rhonchi on exam with congestion, will obtain CXR. - Given Rx for compression hose  2. PAF / Typical A-flutter, paroxysmal - S/P AFL ablation 07/2017. - s/p DC-CV for AFib on 10/14/18 and 05/10/19 and 9/22 - Amio started 9/22 - S/p AF ablation 07/23 by Dr. Lenard Galloway - He reports his Nephrologist suggested  considering Eliquis instead of Xarelto. Will switch to Eliquis 5 mg BID. - He is compliant with CPAP. - Regular on exam today. - Continue amiodarone 200 mg daily. - Given young age would like to avoid long-term exposure to [facility].  Check amiodarone labs today - Saw EP, not an ablation candidate until weight closer to 250 lbs.  3. Hypertension - BP ok today. - Meds as above.  4. Morbid obesity - Body mass index is 50.75 kg/m.  - He has lost over 100 pounds. Congratulated him. Recently gained some  weight back, but is getting diet on track. - Has not been interested in [place].  5. CKD IV - Baseline SCr ~2.5 -3.0  - Follows with Dr. Candiss Norse.  - Labs today  6. OSA on CPAP - Compliant with CPAP.  - No change  7. Iron deficiency anemia - Getting Venofer  - Denies recent bleeding issues - Followed by Heme/Onc  Follow up in 4 months with Dr. Haroldine Laws  Rafael Bihari, FNP  11:17 AM

## 2022-06-03 ENCOUNTER — Ambulatory Visit (HOSPITAL_COMMUNITY)
Admission: RE | Admit: 2022-06-03 | Discharge: 2022-06-03 | Disposition: A | Discharge status: Home / Self Care | Payer: Managed Care, Other (non HMO) | Source: Ambulatory Visit | Attending: Family Medicine | Admitting: Family Medicine

## 2022-06-03 ENCOUNTER — Encounter (HOSPITAL_COMMUNITY): Payer: Self-pay

## 2022-06-03 ENCOUNTER — Telehealth (HOSPITAL_COMMUNITY): Payer: Self-pay | Admitting: *Deleted

## 2022-06-03 VITALS — BP 128/54 | HR 50 | Wt 321.6 lb

## 2022-06-03 DIAGNOSIS — I130 Hypertensive heart and chronic kidney disease with heart failure and stage 1 through stage 4 chronic kidney disease, or unspecified chronic kidney disease: Secondary | ICD-10-CM | POA: Diagnosis not present

## 2022-06-03 DIAGNOSIS — I4819 Other persistent atrial fibrillation: Secondary | ICD-10-CM

## 2022-06-03 DIAGNOSIS — G4733 Obstructive sleep apnea (adult) (pediatric): Secondary | ICD-10-CM | POA: Insufficient documentation

## 2022-06-03 DIAGNOSIS — E876 Hypokalemia: Secondary | ICD-10-CM

## 2022-06-03 DIAGNOSIS — N184 Chronic kidney disease, stage 4 (severe): Secondary | ICD-10-CM | POA: Insufficient documentation

## 2022-06-03 DIAGNOSIS — Z86718 Personal history of other venous thrombosis and embolism: Secondary | ICD-10-CM | POA: Diagnosis not present

## 2022-06-03 DIAGNOSIS — D509 Iron deficiency anemia, unspecified: Secondary | ICD-10-CM | POA: Insufficient documentation

## 2022-06-03 DIAGNOSIS — I5022 Chronic systolic (congestive) heart failure: Secondary | ICD-10-CM | POA: Insufficient documentation

## 2022-06-03 DIAGNOSIS — Z87891 Personal history of nicotine dependence: Secondary | ICD-10-CM | POA: Diagnosis not present

## 2022-06-03 DIAGNOSIS — R051 Acute cough: Secondary | ICD-10-CM

## 2022-06-03 DIAGNOSIS — I483 Typical atrial flutter: Secondary | ICD-10-CM | POA: Diagnosis not present

## 2022-06-03 DIAGNOSIS — R1900 Intra-abdominal and pelvic swelling, mass and lump, unspecified site: Secondary | ICD-10-CM | POA: Diagnosis not present

## 2022-06-03 DIAGNOSIS — I480 Paroxysmal atrial fibrillation: Secondary | ICD-10-CM | POA: Diagnosis not present

## 2022-06-03 DIAGNOSIS — Z79899 Other long term (current) drug therapy: Secondary | ICD-10-CM

## 2022-06-03 DIAGNOSIS — Z6841 Body Mass Index (BMI) 40.0 and over, adult: Secondary | ICD-10-CM | POA: Diagnosis not present

## 2022-06-03 DIAGNOSIS — I5042 Chronic combined systolic (congestive) and diastolic (congestive) heart failure: Secondary | ICD-10-CM

## 2022-06-03 DIAGNOSIS — Z8249 Family history of ischemic heart disease and other diseases of the circulatory system: Secondary | ICD-10-CM | POA: Diagnosis not present

## 2022-06-03 DIAGNOSIS — I10 Essential (primary) hypertension: Secondary | ICD-10-CM | POA: Diagnosis not present

## 2022-06-03 DIAGNOSIS — Z7901 Long term (current) use of anticoagulants: Secondary | ICD-10-CM | POA: Insufficient documentation

## 2022-06-03 LAB — CBC
HCT: 32.2 % — ABNORMAL LOW (ref 39.0–52.0)
Hemoglobin: 10.2 g/dL — ABNORMAL LOW (ref 13.0–17.0)
MCH: 30.4 pg (ref 26.0–34.0)
MCHC: 31.7 g/dL (ref 30.0–36.0)
MCV: 95.8 fL (ref 80.0–100.0)
Platelets: 100 10*3/uL — ABNORMAL LOW (ref 150–400)
RBC: 3.36 MIL/uL — ABNORMAL LOW (ref 4.22–5.81)
RDW: 16.4 % — ABNORMAL HIGH (ref 11.5–15.5)
WBC: 2.7 10*3/uL — ABNORMAL LOW (ref 4.0–10.5)
nRBC: 0 % (ref 0.0–0.2)

## 2022-06-03 LAB — BASIC METABOLIC PANEL
Anion gap: 6 (ref 5–15)
BUN: 35 mg/dL — ABNORMAL HIGH (ref 6–20)
CO2: 31 mmol/L (ref 22–32)
Calcium: 8.7 mg/dL — ABNORMAL LOW (ref 8.9–10.3)
Chloride: 107 mmol/L (ref 98–111)
Creatinine, Ser: 2.98 mg/dL — ABNORMAL HIGH (ref 0.61–1.24)
GFR, Estimated: 23 mL/min — ABNORMAL LOW (ref >60)
Glucose, Bld: 88 mg/dL (ref 70–99)
Potassium: 4.2 mmol/L (ref 3.5–5.1)
Sodium: 144 mmol/L (ref 135–145)

## 2022-06-03 LAB — BRAIN NATRIURETIC PEPTIDE: B Natriuretic Peptide: 2315.3 pg/mL — ABNORMAL HIGH (ref 0.0–100.0)

## 2022-06-03 MED ORDER — TORSEMIDE 20 MG PO TABS: 60.0000 mg | ORAL_TABLET | Freq: Two times a day (BID) | ORAL | 11 refills | Status: DC

## 2022-06-03 MED ORDER — POTASSIUM CHLORIDE CRYS ER 20 MEQ PO TBCR: 40.0000 meq | EXTENDED_RELEASE_TABLET | Freq: Two times a day (BID) | ORAL | 3 refills | Status: DC

## 2022-06-03 MED ORDER — METOLAZONE 2.5 MG PO TABS: 2.5000 mg | ORAL_TABLET | ORAL | 1 refills | Status: DC

## 2022-06-03 NOTE — Progress Notes (Signed)
ReDS Vest / Clip - 06/03/22 1120       ReDS Vest / Clip   Station Marker D    Ruler Value 38.5    ReDS Value Range High volume overload    ReDS Actual Value 43    Anatomical Comments sitting

## 2022-06-03 NOTE — Telephone Encounter (Signed)
Called patient per Allena Katz, NP with following lab results and instructions:  "BNP elevated, suggesting fluid overload. Keep torsemide at 60 mg bid (do not decrease back to 40 bid as discussed at visit)  Increase KCL to 40 bid  Take metolazone 2.5 mg x 1 with extra 20 KCL x 1 (OK to take with tomorrow's morning dose of torsemide).  Repeat BMET in 1 week and arrange APP follow up in 3 weeks. Want to keep a close eye on him with his fluid.

## 2022-06-03 NOTE — Patient Instructions (Addendum)
Thank you for coming in today  Labs were done today, if any labs are abnormal the clinic will call you No news is good news  Medications: Increase Torsemide to 60 mg twice daily for 3 days and then go back to 40 mg twice daily   You have been given a prescription for compression hose. And a list of places who supply them. Please wear your compression hose daily, place them on as soon as you get up in the morning and remove before you go to bed at night.    Follow up appointments:  Your physician recommends that you schedule a follow-up appointment in:  4 months with Dr. Haroldine Laws  You will receive a reminder letter in the mail a few months in advance. If you don't receive a letter, please call our office to schedule the follow-up appointment.     Do the following things EVERYDAY: Weigh yourself in the morning before breakfast. Write it down and keep it in a log. Take your medicines as prescribed Eat low salt foods--Limit salt (sodium) to 2000 mg per day.  Stay as active as you can everyday Limit all fluids for the day to less than 2 liters   At the [facility], you and your health needs are our priority. As part of our continuing mission to provide you with exceptional heart care, we have created designated Provider Care Teams. These Care Teams include your primary Cardiologist (physician) and Advanced Practice Providers (APPs- Physician Assistants and Nurse Practitioners) who all work together to provide you with the care you need, when you need it.   You may see any of the following providers on your designated Care Team at your next follow up: Dr Glori Bickers Dr Loralie Champagne Dr. Roxana Hires, NP Lyda Jester, [place] [facility] [place], [place] Forestine Na, NP Audry Riles, PharmD   Please be sure to bring in all your medications bottles to every appointment.    Thank you for choosing [facility]  If you have any questions or concerns before your next appointment please send [place] a message through [place] or call our office at [phone].    TO LEAVE A MESSAGE FOR THE NURSE SELECT OPTION 2, PLEASE LEAVE A MESSAGE INCLUDING: YOUR NAME DATE OF BIRTH CALL BACK NUMBER REASON FOR CALL**this is important as we prioritize the call backs  YOU WILL RECEIVE A CALL BACK THE SAME DAY AS LONG AS YOU CALL BEFORE 4:00 PM

## 2022-06-11 ENCOUNTER — Ambulatory Visit (HOSPITAL_COMMUNITY)
Admission: RE | Admit: 2022-06-11 | Discharge: 2022-06-11 | Disposition: A | Discharge status: Home / Self Care | Payer: Managed Care, Other (non HMO) | Source: Ambulatory Visit | Attending: Cardiology | Admitting: Cardiology

## 2022-06-11 DIAGNOSIS — E876 Hypokalemia: Secondary | ICD-10-CM | POA: Diagnosis not present

## 2022-06-11 DIAGNOSIS — Z79899 Other long term (current) drug therapy: Secondary | ICD-10-CM | POA: Insufficient documentation

## 2022-06-11 DIAGNOSIS — I5022 Chronic systolic (congestive) heart failure: Secondary | ICD-10-CM | POA: Diagnosis not present

## 2022-06-11 LAB — BASIC METABOLIC PANEL
Anion gap: 8 (ref 5–15)
BUN: 46 mg/dL — ABNORMAL HIGH (ref 6–20)
CO2: 34 mmol/L — ABNORMAL HIGH (ref 22–32)
Calcium: 9.1 mg/dL (ref 8.9–10.3)
Chloride: 98 mmol/L (ref 98–111)
Creatinine, Ser: 3.37 mg/dL — ABNORMAL HIGH (ref 0.61–1.24)
GFR, Estimated: 20 mL/min — ABNORMAL LOW (ref >60)
Glucose, Bld: 87 mg/dL (ref 70–99)
Potassium: 3.3 mmol/L — ABNORMAL LOW (ref 3.5–5.1)
Sodium: 140 mmol/L (ref 135–145)

## 2022-06-12 ENCOUNTER — Telehealth (HOSPITAL_COMMUNITY): Payer: Self-pay

## 2022-06-12 DIAGNOSIS — E876 Hypokalemia: Secondary | ICD-10-CM

## 2022-06-12 DIAGNOSIS — I5042 Chronic combined systolic (congestive) and diastolic (congestive) heart failure: Secondary | ICD-10-CM

## 2022-06-12 DIAGNOSIS — Z79899 Other long term (current) drug therapy: Secondary | ICD-10-CM

## 2022-06-12 DIAGNOSIS — I5022 Chronic systolic (congestive) heart failure: Secondary | ICD-10-CM

## 2022-06-12 MED ORDER — POTASSIUM CHLORIDE CRYS ER 20 MEQ PO TBCR: 40.0000 meq | EXTENDED_RELEASE_TABLET | Freq: Two times a day (BID) | ORAL | 3 refills | Status: DC

## 2022-06-12 NOTE — Telephone Encounter (Signed)
Patient's lab order has been placed. Patient has a appointment already scheduled and wants his repeat labs done at that time. Pt's med list is correct. Pt aware, agreeable, and verbalized understanding

## 2022-06-12 NOTE — Telephone Encounter (Signed)
-----   Message from Rafael Bihari, [place] sent at 06/11/2022  4:35 PM EDT ----- K is lower. Increase KCL to 40 bid, repeat BMET in 2 weeks

## 2022-06-13 ENCOUNTER — Other Ambulatory Visit: Payer: Self-pay | Admitting: Nurse Practitioner

## 2022-06-13 DIAGNOSIS — J029 Acute pharyngitis, unspecified: Secondary | ICD-10-CM

## 2022-06-20 NOTE — Progress Notes (Signed)
Advanced Heart Failure Clinic Note   Primary Care: [name], MD Primary Cardiologist: Dr. [name]  EP: Dr Allred Nephrology: Dr. Candiss Norse HF Cardiologist: Dr Haroldine Laws   HPI: Phillip [name] is a 62 y.o. male hx of PAF, remote PE, hx of DVT, OSA, morbid obesity, CKD Stage III,  HTN, and  combined chronic systolic/diastolic. S/P A Flutter ablation May 2019 .     EF in 2015 40-45% Echo 12/21/16 with EF 55-60% G2DD, mild aortic regurgitation.  Last nuc 2008 no ischemia, no hx of cath due to CKD.    Developed AFL and underwent AFL ablation in 5/19.  Seen 7/20 and was feeling bad. Found to be in AFL/AF in 80s. Echo at that time with EF back down to 30%. Underwent DC-CV on 10/14/18 and referred to AF Clinic.   Echo 11/20 EF 45-50%  Developed recurrent AF at the end of January 2021. Underwent attempt at [facility] on 04/22/19 but failed to convert. Seen back in AF Clinic on 2/5. Not able to use Tikosyn due to long QT. Not ablation candidate due to obesity. Considered amiodarone. Started on flecainide but had treadmill to monitor Flecainide Tx.   Patient had significant QRS widening within first minute of exercise.  Flecainide stopped  Echo 5/21 EF 45-50%  Recurrent AF in 9/22. Admitted. Had DC-CV. Amiodarone started.   Echo 9/22 EF 45-50% Marked LAE   Admitted 1/23 with a/c CHF and afib RVR. Echo EF 40-45%, global LV HK, RV ok. Diuresed and underwent DCCV.   Had AF ablation by Dr. Rodena Goldmann at [facility] 07/23. Saw Afib clinic 08/23 and in rate controlled AF. Worse 7 day monitor - rhythm was sinus with 14% AF burden.  Seen in ED 01/12/22 with a/c CHF. States he had been drinking a lot of fluid. Given 80 mg IV lasix. He diuresed well and was felt to be stable for discharge home.   Follow up 11/23, NYHA II and volume stable. Saw EP 1/24, not ablation candidate yet due to weight. Continue amiodarone.  Today he returns for HF follow up. Overall feeling fine, main complaint is chest and nasal congestion x  4 days. Has abdominal swelling and leg swelling.  He has SOB walking up steps. Works driving dump truck. Denies palpitations, abnormal bleeding, CP, dizziness,  or PND/Orthopnea. Appetite ok. No fever or chills. Weight at home 311 pounds. Taking all medications. Takes extra torsemide for a couple days about every other week. Wears CPAP every night.    Cardiac Studies: - Last nuc 2008 no ischemia  - Echo 2015 EF 40-45%  - Echo 12/21/16 with EF 55-60% G2DD, mild aortic regurgitation.    - Echo 7/20 EF 30%  - Echo 11/20 EF 45-50%  - Echo 5/21 EF 45-50%  - Echo 9/22 EF 45-50% Marked LAE   - Echo 1/23 EF 40-45%, global LV HK, RV ok.   Review of systems complete and found to be negative unless listed in HPI.    Past Medical History:  Diagnosis Date   Asthma    CHF (congestive heart failure) ([place]) 11/15   EF 40-45%, improved with follow-up   Chronic kidney disease    COPD (chronic obstructive pulmonary disease) (HCC)    Deep vein thrombosis (DVT) of lower extremity ([place]) 08/17/2015   DVT (deep venous thrombosis) (HCC)    Gout    Hypertension    Hypertensive cardiovascular disease    Kidney dysfunction    Morbid obesity (HCC)    OSA on CPAP  Paroxysmal atrial fibrillation (HCC)    PNA (pneumonia)    Pulmonary embolism ([place]) 2010   left leg DVT   Sleep apnea    uses cpap   Current Outpatient Medications  Medication Sig Dispense Refill   albuterol (PROAIR HFA) 108 (90 Base) MCG/ACT inhaler Inhale 2 puffs into the lungs every 4 (four) hours as needed for wheezing or shortness of breath. 1 Inhaler 3   albuterol (PROVENTIL) (2.5 MG/3ML) 0.083% nebulizer solution INHALE 1 VIAL VIA NEBULIZER EVERY 6 HOURS AS NEEDED FOR WHEEZING/SHORTNESS OF BREATH. 375 mL 11   allopurinol (ZYLOPRIM) 300 MG tablet TAKE 1 TABLET BY MOUTH EVERY DAY 90 tablet 3   amiodarone (PACERONE) 200 MG tablet Take 1 tablet (200 mg total) by mouth daily. 90 tablet 1   apixaban (ELIQUIS) 5 MG TABS tablet Take 1  tablet (5 mg total) by mouth 2 (two) times daily. 180 tablet 3   calcium carbonate (OS-CAL - DOSED IN MG OF ELEMENTAL CALCIUM) 1250 (500 Ca) MG tablet Take 1 tablet by mouth daily.     carvedilol (COREG) 6.25 MG tablet Take 2 tablets (12.5 mg total) by mouth 2 (two) times daily with a meal. 120 tablet 5   Cholecalciferol (VITAMIN D3) 50 MCG (2000 UT) TABS Take 2,000 Units by mouth daily.     hydrALAZINE (APRESOLINE) 100 MG tablet Take 1 tablet (100 mg total) by mouth every 8 (eight) hours. 90 tablet 0   isosorbide mononitrate (IMDUR) 120 MG 24 hr tablet Take 1 tablet (120 mg total) by mouth daily. 90 tablet 3   metolazone (ZAROXOLYN) 2.5 MG tablet Take 1 tablet (2.5 mg total) by mouth as directed. As directed by Heart Failure Clinic 5 tablet 1   oxymetazoline (AFRIN NASAL SPRAY) 0.05 % nasal spray Place 1 spray into both nostrils 2 (two) times daily as needed for congestion.     potassium chloride SA (KLOR-CON M20) 20 MEQ tablet Take 2 tablets (40 mEq total) by mouth 2 (two) times daily. 360 tablet 3   SYMBICORT 80-4.5 MCG/ACT inhaler USE 2 INHALATIONS TWICE A DAY (Patient taking differently: Inhale 2 puffs into the lungs 2 (two) times daily.) 30.6 g 3   torsemide (DEMADEX) 20 MG tablet Take 3 tablets (60 mg total) by mouth 2 (two) times daily. 180 tablet 11   No current facility-administered medications for this visit.   Allergies  Allergen Reactions   Other Anaphylaxis    mushrooms    Social History   Socioeconomic History   Marital status: Divorced    Spouse name: Not on file   Number of children: 2   Years of education: 12   Highest education level: Not on file  Occupational History   Occupation: Drives dump truck    Comment:     Occupation: Airline pilot    Comment: Disabled  Tobacco Use   Smoking status: Former    Packs/day: 0.25    Years: 1.00    Additional pack years: 0.00    Total pack years: 0.25    Types: Cigarettes    Quit date: 07/23/1989    Years since quitting:  32.9   Smokeless tobacco: Never   Tobacco comments:    Former smoker 03/06/2021  Vaping Use   Vaping Use: Never used  Substance and Sexual Activity   Alcohol use: No    Alcohol/week: 0.0 standard drinks of alcohol   Drug use: No   Sexual activity: Not Currently  Other Topics Concern   Not on file  Social History Narrative   Pt lives in [place], alone.   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   Has 2 sons in [place]      No living will   Would want sons to make decisions for him   Would accept resuscitation   Social Determinants of Health   Financial Resource Strain: Low Risk  (01/19/2018)   Overall Financial Resource Strain (CARDIA)    Difficulty of Paying Living Expenses: Not hard at all  Food Insecurity: No Food Insecurity (01/19/2018)   Hunger Vital Sign    Worried About Running Out of Food in the Last Year: Never true    Ran Out of Food in the Last Year: Never true  Transportation Needs: No Transportation Needs (01/19/2018)   PRAPARE - Hydrologist (Medical): No    Lack of Transportation (Non-Medical): No  Physical Activity: Not on file  Stress: Not on file  Social Connections: Not on file  Intimate Partner Violence: Not on file    Family History  Problem Relation Age of Onset   Hypertension Mother    Diabetes Father    CAD Father    Clotting disorder Father    Heart disease Father        transplant   Stomach cancer Father    Allergies Brother    Colon cancer Neg Hx    Colon polyps Neg Hx    Esophageal cancer Neg Hx    Rectal cancer Neg Hx    There were no vitals taken for this visit.  Wt Readings from Last 3 Encounters:  06/03/22 (!) 145.9 kg (321 lb 9.6 oz)  05/02/22 (!) 139.4 kg (307 lb 4 oz)  04/03/22 (!) 136.5 kg (301 lb)   PHYSICAL EXAM: General:  NAD. No resp difficulty, walked into clinic HEENT: Normal Neck: Supple. No JVD, thick neck. Carotids 2+ bilat; no bruits. No  lymphadenopathy or thryomegaly appreciated. Cor: PMI nondisplaced. Regular rate & rhythm. No rubs, gallops or murmurs. Lungs: + rhonchi throughout Abdomen: Soft, obese, nontender, nondistended. No hepatosplenomegaly. No bruits or masses. Good bowel sounds. Extremities: No cyanosis, clubbing, rash, 1+ BLE pre-tibial edema L>R Neuro: Alert & oriented x 3, cranial nerves grossly intact. Moves all 4 extremities w/o difficulty. Affect pleasant.  ReDs: 43%  ASSESSMENT & PLAN:  1. Chronic HFmEF - Echo 11/2016 LVEF 55-60%, Grade 2 DD. - EF 7/20 back down to 30%. Suspect due to AF but rate not overly elevated. HTN may also be playing a role. - Symptoms improved after DC-CV on 10/11/18  - Echo 11/20 EF 45-50% - Echo 5/21 EF 45-50% - Echo 9/22 EF 45-50% - Echo 1/23 EF 40-45%, global LV HK, RV ok. - Worse NYHA II-early III, volume up a little today, REDs 43%. GDMT limited by CKD - Increase torsemide to 60 mg bid x 3 days, then back to 40 mg bid - Continue Coreg 12.5 mg bid. - Continue hydralazine 100 mg tid + Imdur 120 mg daily. - Off Farxiga due to AKI and refuses to restart due to cost. - Has not had cath due to CKD and lack of clear anginal symptoms.  - No ACE/ARB with CKD (creatinine has run 2.5-3.3) - Labs today - Rhonchi on exam with congestion, will obtain CXR. - Given Rx for compression hose  2. PAF / Typical A-flutter, paroxysmal - S/P AFL ablation 07/2017. - s/p DC-CV for AFib on 10/14/18 and 05/10/19 and 9/22 -  Amio started 9/22 - S/p AF ablation 07/23 by Dr. Lenard Galloway - He reports his Nephrologist suggested considering Eliquis instead of Xarelto. Will switch to Eliquis 5 mg BID. - He is compliant with CPAP. - Regular on exam today. - Continue amiodarone 200 mg daily. - Given young age would like to avoid long-term exposure to [facility].  Check amiodarone labs today - Saw EP, not an ablation candidate until weight closer to 250 lbs.  3. Hypertension - BP ok today. - Meds as  above.  4. Morbid obesity - There is no height or weight on file to calculate BMI.  - He has lost over 100 pounds. Congratulated him. Recently gained some weight back, but is getting diet on track. - Has not been interested in [facility].  5. CKD IV - Baseline SCr ~2.5 -3.0  - Follows with Dr. Candiss Norse.  - Labs today  6. OSA on CPAP - Compliant with CPAP.  - No change  7. Iron deficiency anemia - Getting Venofer  - Denies recent bleeding issues - Followed by Heme/Onc  Follow up in 4 months with Dr. Haroldine Laws  Rafael Bihari, FNP  4:24 PM

## 2022-06-24 ENCOUNTER — Telehealth (HOSPITAL_COMMUNITY): Payer: Self-pay | Admitting: Cardiology

## 2022-06-24 ENCOUNTER — Other Ambulatory Visit (HOSPITAL_COMMUNITY): Payer: Self-pay | Admitting: Family Medicine

## 2022-06-24 ENCOUNTER — Encounter (HOSPITAL_COMMUNITY): Payer: Self-pay

## 2022-06-24 ENCOUNTER — Ambulatory Visit (HOSPITAL_COMMUNITY)
Admission: RE | Admit: 2022-06-24 | Discharge: 2022-06-24 | Disposition: A | Discharge status: Home / Self Care | Payer: Managed Care, Other (non HMO) | Source: Ambulatory Visit | Attending: Family Medicine | Admitting: Family Medicine

## 2022-06-24 VITALS — BP 138/60 | HR 63 | Wt 304.8 lb

## 2022-06-24 DIAGNOSIS — Z79899 Other long term (current) drug therapy: Secondary | ICD-10-CM | POA: Insufficient documentation

## 2022-06-24 DIAGNOSIS — Z86718 Personal history of other venous thrombosis and embolism: Secondary | ICD-10-CM | POA: Insufficient documentation

## 2022-06-24 DIAGNOSIS — I480 Paroxysmal atrial fibrillation: Secondary | ICD-10-CM | POA: Diagnosis not present

## 2022-06-24 DIAGNOSIS — G4733 Obstructive sleep apnea (adult) (pediatric): Secondary | ICD-10-CM

## 2022-06-24 DIAGNOSIS — I130 Hypertensive heart and chronic kidney disease with heart failure and stage 1 through stage 4 chronic kidney disease, or unspecified chronic kidney disease: Secondary | ICD-10-CM | POA: Insufficient documentation

## 2022-06-24 DIAGNOSIS — D509 Iron deficiency anemia, unspecified: Secondary | ICD-10-CM | POA: Diagnosis not present

## 2022-06-24 DIAGNOSIS — I5022 Chronic systolic (congestive) heart failure: Secondary | ICD-10-CM | POA: Diagnosis present

## 2022-06-24 DIAGNOSIS — N184 Chronic kidney disease, stage 4 (severe): Secondary | ICD-10-CM | POA: Diagnosis not present

## 2022-06-24 DIAGNOSIS — Z7901 Long term (current) use of anticoagulants: Secondary | ICD-10-CM | POA: Insufficient documentation

## 2022-06-24 DIAGNOSIS — I10 Essential (primary) hypertension: Secondary | ICD-10-CM | POA: Diagnosis not present

## 2022-06-24 DIAGNOSIS — Z6841 Body Mass Index (BMI) 40.0 and over, adult: Secondary | ICD-10-CM | POA: Insufficient documentation

## 2022-06-24 DIAGNOSIS — I4819 Other persistent atrial fibrillation: Secondary | ICD-10-CM | POA: Diagnosis not present

## 2022-06-24 LAB — BASIC METABOLIC PANEL
Anion gap: 11 (ref 5–15)
BUN: 40 mg/dL — ABNORMAL HIGH (ref 6–20)
CO2: 29 mmol/L (ref 22–32)
Calcium: 9.3 mg/dL (ref 8.9–10.3)
Chloride: 102 mmol/L (ref 98–111)
Creatinine, Ser: 3.1 mg/dL — ABNORMAL HIGH (ref 0.61–1.24)
GFR, Estimated: 22 mL/min — ABNORMAL LOW (ref >60)
Glucose, Bld: 94 mg/dL (ref 70–99)
Potassium: 4.1 mmol/L (ref 3.5–5.1)
Sodium: 142 mmol/L (ref 135–145)

## 2022-06-24 LAB — BRAIN NATRIURETIC PEPTIDE: B Natriuretic Peptide: 1429.1 pg/mL — ABNORMAL HIGH (ref 0.0–100.0)

## 2022-06-24 MED ORDER — TORSEMIDE 20 MG PO TABS: 60.0000 mg | ORAL_TABLET | Freq: Two times a day (BID) | ORAL | 11 refills | Status: DC

## 2022-06-24 NOTE — Telephone Encounter (Signed)
Clarification was needed regarding cancelling of all other scripts on file Pt already had an active 60 bid script on file Will cancel previous script from [facility] with 60 bid and keep the most recent order for JM 60 bid active so a corresponding OV is with script  Above verbally discussed with pharmacy

## 2022-06-24 NOTE — Patient Instructions (Signed)
It was great to see you today! No medication changes are needed at this time.  Your physician wants you to follow-up in: 2-3 months with Dr Haroldine Laws (June 2024) You will receive a reminder letter in the mail two months in advance. If you don't receive a letter, please call our office to schedule the follow-up appointment.   Do the following things EVERYDAY: Weigh yourself in the morning before breakfast. Write it down and keep it in a log. Take your medicines as prescribed Eat low salt foods--Limit salt (sodium) to 2000 mg per day.  Stay as active as you can everyday Limit all fluids for the day to less than 2 liters  At the [facility], you and your health needs are our priority. As part of our continuing mission to provide you with exceptional heart care, we have created designated Provider Care Teams. These Care Teams include your primary Cardiologist (physician) and Advanced Practice Providers (APPs- Physician Assistants and Nurse Practitioners) who all work together to provide you with the care you need, when you need it.   You may see any of the following providers on your designated Care Team at your next follow up: Dr Glori Bickers Dr Loralie Champagne Dr. Roxana Hires, NP Lyda Jester, [place] [facility] [place], [place] Forestine Na, NP Audry Riles, PharmD   Please be sure to bring in all your medications bottles to every appointment.    Thank you for choosing [facility]   If you have any questions or concerns before your next appointment please send [place] a message through [place] or call our office at [phone].    TO LEAVE A MESSAGE FOR THE NURSE SELECT OPTION 2, PLEASE LEAVE A MESSAGE INCLUDING: YOUR NAME DATE OF BIRTH CALL BACK NUMBER REASON FOR CALL**this is important as we prioritize the call backs  YOU WILL RECEIVE A CALL BACK THE SAME DAY AS LONG AS YOU CALL BEFORE 4:00 PM

## 2022-06-24 NOTE — Telephone Encounter (Signed)
Patient called.  Patient aware.  

## 2022-06-24 NOTE — Telephone Encounter (Signed)
-----   Message from Rafael Bihari, [place] sent at 06/24/2022 11:46 AM EDT ----- Labs stable, BNP improving.   Please change torsemide to 60 mg bid (has been taking 40 bid)

## 2022-06-26 ENCOUNTER — Other Ambulatory Visit (HOSPITAL_COMMUNITY): Payer: Managed Care, Other (non HMO)

## 2022-08-04 MED FILL — Iron Sucrose Inj 20 MG/ML (Fe Equiv): INTRAVENOUS | Qty: 10 | Status: AC

## 2022-08-05 ENCOUNTER — Inpatient Hospital Stay (HOSPITAL_BASED_OUTPATIENT_CLINIC_OR_DEPARTMENT_OTHER): Payer: Managed Care, Other (non HMO) | Admitting: Oncology

## 2022-08-05 ENCOUNTER — Encounter: Payer: Self-pay | Admitting: Oncology

## 2022-08-05 ENCOUNTER — Inpatient Hospital Stay: Payer: Managed Care, Other (non HMO)

## 2022-08-05 ENCOUNTER — Inpatient Hospital Stay: Payer: Managed Care, Other (non HMO) | Attending: Oncology

## 2022-08-05 VITALS — BP 155/61 | HR 55 | Temp 97.3°F | Resp 18 | Wt 309.1 lb

## 2022-08-05 DIAGNOSIS — Z800 Family history of malignant neoplasm of digestive organs: Secondary | ICD-10-CM | POA: Diagnosis not present

## 2022-08-05 DIAGNOSIS — Z86718 Personal history of other venous thrombosis and embolism: Secondary | ICD-10-CM | POA: Diagnosis not present

## 2022-08-05 DIAGNOSIS — G4733 Obstructive sleep apnea (adult) (pediatric): Secondary | ICD-10-CM | POA: Diagnosis not present

## 2022-08-05 DIAGNOSIS — Z87891 Personal history of nicotine dependence: Secondary | ICD-10-CM | POA: Diagnosis not present

## 2022-08-05 DIAGNOSIS — Z8249 Family history of ischemic heart disease and other diseases of the circulatory system: Secondary | ICD-10-CM | POA: Insufficient documentation

## 2022-08-05 DIAGNOSIS — Z832 Family history of diseases of the blood and blood-forming organs and certain disorders involving the immune mechanism: Secondary | ICD-10-CM | POA: Diagnosis not present

## 2022-08-05 DIAGNOSIS — I130 Hypertensive heart and chronic kidney disease with heart failure and stage 1 through stage 4 chronic kidney disease, or unspecified chronic kidney disease: Secondary | ICD-10-CM | POA: Insufficient documentation

## 2022-08-05 DIAGNOSIS — E538 Deficiency of other specified B group vitamins: Secondary | ICD-10-CM | POA: Diagnosis not present

## 2022-08-05 DIAGNOSIS — Z86711 Personal history of pulmonary embolism: Secondary | ICD-10-CM | POA: Insufficient documentation

## 2022-08-05 DIAGNOSIS — D631 Anemia in chronic kidney disease: Secondary | ICD-10-CM | POA: Diagnosis not present

## 2022-08-05 DIAGNOSIS — N184 Chronic kidney disease, stage 4 (severe): Secondary | ICD-10-CM | POA: Diagnosis not present

## 2022-08-05 DIAGNOSIS — Z833 Family history of diabetes mellitus: Secondary | ICD-10-CM | POA: Diagnosis not present

## 2022-08-05 DIAGNOSIS — Z7901 Long term (current) use of anticoagulants: Secondary | ICD-10-CM | POA: Diagnosis not present

## 2022-08-05 DIAGNOSIS — I5042 Chronic combined systolic (congestive) and diastolic (congestive) heart failure: Secondary | ICD-10-CM | POA: Diagnosis not present

## 2022-08-05 DIAGNOSIS — D696 Thrombocytopenia, unspecified: Secondary | ICD-10-CM

## 2022-08-05 DIAGNOSIS — Z79899 Other long term (current) drug therapy: Secondary | ICD-10-CM | POA: Insufficient documentation

## 2022-08-05 DIAGNOSIS — I480 Paroxysmal atrial fibrillation: Secondary | ICD-10-CM | POA: Insufficient documentation

## 2022-08-05 LAB — IRON AND TIBC
Iron: 73 ug/dL (ref 45–182)
Saturation Ratios: 25 % (ref 17.9–39.5)
TIBC: 294 ug/dL (ref 250–450)
UIBC: 221 ug/dL

## 2022-08-05 LAB — COMPREHENSIVE METABOLIC PANEL
ALT: 28 U/L (ref 0–44)
AST: 38 U/L (ref 15–41)
Albumin: 3.6 g/dL (ref 3.5–5.0)
Alkaline Phosphatase: 94 U/L (ref 38–126)
Anion gap: 9 (ref 5–15)
BUN: 42 mg/dL — ABNORMAL HIGH (ref 6–20)
CO2: 30 mmol/L (ref 22–32)
Calcium: 8.8 mg/dL — ABNORMAL LOW (ref 8.9–10.3)
Chloride: 103 mmol/L (ref 98–111)
Creatinine, Ser: 2.95 mg/dL — ABNORMAL HIGH (ref 0.61–1.24)
GFR, Estimated: 24 mL/min — ABNORMAL LOW (ref >60)
Glucose, Bld: 85 mg/dL (ref 70–99)
Potassium: 4.1 mmol/L (ref 3.5–5.1)
Sodium: 142 mmol/L (ref 135–145)
Total Bilirubin: 1 mg/dL (ref 0.3–1.2)
Total Protein: 6.4 g/dL — ABNORMAL LOW (ref 6.5–8.1)

## 2022-08-05 LAB — CBC WITH DIFFERENTIAL/PLATELET
Abs Immature Granulocytes: 0.01 10*3/uL (ref 0.00–0.07)
Basophils Absolute: 0 10*3/uL (ref 0.0–0.1)
Basophils Relative: 1 %
Eosinophils Absolute: 0 10*3/uL (ref 0.0–0.5)
Eosinophils Relative: 1 %
HCT: 36.8 % — ABNORMAL LOW (ref 39.0–52.0)
Hemoglobin: 11.3 g/dL — ABNORMAL LOW (ref 13.0–17.0)
Immature Granulocytes: 0 %
Lymphocytes Relative: 19 %
Lymphs Abs: 0.5 10*3/uL — ABNORMAL LOW (ref 0.7–4.0)
MCH: 29.1 pg (ref 26.0–34.0)
MCHC: 30.7 g/dL (ref 30.0–36.0)
MCV: 94.8 fL (ref 80.0–100.0)
Monocytes Absolute: 0.3 10*3/uL (ref 0.1–1.0)
Monocytes Relative: 11 %
Neutro Abs: 1.9 10*3/uL (ref 1.7–7.7)
Neutrophils Relative %: 68 %
Platelets: 118 10*3/uL — ABNORMAL LOW (ref 150–400)
RBC: 3.88 MIL/uL — ABNORMAL LOW (ref 4.22–5.81)
RDW: 15.7 % — ABNORMAL HIGH (ref 11.5–15.5)
WBC: 2.9 10*3/uL — ABNORMAL LOW (ref 4.0–10.5)
nRBC: 0 % (ref 0.0–0.2)

## 2022-08-05 LAB — FERRITIN: Ferritin: 103 ng/mL (ref 24–336)

## 2022-08-05 NOTE — Progress Notes (Signed)
Hematology/Oncology Progress note [phone] [phone]) [phone]  CHIEF COMPLAINTS/REASON FOR VISIT:  anemia of chronic kidney disease, B12 deficiency.  ASSESSMENT & PLAN:   Anemia of chronic renal failure Anemia, likely secondary to chronic kidney disease Labs reviewed and discussed with patient. Lab Results  Component Value Date   HGB 11.3 (L) 08/05/2022   TIBC 294 08/05/2022   IRONPCTSAT 25 08/05/2022   FERRITIN 103 08/05/2022   Recommend additional Venofer 200mg  x 1    Chronic kidney disease, stage IV (severe) (HCC) Encourage oral hydration and avoid nephrotoxins.    B12 deficiency B12 level is pending.   Orders Placed This Encounter  Procedures   CBC with Differential (Cancer Center Only)    Standing Status:   Future    Standing Expiration Date:   08/05/2023   Iron and TIBC    Standing Status:   Future    Standing Expiration Date:   08/05/2023   Ferritin    Standing Status:   Future    Standing Expiration Date:   08/05/2023   Vitamin B12    Standing Status:   Future    Standing Expiration Date:   08/05/2023   Follow-up 6 months. All questions were answered. The patient knows to call the clinic with any problems, questions or concerns.  [name], MD, PhD [facility] Hematology Oncology 08/05/2022   HISTORY OF PRESENTING ILLNESS:   Phillip [name] is a  62 y.o.  male with PMH listed below was seen in consultation at the request of  Tillman Abide I, MD  for evaluation of anemia of chronic kidney disease.  Patient has chronic kidney disease and follows up with Dr. Thedore Mins.  Baseline creatinine 2.9. History of pulmonary embolism [2010], left lower extremity DVT[2017], atrial fibrillation, currently on Xarelto 20 mg daily.  Managed by cardiology/CHF clinic Dr.Bensimhon.  Patient denies any melena, hematochezia.  07/16/2021 CBC showed hemoglobin 10.4, MCV 91, platelet count 10 9000, white count 2.6. Patient denies alcohol use.  He has intentionally lost  weight. Patient was referred to establish care with hematology for further evaluation.  INTERVAL HISTORY Phillip [name] is a 62 y.o. male who has above history reviewed by me today presents for follow up visit for management of anemia due to chronic kidney disease, vitamin B12 deficiency. Patient reports feeling well. He is on Eliquis 5mg  BID for A-fib.  Follows up with [facility] cardiology. Chronic kidney disease.  Follows up with nephrology. Fatigue level has improved.  No other new complaints.  He denies bleeding events.  Review of Systems  Constitutional:  Negative for appetite change, chills, fatigue, fever and unexpected weight change.  HENT:   Negative for hearing loss and voice change.   Eyes:  Negative for eye problems and icterus.  Respiratory:  Negative for chest tightness, cough and shortness of breath.   Cardiovascular:  Negative for chest pain and leg swelling.  Gastrointestinal:  Negative for abdominal distention and abdominal pain.  Endocrine: Negative for hot flashes.  Genitourinary:  Negative for difficulty urinating, dysuria and frequency.   Musculoskeletal:  Negative for arthralgias.  Skin:  Negative for itching and rash.  Neurological:  Negative for light-headedness and numbness.  Hematological:  Negative for adenopathy. Does not bruise/bleed easily.  Psychiatric/Behavioral:  Negative for confusion.     MEDICAL HISTORY:  Past Medical History:  Diagnosis Date   Asthma    CHF (congestive heart failure) (HCC) 11/15   EF 40-45%, improved with follow-up   Chronic kidney disease    COPD (  chronic obstructive pulmonary disease) (HCC)    Deep vein thrombosis (DVT) of lower extremity (HCC) 08/17/2015   DVT (deep venous thrombosis) (HCC)    Gout    Hypertension    Hypertensive cardiovascular disease    Kidney dysfunction    Morbid obesity (HCC)    OSA on CPAP    Paroxysmal atrial fibrillation (HCC)    PNA (pneumonia)    Pulmonary embolism (HCC) 2010   left leg DVT   Sleep  apnea    uses cpap    SURGICAL HISTORY: Past Surgical History:  Procedure Laterality Date   A-FLUTTER ABLATION N/A 07/22/2017   Procedure: A-FLUTTER ABLATION;  Surgeon: Hillis Range, MD;  [facility]: [facility] INVASIVE CV LAB;  Service: Cardiovascular;  Laterality: N/A;   CARDIOVERSION N/A 10/14/2018   Procedure: CARDIOVERSION;  Surgeon: Dolores Patty, MD;  Location: [facility] ENDOSCOPY;  Service: Cardiovascular;  Laterality: N/A;   CARDIOVERSION N/A 04/22/2019   Procedure: CARDIOVERSION;  Surgeon: Vesta Mixer, MD;  Location: [facility] ENDOSCOPY;  Service: Cardiovascular;  Laterality: N/A;   CARDIOVERSION N/A 05/10/2019   Procedure: CARDIOVERSION;  Surgeon: Dolores Patty, MD;  Location: [facility] ENDOSCOPY;  Service: Cardiovascular;  Laterality: N/A;   CARDIOVERSION N/A 11/23/2020   Procedure: CARDIOVERSION;  Surgeon: Chrystie Nose, MD;  Location: [facility] ENDOSCOPY;  Service: Cardiovascular;  Laterality: N/A;   CARDIOVERSION N/A 04/16/2021   Procedure: CARDIOVERSION;  Surgeon: Thomasene Ripple, DO;  Location: [facility] ENDOSCOPY;  Service: Cardiovascular;  Laterality: N/A;   COLONOSCOPY WITH PROPOFOL N/A 12/20/2019   Procedure: COLONOSCOPY WITH PROPOFOL;  Surgeon: Benancio Deeds, MD;  Location: [facility] ENDOSCOPY;  Service: Gastroenterology;  Laterality: N/A;   POLYPECTOMY  12/20/2019   Procedure: POLYPECTOMY;  Surgeon: Benancio Deeds, MD;  Location: [facility] ENDOSCOPY;  Service: Gastroenterology;;   TEE WITHOUT CARDIOVERSION N/A 03/05/2022   Procedure: TRANSESOPHAGEAL ECHOCARDIOGRAM (TEE);  Surgeon: Maisie Fus, MD;  Location: [facility] ENDOSCOPY;  Service: Cardiovascular;  Laterality: N/A;   UMBILICAL HERNIA REPAIR  1992    SOCIAL HISTORY: Social History   Socioeconomic History   Marital status: Divorced    Spouse name: Not on file   Number of children: 2   Years of education: 12   Highest education level: Not on file  Occupational History   Occupation: Drives dump truck    Comment:     Occupation: IT sales professional     Comment: Disabled  Tobacco Use   Smoking status: Former    Packs/day: 0.25    Years: 1.00    Additional pack years: 0.00    Total pack years: 0.25    Types: Cigarettes    Quit date: 07/23/1989    Years since quitting: 33.0   Smokeless tobacco: Never   Tobacco comments:    Former smoker 03/06/2021  Vaping Use   Vaping Use: Never used  Substance and Sexual Activity   Alcohol use: No    Alcohol/week: 0.0 standard drinks of alcohol   Drug use: No   Sexual activity: Not Currently  Other Topics Concern   Not on file  Social History Narrative   Pt lives in [place], alone.   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   Has 2 sons in [place]      No living will   Would want sons to make decisions for him   Would accept resuscitation   Social Determinants of Health   Financial Resource Strain: Low Risk  (01/19/2018)   Overall Financial Resource Strain (CARDIA)  Difficulty of Paying Living Expenses: Not hard at all  Food Insecurity: No Food Insecurity (01/19/2018)   Hunger Vital Sign    Worried About Running Out of Food in the Last Year: Never true    Ran Out of Food in the Last Year: Never true  Transportation Needs: No Transportation Needs (01/19/2018)   PRAPARE - Administrator, Civil Service (Medical): No    Lack of Transportation (Non-Medical): No  Physical Activity: Not on file  Stress: Not on file  Social Connections: Not on file  Intimate Partner Violence: Not on file    FAMILY HISTORY: Family History  Problem Relation Age of Onset   Hypertension Mother    Diabetes Father    CAD Father    Clotting disorder Father    Heart disease Father        transplant   Stomach cancer Father    Allergies Brother    Colon cancer Neg Hx    Colon polyps Neg Hx    Esophageal cancer Neg Hx    Rectal cancer Neg Hx     ALLERGIES:  is allergic to other.  MEDICATIONS:  Current Outpatient Medications  Medication Sig Dispense Refill    albuterol (PROAIR HFA) 108 (90 Base) MCG/ACT inhaler Inhale 2 puffs into the lungs every 4 (four) hours as needed for wheezing or shortness of breath. 1 Inhaler 3   albuterol (PROVENTIL) (2.5 MG/3ML) 0.083% nebulizer solution INHALE 1 VIAL VIA NEBULIZER EVERY 6 HOURS AS NEEDED FOR WHEEZING/SHORTNESS OF BREATH. 375 mL 11   allopurinol (ZYLOPRIM) 300 MG tablet TAKE 1 TABLET BY MOUTH EVERY DAY 90 tablet 3   amiodarone (PACERONE) 200 MG tablet Take 1 tablet (200 mg total) by mouth daily. 90 tablet 1   apixaban (ELIQUIS) 5 MG TABS tablet Take 1 tablet (5 mg total) by mouth 2 (two) times daily. 180 tablet 3   calcium carbonate (OS-CAL - DOSED IN MG OF ELEMENTAL CALCIUM) 1250 (500 Ca) MG tablet Patient takes 1 tablet by mouth every 2 days.     carvedilol (COREG) 6.25 MG tablet Take 2 tablets (12.5 mg total) by mouth 2 (two) times daily with a meal. 120 tablet 5   Cholecalciferol (VITAMIN D3) 50 MCG (2000 UT) TABS Take 2,000 Units by mouth daily.     hydrALAZINE (APRESOLINE) 100 MG tablet Take 1 tablet (100 mg total) by mouth every 8 (eight) hours. 90 tablet 0   isosorbide mononitrate (IMDUR) 120 MG 24 hr tablet Take 1 tablet (120 mg total) by mouth daily. 90 tablet 3   metolazone (ZAROXOLYN) 2.5 MG tablet Take 1 tablet (2.5 mg total) by mouth as directed. As directed by Heart Failure Clinic 5 tablet 1   oxymetazoline (AFRIN NASAL SPRAY) 0.05 % nasal spray Place 1 spray into both nostrils 2 (two) times daily as needed for congestion.     potassium chloride SA (KLOR-CON M20) 20 MEQ tablet Take 2 tablets (40 mEq total) by mouth 2 (two) times daily. 360 tablet 3   SYMBICORT 80-4.5 MCG/ACT inhaler USE 2 INHALATIONS TWICE A DAY (Patient taking differently: Inhale 2 puffs into the lungs 2 (two) times daily.) 30.6 g 3   torsemide (DEMADEX) 20 MG tablet TAKE 3 TABLETS (60 MG TOTAL) BY MOUTH 2 (TWO) TIMES DAILY. 180 tablet 11   No current facility-administered medications for this visit.     PHYSICAL  EXAMINATION: ECOG PERFORMANCE STATUS: 1 - Symptomatic but completely ambulatory Vitals:   08/05/22 1404  BP: (!) 155/61  Pulse: (!) 55  Resp: 18  Temp: (!) 97.3 F (36.3 C)   Filed Weights   08/05/22 1404  Weight: (!) 309 lb 1.6 oz (140.2 kg)    Physical Exam Constitutional:      General: He is not in acute distress.    Appearance: He is obese.  HENT:     Head: Normocephalic and atraumatic.  Eyes:     General: No scleral icterus. Cardiovascular:     Rate and Rhythm: Normal rate and regular rhythm.     Heart sounds: Normal heart sounds.  Pulmonary:     Effort: Pulmonary effort is normal. No respiratory distress.     Breath sounds: No wheezing.  Abdominal:     General: Bowel sounds are normal. There is no distension.     Palpations: Abdomen is soft.  Musculoskeletal:        General: No deformity. Normal range of motion.     Cervical back: Normal range of motion and neck supple.  Skin:    General: Skin is warm and dry.     Findings: No erythema or rash.  Neurological:     Mental Status: He is alert and oriented to person, place, and time. Mental status is at baseline.     Cranial Nerves: No cranial nerve deficit.  Psychiatric:        Mood and Affect: Mood normal.     LABORATORY DATA:  I have reviewed the data as listed    Latest Ref Rng & Units 08/05/2022   12:39 PM 06/03/2022   11:42 AM 04/03/2022    2:01 PM  CBC  WBC 4.0 - 10.5 K/uL 2.9  2.7  3.2   Hemoglobin 13.0 - 17.0 g/dL 45.4  09.8  11.9   Hematocrit 39.0 - 52.0 % 36.8  32.2  33.1   Platelets 150 - 400 K/uL 118  100  136     Recent Labs    02/03/22 1118 03/05/22 1138 06/11/22 1051 06/24/22 0931 08/05/22 1239  NA 146*   < > 140 142 142  K 4.1   < > 3.3* 4.1 4.1  CL 108   < > 98 102 103  CO2 31   < > 34* 29 30  GLUCOSE 83   < > 87 94 85  BUN 38*   < > 46* 40* 42*  CREATININE 3.14*   < > 3.37* 3.10* 2.95*  CALCIUM 8.8*   < > 9.1 9.3 8.8*  GFRNONAA 22*   < > 20* 22* 24*  PROT 5.6*  --   --   --   6.4*  ALBUMIN 3.3*  --   --   --  3.6  AST 34  --   --   --  38  ALT 31  --   --   --  28  ALKPHOS 66  --   --   --  94  BILITOT 0.4  --   --   --  1.0   < > = values in this interval not displayed.    Iron/TIBC/Ferritin/ %Sat    Component Value Date/Time   IRON 73 08/05/2022 1239   IRON 74 03/23/2017 1133   TIBC 294 08/05/2022 1239   TIBC 334 03/23/2017 1133   FERRITIN 103 08/05/2022 1239   IRONPCTSAT 25 08/05/2022 1239   IRONPCTSAT 22 03/23/2017 1133      RADIOGRAPHIC STUDIES: I have personally reviewed the radiological images as listed and agreed with the findings in the  report. No results found.

## 2022-08-05 NOTE — Assessment & Plan Note (Signed)
Encourage oral hydration and avoid nephrotoxins.   

## 2022-08-05 NOTE — Assessment & Plan Note (Addendum)
Anemia, likely secondary to chronic kidney disease Labs reviewed and discussed with patient. Lab Results  Component Value Date   HGB 11.3 (L) 08/05/2022   TIBC 294 08/05/2022   IRONPCTSAT 25 08/05/2022   FERRITIN 103 08/05/2022   Recommend additional Venofer 200mg  x 1

## 2022-08-05 NOTE — Assessment & Plan Note (Signed)
B12 level is pending.

## 2022-08-05 NOTE — Progress Notes (Signed)
Pt here for follow up. Reports that he has mild shortness of breath,

## 2022-08-06 ENCOUNTER — Telehealth (HOSPITAL_COMMUNITY): Payer: Self-pay

## 2022-08-06 LAB — VITAMIN B12: Vitamin B-12: 452 pg/mL (ref 180–914)

## 2022-08-06 NOTE — Telephone Encounter (Signed)
Spoke to patient and updated on information.

## 2022-08-06 NOTE — Telephone Encounter (Signed)
Patient called feeling like he has some fluid buildup..shortness of breath, tightness and swelling in abdomen. He stated Shanda Bumps told him he can use Metolazone, but wanted him to call before taking.  He asked if he can take to help. Please advise.

## 2022-08-06 NOTE — Telephone Encounter (Signed)
Can take 1 dose of metolazone within 30 minutes of torsemide but needs to call if this does not alleviate symptoms.

## 2022-08-12 ENCOUNTER — Ambulatory Visit (INDEPENDENT_AMBULATORY_CARE_PROVIDER_SITE_OTHER): Payer: Managed Care, Other (non HMO) | Admitting: Internal Medicine

## 2022-08-12 ENCOUNTER — Encounter: Payer: Self-pay | Admitting: Internal Medicine

## 2022-08-12 VITALS — BP 138/70 | HR 61 | Temp 97.6°F | Ht 67.0 in | Wt 295.0 lb

## 2022-08-12 DIAGNOSIS — N184 Chronic kidney disease, stage 4 (severe): Secondary | ICD-10-CM

## 2022-08-12 DIAGNOSIS — I5042 Chronic combined systolic (congestive) and diastolic (congestive) heart failure: Secondary | ICD-10-CM

## 2022-08-12 DIAGNOSIS — Z0000 Encounter for general adult medical examination without abnormal findings: Secondary | ICD-10-CM

## 2022-08-12 DIAGNOSIS — I351 Nonrheumatic aortic (valve) insufficiency: Secondary | ICD-10-CM | POA: Diagnosis not present

## 2022-08-12 DIAGNOSIS — Z125 Encounter for screening for malignant neoplasm of prostate: Secondary | ICD-10-CM | POA: Diagnosis not present

## 2022-08-12 DIAGNOSIS — G4733 Obstructive sleep apnea (adult) (pediatric): Secondary | ICD-10-CM

## 2022-08-12 DIAGNOSIS — I480 Paroxysmal atrial fibrillation: Secondary | ICD-10-CM

## 2022-08-12 DIAGNOSIS — J4489 Other specified chronic obstructive pulmonary disease: Secondary | ICD-10-CM

## 2022-08-12 LAB — PSA: PSA: 0.38 ng/mL (ref 0.10–4.00)

## 2022-08-12 NOTE — Assessment & Plan Note (Signed)
Monitored by Dr Thedore Mins

## 2022-08-12 NOTE — Assessment & Plan Note (Signed)
Needs to be monitored by cardiology Recent echo

## 2022-08-12 NOTE — Assessment & Plan Note (Signed)
Doing well with the symbicort

## 2022-08-12 NOTE — Assessment & Plan Note (Signed)
Regular now after ablation Is on the eliquis 5 bid

## 2022-08-12 NOTE — Progress Notes (Signed)
Subjective:    Patient ID: Phillip [name], male    DOB: [birth year], 62 y.o.   MRN: [id number]  HPI Here for physical  Doing fairly well Heart has been quiet No chest pain Did have atrial fib ablation---back in sinus Some edema---after driving. Resolves with elevation Breathing is okay--occ SOB (relates to fluid). Takes torsemide 60 daily---and occ extra one Uses metolazone if weight up more (just prn)  Asthma quiet No cough or wheezing--other than rare coughing spell (out of nowhere) No clear heartburn No dysphagia  Seeing Dr Waunita Schooner iron infusions for anemia of CKD Energy levels okay  Current Outpatient Medications on File Prior to Visit  Medication Sig Dispense Refill   albuterol (PROAIR HFA) 108 (90 Base) MCG/ACT inhaler Inhale 2 puffs into the lungs every 4 (four) hours as needed for wheezing or shortness of breath. 1 Inhaler 3   albuterol (PROVENTIL) (2.5 MG/3ML) 0.083% nebulizer solution INHALE 1 VIAL VIA NEBULIZER EVERY 6 HOURS AS NEEDED FOR WHEEZING/SHORTNESS OF BREATH. 375 mL 11   allopurinol (ZYLOPRIM) 300 MG tablet TAKE 1 TABLET BY MOUTH EVERY DAY 90 tablet 3   amiodarone (PACERONE) 200 MG tablet Take 1 tablet (200 mg total) by mouth daily. 90 tablet 1   apixaban (ELIQUIS) 5 MG TABS tablet Take 1 tablet (5 mg total) by mouth 2 (two) times daily. 180 tablet 3   calcium carbonate (OS-CAL - DOSED IN MG OF ELEMENTAL CALCIUM) 1250 (500 Ca) MG tablet Patient takes 1 tablet by mouth every 2 days.     carvedilol (COREG) 6.25 MG tablet Take 2 tablets (12.5 mg total) by mouth 2 (two) times daily with a meal. 120 tablet 5   Cholecalciferol (VITAMIN D3) 50 MCG (2000 UT) TABS Take 2,000 Units by mouth daily.     hydrALAZINE (APRESOLINE) 100 MG tablet Take 1 tablet (100 mg total) by mouth every 8 (eight) hours. 90 tablet 0   isosorbide mononitrate (IMDUR) 120 MG 24 hr tablet Take 1 tablet (120 mg total) by mouth daily. 90 tablet 3   metolazone (ZAROXOLYN) 2.5 MG tablet Take 1 tablet  (2.5 mg total) by mouth as directed. As directed by [facility] Failure Clinic 5 tablet 1   oxymetazoline (AFRIN NASAL SPRAY) 0.05 % nasal spray Place 1 spray into both nostrils 2 (two) times daily as needed for congestion.     potassium chloride SA (KLOR-CON M20) 20 MEQ tablet Take 2 tablets (40 mEq total) by mouth 2 (two) times daily. 360 tablet 3   SYMBICORT 80-4.5 MCG/ACT inhaler USE 2 INHALATIONS TWICE A DAY (Patient taking differently: Inhale 2 puffs into the lungs 2 (two) times daily.) 30.6 g 3   torsemide (DEMADEX) 20 MG tablet TAKE 3 TABLETS (60 MG TOTAL) BY MOUTH 2 (TWO) TIMES DAILY. 180 tablet 11   No current facility-administered medications on file prior to visit.    Allergies  Allergen Reactions   Other Anaphylaxis    mushrooms    Past Medical History:  Diagnosis Date   Asthma    CHF (congestive heart failure) (HCC) 11/15   EF 40-45%, improved with follow-up   Chronic kidney disease    COPD (chronic obstructive pulmonary disease) (HCC)    Deep vein thrombosis (DVT) of lower extremity (HCC) 08/17/2015   DVT (deep venous thrombosis) (HCC)    Gout    Hypertension    Hypertensive cardiovascular disease    Kidney dysfunction    Morbid obesity (HCC)    OSA on CPAP    Paroxysmal atrial  fibrillation (HCC)    PNA (pneumonia)    Pulmonary embolism (HCC) 2010   left leg DVT   Sleep apnea    uses cpap    Past Surgical History:  Procedure Laterality Date   A-FLUTTER ABLATION N/A 07/22/2017   Procedure: A-FLUTTER ABLATION;  Surgeon: Hillis Range, MD;  Location: [facility] INVASIVE CV LAB;  Service: Cardiovascular;  Laterality: N/A;   CARDIOVERSION N/A 10/14/2018   Procedure: CARDIOVERSION;  Surgeon: Dolores Patty, MD;  Location: [facility] ENDOSCOPY;  Service: Cardiovascular;  Laterality: N/A;   CARDIOVERSION N/A 04/22/2019   Procedure: CARDIOVERSION;  Surgeon: Vesta Mixer, MD;  Location: [facility] ENDOSCOPY;  Service: Cardiovascular;  Laterality: N/A;   CARDIOVERSION N/A 05/10/2019    Procedure: CARDIOVERSION;  Surgeon: Dolores Patty, MD;  Location: [facility] ENDOSCOPY;  Service: Cardiovascular;  Laterality: N/A;   CARDIOVERSION N/A 11/23/2020   Procedure: CARDIOVERSION;  Surgeon: Chrystie Nose, MD;  Location: [facility] ENDOSCOPY;  Service: Cardiovascular;  Laterality: N/A;   CARDIOVERSION N/A 04/16/2021   Procedure: CARDIOVERSION;  Surgeon: Thomasene Ripple, DO;  Location: [facility] ENDOSCOPY;  Service: Cardiovascular;  Laterality: N/A;   COLONOSCOPY WITH PROPOFOL N/A 12/20/2019   Procedure: COLONOSCOPY WITH PROPOFOL;  Surgeon: Benancio Deeds, MD;  Location: [facility] ENDOSCOPY;  Service: Gastroenterology;  Laterality: N/A;   POLYPECTOMY  12/20/2019   Procedure: POLYPECTOMY;  Surgeon: Benancio Deeds, MD;  Location: [facility] ENDOSCOPY;  Service: Gastroenterology;;   TEE WITHOUT CARDIOVERSION N/A 03/05/2022   Procedure: TRANSESOPHAGEAL ECHOCARDIOGRAM (TEE);  Surgeon: Maisie Fus, MD;  Location: [facility] ENDOSCOPY;  Service: Cardiovascular;  Laterality: N/A;   UMBILICAL HERNIA REPAIR  1992    Family History  Problem Relation Age of Onset   Hypertension Mother    Diabetes Father    CAD Father    Clotting disorder Father    Heart disease Father        transplant   Stomach cancer Father    Allergies Brother    Colon cancer Neg Hx    Colon polyps Neg Hx    Esophageal cancer Neg Hx    Rectal cancer Neg Hx     Social History   Socioeconomic History   Marital status: Divorced    Spouse name: Not on file   Number of children: 2   Years of education: 12   Highest education level: Not on file  Occupational History   Occupation: Drives dump truck    Comment:     Occupation: IT sales professional    Comment: Disabled  Tobacco Use   Smoking status: Former    Packs/day: 0.25    Years: 1.00    Additional pack years: 0.00    Total pack years: 0.25    Types: Cigarettes    Quit date: 07/23/1989    Years since quitting: 33.0   Smokeless tobacco: Never   Tobacco comments:    Former smoker 03/06/2021   Vaping Use   Vaping Use: Never used  Substance and Sexual Activity   Alcohol use: No    Alcohol/week: 0.0 standard drinks of alcohol   Drug use: No   Sexual activity: Not Currently  Other Topics Concern   Not on file  Social History Narrative   Pt lives in [place], alone.   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   Has 2 sons in [place]      No living will   Would want sons to make decisions for him   Would accept resuscitation  Social Determinants of Health   Financial Resource Strain: Low Risk  (01/19/2018)   Overall Financial Resource Strain (CARDIA)    Difficulty of Paying Living Expenses: Not hard at all  Food Insecurity: No Food Insecurity (01/19/2018)   Hunger Vital Sign    Worried About Running Out of Food in the Last Year: Never true    Ran Out of Food in the Last Year: Never true  Transportation Needs: No Transportation Needs (01/19/2018)   PRAPARE - Administrator, Civil Service (Medical): No    Lack of Transportation (Non-Medical): No  Physical Activity: Not on file  Stress: Not on file  Social Connections: Not on file  Intimate Partner Violence: Not on file   Review of Systems  Constitutional:        Has lost some more weight Wears seat belt Does try to exercise---30 minutes on treadmill/weights  HENT:  Negative for hearing loss and tinnitus.        Hears heart in right ear Needs dental attention  Eyes:  Negative for visual disturbance.       No diplopia or unilateral vision loss  Respiratory:  Positive for shortness of breath. Negative for chest tightness.   Cardiovascular:  Positive for leg swelling. Negative for chest pain and palpitations.  Gastrointestinal:  Negative for blood in stool.       Some constipation---will increase vegetable/pears/apples  Endocrine: Negative for polydipsia and polyuria.  Genitourinary:        Frequency only with diuretics Occ slow urine---flow is okay. Empties okay No  sexual problems  Musculoskeletal:  Negative for back pain and joint swelling.       Some knee pain--tylenol prn  Skin:  Negative for rash.  Allergic/Immunologic: Negative for environmental allergies and immunocompromised state.  Neurological:  Negative for dizziness, syncope, light-headedness and headaches.  Hematological:  Negative for adenopathy. Bruises/bleeds easily.  Psychiatric/Behavioral:  Negative for dysphoric mood and sleep disturbance. The patient is not nervous/anxious.        Objective:   Physical Exam Constitutional:      Appearance: Normal appearance. He is obese.  HENT:     Mouth/Throat:     Pharynx: No oropharyngeal exudate or posterior oropharyngeal erythema.  Eyes:     Conjunctiva/sclera: Conjunctivae normal.     Pupils: Pupils are equal, round, and reactive to light.  Cardiovascular:     Rate and Rhythm: Normal rate and regular rhythm.     Pulses: Normal pulses.     Heart sounds:     No gallop.     Comments: Gr 3/6 early aortic diastolic murmur Pulmonary:     Effort: Pulmonary effort is normal.     Breath sounds: Normal breath sounds. No wheezing or rales.  Abdominal:     Palpations: Abdomen is soft.     Tenderness: There is no abdominal tenderness.  Musculoskeletal:     Cervical back: Neck supple.     Right lower leg: No edema.     Left lower leg: No edema.  Lymphadenopathy:     Cervical: No cervical adenopathy.  Skin:    Findings: No lesion or rash.  Neurological:     General: No focal deficit present.     Mental Status: He is alert and oriented to person, place, and time.  Psychiatric:        Mood and Affect: Mood normal.        Behavior: Behavior normal.  Assessment & Plan:

## 2022-08-12 NOTE — Assessment & Plan Note (Signed)
EF 40-45% Doing well with diuretic therapy with torsemide plus metolazone prn Carvedilol 6/25 bid, hydralazine 100 tid , isosorbide 120

## 2022-08-12 NOTE — Assessment & Plan Note (Signed)
Doing well Weight drifting down Tries to exercise Colon due 2031 Will check PSA Flu vaccine in the fall Updated COVID now

## 2022-08-12 NOTE — Assessment & Plan Note (Signed)
Uses the CPAP every night 

## 2022-08-12 NOTE — Assessment & Plan Note (Signed)
Weight persistently going down with lifestyle work

## 2022-08-14 ENCOUNTER — Inpatient Hospital Stay: Payer: Managed Care, Other (non HMO)

## 2022-08-14 VITALS — BP 135/53 | HR 55 | Temp 97.9°F | Resp 16

## 2022-08-14 DIAGNOSIS — E538 Deficiency of other specified B group vitamins: Secondary | ICD-10-CM

## 2022-08-14 DIAGNOSIS — I130 Hypertensive heart and chronic kidney disease with heart failure and stage 1 through stage 4 chronic kidney disease, or unspecified chronic kidney disease: Secondary | ICD-10-CM | POA: Diagnosis not present

## 2022-08-14 MED ORDER — SODIUM CHLORIDE 0.9 % IV SOLN
200.0000 mg | Freq: Once | INTRAVENOUS | Status: AC
  Administered 2022-08-14: 200 mg via INTRAVENOUS
  Filled 2022-08-14: qty 200

## 2022-08-14 MED ORDER — SODIUM CHLORIDE 0.9 % IV SOLN
INTRAVENOUS | Status: DC
  Filled 2022-08-14: qty 250

## 2022-08-14 NOTE — Progress Notes (Signed)
Patient tolerated Venofer without any complications. Patient refused to stay for 30 minute observation period. Vital signs stable at discharge.

## 2022-08-20 ENCOUNTER — Encounter (HOSPITAL_COMMUNITY): Payer: Self-pay

## 2022-09-02 ENCOUNTER — Other Ambulatory Visit (HOSPITAL_COMMUNITY): Payer: Self-pay

## 2022-09-02 DIAGNOSIS — I5042 Chronic combined systolic (congestive) and diastolic (congestive) heart failure: Secondary | ICD-10-CM

## 2022-09-02 MED ORDER — TORSEMIDE 20 MG PO TABS
60.0000 mg | ORAL_TABLET | Freq: Two times a day (BID) | ORAL | 3 refills | Status: DC
Start: 2022-09-02 — End: 2023-04-15

## 2022-09-02 MED ORDER — ISOSORBIDE MONONITRATE ER 120 MG PO TB24: 120.0000 mg | ORAL_TABLET | Freq: Every day | ORAL | 3 refills | Status: DC

## 2022-09-09 ENCOUNTER — Other Ambulatory Visit: Payer: Self-pay

## 2022-09-09 MED ORDER — ALLOPURINOL 300 MG PO TABS: 300.0000 mg | ORAL_TABLET | Freq: Every day | ORAL | 1 refills | Status: DC

## 2022-09-29 ENCOUNTER — Encounter (HOSPITAL_COMMUNITY): Payer: Self-pay | Admitting: Physician Assistant

## 2022-09-29 ENCOUNTER — Ambulatory Visit (HOSPITAL_COMMUNITY)
Admission: RE | Admit: 2022-09-29 | Discharge: 2022-09-29 | Disposition: A | Discharge status: Home / Self Care | Payer: Managed Care, Other (non HMO) | Source: Ambulatory Visit | Attending: Physician Assistant | Admitting: Physician Assistant

## 2022-09-29 VITALS — BP 116/62 | HR 47 | Ht 67.0 in | Wt 302.4 lb

## 2022-09-29 DIAGNOSIS — N189 Chronic kidney disease, unspecified: Secondary | ICD-10-CM | POA: Diagnosis not present

## 2022-09-29 DIAGNOSIS — E669 Obesity, unspecified: Secondary | ICD-10-CM | POA: Insufficient documentation

## 2022-09-29 DIAGNOSIS — G4733 Obstructive sleep apnea (adult) (pediatric): Secondary | ICD-10-CM | POA: Diagnosis not present

## 2022-09-29 DIAGNOSIS — I4819 Other persistent atrial fibrillation: Secondary | ICD-10-CM | POA: Insufficient documentation

## 2022-09-29 DIAGNOSIS — Z6841 Body Mass Index (BMI) 40.0 and over, adult: Secondary | ICD-10-CM | POA: Diagnosis not present

## 2022-09-29 DIAGNOSIS — I5022 Chronic systolic (congestive) heart failure: Secondary | ICD-10-CM | POA: Diagnosis not present

## 2022-09-29 DIAGNOSIS — Z5181 Encounter for therapeutic drug level monitoring: Secondary | ICD-10-CM | POA: Diagnosis not present

## 2022-09-29 DIAGNOSIS — Z7901 Long term (current) use of anticoagulants: Secondary | ICD-10-CM | POA: Insufficient documentation

## 2022-09-29 DIAGNOSIS — Z79899 Other long term (current) drug therapy: Secondary | ICD-10-CM | POA: Insufficient documentation

## 2022-09-29 DIAGNOSIS — I130 Hypertensive heart and chronic kidney disease with heart failure and stage 1 through stage 4 chronic kidney disease, or unspecified chronic kidney disease: Secondary | ICD-10-CM | POA: Insufficient documentation

## 2022-09-29 DIAGNOSIS — D6869 Other thrombophilia: Secondary | ICD-10-CM | POA: Diagnosis not present

## 2022-09-29 LAB — TSH: TSH: 2.431 u[IU]/mL (ref 0.350–4.500)

## 2022-09-29 LAB — T4, FREE: Free T4: 1.52 ng/dL — ABNORMAL HIGH (ref 0.61–1.12)

## 2022-09-29 MED ORDER — AMIODARONE HCL 200 MG PO TABS: 100.0000 mg | ORAL_TABLET | Freq: Every day | ORAL | 1 refills | Status: DC

## 2022-09-29 NOTE — Patient Instructions (Signed)
Decrease amiodarone to 100mg once a day (1/2 of your 200mg tablet) °

## 2022-09-29 NOTE — Progress Notes (Signed)
Primary Care Physician: Karie Schwalbe, MD Primary Cardiologist: Arvilla Meres, MD Electrophysiologist: Maurice Small, MD  Referring Physician: Dr Okey Dupre Gersten is a 62 y.o. male with a history of CHFrEF, COPD, HTN, CKD, OSA, morbid obesity, atrial flutter, atrial fibrillation who presents for follow up in the [facility]. He underwent ablation of atrial flutter by Dr. Johney Frame in May 2019. He has had persistent atrial fibrillation. In Feb 2021, cardioversion was attempted but failed. He was maintained on amiodarone for a while but had recurrence and underwent an ablation by Dr. Magdalene River at [facility] in July, 2023. In follow-up in our AF clinic the following month, he was back in AF. The ablation was complicated by a large hematoma. He was seen by Dr Nelly Laurence 03/31/22, no changes at that time. Patient is on Eliquis for a CHADS2VASC score of 2.  On follow up today, patient reports the he is doing well. He has not had any episodes of afib since his last visit. No bleeding issues on anticoagulation. He will occasionally take an extra torsemide when he feels like he is retaining fluid but has not done this recently.   Today, he denies symptoms of palpitations, chest pain, shortness of breath, orthopnea, PND, lower extremity edema, dizziness, presyncope, syncope, snoring, daytime somnolence, bleeding, or neurologic sequela. The patient is tolerating medications without difficulties and is otherwise without complaint today.    Atrial Fibrillation Risk Factors:  he does have symptoms or diagnosis of sleep apnea. he is compliant with CPAP therapy. he does not have a history of rheumatic fever.   Atrial Fibrillation Management history:  Previous antiarrhythmic drugs: amiodarone  Previous cardioversions: 10/14/18, 04/22/19, 05/10/19, 11/23/20, 04/16/21 Previous ablations: 07/22/17 (flutter), 09/2021 [facility])  Anticoagulation history: Xarelto,  Eliquis  ROS- All systems are reviewed and negative except as per the HPI above.   Physical Exam: BP 116/62   Pulse (!) 47   Ht 5\' 7"  (1.702 m)   Wt (!) 137.2 kg   BMI 47.36 kg/m   GEN: Well nourished, well developed in no acute distress NECK: No JVD; No carotid bruits CARDIAC: Regular rate and rhythm, no murmurs, rubs, gallops RESPIRATORY:  Clear to auscultation without rales, wheezing or rhonchi  ABDOMEN: Soft, non-tender, non-distended EXTREMITIES:  No edema; No deformity   Wt Readings from Last 3 Encounters:  09/29/22 (!) 137.2 kg  08/12/22 133.8 kg  08/05/22 (!) 140.2 kg     EKG today demonstrates  SB, slow R wave prog Vent. rate 47 BPM PR interval 198 ms QRS duration 108 ms QT/QTcB 562/497 ms  Echo 04/15/21 demonstrated   1. Left ventricular ejection fraction, by estimation, is 40 to 45%. The  left ventricle has mildly decreased function. The left ventricle  demonstrates global hypokinesis. The left ventricular internal cavity size  was moderately dilated. Left ventricular diastolic parameters are indeterminate. The average left ventricular global longitudinal strain is -6.0 %. The global longitudinal strain is abnormal.   2. Right ventricular systolic function is normal. The right ventricular  size is normal.   3. Shadowing in dome of LA cannot r/o calcified thrombus . Left atrial  size was moderately dilated.   4. The mitral valve is abnormal. Mild mitral valve regurgitation. No  evidence of mitral stenosis.   5. The aortic valve is tricuspid. There is mild calcification of the  aortic valve. There is mild thickening of the aortic valve. Aortic valve  regurgitation is mild. Aortic  valve sclerosis is present, with no evidence  of aortic valve stenosis.   6. The inferior vena cava is normal in size with greater than 50%  respiratory variability, suggesting right atrial pressure of 3 mmHg.    CHA2DS2-VASc Score = 2  The patient's score is based upon: CHF  History: 1 HTN History: 1 Diabetes History: 0 Stroke History: 0 Vascular Disease History: 0 Age Score: 0 Gender Score: 0       ASSESSMENT AND PLAN: Persistent Atrial Fibrillation (ICD10:  I48.19) The patient's CHA2DS2-VASc score is 2, indicating a 2.2% annual risk of stroke.   Patient appears to be maintaining SR. Patient concerned about long term side effects of amiodarone. Will like to keep him on lowest effective dose. Will decrease amiodarone to 100 mg daily.  Recent cmet and CXR reviewed. Check TSH/Free T4 today. Continue Eliquis 5 mg BID Continue carvedilol 12.5 mg BID  Secondary Hypercoagulable State (ICD10:  D68.69) The patient is at significant risk for stroke/thromboembolism based upon his CHA2DS2-VASc Score of 2.  Continue Apixaban (Eliquis).   Obesity Body mass index is 47.36 kg/m.  Encouraged lifestyle modification  OSA  Encouraged nightly CPAP  Chronic HFrEF EF 40-45% Fluid status appears stable today Followed in the [facility]  HTN Stable on current regimen    Follow up in the AF clinic in 6 months.    Jorja Loa PA-C Afib Clinic [facility] [address] [place], [place] [address] [phone]

## 2022-10-10 ENCOUNTER — Other Ambulatory Visit (HOSPITAL_COMMUNITY): Payer: Self-pay | Admitting: *Deleted

## 2022-10-10 MED ORDER — AMIODARONE HCL 200 MG PO TABS: 100.0000 mg | ORAL_TABLET | Freq: Every day | ORAL | 2 refills | Status: DC

## 2022-10-16 ENCOUNTER — Other Ambulatory Visit (HOSPITAL_COMMUNITY): Payer: Self-pay

## 2022-10-16 DIAGNOSIS — I5042 Chronic combined systolic (congestive) and diastolic (congestive) heart failure: Secondary | ICD-10-CM

## 2022-10-16 MED ORDER — ISOSORBIDE MONONITRATE ER 120 MG PO TB24
120.0000 mg | ORAL_TABLET | Freq: Every day | ORAL | 3 refills | Status: DC
Start: 2022-10-16 — End: 2023-04-24

## 2022-10-16 MED ORDER — AMIODARONE HCL 200 MG PO TABS: 200.0000 mg | ORAL_TABLET | Freq: Every day | ORAL | 3 refills | Status: DC

## 2022-12-01 ENCOUNTER — Other Ambulatory Visit: Payer: Self-pay | Admitting: Internal Medicine

## 2023-02-05 ENCOUNTER — Inpatient Hospital Stay: Payer: Managed Care, Other (non HMO) | Admitting: Oncology

## 2023-02-05 ENCOUNTER — Encounter: Payer: Self-pay | Admitting: Oncology

## 2023-02-05 ENCOUNTER — Inpatient Hospital Stay: Payer: Managed Care, Other (non HMO)

## 2023-02-05 ENCOUNTER — Inpatient Hospital Stay: Payer: Managed Care, Other (non HMO) | Attending: Oncology

## 2023-02-05 VITALS — BP 147/55 | HR 58 | Temp 97.5°F | Resp 18 | Wt 311.4 lb

## 2023-02-05 VITALS — BP 165/58 | HR 54 | Temp 96.0°F | Resp 19

## 2023-02-05 DIAGNOSIS — N184 Chronic kidney disease, stage 4 (severe): Secondary | ICD-10-CM

## 2023-02-05 DIAGNOSIS — I509 Heart failure, unspecified: Secondary | ICD-10-CM | POA: Diagnosis not present

## 2023-02-05 DIAGNOSIS — Z833 Family history of diabetes mellitus: Secondary | ICD-10-CM | POA: Diagnosis not present

## 2023-02-05 DIAGNOSIS — D631 Anemia in chronic kidney disease: Secondary | ICD-10-CM

## 2023-02-05 DIAGNOSIS — E538 Deficiency of other specified B group vitamins: Secondary | ICD-10-CM | POA: Insufficient documentation

## 2023-02-05 DIAGNOSIS — I480 Paroxysmal atrial fibrillation: Secondary | ICD-10-CM | POA: Diagnosis not present

## 2023-02-05 DIAGNOSIS — G4733 Obstructive sleep apnea (adult) (pediatric): Secondary | ICD-10-CM | POA: Insufficient documentation

## 2023-02-05 DIAGNOSIS — I130 Hypertensive heart and chronic kidney disease with heart failure and stage 1 through stage 4 chronic kidney disease, or unspecified chronic kidney disease: Secondary | ICD-10-CM | POA: Insufficient documentation

## 2023-02-05 DIAGNOSIS — Z832 Family history of diseases of the blood and blood-forming organs and certain disorders involving the immune mechanism: Secondary | ICD-10-CM | POA: Insufficient documentation

## 2023-02-05 DIAGNOSIS — Z79899 Other long term (current) drug therapy: Secondary | ICD-10-CM | POA: Diagnosis not present

## 2023-02-05 DIAGNOSIS — Z8249 Family history of ischemic heart disease and other diseases of the circulatory system: Secondary | ICD-10-CM | POA: Diagnosis not present

## 2023-02-05 DIAGNOSIS — Z87891 Personal history of nicotine dependence: Secondary | ICD-10-CM | POA: Diagnosis not present

## 2023-02-05 DIAGNOSIS — Z800 Family history of malignant neoplasm of digestive organs: Secondary | ICD-10-CM | POA: Diagnosis not present

## 2023-02-05 DIAGNOSIS — Z86711 Personal history of pulmonary embolism: Secondary | ICD-10-CM | POA: Insufficient documentation

## 2023-02-05 DIAGNOSIS — Z86718 Personal history of other venous thrombosis and embolism: Secondary | ICD-10-CM | POA: Insufficient documentation

## 2023-02-05 DIAGNOSIS — Z7901 Long term (current) use of anticoagulants: Secondary | ICD-10-CM | POA: Diagnosis not present

## 2023-02-05 DIAGNOSIS — R634 Abnormal weight loss: Secondary | ICD-10-CM | POA: Insufficient documentation

## 2023-02-05 LAB — CBC WITH DIFFERENTIAL (CANCER CENTER ONLY)
Abs Immature Granulocytes: 0 10*3/uL (ref 0.00–0.07)
Basophils Absolute: 0 10*3/uL (ref 0.0–0.1)
Basophils Relative: 1 %
Eosinophils Absolute: 0 10*3/uL (ref 0.0–0.5)
Eosinophils Relative: 1 %
HCT: 35.6 % — ABNORMAL LOW (ref 39.0–52.0)
Hemoglobin: 11.1 g/dL — ABNORMAL LOW (ref 13.0–17.0)
Immature Granulocytes: 0 %
Lymphocytes Relative: 23 %
Lymphs Abs: 0.7 10*3/uL (ref 0.7–4.0)
MCH: 29.5 pg (ref 26.0–34.0)
MCHC: 31.2 g/dL (ref 30.0–36.0)
MCV: 94.7 fL (ref 80.0–100.0)
Monocytes Absolute: 0.3 10*3/uL (ref 0.1–1.0)
Monocytes Relative: 11 %
Neutro Abs: 1.9 10*3/uL (ref 1.7–7.7)
Neutrophils Relative %: 64 %
Platelet Count: 120 10*3/uL — ABNORMAL LOW (ref 150–400)
RBC: 3.76 MIL/uL — ABNORMAL LOW (ref 4.22–5.81)
RDW: 15.9 % — ABNORMAL HIGH (ref 11.5–15.5)
WBC Count: 3 10*3/uL — ABNORMAL LOW (ref 4.0–10.5)
nRBC: 0 % (ref 0.0–0.2)

## 2023-02-05 LAB — IRON AND TIBC
Iron: 72 ug/dL (ref 45–182)
Saturation Ratios: 22 % (ref 17.9–39.5)
TIBC: 329 ug/dL (ref 250–450)
UIBC: 257 ug/dL

## 2023-02-05 LAB — VITAMIN B12: Vitamin B-12: 324 pg/mL (ref 180–914)

## 2023-02-05 LAB — FERRITIN: Ferritin: 96 ng/mL (ref 24–336)

## 2023-02-05 MED ORDER — IRON SUCROSE 20 MG/ML IV SOLN
200.0000 mg | Freq: Once | INTRAVENOUS | Status: AC
  Administered 2023-02-05: 200 mg via INTRAVENOUS
  Filled 2023-02-05: qty 10

## 2023-02-05 MED ORDER — SODIUM CHLORIDE 0.9% FLUSH
10.0000 mL | Freq: Once | INTRAVENOUS | Status: AC | PRN
  Administered 2023-02-05: 10 mL
  Filled 2023-02-05: qty 10

## 2023-02-05 NOTE — Progress Notes (Signed)
Hematology/Oncology Progress note [phone] [phone]  CHIEF COMPLAINTS/REASON FOR VISIT:  anemia of chronic kidney disease, B12 deficiency.  ASSESSMENT & PLAN:   Anemia of chronic renal failure Anemia, likely secondary to chronic kidney disease Labs reviewed and discussed with patient. Lab Results  Component Value Date   HGB 11.1 (L) 02/05/2023   TIBC 329 02/05/2023   IRONPCTSAT 22 02/05/2023   FERRITIN 96 02/05/2023   Recommend additional Venofer 200mg  x 1    B12 deficiency B12 level is pending.    Chronic kidney disease, stage IV (severe) (HCC) Encourage oral hydration and avoid nephrotoxins.    Orders Placed This Encounter  Procedures   CBC with Differential (Cancer Center Only)    Standing Status:   Future    Standing Expiration Date:   02/05/2024   Iron and TIBC    Standing Status:   Future    Standing Expiration Date:   02/05/2024   Ferritin    Standing Status:   Future    Standing Expiration Date:   02/05/2024   Vitamin B12    Standing Status:   Future    Standing Expiration Date:   02/05/2024   Follow-up 6 months. All questions were answered. The patient knows to call the clinic with any problems, questions or concerns.  Phillip Patience, MD, PhD [facility] Hematology Oncology 02/05/2023   HISTORY OF PRESENTING ILLNESS:   Phillip [name] is a  62 y.o.  male with PMH listed below was seen in consultation at the request of  Tillman Abide I, MD  for evaluation of anemia of chronic kidney disease.  Patient has chronic kidney disease and follows up with Dr. Thedore Mins.  Baseline creatinine 2.9. History of pulmonary embolism [2010], left lower extremity DVT[2017], atrial fibrillation, currently on Xarelto 20 mg daily.  Managed by cardiology/CHF clinic Dr.Bensimhon.  Patient denies any melena, hematochezia.  07/16/2021 CBC showed hemoglobin 10.4, MCV 91, platelet count 10 9000, white count 2.6. Patient denies alcohol use.  He has intentionally  lost weight. Patient was referred to establish care with hematology for further evaluation.  INTERVAL HISTORY Phillip [name] is a 62 y.o. male who has above history reviewed by me today presents for follow up visit for management of anemia due to chronic kidney disease, vitamin B12 deficiency. Patient reports feeling well. Chronic intermittent fatigue, unchanged. He is on Eliquis 5mg  BID for A-fib.  Follows up with [facility] cardiology. Chronic kidney disease.  Follows up with nephrology. No other new complaints.  He denies bleeding events.  Review of Systems  Constitutional:  Negative for appetite change, chills, fatigue, fever and unexpected weight change.  HENT:   Negative for hearing loss and voice change.   Eyes:  Negative for eye problems and icterus.  Respiratory:  Negative for chest tightness, cough and shortness of breath.   Cardiovascular:  Negative for chest pain and leg swelling.  Gastrointestinal:  Negative for abdominal distention and abdominal pain.  Endocrine: Negative for hot flashes.  Genitourinary:  Negative for difficulty urinating, dysuria and frequency.   Musculoskeletal:  Negative for arthralgias.  Skin:  Negative for itching and rash.  Neurological:  Negative for light-headedness and numbness.  Hematological:  Negative for adenopathy. Does not bruise/bleed easily.  Psychiatric/Behavioral:  Negative for confusion.     MEDICAL HISTORY:  Past Medical History:  Diagnosis Date   Asthma    CHF (congestive heart failure) (HCC) 11/15   EF 40-45%, improved with follow-up   Chronic kidney disease    COPD (  chronic obstructive pulmonary disease) (HCC)    Deep vein thrombosis (DVT) of lower extremity (HCC) 08/17/2015   DVT (deep venous thrombosis) (HCC)    Gout    Hypertension    Hypertensive cardiovascular disease    Kidney dysfunction    Morbid obesity (HCC)    OSA on CPAP    Paroxysmal atrial fibrillation (HCC)    PNA (pneumonia)    Pulmonary embolism (HCC) 2010   left  leg DVT   Sleep apnea    uses cpap    SURGICAL HISTORY: Past Surgical History:  Procedure Laterality Date   A-FLUTTER ABLATION N/A 07/22/2017   Procedure: A-FLUTTER ABLATION;  Surgeon: Hillis Range, MD;  Location: [facility] INVASIVE CV LAB;  Service: Cardiovascular;  Laterality: N/A;   CARDIOVERSION N/A 10/14/2018   Procedure: CARDIOVERSION;  Surgeon: Dolores Patty, MD;  Location: [facility] ENDOSCOPY;  Service: Cardiovascular;  Laterality: N/A;   CARDIOVERSION N/A 04/22/2019   Procedure: CARDIOVERSION;  Surgeon: Vesta Mixer, MD;  Location: [facility] ENDOSCOPY;  Service: Cardiovascular;  Laterality: N/A;   CARDIOVERSION N/A 05/10/2019   Procedure: CARDIOVERSION;  Surgeon: Dolores Patty, MD;  Location: [facility] ENDOSCOPY;  Service: Cardiovascular;  Laterality: N/A;   CARDIOVERSION N/A 11/23/2020   Procedure: CARDIOVERSION;  Surgeon: Chrystie Nose, MD;  Location: [facility] ENDOSCOPY;  Service: Cardiovascular;  Laterality: N/A;   CARDIOVERSION N/A 04/16/2021   Procedure: CARDIOVERSION;  Surgeon: Thomasene Ripple, DO;  Location: [facility] ENDOSCOPY;  Service: Cardiovascular;  Laterality: N/A;   COLONOSCOPY WITH PROPOFOL N/A 12/20/2019   Procedure: COLONOSCOPY WITH PROPOFOL;  Surgeon: Benancio Deeds, MD;  Location: [facility] ENDOSCOPY;  Service: Gastroenterology;  Laterality: N/A;   POLYPECTOMY  12/20/2019   Procedure: POLYPECTOMY;  Surgeon: Benancio Deeds, MD;  Location: [facility] ENDOSCOPY;  Service: Gastroenterology;;   TEE WITHOUT CARDIOVERSION N/A 03/05/2022   Procedure: TRANSESOPHAGEAL ECHOCARDIOGRAM (TEE);  Surgeon: Maisie Fus, MD;  Location: [facility] ENDOSCOPY;  Service: Cardiovascular;  Laterality: N/A;   UMBILICAL HERNIA REPAIR  1992    SOCIAL HISTORY: Social History   Socioeconomic History   Marital status: Divorced    Spouse name: Not on file   Number of children: 2   Years of education: 12   Highest education level: Not on file  Occupational History   Occupation: Drives dump truck    Comment:      Occupation: IT sales professional    Comment: Disabled  Tobacco Use   Smoking status: Former    Current packs/day: 0.00    Average packs/day: 0.3 packs/day for 1 year (0.3 ttl pk-yrs)    Types: Cigarettes    Start date: 07/23/1988    Quit date: 07/23/1989    Years since quitting: 33.5   Smokeless tobacco: Never   Tobacco comments:    Former smoker 03/06/2021  Vaping Use   Vaping status: Never Used  Substance and Sexual Activity   Alcohol use: No    Alcohol/week: 0.0 standard drinks of alcohol   Drug use: No   Sexual activity: Not Currently  Other Topics Concern   Not on file  Social History Narrative   Pt lives in [place], alone.   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   Has 2 sons in [place]      No living will   Would want sons to make decisions for him   Would accept resuscitation   Social Determinants of Health   Financial Resource Strain: Low Risk  (01/19/2018)   Overall Financial Resource Strain (  CARDIA)    Difficulty of Paying Living Expenses: Not hard at all  Food Insecurity: No Food Insecurity (01/19/2018)   Hunger Vital Sign    Worried About Running Out of Food in the Last Year: Never true    Ran Out of Food in the Last Year: Never true  Transportation Needs: No Transportation Needs (01/19/2018)   PRAPARE - Administrator, Civil Service (Medical): No    Lack of Transportation (Non-Medical): No  Physical Activity: Not on file  Stress: Not on file  Social Connections: Not on file  Intimate Partner Violence: Not on file    FAMILY HISTORY: Family History  Problem Relation Age of Onset   Hypertension Mother    Diabetes Father    CAD Father    Clotting disorder Father    Heart disease Father        transplant   Stomach cancer Father    Allergies Brother    Colon cancer Neg Hx    Colon polyps Neg Hx    Esophageal cancer Neg Hx    Rectal cancer Neg Hx     ALLERGIES:  is allergic to other.  MEDICATIONS:  Current  Outpatient Medications  Medication Sig Dispense Refill   albuterol (PROAIR HFA) 108 (90 Base) MCG/ACT inhaler Inhale 2 puffs into the lungs every 4 (four) hours as needed for wheezing or shortness of breath. 1 Inhaler 3   albuterol (PROVENTIL) (2.5 MG/3ML) 0.083% nebulizer solution INHALE 1 VIAL VIA NEBULIZER EVERY 6 HOURS AS NEEDED FOR WHEEZING/SHORTNESS OF BREATH. 375 mL 11   allopurinol (ZYLOPRIM) 300 MG tablet Take 1 tablet (300 mg total) by mouth daily. 90 tablet 1   amiodarone (PACERONE) 200 MG tablet Take 1 tablet (200 mg total) by mouth daily. 90 tablet 3   apixaban (ELIQUIS) 5 MG TABS tablet Take 1 tablet (5 mg total) by mouth 2 (two) times daily. 180 tablet 3   calcium carbonate (OS-CAL - DOSED IN MG OF ELEMENTAL CALCIUM) 1250 (500 Ca) MG tablet Patient takes 1 tablet by mouth every 2 days.     carvedilol (COREG) 6.25 MG tablet Take 2 tablets (12.5 mg total) by mouth 2 (two) times daily with a meal. 120 tablet 5   Cholecalciferol (VITAMIN D3) 50 MCG (2000 UT) TABS Take 2,000 Units by mouth daily.     hydrALAZINE (APRESOLINE) 100 MG tablet Take 1 tablet (100 mg total) by mouth every 8 (eight) hours. 90 tablet 0   isosorbide mononitrate (IMDUR) 120 MG 24 hr tablet Take 1 tablet (120 mg total) by mouth daily. 90 tablet 3   oxymetazoline (AFRIN NASAL SPRAY) 0.05 % nasal spray Place 1 spray into both nostrils 2 (two) times daily as needed for congestion.     potassium chloride SA (KLOR-CON M20) 20 MEQ tablet Take 2 tablets (40 mEq total) by mouth 2 (two) times daily. 360 tablet 3   SYMBICORT 80-4.5 MCG/ACT inhaler USE 2 INHALATIONS TWICE A DAY (Patient taking differently: Inhale 2 puffs into the lungs 2 (two) times daily.) 30.6 g 3   torsemide (DEMADEX) 20 MG tablet Take 3 tablets (60 mg total) by mouth 2 (two) times daily. 540 tablet 3   metolazone (ZAROXOLYN) 2.5 MG tablet Take 1 tablet (2.5 mg total) by mouth as directed. As directed by Heart Failure Clinic (Patient not taking: Reported on  09/29/2022) 5 tablet 1   No current facility-administered medications for this visit.     PHYSICAL EXAMINATION: ECOG PERFORMANCE STATUS: 1 - Symptomatic  but completely ambulatory Vitals:   02/05/23 1435 02/05/23 1445  BP: (!) 157/73 (!) 147/55  Pulse: (!) 58   Resp: 18   Temp: (!) 97.5 F (36.4 C)   SpO2: 94%    Filed Weights   02/05/23 1435  Weight: (!) 311 lb 6.4 oz (141.3 kg)    Physical Exam Constitutional:      General: He is not in acute distress.    Appearance: He is obese.  HENT:     Head: Normocephalic and atraumatic.  Eyes:     General: No scleral icterus. Cardiovascular:     Rate and Rhythm: Normal rate and regular rhythm.     Heart sounds: Normal heart sounds.  Pulmonary:     Effort: Pulmonary effort is normal. No respiratory distress.     Breath sounds: No wheezing.  Abdominal:     General: Bowel sounds are normal. There is no distension.     Palpations: Abdomen is soft.  Musculoskeletal:        General: No deformity. Normal range of motion.     Cervical back: Normal range of motion and neck supple.  Skin:    General: Skin is warm and dry.     Findings: No erythema or rash.  Neurological:     Mental Status: He is alert and oriented to person, place, and time. Mental status is at baseline.     Cranial Nerves: No cranial nerve deficit.  Psychiatric:        Mood and Affect: Mood normal.     LABORATORY DATA:  I have reviewed the data as listed    Latest Ref Rng & Units 02/05/2023   10:01 AM 08/05/2022   12:39 PM 06/03/2022   11:42 AM  CBC  WBC 4.0 - 10.5 K/uL 3.0  2.9  2.7   Hemoglobin 13.0 - 17.0 g/dL 16.1  09.6  04.5   Hematocrit 39.0 - 52.0 % 35.6  36.8  32.2   Platelets 150 - 400 K/uL 120  118  100     Recent Labs    06/11/22 1051 06/24/22 0931 08/05/22 1239  NA 140 142 142  K 3.3* 4.1 4.1  CL 98 102 103  CO2 34* 29 30  GLUCOSE 87 94 85  BUN 46* 40* 42*  CREATININE 3.37* 3.10* 2.95*  CALCIUM 9.1 9.3 8.8*  GFRNONAA 20* 22* 24*   PROT  --   --  6.4*  ALBUMIN  --   --  3.6  AST  --   --  38  ALT  --   --  28  ALKPHOS  --   --  94  BILITOT  --   --  1.0   Iron/TIBC/Ferritin/ %Sat    Component Value Date/Time   IRON 72 02/05/2023 1001   IRON 74 03/23/2017 1133   TIBC 329 02/05/2023 1001   TIBC 334 03/23/2017 1133   FERRITIN 96 02/05/2023 1001   IRONPCTSAT 22 02/05/2023 1001   IRONPCTSAT 22 03/23/2017 1133      RADIOGRAPHIC STUDIES: I have personally reviewed the radiological images as listed and agreed with the findings in the report. No results found.

## 2023-02-05 NOTE — Assessment & Plan Note (Addendum)
B12 level is pending.

## 2023-02-05 NOTE — Assessment & Plan Note (Signed)
Encourage oral hydration and avoid nephrotoxins.   

## 2023-02-05 NOTE — Assessment & Plan Note (Addendum)
Anemia, likely secondary to chronic kidney disease Labs reviewed and discussed with patient. Lab Results  Component Value Date   HGB 11.1 (L) 02/05/2023   TIBC 329 02/05/2023   IRONPCTSAT 22 02/05/2023   FERRITIN 96 02/05/2023   Recommend additional Venofer 200mg  x 1

## 2023-02-06 ENCOUNTER — Telehealth: Payer: Self-pay

## 2023-02-06 MED ORDER — CARVEDILOL 6.25 MG PO TABS: 12.5000 mg | ORAL_TABLET | Freq: Two times a day (BID) | ORAL | 1 refills | Status: DC

## 2023-02-06 MED ORDER — ALLOPURINOL 300 MG PO TABS: 300.0000 mg | ORAL_TABLET | Freq: Every day | ORAL | 1 refills | Status: DC

## 2023-02-06 NOTE — Telephone Encounter (Signed)
Rx sent electronically.  

## 2023-02-08 ENCOUNTER — Other Ambulatory Visit: Payer: Self-pay | Admitting: Oncology

## 2023-02-08 MED ORDER — VITAMIN B-12 1000 MCG PO TABS: 1000.0000 ug | ORAL_TABLET | Freq: Every day | ORAL | 1 refills | Status: DC

## 2023-02-09 ENCOUNTER — Other Ambulatory Visit (HOSPITAL_COMMUNITY): Payer: Self-pay | Admitting: Internal Medicine

## 2023-02-09 ENCOUNTER — Telehealth: Payer: Self-pay

## 2023-02-09 DIAGNOSIS — I5022 Chronic systolic (congestive) heart failure: Secondary | ICD-10-CM

## 2023-02-09 DIAGNOSIS — E876 Hypokalemia: Secondary | ICD-10-CM

## 2023-02-09 DIAGNOSIS — Z79899 Other long term (current) drug therapy: Secondary | ICD-10-CM

## 2023-02-09 NOTE — Telephone Encounter (Signed)
-----   Message from [name] sent at 02/08/2023  9:47 PM EST ----- Please let patient know that his B12 level is within normal limit, however, trending low.  I recommend him to take oral B12 daily. I will send a Rx

## 2023-02-09 NOTE — Telephone Encounter (Signed)
Called patient and informed him that his B12 level is within normal limits but it is trending low. Dr. Cathie Hoops recommends that he take B12 daily and she will send in Rx to pharmacy.

## 2023-02-14 ENCOUNTER — Other Ambulatory Visit (HOSPITAL_COMMUNITY): Payer: Self-pay | Admitting: Internal Medicine

## 2023-02-27 ENCOUNTER — Encounter: Payer: Self-pay | Admitting: Internal Medicine

## 2023-02-27 ENCOUNTER — Ambulatory Visit: Payer: Managed Care, Other (non HMO) | Admitting: Internal Medicine

## 2023-02-27 VITALS — BP 138/72 | HR 55 | Temp 98.3°F | Ht 67.0 in | Wt 311.0 lb

## 2023-02-27 DIAGNOSIS — I5042 Chronic combined systolic (congestive) and diastolic (congestive) heart failure: Secondary | ICD-10-CM

## 2023-02-27 DIAGNOSIS — N401 Enlarged prostate with lower urinary tract symptoms: Secondary | ICD-10-CM

## 2023-02-27 DIAGNOSIS — H11139 Conjunctival pigmentations, unspecified eye: Secondary | ICD-10-CM | POA: Insufficient documentation

## 2023-02-27 DIAGNOSIS — N400 Enlarged prostate without lower urinary tract symptoms: Secondary | ICD-10-CM | POA: Insufficient documentation

## 2023-02-27 DIAGNOSIS — R051 Acute cough: Secondary | ICD-10-CM

## 2023-02-27 LAB — CBC
HCT: 35.3 % — ABNORMAL LOW (ref 39.0–52.0)
Hemoglobin: 11.8 g/dL — ABNORMAL LOW (ref 13.0–17.0)
MCHC: 33.6 g/dL (ref 30.0–36.0)
MCV: 94.3 fL (ref 78.0–100.0)
Platelets: 111 10*3/uL — ABNORMAL LOW (ref 150.0–400.0)
RBC: 3.74 Mil/uL — ABNORMAL LOW (ref 4.22–5.81)
RDW: 17.1 % — ABNORMAL HIGH (ref 11.5–15.5)
WBC: 2.8 10*3/uL — ABNORMAL LOW (ref 4.0–10.5)

## 2023-02-27 LAB — HEPATIC FUNCTION PANEL
ALT: 16 U/L (ref 0–53)
AST: 19 U/L (ref 0–37)
Albumin: 3.6 g/dL (ref 3.5–5.2)
Alkaline Phosphatase: 85 U/L (ref 39–117)
Bilirubin, Direct: 0.2 mg/dL (ref 0.0–0.3)
Total Bilirubin: 0.9 mg/dL (ref 0.2–1.2)
Total Protein: 5.7 g/dL — ABNORMAL LOW (ref 6.0–8.3)

## 2023-02-27 LAB — RENAL FUNCTION PANEL
Albumin: 3.6 g/dL (ref 3.5–5.2)
BUN: 52 mg/dL — ABNORMAL HIGH (ref 6–23)
CO2: 37 meq/L — ABNORMAL HIGH (ref 19–32)
Calcium: 8.8 mg/dL (ref 8.4–10.5)
Chloride: 103 meq/L (ref 96–112)
Creatinine, Ser: 3.06 mg/dL — ABNORMAL HIGH (ref 0.40–1.50)
GFR: 21.36 mL/min — ABNORMAL LOW (ref >60.00)
Glucose, Bld: 83 mg/dL (ref 70–99)
Phosphorus: 3.1 mg/dL (ref 2.3–4.6)
Potassium: 3.9 meq/L (ref 3.5–5.1)
Sodium: 144 meq/L (ref 135–145)

## 2023-02-27 MED ORDER — TAMSULOSIN HCL 0.4 MG PO CAPS: 0.4000 mg | ORAL_CAPSULE | Freq: Every day | ORAL | 3 refills | Status: AC

## 2023-02-27 NOTE — Assessment & Plan Note (Signed)
Compensated on torsemide 60 bid Discussed changing second dose to early in afternoon to avoid nocturia

## 2023-02-27 NOTE — Assessment & Plan Note (Signed)
Doesn't seem to be jaundiced Will check labs though

## 2023-02-27 NOTE — Assessment & Plan Note (Signed)
Pretty classic symptoms Discussed options Will try tamsulosin

## 2023-02-27 NOTE — Progress Notes (Signed)
Subjective:    Patient ID: Phillip [name], male    DOB: 1960/09/10, 62 y.o.   MRN: [id number]  HPI Here with several concerns  He has noticed some discoloration in eyes Slightly yellow look No change in skin color No itching  Having trouble voiding Flow okay some times---but other times has feeling of built up pressure and doesn't come out well Dribbling and has to push it out No dysuria or hematuria Nocturia x 4 times  Intermittent cough Not sick No fever Notices it when in bed---asleep Wears CPAP--with humidifier (does clean it) Uses 2 small pillows--usually sleeps on back (or right side)  Current Outpatient Medications on File Prior to Visit  Medication Sig Dispense Refill   albuterol (PROAIR HFA) 108 (90 Base) MCG/ACT inhaler Inhale 2 puffs into the lungs every 4 (four) hours as needed for wheezing or shortness of breath. 1 Inhaler 3   albuterol (PROVENTIL) (2.5 MG/3ML) 0.083% nebulizer solution INHALE 1 VIAL VIA NEBULIZER EVERY 6 HOURS AS NEEDED FOR WHEEZING/SHORTNESS OF BREATH. 375 mL 11   allopurinol (ZYLOPRIM) 300 MG tablet Take 1 tablet (300 mg total) by mouth daily. 90 tablet 1   amiodarone (PACERONE) 200 MG tablet Take 1 tablet (200 mg total) by mouth daily. 90 tablet 3   apixaban (ELIQUIS) 5 MG TABS tablet Take 1 tablet (5 mg total) by mouth 2 (two) times daily. 180 tablet 3   calcium carbonate (OS-CAL - DOSED IN MG OF ELEMENTAL CALCIUM) 1250 (500 Ca) MG tablet Patient takes 1 tablet by mouth every 2 days.     carvedilol (COREG) 6.25 MG tablet Take 2 tablets (12.5 mg total) by mouth 2 (two) times daily with a meal. 180 tablet 1   Cholecalciferol (VITAMIN D3) 50 MCG (2000 UT) TABS Take 2,000 Units by mouth daily.     cyanocobalamin (VITAMIN B12) 1000 MCG tablet Take 1 tablet (1,000 mcg total) by mouth daily. 90 tablet 1   hydrALAZINE (APRESOLINE) 100 MG tablet Take 1 tablet (100 mg total) by mouth every 8 (eight) hours. 90 tablet 0   isosorbide mononitrate (IMDUR) 120 MG  24 hr tablet Take 1 tablet (120 mg total) by mouth daily. 90 tablet 3   KLOR-CON M20 20 MEQ tablet TAKE 2 TABLETS (40 MEQ) EVERY MORNING AND 1 TABLET (20 MEQ) EVERY EVENING 270 tablet 3   oxymetazoline (AFRIN NASAL SPRAY) 0.05 % nasal spray Place 1 spray into both nostrils 2 (two) times daily as needed for congestion.     SYMBICORT 80-4.5 MCG/ACT inhaler USE 2 INHALATIONS TWICE A DAY (Patient taking differently: Inhale 2 puffs into the lungs 2 (two) times daily.) 30.6 g 3   torsemide (DEMADEX) 20 MG tablet Take 3 tablets (60 mg total) by mouth 2 (two) times daily. 540 tablet 3   metolazone (ZAROXOLYN) 2.5 MG tablet Take 1 tablet (2.5 mg total) by mouth as directed. As directed by [facility] Failure Clinic (Patient not taking: Reported on 09/29/2022) 5 tablet 1   No current facility-administered medications on file prior to visit.    Allergies  Allergen Reactions   Other Anaphylaxis    mushrooms    Past Medical History:  Diagnosis Date   Asthma    CHF (congestive heart failure) (HCC) 11/15   EF 40-45%, improved with follow-up   Chronic kidney disease    COPD (chronic obstructive pulmonary disease) (HCC)    Deep vein thrombosis (DVT) of lower extremity (HCC) 08/17/2015   DVT (deep venous thrombosis) (HCC)    Gout  Hypertension    Hypertensive cardiovascular disease    Kidney dysfunction    Morbid obesity (HCC)    OSA on CPAP    Paroxysmal atrial fibrillation (HCC)    PNA (pneumonia)    Pulmonary embolism (HCC) 2010   left leg DVT   Sleep apnea    uses cpap    Past Surgical History:  Procedure Laterality Date   A-FLUTTER ABLATION N/A 07/22/2017   Procedure: A-FLUTTER ABLATION;  Surgeon: Hillis Range, MD;  Location: [facility] INVASIVE CV LAB;  Service: Cardiovascular;  Laterality: N/A;   CARDIOVERSION N/A 10/14/2018   Procedure: CARDIOVERSION;  Surgeon: Dolores Patty, MD;  Location[facility] ENDOSCOPY;  Service: Cardiovascular;  Laterality: N/A;   CARDIOVERSION N/A 04/22/2019   Procedure:  CARDIOVERSION;  Surgeon: Vesta Mixer, MD;  Location: [facility] ENDOSCOPY;  Service: Cardiovascular;  Laterality: N/A;   CARDIOVERSION N/A 05/10/2019   Procedure: CARDIOVERSION;  Surgeon: Dolores Patty, MD;  Location: [facility] ENDOSCOPY;  Service: Cardiovascular;  Laterality: N/A;   CARDIOVERSION N/A 11/23/2020   Procedure: CARDIOVERSION;  Surgeon: Chrystie Nose, MD;  Location: [facility] ENDOSCOPY;  Service: Cardiovascular;  Laterality: N/A;   CARDIOVERSION N/A 04/16/2021   Procedure: CARDIOVERSION;  Surgeon: Thomasene Ripple, DO;  Location: [facility] ENDOSCOPY;  Service: Cardiovascular;  Laterality: N/A;   COLONOSCOPY WITH PROPOFOL N/A 12/20/2019   Procedure: COLONOSCOPY WITH PROPOFOL;  Surgeon: Benancio Deeds, MD;  Location: [facility] ENDOSCOPY;  Service: Gastroenterology;  Laterality: N/A;   POLYPECTOMY  12/20/2019   Procedure: POLYPECTOMY;  Surgeon: Benancio Deeds, MD;  Location: [facility] ENDOSCOPY;  Service: Gastroenterology;;   TEE WITHOUT CARDIOVERSION N/A 03/05/2022   Procedure: TRANSESOPHAGEAL ECHOCARDIOGRAM (TEE);  Surgeon: Maisie Fus, MD;  Location: [facility] ENDOSCOPY;  Service: Cardiovascular;  Laterality: N/A;   UMBILICAL HERNIA REPAIR  1992    Family History  Problem Relation Age of Onset   Hypertension Mother    Diabetes Father    CAD Father    Clotting disorder Father    Heart disease Father        transplant   Stomach cancer Father    Allergies Brother    Colon cancer Neg Hx    Colon polyps Neg Hx    Esophageal cancer Neg Hx    Rectal cancer Neg Hx     Social History   Socioeconomic History   Marital status: Divorced    Spouse name: Not on file   Number of children: 2   Years of education: 12   Highest education level: Not on file  Occupational History   Occupation: Drives dump truck    Comment:     Occupation: IT sales professional    Comment: Disabled  Tobacco Use   Smoking status: Former    Current packs/day: 0.00    Average packs/day: 0.3 packs/day for 1 year (0.3 ttl pk-yrs)     Types: Cigarettes    Start date: 07/23/1988    Quit date: 07/23/1989    Years since quitting: 33.6   Smokeless tobacco: Never   Tobacco comments:    Former smoker 03/06/2021  Vaping Use   Vaping status: Never Used  Substance and Sexual Activity   Alcohol use: No    Alcohol/week: 0.0 standard drinks of alcohol   Drug use: No   Sexual activity: Not Currently  Other Topics Concern   Not on file  Social History Narrative   Pt lives in [place], alone.   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  Has 2 sons in [place]      No living will   Would want sons to make decisions for him   Would accept resuscitation   Social Determinants of Health   Financial Resource Strain: Low Risk  (01/19/2018)   Overall Financial Resource Strain (CARDIA)    Difficulty of Paying Living Expenses: Not hard at all  Food Insecurity: No Food Insecurity (01/19/2018)   Hunger Vital Sign    Worried About Running Out of Food in the Last Year: Never true    Ran Out of Food in the Last Year: Never true  Transportation Needs: No Transportation Needs (01/19/2018)   PRAPARE - Administrator, Civil Service (Medical): No    Lack of Transportation (Non-Medical): No  Physical Activity: Not on file  Stress: Not on file  Social Connections: Not on file  Intimate Partner Violence: Not on file   Review of Systems No heartburn No dysphagia    Objective:   Physical Exam Constitutional:      Appearance: Normal appearance.  Eyes:     Comments: Slight muddy look to conjunctiva---not jaundiced  Cardiovascular:     Rate and Rhythm: Normal rate and regular rhythm.     Heart sounds: No murmur heard.    No gallop.  Pulmonary:     Effort: Pulmonary effort is normal.     Breath sounds: Normal breath sounds. No wheezing or rales.  Abdominal:     Palpations: Abdomen is soft.     Tenderness: There is no abdominal tenderness.  Musculoskeletal:     Cervical back: Neck supple.   Lymphadenopathy:     Cervical: No cervical adenopathy.  Neurological:     Mental Status: He is alert.            Assessment & Plan:

## 2023-02-27 NOTE — Assessment & Plan Note (Signed)
Mostly at night Could be fluid from the CPAP----no real symptoms of GERD, but this is also possible Discussed trying to elevate the head of his bed. If ongoing, would try night omeprazole for a couple of weeks to see if that helps

## 2023-03-19 ENCOUNTER — Other Ambulatory Visit: Payer: Self-pay | Admitting: Nurse Practitioner

## 2023-03-19 ENCOUNTER — Other Ambulatory Visit (HOSPITAL_COMMUNITY): Payer: Self-pay | Admitting: Physician Assistant

## 2023-03-19 DIAGNOSIS — I5042 Chronic combined systolic (congestive) and diastolic (congestive) heart failure: Secondary | ICD-10-CM

## 2023-03-19 DIAGNOSIS — J029 Acute pharyngitis, unspecified: Secondary | ICD-10-CM

## 2023-03-26 ENCOUNTER — Other Ambulatory Visit (HOSPITAL_COMMUNITY): Payer: Self-pay | Admitting: *Deleted

## 2023-03-26 ENCOUNTER — Encounter: Payer: Self-pay | Admitting: Oncology

## 2023-03-26 MED ORDER — APIXABAN 5 MG PO TABS: 5.0000 mg | ORAL_TABLET | Freq: Two times a day (BID) | ORAL | 2 refills | Status: DC

## 2023-04-02 ENCOUNTER — Ambulatory Visit (HOSPITAL_COMMUNITY)
Admission: RE | Admit: 2023-04-02 | Discharge: 2023-04-02 | Disposition: A | Discharge status: Home / Self Care | Payer: Managed Care, Other (non HMO) | Source: Ambulatory Visit | Attending: Physician Assistant | Admitting: Physician Assistant

## 2023-04-02 ENCOUNTER — Encounter: Payer: Self-pay | Admitting: Oncology

## 2023-04-02 ENCOUNTER — Encounter (HOSPITAL_COMMUNITY): Payer: Self-pay | Admitting: Physician Assistant

## 2023-04-02 VITALS — BP 148/68 | HR 63 | Ht 67.0 in | Wt 307.6 lb

## 2023-04-02 DIAGNOSIS — Z7901 Long term (current) use of anticoagulants: Secondary | ICD-10-CM | POA: Diagnosis not present

## 2023-04-02 DIAGNOSIS — I5022 Chronic systolic (congestive) heart failure: Secondary | ICD-10-CM | POA: Insufficient documentation

## 2023-04-02 DIAGNOSIS — Z5181 Encounter for therapeutic drug level monitoring: Secondary | ICD-10-CM | POA: Insufficient documentation

## 2023-04-02 DIAGNOSIS — I4819 Other persistent atrial fibrillation: Secondary | ICD-10-CM | POA: Diagnosis present

## 2023-04-02 DIAGNOSIS — G4733 Obstructive sleep apnea (adult) (pediatric): Secondary | ICD-10-CM | POA: Diagnosis not present

## 2023-04-02 DIAGNOSIS — Z6841 Body Mass Index (BMI) 40.0 and over, adult: Secondary | ICD-10-CM | POA: Insufficient documentation

## 2023-04-02 DIAGNOSIS — Z79899 Other long term (current) drug therapy: Secondary | ICD-10-CM | POA: Insufficient documentation

## 2023-04-02 DIAGNOSIS — I110 Hypertensive heart disease with heart failure: Secondary | ICD-10-CM | POA: Insufficient documentation

## 2023-04-02 DIAGNOSIS — E669 Obesity, unspecified: Secondary | ICD-10-CM | POA: Insufficient documentation

## 2023-04-02 DIAGNOSIS — D6869 Other thrombophilia: Secondary | ICD-10-CM | POA: Diagnosis not present

## 2023-04-02 LAB — COMPREHENSIVE METABOLIC PANEL
ALT: 17 U/L (ref 0–44)
AST: 19 U/L (ref 15–41)
Albumin: 3.5 g/dL (ref 3.5–5.0)
Alkaline Phosphatase: 85 U/L (ref 38–126)
Anion gap: 9 (ref 5–15)
BUN: 41 mg/dL — ABNORMAL HIGH (ref 6–20)
CO2: 28 mmol/L (ref 22–32)
Calcium: 9 mg/dL (ref 8.9–10.3)
Chloride: 105 mmol/L (ref 98–111)
Creatinine, Ser: 3.1 mg/dL — ABNORMAL HIGH (ref 0.61–1.24)
GFR, Estimated: 22 mL/min — ABNORMAL LOW (ref >60)
Glucose, Bld: 95 mg/dL (ref 70–99)
Potassium: 3.9 mmol/L (ref 3.5–5.1)
Sodium: 142 mmol/L (ref 135–145)
Total Bilirubin: 0.9 mg/dL (ref 0.0–1.2)
Total Protein: 6.2 g/dL — ABNORMAL LOW (ref 6.5–8.1)

## 2023-04-02 LAB — TSH: TSH: 2.846 u[IU]/mL (ref 0.350–4.500)

## 2023-04-02 MED ORDER — AMIODARONE HCL 200 MG PO TABS: 100.0000 mg | ORAL_TABLET | Freq: Every day | ORAL | 2 refills | Status: DC

## 2023-04-02 NOTE — Progress Notes (Signed)
 Primary Care Physician: Jimmy Charlie FERNS, MD Primary Cardiologist: Toribio Fuel, MD Electrophysiologist: Eulas FORBES Furbish, MD  Referring Physician: Dr Bensimhon    Phillip [name] is a 63 y.o. male with a history of CHFrEF, COPD, HTN, CKD, OSA, morbid obesity, atrial flutter, atrial fibrillation who presents for follow up in the [facility]. He underwent ablation of atrial flutter by Dr. Kelsie in May 2019. He has had persistent atrial fibrillation. In Feb 2021, cardioversion was attempted but failed. He was maintained on amiodarone  for a while but had recurrence and underwent an ablation by Dr. Vi[facility] in July, 2023. In follow-up in our AF clinic the following month, he was back in AF. The ablation was complicated by a large hematoma. He was seen by Dr Furbish 03/31/22, no changes at that time. Patient is on Eliquis  for a CHADS2VASC score of 2.  On follow up today, patient reports that he has done well from an afib standpoint with no episodes. He denies any bleeding issues on anticoagulation. He has noticed a 5-6 lbs weight gain over the past two months and is propping himself up on pillows at night. He has been compliant with his torsemide .   Today, he denies symptoms of palpitations, chest pain, PND, lower extremity edema, dizziness, presyncope, syncope, snoring, daytime somnolence, bleeding, or neurologic sequela. The patient is tolerating medications without difficulties and is otherwise without complaint today.    Atrial Fibrillation Risk Factors:  he does have symptoms or diagnosis of sleep apnea. he does not have a history of rheumatic fever.   Atrial Fibrillation Management history:  Previous antiarrhythmic drugs: amiodarone   Previous cardioversions: 10/14/18, 04/22/19, 05/10/19, 11/23/20, 04/16/21 Previous ablations: 07/22/17 (flutter), 09/2021 [facility])  Anticoagulation history: Xarelto , Eliquis   ROS- All systems are reviewed and  negative except as per the HPI above.   Physical Exam: BP (!) 148/68   Pulse 63   Ht 5' 7 (1.702 m)   Wt (!) 139.5 kg   BMI 48.18 kg/m   GEN: Well nourished, well developed in no acute distress NECK: No JVD; No carotid bruits CARDIAC: Regular rate and rhythm, no murmurs, rubs, gallops RESPIRATORY:  Clear to auscultation without rales, wheezing or rhonchi  ABDOMEN: Soft, non-tender, non-distended EXTREMITIES:  No edema; No deformity    Wt Readings from Last 3 Encounters:  04/02/23 (!) 139.5 kg  02/27/23 (!) 141.1 kg  02/05/23 (!) 141.3 kg     EKG today demonstrates  Vent. rate 63 BPM PR interval 180 ms QRS duration 110 ms QT/QTcB 486/497 ms   Echo 04/15/21 demonstrated   1. Left ventricular ejection fraction, by estimation, is 40 to 45%. The  left ventricle has mildly decreased function. The left ventricle  demonstrates global hypokinesis. The left ventricular internal cavity size  was moderately dilated. Left ventricular diastolic parameters are indeterminate. The average left ventricular global longitudinal strain is -6.0 %. The global longitudinal strain is abnormal.   2. Right ventricular systolic function is normal. The right ventricular  size is normal.   3. Shadowing in dome of LA cannot r/o calcified thrombus . Left atrial  size was moderately dilated.   4. The mitral valve is abnormal. Mild mitral valve regurgitation. No  evidence of mitral stenosis.   5. The aortic valve is tricuspid. There is mild calcification of the  aortic valve. There is mild thickening of the aortic valve. Aortic valve  regurgitation is mild. Aortic valve sclerosis is present, with no evidence  of aortic valve stenosis.   6. The inferior vena cava is normal in size with greater than 50%  respiratory variability, suggesting right atrial pressure of 3 mmHg.    CHA2DS2-VASc Score = 2  The patient's score is based upon: CHF History: 1 HTN History: 1 Diabetes History: 0 Stroke  History: 0 Vascular Disease History: 0 Age Score: 0 Gender Score: 0       ASSESSMENT AND PLAN: Persistent Atrial Fibrillation (ICD10:  I48.19) The patient's CHA2DS2-VASc score is 2, indicating a 2.2% annual risk of stroke.   Patient appears to be maintaining SR Continue amiodarone  100 mg daily Continue Eliquis  5 mg BID Continue carvedilol  12.5 mg BID  Secondary Hypercoagulable State (ICD10:  D68.69) The patient is at significant risk for stroke/thromboembolism based upon his CHA2DS2-VASc Score of 2.  Continue Apixaban  (Eliquis ).   High Risk Medication Monitoring (ICD 10: Z79.899) ECG intervals appear appropriate on amiodarone  Check cmet/TSH today Had CXR 06/03/22  Obesity Body mass index is 48.18 kg/m.  Encouraged lifestyle modification  OSA  Encouraged nightly CPAP  Chronic HFrEF EF 40-45% NYHA class III symptoms Patient has noticed a gradual 5-6 lbs weight gain over the past two months. He has also had to prop up on additional pillows. He is overdue for follow [facility] in the [facility]. He has occasionally taken metolazone  in the past.   HTN Mildly elevated today, better controlled at previous visits.  No changes today    Follow up with Harlene Gainer in the [facility] AF clinic in 6 months.    [name] PA-C Afib Clinic [facility] [address] [place], [place] [address] [phone]

## 2023-04-02 NOTE — Addendum Note (Signed)
 Encounter addended by: Shona Simpson, RN on: 04/02/2023 10:12 AM  Actions taken: Pharmacy for encounter modified, Order list changed

## 2023-04-13 ENCOUNTER — Observation Stay (HOSPITAL_COMMUNITY): Payer: Managed Care, Other (non HMO)

## 2023-04-13 ENCOUNTER — Emergency Department (HOSPITAL_COMMUNITY): Payer: Managed Care, Other (non HMO)

## 2023-04-13 ENCOUNTER — Encounter (HOSPITAL_COMMUNITY): Payer: Self-pay | Admitting: Internal Medicine

## 2023-04-13 ENCOUNTER — Inpatient Hospital Stay (HOSPITAL_COMMUNITY)
Admission: EM | Admit: 2023-04-13 | Discharge: 2023-04-15 | DRG: 291 | Disposition: A | Discharge status: Home / Self Care | Payer: Managed Care, Other (non HMO) | Attending: Internal Medicine | Admitting: Internal Medicine

## 2023-04-13 ENCOUNTER — Other Ambulatory Visit: Payer: Self-pay

## 2023-04-13 DIAGNOSIS — I5042 Chronic combined systolic (congestive) and diastolic (congestive) heart failure: Secondary | ICD-10-CM

## 2023-04-13 DIAGNOSIS — E876 Hypokalemia: Secondary | ICD-10-CM | POA: Diagnosis present

## 2023-04-13 DIAGNOSIS — Z1152 Encounter for screening for COVID-19: Secondary | ICD-10-CM

## 2023-04-13 DIAGNOSIS — Z86711 Personal history of pulmonary embolism: Secondary | ICD-10-CM

## 2023-04-13 DIAGNOSIS — Z8249 Family history of ischemic heart disease and other diseases of the circulatory system: Secondary | ICD-10-CM

## 2023-04-13 DIAGNOSIS — I5023 Acute on chronic systolic (congestive) heart failure: Secondary | ICD-10-CM

## 2023-04-13 DIAGNOSIS — J4489 Other specified chronic obstructive pulmonary disease: Secondary | ICD-10-CM | POA: Diagnosis present

## 2023-04-13 DIAGNOSIS — Z86718 Personal history of other venous thrombosis and embolism: Secondary | ICD-10-CM

## 2023-04-13 DIAGNOSIS — Z832 Family history of diseases of the blood and blood-forming organs and certain disorders involving the immune mechanism: Secondary | ICD-10-CM

## 2023-04-13 DIAGNOSIS — I5043 Acute on chronic combined systolic (congestive) and diastolic (congestive) heart failure: Secondary | ICD-10-CM | POA: Diagnosis not present

## 2023-04-13 DIAGNOSIS — I509 Heart failure, unspecified: Secondary | ICD-10-CM | POA: Diagnosis not present

## 2023-04-13 DIAGNOSIS — Z91018 Allergy to other foods: Secondary | ICD-10-CM

## 2023-04-13 DIAGNOSIS — Z6841 Body Mass Index (BMI) 40.0 and over, adult: Secondary | ICD-10-CM

## 2023-04-13 DIAGNOSIS — I480 Paroxysmal atrial fibrillation: Secondary | ICD-10-CM | POA: Diagnosis present

## 2023-04-13 DIAGNOSIS — I5021 Acute systolic (congestive) heart failure: Secondary | ICD-10-CM | POA: Diagnosis not present

## 2023-04-13 DIAGNOSIS — I5022 Chronic systolic (congestive) heart failure: Secondary | ICD-10-CM

## 2023-04-13 DIAGNOSIS — N1832 Chronic kidney disease, stage 3b: Secondary | ICD-10-CM | POA: Diagnosis present

## 2023-04-13 DIAGNOSIS — I130 Hypertensive heart and chronic kidney disease with heart failure and stage 1 through stage 4 chronic kidney disease, or unspecified chronic kidney disease: Secondary | ICD-10-CM | POA: Diagnosis not present

## 2023-04-13 DIAGNOSIS — Z79899 Other long term (current) drug therapy: Secondary | ICD-10-CM

## 2023-04-13 DIAGNOSIS — M109 Gout, unspecified: Secondary | ICD-10-CM | POA: Diagnosis present

## 2023-04-13 DIAGNOSIS — Z7951 Long term (current) use of inhaled steroids: Secondary | ICD-10-CM

## 2023-04-13 DIAGNOSIS — N400 Enlarged prostate without lower urinary tract symptoms: Secondary | ICD-10-CM | POA: Diagnosis present

## 2023-04-13 DIAGNOSIS — Z7901 Long term (current) use of anticoagulants: Secondary | ICD-10-CM

## 2023-04-13 DIAGNOSIS — G4733 Obstructive sleep apnea (adult) (pediatric): Secondary | ICD-10-CM | POA: Diagnosis present

## 2023-04-13 DIAGNOSIS — Z87891 Personal history of nicotine dependence: Secondary | ICD-10-CM

## 2023-04-13 DIAGNOSIS — D696 Thrombocytopenia, unspecified: Secondary | ICD-10-CM | POA: Diagnosis present

## 2023-04-13 LAB — BASIC METABOLIC PANEL
Anion gap: 7 (ref 5–15)
BUN: 45 mg/dL — ABNORMAL HIGH (ref 6–20)
CO2: 29 mmol/L (ref 22–32)
Calcium: 8.9 mg/dL (ref 8.9–10.3)
Chloride: 106 mmol/L (ref 98–111)
Creatinine, Ser: 2.92 mg/dL — ABNORMAL HIGH (ref 0.61–1.24)
GFR, Estimated: 24 mL/min — ABNORMAL LOW (ref >60)
Glucose, Bld: 83 mg/dL (ref 70–99)
Potassium: 4 mmol/L (ref 3.5–5.1)
Sodium: 142 mmol/L (ref 135–145)

## 2023-04-13 LAB — ECHOCARDIOGRAM COMPLETE
AR max vel: 2.52 cm2
AV Area VTI: 2.77 cm2
AV Area mean vel: 2.64 cm2
AV Mean grad: 8.4 mm[Hg]
AV Peak grad: 18.9 mm[Hg]
Ao pk vel: 2.17 m/s
Area-P 1/2: 2.31 cm2
Height: 69 in
S' Lateral: 6.4 cm
Single Plane A4C EF: 36.1 %
Weight: 4984.16 [oz_av]

## 2023-04-13 LAB — CBC
HCT: 38.2 % — ABNORMAL LOW (ref 39.0–52.0)
Hemoglobin: 11.5 g/dL — ABNORMAL LOW (ref 13.0–17.0)
MCH: 29.1 pg (ref 26.0–34.0)
MCHC: 30.1 g/dL (ref 30.0–36.0)
MCV: 96.7 fL (ref 80.0–100.0)
Platelets: 137 10*3/uL — ABNORMAL LOW (ref 150–400)
RBC: 3.95 MIL/uL — ABNORMAL LOW (ref 4.22–5.81)
RDW: 15.6 % — ABNORMAL HIGH (ref 11.5–15.5)
WBC: 3.7 10*3/uL — ABNORMAL LOW (ref 4.0–10.5)
nRBC: 0 % (ref 0.0–0.2)

## 2023-04-13 LAB — HEPATIC FUNCTION PANEL
ALT: 24 U/L (ref 0–44)
AST: 27 U/L (ref 15–41)
Albumin: 3.8 g/dL (ref 3.5–5.0)
Alkaline Phosphatase: 92 U/L (ref 38–126)
Bilirubin, Direct: 0.3 mg/dL — ABNORMAL HIGH (ref 0.0–0.2)
Indirect Bilirubin: 0.6 mg/dL (ref 0.3–0.9)
Total Bilirubin: 0.9 mg/dL (ref 0.0–1.2)
Total Protein: 6.7 g/dL (ref 6.5–8.1)

## 2023-04-13 LAB — TROPONIN I (HIGH SENSITIVITY)
Troponin I (High Sensitivity): 51 ng/L — ABNORMAL HIGH (ref <18)
Troponin I (High Sensitivity): 51 ng/L — ABNORMAL HIGH (ref <18)

## 2023-04-13 LAB — HIV ANTIBODY (ROUTINE TESTING W REFLEX): HIV Screen 4th Generation wRfx: NONREACTIVE

## 2023-04-13 LAB — MAGNESIUM: Magnesium: 2.1 mg/dL (ref 1.7–2.4)

## 2023-04-13 LAB — BRAIN NATRIURETIC PEPTIDE: B Natriuretic Peptide: 1814.2 pg/mL — ABNORMAL HIGH (ref 0.0–100.0)

## 2023-04-13 MED ORDER — PERFLUTREN LIPID MICROSPHERE
1.0000 mL | INTRAVENOUS | Status: AC | PRN
  Administered 2023-04-13: 3 mL via INTRAVENOUS

## 2023-04-13 MED ORDER — FUROSEMIDE 10 MG/ML IJ SOLN
120.0000 mg | Freq: Three times a day (TID) | INTRAVENOUS | Status: DC
  Administered 2023-04-13 – 2023-04-15 (×6): 120 mg via INTRAVENOUS
  Filled 2023-04-13: qty 10
  Filled 2023-04-13: qty 2
  Filled 2023-04-13 (×3): qty 12
  Filled 2023-04-13: qty 10
  Filled 2023-04-13: qty 12
  Filled 2023-04-13: qty 10

## 2023-04-13 MED ORDER — ALBUTEROL SULFATE (2.5 MG/3ML) 0.083% IN NEBU
2.5000 mg | INHALATION_SOLUTION | RESPIRATORY_TRACT | Status: DC | PRN
Start: 2023-04-13 — End: 2023-04-15

## 2023-04-13 MED ORDER — CARVEDILOL 12.5 MG PO TABS
12.5000 mg | ORAL_TABLET | Freq: Two times a day (BID) | ORAL | Status: DC
  Administered 2023-04-13 – 2023-04-15 (×4): 12.5 mg via ORAL
  Filled 2023-04-13 (×4): qty 1

## 2023-04-13 MED ORDER — FUROSEMIDE 10 MG/ML IJ SOLN: 80.0000 mg | Freq: Two times a day (BID) | INTRAMUSCULAR | Status: DC

## 2023-04-13 MED ORDER — MOMETASONE FURO-FORMOTEROL FUM 100-5 MCG/ACT IN AERO
2.0000 | INHALATION_SPRAY | Freq: Two times a day (BID) | RESPIRATORY_TRACT | Status: DC
  Administered 2023-04-13 – 2023-04-14 (×3): 2 via RESPIRATORY_TRACT
  Filled 2023-04-13: qty 8.8

## 2023-04-13 MED ORDER — ALLOPURINOL 300 MG PO TABS
300.0000 mg | ORAL_TABLET | Freq: Every day | ORAL | Status: DC
Start: 2023-04-14 — End: 2023-04-15
  Administered 2023-04-14 – 2023-04-15 (×2): 300 mg via ORAL
  Filled 2023-04-13 (×2): qty 1

## 2023-04-13 MED ORDER — AMIODARONE HCL 100 MG PO TABS
100.0000 mg | ORAL_TABLET | Freq: Every day | ORAL | Status: DC
Start: 2023-04-14 — End: 2023-04-15
  Administered 2023-04-14 – 2023-04-15 (×2): 100 mg via ORAL
  Filled 2023-04-13 (×2): qty 1

## 2023-04-13 MED ORDER — TAMSULOSIN HCL 0.4 MG PO CAPS
0.4000 mg | ORAL_CAPSULE | Freq: Every day | ORAL | Status: DC
Start: 2023-04-14 — End: 2023-04-15
  Administered 2023-04-14 – 2023-04-15 (×2): 0.4 mg via ORAL
  Filled 2023-04-13 (×2): qty 1

## 2023-04-13 MED ORDER — HYDRALAZINE HCL 50 MG PO TABS
100.0000 mg | ORAL_TABLET | Freq: Three times a day (TID) | ORAL | Status: DC
  Administered 2023-04-13 – 2023-04-15 (×7): 100 mg via ORAL
  Filled 2023-04-13 (×8): qty 2

## 2023-04-13 MED ORDER — ACETAMINOPHEN 325 MG PO TABS: 650.0000 mg | ORAL_TABLET | Freq: Four times a day (QID) | ORAL | Status: DC | PRN

## 2023-04-13 MED ORDER — ACETAMINOPHEN 650 MG RE SUPP: 650.0000 mg | Freq: Four times a day (QID) | RECTAL | Status: DC | PRN

## 2023-04-13 MED ORDER — FUROSEMIDE 10 MG/ML IJ SOLN: 80.0000 mg | Freq: Once | INTRAMUSCULAR | Status: DC

## 2023-04-13 MED ORDER — INFLUENZA VIRUS VACC SPLIT PF (FLUZONE) 0.5 ML IM SUSY
0.5000 mL | PREFILLED_SYRINGE | INTRAMUSCULAR | Status: DC
  Filled 2023-04-13: qty 0.5

## 2023-04-13 MED ORDER — ISOSORBIDE MONONITRATE ER 60 MG PO TB24
120.0000 mg | ORAL_TABLET | Freq: Every day | ORAL | Status: DC
Start: 2023-04-14 — End: 2023-04-15
  Administered 2023-04-14 – 2023-04-15 (×2): 120 mg via ORAL
  Filled 2023-04-13 (×2): qty 2

## 2023-04-13 MED ORDER — TRAZODONE HCL 50 MG PO TABS: 50.0000 mg | ORAL_TABLET | Freq: Every evening | ORAL | Status: DC | PRN

## 2023-04-13 MED ORDER — ONDANSETRON HCL 4 MG PO TABS: 4.0000 mg | ORAL_TABLET | Freq: Four times a day (QID) | ORAL | Status: DC | PRN

## 2023-04-13 MED ORDER — APIXABAN 5 MG PO TABS
5.0000 mg | ORAL_TABLET | Freq: Two times a day (BID) | ORAL | Status: DC
  Administered 2023-04-13 – 2023-04-15 (×4): 5 mg via ORAL
  Filled 2023-04-13 (×4): qty 1

## 2023-04-13 MED ORDER — ONDANSETRON HCL 4 MG/2ML IJ SOLN: 4.0000 mg | Freq: Four times a day (QID) | INTRAMUSCULAR | Status: DC | PRN

## 2023-04-13 NOTE — Progress Notes (Signed)
  Echocardiogram 2D Echocardiogram has been performed.  Leda Roys RDCS 04/13/2023, 3:27 PM

## 2023-04-13 NOTE — Consult Note (Signed)
CARDIOLOGY CONSULT NOTE       Patient ID: Phillip [name] MRN: [id number] DOB/AGE: [birth year] 63 y.o.  Admit date: 04/13/2023 Referring Physician: Kirby Crigler Primary Physician: Karie Schwalbe, MD Primary Cardiologist: Bensimohn/Mealor Reason for Consultation: CHF  Active Problems:   Acute CHF (congestive heart failure) (HCC)   Acute on chronic combined systolic and diastolic CHF (congestive heart failure) (HCC)   HPI:  63 y.o. admitted by hospitalist for acute systolic CHF and volume overload not responsive to home oral diuretic. History of Morbid obesity, COPD, HTN, CKD with baseline Cr around  3.0  PAF and OSA. He has been drinking excess fluid. Compliant with meds He is in NSR on admission Last TEE done 03/05/22 EF 40-45% No history of MR/CAD. No chest pain. CXR with CE and improved vascular congestion compared to March 2024. Troponin negative BNP chronically elevated over last year and 1814 today with  BUN 45 and Cr 2.92 Currently comfortable not requiring oxygen. History of PAF with ablation at [facility] maintaining NSR Plan per Dr Nelly Laurence was to continue amiodarone.   ROS All other systems reviewed and negative except as noted above  Past Medical History:  Diagnosis Date   Asthma    CHF (congestive heart failure) (HCC) 11/15   EF 40-45%, improved with follow-up   Chronic kidney disease    COPD (chronic obstructive pulmonary disease) (HCC)    Deep vein thrombosis (DVT) of lower extremity (HCC) 08/17/2015   DVT (deep venous thrombosis) (HCC)    Gout    Hypertension    Hypertensive cardiovascular disease    Kidney dysfunction    Morbid obesity (HCC)    OSA on CPAP    Paroxysmal atrial fibrillation (HCC)    PNA (pneumonia)    Pulmonary embolism (HCC) 2010   left leg DVT   Sleep apnea    uses cpap    Family History  Problem Relation Age of Onset   Hypertension Mother    Diabetes Father    CAD Father    Clotting disorder Father    Heart disease Father        transplant    Stomach cancer Father    Allergies Brother    Colon cancer Neg Hx    Colon polyps Neg Hx    Esophageal cancer Neg Hx    Rectal cancer Neg Hx     Social History   Socioeconomic History   Marital status: Divorced    Spouse name: Not on file   Number of children: 2   Years of education: 12   Highest education level: Not on file  Occupational History   Occupation: Drives dump truck    Comment:     Occupation: IT sales professional    Comment: Disabled  Tobacco Use   Smoking status: Former    Current packs/day: 0.00    Average packs/day: 0.3 packs/day for 1 year (0.3 ttl pk-yrs)    Types: Cigarettes    Start date: 07/23/1988    Quit date: 07/23/1989    Years since quitting: 33.7   Smokeless tobacco: Never   Tobacco comments:    Former smoker 03/06/2021  Vaping Use   Vaping status: Never Used  Substance and Sexual Activity   Alcohol use: No    Alcohol/week: 0.0 standard drinks of alcohol   Drug use: No   Sexual activity: Not Currently  Other Topics Concern   Not on file  Social History Narrative   [place] lives in [place], alone.   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   Has 2 sons in [place]      No living will   Would want sons to make decisions for him   Would accept resuscitation   Social Drivers of Health   Financial Resource Strain: Low Risk  (01/19/2018)   Overall Financial Resource Strain (CARDIA)    Difficulty of Paying Living Expenses: Not hard at all  Food Insecurity: No Food Insecurity (04/13/2023)   Hunger Vital Sign    Worried About Running Out of Food in the Last Year: Never true    Ran Out of Food in the Last Year: Never true  Transportation Needs: No Transportation Needs (04/13/2023)   PRAPARE - Administrator, Civil Service (Medical): No    Lack of Transportation (Non-Medical): No  Physical Activity: Not on file  Stress: Not on file  Social Connections: Not on file  Intimate Partner Violence: Not At Risk (04/13/2023)    Humiliation, Afraid, Rape, and Kick questionnaire    Fear of Current or Ex-Partner: No    Emotionally Abused: No    Physically Abused: No    Sexually Abused: No    Past Surgical History:  Procedure Laterality Date   A-FLUTTER ABLATION N/A 07/22/2017   Procedure: A-FLUTTER ABLATION;  Surgeon: Hillis Range, MD;  Location: [facility] INVASIVE CV LAB;  Service: Cardiovascular;  Laterality: N/A;   CARDIOVERSION N/A 10/14/2018   Procedure: CARDIOVERSION;  Surgeon: Dolores Patty, MD;  Location: [facility] ENDOSCOPY;  Service: Cardiovascular;  Laterality: N/A;   CARDIOVERSION N/A 04/22/2019   Procedure: CARDIOVERSION;  Surgeon: Vesta Mixer, MD;  Location: [facility] ENDOSCOPY;  Service: Cardiovascular;  Laterality: N/A;   CARDIOVERSION N/A 05/10/2019   Procedure: CARDIOVERSION;  Surgeon: Dolores Patty, MD;  Location: [facility] ENDOSCOPY;  Service: Cardiovascular;  Laterality: N/A;   CARDIOVERSION N/A 11/23/2020   Procedure: CARDIOVERSION;  Surgeon: Chrystie Nose, MD;  Location: [facility] ENDOSCOPY;  Service: Cardiovascular;  Laterality: N/A;   CARDIOVERSION N/A 04/16/2021   Procedure: CARDIOVERSION;  Surgeon: Thomasene Ripple, DO;  Location: [facility] ENDOSCOPY;  Service: Cardiovascular;  Laterality: N/A;   COLONOSCOPY WITH PROPOFOL N/A 12/20/2019   Procedure: COLONOSCOPY WITH PROPOFOL;  Surgeon: Benancio Deeds, MD;  Location: [facility] ENDOSCOPY;  Service: Gastroenterology;  Laterality: N/A;   POLYPECTOMY  12/20/2019   Procedure: POLYPECTOMY;  Surgeon: Benancio Deeds, MD;  Location: [facility] ENDOSCOPY;  Service: Gastroenterology;;   TEE WITHOUT CARDIOVERSION N/A 03/05/2022   Procedure: TRANSESOPHAGEAL ECHOCARDIOGRAM (TEE);  Surgeon: Maisie Fus, MD;  Location: [facility] ENDOSCOPY;  Service: Cardiovascular;  Laterality: N/A;   UMBILICAL HERNIA REPAIR  1992      Current Facility-Administered Medications:    acetaminophen (TYLENOL) tablet 650 mg, 650 mg, Oral, Q6H PRN **OR** acetaminophen (TYLENOL) suppository 650 mg, 650 mg, Rectal,  Q6H PRN, Kirby Crigler, Mir M, MD   albuterol (PROVENTIL) (2.5 MG/3ML) 0.083% nebulizer solution 2.5 mg, 2.5 mg, Nebulization, Q2H PRN, Kirby Crigler, Mir M, MD   Melene Muller ON 04/14/2023] allopurinol (ZYLOPRIM) tablet 300 mg, 300 mg, Oral, Daily, Kirby Crigler, Mir M, MD   Melene Muller ON 04/14/2023] amiodarone (PACERONE) tablet 100 mg, 100 mg, Oral, Daily, Kirby Crigler, Mir M, MD   apixaban (ELIQUIS) tablet 5 mg, 5 mg, Oral, BID, Kirby Crigler, Mir M, MD   carvedilol (COREG) tablet 12.5 mg, 12.5 mg, Oral, BID WC, Kirby Crigler, Mir M, MD   furosemide (LASIX) injection 80 mg, 80 mg, Intravenous, Once, Ali, Amjad, PA-C   furosemide (LASIX) injection 80 mg, 80 mg, Intravenous, BID,  Kirby Crigler, Mir M, MD   hydrALAZINE (APRESOLINE) tablet 100 mg, 100 mg, Oral, Q8H, Kirby Crigler, Mir M, MD   [START ON 04/14/2023] influenza vac split trivalent PF (FLULAVAL) injection 0.5 mL, 0.5 mL, Intramuscular, Tomorrow-1000, Kirby Crigler, Mir M, MD   Melene Muller ON 04/14/2023] isosorbide mononitrate (IMDUR) 24 hr tablet 120 mg, 120 mg, Oral, Daily, Kirby Crigler, Mir M, MD   ondansetron (ZOFRAN) tablet 4 mg, 4 mg, Oral, Q6H PRN **OR** ondansetron (ZOFRAN) injection 4 mg, 4 mg, Intravenous, Q6H PRN, Kirby Crigler, Mir M, MD   Melene Muller ON 04/14/2023] tamsulosin (FLOMAX) capsule 0.4 mg, 0.4 mg, Oral, Daily, Kirby Crigler, Mir M, MD   traZODone (DESYREL) tablet 50 mg, 50 mg, Oral, QHS PRN, Kirby Crigler, Mir M, MD  Melene Muller ON 04/14/2023] allopurinol  300 mg Oral Daily   [START ON 04/14/2023] amiodarone  100 mg Oral Daily   apixaban  5 mg Oral BID   carvedilol  12.5 mg Oral BID WC   furosemide  80 mg Intravenous Once   furosemide  80 mg Intravenous BID   hydrALAZINE  100 mg Oral Q8H   [START ON 04/14/2023] influenza vac split trivalent PF  0.5 mL Intramuscular Tomorrow-1000   [START ON 04/14/2023] isosorbide mononitrate  120 mg Oral Daily   [START ON 04/14/2023] tamsulosin  0.4 mg Oral Daily     Physical Exam: Blood pressure (!) 151/67, pulse (!) 55, temperature 98.3 F  (36.8 C), temperature source Oral, resp. rate 18, height 5\' 9"  (1.753 m), weight (!) 141.3 kg, SpO2 93%.    Obese black male no distress Lungs clear No murmur Abdomen benign Plus one LE edema  Labs:   Lab Results  Component Value Date   WBC 3.7 (L) 04/13/2023   HGB 11.5 (L) 04/13/2023   HCT 38.2 (L) 04/13/2023   MCV 96.7 04/13/2023   PLT 137 (L) 04/13/2023    Recent Labs  Lab 04/13/23 0727  NA 142  K 4.0  CL 106  CO2 29  BUN 45*  CREATININE 2.92*  CALCIUM 8.9  PROT 6.7  BILITOT 0.9  ALKPHOS 92  ALT 24  AST 27  GLUCOSE 83   Lab Results  Component Value Date   CKTOTAL 557 (H) 05/15/2010   CKMB (HH) 05/15/2010    16.9 CRITICAL VALUE NOTED.  VALUE IS CONSISTENT WITH PREVIOUSLY REPORTED AND CALLED VALUE.   TROPONINI 0.05 (HH) 12/20/2016    Lab Results  Component Value Date   CHOL 152 05/15/2019   CHOL 175 02/21/2015   CHOL  05/15/2010    165        ATP III CLASSIFICATION:  <200     mg/dL   Desirable  027-253  mg/dL   Borderline High  >=664    mg/dL   High          Lab Results  Component Value Date   HDL 44 05/15/2019   HDL 66 02/21/2015   HDL 62 05/15/2010   Lab Results  Component Value Date   LDLCALC 100 (H) 05/15/2019   LDLCALC 102 (H) 02/21/2015   LDLCALC  05/15/2010    91        Total Cholesterol/HDL:CHD Risk Coronary Heart Disease Risk Table                     Men   Women  1/2 Average Risk   3.4   3.3  Average Risk       5.0   4.4  2 X Average Risk   9.6  7.1  3 X Average Risk  23.4   11.0        Use the calculated Patient Ratio above and the CHD Risk Table to determine the patient's CHD Risk.        ATP III CLASSIFICATION (LDL):  <100     mg/dL   Optimal  161-096  mg/dL   Near or Above                    Optimal  130-159  mg/dL   Borderline  045-409  mg/dL   High  >811     mg/dL   Very High   Lab Results  Component Value Date   TRIG 41 05/15/2019   TRIG 37 02/21/2015   TRIG 59 05/15/2010   Lab Results  Component Value  Date   CHOLHDL 3.5 05/15/2019   CHOLHDL 2.7 02/21/2015   CHOLHDL 2.7 05/15/2010   No results found for: "LDLDIRECT"    Radiology: DG Chest 2 View Result Date: 04/13/2023 CLINICAL DATA:  Chest pain EXAM: CHEST - 2 VIEW COMPARISON:  06/03/2022 FINDINGS: Improvement in the mild perihilar vascular congestion. No focal infiltrate. Stable cardiomegaly. No effusion. Visualized bones unremarkable. IMPRESSION: Cardiomegaly with improved vascular congestion. Electronically Signed   By: Corlis Leak M.D.   On: 04/13/2023 08:41    EKG: SR LVH poor R wave progression   ASSESSMENT AND PLAN:   CHF:  Due to excess fluid intake. Mild iv lasix 120 mg q 8 hours given degree of renal dysfunction Discussed fact that CHF and renal failure don't play well together and he has to watch his fluid intake  PAF:  maintaining NSR continue amiodarone and eliquis TSH/LFTS have been normal CRF:  likely from HTN heart dx Cr near 3 at baseline per primary service  HTN: continue home meds including hydralazine OSA:  compliant with CPAP   Signed: Charlton Haws 04/13/2023, 11:20 AM

## 2023-04-13 NOTE — H&P (Signed)
History and Physical  Prabhjot Beger MWN:[id number] DOB: 02-01-1961 DOA: 04/13/2023  PCP: Karie Schwalbe, MD   Chief Complaint: Shortness of breath, leg swelling  HPI: Phillip [name] is a 63 y.o. male with medical history significant for morbid obesity, COPD on room air, history of DVT, paroxysmal atrial fibrillation on Eliquis and heart failure with reduced EF on oral diuretic being admitted to the hospital with acute on chronic heart failure with reduced EF.  States that for the last several weeks he has had waxing and waning dyspnea with exertion as well as lower extremity edema.  Typically takes torsemide 60 mg p.o. twice daily, intermittently has been taking either a third dose, or taking 80 mg of torsemide twice daily.  When he did that, he would feel like he lost a little bit of fluid but then it would come right back.  He does admit to drinking quite a bit of fluids which she knows he should not do.  He denies significant chest pain, he does have some orthopnea, states that when he walked down some stairs earlier this morning, he desaturated to 84%.  Review of Systems: Please see HPI for pertinent positives and negatives. A complete 10 system review of systems are otherwise negative.  Past Medical History:  Diagnosis Date   Asthma    CHF (congestive heart failure) (HCC) 11/15   EF 40-45%, improved with follow-up   Chronic kidney disease    COPD (chronic obstructive pulmonary disease) (HCC)    Deep vein thrombosis (DVT) of lower extremity (HCC) 08/17/2015   DVT (deep venous thrombosis) (HCC)    Gout    Hypertension    Hypertensive cardiovascular disease    Kidney dysfunction    Morbid obesity (HCC)    OSA on CPAP    Paroxysmal atrial fibrillation (HCC)    PNA (pneumonia)    Pulmonary embolism (HCC) 2010   left leg DVT   Sleep apnea    uses cpap   Past Surgical History:  Procedure Laterality Date   A-FLUTTER ABLATION N/A 07/22/2017   Procedure: A-FLUTTER ABLATION;  Surgeon: Hillis Range, MD;  Location: [facility] INVASIVE CV LAB;  Service: Cardiovascular;  Laterality: N/A;   CARDIOVERSION N/A 10/14/2018   Procedure: CARDIOVERSION;  Surgeon: Dolores Patty, MD;  Location: [facility] ENDOSCOPY;  Service: Cardiovascular;  Laterality: N/A;   CARDIOVERSION N/A 04/22/2019   Procedure: CARDIOVERSION;  Surgeon: Vesta Mixer, MD;  Location: [facility] ENDOSCOPY;  Service: Cardiovascular;  Laterality: N/A;   CARDIOVERSION N/A 05/10/2019   Procedure: CARDIOVERSION;  Surgeon: Dolores Patty, MD;  Location: [facility] ENDOSCOPY;  Service: Cardiovascular;  Laterality: N/A;   CARDIOVERSION N/A 11/23/2020   Procedure: CARDIOVERSION;  Surgeon: Chrystie Nose, MD;  Location: [facility] ENDOSCOPY;  Service: Cardiovascular;  Laterality: N/A;   CARDIOVERSION N/A 04/16/2021   Procedure: CARDIOVERSION;  Surgeon: Thomasene Ripple, DO;  Location: [facility] ENDOSCOPY;  Service: Cardiovascular;  Laterality: N/A;   COLONOSCOPY WITH PROPOFOL N/A 12/20/2019   Procedure: COLONOSCOPY WITH PROPOFOL;  Surgeon: Benancio Deeds, MD;  Location: [facility] ENDOSCOPY;  Service: Gastroenterology;  Laterality: N/A;   POLYPECTOMY  12/20/2019   Procedure: POLYPECTOMY;  Surgeon: Benancio Deeds, MD;  Location: [facility] ENDOSCOPY;  Service: Gastroenterology;;   TEE WITHOUT CARDIOVERSION N/A 03/05/2022   Procedure: TRANSESOPHAGEAL ECHOCARDIOGRAM (TEE);  Surgeon: Maisie Fus, MD;  Location: [facility] ENDOSCOPY;  Service: Cardiovascular;  Laterality: N/A;   UMBILICAL HERNIA REPAIR  1992   Social History:  reports that he quit smoking about 33 years ago.  His smoking use included cigarettes. He started smoking about 34 years ago. He has a 0.3 pack-year smoking history. He has never used smokeless tobacco. He reports that he does not drink alcohol and does not use drugs.  Allergies  Allergen Reactions   Other Anaphylaxis    mushrooms    Family History  Problem Relation Age of Onset   Hypertension Mother    Diabetes Father    CAD Father    Clotting disorder  Father    Heart disease Father        transplant   Stomach cancer Father    Allergies Brother    Colon cancer Neg Hx    Colon polyps Neg Hx    Esophageal cancer Neg Hx    Rectal cancer Neg Hx      Prior to Admission medications   Medication Sig Start Date End Date Taking? Authorizing Provider  albuterol (PROAIR HFA) 108 (90 Base) MCG/ACT inhaler Inhale 2 puffs into the lungs every 4 (four) hours as needed for wheezing or shortness of breath. 05/14/18  Yes Nyoka Cowden, MD  albuterol (PROVENTIL) (2.5 MG/3ML) 0.083% nebulizer solution INHALE 1 VIAL VIA NEBULIZER EVERY 6 HOURS AS NEEDED FOR WHEEZING/SHORTNESS OF BREATH. 02/22/21  Yes Nyoka Cowden, MD  allopurinol (ZYLOPRIM) 300 MG tablet Take 1 tablet (300 mg total) by mouth daily. 02/06/23  Yes Karie Schwalbe, MD  amiodarone (PACERONE) 200 MG tablet Take 0.5 tablets (100 mg total) by mouth daily. 04/02/23  Yes Fenton, Clint R, PA  apixaban (ELIQUIS) 5 MG TABS tablet Take 1 tablet (5 mg total) by mouth 2 (two) times daily. 03/26/23  Yes Fenton, Clint R, PA  calcium carbonate (OS-CAL - DOSED IN MG OF ELEMENTAL CALCIUM) 1250 (500 Ca) MG tablet Take 1 tablet by mouth every 3 (three) days.   Yes [provider]  carvedilol (COREG) 6.25 MG tablet Take 2 tablets (12.5 mg total) by mouth 2 (two) times daily with a meal. 02/06/23 08/05/23 Yes Karie Schwalbe, MD  Cholecalciferol (VITAMIN D3) 50 MCG (2000 UT) TABS Take 2,000 Units by mouth daily.   Yes [provider]  fluticasone (FLONASE) 50 MCG/ACT nasal spray SPRAY 2 SPRAYS INTO EACH NOSTRIL EVERY DAY Patient taking differently: Place 2 sprays into both nostrils daily as needed for allergies or rhinitis. 03/19/23  Yes Karie Schwalbe, MD  hydrALAZINE (APRESOLINE) 100 MG tablet Take 1 tablet (100 mg total) by mouth every 8 (eight) hours. 04/18/21  Yes Burnadette Pop, MD  isosorbide mononitrate (IMDUR) 120 MG 24 hr tablet Take 1 tablet (120 mg total) by mouth daily. 10/16/22  Yes  Bensimhon, Bevelyn Buckles, MD  KLOR-CON M20 20 MEQ tablet TAKE 2 TABLETS (40 MEQ) EVERY MORNING AND 1 TABLET (20 MEQ) EVERY EVENING Patient taking differently: Take 40 mEq by mouth 2 (two) times daily. 02/10/23  Yes Bensimhon, Bevelyn Buckles, MD  oxymetazoline (AFRIN NASAL SPRAY) 0.05 % nasal spray Place 1 spray into both nostrils 2 (two) times daily as needed for congestion. Patient taking differently: Place 1 spray into both nostrils daily as needed for congestion. 03/12/17  Yes Meredeth Ide, MD  tamsulosin (FLOMAX) 0.4 MG CAPS capsule Take 1 capsule (0.4 mg total) by mouth daily. 02/27/23  Yes Karie Schwalbe, MD  torsemide (DEMADEX) 20 MG tablet Take 3 tablets (60 mg total) by mouth 2 (two) times daily. 09/02/22  Yes Bensimhon, Bevelyn Buckles, MD  metolazone (ZAROXOLYN) 2.5 MG tablet Take 1 tablet (2.5 mg total) by  mouth as directed. As directed by [facility] Patient not taking: Reported on 09/29/2022 06/03/22 09/01/22  Jacklynn Ganong, FNP  SYMBICORT 80-4.5 MCG/ACT inhaler USE 2 INHALATIONS TWICE A DAY Patient not taking: Reported on 04/13/2023 12/09/21   Nyoka Cowden, MD    Physical Exam: BP (!) 176/71 (BP Location: Left Arm)   Pulse (!) 50   Temp 98.4 F (36.9 C) (Oral)   Resp 15   Ht 5\' 9"  (1.753 m)   Wt (!) 137.4 kg   SpO2 97%   BMI 44.75 kg/m  General:  Alert, oriented, calm, in no acute distress, morbidly obese, resting comfortably on room air, some mild dyspnea when speaking in full sentences.  No cough. Cardiovascular: RRR, no murmurs or rubs, he has trace pitting bilateral lower extremity edema  Respiratory: clear to auscultation bilaterally, no wheezes, no crackles  Abdomen: soft, nontender, nondistended, normal bowel tones heard  Skin: dry, no rashes  Musculoskeletal: no joint effusions, normal range of motion  Psychiatric: appropriate affect, normal speech  Neurologic: extraocular muscles intact, clear speech, moving all extremities with intact sensorium         Labs on  Admission:  Basic Metabolic Panel: Recent Labs  Lab 04/13/23 0727  NA 142  K 4.0  CL 106  CO2 29  GLUCOSE 83  BUN 45*  CREATININE 2.92*  CALCIUM 8.9  MG 2.1   Liver Function Tests: Recent Labs  Lab 04/13/23 0727  AST 27  ALT 24  ALKPHOS 92  BILITOT 0.9  PROT 6.7  ALBUMIN 3.8   No results for input(s): "LIPASE", "AMYLASE" in the last 168 hours. No results for input(s): "AMMONIA" in the last 168 hours. CBC: Recent Labs  Lab 04/13/23 0727  WBC 3.7*  HGB 11.5*  HCT 38.2*  MCV 96.7  PLT 137*   Cardiac Enzymes: No results for input(s): "CKTOTAL", "CKMB", "CKMBINDEX", "TROPONINI" in the last 168 hours. BNP (last 3 results) Recent Labs    06/03/22 1142 06/24/22 0931 04/13/23 0727  BNP 2,315.3* 1,429.1* 1,814.2*    ProBNP (last 3 results) No results for input(s): "PROBNP" in the last 8760 hours.  CBG: No results for input(s): "GLUCAP" in the last 168 hours.  Radiological Exams on Admission: DG Chest 2 View Result Date: 04/13/2023 CLINICAL DATA:  Chest pain EXAM: CHEST - 2 VIEW COMPARISON:  06/03/2022 FINDINGS: Improvement in the mild perihilar vascular congestion. No focal infiltrate. Stable cardiomegaly. No effusion. Visualized bones unremarkable. IMPRESSION: Cardiomegaly with improved vascular congestion. Electronically Signed   By: Corlis Leak M.D.   On: 04/13/2023 08:41   Assessment/Plan Nakhi Brocious is a 63 y.o. male with medical history significant for morbid obesity, COPD on room air, history of DVT, paroxysmal atrial fibrillation on Eliquis and heart failure with reduced EF on oral diuretic being admitted to the hospital with acute on chronic heart failure with reduced EF.  Acute on chronic heart failure with reduced EF-with elevated BNP, dyspnea, nonproductive cough, trace lower extremity edema.  He is not hypoxic, but increased torsemide dose has failed to improve his symptoms and he is at risk of worsening heart failure. -Observation admission -Heart  healthy diet with 1800 cc fluid restriction -IV Lasix 80 mg twice daily -Continue Coreg, Imdur -Inpatient cardiology consultation has been requested  Paroxysmal atrial fibrillation-currently in sinus rhythm, continue amiodarone and Eliquis anticoagulation  Hypertension-hydralazine 3 times daily  CKD stage III-appears to be at baseline, monitor closely with daily labs  BPH-Flomax  History of DVT-Eliquis  DVT prophylaxis: Eliquis    Code Status: Full Code  Consults called: Cardiology  Admission status: Observation  Time spent: 49 minutes  Fed Ceci Sharlette Dense MD [facility] Pager [phone]  If 7PM-7AM, please contact night-coverage [email or web address] Password [email or web address]  04/13/2023, 10:09 AM

## 2023-04-13 NOTE — ED Provider Notes (Signed)
[place] EMERGENCY DEPARTMENT AT [facility] Provider Note   CSN: [id number] Arrival date & time: 04/13/23  4098     History  Chief Complaint  Patient presents with   Chest Pain    Phillip [name] is a 63 y.o. male.  63 year old male with past medical history significant for HFrEF, A-fib on Eliquis who presents today for concern of worsening dyspnea on exertion.  He states he woke up suddenly out of his sleep while laying flat due to shortness of breath.  He states over the past 3 weeks he has had waxing and waning dyspnea as well as fluid buildup.  He states he has been adjusting his diuretic with transient results.  He states in the past few days he has gained 3 pounds.  He does endorse some vague chest pressure occasionally.  Currently without chest pain.  He states he ambulated down a flight of steps this morning and desatted to 84%.  The history is provided by the patient. No language interpreter was used.       Home Medications Prior to Admission medications   Medication Sig Start Date End Date Taking? Authorizing Provider  albuterol (PROAIR HFA) 108 (90 Base) MCG/ACT inhaler Inhale 2 puffs into the lungs every 4 (four) hours as needed for wheezing or shortness of breath. 05/14/18   Nyoka Cowden, MD  albuterol (PROVENTIL) (2.5 MG/3ML) 0.083% nebulizer solution INHALE 1 VIAL VIA NEBULIZER EVERY 6 HOURS AS NEEDED FOR WHEEZING/SHORTNESS OF BREATH. 02/22/21   Nyoka Cowden, MD  allopurinol (ZYLOPRIM) 300 MG tablet Take 1 tablet (300 mg total) by mouth daily. 02/06/23   Karie Schwalbe, MD  amiodarone (PACERONE) 200 MG tablet Take 0.5 tablets (100 mg total) by mouth daily. 04/02/23   Fenton, Clint R, PA  apixaban (ELIQUIS) 5 MG TABS tablet Take 1 tablet (5 mg total) by mouth 2 (two) times daily. 03/26/23   Fenton, Clint R, PA  calcium carbonate (OS-CAL - DOSED IN MG OF ELEMENTAL CALCIUM) 1250 (500 Ca) MG tablet Patient takes 1 tablet by mouth every 2 days.    [provider]  carvedilol (COREG) 6.25 MG tablet Take 2 tablets (12.5 mg total) by mouth 2 (two) times daily with a meal. 02/06/23 08/05/23  Karie Schwalbe, MD  Cholecalciferol (VITAMIN D3) 50 MCG (2000 UT) TABS Take 2,000 Units by mouth daily.    [provider]  cyanocobalamin (VITAMIN B12) 1000 MCG tablet Take 1 tablet (1,000 mcg total) by mouth daily. 02/08/23   [name], MD  fluticasone [facility]) 50 MCG/ACT nasal spray SPRAY 2 SPRAYS INTO EACH NOSTRIL EVERY DAY 03/19/23   Karie Schwalbe, MD  hydrALAZINE (APRESOLINE) 100 MG tablet Take 1 tablet (100 mg total) by mouth every 8 (eight) hours. 04/18/21   Burnadette Pop, MD  isosorbide mononitrate (IMDUR) 120 MG 24 hr tablet Take 1 tablet (120 mg total) by mouth daily. 10/16/22   Bensimhon, Bevelyn Buckles, MD  KLOR-CON M20 20 MEQ tablet TAKE 2 TABLETS (40 MEQ) EVERY MORNING AND 1 TABLET (20 MEQ) EVERY EVENING 02/10/23   Bensimhon, Bevelyn Buckles, MD  metolazone (ZAROXOLYN) 2.5 MG tablet Take 1 tablet (2.5 mg total) by mouth as directed. As directed by [facility] Patient not taking: Reported on 09/29/2022 06/03/22 09/01/22  Jacklynn Ganong, FNP  oxymetazoline (AFRIN NASAL SPRAY) 0.05 % nasal spray Place 1 spray into both nostrils 2 (two) times daily as needed for congestion. 03/12/17   Meredeth Ide, MD  SYMBICORT 80-4.5 MCG/ACT inhaler USE 2 INHALATIONS TWICE A DAY Patient taking differently: Inhale 2 puffs into the lungs 2 (two) times daily. 12/09/21   Nyoka Cowden, MD  tamsulosin (FLOMAX) 0.4 MG CAPS capsule Take 1 capsule (0.4 mg total) by mouth daily. 02/27/23   Karie Schwalbe, MD  torsemide (DEMADEX) 20 MG tablet Take 3 tablets (60 mg total) by mouth 2 (two) times daily. 09/02/22   Bensimhon, Bevelyn Buckles, MD      Allergies    Other    Review of Systems   Review of Systems  Constitutional:  Negative for chills and fever.  Respiratory:  Positive for shortness of breath.   Cardiovascular:  Positive for chest pain and leg  swelling. Negative for palpitations.  Neurological:  Negative for light-headedness.  All other systems reviewed and are negative.   Physical Exam Updated Vital Signs BP (!) 138/95 (BP Location: Left Arm)   Pulse 74   Temp 98.4 F (36.9 C) (Oral)   Resp 18   Ht 5\' 9"  (1.753 m)   Wt (!) 137.4 kg   SpO2 90%   BMI 44.75 kg/m  Physical Exam Vitals and nursing note reviewed.  Constitutional:      General: He is not in acute distress.    Appearance: Normal appearance. He is not ill-appearing.  HENT:     Head: Normocephalic and atraumatic.     Nose: Nose normal.  Eyes:     General: No scleral icterus.    Extraocular Movements: Extraocular movements intact.     Conjunctiva/sclera: Conjunctivae normal.  Cardiovascular:     Rate and Rhythm: Normal rate and regular rhythm.     Heart sounds: Normal heart sounds.  Pulmonary:     Effort: Pulmonary effort is normal. No respiratory distress.     Breath sounds: Normal breath sounds. No wheezing.  Abdominal:     General: There is no distension.     Palpations: Abdomen is soft.     Tenderness: There is no abdominal tenderness. There is no guarding.  Musculoskeletal:        General: Normal range of motion.     Cervical back: Normal range of motion.     Right lower leg: Edema (trace pitting edema) present.     Left lower leg: Edema (trace pitting edema) present.  Skin:    General: Skin is warm and dry.  Neurological:     General: No focal deficit present.     Mental Status: He is alert. Mental status is at baseline.     ED Results / Procedures / Treatments   Labs (all labs ordered are listed, but only abnormal results are displayed) Labs Reviewed  BASIC METABOLIC PANEL - Abnormal; Notable for the following components:      Result Value   BUN 45 (*)    Creatinine, Ser 2.92 (*)    GFR, Estimated 24 (*)    All other components within normal limits  CBC - Abnormal; Notable for the following components:   WBC 3.7 (*)    RBC 3.95  (*)    Hemoglobin 11.5 (*)    HCT 38.2 (*)    RDW 15.6 (*)    Platelets 137 (*)    All other components within normal limits  TROPONIN I (HIGH SENSITIVITY) - Abnormal; Notable for the following components:   Troponin I (High Sensitivity) 51 (*)    All other components within normal limits  BRAIN NATRIURETIC PEPTIDE  HEPATIC FUNCTION PANEL  MAGNESIUM  EKG EKG Interpretation Date/Time:  Monday April 13 2023 07:32:05 EST Ventricular Rate:  64 PR Interval:  168 QRS Duration:  116 QT Interval:  486 QTC Calculation: 502 R Axis:   -32  Text Interpretation: Sinus rhythm LVH with secondary repolarization abnormality Prolonged QT interval No significant change since last tracing Confirmed by Gwyneth Sprout ([id number]) on 04/13/2023 7:51:14 AM  Radiology No results found.  Procedures Procedures    Medications Ordered in ED Medications - No data to display  ED Course/ Medical Decision Making/ A&P                                 Medical Decision Making Amount and/or Complexity of Data Reviewed Labs: ordered. Radiology: ordered.  Risk Prescription drug management. Decision regarding hospitalization.   Medical Decision Making / ED Course   This patient presents to the ED for concern of shortness of breath, chest pain, this involves an extensive number of treatment options, and is a complaint that carries with it a high risk of complications and morbidity.  The differential diagnosis includes CHF exacerbation, ACS, pneumonia, PE, MSK etiology  MDM: 63 year old male with past medical history significant for congestive heart failure presents today for concern of volume overload, shortness of breath.  He states he suddenly woke up while he was laying on his back out of his sleep because he was short of breath.  He had his CPAP on and he states he had to suddenly take that off.  He states currently he weighs 303.  Recently weighed himself this morning.  He is 3 pounds over his  recent baseline.  Reports compliance with his home medications including torsemide 60 mg twice daily.  No current chest pain.  He states occasionally he has some chest pressure.  CBC without leukocytosis.  Mild anemia but around his baseline.  BMP with creatinine of 2.92 which is around his baseline.  No other acute concern.  Hepatic function panel without acute concern.  Troponin mildly elevated but around his baseline.  Will continue to trend.  BNP of 1814.  This is above his baseline.  Magnesium 2.1.  Chest x-ray does show cardiomegaly with mild vascular congestion.  EKG without acute ischemic change.  Low suspicion for ACS.  Discussed with hospitalist.  They will evaluate patient for admission.  Lasix IVP of 80 mg ordered.   Lab Tests: -I ordered, reviewed, and interpreted labs.   The pertinent results include:   Labs Reviewed  BASIC METABOLIC PANEL - Abnormal; Notable for the following components:      Result Value   BUN 45 (*)    Creatinine, Ser 2.92 (*)    GFR, Estimated 24 (*)    All other components within normal limits  CBC - Abnormal; Notable for the following components:   WBC 3.7 (*)    RBC 3.95 (*)    Hemoglobin 11.5 (*)    HCT 38.2 (*)    RDW 15.6 (*)    Platelets 137 (*)    All other components within normal limits  HEPATIC FUNCTION PANEL - Abnormal; Notable for the following components:   Bilirubin, Direct 0.3 (*)    All other components within normal limits  TROPONIN I (HIGH SENSITIVITY) - Abnormal; Notable for the following components:   Troponin I (High Sensitivity) 51 (*)    All other components within normal limits  MAGNESIUM  BRAIN NATRIURETIC PEPTIDE  EKG  EKG Interpretation Date/Time:  Monday April 13 2023 07:32:05 EST Ventricular Rate:  64 PR Interval:  168 QRS Duration:  116 QT Interval:  486 QTC Calculation: 502 R Axis:   -32  Text Interpretation: Sinus rhythm LVH with secondary repolarization abnormality Prolonged QT interval No  significant change since last tracing Confirmed by Gwyneth Sprout ([id number]) on 04/13/2023 7:51:14 AM         Imaging Studies ordered: I ordered imaging studies including cxr I independently visualized and interpreted imaging. I agree with the radiologist interpretation   Medicines ordered and prescription drug management: No orders of the defined types were placed in this encounter.   -I have reviewed the patients home medicines and have made adjustments as needed   Cardiac Monitoring: The patient was maintained on a cardiac monitor.  I personally viewed and interpreted the cardiac monitored which showed an underlying rhythm of: NSR   Reevaluation: After the interventions noted above, I reevaluated the patient and found that they have :stayed the same  Co morbidities that complicate the patient evaluation  Past Medical History:  Diagnosis Date   Asthma    CHF (congestive heart failure) (HCC) 11/15   EF 40-45%, improved with follow-up   Chronic kidney disease    COPD (chronic obstructive pulmonary disease) (HCC)    Deep vein thrombosis (DVT) of lower extremity (HCC) 08/17/2015   DVT (deep venous thrombosis) (HCC)    Gout    Hypertension    Hypertensive cardiovascular disease    Kidney dysfunction    Morbid obesity (HCC)    OSA on CPAP    Paroxysmal atrial fibrillation (HCC)    PNA (pneumonia)    Pulmonary embolism (HCC) 2010   left leg DVT   Sleep apnea    uses cpap      Dispostion: Discussed with hospitalist.  They will evaluate patient for admission.   Final Clinical Impression(s) / ED Diagnoses Final diagnoses:  Acute on chronic congestive heart failure, unspecified heart failure type [facility])    Rx / DC Orders ED Discharge Orders     None         Marita Kansas, PA-C 04/13/23 1001    Gwyneth Sprout, MD 04/13/23 1524

## 2023-04-13 NOTE — ED Notes (Signed)
Pt steady on feet, ambulated pt with o2 started at 99% as we proceed to walk up the hall pt o2 levels was dropping slowly pt o2 range from 99% down to 94. Pt not in room in bed resting.

## 2023-04-13 NOTE — ED Triage Notes (Signed)
Patient to ED by POV with c/o chest pressure and bilateral leg swelling. States he has HX of enlarged heart takes Torsemide 60mg  in the morning and 60mg  in the evening, took meds today. Reports dry cough.

## 2023-04-14 DIAGNOSIS — Z8249 Family history of ischemic heart disease and other diseases of the circulatory system: Secondary | ICD-10-CM | POA: Diagnosis not present

## 2023-04-14 DIAGNOSIS — J4489 Other specified chronic obstructive pulmonary disease: Secondary | ICD-10-CM | POA: Diagnosis present

## 2023-04-14 DIAGNOSIS — D696 Thrombocytopenia, unspecified: Secondary | ICD-10-CM | POA: Diagnosis present

## 2023-04-14 DIAGNOSIS — Z79899 Other long term (current) drug therapy: Secondary | ICD-10-CM | POA: Diagnosis not present

## 2023-04-14 DIAGNOSIS — N1832 Chronic kidney disease, stage 3b: Secondary | ICD-10-CM | POA: Diagnosis present

## 2023-04-14 DIAGNOSIS — Z86718 Personal history of other venous thrombosis and embolism: Secondary | ICD-10-CM | POA: Diagnosis not present

## 2023-04-14 DIAGNOSIS — M109 Gout, unspecified: Secondary | ICD-10-CM | POA: Diagnosis present

## 2023-04-14 DIAGNOSIS — I480 Paroxysmal atrial fibrillation: Secondary | ICD-10-CM | POA: Diagnosis present

## 2023-04-14 DIAGNOSIS — I420 Dilated cardiomyopathy: Secondary | ICD-10-CM | POA: Diagnosis not present

## 2023-04-14 DIAGNOSIS — Z7951 Long term (current) use of inhaled steroids: Secondary | ICD-10-CM | POA: Diagnosis not present

## 2023-04-14 DIAGNOSIS — I5043 Acute on chronic combined systolic (congestive) and diastolic (congestive) heart failure: Secondary | ICD-10-CM | POA: Diagnosis present

## 2023-04-14 DIAGNOSIS — I130 Hypertensive heart and chronic kidney disease with heart failure and stage 1 through stage 4 chronic kidney disease, or unspecified chronic kidney disease: Secondary | ICD-10-CM | POA: Diagnosis present

## 2023-04-14 DIAGNOSIS — G4733 Obstructive sleep apnea (adult) (pediatric): Secondary | ICD-10-CM | POA: Diagnosis present

## 2023-04-14 DIAGNOSIS — Z91018 Allergy to other foods: Secondary | ICD-10-CM | POA: Diagnosis not present

## 2023-04-14 DIAGNOSIS — Z87891 Personal history of nicotine dependence: Secondary | ICD-10-CM | POA: Diagnosis not present

## 2023-04-14 DIAGNOSIS — Z6841 Body Mass Index (BMI) 40.0 and over, adult: Secondary | ICD-10-CM | POA: Diagnosis not present

## 2023-04-14 DIAGNOSIS — I5023 Acute on chronic systolic (congestive) heart failure: Secondary | ICD-10-CM | POA: Diagnosis present

## 2023-04-14 DIAGNOSIS — Z86711 Personal history of pulmonary embolism: Secondary | ICD-10-CM | POA: Diagnosis not present

## 2023-04-14 DIAGNOSIS — Z832 Family history of diseases of the blood and blood-forming organs and certain disorders involving the immune mechanism: Secondary | ICD-10-CM | POA: Diagnosis not present

## 2023-04-14 DIAGNOSIS — E876 Hypokalemia: Secondary | ICD-10-CM | POA: Diagnosis present

## 2023-04-14 DIAGNOSIS — N400 Enlarged prostate without lower urinary tract symptoms: Secondary | ICD-10-CM | POA: Diagnosis present

## 2023-04-14 DIAGNOSIS — Z1152 Encounter for screening for COVID-19: Secondary | ICD-10-CM | POA: Diagnosis not present

## 2023-04-14 DIAGNOSIS — Z7901 Long term (current) use of anticoagulants: Secondary | ICD-10-CM | POA: Diagnosis not present

## 2023-04-14 DIAGNOSIS — I509 Heart failure, unspecified: Secondary | ICD-10-CM | POA: Diagnosis present

## 2023-04-14 LAB — BASIC METABOLIC PANEL
Anion gap: 8 (ref 5–15)
BUN: 49 mg/dL — ABNORMAL HIGH (ref 6–20)
CO2: 29 mmol/L (ref 22–32)
Calcium: 8.8 mg/dL — ABNORMAL LOW (ref 8.9–10.3)
Chloride: 105 mmol/L (ref 98–111)
Creatinine, Ser: 3.02 mg/dL — ABNORMAL HIGH (ref 0.61–1.24)
GFR, Estimated: 23 mL/min — ABNORMAL LOW (ref >60)
Glucose, Bld: 84 mg/dL (ref 70–99)
Potassium: 3.3 mmol/L — ABNORMAL LOW (ref 3.5–5.1)
Sodium: 142 mmol/L (ref 135–145)

## 2023-04-14 LAB — CBC
HCT: 34 % — ABNORMAL LOW (ref 39.0–52.0)
Hemoglobin: 10.5 g/dL — ABNORMAL LOW (ref 13.0–17.0)
MCH: 29.2 pg (ref 26.0–34.0)
MCHC: 30.9 g/dL (ref 30.0–36.0)
MCV: 94.4 fL (ref 80.0–100.0)
Platelets: 113 10*3/uL — ABNORMAL LOW (ref 150–400)
RBC: 3.6 MIL/uL — ABNORMAL LOW (ref 4.22–5.81)
RDW: 15.1 % (ref 11.5–15.5)
WBC: 3.1 10*3/uL — ABNORMAL LOW (ref 4.0–10.5)
nRBC: 0 % (ref 0.0–0.2)

## 2023-04-14 MED ORDER — POTASSIUM CHLORIDE CRYS ER 20 MEQ PO TBCR
40.0000 meq | EXTENDED_RELEASE_TABLET | Freq: Once | ORAL | Status: AC
  Administered 2023-04-14: 40 meq via ORAL
  Filled 2023-04-14: qty 2

## 2023-04-14 NOTE — Progress Notes (Signed)
   04/14/23 0848  TOC Brief Assessment  Insurance and Status Reviewed  Patient has primary care physician Yes  Home environment has been reviewed Resides in single family home  Prior level of function: Independent with ADLs at baseline  Prior/Current Home Services No current home services  Social Drivers of Health Review SDOH reviewed no interventions necessary  Readmission risk has been reviewed Yes  Transition of care needs no transition of care needs at this time

## 2023-04-14 NOTE — Progress Notes (Signed)
Heart Failure Navigator Progress Note  Assessed for Heart & Vascular TOC clinic readiness.  Patient does not meet criteria due to Advanced Heart Failure Team patient of Dr. Bensimhon.   Navigator will sign off at this time.   Lucilia Yanni, BSN, RN Heart Failure Nurse Navigator Secure Chat Only   

## 2023-04-14 NOTE — Progress Notes (Signed)
[facility] PROGRESS NOTE   [name]:[id number] DOB: 06/14/1960 DOA: 04/13/2023  PCP: Karie Schwalbe, MD  Brief History: 63 y.o. male with medical history significant for morbid obesity, COPD on room air, history of DVT, paroxysmal atrial fibrillation on Eliquis and heart failure with reduced EF on oral diuretic being admitted to the hospital with acute on chronic heart failure with reduced EF.  Symptoms have been ongoing for about 3 weeks despite compliance with diuretics.  He even took an extra dose of torsemide for a few days.  Consultants: Cardiology  Procedures: Echocardiogram    Subjective/Interval History: Patient denies any chest pain.  Shortness of breath has improved.  Feels the swelling is coming down.  Has urinated a lot.  Denies any nausea or vomiting.    Assessment/Plan:  Acute on chronic systolic CHF Echocardiogram from December 2023 showed EF of 40 to 45%.  Global hypokinesis was noted.  Echocardiogram repeated during this admission shows EF of 35 to 40%.  Again global hypokinesis is noted.  Grade 2 diastolic dysfunction is also noted. Patient has been seen by cardiology.  Patient is on high-dose furosemide.  He is on carvedilol.  On nitrates and hydralazine.  Due to elevated creatinine he is not a candidate for ACE inhibitor or ARB or Entresto.  Next line monitor ins and outs Daily weights.  Seems to be improving.  Renal function is stable.  Supplement potassium.  Chronic kidney disease stage IIIb/hypokalemia Mild increase in creatinine noted but still close to his baseline.[name]  He is followed by Dr. Thedore Mins with nephrology in the outpatient setting.  Monitor labs on a daily basis while he is getting diuresed.  History of paroxysmal atrial fibrillation Noted to be on amiodarone.  On carvedilol.  Continue with Eliquis.  Normocytic anemia/mild thrombocytopenia Monitor counts closely.  History of gout Continue with allopurinol.  History of  BPH Continue Flomax.  Morbid obesity Estimated body mass index is 46 kg/m as calculated from the following:   Height as of this encounter: 5\' 9"  (1.753 m).   Weight as of this encounter: 141.3 kg.   DVT Prophylaxis: Apixaban Code Status: Full code Family Communication: Discussed with patient Disposition Plan: Hopefully return home when improved  Status is: Observation The patient will require care spanning > 2 midnights and should be moved to inpatient because: Acute CHF requiring IV diuretics    Medications: Scheduled:  allopurinol  300 mg Oral Daily   amiodarone  100 mg Oral Daily   apixaban  5 mg Oral BID   carvedilol  12.5 mg Oral BID WC   hydrALAZINE  100 mg Oral Q8H   influenza vac split trivalent PF  0.5 mL Intramuscular Tomorrow-1000   isosorbide mononitrate  120 mg Oral Daily   mometasone-formoterol  2 puff Inhalation BID   potassium chloride  40 mEq Oral Once   tamsulosin  0.4 mg Oral Daily   Continuous:  furosemide 120 mg (04/14/23 0528)   WJX:BJYNWGNFAOZHY **OR** acetaminophen, albuterol, ondansetron **OR** ondansetron (ZOFRAN) IV, traZODone     Objective:  Vital Signs  Vitals:   04/14/23 0243 04/14/23 0437 04/14/23 0750 04/14/23 0907  BP: (!) 147/53 (!) 155/58 135/76   Pulse: (!) 56 (!) 56 76   Resp: 19 14 (!) 23   Temp: (!) 97.4 F (36.3 C) 98.3 F (36.8 C) 97.9 F (36.6 C)   TempSrc: Oral Oral Oral   SpO2: 99% 91% 95% 94%  Weight:      Height:  Intake/Output Summary (Last 24 hours) at 04/14/2023 0947 Last data filed at 04/14/2023 0830 Gross per 24 hour  Intake 1266.27 ml  Output 4300 ml  Net -3033.73 ml   Filed Weights   04/13/23 0729 04/13/23 1110  Weight: (!) 137.4 kg (!) 141.3 kg    General appearance: Awake alert.  In no distress Resp: Normal effort at rest.  Few crackles at the bases. Cardio: S1-S2 is normal regular.  No S3-S4.  No rubs murmurs or bruit GI: Abdomen is soft.  Nontender nondistended.  Bowel sounds are  present normal.  No masses organomegaly Extremities: 1+ edema bilateral lower extremities Neurologic: Alert and oriented x3.  No focal neurological deficits.    Lab Results:  Data Reviewed: I have personally reviewed following labs and reports of the imaging studies  CBC: Recent Labs  Lab 04/13/23 0727 04/14/23 0358  WBC 3.7* 3.1*  HGB 11.5* 10.5*  HCT 38.2* 34.0*  MCV 96.7 94.4  PLT 137* 113*    Basic Metabolic Panel: Recent Labs  Lab 04/13/23 0727 04/14/23 0358  NA 142 142  K 4.0 3.3*  CL 106 105  CO2 29 29  GLUCOSE 83 84  BUN 45* 49*  CREATININE 2.92* 3.02*  CALCIUM 8.9 8.8*  MG 2.1  --     GFR: Estimated Creatinine Clearance: 36.4 mL/min (A) (by C-G formula based on SCr of 3.02 mg/dL (H)).  Liver Function Tests: Recent Labs  Lab 04/13/23 0727  AST 27  ALT 24  ALKPHOS 92  BILITOT 0.9  PROT 6.7  ALBUMIN 3.8     Radiology Studies: ECHOCARDIOGRAM COMPLETE Result Date: 04/13/2023    ECHOCARDIOGRAM REPORT   Patient Name:   Phillip [name] Date of Exam: 04/13/2023 Medical Rec #:  [id number]  Height:       69.0 in Accession #:    [id number] Weight:       311.5 lb Date of Birth:  17-Nov-1960   BSA:          2.494 m Patient Age:    62 years   BP:           177/66 mmHg Patient Gender: M          HR:           55 bpm. Exam Location:  Inpatient Procedure: 2D Echo, Color Doppler, Cardiac Doppler and Intracardiac            Opacification Agent Indications:    CHF I50.9  History:        Patient has prior history of Echocardiogram examinations, most                 recent 04/15/2021. CHF, COPD; Risk Factors:Hypertension.  Sonographer:    Harriette Bouillon RDCS Referring Phys: [id number] SHENG L HALEY IMPRESSIONS  1. Left ventricular ejection fraction, by estimation, is 35 to 40%. The left ventricle has moderately decreased function. The left ventricle demonstrates global hypokinesis. The left ventricular internal cavity size was severely dilated. There is mild left ventricular hypertrophy.  Left ventricular diastolic parameters are consistent with Grade II diastolic dysfunction (pseudonormalization).  2. Right ventricular systolic function is normal. The right ventricular size is normal. Tricuspid regurgitation signal is inadequate for assessing PA pressure.  3. Left atrial size was severely dilated.  4. The mitral valve is grossly normal. Trivial mitral valve regurgitation. No evidence of mitral stenosis.  5. Moderate eccentric AI. The aortic valve is tricuspid. Aortic valve regurgitation is moderate. No aortic stenosis is present.  6. Aortic dilatation noted. There is borderline dilatation of the ascending aorta, measuring 40 mm.  7. The inferior vena cava is dilated in size with <50% respiratory variability, suggesting right atrial pressure of 15 mmHg. FINDINGS  Left Ventricle: Left ventricular ejection fraction, by estimation, is 35 to 40%. The left ventricle has moderately decreased function. The left ventricle demonstrates global hypokinesis. The left ventricular internal cavity size was severely dilated. There is mild left ventricular hypertrophy. Left ventricular diastolic parameters are consistent with Grade II diastolic dysfunction (pseudonormalization). Right Ventricle: The right ventricular size is normal. Right vetricular wall thickness was not well visualized. Right ventricular systolic function is normal. Tricuspid regurgitation signal is inadequate for assessing PA pressure. Left Atrium: Left atrial size was severely dilated. Right Atrium: Right atrial size was normal in size. Pericardium: There is no evidence of pericardial effusion. Mitral Valve: The mitral valve is grossly normal. Trivial mitral valve regurgitation. No evidence of mitral valve stenosis. Tricuspid Valve: The tricuspid valve is normal in structure. Tricuspid valve regurgitation is trivial. No evidence of tricuspid stenosis. Aortic Valve: Moderate eccentric AI. The aortic valve is tricuspid. Aortic valve regurgitation  is moderate. No aortic stenosis is present. Aortic valve mean gradient measures 8.4 mmHg. Aortic valve peak gradient measures 18.9 mmHg. Aortic valve area, by VTI measures 2.77 cm. Pulmonic Valve: The pulmonic valve was not well visualized. Pulmonic valve regurgitation is trivial. No evidence of pulmonic stenosis. Aorta: Aortic dilatation noted and the aortic root is normal in size and structure. There is borderline dilatation of the ascending aorta, measuring 40 mm. Venous: The inferior vena cava is dilated in size with less than 50% respiratory variability, suggesting right atrial pressure of 15 mmHg. IAS/Shunts: The interatrial septum was not well visualized.  LEFT VENTRICLE PLAX 2D LVIDd:         7.60 cm      Diastology LVIDs:         6.40 cm      LV e' medial:    5.00 cm/s LV PW:         1.30 cm      LV E/e' medial:  20.8 LV IVS:        1.30 cm      LV e' lateral:   7.83 cm/s LVOT diam:     2.46 cm      LV E/e' lateral: 13.3 LV SV:         116 LV SV Index:   47 LVOT Area:     4.75 cm  LV Volumes (MOD) LV vol d, MOD A4C: 402.0 ml LV vol s, MOD A4C: 257.0 ml LV SV MOD A4C:     402.0 ml RIGHT VENTRICLE         IVC TAPSE (M-mode): 2.7 cm  IVC diam: 2.90 cm LEFT ATRIUM              Index LA diam:        5.60 cm  2.25 cm/m LA Vol (A2C):   180.0 ml 72.17 ml/m LA Vol (A4C):   135.0 ml 54.13 ml/m LA Biplane Vol: 160.0 ml 64.15 ml/m  AORTIC VALVE AV Area (Vmax):    2.52 cm AV Area (Vmean):   2.64 cm AV Area (VTI):     2.77 cm AV Vmax:           217.18 cm/s AV Vmean:          132.793 cm/s AV VTI:  0.421 m AV Peak Grad:      18.9 mmHg AV Mean Grad:      8.4 mmHg LVOT Vmax:         115.00 cm/s LVOT Vmean:        73.700 cm/s LVOT VTI:          0.245 m LVOT/AV VTI ratio: 0.58  AORTA Ao Root diam: 3.60 cm Ao Asc diam:  4.00 cm MITRAL VALVE MV Area (PHT): 2.31 cm     SHUNTS MV Decel Time: 329 msec     Systemic VTI:  0.24 m MV E velocity: 104.00 cm/s  Systemic Diam: 2.46 cm MV A velocity: 56.30 cm/s MV E/A  ratio:  1.85 Weston Brass MD Electronically signed by Weston Brass MD Signature Date/Time: 04/13/2023/10:42:01 PM    Final    DG Chest 2 View Result Date: 04/13/2023 CLINICAL DATA:  Chest pain EXAM: CHEST - 2 VIEW COMPARISON:  06/03/2022 FINDINGS: Improvement in the mild perihilar vascular congestion. No focal infiltrate. Stable cardiomegaly. No effusion. Visualized bones unremarkable. IMPRESSION: Cardiomegaly with improved vascular congestion. Electronically Signed   By: Corlis Leak M.D.   On: 04/13/2023 08:41       LOS: 0 days   Tara Wich Rito Ehrlich  [facility] Pager on [email or web address]  04/14/2023, 9:47 AM

## 2023-04-14 NOTE — Progress Notes (Signed)
Pt will self administer CPAP.  Pt is aware if he has any issues to have Rn call RT.

## 2023-04-14 NOTE — Progress Notes (Deleted)
Subjective:     Patient ID: Phillip [name], male   DOB: 09/27/1960,     MRN: [id number]   Brief patient profile:  62  yobm no significant  smoking hx / played LB at semi pro level  at wt 240 and maintained that wt of < 260 up until retired from [employer] in 1999 with smoke exp in [place] at "79% lung capacity" per pulmonologist on prn saba with progressive wt gain since then assoc also with progessive doe so self referred to pulmonary clinic 07/24/2015        History of Present Illness  09/30/2016  Acute ov/Phillip [name] re: chronic asthma  symbicort 80  2bid / 02 2lpm hs  Chief Complaint  Patient presents with   Acute Visit    Pt states every morning he hears a rattling in his chest, still having the sob while walking, still has an occ. cough but mainly non productive, only has chest tightness when he exerts himself, he states he does has some chest conegstion Denies fever, pt unsure if his recent [place] has anything to do with his symtpoms   worse x 3 weeks but better since in ER with dx with chf 09/15/16 and sleeping fine on cpap  Much better p lasix Some better after saba but mostly using neb and skipping hfa saba in action paln  Minimal rattling better p extra dose of diuretic rec Plan A = Automatic = Symbicort 80 Take 2 puffs first thing in am and then another 2 puffs about 12 hours later.  Plan B = Backup Only use your albuterol as a rescue medication to be used if you can't catch your breath Plan C = Crisis - only use your albuterol nebulizer if you first try Plan B      04/15/2023  f/u ov/Phillip [name] re: AB   maint on ***  No chief complaint on file.   Dyspnea:  *** Cough: *** Sleeping: *** resp cc  SABA use: *** 02: ***  Lung cancer screening :  ***    No obvious day to day or daytime variability or assoc excess/ purulent sputum or mucus plugs or hemoptysis or cp or chest tightness, subjective wheeze or overt sinus or hb symptoms.    Also denies any obvious fluctuation of  symptoms with weather or environmental changes or other aggravating or alleviating factors except as outlined above   No unusual exposure hx or h/o childhood pna/ asthma or knowledge of premature birth.  Current Allergies, Complete Past Medical History, Past Surgical History, Family History, and Social History were reviewed in [employer] record.  ROS  The following are not active complaints unless bolded Hoarseness, sore throat, dysphagia, dental problems, itching, sneezing,  nasal congestion or discharge of excess mucus or purulent secretions, ear ache,   fever, chills, sweats, unintended wt loss or wt gain, classically pleuritic or exertional cp,  orthopnea pnd or arm/hand swelling  or leg swelling, presyncope, palpitations, abdominal pain, anorexia, nausea, vomiting, diarrhea  or change in bowel habits or change in bladder habits, change in stools or change in urine, dysuria, hematuria,  rash, arthralgias, visual complaints, headache, numbness, weakness or ataxia or problems with walking or coordination,  change in mood or  memory.        No outpatient medications have been marked as taking for the 04/15/23 encounter (Appointment) with Nyoka Cowden, MD.           Objective:  Physical Exam  Wts  04/15/2023           ***  03/28/2021            319   10/07/2018          374   05/14/2018         382  12/17/2017          379  06/16/2017          374  03/09/2017      376  12/16/2016        372  09/30/2016        358  07/21/2016        362  04/22/2016        374  01/21/2016      390  12/27/2015        387   09/10/2015       372   07/24/15 389 lb 6.4 oz (176.631 kg)  07/08/15 384 lb 3.2 oz (174.272 kg)  06/21/15 390 lb 8 oz (177.13 kg)       Vital signs reviewed  04/15/2023  - Note at rest 02 sats  ***% on ***   General appearance:    ***  L > R pitting edema both LEs***    Assessment:

## 2023-04-14 NOTE — Progress Notes (Signed)
Subjective:  Denies SSCP, palpitations or Dyspnea Good diuresis   Objective:  Vitals:   04/14/23 0243 04/14/23 0437 04/14/23 0750 04/14/23 0907  BP: (!) 147/53 (!) 155/58 135/76   Pulse: (!) 56 (!) 56 76   Resp: 19 14 (!) 23   Temp: (!) 97.4 F (36.3 C) 98.3 F (36.8 C) 97.9 F (36.6 C)   TempSrc: Oral Oral Oral   SpO2: 99% 91% 95% 94%  Weight:      Height:        Intake/Output from previous day:  Intake/Output Summary (Last 24 hours) at 04/14/2023 1033 Last data filed at 04/14/2023 0830 Gross per 24 hour  Intake 1266.27 ml  Output 4300 ml  Net -3033.73 ml    Physical Exam: Obese black male Lungs clear No murmur Abdomen benign Plus one bilateral edema  Lab Results: Basic Metabolic Panel: Recent Labs    04/13/23 0727 04/14/23 0358  NA 142 142  K 4.0 3.3*  CL 106 105  CO2 29 29  GLUCOSE 83 84  BUN 45* 49*  CREATININE 2.92* 3.02*  CALCIUM 8.9 8.8*  MG 2.1  --    Liver Function Tests: Recent Labs    04/13/23 0727  AST 27  ALT 24  ALKPHOS 92  BILITOT 0.9  PROT 6.7  ALBUMIN 3.8   CBC: Recent Labs    04/13/23 0727 04/14/23 0358  WBC 3.7* 3.1*  HGB 11.5* 10.5*  HCT 38.2* 34.0*  MCV 96.7 94.4  PLT 137* 113*    Imaging: ECHOCARDIOGRAM COMPLETE Result Date: 04/13/2023    ECHOCARDIOGRAM REPORT   Patient Name:   Phillip [name] Date of Exam: 04/13/2023 Medical Rec #:  [id number]  Height:       69.0 in Accession #:    [id number] Weight:       311.5 lb Date of Birth:  [birth year]   BSA:          2.494 m Patient Age:    62 years   BP:           177/66 mmHg Patient Gender: M          HR:           55 bpm. Exam Location:  Inpatient Procedure: 2D Echo, Color Doppler, Cardiac Doppler and Intracardiac            Opacification Agent Indications:    CHF I50.9  History:        Patient has prior history of Echocardiogram examinations, most                 recent 04/15/2021. CHF, COPD; Risk Factors:Hypertension.  Sonographer:    Harriette Bouillon RDCS Referring Phys: [id number]  SHENG L HALEY IMPRESSIONS  1. Left ventricular ejection fraction, by estimation, is 35 to 40%. The left ventricle has moderately decreased function. The left ventricle demonstrates global hypokinesis. The left ventricular internal cavity size was severely dilated. There is mild left ventricular hypertrophy. Left ventricular diastolic parameters are consistent with Grade II diastolic dysfunction (pseudonormalization).  2. Right ventricular systolic function is normal. The right ventricular size is normal. Tricuspid regurgitation signal is inadequate for assessing PA pressure.  3. Left atrial size was severely dilated.  4. The mitral valve is grossly normal. Trivial mitral valve regurgitation. No evidence of mitral stenosis.  5. Moderate eccentric AI. The aortic valve is tricuspid. Aortic valve regurgitation is moderate. No aortic stenosis is present.  6. Aortic dilatation noted. There is borderline dilatation of the  ascending aorta, measuring 40 mm.  7. The inferior vena cava is dilated in size with <50% respiratory variability, suggesting right atrial pressure of 15 mmHg. FINDINGS  Left Ventricle: Left ventricular ejection fraction, by estimation, is 35 to 40%. The left ventricle has moderately decreased function. The left ventricle demonstrates global hypokinesis. The left ventricular internal cavity size was severely dilated. There is mild left ventricular hypertrophy. Left ventricular diastolic parameters are consistent with Grade II diastolic dysfunction (pseudonormalization). Right Ventricle: The right ventricular size is normal. Right vetricular wall thickness was not well visualized. Right ventricular systolic function is normal. Tricuspid regurgitation signal is inadequate for assessing PA pressure. Left Atrium: Left atrial size was severely dilated. Right Atrium: Right atrial size was normal in size. Pericardium: There is no evidence of pericardial effusion. Mitral Valve: The mitral valve is grossly normal.  Trivial mitral valve regurgitation. No evidence of mitral valve stenosis. Tricuspid Valve: The tricuspid valve is normal in structure. Tricuspid valve regurgitation is trivial. No evidence of tricuspid stenosis. Aortic Valve: Moderate eccentric AI. The aortic valve is tricuspid. Aortic valve regurgitation is moderate. No aortic stenosis is present. Aortic valve mean gradient measures 8.4 mmHg. Aortic valve peak gradient measures 18.9 mmHg. Aortic valve area, by VTI measures 2.77 cm. Pulmonic Valve: The pulmonic valve was not well visualized. Pulmonic valve regurgitation is trivial. No evidence of pulmonic stenosis. Aorta: Aortic dilatation noted and the aortic root is normal in size and structure. There is borderline dilatation of the ascending aorta, measuring 40 mm. Venous: The inferior vena cava is dilated in size with less than 50% respiratory variability, suggesting right atrial pressure of 15 mmHg. IAS/Shunts: The interatrial septum was not well visualized.  LEFT VENTRICLE PLAX 2D LVIDd:         7.60 cm      Diastology LVIDs:         6.40 cm      LV e' medial:    5.00 cm/s LV PW:         1.30 cm      LV E/e' medial:  20.8 LV IVS:        1.30 cm      LV e' lateral:   7.83 cm/s LVOT diam:     2.46 cm      LV E/e' lateral: 13.3 LV SV:         116 LV SV Index:   47 LVOT Area:     4.75 cm  LV Volumes (MOD) LV vol d, MOD A4C: 402.0 ml LV vol s, MOD A4C: 257.0 ml LV SV MOD A4C:     402.0 ml RIGHT VENTRICLE         IVC TAPSE (M-mode): 2.7 cm  IVC diam: 2.90 cm LEFT ATRIUM              Index LA diam:        5.60 cm  2.25 cm/m LA Vol (A2C):   180.0 ml 72.17 ml/m LA Vol (A4C):   135.0 ml 54.13 ml/m LA Biplane Vol: 160.0 ml 64.15 ml/m  AORTIC VALVE AV Area (Vmax):    2.52 cm AV Area (Vmean):   2.64 cm AV Area (VTI):     2.77 cm AV Vmax:           217.18 cm/s AV Vmean:          132.793 cm/s AV VTI:            0.421 m AV Peak Grad:  18.9 mmHg AV Mean Grad:      8.4 mmHg LVOT Vmax:         115.00 cm/s LVOT  Vmean:        73.700 cm/s LVOT VTI:          0.245 m LVOT/AV VTI ratio: 0.58  AORTA Ao Root diam: 3.60 cm Ao Asc diam:  4.00 cm MITRAL VALVE MV Area (PHT): 2.31 cm     SHUNTS MV Decel Time: 329 msec     Systemic VTI:  0.24 m MV E velocity: 104.00 cm/s  Systemic Diam: 2.46 cm MV A velocity: 56.30 cm/s MV E/A ratio:  1.85 Weston Brass MD Electronically signed by Weston Brass MD Signature Date/Time: 04/13/2023/10:42:01 PM    Final    DG Chest 2 View Result Date: 04/13/2023 CLINICAL DATA:  Chest pain EXAM: CHEST - 2 VIEW COMPARISON:  06/03/2022 FINDINGS: Improvement in the mild perihilar vascular congestion. No focal infiltrate. Stable cardiomegaly. No effusion. Visualized bones unremarkable. IMPRESSION: Cardiomegaly with improved vascular congestion. Electronically Signed   By: Corlis Leak M.D.   On: 04/13/2023 08:41    Cardiac Studies:  ECG: SR rate 64 no acute changes    Telemetry:  NSR   Echo: EF 35-40% moderate AR   Medications:    allopurinol  300 mg Oral Daily   amiodarone  100 mg Oral Daily   apixaban  5 mg Oral BID   carvedilol  12.5 mg Oral BID WC   hydrALAZINE  100 mg Oral Q8H   influenza vac split trivalent PF  0.5 mL Intramuscular Tomorrow-1000   isosorbide mononitrate  120 mg Oral Daily   mometasone-formoterol  2 puff Inhalation BID   tamsulosin  0.4 mg Oral Daily      furosemide 120 mg (04/14/23 0528)    Assessment/Plan:   CHF:  due to volume excess at home. 3L diuresis on iv lasix Continue this today. BNP chronically elevated EF 35-40% non ischemic DCM with moderate AR GDMT with coreg, lasix hydralazine and isordil  PAF:  on amiodarone and beta blocker DOAC with eliquis maintaining NSR CRF:  likely HTN heart dx Cr baseline around 3 stable so far with diuresis   Charlton Haws 04/14/2023, 10:33 AM

## 2023-04-14 NOTE — Progress Notes (Signed)
   04/13/23 2300  BiPAP/CPAP/SIPAP  $ Non-Invasive Home Ventilator  Initial  $ Face Mask Medium Yes  BiPAP/CPAP/SIPAP Pt Type Adult  BiPAP/CPAP/SIPAP DREAMSTATIOND  Mask Type Full face mask  Mask Size Medium  Set Rate 0 breaths/min  Respiratory Rate 18 breaths/min  Patient Home Equipment No  Auto Titrate Yes (19-25 PER pt HOME regemint)

## 2023-04-15 ENCOUNTER — Encounter (HOSPITAL_COMMUNITY): Payer: Self-pay | Admitting: Internal Medicine

## 2023-04-15 ENCOUNTER — Ambulatory Visit: Payer: Managed Care, Other (non HMO) | Admitting: Internal Medicine

## 2023-04-15 DIAGNOSIS — I509 Heart failure, unspecified: Secondary | ICD-10-CM

## 2023-04-15 DIAGNOSIS — I5043 Acute on chronic combined systolic (congestive) and diastolic (congestive) heart failure: Secondary | ICD-10-CM | POA: Diagnosis not present

## 2023-04-15 LAB — CBC
HCT: 34.7 % — ABNORMAL LOW (ref 39.0–52.0)
Hemoglobin: 10.7 g/dL — ABNORMAL LOW (ref 13.0–17.0)
MCH: 29.2 pg (ref 26.0–34.0)
MCHC: 30.8 g/dL (ref 30.0–36.0)
MCV: 94.8 fL (ref 80.0–100.0)
Platelets: 126 10*3/uL — ABNORMAL LOW (ref 150–400)
RBC: 3.66 MIL/uL — ABNORMAL LOW (ref 4.22–5.81)
RDW: 15.3 % (ref 11.5–15.5)
WBC: 2.6 10*3/uL — ABNORMAL LOW (ref 4.0–10.5)
nRBC: 0 % (ref 0.0–0.2)

## 2023-04-15 LAB — BASIC METABOLIC PANEL
Anion gap: 7 (ref 5–15)
BUN: 48 mg/dL — ABNORMAL HIGH (ref 6–20)
CO2: 31 mmol/L (ref 22–32)
Calcium: 8.9 mg/dL (ref 8.9–10.3)
Chloride: 104 mmol/L (ref 98–111)
Creatinine, Ser: 2.92 mg/dL — ABNORMAL HIGH (ref 0.61–1.24)
GFR, Estimated: 24 mL/min — ABNORMAL LOW (ref >60)
Glucose, Bld: 80 mg/dL (ref 70–99)
Potassium: 3.6 mmol/L (ref 3.5–5.1)
Sodium: 142 mmol/L (ref 135–145)

## 2023-04-15 LAB — VITAMIN B12: Vitamin B-12: 256 pg/mL (ref 180–914)

## 2023-04-15 MED ORDER — BUDESONIDE-FORMOTEROL FUMARATE 80-4.5 MCG/ACT IN AERO: 2.0000 | INHALATION_SPRAY | Freq: Two times a day (BID) | RESPIRATORY_TRACT | 3 refills | Status: DC

## 2023-04-15 MED ORDER — METOLAZONE 5 MG PO TABS: 5.0000 mg | ORAL_TABLET | ORAL | 1 refills | Status: DC

## 2023-04-15 MED ORDER — TORSEMIDE 20 MG PO TABS
60.0000 mg | ORAL_TABLET | Freq: Two times a day (BID) | ORAL | Status: DC
  Filled 2023-04-15: qty 3

## 2023-04-15 MED ORDER — TORSEMIDE 20 MG PO TABS: 60.0000 mg | ORAL_TABLET | Freq: Two times a day (BID) | ORAL | 3 refills | Status: DC

## 2023-04-15 NOTE — Progress Notes (Signed)
Cardiologist:  Mealor/Bensimohn   Subjective:  Denies SSCP, palpitations or Dyspnea Good diuresis Ready for d/c  Objective:  Vitals:   04/15/23 0456 04/15/23 0500 04/15/23 0753 04/15/23 0833  BP: 130/64  (!) 154/74   Pulse: 74  (!) 127 70  Resp: 18  16 19   Temp: 98.6 F (37 C)  98.3 F (36.8 C)   TempSrc: Oral  Oral   SpO2: 96%  94%   Weight:  (!) 136.8 kg    Height:        Intake/Output from previous day:  Intake/Output Summary (Last 24 hours) at 04/15/2023 1037 Last data filed at 04/15/2023 0826 Gross per 24 hour  Intake 1002 ml  Output 3400 ml  Net -2398 ml    Physical Exam: Obese black male Lungs clear No murmur Abdomen benign Plus one bilateral edema  Lab Results: Basic Metabolic Panel: Recent Labs    04/13/23 0727 04/14/23 0358 04/15/23 0352  NA 142 142 142  K 4.0 3.3* 3.6  CL 106 105 104  CO2 29 29 31   GLUCOSE 83 84 80  BUN 45* 49* 48*  CREATININE 2.92* 3.02* 2.92*  CALCIUM 8.9 8.8* 8.9  MG 2.1  --   --    Liver Function Tests: Recent Labs    04/13/23 0727  AST 27  ALT 24  ALKPHOS 92  BILITOT 0.9  PROT 6.7  ALBUMIN 3.8   CBC: Recent Labs    04/14/23 0358 04/15/23 0352  WBC 3.1* 2.6*  HGB 10.5* 10.7*  HCT 34.0* 34.7*  MCV 94.4 94.8  PLT 113* 126*    Imaging: ECHOCARDIOGRAM COMPLETE Result Date: 04/13/2023    ECHOCARDIOGRAM REPORT   Patient Name:   Phillip [name] Date of Exam: 04/13/2023 Medical Rec #:  [id number]  Height:       69.0 in Accession #:    [id number] Weight:       311.5 lb Date of Birth:  1960-04-20   BSA:          2.494 m Patient Age:    62 years   BP:           177/66 mmHg Patient Gender: M          HR:           55 bpm. Exam Location:  Inpatient Procedure: 2D Echo, Color Doppler, Cardiac Doppler and Intracardiac            Opacification Agent Indications:    CHF I50.9  History:        Patient has prior history of Echocardiogram examinations, most                 recent 04/15/2021. CHF, COPD; Risk Factors:Hypertension.   Sonographer:    Harriette Bouillon RDCS Referring Phys: [id number] SHENG L HALEY IMPRESSIONS  1. Left ventricular ejection fraction, by estimation, is 35 to 40%. The left ventricle has moderately decreased function. The left ventricle demonstrates global hypokinesis. The left ventricular internal cavity size was severely dilated. There is mild left ventricular hypertrophy. Left ventricular diastolic parameters are consistent with Grade II diastolic dysfunction (pseudonormalization).  2. Right ventricular systolic function is normal. The right ventricular size is normal. Tricuspid regurgitation signal is inadequate for assessing PA pressure.  3. Left atrial size was severely dilated.  4. The mitral valve is grossly normal. Trivial mitral valve regurgitation. No evidence of mitral stenosis.  5. Moderate eccentric AI. The aortic valve is tricuspid. Aortic valve regurgitation is moderate. No aortic  stenosis is present.  6. Aortic dilatation noted. There is borderline dilatation of the ascending aorta, measuring 40 mm.  7. The inferior vena cava is dilated in size with <50% respiratory variability, suggesting right atrial pressure of 15 mmHg. FINDINGS  Left Ventricle: Left ventricular ejection fraction, by estimation, is 35 to 40%. The left ventricle has moderately decreased function. The left ventricle demonstrates global hypokinesis. The left ventricular internal cavity size was severely dilated. There is mild left ventricular hypertrophy. Left ventricular diastolic parameters are consistent with Grade II diastolic dysfunction (pseudonormalization). Right Ventricle: The right ventricular size is normal. Right vetricular wall thickness was not well visualized. Right ventricular systolic function is normal. Tricuspid regurgitation signal is inadequate for assessing PA pressure. Left Atrium: Left atrial size was severely dilated. Right Atrium: Right atrial size was normal in size. Pericardium: There is no evidence of pericardial  effusion. Mitral Valve: The mitral valve is grossly normal. Trivial mitral valve regurgitation. No evidence of mitral valve stenosis. Tricuspid Valve: The tricuspid valve is normal in structure. Tricuspid valve regurgitation is trivial. No evidence of tricuspid stenosis. Aortic Valve: Moderate eccentric AI. The aortic valve is tricuspid. Aortic valve regurgitation is moderate. No aortic stenosis is present. Aortic valve mean gradient measures 8.4 mmHg. Aortic valve peak gradient measures 18.9 mmHg. Aortic valve area, by VTI measures 2.77 cm. Pulmonic Valve: The pulmonic valve was not well visualized. Pulmonic valve regurgitation is trivial. No evidence of pulmonic stenosis. Aorta: Aortic dilatation noted and the aortic root is normal in size and structure. There is borderline dilatation of the ascending aorta, measuring 40 mm. Venous: The inferior vena cava is dilated in size with less than 50% respiratory variability, suggesting right atrial pressure of 15 mmHg. IAS/Shunts: The interatrial septum was not well visualized.  LEFT VENTRICLE PLAX 2D LVIDd:         7.60 cm      Diastology LVIDs:         6.40 cm      LV e' medial:    5.00 cm/s LV PW:         1.30 cm      LV E/e' medial:  20.8 LV IVS:        1.30 cm      LV e' lateral:   7.83 cm/s LVOT diam:     2.46 cm      LV E/e' lateral: 13.3 LV SV:         116 LV SV Index:   47 LVOT Area:     4.75 cm  LV Volumes (MOD) LV vol d, MOD A4C: 402.0 ml LV vol s, MOD A4C: 257.0 ml LV SV MOD A4C:     402.0 ml RIGHT VENTRICLE         IVC TAPSE (M-mode): 2.7 cm  IVC diam: 2.90 cm LEFT ATRIUM              Index LA diam:        5.60 cm  2.25 cm/m LA Vol (A2C):   180.0 ml 72.17 ml/m LA Vol (A4C):   135.0 ml 54.13 ml/m LA Biplane Vol: 160.0 ml 64.15 ml/m  AORTIC VALVE AV Area (Vmax):    2.52 cm AV Area (Vmean):   2.64 cm AV Area (VTI):     2.77 cm AV Vmax:           217.18 cm/s AV Vmean:          132.793 cm/s AV VTI:  0.421 m AV Peak Grad:      18.9 mmHg AV Mean  Grad:      8.4 mmHg LVOT Vmax:         115.00 cm/s LVOT Vmean:        73.700 cm/s LVOT VTI:          0.245 m LVOT/AV VTI ratio: 0.58  AORTA Ao Root diam: 3.60 cm Ao Asc diam:  4.00 cm MITRAL VALVE MV Area (PHT): 2.31 cm     SHUNTS MV Decel Time: 329 msec     Systemic VTI:  0.24 m MV E velocity: 104.00 cm/s  Systemic Diam: 2.46 cm MV A velocity: 56.30 cm/s MV E/A ratio:  1.85 Weston Brass MD Electronically signed by Weston Brass MD Signature Date/Time: 04/13/2023/10:42:01 PM    Final     Cardiac Studies:  ECG: SR rate 64 no acute changes    Telemetry:  NSR   Echo: EF 35-40% moderate AR   Medications:    allopurinol  300 mg Oral Daily   amiodarone  100 mg Oral Daily   apixaban  5 mg Oral BID   carvedilol  12.5 mg Oral BID WC   hydrALAZINE  100 mg Oral Q8H   influenza vac split trivalent PF  0.5 mL Intramuscular Tomorrow-1000   isosorbide mononitrate  120 mg Oral Daily   mometasone-formoterol  2 puff Inhalation BID   tamsulosin  0.4 mg Oral Daily      furosemide 120 mg (04/15/23 0504)    Assessment/Plan:   CHF:  due to volume excess at home. Good IP diuresis  Continue this today. BNP chronically elevated EF 35-40% non ischemic DCM with moderate AR GDMT with coreg, hydralazine and isordil D/c with oral demedex 60 mg bid and zaroxyln 5 mg PRN for 3 lb weight gain PAF:  on amiodarone and beta blocker DOAC with eliquis maintaining NSR CRF:  likely HTN heart dx Cr baseline around 3 stable so far with diuresis   Will arrange outpatient f/u with DB and CHF clinic   Charlton Haws 04/15/2023, 10:37 AM

## 2023-04-15 NOTE — Discharge Summary (Signed)
Physician Discharge Summary   Patient: Phillip [name] MRN: [id number] DOB: 03-28-1960  Admit date:     04/13/2023  Discharge date: 04/15/23  Discharge Physician: Alba Cory   PCP: Karie Schwalbe, MD   Recommendations at discharge:    Needs follow up with cardiology, further adjustment of diuretics.  Needs follow up with Nephrology   Discharge Diagnoses: Active Problems:   Acute CHF (congestive heart failure) (HCC)   Acute on chronic combined systolic and diastolic CHF (congestive heart failure) (HCC)   Acute on chronic systolic CHF (congestive heart failure) (HCC)  Resolved Problems:   * No resolved hospital problems. *  Hospital Course: 63 y.o. male with medical history significant for morbid obesity, COPD on room air, history of DVT, paroxysmal atrial fibrillation on Eliquis and heart failure with reduced EF on oral diuretic being admitted to the hospital with acute on chronic heart failure with reduced EF.  Symptoms have been ongoing for about 3 weeks despite compliance with diuretics.  He even took an extra dose of torsemide for a few days.   Assessment and Plan: Acute on chronic systolic CHF Echocardiogram repeated during this admission shows EF of 35 to 40%.  Again global hypokinesis is noted.  Grade 2 diastolic dysfunction is also noted. Patient has been seen by cardiology.  Patient is on high-dose furosemide.  He is on carvedilol.  On nitrates and hydralazine.  Due to elevated creatinine he is not a candidate for ACE inhibitor or ARB or Entresto.   -Good diuresis with 120 mg IV every 8 hours.  -Negative 5 L.  -plan to discharge on Torsemide and Zaroxylin 5 mg PRN Ok to discharge home today per cardiology   Chronic kidney disease stage IIIb/hypokalemia Mild increase in creatinine noted but still close to his baseline.[name]  He is followed by Dr. Thedore Mins with nephrology in the outpatient setting.   Renal function stable.    History of paroxysmal atrial fibrillation Noted  to be on amiodarone.  On carvedilol.  Continue with Eliquis.   Normocytic anemia/mild thrombocytopenia Monitor counts closely.   History of gout Continue with allopurinol.   History of BPH Continue Flomax.   Morbid obesity Estimated body mass index is 46 kg/m as calculated from the following:   Height as of this encounter: 5\' 9"  (1.753 m).   Weight as of this encounter: 141.3 kg.            Consultants: cardiology  Procedures performed: none Disposition: Home Diet recommendation:  Discharge Diet Orders (From admission, onward)     Start     Ordered   04/15/23 0000  Diet - low sodium heart healthy        04/15/23 1102           Cardiac diet DISCHARGE MEDICATION: Allergies as of 04/15/2023       Reactions   Other Anaphylaxis   mushrooms        Medication List     STOP taking these medications    Klor-Con M20 20 MEQ tablet Generic drug: potassium chloride SA   oxymetazoline 0.05 % nasal spray Commonly known as: Afrin Nasal Spray       TAKE these medications    albuterol 108 (90 Base) MCG/ACT inhaler Commonly known as: ProAir HFA Inhale 2 puffs into the lungs every 4 (four) hours as needed for wheezing or shortness of breath.   albuterol (2.5 MG/3ML) 0.083% nebulizer solution Commonly known as: PROVENTIL INHALE 1 VIAL VIA NEBULIZER EVERY 6  HOURS AS NEEDED FOR WHEEZING/SHORTNESS OF BREATH.   allopurinol 300 MG tablet Commonly known as: ZYLOPRIM Take 1 tablet (300 mg total) by mouth daily.   amiodarone 200 MG tablet Commonly known as: PACERONE Take 0.5 tablets (100 mg total) by mouth daily.   apixaban 5 MG Tabs tablet Commonly known as: Eliquis Take 1 tablet (5 mg total) by mouth 2 (two) times daily.   budesonide-formoterol 80-4.5 MCG/ACT inhaler Commonly known as: Symbicort Inhale 2 puffs into the lungs 2 (two) times daily. What changed: See the new instructions.   calcium carbonate 1250 (500 Ca) MG tablet Commonly known as: OS-CAL  - dosed in mg of elemental calcium Take 1 tablet by mouth every 3 (three) days.   carvedilol 6.25 MG tablet Commonly known as: COREG Take 2 tablets (12.5 mg total) by mouth 2 (two) times daily with a meal.   fluticasone 50 MCG/ACT nasal spray Commonly known as: FLONASE SPRAY 2 SPRAYS INTO EACH NOSTRIL EVERY DAY What changed: See the new instructions.   hydrALAZINE 100 MG tablet Commonly known as: APRESOLINE Take 1 tablet (100 mg total) by mouth every 8 (eight) hours.   isosorbide mononitrate 120 MG 24 hr tablet Commonly known as: IMDUR Take 1 tablet (120 mg total) by mouth daily.   metolazone 5 MG tablet Commonly known as: ZAROXOLYN Take 1 tablet (5 mg total) by mouth as directed. As directed by [facility]. What changed:  medication strength how much to take additional instructions   tamsulosin 0.4 MG Caps capsule Commonly known as: FLOMAX Take 1 capsule (0.4 mg total) by mouth daily.   torsemide 20 MG tablet Commonly known as: DEMADEX Take 3 tablets (60 mg total) by mouth 2 (two) times daily.   Vitamin D3 50 MCG (2000 UT) Tabs Take 2,000 Units by mouth daily.        Follow-up Information     Tillman Abide I, MD Follow up in 1 week(s).   Specialties: Internal Medicine, Pediatrics Contact information: [address] [place] [address] [phone]                Discharge Exam: Ceasar Mons Weights   04/13/23 0729 04/13/23 1110 04/15/23 0500  Weight: (!) 137.4 kg (!) 141.3 kg (!) 136.8 kg   General; NAD  Condition at discharge: stable  The results of significant diagnostics from this hospitalization (including imaging, microbiology, ancillary and laboratory) are listed below for reference.   Imaging Studies: ECHOCARDIOGRAM COMPLETE Result Date: 04/13/2023    ECHOCARDIOGRAM REPORT   Patient Name:   Phillip [name] Date of Exam: 04/13/2023 Medical Rec #:  [id number]  Height:       69.0 in Accession #:    [id number] Weight:       311.5 lb  Date of Birth:  Oct 05, 1960   BSA:          2.494 m Patient Age:    62 years   BP:           177/66 mmHg Patient Gender: M          HR:           55 bpm. Exam Location:  Inpatient Procedure: 2D Echo, Color Doppler, Cardiac Doppler and Intracardiac            Opacification Agent Indications:    CHF I50.9  History:        Patient has prior history of Echocardiogram examinations, most  recent 04/15/2021. CHF, COPD; Risk Factors:Hypertension.  Sonographer:    Harriette Bouillon RDCS Referring Phys: [id number] SHENG L HALEY IMPRESSIONS  1. Left ventricular ejection fraction, by estimation, is 35 to 40%. The left ventricle has moderately decreased function. The left ventricle demonstrates global hypokinesis. The left ventricular internal cavity size was severely dilated. There is mild left ventricular hypertrophy. Left ventricular diastolic parameters are consistent with Grade II diastolic dysfunction (pseudonormalization).  2. Right ventricular systolic function is normal. The right ventricular size is normal. Tricuspid regurgitation signal is inadequate for assessing PA pressure.  3. Left atrial size was severely dilated.  4. The mitral valve is grossly normal. Trivial mitral valve regurgitation. No evidence of mitral stenosis.  5. Moderate eccentric AI. The aortic valve is tricuspid. Aortic valve regurgitation is moderate. No aortic stenosis is present.  6. Aortic dilatation noted. There is borderline dilatation of the ascending aorta, measuring 40 mm.  7. The inferior vena cava is dilated in size with <50% respiratory variability, suggesting right atrial pressure of 15 mmHg. FINDINGS  Left Ventricle: Left ventricular ejection fraction, by estimation, is 35 to 40%. The left ventricle has moderately decreased function. The left ventricle demonstrates global hypokinesis. The left ventricular internal cavity size was severely dilated. There is mild left ventricular hypertrophy. Left ventricular diastolic parameters  are consistent with Grade II diastolic dysfunction (pseudonormalization). Right Ventricle: The right ventricular size is normal. Right vetricular wall thickness was not well visualized. Right ventricular systolic function is normal. Tricuspid regurgitation signal is inadequate for assessing PA pressure. Left Atrium: Left atrial size was severely dilated. Right Atrium: Right atrial size was normal in size. Pericardium: There is no evidence of pericardial effusion. Mitral Valve: The mitral valve is grossly normal. Trivial mitral valve regurgitation. No evidence of mitral valve stenosis. Tricuspid Valve: The tricuspid valve is normal in structure. Tricuspid valve regurgitation is trivial. No evidence of tricuspid stenosis. Aortic Valve: Moderate eccentric AI. The aortic valve is tricuspid. Aortic valve regurgitation is moderate. No aortic stenosis is present. Aortic valve mean gradient measures 8.4 mmHg. Aortic valve peak gradient measures 18.9 mmHg. Aortic valve area, by VTI measures 2.77 cm. Pulmonic Valve: The pulmonic valve was not well visualized. Pulmonic valve regurgitation is trivial. No evidence of pulmonic stenosis. Aorta: Aortic dilatation noted and the aortic root is normal in size and structure. There is borderline dilatation of the ascending aorta, measuring 40 mm. Venous: The inferior vena cava is dilated in size with less than 50% respiratory variability, suggesting right atrial pressure of 15 mmHg. IAS/Shunts: The interatrial septum was not well visualized.  LEFT VENTRICLE PLAX 2D LVIDd:         7.60 cm      Diastology LVIDs:         6.40 cm      LV e' medial:    5.00 cm/s LV PW:         1.30 cm      LV E/e' medial:  20.8 LV IVS:        1.30 cm      LV e' lateral:   7.83 cm/s LVOT diam:     2.46 cm      LV E/e' lateral: 13.3 LV SV:         116 LV SV Index:   47 LVOT Area:     4.75 cm  LV Volumes (MOD) LV vol d, MOD A4C: 402.0 ml LV vol s, MOD A4C: 257.0 ml LV SV MOD A4C:  402.0 ml RIGHT VENTRICLE          IVC TAPSE (M-mode): 2.7 cm  IVC diam: 2.90 cm LEFT ATRIUM              Index LA diam:        5.60 cm  2.25 cm/m LA Vol (A2C):   180.0 ml 72.17 ml/m LA Vol (A4C):   135.0 ml 54.13 ml/m LA Biplane Vol: 160.0 ml 64.15 ml/m  AORTIC VALVE AV Area (Vmax):    2.52 cm AV Area (Vmean):   2.64 cm AV Area (VTI):     2.77 cm AV Vmax:           217.18 cm/s AV Vmean:          132.793 cm/s AV VTI:            0.421 m AV Peak Grad:      18.9 mmHg AV Mean Grad:      8.4 mmHg LVOT Vmax:         115.00 cm/s LVOT Vmean:        73.700 cm/s LVOT VTI:          0.245 m LVOT/AV VTI ratio: 0.58  AORTA Ao Root diam: 3.60 cm Ao Asc diam:  4.00 cm MITRAL VALVE MV Area (PHT): 2.31 cm     SHUNTS MV Decel Time: 329 msec     Systemic VTI:  0.24 m MV E velocity: 104.00 cm/s  Systemic Diam: 2.46 cm MV A velocity: 56.30 cm/s MV E/A ratio:  1.85 Weston Brass MD Electronically signed by Weston Brass MD Signature Date/Time: 04/13/2023/10:42:01 PM    Final    DG Chest 2 View Result Date: 04/13/2023 CLINICAL DATA:  Chest pain EXAM: CHEST - 2 VIEW COMPARISON:  06/03/2022 FINDINGS: Improvement in the mild perihilar vascular congestion. No focal infiltrate. Stable cardiomegaly. No effusion. Visualized bones unremarkable. IMPRESSION: Cardiomegaly with improved vascular congestion. Electronically Signed   By: Corlis Leak M.D.   On: 04/13/2023 08:41    Microbiology: Results for orders placed or performed during the hospital encounter of 04/13/21  Resp Panel by RT-PCR (Flu A&B, Covid) Nasopharyngeal Swab     Status: None   Collection Time: 04/14/21 12:27 AM   Specimen: Nasopharyngeal Swab; Nasopharyngeal(NP) swabs in vial transport medium  Result Value Ref Range Status   SARS Coronavirus 2 by RT PCR NEGATIVE NEGATIVE Final    Comment: (NOTE) SARS-CoV-2 target nucleic acids are NOT DETECTED.  The SARS-CoV-2 RNA is generally detectable in upper respiratory specimens during the acute phase of infection. The lowest concentration of  SARS-CoV-2 viral copies this assay can detect is 138 copies/mL. A negative result does not preclude SARS-Cov-2 infection and should not be used as the sole basis for treatment or other patient management decisions. A negative result may occur with  improper specimen collection/handling, submission of specimen other than nasopharyngeal swab, presence of viral mutation(s) within the areas targeted by this assay, and inadequate number of viral copies(<138 copies/mL). A negative result must be combined with clinical observations, patient history, and epidemiological information. The expected result is Negative.  Fact Sheet for Patients:  BloggerCourse.com  Fact Sheet for Healthcare Providers:  SeriousBroker.it  This test is no t yet approved or cleared by the [place] FDA and  has been authorized for detection and/or diagnosis of SARS-CoV-2 by FDA under an Emergency Use Authorization (EUA). This EUA will remain  in effect (meaning this test can be used) for the duration of the  COVID-19 declaration under Section 564(b)(1) of the Act, 21 U.S.C.section 360bbb-3(b)(1), unless the authorization is terminated  or revoked sooner.       Influenza A by PCR NEGATIVE NEGATIVE Final   Influenza B by PCR NEGATIVE NEGATIVE Final    Comment: (NOTE) The Xpert Xpress SARS-CoV-2/FLU/RSV plus assay is intended as an aid in the diagnosis of influenza from Nasopharyngeal swab specimens and should not be used as a sole basis for treatment. Nasal washings and aspirates are unacceptable for Xpert Xpress SARS-CoV-2/FLU/RSV testing.  Fact Sheet for Patients: BloggerCourse.com  Fact Sheet for Healthcare Providers: SeriousBroker.it  This test is not yet approved or cleared by the [place] FDA and has been authorized for detection and/or diagnosis of SARS-CoV-2 by FDA under an Emergency Use  Authorization (EUA). This EUA will remain in effect (meaning this test can be used) for the duration of the COVID-19 declaration under Section 564(b)(1) of the Act, 21 U.S.C. section 360bbb-3(b)(1), unless the authorization is terminated or revoked.  Performed at [facility], [address], [place], [place] [address]     Labs: CBC: Recent Labs  Lab 04/13/23 [phone] 04/14/23 0358 04/15/23 0352  WBC 3.7* 3.1* 2.6*  HGB 11.5* 10.5* 10.7*  HCT 38.2* 34.0* 34.7*  MCV 96.7 94.4 94.8  PLT 137* 113* 126*   Basic Metabolic Panel: Recent Labs  Lab 04/13/23 0727 04/14/23 0358 04/15/23 0352  NA 142 142 142  K 4.0 3.3* 3.6  CL 106 105 104  CO2 29 29 31   GLUCOSE 83 84 80  BUN 45* 49* 48*  CREATININE 2.92* 3.02* 2.92*  CALCIUM 8.9 8.8* 8.9  MG 2.1  --   --    Liver Function Tests: Recent Labs  Lab 04/13/23 0727  AST 27  ALT 24  ALKPHOS 92  BILITOT 0.9  PROT 6.7  ALBUMIN 3.8   CBG: No results for input(s): "GLUCAP" in the last 168 hours.  Discharge time spent: greater than 30 minutes.  Signed: Alba Cory, MD [facility] 04/15/2023

## 2023-04-15 NOTE — Discharge Instructions (Signed)
zaroxyln 5 mg as needed for 3 lb weight gain

## 2023-04-16 ENCOUNTER — Ambulatory Visit: Payer: Managed Care, Other (non HMO) | Admitting: Internal Medicine

## 2023-04-16 ENCOUNTER — Encounter: Payer: Self-pay | Admitting: Internal Medicine

## 2023-04-16 VITALS — BP 126/66 | HR 70 | Temp 98.6°F | Ht 69.0 in | Wt 303.8 lb

## 2023-04-16 DIAGNOSIS — N1832 Chronic kidney disease, stage 3b: Secondary | ICD-10-CM | POA: Diagnosis not present

## 2023-04-16 DIAGNOSIS — J4530 Mild persistent asthma, uncomplicated: Secondary | ICD-10-CM

## 2023-04-16 DIAGNOSIS — I5023 Acute on chronic systolic (congestive) heart failure: Secondary | ICD-10-CM

## 2023-04-16 MED ORDER — BUDESONIDE-FORMOTEROL FUMARATE 80-4.5 MCG/ACT IN AERO: INHALATION_SPRAY | RESPIRATORY_TRACT | 3 refills | Status: AC

## 2023-04-16 NOTE — Progress Notes (Signed)
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 cough but mainly non productive, only has chest tightness when he exerts himself, he states he does has some chest conegstion Denies fever, pt unsure if his recent trip to [place] has anything to do with his symtpoms   worse x 3 weeks but better since in ER with dx with chf 09/15/16 and sleeping fine on cpap  Much better p lasix Some better after saba but mostly using neb and skipping hfa saba in action paln  Minimal rattling better p extra dose of diuretic rec Plan A = Automatic = Symbicort 80 Take 2 puffs first thing in am and then another 2 puffs about 12 hours later.  Plan B = Backup Only use your albuterol as a rescue medication to be used if you can't catch your breath Plan C = Crisis - only use your albuterol nebulizer if you first try Plan B     Admit date:     04/13/2023  Discharge date: 04/15/23    Discharge Diagnoses:   Acute CHF (congestive heart failure) (HCC)   Acute on chronic combined systolic and diastolic CHF (congestive heart failure) (HCC)   Acute on chronic systolic CHF (congestive heart failure) [facility])  Hospital Course: 63 y.o. male with medical history significant for morbid obesity, COPD on room air, history of DVT, paroxysmal atrial  fibrillation on Eliquis and heart failure with reduced EF on oral diuretic being admitted to the hospital with acute on chronic heart failure with reduced EF.  Symptoms have been ongoing for about 3 weeks despite compliance with diuretics.  He even took an extra dose of torsemide for a few days.    Assessment and Plan: Acute on chronic systolic CHF Echocardiogram repeated during this admission shows EF of 35 to 40%.  Again global hypokinesis is noted.  Grade 2 diastolic dysfunction is also noted. Patient has been seen by cardiology.  Patient is on high-dose furosemide.  He is on carvedilol.  On nitrates and hydralazine.  Due to elevated creatinine he is not a candidate for ACE inhibitor or ARB or Entresto.   -Good diuresis with 120 mg IV every 8 hours.  -Negative 5 L.  -plan to discharge on Torsemide and Zaroxylin 5 mg PRN Ok to discharge home today per cardiology    Chronic kidney disease stage IIIb/hypokalemia Mild increase in creatinine noted but still close to his baseline.[name]  He is followed by Dr. Thedore Mins with nephrology in the outpatient setting.   Renal function stable.    History of paroxysmal atrial fibrillation Noted to be on amiodarone.  On carvedilol.  Continue  with Eliquis.   Normocytic anemia/mild thrombocytopenia Monitor counts closely.   History of gout Continue with allopurinol.   History of BPH Continue Flomax.   Morbid obesity Estimated body mass index is 46 kg/m as calculated from the following:   Height as of this encounter: 5\' 9"  (1.753 m).   Weight as of this encounter: 141.3 kg.        04/16/2023  post hosp f/u  ov/Phillip [name] re: AB   maint off symbicort x Nov  2024 s much difference in symptoms   Chief Complaint  Patient presents with   Follow-up    Needs meds refilled.  Symbicort   Dyspnea:  [facility] ok / does not useHCP  Cough: minimal dry daytime  x 6 m  Sleeping: flat bed mostly R side and back  02: not using even when fluid building up   No obvious day  to day or daytime variability or assoc excess/ purulent sputum or mucus plugs or hemoptysis or cp or chest tightness, subjective wheeze or overt sinus or hb symptoms.    Also denies any obvious fluctuation of symptoms with weather or environmental changes or other aggravating or alleviating factors except as outlined above   No unusual exposure hx or h/o childhood pna/ asthma or knowledge of premature birth.  Current Allergies, Complete Past Medical History, Past Surgical History, Family History, and Social History were reviewed in [employer] record.  ROS  The following are not active complaints unless bolded Hoarseness, sore throat, dysphagia, dental problems, itching, sneezing,  nasal congestion or discharge of excess mucus or purulent secretions, ear ache,   fever, chills, sweats, unintended wt loss or wt gain, classically pleuritic or exertional cp,  orthopnea pnd or arm/hand swelling  or leg swelling/resolving , presyncope, palpitations, abdominal pain, anorexia, nausea, vomiting, diarrhea  or change in bowel habits or change in bladder habits, change in stools or change in urine, dysuria, hematuria,  rash, arthralgias, visual complaints, headache, numbness, weakness or ataxia or problems with walking or coordination,  change in mood or  memory.        Current Meds  Medication Sig   albuterol (PROAIR HFA) 108 (90 Base) MCG/ACT inhaler Inhale 2 puffs into the lungs every 4 (four) hours as needed for wheezing or shortness of breath.   albuterol (PROVENTIL) (2.5 MG/3ML) 0.083% nebulizer solution INHALE 1 VIAL VIA NEBULIZER EVERY 6 HOURS AS NEEDED FOR WHEEZING/SHORTNESS OF BREATH.   allopurinol (ZYLOPRIM) 300 MG tablet Take 1 tablet (300 mg total) by mouth daily.   amiodarone (PACERONE) 200 MG tablet Take 0.5 tablets (100 mg total) by mouth daily.   apixaban (ELIQUIS) 5 MG TABS tablet Take 1 tablet (5 mg total) by mouth 2 (two) times daily.   budesonide-formoterol  (SYMBICORT) 80-4.5 MCG/ACT inhaler Inhale 2 puffs into the lungs 2 (two) times daily.   calcium carbonate (OS-CAL - DOSED IN MG OF ELEMENTAL CALCIUM) 1250 (500 Ca) MG tablet Take 1 tablet by mouth every 3 (three) days.   carvedilol (COREG) 6.25 MG tablet Take 2 tablets (12.5 mg total) by mouth 2 (two) times daily with a meal.   Cholecalciferol (VITAMIN D3) 50 MCG (2000 UT) TABS Take 2,000 Units by mouth daily.   fluticasone (FLONASE) 50 MCG/ACT nasal spray SPRAY 2 SPRAYS INTO EACH NOSTRIL EVERY DAY (Patient taking differently: Place 2 sprays into both nostrils daily as needed for allergies or rhinitis.)   hydrALAZINE (APRESOLINE) 100 MG tablet Take 1 tablet (100 mg total) by mouth every  8 (eight) hours.   isosorbide mononitrate (IMDUR) 120 MG 24 hr tablet Take 1 tablet (120 mg total) by mouth daily.   metolazone (ZAROXOLYN) 5 MG tablet Take 1 tablet (5 mg total) by mouth as directed. As directed by [facility].   tamsulosin (FLOMAX) 0.4 MG CAPS capsule Take 1 capsule (0.4 mg total) by mouth daily.   torsemide (DEMADEX) 20 MG tablet Take 3 tablets (60 mg total) by mouth 2 (two) times daily.           Objective:   Physical Exam  Wts  04/16/2023          303  03/28/2021            319   10/07/2018          374   05/14/2018         382  12/17/2017          379  06/16/2017          374  03/09/2017      376  12/16/2016        372  09/30/2016        358  07/21/2016        362  04/22/2016        374  01/21/2016      390  12/27/2015        387   09/10/2015       372   07/24/15 389 lb 6.4 oz (176.631 kg)  07/08/15 384 lb 3.2 oz (174.272 kg)  06/21/15 390 lb 8 oz (177.13 kg)       Vital signs reviewed  04/16/2023  - Note at rest 02 sats  96% on ra  General appearance:    AMB MO (by BMI) bm nad    HEENT : Oropharynx  clear       NECK :  without  apparent JVD/ palpable Nodes/TM    LUNGS: no acc muscle use,  Nl contour chest which is clear to A and P bilaterally without cough on insp or  exp maneuvers   CV:  RRR  no s3 or murmur or increase in P2, and minimal pitting both LEs  ABD:  soft and nontender   MS:  Gait nl   ext warm without deformities Or obvious joint restrictions  calf tenderness, cyanosis or clubbing    SKIN: warm and dry without lesions    NEURO:  alert, approp, nl sensorium with  no motor or cerebellar deficits apparent.      I personally reviewed images and agree with radiology impression as follows:  CXR:  pa and lateral  04/12/22 Cardiomegaly with improved vascular congestion.      Assessment:
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
## 2023-04-16 NOTE — Patient Instructions (Addendum)
Wear 02 whenever fluid is building up>>>  goal is above 90s  Plan A = Automatic = Always=    symbicort 1-2 puffs every 12 hours as needed   Plan B = Backup (to supplement plan A, not to replace it) Only use your albuterol inhaler as a rescue medication to be used if you can't catch your breath by resting or doing a relaxed purse lip breathing pattern.  - The less you use it, the better it will work when you need it. - Ok to use the inhaler up to 2 puffs  every 4 hours if you must but call for appointment if use goes up over your usual need - Don't leave home without it !!  (think of it like the spare tire for your car)   Plan C = Crisis (instead of Plan B but only if Plan B stops working) - only use your albuterol nebulizer if you first try Plan B and it fails to help > ok to use the nebulizer up to every 4 hours but if start needing it regularly call for immediate appointment     Please schedule a follow up visit in 12 months but call sooner if needed

## 2023-04-20 ENCOUNTER — Other Ambulatory Visit (HOSPITAL_COMMUNITY): Payer: Self-pay

## 2023-04-20 ENCOUNTER — Telehealth: Payer: Self-pay

## 2023-04-20 NOTE — Telephone Encounter (Signed)
*  Pulm  Pharmacy Patient Advocate Encounter  Received notification from [facility] that Prior Authorization for Budesonide-Formoterol Fumarate 80-4.5MCG/ACT aerosol  has been APPROVED from 04/20/2023 to 04/19/2024. Ran test claim, Copay is $34.55. This test claim was processed through [facility]- copay amounts may vary at other pharmacies due to pharmacy/plan contracts, or as the patient moves through the different stages of their insurance plan.   PA #/Case ID/Reference #: [id number]

## 2023-04-22 NOTE — Progress Notes (Signed)
Advanced Heart Failure Clinic Note   PCP: Karie Schwalbe, MD Primary Cardiologist: Dr. Katrinka Blazing  EP: Dr Allred Nephrology: Dr. Thedore Mins HF Cardiologist: Dr Gala Romney   CC: HF follow up  HPI: Phillip [name] is a 62 y.o. male hx of PAF, remote PE, hx of DVT, OSA, morbid obesity, CKD Stage III,  HTN, and  combined chronic systolic/diastolic. S/P A Flutter ablation May 2019 .     EF in 2015 40-45% Echo 12/21/16 with EF 55-60% G2DD, mild aortic regurgitation.  Last nuc 2008 no ischemia, no hx of cath due to CKD.    Developed AFL and underwent AFL ablation in 5/19.  Seen 7/20 and was feeling bad. Found to be in AFL/AF in 80s. Echo at that time with EF back down to 30%. Underwent DC-CV on 10/14/18 and referred to AF Clinic.   Echo 11/20 EF 45-50%  Developed recurrent AF at the end of January 2021. Underwent attempt at [facility] on 04/22/19 but failed to convert. Seen back in AF Clinic on 2/5. Not able to use Tikosyn due to long QT. Not ablation candidate due to obesity. Considered amiodarone. Started on flecainide but had treadmill to monitor Flecainide Tx.   Patient had significant QRS widening within first minute of exercise.  Flecainide stopped  Echo 5/21 EF 45-50%  Recurrent AF in 9/22. Admitted. Had DC-CV. Amiodarone started.   Echo 9/22 EF 45-50% Marked LAE   Admitted 1/23 with a/c CHF and afib RVR. Echo EF 40-45%, global LV HK, RV ok. Diuresed and underwent DCCV.   Had AF ablation by Dr. Magdalene River at [facility] 07/23. Saw Afib clinic 08/23 and in rate controlled AF. Worse 7 day monitor - rhythm was sinus with 14% AF burden.  Seen in ED 01/12/22 with a/c CHF. States he had been drinking a lot of fluid. Given 80 mg IV lasix. He diuresed well and was felt to be stable for discharge home.   Follow up 11/23, NYHA II and volume stable. Saw EP 1/24, not ablation candidate yet due to weight. Continue amiodarone.  Admitted 1/25 with a/c HF. Echo 1/25 EF 35-40%, mild LVH, G2DD, RV normal. GDMT titrated and  he was discharged home, weight 310 lbs.  Today he returns for HF follow up. We have not seen him since 06/2022. Overall feeling fine. Has a URI so mildly SOB, but otherwise doing well. He has SOB climbing steps. Denies palpitations, CP, dizziness, edema, or PND/Orthopnea. Appetite ok. No fever or chills. Weight at home 296 pounds. Taking all medications. Has not needed to take metolazone. Wears CPAP, no ETOH or tobacco use. Works driving a dump truck. First grandbaby (girl) due 05/2023.   Cardiac Studies: - Echo 1/25: EF 35-40%  - Last nuc 2008 no ischemia  - Echo 2015 EF 40-45%  - Echo 12/21/16 with EF 55-60% G2DD, mild aortic regurgitation.    - Echo 7/20 EF 30%  - Echo 11/20 EF 45-50%  - Echo 5/21 EF 45-50%  - Echo 9/22 EF 45-50% Marked LAE   - Echo 12/23 EF 40-45%, global LV HK, RV ok.   Review of systems complete and found to be negative unless listed in HPI.    Past Medical History:  Diagnosis Date   Asthma    CHF (congestive heart failure) (HCC) 11/15   EF 40-45%, improved with follow-up   Chronic kidney disease    COPD (chronic obstructive pulmonary disease) (HCC)    Deep vein thrombosis (DVT) of lower extremity (HCC) 08/17/2015   DVT (  deep venous thrombosis) (HCC)    Gout    Hypertension    Hypertensive cardiovascular disease    Kidney dysfunction    Morbid obesity (HCC)    OSA on CPAP    Paroxysmal atrial fibrillation (HCC)    PNA (pneumonia)    Pulmonary embolism (HCC) 2010   left leg DVT   Sleep apnea    uses cpap   Current Outpatient Medications  Medication Sig Dispense Refill   albuterol (PROAIR HFA) 108 (90 Base) MCG/ACT inhaler Inhale 2 puffs into the lungs every 4 (four) hours as needed for wheezing or shortness of breath. 1 Inhaler 3   albuterol (PROVENTIL) (2.5 MG/3ML) 0.083% nebulizer solution INHALE 1 VIAL VIA NEBULIZER EVERY 6 HOURS AS NEEDED FOR WHEEZING/SHORTNESS OF BREATH. 375 mL 11   allopurinol (ZYLOPRIM) 300 MG tablet Take 1 tablet (300 mg  total) by mouth daily. 90 tablet 1   amiodarone (PACERONE) 200 MG tablet Take 0.5 tablets (100 mg total) by mouth daily. 90 tablet 2   apixaban (ELIQUIS) 5 MG TABS tablet Take 1 tablet (5 mg total) by mouth 2 (two) times daily. 180 tablet 2   budesonide-formoterol (SYMBICORT) 80-4.5 MCG/ACT inhaler Take 2 puffs first thing in am and then another 2 puffs about 12 hours later. 30.6 g 3   calcium carbonate (OS-CAL - DOSED IN MG OF ELEMENTAL CALCIUM) 1250 (500 Ca) MG tablet Take 1 tablet by mouth every 3 (three) days.     carvedilol (COREG) 6.25 MG tablet Take 2 tablets (12.5 mg total) by mouth 2 (two) times daily with a meal. 180 tablet 1   Cholecalciferol (VITAMIN D3) 50 MCG (2000 UT) TABS Take 2,000 Units by mouth daily.     fluticasone (FLONASE) 50 MCG/ACT nasal spray SPRAY 2 SPRAYS INTO EACH NOSTRIL EVERY DAY (Patient taking differently: Place 2 sprays into both nostrils daily as needed for allergies or rhinitis.) 48 g 3   hydrALAZINE (APRESOLINE) 100 MG tablet Take 1 tablet (100 mg total) by mouth every 8 (eight) hours. 90 tablet 0   metolazone (ZAROXOLYN) 5 MG tablet Take 1 tablet (5 mg total) by mouth as directed. As directed by Heart Failure Clinic. 5 tablet 1   tamsulosin (FLOMAX) 0.4 MG CAPS capsule Take 1 capsule (0.4 mg total) by mouth daily. 90 capsule 3   torsemide (DEMADEX) 20 MG tablet Take 3 tablets (60 mg total) by mouth 2 (two) times daily. 540 tablet 3   isosorbide mononitrate (IMDUR) 120 MG 24 hr tablet Take 1 tablet (120 mg total) by mouth daily. (Patient not taking: Reported on 04/24/2023) 90 tablet 3   No current facility-administered medications for this encounter.   Allergies  Allergen Reactions   Other Anaphylaxis    mushrooms    Social History   Socioeconomic History   Marital status: Divorced    Spouse name: Not on file   Number of children: 2   Years of education: 12   Highest education level: Not on file  Occupational History   Occupation: Drives dump truck     Comment:     Occupation: IT sales professional    Comment: Disabled  Tobacco Use   Smoking status: Former    Current packs/day: 0.00    Average packs/day: 0.3 packs/day for 1 year (0.3 ttl pk-yrs)    Types: Cigarettes    Start date: 07/23/1988    Quit date: 07/23/1989    Years since quitting: 33.7   Smokeless tobacco: Never   Tobacco comments:  Former smoker 03/06/2021  Vaping Use   Vaping status: Never Used  Substance and Sexual Activity   Alcohol use: No    Alcohol/week: 0.0 standard drinks of alcohol   Drug use: No   Sexual activity: Not Currently  Other Topics Concern   Not on file  Social History Narrative   Pt lives in [place], alone.   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   Has 2 sons in [place]      No living will   Would want sons to make decisions for him   Would accept resuscitation   Social Drivers of Health   Financial Resource Strain: Low Risk  (01/19/2018)   Overall Financial Resource Strain (CARDIA)    Difficulty of Paying Living Expenses: Not hard at all  Food Insecurity: No Food Insecurity (04/13/2023)   Hunger Vital Sign    Worried About Running Out of Food in the Last Year: Never true    Ran Out of Food in the Last Year: Never true  Transportation Needs: No Transportation Needs (04/13/2023)   PRAPARE - Administrator, Civil Service (Medical): No    Lack of Transportation (Non-Medical): No  Physical Activity: Not on file  Stress: Not on file  Social Connections: Not on file  Intimate Partner Violence: Not At Risk (04/13/2023)   Humiliation, Afraid, Rape, and Kick questionnaire    Fear of Current or Ex-Partner: No    Emotionally Abused: No    Physically Abused: No    Sexually Abused: No    Family History  Problem Relation Age of Onset   Hypertension Mother    Diabetes Father    CAD Father    Clotting disorder Father    Heart disease Father        transplant   Stomach cancer Father    Allergies  Brother    Colon cancer Neg Hx    Colon polyps Neg Hx    Esophageal cancer Neg Hx    Rectal cancer Neg Hx    BP 120/66   Pulse 89   Wt (!) 139.3 kg (307 lb 3.2 oz)   SpO2 98%   BMI 45.37 kg/m   Wt Readings from Last 3 Encounters:  04/24/23 (!) 139.3 kg (307 lb 3.2 oz)  04/16/23 (!) 137.8 kg (303 lb 12.8 oz)  04/15/23 (!) 136.8 kg (301 lb 9.6 oz)   PHYSICAL EXAM: General:  NAD. No resp difficulty, walked into clinic HEENT: Normal Neck: Supple. JVP to jaw Cor: Irregular rate & rhythm. No rubs, gallops or murmurs. Lungs: Clear, diminished Abdomen: Soft, obese, nontender, nondistended.  Extremities: No cyanosis, clubbing, rash, edema Neuro: Alert & oriented x 3, cranial nerves grossly intact. Moves all 4 extremities w/o difficulty. Affect pleasant.  ECG (personally reviewed): atrial fibrillation 74 bpm  ASSESSMENT & PLAN: 1. Chronic HFmEF - Echo 11/2016 LVEF 55-60%, Grade 2 DD. - EF 7/20 back down to 30%. Suspect due to AF but rate not overly elevated. HTN may also be playing a role. - Symptoms improved after DC-CV on 10/11/18  - Echo 11/20 EF 45-50% - Echo 5/21 EF 45-50% - Echo 9/22 EF 45-50% - Echo 12/23 EF 40-45%, global LV HK, RV ok. - Echo 1/25: EF 35-40%, RV ok - NYHA I-II, volume up today, likely 2/2 to atrial fibrillation. GDMT limited by CKD. - Take metolazone 2.5 mg + 40 KCL x 1 today. - Restart Imdur at lower dose of 60 mg  daily. - Continue torsemide 60 mg bid + 40 KCL bid. - Continue Coreg 12.5 mg bid. - Continue hydralazine 100 mg tid. - Off Farxiga due to AKI and refuses to restart due to cost. - Has not had cath due to CKD and lack of clear anginal symptoms.  - No ACE/ARB with CKD (creatinine has run 2.5-3.3) - Continue compression hose. - Labs today. - Repeat echo in a few months once SR restored.  2. PAF / Typical A-flutter, paroxysmal - S/P AFL ablation 07/2017. - s/p DC-CV for AFib on 10/14/18 and 05/10/19 and 9/22 - Amio started 9/22 - S/p AF  ablation 7/23 by Dr. Deno Lunger - He is compliant with CPAP. - Unfortunately in AF today, rate controlled. - Increase amio to 200 mg bid x 2 weeks, then 200 mg daily. - Continue Eliquis 5 mg bid. He has not missed any doses. Given young age would like to avoid long-term exposure to [facility].  - Saw EP, not an ablation candidate until weight closer to 250 lbs. - Informed Consent   Shared Decision Making/Informed Consent The risks (stroke, cardiac arrhythmias rarely resulting in the need for a temporary or permanent pacemaker, skin irritation or burns and complications associated with conscious sedation including aspiration, arrhythmia, respiratory failure and death), benefits (restoration of normal sinus rhythm) and alternatives of a direct current cardioversion were explained in detail to Mr. Dauzat and he agrees to proceed.      3. Hypertension - BP stable - Restart Imdur as aove  4. Morbid obesity - Body mass index is 45.37 kg/m.  - He has lost over 100 pounds. Congratulated him.  - Has not been interested in GLP1RA.  5. CKD IV - Baseline SCr ~2.5 -3.0  - Follows with Dr. Thedore Mins.  - Labs today  6. OSA on CPAP - Compliant with CPAP.  - No change  7. Iron deficiency anemia - Getting Venofer  - Denies recent bleeding issues - Followed by Heme/Onc  Arrange for DCCV with Dr. Gala Romney, follow up with APP 2 weeks after.  Jacklynn Ganong, FNP  9:50 AM

## 2023-04-22 NOTE — H&P (View-Only) (Signed)
 Advanced Heart Failure Clinic Note   PCP: Karie Schwalbe, MD Primary Cardiologist: Dr. Katrinka Blazing  EP: Dr Allred Nephrology: Dr. Thedore Mins HF Cardiologist: Dr Gala Romney   CC: HF follow up  HPI: Phillip [name] is a 62 y.o. male hx of PAF, remote PE, hx of DVT, OSA, morbid obesity, CKD Stage III,  HTN, and  combined chronic systolic/diastolic. S/P A Flutter ablation May 2019 .     EF in 2015 40-45% Echo 12/21/16 with EF 55-60% G2DD, mild aortic regurgitation.  Last nuc 2008 no ischemia, no hx of cath due to CKD.    Developed AFL and underwent AFL ablation in 5/19.  Seen 7/20 and was feeling bad. Found to be in AFL/AF in 80s. Echo at that time with EF back down to 30%. Underwent DC-CV on 10/14/18 and referred to AF Clinic.   Echo 11/20 EF 45-50%  Developed recurrent AF at the end of January 2021. Underwent attempt at [facility] on 04/22/19 but failed to convert. Seen back in AF Clinic on 2/5. Not able to use Tikosyn due to long QT. Not ablation candidate due to obesity. Considered amiodarone. Started on flecainide but had treadmill to monitor Flecainide Tx.   Patient had significant QRS widening within first minute of exercise.  Flecainide stopped  Echo 5/21 EF 45-50%  Recurrent AF in 9/22. Admitted. Had DC-CV. Amiodarone started.   Echo 9/22 EF 45-50% Marked LAE   Admitted 1/23 with a/c CHF and afib RVR. Echo EF 40-45%, global LV HK, RV ok. Diuresed and underwent DCCV.   Had AF ablation by Dr. Magdalene River at [facility] 07/23. Saw Afib clinic 08/23 and in rate controlled AF. Worse 7 day monitor - rhythm was sinus with 14% AF burden.  Seen in ED 01/12/22 with a/c CHF. States he had been drinking a lot of fluid. Given 80 mg IV lasix. He diuresed well and was felt to be stable for discharge home.   Follow up 11/23, NYHA II and volume stable. Saw EP 1/24, not ablation candidate yet due to weight. Continue amiodarone.  Admitted 1/25 with a/c HF. Echo 1/25 EF 35-40%, mild LVH, G2DD, RV normal. GDMT titrated and  he was discharged home, weight 310 lbs.  Today he returns for HF follow up. We have not seen him since 06/2022. Overall feeling fine. Has a URI so mildly SOB, but otherwise doing well. He has SOB climbing steps. Denies palpitations, CP, dizziness, edema, or PND/Orthopnea. Appetite ok. No fever or chills. Weight at home 296 pounds. Taking all medications. Has not needed to take metolazone. Wears CPAP, no ETOH or tobacco use. Works driving a dump truck. First grandbaby (girl) due 05/2023.   Cardiac Studies: - Echo 1/25: EF 35-40%  - Last nuc 2008 no ischemia  - Echo 2015 EF 40-45%  - Echo 12/21/16 with EF 55-60% G2DD, mild aortic regurgitation.    - Echo 7/20 EF 30%  - Echo 11/20 EF 45-50%  - Echo 5/21 EF 45-50%  - Echo 9/22 EF 45-50% Marked LAE   - Echo 12/23 EF 40-45%, global LV HK, RV ok.   Review of systems complete and found to be negative unless listed in HPI.    Past Medical History:  Diagnosis Date   Asthma    CHF (congestive heart failure) (HCC) 11/15   EF 40-45%, improved with follow-up   Chronic kidney disease    COPD (chronic obstructive pulmonary disease) (HCC)    Deep vein thrombosis (DVT) of lower extremity (HCC) 08/17/2015   DVT (  deep venous thrombosis) (HCC)    Gout    Hypertension    Hypertensive cardiovascular disease    Kidney dysfunction    Morbid obesity (HCC)    OSA on CPAP    Paroxysmal atrial fibrillation (HCC)    PNA (pneumonia)    Pulmonary embolism (HCC) 2010   left leg DVT   Sleep apnea    uses cpap   Current Outpatient Medications  Medication Sig Dispense Refill   albuterol (PROAIR HFA) 108 (90 Base) MCG/ACT inhaler Inhale 2 puffs into the lungs every 4 (four) hours as needed for wheezing or shortness of breath. 1 Inhaler 3   albuterol (PROVENTIL) (2.5 MG/3ML) 0.083% nebulizer solution INHALE 1 VIAL VIA NEBULIZER EVERY 6 HOURS AS NEEDED FOR WHEEZING/SHORTNESS OF BREATH. 375 mL 11   allopurinol (ZYLOPRIM) 300 MG tablet Take 1 tablet (300 mg  total) by mouth daily. 90 tablet 1   amiodarone (PACERONE) 200 MG tablet Take 0.5 tablets (100 mg total) by mouth daily. 90 tablet 2   apixaban (ELIQUIS) 5 MG TABS tablet Take 1 tablet (5 mg total) by mouth 2 (two) times daily. 180 tablet 2   budesonide-formoterol (SYMBICORT) 80-4.5 MCG/ACT inhaler Take 2 puffs first thing in am and then another 2 puffs about 12 hours later. 30.6 g 3   calcium carbonate (OS-CAL - DOSED IN MG OF ELEMENTAL CALCIUM) 1250 (500 Ca) MG tablet Take 1 tablet by mouth every 3 (three) days.     carvedilol (COREG) 6.25 MG tablet Take 2 tablets (12.5 mg total) by mouth 2 (two) times daily with a meal. 180 tablet 1   Cholecalciferol (VITAMIN D3) 50 MCG (2000 UT) TABS Take 2,000 Units by mouth daily.     fluticasone (FLONASE) 50 MCG/ACT nasal spray SPRAY 2 SPRAYS INTO EACH NOSTRIL EVERY DAY (Patient taking differently: Place 2 sprays into both nostrils daily as needed for allergies or rhinitis.) 48 g 3   hydrALAZINE (APRESOLINE) 100 MG tablet Take 1 tablet (100 mg total) by mouth every 8 (eight) hours. 90 tablet 0   metolazone (ZAROXOLYN) 5 MG tablet Take 1 tablet (5 mg total) by mouth as directed. As directed by Heart Failure Clinic. 5 tablet 1   tamsulosin (FLOMAX) 0.4 MG CAPS capsule Take 1 capsule (0.4 mg total) by mouth daily. 90 capsule 3   torsemide (DEMADEX) 20 MG tablet Take 3 tablets (60 mg total) by mouth 2 (two) times daily. 540 tablet 3   isosorbide mononitrate (IMDUR) 120 MG 24 hr tablet Take 1 tablet (120 mg total) by mouth daily. (Patient not taking: Reported on 04/24/2023) 90 tablet 3   No current facility-administered medications for this encounter.   Allergies  Allergen Reactions   Other Anaphylaxis    mushrooms    Social History   Socioeconomic History   Marital status: Divorced    Spouse name: Not on file   Number of children: 2   Years of education: 12   Highest education level: Not on file  Occupational History   Occupation: Drives dump truck     Comment:     Occupation: IT sales professional    Comment: Disabled  Tobacco Use   Smoking status: Former    Current packs/day: 0.00    Average packs/day: 0.3 packs/day for 1 year (0.3 ttl pk-yrs)    Types: Cigarettes    Start date: 07/23/1988    Quit date: 07/23/1989    Years since quitting: 33.7   Smokeless tobacco: Never   Tobacco comments:  Former smoker 03/06/2021  Vaping Use   Vaping status: Never Used  Substance and Sexual Activity   Alcohol use: No    Alcohol/week: 0.0 standard drinks of alcohol   Drug use: No   Sexual activity: Not Currently  Other Topics Concern   Not on file  Social History Narrative   Pt lives in [place], alone.   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   Has 2 sons in [place]      No living will   Would want sons to make decisions for him   Would accept resuscitation   Social Drivers of Health   Financial Resource Strain: Low Risk  (01/19/2018)   Overall Financial Resource Strain (CARDIA)    Difficulty of Paying Living Expenses: Not hard at all  Food Insecurity: No Food Insecurity (04/13/2023)   Hunger Vital Sign    Worried About Running Out of Food in the Last Year: Never true    Ran Out of Food in the Last Year: Never true  Transportation Needs: No Transportation Needs (04/13/2023)   PRAPARE - Administrator, Civil Service (Medical): No    Lack of Transportation (Non-Medical): No  Physical Activity: Not on file  Stress: Not on file  Social Connections: Not on file  Intimate Partner Violence: Not At Risk (04/13/2023)   Humiliation, Afraid, Rape, and Kick questionnaire    Fear of Current or Ex-Partner: No    Emotionally Abused: No    Physically Abused: No    Sexually Abused: No    Family History  Problem Relation Age of Onset   Hypertension Mother    Diabetes Father    CAD Father    Clotting disorder Father    Heart disease Father        transplant   Stomach cancer Father    Allergies  Brother    Colon cancer Neg Hx    Colon polyps Neg Hx    Esophageal cancer Neg Hx    Rectal cancer Neg Hx    BP 120/66   Pulse 89   Wt (!) 139.3 kg (307 lb 3.2 oz)   SpO2 98%   BMI 45.37 kg/m   Wt Readings from Last 3 Encounters:  04/24/23 (!) 139.3 kg (307 lb 3.2 oz)  04/16/23 (!) 137.8 kg (303 lb 12.8 oz)  04/15/23 (!) 136.8 kg (301 lb 9.6 oz)   PHYSICAL EXAM: General:  NAD. No resp difficulty, walked into clinic HEENT: Normal Neck: Supple. JVP to jaw Cor: Irregular rate & rhythm. No rubs, gallops or murmurs. Lungs: Clear, diminished Abdomen: Soft, obese, nontender, nondistended.  Extremities: No cyanosis, clubbing, rash, edema Neuro: Alert & oriented x 3, cranial nerves grossly intact. Moves all 4 extremities w/o difficulty. Affect pleasant.  ECG (personally reviewed): atrial fibrillation 74 bpm  ASSESSMENT & PLAN: 1. Chronic HFmEF - Echo 11/2016 LVEF 55-60%, Grade 2 DD. - EF 7/20 back down to 30%. Suspect due to AF but rate not overly elevated. HTN may also be playing a role. - Symptoms improved after DC-CV on 10/11/18  - Echo 11/20 EF 45-50% - Echo 5/21 EF 45-50% - Echo 9/22 EF 45-50% - Echo 12/23 EF 40-45%, global LV HK, RV ok. - Echo 1/25: EF 35-40%, RV ok - NYHA I-II, volume up today, likely 2/2 to atrial fibrillation. GDMT limited by CKD. - Take metolazone 2.5 mg + 40 KCL x 1 today. - Restart Imdur at lower dose of 60 mg  daily. - Continue torsemide 60 mg bid + 40 KCL bid. - Continue Coreg 12.5 mg bid. - Continue hydralazine 100 mg tid. - Off Farxiga due to AKI and refuses to restart due to cost. - Has not had cath due to CKD and lack of clear anginal symptoms.  - No ACE/ARB with CKD (creatinine has run 2.5-3.3) - Continue compression hose. - Labs today. - Repeat echo in a few months once SR restored.  2. PAF / Typical A-flutter, paroxysmal - S/P AFL ablation 07/2017. - s/p DC-CV for AFib on 10/14/18 and 05/10/19 and 9/22 - Amio started 9/22 - S/p AF  ablation 7/23 by Dr. Deno Lunger - He is compliant with CPAP. - Unfortunately in AF today, rate controlled. - Increase amio to 200 mg bid x 2 weeks, then 200 mg daily. - Continue Eliquis 5 mg bid. He has not missed any doses. Given young age would like to avoid long-term exposure to [facility].  - Saw EP, not an ablation candidate until weight closer to 250 lbs. - Informed Consent   Shared Decision Making/Informed Consent The risks (stroke, cardiac arrhythmias rarely resulting in the need for a temporary or permanent pacemaker, skin irritation or burns and complications associated with conscious sedation including aspiration, arrhythmia, respiratory failure and death), benefits (restoration of normal sinus rhythm) and alternatives of a direct current cardioversion were explained in detail to Mr. Dauzat and he agrees to proceed.      3. Hypertension - BP stable - Restart Imdur as aove  4. Morbid obesity - Body mass index is 45.37 kg/m.  - He has lost over 100 pounds. Congratulated him.  - Has not been interested in GLP1RA.  5. CKD IV - Baseline SCr ~2.5 -3.0  - Follows with Dr. Thedore Mins.  - Labs today  6. OSA on CPAP - Compliant with CPAP.  - No change  7. Iron deficiency anemia - Getting Venofer  - Denies recent bleeding issues - Followed by Heme/Onc  Arrange for DCCV with Dr. Gala Romney, follow up with APP 2 weeks after.  Jacklynn Ganong, FNP  9:50 AM

## 2023-04-24 ENCOUNTER — Encounter (HOSPITAL_COMMUNITY): Payer: Self-pay

## 2023-04-24 ENCOUNTER — Other Ambulatory Visit (HOSPITAL_COMMUNITY): Payer: Self-pay

## 2023-04-24 ENCOUNTER — Ambulatory Visit (HOSPITAL_COMMUNITY)
Admission: RE | Admit: 2023-04-24 | Discharge: 2023-04-24 | Disposition: A | Discharge status: Home / Self Care | Payer: Managed Care, Other (non HMO) | Source: Ambulatory Visit | Attending: Family Medicine

## 2023-04-24 VITALS — BP 120/66 | HR 89 | Wt 307.2 lb

## 2023-04-24 DIAGNOSIS — D631 Anemia in chronic kidney disease: Secondary | ICD-10-CM | POA: Insufficient documentation

## 2023-04-24 DIAGNOSIS — I5042 Chronic combined systolic (congestive) and diastolic (congestive) heart failure: Secondary | ICD-10-CM | POA: Insufficient documentation

## 2023-04-24 DIAGNOSIS — G4733 Obstructive sleep apnea (adult) (pediatric): Secondary | ICD-10-CM | POA: Insufficient documentation

## 2023-04-24 DIAGNOSIS — R9431 Abnormal electrocardiogram [ECG] [EKG]: Secondary | ICD-10-CM | POA: Insufficient documentation

## 2023-04-24 DIAGNOSIS — N184 Chronic kidney disease, stage 4 (severe): Secondary | ICD-10-CM | POA: Insufficient documentation

## 2023-04-24 DIAGNOSIS — Z86718 Personal history of other venous thrombosis and embolism: Secondary | ICD-10-CM | POA: Insufficient documentation

## 2023-04-24 DIAGNOSIS — D509 Iron deficiency anemia, unspecified: Secondary | ICD-10-CM | POA: Insufficient documentation

## 2023-04-24 DIAGNOSIS — I447 Left bundle-branch block, unspecified: Secondary | ICD-10-CM | POA: Insufficient documentation

## 2023-04-24 DIAGNOSIS — I130 Hypertensive heart and chronic kidney disease with heart failure and stage 1 through stage 4 chronic kidney disease, or unspecified chronic kidney disease: Secondary | ICD-10-CM | POA: Insufficient documentation

## 2023-04-24 DIAGNOSIS — I4819 Other persistent atrial fibrillation: Secondary | ICD-10-CM | POA: Diagnosis not present

## 2023-04-24 DIAGNOSIS — Z86711 Personal history of pulmonary embolism: Secondary | ICD-10-CM | POA: Insufficient documentation

## 2023-04-24 DIAGNOSIS — I5022 Chronic systolic (congestive) heart failure: Secondary | ICD-10-CM

## 2023-04-24 DIAGNOSIS — I10 Essential (primary) hypertension: Secondary | ICD-10-CM | POA: Diagnosis not present

## 2023-04-24 DIAGNOSIS — I483 Typical atrial flutter: Secondary | ICD-10-CM | POA: Insufficient documentation

## 2023-04-24 DIAGNOSIS — Z6841 Body Mass Index (BMI) 40.0 and over, adult: Secondary | ICD-10-CM | POA: Insufficient documentation

## 2023-04-24 DIAGNOSIS — I480 Paroxysmal atrial fibrillation: Secondary | ICD-10-CM | POA: Diagnosis present

## 2023-04-24 LAB — CBC
HCT: 37.8 % — ABNORMAL LOW (ref 39.0–52.0)
Hemoglobin: 11.9 g/dL — ABNORMAL LOW (ref 13.0–17.0)
MCH: 29.4 pg (ref 26.0–34.0)
MCHC: 31.5 g/dL (ref 30.0–36.0)
MCV: 93.3 fL (ref 80.0–100.0)
Platelets: 121 10*3/uL — ABNORMAL LOW (ref 150–400)
RBC: 4.05 MIL/uL — ABNORMAL LOW (ref 4.22–5.81)
RDW: 15.7 % — ABNORMAL HIGH (ref 11.5–15.5)
WBC: 3.2 10*3/uL — ABNORMAL LOW (ref 4.0–10.5)
nRBC: 0 % (ref 0.0–0.2)

## 2023-04-24 LAB — BASIC METABOLIC PANEL
Anion gap: 9 (ref 5–15)
BUN: 47 mg/dL — ABNORMAL HIGH (ref 6–20)
CO2: 31 mmol/L (ref 22–32)
Calcium: 9.3 mg/dL (ref 8.9–10.3)
Chloride: 103 mmol/L (ref 98–111)
Creatinine, Ser: 3.26 mg/dL — ABNORMAL HIGH (ref 0.61–1.24)
GFR, Estimated: 21 mL/min — ABNORMAL LOW (ref >60)
Glucose, Bld: 83 mg/dL (ref 70–99)
Potassium: 4.4 mmol/L (ref 3.5–5.1)
Sodium: 143 mmol/L (ref 135–145)

## 2023-04-24 LAB — BRAIN NATRIURETIC PEPTIDE: B Natriuretic Peptide: 1123 pg/mL — ABNORMAL HIGH (ref 0.0–100.0)

## 2023-04-24 MED ORDER — AMIODARONE HCL 200 MG PO TABS: 200.0000 mg | ORAL_TABLET | Freq: Two times a day (BID) | ORAL | 0 refills | Status: DC

## 2023-04-24 MED ORDER — AMIODARONE HCL 200 MG PO TABS: 200.0000 mg | ORAL_TABLET | Freq: Every day | ORAL | 3 refills | Status: DC

## 2023-04-24 MED ORDER — METOLAZONE 2.5 MG PO TABS: 2.5000 mg | ORAL_TABLET | ORAL | 0 refills | Status: DC

## 2023-04-24 MED ORDER — ISOSORBIDE MONONITRATE ER 60 MG PO TB24: 60.0000 mg | ORAL_TABLET | Freq: Every day | ORAL | 3 refills | Status: DC

## 2023-04-24 NOTE — Telephone Encounter (Signed)
Copied from CRM [id number]. Topic: Appointments - Appointment Scheduling >> Apr 24, 2023  2:50 PM [place] A wrote: Patient has Sinus Infection- sneezing, nasal congestion, and pressure in his ears-Patient would like to know if he can take Coricidin? Please Call Patient

## 2023-04-24 NOTE — Telephone Encounter (Signed)
Spoke to pt. Advised him he could get some Coricidn. Also suggested he make a virtual appt through MyChart. He set up one for tomorrow at 1115.

## 2023-04-24 NOTE — Patient Instructions (Signed)
Thank you for coming in today  If you had labs drawn today, any labs that are abnormal the clinic will call you No news is good news  Medications: Increase Amiodarone 200 mg 1 tablet twice daily for 14 days Then 05/09/2023 Amiodarone 200 mg 1 tablet daily Restart imdur 60 mg 1 tablet daily  Metolazone 2.5 mg with 40 meq of potassium today only    You are scheduled for a TEE/Cardioversion/TEE Cardioversion on February 17th with Dr. Gala Romney.  Please arrive at the [facility] (Main Entrance A) at [facility]: [address] [place], [place] [address] at 8:30 am. (1 hour prior to procedure unless lab work is needed; if lab work is needed arrive 1.5 hours ahead)  DIET: Nothing to eat or drink after midnight except a sip of water with medications (see medication instructions below)  Medication Instructions: Hold   Continue your anticoagulant: Eliquis You will need to continue your anticoagulant after your procedure until you  are told by your  Provider that it is safe to stop     You must have a responsible person to drive you home and stay in the waiting area during your procedure. Failure to do so could result in cancellation.  Bring your insurance cards.  *Special Note: Every effort is made to have your procedure done on time. Occasionally there are emergencies that occur at the hospital that may cause delays. Please be patient if a delay does occur.    Follow up appointments:  Your physician recommends that you schedule a follow-up appointment in:  2 weeks With Dr. Gala Romney after cardioversion    Do the following things EVERYDAY: Weigh yourself in the morning before breakfast. Write it down and keep it in a log. Take your medicines as prescribed Eat low salt foods--Limit salt (sodium) to 2000 mg per day.  Stay as active as you can everyday Limit all fluids for the day to less than 2 liters   At the [facility], you and your health needs are  our priority. As part of our continuing mission to provide you with exceptional heart care, we have created designated Provider Care Teams. These Care Teams include your primary Cardiologist (physician) and Advanced Practice Providers (APPs- Physician Assistants and Nurse Practitioners) who all work together to provide you with the care you need, when you need it.   You may see any of the following providers on your designated Care Team at your next follow up: Dr Arvilla Meres Dr Marca Ancona Dr. Marcos Eke, NP Robbie Lis, [place] [facility] [place], [place] Brynda Peon, NP Karle Plumber, PharmD   Please be sure to bring in all your medications bottles to every appointment.    Thank you for choosing [facility]  If you have any questions or concerns before your next appointment please send [place] a message through [place] or call our office at [phone].    TO LEAVE A MESSAGE FOR THE NURSE SELECT OPTION 2, PLEASE LEAVE A MESSAGE INCLUDING: YOUR NAME DATE OF BIRTH CALL BACK NUMBER REASON FOR CALL**this is important as we prioritize the call backs  YOU WILL RECEIVE A CALL BACK THE SAME DAY AS LONG AS YOU CALL BEFORE 4:00 PM

## 2023-04-25 ENCOUNTER — Telehealth: Payer: Managed Care, Other (non HMO) | Admitting: Family Medicine

## 2023-04-25 DIAGNOSIS — J40 Bronchitis, not specified as acute or chronic: Secondary | ICD-10-CM | POA: Diagnosis not present

## 2023-04-25 MED ORDER — AZITHROMYCIN 250 MG PO TABS: ORAL_TABLET | ORAL | 0 refills | Status: AC

## 2023-04-25 MED ORDER — BENZONATATE 200 MG PO CAPS: 200.0000 mg | ORAL_CAPSULE | Freq: Two times a day (BID) | ORAL | 0 refills | Status: DC | PRN

## 2023-04-25 NOTE — Progress Notes (Signed)
Virtual Visit Consent   [facility], you are scheduled for a virtual visit with a [facility] provider today. Just as with appointments in the office, your consent must be obtained to participate. Your consent will be active for this visit and any virtual visit you may have with one of our providers in the next 365 days. If you have a MyChart account, a copy of this consent can be sent to you electronically.  As this is a virtual visit, video technology does not allow for your provider to perform a traditional examination. This may limit your provider's ability to fully assess your condition. If your provider identifies any concerns that need to be evaluated in person or the need to arrange testing (such as labs, EKG, etc.), we will make arrangements to do so. Although advances in technology are sophisticated, we cannot ensure that it will always work on either your end or our end. If the connection with a video visit is poor, the visit may have to be switched to a telephone visit. With either a video or telephone visit, we are not always able to ensure that we have a secure connection.  By engaging in this virtual visit, you consent to the provision of healthcare and authorize for your insurance to be billed (if applicable) for the services provided during this visit. Depending on your insurance coverage, you may receive a charge related to this service.  I need to obtain your verbal consent now. Are you willing to proceed with your visit today? Phillip [name] has provided verbal consent on 04/25/2023 for a virtual visit (video or telephone). Georgana Curio, FNP  Date: 04/25/2023 5:17 PM  Virtual Visit via Video Note   I, Georgana Curio, connected with  Phillip [name]  ([id number], 1960-11-12) on 04/25/23 at  5:15 PM EST by a video-enabled telemedicine application and verified that I am speaking with the correct person using two identifiers.  Location: Patient: Virtual Visit Location Patient: Home Provider: Virtual  Visit Location Provider: Home Office   I discussed the limitations of evaluation and management by telemedicine and the availability of in person appointments. The patient expressed understanding and agreed to proceed.    History of Present Illness: Phillip [name] is a 63 y.o. who identifies as a male who was assigned male at birth, and is being seen today for cough with yellow mucus worsening since Monday. He has afib and sleep apnea. Body aches and chills. No fever. No wheezing or sob. [name]  HPI: HPI  Problems:  Patient Active Problem List   Diagnosis Date Noted   Acute on chronic systolic CHF (congestive heart failure) (HCC) 04/14/2023   Acute CHF (congestive heart failure) (HCC) 04/13/2023   Acute on chronic combined systolic and diastolic CHF (congestive heart failure) (HCC) 04/13/2023   Encounter for monitoring amiodarone therapy 04/02/2023   Abnormal pigmentation of conjunctiva 02/27/2023   Benign enlargement of prostate 02/27/2023   Hypercoagulable state due to persistent atrial fibrillation (HCC) 09/29/2022   Aortic regurgitation 08/12/2022   Anemia of chronic renal failure 07/29/2021   Thrombocytopenia (HCC) 07/29/2021   Leucopenia 07/29/2021   B12 deficiency 07/29/2021   Asthmatic bronchitis 02/26/2021   Preventative health care 03/09/2020   Benign neoplasm of ascending colon    Hyperlipidemia LDL goal <70 05/15/2019   Persistent atrial fibrillation (HCC)    Cough 10/15/2018   Asthma with COPD (HCC)    Chronic diastolic heart failure (HCC)    Anticoagulated 08/17/2015   Hx pulmonary embolism 08/17/2015  Paroxysmal atrial fibrillation (HCC) 08/04/2015   SVT (supraventricular tachycardia) (HCC) 06/22/2015   Morbid obesity (HCC) 03/07/2015   Chronic combined systolic and diastolic CHF (congestive heart failure) (HCC) 03/07/2015   Essential hypertension    Gout    Mild persistent asthma in adult without complication 05/06/2014   OSA on CPAP 05/06/2014   Chronic kidney  disease, stage IV (severe) (HCC) 05/06/2014    Allergies:  Allergies  Allergen Reactions   Other Anaphylaxis    mushrooms   Medications:  Current Outpatient Medications:    albuterol (PROAIR HFA) 108 (90 Base) MCG/ACT inhaler, Inhale 2 puffs into the lungs every 4 (four) hours as needed for wheezing or shortness of breath., Disp: 1 Inhaler, Rfl: 3   albuterol (PROVENTIL) (2.5 MG/3ML) 0.083% nebulizer solution, INHALE 1 VIAL VIA NEBULIZER EVERY 6 HOURS AS NEEDED FOR WHEEZING/SHORTNESS OF BREATH., Disp: 375 mL, Rfl: 11   allopurinol (ZYLOPRIM) 300 MG tablet, Take 1 tablet (300 mg total) by mouth daily., Disp: 90 tablet, Rfl: 1   amiodarone (PACERONE) 200 MG tablet, Take 1 tablet (200 mg total) by mouth 2 (two) times daily for 14 days., Disp: 28 tablet, Rfl: 0   [START ON 05/09/2023] amiodarone (PACERONE) 200 MG tablet, Take 1 tablet (200 mg total) by mouth daily., Disp: 90 tablet, Rfl: 3   apixaban (ELIQUIS) 5 MG TABS tablet, Take 1 tablet (5 mg total) by mouth 2 (two) times daily., Disp: 180 tablet, Rfl: 2   budesonide-formoterol (SYMBICORT) 80-4.5 MCG/ACT inhaler, Take 2 puffs first thing in am and then another 2 puffs about 12 hours later., Disp: 30.6 g, Rfl: 3   calcium carbonate (OS-CAL - DOSED IN MG OF ELEMENTAL CALCIUM) 1250 (500 Ca) MG tablet, Take 1 tablet by mouth every 3 (three) days., Disp: , Rfl:    carvedilol (COREG) 6.25 MG tablet, Take 2 tablets (12.5 mg total) by mouth 2 (two) times daily with a meal., Disp: 180 tablet, Rfl: 1   Cholecalciferol (VITAMIN D3) 50 MCG (2000 UT) TABS, Take 2,000 Units by mouth daily., Disp: , Rfl:    fluticasone (FLONASE) 50 MCG/ACT nasal spray, SPRAY 2 SPRAYS INTO EACH NOSTRIL EVERY DAY (Patient taking differently: Place 2 sprays into both nostrils daily as needed for allergies or rhinitis.), Disp: 48 g, Rfl: 3   hydrALAZINE (APRESOLINE) 100 MG tablet, Take 1 tablet (100 mg total) by mouth every 8 (eight) hours., Disp: 90 tablet, Rfl: 0   isosorbide  mononitrate (IMDUR) 60 MG 24 hr tablet, Take 1 tablet (60 mg total) by mouth daily., Disp: 90 tablet, Rfl: 3   metolazone (ZAROXOLYN) 2.5 MG tablet, Take 1 tablet (2.5 mg total) by mouth as directed. Take 2.5 mg today only with 40 meq of Potassium, Disp: 1 tablet, Rfl: 0   [START ON 04/27/2023] metolazone (ZAROXOLYN) 2.5 MG tablet, Take 1 tablet (2.5 mg total) by mouth as directed for 1 day., Disp: 1 tablet, Rfl: 0   potassium chloride SA (KLOR-CON M) 20 MEQ tablet, Take 40 mEq by mouth 2 (two) times daily., Disp: , Rfl:    tamsulosin (FLOMAX) 0.4 MG CAPS capsule, Take 1 capsule (0.4 mg total) by mouth daily., Disp: 90 capsule, Rfl: 3   torsemide (DEMADEX) 20 MG tablet, Take 3 tablets (60 mg total) by mouth 2 (two) times daily., Disp: 540 tablet, Rfl: 3  Observations/Objective: Patient is well-developed, well-nourished in no acute distress.  Resting comfortably  at home.  Head is normocephalic, atraumatic.  No labored breathing.  Speech is clear and  coherent with logical content.  Patient is alert and oriented at baseline.    Assessment and Plan: There are no diagnoses linked to this encounter. Increase fluids, humidification on cpap, UC if sx worsen.   Follow Up Instructions: I discussed the assessment and treatment plan with the patient. The patient was provided an opportunity to ask questions and all were answered. The patient agreed with the plan and demonstrated an understanding of the instructions.  A copy of instructions were sent to the patient via MyChart unless otherwise noted below.     The patient was advised to call back or seek an in-person evaluation if the symptoms worsen or if the condition fails to improve as anticipated.    Georgana Curio, FNP [place]

## 2023-04-25 NOTE — Patient Instructions (Signed)

## 2023-05-04 ENCOUNTER — Other Ambulatory Visit (HOSPITAL_COMMUNITY): Payer: Self-pay | Admitting: Internal Medicine

## 2023-05-08 ENCOUNTER — Ambulatory Visit (HOSPITAL_COMMUNITY)
Admission: RE | Admit: 2023-05-08 | Discharge: 2023-05-08 | Disposition: A | Discharge status: Home / Self Care | Payer: Managed Care, Other (non HMO) | Source: Ambulatory Visit | Attending: Cardiology | Admitting: Cardiology

## 2023-05-08 ENCOUNTER — Telehealth (HOSPITAL_COMMUNITY): Payer: Self-pay

## 2023-05-08 DIAGNOSIS — I5022 Chronic systolic (congestive) heart failure: Secondary | ICD-10-CM | POA: Diagnosis present

## 2023-05-08 LAB — BASIC METABOLIC PANEL
Anion gap: 8 (ref 5–15)
BUN: 62 mg/dL — ABNORMAL HIGH (ref 6–20)
CO2: 27 mmol/L (ref 22–32)
Calcium: 9.2 mg/dL (ref 8.9–10.3)
Chloride: 105 mmol/L (ref 98–111)
Creatinine, Ser: 3.84 mg/dL — ABNORMAL HIGH (ref 0.61–1.24)
GFR, Estimated: 17 mL/min — ABNORMAL LOW (ref >60)
Glucose, Bld: 94 mg/dL (ref 70–99)
Potassium: 4.1 mmol/L (ref 3.5–5.1)
Sodium: 140 mmol/L (ref 135–145)

## 2023-05-08 LAB — CBC
HCT: 38.6 % — ABNORMAL LOW (ref 39.0–52.0)
Hemoglobin: 12.4 g/dL — ABNORMAL LOW (ref 13.0–17.0)
MCH: 29.3 pg (ref 26.0–34.0)
MCHC: 32.1 g/dL (ref 30.0–36.0)
MCV: 91.3 fL (ref 80.0–100.0)
Platelets: 118 10*3/uL — ABNORMAL LOW (ref 150–400)
RBC: 4.23 MIL/uL (ref 4.22–5.81)
RDW: 15 % (ref 11.5–15.5)
WBC: 2.9 10*3/uL — ABNORMAL LOW (ref 4.0–10.5)
nRBC: 0 % (ref 0.0–0.2)

## 2023-05-08 IMAGING — DX DG CHEST 1V PORT
1 series · 1 of 1 positions shown · non-contrast
Comparison: Radiographs 04/13/2021 and 04/08/2021.  CT 06/20/2015.

CLINICAL DATA: Acute combined systolic and diastolic heart failure.
Cardioversion yesterday.

EXAM:
PORTABLE CHEST 1 VIEW

[chest]
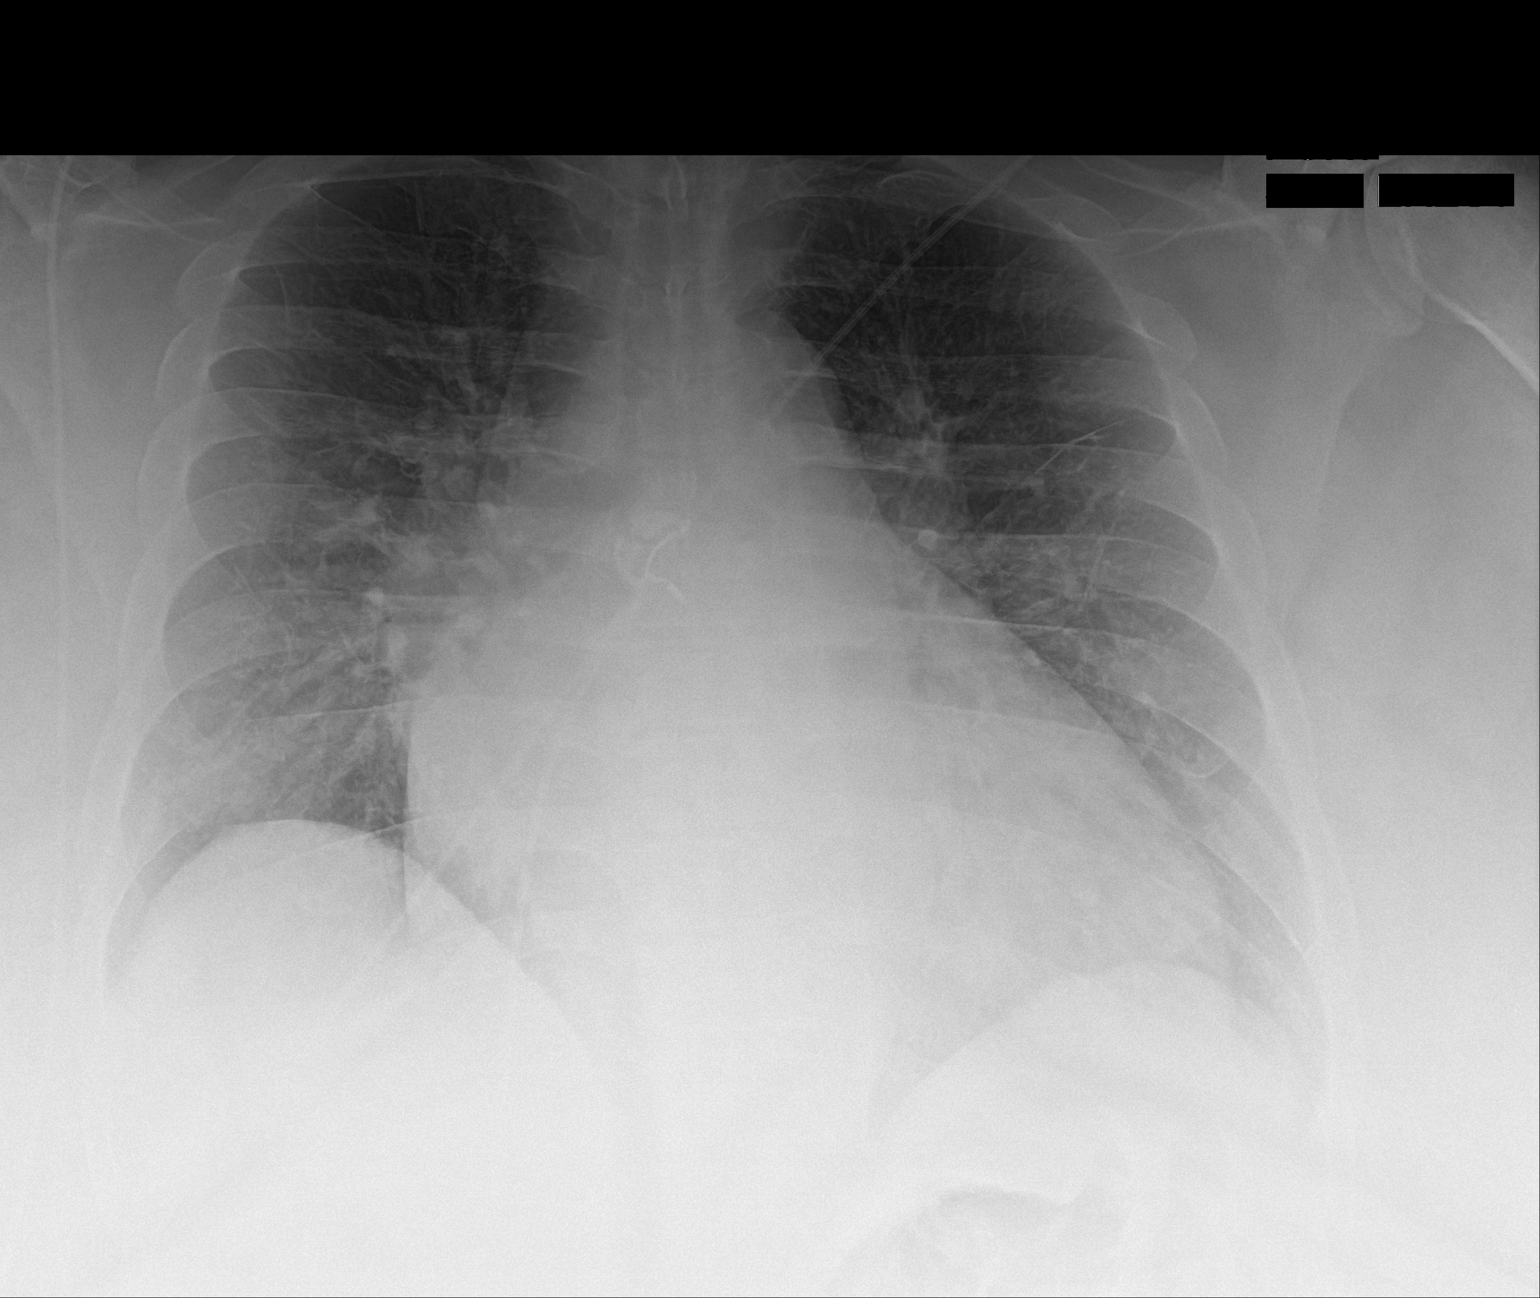

[1 of 1 positions shown; findings below may reference images not displayed]

FINDINGS: 6660 hours. Stable cardiomegaly and chronic vascular congestion. No
overt pulmonary edema, confluent airspace opacity, pneumothorax or
significant pleural effusion identified. The bones appear
unremarkable. Telemetry leads overlie the chest.
IMPRESSION: Stable radiographic appearance of the chest. Chronic cardiomegaly
and mild vascular congestion.

## 2023-05-08 NOTE — Progress Notes (Signed)
Spoke to pt and instructed them to come at 0800 and to be NPO after 0000.  Confirmed no missed doses of AC and instructed to take in AM with a small sip of water.  Confirmed that pt will have a ride home and someone to stay with them for 24 hours after the procedure. Instructed patient to not wear any jewelry or lotion.

## 2023-05-08 NOTE — Telephone Encounter (Addendum)
Pt aware, agreeable, and verbalized understanding  Labs ordered and scheduled   ----- Message from Jacklynn Ganong sent at 05/08/2023 10:16 AM EST ----- Renal function worse after metolazone dose.  Hold torsemide and KCL x 3 days, then resume at home dose of torsemide 60 bid and KCL 40 daily.  Repeat BMET in 1 week

## 2023-05-11 ENCOUNTER — Ambulatory Visit (HOSPITAL_COMMUNITY)
Admission: RE | Admit: 2023-05-11 | Discharge: 2023-05-11 | Disposition: A | Discharge status: Home / Self Care | Payer: Managed Care, Other (non HMO) | Attending: Internal Medicine | Admitting: Internal Medicine

## 2023-05-11 ENCOUNTER — Encounter (HOSPITAL_COMMUNITY): Payer: Self-pay | Admitting: Internal Medicine

## 2023-05-11 ENCOUNTER — Ambulatory Visit (HOSPITAL_COMMUNITY): Payer: Managed Care, Other (non HMO) | Admitting: Anesthesiology

## 2023-05-11 ENCOUNTER — Other Ambulatory Visit: Payer: Self-pay

## 2023-05-11 ENCOUNTER — Encounter (HOSPITAL_COMMUNITY)
Admission: RE | Disposition: A | Discharge status: Home / Self Care | Payer: Self-pay | Source: Home / Self Care | Attending: Internal Medicine

## 2023-05-11 DIAGNOSIS — Z6841 Body Mass Index (BMI) 40.0 and over, adult: Secondary | ICD-10-CM | POA: Insufficient documentation

## 2023-05-11 DIAGNOSIS — I480 Paroxysmal atrial fibrillation: Secondary | ICD-10-CM | POA: Diagnosis present

## 2023-05-11 DIAGNOSIS — I5043 Acute on chronic combined systolic (congestive) and diastolic (congestive) heart failure: Secondary | ICD-10-CM | POA: Diagnosis not present

## 2023-05-11 DIAGNOSIS — Z87891 Personal history of nicotine dependence: Secondary | ICD-10-CM | POA: Diagnosis not present

## 2023-05-11 DIAGNOSIS — I483 Typical atrial flutter: Secondary | ICD-10-CM | POA: Diagnosis not present

## 2023-05-11 DIAGNOSIS — I130 Hypertensive heart and chronic kidney disease with heart failure and stage 1 through stage 4 chronic kidney disease, or unspecified chronic kidney disease: Secondary | ICD-10-CM

## 2023-05-11 DIAGNOSIS — Z79899 Other long term (current) drug therapy: Secondary | ICD-10-CM | POA: Insufficient documentation

## 2023-05-11 DIAGNOSIS — I4891 Unspecified atrial fibrillation: Secondary | ICD-10-CM | POA: Diagnosis not present

## 2023-05-11 DIAGNOSIS — Z86718 Personal history of other venous thrombosis and embolism: Secondary | ICD-10-CM | POA: Insufficient documentation

## 2023-05-11 DIAGNOSIS — N184 Chronic kidney disease, stage 4 (severe): Secondary | ICD-10-CM | POA: Diagnosis not present

## 2023-05-11 DIAGNOSIS — I5042 Chronic combined systolic (congestive) and diastolic (congestive) heart failure: Secondary | ICD-10-CM | POA: Insufficient documentation

## 2023-05-11 DIAGNOSIS — G4733 Obstructive sleep apnea (adult) (pediatric): Secondary | ICD-10-CM | POA: Insufficient documentation

## 2023-05-11 DIAGNOSIS — Z7901 Long term (current) use of anticoagulants: Secondary | ICD-10-CM | POA: Insufficient documentation

## 2023-05-11 DIAGNOSIS — D509 Iron deficiency anemia, unspecified: Secondary | ICD-10-CM | POA: Diagnosis not present

## 2023-05-11 HISTORY — PX: CARDIOVERSION: EP1203

## 2023-05-11 SURGERY — CARDIOVERSION (CATH LAB)
Anesthesia: General

## 2023-05-11 MED ORDER — PROPOFOL 10 MG/ML IV BOLUS
INTRAVENOUS | Status: DC | PRN
  Administered 2023-05-11: 90 mg via INTRAVENOUS

## 2023-05-11 MED ORDER — LIDOCAINE 2% (20 MG/ML) 5 ML SYRINGE
INTRAMUSCULAR | Status: DC | PRN
  Administered 2023-05-11: 60 mg via INTRAVENOUS

## 2023-05-11 MED ORDER — PHENYLEPHRINE 80 MCG/ML (10ML) SYRINGE FOR IV PUSH (FOR BLOOD PRESSURE SUPPORT)
PREFILLED_SYRINGE | INTRAVENOUS | Status: DC | PRN
  Administered 2023-05-11: 160 ug via INTRAVENOUS

## 2023-05-11 MED ORDER — SODIUM CHLORIDE 0.9 % IV SOLN: INTRAVENOUS | Status: DC | PRN

## 2023-05-11 SURGICAL SUPPLY — 1 items: PAD DEFIB RADIO PHYSIO CONN (PAD) ×1 IMPLANT

## 2023-05-11 NOTE — Discharge Instructions (Signed)

## 2023-05-11 NOTE — Anesthesia Preprocedure Evaluation (Addendum)
 Anesthesia Evaluation  Patient identified by MRN, date of birth, ID band Patient awake    Reviewed: Allergy & Precautions, NPO status , Patient's Chart, lab work & pertinent test results  Airway Mallampati: III  TM Distance: >3 FB Neck ROM: Full    Dental no notable dental hx.    Pulmonary asthma , sleep apnea and Continuous Positive Airway Pressure Ventilation , COPD, former smoker   Pulmonary exam normal        Cardiovascular hypertension, Pt. on medications and Pt. on home beta blockers +CHF and + DVT   Rhythm:Irregular Rate:Normal     1. Left ventricular ejection fraction, by estimation, is 35 to 40%. The  left ventricle has moderately decreased function. The left ventricle  demonstrates global hypokinesis. The left ventricular internal cavity size  was severely dilated. There is mild  left ventricular hypertrophy. Left ventricular diastolic parameters are  consistent with Grade II diastolic dysfunction (pseudonormalization).   2. Right ventricular systolic function is normal. The right ventricular  size is normal. Tricuspid regurgitation signal is inadequate for assessing  PA pressure.   3. Left atrial size was severely dilated.   4. The mitral valve is grossly normal. Trivial mitral valve  regurgitation. No evidence of mitral stenosis.   5. Moderate eccentric AI. The aortic valve is tricuspid. Aortic valve  regurgitation is moderate. No aortic stenosis is present.   6. Aortic dilatation noted. There is borderline dilatation of the  ascending aorta, measuring 40 mm.   7. The inferior vena cava is dilated in size with <50% respiratory  variability, suggesting right atrial pressure of 15 mmHg.     Neuro/Psych negative neurological ROS  negative psych ROS   GI/Hepatic negative GI ROS, Neg liver ROS,,,  Endo/Other    Class 4 obesity  Renal/GU   negative genitourinary   Musculoskeletal negative musculoskeletal  ROS (+)    Abdominal Normal abdominal exam  (+)   Peds  Hematology  (+) Blood dyscrasia, anemia   Anesthesia Other Findings   Reproductive/Obstetrics                             Anesthesia Physical Anesthesia Plan  ASA: 4  Anesthesia Plan: General   Post-op Pain Management:    Induction:   PONV Risk Score and Plan: 1  Airway Management Planned: Simple Face Mask and Nasal Cannula  Additional Equipment: None  Intra-op Plan:   Post-operative Plan:   Informed Consent: I have reviewed the patients History and Physical, chart, labs and discussed the procedure including the risks, benefits and alternatives for the proposed anesthesia with the patient or authorized representative who has indicated his/her understanding and acceptance.     Dental advisory given  Plan Discussed with: CRNA  Anesthesia Plan Comments:        Anesthesia Quick Evaluation

## 2023-05-11 NOTE — CV Procedure (Signed)
    DIRECT CURRENT CARDIOVERSION  NAMESheldon [name]   MRN: [id number] DOB:  12/29/1960   ADMIT DATE: 05/11/2023   INDICATIONS: Atrial fibrillation    PROCEDURE:   Informed consent was obtained prior to the procedure. The risks, benefits and alternatives for the procedure were discussed and the patient comprehended these risks. Once an appropriate time out was taken, the patient had the defibrillator pads placed in the anterior and posterior position. The patient then underwent sedation by the anesthesia service. Once an appropriate level of sedation was achieved, the patient received a single biphasic, synchronized 250J shock with prompt conversion to sinus rhythm. No apparent complications.  Arvilla Meres, MD  8:55 AM

## 2023-05-11 NOTE — Interval H&P Note (Signed)
 History and Physical Interval Note:  05/11/2023 8:16 AM  Phillip [name]  has presented today for surgery, with the diagnosis of afib.  The various methods of treatment have been discussed with the patient and family. After consideration of risks, benefits and other options for treatment, the patient has consented to  Procedure(s): CARDIOVERSION (N/A) as a surgical intervention.  The patient's history has been reviewed, patient examined, no change in status, stable for surgery.  I have reviewed the patient's chart and labs.  Questions were answered to the patient's satisfaction.     [name]

## 2023-05-11 NOTE — Anesthesia Postprocedure Evaluation (Signed)
 Anesthesia Post Note  Patient: Phillip [name]  Procedure(s) Performed: CARDIOVERSION     Patient location during evaluation: PACU Anesthesia Type: General Level of consciousness: awake and alert Pain management: pain level controlled Vital Signs Assessment: post-procedure vital signs reviewed and stable Respiratory status: spontaneous breathing, nonlabored ventilation, respiratory function stable and patient connected to nasal cannula oxygen Cardiovascular status: blood pressure returned to baseline and stable Postop Assessment: no apparent nausea or vomiting Anesthetic complications: no   There were no known notable events for this encounter.  Last Vitals:  Vitals:   05/11/23 0850 05/11/23 0900  BP: (!) 142/71 (!) 135/59  Pulse: (!) 45 (!) 44  Resp: 18 19  Temp:    SpO2: 92% 95%    Last Pain:  Vitals:   05/11/23 0739  TempSrc: Temporal  PainSc: 0-No pain                 Earl Lites P Roxan Yamamoto

## 2023-05-11 NOTE — Transfer of Care (Signed)
 Immediate Anesthesia Transfer of Care Note  Patient: Phillip [name]  Procedure(s) Performed: CARDIOVERSION  Patient Location: PACU  Anesthesia Type:MAC  Level of Consciousness: sedated  Airway & Oxygen Therapy: Patient Spontanous Breathing  Post-op Assessment: Report given to RN  Post vital signs: Reviewed and stable  Last Vitals:  Vitals Value Taken Time  BP 124/47   Temp    Pulse 53   Resp    SpO2 94     Last Pain:  Vitals:   05/11/23 0739  TempSrc: Temporal  PainSc: 0-No pain         Complications: There were no known notable events for this encounter.

## 2023-05-11 NOTE — Telephone Encounter (Signed)
 Error

## 2023-05-15 ENCOUNTER — Ambulatory Visit (HOSPITAL_COMMUNITY)
Admission: RE | Admit: 2023-05-15 | Discharge: 2023-05-15 | Disposition: A | Discharge status: Home / Self Care | Payer: Managed Care, Other (non HMO) | Source: Ambulatory Visit | Attending: Cardiology | Admitting: Cardiology

## 2023-05-15 DIAGNOSIS — I5022 Chronic systolic (congestive) heart failure: Secondary | ICD-10-CM | POA: Diagnosis present

## 2023-05-15 LAB — BASIC METABOLIC PANEL
Anion gap: 8 (ref 5–15)
BUN: 44 mg/dL — ABNORMAL HIGH (ref 6–20)
CO2: 30 mmol/L (ref 22–32)
Calcium: 9.1 mg/dL (ref 8.9–10.3)
Chloride: 104 mmol/L (ref 98–111)
Creatinine, Ser: 3.45 mg/dL — ABNORMAL HIGH (ref 0.61–1.24)
GFR, Estimated: 19 mL/min — ABNORMAL LOW (ref >60)
Glucose, Bld: 133 mg/dL — ABNORMAL HIGH (ref 70–99)
Potassium: 4.1 mmol/L (ref 3.5–5.1)
Sodium: 142 mmol/L (ref 135–145)

## 2023-05-24 NOTE — Progress Notes (Signed)
 [facility] Note   PCP: Karie Schwalbe, MD Primary Cardiologist: Dr. Katrinka Blazing  EP: Dr Allred Nephrology: Dr. Thedore Mins HF Cardiologist: Dr Gala Romney   CC: HF follow up  HPI: Phillip [name] is a 63 y.o. male hx of PAF, remote PE, hx of DVT, OSA, morbid obesity, CKD Stage III,  HTN, and  combined chronic systolic/diastolic. S/P A Flutter ablation May 2019 .     EF in 2015 40-45% Echo 12/21/16 with EF 55-60% G2DD, mild aortic regurgitation.  Last nuc 2008 no ischemia, no hx of cath due to CKD.    Developed AFL and underwent AFL ablation in 5/19.  7/20 Recurrent AFL -> EF back down to 30%  Underwent DC-CV on 10/14/18   Echo 11/20 EF 45-50%  Developed recurrent AF at the end of January 2021. Underwent attempt at [facility] on 04/22/19 but failed to convert. Seen back in AF Clinic on 2/5. Not able to use Tikosyn due to long QT. Not ablation candidate due to obesity. Considered amiodarone. Started on flecainide but had treadmill to monitor Flecainide Tx.   Patient had significant QRS widening within first minute of exercise.  Flecainide stopped  Echo 5/21 EF 45-50%  Recurrent AF in 9/22.-> DC-CV Amiodarone started.   Echo 9/22 EF 45-50% Marked LAE   Admitted 1/23 with a/c CHF and afib RVR. Echo EF 40-45%, global LV HK, RV ok. Diuresed and underwent DCCV.   Had AF ablation by Dr. Magdalene River at [facility] 07/23. Saw Afib clinic 08/23 and in rate controlled AF. Worse 7 day monitor - rhythm was sinus with 14% AF burden.  Admitted 1/25 with a/c HF. Echo 1/25 EF 35-40%, mild LVH, G2DD, RV normal. GDMT titrated and he was discharged home, weight 310 lbs.  Recurrent AF 2/25 -> DC-CV 05/11/23.   Here for post DC-CV follow-uo. Doing well but having a lot of PVCs throughout the day. Denies CP or SOB. Wearing CPAP regularly. Now back to amio 200 daily     Review of systems complete and found to be negative unless listed in HPI.    Past Medical History:  Diagnosis Date   Asthma    CHF  (congestive heart failure) (HCC) 11/15   EF 40-45%, improved with follow-up   Chronic kidney disease    COPD (chronic obstructive pulmonary disease) (HCC)    Deep vein thrombosis (DVT) of lower extremity (HCC) 08/17/2015   DVT (deep venous thrombosis) (HCC)    Gout    Hypertension    Hypertensive cardiovascular disease    Kidney dysfunction    Morbid obesity (HCC)    OSA on CPAP    Paroxysmal atrial fibrillation (HCC)    PNA (pneumonia)    Pulmonary embolism (HCC) 2010   left leg DVT   Sleep apnea    uses cpap   Current Outpatient Medications  Medication Sig Dispense Refill   albuterol (PROAIR HFA) 108 (90 Base) MCG/ACT inhaler Inhale 2 puffs into the lungs every 4 (four) hours as needed for wheezing or shortness of breath. 1 Inhaler 3   allopurinol (ZYLOPRIM) 300 MG tablet Take 1 tablet (300 mg total) by mouth daily. 90 tablet 1   amiodarone (PACERONE) 200 MG tablet Take 1 tablet (200 mg total) by mouth daily. 90 tablet 3   apixaban (ELIQUIS) 5 MG TABS tablet Take 1 tablet (5 mg total) by mouth 2 (two) times daily. 180 tablet 2   benzonatate (TESSALON) 200 MG capsule Take 1 capsule (200 mg total) by mouth 2 (  two) times daily as needed for cough. 20 capsule 0   budesonide-formoterol (SYMBICORT) 80-4.5 MCG/ACT inhaler Take 2 puffs first thing in am and then another 2 puffs about 12 hours later. 30.6 g 3   Calcium Carb-Cholecalciferol (CALCIUM 600 + D PO) Take 1 tablet by mouth every 3 (three) days.     carvedilol (COREG) 6.25 MG tablet Take 2 tablets (12.5 mg total) by mouth 2 (two) times daily with a meal. 120 tablet 5   Cholecalciferol (VITAMIN D3) 50 MCG (2000 UT) TABS Take 2,000 Units by mouth daily.     fluticasone (FLONASE) 50 MCG/ACT nasal spray Place 2 sprays into both nostrils as needed for allergies or rhinitis.     hydrALAZINE (APRESOLINE) 100 MG tablet Take 1 tablet (100 mg total) by mouth every 8 (eight) hours. 90 tablet 0   ibuprofen (ADVIL) 200 MG tablet Take 200 mg by  mouth every 6 (six) hours as needed for moderate pain (pain score 4-6).     isosorbide mononitrate (IMDUR) 60 MG 24 hr tablet Take 1 tablet (60 mg total) by mouth daily. 90 tablet 3   potassium chloride SA (KLOR-CON M) 20 MEQ tablet Take 40 mEq by mouth 2 (two) times daily.     tamsulosin (FLOMAX) 0.4 MG CAPS capsule Take 1 capsule (0.4 mg total) by mouth daily. 90 capsule 3   torsemide (DEMADEX) 20 MG tablet Take 3 tablets (60 mg total) by mouth 2 (two) times daily. 540 tablet 3   metolazone (ZAROXOLYN) 5 MG tablet Take 5 mg by mouth as directed. Only take when directed by doctor, will take 40 meq of potassium with each dose (Patient not taking: Reported on 05/26/2023)     No current facility-administered medications for this encounter.   Allergies  Allergen Reactions   Other Anaphylaxis    mushrooms    Social History   Socioeconomic History   Marital status: Divorced    Spouse name: Not on file   Number of children: 2   Years of education: 12   Highest education level: Not on file  Occupational History   Occupation: Drives dump truck    Comment:     Occupation: IT sales professional    Comment: Disabled  Tobacco Use   Smoking status: Former    Current packs/day: 0.00    Average packs/day: 0.3 packs/day for 1 year (0.3 ttl pk-yrs)    Types: Cigarettes    Start date: 07/23/1988    Quit date: 07/23/1989    Years since quitting: 33.8   Smokeless tobacco: Never   Tobacco comments:    Former smoker 03/06/2021  Vaping Use   Vaping status: Never Used  Substance and Sexual Activity   Alcohol use: No    Alcohol/week: 0.0 standard drinks of alcohol   Drug use: No   Sexual activity: Not Currently  Other Topics Concern   Not on file  Social History Narrative   Pt lives in [place], alone.   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   Has 2 sons in [place]      No living will   Would want sons to make decisions for him   Would accept resuscitation   Social  Drivers of Health   Financial Resource Strain: Low Risk  (01/19/2018)   Overall Financial Resource Strain (CARDIA)    Difficulty of Paying Living Expenses: Not hard at all  Food Insecurity: No Food Insecurity (04/13/2023)   Hunger Vital Sign  Worried About Programme researcher, broadcasting/film/video in the Last Year: Never true    Ran Out of Food in the Last Year: Never true  Transportation Needs: No Transportation Needs (04/13/2023)   PRAPARE - Administrator, Civil Service (Medical): No    Lack of Transportation (Non-Medical): No  Physical Activity: Not on file  Stress: Not on file  Social Connections: Not on file  Intimate Partner Violence: Not At Risk (04/13/2023)   Humiliation, Afraid, Rape, and Kick questionnaire    Fear of Current or Ex-Partner: No    Emotionally Abused: No    Physically Abused: No    Sexually Abused: No    Family History  Problem Relation Age of Onset   Hypertension Mother    Diabetes Father    CAD Father    Clotting disorder Father    Heart disease Father        transplant   Stomach cancer Father    Allergies Brother    Colon cancer Neg Hx    Colon polyps Neg Hx    Esophageal cancer Neg Hx    Rectal cancer Neg Hx    BP 130/70   Pulse (!) 50   Wt 135.5 kg (298 lb 12.8 oz)   SpO2 97%   BMI 44.13 kg/m   Wt Readings from Last 3 Encounters:  05/26/23 135.5 kg (298 lb 12.8 oz)  04/24/23 (!) 139.3 kg (307 lb 3.2 oz)  04/16/23 (!) 137.8 kg (303 lb 12.8 oz)   PHYSICAL EXAM: General:  Well appearing. No resp difficulty HEENT: normal Neck: supple. no JVD. Carotids 2+ bilat; no bruits. No lymphadenopathy or thryomegaly appreciated. Cor: PMI nondisplaced. Regular rate & rhythm. No rubs, gallops or murmurs. Lungs: clear Abdomen:obese  soft, nontender, nondistended. No hepatosplenomegaly. No bruits or masses. Good bowel sounds. Extremities: no cyanosis, clubbing, rash, edema Neuro: alert & orientedx3, cranial nerves grossly intact. moves all 4 extremities w/o  difficulty. Affect pleasant  ECG (personally reviewed): Sinus brady 54 1 PVC Personally reviewed  ASSESSMENT & PLAN:  1. Chronic HFmEF - Echo 11/2016 LVEF 55-60%, Grade 2 DD. - EF 7/20 back down to 30%. Suspect due to AF but rate not overly elevated. HTN may also be playing a role. - Symptoms improved after DC-CV on 10/11/18  - Echo 11/20 EF 45-50% - Echo 12/23 EF 40-45%, global LV HK, RV ok. - Echo 1/25: EF 35-40%, RV ok - NYHA I-II - Volume ok.  - Continue torsemide 60 mg bid + 40 KCL bid. Use metolazone as needed - Continue Coreg 12.5 mg bid. - Continue hydralazine 100 mg tid and Imdur 60 daily - Off Farxiga due to AKI and refuses to restart due to cost. - Has not had cath due to CKD and lack of clear anginal symptoms.  - No ACE/ARB with CKD (creatinine has run 3.0-3.5) - Echo in 4 weeks   2. PAF / Typical A-flutter, paroxysmal - S/P AFL ablation 07/2017. - s/p DC-CV for AFib on 10/14/18 and 05/10/19 and 9/22 - Amio started 9/22 - S/p AF ablation 7/23 by Dr. Deno Lunger - He is compliant with CPAP. - Recurrent AF 2/25 -> DC-CV 05/11/23. Amio increased to 200 bid x 2 weeks  - Now in NSR but having frequent PVCs. -> check zio  - Continue Eliquis 5 mg bid. No bleeding - Saw EP, not a re-do ablation candidate until weight closer to 250 lbs.    3. Hypertension - Blood pressure well controlled. Continue current regimen.  4. Morbid obesity - Body mass index is 44.13 kg/m.  - He has lost over 100 pounds. Congratulated him.  - Has not been interested in GLP1RA.  5. CKD IV - Baseline SCr ~3.0-3.5 - Follows with Dr. Thedore Mins.  - Labs today  6. OSA on CPAP - Compliant with CPAP.  - No change  7. Iron deficiency anemia - Getting Venofer  - Denies recent bleeding issues - Followed by Heme/Onc  8. Frequent PVCs - check zio   Arvilla Meres, MD  9:19 AM

## 2023-05-26 ENCOUNTER — Inpatient Hospital Stay (HOSPITAL_COMMUNITY)
Admission: RE | Admit: 2023-05-26 | Discharge: 2023-05-26 | Disposition: A | Discharge status: Home / Self Care | Source: Ambulatory Visit | Attending: Internal Medicine | Admitting: Internal Medicine

## 2023-05-26 ENCOUNTER — Encounter (HOSPITAL_COMMUNITY): Payer: Self-pay | Admitting: Internal Medicine

## 2023-05-26 ENCOUNTER — Ambulatory Visit (HOSPITAL_COMMUNITY)
Admission: RE | Admit: 2023-05-26 | Discharge: 2023-05-26 | Disposition: A | Discharge status: Home / Self Care | Payer: Managed Care, Other (non HMO) | Source: Ambulatory Visit | Attending: Internal Medicine | Admitting: Internal Medicine

## 2023-05-26 ENCOUNTER — Other Ambulatory Visit (HOSPITAL_COMMUNITY): Payer: Self-pay | Admitting: Internal Medicine

## 2023-05-26 VITALS — BP 130/70 | HR 50 | Wt 298.8 lb

## 2023-05-26 DIAGNOSIS — I493 Ventricular premature depolarization: Secondary | ICD-10-CM | POA: Diagnosis not present

## 2023-05-26 DIAGNOSIS — Z6841 Body Mass Index (BMI) 40.0 and over, adult: Secondary | ICD-10-CM | POA: Diagnosis not present

## 2023-05-26 DIAGNOSIS — I5022 Chronic systolic (congestive) heart failure: Secondary | ICD-10-CM | POA: Diagnosis not present

## 2023-05-26 DIAGNOSIS — Z86711 Personal history of pulmonary embolism: Secondary | ICD-10-CM | POA: Insufficient documentation

## 2023-05-26 DIAGNOSIS — N184 Chronic kidney disease, stage 4 (severe): Secondary | ICD-10-CM | POA: Insufficient documentation

## 2023-05-26 DIAGNOSIS — I130 Hypertensive heart and chronic kidney disease with heart failure and stage 1 through stage 4 chronic kidney disease, or unspecified chronic kidney disease: Secondary | ICD-10-CM | POA: Insufficient documentation

## 2023-05-26 DIAGNOSIS — I480 Paroxysmal atrial fibrillation: Secondary | ICD-10-CM

## 2023-05-26 DIAGNOSIS — G4733 Obstructive sleep apnea (adult) (pediatric): Secondary | ICD-10-CM | POA: Diagnosis not present

## 2023-05-26 DIAGNOSIS — N183 Chronic kidney disease, stage 3 unspecified: Secondary | ICD-10-CM | POA: Diagnosis present

## 2023-05-26 DIAGNOSIS — I483 Typical atrial flutter: Secondary | ICD-10-CM | POA: Diagnosis not present

## 2023-05-26 DIAGNOSIS — I5042 Chronic combined systolic (congestive) and diastolic (congestive) heart failure: Secondary | ICD-10-CM | POA: Diagnosis present

## 2023-05-26 DIAGNOSIS — D509 Iron deficiency anemia, unspecified: Secondary | ICD-10-CM | POA: Diagnosis not present

## 2023-05-26 DIAGNOSIS — Z86718 Personal history of other venous thrombosis and embolism: Secondary | ICD-10-CM | POA: Insufficient documentation

## 2023-05-26 LAB — BASIC METABOLIC PANEL
Anion gap: 13 (ref 5–15)
BUN: 63 mg/dL — ABNORMAL HIGH (ref 6–20)
CO2: 32 mmol/L (ref 22–32)
Calcium: 9.6 mg/dL (ref 8.9–10.3)
Chloride: 98 mmol/L (ref 98–111)
Creatinine, Ser: 4.36 mg/dL — ABNORMAL HIGH (ref 0.61–1.24)
GFR, Estimated: 15 mL/min — ABNORMAL LOW (ref >60)
Glucose, Bld: 96 mg/dL (ref 70–99)
Potassium: 3.8 mmol/L (ref 3.5–5.1)
Sodium: 143 mmol/L (ref 135–145)

## 2023-05-26 LAB — BRAIN NATRIURETIC PEPTIDE: B Natriuretic Peptide: 743.7 pg/mL — ABNORMAL HIGH (ref 0.0–100.0)

## 2023-05-26 NOTE — Patient Instructions (Signed)
 Great to see you today!!!  Medication Changes:  None, continue current medications  Lab Work:  Labs done today, your results will be available in MyChart, we will contact you for abnormal readings.  Testing/Procedures:  Your provider has recommended that  you wear a Zio Patch for 7 days.  This monitor will record your heart rhythm for our review.  IF you have any symptoms while wearing the monitor please press the button.  If you have any issues with the patch or you notice a red or orange light on it please call the company at [phone].  Once you remove the patch please mail it back to the company as soon as possible so we can get the results.  Your physician has requested that you have an echocardiogram. Echocardiography is a painless test that uses sound waves to create images of your heart. It provides your doctor with information about the size and shape of your heart and how well your heart's chambers and valves are working. This procedure takes approximately one hour. There are no restrictions for this procedure. Please do NOT wear cologne, perfume, aftershave, or lotions (deodorant is allowed). Please arrive 15 minutes prior to your appointment time. IN 1 MONTH  Please note: We ask at that you not bring children with you during ultrasound (echo/ vascular) testing. Due to room size and safety concerns, children are not allowed in the ultrasound rooms during exams. Our front office staff cannot provide observation of children in our lobby area while testing is being conducted. An adult accompanying a patient to their appointment will only be allowed in the ultrasound room at the discretion of the ultrasound technician under special circumstances. We apologize for any inconvenience.  Special Instructions // Education:  Do the following things EVERYDAY: Weigh yourself in the morning before breakfast. Write it down and keep it in a log. Take your medicines as prescribed Eat low  salt foods--Limit salt (sodium) to 2000 mg per day.  Stay as active as you can everyday Limit all fluids for the day to less than 2 liters   Follow-Up in: 2 months   At the [facility], you and your health needs are our priority. We have a designated team specialized in the treatment of Heart Failure. This Care Team includes your primary Heart Failure Specialized Cardiologist (physician), Advanced Practice Providers (APPs- Physician Assistants and Nurse Practitioners), and Pharmacist who all work together to provide you with the care you need, when you need it.   You may see any of the following providers on your designated Care Team at your next follow up:  Dr. Arvilla Meres Dr. Marca Ancona Dr. Dorthula Nettles Dr. Theresia Bough Tonye Becket, NP Robbie Lis, [place] [facility] [place], [place] Brynda Peon, NP [place] Lee, NP Karle Plumber, PharmD   Please be sure to bring in all your medications bottles to every appointment.   Need to Contact us:  If you have any questions or concerns before your next appointment please send [place] a message through [place] or call our office at [phone].    TO LEAVE A MESSAGE FOR THE NURSE SELECT OPTION 2, PLEASE LEAVE A MESSAGE INCLUDING: YOUR NAME DATE OF BIRTH CALL BACK NUMBER REASON FOR CALL**this is important as we prioritize the call backs  YOU WILL RECEIVE A CALL BACK THE SAME DAY AS LONG AS YOU CALL BEFORE 4:00 PM

## 2023-05-26 NOTE — Addendum Note (Signed)
 Encounter addended by: Noralee Space, RN on: 05/26/2023 9:37 AM  Actions taken: Order list changed, Diagnosis association updated, Clinical Note Signed, Charge Capture section accepted

## 2023-05-31 ENCOUNTER — Emergency Department (HOSPITAL_BASED_OUTPATIENT_CLINIC_OR_DEPARTMENT_OTHER): Admitting: Radiology

## 2023-05-31 ENCOUNTER — Emergency Department (HOSPITAL_BASED_OUTPATIENT_CLINIC_OR_DEPARTMENT_OTHER)

## 2023-05-31 ENCOUNTER — Emergency Department (HOSPITAL_BASED_OUTPATIENT_CLINIC_OR_DEPARTMENT_OTHER)
Admission: EM | Admit: 2023-05-31 | Discharge: 2023-05-31 | Disposition: A | Discharge status: Home / Self Care | Attending: Emergency Medicine | Admitting: Emergency Medicine

## 2023-05-31 ENCOUNTER — Other Ambulatory Visit: Payer: Self-pay

## 2023-05-31 DIAGNOSIS — S82101A Unspecified fracture of upper end of right tibia, initial encounter for closed fracture: Secondary | ICD-10-CM | POA: Insufficient documentation

## 2023-05-31 DIAGNOSIS — X501XXA Overexertion from prolonged static or awkward postures, initial encounter: Secondary | ICD-10-CM | POA: Diagnosis not present

## 2023-05-31 DIAGNOSIS — Z7901 Long term (current) use of anticoagulants: Secondary | ICD-10-CM | POA: Insufficient documentation

## 2023-05-31 DIAGNOSIS — N189 Chronic kidney disease, unspecified: Secondary | ICD-10-CM | POA: Diagnosis not present

## 2023-05-31 DIAGNOSIS — I5030 Unspecified diastolic (congestive) heart failure: Secondary | ICD-10-CM | POA: Insufficient documentation

## 2023-05-31 DIAGNOSIS — S82141A Displaced bicondylar fracture of right tibia, initial encounter for closed fracture: Secondary | ICD-10-CM

## 2023-05-31 DIAGNOSIS — S93491A Sprain of other ligament of right ankle, initial encounter: Secondary | ICD-10-CM | POA: Insufficient documentation

## 2023-05-31 DIAGNOSIS — M25561 Pain in right knee: Secondary | ICD-10-CM | POA: Diagnosis present

## 2023-05-31 MED ORDER — OXYCODONE-ACETAMINOPHEN 5-325 MG PO TABS: 1.0000 | ORAL_TABLET | Freq: Four times a day (QID) | ORAL | 0 refills | Status: DC | PRN

## 2023-05-31 MED ORDER — ACETAMINOPHEN 325 MG PO TABS
650.0000 mg | ORAL_TABLET | Freq: Once | ORAL | Status: AC
  Administered 2023-05-31: 650 mg via ORAL
  Filled 2023-05-31: qty 2

## 2023-05-31 MED ORDER — OXYCODONE-ACETAMINOPHEN 5-325 MG PO TABS
1.0000 | ORAL_TABLET | Freq: Once | ORAL | Status: AC
  Administered 2023-05-31: 1 via ORAL
  Filled 2023-05-31: qty 1

## 2023-05-31 NOTE — ED Triage Notes (Signed)
 Slipped picking up trash 2hr PTA. Landed with right knee under body and ankle twisted. Was able to walk after, but having difficulty now.

## 2023-05-31 NOTE — ED Provider Notes (Signed)
 [place] EMERGENCY DEPARTMENT AT [facility] Provider Note   CSN: [id number] Arrival date & time: 05/31/23  1456     History  No chief complaint on file.   Phillip [name] is a 63 y.o. male with past medical history significant for obstructive sleep apnea, CKD, paroxysmal A-fib, diastolic heart failure, obesity who presents with concern for acute right knee and ankle pain.  Patient reports that right knee under body and ankle twisted.  He was able to walk briefly after but having difficulty now.  Rates pain 8/10.  HPI     Home Medications Prior to Admission medications   Medication Sig Start Date End Date Taking? Authorizing Provider  oxyCODONE-acetaminophen (PERCOCET/ROXICET) 5-325 MG tablet Take 1 tablet by mouth every 6 (six) hours as needed for severe pain (pain score 7-10). 05/31/23  Yes Willene Holian H, PA-C  albuterol (PROAIR HFA) 108 (90 Base) MCG/ACT inhaler Inhale 2 puffs into the lungs every 4 (four) hours as needed for wheezing or shortness of breath. 05/14/18   Nyoka Cowden, MD  allopurinol (ZYLOPRIM) 300 MG tablet Take 1 tablet (300 mg total) by mouth daily. 02/06/23   Karie Schwalbe, MD  amiodarone (PACERONE) 200 MG tablet Take 1 tablet (200 mg total) by mouth daily. 05/09/23   Milford, Anderson Malta, FNP  apixaban (ELIQUIS) 5 MG TABS tablet Take 1 tablet (5 mg total) by mouth 2 (two) times daily. 03/26/23   Fenton, Clint R, PA  benzonatate (TESSALON) 200 MG capsule Take 1 capsule (200 mg total) by mouth 2 (two) times daily as needed for cough. 04/25/23   Delorse Lek, FNP  budesonide-formoterol (SYMBICORT) 80-4.5 MCG/ACT inhaler Take 2 puffs first thing in am and then another 2 puffs about 12 hours later. 04/16/23   Nyoka Cowden, MD  Calcium Carb-Cholecalciferol (CALCIUM 600 + D PO) Take 1 tablet by mouth every 3 (three) days.    [provider]  carvedilol (COREG) 6.25 MG tablet Take 2 tablets (12.5 mg total) by mouth 2 (two) times daily with a meal.  05/06/23 11/02/23  Milford, Anderson Malta, FNP  Cholecalciferol (VITAMIN D3) 50 MCG (2000 UT) TABS Take 2,000 Units by mouth daily.    [provider]  fluticasone (FLONASE) 50 MCG/ACT nasal spray Place 2 sprays into both nostrils as needed for allergies or rhinitis.    [provider]  hydrALAZINE (APRESOLINE) 100 MG tablet Take 1 tablet (100 mg total) by mouth every 8 (eight) hours. 04/18/21   Burnadette Pop, MD  ibuprofen (ADVIL) 200 MG tablet Take 200 mg by mouth every 6 (six) hours as needed for moderate pain (pain score 4-6).    [provider]  isosorbide mononitrate (IMDUR) 60 MG 24 hr tablet Take 1 tablet (60 mg total) by mouth daily. 04/24/23 07/23/23  Jacklynn Ganong, FNP  metolazone (ZAROXOLYN) 5 MG tablet Take 5 mg by mouth as directed. Only take when directed by doctor, will take 40 meq of potassium with each dose Patient not taking: Reported on 05/26/2023    [provider]  potassium chloride SA (KLOR-CON M) 20 MEQ tablet Take 40 mEq by mouth 2 (two) times daily.    [provider]  tamsulosin (FLOMAX) 0.4 MG CAPS capsule Take 1 capsule (0.4 mg total) by mouth daily. 02/27/23   Karie Schwalbe, MD  torsemide (DEMADEX) 20 MG tablet Take 3 tablets (60 mg total) by mouth 2 (two) times daily. 04/15/23   Regalado, Prentiss Bells, MD  Allergies    Other    Review of Systems   Review of Systems  All other systems reviewed and are negative.   Physical Exam Updated Vital Signs BP (!) 157/74 (BP Location: Right Arm)   Pulse 60   Temp 98 F (36.7 C) (Temporal)   Resp 18   SpO2 100%  Physical Exam Vitals and nursing note reviewed.  Constitutional:      General: He is not in acute distress.    Appearance: Normal appearance.  HENT:     Head: Normocephalic and atraumatic.  Eyes:     General:        Right eye: No discharge.        Left eye: No discharge.  Cardiovascular:     Rate and Rhythm: Normal rate and regular rhythm.     Pulses:  Normal pulses.  Pulmonary:     Effort: Pulmonary effort is normal. No respiratory distress.  Musculoskeletal:        General: No deformity.     Comments: Patient with significant tenderness to palpation at the 10 o'clock position of the right knee on the lateral aspect, some mild soft tissue swelling.  He has intact range of motion passively but some pain with active flexion, extension of the right knee.  No step-off or deformity noted of knee, right tip/fib, or right ankle, some tenderness to palpation along ATFL distribution of right ankle.  Intact strength to plantarflexion, dorsiflexion.  Skin:    General: Skin is warm and dry.  Neurological:     Mental Status: He is alert and oriented to person, place, and time.  Psychiatric:        Mood and Affect: Mood normal.        Behavior: Behavior normal.     ED Results / Procedures / Treatments   Labs (all labs ordered are listed, but only abnormal results are displayed) Labs Reviewed - No data to display  EKG None  Radiology CT Knee Right Wo Contrast Result Date: 05/31/2023 CLINICAL DATA:  Knee trauma, internal derangement suspected, xray done EXAM: CT OF THE RIGHT KNEE WITHOUT CONTRAST TECHNIQUE: Multidetector CT imaging of the right knee was performed according to the standard protocol. Multiplanar CT image reconstructions were also generated. RADIATION DOSE REDUCTION: This exam was performed according to the departmental dose-optimization program which includes automated exposure control, adjustment of the mA and/or kV according to patient size and/or use of iterative reconstruction technique. COMPARISON:  Radiograph earlier today FINDINGS: Bones/Joint/Cartilage Nondisplaced fracture of the medial tibial plateau posteriorly, for example series 3, image 97 and sagittal series 7 image 71. No significant articular depression. Fracture extends through extensive subchondral cystic change. There is severe tricompartmental osteoarthritis with joint  space narrowing, large spurs, and subchondral cystic change. Large joint effusion. Ligaments Suboptimally assessed by CT. Muscles and Tendons No gross disruption of quadriceps or patellar tendons. No intramuscular hematoma in this unenhanced exam. Soft tissues Suspected varicosities in the medial soft tissues. Soft tissue edema anteriorly. IMPRESSION: 1. Nondisplaced fracture of the medial tibial plateau posteriorly. No significant articular depression. 2. Severe tricompartmental osteoarthritis. 3. Large joint effusion. Electronically Signed   By: Narda Rutherford M.D.   On: 05/31/2023 17:24   DG Ankle Complete Right Result Date: 05/31/2023 CLINICAL DATA:  Fall EXAM: RIGHT ANKLE - COMPLETE 3+ VIEW COMPARISON:  None Available. FINDINGS: Osteopenia. No acute fracture or dislocation. Tibiotalar degenerative changes. Midfoot degenerative changes. No area of erosion or osseous destruction. No unexpected radiopaque foreign body. Calcific  density adjacent to the medial malleolus is favored to reflect a vascular calcification versus sequela of remote prior trauma. Vascular calcifications. IMPRESSION: No acute fracture or dislocation. Electronically Signed   By: Meda Klinefelter M.D.   On: 05/31/2023 16:16   DG Knee Complete 4 Views Right Result Date: 05/31/2023 CLINICAL DATA:  Fall EXAM: RIGHT KNEE - COMPLETE 4+ VIEW COMPARISON:  September 17, 2010. FINDINGS: Osteopenia. Query irregularity along the medial femoral condyle. There are severe degenerative changes of the medial compartment with joint space narrowing and multifocal subcortical lucencies and sclerosis. Moderate joint space narrowing of the lateral and patellofemoral compartment with osteophyte formation. Large joint effusion. Vascular calcifications. IMPRESSION: 1. Query irregularity along the medial femoral condyle. Given presence of large joint effusion, this could reflect a fracture versus summation artifact from severe degenerative changes/subcortical cystic  change. Recommend further evaluation with dedicated CT. 2. Tricompartmental degenerative changes, severe in the medial compartment Electronically Signed   By: Meda Klinefelter M.D.   On: 05/31/2023 16:15    Procedures Procedures    Medications Ordered in ED Medications  acetaminophen (TYLENOL) tablet 650 mg (650 mg Oral Given 05/31/23 1551)  oxyCODONE-acetaminophen (PERCOCET/ROXICET) 5-325 MG per tablet 1 tablet (1 tablet Oral Given 05/31/23 1709)    ED Course/ Medical Decision Making/ A&P                                 Medical Decision Making Amount and/or Complexity of Data Reviewed Radiology: ordered.  Risk OTC drugs. Prescription drug management.    This patient is a 63 y.o. male who presents to the ED for concern of knee pain, ankle pain.   Differential diagnoses prior to evaluation: Acute fracture, dislocation, versus other  Past Medical History / Social History / Additional history: Chart reviewed. Pertinent results include: obstructive sleep apnea, CKD, paroxysmal A-fib, diastolic heart failure, obesity  Physical Exam: Physical exam performed. The pertinent findings include: Patient with significant tenderness to palpation at the 10 o'clock position of the right knee on the lateral aspect, some mild soft tissue swelling.  He has intact range of motion passively but some pain with active flexion, extension of the right knee.  No step-off or deformity noted of knee, right tip/fib, or right ankle, some tenderness to palpation along ATFL distribution of right ankle.  Intact strength to plantarflexion, dorsiflexion.   DP, PT pulses 2+ in the affected Right lower extremity  Medications / Treatment: Tylenol, Percocet for pain   Consults: Spoke with Dr. Charlann Boxer with orthopedics who recommends follow-up in 2 weeks, nonweightbearing, knee immobilizer  Disposition: After consideration of the diagnostic results and the patients response to treatment, I feel that patient stable  for discharge with medial tibial plateau fracture on right, severe OA, and ATFL sprain on right, nonweightbearing, discussed RICE when not wearing knee braces or ankle brace.   emergency department workup does not suggest an emergent condition requiring admission or immediate intervention beyond what has been performed at this time. The plan is: as above. The patient is safe for discharge and has been instructed to return immediately for worsening symptoms, change in symptoms or any other concerns.  Final Clinical Impression(s) / ED Diagnoses Final diagnoses:  Closed fracture of right tibial plateau, initial encounter  Sprain of anterior talofibular ligament of right ankle, initial encounter    Rx / DC Orders ED Discharge Orders          Ordered  oxyCODONE-acetaminophen (PERCOCET/ROXICET) 5-325 MG tablet  Every 6 hours PRN        05/31/23 1753              Brennah Quraishi, Harrel Carina, PA-C 05/31/23 1756    Franne Forts, DO 06/01/23 0006

## 2023-05-31 NOTE — Discharge Instructions (Addendum)
 Please use Tylenol or ibuprofen for pain.  You may use 600 mg ibuprofen every 6 hours or 1000 mg of Tylenol every 6 hours.  You may choose to alternate between the 2.  This would be most effective.  Not to exceed 4 g of Tylenol within 24 hours.  Not to exceed 3200 mg ibuprofen 24 hours.  You can use the stronger narcotic pain medication in place of Tylenol for severe break through pain.  If you take the narcotic pain medication that we prescribed recommend that you also take a laxative such as MiraLAX or Dulcolax every day that you take the narcotic pain medicine, and drink plenty of fluids, 50 to 64 ounces to prevent any constipation.  This is a nonweightbearing injury, please do not put any weight on the right knee unless otherwise instructed by the orthopedic physician, please follow-up closely for further evaluation and management.

## 2023-06-17 ENCOUNTER — Other Ambulatory Visit: Payer: Self-pay | Admitting: Internal Medicine

## 2023-06-17 ENCOUNTER — Other Ambulatory Visit (HOSPITAL_COMMUNITY): Payer: Self-pay | Admitting: Physician Assistant

## 2023-06-25 DIAGNOSIS — M25571 Pain in right ankle and joints of right foot: Secondary | ICD-10-CM | POA: Insufficient documentation

## 2023-06-26 ENCOUNTER — Encounter: Payer: Self-pay | Admitting: *Deleted

## 2023-06-26 ENCOUNTER — Ambulatory Visit (HOSPITAL_COMMUNITY)
Admission: RE | Admit: 2023-06-26 | Discharge: 2023-06-26 | Disposition: A | Discharge status: Home / Self Care | Source: Ambulatory Visit | Attending: Internal Medicine | Admitting: Internal Medicine

## 2023-06-26 DIAGNOSIS — I080 Rheumatic disorders of both mitral and aortic valves: Secondary | ICD-10-CM | POA: Insufficient documentation

## 2023-06-26 DIAGNOSIS — I5022 Chronic systolic (congestive) heart failure: Secondary | ICD-10-CM | POA: Insufficient documentation

## 2023-06-26 DIAGNOSIS — Z006 Encounter for examination for normal comparison and control in clinical research program: Secondary | ICD-10-CM

## 2023-06-26 LAB — ECHOCARDIOGRAM COMPLETE
AR max vel: 3.93 cm2
AV Area VTI: 3.95 cm2
AV Area mean vel: 4.3 cm2
AV Mean grad: 8 mmHg
AV Peak grad: 16.3 mmHg
Ao pk vel: 2.02 m/s
Area-P 1/2: 2.87 cm2
Calc EF: 46.8 %
P 1/2 time: 307 ms
S' Lateral: 6.3 cm
Single Plane A2C EF: 50.7 %
Single Plane A4C EF: 41.8 %

## 2023-06-26 MED ORDER — PERFLUTREN LIPID MICROSPHERE
1.0000 mL | INTRAVENOUS | Status: AC | PRN
  Administered 2023-06-26: 2 mL via INTRAVENOUS

## 2023-06-26 NOTE — Research (Signed)
 SITE: [id number]     Subject #[id number]  Subprotocol: A  Inclusion Criteria  Patients who meet all of the following criteria are eligible for enrollment as study participants:  Yes No  Age > 61 years old X   Eligible to wear Holter Study X    Exclusion Criteria  Patients who meet any of these criteria are not eligible for enrollment as study participants: Yes No  1. Receiving any mechanical (respiratory or circulatory) or renal support therapy at Screening or during Visit #1.  X  2.  Any other conditions that in the opinion of the investigators are likely to prevent compliance with the study protocol or pose a safety concern if the subject participates in the study.  X  3. Poor tolerance, namely susceptible to severe skin allergies from ECG adhesive patch application.  X   Protocol: REV H                                     Residential Zip code*  [address] (First 3 digits ONLY)                                             [facility] Informed Consent   Subject Name: Phillip [name]  Subject met inclusion and exclusion criteria.  The informed consent form, study requirements and expectations were reviewed with the subject. Subject had opportunity to read consent and questions and concerns were addressed prior to the signing of the consent form.  The subject verbalized understanding of the trial requirements.  The subject agreed to participate in the EF-ACT trial and signed the informed consent at 0955 on 06/26/2023.  The informed consent was obtained prior to performance of any protocol-specific procedures for the subject.  A copy of the signed informed consent was given to the subject and a copy was placed in the subject's medical record.   Brunilda Payor          Current Outpatient Medications:    albuterol (PROAIR HFA) 108 (90 Base) MCG/ACT inhaler, Inhale 2 puffs into the lungs every 4 (four) hours as needed for wheezing or shortness of breath., Disp: 1 Inhaler, Rfl: 3   allopurinol (ZYLOPRIM) 300 MG  tablet, TAKE 1 TABLET BY MOUTH EVERY DAY, Disp: 90 tablet, Rfl: 3   amiodarone (PACERONE) 200 MG tablet, Take 1 tablet (200 mg total) by mouth daily., Disp: 90 tablet, Rfl: 3   apixaban (ELIQUIS) 5 MG TABS tablet, Take 1 tablet (5 mg total) by mouth 2 (two) times daily., Disp: 180 tablet, Rfl: 2   benzonatate (TESSALON) 200 MG capsule, Take 1 capsule (200 mg total) by mouth 2 (two) times daily as needed for cough., Disp: 20 capsule, Rfl: 0   budesonide-formoterol (SYMBICORT) 80-4.5 MCG/ACT inhaler, Take 2 puffs first thing in am and then another 2 puffs about 12 hours later., Disp: 30.6 g, Rfl: 3   Calcium Carb-Cholecalciferol (CALCIUM 600 + D PO), Take 1 tablet by mouth every 3 (three) days., Disp: , Rfl:    carvedilol (COREG) 6.25 MG tablet, Take 2 tablets (12.5 mg total) by mouth 2 (two) times daily with a meal., Disp: 120 tablet, Rfl: 5   Cholecalciferol (VITAMIN D3) 50 MCG (2000 UT) TABS, Take 2,000 Units by mouth daily., Disp: , Rfl:    fluticasone (FLONASE)  50 MCG/ACT nasal spray, Place 2 sprays into both nostrils as needed for allergies or rhinitis., Disp: , Rfl:    hydrALAZINE (APRESOLINE) 100 MG tablet, Take 1 tablet (100 mg total) by mouth every 8 (eight) hours., Disp: 90 tablet, Rfl: 0   ibuprofen (ADVIL) 200 MG tablet, Take 200 mg by mouth every 6 (six) hours as needed for moderate pain (pain score 4-6)., Disp: , Rfl:    isosorbide mononitrate (IMDUR) 60 MG 24 hr tablet, Take 1 tablet (60 mg total) by mouth daily., Disp: 90 tablet, Rfl: 3   metolazone (ZAROXOLYN) 5 MG tablet, Take 5 mg by mouth as directed. Only take when directed by doctor, will take 40 meq of potassium with each dose (Patient not taking: Reported on 05/26/2023), Disp: , Rfl:    oxyCODONE-acetaminophen (PERCOCET/ROXICET) 5-325 MG tablet, Take 1 tablet by mouth every 6 (six) hours as needed for severe pain (pain score 7-10)., Disp: 15 tablet, Rfl: 0   potassium chloride SA (KLOR-CON M) 20 MEQ tablet, Take 40 mEq by mouth 2  (two) times daily., Disp: , Rfl:    tamsulosin (FLOMAX) 0.4 MG CAPS capsule, Take 1 capsule (0.4 mg total) by mouth daily., Disp: 90 capsule, Rfl: 3   torsemide (DEMADEX) 20 MG tablet, Take 3 tablets (60 mg total) by mouth 2 (two) times daily., Disp: 540 tablet, Rfl: 3 No current facility-administered medications for this visit.  Facility-Administered Medications Ordered in Other Visits:    perflutren lipid microspheres (DEFINITY) IV suspension, 1-10 mL, Intravenous, PRN, Bensimhon, Bevelyn Buckles, MD, 2 mL at 06/26/23 [phone]

## 2023-07-24 ENCOUNTER — Telehealth (HOSPITAL_COMMUNITY): Payer: Self-pay

## 2023-07-24 NOTE — Progress Notes (Signed)
 Advanced [facility] Failure Clinic Note   PCP: Helaine Llanos, MD Primary Cardiologist: Dr. Felipe Horton  EP: Dr Allred Nephrology: Dr. Zelda Hickman HF Cardiologist: Dr Julane Ny   HPI: Phillip [name] is a 63 y.o. male hx of PAF, remote PE, hx of DVT, OSA, morbid obesity, CKD Stage III,  HTN, and  combined chronic systolic/diastolic.   Echo 2015: EF 40-45%  Echo 9/18 with EF 55-60% G2DD, mild aortic regurgitation.  Nuc 2008 no ischemia, no hx of cath due to CKD.    Developed AFL and underwent AFL ablation in 5/19.  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 Underwent attempt at [facility] on 04/22/19 but failed to convert. Seen back in AF Clinic, not able to use Tikosyn due to long QT. Not ablation candidate due to obesity. Considered amiodarone . Started on flecainide  but had treadmill to monitor Flecainide  Tx. Patient had significant QRS widening within first minute of exercise =>flecainide  stopped  Echo 5/21 EF 45-50%  Recurrent AF in 9/22.-> DC-CV, amiodarone  started.   Echo 9/22 EF 45-50% Marked LAE   Admitted 1/23 with a/c CHF and afib RVR. Echo EF 40-45%, global LV HK, RV ok. Diuresed and underwent DCCV.   Had AF ablation by Dr. Hoy Mackintosh at [facility] 7/23. Saw Afib clinic 8/23 and in rate controlled AF. Wore 7 day monitor - rhythm was sinus with 14% AF burden.  Admitted 1/25 with a/c HF. Echo 1/25 EF 35-40%, mild LVH, G2DD, RV normal, moderate AI. GDMT titrated and he was discharged home, weight 310 lbs.  Recurrent AF 2/25 -> DC-CV 05/11/23.   8 day Zio 3/25 (on amio 200) showed mostly NSR, 2.2% PVC burden. Echo 4/25 EF 40-45%, normal RV, moderate to severe aortic regurgitation AoV mean gradient 8 mmHg, AVA 3.95 cm   Today he returns for HF follow up. Overall feeling fine. Main complaint is R knee pain after falling. Saw Ortho and they are treating conservatively. He is SOB walking up steps but feels overall well.  Denies palpitations, abnormal bleeding, CP, dizziness, edema, or PND/Orthopnea. Appetite ok. Weight at home 295 pounds. Taking all medications. Has not used metolazone  in several months. Wears CPAP. Due for nephrology follow up soon. Has a new granddaughter, [name], born 3/17.  Review of systems complete and found to be negative unless listed in HPI.    Past Medical History:  Diagnosis Date   Asthma    CHF (congestive heart failure) (HCC) 11/15   EF 40-45%, improved with follow-up   Chronic kidney disease    COPD (chronic obstructive pulmonary disease) (HCC)    Deep vein thrombosis (DVT) of lower extremity (HCC) 08/17/2015   DVT (deep venous thrombosis) (HCC)    Gout    Hypertension    Hypertensive cardiovascular disease    Kidney dysfunction    Morbid obesity (HCC)    OSA on CPAP    Paroxysmal atrial fibrillation (HCC)    PNA (pneumonia)    Pulmonary embolism (HCC) 2010   left leg DVT   Sleep apnea    uses cpap   Current Outpatient Medications  Medication Sig Dispense Refill   albuterol  (PROAIR  HFA) 108 (90 Base) MCG/ACT inhaler Inhale 2 puffs into the lungs every 4 (four) hours as needed for wheezing or shortness of breath. 1 Inhaler 3   allopurinol  (ZYLOPRIM ) 300 MG tablet TAKE 1 TABLET BY MOUTH EVERY DAY 90 tablet 3   amiodarone  (PACERONE )  200 MG tablet Take 1 tablet (200 mg total) by mouth daily. 90 tablet 3   apixaban  (ELIQUIS ) 5 MG TABS tablet Take 1 tablet (5 mg total) by mouth 2 (two) times daily. 180 tablet 2   benzonatate  (TESSALON ) 200 MG capsule Take 1 capsule (200 mg total) by mouth 2 (two) times daily as needed for cough. 20 capsule 0   budesonide -formoterol  (SYMBICORT ) 80-4.5 MCG/ACT inhaler Take 2 puffs first thing in am and then another 2 puffs about 12 hours later. 30.6 g 3   Calcium  Carb-Cholecalciferol  (CALCIUM  600 + D PO) Take 1 tablet by mouth every 3 (three) days.     carvedilol  (COREG ) 6.25 MG tablet Take 2 tablets (12.5 mg total) by mouth 2 (two) times  daily with a meal. 120 tablet 5   Cholecalciferol  (VITAMIN D3) 50 MCG (2000 UT) TABS Take 2,000 Units by mouth daily.     fluticasone  (FLONASE ) 50 MCG/ACT nasal spray Place 2 sprays into both nostrils as needed for allergies or rhinitis.     hydrALAZINE  (APRESOLINE ) 100 MG tablet Take 1 tablet (100 mg total) by mouth every 8 (eight) hours. 90 tablet 0   ibuprofen  (ADVIL ) 200 MG tablet Take 200 mg by mouth every 6 (six) hours as needed for moderate pain (pain score 4-6).     isosorbide  mononitrate (IMDUR ) 60 MG 24 hr tablet Take 1 tablet (60 mg total) by mouth daily. 90 tablet 3   metolazone  (ZAROXOLYN ) 5 MG tablet Take 5 mg by mouth as directed. Only take when directed by doctor, will take 40 meq of potassium with each dose     potassium chloride  SA (KLOR-CON  M) 20 MEQ tablet Take 40 mEq by mouth 2 (two) times daily.     tamsulosin  (FLOMAX ) 0.4 MG CAPS capsule Take 1 capsule (0.4 mg total) by mouth daily. 90 capsule 3   torsemide  (DEMADEX ) 20 MG tablet Take 3 tablets (60 mg total) by mouth 2 (two) times daily. 540 tablet 3   No current facility-administered medications for this encounter.   Allergies  Allergen Reactions   Other Anaphylaxis    mushrooms   Social History   Socioeconomic History   Marital status: Divorced    Spouse name: Not on file   Number of children: 2   Years of education: 12   Highest education level: Not on file  Occupational History   Occupation: Drives dump truck    Comment:     Occupation: IT sales professional    Comment: Disabled  Tobacco Use   Smoking status: Former    Current packs/day: 0.00    Average packs/day: 0.3 packs/day for 1 year (0.3 ttl pk-yrs)    Types: Cigarettes    Start date: 07/23/1988    Quit date: 07/23/1989    Years since quitting: 34.0   Smokeless tobacco: Never   Tobacco comments:    Former smoker 03/06/2021  Vaping Use   Vaping status: Never Used  Substance and Sexual Activity   Alcohol use: No    Alcohol/week: 0.0 standard drinks of  alcohol   Drug use: No   Sexual activity: Not Currently  Other Topics Concern   Not on file  Social History Narrative   Pt lives in [place], alone.   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   Has 2 sons in [place]       No living will   Would want sons to make decisions for him   Would accept resuscitation  Social Drivers of Corporate investment banker Strain: Low Risk  (01/19/2018)   Overall Financial Resource Strain (CARDIA)    Difficulty of Paying Living Expenses: Not hard at all  Food Insecurity: No Food Insecurity (04/13/2023)   Hunger Vital Sign    Worried About Running Out of Food in the Last Year: Never true    Ran Out of Food in the Last Year: Never true  Transportation Needs: No Transportation Needs (04/13/2023)   PRAPARE - Administrator, Civil Service (Medical): No    Lack of Transportation (Non-Medical): No  Physical Activity: Not on file  Stress: Not on file  Social Connections: Not on file  Intimate Partner Violence: Not At Risk (04/13/2023)   Humiliation, Afraid, Rape, and Kick questionnaire    Fear of Current or Ex-Partner: No    Emotionally Abused: No    Physically Abused: No    Sexually Abused: No    Family History  Problem Relation Age of Onset   Hypertension Mother    Diabetes Father    CAD Father    Clotting disorder Father    Heart disease Father        transplant   Stomach cancer Father    Allergies Brother    Colon cancer Neg Hx    Colon polyps Neg Hx    Esophageal cancer Neg Hx    Rectal cancer Neg Hx    BP (!) 138/54   Pulse 79   Wt (!) 137.3 kg (302 lb 12.8 oz)   SpO2 99%   BMI 44.72 kg/m   Wt Readings from Last 3 Encounters:  07/27/23 (!) 137.3 kg (302 lb 12.8 oz)  05/26/23 135.5 kg (298 lb 12.8 oz)  04/24/23 (!) 139.3 kg (307 lb 3.2 oz)   PHYSICAL EXAM: General:  NAD. No resp difficulty, walked into clinic HEENT: Normal Neck: Supple. No JVD. Thick neck Cor: Regular rate & rhythm.  No rubs, gallops, 2/6 AI Lungs: Clear Abdomen: Soft, obese, nontender, nondistended.  Extremities: No cyanosis, clubbing, rash, edema Neuro: Alert & oriented x 3, moves all 4 extremities w/o difficulty. Affect pleasant.  ASSESSMENT & PLAN:  1. Chronic HFmEF - Echo 11/2016 LVEF 55-60%, Grade 2 DD. - EF 7/20 back down to 30%. Suspect due to AF but rate not overly elevated. HTN may also be playing a role. - Symptoms improved after DC-CV on 10/11/18  - Echo 11/20 EF 45-50% - Echo 12/23 EF 40-45%, global LV HK, RV ok. - Echo 1/25: EF 35-40%, RV ok - Echo 4/25: EF 40-45%, normal RV, moderate to severe AI - Has not had cath due to CKD and lack of clear anginal symptoms.  - Echo 4/25: EF 40-45%, RV ok, mod to severe AI - NYHA I-II. Volume ok.  - Continue torsemide  60 mg bid + 40 KCL bid.   - Continue Coreg  12.5 mg bid. - Continue hydralazine  100 mg tid + Imdur  60 mg daily. - Off Farxiga  due to AKI and refuses to restart due to cost. - No ACE/ARB with CKD (creatinine has run 3.0-3.5) - labs today.  2. PAF / Typical A-flutter, paroxysmal - S/P AFL ablation 07/2017. - s/p DC-CV for AFib on 10/14/18 and 05/10/19 and 9/22 - Amio started 9/22 - S/p AF ablation 7/23 by Dr. Helmut Lobe - He is compliant with CPAP. - Recurrent AF 2/25 -> DC-CV 05/11/23. Amio increased to 200 bid x 2 weeks  - 8 day Zio 3/25: mostly NSR, 2.2% PVC burden -  Continue Eliquis  5 mg bid. No bleeding - Continue amiodarone  200 mg daily. Amio labs UTD - Saw EP, not a re-do ablation candidate until weight closer to 250 lbs.    3. Hypertension - BP well controlled - Continue current regimen.  4. Morbid obesity - Body mass index is 44.72 kg/m.  - He has lost over 100 pounds. Congratulated him.  - Discussed GLP1 today, he is interested. - Check A1C and refer to PharmD for tirzepatide vs semaglutide (insurance may cover with OSA diagnosis)  5. CKD IV - Baseline SCr ~3.0-3.5. - Follows with Dr. Zelda Hickman.  - Labs today  6.  OSA on CPAP - Compliant with CPAP.  - No change  7. Iron  deficiency anemia - Getting Venofer   - Denies recent bleeding issues - Followed by Heme/Onc  8. Frequent PVCs - 8 day Zio 3/25 showed 2.2 PVC burden - Continue current Coreg  - Continue current amio - No change  9. AI - TEE 12/23 read as mild AI, AoV leaflets not coapting well. Reviewed images with Dr. Julane Ny, likely moderate AI - Echo 4/25 moderate AI, images reviewed with Dr. Julane Ny  - Follow  Follow up in 4 months with Dr. Julane Ny  Elmarie Hacking, FNP  10:30 AM

## 2023-07-24 NOTE — Telephone Encounter (Signed)
 Called to confirm/remind patient of their appointment at the [facility] on 07/27/23.   Appointment:   [x] Confirmed  [] Left mess   [] No answer/No voice mail  [] VM Full/unable to leave message  [] Phone not in service  Patient reminded to bring all medications and/or complete list.  Confirmed patient has transportation. Gave directions, instructed to utilize valet parking.

## 2023-07-27 ENCOUNTER — Telehealth (HOSPITAL_COMMUNITY): Payer: Self-pay

## 2023-07-27 ENCOUNTER — Encounter (HOSPITAL_COMMUNITY): Payer: Self-pay

## 2023-07-27 ENCOUNTER — Encounter: Payer: Self-pay | Admitting: Oncology

## 2023-07-27 ENCOUNTER — Other Ambulatory Visit (HOSPITAL_COMMUNITY): Payer: Self-pay

## 2023-07-27 ENCOUNTER — Ambulatory Visit (HOSPITAL_COMMUNITY)
Admission: RE | Admit: 2023-07-27 | Discharge: 2023-07-27 | Disposition: A | Discharge status: Home / Self Care | Source: Ambulatory Visit | Attending: Family Medicine | Admitting: Family Medicine

## 2023-07-27 VITALS — BP 138/54 | HR 79 | Wt 302.8 lb

## 2023-07-27 DIAGNOSIS — Z6841 Body Mass Index (BMI) 40.0 and over, adult: Secondary | ICD-10-CM | POA: Diagnosis not present

## 2023-07-27 DIAGNOSIS — Z7901 Long term (current) use of anticoagulants: Secondary | ICD-10-CM | POA: Insufficient documentation

## 2023-07-27 DIAGNOSIS — Z86718 Personal history of other venous thrombosis and embolism: Secondary | ICD-10-CM | POA: Diagnosis not present

## 2023-07-27 DIAGNOSIS — I480 Paroxysmal atrial fibrillation: Secondary | ICD-10-CM | POA: Diagnosis not present

## 2023-07-27 DIAGNOSIS — Z79899 Other long term (current) drug therapy: Secondary | ICD-10-CM | POA: Insufficient documentation

## 2023-07-27 DIAGNOSIS — I5022 Chronic systolic (congestive) heart failure: Secondary | ICD-10-CM | POA: Diagnosis not present

## 2023-07-27 DIAGNOSIS — D509 Iron deficiency anemia, unspecified: Secondary | ICD-10-CM | POA: Insufficient documentation

## 2023-07-27 DIAGNOSIS — I130 Hypertensive heart and chronic kidney disease with heart failure and stage 1 through stage 4 chronic kidney disease, or unspecified chronic kidney disease: Secondary | ICD-10-CM | POA: Diagnosis not present

## 2023-07-27 DIAGNOSIS — Z87891 Personal history of nicotine dependence: Secondary | ICD-10-CM | POA: Insufficient documentation

## 2023-07-27 DIAGNOSIS — N184 Chronic kidney disease, stage 4 (severe): Secondary | ICD-10-CM | POA: Insufficient documentation

## 2023-07-27 DIAGNOSIS — G4733 Obstructive sleep apnea (adult) (pediatric): Secondary | ICD-10-CM | POA: Insufficient documentation

## 2023-07-27 DIAGNOSIS — I483 Typical atrial flutter: Secondary | ICD-10-CM | POA: Diagnosis not present

## 2023-07-27 DIAGNOSIS — I493 Ventricular premature depolarization: Secondary | ICD-10-CM | POA: Insufficient documentation

## 2023-07-27 DIAGNOSIS — I10 Essential (primary) hypertension: Secondary | ICD-10-CM | POA: Diagnosis not present

## 2023-07-27 DIAGNOSIS — I351 Nonrheumatic aortic (valve) insufficiency: Secondary | ICD-10-CM

## 2023-07-27 LAB — BASIC METABOLIC PANEL WITH GFR
Anion gap: 8 (ref 5–15)
BUN: 64 mg/dL — ABNORMAL HIGH (ref 8–23)
CO2: 34 mmol/L — ABNORMAL HIGH (ref 22–32)
Calcium: 9.3 mg/dL (ref 8.9–10.3)
Chloride: 99 mmol/L (ref 98–111)
Creatinine, Ser: 4.43 mg/dL — ABNORMAL HIGH (ref 0.61–1.24)
GFR, Estimated: 14 mL/min — ABNORMAL LOW (ref >60)
Glucose, Bld: 73 mg/dL (ref 70–99)
Potassium: 3.7 mmol/L (ref 3.5–5.1)
Sodium: 141 mmol/L (ref 135–145)

## 2023-07-27 LAB — HEMOGLOBIN A1C
Hgb A1c MFr Bld: 5.2 % (ref 4.8–5.6)
Mean Plasma Glucose: 102.54 mg/dL

## 2023-07-27 LAB — BRAIN NATRIURETIC PEPTIDE: B Natriuretic Peptide: 566 pg/mL — ABNORMAL HIGH (ref 0.0–100.0)

## 2023-07-27 NOTE — Telephone Encounter (Signed)
 Advanced Heart Failure Patient Advocate Encounter  Prior authorization for Jardiance has been submitted and approved. Test billing returns $25 for 30 day supply. This insurance limits 30 day supply, and this patient is eligible to use copay savings cards.  [facility] Setting Effective: 06/27/2023 to 07/26/2024  Kennis Peacock, CPhT Rx Patient Advocate Phone: [phone]

## 2023-07-27 NOTE — Telephone Encounter (Signed)
 Advanced Heart Failure Patient Advocate Encounter  Test billing for this patients current coverage shows that this plan limits 30 day supply and has copays of $50 for Entresto, $25 foEliquis , and Jardiance requires prior authorization.  This patient is eligible to use copay cards with this plan to help reduce cost to patient.  Kennis Peacock, CPhT Rx Patient Advocate Phone: [phone]

## 2023-07-27 NOTE — Addendum Note (Signed)
 Encounter addended by: Nida Barrow, RN on: 07/27/2023 4:05 PM  Actions taken: Charge Capture section accepted

## 2023-07-27 NOTE — Patient Instructions (Addendum)
 Thank you for coming in today  If you had labs drawn today, any labs that are abnormal the clinic will call you No news is good news  Medications: You been referred  to pharmacy, their office will contact you for further appointment details   Follow up appointments:  Your physician recommends that you schedule a follow-up appointment in:  4 months With Dr. Julane Ny  Please call our office to schedule the follow-up appointment in July for August 2025   Do the following things EVERYDAY: Weigh yourself in the morning before breakfast. Write it down and keep it in a log. Take your medicines as prescribed Eat low salt foods--Limit salt (sodium) to 2000 mg per day.  Stay as active as you can everyday Limit all fluids for the day to less than 2 liters   At the [facility] Clinic, you and your health needs are our priority. As part of our continuing mission to provide you with exceptional heart care, we have created designated Provider Care Teams. These Care Teams include your primary Cardiologist (physician) and Advanced Practice Providers (APPs- Physician Assistants and Nurse Practitioners) who all work together to provide you with the care you need, when you need it.   You may see any of the following providers on your designated Care Team at your next follow up: Dr Jules Oar Dr Peder Bourdon Dr. Mimi Alt, NP Ruddy Corral, [place] [facility] [place], [place] Dennise Fitz, NP Luster Salters, PharmD   Please be sure to bring in all your medications bottles to every appointment.    Thank you for choosing [facility]  If you have any questions or concerns before your next appointment please send us  a message through [place] or call our office at [phone].    TO LEAVE A MESSAGE FOR THE NURSE SELECT OPTION 2, PLEASE LEAVE A MESSAGE INCLUDING: YOUR NAME DATE OF BIRTH CALL BACK NUMBER REASON FOR  CALL**this is important as we prioritize the call backs  YOU WILL RECEIVE A CALL BACK THE SAME DAY AS LONG AS YOU CALL BEFORE 4:00 PM

## 2023-07-28 ENCOUNTER — Other Ambulatory Visit (HOSPITAL_COMMUNITY): Payer: Self-pay | Admitting: Family Medicine

## 2023-07-28 DIAGNOSIS — Z79899 Other long term (current) drug therapy: Secondary | ICD-10-CM

## 2023-07-28 DIAGNOSIS — E876 Hypokalemia: Secondary | ICD-10-CM

## 2023-08-07 ENCOUNTER — Inpatient Hospital Stay: Payer: Managed Care, Other (non HMO) | Attending: Oncology

## 2023-08-07 ENCOUNTER — Inpatient Hospital Stay: Payer: Managed Care, Other (non HMO)

## 2023-08-07 ENCOUNTER — Encounter: Payer: Self-pay | Admitting: Oncology

## 2023-08-07 ENCOUNTER — Inpatient Hospital Stay: Payer: Managed Care, Other (non HMO) | Admitting: Oncology

## 2023-08-07 VITALS — BP 150/65 | HR 89 | Temp 96.8°F | Resp 18 | Wt 314.2 lb

## 2023-08-07 VITALS — BP 132/72 | HR 73

## 2023-08-07 DIAGNOSIS — D631 Anemia in chronic kidney disease: Secondary | ICD-10-CM | POA: Diagnosis present

## 2023-08-07 DIAGNOSIS — G4733 Obstructive sleep apnea (adult) (pediatric): Secondary | ICD-10-CM | POA: Diagnosis not present

## 2023-08-07 DIAGNOSIS — Z79899 Other long term (current) drug therapy: Secondary | ICD-10-CM | POA: Diagnosis not present

## 2023-08-07 DIAGNOSIS — I480 Paroxysmal atrial fibrillation: Secondary | ICD-10-CM | POA: Insufficient documentation

## 2023-08-07 DIAGNOSIS — Z86711 Personal history of pulmonary embolism: Secondary | ICD-10-CM | POA: Insufficient documentation

## 2023-08-07 DIAGNOSIS — M109 Gout, unspecified: Secondary | ICD-10-CM | POA: Diagnosis not present

## 2023-08-07 DIAGNOSIS — N184 Chronic kidney disease, stage 4 (severe): Secondary | ICD-10-CM

## 2023-08-07 DIAGNOSIS — I130 Hypertensive heart and chronic kidney disease with heart failure and stage 1 through stage 4 chronic kidney disease, or unspecified chronic kidney disease: Secondary | ICD-10-CM | POA: Diagnosis not present

## 2023-08-07 DIAGNOSIS — Z7901 Long term (current) use of anticoagulants: Secondary | ICD-10-CM | POA: Diagnosis not present

## 2023-08-07 DIAGNOSIS — Z8249 Family history of ischemic heart disease and other diseases of the circulatory system: Secondary | ICD-10-CM | POA: Insufficient documentation

## 2023-08-07 DIAGNOSIS — Z86718 Personal history of other venous thrombosis and embolism: Secondary | ICD-10-CM | POA: Diagnosis not present

## 2023-08-07 DIAGNOSIS — E538 Deficiency of other specified B group vitamins: Secondary | ICD-10-CM

## 2023-08-07 DIAGNOSIS — Z832 Family history of diseases of the blood and blood-forming organs and certain disorders involving the immune mechanism: Secondary | ICD-10-CM | POA: Diagnosis not present

## 2023-08-07 DIAGNOSIS — Z87891 Personal history of nicotine dependence: Secondary | ICD-10-CM | POA: Diagnosis not present

## 2023-08-07 DIAGNOSIS — Z833 Family history of diabetes mellitus: Secondary | ICD-10-CM | POA: Diagnosis not present

## 2023-08-07 DIAGNOSIS — I509 Heart failure, unspecified: Secondary | ICD-10-CM | POA: Diagnosis not present

## 2023-08-07 DIAGNOSIS — Z800 Family history of malignant neoplasm of digestive organs: Secondary | ICD-10-CM | POA: Insufficient documentation

## 2023-08-07 LAB — CBC WITH DIFFERENTIAL (CANCER CENTER ONLY)
Abs Immature Granulocytes: 0.02 10*3/uL (ref 0.00–0.07)
Basophils Absolute: 0 10*3/uL (ref 0.0–0.1)
Basophils Relative: 0 %
Eosinophils Absolute: 0.1 10*3/uL (ref 0.0–0.5)
Eosinophils Relative: 2 %
HCT: 33.8 % — ABNORMAL LOW (ref 39.0–52.0)
Hemoglobin: 10.7 g/dL — ABNORMAL LOW (ref 13.0–17.0)
Immature Granulocytes: 1 %
Lymphocytes Relative: 26 %
Lymphs Abs: 0.7 10*3/uL (ref 0.7–4.0)
MCH: 29.2 pg (ref 26.0–34.0)
MCHC: 31.7 g/dL (ref 30.0–36.0)
MCV: 92.3 fL (ref 80.0–100.0)
Monocytes Absolute: 0.3 10*3/uL (ref 0.1–1.0)
Monocytes Relative: 13 %
Neutro Abs: 1.5 10*3/uL — ABNORMAL LOW (ref 1.7–7.7)
Neutrophils Relative %: 58 %
Platelet Count: 110 10*3/uL — ABNORMAL LOW (ref 150–400)
RBC: 3.66 MIL/uL — ABNORMAL LOW (ref 4.22–5.81)
RDW: 15.5 % (ref 11.5–15.5)
WBC Count: 2.6 10*3/uL — ABNORMAL LOW (ref 4.0–10.5)
nRBC: 0 % (ref 0.0–0.2)

## 2023-08-07 LAB — FERRITIN: Ferritin: 168 ng/mL (ref 24–336)

## 2023-08-07 LAB — IRON AND TIBC
Iron: 58 ug/dL (ref 45–182)
Saturation Ratios: 23 % (ref 17.9–39.5)
TIBC: 255 ug/dL (ref 250–450)
UIBC: 197 ug/dL

## 2023-08-07 LAB — VITAMIN B12: Vitamin B-12: 254 pg/mL (ref 180–914)

## 2023-08-07 MED ORDER — IRON SUCROSE 20 MG/ML IV SOLN
200.0000 mg | Freq: Once | INTRAVENOUS | Status: AC
  Administered 2023-08-07: 200 mg via INTRAVENOUS
  Filled 2023-08-07: qty 10

## 2023-08-07 MED ORDER — CYANOCOBALAMIN 1000 MCG/ML IJ SOLN
1000.0000 ug | Freq: Once | INTRAMUSCULAR | Status: AC
  Administered 2023-08-07: 1000 ug via INTRAMUSCULAR
  Filled 2023-08-07: qty 1

## 2023-08-07 MED ORDER — SODIUM CHLORIDE 0.9% FLUSH
10.0000 mL | Freq: Once | INTRAVENOUS | Status: DC | PRN
Start: 2023-08-07 — End: 2023-08-07
  Filled 2023-08-07: qty 10

## 2023-08-07 NOTE — Progress Notes (Signed)
 Hematology/Oncology Progress note [phone] [phone]  CHIEF COMPLAINTS/REASON FOR VISIT:  anemia of chronic kidney disease, B12 deficiency.  ASSESSMENT & PLAN:   Anemia of chronic renal failure Anemia, likely secondary to chronic kidney disease Labs reviewed and discussed with patient. Lab Results  Component Value Date   HGB 10.7 (L) 08/07/2023   TIBC 255 08/07/2023   IRONPCTSAT 23 08/07/2023   FERRITIN 168 08/07/2023   Recommend additional Venofer  200mg  weekly x 2 Decrease of hemoglobin is likely due to worsen of kidney function.   Chronic kidney disease, stage IV (severe) (HCC) Cr is getting worse.  Recommend patient to make follow up appointment with nephrology  B12 deficiency B12 level is borderline.  Continue B12 IM injection monthly.    Orders Placed This Encounter  Procedures   CBC with Differential (Cancer Center Only)    Standing Status:   Future    Expected Date:   11/07/2023    Expiration Date:   08/06/2024   Iron  and TIBC    Standing Status:   Future    Expected Date:   11/07/2023    Expiration Date:   08/06/2024   Ferritin    Standing Status:   Future    Expected Date:   11/07/2023    Expiration Date:   08/06/2024   Retic Panel    Standing Status:   Future    Expected Date:   11/07/2023    Expiration Date:   08/06/2024   Vitamin B12    Standing Status:   Future    Expected Date:   11/07/2023    Expiration Date:   08/06/2024   Follow-up 6 months. All questions were answered. The patient knows to call the clinic with any problems, questions or concerns.  Phillip Forbes, MD, PhD [facility] Hematology Oncology 08/07/2023   HISTORY OF PRESENTING ILLNESS:   Phillip [name] is a  63 y.o.  male with PMH listed below was seen in consultation at the request of  Curt Dover I, MD  for evaluation of anemia of chronic kidney disease.  Patient has chronic kidney disease and follows up with Dr. Zelda Hickman.  Baseline creatinine 2.9. History of  pulmonary embolism [2010], left lower extremity DVT[2017], atrial fibrillation, currently on Xarelto  20 mg daily.  Managed by cardiology/CHF clinic Dr.Bensimhon.  Patient denies any melena, hematochezia.  07/16/2021 CBC showed hemoglobin 10.4, MCV 91, platelet count 10 9000, white count 2.6. Patient denies alcohol use.  He has intentionally lost weight. Patient was referred to establish care with hematology for further evaluation.  INTERVAL HISTORY Phillip [name] is a 63 y.o. male who has above history reviewed by me today presents for follow up visit for management of anemia due to chronic kidney disease, vitamin B12 deficiency. Patient reports feeling well. He is on Eliquis  5mg  BID for A-fib.  Follows up with [facility] cardiology. Chronic kidney disease.  He has not seen nephrology since last visit in 2023 No other new complaints.    Review of Systems  Constitutional:  Negative for appetite change, chills, fatigue, fever and unexpected weight change.  HENT:   Negative for hearing loss and voice change.   Eyes:  Negative for eye problems and icterus.  Respiratory:  Negative for chest tightness, cough and shortness of breath.   Cardiovascular:  Negative for chest pain and leg swelling.  Gastrointestinal:  Negative for abdominal distention and abdominal pain.  Endocrine: Negative for hot flashes.  Genitourinary:  Negative for difficulty urinating, dysuria and frequency.  Musculoskeletal:  Negative for arthralgias.  Skin:  Negative for itching and rash.  Neurological:  Negative for light-headedness and numbness.  Hematological:  Negative for adenopathy. Does not bruise/bleed easily.  Psychiatric/Behavioral:  Negative for confusion.     MEDICAL HISTORY:  Past Medical History:  Diagnosis Date   Asthma    CHF (congestive heart failure) (HCC) 11/15   EF 40-45%, improved with follow-up   Chronic kidney disease    COPD (chronic obstructive pulmonary disease) (HCC)    Deep vein thrombosis (DVT) of  lower extremity (HCC) 08/17/2015   DVT (deep venous thrombosis) (HCC)    Gout    Hypertension    Hypertensive cardiovascular disease    Kidney dysfunction    Morbid obesity (HCC)    OSA on CPAP    Paroxysmal atrial fibrillation (HCC)    PNA (pneumonia)    Pulmonary embolism (HCC) 2010   left leg DVT   Sleep apnea    uses cpap    SURGICAL HISTORY: Past Surgical History:  Procedure Laterality Date   A-FLUTTER ABLATION N/A 07/22/2017   Procedure: A-FLUTTER ABLATION;  Surgeon: Jolly Needle, MD;  [facility]: [facility] INVASIVE CV LAB;  Service: Cardiovascular;  Laterality: N/A;   CARDIOVERSION N/A 10/14/2018   Procedure: CARDIOVERSION;  Surgeon: Mardell Shade, MD;  Location: [facility] ENDOSCOPY;  Service: Cardiovascular;  Laterality: N/A;   CARDIOVERSION N/A 04/22/2019   Procedure: CARDIOVERSION;  Surgeon: Lake Pilgrim, MD;  Location: [facility] ENDOSCOPY;  Service: Cardiovascular;  Laterality: N/A;   CARDIOVERSION N/A 05/10/2019   Procedure: CARDIOVERSION;  Surgeon: Mardell Shade, MD;  Location: [facility] ENDOSCOPY;  Service: Cardiovascular;  Laterality: N/A;   CARDIOVERSION N/A 11/23/2020   Procedure: CARDIOVERSION;  Surgeon: Hazle Lites, MD;  Location: [facility] ENDOSCOPY;  Service: Cardiovascular;  Laterality: N/A;   CARDIOVERSION N/A 04/16/2021   Procedure: CARDIOVERSION;  Surgeon: Jerryl Morin, DO;  Location: [facility] ENDOSCOPY;  Service: Cardiovascular;  Laterality: N/A;   CARDIOVERSION N/A 05/11/2023   Procedure: CARDIOVERSION;  Surgeon: Mardell Shade, MD;  Location: [facility] INVASIVE CV LAB;  Service: Cardiovascular;  Laterality: N/A;   COLONOSCOPY WITH PROPOFOL  N/A 12/20/2019   Procedure: COLONOSCOPY WITH PROPOFOL ;  Surgeon: Ace Holder, MD;  Location: [facility] ENDOSCOPY;  Service: Gastroenterology;  Laterality: N/A;   POLYPECTOMY  12/20/2019   Procedure: POLYPECTOMY;  Surgeon: Ace Holder, MD;  Location: [facility] ENDOSCOPY;  Service: Gastroenterology;;   TEE WITHOUT CARDIOVERSION N/A 03/05/2022    Procedure: TRANSESOPHAGEAL ECHOCARDIOGRAM (TEE);  Surgeon: Bridgette Campus, MD;  Location: [facility] ENDOSCOPY;  Service: Cardiovascular;  Laterality: N/A;   UMBILICAL HERNIA REPAIR  1992    SOCIAL HISTORY: Social History   Socioeconomic History   Marital status: Divorced    Spouse name: Not on file   Number of children: 2   Years of education: 12   Highest education level: Not on file  Occupational History   Occupation: Drives dump truck    Comment:     Occupation: IT sales professional    Comment: Disabled  Tobacco Use   Smoking status: Former    Current packs/day: 0.00    Average packs/day: 0.3 packs/day for 1 year (0.3 ttl pk-yrs)    Types: Cigarettes    Start date: 07/23/1988    Quit date: 07/23/1989    Years since quitting: 34.0   Smokeless tobacco: Never   Tobacco comments:    Former smoker 03/06/2021  Vaping Use   Vaping status: Never Used  Substance and Sexual Activity   Alcohol use: No  Alcohol/week: 0.0 standard drinks of alcohol   Drug use: No   Sexual activity: Not Currently  Other Topics Concern   Not on file  Social History Narrative   Pt lives in [place], alone.   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   Has 2 sons in [place]       No living will   Would want sons to make decisions for him   Would accept resuscitation   Social Drivers of Health   Financial Resource Strain: Low Risk  (01/19/2018)   Overall Financial Resource Strain (CARDIA)    Difficulty of Paying Living Expenses: Not hard at all  Food Insecurity: No Food Insecurity (04/13/2023)   Hunger Vital Sign    Worried About Running Out of Food in the Last Year: Never true    Ran Out of Food in the Last Year: Never true  Transportation Needs: No Transportation Needs (04/13/2023)   PRAPARE - Administrator, Civil Service (Medical): No    Lack of Transportation (Non-Medical): No  Physical Activity: Not on file  Stress: Not on file  Social Connections: Not on file   Intimate Partner Violence: Not At Risk (04/13/2023)   Humiliation, Afraid, Rape, and Kick questionnaire    Fear of Current or Ex-Partner: No    Emotionally Abused: No    Physically Abused: No    Sexually Abused: No    FAMILY HISTORY: Family History  Problem Relation Age of Onset   Hypertension Mother    Diabetes Father    CAD Father    Clotting disorder Father    Heart disease Father        transplant   Stomach cancer Father    Allergies Brother    Colon cancer Neg Hx    Colon polyps Neg Hx    Esophageal cancer Neg Hx    Rectal cancer Neg Hx     ALLERGIES:  is allergic to other.  MEDICATIONS:  Current Outpatient Medications  Medication Sig Dispense Refill   albuterol  (PROAIR  HFA) 108 (90 Base) MCG/ACT inhaler Inhale 2 puffs into the lungs every 4 (four) hours as needed for wheezing or shortness of breath. 1 Inhaler 3   allopurinol  (ZYLOPRIM ) 300 MG tablet TAKE 1 TABLET BY MOUTH EVERY DAY 90 tablet 3   amiodarone  (PACERONE ) 200 MG tablet Take 1 tablet (200 mg total) by mouth daily. 90 tablet 3   apixaban  (ELIQUIS ) 5 MG TABS tablet Take 1 tablet (5 mg total) by mouth 2 (two) times daily. 180 tablet 2   budesonide -formoterol  (SYMBICORT ) 80-4.5 MCG/ACT inhaler Take 2 puffs first thing in am and then another 2 puffs about 12 hours later. 30.6 g 3   Calcium  Carb-Cholecalciferol  (CALCIUM  600 + D PO) Take 1 tablet by mouth every 3 (three) days.     carvedilol  (COREG ) 6.25 MG tablet Take 2 tablets (12.5 mg total) by mouth 2 (two) times daily with a meal. 120 tablet 5   Cholecalciferol  (VITAMIN D3) 50 MCG (2000 UT) TABS Take 2,000 Units by mouth daily.     fluticasone  (FLONASE ) 50 MCG/ACT nasal spray Place 2 sprays into both nostrils as needed for allergies or rhinitis.     hydrALAZINE  (APRESOLINE ) 100 MG tablet Take 1 tablet (100 mg total) by mouth every 8 (eight) hours. 90 tablet 0   ibuprofen  (ADVIL ) 200 MG tablet Take 200 mg by mouth every 6 (six) hours as needed for moderate pain  (pain score 4-6).  isosorbide  mononitrate (IMDUR ) 60 MG 24 hr tablet Take 1 tablet (60 mg total) by mouth daily. 90 tablet 3   KLOR-CON  M20 20 MEQ tablet TAKE 2 TABLETS BY MOUTH 2 TIMES DAILY. 360 tablet 3   metolazone  (ZAROXOLYN ) 5 MG tablet Take 5 mg by mouth as directed. Only take when directed by doctor, will take 40 meq of potassium with each dose     tamsulosin  (FLOMAX ) 0.4 MG CAPS capsule Take 1 capsule (0.4 mg total) by mouth daily. 90 capsule 3   torsemide  (DEMADEX ) 20 MG tablet Take 3 tablets (60 mg total) by mouth 2 (two) times daily. 540 tablet 3   benzonatate  (TESSALON ) 200 MG capsule Take 1 capsule (200 mg total) by mouth 2 (two) times daily as needed for cough. (Patient not taking: Reported on 08/07/2023) 20 capsule 0   No current facility-administered medications for this visit.     PHYSICAL EXAMINATION: ECOG PERFORMANCE STATUS: 1 - Symptomatic but completely ambulatory Vitals:   08/07/23 1050  BP: (!) 150/65  Pulse: 89  Resp: 18  Temp: (!) 96.8 F (36 C)  SpO2: 97%   Filed Weights   08/07/23 1050  Weight: (!) 314 lb 3.2 oz (142.5 kg)    Physical Exam Constitutional:      General: He is not in acute distress.    Appearance: He is obese.  HENT:     Head: Normocephalic and atraumatic.  Eyes:     General: No scleral icterus. Cardiovascular:     Rate and Rhythm: Normal rate and regular rhythm.     Heart sounds: Normal heart sounds.  Pulmonary:     Effort: Pulmonary effort is normal. No respiratory distress.     Breath sounds: No wheezing.  Abdominal:     General: Bowel sounds are normal. There is no distension.     Palpations: Abdomen is soft.  Musculoskeletal:        General: No deformity. Normal range of motion.     Cervical back: Normal range of motion and neck supple.  Skin:    General: Skin is warm and dry.     Findings: No erythema or rash.  Neurological:     Mental Status: He is alert and oriented to person, place, and time. Mental status is at  baseline.     Cranial Nerves: No cranial nerve deficit.  Psychiatric:        Mood and Affect: Mood normal.     LABORATORY DATA:  I have reviewed the data as listed    Latest Ref Rng & Units 08/07/2023    7:53 AM 05/08/2023    8:29 AM 04/24/2023   10:26 AM  CBC  WBC 4.0 - 10.5 K/uL 2.6  2.9  3.2   Hemoglobin 13.0 - 17.0 g/dL 54.6  27.0  35.0   Hematocrit 39.0 - 52.0 % 33.8  38.6  37.8   Platelets 150 - 400 K/uL 110  118  121     Recent Labs    02/27/23 0858 02/27/23 0858 04/02/23 0920 04/13/23 0727 04/14/23 0358 05/15/23 0901 05/26/23 0935 07/27/23 1103  NA 144  --  142 142   < > 142 143 141  K 3.9  --  3.9 4.0   < > 4.1 3.8 3.7  CL 103  --  105 106   < > 104 98 99  CO2 37*  --  28 29   < > 30 32 34*  GLUCOSE 83  --  95 83   < >  133* 96 73  BUN 52*  --  41* 45*   < > 44* 63* 64*  CREATININE 3.06*  --  3.10* 2.92*   < > 3.45* 4.36* 4.43*  CALCIUM  8.8  --  9.0 8.9   < > 9.1 9.6 9.3  GFRNONAA  --    < > 22* 24*   < > 19* 15* 14*  PROT 5.7*  --  6.2* 6.7  --   --   --   --   ALBUMIN  3.6  3.6  --  3.5 3.8  --   --   --   --   AST 19  --  19 27  --   --   --   --   ALT 16  --  17 24  --   --   --   --   ALKPHOS 85  --  85 92  --   --   --   --   BILITOT 0.9  --  0.9 0.9  --   --   --   --   BILIDIR 0.2  --   --  0.3*  --   --   --   --   IBILI  --   --   --  0.6  --   --   --   --    < > = values in this interval not displayed.   Iron /TIBC/Ferritin/ %Sat    Component Value Date/Time   IRON  58 08/07/2023 0753   IRON  74 03/23/2017 1133   TIBC 255 08/07/2023 0753   TIBC 334 03/23/2017 1133   FERRITIN 168 08/07/2023 0753   IRONPCTSAT 23 08/07/2023 0753   IRONPCTSAT 22 03/23/2017 1133      RADIOGRAPHIC STUDIES: I have personally reviewed the radiological images as listed and agreed with the findings in the report. No results found.

## 2023-08-07 NOTE — Assessment & Plan Note (Signed)
 B12 level is borderline.  Continue B12 IM injection monthly.

## 2023-08-07 NOTE — Patient Instructions (Signed)

## 2023-08-07 NOTE — Assessment & Plan Note (Addendum)
 Anemia, likely secondary to chronic kidney disease Labs reviewed and discussed with patient. Lab Results  Component Value Date   HGB 10.7 (L) 08/07/2023   TIBC 255 08/07/2023   IRONPCTSAT 23 08/07/2023   FERRITIN 168 08/07/2023   Recommend additional Venofer  200mg  weekly x 2 Decrease of hemoglobin is likely due to worsen of kidney function.

## 2023-08-07 NOTE — Assessment & Plan Note (Signed)
 Cr is getting worse.  Recommend patient to make follow up appointment with nephrology

## 2023-08-07 NOTE — Progress Notes (Signed)
Patient tolerated Venofer infusion well, no questions/concerns voiced. Monitored 30 min post transfusion. Patient stable at discharge. VSS. AVS given.

## 2023-08-10 ENCOUNTER — Telehealth: Payer: Self-pay

## 2023-08-10 NOTE — Telephone Encounter (Signed)
 Pt has been scheduled.

## 2023-08-10 NOTE — Telephone Encounter (Signed)
-----   Message from [place] E sent at 08/10/2023  8:32 AM EDT ----- Appts added ----- Message ----- From: [name], MD Sent: 08/07/2023   8:55 PM EDT To: Craven Do, RN; Ronell Coe, CMA; #  His B12 level came back as borderline normal. Please add B12 injection in 4 weeks and 8 weeks

## 2023-08-14 ENCOUNTER — Telehealth: Payer: Self-pay | Admitting: *Deleted

## 2023-08-14 ENCOUNTER — Inpatient Hospital Stay

## 2023-08-14 VITALS — BP 140/70 | HR 72 | Temp 96.9°F | Resp 18

## 2023-08-14 DIAGNOSIS — E538 Deficiency of other specified B group vitamins: Secondary | ICD-10-CM

## 2023-08-14 DIAGNOSIS — I130 Hypertensive heart and chronic kidney disease with heart failure and stage 1 through stage 4 chronic kidney disease, or unspecified chronic kidney disease: Secondary | ICD-10-CM | POA: Diagnosis not present

## 2023-08-14 MED ORDER — IRON SUCROSE 20 MG/ML IV SOLN
200.0000 mg | Freq: Once | INTRAVENOUS | Status: AC
  Administered 2023-08-14: 200 mg via INTRAVENOUS
  Filled 2023-08-14: qty 10

## 2023-08-14 NOTE — Telephone Encounter (Signed)
 The patient called in saying if he needs to have any labs.  He knows he has an infusion at 1:15 today.  I called him and let him know the only thing he has is the infusion for today.  He was thankful

## 2023-08-19 ENCOUNTER — Encounter (HOSPITAL_COMMUNITY)

## 2023-08-23 ENCOUNTER — Other Ambulatory Visit (HOSPITAL_COMMUNITY): Payer: Self-pay | Admitting: Physician Assistant

## 2023-08-24 ENCOUNTER — Other Ambulatory Visit (HOSPITAL_COMMUNITY): Payer: Self-pay

## 2023-08-24 ENCOUNTER — Telehealth (HOSPITAL_COMMUNITY): Payer: Self-pay

## 2023-08-24 MED ORDER — APIXABAN 5 MG PO TABS: 5.0000 mg | ORAL_TABLET | Freq: Two times a day (BID) | ORAL | 0 refills | Status: DC

## 2023-08-24 NOTE — Telephone Encounter (Signed)
 Patient called requesting a sample of Eliquis  while he awaits his mail order. Sample was placed up front for patient pickup   Medication Samples have been provided to the patient.  Drug name: Eliquis        Strength: 5mg         Qty: 1  LOT: [id number]  Exp.Date: 08/2024  Dosing instructions: take 1 tab po bid  The patient has been instructed regarding the correct time, dose, and frequency of taking this medication, including desired effects and most common side effects.   Vangie Henthorn R Saiquan Hands 2:18 PM 08/24/2023

## 2023-09-01 ENCOUNTER — Other Ambulatory Visit (HOSPITAL_COMMUNITY): Payer: Self-pay | Admitting: Internal Medicine

## 2023-09-01 DIAGNOSIS — I5042 Chronic combined systolic (congestive) and diastolic (congestive) heart failure: Secondary | ICD-10-CM

## 2023-09-07 ENCOUNTER — Inpatient Hospital Stay: Attending: Oncology

## 2023-09-07 DIAGNOSIS — D631 Anemia in chronic kidney disease: Secondary | ICD-10-CM | POA: Diagnosis present

## 2023-09-07 DIAGNOSIS — E538 Deficiency of other specified B group vitamins: Secondary | ICD-10-CM | POA: Insufficient documentation

## 2023-09-07 DIAGNOSIS — Z79899 Other long term (current) drug therapy: Secondary | ICD-10-CM | POA: Diagnosis not present

## 2023-09-07 DIAGNOSIS — N184 Chronic kidney disease, stage 4 (severe): Secondary | ICD-10-CM | POA: Insufficient documentation

## 2023-09-07 MED ORDER — CYANOCOBALAMIN 1000 MCG/ML IJ SOLN
1000.0000 ug | Freq: Once | INTRAMUSCULAR | Status: AC
  Administered 2023-09-07: 1000 ug via INTRAMUSCULAR
  Filled 2023-09-07: qty 1

## 2023-09-21 ENCOUNTER — Other Ambulatory Visit (HOSPITAL_COMMUNITY): Payer: Self-pay | Admitting: Physician Assistant

## 2023-09-21 ENCOUNTER — Ambulatory Visit: Admitting: Pharmacist Clinician (PhC)/ Clinical Pharmacy Specialist

## 2023-09-21 NOTE — Progress Notes (Deleted)
 Office Visit    Patient Name: Phillip [name] Date of Encounter: 09/21/2023  Primary Care Provider:  Jimmy Charlie FERNS, MD Primary Cardiologist:  Toribio Fuel, MD  Chief Complaint    Weight management  Significant Past Medical History   OSA   CHF              Allergies  Allergen Reactions   Other Anaphylaxis    mushrooms    History of Present Illness    Phillip [name] is a 63 y.o. male patient of Dr Fuel, in the office today to discuss options for weight management.    Peak weight 2021 176 kg, now 142   Current weight management medications:   Previously tried meds:   Current meds that may affect weight:   Baseline weight/BMI:   Insurance payor:   Diet:   Exercise:   Family History:   Confirmed patient not ***pregnant and no personal or family history of medullary thyroid  carcinoma (MTC) or Multiple Endocrine Neoplasia syndrome type 2 (MEN 2).   Social History:   Tobacco:  Alcohol:  Caffeine:   Adherence Assessment  Do you ever forget to take your medication? [] Yes [] No  Do you ever skip doses due to side effects? [] Yes [] No  Do you have trouble affording your medicines? [] Yes [] No  Are you ever unable to pick up your medication due to transportation difficulties? [] Yes [] No  Do you ever stop taking your medications because you don't believe they are helping? [] Yes [] No  Do you check your weight daily? [] Yes [] No   Adherence strategy: ***  Barriers to obtaining medications: ***   Accessory Clinical Findings    Lab Results  Component Value Date   CREATININE 4.43 (H) 07/27/2023   BUN 64 (H) 07/27/2023   NA 141 07/27/2023   K 3.7 07/27/2023   CL 99 07/27/2023   CO2 34 (H) 07/27/2023   Lab Results  Component Value Date   ALT 24 04/13/2023   AST 27 04/13/2023   ALKPHOS 92 04/13/2023   BILITOT 0.9 04/13/2023   Lab Results  Component Value Date   HGBA1C 5.2 07/27/2023      Home Medications/Allergies    Current  Outpatient Medications  Medication Sig Dispense Refill   albuterol  (PROAIR  HFA) 108 (90 Base) MCG/ACT inhaler Inhale 2 puffs into the lungs every 4 (four) hours as needed for wheezing or shortness of breath. 1 Inhaler 3   allopurinol  (ZYLOPRIM ) 300 MG tablet TAKE 1 TABLET BY MOUTH EVERY DAY 90 tablet 3   amiodarone  (PACERONE ) 200 MG tablet Take 1 tablet (200 mg total) by mouth daily. 90 tablet 3   apixaban  (ELIQUIS ) 5 MG TABS tablet Take 1 tablet (5 mg total) by mouth 2 (two) times daily. 60 tablet 0   benzonatate  (TESSALON ) 200 MG capsule Take 1 capsule (200 mg total) by mouth 2 (two) times daily as needed for cough. (Patient not taking: Reported on 08/07/2023) 20 capsule 0   budesonide -formoterol  (SYMBICORT ) 80-4.5 MCG/ACT inhaler Take 2 puffs first thing in am and then another 2 puffs about 12 hours later. 30.6 g 3   Calcium  Carb-Cholecalciferol  (CALCIUM  600 + D PO) Take 1 tablet by mouth every 3 (three) days.     carvedilol  (COREG ) 6.25 MG tablet Take 2 tablets (12.5 mg total) by mouth 2 (two) times daily with a meal. 120 tablet 5   Cholecalciferol  (VITAMIN D3) 50 MCG (2000 UT) TABS Take 2,000 Units by mouth daily.  fluticasone  (FLONASE ) 50 MCG/ACT nasal spray Place 2 sprays into both nostrils as needed for allergies or rhinitis.     hydrALAZINE  (APRESOLINE ) 100 MG tablet Take 1 tablet (100 mg total) by mouth every 8 (eight) hours. 90 tablet 0   ibuprofen  (ADVIL ) 200 MG tablet Take 200 mg by mouth every 6 (six) hours as needed for moderate pain (pain score 4-6).     isosorbide  mononitrate (IMDUR ) 60 MG 24 hr tablet Take 1 tablet (60 mg total) by mouth daily. 90 tablet 3   KLOR-CON  M20 20 MEQ tablet TAKE 2 TABLETS BY MOUTH 2 TIMES DAILY. 360 tablet 3   metolazone  (ZAROXOLYN ) 5 MG tablet Take 5 mg by mouth as directed. Only take when directed by doctor, will take 40 meq of potassium with each dose     tamsulosin  (FLOMAX ) 0.4 MG CAPS capsule Take 1 capsule (0.4 mg total) by mouth daily. 90  capsule 3   torsemide  (DEMADEX ) 20 MG tablet TAKE 3 TABLETS TWICE A DAY 540 tablet 3   No current facility-administered medications for this visit.     Allergies  Allergen Reactions   Other Anaphylaxis    mushrooms    Assessment & Plan    No problem-specific Assessment & Plan notes found for this encounter.   Trevia Nop PharmD CPP [facility]  [address] [place], [place] [address] [phone]

## 2023-09-26 ENCOUNTER — Emergency Department (HOSPITAL_COMMUNITY)
Admission: EM | Admit: 2023-09-26 | Discharge: 2023-09-27 | Disposition: A | Discharge status: Home / Self Care | Attending: Emergency Medicine | Admitting: Emergency Medicine

## 2023-09-26 ENCOUNTER — Encounter (HOSPITAL_COMMUNITY): Payer: Self-pay

## 2023-09-26 ENCOUNTER — Other Ambulatory Visit: Payer: Self-pay

## 2023-09-26 DIAGNOSIS — N184 Chronic kidney disease, stage 4 (severe): Secondary | ICD-10-CM

## 2023-09-26 DIAGNOSIS — I130 Hypertensive heart and chronic kidney disease with heart failure and stage 1 through stage 4 chronic kidney disease, or unspecified chronic kidney disease: Secondary | ICD-10-CM | POA: Insufficient documentation

## 2023-09-26 DIAGNOSIS — R42 Dizziness and giddiness: Secondary | ICD-10-CM | POA: Diagnosis present

## 2023-09-26 DIAGNOSIS — Z7951 Long term (current) use of inhaled steroids: Secondary | ICD-10-CM | POA: Insufficient documentation

## 2023-09-26 DIAGNOSIS — J449 Chronic obstructive pulmonary disease, unspecified: Secondary | ICD-10-CM | POA: Insufficient documentation

## 2023-09-26 DIAGNOSIS — N189 Chronic kidney disease, unspecified: Secondary | ICD-10-CM | POA: Diagnosis not present

## 2023-09-26 DIAGNOSIS — Z7901 Long term (current) use of anticoagulants: Secondary | ICD-10-CM | POA: Diagnosis not present

## 2023-09-26 DIAGNOSIS — Z79899 Other long term (current) drug therapy: Secondary | ICD-10-CM | POA: Diagnosis not present

## 2023-09-26 DIAGNOSIS — I5042 Chronic combined systolic (congestive) and diastolic (congestive) heart failure: Secondary | ICD-10-CM | POA: Diagnosis not present

## 2023-09-26 NOTE — ED Triage Notes (Signed)
 Pt reports dizziness and nausea since 1730 tonight.

## 2023-09-27 ENCOUNTER — Ambulatory Visit (HOSPITAL_COMMUNITY)

## 2023-09-27 ENCOUNTER — Emergency Department (HOSPITAL_COMMUNITY)

## 2023-09-27 LAB — CBC
HCT: 34.4 % — ABNORMAL LOW (ref 39.0–52.0)
Hemoglobin: 10.5 g/dL — ABNORMAL LOW (ref 13.0–17.0)
MCH: 29.2 pg (ref 26.0–34.0)
MCHC: 30.5 g/dL (ref 30.0–36.0)
MCV: 95.8 fL (ref 80.0–100.0)
Platelets: 109 K/uL — ABNORMAL LOW (ref 150–400)
RBC: 3.59 MIL/uL — ABNORMAL LOW (ref 4.22–5.81)
RDW: 15.7 % — ABNORMAL HIGH (ref 11.5–15.5)
WBC: 2.6 K/uL — ABNORMAL LOW (ref 4.0–10.5)
nRBC: 0 % (ref 0.0–0.2)

## 2023-09-27 LAB — BASIC METABOLIC PANEL WITH GFR
Anion gap: 9 (ref 5–15)
BUN: 66 mg/dL — ABNORMAL HIGH (ref 8–23)
CO2: 26 mmol/L (ref 22–32)
Calcium: 9 mg/dL (ref 8.9–10.3)
Chloride: 104 mmol/L (ref 98–111)
Creatinine, Ser: 3.73 mg/dL — ABNORMAL HIGH (ref 0.61–1.24)
GFR, Estimated: 18 mL/min — ABNORMAL LOW (ref >60)
Glucose, Bld: 97 mg/dL (ref 70–99)
Potassium: 3.9 mmol/L (ref 3.5–5.1)
Sodium: 139 mmol/L (ref 135–145)

## 2023-09-27 LAB — ETHANOL: Alcohol, Ethyl (B): <15 mg/dL (ref <15)

## 2023-09-27 LAB — APTT: aPTT: 38 s — ABNORMAL HIGH (ref 24–36)

## 2023-09-27 LAB — TROPONIN I (HIGH SENSITIVITY)
Troponin I (High Sensitivity): 56 ng/L — ABNORMAL HIGH (ref <18)
Troponin I (High Sensitivity): 57 ng/L — ABNORMAL HIGH (ref <18)

## 2023-09-27 LAB — PROTIME-INR
INR: 1.2 (ref 0.8–1.2)
Prothrombin Time: 16.3 s — ABNORMAL HIGH (ref 11.4–15.2)

## 2023-09-27 MED ORDER — DIAZEPAM 5 MG/ML IJ SOLN
5.0000 mg | Freq: Once | INTRAMUSCULAR | Status: AC
  Administered 2023-09-27: 5 mg via INTRAVENOUS
  Filled 2023-09-27: qty 2

## 2023-09-27 NOTE — ED Provider Notes (Signed)
 Pt signed out by Dr. Trine pending MRI.  Pt was unable to do the MRI due to claustrophobia.  He said he feels much better now and wants to go home.  He was put on oxygen  sometime during the night when he was asleep and O2 sats dropped to the upper 80s.  He is not normally on oxygen .  Pt said he's supposed to wear CPAP at night and did not have it here.  He is oxygenating well now off oxygen .  He does know to return if worse.  F/u with pcp.   Dean Clarity, MD 09/27/23 1018

## 2023-09-27 NOTE — ED Notes (Signed)
 Patient transported to MRI

## 2023-09-27 NOTE — ED Provider Notes (Signed)
[facility] AT [facility] Provider Note  CSN: [id number] Arrival date & time: 09/26/23 2253  Chief Complaint(s) No chief complaint on file.  HPI Phillip [name] is a 63 y.o. male     Dizziness Quality:  Imbalance and room spinning Duration: onset 5p. Timing:  Constant Chronicity:  New Relieved by:  Nothing Worsened by:  Movement Associated symptoms: nausea and vomiting   Associated symptoms: no chest pain, no headaches, no palpitations, no shortness of breath, no tinnitus and no vision changes    Compliant with all meds. No missed doses.   Past Medical History Past Medical History:  Diagnosis Date   Asthma    CHF (congestive heart failure) (HCC) 11/15   EF 40-45%, improved with follow-up   Chronic kidney disease    COPD (chronic obstructive pulmonary disease) (HCC)    Deep vein thrombosis (DVT) of lower extremity (HCC) 08/17/2015   DVT (deep venous thrombosis) (HCC)    Gout    Hypertension    Hypertensive cardiovascular disease    Kidney dysfunction    Morbid obesity (HCC)    OSA on CPAP    Paroxysmal atrial fibrillation (HCC)    PNA (pneumonia)    Pulmonary embolism (HCC) 2010   left leg DVT   Sleep apnea    uses cpap   Patient Active Problem List   Diagnosis Date Noted   Acute on chronic systolic CHF (congestive heart failure) (HCC) 04/14/2023   Acute CHF (congestive heart failure) (HCC) 04/13/2023   Acute on chronic combined systolic and diastolic CHF (congestive heart failure) (HCC) 04/13/2023   Encounter for monitoring amiodarone  therapy 04/02/2023   Abnormal pigmentation of conjunctiva 02/27/2023   Benign enlargement of prostate 02/27/2023   Hypercoagulable state due to persistent atrial fibrillation (HCC) 09/29/2022   Aortic regurgitation 08/12/2022   Anemia of chronic renal failure 07/29/2021   Thrombocytopenia (HCC) 07/29/2021   Leucopenia 07/29/2021   B12 deficiency 07/29/2021   Asthmatic bronchitis 02/26/2021    Preventative health care 03/09/2020   Benign neoplasm of ascending colon    Hyperlipidemia LDL goal <70 05/15/2019   Persistent atrial fibrillation (HCC)    Cough 10/15/2018   Asthma with COPD (HCC)    Chronic diastolic heart failure (HCC)    Anticoagulated 08/17/2015   Hx pulmonary embolism 08/17/2015   Paroxysmal atrial fibrillation (HCC) 08/04/2015   SVT (supraventricular tachycardia) (HCC) 06/22/2015   Morbid obesity (HCC) 03/07/2015   Chronic combined systolic and diastolic CHF (congestive heart failure) (HCC) 03/07/2015   Essential hypertension    Gout    Mild persistent asthma in adult without complication 05/06/2014   OSA on CPAP 05/06/2014   Chronic kidney disease, stage IV (severe) (HCC) 05/06/2014   Home Medication(s) Prior to Admission medications   Medication Sig Start Date End Date Taking? Authorizing Provider  albuterol  (PROAIR  HFA) 108 (90 Base) MCG/ACT inhaler Inhale 2 puffs into the lungs every 4 (four) hours as needed for wheezing or shortness of breath. 05/14/18  Yes Darlean Ozell NOVAK, MD  allopurinol  (ZYLOPRIM ) 300 MG tablet TAKE 1 TABLET BY MOUTH EVERY DAY 06/18/23  Yes Jimmy Charlie FERNS, MD  amiodarone  (PACERONE ) 200 MG tablet Take 1 tablet (200 mg total) by mouth daily. Patient taking differently: Take 100 mg by mouth daily. 05/09/23  Yes Milford, Harlene HERO, FNP  apixaban  (ELIQUIS ) 5 MG TABS tablet TAKE 1 TABLET BY MOUTH TWICE A DAY 09/21/23  Yes Fenton, Clint R, PA  budesonide -formoterol  (SYMBICORT ) 80-4.5 MCG/ACT inhaler Take 2 puffs  first thing in am and then another 2 puffs about 12 hours later. 04/16/23  Yes Darlean Ozell NOVAK, MD  Calcium  Carb-Cholecalciferol  (CALCIUM  600 + D PO) Take 1 tablet by mouth every 3 (three) days.   Yes [provider]  carvedilol  (COREG ) 6.25 MG tablet Take 2 tablets (12.5 mg total) by mouth 2 (two) times daily with a meal. 05/06/23 11/02/23 Yes Milford, Harlene HERO, FNP  Cholecalciferol  (VITAMIN D3) 50 MCG (2000 UT) TABS Take 2,000  Units by mouth daily.   Yes [provider]  fluticasone  (FLONASE ) 50 MCG/ACT nasal spray Place 2 sprays into both nostrils as needed for allergies or rhinitis.   Yes [provider]  hydrALAZINE  (APRESOLINE ) 100 MG tablet Take 1 tablet (100 mg total) by mouth every 8 (eight) hours. 04/18/21  Yes Jillian Buttery, MD  ibuprofen  (ADVIL ) 200 MG tablet Take 200 mg by mouth every 6 (six) hours as needed for moderate pain (pain score 4-6).   Yes [provider]  isosorbide  mononitrate (IMDUR ) 60 MG 24 hr tablet Take 1 tablet (60 mg total) by mouth daily. 04/24/23 07/26/24 Yes Milford, Harlene HERO, FNP  KLOR-CON  M20 20 MEQ tablet TAKE 2 TABLETS BY MOUTH 2 TIMES DAILY. 07/31/23  Yes Bensimhon, Toribio SAUNDERS, MD  metolazone  (ZAROXOLYN ) 5 MG tablet Take 5 mg by mouth as directed. Only take when directed by doctor, will take 40 meq of potassium with each dose   Yes [provider]  Misc Natural Products (PUMPKIN SEED OIL) CAPS Take 2 capsules by mouth in the morning.   Yes [provider]  tamsulosin  (FLOMAX ) 0.4 MG CAPS capsule Take 1 capsule (0.4 mg total) by mouth daily. 02/27/23  Yes Jimmy Charlie FERNS, MD  torsemide  (DEMADEX ) 20 MG tablet TAKE 3 TABLETS TWICE A DAY 09/03/23  Yes Bensimhon, Toribio SAUNDERS, MD  benzonatate  (TESSALON ) 200 MG capsule Take 1 capsule (200 mg total) by mouth 2 (two) times daily as needed for cough. Patient not taking: Reported on 08/07/2023 04/25/23   Blair, Diane W, FNP                                                                                                                                    Allergies Other  Review of Systems Review of Systems  HENT:  Negative for tinnitus.   Respiratory:  Negative for shortness of breath.   Cardiovascular:  Negative for chest pain and palpitations.  Gastrointestinal:  Positive for nausea and vomiting.  Neurological:  Positive for dizziness. Negative for headaches.   As noted in HPI  Physical Exam Vital  Signs  I have reviewed the triage vital signs BP (!) 179/75   Pulse 60   Temp 98.5 F (36.9 C) (Oral)   Resp 15   Ht 5' 9 (1.753 m)   Wt (!) 143 kg   SpO2 100%   BMI 46.56 kg/m   Physical Exam Vitals reviewed.  Constitutional:      General: He is not in acute distress.    Appearance: He is well-developed. He is not diaphoretic.  HENT:     Head: Normocephalic and atraumatic.     Nose: Nose normal.  Eyes:     General: No scleral icterus.       Right eye: No discharge.        Left eye: No discharge.     Conjunctiva/sclera: Conjunctivae normal.     Pupils: Pupils are equal, round, and reactive to light.  Cardiovascular:     Rate and Rhythm: Normal rate and regular rhythm.     Heart sounds: No murmur heard.    No friction rub. No gallop.  Pulmonary:     Effort: Pulmonary effort is normal. No respiratory distress.     Breath sounds: Normal breath sounds. No stridor. No rales.  Abdominal:     General: There is no distension.     Palpations: Abdomen is soft.     Tenderness: There is no abdominal tenderness.  Musculoskeletal:        General: No tenderness.     Cervical back: Normal range of motion and neck supple.  Skin:    General: Skin is warm and dry.     Findings: No erythema or rash.  Neurological:     Mental Status: He is alert and oriented to person, place, and time.     Comments: Mental Status:  Alert and oriented to person, place, and time.  Attention and concentration normal.  Speech clear.  Recent memory is intact  Cranial Nerves:  II Visual Fields: Intact to confrontation. Visual fields intact. III, IV, VI: Pupils equal and reactive to light and near. Full eye movement without nystagmus  V Facial Sensation: Normal. No weakness of masticatory muscles  VII: No facial weakness or asymmetry  VIII Auditory Acuity: Grossly normal  IX/X: The uvula is midline; the palate elevates symmetrically  XI: Normal sternocleidomastoid and trapezius strength  XII: The  tongue is midline. No atrophy or fasciculations.   Motor System: Muscle Strength: 5/5 and symmetric in the upper and lower extremities. No pronation or drift.  Muscle Tone: Tone and muscle bulk are normal in the upper and lower extremities.  Reflexes: No Clonus Coordination: Intact finger-to-nose, heel-to-shin. No tremor.  Sensation: Intact to light touch Gait: ataxic with wide gait      ED Results and Treatments Labs (all labs ordered are listed, but only abnormal results are displayed) Labs Reviewed  BASIC METABOLIC PANEL WITH GFR - Abnormal; Notable for the following components:      Result Value   BUN 66 (*)    Creatinine, Ser 3.73 (*)    GFR, Estimated 18 (*)    All other components within normal limits  CBC - Abnormal; Notable for the following components:   WBC 2.6 (*)    RBC 3.59 (*)    Hemoglobin 10.5 (*)    HCT 34.4 (*)    RDW 15.7 (*)    Platelets 109 (*)    All other components within normal limits  PROTIME-INR - Abnormal; Notable for the following components:   Prothrombin Time 16.3 (*)    All other components within normal limits  APTT - Abnormal; Notable for the following components:   aPTT 38 (*)    All other components within normal limits  TROPONIN I (HIGH SENSITIVITY) - Abnormal; Notable for the following components:   Troponin I (High Sensitivity) 56 (*)  All other components within normal limits  TROPONIN I (HIGH SENSITIVITY) - Abnormal; Notable for the following components:   Troponin I (High Sensitivity) 57 (*)    All other components within normal limits  ETHANOL                                                                                                                         EKG  EKG Interpretation Date/Time:  Saturday September 26 2023 23:50:32 EDT Ventricular Rate:  56 PR Interval:  227 QRS Duration:  119 QT Interval:  543 QTC Calculation: 525 R Axis:   -10  Text Interpretation: Sinus rhythm Prolonged PR interval Nonspecific  intraventricular conduction delay Low voltage, precordial leads Nonspecific T abnormalities, lateral leads Confirmed by Trine Likes [id number]) on 09/27/2023 2:22:45 AM       Radiology CT HEAD WO CONTRAST Result Date: 09/27/2023 CLINICAL DATA:  Transient ischemic attack. EXAM: CT HEAD WITHOUT CONTRAST TECHNIQUE: Contiguous axial images were obtained from the base of the skull through the vertex without intravenous contrast. RADIATION DOSE REDUCTION: This exam was performed according to the departmental dose-optimization program which includes automated exposure control, adjustment of the mA and/or kV according to patient size and/or use of iterative reconstruction technique. COMPARISON:  09/17/2017 FINDINGS: Brain: No evidence of acute infarction, hemorrhage, hydrocephalus, extra-axial collection or mass lesion/mass effect. Small foci of low attenuation within bilateral basal ganglia compatible with remote lacunar infarcts. Vascular: No hyperdense vessel or unexpected calcification. Skull: Normal. Negative for fracture or focal lesion. Sinuses/Orbits: No acute abnormality. Other: None. IMPRESSION: 1. No acute intracranial abnormalities. 2. Remote lacunar infarcts within bilateral basal ganglia. Electronically Signed   By: Waddell Calk M.D.   On: 09/27/2023 04:12    Medications Ordered in ED Medications  diazepam  (VALIUM ) injection 5 mg (5 mg Intravenous Given 09/27/23 9365)   Procedures Procedures  (including critical care time) Medical Decision Making / ED Course   Medical Decision Making Amount and/or Complexity of Data Reviewed Labs: ordered. Decision-making details documented in ED Course. Radiology: ordered and independent interpretation performed. Decision-making details documented in ED Course. ECG/medicine tests: ordered and independent interpretation performed. Decision-making details documented in ED Course.  Risk Prescription drug management.    Ataxia Noted to be hypertensive.   CT head to rule out ICH was negative for acute process. Remote lacunar infarct noted. Improved renal function on labs No electrolyte derangements noted EKG w/o dysrhythmias.   BP improve w/o intervention. Patient still feels off balance on reassessment. Will get repeat MRI to rule out new stroke.  Patient care turned over to oncoming provider. Patient case and results discussed in detail; please see their note for further ED managment.         Final Clinical Impression(s) / ED Diagnoses Final diagnoses:  None    This chart was dictated using voice recognition software.  Despite best efforts to proofread,  errors can occur which can change the documentation meaning.    Trine Likes Moder, MD  09/27/23 0738  

## 2023-09-27 NOTE — ED Notes (Signed)
 Placed on 2L NC

## 2023-10-05 ENCOUNTER — Inpatient Hospital Stay: Attending: Oncology

## 2023-10-05 DIAGNOSIS — D631 Anemia in chronic kidney disease: Secondary | ICD-10-CM | POA: Insufficient documentation

## 2023-10-05 DIAGNOSIS — Z79899 Other long term (current) drug therapy: Secondary | ICD-10-CM | POA: Diagnosis not present

## 2023-10-05 DIAGNOSIS — Z800 Family history of malignant neoplasm of digestive organs: Secondary | ICD-10-CM | POA: Diagnosis not present

## 2023-10-05 DIAGNOSIS — Z8249 Family history of ischemic heart disease and other diseases of the circulatory system: Secondary | ICD-10-CM | POA: Insufficient documentation

## 2023-10-05 DIAGNOSIS — F17210 Nicotine dependence, cigarettes, uncomplicated: Secondary | ICD-10-CM | POA: Insufficient documentation

## 2023-10-05 DIAGNOSIS — E538 Deficiency of other specified B group vitamins: Secondary | ICD-10-CM | POA: Diagnosis not present

## 2023-10-05 DIAGNOSIS — Z833 Family history of diabetes mellitus: Secondary | ICD-10-CM | POA: Insufficient documentation

## 2023-10-05 DIAGNOSIS — N184 Chronic kidney disease, stage 4 (severe): Secondary | ICD-10-CM | POA: Diagnosis not present

## 2023-10-05 MED ORDER — CYANOCOBALAMIN 1000 MCG/ML IJ SOLN
1000.0000 ug | Freq: Once | INTRAMUSCULAR | Status: AC
  Administered 2023-10-05: 1000 ug via INTRAMUSCULAR
  Filled 2023-10-05: qty 1

## 2023-10-08 ENCOUNTER — Ambulatory Visit (HOSPITAL_COMMUNITY)
Admission: RE | Admit: 2023-10-08 | Discharge: 2023-10-08 | Disposition: A | Discharge status: Home / Self Care | Payer: Self-pay | Source: Ambulatory Visit | Attending: Physician Assistant | Admitting: Physician Assistant

## 2023-10-08 ENCOUNTER — Encounter (HOSPITAL_COMMUNITY): Payer: Self-pay | Admitting: Physician Assistant

## 2023-10-08 VITALS — BP 150/60 | HR 58 | Ht 69.0 in | Wt 305.4 lb

## 2023-10-08 DIAGNOSIS — D6869 Other thrombophilia: Secondary | ICD-10-CM | POA: Diagnosis not present

## 2023-10-08 DIAGNOSIS — Z5181 Encounter for therapeutic drug level monitoring: Secondary | ICD-10-CM

## 2023-10-08 DIAGNOSIS — I4819 Other persistent atrial fibrillation: Secondary | ICD-10-CM

## 2023-10-08 DIAGNOSIS — Z79899 Other long term (current) drug therapy: Secondary | ICD-10-CM

## 2023-10-08 MED ORDER — AMIODARONE HCL 200 MG PO TABS: 100.0000 mg | ORAL_TABLET | Freq: Every day | ORAL | Status: DC

## 2023-10-08 NOTE — Progress Notes (Signed)
 Primary Care Physician: Jimmy Charlie FERNS, MD Primary Cardiologist: Toribio Fuel, MD Electrophysiologist: Eulas FORBES Furbish, MD  Referring Physician: Dr Bensimhon    Phillip [name] is a 63 y.o. male with a history of CHFrEF, COPD, HTN, CKD, OSA, morbid obesity, atrial flutter, atrial fibrillation who presents for follow up in the [facility]. He underwent ablation of atrial flutter by Dr. Kelsie in May 2019. He has had persistent atrial fibrillation. In Feb 2021, cardioversion was attempted but failed. He was maintained on amiodarone  for a while but had recurrence and underwent an ablation by Dr. Viola at [facility] in July, 2023. In follow-up in our AF clinic the following month, he was back in AF. The ablation was complicated by a large hematoma. He was seen by Dr Furbish 03/31/22, no changes at that time. Patient is on Eliquis  for stroke prevention.   Patient returns for follow up for atrial fibrillation and amiodarone  monitoring. He remains in SR today and feels well. He did require DCCV on 05/11/23. He feels like he is at his dry weight, no lower extremity edema. No bleeding issues on anticoagulation.   Today, he  denies symptoms of palpitations, chest pain, shortness of breath, orthopnea, PND, dizziness, presyncope, syncope, bleeding, or neurologic sequela. The patient is tolerating medications without difficulties and is otherwise without complaint today.    Atrial Fibrillation Risk Factors:  he does have symptoms or diagnosis of sleep apnea. he does not have a history of rheumatic fever.   Atrial Fibrillation Management history:  Previous antiarrhythmic drugs: amiodarone   Previous cardioversions: 10/14/18, 04/22/19, 05/10/19, 11/23/20, 04/16/21, 05/11/23 Previous ablations: 07/22/17 (flutter), 09/2021 [facility])  Anticoagulation history: Xarelto , Eliquis   ROS- All systems are reviewed and negative except as per the HPI above.   Physical Exam: BP (!)  150/60   Pulse (!) 58   Ht 5' 9 (1.753 m)   Wt (!) 138.5 kg   BMI 45.10 kg/m   GEN: Well nourished, well developed in no acute distress NECK: No JVD CARDIAC: Regular rate and rhythm, no murmurs, rubs, gallops RESPIRATORY:  Clear to auscultation without rales, wheezing or rhonchi  ABDOMEN: Soft, non-tender, non-distended EXTREMITIES:  No edema; No deformity    Wt Readings from Last 3 Encounters:  10/08/23 (!) 138.5 kg  09/26/23 (!) 143 kg  08/07/23 (!) 142.5 kg     EKG today demonstrates  SB, inc LBBB Vent. rate 58 BPM PR interval 194 ms QRS duration 112 ms QT/QTcB 512/502 ms   Echo 06/26/23 demonstrated   1. Endsystolic left ventricular volume index is 83 ml/m sq. Left  ventricular ejection fraction, by estimation, is 40 to 45%. The left  ventricle has mildly decreased function. The left ventricle demonstrates  global hypokinesis. The left ventricular internal cavity size was severely dilated. There is mild eccentric left ventricular hypertrophy. Left ventricular diastolic parameters are consistent with Grade II diastolic dysfunction (pseudonormalization). Elevated left atrial pressure.   2. Right ventricular systolic function is normal. The right ventricular  size is normal. Tricuspid regurgitation signal is inadequate for assessing  PA pressure.   3. Left atrial size was severely dilated.   4. Right atrial size was mildly dilated.   5. The mitral valve is normal in structure. Mild mitral valve  regurgitation. No evidence of mitral stenosis.   6. The aortic valve was not well visualized. Aortic valve regurgitation  is moderate to severe. No aortic stenosis is present. Aortic regurgitation  PHT measures 307 msec.  7. The inferior vena cava is normal in size with greater than 50%  respiratory variability, suggesting right atrial pressure of 3 mmHg.   Comparison(s): A prior study was performed on 04/13/2023. No significant  change from prior study. Prior images reviewed  side by side.    CHA2DS2-VASc Score = 4  The patient's score is based upon: CHF History: 1 HTN History: 1 Diabetes History: 0 Stroke History: 2 (remote infacrts on head CT) Vascular Disease History: 0 Age Score: 0 Gender Score: 0       ASSESSMENT AND PLAN: Persistent Atrial Fibrillation (ICD10:  I48.19) The patient's CHA2DS2-VASc score is 4, indicating a 4.8% annual risk of stroke.   Patient appears to be maintaining SR Continue amiodarone  100 mg daily (patient self decreased) Continue Eliquis  5 mg BID Continue carvedilol  12.5 mg BID  Secondary Hypercoagulable State (ICD10:  D68.69) The patient is at significant risk for stroke/thromboembolism based upon his CHA2DS2-VASc Score of 4.  Continue Apixaban  (Eliquis ). No bleeding issues.  High Risk Medication Monitoring (ICD 10: Z79.899) Intervals on ECG acceptable for amiodarone  monitoring. Check cmet/TSH today.     Obesity Body mass index is 45.1 kg/m.  Encouraged lifestyle modification Patient has upcoming visit with pharmacy to discuss starting GLP1  OSA  Encouraged nightly CPAP  Chronic HFrEF EF 40-45% GDMT per [facility] team Fluid status appears stable today  HTN Stable on current regimen   Follow up in the AF clinic in 6 months.    [name] PA-C [facility] [address] [place], [place] [address] [phone]

## 2023-10-09 ENCOUNTER — Ambulatory Visit (HOSPITAL_COMMUNITY): Payer: Self-pay | Admitting: Physician Assistant

## 2023-10-09 LAB — COMPREHENSIVE METABOLIC PANEL WITH GFR
ALT: 12 IU/L (ref 0–44)
AST: 17 IU/L (ref 0–40)
Albumin: 4.1 g/dL (ref 3.9–4.9)
Alkaline Phosphatase: 97 IU/L (ref 44–121)
BUN/Creatinine Ratio: 13 (ref 10–24)
BUN: 44 mg/dL — ABNORMAL HIGH (ref 8–27)
Bilirubin Total: 0.7 mg/dL (ref 0.0–1.2)
CO2: 23 mmol/L (ref 20–29)
Calcium: 9.3 mg/dL (ref 8.6–10.2)
Chloride: 102 mmol/L (ref 96–106)
Creatinine, Ser: 3.51 mg/dL — ABNORMAL HIGH (ref 0.76–1.27)
Globulin, Total: 2.3 g/dL (ref 1.5–4.5)
Glucose: 82 mg/dL (ref 70–99)
Potassium: 4.3 mmol/L (ref 3.5–5.2)
Sodium: 142 mmol/L (ref 134–144)
Total Protein: 6.4 g/dL (ref 6.0–8.5)
eGFR: 19 mL/min/1.73 — ABNORMAL LOW (ref >59)

## 2023-10-09 LAB — TSH: TSH: 2.44 u[IU]/mL (ref 0.450–4.500)

## 2023-10-24 ENCOUNTER — Other Ambulatory Visit (HOSPITAL_COMMUNITY): Payer: Self-pay | Admitting: Family Medicine

## 2023-11-06 ENCOUNTER — Ambulatory Visit

## 2023-11-06 ENCOUNTER — Other Ambulatory Visit

## 2023-11-06 ENCOUNTER — Ambulatory Visit: Admitting: Oncology

## 2023-11-09 ENCOUNTER — Encounter: Payer: Self-pay | Admitting: Oncology

## 2023-11-09 ENCOUNTER — Ambulatory Visit

## 2023-11-09 ENCOUNTER — Ambulatory Visit: Admitting: Oncology

## 2023-11-09 ENCOUNTER — Inpatient Hospital Stay: Attending: Oncology

## 2023-11-09 ENCOUNTER — Inpatient Hospital Stay: Admitting: Oncology

## 2023-11-09 ENCOUNTER — Inpatient Hospital Stay

## 2023-11-09 VITALS — BP 120/50 | HR 52 | Temp 97.4°F | Resp 18 | Wt 304.0 lb

## 2023-11-09 DIAGNOSIS — Z8249 Family history of ischemic heart disease and other diseases of the circulatory system: Secondary | ICD-10-CM | POA: Diagnosis not present

## 2023-11-09 DIAGNOSIS — R634 Abnormal weight loss: Secondary | ICD-10-CM | POA: Diagnosis not present

## 2023-11-09 DIAGNOSIS — E538 Deficiency of other specified B group vitamins: Secondary | ICD-10-CM

## 2023-11-09 DIAGNOSIS — Z86718 Personal history of other venous thrombosis and embolism: Secondary | ICD-10-CM | POA: Insufficient documentation

## 2023-11-09 DIAGNOSIS — Z800 Family history of malignant neoplasm of digestive organs: Secondary | ICD-10-CM | POA: Diagnosis not present

## 2023-11-09 DIAGNOSIS — I509 Heart failure, unspecified: Secondary | ICD-10-CM | POA: Diagnosis not present

## 2023-11-09 DIAGNOSIS — Z833 Family history of diabetes mellitus: Secondary | ICD-10-CM | POA: Diagnosis not present

## 2023-11-09 DIAGNOSIS — N184 Chronic kidney disease, stage 4 (severe): Secondary | ICD-10-CM

## 2023-11-09 DIAGNOSIS — D631 Anemia in chronic kidney disease: Secondary | ICD-10-CM | POA: Insufficient documentation

## 2023-11-09 DIAGNOSIS — I480 Paroxysmal atrial fibrillation: Secondary | ICD-10-CM | POA: Diagnosis not present

## 2023-11-09 DIAGNOSIS — Z87891 Personal history of nicotine dependence: Secondary | ICD-10-CM | POA: Diagnosis not present

## 2023-11-09 DIAGNOSIS — I130 Hypertensive heart and chronic kidney disease with heart failure and stage 1 through stage 4 chronic kidney disease, or unspecified chronic kidney disease: Secondary | ICD-10-CM | POA: Insufficient documentation

## 2023-11-09 DIAGNOSIS — Z86711 Personal history of pulmonary embolism: Secondary | ICD-10-CM | POA: Diagnosis not present

## 2023-11-09 DIAGNOSIS — Z79899 Other long term (current) drug therapy: Secondary | ICD-10-CM | POA: Diagnosis not present

## 2023-11-09 DIAGNOSIS — G4733 Obstructive sleep apnea (adult) (pediatric): Secondary | ICD-10-CM | POA: Diagnosis not present

## 2023-11-09 DIAGNOSIS — Z7901 Long term (current) use of anticoagulants: Secondary | ICD-10-CM | POA: Insufficient documentation

## 2023-11-09 DIAGNOSIS — Z832 Family history of diseases of the blood and blood-forming organs and certain disorders involving the immune mechanism: Secondary | ICD-10-CM | POA: Diagnosis not present

## 2023-11-09 LAB — RETIC PANEL
Immature Retic Fract: 10.8 % (ref 2.3–15.9)
RBC.: 3.86 MIL/uL — ABNORMAL LOW (ref 4.22–5.81)
Retic Count, Absolute: 45.9 K/uL (ref 19.0–186.0)
Retic Ct Pct: 1.2 % (ref 0.4–3.1)
Reticulocyte Hemoglobin: 29.2 pg (ref >27.9)

## 2023-11-09 LAB — IRON AND TIBC
Iron: 117 ug/dL (ref 45–182)
Saturation Ratios: 45 % — ABNORMAL HIGH (ref 17.9–39.5)
TIBC: 262 ug/dL (ref 250–450)
UIBC: 145 ug/dL

## 2023-11-09 LAB — FERRITIN: Ferritin: 295 ng/mL (ref 24–336)

## 2023-11-09 LAB — VITAMIN B12: Vitamin B-12: 696 pg/mL (ref 180–914)

## 2023-11-09 MED ORDER — CYANOCOBALAMIN 1000 MCG/ML IJ SOLN
1000.0000 ug | Freq: Once | INTRAMUSCULAR | Status: AC
  Administered 2023-11-09: 1000 ug via INTRAMUSCULAR
  Filled 2023-11-09: qty 1

## 2023-11-09 NOTE — Assessment & Plan Note (Signed)
 B12 level has improved.  He received 1 empiric dose of vitamin B12 injection today.  I will hold off additional B12 injections.

## 2023-11-09 NOTE — Assessment & Plan Note (Addendum)
 Recommend patient to make follow up appointment with nephrology

## 2023-11-09 NOTE — Assessment & Plan Note (Addendum)
 Anemia, likely secondary to chronic kidney disease Labs reviewed and discussed with patient. Lab Results  Component Value Date   HGB 11.2 (L) 11/09/2023   TIBC 262 11/09/2023   IRONPCTSAT 45 (H) 11/09/2023   FERRITIN 295 11/09/2023   Hold off IV Venofer .

## 2023-11-09 NOTE — Progress Notes (Signed)
 Hematology/Oncology Progress note [phone] [phone]  CHIEF COMPLAINTS/REASON FOR VISIT:  anemia of chronic kidney disease, B12 deficiency.  ASSESSMENT & PLAN:   Anemia of chronic renal failure Anemia, likely secondary to chronic kidney disease Labs reviewed and discussed with patient. Lab Results  Component Value Date   HGB 11.2 (L) 11/09/2023   TIBC 262 11/09/2023   IRONPCTSAT 45 (H) 11/09/2023   FERRITIN 295 11/09/2023   Hold off IV Venofer .      B12 deficiency B12 level has improved.  He received 1 empiric dose of vitamin B12 injection today.  I will hold off additional B12 injections.    Chronic kidney disease, stage IV (severe) (HCC) Recommend patient to make follow up appointment with nephrology  Orders Placed This Encounter  Procedures   CBC with Differential (Cancer Center Only)    Standing Status:   Future    Expected Date:   03/10/2024    Expiration Date:   06/08/2024   Iron  and TIBC    Standing Status:   Future    Expected Date:   03/10/2024    Expiration Date:   06/08/2024   Ferritin    Standing Status:   Future    Expected Date:   03/10/2024    Expiration Date:   06/08/2024   Retic Panel    Standing Status:   Future    Expected Date:   03/10/2024    Expiration Date:   06/08/2024   Vitamin B12    Standing Status:   Future    Expected Date:   03/10/2024    Expiration Date:   06/08/2024   Follow-up 6 months. All questions were answered. The patient knows to call the clinic with any problems, questions or concerns.  Zelphia Cap, MD, PhD [facility] Hematology Oncology 11/09/2023   HISTORY OF PRESENTING ILLNESS:   Phillip [name] is a  63 y.o.  male with PMH listed below was seen in consultation at the request of  Jimmy Ade I, MD  for evaluation of anemia of chronic kidney disease.  Patient has chronic kidney disease and follows up with Dr. Dennise.  Baseline creatinine 2.9. History of pulmonary embolism [2010], left lower  extremity DVT[2017], atrial fibrillation, currently on Xarelto  20 mg daily.  Managed by cardiology/CHF clinic Dr.Bensimhon.  Patient denies any melena, hematochezia.  07/16/2021 CBC showed hemoglobin 10.4, MCV 91, platelet count 10 9000, white count 2.6. Patient denies alcohol use.  He has intentionally lost weight. Patient was referred to establish care with hematology for further evaluation.  INTERVAL HISTORY Phillip [name] is a 63 y.o. male who has above history reviewed by me today presents for follow up visit for management of anemia due to chronic kidney disease, vitamin B12 deficiency. Patient reports feeling well. He is on Eliquis  5mg  BID for A-fib.  Follows up with [facility] cardiology. Chronic kidney disease.  He has not seen nephrology since last visit in 2023 No other new complaints.    Review of Systems  Constitutional:  Negative for appetite change, chills, fatigue, fever and unexpected weight change.  HENT:   Negative for hearing loss and voice change.   Eyes:  Negative for eye problems and icterus.  Respiratory:  Negative for chest tightness, cough and shortness of breath.   Cardiovascular:  Negative for chest pain and leg swelling.  Gastrointestinal:  Negative for abdominal distention and abdominal pain.  Endocrine: Negative for hot flashes.  Genitourinary:  Negative for difficulty urinating, dysuria and frequency.   Musculoskeletal:  Negative for arthralgias.  Skin:  Negative for itching and rash.  Neurological:  Negative for light-headedness and numbness.  Hematological:  Negative for adenopathy. Does not bruise/bleed easily.  Psychiatric/Behavioral:  Negative for confusion.     MEDICAL HISTORY:  Past Medical History:  Diagnosis Date   Asthma    CHF (congestive heart failure) (HCC) 11/15   EF 40-45%, improved with follow-up   Chronic kidney disease    COPD (chronic obstructive pulmonary disease) (HCC)    Deep vein thrombosis (DVT) of lower extremity (HCC) 08/17/2015   DVT  (deep venous thrombosis) (HCC)    Gout    Hypertension    Hypertensive cardiovascular disease    Kidney dysfunction    Morbid obesity (HCC)    OSA on CPAP    Paroxysmal atrial fibrillation (HCC)    PNA (pneumonia)    Pulmonary embolism (HCC) 2010   left leg DVT   Sleep apnea    uses cpap    SURGICAL HISTORY: Past Surgical History:  Procedure Laterality Date   A-FLUTTER ABLATION N/A 07/22/2017   Procedure: A-FLUTTER ABLATION;  Surgeon: Kelsie Agent, MD;  Location: [facility] INVASIVE CV LAB;  Service: Cardiovascular;  Laterality: N/A;   CARDIOVERSION N/A 10/14/2018   Procedure: CARDIOVERSION;  Surgeon: Cherrie Toribio SAUNDERS, MD;  Location: [facility] ENDOSCOPY;  Service: Cardiovascular;  Laterality: N/A;   CARDIOVERSION N/A 04/22/2019   Procedure: CARDIOVERSION;  Surgeon: Alveta Aleene PARAS, MD;  Location: [facility] ENDOSCOPY;  Service: Cardiovascular;  Laterality: N/A;   CARDIOVERSION N/A 05/10/2019   Procedure: CARDIOVERSION;  Surgeon: Cherrie Toribio SAUNDERS, MD;  Location: [facility] ENDOSCOPY;  Service: Cardiovascular;  Laterality: N/A;   CARDIOVERSION N/A 11/23/2020   Procedure: CARDIOVERSION;  Surgeon: Mona Vinie BROCKS, MD;  Location: [facility] ENDOSCOPY;  Service: Cardiovascular;  Laterality: N/A;   CARDIOVERSION N/A 04/16/2021   Procedure: CARDIOVERSION;  Surgeon: Sheena Pugh, DO;  Location: [facility] ENDOSCOPY;  Service: Cardiovascular;  Laterality: N/A;   CARDIOVERSION N/A 05/11/2023   Procedure: CARDIOVERSION;  Surgeon: Cherrie Toribio SAUNDERS, MD;  Location: [facility] INVASIVE CV LAB;  Service: Cardiovascular;  Laterality: N/A;   COLONOSCOPY WITH PROPOFOL  N/A 12/20/2019   Procedure: COLONOSCOPY WITH PROPOFOL ;  Surgeon: Leigh Elspeth SQUIBB, MD;  Location: [facility] ENDOSCOPY;  Service: Gastroenterology;  Laterality: N/A;   POLYPECTOMY  12/20/2019   Procedure: POLYPECTOMY;  Surgeon: Leigh Elspeth SQUIBB, MD;  Location: [facility] ENDOSCOPY;  Service: Gastroenterology;;   TEE WITHOUT CARDIOVERSION N/A 03/05/2022   Procedure: TRANSESOPHAGEAL ECHOCARDIOGRAM  (TEE);  Surgeon: Alvan Ronal BRAVO, MD;  Location: [facility] ENDOSCOPY;  Service: Cardiovascular;  Laterality: N/A;   UMBILICAL HERNIA REPAIR  1992    SOCIAL HISTORY: Social History   Socioeconomic History   Marital status: Divorced    Spouse name: Not on file   Number of children: 2   Years of education: 12   Highest education level: Not on file  Occupational History   Occupation: Drives dump truck    Comment:     Occupation: IT sales professional    Comment: Disabled  Tobacco Use   Smoking status: Former    Current packs/day: 0.00    Average packs/day: 0.3 packs/day for 1 year (0.3 ttl pk-yrs)    Types: Cigarettes    Start date: 07/23/1988    Quit date: 07/23/1989    Years since quitting: 34.3   Smokeless tobacco: Never   Tobacco comments:    Former smoker 03/06/2021  Vaping Use   Vaping status: Never Used  Substance and Sexual Activity   Alcohol use: No  Alcohol/week: 0.0 standard drinks of alcohol   Drug use: No   Sexual activity: Not Currently  Other Topics Concern   Not on file  Social History Narrative   Pt lives in [place], alone.   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   Has 2 sons in [place]       No living will   Would want sons to make decisions for him   Would accept resuscitation   Social Drivers of Health   Financial Resource Strain: Low Risk  (01/19/2018)   Overall Financial Resource Strain (CARDIA)    Difficulty of Paying Living Expenses: Not hard at all  Food Insecurity: No Food Insecurity (04/13/2023)   Hunger Vital Sign    Worried About Running Out of Food in the Last Year: Never true    Ran Out of Food in the Last Year: Never true  Transportation Needs: No Transportation Needs (04/13/2023)   PRAPARE - Administrator, Civil Service (Medical): No    Lack of Transportation (Non-Medical): No  Physical Activity: Not on file  Stress: Not on file  Social Connections: Not on file  Intimate Partner Violence: Not At Risk  (04/13/2023)   Humiliation, Afraid, Rape, and Kick questionnaire    Fear of Current or Ex-Partner: No    Emotionally Abused: No    Physically Abused: No    Sexually Abused: No    FAMILY HISTORY: Family History  Problem Relation Age of Onset   Hypertension Mother    Diabetes Father    CAD Father    Clotting disorder Father    Heart disease Father        transplant   Stomach cancer Father    Allergies Brother    Colon cancer Neg Hx    Colon polyps Neg Hx    Esophageal cancer Neg Hx    Rectal cancer Neg Hx     ALLERGIES:  is allergic to other.  MEDICATIONS:  Current Outpatient Medications  Medication Sig Dispense Refill   albuterol  (PROAIR  HFA) 108 (90 Base) MCG/ACT inhaler Inhale 2 puffs into the lungs every 4 (four) hours as needed for wheezing or shortness of breath. 1 Inhaler 3   allopurinol  (ZYLOPRIM ) 300 MG tablet TAKE 1 TABLET BY MOUTH EVERY DAY 90 tablet 3   amiodarone  (PACERONE ) 200 MG tablet Take 0.5 tablets (100 mg total) by mouth daily.     apixaban  (ELIQUIS ) 5 MG TABS tablet TAKE 1 TABLET BY MOUTH TWICE A DAY 60 tablet 6   benzonatate  (TESSALON ) 200 MG capsule Take 1 capsule (200 mg total) by mouth 2 (two) times daily as needed for cough. 20 capsule 0   budesonide -formoterol  (SYMBICORT ) 80-4.5 MCG/ACT inhaler Take 2 puffs first thing in am and then another 2 puffs about 12 hours later. 30.6 g 3   calcitRIOL (ROCALTROL) 0.25 MCG capsule Take 0.25 mcg by mouth.     Calcium  Carb-Cholecalciferol  (CALCIUM  600 + D PO) Take 1 tablet by mouth every 3 (three) days.     carvedilol  (COREG ) 6.25 MG tablet TAKE 2 TABLETS (12.5 MG TOTAL) BY MOUTH 2 (TWO) TIMES DAILY WITH A MEAL. 120 tablet 5   Cholecalciferol  (VITAMIN D3) 50 MCG (2000 UT) TABS Take 2,000 Units by mouth daily.     fluticasone  (FLONASE ) 50 MCG/ACT nasal spray Place 2 sprays into both nostrils as needed for allergies or rhinitis.     hydrALAZINE  (APRESOLINE ) 100 MG tablet Take 1 tablet (100 mg total)  by mouth every 8  (eight) hours. 90 tablet 0   ibuprofen  (ADVIL ) 200 MG tablet Take 200 mg by mouth every 6 (six) hours as needed for moderate pain (pain score 4-6).     isosorbide  mononitrate (IMDUR ) 60 MG 24 hr tablet Take 1 tablet (60 mg total) by mouth daily. 90 tablet 3   KLOR-CON  M20 20 MEQ tablet TAKE 2 TABLETS BY MOUTH 2 TIMES DAILY. 360 tablet 3   metolazone  (ZAROXOLYN ) 2.5 MG tablet Take 2.5 mg by mouth once a week.     Misc Natural Products (PUMPKIN SEED OIL) CAPS Take 2 capsules by mouth in the morning.     tamsulosin  (FLOMAX ) 0.4 MG CAPS capsule Take 1 capsule (0.4 mg total) by mouth daily. 90 capsule 3   torsemide  (DEMADEX ) 20 MG tablet TAKE 3 TABLETS TWICE A DAY 540 tablet 3   No current facility-administered medications for this visit.     PHYSICAL EXAMINATION: ECOG PERFORMANCE STATUS: 1 - Symptomatic but completely ambulatory Vitals:   11/09/23 1409  BP: (!) 120/50  Pulse: (!) 52  Resp: 18  Temp: (!) 97.4 F (36.3 C)  SpO2: 100%   Filed Weights   11/09/23 1409  Weight: (!) 304 lb (137.9 kg)    Physical Exam Constitutional:      General: He is not in acute distress.    Appearance: He is obese.  HENT:     Head: Normocephalic and atraumatic.  Eyes:     General: No scleral icterus. Cardiovascular:     Rate and Rhythm: Normal rate and regular rhythm.     Heart sounds: Normal heart sounds.  Pulmonary:     Effort: Pulmonary effort is normal. No respiratory distress.     Breath sounds: No wheezing.  Abdominal:     General: Bowel sounds are normal. There is no distension.     Palpations: Abdomen is soft.  Musculoskeletal:        General: No deformity. Normal range of motion.     Cervical back: Normal range of motion and neck supple.  Skin:    General: Skin is warm and dry.     Findings: No erythema or rash.  Neurological:     Mental Status: He is alert and oriented to person, place, and time. Mental status is at baseline.     Cranial Nerves: No cranial nerve deficit.   Psychiatric:        Mood and Affect: Mood normal.     LABORATORY DATA:  I have reviewed the data as listed    Latest Ref Rng & Units 11/09/2023   11:39 AM 09/26/2023   11:18 PM 08/07/2023    7:53 AM  CBC  WBC 4.0 - 10.5 K/uL 3.3  2.6  2.6   Hemoglobin 13.0 - 17.0 g/dL 88.7  89.4  89.2   Hematocrit 39.0 - 52.0 % 35.8  34.4  33.8   Platelets 150 - 400 K/uL PENDING  109  110     Recent Labs    02/27/23 0858 02/27/23 0858 04/02/23 0920 04/13/23 0727 04/14/23 0358 05/26/23 0935 07/27/23 1103 09/26/23 2318 10/08/23 1003  NA 144  --  142 142   < > 143 141 139 142  K 3.9  --  3.9 4.0   < > 3.8 3.7 3.9 4.3  CL 103  --  105 106   < > 98 99 104 102  CO2 37*  --  28 29   < > 32 34* 26 23  GLUCOSE 83  --  95 83   < > 96 73 97 82  BUN 52*  --  41* 45*   < > 63* 64* 66* 44*  CREATININE 3.06*  --  3.10* 2.92*   < > 4.36* 4.43* 3.73* 3.51*  CALCIUM  8.8  --  9.0 8.9   < > 9.6 9.3 9.0 9.3  GFRNONAA  --    < > 22* 24*   < > 15* 14* 18*  --   PROT 5.7*  --  6.2* 6.7  --   --   --   --  6.4  ALBUMIN  3.6  3.6  --  3.5 3.8  --   --   --   --  4.1  AST 19  --  19 27  --   --   --   --  17  ALT 16  --  17 24  --   --   --   --  12  ALKPHOS 85  --  85 92  --   --   --   --  97  BILITOT 0.9  --  0.9 0.9  --   --   --   --  0.7  BILIDIR 0.2  --   --  0.3*  --   --   --   --   --   IBILI  --   --   --  0.6  --   --   --   --   --    < > = values in this interval not displayed.   Iron /TIBC/Ferritin/ %Sat    Component Value Date/Time   IRON  117 11/09/2023 1139   IRON  74 03/23/2017 1133   TIBC 262 11/09/2023 1139   TIBC 334 03/23/2017 1133   FERRITIN 295 11/09/2023 1139   IRONPCTSAT 45 (H) 11/09/2023 1139   IRONPCTSAT 22 03/23/2017 1133      RADIOGRAPHIC STUDIES: I have personally reviewed the radiological images as listed and agreed with the findings in the report. No results found.

## 2023-11-10 LAB — CBC WITH DIFFERENTIAL (CANCER CENTER ONLY)
Abs Immature Granulocytes: 0.03 K/uL (ref 0.00–0.07)
Basophils Absolute: 0 K/uL (ref 0.0–0.1)
Basophils Relative: 1 %
Eosinophils Absolute: 0 K/uL (ref 0.0–0.5)
Eosinophils Relative: 1 %
HCT: 35.8 % — ABNORMAL LOW (ref 39.0–52.0)
Hemoglobin: 11.2 g/dL — ABNORMAL LOW (ref 13.0–17.0)
Immature Granulocytes: 1 %
Lymphocytes Relative: 23 %
Lymphs Abs: 0.8 K/uL (ref 0.7–4.0)
MCH: 29.2 pg (ref 26.0–34.0)
MCHC: 31.3 g/dL (ref 30.0–36.0)
MCV: 93.2 fL (ref 80.0–100.0)
Monocytes Absolute: 0.4 K/uL (ref 0.1–1.0)
Monocytes Relative: 13 %
Neutro Abs: 2 K/uL (ref 1.7–7.7)
Neutrophils Relative %: 61 %
Platelet Count: 114 K/uL — AB (ref 150–400)
RBC: 3.84 MIL/uL — ABNORMAL LOW (ref 4.22–5.81)
RDW: 15.7 % — ABNORMAL HIGH (ref 11.5–15.5)
WBC Count: 3.3 K/uL — ABNORMAL LOW (ref 4.0–10.5)
nRBC: 0 % (ref 0.0–0.2)

## 2023-11-13 ENCOUNTER — Ambulatory Visit: Attending: Internal Medicine | Admitting: Pharmacist

## 2023-11-13 NOTE — Progress Notes (Signed)
 Patient ID: Phillip [name]                 DOB: 02/12/1961                    MRN: [id number]     HPI: Phillip [name] is a 63 y.o. male patient of Dr. Fontaine referred to pharmacy clinic by Harlene Ganong to initiate GLP1-RA therapy. PMH is significant for PAF, remote PE, hx of DVT, OSA, CKD Stage III, HTN, and combined chronic systolic/diastolic, and obesity. Most recent BMI 45 kg/m .  Patient presents today to Pharm.D. clinic.  He has lost about 130 pounds over the last 2-1/2 years.  Has gained about 8 pounds in the last 2 months but overall has kept the weight off.  He lost weight by cutting out sugary drinks improving his portion control and exercise.  He states that he loves the challenge of improving his health and losing weight.  He hurt his knee back in March but is still exercising as it slowly heals.  We did discuss GLP-1 therapy and its benefits and downfalls.  Diet:  Breakfast: 3 chicken sausage w/ 2 egg Lunch: skips Dinner: tuna sandwich, baked chicken wings with vegetables Snacks: fruit Drink: water, sometimes cuts juice with water  Exercise: light weights, drives dump truck, walks on treadmill every other day for 30 min  Family History:  Family History  Problem Relation Age of Onset   Hypertension Mother    Diabetes Father    CAD Father    Clotting disorder Father    Heart disease Father        transplant   Stomach cancer Father    Allergies Brother    Colon cancer Neg Hx    Colon polyps Neg Hx    Esophageal cancer Neg Hx    Rectal cancer Neg Hx      Social History: no tobacco use, no ETOH  Labs: Lab Results  Component Value Date   HGBA1C 5.2 07/27/2023    Wt Readings from Last 1 Encounters:  11/09/23 (!) 304 lb (137.9 kg)    BP Readings from Last 1 Encounters:  11/09/23 (!) 120/50   Pulse Readings from Last 1 Encounters:  11/09/23 (!) 52       Component Value Date/Time   CHOL 152 05/15/2019 0403   TRIG 41 05/15/2019 0403   HDL 44 05/15/2019 0403    CHOLHDL 3.5 05/15/2019 0403   VLDL 8 05/15/2019 0403   LDLCALC 100 (H) 05/15/2019 0403    Past Medical History:  Diagnosis Date   Asthma    CHF (congestive heart failure) (HCC) 11/15   EF 40-45%, improved with follow-up   Chronic kidney disease    COPD (chronic obstructive pulmonary disease) (HCC)    Deep vein thrombosis (DVT) of lower extremity (HCC) 08/17/2015   DVT (deep venous thrombosis) (HCC)    Gout    Hypertension    Hypertensive cardiovascular disease    Kidney dysfunction    Morbid obesity (HCC)    OSA on CPAP    Paroxysmal atrial fibrillation (HCC)    PNA (pneumonia)    Pulmonary embolism (HCC) 2010   left leg DVT   Sleep apnea    uses cpap    Current Outpatient Medications on File Prior to Visit  Medication Sig Dispense Refill   albuterol  (PROAIR  HFA) 108 (90 Base) MCG/ACT inhaler Inhale 2 puffs into the lungs every 4 (four) hours as needed for wheezing or shortness  of breath. 1 Inhaler 3   allopurinol  (ZYLOPRIM ) 300 MG tablet TAKE 1 TABLET BY MOUTH EVERY DAY 90 tablet 3   amiodarone  (PACERONE ) 200 MG tablet Take 0.5 tablets (100 mg total) by mouth daily.     apixaban  (ELIQUIS ) 5 MG TABS tablet TAKE 1 TABLET BY MOUTH TWICE A DAY 60 tablet 6   benzonatate  (TESSALON ) 200 MG capsule Take 1 capsule (200 mg total) by mouth 2 (two) times daily as needed for cough. 20 capsule 0   budesonide -formoterol  (SYMBICORT ) 80-4.5 MCG/ACT inhaler Take 2 puffs first thing in am and then another 2 puffs about 12 hours later. 30.6 g 3   calcitRIOL (ROCALTROL) 0.25 MCG capsule Take 0.25 mcg by mouth.     Calcium  Carb-Cholecalciferol  (CALCIUM  600 + D PO) Take 1 tablet by mouth every 3 (three) days.     carvedilol  (COREG ) 6.25 MG tablet TAKE 2 TABLETS (12.5 MG TOTAL) BY MOUTH 2 (TWO) TIMES DAILY WITH A MEAL. 120 tablet 5   Cholecalciferol  (VITAMIN D3) 50 MCG (2000 UT) TABS Take 2,000 Units by mouth daily.     fluticasone  (FLONASE ) 50 MCG/ACT nasal spray Place 2 sprays into both nostrils  as needed for allergies or rhinitis.     hydrALAZINE  (APRESOLINE ) 100 MG tablet Take 1 tablet (100 mg total) by mouth every 8 (eight) hours. 90 tablet 0   ibuprofen  (ADVIL ) 200 MG tablet Take 200 mg by mouth every 6 (six) hours as needed for moderate pain (pain score 4-6).     isosorbide  mononitrate (IMDUR ) 60 MG 24 hr tablet Take 1 tablet (60 mg total) by mouth daily. 90 tablet 3   KLOR-CON  M20 20 MEQ tablet TAKE 2 TABLETS BY MOUTH 2 TIMES DAILY. 360 tablet 3   metolazone  (ZAROXOLYN ) 2.5 MG tablet Take 2.5 mg by mouth once a week.     Misc Natural Products (PUMPKIN SEED OIL) CAPS Take 2 capsules by mouth in the morning.     tamsulosin  (FLOMAX ) 0.4 MG CAPS capsule Take 1 capsule (0.4 mg total) by mouth daily. 90 capsule 3   torsemide  (DEMADEX ) 20 MG tablet TAKE 3 TABLETS TWICE A DAY 540 tablet 3   No current facility-administered medications on file prior to visit.    Allergies  Allergen Reactions   Other Anaphylaxis    mushrooms     Assessment/Plan:  1. Weight loss -patient has declined GLP-1 P therapy stating that he rather lose the weight on his own.  Enjoys the challenge.  Nutrition handouts provided to patient and congratulated him on his success thus far.   Keeya Dyckman D Samya Siciliano, Pharm.JONETTA SARAN, CPP [facility] [address], [place], [place] [address]  Phone: [phone]; Fax: [phone]

## 2023-12-04 ENCOUNTER — Ambulatory Visit: Admitting: Family Medicine

## 2023-12-04 ENCOUNTER — Encounter: Payer: Self-pay | Admitting: Family Medicine

## 2023-12-04 VITALS — BP 130/69 | HR 55 | Temp 98.3°F | Resp 18 | Ht 69.0 in | Wt 306.0 lb

## 2023-12-04 DIAGNOSIS — M1000 Idiopathic gout, unspecified site: Secondary | ICD-10-CM

## 2023-12-04 DIAGNOSIS — G4733 Obstructive sleep apnea (adult) (pediatric): Secondary | ICD-10-CM | POA: Diagnosis not present

## 2023-12-04 DIAGNOSIS — N5314 Retrograde ejaculation: Secondary | ICD-10-CM

## 2023-12-04 NOTE — Assessment & Plan Note (Signed)
 CPAP has stopped working.  Will get  him a new CPAP machine
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
## 2023-12-04 NOTE — Assessment & Plan Note (Signed)
 Caused by Tamsulosin .  Hold the Tamsulosin  and see if the problem resolves.  Will have to decide if benefit of Tamsulosin  is worth the change in sexual function

## 2023-12-04 NOTE — Assessment & Plan Note (Signed)
 Occurs in his feet.  Takes allopurinol  and has no problems

## 2023-12-09 ENCOUNTER — Telehealth: Payer: Self-pay

## 2023-12-09 NOTE — Telephone Encounter (Signed)
 Copied from CRM #[id number]. Topic: Clinical - Prescription Issue >> Dec 09, 2023 11:47 AM Ivette P wrote: Reason for CRM: PT called in to follow up on CPAP pt would like for the order to go to Clinic.    Pls follow up [phone]   [facility] [address] [place] [address] Phone: [phone]

## 2023-12-09 NOTE — Telephone Encounter (Signed)
 Order has been placed with [facility]. Patient has been notified.

## 2024-01-07 ENCOUNTER — Encounter (HOSPITAL_BASED_OUTPATIENT_CLINIC_OR_DEPARTMENT_OTHER): Payer: Self-pay | Admitting: Emergency Medicine

## 2024-01-07 ENCOUNTER — Emergency Department (HOSPITAL_BASED_OUTPATIENT_CLINIC_OR_DEPARTMENT_OTHER)
Admission: EM | Admit: 2024-01-07 | Discharge: 2024-01-07 | Disposition: A | Discharge status: Home / Self Care | Attending: Emergency Medicine | Admitting: Emergency Medicine

## 2024-01-07 ENCOUNTER — Other Ambulatory Visit: Payer: Self-pay

## 2024-01-07 DIAGNOSIS — J029 Acute pharyngitis, unspecified: Secondary | ICD-10-CM | POA: Insufficient documentation

## 2024-01-07 DIAGNOSIS — Z7901 Long term (current) use of anticoagulants: Secondary | ICD-10-CM | POA: Insufficient documentation

## 2024-01-07 DIAGNOSIS — I509 Heart failure, unspecified: Secondary | ICD-10-CM | POA: Insufficient documentation

## 2024-01-07 DIAGNOSIS — I4891 Unspecified atrial fibrillation: Secondary | ICD-10-CM | POA: Diagnosis not present

## 2024-01-07 LAB — GROUP A STREP BY PCR: Group A Strep by PCR: NOT DETECTED

## 2024-01-07 MED ORDER — PREDNISONE 10 MG PO TABS
20.0000 mg | ORAL_TABLET | Freq: Two times a day (BID) | ORAL | 0 refills | Status: DC
Start: 2024-01-07 — End: 2024-02-16

## 2024-01-07 MED ORDER — CEPHALEXIN 500 MG PO CAPS: 500.0000 mg | ORAL_CAPSULE | Freq: Four times a day (QID) | ORAL | 0 refills | Status: DC

## 2024-01-07 MED ORDER — PREDNISONE 20 MG PO TABS
20.0000 mg | ORAL_TABLET | Freq: Once | ORAL | Status: AC
  Administered 2024-01-07: 20 mg via ORAL
  Filled 2024-01-07: qty 1

## 2024-01-07 NOTE — ED Triage Notes (Signed)
 Patient reports sore throat for two days. Denies fevers/chills.

## 2024-01-07 NOTE — ED Provider Notes (Signed)
 [place] EMERGENCY DEPARTMENT AT [facility] Provider Note   CSN: [id number] Arrival date & time: 01/07/24  9782     Patient presents with: Sore Throat   Phillip [name] is a 63 y.o. male.   Patient is a 63 year old male with past medical history of congestive heart failure, atrial fibrillation, obstructive sleep apnea, hyperlipidemia.  Patient presenting today with complaints of sore throat.  This started 2 days ago after coming home from a concert in [place].  He describes pain to the back of his throat that is worse when he swallows.  He denies any fevers or chills.       Prior to Admission medications   Medication Sig Start Date End Date Taking? Authorizing Provider  albuterol  (PROAIR  HFA) 108 (90 Base) MCG/ACT inhaler Inhale 2 puffs into the lungs every 4 (four) hours as needed for wheezing or shortness of breath. 05/14/18   Darlean Ozell NOVAK, MD  allopurinol  (ZYLOPRIM ) 300 MG tablet TAKE 1 TABLET BY MOUTH EVERY DAY 06/18/23   Jimmy Charlie FERNS, MD  amiodarone  (PACERONE ) 200 MG tablet Take 0.5 tablets (100 mg total) by mouth daily. 10/08/23   Fenton, Clint R, PA  apixaban  (ELIQUIS ) 5 MG TABS tablet TAKE 1 TABLET BY MOUTH TWICE A DAY 09/21/23   Fenton, Clint R, PA  benzonatate  (TESSALON ) 200 MG capsule Take 1 capsule (200 mg total) by mouth 2 (two) times daily as needed for cough. 04/25/23   Blair, Diane W, FNP  budesonide -formoterol  (SYMBICORT ) 80-4.5 MCG/ACT inhaler Take 2 puffs first thing in am and then another 2 puffs about 12 hours later. 04/16/23   Wert, Michael B, MD  calcitRIOL (ROCALTROL) 0.25 MCG capsule Take 0.25 mcg by mouth. 10/21/23 10/20/24  [provider]  Calcium  Carb-Cholecalciferol  (CALCIUM  600 + D PO) Take 1 tablet by mouth every 3 (three) days.    [provider]  carvedilol  (COREG ) 6.25 MG tablet TAKE 2 TABLETS (12.5 MG TOTAL) BY MOUTH 2 (TWO) TIMES DAILY WITH A MEAL. 10/26/23 04/23/24  Bensimhon, Toribio SAUNDERS, MD  Cholecalciferol  (VITAMIN D3) 50 MCG  (2000 UT) TABS Take 2,000 Units by mouth daily.    [provider]  fluticasone  (FLONASE ) 50 MCG/ACT nasal spray Place 2 sprays into both nostrils as needed for allergies or rhinitis.    [provider]  hydrALAZINE  (APRESOLINE ) 100 MG tablet Take 1 tablet (100 mg total) by mouth every 8 (eight) hours. 04/18/21   Jillian Buttery, MD  ibuprofen  (ADVIL ) 200 MG tablet Take 200 mg by mouth every 6 (six) hours as needed for moderate pain (pain score 4-6).    [provider]  isosorbide  mononitrate (IMDUR ) 60 MG 24 hr tablet Take 1 tablet (60 mg total) by mouth daily. 04/24/23 07/26/24  Glena Harlene HERO, FNP  KLOR-CON  M20 20 MEQ tablet TAKE 2 TABLETS BY MOUTH 2 TIMES DAILY. 07/31/23   Bensimhon, Toribio SAUNDERS, MD  metolazone  (ZAROXOLYN ) 2.5 MG tablet Take 2.5 mg by mouth once a week.    [provider]  Misc Natural Products (PUMPKIN SEED OIL) CAPS Take 2 capsules by mouth in the morning.    [provider]  tamsulosin  (FLOMAX ) 0.4 MG CAPS capsule Take 1 capsule (0.4 mg total) by mouth daily. 02/27/23   Jimmy Charlie FERNS, MD  torsemide  (DEMADEX ) 20 MG tablet TAKE 3 TABLETS TWICE A DAY 09/03/23   Bensimhon, Toribio SAUNDERS, MD    Allergies: Other    Review of Systems  All other systems reviewed and are negative.  Updated Vital Signs BP (!) 160/50 (BP Location: Right Arm)   Pulse 63   Temp 98.2 F (36.8 C) (Oral)   Resp 20   Ht 5' 9 (1.753 m)   Wt (!) 139.7 kg   SpO2 97%   BMI 45.48 kg/m   Physical Exam Vitals and nursing note reviewed.  Constitutional:      General: He is not in acute distress.    Appearance: He is well-developed. He is not diaphoretic.  HENT:     Head: Normocephalic and atraumatic.     Mouth/Throat:     Mouth: Mucous membranes are moist.     Pharynx: Posterior oropharyngeal erythema present. No pharyngeal swelling or oropharyngeal exudate.     Tonsils: No tonsillar exudate or tonsillar abscesses.  Cardiovascular:     Rate and Rhythm:  Normal rate and regular rhythm.     Heart sounds: No murmur heard.    No friction rub.  Pulmonary:     Effort: Pulmonary effort is normal. No respiratory distress.     Breath sounds: Normal breath sounds. No wheezing or rales.  Musculoskeletal:        General: Normal range of motion.     Cervical back: Normal range of motion and neck supple.  Skin:    General: Skin is warm and dry.  Neurological:     Mental Status: He is alert and oriented to person, place, and time.     Coordination: Coordination normal.     (all labs ordered are listed, but only abnormal results are displayed) Labs Reviewed  GROUP A STREP BY PCR    EKG: None  Radiology: No results found.   Procedures   Medications Ordered in the ED  predniSONE  (DELTASONE ) tablet 20 mg (has no administration in time range)                                    Medical Decision Making Risk Prescription drug management.   Strep test is negative.  Symptoms likely viral in nature.  I will prescribe prednisone  and see if this helps.  I will also give a printed prescription for Keflex which he can fill if not improving in the next few days.     Final diagnoses:  None    ED Discharge Orders     None          Geroldine Berg, MD 01/07/24 [phone]

## 2024-01-07 NOTE — Discharge Instructions (Addendum)
 Begin taking prednisone  as prescribed.  If symptoms are not improving in the next 2 to 3 days, fill the prescription for Keflex you have been given this evening.  Follow-up with primary doctor if not improving in the next week.

## 2024-01-25 ENCOUNTER — Other Ambulatory Visit: Payer: Self-pay

## 2024-01-25 ENCOUNTER — Emergency Department (HOSPITAL_BASED_OUTPATIENT_CLINIC_OR_DEPARTMENT_OTHER): Admitting: Radiology

## 2024-01-25 ENCOUNTER — Encounter (HOSPITAL_BASED_OUTPATIENT_CLINIC_OR_DEPARTMENT_OTHER): Payer: Self-pay

## 2024-01-25 ENCOUNTER — Emergency Department (HOSPITAL_BASED_OUTPATIENT_CLINIC_OR_DEPARTMENT_OTHER)
Admission: EM | Admit: 2024-01-25 | Discharge: 2024-01-25 | Disposition: A | Discharge status: Home / Self Care | Attending: Emergency Medicine | Admitting: Emergency Medicine

## 2024-01-25 DIAGNOSIS — I4891 Unspecified atrial fibrillation: Secondary | ICD-10-CM | POA: Insufficient documentation

## 2024-01-25 DIAGNOSIS — J4489 Other specified chronic obstructive pulmonary disease: Secondary | ICD-10-CM | POA: Diagnosis not present

## 2024-01-25 DIAGNOSIS — N189 Chronic kidney disease, unspecified: Secondary | ICD-10-CM | POA: Insufficient documentation

## 2024-01-25 DIAGNOSIS — I130 Hypertensive heart and chronic kidney disease with heart failure and stage 1 through stage 4 chronic kidney disease, or unspecified chronic kidney disease: Secondary | ICD-10-CM | POA: Insufficient documentation

## 2024-01-25 DIAGNOSIS — I5023 Acute on chronic systolic (congestive) heart failure: Secondary | ICD-10-CM | POA: Insufficient documentation

## 2024-01-25 DIAGNOSIS — J069 Acute upper respiratory infection, unspecified: Secondary | ICD-10-CM | POA: Diagnosis not present

## 2024-01-25 DIAGNOSIS — Z79899 Other long term (current) drug therapy: Secondary | ICD-10-CM | POA: Diagnosis not present

## 2024-01-25 DIAGNOSIS — Z7901 Long term (current) use of anticoagulants: Secondary | ICD-10-CM | POA: Diagnosis not present

## 2024-01-25 DIAGNOSIS — R059 Cough, unspecified: Secondary | ICD-10-CM | POA: Diagnosis present

## 2024-01-25 LAB — BASIC METABOLIC PANEL WITH GFR
Anion gap: 10 (ref 5–15)
BUN: 57 mg/dL — ABNORMAL HIGH (ref 8–23)
CO2: 27 mmol/L (ref 22–32)
Calcium: 9.8 mg/dL (ref 8.9–10.3)
Chloride: 105 mmol/L (ref 98–111)
Creatinine, Ser: 3.87 mg/dL — ABNORMAL HIGH (ref 0.61–1.24)
GFR, Estimated: 17 mL/min — ABNORMAL LOW (ref >60)
Glucose, Bld: 145 mg/dL — ABNORMAL HIGH (ref 70–99)
Potassium: 4.3 mmol/L (ref 3.5–5.1)
Sodium: 142 mmol/L (ref 135–145)

## 2024-01-25 LAB — MAGNESIUM: Magnesium: 2.1 mg/dL (ref 1.7–2.4)

## 2024-01-25 LAB — CBC
HCT: 32.7 % — ABNORMAL LOW (ref 39.0–52.0)
Hemoglobin: 10.5 g/dL — ABNORMAL LOW (ref 13.0–17.0)
MCH: 30.2 pg (ref 26.0–34.0)
MCHC: 32.1 g/dL (ref 30.0–36.0)
MCV: 94 fL (ref 80.0–100.0)
Platelets: 93 K/uL — ABNORMAL LOW (ref 150–400)
RBC: 3.48 MIL/uL — ABNORMAL LOW (ref 4.22–5.81)
RDW: 16.2 % — ABNORMAL HIGH (ref 11.5–15.5)
WBC: 9.1 K/uL (ref 4.0–10.5)
nRBC: 0 % (ref 0.0–0.2)

## 2024-01-25 LAB — RESP PANEL BY RT-PCR (RSV, FLU A&B, COVID)  RVPGX2
Influenza A by PCR: NEGATIVE
Influenza B by PCR: NEGATIVE
Resp Syncytial Virus by PCR: NEGATIVE
SARS Coronavirus 2 by RT PCR: NEGATIVE

## 2024-01-25 LAB — PRO BRAIN NATRIURETIC PEPTIDE: Pro Brain Natriuretic Peptide: 15808 pg/mL — ABNORMAL HIGH (ref <300.0)

## 2024-01-25 MED ORDER — HYDROCODONE-ACETAMINOPHEN 7.5-325 MG/15ML PO SOLN
10.0000 mL | Freq: Once | ORAL | Status: AC
  Administered 2024-01-25: 10 mL via ORAL
  Filled 2024-01-25: qty 15

## 2024-01-25 MED ORDER — DEXTROSE 5 % IV SOLN
120.0000 mg | Freq: Once | INTRAVENOUS | Status: AC
  Administered 2024-01-25: 120 mg via INTRAVENOUS
  Filled 2024-01-25: qty 12

## 2024-01-25 MED ORDER — METOPROLOL TARTRATE 5 MG/5ML IV SOLN
5.0000 mg | Freq: Once | INTRAVENOUS | Status: AC
  Administered 2024-01-25: 5 mg via INTRAVENOUS
  Filled 2024-01-25: qty 5

## 2024-01-25 NOTE — ED Notes (Signed)
 Pt ambulated in dept on room air, SATS 96-97% HR 79. No distress noted. Pt returned back to room and placed on monitor

## 2024-01-25 NOTE — Discharge Instructions (Signed)
 You were seen today for cough and upper respiratory symptoms.  This is likely viral in nature.  You may have a mild component of CHF.  Make sure that you are taking your torsemide  and follow-up closely with your primary doctor.

## 2024-01-25 NOTE — ED Triage Notes (Signed)
 Pt reports productive cough x 2 days with brown sputum. Has  been taking OTC medications with minimal improvement.   Says he feels like his chest in burning when he coughs.   Also says he feels like he may have gone back in afib. Compliant on eliquis .

## 2024-01-25 NOTE — ED Provider Notes (Signed)
 [place] EMERGENCY DEPARTMENT AT [facility] Provider Note   CSN: [id number] Arrival date & time: 01/25/24  9944     Patient presents with: Cough   Phillip [name] is a 63 y.o. male.  {Add pertinent medical, surgical, social history, OB history to HPI:32947} HPI     This is a 63 year old male with a history of A-fib on Eliquis , chronic renal disease who presents with upper respiratory symptoms.  Patient reports 2-day history of cough productive of brown sputum.  No relief with over-the-counter medications.  No fevers at home.  States that he has a burning in his chest when he coughs.  Also reports that he feels like he may have gone back into atrial fibrillation.  Denies any chest pain.  No significant lower extremity swelling.  No known sick contacts  Prior to Admission medications   Medication Sig Start Date End Date Taking? Authorizing Provider  albuterol  (PROAIR  HFA) 108 (90 Base) MCG/ACT inhaler Inhale 2 puffs into the lungs every 4 (four) hours as needed for wheezing or shortness of breath. 05/14/18   Darlean Ozell NOVAK, MD  allopurinol  (ZYLOPRIM ) 300 MG tablet TAKE 1 TABLET BY MOUTH EVERY DAY 06/18/23   Jimmy Charlie FERNS, MD  amiodarone  (PACERONE ) 200 MG tablet Take 0.5 tablets (100 mg total) by mouth daily. 10/08/23   Fenton, Clint R, PA  apixaban  (ELIQUIS ) 5 MG TABS tablet TAKE 1 TABLET BY MOUTH TWICE A DAY 09/21/23   Fenton, Clint R, PA  benzonatate  (TESSALON ) 200 MG capsule Take 1 capsule (200 mg total) by mouth 2 (two) times daily as needed for cough. 04/25/23   Blair, Diane W, FNP  budesonide -formoterol  (SYMBICORT ) 80-4.5 MCG/ACT inhaler Take 2 puffs first thing in am and then another 2 puffs about 12 hours later. 04/16/23   Wert, Michael B, MD  calcitRIOL (ROCALTROL) 0.25 MCG capsule Take 0.25 mcg by mouth. 10/21/23 10/20/24  [provider]  Calcium  Carb-Cholecalciferol  (CALCIUM  600 + D PO) Take 1 tablet by mouth every 3 (three) days.    [provider]   carvedilol  (COREG ) 6.25 MG tablet TAKE 2 TABLETS (12.5 MG TOTAL) BY MOUTH 2 (TWO) TIMES DAILY WITH A MEAL. 10/26/23 04/23/24  Bensimhon, Toribio SAUNDERS, MD  cephALEXin (KEFLEX) 500 MG capsule Take 1 capsule (500 mg total) by mouth 4 (four) times daily. 01/07/24   Geroldine Berg, MD  Cholecalciferol  (VITAMIN D3) 50 MCG (2000 UT) TABS Take 2,000 Units by mouth daily.    [provider]  fluticasone  (FLONASE ) 50 MCG/ACT nasal spray Place 2 sprays into both nostrils as needed for allergies or rhinitis.    [provider]  hydrALAZINE  (APRESOLINE ) 100 MG tablet Take 1 tablet (100 mg total) by mouth every 8 (eight) hours. 04/18/21   Jillian Buttery, MD  ibuprofen  (ADVIL ) 200 MG tablet Take 200 mg by mouth every 6 (six) hours as needed for moderate pain (pain score 4-6).    [provider]  isosorbide  mononitrate (IMDUR ) 60 MG 24 hr tablet Take 1 tablet (60 mg total) by mouth daily. 04/24/23 07/26/24  Glena Harlene HERO, FNP  KLOR-CON  M20 20 MEQ tablet TAKE 2 TABLETS BY MOUTH 2 TIMES DAILY. 07/31/23   Bensimhon, Toribio SAUNDERS, MD  metolazone  (ZAROXOLYN ) 2.5 MG tablet Take 2.5 mg by mouth once a week.    [provider]  Misc Natural Products (PUMPKIN SEED OIL) CAPS Take 2 capsules by mouth in the morning.    [provider]  predniSONE  (DELTASONE ) 10 MG tablet Take 2  tablets (20 mg total) by mouth 2 (two) times daily with a meal. 01/07/24   Geroldine Berg, MD  tamsulosin  (FLOMAX ) 0.4 MG CAPS capsule Take 1 capsule (0.4 mg total) by mouth daily. 02/27/23   Jimmy Charlie FERNS, MD  torsemide  (DEMADEX ) 20 MG tablet TAKE 3 TABLETS TWICE A DAY 09/03/23   Bensimhon, Toribio SAUNDERS, MD    Allergies: Other    Review of Systems  Constitutional:  Negative for fever.  Respiratory:  Positive for cough and shortness of breath.   Cardiovascular:  Negative for chest pain.  Gastrointestinal:  Negative for abdominal pain.  All other systems reviewed and are negative.   Updated Vital Signs BP (!)  145/65   Pulse 75   Temp 98.8 F (37.1 C) (Oral)   Resp 20   Ht 1.753 m (5' 9)   Wt (!) 141.1 kg   SpO2 94%   BMI 45.93 kg/m   Physical Exam Vitals and nursing note reviewed.  Constitutional:      Appearance: He is well-developed. He is obese. He is not ill-appearing.  HENT:     Head: Normocephalic and atraumatic.  Eyes:     Pupils: Pupils are equal, round, and reactive to light.  Cardiovascular:     Rate and Rhythm: Normal rate and regular rhythm.     Heart sounds: Normal heart sounds. No murmur heard. Pulmonary:     Effort: Pulmonary effort is normal. No respiratory distress.     Breath sounds: Normal breath sounds. No wheezing.     Comments: Distant breath sounds, fair air movement Abdominal:     Palpations: Abdomen is soft.     Tenderness: There is no abdominal tenderness. There is no rebound.  Musculoskeletal:     Cervical back: Neck supple.  Lymphadenopathy:     Cervical: No cervical adenopathy.  Skin:    General: Skin is warm and dry.  Neurological:     Mental Status: He is alert and oriented to person, place, and time.  Psychiatric:        Mood and Affect: Mood normal.     (all labs ordered are listed, but only abnormal results are displayed) Labs Reviewed  BASIC METABOLIC PANEL WITH GFR - Abnormal; Notable for the following components:      Result Value   Glucose, Bld 145 (*)    BUN 57 (*)    Creatinine, Ser 3.87 (*)    GFR, Estimated 17 (*)    All other components within normal limits  CBC - Abnormal; Notable for the following components:   RBC 3.48 (*)    Hemoglobin 10.5 (*)    HCT 32.7 (*)    RDW 16.2 (*)    Platelets 93 (*)    All other components within normal limits  RESP PANEL BY RT-PCR (RSV, FLU A&B, COVID)  RVPGX2  MAGNESIUM  PRO BRAIN NATRIURETIC PEPTIDE    EKG: None  Radiology: DG Chest 2 View Result Date: 01/25/2024 EXAM: 2 VIEW(S) XRAY OF THE CHEST 01/25/2024 01:35:00 AM COMPARISON: 04/13/2023 CLINICAL HISTORY: cough cough  FINDINGS: LUNGS AND PLEURA: No focal pulmonary opacity. Vascular congestion. No pleural effusion. No pneumothorax. HEART AND MEDIASTINUM: Mild cardiomegaly. BONES AND SOFT TISSUES: No acute osseous abnormality. IMPRESSION: 1. Mild cardiomegaly with vascular congestion. Electronically signed by: Franky Crease MD 01/25/2024 01:43 AM EST RP Workstation: [id number]    {Document cardiac monitor, telemetry assessment procedure when appropriate:32947} Procedures   Medications Ordered in the ED  furosemide  (LASIX ) 120 mg in dextrose  5 %  50 mL IVPB (has no administration in time range)  metoprolol tartrate (LOPRESSOR) injection 5 mg (5 mg Intravenous Given 01/25/24 0143)  HYDROcodone -acetaminophen  (HYCET) 7.5-325 mg/15 ml solution 10 mL (10 mLs Oral Given 01/25/24 0256)      {Click here for ABCD2, HEART and other calculators REFRESH Note before signing:1}                              Medical Decision Making Amount and/or Complexity of Data Reviewed Labs: ordered. Radiology: ordered.  Risk Prescription drug management.   ***  {Document critical care time when appropriate  Document review of labs and clinical decision tools ie CHADS2VASC2, etc  Document your independent review of radiology images and any outside records  Document your discussion with family members, caretakers and with consultants  Document social determinants of health affecting pt's care  Document your decision making why or why not admission, treatments were needed:32947:::1}   Final diagnoses:  None    ED Discharge Orders     None

## 2024-02-03 ENCOUNTER — Encounter: Payer: Self-pay | Admitting: Family Medicine

## 2024-02-03 ENCOUNTER — Ambulatory Visit: Admitting: Family Medicine

## 2024-02-03 VITALS — BP 153/89 | HR 71 | Temp 98.4°F | Resp 18 | Ht 69.0 in | Wt 321.0 lb

## 2024-02-03 DIAGNOSIS — E782 Mixed hyperlipidemia: Secondary | ICD-10-CM

## 2024-02-03 DIAGNOSIS — I4891 Unspecified atrial fibrillation: Secondary | ICD-10-CM

## 2024-02-03 DIAGNOSIS — N184 Chronic kidney disease, stage 4 (severe): Secondary | ICD-10-CM

## 2024-02-03 DIAGNOSIS — I5043 Acute on chronic combined systolic (congestive) and diastolic (congestive) heart failure: Secondary | ICD-10-CM | POA: Diagnosis not present

## 2024-02-03 NOTE — Progress Notes (Unsigned)
 Established Patient Office Visit  Subjective   Patient ID: Phillip [name], male    DOB: September 20, 1960  Age: 63 y.o. MRN: [id number]  Chief Complaint  Patient presents with   Hospitalization Follow-up    HPI Phillip [name] is a 63 y.o. male who has CKD, congestive heart failure, atrial flutter s/p ablation, OSA, HTN, asthma, gout, history of pulmonary embolism, history of left leg DVT in 2010, Lacunar infarcts in basal ganglia, Atrial fibrillation-ablation in May 2019, failed cardioversion in February 2021, ablation July 2023, and DC cardioversion February 2025. Baseline renal function creatinine of 4.17/GFR 15-17. PTH remains elevated at 429.  Takes torsemide  20 mg 3 tablets twice a day.  If his weight goes up he takes Metolazone  2.5 mg with his torsemide . 10 days ago he started with a sinus headache and sinus congestion and then it got into his chest.  He first presented to the ED 01/07/2024 with pharyngitis.  His strep test was negative and it was assumed he had a viral pharyngitis.  He was given a prescription for Keflex which he could fill if he did not improve within a few days.  He did fill the Keflex but reports it was not very effective.   He returned to the ED 01/25/2024 with mucopurulent productive cough and a burning sensation in his chest with coughing.  He also felt he was back into atrial fibrillation.  He was in atrial fibrillation.  Chest x-ray did not show pneumonia but did show some early vascular congestion.  His BNP was 15,000.  He denied fever or chills.  He was given IV Lasix  in the ED and had good diuresis.  His COVID and flu test were negative.  He was coughing up brown sputum.  He reports that his sputum became yellow and since he has been on antibiotics it is now clear.  He gets some shortness of breath with his atrial fibrillation.  His legs swell during the day and then in the evening he props them up and the swelling goes down.  He has tried compression stockings but did not like  them.  He weighs himself daily and adjust his metolazone  usage based off his weight.  He is on Eliquis  5 mg twice a day, amiodarone  200 mg daily (takes a half a tablet). He checks his weight every day today was 113.8 yesterday was 116.0.  He typically takes metolazone  on Friday or Saturday but if he gains weight during the week then he will take metolazone  on Tuesday or Wednesday as well.  His weight is good today.    Objective:     BP (!) 153/89 (BP Location: Right Arm, Patient Position: Sitting, Cuff Size: Large)   Pulse 71   Temp 98.4 F (36.9 C) (Oral)   Resp 18   Ht 5' 9 (1.753 m)   Wt (!) 321 lb (145.6 kg)   SpO2 90%   BMI 47.40 kg/m  {Vitals History (Optional):23777}  Physical Exam Vitals and nursing note reviewed.  Constitutional:      Appearance: Normal appearance.  HENT:     Head: Normocephalic and atraumatic.  Eyes:     Conjunctiva/sclera: Conjunctivae normal.  Cardiovascular:     Rate and Rhythm: Normal rate and regular rhythm.  Pulmonary:     Effort: Pulmonary effort is normal.     Breath sounds: Normal breath sounds.  Musculoskeletal:     Right lower leg: No edema.     Left lower leg: No edema.  Skin:  General: Skin is warm and dry.  Neurological:     Mental Status: He is alert and oriented to person, place, and time.  Psychiatric:        Mood and Affect: Mood normal.        Behavior: Behavior normal.        Thought Content: Thought content normal.        Judgment: Judgment normal.      {Perform Simple Foot Exam  Perform Detailed exam:1} {Insert foot Exam (Optional):30965}   Results for orders placed or performed in visit on 02/03/24  B Nat Peptide  Result Value Ref Range   BNP 248.6 (H) 0.0 - 100.0 pg/mL  CMP14+EGFR  Result Value Ref Range   Glucose 83 70 - 99 mg/dL   BUN 68 (H) 8 - 27 mg/dL   Creatinine, Ser 6.05 (H) 0.76 - 1.27 mg/dL   eGFR 17 (L) >40 fO/fpw/8.26   BUN/Creatinine Ratio 17 10 - 24   Sodium 142 134 - 144 mmol/L    Potassium 3.9 3.5 - 5.2 mmol/L   Chloride 99 96 - 106 mmol/L   CO2 27 20 - 29 mmol/L   Calcium  8.9 8.6 - 10.2 mg/dL   Total Protein 5.8 (L) 6.0 - 8.5 g/dL   Albumin  3.8 (L) 3.9 - 4.9 g/dL   Globulin, Total 2.0 1.5 - 4.5 g/dL   Bilirubin Total 0.5 0.0 - 1.2 mg/dL   Alkaline Phosphatase 64 47 - 123 IU/L   AST 20 0 - 40 IU/L   ALT 15 0 - 44 IU/L  TSH + free T4  Result Value Ref Range   TSH 1.830 0.450 - 4.500 uIU/mL   Free T4 1.86 (H) 0.82 - 1.77 ng/dL    {Labs ([place]):76220}  The ASCVD Risk score (Arnett DK, et al., 2019) failed to calculate for the following reasons:   Cannot find a previous HDL lab   Cannot find a previous total cholesterol lab    Assessment & Plan:  Atrial fibrillation, unspecified type (HCC) -     EKG 12-Lead -     Brain natriuretic peptide -     TSH + free T4  CKD (chronic kidney disease) stage 4, GFR 15-29 ml/min (HCC) -     CMP14+EGFR -     CBC with Differential/Platelet; Future  Mixed hyperlipidemia -     Lipid panel; Future  Acute on chronic combined systolic and diastolic CHF (congestive heart failure) (HCC) Assessment & Plan: His weight is good at 313.8 today.  He does not need additional diuresis from his baseline of metolazone  2.5 mg weekly Demadex  20 mg 3 tablets twice daily.  Will check his BMP and see what his baseline is.   Other orders -     EKG     No follow-ups on file.    Sugar Vanzandt K Maeva Dant, MD

## 2024-02-04 LAB — CMP14+EGFR
ALT: 15 IU/L (ref 0–44)
AST: 20 IU/L (ref 0–40)
Albumin: 3.8 g/dL — ABNORMAL LOW (ref 3.9–4.9)
Alkaline Phosphatase: 64 IU/L (ref 47–123)
BUN/Creatinine Ratio: 17 (ref 10–24)
BUN: 68 mg/dL — ABNORMAL HIGH (ref 8–27)
Bilirubin Total: 0.5 mg/dL (ref 0.0–1.2)
CO2: 27 mmol/L (ref 20–29)
Calcium: 8.9 mg/dL (ref 8.6–10.2)
Chloride: 99 mmol/L (ref 96–106)
Creatinine, Ser: 3.94 mg/dL — ABNORMAL HIGH (ref 0.76–1.27)
Globulin, Total: 2 g/dL (ref 1.5–4.5)
Glucose: 83 mg/dL (ref 70–99)
Potassium: 3.9 mmol/L (ref 3.5–5.2)
Sodium: 142 mmol/L (ref 134–144)
Total Protein: 5.8 g/dL — ABNORMAL LOW (ref 6.0–8.5)
eGFR: 17 mL/min/1.73 — ABNORMAL LOW (ref >59)

## 2024-02-04 LAB — TSH+FREE T4
Free T4: 1.86 ng/dL — ABNORMAL HIGH (ref 0.82–1.77)
TSH: 1.83 u[IU]/mL (ref 0.450–4.500)

## 2024-02-04 LAB — BRAIN NATRIURETIC PEPTIDE: BNP: 248.6 pg/mL — ABNORMAL HIGH (ref 0.0–100.0)

## 2024-02-05 ENCOUNTER — Ambulatory Visit: Payer: Self-pay | Admitting: Family Medicine

## 2024-02-07 NOTE — Assessment & Plan Note (Signed)
 His weight is good at 313.8 today.  He does not need additional diuresis from his baseline of metolazone  2.5 mg weekly Demadex  20 mg 3 tablets twice daily.  Will check his BMP and see what his baseline is.

## 2024-02-08 NOTE — Assessment & Plan Note (Signed)
 EKG shows he is in atrial fibrillation and has an incomplete left bundle branch block.  He has minimal voltage criteria for LVH and prolonged QT.  His rate is 73

## 2024-02-09 ENCOUNTER — Other Ambulatory Visit (HOSPITAL_COMMUNITY): Payer: Self-pay | Admitting: Internal Medicine

## 2024-02-09 DIAGNOSIS — Z79899 Other long term (current) drug therapy: Secondary | ICD-10-CM

## 2024-02-09 DIAGNOSIS — E876 Hypokalemia: Secondary | ICD-10-CM

## 2024-02-15 ENCOUNTER — Telehealth (HOSPITAL_COMMUNITY): Payer: Self-pay

## 2024-02-15 NOTE — Telephone Encounter (Signed)
 Called to confirm/remind patient of their appointment at [facility] on 02/16/24 9:00.   Appointment:   [x] Confirmed  [] Left mess   [] No answer/No voice mail  [] VM Full/unable to leave message  [] Phone not in service  Patient reminded to bring all medications and/or complete list.  Confirmed patient has transportation. Gave directions, instructed to utilize valet parking.

## 2024-02-16 ENCOUNTER — Ambulatory Visit (HOSPITAL_COMMUNITY)
Admission: RE | Admit: 2024-02-16 | Discharge: 2024-02-16 | Disposition: A | Discharge status: Home / Self Care | Source: Ambulatory Visit | Attending: Cardiology | Admitting: Cardiology

## 2024-02-16 ENCOUNTER — Encounter (HOSPITAL_COMMUNITY): Payer: Self-pay

## 2024-02-16 ENCOUNTER — Ambulatory Visit (HOSPITAL_COMMUNITY): Payer: Self-pay | Admitting: Cardiology

## 2024-02-16 ENCOUNTER — Other Ambulatory Visit (HOSPITAL_COMMUNITY): Payer: Self-pay | Admitting: *Deleted

## 2024-02-16 VITALS — BP 154/66 | HR 85 | Ht 69.0 in | Wt 320.6 lb

## 2024-02-16 DIAGNOSIS — Z7951 Long term (current) use of inhaled steroids: Secondary | ICD-10-CM | POA: Insufficient documentation

## 2024-02-16 DIAGNOSIS — N184 Chronic kidney disease, stage 4 (severe): Secondary | ICD-10-CM | POA: Diagnosis not present

## 2024-02-16 DIAGNOSIS — Z7901 Long term (current) use of anticoagulants: Secondary | ICD-10-CM | POA: Diagnosis not present

## 2024-02-16 DIAGNOSIS — I5022 Chronic systolic (congestive) heart failure: Secondary | ICD-10-CM

## 2024-02-16 DIAGNOSIS — I4430 Unspecified atrioventricular block: Secondary | ICD-10-CM | POA: Diagnosis not present

## 2024-02-16 DIAGNOSIS — I483 Typical atrial flutter: Secondary | ICD-10-CM | POA: Diagnosis not present

## 2024-02-16 DIAGNOSIS — Z79899 Other long term (current) drug therapy: Secondary | ICD-10-CM | POA: Insufficient documentation

## 2024-02-16 DIAGNOSIS — I5042 Chronic combined systolic (congestive) and diastolic (congestive) heart failure: Secondary | ICD-10-CM

## 2024-02-16 DIAGNOSIS — Z86718 Personal history of other venous thrombosis and embolism: Secondary | ICD-10-CM | POA: Diagnosis not present

## 2024-02-16 DIAGNOSIS — I10 Essential (primary) hypertension: Secondary | ICD-10-CM

## 2024-02-16 DIAGNOSIS — I493 Ventricular premature depolarization: Secondary | ICD-10-CM | POA: Diagnosis not present

## 2024-02-16 DIAGNOSIS — Z86711 Personal history of pulmonary embolism: Secondary | ICD-10-CM | POA: Diagnosis not present

## 2024-02-16 DIAGNOSIS — N179 Acute kidney failure, unspecified: Secondary | ICD-10-CM | POA: Insufficient documentation

## 2024-02-16 DIAGNOSIS — I480 Paroxysmal atrial fibrillation: Secondary | ICD-10-CM | POA: Diagnosis not present

## 2024-02-16 DIAGNOSIS — Z87891 Personal history of nicotine dependence: Secondary | ICD-10-CM | POA: Diagnosis not present

## 2024-02-16 DIAGNOSIS — I447 Left bundle-branch block, unspecified: Secondary | ICD-10-CM | POA: Insufficient documentation

## 2024-02-16 DIAGNOSIS — J4489 Other specified chronic obstructive pulmonary disease: Secondary | ICD-10-CM | POA: Insufficient documentation

## 2024-02-16 DIAGNOSIS — I130 Hypertensive heart and chronic kidney disease with heart failure and stage 1 through stage 4 chronic kidney disease, or unspecified chronic kidney disease: Secondary | ICD-10-CM | POA: Insufficient documentation

## 2024-02-16 DIAGNOSIS — I4892 Unspecified atrial flutter: Secondary | ICD-10-CM

## 2024-02-16 DIAGNOSIS — G4733 Obstructive sleep apnea (adult) (pediatric): Secondary | ICD-10-CM | POA: Diagnosis not present

## 2024-02-16 DIAGNOSIS — I351 Nonrheumatic aortic (valve) insufficiency: Secondary | ICD-10-CM | POA: Insufficient documentation

## 2024-02-16 DIAGNOSIS — D509 Iron deficiency anemia, unspecified: Secondary | ICD-10-CM | POA: Diagnosis not present

## 2024-02-16 DIAGNOSIS — Z6841 Body Mass Index (BMI) 40.0 and over, adult: Secondary | ICD-10-CM | POA: Insufficient documentation

## 2024-02-16 LAB — BASIC METABOLIC PANEL WITH GFR
Anion gap: 9 (ref 5–15)
BUN: 62 mg/dL — ABNORMAL HIGH (ref 8–23)
CO2: 32 mmol/L (ref 22–32)
Calcium: 9.3 mg/dL (ref 8.9–10.3)
Chloride: 102 mmol/L (ref 98–111)
Creatinine, Ser: 4.01 mg/dL — ABNORMAL HIGH (ref 0.61–1.24)
GFR, Estimated: 16 mL/min — ABNORMAL LOW (ref >60)
Glucose, Bld: 82 mg/dL (ref 70–99)
Potassium: 3.9 mmol/L (ref 3.5–5.1)
Sodium: 143 mmol/L (ref 135–145)

## 2024-02-16 LAB — CBC
HCT: 37.4 % — ABNORMAL LOW (ref 39.0–52.0)
Hemoglobin: 11.6 g/dL — ABNORMAL LOW (ref 13.0–17.0)
MCH: 29.9 pg (ref 26.0–34.0)
MCHC: 31 g/dL (ref 30.0–36.0)
MCV: 96.4 fL (ref 80.0–100.0)
Platelets: 108 K/uL — ABNORMAL LOW (ref 150–400)
RBC: 3.88 MIL/uL — ABNORMAL LOW (ref 4.22–5.81)
RDW: 16.4 % — ABNORMAL HIGH (ref 11.5–15.5)
WBC: 3 K/uL — ABNORMAL LOW (ref 4.0–10.5)
nRBC: 0 % (ref 0.0–0.2)

## 2024-02-16 LAB — T4, FREE: Free T4: 1.24 ng/dL — ABNORMAL HIGH (ref 0.61–1.12)

## 2024-02-16 LAB — BRAIN NATRIURETIC PEPTIDE: B Natriuretic Peptide: 638 pg/mL — ABNORMAL HIGH (ref 0.0–100.0)

## 2024-02-16 LAB — TSH: TSH: 2.48 u[IU]/mL (ref 0.350–4.500)

## 2024-02-16 MED ORDER — AMIODARONE HCL 200 MG PO TABS: 200.0000 mg | ORAL_TABLET | Freq: Every day | ORAL | 3 refills | Status: DC

## 2024-02-16 NOTE — H&P (View-Only) (Signed)
 [facility] NOTE   PCP: Ziglar, Devere POUR, MD EP: Dr. Nancey Nephrology: Dr. Dennise HF Cardiologist: Dr Cherrie   HPI: Wilfrid Hyser is a 63 y.o. male hx of PAF, remote PE, hx of DVT, OSA, morbid obesity, CKD Stage III,  HTN, and  combined chronic systolic/diastolic.   Echo 2015: EF 40-45%  Echo 9/18 with EF 55-60% G2DD, mild aortic regurgitation.  Nuc 2008 no ischemia, no hx of cath due to CKD.    Developed AFL and underwent AFL ablation in 5/19.  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 Underwent attempt at [facility] on 04/22/19 but failed to convert. Seen back in AF Clinic, not able to use Tikosyn due to long QT. Not ablation candidate due to obesity. Considered amiodarone . Started on flecainide  but had treadmill to monitor Flecainide  Tx. Patient had significant QRS widening within first minute of exercise =>flecainide  stopped  Echo 5/21 EF 45-50%  Recurrent AF in 9/22.-> DC-CV, amiodarone  started.   Echo 9/22 EF 45-50% Marked LAE   Admitted 1/23 with a/c CHF and afib RVR. Echo EF 40-45%, global LV HK, RV ok. Diuresed and underwent DCCV.   Had AF ablation by Dr. Viola at [facility] 7/23. Saw Afib clinic 8/23 and in rate controlled AF. Wore 7 day monitor - rhythm was sinus with 14% AF burden.  Admitted 1/25 with a/c HF. Echo 1/25 EF 35-40%, mild LVH, G2DD, RV normal, moderate AI. GDMT titrated and he was discharged home, weight 310 lbs.  Recurrent AF 2/25 -> DC-CV 05/11/23.   8 day Zio 3/25 (on amio 200) showed mostly NSR, 2.2% PVC burden. Echo 4/25 EF 40-45%, normal RV, moderate to severe aortic regurgitation AoV mean gradient 8 mmHg, AVA 3.95 cm   He was seen in the ED recently 10/25 for pharyngitis and treated with Keflex . He returned to ED with productive cough 11/25 and was back in atrial fibrillaiton.  He returns today for heart failure follow up. Overall feeling ok, has been having more  shortness of breath since being back in atrial fibrillation. Feels that he is in an out of it. Denies chest pain, near-syncope, orthopnea, dizziness, and abnormal bleeding. Able to perform ADLs. Appetite okay. Has been making poor food choices recently. Weight at home has gone up, but attributes this to his eating habits. Compliant with all medications.  Manages his weight and volume with PRN metaolazone.   Review of systems complete and found to be negative unless listed in HPI.    Past Medical History:  Diagnosis Date   Asthma    CHF (congestive heart failure) (HCC) 11/15   EF 40-45%, improved with follow-up   Chronic kidney disease    COPD (chronic obstructive pulmonary disease) (HCC)    Deep vein thrombosis (DVT) of lower extremity (HCC) 08/17/2015   DVT (deep venous thrombosis) (HCC)    Gout    Hypertension    Hypertensive cardiovascular disease    Kidney dysfunction    Morbid obesity (HCC)    OSA on CPAP    Paroxysmal atrial fibrillation (HCC)    PNA (pneumonia)    Pulmonary embolism (HCC) 2010   left leg DVT   Sleep apnea    uses cpap   Current Outpatient Medications  Medication Sig Dispense Refill   albuterol  (PROAIR  HFA) 108 (90 Base) MCG/ACT inhaler Inhale 2 puffs into the lungs every 4 (four) hours as needed  for wheezing or shortness of breath. 1 Inhaler 3   allopurinol  (ZYLOPRIM ) 300 MG tablet TAKE 1 TABLET BY MOUTH EVERY DAY 90 tablet 3   amiodarone  (PACERONE ) 200 MG tablet Take 0.5 tablets (100 mg total) by mouth daily.     apixaban  (ELIQUIS ) 5 MG TABS tablet TAKE 1 TABLET BY MOUTH TWICE A DAY 60 tablet 6   budesonide -formoterol  (SYMBICORT ) 80-4.5 MCG/ACT inhaler Take 2 puffs first thing in am and then another 2 puffs about 12 hours later. 30.6 g 3   calcitRIOL (ROCALTROL) 0.25 MCG capsule Take 0.25 mcg by mouth.     Calcium  Carb-Cholecalciferol  (CALCIUM  600 + D PO) Take 1 tablet by mouth every 3 (three) days.     carvedilol  (COREG ) 6.25 MG tablet TAKE 2 TABLETS (12.5  MG TOTAL) BY MOUTH 2 (TWO) TIMES DAILY WITH A MEAL. 120 tablet 5   Cholecalciferol  (VITAMIN D3) 50 MCG (2000 UT) TABS Take 2,000 Units by mouth daily.     fluticasone  (FLONASE ) 50 MCG/ACT nasal spray Place 2 sprays into both nostrils as needed for allergies or rhinitis.     hydrALAZINE  (APRESOLINE ) 100 MG tablet Take 1 tablet (100 mg total) by mouth every 8 (eight) hours. 90 tablet 0   ibuprofen  (ADVIL ) 200 MG tablet Take 200 mg by mouth every 6 (six) hours as needed for moderate pain (pain score 4-6).     isosorbide  mononitrate (IMDUR ) 60 MG 24 hr tablet Take 1 tablet (60 mg total) by mouth daily. 90 tablet 3   metolazone  (ZAROXOLYN ) 2.5 MG tablet Take 2.5 mg by mouth once a week.     Misc Natural Products (PUMPKIN SEED OIL) CAPS Take 2 capsules by mouth in the morning.     potassium chloride  SA (KLOR-CON  M) 20 MEQ tablet TAKE 2 TABLETS (40 MEQ) EVERY MORNING AND 1 TABLET (20 MEQ) EVERY EVENING 270 tablet 3   tamsulosin  (FLOMAX ) 0.4 MG CAPS capsule Take 1 capsule (0.4 mg total) by mouth daily. 90 capsule 3   torsemide  (DEMADEX ) 20 MG tablet TAKE 3 TABLETS TWICE A DAY (Patient taking differently: Take 40 mg by mouth 2 (two) times daily.) 540 tablet 3   No current facility-administered medications for this encounter.   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    Comment: Disabled  Tobacco Use   Smoking status: Former    Current packs/day: 0.00    Average packs/day: 0.3 packs/day for 1 year (0.3 ttl pk-yrs)    Types: Cigarettes    Start date: 07/23/1988    Quit date: 07/23/1989    Years since quitting: 34.5    Passive exposure: Past   Smokeless tobacco: Never   Tobacco comments:     Former smoker 03/06/2021  Vaping Use   Vaping status: Never Used  Substance and Sexual Activity   Alcohol use: No    Alcohol/week: 0.0 standard drinks of alcohol   Drug use: No   Sexual activity: Not Currently  Other Topics Concern   Not on file  Social History Narrative   Pt lives in [place], alone.   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   Has 2 sons in [place]       No living will   Would want sons to make decisions for him   Would accept resuscitation   Social Drivers of Health   Financial Resource Strain: Low Risk  (01/19/2018)   Overall Financial Resource Strain (CARDIA)    Difficulty of Paying Living Expenses: Not hard at all  Food Insecurity: No Food Insecurity (04/13/2023)   Hunger Vital Sign    Worried About Running Out of Food in the Last Year: Never true    Ran Out of Food in the Last Year: Never true  Transportation Needs: No Transportation Needs (04/13/2023)   PRAPARE - Administrator, Civil Service (Medical): No    Lack of Transportation (Non-Medical): No  Physical Activity: Not on file  Stress: Not on file  Social Connections: Not on file  Intimate Partner Violence: Not At Risk (04/13/2023)   Humiliation, Afraid, Rape, and Kick questionnaire    Fear of Current or Ex-Partner: No    Emotionally Abused: No    Physically Abused: No    Sexually Abused: No    Family History  Problem Relation Age of Onset   Hypertension Mother    Diabetes Father    CAD Father    Clotting disorder Father    Heart disease Father        transplant   Stomach cancer Father    Allergies Brother    Colon cancer Neg Hx    Colon polyps Neg Hx    Esophageal cancer Neg Hx    Rectal cancer Neg Hx    BP (!) 154/66   Pulse 85   Ht 5' 9 (1.753 m)   Wt (!) 145.4 kg (320 lb 9.6 oz)   SpO2 95%   BMI 47.34 kg/m   Wt Readings from Last 3 Encounters:  02/16/24 (!) 145.4 kg (320 lb 9.6 oz)  02/03/24 (!) 145.6 kg (321 lb)  01/25/24 (!)  141.1 kg (311 lb)   PHYSICAL EXAM: General: Well appearing. No distress  Cardiac: JVP flat. No murmurs  Abdomen: Soft, non-distended. Obese Extremities: Warm and dry.  No edema.  Neuro: A&O x3. Affect pleasant.   ECG (personally reviewed): AFL with variable AV block 78 bpm  ASSESSMENT & PLAN:  1. Chronic HFmEF - Echo 11/2016 LVEF 55-60%, Grade 2 DD. - EF 7/20 back down to 30%. Suspect due to AF but rate not overly elevated. HTN may also be playing a role. - Symptoms improved after DC-CV on 10/11/18  - Echo 11/20 EF 45-50% - Echo 12/23 EF 40-45%, global LV HK, RV ok. - Echo 1/25: EF 35-40%, RV ok - Echo 4/25: EF 40-45%, normal RV, moderate to severe AI - Has not had cath due to CKD and lack of clear anginal symptoms.  - Echo 4/25: EF 40-45%, RV ok, mod to severe AI - NYHA III, worsened with AF. Volume ok. BMET/BNP today - continue torsemide  40 mg bid + 40 KCL bid.   - continue weekly metolazone  2.5 mg, does take extra PRN for weight gain (take additional 40 KCL with) - continue coreg  12.5 mg bid. - continue hydralazine  100 mg tid + Imdur  60 mg daily. - Off Farxiga  due to AKI and refuses to restart due to cost.  2. PAF / Typical A-flutter, paroxysmal - S/P AFL ablation 5/19. - s/p DC-CV for AFib on 10/14/18 and 05/10/19 and 9/22 - S/p AF ablation 7/23 by Dr. Sharron -  Recurrent AF s/p DC-CV 05/11/23. Amio increased to 200 bid x 2 weeks  - 8 day Zio 3/25: mostly NSR, 2.2% PVC burden - Back in AF at PCP office. AFL on ECG today - He is compliant with CPAP. - continue Eliquis  5 mg bid. No bleeding, has not missed doses - increase amiodarone  to 200 mg bid - free T4 mildly elevated at PCP appointment, will repeat today - not a re-do ablation candidate until weight closer to 250 lbs - will schedule DCCV with Dr. Bensimhon in the next week or two - needs to follow up with EP  3. Hypertension - BP elevated today, has not taken meds yet  4. Morbid obesity - Body mass index is 47.34  kg/m.  - Had lost over 100 pounds. Weight is now back up 20 lbs.  - Referred to PharmD for tirzepatide vs semaglutide (insurance may cover with OSA diagnosis)  5. CKD IV - Baseline SCr ~3.0-3.5. - Follows with Dr. Dennise.   6. OSA on CPAP - Compliant with CPAP.  - No change  7. Iron  deficiency anemia - s/p Venofer   - Followed by Heme/Onc  8. PVCs - 8 day Zio 3/25 showed 2.2 PVC burden - on coreg  and amio - No change  9. AI - TEE 12/23 read as mild AI, AoV leaflets not coapting well. Mod AI by Dr. Cherrie read - Echo 4/25 moderate AI - no near syncope or dizziness  Follow up in 2 weeks post cardioversion with APP  Charlann Wayne, NP  8:59 AM

## 2024-02-16 NOTE — Patient Instructions (Signed)
 Increase amiodarone  to 200 mg daily - updated Rx sent. Labs today - will call you if abnormal. We have scheduled a cardioversion for you on 02/22/24 - see below. Return to Heart Failure APP Clinic two weeks after your cardioversion - see below. Please call us  at [phone] if any questions or concerns prior to your next appointment.       Dear Phillip [name]  You are scheduled for a Cardioversion on Monday, December 1 with Dr. Bensimhon.   Please arrive at the [facility] (Main Entrance A) at [facility]: [address] [place], [place] [address] at 10:00 AM (This time is one hour before your procedure to ensure your preparation).   Free valet parking service is available. You will check in at ADMITTING.   *Please Note: You will receive a call the day before your procedure to confirm the appointment time. That time may have changed from the original time based on the schedule for that day.*   DIET:  Nothing to eat or drink after midnight except a sip of water with medications (see medication instructions below)  Medications:   1. Continue taking your anticoagulant (blood thinner): Apixaban  (Eliquis ).  You will need to continue this after your procedure until you are told by your provider that it is safe to stop.    2.  Take all of your medications with a small sip of water morning of procedure except: HOLD YOUR TORSEMIDE  THE MORNING OF YOUR PROCEDURE.    FYI:  For your safety, and to allow us  to monitor your vital signs accurately during the surgery/procedure we request: If you have artificial nails, gel coating, SNS etc, please have those removed prior to your surgery/procedure. Not having the nail coverings /polish removed may result in cancellation or delay of your surgery/procedure.  Your support person will be asked to wait in the waiting room during your procedure.  It is OK to have someone drop you off and come back when you are ready to be discharged.  You cannot drive  after the procedure and will need someone to drive you home.  Bring your insurance cards.  *Special Note: Every effort is made to have your procedure done on time. Occasionally there are emergencies that occur at the hospital that may cause delays. Please be patient if a delay does occur.

## 2024-02-16 NOTE — Addendum Note (Signed)
 Encounter addended by: Washington Geofm NOVAK, RN on: 02/16/2024 10:13 AM  Actions taken: Order list changed, Diagnosis association updated

## 2024-02-16 NOTE — Addendum Note (Signed)
 Encounter addended by: Washington Geofm NOVAK, RN on: 02/16/2024 10:04 AM  Actions taken: Visit diagnoses modified, Pharmacy for encounter modified, Order list changed, Diagnosis association updated, Clinical Note Signed, Charge Capture section accepted

## 2024-02-16 NOTE — Progress Notes (Signed)
 ADVANCED HF CLINIC NOTE   PCP: Ziglar, Devere POUR, MD EP: Dr. Nancey Nephrology: Dr. Dennise HF Cardiologist: Dr Cherrie   HPI: Wilfrid Hyser is a 63 y.o. male hx of PAF, remote PE, hx of DVT, OSA, morbid obesity, CKD Stage III,  HTN, and  combined chronic systolic/diastolic.   Echo 2015: EF 40-45%  Echo 9/18 with EF 55-60% G2DD, mild aortic regurgitation.  Nuc 2008 no ischemia, no hx of cath due to CKD.    Developed AFL and underwent AFL ablation in 5/19.  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 Underwent attempt at [facility] on 04/22/19 but failed to convert. Seen back in AF Clinic, not able to use Tikosyn due to long QT. Not ablation candidate due to obesity. Considered amiodarone . Started on flecainide  but had treadmill to monitor Flecainide  Tx. Patient had significant QRS widening within first minute of exercise =>flecainide  stopped  Echo 5/21 EF 45-50%  Recurrent AF in 9/22.-> DC-CV, amiodarone  started.   Echo 9/22 EF 45-50% Marked LAE   Admitted 1/23 with a/c CHF and afib RVR. Echo EF 40-45%, global LV HK, RV ok. Diuresed and underwent DCCV.   Had AF ablation by Dr. Viola at [facility] 7/23. Saw Afib clinic 8/23 and in rate controlled AF. Wore 7 day monitor - rhythm was sinus with 14% AF burden.  Admitted 1/25 with a/c HF. Echo 1/25 EF 35-40%, mild LVH, G2DD, RV normal, moderate AI. GDMT titrated and he was discharged home, weight 310 lbs.  Recurrent AF 2/25 -> DC-CV 05/11/23.   8 day Zio 3/25 (on amio 200) showed mostly NSR, 2.2% PVC burden. Echo 4/25 EF 40-45%, normal RV, moderate to severe aortic regurgitation AoV mean gradient 8 mmHg, AVA 3.95 cm   He was seen in the ED recently 10/25 for pharyngitis and treated with Keflex . He returned to ED with productive cough 11/25 and was back in atrial fibrillaiton.  He returns today for heart failure follow up. Overall feeling ok, has been having more  shortness of breath since being back in atrial fibrillation. Feels that he is in an out of it. Denies chest pain, near-syncope, orthopnea, dizziness, and abnormal bleeding. Able to perform ADLs. Appetite okay. Has been making poor food choices recently. Weight at home has gone up, but attributes this to his eating habits. Compliant with all medications.  Manages his weight and volume with PRN metaolazone.   Review of systems complete and found to be negative unless listed in HPI.    Past Medical History:  Diagnosis Date   Asthma    CHF (congestive heart failure) (HCC) 11/15   EF 40-45%, improved with follow-up   Chronic kidney disease    COPD (chronic obstructive pulmonary disease) (HCC)    Deep vein thrombosis (DVT) of lower extremity (HCC) 08/17/2015   DVT (deep venous thrombosis) (HCC)    Gout    Hypertension    Hypertensive cardiovascular disease    Kidney dysfunction    Morbid obesity (HCC)    OSA on CPAP    Paroxysmal atrial fibrillation (HCC)    PNA (pneumonia)    Pulmonary embolism (HCC) 2010   left leg DVT   Sleep apnea    uses cpap   Current Outpatient Medications  Medication Sig Dispense Refill   albuterol  (PROAIR  HFA) 108 (90 Base) MCG/ACT inhaler Inhale 2 puffs into the lungs every 4 (four) hours as needed  for wheezing or shortness of breath. 1 Inhaler 3   allopurinol  (ZYLOPRIM ) 300 MG tablet TAKE 1 TABLET BY MOUTH EVERY DAY 90 tablet 3   amiodarone  (PACERONE ) 200 MG tablet Take 0.5 tablets (100 mg total) by mouth daily.     apixaban  (ELIQUIS ) 5 MG TABS tablet TAKE 1 TABLET BY MOUTH TWICE A DAY 60 tablet 6   budesonide -formoterol  (SYMBICORT ) 80-4.5 MCG/ACT inhaler Take 2 puffs first thing in am and then another 2 puffs about 12 hours later. 30.6 g 3   calcitRIOL (ROCALTROL) 0.25 MCG capsule Take 0.25 mcg by mouth.     Calcium  Carb-Cholecalciferol  (CALCIUM  600 + D PO) Take 1 tablet by mouth every 3 (three) days.     carvedilol  (COREG ) 6.25 MG tablet TAKE 2 TABLETS (12.5  MG TOTAL) BY MOUTH 2 (TWO) TIMES DAILY WITH A MEAL. 120 tablet 5   Cholecalciferol  (VITAMIN D3) 50 MCG (2000 UT) TABS Take 2,000 Units by mouth daily.     fluticasone  (FLONASE ) 50 MCG/ACT nasal spray Place 2 sprays into both nostrils as needed for allergies or rhinitis.     hydrALAZINE  (APRESOLINE ) 100 MG tablet Take 1 tablet (100 mg total) by mouth every 8 (eight) hours. 90 tablet 0   ibuprofen  (ADVIL ) 200 MG tablet Take 200 mg by mouth every 6 (six) hours as needed for moderate pain (pain score 4-6).     isosorbide  mononitrate (IMDUR ) 60 MG 24 hr tablet Take 1 tablet (60 mg total) by mouth daily. 90 tablet 3   metolazone  (ZAROXOLYN ) 2.5 MG tablet Take 2.5 mg by mouth once a week.     Misc Natural Products (PUMPKIN SEED OIL) CAPS Take 2 capsules by mouth in the morning.     potassium chloride  SA (KLOR-CON  M) 20 MEQ tablet TAKE 2 TABLETS (40 MEQ) EVERY MORNING AND 1 TABLET (20 MEQ) EVERY EVENING 270 tablet 3   tamsulosin  (FLOMAX ) 0.4 MG CAPS capsule Take 1 capsule (0.4 mg total) by mouth daily. 90 capsule 3   torsemide  (DEMADEX ) 20 MG tablet TAKE 3 TABLETS TWICE A DAY (Patient taking differently: Take 40 mg by mouth 2 (two) times daily.) 540 tablet 3   No current facility-administered medications for this encounter.   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    Comment: Disabled  Tobacco Use   Smoking status: Former    Current packs/day: 0.00    Average packs/day: 0.3 packs/day for 1 year (0.3 ttl pk-yrs)    Types: Cigarettes    Start date: 07/23/1988    Quit date: 07/23/1989    Years since quitting: 34.5    Passive exposure: Past   Smokeless tobacco: Never   Tobacco comments:     Former smoker 03/06/2021  Vaping Use   Vaping status: Never Used  Substance and Sexual Activity   Alcohol use: No    Alcohol/week: 0.0 standard drinks of alcohol   Drug use: No   Sexual activity: Not Currently  Other Topics Concern   Not on file  Social History Narrative   Pt lives in [place], alone.   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   Has 2 sons in [place]       No living will   Would want sons to make decisions for him   Would accept resuscitation   Social Drivers of Health   Financial Resource Strain: Low Risk  (01/19/2018)   Overall Financial Resource Strain (CARDIA)    Difficulty of Paying Living Expenses: Not hard at all  Food Insecurity: No Food Insecurity (04/13/2023)   Hunger Vital Sign    Worried About Running Out of Food in the Last Year: Never true    Ran Out of Food in the Last Year: Never true  Transportation Needs: No Transportation Needs (04/13/2023)   PRAPARE - Administrator, Civil Service (Medical): No    Lack of Transportation (Non-Medical): No  Physical Activity: Not on file  Stress: Not on file  Social Connections: Not on file  Intimate Partner Violence: Not At Risk (04/13/2023)   Humiliation, Afraid, Rape, and Kick questionnaire    Fear of Current or Ex-Partner: No    Emotionally Abused: No    Physically Abused: No    Sexually Abused: No    Family History  Problem Relation Age of Onset   Hypertension Mother    Diabetes Father    CAD Father    Clotting disorder Father    Heart disease Father        transplant   Stomach cancer Father    Allergies Brother    Colon cancer Neg Hx    Colon polyps Neg Hx    Esophageal cancer Neg Hx    Rectal cancer Neg Hx    BP (!) 154/66   Pulse 85   Ht 5' 9 (1.753 m)   Wt (!) 145.4 kg (320 lb 9.6 oz)   SpO2 95%   BMI 47.34 kg/m   Wt Readings from Last 3 Encounters:  02/16/24 (!) 145.4 kg (320 lb 9.6 oz)  02/03/24 (!) 145.6 kg (321 lb)  01/25/24 (!)  141.1 kg (311 lb)   PHYSICAL EXAM: General: Well appearing. No distress  Cardiac: JVP flat. No murmurs  Abdomen: Soft, non-distended. Obese Extremities: Warm and dry.  No edema.  Neuro: A&O x3. Affect pleasant.   ECG (personally reviewed): AFL with variable AV block 78 bpm  ASSESSMENT & PLAN:  1. Chronic HFmEF - Echo 11/2016 LVEF 55-60%, Grade 2 DD. - EF 7/20 back down to 30%. Suspect due to AF but rate not overly elevated. HTN may also be playing a role. - Symptoms improved after DC-CV on 10/11/18  - Echo 11/20 EF 45-50% - Echo 12/23 EF 40-45%, global LV HK, RV ok. - Echo 1/25: EF 35-40%, RV ok - Echo 4/25: EF 40-45%, normal RV, moderate to severe AI - Has not had cath due to CKD and lack of clear anginal symptoms.  - Echo 4/25: EF 40-45%, RV ok, mod to severe AI - NYHA III, worsened with AF. Volume ok. BMET/BNP today - continue torsemide  40 mg bid + 40 KCL bid.   - continue weekly metolazone  2.5 mg, does take extra PRN for weight gain (take additional 40 KCL with) - continue coreg  12.5 mg bid. - continue hydralazine  100 mg tid + Imdur  60 mg daily. - Off Farxiga  due to AKI and refuses to restart due to cost.  2. PAF / Typical A-flutter, paroxysmal - S/P AFL ablation 5/19. - s/p DC-CV for AFib on 10/14/18 and 05/10/19 and 9/22 - S/p AF ablation 7/23 by Dr. Sharron -  Recurrent AF s/p DC-CV 05/11/23. Amio increased to 200 bid x 2 weeks  - 8 day Zio 3/25: mostly NSR, 2.2% PVC burden - Back in AF at PCP office. AFL on ECG today - He is compliant with CPAP. - continue Eliquis  5 mg bid. No bleeding, has not missed doses - increase amiodarone  to 200 mg bid - free T4 mildly elevated at PCP appointment, will repeat today - not a re-do ablation candidate until weight closer to 250 lbs - will schedule DCCV with Dr. Bensimhon in the next week or two - needs to follow up with EP  3. Hypertension - BP elevated today, has not taken meds yet  4. Morbid obesity - Body mass index is 47.34  kg/m.  - Had lost over 100 pounds. Weight is now back up 20 lbs.  - Referred to PharmD for tirzepatide vs semaglutide (insurance may cover with OSA diagnosis)  5. CKD IV - Baseline SCr ~3.0-3.5. - Follows with Dr. Dennise.   6. OSA on CPAP - Compliant with CPAP.  - No change  7. Iron  deficiency anemia - s/p Venofer   - Followed by Heme/Onc  8. PVCs - 8 day Zio 3/25 showed 2.2 PVC burden - on coreg  and amio - No change  9. AI - TEE 12/23 read as mild AI, AoV leaflets not coapting well. Mod AI by Dr. Cherrie read - Echo 4/25 moderate AI - no near syncope or dizziness  Follow up in 2 weeks post cardioversion with APP  Ghada Abbett, NP  8:59 AM

## 2024-02-17 LAB — T3, FREE: T3, Free: 1.7 pg/mL — ABNORMAL LOW (ref 2.0–4.4)

## 2024-02-19 NOTE — Progress Notes (Signed)
 Called patient with pre-procedure instructions for tomorrow.   Patient informed of:   Time to arrive for procedure (1000). Remain NPO past midnight.  Must have a ride home and a responsible adult to remain with them for 24 hours post procedure. Instructed to take am meds with sip of water  and confirmed blood thinner consistency.  Patient reports understanding and no other questions at this time.

## 2024-02-22 ENCOUNTER — Encounter (HOSPITAL_COMMUNITY): Payer: Self-pay | Admitting: Internal Medicine

## 2024-02-22 ENCOUNTER — Ambulatory Visit (HOSPITAL_COMMUNITY)
Admission: RE | Admit: 2024-02-22 | Discharge: 2024-02-22 | Disposition: A | Discharge status: Home / Self Care | Attending: Internal Medicine | Admitting: Internal Medicine

## 2024-02-22 ENCOUNTER — Ambulatory Visit (HOSPITAL_COMMUNITY): Admitting: Anesthesiology

## 2024-02-22 ENCOUNTER — Other Ambulatory Visit: Payer: Self-pay

## 2024-02-22 ENCOUNTER — Encounter (HOSPITAL_COMMUNITY)
Admission: RE | Disposition: A | Discharge status: Home / Self Care | Payer: Self-pay | Source: Home / Self Care | Attending: Internal Medicine

## 2024-02-22 DIAGNOSIS — I4892 Unspecified atrial flutter: Secondary | ICD-10-CM

## 2024-02-22 DIAGNOSIS — I5042 Chronic combined systolic (congestive) and diastolic (congestive) heart failure: Secondary | ICD-10-CM | POA: Insufficient documentation

## 2024-02-22 DIAGNOSIS — N184 Chronic kidney disease, stage 4 (severe): Secondary | ICD-10-CM | POA: Insufficient documentation

## 2024-02-22 DIAGNOSIS — Z6841 Body Mass Index (BMI) 40.0 and over, adult: Secondary | ICD-10-CM | POA: Insufficient documentation

## 2024-02-22 DIAGNOSIS — I483 Typical atrial flutter: Secondary | ICD-10-CM | POA: Insufficient documentation

## 2024-02-22 DIAGNOSIS — J4489 Other specified chronic obstructive pulmonary disease: Secondary | ICD-10-CM | POA: Insufficient documentation

## 2024-02-22 DIAGNOSIS — I5022 Chronic systolic (congestive) heart failure: Secondary | ICD-10-CM

## 2024-02-22 DIAGNOSIS — I493 Ventricular premature depolarization: Secondary | ICD-10-CM | POA: Insufficient documentation

## 2024-02-22 DIAGNOSIS — Z86718 Personal history of other venous thrombosis and embolism: Secondary | ICD-10-CM | POA: Insufficient documentation

## 2024-02-22 DIAGNOSIS — I5032 Chronic diastolic (congestive) heart failure: Secondary | ICD-10-CM | POA: Diagnosis not present

## 2024-02-22 DIAGNOSIS — I351 Nonrheumatic aortic (valve) insufficiency: Secondary | ICD-10-CM | POA: Insufficient documentation

## 2024-02-22 DIAGNOSIS — Z79899 Other long term (current) drug therapy: Secondary | ICD-10-CM | POA: Insufficient documentation

## 2024-02-22 DIAGNOSIS — N185 Chronic kidney disease, stage 5: Secondary | ICD-10-CM | POA: Diagnosis not present

## 2024-02-22 DIAGNOSIS — I480 Paroxysmal atrial fibrillation: Secondary | ICD-10-CM | POA: Insufficient documentation

## 2024-02-22 DIAGNOSIS — E66813 Obesity, class 3: Secondary | ICD-10-CM | POA: Insufficient documentation

## 2024-02-22 DIAGNOSIS — I132 Hypertensive heart and chronic kidney disease with heart failure and with stage 5 chronic kidney disease, or end stage renal disease: Secondary | ICD-10-CM | POA: Diagnosis not present

## 2024-02-22 DIAGNOSIS — I130 Hypertensive heart and chronic kidney disease with heart failure and stage 1 through stage 4 chronic kidney disease, or unspecified chronic kidney disease: Secondary | ICD-10-CM | POA: Insufficient documentation

## 2024-02-22 DIAGNOSIS — D509 Iron deficiency anemia, unspecified: Secondary | ICD-10-CM | POA: Insufficient documentation

## 2024-02-22 DIAGNOSIS — Z7901 Long term (current) use of anticoagulants: Secondary | ICD-10-CM | POA: Insufficient documentation

## 2024-02-22 DIAGNOSIS — Z87891 Personal history of nicotine dependence: Secondary | ICD-10-CM | POA: Insufficient documentation

## 2024-02-22 DIAGNOSIS — G4733 Obstructive sleep apnea (adult) (pediatric): Secondary | ICD-10-CM | POA: Insufficient documentation

## 2024-02-22 HISTORY — PX: CARDIOVERSION: EP1203

## 2024-02-22 SURGERY — CARDIOVERSION (CATH LAB)
Anesthesia: Monitor Anesthesia Care

## 2024-02-22 MED ORDER — PROPOFOL 10 MG/ML IV BOLUS
INTRAVENOUS | Status: DC | PRN
  Administered 2024-02-22: 80 mg via INTRAVENOUS

## 2024-02-22 MED ORDER — SODIUM CHLORIDE 0.9 % IV SOLN: INTRAVENOUS | Status: DC

## 2024-02-22 MED ORDER — LIDOCAINE 2% (20 MG/ML) 5 ML SYRINGE
INTRAMUSCULAR | Status: DC | PRN
  Administered 2024-02-22: 80 mg via INTRAVENOUS

## 2024-02-22 MED ORDER — HYDRALAZINE HCL 20 MG/ML IJ SOLN: 10.0000 mg | Freq: Once | INTRAMUSCULAR | Status: DC

## 2024-02-22 SURGICAL SUPPLY — 1 items: PAD DEFIB RADIO PHYSIO CONN (PAD) ×1 IMPLANT

## 2024-02-22 NOTE — Interval H&P Note (Signed)
 History and Physical Interval Note:  02/22/2024 11:06 AM  Phillip [name]  has presented today for surgery, with the diagnosis of aflutter.  The various methods of treatment have been discussed with the patient and family. After consideration of risks, benefits and other options for treatment, the patient has consented to  Procedure(s): CARDIOVERSION (N/A) as a surgical intervention.  The patient's history has been reviewed, patient examined, no change in status, stable for surgery.  I have reviewed the patient's chart and labs.  Questions were answered to the patient's satisfaction.     Zeshan Sena

## 2024-02-22 NOTE — Discharge Instructions (Signed)

## 2024-02-22 NOTE — Transfer of Care (Signed)
 Immediate Anesthesia Transfer of Care Note  Patient: Phillip [name]  Procedure(s) Performed: CARDIOVERSION  Patient Location: PACU  Anesthesia Type:General  Level of Consciousness: drowsy  Airway & Oxygen  Therapy: Patient Spontanous Breathing  Post-op Assessment: Report given to RN  Post vital signs: Reviewed and stable  Last Vitals:  Vitals Value Taken Time  BP    Temp    Pulse    Resp    SpO2      Last Pain:  Vitals:   02/22/24 0950  TempSrc: Temporal         Complications: No notable events documented.

## 2024-02-22 NOTE — Anesthesia Preprocedure Evaluation (Addendum)
 Anesthesia Evaluation  Patient identified by MRN, date of birth, ID band Patient awake    Reviewed: Allergy  & Precautions, NPO status , Patient's Chart, lab work & pertinent test results  Airway Mallampati: II  TM Distance: >3 FB Neck ROM: Full    Dental no notable dental hx. (+) Teeth Intact, Dental Advisory Given   Pulmonary asthma , sleep apnea and Continuous Positive Airway Pressure Ventilation , COPD,  COPD inhaler, former smoker, PE   Pulmonary exam normal breath sounds clear to auscultation       Cardiovascular hypertension, +CHF and + DVT  Normal cardiovascular exam+ dysrhythmias Atrial Fibrillation  Rhythm:Irregular Rate:Normal  TTE 2025 1. Endsystolic left ventricular volume index is 83 ml/m sq. Left  ventricular ejection fraction, by estimation, is 40 to 45%. The left  ventricle has mildly decreased function. The left ventricle demonstrates  global hypokinesis. The left ventricular  internal cavity size was severely dilated. There is mild eccentric left  ventricular hypertrophy. Left ventricular diastolic parameters are  consistent with Grade II diastolic dysfunction (pseudonormalization).  Elevated left atrial pressure.   2. Right ventricular systolic function is normal. The right ventricular  size is normal. Tricuspid regurgitation signal is inadequate for assessing  PA pressure.   3. Left atrial size was severely dilated.   4. Right atrial size was mildly dilated.   5. The mitral valve is normal in structure. Mild mitral valve  regurgitation. No evidence of mitral stenosis.   6. The aortic valve was not well visualized. Aortic valve regurgitation  is moderate to severe. No aortic stenosis is present. Aortic regurgitation  PHT measures 307 msec.   7. The inferior vena cava is normal in size with greater than 50%  respiratory variability, suggesting right atrial pressure of 3 mmHg.     Neuro/Psych negative  neurological ROS  negative psych ROS   GI/Hepatic negative GI ROS, Neg liver ROS,,,  Endo/Other    Class 3 obesity (BMI 47)  Renal/GU Renal InsufficiencyRenal disease  negative genitourinary   Musculoskeletal negative musculoskeletal ROS (+)    Abdominal   Peds  Hematology negative hematology ROS (+)   Anesthesia Other Findings   Reproductive/Obstetrics                              Anesthesia Physical Anesthesia Plan  ASA: 3  Anesthesia Plan: General   Post-op Pain Management:    Induction: Intravenous  PONV Risk Score and Plan: Propofol  infusion and Treatment may vary due to age or medical condition  Airway Management Planned: Natural Airway  Additional Equipment:   Intra-op Plan:   Post-operative Plan:   Informed Consent: I have reviewed the patients History and Physical, chart, labs and discussed the procedure including the risks, benefits and alternatives for the proposed anesthesia with the patient or authorized representative who has indicated his/her understanding and acceptance.     Dental advisory given  Plan Discussed with: CRNA  Anesthesia Plan Comments:          Anesthesia Quick Evaluation

## 2024-02-22 NOTE — CV Procedure (Signed)
    DIRECT CURRENT CARDIOVERSION  NAMEJayse [name]   MRN: [id number] DOB:  1960-08-24   ADMIT DATE: 02/22/2024   INDICATIONS: Atrial flutter   PROCEDURE:   Informed consent was obtained prior to the procedure. The risks, benefits and alternatives for the procedure were discussed and the patient comprehended these risks. Once an appropriate time out was taken, the patient had the defibrillator pads placed in the anterior and posterior position. The patient then underwent sedation by the anesthesia service. Once an appropriate level of sedation was achieved, the patient received a single biphasic, synchronized 200J shock with prompt conversion to sinus rhythm. No apparent complications.  Toribio Fuel, MD  11:12 AM

## 2024-02-23 NOTE — Anesthesia Postprocedure Evaluation (Signed)
 Anesthesia Post Note  Patient: Phillip [name]  Procedure(s) Performed: CARDIOVERSION     Patient location during evaluation: Cath Lab Anesthesia Type: General Level of consciousness: awake and alert Pain management: pain level controlled Vital Signs Assessment: post-procedure vital signs reviewed and stable Respiratory status: spontaneous breathing, nonlabored ventilation, respiratory function stable and patient connected to nasal cannula oxygen  Cardiovascular status: blood pressure returned to baseline and stable Postop Assessment: no apparent nausea or vomiting Anesthetic complications: no   No notable events documented.  Last Vitals:  Vitals:   02/22/24 1129 02/22/24 1139  BP: (!) 123/58 (!) 123/57  Pulse: (!) 54 (!) 56  Resp: 15 15  Temp:    SpO2: 99% 97%    Last Pain:  Vitals:   02/22/24 1139  TempSrc:   PainSc: 0-No pain                 Jalexus Brett L Braeden Dolinski

## 2024-03-04 ENCOUNTER — Ambulatory Visit: Admitting: Family Medicine

## 2024-03-07 ENCOUNTER — Telehealth (HOSPITAL_COMMUNITY): Payer: Self-pay

## 2024-03-07 NOTE — Progress Notes (Signed)
 ADVANCED HF CLINIC NOTE   PCP: Ziglar, Devere POUR, MD EP: Dr. Nancey Nephrology: Dr. Dennise HF Cardiologist: Dr Cherrie   HPI: Phillip [name] is a 63 y.o. male hx of PAF, remote PE, hx of DVT, OSA, morbid obesity, CKD Stage III,  HTN, and  combined chronic systolic/diastolic.   Echo 2015: EF 40-45%  Echo 9/18 with EF 55-60% G2DD, mild aortic regurgitation.  Nuc 2008 no ischemia, no hx of cath due to CKD.    Developed AFL and underwent AFL ablation in 5/19.  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 Underwent attempt at [facility] on 04/22/19 but failed to convert. Seen back in AF Clinic, not able to use Tikosyn due to long QT. Not ablation candidate due to obesity. Considered amiodarone . Started on flecainide  but had treadmill to monitor Flecainide  Tx. Patient had significant QRS widening within first minute of exercise =>flecainide  stopped  Echo 5/21 EF 45-50%  Recurrent AF in 9/22.-> DC-CV, amiodarone  started.   Echo 9/22 EF 45-50% Marked LAE   Admitted 1/23 with a/c CHF and afib RVR. Echo EF 40-45%, global LV HK, RV ok. Diuresed and underwent DCCV.   Had AF ablation by Dr. Viola at [facility] 7/23. Saw Afib clinic 8/23 and in rate controlled AF. Wore 7 day monitor - rhythm was sinus with 14% AF burden.  Admitted 1/25 with a/c HF. Echo 1/25 EF 35-40%, mild LVH, G2DD, RV normal, moderate AI. GDMT titrated and he was discharged home, weight 310 lbs.  Recurrent AF 2/25 -> DC-CV 05/11/23.   8 day Zio 3/25 (on amio 200) showed mostly NSR, 2.2% PVC burden. Echo 4/25 EF 40-45%, normal RV, moderate to severe aortic regurgitation AoV mean gradient 8 mmHg, AVA 3.95 cm   He was seen in the ED recently 10/25 for pharyngitis and treated with Keflex . He returned to ED with productive cough 11/25 and was back in atrial fibrillaiton.  He returns today for heart failure follow up. Overall feeling ok, has been having more  shortness of breath since being back in atrial fibrillation. Feels that he is in an out of it. Denies chest pain, near-syncope, orthopnea, dizziness, and abnormal bleeding. Able to perform ADLs. Appetite okay. Has been making poor food choices recently. Weight at home has gone up, but attributes this to his eating habits. Compliant with all medications.  Manages his weight and volume with PRN metaolazone.   Review of systems complete and found to be negative unless listed in HPI.    Past Medical History:  Diagnosis Date   Asthma    CHF (congestive heart failure) (HCC) 11/15   EF 40-45%, improved with follow-up   Chronic kidney disease    COPD (chronic obstructive pulmonary disease) (HCC)    Deep vein thrombosis (DVT) of lower extremity (HCC) 08/17/2015   DVT (deep venous thrombosis) (HCC)    Gout    Hypertension    Hypertensive cardiovascular disease    Kidney dysfunction    Morbid obesity (HCC)    OSA on CPAP    Paroxysmal atrial fibrillation (HCC)    PNA (pneumonia)    Pulmonary embolism (HCC) 2010   left leg DVT   Sleep apnea    uses cpap   Current Outpatient Medications  Medication Sig Dispense Refill   acetaminophen  (TYLENOL ) 500 MG tablet Take 1,000 mg by mouth every 6 (six) hours as needed for mild pain (pain  score 1-3) or moderate pain (pain score 4-6).     albuterol  (PROAIR  HFA) 108 (90 Base) MCG/ACT inhaler Inhale 2 puffs into the lungs every 4 (four) hours as needed for wheezing or shortness of breath. 1 Inhaler 3   allopurinol  (ZYLOPRIM ) 300 MG tablet TAKE 1 TABLET BY MOUTH EVERY DAY 90 tablet 3   amiodarone  (PACERONE ) 200 MG tablet Take 1 tablet (200 mg total) by mouth daily. 90 tablet 3   apixaban  (ELIQUIS ) 5 MG TABS tablet TAKE 1 TABLET BY MOUTH TWICE A DAY 60 tablet 6   budesonide -formoterol  (SYMBICORT ) 80-4.5 MCG/ACT inhaler Take 2 puffs first thing in am and then another 2 puffs about 12 hours later. 30.6 g 3   calcitRIOL (ROCALTROL) 0.25 MCG capsule Take 0.25 mcg  by mouth daily.     Calcium  Carb-Cholecalciferol  (CALCIUM  600 + D PO) Take 1 tablet by mouth every 3 (three) days.     carvedilol  (COREG ) 6.25 MG tablet TAKE 2 TABLETS (12.5 MG TOTAL) BY MOUTH 2 (TWO) TIMES DAILY WITH A MEAL. 120 tablet 5   Cholecalciferol  (VITAMIN D3) 50 MCG (2000 UT) TABS Take 2,000 Units by mouth daily.     fluticasone  (FLONASE ) 50 MCG/ACT nasal spray Place 2 sprays into both nostrils as needed for allergies or rhinitis.     hydrALAZINE  (APRESOLINE ) 100 MG tablet Take 1 tablet (100 mg total) by mouth every 8 (eight) hours. 90 tablet 0   isosorbide  mononitrate (IMDUR ) 60 MG 24 hr tablet Take 1 tablet (60 mg total) by mouth daily. 90 tablet 3   metolazone  (ZAROXOLYN ) 2.5 MG tablet Take 2.5 mg by mouth once a week.     Misc Natural Products (PUMPKIN SEED OIL) CAPS Take 2 capsules by mouth in the morning.     potassium chloride  SA (KLOR-CON  M) 20 MEQ tablet TAKE 2 TABLETS (40 MEQ) EVERY MORNING AND 1 TABLET (20 MEQ) EVERY EVENING 270 tablet 3   tamsulosin  (FLOMAX ) 0.4 MG CAPS capsule Take 1 capsule (0.4 mg total) by mouth daily. 90 capsule 3   torsemide  (DEMADEX ) 20 MG tablet TAKE 3 TABLETS TWICE A DAY (Patient taking differently: Take 40 mg by mouth 2 (two) times daily.) 540 tablet 3   No current facility-administered medications for this visit.   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    Comment: Disabled  Tobacco Use   Smoking status: Former    Current packs/day: 0.00    Average packs/day: 0.3 packs/day for 1 year (0.3 ttl pk-yrs)    Types: Cigarettes    Start date: 07/23/1988    Quit date: 07/23/1989    Years since quitting: 34.6    Passive exposure: Past    Smokeless tobacco: Never   Tobacco comments:    Former smoker 03/06/2021  Vaping Use   Vaping status: Never Used  Substance and Sexual Activity   Alcohol use: No    Alcohol/week: 0.0 standard drinks of alcohol   Drug use: No   Sexual activity: Not Currently  Other Topics Concern   Not on file  Social History Narrative   Pt lives in [place], alone.  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   Has 2 sons in [place]       No living will   Would want sons to make decisions for him   Would accept resuscitation   Social Drivers of Health   Tobacco Use: Medium Risk (02/16/2024)   Patient History    Smoking Tobacco Use: Former    Smokeless Tobacco Use: Never    Passive Exposure: Past  Physicist, Medical Strain: Not on file  Food Insecurity: No Food Insecurity (04/13/2023)   Hunger Vital Sign    Worried About Running Out of Food in the Last Year: Never true    Ran Out of Food in the Last Year: Never true  Transportation Needs: No Transportation Needs (04/13/2023)   PRAPARE - Administrator, Civil Service (Medical): No    Lack of Transportation (Non-Medical): No  Physical Activity: Not on file  Stress: Not on file  Social Connections: Not on file  Intimate Partner Violence: Not At Risk (04/13/2023)   Humiliation, Afraid, Rape, and Kick questionnaire    Fear of Current or Ex-Partner: No    Emotionally Abused: No    Physically Abused: No    Sexually Abused: No  Depression (PHQ2-9): Medium Risk (02/03/2024)   Depression (PHQ2-9)    PHQ-2 Score: 7  Alcohol Screen: Not on file  Housing: Low Risk (04/13/2023)   Housing Stability Vital Sign    Unable to Pay for Housing in the Last Year: No    Number of Times Moved in the Last Year: 0    Homeless in the Last Year: No  Utilities: Not At Risk (04/13/2023)   AHC Utilities    Threatened with loss of utilities: No  Health Literacy: Not on file    Family History  Problem Relation Age of Onset    Hypertension Mother    Diabetes Father    CAD Father    Clotting disorder Father    Heart disease Father        transplant   Stomach cancer Father    Allergies Brother    Colon cancer Neg Hx    Colon polyps Neg Hx    Esophageal cancer Neg Hx    Rectal cancer Neg Hx    There were no vitals taken for this visit.  Wt Readings from Last 3 Encounters:  02/22/24 (!) 142 kg (313 lb)  02/16/24 (!) 145.4 kg (320 lb 9.6 oz)  02/03/24 (!) 145.6 kg (321 lb)   PHYSICAL EXAM: General: Well appearing. No distress  Cardiac: JVP flat. No murmurs  Abdomen: Soft, non-distended. Obese Extremities: Warm and dry.  No edema.  Neuro: A&O x3. Affect pleasant.   ECG (personally reviewed): AFL with variable AV block 78 bpm  ASSESSMENT & PLAN:  1. Chronic HFmEF - Echo 11/2016 LVEF 55-60%, Grade 2 DD. - EF 7/20 back down to 30%. Suspect due to AF but rate not overly elevated. HTN may also be playing a role. - Symptoms improved after DC-CV on 10/11/18  - Echo 11/20 EF 45-50% - Echo 12/23 EF 40-45%, global LV HK, RV ok. - Echo 1/25: EF 35-40%, RV ok - Echo 4/25: EF 40-45%, normal RV, moderate to severe AI - Has not had cath due to CKD and lack of clear anginal symptoms.  - Echo 4/25: EF 40-45%, RV ok, mod to severe AI - NYHA III, worsened with AF. Volume ok. BMET/BNP today - continue torsemide  40 mg bid +  40 KCL bid.   - continue weekly metolazone  2.5 mg, does take extra PRN for weight gain (take additional 40 KCL with) - continue coreg  12.5 mg bid. - continue hydralazine  100 mg tid + Imdur  60 mg daily. - Off Farxiga  due to AKI and refuses to restart due to cost.  2. PAF / Typical A-flutter, paroxysmal - S/P AFL ablation 5/19. - s/p DC-CV for AFib on 10/14/18 and 05/10/19 and 9/22 - S/p AF ablation 7/23 by Dr. Sharron - Recurrent AF s/p DC-CV 05/11/23. Amio increased to 200 bid x 2 weeks  - 8 day Zio 3/25: mostly NSR, 2.2% PVC burden - Back in AF at PCP office. AFL on ECG today - He is  compliant with CPAP. - continue Eliquis  5 mg bid. No bleeding, has not missed doses - increase amiodarone  to 200 mg bid - free T4 mildly elevated at PCP appointment, will repeat today - not a re-do ablation candidate until weight closer to 250 lbs - will schedule DCCV with Dr. Bensimhon in the next week or two - needs to follow up with EP  3. Hypertension - BP elevated today, has not taken meds yet  4. Morbid obesity - There is no height or weight on file to calculate BMI.  - Had lost over 100 pounds. Weight is now back up 20 lbs.  - Referred to PharmD for tirzepatide vs semaglutide (insurance may cover with OSA diagnosis)  5. CKD IV - Baseline SCr ~3.0-3.5. - Follows with Dr. Dennise.   6. OSA on CPAP - Compliant with CPAP.  - No change  7. Iron  deficiency anemia - s/p Venofer   - Followed by Heme/Onc  8. PVCs - 8 day Zio 3/25 showed 2.2 PVC burden - on coreg  and amio - No change  9. AI - TEE 12/23 read as mild AI, AoV leaflets not coapting well. Mod AI by Dr. Cherrie read - Echo 4/25 moderate AI - no near syncope or dizziness  Follow up in 2 weeks post cardioversion with APP  Harlene CHRISTELLA Gainer, FNP  3:41 PM

## 2024-03-07 NOTE — Telephone Encounter (Signed)
 Called to confirm/remind patient of their appointment at the [facility] on 03/08/24.   Appointment:   [x] Confirmed  [] Left mess   [] No answer/No voice mail  [] VM Full/unable to leave message  [] Phone not in service  Patient reminded to bring all medications and/or complete list.  Confirmed patient has transportation. Gave directions, instructed to utilize valet parking.

## 2024-03-08 ENCOUNTER — Ambulatory Visit (HOSPITAL_COMMUNITY)
Admission: RE | Admit: 2024-03-08 | Discharge: 2024-03-08 | Disposition: A | Source: Ambulatory Visit | Attending: Family Medicine

## 2024-03-08 ENCOUNTER — Encounter (HOSPITAL_COMMUNITY): Payer: Self-pay

## 2024-03-08 VITALS — BP 138/60 | HR 56 | Wt 316.1 lb

## 2024-03-08 DIAGNOSIS — I130 Hypertensive heart and chronic kidney disease with heart failure and stage 1 through stage 4 chronic kidney disease, or unspecified chronic kidney disease: Secondary | ICD-10-CM | POA: Diagnosis present

## 2024-03-08 DIAGNOSIS — I341 Nonrheumatic mitral (valve) prolapse: Secondary | ICD-10-CM | POA: Insufficient documentation

## 2024-03-08 DIAGNOSIS — I10 Essential (primary) hypertension: Secondary | ICD-10-CM

## 2024-03-08 DIAGNOSIS — Z86718 Personal history of other venous thrombosis and embolism: Secondary | ICD-10-CM | POA: Insufficient documentation

## 2024-03-08 DIAGNOSIS — I5042 Chronic combined systolic (congestive) and diastolic (congestive) heart failure: Secondary | ICD-10-CM | POA: Insufficient documentation

## 2024-03-08 DIAGNOSIS — N184 Chronic kidney disease, stage 4 (severe): Secondary | ICD-10-CM

## 2024-03-08 DIAGNOSIS — G4733 Obstructive sleep apnea (adult) (pediatric): Secondary | ICD-10-CM

## 2024-03-08 DIAGNOSIS — I483 Typical atrial flutter: Secondary | ICD-10-CM | POA: Insufficient documentation

## 2024-03-08 DIAGNOSIS — Z7901 Long term (current) use of anticoagulants: Secondary | ICD-10-CM | POA: Diagnosis not present

## 2024-03-08 DIAGNOSIS — D631 Anemia in chronic kidney disease: Secondary | ICD-10-CM | POA: Insufficient documentation

## 2024-03-08 DIAGNOSIS — Z86711 Personal history of pulmonary embolism: Secondary | ICD-10-CM | POA: Insufficient documentation

## 2024-03-08 DIAGNOSIS — I480 Paroxysmal atrial fibrillation: Secondary | ICD-10-CM | POA: Insufficient documentation

## 2024-03-08 DIAGNOSIS — Z6841 Body Mass Index (BMI) 40.0 and over, adult: Secondary | ICD-10-CM | POA: Diagnosis not present

## 2024-03-08 DIAGNOSIS — I493 Ventricular premature depolarization: Secondary | ICD-10-CM | POA: Insufficient documentation

## 2024-03-08 DIAGNOSIS — I5022 Chronic systolic (congestive) heart failure: Secondary | ICD-10-CM

## 2024-03-08 DIAGNOSIS — I351 Nonrheumatic aortic (valve) insufficiency: Secondary | ICD-10-CM

## 2024-03-08 DIAGNOSIS — D509 Iron deficiency anemia, unspecified: Secondary | ICD-10-CM | POA: Insufficient documentation

## 2024-03-08 NOTE — Patient Instructions (Addendum)
 Good  to see you today!   Your physician has requested that you have an echocardiogram. Echocardiography is a painless test that uses sound waves to create images of your heart. It provides your doctor with information about the size and shape of your heart and how well your hearts chambers and valves are working. This procedure takes approximately one hour. There are no restrictions for this procedure. Please do NOT wear cologne, perfume, aftershave, or lotions (deodorant is allowed). Please arrive 15 minutes prior to your appointment time.  Please note: We ask at that you not bring children with you during ultrasound (echo/ vascular) testing. Due to room size and safety concerns, children are not allowed in the ultrasound rooms during exams. Our front office staff cannot provide observation of children in our lobby area while testing is being conducted. An adult accompanying a patient to their appointment will only be allowed in the ultrasound room at the discretion of the ultrasound technician under special circumstances. We apologize for any inconvenience.  Your physician recommends that you schedule a follow-up appointment 4 months with echocardiogram(April) Call office in February to schedule an appointment  If you have any questions or concerns before your next appointment please send us  a message through [place] or call our office at [phone].    TO LEAVE A MESSAGE FOR THE NURSE SELECT OPTION 2, PLEASE LEAVE A MESSAGE INCLUDING: YOUR NAME DATE OF BIRTH CALL BACK NUMBER REASON FOR CALL**this is important as we prioritize the call backs  YOU WILL RECEIVE A CALL BACK THE SAME DAY AS LONG AS YOU CALL BEFORE 4:00 PM At the [facility], you and your health needs are our priority. As part of our continuing mission to provide you with exceptional heart care, we have created designated Provider Care Teams. These Care Teams include your primary Cardiologist (physician) and  Advanced Practice Providers (APPs- Physician Assistants and Nurse Practitioners) who all work together to provide you with the care you need, when you need it.   You may see any of the following providers on your designated Care Team at your next follow up: Dr Toribio Fuel Dr Ezra Shuck Dr. Morene Brownie Greig Mosses, NP Caffie Shed, [place] [facility] [place], [place] Beckey Coe, NP Jordan Lee, NP Ellouise Class, NP Tinnie Redman, PharmD Jaun Bash, PharmD   Please be sure to bring in all your medications bottles to every appointment.    Thank you for choosing [facility]

## 2024-03-09 ENCOUNTER — Ambulatory Visit (HOSPITAL_COMMUNITY): Payer: Self-pay | Admitting: Family Medicine

## 2024-03-14 ENCOUNTER — Inpatient Hospital Stay: Attending: Oncology

## 2024-03-14 ENCOUNTER — Encounter: Payer: Self-pay | Admitting: Oncology

## 2024-03-14 ENCOUNTER — Ambulatory Visit

## 2024-03-14 ENCOUNTER — Ambulatory Visit: Admitting: Oncology

## 2024-03-14 VITALS — BP 127/55 | HR 55 | Temp 98.2°F | Resp 18 | Wt 315.6 lb

## 2024-03-14 DIAGNOSIS — I480 Paroxysmal atrial fibrillation: Secondary | ICD-10-CM | POA: Insufficient documentation

## 2024-03-14 DIAGNOSIS — G4733 Obstructive sleep apnea (adult) (pediatric): Secondary | ICD-10-CM | POA: Diagnosis not present

## 2024-03-14 DIAGNOSIS — Z8249 Family history of ischemic heart disease and other diseases of the circulatory system: Secondary | ICD-10-CM | POA: Insufficient documentation

## 2024-03-14 DIAGNOSIS — Z833 Family history of diabetes mellitus: Secondary | ICD-10-CM | POA: Insufficient documentation

## 2024-03-14 DIAGNOSIS — N184 Chronic kidney disease, stage 4 (severe): Secondary | ICD-10-CM

## 2024-03-14 DIAGNOSIS — Z86718 Personal history of other venous thrombosis and embolism: Secondary | ICD-10-CM | POA: Diagnosis not present

## 2024-03-14 DIAGNOSIS — J4489 Other specified chronic obstructive pulmonary disease: Secondary | ICD-10-CM | POA: Diagnosis not present

## 2024-03-14 DIAGNOSIS — Z86711 Personal history of pulmonary embolism: Secondary | ICD-10-CM | POA: Insufficient documentation

## 2024-03-14 DIAGNOSIS — E538 Deficiency of other specified B group vitamins: Secondary | ICD-10-CM | POA: Diagnosis not present

## 2024-03-14 DIAGNOSIS — Z800 Family history of malignant neoplasm of digestive organs: Secondary | ICD-10-CM | POA: Insufficient documentation

## 2024-03-14 DIAGNOSIS — I5043 Acute on chronic combined systolic (congestive) and diastolic (congestive) heart failure: Secondary | ICD-10-CM | POA: Diagnosis not present

## 2024-03-14 DIAGNOSIS — Z832 Family history of diseases of the blood and blood-forming organs and certain disorders involving the immune mechanism: Secondary | ICD-10-CM | POA: Insufficient documentation

## 2024-03-14 DIAGNOSIS — D631 Anemia in chronic kidney disease: Secondary | ICD-10-CM | POA: Insufficient documentation

## 2024-03-14 DIAGNOSIS — M109 Gout, unspecified: Secondary | ICD-10-CM | POA: Diagnosis not present

## 2024-03-14 DIAGNOSIS — Z8701 Personal history of pneumonia (recurrent): Secondary | ICD-10-CM | POA: Insufficient documentation

## 2024-03-14 DIAGNOSIS — I130 Hypertensive heart and chronic kidney disease with heart failure and stage 1 through stage 4 chronic kidney disease, or unspecified chronic kidney disease: Secondary | ICD-10-CM | POA: Diagnosis not present

## 2024-03-14 DIAGNOSIS — Z79899 Other long term (current) drug therapy: Secondary | ICD-10-CM | POA: Diagnosis not present

## 2024-03-14 DIAGNOSIS — R634 Abnormal weight loss: Secondary | ICD-10-CM | POA: Diagnosis not present

## 2024-03-14 DIAGNOSIS — Z7901 Long term (current) use of anticoagulants: Secondary | ICD-10-CM | POA: Diagnosis not present

## 2024-03-14 DIAGNOSIS — Z87891 Personal history of nicotine dependence: Secondary | ICD-10-CM | POA: Insufficient documentation

## 2024-03-14 LAB — RETIC PANEL
Immature Retic Fract: 5.9 % (ref 2.3–15.9)
RBC.: 3.8 MIL/uL — ABNORMAL LOW (ref 4.22–5.81)
Retic Count, Absolute: 41 K/uL (ref 19.0–186.0)
Retic Ct Pct: 1.1 % (ref 0.4–3.1)
Reticulocyte Hemoglobin: 30.4 pg

## 2024-03-14 LAB — CBC WITH DIFFERENTIAL (CANCER CENTER ONLY)
Abs Immature Granulocytes: 0.01 K/uL (ref 0.00–0.07)
Basophils Absolute: 0 K/uL (ref 0.0–0.1)
Basophils Relative: 1 %
Eosinophils Absolute: 0 K/uL (ref 0.0–0.5)
Eosinophils Relative: 1 %
HCT: 36 % — ABNORMAL LOW (ref 39.0–52.0)
Hemoglobin: 11.4 g/dL — ABNORMAL LOW (ref 13.0–17.0)
Immature Granulocytes: 0 %
Lymphocytes Relative: 30 %
Lymphs Abs: 1 K/uL (ref 0.7–4.0)
MCH: 29.8 pg (ref 26.0–34.0)
MCHC: 31.7 g/dL (ref 30.0–36.0)
MCV: 94.2 fL (ref 80.0–100.0)
Monocytes Absolute: 0.4 K/uL (ref 0.1–1.0)
Monocytes Relative: 12 %
Neutro Abs: 1.8 K/uL (ref 1.7–7.7)
Neutrophils Relative %: 56 %
Platelet Count: 110 K/uL — ABNORMAL LOW (ref 150–400)
RBC: 3.82 MIL/uL — ABNORMAL LOW (ref 4.22–5.81)
RDW: 15.3 % (ref 11.5–15.5)
WBC Count: 3.2 K/uL — ABNORMAL LOW (ref 4.0–10.5)
nRBC: 0 % (ref 0.0–0.2)

## 2024-03-14 LAB — IRON AND TIBC
Iron: 70 ug/dL (ref 45–182)
Saturation Ratios: 27 % (ref 17.9–39.5)
TIBC: 262 ug/dL (ref 250–450)
UIBC: 192 ug/dL

## 2024-03-14 LAB — VITAMIN B12: Vitamin B-12: 979 pg/mL — ABNORMAL HIGH (ref 180–914)

## 2024-03-14 LAB — FERRITIN: Ferritin: 466 ng/mL — ABNORMAL HIGH (ref 24–336)

## 2024-03-14 NOTE — Progress Notes (Signed)
 " Hematology/Oncology Progress note [phone] [phone]  CHIEF COMPLAINTS/REASON FOR VISIT:  anemia of chronic kidney disease, B12 deficiency.  ASSESSMENT & PLAN:   Anemia of chronic renal failure Anemia, likely secondary to chronic kidney disease Labs reviewed and discussed with patient. Lab Results  Component Value Date   HGB 11.4 (L) 03/14/2024   TIBC 262 03/14/2024   IRONPCTSAT 27 03/14/2024   FERRITIN 466 (H) 03/14/2024   Hold off IV Venofer . Stable hemoglobin and iron  panel.    B12 deficiency B12 level has improved.   I will hold off additional B12 injections.    Chronic kidney disease, stage IV (severe) (HCC) Recommend patient to follow up with nephrology  Orders Placed This Encounter  Procedures   CBC with Differential (Cancer Center Only)    Standing Status:   Future    Expected Date:   07/13/2024    Expiration Date:   10/11/2024   Iron  and TIBC    Standing Status:   Future    Expected Date:   07/13/2024    Expiration Date:   10/11/2024   Ferritin    Standing Status:   Future    Expected Date:   07/13/2024    Expiration Date:   10/11/2024   Retic Panel    Standing Status:   Future    Expected Date:   07/13/2024    Expiration Date:   10/11/2024   Vitamin B12    Standing Status:   Future    Expected Date:   07/13/2024    Expiration Date:   10/11/2024   Follow-up 6 months. All questions were answered. The patient knows to call the clinic with any problems, questions or concerns.  Zelphia Cap, MD, PhD [facility] Hematology Oncology 03/14/2024   HISTORY OF PRESENTING ILLNESS:   Phillip [name] is a  63 y.o.  male with PMH listed below was seen in consultation at the request of  Jimmy Ade I, MD  for evaluation of anemia of chronic kidney disease.  Patient has chronic kidney disease and follows up with Dr. Dennise.  Baseline creatinine 2.9. History of pulmonary embolism [2010], left lower extremity DVT[2017], atrial fibrillation, currently  on Xarelto  20 mg daily.  Managed by cardiology/CHF clinic Dr.Bensimhon.  Patient denies any melena, hematochezia.  07/16/2021 CBC showed hemoglobin 10.4, MCV 91, platelet count 10 9000, white count 2.6. Patient denies alcohol use.  He has intentionally lost weight. Patient was referred to establish care with hematology for further evaluation.  INTERVAL HISTORY Phillip [name] is a 63 y.o. male who has above history reviewed by me today presents for follow up visit for management of anemia due to chronic kidney disease, vitamin B12 deficiency. Patient reports feeling well. He is on Eliquis  5mg  BID for A-fib.  Follows up with [facility] cardiology. Chronic kidney disease.  He has not seen nephrology since last visit in 2023 No other new complaints.    Review of Systems  Constitutional:  Negative for appetite change, chills, fatigue, fever and unexpected weight change.  HENT:   Negative for hearing loss and voice change.   Eyes:  Negative for eye problems and icterus.  Respiratory:  Negative for chest tightness, cough and shortness of breath.   Cardiovascular:  Negative for chest pain and leg swelling.  Gastrointestinal:  Negative for abdominal distention and abdominal pain.  Endocrine: Negative for hot flashes.  Genitourinary:  Negative for difficulty urinating, dysuria and frequency.   Musculoskeletal:  Negative for arthralgias.  Skin:  Negative for  itching and rash.  Neurological:  Negative for light-headedness and numbness.  Hematological:  Negative for adenopathy. Does not bruise/bleed easily.  Psychiatric/Behavioral:  Negative for confusion.     MEDICAL HISTORY:  Past Medical History:  Diagnosis Date   Asthma    CHF (congestive heart failure) (HCC) 11/15   EF 40-45%, improved with follow-up   Chronic kidney disease    COPD (chronic obstructive pulmonary disease) (HCC)    Deep vein thrombosis (DVT) of lower extremity (HCC) 08/17/2015   DVT (deep venous thrombosis) (HCC)    Gout     Hypertension    Hypertensive cardiovascular disease    Kidney dysfunction    Morbid obesity (HCC)    OSA on CPAP    Paroxysmal atrial fibrillation (HCC)    PNA (pneumonia)    Pulmonary embolism (HCC) 2010   left leg DVT   Sleep apnea    uses cpap    SURGICAL HISTORY: Past Surgical History:  Procedure Laterality Date   A-FLUTTER ABLATION N/A 07/22/2017   Procedure: A-FLUTTER ABLATION;  Surgeon: Kelsie Agent, MD;  Location: [facility] INVASIVE CV LAB;  Service: Cardiovascular;  Laterality: N/A;   CARDIOVERSION N/A 10/14/2018   Procedure: CARDIOVERSION;  Surgeon: Cherrie Toribio SAUNDERS, MD;  Location: [facility] ENDOSCOPY;  Service: Cardiovascular;  Laterality: N/A;   CARDIOVERSION N/A 04/22/2019   Procedure: CARDIOVERSION;  Surgeon: Alveta Aleene PARAS, MD;  Location: [facility] ENDOSCOPY;  Service: Cardiovascular;  Laterality: N/A;   CARDIOVERSION N/A 05/10/2019   Procedure: CARDIOVERSION;  Surgeon: Cherrie Toribio SAUNDERS, MD;  Location: [facility] ENDOSCOPY;  Service: Cardiovascular;  Laterality: N/A;   CARDIOVERSION N/A 11/23/2020   Procedure: CARDIOVERSION;  Surgeon: Mona Vinie BROCKS, MD;  Location: [facility] ENDOSCOPY;  Service: Cardiovascular;  Laterality: N/A;   CARDIOVERSION N/A 04/16/2021   Procedure: CARDIOVERSION;  Surgeon: Sheena Pugh, DO;  Location: [facility] ENDOSCOPY;  Service: Cardiovascular;  Laterality: N/A;   CARDIOVERSION N/A 05/11/2023   Procedure: CARDIOVERSION;  Surgeon: Cherrie Toribio SAUNDERS, MD;  Location: [facility] INVASIVE CV LAB;  Service: Cardiovascular;  Laterality: N/A;   CARDIOVERSION N/A 02/22/2024   Procedure: CARDIOVERSION;  Surgeon: Cherrie Toribio SAUNDERS, MD;  Location: [facility] INVASIVE CV LAB;  Service: Cardiovascular;  Laterality: N/A;   COLONOSCOPY WITH PROPOFOL  N/A 12/20/2019   Procedure: COLONOSCOPY WITH PROPOFOL ;  Surgeon: Leigh Elspeth SQUIBB, MD;  Location: [facility] ENDOSCOPY;  Service: Gastroenterology;  Laterality: N/A;   POLYPECTOMY  12/20/2019   Procedure: POLYPECTOMY;  Surgeon: Leigh Elspeth SQUIBB, MD;  Location: [facility]  ENDOSCOPY;  Service: Gastroenterology;;   TEE WITHOUT CARDIOVERSION N/A 03/05/2022   Procedure: TRANSESOPHAGEAL ECHOCARDIOGRAM (TEE);  Surgeon: Alvan Ronal BRAVO, MD;  Location: [facility] ENDOSCOPY;  Service: Cardiovascular;  Laterality: N/A;   UMBILICAL HERNIA REPAIR  1992    SOCIAL HISTORY: Social History   Socioeconomic History   Marital status: Divorced    Spouse name: Not on file   Number of children: 2   Years of education: 12   Highest education level: Not on file  Occupational History   Occupation: Drives dump truck    Comment:     Occupation: It Sales Professional    Comment: Disabled  Tobacco Use   Smoking status: Former    Current packs/day: 0.00    Average packs/day: 0.3 packs/day for 1 year (0.3 ttl pk-yrs)    Types: Cigarettes    Start date: 07/23/1988    Quit date: 07/23/1989    Years since quitting: 34.6    Passive exposure: Past   Smokeless tobacco: Never   Tobacco comments:  Former smoker 03/06/2021  Vaping Use   Vaping status: Never Used  Substance and Sexual Activity   Alcohol use: No    Alcohol/week: 0.0 standard drinks of alcohol   Drug use: No   Sexual activity: Not Currently  Other Topics Concern   Not on file  Social History Narrative   Pt lives in [place], alone.   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   Has 2 sons in [place]       No living will   Would want sons to make decisions for him   Would accept resuscitation   Social Drivers of Health   Tobacco Use: Medium Risk (03/14/2024)   Patient History    Smoking Tobacco Use: Former    Smokeless Tobacco Use: Never    Passive Exposure: Past  Physicist, Medical Strain: Not on file  Food Insecurity: No Food Insecurity (04/13/2023)   Hunger Vital Sign    Worried About Running Out of Food in the Last Year: Never true    Ran Out of Food in the Last Year: Never true  Transportation Needs: No Transportation Needs (04/13/2023)   PRAPARE - Scientist, Research (physical Sciences) (Medical): No    Lack of Transportation (Non-Medical): No  Physical Activity: Not on file  Stress: Not on file  Social Connections: Not on file  Intimate Partner Violence: Not At Risk (04/13/2023)   Humiliation, Afraid, Rape, and Kick questionnaire    Fear of Current or Ex-Partner: No    Emotionally Abused: No    Physically Abused: No    Sexually Abused: No  Depression (PHQ2-9): Medium Risk (02/03/2024)   Depression (PHQ2-9)    PHQ-2 Score: 7  Alcohol Screen: Not on file  Housing: Low Risk (04/13/2023)   Housing Stability Vital Sign    Unable to Pay for Housing in the Last Year: No    Number of Times Moved in the Last Year: 0    Homeless in the Last Year: No  Utilities: Not At Risk (04/13/2023)   AHC Utilities    Threatened with loss of utilities: No  Health Literacy: Not on file    FAMILY HISTORY: Family History  Problem Relation Age of Onset   Hypertension Mother    Diabetes Father    CAD Father    Clotting disorder Father    Heart disease Father        transplant   Stomach cancer Father    Allergies Brother    Colon cancer Neg Hx    Colon polyps Neg Hx    Esophageal cancer Neg Hx    Rectal cancer Neg Hx     ALLERGIES:  is allergic to other.  MEDICATIONS:  Current Outpatient Medications  Medication Sig Dispense Refill   acetaminophen  (TYLENOL ) 500 MG tablet Take 1,000 mg by mouth every 6 (six) hours as needed for mild pain (pain score 1-3) or moderate pain (pain score 4-6).     albuterol  (PROAIR  HFA) 108 (90 Base) MCG/ACT inhaler Inhale 2 puffs into the lungs every 4 (four) hours as needed for wheezing or shortness of breath. 1 Inhaler 3   allopurinol  (ZYLOPRIM ) 300 MG tablet TAKE 1 TABLET BY MOUTH EVERY DAY 90 tablet 3   amiodarone  (PACERONE ) 200 MG tablet Take 1 tablet (200 mg total) by mouth daily. 90 tablet 3   apixaban  (ELIQUIS ) 5 MG TABS tablet TAKE 1 TABLET BY MOUTH TWICE A DAY 60 tablet 6   budesonide -formoterol  (SYMBICORT )  80-4.5 MCG/ACT  inhaler Take 2 puffs first thing in am and then another 2 puffs about 12 hours later. 30.6 g 3   Calcium  Carb-Cholecalciferol  (CALCIUM  600 + D PO) Take 1 tablet by mouth every 3 (three) days.     carvedilol  (COREG ) 6.25 MG tablet TAKE 2 TABLETS (12.5 MG TOTAL) BY MOUTH 2 (TWO) TIMES DAILY WITH A MEAL. 120 tablet 5   Cholecalciferol  (VITAMIN D3) 50 MCG (2000 UT) TABS Take 2,000 Units by mouth daily.     fluticasone  (FLONASE ) 50 MCG/ACT nasal spray Place 2 sprays into both nostrils as needed for allergies or rhinitis.     hydrALAZINE  (APRESOLINE ) 100 MG tablet Take 1 tablet (100 mg total) by mouth every 8 (eight) hours. 90 tablet 0   isosorbide  mononitrate (IMDUR ) 60 MG 24 hr tablet Take 1 tablet (60 mg total) by mouth daily. 90 tablet 3   metolazone  (ZAROXOLYN ) 2.5 MG tablet Take 2.5 mg by mouth once a week.     Misc Natural Products (PUMPKIN SEED OIL) CAPS Take 2 capsules by mouth in the morning.     potassium chloride  SA (KLOR-CON  M) 20 MEQ tablet TAKE 2 TABLETS (40 MEQ) EVERY MORNING AND 1 TABLET (20 MEQ) EVERY EVENING 270 tablet 3   tamsulosin  (FLOMAX ) 0.4 MG CAPS capsule Take 1 capsule (0.4 mg total) by mouth daily. 90 capsule 3   torsemide  (DEMADEX ) 20 MG tablet Take 40 mg by mouth 2 (two) times daily.     calcitRIOL (ROCALTROL) 0.25 MCG capsule Take 0.25 mcg by mouth daily.     No current facility-administered medications for this visit.     PHYSICAL EXAMINATION: ECOG PERFORMANCE STATUS: 1 - Symptomatic but completely ambulatory Vitals:   03/14/24 1436  BP: (!) 127/55  Pulse: (!) 55  Resp: 18  Temp: 98.2 F (36.8 C)   Filed Weights   03/14/24 1436  Weight: (!) 315 lb 9.6 oz (143.2 kg)    Physical Exam Constitutional:      General: He is not in acute distress.    Appearance: He is obese.  HENT:     Head: Normocephalic and atraumatic.  Eyes:     General: No scleral icterus. Cardiovascular:     Rate and Rhythm: Normal rate and regular rhythm.     Heart sounds: Normal  heart sounds.  Pulmonary:     Effort: Pulmonary effort is normal. No respiratory distress.     Breath sounds: No wheezing.  Abdominal:     General: Bowel sounds are normal. There is no distension.     Palpations: Abdomen is soft.  Musculoskeletal:        General: No deformity. Normal range of motion.     Cervical back: Normal range of motion and neck supple.  Skin:    General: Skin is warm and dry.     Findings: No erythema or rash.  Neurological:     Mental Status: He is alert and oriented to person, place, and time. Mental status is at baseline.     Cranial Nerves: No cranial nerve deficit.  Psychiatric:        Mood and Affect: Mood normal.     LABORATORY DATA:  I have reviewed the data as listed    Latest Ref Rng & Units 03/14/2024   11:24 AM 02/16/2024   10:09 AM 01/25/2024    1:13 AM  CBC  WBC 4.0 - 10.5 K/uL 3.2  3.0  9.1   Hemoglobin 13.0 - 17.0 g/dL 11.4  11.6  10.5   Hematocrit 39.0 - 52.0 % 36.0  37.4  32.7   Platelets 150 - 400 K/uL 110  108  93     Recent Labs    04/13/23 0727 04/14/23 0358 09/26/23 2318 10/08/23 1003 01/25/24 0113 02/03/24 0918 02/16/24 1009  NA 142   < > 139 142 142 142 143  K 4.0   < > 3.9 4.3 4.3 3.9 3.9  CL 106   < > 104 102 105 99 102  CO2 29   < > 26 23 27 27  32  GLUCOSE 83   < > 97 82 145* 83 82  BUN 45*   < > 66* 44* 57* 68* 62*  CREATININE 2.92*   < > 3.73* 3.51* 3.87* 3.94* 4.01*  CALCIUM  8.9   < > 9.0 9.3 9.8 8.9 9.3  GFRNONAA 24*   < > 18*  --  17*  --  16*  PROT 6.7  --   --  6.4  --  5.8*  --   ALBUMIN  3.8  --   --  4.1  --  3.8*  --   AST 27  --   --  17  --  20  --   ALT 24  --   --  12  --  15  --   ALKPHOS 92  --   --  97  --  64  --   BILITOT 0.9  --   --  0.7  --  0.5  --   BILIDIR 0.3*  --   --   --   --   --   --   IBILI 0.6  --   --   --   --   --   --    < > = values in this interval not displayed.   Iron /TIBC/Ferritin/ %Sat    Component Value Date/Time   IRON  70 03/14/2024 1124   IRON  74  03/23/2017 1133   TIBC 262 03/14/2024 1124   TIBC 334 03/23/2017 1133   FERRITIN 466 (H) 03/14/2024 1124   IRONPCTSAT 27 03/14/2024 1124   IRONPCTSAT 22 03/23/2017 1133      RADIOGRAPHIC STUDIES: I have personally reviewed the radiological images as listed and agreed with the findings in the report. EP STUDY Result Date: 02/22/2024 See surgical note for result.   "

## 2024-03-14 NOTE — Assessment & Plan Note (Signed)
 Recommend patient to follow up with nephrology

## 2024-03-14 NOTE — Assessment & Plan Note (Signed)
 B12 level has improved.   I will hold off additional B12 injections.

## 2024-03-14 NOTE — Assessment & Plan Note (Addendum)
 Anemia, likely secondary to chronic kidney disease Labs reviewed and discussed with patient. Lab Results  Component Value Date   HGB 11.4 (L) 03/14/2024   TIBC 262 03/14/2024   IRONPCTSAT 27 03/14/2024   FERRITIN 466 (H) 03/14/2024   Hold off IV Venofer . Stable hemoglobin and iron  panel.

## 2024-03-28 ENCOUNTER — Other Ambulatory Visit (HOSPITAL_COMMUNITY): Payer: Self-pay | Admitting: Physician Assistant

## 2024-03-30 NOTE — Progress Notes (Signed)
 " Electrophysiology Office Note:    Date:  03/31/2024   ID:  Phillip [name], DOB 1960-06-23, MRN [id number]  PCP:  Ziglar, Susan K, MD   [facility] Providers Cardiologist:  Toribio Fuel, MD Electrophysiologist:  Eulas FORBES Furbish, MD  Advanced Heart Failure:  Toribio Fuel, MD     Referring MD: Ziglar, Susan K, MD   History of Present Illness:    Phillip [name] is a 64 y.o. male with a hx listed below, significant for paroxysmal atrial fibrillation and flutter, CHFrEF, COPD, morbid obesity referred for arrhythmia management.  He underwent ablation of atrial flutter by Dr. Kelsie in May 2019. He has had persistent atrial fibrillation. In Feb 2021, cardioversion was attempted but failed. He was maintained on amiodarone  for a while but had recurrence and underwent an ablation by Dr. Sharron at [facility] in July, 2023. In follow-up in our AF clinic the following month, he was back in AF. The ablation was complicated by a large hematoma. He continued amiodarone  and was referred for cardioversion. He was noted to be in sinus in his most recent follow-up in December.  He has had recurrent atrial fibrillation and underwent another cardioversion in December 2025.  This most recent recurrence of atrial fibrillation and atypical flutter occurred after a few months of weight gain and dietary indiscretion.  He notes that his mom had come down to visit him and fixed a lot of food.him   He is religious about wearing CPAP.      EKGs/Labs/Other Studies Reviewed Today:     EKG:  EKG Interpretation Date/Time:  Thursday March 31 2024 08:30:11 EST Ventricular Rate:  66 PR Interval:  194 QRS Duration:  112 QT Interval:  476 QTC Calculation: 499 R Axis:   -33  Text Interpretation: Sinus rhythm Left axis deviation Pulmonary disease pattern Left ventricular hypertrophy with repolarization abnormality ( R in aVL , Cornell product ) Prolonged QT When compared with ECG of 08-Mar-2024 13:40,  No significant change was found Confirmed by Furbish Eulas [phone]) on 03/31/2024 8:41:06 AM     Recent Labs: 01/25/2024: Magnesium 2.1; Pro Brain Natriuretic Peptide 15,808.0 02/03/2024: ALT 15 02/16/2024: B Natriuretic Peptide 638.0; BUN 62; Creatinine, Ser 4.01; Potassium 3.9; Sodium 143; TSH 2.480 03/14/2024: Hemoglobin 11.4; Platelet Count 110   TTE: April 2025 LVEF 40 to 45%.  Severely dilated LV.  Grade 2 diastolic dysfunction  Physical Exam:    VS:  BP 112/60   Pulse 66   Ht 5' 9 (1.753 m)   Wt (!) 313 lb (142 kg)   SpO2 94%   BMI 46.22 kg/m     Wt Readings from Last 3 Encounters:  03/31/24 (!) 313 lb (142 kg)  03/14/24 (!) 315 lb 9.6 oz (143.2 kg)  03/08/24 (!) 316 lb 2 oz (143.4 kg)     GEN: Morbidly obese -- Well nourished, well developed in no acute distress CARDIAC: RRR, no murmurs, rubs, gallops RESPIRATORY:  Normal work of breathing MUSCULOSKELETAL: no edema    ASSESSMENT & PLAN:    Persistent atrial fibrillation:  Has had recurrence of AF despite amiodarone  There is quite a long interval between cardioversions, and I suspect this most recent episode was triggered by weight gain and dietary indiscretion. Has had 2 AF ablations with recurrence of AF shortly after his last ablation, which was complicated by a hematoma There is diminishing returns on further ablation and increased risk due to his BMI We again discussed the importance of weight loss management  Paroxysmal atrial flutter: s/p ablation by Dr. Kelsie in 2019 for typical atrial flutter Atypical atrial flutter 02/16/2024  Acute on chronic CHF: continue follow-up in CHF clinic        Medication Adjustments/Labs and Tests Ordered: Current medicines are reviewed at length with the patient today.  Concerns regarding medicines are outlined above.  Orders Placed This Encounter  Procedures   EKG 12-Lead   No orders of the defined types were placed in this encounter.    Signed, Eulas FORBES Furbish, MD  03/31/2024 8:46 AM    [facility] "

## 2024-03-31 ENCOUNTER — Encounter: Payer: Self-pay | Admitting: Cardiovascular Disease

## 2024-03-31 ENCOUNTER — Ambulatory Visit: Attending: Cardiovascular Disease | Admitting: Cardiovascular Disease

## 2024-03-31 VITALS — BP 112/60 | HR 66 | Ht 69.0 in | Wt 313.0 lb

## 2024-03-31 DIAGNOSIS — I480 Paroxysmal atrial fibrillation: Secondary | ICD-10-CM | POA: Diagnosis not present

## 2024-03-31 NOTE — Patient Instructions (Signed)

## 2024-04-07 ENCOUNTER — Ambulatory Visit (HOSPITAL_COMMUNITY)
Admission: RE | Admit: 2024-04-07 | Discharge: 2024-04-07 | Disposition: A | Discharge status: Home / Self Care | Source: Ambulatory Visit | Attending: Physician Assistant | Admitting: Physician Assistant

## 2024-04-07 VITALS — BP 120/54 | HR 64 | Ht 69.0 in | Wt 312.9 lb

## 2024-04-07 DIAGNOSIS — Z5181 Encounter for therapeutic drug level monitoring: Secondary | ICD-10-CM | POA: Diagnosis not present

## 2024-04-07 DIAGNOSIS — D6869 Other thrombophilia: Secondary | ICD-10-CM | POA: Diagnosis not present

## 2024-04-07 DIAGNOSIS — I4819 Other persistent atrial fibrillation: Secondary | ICD-10-CM

## 2024-04-07 DIAGNOSIS — Z79899 Other long term (current) drug therapy: Secondary | ICD-10-CM

## 2024-04-07 DIAGNOSIS — I4891 Unspecified atrial fibrillation: Secondary | ICD-10-CM

## 2024-04-07 NOTE — Progress Notes (Signed)
 "   Primary Care Physician: Ziglar, Devere POUR, MD Primary Cardiologist: Toribio Fuel, MD Electrophysiologist: Eulas FORBES Furbish, MD  Referring Physician: Dr Bensimhon    Armonte Thilges is a 64 y.o. male with a history of CHFrEF, COPD, HTN, CKD, OSA, morbid obesity, atrial flutter, atrial fibrillation who presents for follow up in the [facility]. He underwent ablation of atrial flutter by Dr. Kelsie in May 2019. He has had persistent atrial fibrillation. In Feb 2021, cardioversion was attempted but failed. He was maintained on amiodarone  for a while but had recurrence and underwent an ablation by Dr. Viola at [facility] in July, 2023. In follow-up in our AF clinic the following month, he was back in AF. The ablation was complicated by a large hematoma. He was seen by Dr Furbish 03/31/22, no changes at that time. Patient is on Eliquis  for stroke prevention. He did require DCCV on 05/11/23.   Patient returns for follow up for atrial fibrillation and amiodarone  monitoring. He remains in SR today and feels well. He does have occasional palpitations lasting only a few seconds. No sustained palpitations. No bleeding issues on anticoagulation.   Today, he  denies symptoms of chest pain, shortness of breath, orthopnea, PND, lower extremity edema, dizziness, presyncope, syncope, bleeding, or neurologic sequela. The patient is tolerating medications without difficulties and is otherwise without complaint today.    Atrial Fibrillation Risk Factors:  he does have symptoms or diagnosis of sleep apnea. he does not have a history of rheumatic fever.   Atrial Fibrillation Management history:  Previous antiarrhythmic drugs: amiodarone   Previous cardioversions: 10/14/18, 04/22/19, 05/10/19, 11/23/20, 04/16/21, 05/11/23 Previous ablations: 07/22/17 (flutter), 09/2021 [facility])  Anticoagulation history: Xarelto , Eliquis   ROS- All systems are reviewed and negative except as per the HPI  above.   Physical Exam: BP (!) 120/54   Pulse 64   Ht 5' 9 (1.753 m)   Wt (!) 141.9 kg   BMI 46.21 kg/m   GEN: Well nourished, well developed in no acute distress CARDIAC: Regular rate and rhythm, no murmurs, rubs, gallops RESPIRATORY:  Clear to auscultation without rales, wheezing or rhonchi  ABDOMEN: Soft, non-tender, non-distended EXTREMITIES:  No edema; No deformity    Wt Readings from Last 3 Encounters:  04/07/24 (!) 141.9 kg  03/31/24 (!) 142 kg  03/14/24 (!) 143.2 kg     EKG Interpretation Date/Time:  Thursday April 07 2024 09:36:12 EST Ventricular Rate:  64 PR Interval:  196 QRS Duration:  116 QT Interval:  492 QTC Calculation: 508 R Axis:   -29  Text Interpretation: Normal sinus rhythm Left ventricular hypertrophy with QRS widening ( R in aVL , Cornell product ) T wave abnormality, consider lateral ischemia Prolonged QT Abnormal ECG When compared with ECG of 31-Mar-2024 08:30, No significant change was found Confirmed by Charron Coultas ([id number]) on 04/07/2024 9:43:19 AM    Echo 06/26/23 demonstrated   1. Endsystolic left ventricular volume index is 83 ml/m sq. Left  ventricular ejection fraction, by estimation, is 40 to 45%. The left  ventricle has mildly decreased function. The left ventricle demonstrates  global hypokinesis. The left ventricular internal cavity size was severely dilated. There is mild eccentric left ventricular hypertrophy. Left ventricular diastolic parameters are consistent with Grade II diastolic dysfunction (pseudonormalization). Elevated left atrial pressure.   2. Right ventricular systolic function is normal. The right ventricular  size is normal. Tricuspid regurgitation signal is inadequate for assessing  PA pressure.   3. Left atrial size  was severely dilated.   4. Right atrial size was mildly dilated.   5. The mitral valve is normal in structure. Mild mitral valve  regurgitation. No evidence of mitral stenosis.   6. The aortic valve was  not well visualized. Aortic valve regurgitation  is moderate to severe. No aortic stenosis is present. Aortic regurgitation  PHT measures 307 msec.   7. The inferior vena cava is normal in size with greater than 50%  respiratory variability, suggesting right atrial pressure of 3 mmHg.   Comparison(s): A prior study was performed on 04/13/2023. No significant  change from prior study. Prior images reviewed side by side.    CHA2DS2-VASc Score = 4  The patient's score is based upon: CHF History: 1 HTN History: 1 Diabetes History: 0 Stroke History: 2 (remote infacrts on head CT) Vascular Disease History: 0 Age Score: 0 Gender Score: 0       ASSESSMENT AND PLAN: Persistent Atrial Fibrillation (ICD10:  I48.19) The patient's CHA2DS2-VASc score is 4, indicating a 4.8% annual risk of stroke.   Patient appears to be maintaining SR Continue amiodarone  200 mg daily Continue Eliquis  5 mg BID Continue carvedilol  12.5 mg BID  Secondary Hypercoagulable State (ICD10:  D68.69) The patient is at significant risk for stroke/thromboembolism based upon his CHA2DS2-VASc Score of 4.  Continue Apixaban  (Eliquis ). No bleeding issues.   High Risk Medication Monitoring (ICD 10: [id number]) Patient requires ongoing monitoring for anti-arrhythmic medication which has the potential to cause life threatening arrhythmias. Intervals on ECG acceptable for amiodarone  monitoring. Recent labs reviewed.   Obesity Body mass index is 46.21 kg/m.  Encouraged lifestyle modification  OSA  Encouraged nightly CPAP  Chronic HFrEF EF 40-45% GDMT per primary cardiology team Fluid status appears stable today  HTN Stable on current regimen   Follow up in the AF clinic in 6 months.    [name] PA-C [facility] [address] [place], [place] [address] [phone] "

## 2024-04-09 ENCOUNTER — Other Ambulatory Visit (HOSPITAL_COMMUNITY): Payer: Self-pay | Admitting: Family Medicine

## 2024-04-11 ENCOUNTER — Other Ambulatory Visit: Payer: Self-pay | Admitting: Internal Medicine

## 2024-04-26 ENCOUNTER — Other Ambulatory Visit (HOSPITAL_COMMUNITY): Payer: Self-pay | Admitting: Physician Assistant

## 2024-04-26 DIAGNOSIS — I5042 Chronic combined systolic (congestive) and diastolic (congestive) heart failure: Secondary | ICD-10-CM

## 2024-07-13 ENCOUNTER — Inpatient Hospital Stay

## 2024-07-13 ENCOUNTER — Inpatient Hospital Stay: Admitting: Oncology

## 2024-10-10 ENCOUNTER — Ambulatory Visit (HOSPITAL_COMMUNITY): Admitting: Physician Assistant
